# Patient Record
Sex: Female | Born: 1960 | Race: White | Hispanic: No | Marital: Married | Smoking: Never smoker
Health system: Southern US, Community
[De-identification: ages and names within clinical notes are randomized; demographics above are authoritative.]

## PROBLEM LIST (undated history)

## (undated) DIAGNOSIS — Z8679 Personal history of other diseases of the circulatory system: Secondary | ICD-10-CM

## (undated) DIAGNOSIS — G43909 Migraine, unspecified, not intractable, without status migrainosus: Secondary | ICD-10-CM

## (undated) DIAGNOSIS — E119 Type 2 diabetes mellitus without complications: Secondary | ICD-10-CM

## (undated) DIAGNOSIS — M2550 Pain in unspecified joint: Secondary | ICD-10-CM

## (undated) DIAGNOSIS — E3400 Carcinoid syndrome, unspecified: Secondary | ICD-10-CM

## (undated) DIAGNOSIS — H348192 Central retinal vein occlusion, unspecified eye, stable: Secondary | ICD-10-CM

## (undated) DIAGNOSIS — Z8619 Personal history of other infectious and parasitic diseases: Secondary | ICD-10-CM

## (undated) DIAGNOSIS — D490 Neoplasm of unspecified behavior of digestive system: Secondary | ICD-10-CM

## (undated) DIAGNOSIS — I951 Orthostatic hypotension: Secondary | ICD-10-CM

## (undated) DIAGNOSIS — M1990 Unspecified osteoarthritis, unspecified site: Secondary | ICD-10-CM

## (undated) DIAGNOSIS — H269 Unspecified cataract: Secondary | ICD-10-CM

## (undated) DIAGNOSIS — K7290 Hepatic failure, unspecified without coma: Secondary | ICD-10-CM

## (undated) DIAGNOSIS — M8580 Other specified disorders of bone density and structure, unspecified site: Secondary | ICD-10-CM

## (undated) DIAGNOSIS — K219 Gastro-esophageal reflux disease without esophagitis: Secondary | ICD-10-CM

## (undated) DIAGNOSIS — G4730 Sleep apnea, unspecified: Secondary | ICD-10-CM

## (undated) DIAGNOSIS — K829 Disease of gallbladder, unspecified: Secondary | ICD-10-CM

## (undated) DIAGNOSIS — K760 Fatty (change of) liver, not elsewhere classified: Secondary | ICD-10-CM

## (undated) DIAGNOSIS — F419 Anxiety disorder, unspecified: Secondary | ICD-10-CM

## (undated) DIAGNOSIS — K769 Liver disease, unspecified: Secondary | ICD-10-CM

## (undated) DIAGNOSIS — D3A8 Other benign neuroendocrine tumors: Secondary | ICD-10-CM

## (undated) DIAGNOSIS — N952 Postmenopausal atrophic vaginitis: Secondary | ICD-10-CM

## (undated) DIAGNOSIS — D869 Sarcoidosis, unspecified: Secondary | ICD-10-CM

## (undated) DIAGNOSIS — M549 Dorsalgia, unspecified: Secondary | ICD-10-CM

## (undated) DIAGNOSIS — K5900 Constipation, unspecified: Secondary | ICD-10-CM

## (undated) DIAGNOSIS — R7303 Prediabetes: Secondary | ICD-10-CM

## (undated) DIAGNOSIS — R0600 Dyspnea, unspecified: Secondary | ICD-10-CM

## (undated) DIAGNOSIS — I10 Essential (primary) hypertension: Secondary | ICD-10-CM

## (undated) DIAGNOSIS — E8940 Asymptomatic postprocedural ovarian failure: Secondary | ICD-10-CM

## (undated) DIAGNOSIS — R002 Palpitations: Secondary | ICD-10-CM

## (undated) HISTORY — DX: Neoplasm of unspecified behavior of digestive system: D49.0

## (undated) HISTORY — PX: NASAL SINUS SURGERY: SHX719

## (undated) HISTORY — DX: Sleep apnea, unspecified: G47.30

## (undated) HISTORY — DX: Dyspnea, unspecified: R06.00

## (undated) HISTORY — DX: Palpitations: R00.2

## (undated) HISTORY — DX: Personal history of other infectious and parasitic diseases: Z86.19

## (undated) HISTORY — PX: HERNIA REPAIR: SHX51

## (undated) HISTORY — PX: JOINT REPLACEMENT: SHX530

## (undated) HISTORY — PX: BREAST BIOPSY: SHX20

## (undated) HISTORY — DX: Dorsalgia, unspecified: M54.9

## (undated) HISTORY — PX: LAPAROSCOPIC HEPATECTOMY: SHX5897

## (undated) HISTORY — DX: Essential (primary) hypertension: I10

## (undated) HISTORY — DX: Fatty (change of) liver, not elsewhere classified: K76.0

## (undated) HISTORY — DX: Type 2 diabetes mellitus without complications: E11.9

## (undated) HISTORY — DX: Asymptomatic postprocedural ovarian failure: E89.40

## (undated) HISTORY — DX: Liver disease, unspecified: K76.9

## (undated) HISTORY — DX: Sarcoidosis, unspecified: D86.9

## (undated) HISTORY — DX: Personal history of other diseases of the circulatory system: Z86.79

## (undated) HISTORY — DX: Other specified disorders of bone density and structure, unspecified site: M85.80

## (undated) HISTORY — DX: Hepatic failure, unspecified without coma: K72.90

## (undated) HISTORY — DX: Central retinal vein occlusion, unspecified eye, stable: H34.8192

## (undated) HISTORY — DX: Other benign neuroendocrine tumors: D3A.8

## (undated) HISTORY — DX: Disease of gallbladder, unspecified: K82.9

## (undated) HISTORY — PX: LIVER SURGERY: SHX698

## (undated) HISTORY — DX: Pain in unspecified joint: M25.50

## (undated) HISTORY — PX: COSMETIC SURGERY: SHX468

## (undated) HISTORY — DX: Postmenopausal atrophic vaginitis: N95.2

## (undated) HISTORY — DX: Migraine, unspecified, not intractable, without status migrainosus: G43.909

## (undated) HISTORY — DX: Unspecified cataract: H26.9

## (undated) HISTORY — DX: Constipation, unspecified: K59.00

## (undated) HISTORY — DX: Orthostatic hypotension: I95.1

## (undated) HISTORY — PX: EYE SURGERY: SHX253

## (undated) HISTORY — PX: RECONSTRUCTION BREAST W/ LATISSIMUS DORSI FLAP: SUR1078

## (undated) HISTORY — DX: Prediabetes: R73.03

## (undated) HISTORY — PX: AUGMENTATION MAMMAPLASTY: SUR837

## (undated) HISTORY — DX: Unspecified osteoarthritis, unspecified site: M19.90

## (undated) HISTORY — DX: Gastro-esophageal reflux disease without esophagitis: K21.9

---
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
## 1997-04-21 ENCOUNTER — Ambulatory Visit (HOSPITAL_COMMUNITY): Admission: RE | Admit: 1997-04-21 | Discharge: 1997-04-21 | Payer: Self-pay | Admitting: Obstetrics and Gynecology

## 1997-05-18 ENCOUNTER — Other Ambulatory Visit: Admission: RE | Admit: 1997-05-18 | Discharge: 1997-05-18 | Payer: Self-pay | Admitting: Obstetrics & Gynecology

## 1997-06-12 ENCOUNTER — Inpatient Hospital Stay (HOSPITAL_COMMUNITY): Admission: AD | Admit: 1997-06-12 | Discharge: 1997-06-16 | Payer: Self-pay | Admitting: Obstetrics and Gynecology

## 1997-06-17 ENCOUNTER — Inpatient Hospital Stay (HOSPITAL_COMMUNITY): Admission: AD | Admit: 1997-06-17 | Discharge: 1997-06-17 | Payer: Self-pay | Admitting: Obstetrics and Gynecology

## 1997-06-17 ENCOUNTER — Encounter: Admission: RE | Admit: 1997-06-17 | Discharge: 1997-09-15 | Payer: Self-pay | Admitting: Obstetrics and Gynecology

## 1997-09-19 ENCOUNTER — Encounter (HOSPITAL_COMMUNITY): Admission: RE | Admit: 1997-09-19 | Discharge: 1997-12-18 | Payer: Self-pay | Admitting: Obstetrics and Gynecology

## 1997-12-14 ENCOUNTER — Other Ambulatory Visit: Admission: RE | Admit: 1997-12-14 | Discharge: 1997-12-14 | Payer: Self-pay | Admitting: Obstetrics and Gynecology

## 1999-02-11 HISTORY — PX: TUBAL LIGATION: SHX77

## 2000-07-05 ENCOUNTER — Emergency Department (HOSPITAL_COMMUNITY): Admission: EM | Admit: 2000-07-05 | Discharge: 2000-07-05 | Payer: Self-pay | Admitting: Emergency Medicine

## 2000-07-05 ENCOUNTER — Encounter: Payer: Self-pay | Admitting: Emergency Medicine

## 2002-02-10 HISTORY — PX: ABDOMINAL HYSTERECTOMY: SHX81

## 2002-02-10 HISTORY — PX: OOPHORECTOMY: SHX86

## 2004-02-11 HISTORY — PX: CHOLECYSTECTOMY: SHX55

## 2006-02-10 DIAGNOSIS — D869 Sarcoidosis, unspecified: Secondary | ICD-10-CM

## 2006-02-10 HISTORY — DX: Sarcoidosis, unspecified: D86.9

## 2006-02-10 HISTORY — PX: MASTECTOMY: SHX3

## 2008-07-11 ENCOUNTER — Encounter: Admission: RE | Admit: 2008-07-11 | Discharge: 2008-07-11 | Payer: Self-pay | Admitting: Rheumatology

## 2008-07-12 ENCOUNTER — Ambulatory Visit: Payer: Self-pay | Admitting: Genetic Counselor

## 2008-10-02 ENCOUNTER — Ambulatory Visit: Payer: Self-pay | Admitting: Internal Medicine

## 2008-10-15 ENCOUNTER — Inpatient Hospital Stay (HOSPITAL_COMMUNITY): Admission: EM | Admit: 2008-10-15 | Discharge: 2008-10-25 | Payer: Self-pay | Admitting: Emergency Medicine

## 2008-10-17 ENCOUNTER — Encounter (INDEPENDENT_AMBULATORY_CARE_PROVIDER_SITE_OTHER): Payer: Self-pay | Admitting: Interventional Radiology

## 2008-10-19 ENCOUNTER — Encounter (INDEPENDENT_AMBULATORY_CARE_PROVIDER_SITE_OTHER): Payer: Self-pay | Admitting: Internal Medicine

## 2008-10-20 ENCOUNTER — Ambulatory Visit: Payer: Self-pay | Admitting: Hematology & Oncology

## 2008-10-23 ENCOUNTER — Encounter: Payer: Self-pay | Admitting: Hematology & Oncology

## 2008-10-31 ENCOUNTER — Emergency Department (HOSPITAL_COMMUNITY): Admission: EM | Admit: 2008-10-31 | Discharge: 2008-10-31 | Payer: Self-pay | Admitting: Emergency Medicine

## 2008-11-02 ENCOUNTER — Ambulatory Visit: Payer: Self-pay | Admitting: Hematology & Oncology

## 2008-11-02 LAB — BASIC METABOLIC PANEL - CANCER CENTER ONLY
BUN, Bld: 14 mg/dL (ref 7–22)
CO2: 28 mEq/L (ref 18–33)
Chloride: 101 mEq/L (ref 98–108)
Creat: 1.5 mg/dl — ABNORMAL HIGH (ref 0.6–1.2)
Glucose, Bld: 101 mg/dL (ref 73–118)

## 2008-11-03 ENCOUNTER — Ambulatory Visit: Payer: Self-pay | Admitting: Internal Medicine

## 2008-11-03 ENCOUNTER — Ambulatory Visit (HOSPITAL_COMMUNITY): Admission: RE | Admit: 2008-11-03 | Discharge: 2008-11-03 | Payer: Self-pay | Admitting: Oncology

## 2008-11-09 LAB — CHROMOGRANIN A: Chromogranin A: 94 ng/mL — ABNORMAL HIGH (ref 1.9–15.0)

## 2008-11-15 ENCOUNTER — Emergency Department (HOSPITAL_COMMUNITY): Admission: EM | Admit: 2008-11-15 | Discharge: 2008-11-15 | Payer: Self-pay | Admitting: Emergency Medicine

## 2008-11-21 ENCOUNTER — Ambulatory Visit: Payer: Self-pay | Admitting: Internal Medicine

## 2008-11-29 ENCOUNTER — Emergency Department (HOSPITAL_COMMUNITY): Admission: EM | Admit: 2008-11-29 | Discharge: 2008-11-29 | Payer: Self-pay | Admitting: Emergency Medicine

## 2008-12-22 ENCOUNTER — Ambulatory Visit: Payer: Self-pay | Admitting: Hematology & Oncology

## 2008-12-25 LAB — CBC WITH DIFFERENTIAL (CANCER CENTER ONLY)
BASO#: 0 10*3/uL (ref 0.0–0.2)
EOS%: 3.4 % (ref 0.0–7.0)
Eosinophils Absolute: 0.2 10*3/uL (ref 0.0–0.5)
HGB: 11.1 g/dL — ABNORMAL LOW (ref 11.6–15.9)
LYMPH#: 1.8 10*3/uL (ref 0.9–3.3)
NEUT#: 3.3 10*3/uL (ref 1.5–6.5)
RBC: 3.72 10*6/uL (ref 3.70–5.32)

## 2008-12-28 ENCOUNTER — Ambulatory Visit: Payer: Self-pay | Admitting: Internal Medicine

## 2008-12-29 LAB — COMPREHENSIVE METABOLIC PANEL
ALT: 20 U/L (ref 0–35)
AST: 18 U/L (ref 0–37)
Albumin: 4 g/dL (ref 3.5–5.2)
CO2: 25 mEq/L (ref 19–32)
Calcium: 8.7 mg/dL (ref 8.4–10.5)
Chloride: 103 mEq/L (ref 96–112)
Potassium: 3 mEq/L — ABNORMAL LOW (ref 3.5–5.3)
Total Protein: 6.9 g/dL (ref 6.0–8.3)

## 2009-01-12 ENCOUNTER — Ambulatory Visit: Payer: Self-pay | Admitting: Internal Medicine

## 2009-01-25 ENCOUNTER — Ambulatory Visit: Payer: Self-pay | Admitting: Hematology & Oncology

## 2009-01-26 LAB — CBC WITH DIFFERENTIAL (CANCER CENTER ONLY)
BASO%: 0.6 % (ref 0.0–2.0)
EOS%: 2.4 % (ref 0.0–7.0)
LYMPH#: 2.2 10*3/uL (ref 0.9–3.3)
MCHC: 34.4 g/dL (ref 32.0–36.0)
NEUT#: 6.2 10*3/uL (ref 1.5–6.5)
RDW: 12.7 % (ref 10.5–14.6)

## 2009-01-26 LAB — COMPREHENSIVE METABOLIC PANEL
Alkaline Phosphatase: 89 U/L (ref 39–117)
CO2: 24 mEq/L (ref 19–32)
Creatinine, Ser: 0.79 mg/dL (ref 0.40–1.20)
Glucose, Bld: 97 mg/dL (ref 70–99)
Total Bilirubin: 0.2 mg/dL — ABNORMAL LOW (ref 0.3–1.2)

## 2009-02-01 LAB — CHROMOGRANIN A: Chromogranin A: 4.2 ng/mL (ref 1.9–15.0)

## 2009-02-10 DIAGNOSIS — K7682 Hepatic encephalopathy: Secondary | ICD-10-CM

## 2009-02-10 DIAGNOSIS — D3A8 Other benign neuroendocrine tumors: Secondary | ICD-10-CM

## 2009-02-10 DIAGNOSIS — K7290 Hepatic failure, unspecified without coma: Secondary | ICD-10-CM

## 2009-02-10 DIAGNOSIS — I10 Essential (primary) hypertension: Secondary | ICD-10-CM

## 2009-02-10 HISTORY — DX: Other benign neuroendocrine tumors: D3A.8

## 2009-02-10 HISTORY — DX: Hepatic encephalopathy: K76.82

## 2009-02-10 HISTORY — DX: Hepatic failure, unspecified without coma: K72.90

## 2009-02-10 HISTORY — DX: Essential (primary) hypertension: I10

## 2009-03-01 ENCOUNTER — Ambulatory Visit: Payer: Self-pay | Admitting: Hematology & Oncology

## 2009-03-02 LAB — CBC WITH DIFFERENTIAL (CANCER CENTER ONLY)
BASO#: 0 10*3/uL (ref 0.0–0.2)
BASO%: 0.5 % (ref 0.0–2.0)
HCT: 34.9 % (ref 34.8–46.6)
HGB: 12.1 g/dL (ref 11.6–15.9)
LYMPH#: 2.4 10*3/uL (ref 0.9–3.3)
LYMPH%: 31.3 % (ref 14.0–48.0)
MCHC: 34.6 g/dL (ref 32.0–36.0)
MCV: 86 fL (ref 81–101)
MONO#: 0.4 10*3/uL (ref 0.1–0.9)
NEUT%: 59.8 % (ref 39.6–80.0)
RDW: 12.4 % (ref 10.5–14.6)
WBC: 7.8 10*3/uL (ref 3.9–10.0)

## 2009-03-06 LAB — CHROMOGRANIN A: Chromogranin A: 4.8 ng/mL (ref 1.9–15.0)

## 2009-03-06 LAB — COMPREHENSIVE METABOLIC PANEL
ALT: 46 U/L — ABNORMAL HIGH (ref 0–35)
BUN: 14 mg/dL (ref 6–23)
CO2: 23 mEq/L (ref 19–32)
Calcium: 9 mg/dL (ref 8.4–10.5)
Chloride: 101 mEq/L (ref 96–112)
Creatinine, Ser: 0.79 mg/dL (ref 0.40–1.20)
Total Bilirubin: 0.3 mg/dL (ref 0.3–1.2)

## 2009-03-06 LAB — LACTATE DEHYDROGENASE: LDH: 199 U/L (ref 94–250)

## 2009-03-08 ENCOUNTER — Ambulatory Visit: Payer: Self-pay | Admitting: Internal Medicine

## 2009-04-24 ENCOUNTER — Ambulatory Visit: Payer: Self-pay | Admitting: Internal Medicine

## 2009-05-22 ENCOUNTER — Ambulatory Visit: Payer: Self-pay | Admitting: Internal Medicine

## 2009-10-09 ENCOUNTER — Other Ambulatory Visit: Admission: RE | Admit: 2009-10-09 | Discharge: 2009-10-09 | Payer: Self-pay | Admitting: Internal Medicine

## 2009-10-09 ENCOUNTER — Ambulatory Visit: Payer: Self-pay | Admitting: Internal Medicine

## 2009-11-05 ENCOUNTER — Ambulatory Visit: Payer: Self-pay | Admitting: Internal Medicine

## 2009-11-06 ENCOUNTER — Encounter: Admission: RE | Admit: 2009-11-06 | Discharge: 2009-11-06 | Payer: Self-pay | Admitting: Internal Medicine

## 2009-11-20 ENCOUNTER — Ambulatory Visit: Payer: Self-pay | Admitting: Internal Medicine

## 2009-11-30 ENCOUNTER — Ambulatory Visit: Payer: Self-pay | Admitting: Hematology & Oncology

## 2009-12-05 LAB — CHROMOGRANIN A: Chromogranin A: 44 ng/mL — ABNORMAL HIGH (ref 1.9–15.0)

## 2009-12-19 ENCOUNTER — Encounter (HOSPITAL_COMMUNITY)
Admission: RE | Admit: 2009-12-19 | Discharge: 2010-02-08 | Payer: Self-pay | Source: Home / Self Care | Attending: Hematology & Oncology | Admitting: Hematology & Oncology

## 2010-01-22 ENCOUNTER — Ambulatory Visit: Payer: Self-pay | Admitting: Hematology & Oncology

## 2010-01-23 LAB — CBC WITH DIFFERENTIAL (CANCER CENTER ONLY)
BASO#: 0 10*3/uL (ref 0.0–0.2)
BASO%: 0.5 % (ref 0.0–2.0)
Eosinophils Absolute: 0.3 10*3/uL (ref 0.0–0.5)
HCT: 35.1 % (ref 34.8–46.6)
HGB: 11.8 g/dL (ref 11.6–15.9)
LYMPH#: 1.8 10*3/uL (ref 0.9–3.3)
LYMPH%: 26.3 % (ref 14.0–48.0)
MCH: 28.9 pg (ref 26.0–34.0)
MCHC: 33.5 g/dL (ref 32.0–36.0)
MCV: 86 fL (ref 81–101)
MONO#: 0.4 10*3/uL (ref 0.1–0.9)
MONO%: 5.8 % (ref 0.0–13.0)
NEUT#: 4.4 10*3/uL (ref 1.5–6.5)
NEUT%: 62.9 % (ref 39.6–80.0)
Platelets: 210 10*3/uL (ref 145–400)
RBC: 4.07 10*6/uL (ref 3.70–5.32)
RDW: 13.5 % (ref 10.5–14.6)
WBC: 7 10*3/uL (ref 3.9–10.0)

## 2010-01-26 LAB — COMPREHENSIVE METABOLIC PANEL
ALT: 35 U/L (ref 0–35)
AST: 37 U/L (ref 0–37)
Albumin: 3.7 g/dL (ref 3.5–5.2)
Alkaline Phosphatase: 102 U/L (ref 39–117)
BUN: 15 mg/dL (ref 6–23)
CO2: 27 mEq/L (ref 19–32)
Calcium: 8.9 mg/dL (ref 8.4–10.5)
Chloride: 108 mEq/L (ref 96–112)
Creatinine, Ser: 0.67 mg/dL (ref 0.40–1.20)
Glucose, Bld: 155 mg/dL — ABNORMAL HIGH (ref 70–99)
Potassium: 4 mEq/L (ref 3.5–5.3)
Sodium: 141 mEq/L (ref 135–145)
Total Bilirubin: 0.3 mg/dL (ref 0.3–1.2)
Total Protein: 6.8 g/dL (ref 6.0–8.3)

## 2010-01-26 LAB — CHROMOGRANIN A: Chromogranin A: 6.8 ng/mL (ref 1.9–15.0)

## 2010-03-03 ENCOUNTER — Encounter: Payer: Self-pay | Admitting: Internal Medicine

## 2010-03-15 ENCOUNTER — Encounter (HOSPITAL_BASED_OUTPATIENT_CLINIC_OR_DEPARTMENT_OTHER): Payer: BC Managed Care – PPO | Admitting: Hematology & Oncology

## 2010-03-15 DIAGNOSIS — C7A00 Malignant carcinoid tumor of unspecified site: Secondary | ICD-10-CM

## 2010-03-15 DIAGNOSIS — C787 Secondary malignant neoplasm of liver and intrahepatic bile duct: Secondary | ICD-10-CM

## 2010-03-20 ENCOUNTER — Encounter (HOSPITAL_BASED_OUTPATIENT_CLINIC_OR_DEPARTMENT_OTHER): Payer: BC Managed Care – PPO | Admitting: Hematology & Oncology

## 2010-03-20 ENCOUNTER — Other Ambulatory Visit: Payer: Self-pay | Admitting: Hematology & Oncology

## 2010-03-20 DIAGNOSIS — C7A00 Malignant carcinoid tumor of unspecified site: Secondary | ICD-10-CM

## 2010-03-20 DIAGNOSIS — C787 Secondary malignant neoplasm of liver and intrahepatic bile duct: Secondary | ICD-10-CM

## 2010-03-20 DIAGNOSIS — C7A098 Malignant carcinoid tumors of other sites: Secondary | ICD-10-CM

## 2010-03-20 DIAGNOSIS — C254 Malignant neoplasm of endocrine pancreas: Secondary | ICD-10-CM

## 2010-03-20 LAB — CBC WITH DIFFERENTIAL (CANCER CENTER ONLY)
BASO#: 0 10*3/uL (ref 0.0–0.2)
BASO%: 0.4 % (ref 0.0–2.0)
EOS%: 2.5 % (ref 0.0–7.0)
LYMPH#: 1.9 10*3/uL (ref 0.9–3.3)
MCH: 29.2 pg (ref 26.0–34.0)
MCHC: 33.2 g/dL (ref 32.0–36.0)
MONO%: 5.7 % (ref 0.0–13.0)
NEUT#: 4.5 10*3/uL (ref 1.5–6.5)
Platelets: 210 10*3/uL (ref 145–400)
RDW: 13.2 % (ref 10.5–14.6)

## 2010-03-24 LAB — COMPREHENSIVE METABOLIC PANEL
ALT: 31 U/L (ref 0–35)
AST: 32 U/L (ref 0–37)
Albumin: 3.8 g/dL (ref 3.5–5.2)
Alkaline Phosphatase: 104 U/L (ref 39–117)
BUN: 11 mg/dL (ref 6–23)
Chloride: 104 mEq/L (ref 96–112)
Potassium: 3.7 mEq/L (ref 3.5–5.3)

## 2010-03-24 LAB — CHROMOGRANIN A: Chromogranin A: 2.8 ng/mL (ref 1.9–15.0)

## 2010-04-09 ENCOUNTER — Ambulatory Visit (INDEPENDENT_AMBULATORY_CARE_PROVIDER_SITE_OTHER): Payer: BC Managed Care – PPO | Admitting: Internal Medicine

## 2010-04-09 DIAGNOSIS — J069 Acute upper respiratory infection, unspecified: Secondary | ICD-10-CM

## 2010-04-11 ENCOUNTER — Encounter (HOSPITAL_BASED_OUTPATIENT_CLINIC_OR_DEPARTMENT_OTHER): Payer: BC Managed Care – PPO | Admitting: Hematology & Oncology

## 2010-04-11 DIAGNOSIS — C787 Secondary malignant neoplasm of liver and intrahepatic bile duct: Secondary | ICD-10-CM

## 2010-04-11 DIAGNOSIS — C7A00 Malignant carcinoid tumor of unspecified site: Secondary | ICD-10-CM

## 2010-05-15 ENCOUNTER — Other Ambulatory Visit: Payer: Self-pay | Admitting: Hematology & Oncology

## 2010-05-15 ENCOUNTER — Encounter (HOSPITAL_BASED_OUTPATIENT_CLINIC_OR_DEPARTMENT_OTHER): Payer: BC Managed Care – PPO | Admitting: Hematology & Oncology

## 2010-05-15 DIAGNOSIS — C787 Secondary malignant neoplasm of liver and intrahepatic bile duct: Secondary | ICD-10-CM

## 2010-05-15 DIAGNOSIS — C7A00 Malignant carcinoid tumor of unspecified site: Secondary | ICD-10-CM

## 2010-05-15 LAB — CBC WITH DIFFERENTIAL (CANCER CENTER ONLY)
Eosinophils Absolute: 0.1 10*3/uL (ref 0.0–0.5)
LYMPH#: 2.2 10*3/uL (ref 0.9–3.3)
MONO#: 0.5 10*3/uL (ref 0.1–0.9)
MONO%: 7.2 % (ref 0.0–13.0)
NEUT#: 4.2 10*3/uL (ref 1.5–6.5)
Platelets: 216 10*3/uL (ref 145–400)
RBC: 4.27 10*6/uL (ref 3.70–5.32)
WBC: 7.1 10*3/uL (ref 3.9–10.0)

## 2010-05-16 LAB — COMPREHENSIVE METABOLIC PANEL
AST: 23 U/L (ref 0–37)
Albumin: 3.9 g/dL (ref 3.5–5.2)
CO2: 28 mEq/L (ref 19–32)
Calcium: 9.5 mg/dL (ref 8.4–10.5)
Creatinine, Ser: 1.1 mg/dL (ref 0.4–1.2)
GFR calc Af Amer: >60 mL/min (ref >60)
GFR calc non Af Amer: 53 mL/min — ABNORMAL LOW (ref >60)
Total Protein: 8.1 g/dL (ref 6.0–8.3)

## 2010-05-16 LAB — URINALYSIS, ROUTINE W REFLEX MICROSCOPIC
Bilirubin Urine: NEGATIVE
Bilirubin Urine: NEGATIVE
Glucose, UA: NEGATIVE mg/dL
Glucose, UA: NEGATIVE mg/dL
Nitrite: NEGATIVE
Nitrite: NEGATIVE
Protein, ur: 100 mg/dL — AB
Protein, ur: NEGATIVE mg/dL
Specific Gravity, Urine: 1.017 (ref 1.005–1.030)
Urobilinogen, UA: 0.2 mg/dL (ref 0.0–1.0)

## 2010-05-16 LAB — CBC
HCT: 33.2 % — ABNORMAL LOW (ref 36.0–46.0)
Hemoglobin: 11.2 g/dL — ABNORMAL LOW (ref 12.0–15.0)
MCHC: 33.9 g/dL (ref 30.0–36.0)
MCV: 88.3 fL (ref 78.0–100.0)
Platelets: 365 10*3/uL (ref 150–400)
RBC: 3.75 MIL/uL — ABNORMAL LOW (ref 3.87–5.11)
RDW: 14.7 % (ref 11.5–15.5)
WBC: 10.2 10*3/uL (ref 4.0–10.5)

## 2010-05-16 LAB — URINE MICROSCOPIC-ADD ON

## 2010-05-16 LAB — DIFFERENTIAL
Basophils Relative: 0 % (ref 0–1)
Eosinophils Relative: 5 % (ref 0–5)
Lymphocytes Relative: 27 % (ref 12–46)
Lymphs Abs: 2.7 10*3/uL (ref 0.7–4.0)
Monocytes Relative: 6 % (ref 3–12)

## 2010-05-16 LAB — URINE CULTURE

## 2010-05-17 LAB — DIFFERENTIAL
Basophils Relative: 1 % (ref 0–1)
Basophils Relative: 0 % (ref 0–1)
Basophils Relative: 0 % (ref 0–1)
Basophils Relative: 0 % (ref 0–1)
Eosinophils Absolute: 0.1 10*3/uL (ref 0.0–0.7)
Eosinophils Absolute: 0 10*3/uL (ref 0.0–0.7)
Eosinophils Absolute: 0.1 10*3/uL (ref 0.0–0.7)
Eosinophils Absolute: 0.3 10*3/uL (ref 0.0–0.7)
Eosinophils Relative: 0 % (ref 0–5)
Eosinophils Relative: 0 % (ref 0–5)
Eosinophils Relative: 1 % (ref 0–5)
Lymphocytes Relative: 6 % — ABNORMAL LOW (ref 12–46)
Lymphocytes Relative: 14 % (ref 12–46)
Lymphocytes Relative: 16 % (ref 12–46)
Lymphocytes Relative: 19 % (ref 12–46)
Lymphs Abs: 1.4 10*3/uL (ref 0.7–4.0)
Lymphs Abs: 1.4 10*3/uL (ref 0.7–4.0)
Lymphs Abs: 2.6 10*3/uL (ref 0.7–4.0)
Monocytes Absolute: 0.8 10*3/uL (ref 0.1–1.0)
Monocytes Absolute: 0.9 10*3/uL (ref 0.1–1.0)
Monocytes Relative: 3 % (ref 3–12)
Monocytes Relative: 10 % (ref 3–12)
Monocytes Relative: 7 % (ref 3–12)
Neutrophils Relative %: 90 % — ABNORMAL HIGH (ref 43–77)
Neutrophils Relative %: 72 % (ref 43–77)
Neutrophils Relative %: 74 % (ref 43–77)
Neutrophils Relative %: 75 % (ref 43–77)

## 2010-05-17 LAB — COMPREHENSIVE METABOLIC PANEL
ALT: 27 U/L (ref 0–35)
ALT: 34 U/L (ref 0–35)
ALT: 44 U/L — ABNORMAL HIGH (ref 0–35)
AST: 30 U/L (ref 0–37)
AST: 32 U/L (ref 0–37)
AST: 47 U/L — ABNORMAL HIGH (ref 0–37)
Albumin: 2.4 g/dL — ABNORMAL LOW (ref 3.5–5.2)
Albumin: 3.3 g/dL — ABNORMAL LOW (ref 3.5–5.2)
Alkaline Phosphatase: 120 U/L — ABNORMAL HIGH (ref 39–117)
CO2: 24 mEq/L (ref 19–32)
CO2: 29 mEq/L (ref 19–32)
Calcium: 7.8 mg/dL — ABNORMAL LOW (ref 8.4–10.5)
Chloride: 102 mEq/L (ref 96–112)
Creatinine, Ser: 0.77 mg/dL (ref 0.4–1.2)
Creatinine, Ser: 2.26 mg/dL — ABNORMAL HIGH (ref 0.4–1.2)
GFR calc Af Amer: 28 mL/min — ABNORMAL LOW (ref >60)
GFR calc Af Amer: >60 mL/min (ref >60)
GFR calc Af Amer: >60 mL/min (ref >60)
GFR calc non Af Amer: 23 mL/min — ABNORMAL LOW (ref >60)
GFR calc non Af Amer: >60 mL/min (ref >60)
Glucose, Bld: 100 mg/dL — ABNORMAL HIGH (ref 70–99)
Potassium: 3.1 mEq/L — ABNORMAL LOW (ref 3.5–5.1)
Sodium: 135 mEq/L (ref 135–145)
Sodium: 136 mEq/L (ref 135–145)
Sodium: 139 mEq/L (ref 135–145)
Total Bilirubin: 1.5 mg/dL — ABNORMAL HIGH (ref 0.3–1.2)
Total Protein: 5.9 g/dL — ABNORMAL LOW (ref 6.0–8.3)
Total Protein: 7.6 g/dL (ref 6.0–8.3)

## 2010-05-17 LAB — CBC
HCT: 32.6 % — ABNORMAL LOW (ref 36.0–46.0)
HCT: 23.5 % — ABNORMAL LOW (ref 36.0–46.0)
HCT: 24.4 % — ABNORMAL LOW (ref 36.0–46.0)
HCT: 24.6 % — ABNORMAL LOW (ref 36.0–46.0)
HCT: 24.9 % — ABNORMAL LOW (ref 36.0–46.0)
HCT: 25.3 % — ABNORMAL LOW (ref 36.0–46.0)
HCT: 27.7 % — ABNORMAL LOW (ref 36.0–46.0)
HCT: 28.2 % — ABNORMAL LOW (ref 36.0–46.0)
HCT: 30.5 % — ABNORMAL LOW (ref 36.0–46.0)
HCT: 33.8 % — ABNORMAL LOW (ref 36.0–46.0)
Hemoglobin: 11.2 g/dL — ABNORMAL LOW (ref 12.0–15.0)
Hemoglobin: 10.8 g/dL — ABNORMAL LOW (ref 12.0–15.0)
Hemoglobin: 11.6 g/dL — ABNORMAL LOW (ref 12.0–15.0)
Hemoglobin: 8.1 g/dL — ABNORMAL LOW (ref 12.0–15.0)
Hemoglobin: 8.5 g/dL — ABNORMAL LOW (ref 12.0–15.0)
Hemoglobin: 8.5 g/dL — ABNORMAL LOW (ref 12.0–15.0)
Hemoglobin: 8.5 g/dL — ABNORMAL LOW (ref 12.0–15.0)
Hemoglobin: 8.6 g/dL — ABNORMAL LOW (ref 12.0–15.0)
Hemoglobin: 9.4 g/dL — ABNORMAL LOW (ref 12.0–15.0)
Hemoglobin: 9.8 g/dL — ABNORMAL LOW (ref 12.0–15.0)
MCHC: 34.3 g/dL (ref 30.0–36.0)
MCHC: 34.1 g/dL (ref 30.0–36.0)
MCHC: 34.2 g/dL (ref 30.0–36.0)
MCHC: 34.2 g/dL (ref 30.0–36.0)
MCHC: 34.4 g/dL (ref 30.0–36.0)
MCHC: 34.4 g/dL (ref 30.0–36.0)
MCHC: 34.7 g/dL (ref 30.0–36.0)
MCHC: 34.8 g/dL (ref 30.0–36.0)
MCHC: 34.9 g/dL (ref 30.0–36.0)
MCHC: 35.3 g/dL (ref 30.0–36.0)
MCV: 87.8 fL (ref 78.0–100.0)
MCV: 89.6 fL (ref 78.0–100.0)
MCV: 90.1 fL (ref 78.0–100.0)
MCV: 90.3 fL (ref 78.0–100.0)
MCV: 90.6 fL (ref 78.0–100.0)
MCV: 90.8 fL (ref 78.0–100.0)
MCV: 90.9 fL (ref 78.0–100.0)
MCV: 91.1 fL (ref 78.0–100.0)
MCV: 91.2 fL (ref 78.0–100.0)
MCV: 91.9 fL (ref 78.0–100.0)
Platelets: 355 10*3/uL (ref 150–400)
Platelets: 211 10*3/uL (ref 150–400)
Platelets: 214 10*3/uL (ref 150–400)
Platelets: 229 10*3/uL (ref 150–400)
Platelets: 252 10*3/uL (ref 150–400)
Platelets: 268 10*3/uL (ref 150–400)
Platelets: 269 10*3/uL (ref 150–400)
Platelets: 271 10*3/uL (ref 150–400)
Platelets: 388 10*3/uL (ref 150–400)
RBC: 3.71 MIL/uL — ABNORMAL LOW (ref 3.87–5.11)
RBC: 2.6 MIL/uL — ABNORMAL LOW (ref 3.87–5.11)
RBC: 2.69 MIL/uL — ABNORMAL LOW (ref 3.87–5.11)
RBC: 2.71 MIL/uL — ABNORMAL LOW (ref 3.87–5.11)
RBC: 2.77 MIL/uL — ABNORMAL LOW (ref 3.87–5.11)
RBC: 2.78 MIL/uL — ABNORMAL LOW (ref 3.87–5.11)
RBC: 3.01 MIL/uL — ABNORMAL LOW (ref 3.87–5.11)
RBC: 3.09 MIL/uL — ABNORMAL LOW (ref 3.87–5.11)
RBC: 3.37 MIL/uL — ABNORMAL LOW (ref 3.87–5.11)
RBC: 3.72 MIL/uL — ABNORMAL LOW (ref 3.87–5.11)
RDW: 14 % (ref 11.5–15.5)
RDW: 13.8 % (ref 11.5–15.5)
RDW: 13.8 % (ref 11.5–15.5)
RDW: 13.9 % (ref 11.5–15.5)
RDW: 13.9 % (ref 11.5–15.5)
RDW: 13.9 % (ref 11.5–15.5)
RDW: 14.1 % (ref 11.5–15.5)
RDW: 14.3 % (ref 11.5–15.5)
RDW: 14.3 % (ref 11.5–15.5)
RDW: 14.5 % (ref 11.5–15.5)
WBC: 22.5 10*3/uL — ABNORMAL HIGH (ref 4.0–10.5)
WBC: 10 10*3/uL (ref 4.0–10.5)
WBC: 11.7 10*3/uL — ABNORMAL HIGH (ref 4.0–10.5)
WBC: 13.9 10*3/uL — ABNORMAL HIGH (ref 4.0–10.5)
WBC: 6.5 10*3/uL (ref 4.0–10.5)
WBC: 7.7 10*3/uL (ref 4.0–10.5)
WBC: 8.6 10*3/uL (ref 4.0–10.5)
WBC: 9.1 10*3/uL (ref 4.0–10.5)
WBC: 9.6 10*3/uL (ref 4.0–10.5)

## 2010-05-17 LAB — URINALYSIS, ROUTINE W REFLEX MICROSCOPIC
Bilirubin Urine: NEGATIVE
Bilirubin Urine: NEGATIVE
Glucose, UA: NEGATIVE mg/dL
Ketones, ur: NEGATIVE mg/dL
Ketones, ur: NEGATIVE mg/dL
Leukocytes, UA: NEGATIVE
Leukocytes, UA: NEGATIVE
Nitrite: NEGATIVE
Nitrite: NEGATIVE
Nitrite: NEGATIVE
Protein, ur: 30 mg/dL — AB
Protein, ur: NEGATIVE mg/dL
Protein, ur: NEGATIVE mg/dL
Specific Gravity, Urine: 1.008 (ref 1.005–1.030)
Urobilinogen, UA: 0.2 mg/dL (ref 0.0–1.0)
Urobilinogen, UA: 0.2 mg/dL (ref 0.0–1.0)

## 2010-05-17 LAB — CLOSTRIDIUM DIFFICILE EIA

## 2010-05-17 LAB — BASIC METABOLIC PANEL
BUN: 11 mg/dL (ref 6–23)
BUN: 11 mg/dL (ref 6–23)
BUN: 12 mg/dL (ref 6–23)
BUN: 13 mg/dL (ref 6–23)
CO2: 24 mEq/L (ref 19–32)
CO2: 20 mEq/L (ref 19–32)
CO2: 22 mEq/L (ref 19–32)
CO2: 24 mEq/L (ref 19–32)
CO2: 25 mEq/L (ref 19–32)
Chloride: 99 mEq/L (ref 96–112)
Chloride: 105 mEq/L (ref 96–112)
Chloride: 107 mEq/L (ref 96–112)
Chloride: 109 mEq/L (ref 96–112)
Chloride: 110 mEq/L (ref 96–112)
Creatinine, Ser: 1.65 mg/dL — ABNORMAL HIGH (ref 0.4–1.2)
Creatinine, Ser: 1.99 mg/dL — ABNORMAL HIGH (ref 0.4–1.2)
GFR calc Af Amer: 40 mL/min — ABNORMAL LOW (ref >60)
GFR calc Af Amer: 24 mL/min — ABNORMAL LOW (ref >60)
GFR calc Af Amer: 29 mL/min — ABNORMAL LOW (ref >60)
GFR calc non Af Amer: 24 mL/min — ABNORMAL LOW (ref >60)
GFR calc non Af Amer: 26 mL/min — ABNORMAL LOW (ref >60)
Glucose, Bld: 129 mg/dL — ABNORMAL HIGH (ref 70–99)
Glucose, Bld: 80 mg/dL (ref 70–99)
Glucose, Bld: 81 mg/dL (ref 70–99)
Glucose, Bld: 85 mg/dL (ref 70–99)
Glucose, Bld: 88 mg/dL (ref 70–99)
Glucose, Bld: 94 mg/dL (ref 70–99)
Potassium: 3.2 mEq/L — ABNORMAL LOW (ref 3.5–5.1)
Potassium: 3.6 mEq/L (ref 3.5–5.1)
Potassium: 3.9 mEq/L (ref 3.5–5.1)
Potassium: 4.3 mEq/L (ref 3.5–5.1)
Potassium: 4.6 mEq/L (ref 3.5–5.1)
Sodium: 136 mEq/L (ref 135–145)
Sodium: 136 mEq/L (ref 135–145)
Sodium: 138 mEq/L (ref 135–145)

## 2010-05-17 LAB — CULTURE, BLOOD (ROUTINE X 2)

## 2010-05-17 LAB — MAGNESIUM: Magnesium: 1.8 mg/dL (ref 1.5–2.5)

## 2010-05-17 LAB — HEMOGLOBIN AND HEMATOCRIT, BLOOD
HCT: 25.3 % — ABNORMAL LOW (ref 36.0–46.0)
Hemoglobin: 8.7 g/dL — ABNORMAL LOW (ref 12.0–15.0)

## 2010-05-17 LAB — HEMOCCULT GUIAC POC 1CARD (OFFICE): Fecal Occult Bld: NEGATIVE

## 2010-05-17 LAB — PROTIME-INR
INR: 1.2 (ref 0.00–1.49)
Prothrombin Time: 15.1 seconds (ref 11.6–15.2)

## 2010-05-17 LAB — RENAL FUNCTION PANEL
Albumin: 2.2 g/dL — ABNORMAL LOW (ref 3.5–5.2)
BUN: 13 mg/dL (ref 6–23)
CO2: 22 mEq/L (ref 19–32)
Calcium: 8 mg/dL — ABNORMAL LOW (ref 8.4–10.5)
Calcium: 8.2 mg/dL — ABNORMAL LOW (ref 8.4–10.5)
Creatinine, Ser: 2.03 mg/dL — ABNORMAL HIGH (ref 0.4–1.2)
GFR calc Af Amer: 23 mL/min — ABNORMAL LOW (ref >60)
GFR calc Af Amer: 31 mL/min — ABNORMAL LOW (ref >60)
GFR calc non Af Amer: 19 mL/min — ABNORMAL LOW (ref >60)
GFR calc non Af Amer: 26 mL/min — ABNORMAL LOW (ref >60)
Phosphorus: 3.2 mg/dL (ref 2.3–4.6)
Sodium: 135 mEq/L (ref 135–145)

## 2010-05-17 LAB — URINE MICROSCOPIC-ADD ON

## 2010-05-17 LAB — STOOL CULTURE

## 2010-05-17 LAB — CEA: CEA: 0.8 ng/mL (ref 0.0–5.0)

## 2010-05-17 LAB — URINE CULTURE
Colony Count: NO GROWTH
Colony Count: NO GROWTH
Culture: NO GROWTH
Special Requests: POSITIVE

## 2010-05-17 LAB — APTT: aPTT: 40 seconds — ABNORMAL HIGH (ref 24–37)

## 2010-05-17 LAB — IRON AND TIBC: UIBC: 156 ug/dL

## 2010-05-17 LAB — FERRITIN: Ferritin: 382 ng/mL — ABNORMAL HIGH (ref 10–291)

## 2010-05-17 LAB — OVA AND PARASITE EXAMINATION

## 2010-05-18 LAB — COMPREHENSIVE METABOLIC PANEL
ALT: 32 U/L (ref 0–35)
AST: 24 U/L (ref 0–37)
BUN: 17 mg/dL (ref 6–23)
CO2: 25 mEq/L (ref 19–32)
Calcium: 9 mg/dL (ref 8.4–10.5)
Chloride: 105 mEq/L (ref 96–112)
Creatinine, Ser: 0.89 mg/dL (ref 0.40–1.20)
Total Bilirubin: 0.5 mg/dL (ref 0.3–1.2)

## 2010-05-18 LAB — CHROMOGRANIN A: Chromogranin A: 7.8 ng/mL (ref 1.9–15.0)

## 2010-05-18 LAB — LACTATE DEHYDROGENASE: LDH: 198 U/L (ref 94–250)

## 2010-05-23 ENCOUNTER — Ambulatory Visit: Payer: Self-pay | Admitting: Internal Medicine

## 2010-06-12 ENCOUNTER — Encounter (HOSPITAL_BASED_OUTPATIENT_CLINIC_OR_DEPARTMENT_OTHER): Payer: BC Managed Care – PPO | Admitting: Hematology & Oncology

## 2010-06-12 DIAGNOSIS — C7A00 Malignant carcinoid tumor of unspecified site: Secondary | ICD-10-CM

## 2010-06-12 DIAGNOSIS — C787 Secondary malignant neoplasm of liver and intrahepatic bile duct: Secondary | ICD-10-CM

## 2010-06-22 ENCOUNTER — Emergency Department (HOSPITAL_BASED_OUTPATIENT_CLINIC_OR_DEPARTMENT_OTHER)
Admission: EM | Admit: 2010-06-22 | Discharge: 2010-06-22 | Disposition: A | Discharge status: Home / Self Care | Payer: BC Managed Care – PPO | Attending: Emergency Medicine | Admitting: Emergency Medicine

## 2010-06-22 DIAGNOSIS — F341 Dysthymic disorder: Secondary | ICD-10-CM | POA: Insufficient documentation

## 2010-06-22 DIAGNOSIS — I10 Essential (primary) hypertension: Secondary | ICD-10-CM | POA: Insufficient documentation

## 2010-06-22 DIAGNOSIS — K219 Gastro-esophageal reflux disease without esophagitis: Secondary | ICD-10-CM | POA: Insufficient documentation

## 2010-06-22 DIAGNOSIS — L089 Local infection of the skin and subcutaneous tissue, unspecified: Secondary | ICD-10-CM | POA: Insufficient documentation

## 2010-06-24 ENCOUNTER — Ambulatory Visit (INDEPENDENT_AMBULATORY_CARE_PROVIDER_SITE_OTHER): Payer: BC Managed Care – PPO | Admitting: Internal Medicine

## 2010-06-24 ENCOUNTER — Encounter: Payer: Self-pay | Admitting: Internal Medicine

## 2010-06-24 VITALS — BP 130/78 | HR 82 | Temp 98.4°F

## 2010-06-24 DIAGNOSIS — L259 Unspecified contact dermatitis, unspecified cause: Secondary | ICD-10-CM

## 2010-06-24 DIAGNOSIS — L309 Dermatitis, unspecified: Secondary | ICD-10-CM

## 2010-06-24 MED ORDER — TRIAMCINOLONE ACETONIDE 0.1 % EX CREA: TOPICAL_CREAM | Freq: Three times a day (TID) | CUTANEOUS | Status: DC

## 2010-06-24 NOTE — Patient Instructions (Signed)
Use cream as directed. Call if other symptoms develop. Stay well hydrated. If knee pain does not improve, see orthopedist

## 2010-06-24 NOTE — Progress Notes (Signed)
  Subjective:    Patient ID: Leah [name], female    DOB: 1961/01/16, 50 y.o.   MRN: [id number]  HPI  Micah Flesher to [facility] with raised red area on left lower back achy then itchy. Hx of sarcoidosis. Apparently had hard lump that increased in size over 24 hours. [facility] dx of insect bite or cellulitis. Started on Doxycycline which she took for 24 hours but by yesterday was nauseated. Went to [place] to visit someone in hospital and did not feel wel.l Went to ER at [facility]. Husband thought she irritable and fatigued. 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 Pt had lab work at [facility] including CBC, Cmet, PT, PTT, U/A and Ammonia level. Ammonia level was slightly high at 61. Pt was given IVF and observed over several hours.  Felt better. Doxycycline has been d/ced since evening of May 12. Feels better today. Liver functions were normal yesterday. Hx of sarcoidosis, HTN,GE reflux.    Review of Systems  Constitutional: Positive for appetite change.  HENT: Negative for neck stiffness.   Respiratory: Negative for cough, chest tightness, shortness of breath and wheezing.   Musculoskeletal: Positive for joint swelling.  Skin: Positive for rash.  Neurological: Negative for dizziness, syncope, speech difficulty, light-headedness and headaches.  Psychiatric/Behavioral: Negative for hallucinations, behavioral problems, confusion, dysphoric mood and agitation.       Objective:   Physical Exam  Constitutional: She appears well-nourished. No distress.  Musculoskeletal:       Generalized tenderness over left knee, no erythema, hursa to bear weight walking with cane.  Skin: Rash noted.       Itchy excoriated macular erythematous rash on left lower lateral back. No induration and no drainage.  Psychiatric: Her behavior is normal. Judgment and thought content normal. Her speech is rapid and/or pressured. Cognition  and memory are normal.          Assessment & Plan:  1- Left knee strian- likely to resolve with rest, ice and elevation. Xray done yesterday at [facility] negative. 2-Rash left lateral back looks like eczema but pt thinks it is a flare of Sarcoidosis. Will treat with Triamcinolone Cream 0.1% tid  For 7 days and if no improvement to dermatologist 3- Elevated ammonia level- cannot find previous ammonia level in old records at [facility], [facility] hospital or in this office. Affect is normal no scleral icterus. Will observe for now. Has appt next month at [facility] to see Dr. Flonnie Hailstone, surgeon who did her liver surgery. 4- HTN stable

## 2010-07-05 ENCOUNTER — Encounter (HOSPITAL_BASED_OUTPATIENT_CLINIC_OR_DEPARTMENT_OTHER): Payer: BC Managed Care – PPO | Admitting: Hematology & Oncology

## 2010-07-05 DIAGNOSIS — C787 Secondary malignant neoplasm of liver and intrahepatic bile duct: Secondary | ICD-10-CM

## 2010-07-05 DIAGNOSIS — C7A00 Malignant carcinoid tumor of unspecified site: Secondary | ICD-10-CM

## 2010-07-24 ENCOUNTER — Telehealth: Payer: Self-pay | Admitting: Internal Medicine

## 2010-07-24 NOTE — Telephone Encounter (Signed)
Written Rx to fax Effexor XR 150 mg #30 prn 1 year refill

## 2010-07-26 ENCOUNTER — Ambulatory Visit: Payer: BC Managed Care – PPO | Admitting: Internal Medicine

## 2010-07-31 ENCOUNTER — Other Ambulatory Visit: Payer: Self-pay | Admitting: Hematology & Oncology

## 2010-07-31 ENCOUNTER — Encounter (HOSPITAL_BASED_OUTPATIENT_CLINIC_OR_DEPARTMENT_OTHER): Payer: BC Managed Care – PPO | Admitting: Hematology & Oncology

## 2010-07-31 DIAGNOSIS — C787 Secondary malignant neoplasm of liver and intrahepatic bile duct: Secondary | ICD-10-CM

## 2010-07-31 DIAGNOSIS — C7A00 Malignant carcinoid tumor of unspecified site: Secondary | ICD-10-CM

## 2010-07-31 LAB — CBC WITH DIFFERENTIAL (CANCER CENTER ONLY)
BASO%: 0.3 % (ref 0.0–2.0)
Eosinophils Absolute: 0.2 10*3/uL (ref 0.0–0.5)
LYMPH#: 2.6 10*3/uL (ref 0.9–3.3)
LYMPH%: 29.7 % (ref 14.0–48.0)
MCV: 90 fL (ref 81–101)
MONO#: 0.6 10*3/uL (ref 0.1–0.9)
Platelets: 222 10*3/uL (ref 145–400)
RBC: 4.19 10*6/uL (ref 3.70–5.32)
RDW: 14 % (ref 11.1–15.7)
WBC: 8.9 10*3/uL (ref 3.9–10.0)

## 2010-08-06 LAB — COMPREHENSIVE METABOLIC PANEL
ALT: 26 U/L (ref 0–35)
Albumin: 4.3 g/dL (ref 3.5–5.2)
CO2: 26 mEq/L (ref 19–32)
Calcium: 8.8 mg/dL (ref 8.4–10.5)
Chloride: 103 mEq/L (ref 96–112)
Glucose, Bld: 139 mg/dL — ABNORMAL HIGH (ref 70–99)
Potassium: 3.6 mEq/L (ref 3.5–5.3)
Sodium: 139 mEq/L (ref 135–145)
Total Protein: 7.2 g/dL (ref 6.0–8.3)

## 2010-08-06 LAB — LACTATE DEHYDROGENASE: LDH: 217 U/L (ref 94–250)

## 2010-08-07 ENCOUNTER — Encounter (HOSPITAL_BASED_OUTPATIENT_CLINIC_OR_DEPARTMENT_OTHER): Payer: BC Managed Care – PPO | Admitting: Hematology & Oncology

## 2010-08-07 DIAGNOSIS — C7A00 Malignant carcinoid tumor of unspecified site: Secondary | ICD-10-CM

## 2010-08-07 DIAGNOSIS — C787 Secondary malignant neoplasm of liver and intrahepatic bile duct: Secondary | ICD-10-CM

## 2010-08-20 ENCOUNTER — Other Ambulatory Visit: Payer: BC Managed Care – PPO | Admitting: Internal Medicine

## 2010-08-20 DIAGNOSIS — Z0000 Encounter for general adult medical examination without abnormal findings: Secondary | ICD-10-CM

## 2010-08-20 DIAGNOSIS — I10 Essential (primary) hypertension: Secondary | ICD-10-CM

## 2010-08-20 LAB — LIPID PANEL
Cholesterol: 170 mg/dL (ref 0–200)
HDL: 53 mg/dL (ref >39)
Total CHOL/HDL Ratio: 3.2 Ratio
Triglycerides: 74 mg/dL (ref <150)
VLDL: 15 mg/dL (ref 0–40)

## 2010-08-20 LAB — CBC WITH DIFFERENTIAL/PLATELET
Basophils Relative: 0 % (ref 0–1)
Eosinophils Absolute: 0.1 10*3/uL (ref 0.0–0.7)
Eosinophils Relative: 2 % (ref 0–5)
Hemoglobin: 11.7 g/dL — ABNORMAL LOW (ref 12.0–15.0)
Lymphs Abs: 1.9 10*3/uL (ref 0.7–4.0)
MCH: 30 pg (ref 26.0–34.0)
MCHC: 32.3 g/dL (ref 30.0–36.0)
MCV: 92.8 fL (ref 78.0–100.0)
Monocytes Absolute: 0.4 10*3/uL (ref 0.1–1.0)
Monocytes Relative: 6 % (ref 3–12)
RBC: 3.9 MIL/uL (ref 3.87–5.11)

## 2010-08-20 LAB — COMPREHENSIVE METABOLIC PANEL
BUN: 19 mg/dL (ref 6–23)
CO2: 25 mEq/L (ref 19–32)
Creat: 0.71 mg/dL (ref 0.50–1.10)
Glucose, Bld: 93 mg/dL (ref 70–99)
Total Bilirubin: 0.4 mg/dL (ref 0.3–1.2)
Total Protein: 6.5 g/dL (ref 6.0–8.3)

## 2010-08-22 ENCOUNTER — Encounter: Payer: Self-pay | Admitting: Internal Medicine

## 2010-08-22 ENCOUNTER — Ambulatory Visit (INDEPENDENT_AMBULATORY_CARE_PROVIDER_SITE_OTHER): Payer: BC Managed Care – PPO | Admitting: Internal Medicine

## 2010-08-22 VITALS — BP 118/70 | HR 72 | Temp 98.9°F | Ht 60.0 in | Wt 198.0 lb

## 2010-08-22 DIAGNOSIS — M1990 Unspecified osteoarthritis, unspecified site: Secondary | ICD-10-CM

## 2010-08-22 DIAGNOSIS — C7A00 Malignant carcinoid tumor of unspecified site: Secondary | ICD-10-CM

## 2010-08-22 DIAGNOSIS — I10 Essential (primary) hypertension: Secondary | ICD-10-CM

## 2010-08-22 DIAGNOSIS — Z0000 Encounter for general adult medical examination without abnormal findings: Secondary | ICD-10-CM

## 2010-08-22 DIAGNOSIS — Z9013 Acquired absence of bilateral breasts and nipples: Secondary | ICD-10-CM

## 2010-08-22 DIAGNOSIS — F411 Generalized anxiety disorder: Secondary | ICD-10-CM

## 2010-08-22 DIAGNOSIS — D869 Sarcoidosis, unspecified: Secondary | ICD-10-CM

## 2010-08-22 DIAGNOSIS — C787 Secondary malignant neoplasm of liver and intrahepatic bile duct: Secondary | ICD-10-CM

## 2010-08-22 DIAGNOSIS — C7B8 Other secondary neuroendocrine tumors: Secondary | ICD-10-CM

## 2010-08-22 DIAGNOSIS — F419 Anxiety disorder, unspecified: Secondary | ICD-10-CM

## 2010-08-22 DIAGNOSIS — Z23 Encounter for immunization: Secondary | ICD-10-CM

## 2010-08-22 DIAGNOSIS — Z9010 Acquired absence of unspecified breast and nipple: Secondary | ICD-10-CM

## 2010-08-22 DIAGNOSIS — J309 Allergic rhinitis, unspecified: Secondary | ICD-10-CM

## 2010-08-22 DIAGNOSIS — K219 Gastro-esophageal reflux disease without esophagitis: Secondary | ICD-10-CM

## 2010-08-22 LAB — POCT URINALYSIS DIPSTICK
Bilirubin, UA: NEGATIVE
Ketones, UA: NEGATIVE
Leukocytes, UA: NEGATIVE
Spec Grav, UA: 1.02
pH, UA: 5

## 2010-08-22 MED ORDER — VENLAFAXINE HCL ER 150 MG PO CP24: 150.0000 mg | ORAL_CAPSULE | Freq: Every day | ORAL | Status: DC

## 2010-08-22 MED ORDER — TETANUS-DIPHTH-ACELL PERTUSSIS 5-2.5-18.5 LF-MCG/0.5 IM SUSP
0.5000 mL | Freq: Once | INTRAMUSCULAR | Status: AC
  Administered 2010-08-22: 0.5 mL via INTRAMUSCULAR

## 2010-09-04 ENCOUNTER — Encounter (HOSPITAL_BASED_OUTPATIENT_CLINIC_OR_DEPARTMENT_OTHER): Payer: BC Managed Care – PPO | Admitting: Hematology & Oncology

## 2010-09-04 DIAGNOSIS — C787 Secondary malignant neoplasm of liver and intrahepatic bile duct: Secondary | ICD-10-CM

## 2010-09-04 DIAGNOSIS — C7A00 Malignant carcinoid tumor of unspecified site: Secondary | ICD-10-CM

## 2010-09-11 HISTORY — PX: KNEE ARTHROSCOPY: SHX127

## 2010-09-25 ENCOUNTER — Encounter (HOSPITAL_BASED_OUTPATIENT_CLINIC_OR_DEPARTMENT_OTHER): Payer: BC Managed Care – PPO | Admitting: Hematology & Oncology

## 2010-09-25 DIAGNOSIS — C7A00 Malignant carcinoid tumor of unspecified site: Secondary | ICD-10-CM

## 2010-09-25 DIAGNOSIS — C787 Secondary malignant neoplasm of liver and intrahepatic bile duct: Secondary | ICD-10-CM

## 2010-10-16 ENCOUNTER — Other Ambulatory Visit (HOSPITAL_COMMUNITY): Payer: BC Managed Care – PPO

## 2010-10-16 ENCOUNTER — Encounter (HOSPITAL_BASED_OUTPATIENT_CLINIC_OR_DEPARTMENT_OTHER): Payer: BC Managed Care – PPO | Admitting: Hematology & Oncology

## 2010-10-16 DIAGNOSIS — C7A00 Malignant carcinoid tumor of unspecified site: Secondary | ICD-10-CM

## 2010-10-16 DIAGNOSIS — C787 Secondary malignant neoplasm of liver and intrahepatic bile duct: Secondary | ICD-10-CM

## 2010-10-18 ENCOUNTER — Other Ambulatory Visit (HOSPITAL_COMMUNITY): Payer: Self-pay | Admitting: Orthopedic Surgery

## 2010-10-18 ENCOUNTER — Ambulatory Visit (HOSPITAL_COMMUNITY)
Admission: RE | Admit: 2010-10-18 | Discharge: 2010-10-18 | Disposition: A | Discharge status: Home / Self Care | Payer: BC Managed Care – PPO | Source: Ambulatory Visit | Attending: Orthopedic Surgery | Admitting: Orthopedic Surgery

## 2010-10-18 ENCOUNTER — Encounter (HOSPITAL_COMMUNITY)
Admission: RE | Admit: 2010-10-18 | Discharge: 2010-10-18 | Disposition: A | Discharge status: Home / Self Care | Payer: BC Managed Care – PPO | Source: Ambulatory Visit | Attending: Orthopedic Surgery | Admitting: Orthopedic Surgery

## 2010-10-18 DIAGNOSIS — Z01810 Encounter for preprocedural cardiovascular examination: Secondary | ICD-10-CM | POA: Insufficient documentation

## 2010-10-18 DIAGNOSIS — S83209A Unspecified tear of unspecified meniscus, current injury, unspecified knee, initial encounter: Secondary | ICD-10-CM

## 2010-10-18 DIAGNOSIS — IMO0002 Reserved for concepts with insufficient information to code with codable children: Secondary | ICD-10-CM | POA: Insufficient documentation

## 2010-10-18 DIAGNOSIS — Z01812 Encounter for preprocedural laboratory examination: Secondary | ICD-10-CM | POA: Insufficient documentation

## 2010-10-18 DIAGNOSIS — I10 Essential (primary) hypertension: Secondary | ICD-10-CM | POA: Insufficient documentation

## 2010-10-18 DIAGNOSIS — Z01818 Encounter for other preprocedural examination: Secondary | ICD-10-CM | POA: Insufficient documentation

## 2010-10-18 DIAGNOSIS — X58XXXA Exposure to other specified factors, initial encounter: Secondary | ICD-10-CM | POA: Insufficient documentation

## 2010-10-18 LAB — COMPREHENSIVE METABOLIC PANEL
AST: 24 U/L (ref 0–37)
Albumin: 3.6 g/dL (ref 3.5–5.2)
BUN: 12 mg/dL (ref 6–23)
Calcium: 9.4 mg/dL (ref 8.4–10.5)
Creatinine, Ser: 0.66 mg/dL (ref 0.50–1.10)
GFR calc non Af Amer: >60 mL/min (ref >60)
Total Bilirubin: 0.4 mg/dL (ref 0.3–1.2)

## 2010-10-18 LAB — CBC
Hemoglobin: 12.8 g/dL (ref 12.0–15.0)
MCH: 30.6 pg (ref 26.0–34.0)
RBC: 4.18 MIL/uL (ref 3.87–5.11)

## 2010-10-18 LAB — PROTIME-INR
INR: 1.1 (ref 0.00–1.49)
Prothrombin Time: 14.4 seconds (ref 11.6–15.2)

## 2010-10-18 LAB — SURGICAL PCR SCREEN
MRSA, PCR: NEGATIVE
Staphylococcus aureus: NEGATIVE

## 2010-10-21 ENCOUNTER — Ambulatory Visit (HOSPITAL_COMMUNITY)
Admission: RE | Admit: 2010-10-21 | Discharge: 2010-10-21 | Disposition: A | Discharge status: Home / Self Care | Payer: BC Managed Care – PPO | Source: Ambulatory Visit | Attending: Orthopedic Surgery | Admitting: Orthopedic Surgery

## 2010-10-21 DIAGNOSIS — Z01810 Encounter for preprocedural cardiovascular examination: Secondary | ICD-10-CM | POA: Insufficient documentation

## 2010-10-21 DIAGNOSIS — M23329 Other meniscus derangements, posterior horn of medial meniscus, unspecified knee: Secondary | ICD-10-CM | POA: Insufficient documentation

## 2010-10-21 DIAGNOSIS — I10 Essential (primary) hypertension: Secondary | ICD-10-CM | POA: Insufficient documentation

## 2010-10-21 DIAGNOSIS — Z01818 Encounter for other preprocedural examination: Secondary | ICD-10-CM | POA: Insufficient documentation

## 2010-10-21 DIAGNOSIS — Z01812 Encounter for preprocedural laboratory examination: Secondary | ICD-10-CM | POA: Insufficient documentation

## 2010-10-21 DIAGNOSIS — M1710 Unilateral primary osteoarthritis, unspecified knee: Secondary | ICD-10-CM | POA: Insufficient documentation

## 2010-10-21 DIAGNOSIS — M2240 Chondromalacia patellae, unspecified knee: Secondary | ICD-10-CM | POA: Insufficient documentation

## 2010-10-22 ENCOUNTER — Encounter: Payer: Self-pay | Admitting: Internal Medicine

## 2010-10-22 DIAGNOSIS — E1159 Type 2 diabetes mellitus with other circulatory complications: Secondary | ICD-10-CM | POA: Insufficient documentation

## 2010-10-22 DIAGNOSIS — I10 Essential (primary) hypertension: Secondary | ICD-10-CM | POA: Insufficient documentation

## 2010-10-22 DIAGNOSIS — M179 Osteoarthritis of knee, unspecified: Secondary | ICD-10-CM | POA: Insufficient documentation

## 2010-10-22 DIAGNOSIS — F419 Anxiety disorder, unspecified: Secondary | ICD-10-CM | POA: Insufficient documentation

## 2010-10-22 DIAGNOSIS — J309 Allergic rhinitis, unspecified: Secondary | ICD-10-CM | POA: Insufficient documentation

## 2010-10-22 DIAGNOSIS — K219 Gastro-esophageal reflux disease without esophagitis: Secondary | ICD-10-CM | POA: Insufficient documentation

## 2010-10-22 DIAGNOSIS — M1990 Unspecified osteoarthritis, unspecified site: Secondary | ICD-10-CM | POA: Insufficient documentation

## 2010-10-22 DIAGNOSIS — M1710 Unilateral primary osteoarthritis, unspecified knee: Secondary | ICD-10-CM | POA: Insufficient documentation

## 2010-10-22 DIAGNOSIS — Z9013 Acquired absence of bilateral breasts and nipples: Secondary | ICD-10-CM | POA: Insufficient documentation

## 2010-10-22 DIAGNOSIS — D869 Sarcoidosis, unspecified: Secondary | ICD-10-CM | POA: Insufficient documentation

## 2010-10-22 DIAGNOSIS — C7B8 Other secondary neuroendocrine tumors: Secondary | ICD-10-CM | POA: Insufficient documentation

## 2010-10-22 NOTE — Progress Notes (Signed)
  Subjective:    Patient ID: Leah [name], female    DOB: February 09, 1961, 50 y.o.   MRN: [id number]  HPI 50 year old white female with remote history of sarcoidosis, allergic rhinitis, GE reflux and hypertension diagnosed in 2006. History of tonsillectomy 1976, C-section 1999 & 2001, cholecystectomy 2003 hysterectomy and oophorectomy 2004, bilateral mastectomies. Nurse practitioner for GYN issues is [facility]. Had Pap smear 2011. Had endoscopy an colonoscopy 2010. Had T. DTaP vaccine today 08/22/2010, influenza vaccine August 2011. History of osteoarthritis left knee. History of osteoarthritis in her feet. Followed by rheumatologist Dr. Corliss Skains. Dr. 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 She had a partial hepatectomy at [facility] and has done well since then. It's been about 16 months since that procedure was done. There is an extensive family history for breast cancer including her mother and sister therefore she underwent bilateral prophylactic mastectomies and bilateral breast reconstruction January 2008. Presently her blood pressure is under excellent control. She does take Effexor for hot flashes. Takes trazodone to sleep on a when necessary basis.  Family history: Father died at age 51 of an MI, mother died at age 55 with breast cancer with history of lupus, one brother in good health. 2 children both boys in good health.  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 Does not smoke or consume alcohol.  Dr. Elnoria Howard has been seen patient for carcinoid syndrome and and she was treated back in 2010 with octreotide for diarrhea.    Review of Systems  Constitutional: Negative.   HENT: Negative.   Eyes: Negative.   Respiratory: Negative.   Cardiovascular: Negative.   Gastrointestinal: Negative.   Genitourinary: Negative.   Musculoskeletal: Negative.     Neurological: Negative.   Hematological: Negative.   Psychiatric/Behavioral: Negative.        Objective:   Physical Exam  Vitals reviewed. Constitutional: She is oriented to person, place, and time. She appears well-nourished. No distress.  HENT:  Head: Normocephalic.  Right Ear: External ear normal.  Left Ear: External ear normal.  Mouth/Throat: No oropharyngeal exudate.  Eyes: Conjunctivae and EOM are normal. Pupils are equal, round, and reactive to light. No scleral icterus.  Neck: No JVD present. No thyromegaly present.  Cardiovascular: Normal rate, regular rhythm and normal heart sounds.   No murmur heard. Pulmonary/Chest: Effort normal and breath sounds normal. She has no wheezes.       Breasts bilateral reconstruction without masses  Abdominal: Soft. Bowel sounds are normal. She exhibits no distension and no mass. There is no tenderness. There is no rebound.  Genitourinary:       Deferred  Musculoskeletal: She exhibits no edema.  Lymphadenopathy:    She has no cervical adenopathy.  Neurological: She is alert and oriented to person, place, and time. She has normal reflexes.  Psychiatric: She has a normal mood and affect. Her behavior is normal. Judgment normal.          Assessment & Plan:  History of neuroendocrine tumor of the pancreas metastatic to the liver status post partial hepatectom. Doing well followed by cancer Center on Sandostatin.  Hypertension  Allergic rhinitis  Obesity  GE reflux  Remote history of sarcoidosis-not active   Patient is to return in 6 months. Encouraged diet exercise and weight loss.

## 2010-11-12 ENCOUNTER — Telehealth: Payer: Self-pay | Admitting: Internal Medicine

## 2010-11-12 MED ORDER — FEXOFENADINE HCL 180 MG PO TABS: 180.0000 mg | ORAL_TABLET | Freq: Every day | ORAL | Status: DC

## 2010-11-12 NOTE — Telephone Encounter (Signed)
Please have [name] check chart and call this in for one year.

## 2010-11-13 ENCOUNTER — Other Ambulatory Visit: Payer: Self-pay | Admitting: Hematology & Oncology

## 2010-11-13 ENCOUNTER — Encounter (HOSPITAL_BASED_OUTPATIENT_CLINIC_OR_DEPARTMENT_OTHER): Payer: BC Managed Care – PPO | Admitting: Hematology & Oncology

## 2010-11-13 DIAGNOSIS — C787 Secondary malignant neoplasm of liver and intrahepatic bile duct: Secondary | ICD-10-CM

## 2010-11-13 DIAGNOSIS — C7A00 Malignant carcinoid tumor of unspecified site: Secondary | ICD-10-CM

## 2010-11-13 LAB — CBC WITH DIFFERENTIAL (CANCER CENTER ONLY)
BASO%: 0.3 % (ref 0.0–2.0)
EOS%: 1.4 % (ref 0.0–7.0)
HCT: 37.9 % (ref 34.8–46.6)
LYMPH#: 2.4 10*3/uL (ref 0.9–3.3)
MCHC: 34.3 g/dL (ref 32.0–36.0)
MONO#: 0.5 10*3/uL (ref 0.1–0.9)
NEUT#: 4.7 10*3/uL (ref 1.5–6.5)
NEUT%: 60.4 % (ref 39.6–80.0)
RDW: 13.8 % (ref 11.1–15.7)
WBC: 7.8 10*3/uL (ref 3.9–10.0)

## 2010-11-17 NOTE — Op Note (Signed)
  Leah [name], Leah [name]                ACCOUNT NO.:  [id number]  MEDICAL RECORD NO.:  [id number]  LOCATION:  [facility]                         FACILITY:  [facility]  PHYSICIAN:  Mila Homer. Sherlean Foot, M.D. DATE OF BIRTH:  01-Sep-1960  DATE OF PROCEDURE:  10/21/2010 DATE OF DISCHARGE:  10/21/2010                              OPERATIVE REPORT   SURGEON:  Mila Homer. Sherlean Foot, M.D.  ASSISTANTLaural Benes. Su Hilt, PA.  ANESTHESIA:  General.  PREOPERATIVE DIAGNOSES:  Left knee osteoarthritis and medial meniscus tear.  POSTOPERATIVE DIAGNOSIS:  Left knee osteoarthritis and medial meniscus tear.  PROCEDURE:  Left knee arthroscopy with partial medial meniscectomy, chondroplasty of medial and patellofemoral compartment.  INDICATIONS FOR PROCEDURE:  The patient is a 50 year old with considerable mechanical symptoms and informed consent for knee arthroscopy.  DESCRIPTION OF PROCEDURE:  The patient was laid supine, administered general anesthesia.  Inferolateral and inferomedial portals were created after sterile prep and drape.  Diagnostic arthroscopy of chondromalacia in the patellofemoral joint as well as grade II to III in the medial femoral condyle.  There was a large complex tear in the posterior horn of the meniscus.  This was debrided as well.  There are some chondromalacia in the lateral tibial articular surface, which was debrided also.  There was some anterior horn lateral meniscus tear, which was cleaned up with straight basket forceps and [place] shaver.  I then lavaged and closed with 4-0 Vicryl sutures.  Dressed with Xeroform dressing, sponges, sterile Webril, and Ace wrap.  COMPLICATIONS:  None.  DRAINS:  None.          ______________________________ Mila Homer. Sherlean Foot, M.D.     SDL/MEDQ  D:  10/28/2010  T:  10/28/2010  Job:  [id number]  Electronically Signed by Georgena Spurling M.D. on 11/17/2010 11:20:55 AM

## 2010-11-18 ENCOUNTER — Ambulatory Visit (INDEPENDENT_AMBULATORY_CARE_PROVIDER_SITE_OTHER): Payer: BC Managed Care – PPO | Admitting: Internal Medicine

## 2010-11-18 DIAGNOSIS — Z23 Encounter for immunization: Secondary | ICD-10-CM

## 2010-11-19 LAB — COMPREHENSIVE METABOLIC PANEL
ALT: 27 U/L (ref 0–35)
AST: 27 U/L (ref 0–37)
Albumin: 4.3 g/dL (ref 3.5–5.2)
Alkaline Phosphatase: 96 U/L (ref 39–117)
Calcium: 9.1 mg/dL (ref 8.4–10.5)
Chloride: 103 mEq/L (ref 96–112)
Potassium: 4.6 mEq/L (ref 3.5–5.3)
Sodium: 141 mEq/L (ref 135–145)
Total Protein: 7.3 g/dL (ref 6.0–8.3)

## 2010-11-19 LAB — LACTATE DEHYDROGENASE: LDH: 194 U/L (ref 94–250)

## 2010-12-04 ENCOUNTER — Encounter: Payer: Self-pay | Admitting: *Deleted

## 2010-12-04 ENCOUNTER — Encounter (HOSPITAL_BASED_OUTPATIENT_CLINIC_OR_DEPARTMENT_OTHER): Payer: BC Managed Care – PPO | Admitting: Hematology & Oncology

## 2010-12-04 DIAGNOSIS — C787 Secondary malignant neoplasm of liver and intrahepatic bile duct: Secondary | ICD-10-CM

## 2010-12-04 DIAGNOSIS — C7A00 Malignant carcinoid tumor of unspecified site: Secondary | ICD-10-CM

## 2010-12-11 ENCOUNTER — Other Ambulatory Visit: Payer: Self-pay | Admitting: Family

## 2010-12-12 ENCOUNTER — Other Ambulatory Visit: Payer: Self-pay

## 2010-12-12 DIAGNOSIS — L309 Dermatitis, unspecified: Secondary | ICD-10-CM

## 2010-12-12 MED ORDER — TRIAMCINOLONE ACETONIDE 0.1 % EX CREA: TOPICAL_CREAM | Freq: Three times a day (TID) | CUTANEOUS | Status: DC

## 2010-12-12 MED ORDER — VENLAFAXINE HCL ER 150 MG PO CP24: 150.0000 mg | ORAL_CAPSULE | Freq: Every day | ORAL | Status: DC

## 2010-12-13 ENCOUNTER — Other Ambulatory Visit: Payer: Self-pay | Admitting: Internal Medicine

## 2010-12-13 DIAGNOSIS — Z1231 Encounter for screening mammogram for malignant neoplasm of breast: Secondary | ICD-10-CM

## 2010-12-17 ENCOUNTER — Telehealth: Payer: Self-pay | Admitting: Internal Medicine

## 2010-12-17 NOTE — Telephone Encounter (Signed)
Advised patient to keep BP readings for 7-10 days without having fluid pill and call to schedule appt and bring in readings.  Patient verbalized understanding.

## 2010-12-17 NOTE — Telephone Encounter (Signed)
Keep readings off fluid pill for 7-10 days and come in for office visit.

## 2010-12-25 ENCOUNTER — Ambulatory Visit (HOSPITAL_BASED_OUTPATIENT_CLINIC_OR_DEPARTMENT_OTHER): Payer: BC Managed Care – PPO

## 2010-12-25 DIAGNOSIS — C787 Secondary malignant neoplasm of liver and intrahepatic bile duct: Secondary | ICD-10-CM

## 2010-12-25 DIAGNOSIS — C7B8 Other secondary neuroendocrine tumors: Secondary | ICD-10-CM

## 2010-12-25 DIAGNOSIS — C7A00 Malignant carcinoid tumor of unspecified site: Secondary | ICD-10-CM

## 2010-12-25 MED ORDER — OCTREOTIDE ACETATE 30 MG IM KIT
30.0000 mg | PACK | Freq: Once | INTRAMUSCULAR | Status: AC
  Administered 2010-12-25: 30 mg via INTRAMUSCULAR

## 2011-01-07 ENCOUNTER — Other Ambulatory Visit: Payer: Self-pay | Admitting: Internal Medicine

## 2011-01-08 DIAGNOSIS — C50919 Malignant neoplasm of unspecified site of unspecified female breast: Secondary | ICD-10-CM | POA: Insufficient documentation

## 2011-01-14 ENCOUNTER — Ambulatory Visit (HOSPITAL_BASED_OUTPATIENT_CLINIC_OR_DEPARTMENT_OTHER): Payer: BC Managed Care – PPO

## 2011-01-14 ENCOUNTER — Ambulatory Visit: Payer: BC Managed Care – PPO

## 2011-01-14 DIAGNOSIS — C787 Secondary malignant neoplasm of liver and intrahepatic bile duct: Secondary | ICD-10-CM

## 2011-01-14 DIAGNOSIS — C7A00 Malignant carcinoid tumor of unspecified site: Secondary | ICD-10-CM

## 2011-01-14 DIAGNOSIS — C7B8 Other secondary neuroendocrine tumors: Secondary | ICD-10-CM

## 2011-01-14 MED ORDER — OCTREOTIDE ACETATE 30 MG IM KIT
30.0000 mg | PACK | Freq: Once | INTRAMUSCULAR | Status: AC
  Administered 2011-01-14: 30 mg via INTRAMUSCULAR

## 2011-01-15 ENCOUNTER — Ambulatory Visit: Payer: BC Managed Care – PPO

## 2011-01-19 ENCOUNTER — Other Ambulatory Visit: Payer: Self-pay | Admitting: Internal Medicine

## 2011-01-20 ENCOUNTER — Other Ambulatory Visit: Payer: Self-pay

## 2011-01-20 MED ORDER — MONTELUKAST SODIUM 10 MG PO TABS: 10.0000 mg | ORAL_TABLET | Freq: Every day | ORAL | Status: DC

## 2011-01-21 ENCOUNTER — Other Ambulatory Visit: Payer: Self-pay

## 2011-01-21 DIAGNOSIS — L309 Dermatitis, unspecified: Secondary | ICD-10-CM

## 2011-01-21 MED ORDER — TRIAMCINOLONE ACETONIDE 0.1 % EX CREA: TOPICAL_CREAM | Freq: Three times a day (TID) | CUTANEOUS | Status: DC

## 2011-01-22 ENCOUNTER — Other Ambulatory Visit: Payer: Self-pay | Admitting: Internal Medicine

## 2011-01-23 ENCOUNTER — Encounter: Payer: Self-pay | Admitting: Internal Medicine

## 2011-01-23 ENCOUNTER — Ambulatory Visit (INDEPENDENT_AMBULATORY_CARE_PROVIDER_SITE_OTHER): Payer: BC Managed Care – PPO | Admitting: Internal Medicine

## 2011-01-23 VITALS — BP 162/106 | HR 80 | Temp 98.1°F | Resp 20 | Wt 205.0 lb

## 2011-01-23 DIAGNOSIS — I10 Essential (primary) hypertension: Secondary | ICD-10-CM

## 2011-01-29 ENCOUNTER — Telehealth: Payer: Self-pay | Admitting: *Deleted

## 2011-01-29 NOTE — Telephone Encounter (Signed)
Pt called wanting to know if she can come in this week for her Sandostatin injection. She is starting to have symptoms. Will the schedulers change her appt from 12/26 to tomorrow at 2:30.

## 2011-01-30 ENCOUNTER — Ambulatory Visit (HOSPITAL_BASED_OUTPATIENT_CLINIC_OR_DEPARTMENT_OTHER): Payer: BC Managed Care – PPO

## 2011-01-30 DIAGNOSIS — C7A00 Malignant carcinoid tumor of unspecified site: Secondary | ICD-10-CM

## 2011-01-30 DIAGNOSIS — C787 Secondary malignant neoplasm of liver and intrahepatic bile duct: Secondary | ICD-10-CM

## 2011-01-30 DIAGNOSIS — C7B8 Other secondary neuroendocrine tumors: Secondary | ICD-10-CM

## 2011-01-30 MED ORDER — OCTREOTIDE ACETATE 30 MG IM KIT
30.0000 mg | PACK | Freq: Once | INTRAMUSCULAR | Status: AC
  Administered 2011-01-30: 30 mg via INTRAMUSCULAR

## 2011-02-05 ENCOUNTER — Ambulatory Visit: Payer: BC Managed Care – PPO

## 2011-02-06 ENCOUNTER — Ambulatory Visit: Payer: BC Managed Care – PPO | Admitting: Internal Medicine

## 2011-02-09 ENCOUNTER — Encounter: Payer: Self-pay | Admitting: Internal Medicine

## 2011-02-09 NOTE — Patient Instructions (Signed)
Amlodipine 5 mg daily to Diovan daily. Return for followup. Keep an eye on your blood pressure.

## 2011-02-09 NOTE — Progress Notes (Signed)
  Subjective:    Patient ID: Leah [name], female    DOB: Jun 13, 1960, 50 y.o.   MRN: [id number]  HPI patient here because she's noted blood pressures been elevated recently. At one point she had come off some of her antihypertensive medication after having ablation of neuroendocrine tumor at [facility]. Currently just on Diovan. Basically feels well. Continues to see Dr. Myna Hidalgo for Sandostatin injections.    Review of Systems     Objective:   Physical Exam chest clear to auscultation; no carotid bruits; cardiac exam regular rate and rhythm; extremities without edema        Assessment & Plan:  History of neuroendocrine tumor  Hypertension  Plan: Add amlodipine 5 mg daily and followup 3-4 weeks.

## 2011-02-26 ENCOUNTER — Other Ambulatory Visit: Payer: Self-pay | Admitting: *Deleted

## 2011-02-26 ENCOUNTER — Ambulatory Visit (HOSPITAL_BASED_OUTPATIENT_CLINIC_OR_DEPARTMENT_OTHER): Payer: BC Managed Care – PPO

## 2011-02-26 ENCOUNTER — Other Ambulatory Visit (HOSPITAL_BASED_OUTPATIENT_CLINIC_OR_DEPARTMENT_OTHER): Payer: BC Managed Care – PPO | Admitting: Lab

## 2011-02-26 ENCOUNTER — Ambulatory Visit (HOSPITAL_BASED_OUTPATIENT_CLINIC_OR_DEPARTMENT_OTHER): Payer: BC Managed Care – PPO | Admitting: Hematology & Oncology

## 2011-02-26 VITALS — BP 151/96 | HR 110 | Temp 97.2°F | Ht 59.0 in | Wt 204.0 lb

## 2011-02-26 DIAGNOSIS — C7A00 Malignant carcinoid tumor of unspecified site: Secondary | ICD-10-CM

## 2011-02-26 DIAGNOSIS — C787 Secondary malignant neoplasm of liver and intrahepatic bile duct: Secondary | ICD-10-CM

## 2011-02-26 DIAGNOSIS — C7B8 Other secondary neuroendocrine tumors: Secondary | ICD-10-CM

## 2011-02-26 DIAGNOSIS — R197 Diarrhea, unspecified: Secondary | ICD-10-CM

## 2011-02-26 DIAGNOSIS — R110 Nausea: Secondary | ICD-10-CM

## 2011-02-26 DIAGNOSIS — C7A8 Other malignant neuroendocrine tumors: Secondary | ICD-10-CM

## 2011-02-26 LAB — CBC WITH DIFFERENTIAL (CANCER CENTER ONLY)
EOS%: 1.7 % (ref 0.0–7.0)
Eosinophils Absolute: 0.2 10*3/uL (ref 0.0–0.5)
LYMPH%: 30.3 % (ref 14.0–48.0)
MCH: 30.5 pg (ref 26.0–34.0)
MCHC: 33.3 g/dL (ref 32.0–36.0)
MCV: 92 fL (ref 81–101)
MONO%: 6.7 % (ref 0.0–13.0)
NEUT#: 6.7 10*3/uL — ABNORMAL HIGH (ref 1.5–6.5)
Platelets: 253 10*3/uL (ref 145–400)
RBC: 4.16 10*6/uL (ref 3.70–5.32)
RDW: 13.5 % (ref 11.1–15.7)

## 2011-02-26 MED ORDER — ONDANSETRON HCL 4 MG PO TABS: 4.0000 mg | ORAL_TABLET | Freq: Three times a day (TID) | ORAL | Status: AC | PRN

## 2011-02-26 MED ORDER — OCTREOTIDE ACETATE 30 MG IM KIT
30.0000 mg | PACK | Freq: Once | INTRAMUSCULAR | Status: AC
  Administered 2011-02-26: 30 mg via INTRAMUSCULAR

## 2011-02-26 NOTE — Progress Notes (Signed)
CC:   Leah [name]. Leah [name], M.D. Leah [name], M.D. Leah Furlong, MD  DIAGNOSIS:  Metastatic neuroendocrine tumor with liver metastasis.  CURRENT THERAPY:  Sandostatin 30 mg IM q.3 weeks.  INTERIM HISTORY:  Leah [name] comes in for followup.  She is doing okay. The Sandostatin every 3 weeks works very well for her.  When she goes longer, she begins to have more in the way of symptoms.  When we last saw her in October, her chromogranin A level was nice and steady at 4.2.  She does state some diarrhea.  She does have occasional nausea.  There has been some abdominal discomfort.  When I last saw her, she was getting over her knee surgery on her left knee.  She is doing much better.  She went to [place] since we last saw her.  She apparently had a nice time down there.  She has not had any problems with headache.  There has been no cough. She has had no wheezing.  PHYSICAL EXAMINATION:  General Appearance:  This is a mildly obese, white female in no obvious distress.  Vital Signs:  97.2, pulse 110, respiratory rate 18, blood pressure 152/96.  Weight is 204.  Head and Neck Exam:  A normocephalic, atraumatic skull.  There are no ocular or oral lesions.  There are no palpable cervical or supraclavicular lymph nodes.  Lungs:  Clear bilaterally.  Cardiac Exam:  Regular rate and rhythm with a normal S1 and S2.  There are no murmurs, rubs, or bruits. Abdominal Exam:  Soft.  She has well-healed laparotomy scars.  There is no fluid wave.  There is no abdominal mass.  There is no palpable hepatosplenomegaly.  Back Exam:  No tenderness over the spine, ribs, or hips.  Extremities:  No clubbing, cyanosis, or edema.  She has good range motion of her joints.  Skin Exam:  No rashes, ecchymoses, or petechia.  LABORATORY DATA:  White cell count is 11, hemoglobin 12.7, hematocrit 38.1, platelet count 253.  IMPRESSION:  Leah [name] is a 51 year old, white female with a metastatic neuroendocrine tumor.   She has liver metastasis.  She did undergo a partial hepatectomy back in October 2011.  She follows up with Dr. Flonnie Hailstone at [facility], I think, 2 or 3 times a year.  They do scans on her.  We will continue Sandostatin every 3 weeks.  Again, this really seem to be a good time interval for her.  We will see Leah [name] back ourselves in another 3-4 months.    ______________________________ Josph Macho, M.D. PRE/MEDQ  D:  02/26/2011  T:  02/26/2011  Job:  [id number]

## 2011-02-26 NOTE — Progress Notes (Signed)
Dict

## 2011-03-04 LAB — CHROMOGRANIN A: Chromogranin A: 6.6 ng/mL (ref 1.9–15.0)

## 2011-03-04 LAB — COMPREHENSIVE METABOLIC PANEL
Alkaline Phosphatase: 102 U/L (ref 39–117)
BUN: 13 mg/dL (ref 6–23)
CO2: 26 mEq/L (ref 19–32)
Creatinine, Ser: 0.64 mg/dL (ref 0.50–1.10)
Glucose, Bld: 118 mg/dL — ABNORMAL HIGH (ref 70–99)
Sodium: 138 mEq/L (ref 135–145)
Total Bilirubin: 0.3 mg/dL (ref 0.3–1.2)
Total Protein: 6.9 g/dL (ref 6.0–8.3)

## 2011-03-04 LAB — LACTATE DEHYDROGENASE: LDH: 199 U/L (ref 94–250)

## 2011-03-13 ENCOUNTER — Ambulatory Visit (HOSPITAL_BASED_OUTPATIENT_CLINIC_OR_DEPARTMENT_OTHER): Payer: BC Managed Care – PPO

## 2011-03-13 ENCOUNTER — Other Ambulatory Visit: Payer: Self-pay | Admitting: Hematology & Oncology

## 2011-03-13 DIAGNOSIS — C7B8 Other secondary neuroendocrine tumors: Secondary | ICD-10-CM

## 2011-03-13 DIAGNOSIS — C787 Secondary malignant neoplasm of liver and intrahepatic bile duct: Secondary | ICD-10-CM

## 2011-03-13 DIAGNOSIS — C7A00 Malignant carcinoid tumor of unspecified site: Secondary | ICD-10-CM

## 2011-03-13 MED ORDER — OCTREOTIDE ACETATE 30 MG IM KIT
30.0000 mg | PACK | Freq: Once | INTRAMUSCULAR | Status: AC
  Administered 2011-03-13: 30 mg via INTRAMUSCULAR

## 2011-03-14 ENCOUNTER — Ambulatory Visit: Payer: BC Managed Care – PPO

## 2011-03-19 ENCOUNTER — Ambulatory Visit: Payer: BC Managed Care – PPO

## 2011-03-27 ENCOUNTER — Telehealth: Payer: Self-pay | Admitting: Hematology & Oncology

## 2011-03-27 NOTE — Telephone Encounter (Signed)
Pt aware of 2-19 injection she said she was having symptoms. MD said that was fine and pt is aware we will keep 3-20 and she is to call to schedule another injection is needed before then

## 2011-03-28 ENCOUNTER — Ambulatory Visit: Payer: BC Managed Care – PPO

## 2011-03-31 ENCOUNTER — Ambulatory Visit (HOSPITAL_BASED_OUTPATIENT_CLINIC_OR_DEPARTMENT_OTHER): Payer: BC Managed Care – PPO

## 2011-03-31 ENCOUNTER — Telehealth: Payer: Self-pay | Admitting: Hematology & Oncology

## 2011-03-31 VITALS — BP 145/88 | HR 106 | Temp 97.8°F

## 2011-03-31 DIAGNOSIS — C7A00 Malignant carcinoid tumor of unspecified site: Secondary | ICD-10-CM

## 2011-03-31 DIAGNOSIS — C787 Secondary malignant neoplasm of liver and intrahepatic bile duct: Secondary | ICD-10-CM

## 2011-03-31 DIAGNOSIS — C7B8 Other secondary neuroendocrine tumors: Secondary | ICD-10-CM

## 2011-03-31 MED ORDER — OCTREOTIDE ACETATE 30 MG IM KIT
30.0000 mg | PACK | Freq: Once | INTRAMUSCULAR | Status: AC
  Administered 2011-03-31: 30 mg via INTRAMUSCULAR

## 2011-03-31 NOTE — Telephone Encounter (Signed)
Pt called and cx 04/01/11 apt and resch for today.  Nurse is aware of apt change

## 2011-04-01 ENCOUNTER — Ambulatory Visit: Payer: BC Managed Care – PPO

## 2011-04-09 ENCOUNTER — Ambulatory Visit: Payer: BC Managed Care – PPO

## 2011-04-11 ENCOUNTER — Other Ambulatory Visit: Payer: Self-pay

## 2011-04-11 MED ORDER — AMLODIPINE BESYLATE 5 MG PO TABS: 5.0000 mg | ORAL_TABLET | Freq: Every day | ORAL | Status: DC

## 2011-04-18 ENCOUNTER — Ambulatory Visit (HOSPITAL_BASED_OUTPATIENT_CLINIC_OR_DEPARTMENT_OTHER): Payer: BC Managed Care – PPO

## 2011-04-18 DIAGNOSIS — C787 Secondary malignant neoplasm of liver and intrahepatic bile duct: Secondary | ICD-10-CM

## 2011-04-18 DIAGNOSIS — C7B8 Other secondary neuroendocrine tumors: Secondary | ICD-10-CM

## 2011-04-18 DIAGNOSIS — C7A00 Malignant carcinoid tumor of unspecified site: Secondary | ICD-10-CM

## 2011-04-18 MED ORDER — OCTREOTIDE ACETATE 30 MG IM KIT
30.0000 mg | PACK | Freq: Once | INTRAMUSCULAR | Status: AC
  Administered 2011-04-18: 30 mg via INTRAMUSCULAR

## 2011-04-30 ENCOUNTER — Telehealth: Payer: Self-pay | Admitting: Hematology & Oncology

## 2011-04-30 ENCOUNTER — Ambulatory Visit (HOSPITAL_BASED_OUTPATIENT_CLINIC_OR_DEPARTMENT_OTHER): Payer: BC Managed Care – PPO

## 2011-04-30 ENCOUNTER — Ambulatory Visit (HOSPITAL_BASED_OUTPATIENT_CLINIC_OR_DEPARTMENT_OTHER): Payer: BC Managed Care – PPO | Admitting: Hematology & Oncology

## 2011-04-30 ENCOUNTER — Other Ambulatory Visit (HOSPITAL_BASED_OUTPATIENT_CLINIC_OR_DEPARTMENT_OTHER): Payer: BC Managed Care – PPO | Admitting: Lab

## 2011-04-30 VITALS — BP 100/60 | Temp 97.6°F | Ht 59.0 in | Wt 208.0 lb

## 2011-04-30 DIAGNOSIS — C7B8 Other secondary neuroendocrine tumors: Secondary | ICD-10-CM

## 2011-04-30 DIAGNOSIS — C7A8 Other malignant neuroendocrine tumors: Secondary | ICD-10-CM

## 2011-04-30 DIAGNOSIS — C7A00 Malignant carcinoid tumor of unspecified site: Secondary | ICD-10-CM

## 2011-04-30 DIAGNOSIS — C787 Secondary malignant neoplasm of liver and intrahepatic bile duct: Secondary | ICD-10-CM

## 2011-04-30 LAB — CBC WITH DIFFERENTIAL (CANCER CENTER ONLY)
BASO%: 0.4 % (ref 0.0–2.0)
EOS%: 1.8 % (ref 0.0–7.0)
HCT: 37.1 % (ref 34.8–46.6)
LYMPH#: 2.6 10*3/uL (ref 0.9–3.3)
MONO#: 0.6 10*3/uL (ref 0.1–0.9)
NEUT#: 4.7 10*3/uL (ref 1.5–6.5)
NEUT%: 58.1 % (ref 39.6–80.0)
RDW: 14.1 % (ref 11.1–15.7)
WBC: 8.2 10*3/uL (ref 3.9–10.0)

## 2011-04-30 MED ORDER — OCTREOTIDE ACETATE 30 MG IM KIT
30.0000 mg | PACK | Freq: Once | INTRAMUSCULAR | Status: AC
  Administered 2011-04-30: 30 mg via INTRAMUSCULAR

## 2011-04-30 NOTE — Patient Instructions (Signed)
Octreotide injection solution  What is this medicine?  OCTREOTIDE (ok TREE oh tide) is used to reduce blood levels of growth hormone in patients with a condition called acromegaly. This medicine also reduces flushing and watery diarrhea caused by certain types of cancer. This medicine may be used for other purposes; ask your health care provider or pharmacist if you have questions.  What should I tell my health care provider before I take this medicine? They need to know if you have any of these conditions: -gallbladder disease -kidney disease -liver disease -an unusual or allergic reaction to octreotide, other medicines, foods, dyes, or preservatives -pregnant or trying to get pregnant -breast-feeding  How should I use this medicine?  This medicine is for injection under the skin or into a vein (only in emergency situations). It is usually given by a health care professional in a hospital or clinic setting.   NOTE: This medicine is only for you. Do not share this medicine with others.  What if I miss a dose?  If you miss a dose, take it as soon as you can. If it is almost time for your next dose, take only that dose. Do not take double or extra doses.  What may interact with this medicine?  Do not take this medicine with any of the following medications: -cisapride -droperidol -general anesthetics -grepafloxacin -perphenazine -thioridazine This medicine may also interact with the following medications: -bromocriptine -cyclosporine -diuretics -medicines for blood pressure, heart disease, irregular heart beat -medicines for diabetes, including insulin -quinidine  This list may not describe all possible interactions. Give your health care provider a list of all the medicines, herbs, non-prescription drugs, or dietary supplements you use. Also tell them if you smoke, drink alcohol, or use illegal drugs. Some items may interact with your medicine.  What should I watch for  while using this medicine?  Visit your doctor or health care professional for regular checks on your progress. To help reduce irritation at the injection site, use a different site for each injection and make sure the solution is at room temperature before use. This medicine may cause increases or decreases in blood sugar. Signs of high blood sugar include frequent urination, unusual thirst, flushed or dry skin, difficulty breathing, drowsiness, stomach ache, nausea, vomiting or dry mouth. Signs of low blood sugar include chills, cool, pale skin or cold sweats, drowsiness, extreme hunger, fast heartbeat, headache, nausea, nervousness or anxiety, shakiness, trembling, unsteadiness, tiredness, or weakness. Contact your doctor or health care professional right away if you experience any of these symptoms.  What side effects may I notice from receiving this medicine? Side effects that you should report to your doctor or health care professional as soon as possible: -allergic reactions like skin rash, itching or hives, swelling of the face, lips, or tongue -changes in blood sugar -changes in heart rate -severe stomach pain  Side effects that usually do not require medical attention (report to your doctor or health care professional if they continue or are bothersome): -diarrhea or constipation -gas or stomach pain -nausea, vomiting -pain, redness, swelling and irritation at site where injected  This list may not describe all possible side effects. Call your doctor for medical advice about side effects. You may report side effects to FDA at 1-800-FDA-1088.   2012, Elsevier/Gold Standard. (08/24/2007 4:56:04 PM)

## 2011-04-30 NOTE — Progress Notes (Signed)
This office note has been dictated.

## 2011-04-30 NOTE — Progress Notes (Signed)
CC:   Leah [name]. Leah [name], M.D. Leah [name], M.D. Leah Furlong, MD  DIAGNOSES: 1. Metastatic neuroendocrine tumor-liver mets. 2. Undefined renal lesion, I think in the right kidney.  CURRENT THERAPY:  Sandostatin 30 mg IM q.2 weeks.  INTERIM HISTORY:  Leah [name] comes in for followup.  We have her on Sandostatin every 2 weeks for right now.  This seems to be helping her out.  She is much more functional with every 2 weeks of Sandostatin. She is not having problems with wheezing.  There is no diarrhea.  She is having no rashes.  There has been no leg swelling.  She is having problems with her left leg.  She had some knee surgery done, which she is not really recovered from.  We are following her chromogranin A level.  Back in January it was 6.6 which is holding steady for her.  She said that she had an MRI done at [facility] recently.  This did not show any evidence of growth of her neuroendocrine tumor.  However, she said that there is something in her right kidney that had grown over the past year or so.  It was only 1.2 cm from what she tells me.  Leah [name] at [facility] was not worried about this but wants to follow this along in 3 months.  She is not having any kind of cough.  There has been no fever.  She has had no headache.  She has had some emotional on lability.  This is fairly well-controlled with her medications (i.e.  Effexor).  PHYSICAL EXAM:  General: This is a well-developed, well-nourished, white female in no obvious distress.  Vital signs: Show temperature of 97.6, pulse 105, respiratory rate 18, blood pressure 100/60, and weight is 208.  Head and neck exam shows a normocephalic, atraumatic skull.  There are no ocular or oral lesions.  There are no palpable cervical or supraclavicular lymph nodes.  Lungs are clear bilaterally.  She has no wheezes.  Cardiac examination:  Regular rate and rhythm with a normal S1 and S2.  There are no murmurs, rubs, or bruits.  Abdominal exam:  Soft with good bowel sounds.  There is no palpable abdominal mass.  There is no palpable hepatosplenomegaly.  She has well-healed laparotomy scars. Extremities: Shows no clubbing, cyanosis, or edema.  Neurological exam: Shows no focal neurological deficits.  Skin exam: No rashes, ecchymosis, or petechia.  LABORATORY STUDIES:  White cell count 0.2, hemoglobin 12.4, hematocrit 30, was 7.1, and platelet count 227.  IMPRESSION:  Leah [name] is a 51 year old white female with metastatic neuroendocrine tumor.  She underwent resection at [facility].  She really is on "adjuvant" therapy with Sandostatin.  Her hepatectomy was back in October of 2011.  Her chromogranin A level is a fairly good indicator for her. So far it is holding steady.  We will plan to get her back every 2 weeks for her Sandostatin.  I will see her back myself probably in about 2 months so.    ______________________________ Leah [name], M.D. PRE/MEDQ  D:  04/30/2011  T:  04/30/2011  Job:  [id number]

## 2011-04-30 NOTE — Telephone Encounter (Signed)
Left pt message with April and may appointment dates and times and to get a copy of schedule when she comes back.

## 2011-05-02 ENCOUNTER — Other Ambulatory Visit: Payer: Self-pay | Admitting: Internal Medicine

## 2011-05-05 LAB — COMPREHENSIVE METABOLIC PANEL
ALT: 32 U/L (ref 0–35)
AST: 24 U/L (ref 0–37)
Albumin: 4.1 g/dL (ref 3.5–5.2)
CO2: 21 mEq/L (ref 19–32)
Calcium: 8.8 mg/dL (ref 8.4–10.5)
Chloride: 107 mEq/L (ref 96–112)
Creatinine, Ser: 0.69 mg/dL (ref 0.50–1.10)
Potassium: 4 mEq/L (ref 3.5–5.3)
Sodium: 140 mEq/L (ref 135–145)
Total Protein: 7 g/dL (ref 6.0–8.3)

## 2011-05-05 LAB — CHROMOGRANIN A: Chromogranin A: 6.4 ng/mL (ref 1.9–15.0)

## 2011-05-06 ENCOUNTER — Telehealth: Payer: Self-pay | Admitting: *Deleted

## 2011-05-06 NOTE — Telephone Encounter (Signed)
Called patient to let her know that her labwork looked ok but her blood sugars were a little on the high side and to watch how much sugar she takes in/

## 2011-05-06 NOTE — Telephone Encounter (Signed)
Message copied by Anselm Jungling on Tue May 06, 2011  1:34 PM ------      Message from: Arlan Organ R      Created: Mon May 05, 2011  7:05 PM       Call her and tell her that her lobular looks okay although her blood sugar is on the high side. She needs to watch what how much sugars she takes into her diet.

## 2011-05-12 ENCOUNTER — Telehealth: Payer: Self-pay | Admitting: *Deleted

## 2011-05-12 NOTE — Telephone Encounter (Addendum)
Message copied by Mirian Capuchin on Mon May 12, 2011  4:21 PM ------      Message from: Arlan Organ R      Created: Thu May 08, 2011  9:10 PM       Call:  Labs look great!!  Leah [name]  Pt voiced understanding.

## 2011-05-14 ENCOUNTER — Ambulatory Visit (HOSPITAL_BASED_OUTPATIENT_CLINIC_OR_DEPARTMENT_OTHER): Payer: BC Managed Care – PPO

## 2011-05-14 DIAGNOSIS — C787 Secondary malignant neoplasm of liver and intrahepatic bile duct: Secondary | ICD-10-CM

## 2011-05-14 DIAGNOSIS — C7B8 Other secondary neuroendocrine tumors: Secondary | ICD-10-CM

## 2011-05-14 DIAGNOSIS — I10 Essential (primary) hypertension: Secondary | ICD-10-CM

## 2011-05-14 DIAGNOSIS — R197 Diarrhea, unspecified: Secondary | ICD-10-CM

## 2011-05-14 DIAGNOSIS — C7A00 Malignant carcinoid tumor of unspecified site: Secondary | ICD-10-CM

## 2011-05-14 MED ORDER — OCTREOTIDE ACETATE 30 MG IM KIT
30.0000 mg | PACK | Freq: Once | INTRAMUSCULAR | Status: AC
  Administered 2011-05-14: 30 mg via INTRAMUSCULAR

## 2011-05-28 ENCOUNTER — Ambulatory Visit (HOSPITAL_BASED_OUTPATIENT_CLINIC_OR_DEPARTMENT_OTHER): Payer: BC Managed Care – PPO

## 2011-05-28 ENCOUNTER — Ambulatory Visit: Payer: BC Managed Care – PPO

## 2011-05-28 VITALS — BP 135/70 | HR 98 | Temp 97.2°F

## 2011-05-28 DIAGNOSIS — C7B8 Other secondary neuroendocrine tumors: Secondary | ICD-10-CM

## 2011-05-28 DIAGNOSIS — C7A00 Malignant carcinoid tumor of unspecified site: Secondary | ICD-10-CM

## 2011-05-28 DIAGNOSIS — C787 Secondary malignant neoplasm of liver and intrahepatic bile duct: Secondary | ICD-10-CM

## 2011-05-28 MED ORDER — OCTREOTIDE ACETATE 30 MG IM KIT
30.0000 mg | PACK | Freq: Once | INTRAMUSCULAR | Status: AC
  Administered 2011-05-28: 30 mg via INTRAMUSCULAR

## 2011-06-11 ENCOUNTER — Ambulatory Visit (HOSPITAL_BASED_OUTPATIENT_CLINIC_OR_DEPARTMENT_OTHER): Payer: BC Managed Care – PPO

## 2011-06-11 VITALS — BP 128/93 | HR 115 | Temp 97.9°F

## 2011-06-11 DIAGNOSIS — C787 Secondary malignant neoplasm of liver and intrahepatic bile duct: Secondary | ICD-10-CM

## 2011-06-11 DIAGNOSIS — C7A00 Malignant carcinoid tumor of unspecified site: Secondary | ICD-10-CM

## 2011-06-11 DIAGNOSIS — C7B8 Other secondary neuroendocrine tumors: Secondary | ICD-10-CM

## 2011-06-11 MED ORDER — OCTREOTIDE ACETATE 30 MG IM KIT
30.0000 mg | PACK | Freq: Once | INTRAMUSCULAR | Status: AC
  Administered 2011-06-11: 30 mg via INTRAMUSCULAR

## 2011-06-11 NOTE — Patient Instructions (Signed)

## 2011-06-20 ENCOUNTER — Ambulatory Visit (HOSPITAL_BASED_OUTPATIENT_CLINIC_OR_DEPARTMENT_OTHER): Payer: BC Managed Care – PPO

## 2011-06-20 DIAGNOSIS — C787 Secondary malignant neoplasm of liver and intrahepatic bile duct: Secondary | ICD-10-CM

## 2011-06-20 DIAGNOSIS — I10 Essential (primary) hypertension: Secondary | ICD-10-CM

## 2011-06-20 DIAGNOSIS — R197 Diarrhea, unspecified: Secondary | ICD-10-CM

## 2011-06-20 DIAGNOSIS — C7B8 Other secondary neuroendocrine tumors: Secondary | ICD-10-CM

## 2011-06-20 DIAGNOSIS — C7A00 Malignant carcinoid tumor of unspecified site: Secondary | ICD-10-CM

## 2011-06-20 MED ORDER — OCTREOTIDE ACETATE 30 MG IM KIT
30.0000 mg | PACK | Freq: Once | INTRAMUSCULAR | Status: AC
  Administered 2011-06-20: 30 mg via INTRAMUSCULAR

## 2011-06-20 NOTE — Progress Notes (Signed)
Pt called requesting another Sandostatin injection today as "the one from last week didn't develop into a little ball as it usually does so I think it didn't absorb like it should have". She and her husband said she is having nausea, diarrhea, and hot flashes. Reviewed with Dr Myna Hidalgo. Ok to repeat despite having it last week. She was told that although her insurance does not require a prior authorization, she may end up having to pay for or some or all of the injection. Shaleta verbalized understanding and agreed to receive the injection on those terms.

## 2011-06-25 ENCOUNTER — Telehealth: Payer: Self-pay | Admitting: Hematology & Oncology

## 2011-06-25 ENCOUNTER — Ambulatory Visit: Payer: BC Managed Care – PPO

## 2011-06-25 NOTE — Telephone Encounter (Signed)
Pt called and cx 06/25/11 apt and resch for 07/02/11

## 2011-06-26 ENCOUNTER — Other Ambulatory Visit: Payer: Self-pay | Admitting: Internal Medicine

## 2011-07-02 ENCOUNTER — Ambulatory Visit: Payer: BC Managed Care – PPO

## 2011-07-02 ENCOUNTER — Other Ambulatory Visit: Payer: BC Managed Care – PPO | Admitting: Lab

## 2011-07-02 ENCOUNTER — Ambulatory Visit (HOSPITAL_BASED_OUTPATIENT_CLINIC_OR_DEPARTMENT_OTHER): Payer: BC Managed Care – PPO

## 2011-07-02 ENCOUNTER — Ambulatory Visit: Payer: BC Managed Care – PPO | Admitting: Hematology & Oncology

## 2011-07-02 VITALS — BP 122/72 | HR 87

## 2011-07-02 DIAGNOSIS — C7A00 Malignant carcinoid tumor of unspecified site: Secondary | ICD-10-CM

## 2011-07-02 DIAGNOSIS — C787 Secondary malignant neoplasm of liver and intrahepatic bile duct: Secondary | ICD-10-CM

## 2011-07-02 DIAGNOSIS — C7B8 Other secondary neuroendocrine tumors: Secondary | ICD-10-CM

## 2011-07-02 MED ORDER — OCTREOTIDE ACETATE 30 MG IM KIT
30.0000 mg | PACK | Freq: Once | INTRAMUSCULAR | Status: AC
  Administered 2011-07-02: 30 mg via INTRAMUSCULAR

## 2011-07-02 NOTE — Patient Instructions (Signed)

## 2011-07-09 ENCOUNTER — Other Ambulatory Visit (HOSPITAL_BASED_OUTPATIENT_CLINIC_OR_DEPARTMENT_OTHER): Payer: BC Managed Care – PPO | Admitting: Lab

## 2011-07-09 ENCOUNTER — Ambulatory Visit (HOSPITAL_BASED_OUTPATIENT_CLINIC_OR_DEPARTMENT_OTHER): Payer: BC Managed Care – PPO | Admitting: Hematology & Oncology

## 2011-07-09 ENCOUNTER — Ambulatory Visit: Payer: BC Managed Care – PPO

## 2011-07-09 VITALS — BP 140/92 | HR 85 | Temp 96.6°F | Ht 59.0 in | Wt 206.0 lb

## 2011-07-09 DIAGNOSIS — C787 Secondary malignant neoplasm of liver and intrahepatic bile duct: Secondary | ICD-10-CM

## 2011-07-09 DIAGNOSIS — C7A00 Malignant carcinoid tumor of unspecified site: Secondary | ICD-10-CM

## 2011-07-09 DIAGNOSIS — C7B8 Other secondary neuroendocrine tumors: Secondary | ICD-10-CM

## 2011-07-09 LAB — CBC WITH DIFFERENTIAL (CANCER CENTER ONLY)
BASO%: 0.2 % (ref 0.0–2.0)
EOS%: 1.4 % (ref 0.0–7.0)
LYMPH%: 25.4 % (ref 14.0–48.0)
MCH: 30.7 pg (ref 26.0–34.0)
MCHC: 34.1 g/dL (ref 32.0–36.0)
MCV: 90 fL (ref 81–101)
MONO#: 0.6 10*3/uL (ref 0.1–0.9)
MONO%: 7.2 % (ref 0.0–13.0)
NEUT%: 65.8 % (ref 39.6–80.0)
Platelets: 227 10*3/uL (ref 145–400)
RDW: 13.4 % (ref 11.1–15.7)
WBC: 8.3 10*3/uL (ref 3.9–10.0)

## 2011-07-09 NOTE — Progress Notes (Signed)
This office note has been dictated.

## 2011-07-10 NOTE — Progress Notes (Signed)
CC:   Leah [name]. Leah [name], M.D. Leah [name], M.D. Leah [name]. Leah [name], M.D. Leah [name], M.D. Leah Furlong, MD  DIAGNOSIS:  Metastatic neuroendocrine tumor, liver metastasis.  CURRENT THERAPY:  Sandostatin 30 mg IM q.2 weeks.  INTERIM HISTORY:  Leah [name] come in for followup.  She does feel much better. We have her on  Sandostatin every 2 weeks.  This made her feel more functional.  The problem she is having is with her left knee.  She needs to have left knee surgery.  Unfortunately, the surgeon will not operate on her until she loses weight.  As such, she has seen [facility] for the bariatric surgery.  I am not sure when this is going to take place but she is doing all that she needs to do to get the surgery taken care of.  She is having no problems with chest pain.  There have been no palpitations.  She has had no rashes.  Her last chromogranin A level back in March was 6.4.  PHYSICAL EXAMINATION:  This is a well-developed, well-nourished white female in no obvious distress.  Vital signs:  96.6, pulse 85, respiratory rate 20, blood pressure 140/92.  Weight is 206.  Head and neck:  Normocephalic, atraumatic skull.  There are no ocular or oral lesions.  There are no palpable cervical or supraclavicular lymph nodes. Lungs:  Clear bilaterally.  Cardiac:  Regular rate and rhythm with a normal S1 and S2.  There are no murmurs, rubs or bruits.  Abdomen: Soft.  She has well-healed laparotomy scars.  There is no fluid wave. There is no guarding or rebound tenderness.  There is no palpable hepatosplenomegaly.  Extremities:  No clubbing, cyanosis or edema.  LABORATORY STUDIES:  White cell count is 8.3, hemoglobin 12.6, hematocrit 37, platelet count 227.  IMPRESSION:  Leah [name] is a 51 year old white female with metastatic neuroendocrine tumor.  From my point of view, I think she is holding her own.  She goes back to [facility] in August, I think, for another MRI.  She is being  followed by Dr. Flonnie Hailstone at [facility].  We will go ahead and keep her on her 2 week schedule for the Sandostatin.  This seems to work very well for her.  I will plan to see her back in about 6 weeks' time.    ______________________________ Josph Macho, M.D. PRE/MEDQ  D:  07/09/2011  T:  07/10/2011  Job:  [id number]

## 2011-07-16 ENCOUNTER — Ambulatory Visit (HOSPITAL_BASED_OUTPATIENT_CLINIC_OR_DEPARTMENT_OTHER): Payer: BC Managed Care – PPO

## 2011-07-16 ENCOUNTER — Telehealth: Payer: Self-pay

## 2011-07-16 DIAGNOSIS — C7A00 Malignant carcinoid tumor of unspecified site: Secondary | ICD-10-CM

## 2011-07-16 DIAGNOSIS — C787 Secondary malignant neoplasm of liver and intrahepatic bile duct: Secondary | ICD-10-CM

## 2011-07-16 DIAGNOSIS — C7B8 Other secondary neuroendocrine tumors: Secondary | ICD-10-CM

## 2011-07-16 LAB — COMPREHENSIVE METABOLIC PANEL
AST: 19 U/L (ref 0–37)
Albumin: 4.1 g/dL (ref 3.5–5.2)
BUN: 24 mg/dL — ABNORMAL HIGH (ref 6–23)
Calcium: 8.9 mg/dL (ref 8.4–10.5)
Chloride: 107 mEq/L (ref 96–112)
Glucose, Bld: 96 mg/dL (ref 70–99)
Potassium: 3.8 mEq/L (ref 3.5–5.3)
Sodium: 141 mEq/L (ref 135–145)
Total Protein: 6.4 g/dL (ref 6.0–8.3)

## 2011-07-16 MED ORDER — OCTREOTIDE ACETATE 30 MG IM KIT
30.0000 mg | PACK | Freq: Once | INTRAMUSCULAR | Status: AC
  Administered 2011-07-16: 30 mg via INTRAMUSCULAR

## 2011-07-16 NOTE — Telephone Encounter (Signed)
Patient advised that Dr. Lenord Fellers does not think Bariatric surgery is a good idea and will not sign off on the forms, due to the fact that she has so many medical problems, left message for Dr. Gustavo Lah nurse as well.

## 2011-07-16 NOTE — Progress Notes (Signed)
Pt refused vitals sign check

## 2011-07-16 NOTE — Patient Instructions (Signed)

## 2011-07-18 ENCOUNTER — Telehealth: Payer: Self-pay | Admitting: Internal Medicine

## 2011-07-18 NOTE — Telephone Encounter (Signed)
Patient is requesting paperwork be filled out have gastric bypass surgery. Says orthopedist indicated her she needed to lose weight because of a knee condition. I am not convinced patient has given diet and exercise a try. Also she has a history of neuroendocrine tumor treated by Dr. Rosemarie Beath and also having had treatment at [facility] She remains on Sandostatin to control the neuroendocrine tumor. I do not think it's in her best interest to have gastric bypass surgery. I have communicated that to this patient into her husband. I have also called Dr. Gustavo Lah nurse and discussed that with her as well. Therefore I'm declining to fill out the paperwork as there is a letter that indicates I am in support of her having gastric Bypass surgery which I will not sign

## 2011-07-23 ENCOUNTER — Ambulatory Visit: Payer: BC Managed Care – PPO

## 2011-07-30 ENCOUNTER — Ambulatory Visit (HOSPITAL_BASED_OUTPATIENT_CLINIC_OR_DEPARTMENT_OTHER): Payer: BC Managed Care – PPO

## 2011-07-30 VITALS — BP 142/90 | HR 92 | Temp 98.7°F

## 2011-07-30 DIAGNOSIS — C7A00 Malignant carcinoid tumor of unspecified site: Secondary | ICD-10-CM

## 2011-07-30 DIAGNOSIS — C7B8 Other secondary neuroendocrine tumors: Secondary | ICD-10-CM

## 2011-07-30 DIAGNOSIS — C787 Secondary malignant neoplasm of liver and intrahepatic bile duct: Secondary | ICD-10-CM

## 2011-07-30 MED ORDER — OCTREOTIDE ACETATE 30 MG IM KIT
30.0000 mg | PACK | Freq: Once | INTRAMUSCULAR | Status: AC
  Administered 2011-07-30: 30 mg via INTRAMUSCULAR

## 2011-07-30 NOTE — Patient Instructions (Signed)

## 2011-08-06 ENCOUNTER — Ambulatory Visit: Payer: BC Managed Care – PPO

## 2011-08-13 ENCOUNTER — Other Ambulatory Visit: Payer: BC Managed Care – PPO | Admitting: Lab

## 2011-08-13 ENCOUNTER — Ambulatory Visit (HOSPITAL_BASED_OUTPATIENT_CLINIC_OR_DEPARTMENT_OTHER): Payer: BC Managed Care – PPO

## 2011-08-13 ENCOUNTER — Ambulatory Visit: Payer: BC Managed Care – PPO

## 2011-08-13 ENCOUNTER — Ambulatory Visit (HOSPITAL_BASED_OUTPATIENT_CLINIC_OR_DEPARTMENT_OTHER): Payer: BC Managed Care – PPO | Admitting: Hematology & Oncology

## 2011-08-13 VITALS — BP 141/83 | HR 82 | Temp 97.1°F | Ht 59.0 in | Wt 205.0 lb

## 2011-08-13 DIAGNOSIS — C787 Secondary malignant neoplasm of liver and intrahepatic bile duct: Secondary | ICD-10-CM

## 2011-08-13 DIAGNOSIS — C7B8 Other secondary neuroendocrine tumors: Secondary | ICD-10-CM

## 2011-08-13 DIAGNOSIS — C7A00 Malignant carcinoid tumor of unspecified site: Secondary | ICD-10-CM

## 2011-08-13 LAB — CBC WITH DIFFERENTIAL (CANCER CENTER ONLY)
BASO#: 0 10*3/uL (ref 0.0–0.2)
Eosinophils Absolute: 0.1 10*3/uL (ref 0.0–0.5)
HCT: 38.5 % (ref 34.8–46.6)
HGB: 12.9 g/dL (ref 11.6–15.9)
LYMPH#: 2.3 10*3/uL (ref 0.9–3.3)
MCH: 30.6 pg (ref 26.0–34.0)
NEUT#: 5.5 10*3/uL (ref 1.5–6.5)
NEUT%: 64 % (ref 39.6–80.0)
RBC: 4.22 10*6/uL (ref 3.70–5.32)

## 2011-08-13 MED ORDER — OCTREOTIDE ACETATE 30 MG IM KIT
30.0000 mg | PACK | Freq: Once | INTRAMUSCULAR | Status: AC
  Administered 2011-08-13: 30 mg via INTRAMUSCULAR

## 2011-08-13 NOTE — Progress Notes (Signed)
This office note has been dictated.

## 2011-08-14 NOTE — Progress Notes (Signed)
CC:   Luanna Cole. Lenord Fellers, M.D. Deveron Furlong, MD Ollen Gross, M.D. Sandria Bales. Ezzard Standing, M.D. Petra Kuba, M.D.  DIAGNOSIS:  Metastatic neuroendocrine tumor, liver metastasis.  CURRENT THERAPY:  Sandostatin LAR 30 mg IM q.2 weeks.  INTERIM HISTORY:  Ms. Taussig comes in for followup.  The q.2 week Sandostatin is working quite well for her.  She feels a lot better.  She is not having any issues with the diarrhea that she had before.  Unfortunately, she is still having problems with her left knee.  She needs to lose some weight before she can undergo surgery.  Her family doctor, Dr. Lenord Fellers, does not feel that she is medically ready for bariatric surgery.  As such, Ms. Kamrowski is approaching weight loss with a good and determined attitude of diligence and restraint.  We do follow her chromogranin A levels.  They do tend to bounce "up and down."  Her last chromogranin A level was 4.2 back in September.  She has had no fever.  She has had no sweats.  She has had no flushing. There has been no wheezing.  She has had no bleeding or bruising.  PHYSICAL EXAMINATION:  This is a well-developed, well-nourished white female in no obvious distress.  Vital signs: 97.1, pulse 82, respiratory rate 20, blood pressure 141/83.  Weight is 205.  Head and neck: Normocephalic, atraumatic skull.  There are no ocular or oral lesions. There are no palpable cervical or supraclavicular lymph nodes.  Lungs: Clear bilaterally.  Cardiac:  Regular rate and rhythm with normal S1 and S2.  There are no murmurs, rubs or bruits.  Abdomen:  Soft with good bowel sounds.  There is no palpable abdominal mass.  She has a well- healed laparotomy scar.  There is no fluid wave.  There is no palpable hepatosplenomegaly.  Extremities:  Some slight swelling and decreased range of motion of the left knee.  She has good pulses in her distal extremities.  Skin:  No rash, ecchymosis or petechia.  LABORATORY STUDIES:  White cell count is 8.6,  hemoglobin 12.9, hematocrit 38.5, platelet count 227.  IMPRESSION:  Ms. Schwebach is a 51 year old white female with metastatic neuroendocrine malignancy.  She did undergo a partial resection at [facility].  She is on Sandostatin.  She has really done well from my point of view.  Her chromogranin A level has remained low.  I know she still goes back to see Dr. Flonnie Hailstone at [facility].  They may be doing some scans out there.  I will plan to see her back myself in about 3 months now.  She continues to come in every 2 weeks for her Sandostatin.    ______________________________ Josph Macho, M.D. PRE/MEDQ  D:  08/13/2011  T:  08/14/2011  Job:  [id number]

## 2011-08-19 LAB — COMPREHENSIVE METABOLIC PANEL
ALT: 33 U/L (ref 0–35)
Albumin: 4.2 g/dL (ref 3.5–5.2)
CO2: 21 mEq/L (ref 19–32)
Calcium: 9.1 mg/dL (ref 8.4–10.5)
Chloride: 106 mEq/L (ref 96–112)
Glucose, Bld: 108 mg/dL — ABNORMAL HIGH (ref 70–99)
Sodium: 138 mEq/L (ref 135–145)
Total Protein: 7 g/dL (ref 6.0–8.3)

## 2011-08-19 LAB — LACTATE DEHYDROGENASE: LDH: 196 U/L (ref 94–250)

## 2011-08-20 ENCOUNTER — Ambulatory Visit: Payer: BC Managed Care – PPO

## 2011-08-27 ENCOUNTER — Ambulatory Visit (HOSPITAL_BASED_OUTPATIENT_CLINIC_OR_DEPARTMENT_OTHER): Payer: BC Managed Care – PPO

## 2011-08-27 VITALS — BP 166/92 | HR 92 | Temp 97.2°F

## 2011-08-27 DIAGNOSIS — C787 Secondary malignant neoplasm of liver and intrahepatic bile duct: Secondary | ICD-10-CM

## 2011-08-27 DIAGNOSIS — C7A00 Malignant carcinoid tumor of unspecified site: Secondary | ICD-10-CM

## 2011-08-27 DIAGNOSIS — C7B8 Other secondary neuroendocrine tumors: Secondary | ICD-10-CM

## 2011-08-27 MED ORDER — OCTREOTIDE ACETATE 30 MG IM KIT
30.0000 mg | PACK | Freq: Once | INTRAMUSCULAR | Status: AC
  Administered 2011-08-27: 30 mg via INTRAMUSCULAR

## 2011-09-10 ENCOUNTER — Ambulatory Visit (HOSPITAL_BASED_OUTPATIENT_CLINIC_OR_DEPARTMENT_OTHER): Payer: BC Managed Care – PPO

## 2011-09-10 VITALS — BP 141/96 | HR 94 | Temp 97.0°F

## 2011-09-10 DIAGNOSIS — C7B8 Other secondary neuroendocrine tumors: Secondary | ICD-10-CM

## 2011-09-10 DIAGNOSIS — C7A00 Malignant carcinoid tumor of unspecified site: Secondary | ICD-10-CM

## 2011-09-10 DIAGNOSIS — C787 Secondary malignant neoplasm of liver and intrahepatic bile duct: Secondary | ICD-10-CM

## 2011-09-10 MED ORDER — OCTREOTIDE ACETATE 30 MG IM KIT
30.0000 mg | PACK | Freq: Once | INTRAMUSCULAR | Status: AC
  Administered 2011-09-10: 30 mg via INTRAMUSCULAR

## 2011-09-10 NOTE — Patient Instructions (Signed)

## 2011-09-18 ENCOUNTER — Other Ambulatory Visit: Payer: Self-pay | Admitting: Internal Medicine

## 2011-09-23 ENCOUNTER — Encounter (HOSPITAL_COMMUNITY): Payer: Self-pay | Admitting: Emergency Medicine

## 2011-09-23 ENCOUNTER — Emergency Department (HOSPITAL_COMMUNITY)
Admission: EM | Admit: 2011-09-23 | Discharge: 2011-09-23 | Disposition: A | Discharge status: Home / Self Care | Payer: BC Managed Care – PPO | Attending: Emergency Medicine | Admitting: Emergency Medicine

## 2011-09-23 DIAGNOSIS — J3089 Other allergic rhinitis: Secondary | ICD-10-CM | POA: Insufficient documentation

## 2011-09-23 DIAGNOSIS — D869 Sarcoidosis, unspecified: Secondary | ICD-10-CM | POA: Insufficient documentation

## 2011-09-23 DIAGNOSIS — T7840XA Allergy, unspecified, initial encounter: Secondary | ICD-10-CM

## 2011-09-23 DIAGNOSIS — I10 Essential (primary) hypertension: Secondary | ICD-10-CM | POA: Insufficient documentation

## 2011-09-23 DIAGNOSIS — K219 Gastro-esophageal reflux disease without esophagitis: Secondary | ICD-10-CM | POA: Insufficient documentation

## 2011-09-23 DIAGNOSIS — L2989 Other pruritus: Secondary | ICD-10-CM | POA: Insufficient documentation

## 2011-09-23 DIAGNOSIS — L298 Other pruritus: Secondary | ICD-10-CM | POA: Insufficient documentation

## 2011-09-23 MED ORDER — PREDNISONE 10 MG PO TABS: 20.0000 mg | ORAL_TABLET | Freq: Every day | ORAL | Status: DC

## 2011-09-23 MED ORDER — FAMOTIDINE 20 MG PO TABS: 20.0000 mg | ORAL_TABLET | Freq: Two times a day (BID) | ORAL | Status: DC

## 2011-09-23 NOTE — ED Provider Notes (Signed)
History     CSN: [id number]  Arrival date & time 09/23/11  1417   First MD Initiated Contact with Patient 09/23/11 1534      Chief Complaint  Patient presents with  . Pruritis    (Consider location/radiation/quality/duration/timing/severity/associated sxs/prior treatment) HPI  51 year old female presents c/o itch. Pt reports she has hx of allergy and usually takes Allegra daily.  She did not take it for the past 2 days.  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  Last night she begins to develop itch which started at her lower back.  She knows that the itch will worsen and would like to be treated for it. Sts she usually develop hives without treatment. Denies fever, throat swelling, cp, sob, n/v, or abd pain.  Denies medication changes or any other environmental changes except for the aforementioned.  Is not a diabetic.  Has just started taking Allegra again today.   Past Medical History  Diagnosis Date  . Sarcoidosis   . Allergic rhinitis   . GE reflux   . Neuroendocrine tumor   . HTN (hypertension)     Past Surgical History  Procedure Date  . Tonsillectomy 1976  . Cesarean section [id number]  . Cholecystectomy 2003  . Abdominal hysterectomy 2004  . Oophorectomy 2004  . Mastectomy 2008    bilateral  . Bilateral mastectectomy   . Latissimus dorsi graft     used for reconstructive breast surgery  . Hepatetetomy     Family History  Problem Relation Age of Onset  . Arthritis Maternal Grandmother   . Arthritis Maternal Grandfather   . Arthritis Paternal Grandmother   . Arthritis Paternal Grandfather     History  Substance Use Topics  . Smoking status: Never Smoker   . Smokeless tobacco: Never Used  . Alcohol Use: 0.5 oz/week    1 drink(s) per week     about 1x q 3 months    OB History    Grav Para Term Preterm Abortions TAB SAB Ect Mult Living                  Review of Systems    Constitutional: Negative for fever.  Respiratory: Negative for chest tightness and shortness of breath.   Skin: Positive for rash.  Neurological: Negative for numbness and headaches.  All other systems reviewed and are negative.    Allergies  Review of patient's allergies indicates no known allergies.  Home Medications   Current Outpatient Rx  Name Route Sig Dispense Refill  . AMLODIPINE BESYLATE 5 MG PO TABS Oral Take 1 tablet (5 mg total) by mouth daily. 90 tablet 4  . DIOVAN 160 MG PO TABS  TAKE 1 TABLET DAILY 90 tablet 3  . FEXOFENADINE HCL 180 MG PO TABS Oral Take 1 tablet (180 mg total) by mouth daily. 30 tablet PRN  . IBUPROFEN 600 MG PO TABS      . KRISTALOSE 20 G PO PACK  20 g as needed.     Marland Kitchen LETROZOLE 2.5 MG PO TABS Oral Take 2.5 mg by mouth daily.      Marland Kitchen METHOCARBAMOL 500 MG PO TABS  as needed.     Marland Kitchen METRONIDAZOLE 0.75 % EX CREA  0.75 mg.    . MONTELUKAST SODIUM 10 MG PO TABS  TAKE 1 TABLET AT BEDTIME 90 tablet 3  . ONDANSETRON HCL 4 MG PO TABS Oral Take 4 mg  by mouth Every 12 hours as needed.    Marland Kitchen PANTOPRAZOLE SODIUM 40 MG PO TBEC  TAKE 1 TABLET TWO TIMES A DAY 180 tablet 2  . PROVENTIL HFA 108 (90 BASE) MCG/ACT IN AERS  INHALE 2 PUFFS FOUR TIMES A DAY AS NEEDED 6.7 g 3  . TRAZODONE HCL 100 MG PO TABS  TAKE 1 TABLET AT BEDTIME 90 tablet 2  . TRIAMCINOLONE ACETONIDE 0.1 % EX CREA Topical Apply topically 3 (three) times daily. prn 45 g 1  . VAGIFEM 10 MCG VA TABS  10 mcg.    Marland Kitchen VALACYCLOVIR HCL 500 MG PO TABS  TAKE 1 TABLET TWO TIMES A DAY 180 tablet 2  . VENLAFAXINE HCL ER 150 MG PO CP24  TAKE ONE CAPSULE BY MOUTH EVERY DAY 30 capsule 5  . XIFAXAN 550 MG PO TABS Oral Take 550 mg by mouth as needed.       BP 148/83  Pulse 86  Temp 99.2 F (37.3 C) (Oral)  Resp 18  SpO2 97%  Physical Exam  Nursing note and vitals reviewed. Constitutional: She is oriented to person, place, and time. She appears well-developed and well-nourished. No distress.  HENT:  Head:  Atraumatic.  Mouth/Throat: Oropharynx is clear and moist.  Eyes: Conjunctivae are normal.  Neck: Neck supple. No JVD present. No tracheal deviation present.  Cardiovascular: Normal rate and regular rhythm.   Pulmonary/Chest: Breath sounds normal. No respiratory distress. She has no wheezes. She exhibits no tenderness.  Abdominal: Soft. There is no tenderness.  Musculoskeletal: Normal range of motion. She exhibits no edema.  Lymphadenopathy:    She has no cervical adenopathy.  Neurological: She is alert and oriented to person, place, and time.  Skin: Skin is warm.       Excoriation marks noted to lower back.  No evidence of hives on skin.  No obvious rash.  No petechial, pustular, or vesicular lesions    ED Course  Procedures (including critical care time)  Labs Reviewed - No data to display No results found.   No diagnosis found.  1. Pruritus 2. Allergic reaction  MDM  Pt allergic to dust and different environmental irritants.  She has not developed hives yet.  No airway compromise.  Plan to give prednisone.  Pt has been taking benadryl, which i recommend continuing as needed.  Will also prescribe pepcid.          Fayrene Helper, PA-C 09/23/11 1550

## 2011-09-23 NOTE — ED Notes (Signed)
Itching on lower back started last night, in past has turned into hives, this is like her "prodromal sign" prior to hives,

## 2011-09-23 NOTE — ED Provider Notes (Signed)
Medical screening examination/treatment/procedure(s) were performed by non-physician practitioner and as supervising physician I was immediately available for consultation/collaboration.  Guinevere Stephenson T Stefanee Mckell, MD 09/23/11 2346 

## 2011-09-24 ENCOUNTER — Ambulatory Visit (HOSPITAL_BASED_OUTPATIENT_CLINIC_OR_DEPARTMENT_OTHER): Payer: BC Managed Care – PPO

## 2011-09-24 VITALS — BP 163/85 | HR 72 | Temp 97.0°F | Resp 18

## 2011-09-24 DIAGNOSIS — C787 Secondary malignant neoplasm of liver and intrahepatic bile duct: Secondary | ICD-10-CM

## 2011-09-24 DIAGNOSIS — C7A00 Malignant carcinoid tumor of unspecified site: Secondary | ICD-10-CM

## 2011-09-24 DIAGNOSIS — C7B8 Other secondary neuroendocrine tumors: Secondary | ICD-10-CM

## 2011-09-24 MED ORDER — OCTREOTIDE ACETATE 30 MG IM KIT
30.0000 mg | PACK | Freq: Once | INTRAMUSCULAR | Status: AC
  Administered 2011-09-24: 30 mg via INTRAMUSCULAR

## 2011-09-24 NOTE — Patient Instructions (Signed)

## 2011-10-01 ENCOUNTER — Encounter: Payer: Self-pay | Admitting: *Deleted

## 2011-10-02 ENCOUNTER — Ambulatory Visit (HOSPITAL_BASED_OUTPATIENT_CLINIC_OR_DEPARTMENT_OTHER): Payer: BC Managed Care – PPO

## 2011-10-02 VITALS — BP 148/98 | HR 100 | Temp 97.4°F | Resp 18

## 2011-10-02 DIAGNOSIS — C787 Secondary malignant neoplasm of liver and intrahepatic bile duct: Secondary | ICD-10-CM

## 2011-10-02 DIAGNOSIS — C7B8 Other secondary neuroendocrine tumors: Secondary | ICD-10-CM

## 2011-10-02 DIAGNOSIS — C7A00 Malignant carcinoid tumor of unspecified site: Secondary | ICD-10-CM

## 2011-10-02 MED ORDER — OCTREOTIDE ACETATE 30 MG IM KIT
30.0000 mg | PACK | Freq: Once | INTRAMUSCULAR | Status: AC
  Administered 2011-10-02: 30 mg via INTRAMUSCULAR

## 2011-10-02 NOTE — Patient Instructions (Signed)

## 2011-10-08 ENCOUNTER — Ambulatory Visit: Payer: BC Managed Care – PPO

## 2011-10-08 ENCOUNTER — Ambulatory Visit: Payer: BC Managed Care – PPO | Admitting: Medical

## 2011-10-08 ENCOUNTER — Other Ambulatory Visit: Payer: BC Managed Care – PPO | Admitting: Lab

## 2011-10-15 ENCOUNTER — Ambulatory Visit (HOSPITAL_BASED_OUTPATIENT_CLINIC_OR_DEPARTMENT_OTHER): Payer: BC Managed Care – PPO

## 2011-10-15 ENCOUNTER — Other Ambulatory Visit (HOSPITAL_BASED_OUTPATIENT_CLINIC_OR_DEPARTMENT_OTHER): Payer: BC Managed Care – PPO | Admitting: Lab

## 2011-10-15 ENCOUNTER — Ambulatory Visit (HOSPITAL_BASED_OUTPATIENT_CLINIC_OR_DEPARTMENT_OTHER): Payer: BC Managed Care – PPO | Admitting: Medical

## 2011-10-15 VITALS — BP 135/91 | HR 101 | Temp 98.6°F | Resp 20 | Ht 59.0 in | Wt 202.0 lb

## 2011-10-15 DIAGNOSIS — C787 Secondary malignant neoplasm of liver and intrahepatic bile duct: Secondary | ICD-10-CM

## 2011-10-15 DIAGNOSIS — C7A00 Malignant carcinoid tumor of unspecified site: Secondary | ICD-10-CM

## 2011-10-15 DIAGNOSIS — C7B8 Other secondary neuroendocrine tumors: Secondary | ICD-10-CM

## 2011-10-15 LAB — CBC WITH DIFFERENTIAL (CANCER CENTER ONLY)
BASO%: 0.2 % (ref 0.0–2.0)
LYMPH%: 30.2 % (ref 14.0–48.0)
MCV: 90 fL (ref 81–101)
MONO#: 0.6 10*3/uL (ref 0.1–0.9)
Platelets: 251 10*3/uL (ref 145–400)
RDW: 13.3 % (ref 11.1–15.7)
WBC: 9.4 10*3/uL (ref 3.9–10.0)

## 2011-10-15 MED ORDER — OCTREOTIDE ACETATE 30 MG IM KIT
30.0000 mg | PACK | Freq: Once | INTRAMUSCULAR | Status: AC
  Administered 2011-10-15: 30 mg via INTRAMUSCULAR

## 2011-10-15 NOTE — Progress Notes (Signed)
Diagnosis: Metastatic neuroendocrine tumor, liver metastasis.  Current therapy: Sandostatin LAR 30 mg IM every 2 weeks.  Interim history: Ms. Kohut presents today for an office followup visit.  Overall, she, reports, that she's been doing relatively well.  She does have some intermittent diarrhea, however, it has significantly improved.  She reports, that she is doing water aerobics, now, and has lost 3 pounds.  She still continues to followup with Dr. Flonnie Hailstone at [facility].  She reports, that she just had an MRI done at [facility] yesterday.  We do follow up chromogranin A. Level. on her which has remained low.  The last one week.was 2.8.  She does not report any, fevers, chills, or night sweats.  She does have some intermittent flushing.  She denies any cough, chest pain, or shortness of breath.  She denies any abnormal or obvious, bleeding.  She denies any headaches, visual changes, or any rashes.  She has a good appetite.  She does have intermittent nausea, on occasion, but no vomiting.  She does report, that every now and then she will have some intermittent abdominal pain.  She otherwise is getting along quite well.  Review of Systems: Pt. Denies any changes in their vision, hearing, adenopathy, fevers, chills, nausea, vomiting, diarrhea, constipation, chest pain, shortness of breath, passing blood, passing out, blacking out,  any changes in skin, joints, neurologic or psychiatric except as noted.  Physical Exam: This is a 51 year old, well-developed, well-nourished, white female, in no obvious distress Vitals: Temperature 90.6 degrees, pulse 101, respirations 20, blood pressure 135/91, weight 202 pounds HEENT reveals a normocephalic, atraumatic skull, no scleral icterus, no oral lesions  Neck is supple without any cervical or supraclavicular adenopathy.  Lungs are clear to auscultation bilaterally. There are no wheezes, rales or rhonci Cardiac is regular rate and rhythm with a normal S1 and S2. There are  no murmurs, rubs, or bruits.  Abdomen is soft with good bowel sounds, there is no palpable mass. There is no palpable hepatosplenomegaly. There is no palpable fluid wave.  She has a well healed laparotomy scar. Musculoskeletal no tenderness of the spine, ribs, or hips.  Extremities there are no clubbing, cyanosis, or edema.  Skin no petechia, purpura or ecchymosis Neurologic is nonfocal.  Laboratory Data: White count 9.4, hemoglobin 13.0, hematocrit 30.0, platelets 251,000  Current Outpatient Prescriptions on File Prior to Visit  Medication Sig Dispense Refill  . albuterol (PROVENTIL HFA;VENTOLIN HFA) 108 (90 BASE) MCG/ACT inhaler Inhale 2 puffs into the lungs every 6 (six) hours as needed. For shortness of breath.      Marland Kitchen amLODipine (NORVASC) 5 MG tablet Take 1 tablet (5 mg total) by mouth daily.  90 tablet  4  . Diclofenac Sodium (PENNSAID) 1.5 % SOLN Place 40 drops onto the skin 4 (four) times daily as needed. For joint pain.      Marland Kitchen DIOVAN 160 MG tablet TAKE 1 TABLET DAILY  90 tablet  3  . famotidine (PEPCID) 20 MG tablet Take 1 tablet (20 mg total) by mouth 2 (two) times daily.  30 tablet  0  . fexofenadine (ALLEGRA) 180 MG tablet Take 1 tablet (180 mg total) by mouth daily.  30 tablet  PRN  . KRISTALOSE 20 G packet Take 20 g by mouth daily as needed. For high ammonia.      . methocarbamol (ROBAXIN) 500 MG tablet Take 500 mg by mouth 3 (three) times daily.       . metroNIDAZOLE (METROCREAM) 0.75 % cream Apply  1 application topically 2 (two) times daily as needed. For yeast.      . montelukast (SINGULAIR) 10 MG tablet TAKE 1 TABLET AT BEDTIME  90 tablet  3  . naproxen sodium (ANAPROX) 220 MG tablet Take 440 mg by mouth 2 (two) times daily as needed. For pain.      Marland Kitchen octreotide (SANDOSTATIN LAR) 30 MG injection Inject 30 mg into the muscle every 14 (fourteen) days.      . ondansetron (ZOFRAN) 4 MG tablet Take 4 mg by mouth every 8 (eight) hours as needed. For nausea.      . pantoprazole  (PROTONIX) 40 MG tablet TAKE 1 TABLET TWO TIMES A DAY  180 tablet  2  . predniSONE (DELTASONE) 10 MG tablet Take 2 tablets (20 mg total) by mouth daily.  15 tablet  0  . traZODone (DESYREL) 100 MG tablet Take 100 mg by mouth at bedtime as needed. For insomnia.      Marland Kitchen triamcinolone cream (KENALOG) 0.1 % Apply 1 application topically 3 (three) times daily as needed. For itching.      Marland Kitchen VAGIFEM 10 MCG TABS Take 10 mcg by mouth daily.       . valACYclovir (VALTREX) 500 MG tablet Take 500 mg by mouth 2 (two) times daily as needed. For outbreak.      . venlafaxine (EFFEXOR-XR) 150 MG 24 hr capsule TAKE ONE CAPSULE BY MOUTH EVERY DAY  30 capsule  5   Assessment/Plan:  This is a pleasant, 51 year old, white female, with the following issues:  #1.  Metastatic neuroendocrine malignancy.  She did undergo partial resection.  At [facility].  She remains on Sandostatin LAR every 2 weeks.  Overall, she has done quite well.  We will continue to check her chromogranin A. levels, which have remained low.  She still continues to followup with Dr. Flonnie Hailstone at [facility].   #2 followup-we will follow back up with Ms. Tangeman in 3 months, but before then should there be questions or concerns.

## 2011-10-15 NOTE — Patient Instructions (Signed)

## 2011-10-20 LAB — LACTATE DEHYDROGENASE: LDH: 234 U/L (ref 94–250)

## 2011-10-20 LAB — COMPREHENSIVE METABOLIC PANEL
ALT: 38 U/L — ABNORMAL HIGH (ref 0–35)
Alkaline Phosphatase: 89 U/L (ref 39–117)
CO2: 25 mEq/L (ref 19–32)
Sodium: 140 mEq/L (ref 135–145)
Total Bilirubin: 0.5 mg/dL (ref 0.3–1.2)
Total Protein: 7.1 g/dL (ref 6.0–8.3)

## 2011-10-21 DIAGNOSIS — C787 Secondary malignant neoplasm of liver and intrahepatic bile duct: Secondary | ICD-10-CM | POA: Insufficient documentation

## 2011-10-28 ENCOUNTER — Ambulatory Visit (HOSPITAL_BASED_OUTPATIENT_CLINIC_OR_DEPARTMENT_OTHER): Payer: BC Managed Care – PPO

## 2011-10-28 VITALS — BP 145/106 | HR 102 | Temp 97.4°F | Resp 18

## 2011-10-28 DIAGNOSIS — C7A00 Malignant carcinoid tumor of unspecified site: Secondary | ICD-10-CM

## 2011-10-28 DIAGNOSIS — C787 Secondary malignant neoplasm of liver and intrahepatic bile duct: Secondary | ICD-10-CM

## 2011-10-28 DIAGNOSIS — C7B8 Other secondary neuroendocrine tumors: Secondary | ICD-10-CM

## 2011-10-28 MED ORDER — OCTREOTIDE ACETATE 30 MG IM KIT
30.0000 mg | PACK | Freq: Once | INTRAMUSCULAR | Status: AC
  Administered 2011-10-28: 30 mg via INTRAMUSCULAR

## 2011-10-28 NOTE — Patient Instructions (Signed)

## 2011-10-29 ENCOUNTER — Ambulatory Visit: Payer: BC Managed Care – PPO

## 2011-11-04 ENCOUNTER — Other Ambulatory Visit: Payer: Self-pay | Admitting: Internal Medicine

## 2011-11-11 ENCOUNTER — Ambulatory Visit (HOSPITAL_BASED_OUTPATIENT_CLINIC_OR_DEPARTMENT_OTHER): Payer: BC Managed Care – PPO

## 2011-11-11 VITALS — BP 150/98 | HR 96 | Resp 18

## 2011-11-11 DIAGNOSIS — C7A00 Malignant carcinoid tumor of unspecified site: Secondary | ICD-10-CM

## 2011-11-11 DIAGNOSIS — C7B8 Other secondary neuroendocrine tumors: Secondary | ICD-10-CM

## 2011-11-11 DIAGNOSIS — C787 Secondary malignant neoplasm of liver and intrahepatic bile duct: Secondary | ICD-10-CM

## 2011-11-11 MED ORDER — OCTREOTIDE ACETATE 30 MG IM KIT
30.0000 mg | PACK | Freq: Once | INTRAMUSCULAR | Status: AC
  Administered 2011-11-11: 30 mg via INTRAMUSCULAR

## 2011-11-11 NOTE — Patient Instructions (Signed)

## 2011-11-12 ENCOUNTER — Ambulatory Visit: Payer: BC Managed Care – PPO

## 2011-11-21 ENCOUNTER — Other Ambulatory Visit: Payer: Self-pay | Admitting: Internal Medicine

## 2011-11-21 ENCOUNTER — Ambulatory Visit (INDEPENDENT_AMBULATORY_CARE_PROVIDER_SITE_OTHER): Payer: BC Managed Care – PPO | Admitting: Internal Medicine

## 2011-11-21 DIAGNOSIS — Z23 Encounter for immunization: Secondary | ICD-10-CM

## 2011-11-25 ENCOUNTER — Ambulatory Visit (HOSPITAL_BASED_OUTPATIENT_CLINIC_OR_DEPARTMENT_OTHER): Payer: BC Managed Care – PPO

## 2011-11-25 VITALS — BP 147/106 | HR 92 | Temp 98.3°F | Resp 18

## 2011-11-25 DIAGNOSIS — C7B8 Other secondary neuroendocrine tumors: Secondary | ICD-10-CM

## 2011-11-25 DIAGNOSIS — C7A00 Malignant carcinoid tumor of unspecified site: Secondary | ICD-10-CM

## 2011-11-25 DIAGNOSIS — C787 Secondary malignant neoplasm of liver and intrahepatic bile duct: Secondary | ICD-10-CM

## 2011-11-25 MED ORDER — OCTREOTIDE ACETATE 30 MG IM KIT
30.0000 mg | PACK | Freq: Once | INTRAMUSCULAR | Status: AC
  Administered 2011-11-25: 30 mg via INTRAMUSCULAR

## 2011-11-26 ENCOUNTER — Ambulatory Visit: Payer: BC Managed Care – PPO

## 2011-12-09 ENCOUNTER — Ambulatory Visit (HOSPITAL_BASED_OUTPATIENT_CLINIC_OR_DEPARTMENT_OTHER): Payer: BC Managed Care – PPO

## 2011-12-09 VITALS — BP 155/87 | HR 79 | Temp 98.2°F | Resp 18

## 2011-12-09 DIAGNOSIS — C787 Secondary malignant neoplasm of liver and intrahepatic bile duct: Secondary | ICD-10-CM

## 2011-12-09 DIAGNOSIS — C7B8 Other secondary neuroendocrine tumors: Secondary | ICD-10-CM

## 2011-12-09 DIAGNOSIS — C7A00 Malignant carcinoid tumor of unspecified site: Secondary | ICD-10-CM

## 2011-12-09 MED ORDER — OCTREOTIDE ACETATE 30 MG IM KIT
30.0000 mg | PACK | Freq: Once | INTRAMUSCULAR | Status: AC
  Administered 2011-12-09: 30 mg via INTRAMUSCULAR

## 2011-12-09 NOTE — Patient Instructions (Signed)

## 2011-12-10 ENCOUNTER — Ambulatory Visit: Payer: BC Managed Care – PPO

## 2011-12-23 ENCOUNTER — Ambulatory Visit (HOSPITAL_BASED_OUTPATIENT_CLINIC_OR_DEPARTMENT_OTHER): Payer: BC Managed Care – PPO

## 2011-12-23 VITALS — BP 153/109 | HR 91 | Temp 98.5°F | Resp 18

## 2011-12-23 DIAGNOSIS — C787 Secondary malignant neoplasm of liver and intrahepatic bile duct: Secondary | ICD-10-CM

## 2011-12-23 DIAGNOSIS — C7A00 Malignant carcinoid tumor of unspecified site: Secondary | ICD-10-CM

## 2011-12-23 DIAGNOSIS — C7B8 Other secondary neuroendocrine tumors: Secondary | ICD-10-CM

## 2011-12-23 MED ORDER — OCTREOTIDE ACETATE 30 MG IM KIT
30.0000 mg | PACK | Freq: Once | INTRAMUSCULAR | Status: AC
  Administered 2011-12-23: 30 mg via INTRAMUSCULAR

## 2011-12-23 NOTE — Patient Instructions (Signed)

## 2011-12-24 ENCOUNTER — Ambulatory Visit: Payer: BC Managed Care – PPO

## 2012-01-07 ENCOUNTER — Ambulatory Visit: Payer: BC Managed Care – PPO | Admitting: Hematology & Oncology

## 2012-01-07 ENCOUNTER — Ambulatory Visit: Payer: BC Managed Care – PPO

## 2012-01-07 ENCOUNTER — Other Ambulatory Visit: Payer: BC Managed Care – PPO | Admitting: Lab

## 2012-01-09 ENCOUNTER — Ambulatory Visit (HOSPITAL_BASED_OUTPATIENT_CLINIC_OR_DEPARTMENT_OTHER): Payer: BC Managed Care – PPO

## 2012-01-09 ENCOUNTER — Other Ambulatory Visit (HOSPITAL_BASED_OUTPATIENT_CLINIC_OR_DEPARTMENT_OTHER): Payer: BC Managed Care – PPO

## 2012-01-09 ENCOUNTER — Ambulatory Visit (HOSPITAL_BASED_OUTPATIENT_CLINIC_OR_DEPARTMENT_OTHER): Payer: BC Managed Care – PPO | Admitting: Hematology & Oncology

## 2012-01-09 ENCOUNTER — Other Ambulatory Visit: Payer: BC Managed Care – PPO | Admitting: Lab

## 2012-01-09 VITALS — BP 155/96 | HR 97 | Temp 97.6°F | Resp 18

## 2012-01-09 DIAGNOSIS — C787 Secondary malignant neoplasm of liver and intrahepatic bile duct: Secondary | ICD-10-CM

## 2012-01-09 DIAGNOSIS — C7B8 Other secondary neuroendocrine tumors: Secondary | ICD-10-CM

## 2012-01-09 DIAGNOSIS — C7A00 Malignant carcinoid tumor of unspecified site: Secondary | ICD-10-CM

## 2012-01-09 LAB — CBC WITH DIFFERENTIAL (CANCER CENTER ONLY)
BASO#: 0 10*3/uL (ref 0.0–0.2)
EOS%: 1.8 % (ref 0.0–7.0)
HCT: 39.4 % (ref 34.8–46.6)
HGB: 13 g/dL (ref 11.6–15.9)
LYMPH%: 28.6 % (ref 14.0–48.0)
MCH: 30.7 pg (ref 26.0–34.0)
MCHC: 33 g/dL (ref 32.0–36.0)
MONO%: 5.9 % (ref 0.0–13.0)
NEUT%: 63.4 % (ref 39.6–80.0)

## 2012-01-09 MED ORDER — OCTREOTIDE ACETATE 30 MG IM KIT
30.0000 mg | PACK | Freq: Once | INTRAMUSCULAR | Status: AC
  Administered 2012-01-09: 30 mg via INTRAMUSCULAR

## 2012-01-09 NOTE — Progress Notes (Signed)
This office note has been dictated.

## 2012-01-09 NOTE — Progress Notes (Signed)
CC:   Leah [name]. Leah [name], M.D. Deveron Furlong, MD  DIAGNOSIS:  Metastatic neuroendocrine tumor with isolated liver mets.  CURRENT THERAPY:  Sandostatin LAR 30 mg IM q.2 weeks.  INTERIM HISTORY:  [name] comes in for followup.  She had a good day yesterday for Thanksgiving.  She ate without any problems.  There was no diarrhea.  She is doing water aerobics to help with her knees.  This has made a difference.  She has had no problems with flushing.  There has been some occasional abdominal discomfort but this has been more chronic than anything else.  Her last chromogranin A level in September was 6.4.  She does see Dr. Flonnie Hailstone at [facility].  She apparently had an MRI done recently.  I do not have those results.  Hopefully, they did not show any progression of her disease.  She has had no leg swelling.  There have been no rashes.  She does occasionally have some discomfort at the injection site for the Sandostatin.  She gets a little local reaction.  PHYSICAL EXAMINATION:  General:  This is a somewhat obese white female in no obvious distress.  Vital signs:  Pulse 97.6, pulse 97, respiratory rate 18, blood pressure 155/96.  Weight is 201.  Head and neck:  Shows a normocephalic, atraumatic skull.  There are no ocular or oral lesions. There are no palpable cervical or supraclavicular lymph nodes.  Lungs: Clear bilaterally.  Cardiac:  Regular rate and rhythm with a normal S1 and S2.  There are no murmurs, rubs or bruits.  Abdomen:  Shows a well- healed laparotomy scar.  There is no fluid wave.  There is no guarding or rebound tenderness.  There is no palpable hepatosplenomegaly. Extremities:  Show no clubbing, cyanosis or edema.  Skin:  No rashes. Neurological:  No focal neurological deficits.  LABORATORY STUDIES:  White cell count is 9, hemoglobin 13, hematocrit 39.4, platelet count 251.  IMPRESSION:  [name] is a very nice 51 year old white female with metastatic  neuroendocrine carcinoma.  She still has disease, but as far as I can tell, her disease has been fairly stable.  The Sandostatin every 2 weeks has helped her.  Her quality of life continues to be quite good without a lot of symptoms related to her underlying malignancy.  We will have her to continue to come in every 2 weeks for the Sandostatin.  We will see her back ourselves in 2 or 3 months.    ______________________________ Josph Macho, M.D. PRE/MEDQ  D:  01/09/2012  T:  01/09/2012  Job:  [id number]

## 2012-01-09 NOTE — Patient Instructions (Signed)

## 2012-01-12 LAB — COMPREHENSIVE METABOLIC PANEL
ALT: 31 U/L (ref 0–35)
Albumin: 4.3 g/dL (ref 3.5–5.2)
BUN: 14 mg/dL (ref 6–23)
Total Bilirubin: 0.5 mg/dL (ref 0.3–1.2)
Total Protein: 7.2 g/dL (ref 6.0–8.3)

## 2012-01-12 LAB — LACTATE DEHYDROGENASE: LDH: 199 U/L (ref 94–250)

## 2012-01-12 LAB — CHROMOGRANIN A: Chromogranin A: 3.4 ng/mL (ref 1.9–15.0)

## 2012-01-22 ENCOUNTER — Ambulatory Visit (HOSPITAL_BASED_OUTPATIENT_CLINIC_OR_DEPARTMENT_OTHER): Payer: BC Managed Care – PPO

## 2012-01-22 VITALS — BP 132/85 | HR 86 | Temp 98.4°F | Resp 20

## 2012-01-22 DIAGNOSIS — C7A00 Malignant carcinoid tumor of unspecified site: Secondary | ICD-10-CM

## 2012-01-22 DIAGNOSIS — C787 Secondary malignant neoplasm of liver and intrahepatic bile duct: Secondary | ICD-10-CM

## 2012-01-22 DIAGNOSIS — C7B8 Other secondary neuroendocrine tumors: Secondary | ICD-10-CM

## 2012-01-22 MED ORDER — OCTREOTIDE ACETATE 30 MG IM KIT
30.0000 mg | PACK | Freq: Once | INTRAMUSCULAR | Status: AC
  Administered 2012-01-22: 30 mg via INTRAMUSCULAR

## 2012-01-22 NOTE — Patient Instructions (Addendum)

## 2012-01-23 ENCOUNTER — Ambulatory Visit: Payer: BC Managed Care – PPO

## 2012-02-03 ENCOUNTER — Ambulatory Visit (HOSPITAL_BASED_OUTPATIENT_CLINIC_OR_DEPARTMENT_OTHER): Payer: BC Managed Care – PPO

## 2012-02-03 VITALS — BP 141/95 | HR 89 | Temp 97.7°F | Resp 18

## 2012-02-03 DIAGNOSIS — C7A00 Malignant carcinoid tumor of unspecified site: Secondary | ICD-10-CM

## 2012-02-03 DIAGNOSIS — C787 Secondary malignant neoplasm of liver and intrahepatic bile duct: Secondary | ICD-10-CM

## 2012-02-03 DIAGNOSIS — C7B8 Other secondary neuroendocrine tumors: Secondary | ICD-10-CM

## 2012-02-03 MED ORDER — OCTREOTIDE ACETATE 30 MG IM KIT
30.0000 mg | PACK | Freq: Once | INTRAMUSCULAR | Status: AC
  Administered 2012-02-03: 30 mg via INTRAMUSCULAR

## 2012-02-03 NOTE — Patient Instructions (Signed)

## 2012-02-05 ENCOUNTER — Telehealth: Payer: Self-pay | Admitting: Internal Medicine

## 2012-02-05 NOTE — Telephone Encounter (Signed)
I am not familiar with this cream and do not want to prescribe it.

## 2012-02-05 NOTE — Telephone Encounter (Signed)
Patient advised to use Abreva OTC instead of this new Rx cream for herpes simplex 1

## 2012-02-06 ENCOUNTER — Other Ambulatory Visit: Payer: Self-pay | Admitting: Internal Medicine

## 2012-02-06 ENCOUNTER — Ambulatory Visit: Payer: BC Managed Care – PPO

## 2012-02-17 ENCOUNTER — Ambulatory Visit (HOSPITAL_BASED_OUTPATIENT_CLINIC_OR_DEPARTMENT_OTHER): Payer: BC Managed Care – PPO

## 2012-02-17 DIAGNOSIS — C7A00 Malignant carcinoid tumor of unspecified site: Secondary | ICD-10-CM

## 2012-02-17 DIAGNOSIS — C7B8 Other secondary neuroendocrine tumors: Secondary | ICD-10-CM

## 2012-02-17 DIAGNOSIS — C787 Secondary malignant neoplasm of liver and intrahepatic bile duct: Secondary | ICD-10-CM

## 2012-02-17 MED ORDER — OCTREOTIDE ACETATE 30 MG IM KIT
30.0000 mg | PACK | Freq: Once | INTRAMUSCULAR | Status: AC
  Administered 2012-02-17: 30 mg via INTRAMUSCULAR

## 2012-02-17 NOTE — Patient Instructions (Signed)

## 2012-02-20 ENCOUNTER — Ambulatory Visit: Payer: BC Managed Care – PPO

## 2012-02-27 ENCOUNTER — Encounter: Payer: Self-pay | Admitting: *Deleted

## 2012-02-27 NOTE — Progress Notes (Signed)
Dr. Marlane Hatcher office called to report that Leah [name] did not keep her appt with Dr. Ewing Schlein

## 2012-03-02 ENCOUNTER — Ambulatory Visit (HOSPITAL_BASED_OUTPATIENT_CLINIC_OR_DEPARTMENT_OTHER): Payer: BC Managed Care – PPO

## 2012-03-02 VITALS — BP 136/93 | HR 90 | Temp 97.1°F | Resp 18

## 2012-03-02 DIAGNOSIS — C7A00 Malignant carcinoid tumor of unspecified site: Secondary | ICD-10-CM

## 2012-03-02 DIAGNOSIS — C787 Secondary malignant neoplasm of liver and intrahepatic bile duct: Secondary | ICD-10-CM

## 2012-03-02 DIAGNOSIS — C7B8 Other secondary neuroendocrine tumors: Secondary | ICD-10-CM

## 2012-03-02 MED ORDER — OCTREOTIDE ACETATE 30 MG IM KIT
30.0000 mg | PACK | Freq: Once | INTRAMUSCULAR | Status: AC
  Administered 2012-03-02: 30 mg via INTRAMUSCULAR

## 2012-03-02 NOTE — Patient Instructions (Signed)

## 2012-03-05 ENCOUNTER — Ambulatory Visit: Payer: BC Managed Care – PPO

## 2012-03-16 ENCOUNTER — Ambulatory Visit (HOSPITAL_BASED_OUTPATIENT_CLINIC_OR_DEPARTMENT_OTHER): Payer: BC Managed Care – PPO

## 2012-03-16 VITALS — BP 153/106 | HR 90 | Temp 97.3°F | Resp 16

## 2012-03-16 DIAGNOSIS — C7B8 Other secondary neuroendocrine tumors: Secondary | ICD-10-CM

## 2012-03-16 DIAGNOSIS — C787 Secondary malignant neoplasm of liver and intrahepatic bile duct: Secondary | ICD-10-CM

## 2012-03-16 DIAGNOSIS — C7A00 Malignant carcinoid tumor of unspecified site: Secondary | ICD-10-CM

## 2012-03-16 MED ORDER — OCTREOTIDE ACETATE 30 MG IM KIT
30.0000 mg | PACK | Freq: Once | INTRAMUSCULAR | Status: AC
  Administered 2012-03-16: 30 mg via INTRAMUSCULAR

## 2012-03-17 DIAGNOSIS — M25569 Pain in unspecified knee: Secondary | ICD-10-CM | POA: Insufficient documentation

## 2012-03-17 DIAGNOSIS — G8929 Other chronic pain: Secondary | ICD-10-CM | POA: Insufficient documentation

## 2012-03-19 ENCOUNTER — Ambulatory Visit: Payer: BC Managed Care – PPO

## 2012-03-30 ENCOUNTER — Ambulatory Visit (HOSPITAL_BASED_OUTPATIENT_CLINIC_OR_DEPARTMENT_OTHER): Payer: BC Managed Care – PPO

## 2012-03-30 ENCOUNTER — Ambulatory Visit (HOSPITAL_BASED_OUTPATIENT_CLINIC_OR_DEPARTMENT_OTHER): Payer: BC Managed Care – PPO | Admitting: Medical

## 2012-03-30 ENCOUNTER — Other Ambulatory Visit: Payer: BC Managed Care – PPO | Admitting: Lab

## 2012-03-30 VITALS — BP 141/96 | HR 101 | Temp 97.7°F | Resp 16 | Ht 59.0 in | Wt 208.0 lb

## 2012-03-30 DIAGNOSIS — C7B8 Other secondary neuroendocrine tumors: Secondary | ICD-10-CM

## 2012-03-30 DIAGNOSIS — C7A00 Malignant carcinoid tumor of unspecified site: Secondary | ICD-10-CM

## 2012-03-30 DIAGNOSIS — C787 Secondary malignant neoplasm of liver and intrahepatic bile duct: Secondary | ICD-10-CM

## 2012-03-30 LAB — CBC WITH DIFFERENTIAL (CANCER CENTER ONLY)
BASO#: 0 10*3/uL (ref 0.0–0.2)
BASO%: 0.4 % (ref 0.0–2.0)
EOS%: 2.1 % (ref 0.0–7.0)
HGB: 12.1 g/dL (ref 11.6–15.9)
MCH: 30.3 pg (ref 26.0–34.0)
MCHC: 33.1 g/dL (ref 32.0–36.0)
MONO%: 6.6 % (ref 0.0–13.0)
NEUT#: 4.5 10*3/uL (ref 1.5–6.5)
NEUT%: 58.3 % (ref 39.6–80.0)
RDW: 13.9 % (ref 11.1–15.7)

## 2012-03-30 MED ORDER — OCTREOTIDE ACETATE 30 MG IM KIT
30.0000 mg | PACK | Freq: Once | INTRAMUSCULAR | Status: AC
  Administered 2012-03-30: 30 mg via INTRAMUSCULAR

## 2012-03-30 NOTE — Progress Notes (Signed)
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  Current therapy: Sandostatin LAR 30 mg IM q. 2 weeks.  Interim history: Leah [name] comes in today for an office followup visit  overall, she, reports she's doing relatively well.  [facility] [employer]  She continues with water aerobics.  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  She continues on Sandostatin.  Every 2 weeks.  She does have some minor flushing and diarrhea for about a day.  She otherwise does quite well.  She still continues to followup with Dr. Flonnie Hailstone at [facility] every 6 months.  She, reports, that all her scans have been stable.  Unfortunately, we do not have a copy of those.  Her last chromogranin A. level was 3.4.  She reports, she has a good appetite.  She denies any nausea, vomiting, constipation, chest pain, shortness of breath, or cough.  She denies any fevers, chills, or night sweats.  She denies any abdominal pain.  She denies any lower leg swelling.  She denies any headaches, visual changes, or rashes.  Review of Systems: Constitutional:Negative for malaise/fatigue, fever, chills, weight loss, diaphoresis, activity change, appetite change, and unexpected weight change.  HEENT: Negative for double vision, blurred vision, visual loss, ear pain, tinnitus, congestion, rhinorrhea, epistaxis sore throat or sinus disease, oral pain/lesion, tongue soreness Respiratory: Negative for cough, chest tightness, shortness of breath, wheezing and stridor.  Cardiovascular: Negative for chest pain, palpitations, leg swelling, orthopnea, PND, DOE or claudication Gastrointestinal: Negative for nausea, vomiting, abdominal pain, diarrhea, constipation, blood in stool, melena, hematochezia, abdominal distention, anal bleeding, rectal pain, anorexia and hematemesis.  Genitourinary: Negative for dysuria, frequency, hematuria,  Musculoskeletal: Negative for myalgias, back pain, joint swelling,  arthralgias and gait problem.  Skin: Negative for rash, color change, pallor and wound.  Neurological:. Negative for dizziness/light-headedness, tremors, seizures, syncope, facial asymmetry, speech difficulty, weakness, numbness, headaches and paresthesias.  Hematological: Negative for adenopathy. Does not bruise/bleed easily.  Psychiatric/Behavioral:  Negative for depression, no loss of interest in normal activity or change in sleep pattern.   Physical Exam: This is a somewhat obese, 52 year old, female, in no obvious distress Vitals: temperature 97.7 degrees, pulse 101, respirations 16, blood pressure 141/96.  Weight 208 pounds HEENT reveals a normocephalic, atraumatic skull, no scleral icterus, no oral lesions  Neck is supple without any cervical or supraclavicular adenopathy.  Lungs are clear to auscultation bilaterally. There are no wheezes, rales or rhonci Cardiac is regular rate and rhythm with a normal S1 and S2. There are no murmurs, rubs, or bruits.  Abdomen is soft with good bowel sounds, there is no palpable mass. There is no palpable hepatosplenomegaly. There is no palpable fluid wave.  Musculoskeletal no tenderness of the spine, ribs, or hips.  Extremities there are no clubbing, cyanosis, or edema.  Skin no petechia, purpura or ecchymosis Neurologic is nonfocal.  Laboratory Data: White count 7.6, hemoglobin 12.1, hematocrit 36.6, platelets 213,000  Current Outpatient Prescriptions on File Prior to Visit  Medication Sig Dispense Refill  . albuterol (PROVENTIL HFA;VENTOLIN HFA) 108 (90 BASE) MCG/ACT inhaler Inhale 2 puffs into the lungs every 6 (six) hours as needed. For shortness of breath.      Marland Kitchen amLODipine (NORVASC) 5 MG tablet Take 1 tablet (5 mg total) by mouth daily.  90 tablet  4  . [employer] ALLERGY RELIEF 180 MG tablet TAKE 1 TABLET BY MOUTH EVERY DAY  30 tablet  11  . Diclofenac Sodium (PENNSAID) 1.5 %  SOLN Place 40 drops onto the skin 4 (four) times daily as needed. For  joint pain.      Marland Kitchen DIOVAN 160 MG tablet TAKE 1 TABLET DAILY  90 tablet  3  . famotidine (PEPCID) 20 MG tablet Take 1 tablet (20 mg total) by mouth 2 (two) times daily.  30 tablet  0  . KRISTALOSE 20 G packet Take 20 g by mouth daily as needed. For high ammonia.      . methocarbamol (ROBAXIN) 500 MG tablet Take 500 mg by mouth 3 (three) times daily.       . metroNIDAZOLE (METROCREAM) 0.75 % cream Apply 1 application topically 2 (two) times daily as needed. For yeast.      . montelukast (SINGULAIR) 10 MG tablet TAKE 1 TABLET AT BEDTIME  90 tablet  3  . naproxen sodium (ANAPROX) 220 MG tablet Take 440 mg by mouth 2 (two) times daily as needed. For pain.      Marland Kitchen octreotide (SANDOSTATIN LAR) 30 MG injection Inject 30 mg into the muscle every 14 (fourteen) days.      . ondansetron (ZOFRAN) 4 MG tablet Take 4 mg by mouth every 8 (eight) hours as needed. For nausea.      . pantoprazole (PROTONIX) 40 MG tablet TAKE 1 TABLET TWO TIMES A DAY  180 tablet  1  . Sulfacetamide Sodium-Sulfur 10-5 % EMUL as needed.       . traZODone (DESYREL) 100 MG tablet TAKE 1 TABLET AT BEDTIME  90 tablet  1  . triamcinolone cream (KENALOG) 0.1 % APPLY ON RASH THREE TIMES A DAY  30 g  1  . VAGIFEM 10 MCG TABS Place 10 mcg vaginally daily.       . valACYclovir (VALTREX) 500 MG tablet TAKE 1 TABLET TWO TIMES A DAY  180 tablet  1  . venlafaxine XR (EFFEXOR-XR) 150 MG 24 hr capsule TAKE 1 CAPSULE DAILY  90 capsule  3   No current facility-administered medications on file prior to visit.   Assessment/Plan: This is a pleasant, 52 year old, white female, with the following issues:  #1.  Metastatic neuroendocrine carcinoma.   She did undergo a partial resection at [facility].  She still has disease, but as far as we can tell, her disease, has been fairly stable.  She continues on Sandostatin every 2 weeks..  We will still continue to monitor her chromogranin A. levels, which have remained low.  #2.  Followup.  We will follow back up  with Leah [name], in 2-3 months, but before then should there be questions or concerns.

## 2012-03-30 NOTE — Patient Instructions (Signed)

## 2012-04-02 ENCOUNTER — Ambulatory Visit: Payer: BC Managed Care – PPO | Admitting: Medical

## 2012-04-02 ENCOUNTER — Other Ambulatory Visit: Payer: BC Managed Care – PPO | Admitting: Lab

## 2012-04-02 ENCOUNTER — Ambulatory Visit: Payer: BC Managed Care – PPO

## 2012-04-03 LAB — COMPREHENSIVE METABOLIC PANEL
AST: 20 U/L (ref 0–37)
Alkaline Phosphatase: 80 U/L (ref 39–117)
BUN: 17 mg/dL (ref 6–23)
Glucose, Bld: 125 mg/dL — ABNORMAL HIGH (ref 70–99)
Sodium: 139 mEq/L (ref 135–145)
Total Bilirubin: 0.6 mg/dL (ref 0.3–1.2)

## 2012-04-09 ENCOUNTER — Other Ambulatory Visit: Payer: BC Managed Care – PPO | Admitting: Lab

## 2012-04-09 ENCOUNTER — Ambulatory Visit: Payer: BC Managed Care – PPO | Admitting: Medical

## 2012-04-09 ENCOUNTER — Ambulatory Visit: Payer: BC Managed Care – PPO

## 2012-04-13 ENCOUNTER — Ambulatory Visit (HOSPITAL_BASED_OUTPATIENT_CLINIC_OR_DEPARTMENT_OTHER): Payer: BC Managed Care – PPO

## 2012-04-13 VITALS — BP 156/105 | HR 100 | Temp 98.1°F | Resp 18

## 2012-04-13 DIAGNOSIS — C7B8 Other secondary neuroendocrine tumors: Secondary | ICD-10-CM

## 2012-04-13 DIAGNOSIS — C7A00 Malignant carcinoid tumor of unspecified site: Secondary | ICD-10-CM

## 2012-04-13 DIAGNOSIS — C787 Secondary malignant neoplasm of liver and intrahepatic bile duct: Secondary | ICD-10-CM

## 2012-04-13 MED ORDER — OCTREOTIDE ACETATE 30 MG IM KIT
30.0000 mg | PACK | Freq: Once | INTRAMUSCULAR | Status: AC
  Administered 2012-04-13: 30 mg via INTRAMUSCULAR

## 2012-04-13 NOTE — Patient Instructions (Signed)

## 2012-04-27 ENCOUNTER — Ambulatory Visit (HOSPITAL_BASED_OUTPATIENT_CLINIC_OR_DEPARTMENT_OTHER): Payer: BC Managed Care – PPO

## 2012-04-27 VITALS — BP 149/93 | HR 102 | Temp 97.6°F

## 2012-04-27 DIAGNOSIS — C787 Secondary malignant neoplasm of liver and intrahepatic bile duct: Secondary | ICD-10-CM

## 2012-04-27 DIAGNOSIS — C7A00 Malignant carcinoid tumor of unspecified site: Secondary | ICD-10-CM

## 2012-04-27 DIAGNOSIS — C7B8 Other secondary neuroendocrine tumors: Secondary | ICD-10-CM

## 2012-04-27 MED ORDER — OCTREOTIDE ACETATE 30 MG IM KIT
30.0000 mg | PACK | Freq: Once | INTRAMUSCULAR | Status: AC
  Administered 2012-04-27: 30 mg via INTRAMUSCULAR

## 2012-04-27 NOTE — Patient Instructions (Signed)

## 2012-04-30 ENCOUNTER — Other Ambulatory Visit: Payer: Self-pay | Admitting: Orthopedic Surgery

## 2012-04-30 DIAGNOSIS — M25562 Pain in left knee: Secondary | ICD-10-CM

## 2012-05-03 ENCOUNTER — Other Ambulatory Visit: Payer: Self-pay | Admitting: Internal Medicine

## 2012-05-03 ENCOUNTER — Other Ambulatory Visit: Payer: Self-pay

## 2012-05-03 MED ORDER — VALSARTAN 160 MG PO TABS: 160.0000 mg | ORAL_TABLET | Freq: Every day | ORAL | Status: DC

## 2012-05-03 MED ORDER — PANTOPRAZOLE SODIUM 40 MG PO TBEC: 40.0000 mg | DELAYED_RELEASE_TABLET | Freq: Every day | ORAL | Status: DC

## 2012-05-04 ENCOUNTER — Ambulatory Visit
Admission: RE | Admit: 2012-05-04 | Discharge: 2012-05-04 | Disposition: A | Discharge status: Home / Self Care | Payer: BC Managed Care – PPO | Source: Ambulatory Visit | Attending: Orthopedic Surgery | Admitting: Orthopedic Surgery

## 2012-05-04 DIAGNOSIS — M25562 Pain in left knee: Secondary | ICD-10-CM

## 2012-05-11 ENCOUNTER — Ambulatory Visit (HOSPITAL_BASED_OUTPATIENT_CLINIC_OR_DEPARTMENT_OTHER): Payer: BC Managed Care – PPO

## 2012-05-11 VITALS — BP 131/93 | HR 103 | Temp 96.8°F | Resp 18

## 2012-05-11 DIAGNOSIS — C787 Secondary malignant neoplasm of liver and intrahepatic bile duct: Secondary | ICD-10-CM

## 2012-05-11 DIAGNOSIS — C7A00 Malignant carcinoid tumor of unspecified site: Secondary | ICD-10-CM

## 2012-05-11 DIAGNOSIS — C7B8 Other secondary neuroendocrine tumors: Secondary | ICD-10-CM

## 2012-05-11 MED ORDER — OCTREOTIDE ACETATE 30 MG IM KIT
30.0000 mg | PACK | Freq: Once | INTRAMUSCULAR | Status: AC
  Administered 2012-05-11: 30 mg via INTRAMUSCULAR

## 2012-05-11 NOTE — Patient Instructions (Signed)

## 2012-05-25 ENCOUNTER — Ambulatory Visit (HOSPITAL_BASED_OUTPATIENT_CLINIC_OR_DEPARTMENT_OTHER): Payer: BC Managed Care – PPO

## 2012-05-25 DIAGNOSIS — C7B8 Other secondary neuroendocrine tumors: Secondary | ICD-10-CM

## 2012-05-25 DIAGNOSIS — C787 Secondary malignant neoplasm of liver and intrahepatic bile duct: Secondary | ICD-10-CM

## 2012-05-25 DIAGNOSIS — C7A00 Malignant carcinoid tumor of unspecified site: Secondary | ICD-10-CM

## 2012-05-25 MED ORDER — OCTREOTIDE ACETATE 30 MG IM KIT
30.0000 mg | PACK | Freq: Once | INTRAMUSCULAR | Status: AC
  Administered 2012-05-25: 30 mg via INTRAMUSCULAR

## 2012-06-01 ENCOUNTER — Ambulatory Visit: Payer: BC Managed Care – PPO

## 2012-06-01 ENCOUNTER — Other Ambulatory Visit: Payer: BC Managed Care – PPO | Admitting: Lab

## 2012-06-01 ENCOUNTER — Ambulatory Visit: Payer: BC Managed Care – PPO | Admitting: Hematology & Oncology

## 2012-06-08 ENCOUNTER — Ambulatory Visit (HOSPITAL_BASED_OUTPATIENT_CLINIC_OR_DEPARTMENT_OTHER): Payer: BC Managed Care – PPO

## 2012-06-08 ENCOUNTER — Other Ambulatory Visit (HOSPITAL_BASED_OUTPATIENT_CLINIC_OR_DEPARTMENT_OTHER): Payer: BC Managed Care – PPO | Admitting: Lab

## 2012-06-08 ENCOUNTER — Ambulatory Visit (HOSPITAL_BASED_OUTPATIENT_CLINIC_OR_DEPARTMENT_OTHER): Payer: BC Managed Care – PPO | Admitting: Medical

## 2012-06-08 VITALS — BP 132/91 | HR 98 | Temp 98.0°F | Resp 16 | Ht 59.0 in | Wt 210.0 lb

## 2012-06-08 DIAGNOSIS — C7A00 Malignant carcinoid tumor of unspecified site: Secondary | ICD-10-CM

## 2012-06-08 DIAGNOSIS — C787 Secondary malignant neoplasm of liver and intrahepatic bile duct: Secondary | ICD-10-CM

## 2012-06-08 DIAGNOSIS — C7B8 Other secondary neuroendocrine tumors: Secondary | ICD-10-CM

## 2012-06-08 LAB — CBC WITH DIFFERENTIAL (CANCER CENTER ONLY)
BASO%: 0.5 % (ref 0.0–2.0)
EOS%: 1.8 % (ref 0.0–7.0)
HCT: 38.9 % (ref 34.8–46.6)
LYMPH#: 2.7 10*3/uL (ref 0.9–3.3)
MCHC: 32.9 g/dL (ref 32.0–36.0)
NEUT#: 5.4 10*3/uL (ref 1.5–6.5)
NEUT%: 60.7 % (ref 39.6–80.0)
Platelets: 236 10*3/uL (ref 145–400)
RDW: 13.5 % (ref 11.1–15.7)

## 2012-06-08 MED ORDER — OCTREOTIDE ACETATE 30 MG IM KIT
30.0000 mg | PACK | Freq: Once | INTRAMUSCULAR | Status: AC
  Administered 2012-06-08: 30 mg via INTRAMUSCULAR

## 2012-06-08 NOTE — Progress Notes (Signed)
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  Current therapy: Sandostatin LAR 30 mg IM q. 2 weeks.  Interim history: Leah [name] comes in today for an office followup visit.  Her husband accompanies her.  Overall, she, reports she's doing relatively well.  [facility]  She continues with water aerobics.  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  She continues on Sandostatin every 2 weeks.  She does have some minor flushing and diarrhea for about a day.  She otherwise does quite well.  She still continues to followup with Dr. Flonnie Hailstone at [facility] every 6 months.  She, reports, that all her scans have been stable.  Unfortunately, we do not have a copy of those.  Her last chromogranin A. level was 3.0.  She reports, she has a good appetite.  She denies any nausea, vomiting, constipation, chest pain, shortness of breath, or cough.  She denies any fevers, chills, or night sweats.  She denies any abdominal pain.  She denies any lower leg swelling.  She denies any headaches, visual changes, or rashes.  Review of Systems: Constitutional:Negative for malaise/fatigue, fever, chills, weight loss, diaphoresis, activity change, appetite change, and unexpected weight change.  HEENT: Negative for double vision, blurred vision, visual loss, ear pain, tinnitus, congestion, rhinorrhea, epistaxis sore throat or sinus disease, oral pain/lesion, tongue soreness Respiratory: Negative for cough, chest tightness, shortness of breath, wheezing and stridor.  Cardiovascular: Negative for chest pain, palpitations, leg swelling, orthopnea, PND, DOE or claudication Gastrointestinal: Negative for nausea, vomiting, abdominal pain, diarrhea, constipation, blood in stool, melena, hematochezia, abdominal distention, anal bleeding, rectal pain, anorexia and hematemesis.  Genitourinary: Negative for dysuria, frequency, hematuria,  Musculoskeletal: Negative for myalgias,  back pain, joint swelling, arthralgias and gait problem.  Skin: Negative for rash, color change, pallor and wound.  Neurological:. Negative for dizziness/light-headedness, tremors, seizures, syncope, facial asymmetry, speech difficulty, weakness, numbness, headaches and paresthesias.  Hematological: Negative for adenopathy. Does not bruise/bleed easily.  Psychiatric/Behavioral:  Negative for depression, no loss of interest in normal activity or change in sleep pattern.   Physical Exam: This is a somewhat obese, 52 year old, female, in no obvious distress Vitals: Temperature 90.0 degrees, pulse 90, respirations 16, blood pressure 132/91, weight 210 pounds HEENT reveals a normocephalic, atraumatic skull, no scleral icterus, no oral lesions  Neck is supple without any cervical or supraclavicular adenopathy.  Lungs are clear to auscultation bilaterally. There are no wheezes, rales or rhonci Cardiac is regular rate and rhythm with a normal S1 and S2. There are no murmurs, rubs, or bruits.  Abdomen is soft with good bowel sounds, there is no palpable mass. There is no palpable hepatosplenomegaly. There is no palpable fluid wave.  Musculoskeletal no tenderness of the spine, ribs, or hips.  Extremities there are no clubbing, cyanosis, or edema.  Skin no petechia, purpura or ecchymosis Neurologic is nonfocal.  Laboratory Data: White count 8.8, hemoglobin 12.8, hematocrit 30.9, platelets 236,000  Current Outpatient Prescriptions on File Prior to Visit  Medication Sig Dispense Refill  . albuterol (PROVENTIL HFA;VENTOLIN HFA) 108 (90 BASE) MCG/ACT inhaler Inhale 2 puffs into the lungs every 6 (six) hours as needed. For shortness of breath.      Marland Kitchen amLODipine (NORVASC) 5 MG tablet Take 1 tablet (5 mg total) by mouth daily.  90 tablet  4  . [facility] ALLERGY RELIEF 180 MG tablet TAKE 1 TABLET BY MOUTH EVERY DAY  30 tablet  11  . Diclofenac  Sodium (PENNSAID) 1.5 % SOLN Place 40 drops onto the skin 4 (four) times  daily as needed. For joint pain.      Marland Kitchen DIOVAN 160 MG tablet TAKE 1 TABLET DAILY  90 tablet  3  . famotidine (PEPCID) 20 MG tablet Take 1 tablet (20 mg total) by mouth 2 (two) times daily.  30 tablet  0  . KRISTALOSE 20 G packet Take 20 g by mouth daily as needed. For high ammonia.      . methocarbamol (ROBAXIN) 500 MG tablet Take 500 mg by mouth 3 (three) times daily.       . metroNIDAZOLE (METROCREAM) 0.75 % cream Apply 1 application topically 2 (two) times daily as needed. For yeast.      . montelukast (SINGULAIR) 10 MG tablet TAKE 1 TABLET AT BEDTIME  90 tablet  3  . naproxen sodium (ANAPROX) 220 MG tablet Take 440 mg by mouth 2 (two) times daily as needed. For pain.      Marland Kitchen octreotide (SANDOSTATIN LAR) 30 MG injection Inject 30 mg into the muscle every 14 (fourteen) days.      . ondansetron (ZOFRAN) 4 MG tablet Take 4 mg by mouth every 8 (eight) hours as needed. For nausea.      . pantoprazole (PROTONIX) 40 MG tablet TAKE 1 TABLET TWO TIMES A DAY  180 tablet  1  . Sulfacetamide Sodium-Sulfur 10-5 % EMUL as needed.       . traZODone (DESYREL) 100 MG tablet TAKE 1 TABLET AT BEDTIME  90 tablet  1  . triamcinolone cream (KENALOG) 0.1 % APPLY ON RASH THREE TIMES A DAY  30 g  1  . VAGIFEM 10 MCG TABS Place 10 mcg vaginally daily.       . valACYclovir (VALTREX) 500 MG tablet TAKE 1 TABLET TWO TIMES A DAY  180 tablet  1  . venlafaxine XR (EFFEXOR-XR) 150 MG 24 hr capsule TAKE 1 CAPSULE DAILY  90 capsule  3   No current facility-administered medications on file prior to visit.   Assessment/Plan: This is a pleasant, 52 year old, white female, with the following issues:  #1.  Metastatic neuroendocrine carcinoma.   She did undergo a partial resection at [facility].  She still has disease, but as far as we can tell, her disease, has been fairly stable.  She continues on Sandostatin every 2 weeks..  We will still continue to monitor her chromogranin A. levels, which have remained low.  #2.  Followup.  We  will follow back up with Leah [name], in 2-3 months, but before then should there be questions or concerns.

## 2012-06-09 DIAGNOSIS — M1710 Unilateral primary osteoarthritis, unspecified knee: Secondary | ICD-10-CM | POA: Insufficient documentation

## 2012-06-16 LAB — COMPREHENSIVE METABOLIC PANEL
ALT: 29 U/L (ref 0–35)
AST: 21 U/L (ref 0–37)
Calcium: 8.8 mg/dL (ref 8.4–10.5)
Chloride: 103 mEq/L (ref 96–112)
Creatinine, Ser: 0.71 mg/dL (ref 0.50–1.10)
Total Bilirubin: 0.7 mg/dL (ref 0.3–1.2)

## 2012-06-22 ENCOUNTER — Ambulatory Visit (HOSPITAL_BASED_OUTPATIENT_CLINIC_OR_DEPARTMENT_OTHER): Payer: BC Managed Care – PPO

## 2012-06-22 VITALS — BP 137/98 | HR 100 | Resp 20

## 2012-06-22 DIAGNOSIS — C7B8 Other secondary neuroendocrine tumors: Secondary | ICD-10-CM

## 2012-06-22 DIAGNOSIS — C787 Secondary malignant neoplasm of liver and intrahepatic bile duct: Secondary | ICD-10-CM

## 2012-06-22 DIAGNOSIS — C7A00 Malignant carcinoid tumor of unspecified site: Secondary | ICD-10-CM

## 2012-06-22 MED ORDER — OCTREOTIDE ACETATE 30 MG IM KIT
30.0000 mg | PACK | Freq: Once | INTRAMUSCULAR | Status: AC
  Administered 2012-06-22: 30 mg via INTRAMUSCULAR

## 2012-06-22 NOTE — Patient Instructions (Signed)

## 2012-07-06 ENCOUNTER — Ambulatory Visit (HOSPITAL_BASED_OUTPATIENT_CLINIC_OR_DEPARTMENT_OTHER): Payer: BC Managed Care – PPO

## 2012-07-06 VITALS — BP 149/95 | HR 103 | Temp 97.6°F | Resp 20

## 2012-07-06 DIAGNOSIS — C787 Secondary malignant neoplasm of liver and intrahepatic bile duct: Secondary | ICD-10-CM

## 2012-07-06 DIAGNOSIS — C7B8 Other secondary neuroendocrine tumors: Secondary | ICD-10-CM

## 2012-07-06 DIAGNOSIS — C7A00 Malignant carcinoid tumor of unspecified site: Secondary | ICD-10-CM

## 2012-07-06 MED ORDER — OCTREOTIDE ACETATE 30 MG IM KIT
30.0000 mg | PACK | Freq: Once | INTRAMUSCULAR | Status: AC
  Administered 2012-07-06: 30 mg via INTRAMUSCULAR

## 2012-07-06 NOTE — Patient Instructions (Signed)

## 2012-07-06 NOTE — Addendum Note (Signed)
Addended by: Rosario Adie A on: 07/06/2012 11:10 AM   Modules accepted: Orders

## 2012-07-12 ENCOUNTER — Encounter: Payer: Self-pay | Admitting: *Deleted

## 2012-07-15 ENCOUNTER — Telehealth: Payer: Self-pay | Admitting: Nurse Practitioner

## 2012-07-15 ENCOUNTER — Ambulatory Visit (INDEPENDENT_AMBULATORY_CARE_PROVIDER_SITE_OTHER): Payer: BC Managed Care – PPO | Admitting: Nurse Practitioner

## 2012-07-15 ENCOUNTER — Encounter: Payer: Self-pay | Admitting: Nurse Practitioner

## 2012-07-15 VITALS — BP 128/72 | HR 76 | Resp 14 | Ht 59.5 in | Wt 208.2 lb

## 2012-07-15 DIAGNOSIS — Z0000 Encounter for general adult medical examination without abnormal findings: Secondary | ICD-10-CM

## 2012-07-15 DIAGNOSIS — Z01419 Encounter for gynecological examination (general) (routine) without abnormal findings: Secondary | ICD-10-CM

## 2012-07-15 LAB — POCT URINALYSIS DIPSTICK: Leukocytes, UA: NEGATIVE

## 2012-07-15 MED ORDER — ESTRADIOL 10 MCG VA TABS: 10.0000 ug | ORAL_TABLET | Freq: Every day | VAGINAL | Status: DC

## 2012-07-15 MED ORDER — VALACYCLOVIR HCL 500 MG PO TABS: ORAL_TABLET | ORAL | Status: DC

## 2012-07-15 NOTE — Telephone Encounter (Signed)
Patient called to check the status of having a medication approved by a doctor. She said it started with an O but couldn't remember the name of it. Please advise.

## 2012-07-15 NOTE — Patient Instructions (Signed)

## 2012-07-15 NOTE — Telephone Encounter (Signed)
Leah G. To talk with Dr. Hyacinth Meeker and get back with patient. Pt ok with this. cm

## 2012-07-15 NOTE — Progress Notes (Signed)
52 y.o. G30P2002 Married Caucasian Fe here for annual exam.  No new health concerns. Left knee is much worse.  No LMP recorded. Patient has had a hysterectomy.          Sexually active: yes  The current method of family planning is status post hysterectomy.    Exercising: yes  aqua aerobics Smoker:  no  Health Maintenance: Pap:  12/03/2010 MMG:  11/2010 Colonoscopy:  2012 BMD:  Pt unsure.   TDaP:  2011 Labs: PCP does lab (blood) work.    reports that she has never smoked. She has never used smokeless tobacco. She reports that she does not drink alcohol or use illicit drugs.  Past Medical History  Diagnosis Date  . Sarcoidosis   . Allergic rhinitis   . GE reflux   . Neuroendocrine tumor   . HTN (hypertension)   . Surgical menopause   . Migraines   . Atrophic vaginitis   . STD (sexually transmitted disease)     HSV I  . Hepatic encephalopathy 2011    Past Surgical History  Procedure Laterality Date  . Tonsillectomy  1976  . Cesarean section  [id number]  . Cholecystectomy  2003  . Abdominal hysterectomy  2004  . Oophorectomy  2004  . Mastectomy  2008    bilateral  . Bilateral mastectectomy    . Latissimus dorsi graft      used for reconstructive breast surgery  . Hepatetetomy    . Tubal ligation  2001    BTL  . Breast surgery      bilateral masectomy and reconstruction with silicone implants    Current Outpatient Prescriptions  Medication Sig Dispense Refill  . albuterol (PROVENTIL HFA;VENTOLIN HFA) 108 (90 BASE) MCG/ACT inhaler Inhale 2 puffs into the lungs every 6 (six) hours as needed. For shortness of breath.      Marland Kitchen amLODipine (NORVASC) 5 MG tablet TAKE 1 TABLET BY MOUTH DAILY  90 tablet  3  . Diclofenac Sodium (PENNSAID) 1.5 % SOLN Place 40 drops onto the skin 4 (four) times daily as needed. For joint pain.      Marland Kitchen KRISTALOSE 20 G packet Take 20 g by mouth daily as needed. For high ammonia.      . methocarbamol (ROBAXIN) 500 MG tablet Take 500 mg by mouth 3  (three) times daily.       . metroNIDAZOLE (METROCREAM) 0.75 % cream Apply 1 application topically 2 (two) times daily as needed. For yeast.      . montelukast (SINGULAIR) 10 MG tablet TAKE 1 TABLET AT BEDTIME  90 tablet  3  . naproxen sodium (ANAPROX) 220 MG tablet Take 440 mg by mouth 2 (two) times daily as needed. For pain.      Marland Kitchen octreotide (SANDOSTATIN LAR) 30 MG injection Inject 30 mg into the muscle every 14 (fourteen) days.      . ondansetron (ZOFRAN) 4 MG tablet Take 4 mg by mouth every 8 (eight) hours as needed. For nausea.      . pantoprazole (PROTONIX) 40 MG tablet Take 40 mg by mouth 2 (two) times daily.      . traZODone (DESYREL) 100 MG tablet TAKE 1 TABLET AT BEDTIME  90 tablet  1  . triamcinolone cream (KENALOG) 0.1 % APPLY ON RASH THREE TIMES A DAY  30 g  1  . VAGIFEM 10 MCG TABS Place 10 mcg vaginally daily.       . valACYclovir (VALTREX) 500 MG tablet TAKE 1 TABLET  TWO TIMES A DAY  180 tablet  1  . valsartan (DIOVAN) 160 MG tablet Take 1 tablet (160 mg total) by mouth daily.  90 tablet  3  . venlafaxine XR (EFFEXOR-XR) 150 MG 24 hr capsule TAKE ONE CAPSULE BY MOUTH EVERY DAY  30 capsule  2   No current facility-administered medications for this visit.    Family History  Problem Relation Age of Onset  . Arthritis Maternal Grandmother   . Arthritis Maternal Grandfather   . Arthritis Paternal Grandmother   . Arthritis Paternal Grandfather   . Breast cancer Mother   . Heart failure Father   . Breast cancer Maternal Aunt   . Cancer Maternal Aunt     ovarian cancer    ROS:  Pertinent items are noted in HPI.  Otherwise, a comprehensive ROS was negative.  Exam:   BP 128/72  Pulse 76  Resp 14  Ht 4' 11.5" (1.511 m)  Wt 208 lb 3.2 oz (94.439 kg)  BMI 41.36 kg/m2 Height: 4' 11.5" (151.1 cm)  Ht Readings from Last 3 Encounters:  07/15/12 4' 11.5" (1.511 m)  06/08/12 4\' 11"  (1.499 m)  03/30/12 4\' 11"  (1.499 m)    General appearance: alert, cooperative and appears  stated age Head: Normocephalic, without obvious abnormality, atraumatic Neck: no adenopathy, supple, symmetrical, trachea midline and thyroid normal to inspection and palpation Lungs: clear to auscultation bilaterally Breasts: normal appearance, no masses or tenderness, positive findings: post surgical changes bilaterally Heart: regular rate and rhythm Abdomen: soft, non-tender; no masses,  no organomegaly Extremities: extremities normal, atraumatic, no cyanosis or edema Skin: Skin color, texture, turgor normal. No rashes or lesions Lymph nodes: Cervical, supraclavicular, and axillary nodes normal. No abnormal inguinal nodes palpated Neurologic: Grossly normal   Pelvic: External genitalia:  no lesions              Urethra:  normal appearing urethra with no masses, tenderness or lesions              Bartholin's and Skene's: normal                 Vagina: normal appearing vagina with normal color and discharge, no lesions              Cervix: absent              Pap taken: no Bimanual Exam:  Uterus:  uterus absent              Adnexa: no mass, fullness, tenderness               Rectovaginal: Confirms               Anus:  normal sphincter tone, no lesions  A:  Well Woman with normal exam  S/P hysterectomy 2004  S/P breast mastectomy and reconstruction 2008  Atrophic vaginitis on Vagifem    P:   Refill Vagifem  Mammogram due now  counseled on adequate intake of calcium and vitamin D, diet and exercise  return annually or prn  An After Visit Summary was printed and given to the patient.

## 2012-07-16 ENCOUNTER — Telehealth: Payer: Self-pay | Admitting: Nurse Practitioner

## 2012-07-16 NOTE — Telephone Encounter (Signed)
Per Dr. Hyacinth Meeker needs to have current LFT's that are normal before starting Osphena.  Then if all normal can start meds and do repeat LFT's in 3 months with a recheck visit to see how things are going.  Patient is agreeable and is going next week to have repeat labs at PCP.  She will have them forward results and then follow up for Rx.    Because of the complexity of her health issues, Dr. Hyacinth Meeker also feels that she or MD needs to see this patient.

## 2012-07-19 ENCOUNTER — Telehealth: Payer: Self-pay | Admitting: *Deleted

## 2012-07-19 MED ORDER — ESTRADIOL 10 MCG VA TABS: 10.0000 ug | ORAL_TABLET | VAGINAL | Status: DC

## 2012-07-19 NOTE — Progress Notes (Signed)
Reviewed personally.  M. Suzanne Daizee Firmin, MD.  

## 2012-07-19 NOTE — Telephone Encounter (Signed)
Since chart was still opened waiting on another issues to be cleared about her Osphena, I was able to go back in and reorder her Vagifem.  The day she was here I also tried to change the order and it just didn't get saved or go in correctly.  It has now been [facility] Thanks

## 2012-07-19 NOTE — Telephone Encounter (Signed)
[facility] CALLED TO CLARIFY DOSAGE FOR MEDICATION  VAGIFEM 10 MCG TAB TO PLACE VAGINALLY DAILY. [facility] STATES SHOULD BE 2 X WEEK. PLEASE ADVISE. SUE

## 2012-07-20 ENCOUNTER — Ambulatory Visit (HOSPITAL_BASED_OUTPATIENT_CLINIC_OR_DEPARTMENT_OTHER): Payer: BC Managed Care – PPO

## 2012-07-20 DIAGNOSIS — C7A00 Malignant carcinoid tumor of unspecified site: Secondary | ICD-10-CM

## 2012-07-20 DIAGNOSIS — R232 Flushing: Secondary | ICD-10-CM

## 2012-07-20 DIAGNOSIS — R197 Diarrhea, unspecified: Secondary | ICD-10-CM

## 2012-07-20 DIAGNOSIS — C7B8 Other secondary neuroendocrine tumors: Secondary | ICD-10-CM

## 2012-07-20 DIAGNOSIS — C787 Secondary malignant neoplasm of liver and intrahepatic bile duct: Secondary | ICD-10-CM

## 2012-07-20 MED ORDER — OCTREOTIDE ACETATE 30 MG IM KIT
30.0000 mg | PACK | Freq: Once | INTRAMUSCULAR | Status: AC
  Administered 2012-07-20: 30 mg via INTRAMUSCULAR

## 2012-07-22 ENCOUNTER — Other Ambulatory Visit: Payer: Self-pay | Admitting: Internal Medicine

## 2012-07-22 ENCOUNTER — Other Ambulatory Visit: Payer: BC Managed Care – PPO | Admitting: Internal Medicine

## 2012-07-22 DIAGNOSIS — Z0000 Encounter for general adult medical examination without abnormal findings: Secondary | ICD-10-CM

## 2012-07-22 LAB — CBC WITH DIFFERENTIAL/PLATELET
Basophils Absolute: 0 10*3/uL (ref 0.0–0.1)
Basophils Relative: 0 % (ref 0–1)
Lymphocytes Relative: 23 % (ref 12–46)
MCHC: 34 g/dL (ref 30.0–36.0)
Neutro Abs: 7.5 10*3/uL (ref 1.7–7.7)
Platelets: 286 10*3/uL (ref 150–400)
RDW: 14.2 % (ref 11.5–15.5)
WBC: 11.1 10*3/uL — ABNORMAL HIGH (ref 4.0–10.5)

## 2012-07-22 LAB — COMPREHENSIVE METABOLIC PANEL
ALT: 33 U/L (ref 0–35)
AST: 28 U/L (ref 0–37)
Albumin: 4.2 g/dL (ref 3.5–5.2)
Alkaline Phosphatase: 99 U/L (ref 39–117)
BUN: 16 mg/dL (ref 6–23)
Calcium: 9.2 mg/dL (ref 8.4–10.5)
Chloride: 104 mEq/L (ref 96–112)
Creat: 0.78 mg/dL (ref 0.50–1.10)
Potassium: 4.1 mEq/L (ref 3.5–5.3)

## 2012-07-22 LAB — LIPID PANEL
HDL: 56 mg/dL (ref >39)
LDL Cholesterol: 101 mg/dL — ABNORMAL HIGH (ref 0–99)
Total CHOL/HDL Ratio: 3.1 Ratio

## 2012-07-22 LAB — TSH: TSH: 1.863 u[IU]/mL (ref 0.350–4.500)

## 2012-07-23 ENCOUNTER — Ambulatory Visit (INDEPENDENT_AMBULATORY_CARE_PROVIDER_SITE_OTHER): Payer: BC Managed Care – PPO | Admitting: Internal Medicine

## 2012-07-23 ENCOUNTER — Encounter: Payer: Self-pay | Admitting: Internal Medicine

## 2012-07-23 ENCOUNTER — Telehealth: Payer: Self-pay | Admitting: Nurse Practitioner

## 2012-07-23 VITALS — HR 110 | Temp 98.7°F | Wt 209.0 lb

## 2012-07-23 DIAGNOSIS — E669 Obesity, unspecified: Secondary | ICD-10-CM

## 2012-07-23 DIAGNOSIS — M1712 Unilateral primary osteoarthritis, left knee: Secondary | ICD-10-CM

## 2012-07-23 DIAGNOSIS — D136 Benign neoplasm of pancreas: Secondary | ICD-10-CM

## 2012-07-23 DIAGNOSIS — M1710 Unilateral primary osteoarthritis, unspecified knee: Secondary | ICD-10-CM

## 2012-07-23 DIAGNOSIS — Z862 Personal history of diseases of the blood and blood-forming organs and certain disorders involving the immune mechanism: Secondary | ICD-10-CM

## 2012-07-23 DIAGNOSIS — D3A098 Benign carcinoid tumors of other sites: Secondary | ICD-10-CM

## 2012-07-23 DIAGNOSIS — IMO0002 Reserved for concepts with insufficient information to code with codable children: Secondary | ICD-10-CM

## 2012-07-23 DIAGNOSIS — F411 Generalized anxiety disorder: Secondary | ICD-10-CM

## 2012-07-23 DIAGNOSIS — I10 Essential (primary) hypertension: Secondary | ICD-10-CM

## 2012-07-23 DIAGNOSIS — Z0000 Encounter for general adult medical examination without abnormal findings: Secondary | ICD-10-CM

## 2012-07-23 DIAGNOSIS — K219 Gastro-esophageal reflux disease without esophagitis: Secondary | ICD-10-CM

## 2012-07-23 LAB — VITAMIN D 25 HYDROXY (VIT D DEFICIENCY, FRACTURES): Vit D, 25-Hydroxy: 50 ng/mL (ref 30–89)

## 2012-07-23 MED ORDER — OSPEMIFENE 60 MG PO TABS: 1.0000 | ORAL_TABLET | Freq: Every day | ORAL | Status: DC

## 2012-07-23 NOTE — Telephone Encounter (Signed)
Pt says she is to take a test to start on some medication but not sure of the name. Please call to advise.

## 2012-07-23 NOTE — Telephone Encounter (Signed)
Rx to pharmacy.  She needs to F/u with PG or me in 4-6 weeks for recheck

## 2012-07-23 NOTE — Telephone Encounter (Signed)
Patient calling to make sure all testing has been done prior to starting medication. See EPIC for results of LFT's . If all ok please send Rx for Osphena  [facility] [address] [facility] request. sue

## 2012-07-23 NOTE — Telephone Encounter (Signed)
Patient notified of Rx for [facility] Hyacinth Meeker. Aware of needing appt. With Shirlyn Goltz or Dr. Hyacinth Meeker in [place]. Appointment made for July 16th at 11:15 with Shirlyn Goltz, FNP. Leah [name]

## 2012-07-26 NOTE — Progress Notes (Signed)
Patient informed. 

## 2012-07-27 ENCOUNTER — Other Ambulatory Visit: Payer: BC Managed Care – PPO | Admitting: Internal Medicine

## 2012-07-27 ENCOUNTER — Telehealth: Payer: Self-pay | Admitting: Nurse Practitioner

## 2012-07-27 MED ORDER — OSPEMIFENE 60 MG PO TABS: 1.0000 | ORAL_TABLET | Freq: Every day | ORAL | Status: DC

## 2012-07-27 NOTE — Telephone Encounter (Signed)
[facility] [address] [facility]

## 2012-07-27 NOTE — Telephone Encounter (Signed)
Patient called in to see if we could call in her Rx for Aspina

## 2012-07-27 NOTE — Telephone Encounter (Signed)
A 30 day supply was sent to [facility] on 07/23/12

## 2012-07-28 NOTE — Telephone Encounter (Signed)
PC from pt stating [facility] and [facility] do not [facility].  [facility] and [facility].  They did receive the RX yesterday, but it was put on hold.  I asked the pharmacist if he could fill this for the patient now.  He states he was not sure and they may not have the medication to fill for the patient.   I called the patient back and advised her that her medication was received by the pharmacy on 6/17 and I have requested they fill this for her now.  She will call and follow up with the pharmacy.

## 2012-08-05 ENCOUNTER — Other Ambulatory Visit (HOSPITAL_BASED_OUTPATIENT_CLINIC_OR_DEPARTMENT_OTHER): Payer: BC Managed Care – PPO

## 2012-08-05 ENCOUNTER — Ambulatory Visit (HOSPITAL_BASED_OUTPATIENT_CLINIC_OR_DEPARTMENT_OTHER): Payer: BC Managed Care – PPO

## 2012-08-05 ENCOUNTER — Other Ambulatory Visit: Payer: BC Managed Care – PPO | Admitting: Lab

## 2012-08-05 ENCOUNTER — Ambulatory Visit (HOSPITAL_BASED_OUTPATIENT_CLINIC_OR_DEPARTMENT_OTHER): Payer: BC Managed Care – PPO | Admitting: Medical

## 2012-08-05 VITALS — BP 133/92 | HR 108 | Temp 99.2°F | Resp 20

## 2012-08-05 DIAGNOSIS — C787 Secondary malignant neoplasm of liver and intrahepatic bile duct: Secondary | ICD-10-CM

## 2012-08-05 DIAGNOSIS — C7B8 Other secondary neuroendocrine tumors: Secondary | ICD-10-CM

## 2012-08-05 DIAGNOSIS — C7A00 Malignant carcinoid tumor of unspecified site: Secondary | ICD-10-CM

## 2012-08-05 LAB — CBC WITH DIFFERENTIAL (CANCER CENTER ONLY)
BASO#: 0 10*3/uL (ref 0.0–0.2)
Eosinophils Absolute: 0.1 10*3/uL (ref 0.0–0.5)
HGB: 13 g/dL (ref 11.6–15.9)
MCH: 30.7 pg (ref 26.0–34.0)
MONO#: 0.5 10*3/uL (ref 0.1–0.9)
MONO%: 7.4 % (ref 0.0–13.0)
NEUT#: 4.3 10*3/uL (ref 1.5–6.5)
RBC: 4.23 10*6/uL (ref 3.70–5.32)
WBC: 7.1 10*3/uL (ref 3.9–10.0)

## 2012-08-05 MED ORDER — OCTREOTIDE ACETATE 30 MG IM KIT
30.0000 mg | PACK | Freq: Once | INTRAMUSCULAR | Status: AC
  Administered 2012-08-05: 30 mg via INTRAMUSCULAR

## 2012-08-05 NOTE — Patient Instructions (Signed)

## 2012-08-05 NOTE — Progress Notes (Signed)
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  Current therapy: Sandostatin LAR 30 mg IM q. 2 weeks.  Interim history: Leah [name] comes in today for an office followup visit.    Overall, she, reports she's doing relatively well.  [facility]  She continues with water aerobics.  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  She continues on Sandostatin every 2 weeks.  She does have some minor flushing and diarrhea for about a day.  She otherwise does quite well.  She still continues to followup with Dr. Flonnie Hailstone at [facility] every 6 months.  She, reports, that all her scans have been stable.  Unfortunately, we do not have a copy of those.  Her last chromogranin A. level was 1.8.  She reports, she has a good appetite.  She denies any nausea, vomiting, constipation, chest pain, shortness of breath, or cough.  She denies any fevers, chills, or night sweats.  She denies any abdominal pain.  She denies any lower leg swelling.  She denies any headaches, visual changes, or rashes.  Review of Systems: Constitutional:Negative for malaise/fatigue, fever, chills, weight loss, diaphoresis, activity change, appetite change, and unexpected weight change.  HEENT: Negative for double vision, blurred vision, visual loss, ear pain, tinnitus, congestion, rhinorrhea, epistaxis sore throat or sinus disease, oral pain/lesion, tongue soreness Respiratory: Negative for cough, chest tightness, shortness of breath, wheezing and stridor.  Cardiovascular: Negative for chest pain, palpitations, leg swelling, orthopnea, PND, DOE or claudication Gastrointestinal: Negative for nausea, vomiting, abdominal pain, diarrhea, constipation, blood in stool, melena, hematochezia, abdominal distention, anal bleeding, rectal pain, anorexia and hematemesis.  Genitourinary: Negative for dysuria, frequency, hematuria,  Musculoskeletal: Negative for myalgias, back pain, joint swelling,  arthralgias and gait problem.  Skin: Negative for rash, color change, pallor and wound.  Neurological:. Negative for dizziness/light-headedness, tremors, seizures, syncope, facial asymmetry, speech difficulty, weakness, numbness, headaches and paresthesias.  Hematological: Negative for adenopathy. Does not bruise/bleed easily.  Psychiatric/Behavioral:  Negative for depression, no loss of interest in normal activity or change in sleep pattern.   Physical Exam: This is a somewhat obese, 52 year old, female, in no obvious distress Vitals: Temperature 99.2 degrees pulse 108 respirations 20 blood pressure 133/92 weight 209 pounds HEENT reveals a normocephalic, atraumatic skull, no scleral icterus, no oral lesions  Neck is supple without any cervical or supraclavicular adenopathy.  Lungs are clear to auscultation bilaterally. There are no wheezes, rales or rhonci Cardiac is regular rate and rhythm with a normal S1 and S2. There are no murmurs, rubs, or bruits.  Abdomen is soft with good bowel sounds, there is no palpable mass. There is no palpable hepatosplenomegaly. There is no palpable fluid wave.  Musculoskeletal no tenderness of the spine, ribs, or hips.  Extremities there are no clubbing, cyanosis, or edema.  Skin no petechia, purpura or ecchymosis Neurologic is nonfocal.  Laboratory Data: White count 1 hemoglobin 13.0 hematocrit 39.5 platelets 239,000  Current Outpatient Prescriptions on File Prior to Visit  Medication Sig Dispense Refill  . albuterol (PROVENTIL HFA;VENTOLIN HFA) 108 (90 BASE) MCG/ACT inhaler Inhale 2 puffs into the lungs every 6 (six) hours as needed. For shortness of breath.      Marland Kitchen amLODipine (NORVASC) 5 MG tablet Take 1 tablet (5 mg total) by mouth daily.  90 tablet  4  . [facility] ALLERGY RELIEF 180 MG tablet TAKE 1 TABLET BY MOUTH EVERY DAY  30 tablet  11  . Diclofenac Sodium (PENNSAID) 1.5 %  SOLN Place 40 drops onto the skin 4 (four) times daily as needed. For joint pain.       Marland Kitchen DIOVAN 160 MG tablet TAKE 1 TABLET DAILY  90 tablet  3  . famotidine (PEPCID) 20 MG tablet Take 1 tablet (20 mg total) by mouth 2 (two) times daily.  30 tablet  0  . KRISTALOSE 20 G packet Take 20 g by mouth daily as needed. For high ammonia.      . methocarbamol (ROBAXIN) 500 MG tablet Take 500 mg by mouth 3 (three) times daily.       . metroNIDAZOLE (METROCREAM) 0.75 % cream Apply 1 application topically 2 (two) times daily as needed. For yeast.      . montelukast (SINGULAIR) 10 MG tablet TAKE 1 TABLET AT BEDTIME  90 tablet  3  . naproxen sodium (ANAPROX) 220 MG tablet Take 440 mg by mouth 2 (two) times daily as needed. For pain.      Marland Kitchen octreotide (SANDOSTATIN LAR) 30 MG injection Inject 30 mg into the muscle every 14 (fourteen) days.      . ondansetron (ZOFRAN) 4 MG tablet Take 4 mg by mouth every 8 (eight) hours as needed. For nausea.      . pantoprazole (PROTONIX) 40 MG tablet TAKE 1 TABLET TWO TIMES A DAY  180 tablet  1  . Sulfacetamide Sodium-Sulfur 10-5 % EMUL as needed.       . traZODone (DESYREL) 100 MG tablet TAKE 1 TABLET AT BEDTIME  90 tablet  1  . triamcinolone cream (KENALOG) 0.1 % APPLY ON RASH THREE TIMES A DAY  30 g  1  . VAGIFEM 10 MCG TABS Place 10 mcg vaginally daily.       . valACYclovir (VALTREX) 500 MG tablet TAKE 1 TABLET TWO TIMES A DAY  180 tablet  1  . venlafaxine XR (EFFEXOR-XR) 150 MG 24 hr capsule TAKE 1 CAPSULE DAILY  90 capsule  3   No current facility-administered medications on file prior to visit.   Assessment/Plan: This is a pleasant, 52 year old, white female, with the following issues:  #1.  Metastatic neuroendocrine carcinoma.   She did undergo a partial resection at [facility].  She still has disease, but as far as we can tell, her disease, has been fairly stable.  She continues on Sandostatin every 2 weeks..  We will still continue to monitor her chromogranin A. levels, which have remained low.  #2.  Followup.  We will follow back up with [name],  in 2-3 months, but before then should there be questions or concerns.

## 2012-08-10 LAB — COMPREHENSIVE METABOLIC PANEL
Albumin: 4.3 g/dL (ref 3.5–5.2)
Alkaline Phosphatase: 88 U/L (ref 39–117)
BUN: 20 mg/dL (ref 6–23)
CO2: 22 mEq/L (ref 19–32)
Calcium: 9.3 mg/dL (ref 8.4–10.5)
Chloride: 105 mEq/L (ref 96–112)
Glucose, Bld: 122 mg/dL — ABNORMAL HIGH (ref 70–99)
Potassium: 4.1 mEq/L (ref 3.5–5.3)
Total Protein: 7.2 g/dL (ref 6.0–8.3)

## 2012-08-10 LAB — CHROMOGRANIN A: Chromogranin A: 4 ng/mL (ref 1.9–15.0)

## 2012-08-10 LAB — LACTATE DEHYDROGENASE: LDH: 195 U/L (ref 94–250)

## 2012-08-17 ENCOUNTER — Ambulatory Visit (HOSPITAL_BASED_OUTPATIENT_CLINIC_OR_DEPARTMENT_OTHER): Payer: BC Managed Care – PPO

## 2012-08-17 VITALS — BP 137/85 | HR 89 | Temp 98.0°F | Resp 20

## 2012-08-17 DIAGNOSIS — C7B8 Other secondary neuroendocrine tumors: Secondary | ICD-10-CM

## 2012-08-17 DIAGNOSIS — C787 Secondary malignant neoplasm of liver and intrahepatic bile duct: Secondary | ICD-10-CM

## 2012-08-17 DIAGNOSIS — C7A00 Malignant carcinoid tumor of unspecified site: Secondary | ICD-10-CM

## 2012-08-17 MED ORDER — OCTREOTIDE ACETATE 30 MG IM KIT
30.0000 mg | PACK | Freq: Once | INTRAMUSCULAR | Status: AC
  Administered 2012-08-17: 30 mg via INTRAMUSCULAR

## 2012-08-17 NOTE — Patient Instructions (Signed)

## 2012-08-25 ENCOUNTER — Ambulatory Visit: Payer: BC Managed Care – PPO | Admitting: Nurse Practitioner

## 2012-08-31 ENCOUNTER — Ambulatory Visit: Payer: BC Managed Care – PPO

## 2012-09-07 ENCOUNTER — Ambulatory Visit (INDEPENDENT_AMBULATORY_CARE_PROVIDER_SITE_OTHER): Payer: BC Managed Care – PPO | Admitting: Nurse Practitioner

## 2012-09-07 ENCOUNTER — Encounter: Payer: Self-pay | Admitting: Nurse Practitioner

## 2012-09-07 VITALS — BP 126/64 | HR 72 | Resp 12 | Ht 59.25 in | Wt 201.0 lb

## 2012-09-07 DIAGNOSIS — N952 Postmenopausal atrophic vaginitis: Secondary | ICD-10-CM

## 2012-09-07 MED ORDER — OSPEMIFENE 60 MG PO TABS: 1.0000 | ORAL_TABLET | Freq: Every day | ORAL | Status: DC

## 2012-09-07 NOTE — Patient Instructions (Signed)
Atrophic Vaginitis Atrophic vaginitis is a problem of low levels of estrogen in women. This problem can happen at any age. It is most common in women who have gone through menopause ("the change").  HOW WILL I KNOW IF I HAVE THIS PROBLEM? You may have:  Trouble with peeing (urinating), such as:  Going to the bathroom often.  A hard time holding your pee until you reach a bathroom.  Leaking pee.  Having pain when you pee.  Itching or a burning feeling.  Vaginal bleeding and spotting.  Pain during sex.  Dryness of the vagina.  A yellow, bad-smelling fluid (discharge) coming from the vagina. HOW WILL MY DOCTOR CHECK FOR THIS PROBLEM?  During your exam, your doctor will likely find the problem.  If there is a vaginal fluid, it may be checked for infection. HOW WILL THIS PROBLEM BE TREATED? Keep the vulvar skin as clean as possible. Moisturizers and lubricants can help with some of the symptoms. Estrogen replacement can help. There are 2 ways to take estrogen:  Systemic estrogen gets estrogen to your whole body. It takes many weeks or months before the symptoms get better.  You take an estrogen pill.  You use a skin patch. This is a patch that you put on your skin.  If you still have your uterus, your doctor may ask you to take a hormone. Talk to your doctor about the right medicine for you.  Estrogen cream.  This puts estrogen only at the part of your body where you apply it. The cream is put into the vagina or put on the vulvar skin. For some women, estrogen cream works faster than pills or the patch. CAN ALL WOMEN WITH THIS PROBLEM USE ESTROGEN? No. Women with certain types of cancer, liver problems, or problems with blood clots should not take estrogen. Your doctor can help you decide the best treatment for your symptoms. Document Released: 07/16/2007 Document Revised: 04/21/2011 Document Reviewed: 07/16/2007 [employer] Patient Information 2014 [employer].  

## 2012-09-07 NOTE — Progress Notes (Signed)
Subjective:     Patient ID: Leah [name], female   DOB: 02-25-1960, 52 y.o.   MRN: [id number]  HPI 52 yo WM Fe her for a recheck on Osphena for vaginal dryness since June 9. Vaginal dryness is much better still with using some lubrication but overall much improved. No other side effects of med's, no breast tenderness.   Review of Systems  Constitutional: Negative.        Went on weight watchers 07/19/12 and has lost 8 lbs.  Respiratory: Negative.   Cardiovascular: Negative.   Gastrointestinal: Negative.   Genitourinary: Negative for urgency, vaginal bleeding, vaginal discharge, vaginal pain, pelvic pain and dyspareunia.  Musculoskeletal: Negative.   Skin: Negative.   Neurological: Negative.  Negative for tremors, syncope, weakness and headaches.       Objective:   Physical Exam  Constitutional: She is oriented to person, place, and time. She appears well-developed and well-nourished.  Pulmonary/Chest: Effort normal.  Abdominal: Soft. She exhibits no distension and no mass. There is no tenderness. There is no rebound and no guarding.  Genitourinary:  Vaginal tissues are improved with less atrophic changes and less discomfort on exam. No pain on bimanual.  Neurological: She is alert and oriented to person, place, and time.  Psychiatric: She has a normal mood and affect. Her behavior is normal. Judgment and thought content normal.       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 Call back if any side effects.   She will continue to be followed by PCP and getting regular LFT's

## 2012-09-08 ENCOUNTER — Ambulatory Visit (HOSPITAL_BASED_OUTPATIENT_CLINIC_OR_DEPARTMENT_OTHER): Payer: BC Managed Care – PPO

## 2012-09-08 VITALS — BP 136/94 | HR 70 | Temp 98.1°F | Resp 18

## 2012-09-08 DIAGNOSIS — E340 Carcinoid syndrome: Secondary | ICD-10-CM

## 2012-09-08 DIAGNOSIS — C7B8 Other secondary neuroendocrine tumors: Secondary | ICD-10-CM

## 2012-09-08 DIAGNOSIS — C7A00 Malignant carcinoid tumor of unspecified site: Secondary | ICD-10-CM

## 2012-09-08 MED ORDER — OCTREOTIDE ACETATE 30 MG IM KIT
30.0000 mg | PACK | Freq: Once | INTRAMUSCULAR | Status: AC
  Administered 2012-09-08: 30 mg via INTRAMUSCULAR

## 2012-09-08 NOTE — Patient Instructions (Signed)

## 2012-09-08 NOTE — Progress Notes (Signed)
Encounter reviewed by Dr. Brook Silva.  

## 2012-09-13 ENCOUNTER — Telehealth: Payer: Self-pay | Admitting: Hematology & Oncology

## 2012-09-13 NOTE — Telephone Encounter (Signed)
Pt cx 8-5 inj

## 2012-09-14 ENCOUNTER — Encounter: Payer: Self-pay | Admitting: Family

## 2012-09-14 ENCOUNTER — Ambulatory Visit: Payer: BC Managed Care – PPO

## 2012-09-14 ENCOUNTER — Ambulatory Visit (INDEPENDENT_AMBULATORY_CARE_PROVIDER_SITE_OTHER): Payer: BC Managed Care – PPO | Admitting: Family

## 2012-09-14 VITALS — BP 120/90 | HR 87 | Temp 98.5°F | Resp 16 | Ht 59.0 in | Wt 200.0 lb

## 2012-09-14 DIAGNOSIS — M1990 Unspecified osteoarthritis, unspecified site: Secondary | ICD-10-CM

## 2012-09-14 DIAGNOSIS — D869 Sarcoidosis, unspecified: Secondary | ICD-10-CM

## 2012-09-14 DIAGNOSIS — F419 Anxiety disorder, unspecified: Secondary | ICD-10-CM

## 2012-09-14 DIAGNOSIS — C7B8 Other secondary neuroendocrine tumors: Secondary | ICD-10-CM

## 2012-09-14 DIAGNOSIS — C7A00 Malignant carcinoid tumor of unspecified site: Secondary | ICD-10-CM

## 2012-09-14 DIAGNOSIS — C787 Secondary malignant neoplasm of liver and intrahepatic bile duct: Secondary | ICD-10-CM

## 2012-09-14 DIAGNOSIS — F411 Generalized anxiety disorder: Secondary | ICD-10-CM

## 2012-09-14 DIAGNOSIS — K219 Gastro-esophageal reflux disease without esophagitis: Secondary | ICD-10-CM

## 2012-09-14 DIAGNOSIS — I10 Essential (primary) hypertension: Secondary | ICD-10-CM

## 2012-09-14 DIAGNOSIS — B009 Herpesviral infection, unspecified: Secondary | ICD-10-CM | POA: Insufficient documentation

## 2012-09-14 NOTE — Assessment & Plan Note (Signed)
BP stable on diovan and amlodipine.  Continue same.

## 2012-09-14 NOTE — Progress Notes (Signed)
Subjective:    Patient ID: Leah [name], female    DOB: 12/13/1960, 52 y.o.   MRN: [id number]  HPI  Leah [name] is a 52 yr old female who presents today to establish care. She is accompanied today by her husband. Previously, she has followed with Dr. Lenord [name]. She has multiple medical problems.  1) Sarcoidosis- reports that this was found by lymph node biopsy when she had mastectomy. She reports hx of chronic cough but no other symptoms from her sarcoid.  2) Osteoarthritis- reports bilateral knee pain. She has lost 12 pounds since June. She is followed by Dr. Clemetine [name]. She is being considered for TKA.  3) Hx of bilateral mastectomy- prophylaxic.  Reports that she had "hundreds of cysts" in her breasts.  Very strong family hx of breast cancer.  Due to hx of ovarian cysts she also had TAH/BSO. She is taking osphena which is helping with her menopausal symptoms as she is not on HRT.    4) Anxiety- she takes effexor xr since her hysterectomy due to "tearfulness and crankiness." Reports that she feels well on effexor.    5) GERD-She takes protonix and reports that her reflux is well controlled.  6) Metastatic neuroendocrine carcinoma of the liver-per pt she had episodes of vomitting and abdominal pain.  She had a partial resection of her liver.  Reports that she has a cyst in her liver and her kidney.  She is receiving sandostatin to keep these cysts from growing.    7) HSV- takes valtrex prn reports that her breakouts are infrequent.    8) HTN-  Amlodipine and diovan.    She is following with Leah [name] to discuss her cervical lordosis.     Review of Systems  Constitutional:       Has lost 12 pounds with diet and exercise  HENT: Negative for hearing loss.   Eyes: Negative for visual disturbance.       Plans to have lasix in her right eye  Respiratory:       Occasional cough  Cardiovascular: Negative for chest pain.  Gastrointestinal: Negative for nausea, vomiting and abdominal  pain.  Genitourinary: Negative for dysuria and frequency.  Musculoskeletal: Positive for arthralgias.  Skin: Negative for rash.       Eczema right eye.    Neurological: Positive for headaches.  Hematological: Negative for adenopathy.  Psychiatric/Behavioral:       See HPI   Past Medical History  Diagnosis Date  . Sarcoidosis 2008  . Allergic rhinitis   . GE reflux   . Neuroendocrine tumor 2011  . Surgical menopause   . Migraines   . Atrophic vaginitis   . STD (sexually transmitted disease)     HSV I  . Hepatic encephalopathy 2011  . HTN (hypertension) 2011    History   Social History  . Marital Status: Married    Spouse Name: N/A    Number of Children: N/A  . Years of Education: N/A   Occupational History  . Not on file.   Social History Main Topics  . Smoking status: Never Smoker   . Smokeless tobacco: Never Used  . Alcohol Use: No     Comment: about 1x q 3 months  . Drug Use: No  . Sexually Active: Yes    Birth Control/ Protection: Surgical     Comment: hysterectomy   Other Topics Concern  . Not on file   Social History Narrative   Married to Leah [name]  2 sons- 2001 Leah [name] and  Leah [name] (1999)   Has dog   She is unemployed, has worked in the past (office work)   Completed some college   Enjoys Engineer, materials    Past Surgical History  Procedure Laterality Date  . Tonsillectomy  1976  . Cesarean section  [id number]  . Cholecystectomy  2006  . Abdominal hysterectomy  2004  . Oophorectomy  2004  . Mastectomy  2008    bilateral  . Bilateral mastectectomy Bilateral 2008  . Latissimus dorsi graft  2008    used for reconstructive breast surgery  . Hepatetetomy    . Tubal ligation  2001    BTL  . Breast surgery      bilateral masectomy and reconstruction with silicone implants  . Knee arthroscopy Left 09/2010    Family History  Problem Relation Age of Onset  . Arthritis Maternal Grandmother   . Arthritis Maternal Grandfather   . Arthritis  Paternal Grandmother   . Arthritis Paternal Grandfather   . Heart failure Paternal Grandfather   . Breast cancer Mother 63    died at 43  . Lupus Mother   . Heart failure Father   . Hypertension Father   . Hyperlipidemia Father   . Breast cancer Maternal Aunt 38    deceased at 45 recurrence  . Ovarian cancer Maternal Aunt 60     died with in 6 months  . Breast cancer Maternal Aunt 40    died at age 30    No Known Allergies  Current Outpatient Prescriptions on File Prior to Visit  Medication Sig Dispense Refill  . albuterol (PROVENTIL HFA;VENTOLIN HFA) 108 (90 BASE) MCG/ACT inhaler Inhale 2 puffs into the lungs every 6 (six) hours as needed. For shortness of breath.      Marland Kitchen amLODipine (NORVASC) 5 MG tablet TAKE 1 TABLET BY MOUTH DAILY  90 tablet  3  . Diclofenac Sodium (PENNSAID) 1.5 % SOLN Place 40 drops onto the skin 4 (four) times daily as needed. For joint pain.      . methocarbamol (ROBAXIN) 500 MG tablet Take 500 mg by mouth 3 (three) times daily.       . metroNIDAZOLE (METROCREAM) 0.75 % cream       . montelukast (SINGULAIR) 10 MG tablet TAKE 1 TABLET AT BEDTIME  90 tablet  3  . naproxen (NAPROSYN) 500 MG tablet Take 1 tablet by mouth 2 (two) times daily.      Marland Kitchen octreotide (SANDOSTATIN LAR) 30 MG injection Inject 30 mg into the muscle every 14 (fourteen) days.      . ondansetron (ZOFRAN) 4 MG tablet Take 4 mg by mouth every 8 (eight) hours as needed. For nausea.      . Ospemifene (OSPHENA) 60 MG TABS Take 1 tablet by mouth daily.  90 tablet  3  . pantoprazole (PROTONIX) 40 MG tablet Take 40 mg by mouth 2 (two) times daily.      . traZODone (DESYREL) 100 MG tablet TAKE 1 TABLET AT BEDTIME  90 tablet  1  . triamcinolone cream (KENALOG) 0.1 % APPLY ON RASH THREE TIMES A DAY  30 g  1  . valACYclovir (VALTREX) 500 MG tablet TAKE 1 TABLET TWO TIMES A DAY  180 tablet  3  . valsartan (DIOVAN) 160 MG tablet Take 1 tablet (160 mg total) by mouth daily.  90 tablet  3  . venlafaxine XR  (EFFEXOR-XR) 150 MG 24 hr capsule TAKE ONE CAPSULE BY  MOUTH EVERY DAY  30 capsule  2   No current facility-administered medications on file prior to visit.    BP 120/90  Pulse 87  Temp(Src) 98.5 F (36.9 C) (Oral)  Resp 16  Ht 4\' 11"  (1.499 m)  Wt 200 lb 0.6 oz (90.738 kg)  BMI 40.38 kg/m2  SpO2 99%       Objective:   Physical Exam  Constitutional: She is oriented to person, place, and time. She appears well-developed and well-nourished. No distress.  HENT:  Head: Normocephalic and atraumatic.  Mouth/Throat: No oropharyngeal exudate, posterior oropharyngeal edema or posterior oropharyngeal erythema.  Bilateral cerumen noted in canals, unable to visualize TM's.   Cardiovascular: Normal rate and regular rhythm.   No murmur heard. Pulmonary/Chest: Effort normal and breath sounds normal. No respiratory distress. She has no wheezes. She has no rales. She exhibits no tenderness.  Musculoskeletal: She exhibits no edema.  Lymphadenopathy:    She has no cervical adenopathy.  Neurological: She is alert and oriented to person, place, and time.  Psychiatric: She has a normal mood and affect. Her behavior is normal. Judgment and thought content normal.          Assessment & Plan:

## 2012-09-14 NOTE — Assessment & Plan Note (Signed)
Stable on effexor. Continue same.  

## 2012-09-14 NOTE — Assessment & Plan Note (Signed)
She continues to follow with Dr. Myna Hidalgo for Sandostatin every 2 weeks. Clinically stable. Management per heme/onc.

## 2012-09-14 NOTE — Assessment & Plan Note (Signed)
Stable on PPI. Continue same.  

## 2012-09-14 NOTE — Assessment & Plan Note (Signed)
Stable on prn valtrex, continue semae.

## 2012-09-14 NOTE — Assessment & Plan Note (Addendum)
Clinically stable. Monitor. She reports that she did have the pneumovax injection in 2011 or 2012.

## 2012-09-14 NOTE — Assessment & Plan Note (Signed)
This is being managed by Dr. Valentina Gu.

## 2012-09-14 NOTE — Addendum Note (Signed)
Addended by: Sandford Craze on: 09/14/2012 10:07 AM   Modules accepted: Level of Service

## 2012-09-14 NOTE — Patient Instructions (Addendum)
Please follow up in 3 months.  Welcome to [facility]!

## 2012-09-21 ENCOUNTER — Ambulatory Visit (HOSPITAL_BASED_OUTPATIENT_CLINIC_OR_DEPARTMENT_OTHER): Payer: BC Managed Care – PPO

## 2012-09-21 VITALS — BP 141/89 | HR 88 | Temp 98.1°F | Resp 18

## 2012-09-21 DIAGNOSIS — C7A00 Malignant carcinoid tumor of unspecified site: Secondary | ICD-10-CM

## 2012-09-21 DIAGNOSIS — E340 Carcinoid syndrome: Secondary | ICD-10-CM

## 2012-09-21 DIAGNOSIS — C7B8 Other secondary neuroendocrine tumors: Secondary | ICD-10-CM

## 2012-09-21 DIAGNOSIS — C787 Secondary malignant neoplasm of liver and intrahepatic bile duct: Secondary | ICD-10-CM

## 2012-09-21 MED ORDER — OCTREOTIDE ACETATE 30 MG IM KIT
30.0000 mg | PACK | Freq: Once | INTRAMUSCULAR | Status: AC
  Administered 2012-09-21: 30 mg via INTRAMUSCULAR

## 2012-09-21 NOTE — Patient Instructions (Signed)

## 2012-09-28 ENCOUNTER — Ambulatory Visit (HOSPITAL_BASED_OUTPATIENT_CLINIC_OR_DEPARTMENT_OTHER): Payer: BC Managed Care – PPO | Admitting: Hematology & Oncology

## 2012-09-28 ENCOUNTER — Other Ambulatory Visit (HOSPITAL_BASED_OUTPATIENT_CLINIC_OR_DEPARTMENT_OTHER): Payer: BC Managed Care – PPO | Admitting: Lab

## 2012-09-28 ENCOUNTER — Ambulatory Visit: Payer: BC Managed Care – PPO

## 2012-09-28 VITALS — BP 133/81 | HR 79 | Temp 98.1°F | Resp 16 | Ht 59.0 in | Wt 198.0 lb

## 2012-09-28 DIAGNOSIS — R197 Diarrhea, unspecified: Secondary | ICD-10-CM

## 2012-09-28 DIAGNOSIS — C7A00 Malignant carcinoid tumor of unspecified site: Secondary | ICD-10-CM

## 2012-09-28 DIAGNOSIS — C7B8 Other secondary neuroendocrine tumors: Secondary | ICD-10-CM

## 2012-09-28 DIAGNOSIS — C787 Secondary malignant neoplasm of liver and intrahepatic bile duct: Secondary | ICD-10-CM

## 2012-09-28 LAB — CBC WITH DIFFERENTIAL (CANCER CENTER ONLY)
BASO#: 0 10*3/uL (ref 0.0–0.2)
Eosinophils Absolute: 0.2 10*3/uL (ref 0.0–0.5)
HGB: 12.6 g/dL (ref 11.6–15.9)
LYMPH#: 2.4 10*3/uL (ref 0.9–3.3)
MCH: 30.8 pg (ref 26.0–34.0)
MONO%: 8.4 % (ref 0.0–13.0)
NEUT#: 4 10*3/uL (ref 1.5–6.5)
Platelets: 230 10*3/uL (ref 145–400)
RBC: 4.09 10*6/uL (ref 3.70–5.32)
WBC: 7.2 10*3/uL (ref 3.9–10.0)

## 2012-09-28 NOTE — Progress Notes (Signed)
No shot needed today. To early will come back next week.

## 2012-09-28 NOTE — Progress Notes (Signed)
CC:   Sandford Craze, NP  DIAGNOSIS:  Metastatic neuroendocrine carcinoma with liver metastasis.  CURRENT THERAPY:  Sandostatin LAR 30 mg IM q.2 weeks.  INTERIM HISTORY:  Leah [name] comes in for followup.  She got her Sandostatin last week.  Apparently this did not help at all that much. She had a lot of bruising associated with this.  I am not sure we can give her a dose today.  She has had some diarrhea.  There has been no flushing.  She has had no wheezing.  Her last chromogranin A level, which is what we monitor with her, was 4.0.  She is losing weight.  She wants to lose weight.  She continues on her list of medications, which is fairly extensive.  PHYSICAL EXAMINATION:  General:  This is a well-developed, well- nourished white female in no obvious distress.  Vital signs: Temperature of 98.1, pulse 79, respiratory rate 16, blood pressure 133/81.  Weight is 198.  Head and neck:  Normocephalic, atraumatic skull.  There are no ocular or oral lesions.  There are no palpable cervical or supraclavicular lymph nodes.  Lungs:  Clear bilaterally. Cardiac:  Regular rate and rhythm with a normal S1 and S2.  There are no murmurs, rubs or bruits.  Abdomen:  Soft.  She has good bowel sounds. There is no fluid wave.  There is no palpable hepatosplenomegaly.  She has well-healed laparotomy scars.  Back:  No tenderness over the spine, ribs, or hips.  Extremities:  No clubbing, cyanosis or edema.  Skin:  No rashes.  Neurologic.  No focal neurological deficits.  LABORATORY STUDIES:  White cell count is 7.2, hemoglobin 12.6, hematocrit 38.3, platelet count 230.  MCV is 94.  IMPRESSION:  Leah [name] is a 52 year old white female with metastatic neuroendocrine carcinoma.  Again, she has isolated liver metastases. From my point of view, she is pretty much asymptomatic.  We have not scanned her for about 3 years.  Again, she has done well.  I have not seen a need to do any scans on her as she  has been relatively asymptomatic.  We will continue her on the Sandostatin every 2 weeks.  I will plan see her back in another 3 months.    ______________________________ Josph Macho, M.D. PRE/MEDQ  D:  09/28/2012  T:  09/28/2012  Job:  [id number]

## 2012-09-28 NOTE — Progress Notes (Signed)
This office note has been dictated.

## 2012-10-01 LAB — COMPREHENSIVE METABOLIC PANEL
AST: 14 U/L (ref 0–37)
Alkaline Phosphatase: 71 U/L (ref 39–117)
BUN: 17 mg/dL (ref 6–23)
Creatinine, Ser: 0.73 mg/dL (ref 0.50–1.10)
Glucose, Bld: 99 mg/dL (ref 70–99)

## 2012-10-05 ENCOUNTER — Ambulatory Visit (HOSPITAL_BASED_OUTPATIENT_CLINIC_OR_DEPARTMENT_OTHER): Payer: BC Managed Care – PPO

## 2012-10-05 VITALS — BP 137/85 | HR 73 | Temp 97.4°F | Resp 20

## 2012-10-05 DIAGNOSIS — R197 Diarrhea, unspecified: Secondary | ICD-10-CM

## 2012-10-05 DIAGNOSIS — C787 Secondary malignant neoplasm of liver and intrahepatic bile duct: Secondary | ICD-10-CM

## 2012-10-05 DIAGNOSIS — C7A00 Malignant carcinoid tumor of unspecified site: Secondary | ICD-10-CM

## 2012-10-05 DIAGNOSIS — C7B8 Other secondary neuroendocrine tumors: Secondary | ICD-10-CM

## 2012-10-05 DIAGNOSIS — E340 Carcinoid syndrome: Secondary | ICD-10-CM

## 2012-10-05 MED ORDER — OCTREOTIDE ACETATE 30 MG IM KIT
30.0000 mg | PACK | Freq: Once | INTRAMUSCULAR | Status: AC
  Administered 2012-10-05: 30 mg via INTRAMUSCULAR

## 2012-10-05 NOTE — Patient Instructions (Signed)

## 2012-10-06 ENCOUNTER — Other Ambulatory Visit: Payer: Self-pay

## 2012-10-06 ENCOUNTER — Emergency Department (HOSPITAL_COMMUNITY)
Admission: EM | Admit: 2012-10-06 | Discharge: 2012-10-06 | Disposition: A | Discharge status: Home / Self Care | Payer: BC Managed Care – PPO | Attending: Emergency Medicine | Admitting: Emergency Medicine

## 2012-10-06 ENCOUNTER — Encounter (HOSPITAL_COMMUNITY): Payer: Self-pay | Admitting: Emergency Medicine

## 2012-10-06 ENCOUNTER — Emergency Department (HOSPITAL_COMMUNITY): Payer: BC Managed Care – PPO

## 2012-10-06 DIAGNOSIS — I10 Essential (primary) hypertension: Secondary | ICD-10-CM | POA: Insufficient documentation

## 2012-10-06 DIAGNOSIS — Z9079 Acquired absence of other genital organ(s): Secondary | ICD-10-CM | POA: Insufficient documentation

## 2012-10-06 DIAGNOSIS — E3400 Carcinoid syndrome, unspecified: Secondary | ICD-10-CM

## 2012-10-06 DIAGNOSIS — Z791 Long term (current) use of non-steroidal anti-inflammatories (NSAID): Secondary | ICD-10-CM | POA: Insufficient documentation

## 2012-10-06 DIAGNOSIS — Z9851 Tubal ligation status: Secondary | ICD-10-CM | POA: Insufficient documentation

## 2012-10-06 DIAGNOSIS — Z79899 Other long term (current) drug therapy: Secondary | ICD-10-CM | POA: Insufficient documentation

## 2012-10-06 DIAGNOSIS — Z8619 Personal history of other infectious and parasitic diseases: Secondary | ICD-10-CM | POA: Insufficient documentation

## 2012-10-06 DIAGNOSIS — Z9889 Other specified postprocedural states: Secondary | ICD-10-CM | POA: Insufficient documentation

## 2012-10-06 DIAGNOSIS — Z8742 Personal history of other diseases of the female genital tract: Secondary | ICD-10-CM | POA: Insufficient documentation

## 2012-10-06 DIAGNOSIS — Z8719 Personal history of other diseases of the digestive system: Secondary | ICD-10-CM | POA: Insufficient documentation

## 2012-10-06 DIAGNOSIS — Z8679 Personal history of other diseases of the circulatory system: Secondary | ICD-10-CM | POA: Insufficient documentation

## 2012-10-06 DIAGNOSIS — C7B8 Other secondary neuroendocrine tumors: Secondary | ICD-10-CM

## 2012-10-06 DIAGNOSIS — R1110 Vomiting, unspecified: Secondary | ICD-10-CM | POA: Insufficient documentation

## 2012-10-06 DIAGNOSIS — C787 Secondary malignant neoplasm of liver and intrahepatic bile duct: Secondary | ICD-10-CM | POA: Insufficient documentation

## 2012-10-06 DIAGNOSIS — K219 Gastro-esophageal reflux disease without esophagitis: Secondary | ICD-10-CM | POA: Insufficient documentation

## 2012-10-06 DIAGNOSIS — Z90710 Acquired absence of both cervix and uterus: Secondary | ICD-10-CM | POA: Insufficient documentation

## 2012-10-06 DIAGNOSIS — Z9089 Acquired absence of other organs: Secondary | ICD-10-CM | POA: Insufficient documentation

## 2012-10-06 DIAGNOSIS — R002 Palpitations: Secondary | ICD-10-CM | POA: Insufficient documentation

## 2012-10-06 HISTORY — DX: Carcinoid syndrome: E34.0

## 2012-10-06 HISTORY — DX: Carcinoid syndrome, unspecified: E34.00

## 2012-10-06 LAB — CBC WITH DIFFERENTIAL/PLATELET
Hemoglobin: 12.7 g/dL (ref 12.0–15.0)
Lymphocytes Relative: 20 % (ref 12–46)
Lymphs Abs: 3 10*3/uL (ref 0.7–4.0)
MCV: 90.7 fL (ref 78.0–100.0)
Monocytes Relative: 6 % (ref 3–12)
Neutrophils Relative %: 73 % (ref 43–77)
Platelets: 245 10*3/uL (ref 150–400)
RBC: 4.19 MIL/uL (ref 3.87–5.11)
WBC: 15.2 10*3/uL — ABNORMAL HIGH (ref 4.0–10.5)

## 2012-10-06 LAB — POCT I-STAT, CHEM 8
Calcium, Ion: 1.13 mmol/L (ref 1.12–1.23)
Chloride: 108 mEq/L (ref 96–112)
Glucose, Bld: 132 mg/dL — ABNORMAL HIGH (ref 70–99)
HCT: 38 % (ref 36.0–46.0)

## 2012-10-06 LAB — URINE MICROSCOPIC-ADD ON

## 2012-10-06 LAB — URINALYSIS, ROUTINE W REFLEX MICROSCOPIC
Bilirubin Urine: NEGATIVE
Ketones, ur: NEGATIVE mg/dL
Nitrite: NEGATIVE
Protein, ur: NEGATIVE mg/dL
Urobilinogen, UA: 0.2 mg/dL (ref 0.0–1.0)
pH: 5 (ref 5.0–8.0)

## 2012-10-06 LAB — COMPREHENSIVE METABOLIC PANEL
ALT: 15 U/L (ref 0–35)
Alkaline Phosphatase: 71 U/L (ref 39–117)
CO2: 24 mEq/L (ref 19–32)
Chloride: 103 mEq/L (ref 96–112)
GFR calc Af Amer: >90 mL/min (ref >90)
GFR calc non Af Amer: >90 mL/min (ref >90)
Glucose, Bld: 135 mg/dL — ABNORMAL HIGH (ref 70–99)
Potassium: 4.1 mEq/L (ref 3.5–5.1)
Sodium: 136 mEq/L (ref 135–145)
Total Bilirubin: 0.2 mg/dL — ABNORMAL LOW (ref 0.3–1.2)

## 2012-10-06 MED ORDER — ONDANSETRON HCL 4 MG/2ML IJ SOLN
4.0000 mg | Freq: Once | INTRAMUSCULAR | Status: AC
  Administered 2012-10-06: 4 mg via INTRAVENOUS
  Filled 2012-10-06: qty 2

## 2012-10-06 MED ORDER — SODIUM CHLORIDE 0.9 % IV BOLUS (SEPSIS)
1000.0000 mL | Freq: Once | INTRAVENOUS | Status: AC
  Administered 2012-10-06: 1000 mL via INTRAVENOUS

## 2012-10-06 MED ORDER — OCTREOTIDE ACETATE 30 MG IM KIT: 30.0000 mg | PACK | Freq: Once | INTRAMUSCULAR | Status: DC

## 2012-10-06 MED ORDER — OCTREOTIDE ACETATE 100 MCG/ML IJ SOLN
50.0000 ug | Freq: Once | INTRAMUSCULAR | Status: AC
  Administered 2012-10-06: 50 ug via SUBCUTANEOUS
  Filled 2012-10-06: qty 0.5

## 2012-10-06 MED ORDER — DIPHENHYDRAMINE HCL 50 MG/ML IJ SOLN
25.0000 mg | Freq: Once | INTRAMUSCULAR | Status: AC
  Administered 2012-10-06: 25 mg via INTRAVENOUS
  Filled 2012-10-06: qty 1

## 2012-10-06 MED ORDER — FAMOTIDINE IN NACL 20-0.9 MG/50ML-% IV SOLN
20.0000 mg | Freq: Once | INTRAVENOUS | Status: AC
  Administered 2012-10-06: 20 mg via INTRAVENOUS
  Filled 2012-10-06: qty 50

## 2012-10-06 NOTE — ED Notes (Signed)
Pt states that she has neuroendocrine carcinoid cancer.  Has had bilateral mastectomy, resection of liver.  States that sometimes she gets carcinoid syndrome.  States that she has been having heart palpitations and NV.  States that this is how she normally feels when she is starting to get carcinoid syndrome.

## 2012-10-06 NOTE — Consult Note (Signed)
[facility] Consult note  Patient: Leah [name]  WJX:[id number]  DOB: 1960/04/27  DOA: 10/06/2012  Referring physician: Wynetta Emery, PA-C PCP: Lemont Fillers., NP   Chief Complaint: flushing and palpitation  HPI: Leah [name] is a 52 y.o. female with Past medical history of carcinoid syndrome with liver metastasis, sarcoidosis, GERD, hypertension. She presented today in the ED with the complaint of vomiting 3 times without any blood, mild abdominal pain flushing and palpitation. She mentions that the symptoms occur every time when she has a flare of carcinoid syndrome. The symptoms also occur at the end of her somatostatin cycle which is every three-week at present. She mentions last time she had an injection of octreotide yesterday. At present Pt denies any fever, chills, headache, cough, chest pain, palpitation, shortness of breath, orthopnea, PND, nausea, vomiting, abdominal pain, diarrhea, constipation, active bleeding, burning urination, dizziness, pedal edema,  focal neurological deficit.  Review of Systems: as mentioned in the history of present illness.  A Comprehensive review of the other systems is negative.  Past Medical History  Diagnosis Date  . Sarcoidosis 2008  . Allergic rhinitis   . GE reflux   . Neuroendocrine tumor 2011  . Surgical menopause   . Migraines   . Atrophic vaginitis   . STD (sexually transmitted disease)     HSV I  . Hepatic encephalopathy 2011  . HTN (hypertension) 2011  . Carcinoid syndrome    Past Surgical History  Procedure Laterality Date  . Tonsillectomy  1976  . Cesarean section  [id number]  . Cholecystectomy  2006  . Abdominal hysterectomy  2004  . Oophorectomy  2004  . Mastectomy  2008    bilateral  . Bilateral mastectectomy Bilateral 2008  . Latissimus dorsi graft  2008    used for reconstructive breast surgery  . Hepatetetomy    . Tubal ligation  2001    BTL  . Breast surgery      bilateral masectomy and  reconstruction with silicone implants  . Knee arthroscopy Left 09/2010   Social History:  reports that she has never smoked. She has never used smokeless tobacco. She reports that she does not drink alcohol or use illicit drugs. Patient is coming from home. Independent for most of her  ADL.  No Known Allergies  Family History  Problem Relation Age of Onset  . Arthritis Maternal Grandmother   . Arthritis Maternal Grandfather   . Arthritis Paternal Grandmother   . Arthritis Paternal Grandfather   . Heart failure Paternal Grandfather   . Breast cancer Mother 6    died at 48  . Lupus Mother   . Heart failure Father   . Hypertension Father   . Hyperlipidemia Father   . Breast cancer Maternal Aunt 38    deceased at 82 recurrence  . Ovarian cancer Maternal Aunt 60     died with in 6 months  . Breast cancer Maternal Aunt 40    died at age 59    Prior to Admission medications   Medication Sig Start Date End Date Taking? Authorizing Provider  albuterol (PROVENTIL HFA;VENTOLIN HFA) 108 (90 BASE) MCG/ACT inhaler Inhale 2 puffs into the lungs every 6 (six) hours as needed. For shortness of breath.   Yes Historical Provider, MD  amLODipine (NORVASC) 5 MG tablet TAKE 1 TABLET BY MOUTH DAILY 05/03/12  Yes Margaree Mackintosh, MD  Diclofenac Sodium (PENNSAID) 1.5 % SOLN Place 40 drops onto the skin 4 (four) times daily as needed.  For joint pain.   Yes Historical Provider, MD  montelukast (SINGULAIR) 10 MG tablet TAKE 1 TABLET AT BEDTIME 09/18/11  Yes Margaree Mackintosh, MD  naproxen (NAPROSYN) 500 MG tablet Take 1 tablet by mouth 2 (two) times daily. 08/12/12  Yes Historical Provider, MD  octreotide (SANDOSTATIN LAR) 30 MG injection Inject 30 mg into the muscle every 14 (fourteen) days.   Yes Historical Provider, MD  ondansetron (ZOFRAN) 4 MG tablet Take 4 mg by mouth every 8 (eight) hours as needed. For nausea. 02/26/11  Yes Historical Provider, MD  Ospemifene (OSPHENA) 60 MG TABS Take 1 tablet by mouth daily.  09/07/12  Yes Patricia Rolen-Grubb, FNP  pantoprazole (PROTONIX) 40 MG tablet Take 40 mg by mouth 2 (two) times daily. 05/03/12  Yes Margaree Mackintosh, MD  triamcinolone cream (KENALOG) 0.1 % APPLY ON RASH THREE TIMES A DAY 02/06/12  Yes Margaree Mackintosh, MD  valACYclovir (VALTREX) 500 MG tablet TAKE 1 TABLET TWO TIMES A DAY 07/15/12  Yes Patricia Rolen-Grubb, FNP  valsartan (DIOVAN) 160 MG tablet Take 1 tablet (160 mg total) by mouth daily. 05/03/12  Yes Margaree Mackintosh, MD  venlafaxine XR (EFFEXOR-XR) 150 MG 24 hr capsule TAKE ONE CAPSULE BY MOUTH EVERY DAY 05/03/12  Yes Margaree Mackintosh, MD    Physical Exam: Filed Vitals:   10/06/12 1836 10/06/12 2247  BP: 174/94 142/85  Pulse: 105 79  Temp: 98.8 F (37.1 C) 97.9 F (36.6 C)  TempSrc: Oral Oral  Resp: 18 22  SpO2: 100% 99%    General: Alert, Awake and Oriented to Time, Place and Person. Appear in no distress Eyes: PERRL ENT: Oral Mucosa clear moist. Neck: No JVD, no Carotid Bruits  Cardiovascular: S1 and S2 Present, no Murmur, Peripheral Pulses Present Respiratory: Bilateral Air entry equal and Decreased, Clear to Auscultation,  No Crackles, no wheezes Abdomen: Bowel Sound Present, Soft and Non tender Skin: No flushing, no Rash Extremities: No Pedal edema, no calf tenderness Neurologic: Grossly Unremarkable.  Labs on Admission:  CBC:  Recent Labs Lab 10/06/12 1916 10/06/12 1926  WBC 15.2*  --   NEUTROABS 11.1*  --   HGB 12.7 12.9  HCT 38.0 38.0  MCV 90.7  --   PLT 245  --     CMP     Component Value Date/Time   NA 140 10/06/2012 1926   NA 142 11/02/2008 1450   K 4.1 10/06/2012 1926   K 3.8 11/02/2008 1450   CL 108 10/06/2012 1926   CL 101 11/02/2008 1450   CO2 24 10/06/2012 1916   CO2 28 11/02/2008 1450   GLUCOSE 132* 10/06/2012 1926   GLUCOSE 101 11/02/2008 1450   BUN 25* 10/06/2012 1926   BUN 14 11/02/2008 1450   CREATININE 0.60 10/06/2012 1926   CREATININE 0.78 07/22/2012 1141   CALCIUM 8.9 10/06/2012 1916   CALCIUM 9.4  11/02/2008 1450   PROT 7.0 10/06/2012 1916   ALBUMIN 3.4* 10/06/2012 1916   AST 19 10/06/2012 1916   ALT 15 10/06/2012 1916   ALKPHOS 71 10/06/2012 1916   BILITOT 0.2* 10/06/2012 1916   GFRNONAA >90 10/06/2012 1916   GFRAA >90 10/06/2012 1916   Troponin (Point of Care Test)  Recent Labs  10/06/12 1924  TROPIPOC 0.00   Radiological Exams on Admission: Dg Chest 2 View  10/06/2012   CLINICAL DATA:  Shortness of breath. Hypertension.  EXAM: CHEST  2 VIEW  COMPARISON:  10/18/2010.  FINDINGS: Improved inspiration. Normal sized heart. Clear  lungs with normal vascularity. There is flattening of the hemidiaphragms on the lateral view and there is mild diffuse peribronchial thickening and accentuation of the interstitial markings. Bilateral posterior and anterior soft tissue surgical clips. Cholecystectomy clips. Minimal thoracic spine degenerative changes.  IMPRESSION: 1. No acute abnormality. 2. Mild changes of COPD and chronic bronchitis.   Electronically Signed   By: Gordan Payment   On: 10/06/2012 19:39    EKG: Independently reviewed. sinus tachycardia.  Assessment/Plan 1. history of carcinoid syndrome. Currently her symptoms are completely resolved and patient feels she is at her baseline. She denies any active symptoms at present, and requesting to go home.  Her EKG does not show any significant ST-T wave abnormality, initial troponin is negative.  Her other lab only show mild leukocytosis which is likely secondary to stress, and rest of the lab work appears normal or within normal limits.  Her chest x-ray does not show any active infiltrate. Her examination at present is benign, and she is hemodynamically stable.  The patient lives 10 minutes away from the hospital, and has good family support. She has followups coming up with hematology PA and [facility] for her carcinoid syndrome.  She has been recommended to be seen by her PCP within one week for followup. The case was also discussed  with on-call medical oncologist, who based on the phone discussion physical the patient is asymptomatic, and comfortable going home and discharged from the ED. This was informed to the ED physician, who will arrange for the discharge.  Author: Lynden Oxford, MD [facility] Pager: [phone] 10/06/2012, 10:56 PM    If 7PM-7AM, please contact night-coverage [email or web address] Password [email or web address]

## 2012-10-06 NOTE — ED Provider Notes (Signed)
CSN: [id number]     Arrival date & time 10/06/12  1824 History   First MD Initiated Contact with Patient 10/06/12 1830     Chief Complaint  Patient presents with  . Cancer  . Palpitations   (Consider location/radiation/quality/duration/timing/severity/associated sxs/prior Treatment) HPI  LUTRICIA WIDJAJA is a 52 y.o. female past medical history significant for sarcoidosis, neuro endocrine carcinoid cancer, history of hepatic encephalopathy and carcinoid syndrome, she thinks that she is having an episode of carcinoid syndrome today. Patient is having flushing, hot flashes, palpitations, and multiple episodes of nonbloody, nonbilious, no coffee ground emesis starting earlier in the day and progressively worsening  Past Medical History  Diagnosis Date  . Sarcoidosis 2008  . Allergic rhinitis   . GE reflux   . Neuroendocrine tumor 2011  . Surgical menopause   . Migraines   . Atrophic vaginitis   . STD (sexually transmitted disease)     HSV I  . Hepatic encephalopathy 2011  . HTN (hypertension) 2011  . Carcinoid syndrome    Past Surgical History  Procedure Laterality Date  . Tonsillectomy  1976  . Cesarean section  [id number]  . Cholecystectomy  2006  . Abdominal hysterectomy  2004  . Oophorectomy  2004  . Mastectomy  2008    bilateral  . Bilateral mastectectomy Bilateral 2008  . Latissimus dorsi graft  2008    used for reconstructive breast surgery  . Hepatetetomy    . Tubal ligation  2001    BTL  . Breast surgery      bilateral masectomy and reconstruction with silicone implants  . Knee arthroscopy Left 09/2010   Family History  Problem Relation Age of Onset  . Arthritis Maternal Grandmother   . Arthritis Maternal Grandfather   . Arthritis Paternal Grandmother   . Arthritis Paternal Grandfather   . Heart failure Paternal Grandfather   . Breast cancer Mother 46    died at 30  . Lupus Mother   . Heart failure Father   . Hypertension Father   . Hyperlipidemia Father    . Breast cancer Maternal Aunt 38    deceased at 67 recurrence  . Ovarian cancer Maternal Aunt 60     died with in 6 months  . Breast cancer Maternal Aunt 40    died at age 19   History  Substance Use Topics  . Smoking status: Never Smoker   . Smokeless tobacco: Never Used  . Alcohol Use: No     Comment: about 1x q 3 months   OB History   Grav Para Term Preterm Abortions TAB SAB Ect Mult Living   2 2 2       2      Review of Systems 10 systems reviewed and found to be negative, except as noted in the HPI  Allergies  Review of patient's allergies indicates no known allergies.  Home Medications   Current Outpatient Rx  Name  Route  Sig  Dispense  Refill  . albuterol (PROVENTIL HFA;VENTOLIN HFA) 108 (90 BASE) MCG/ACT inhaler   Inhalation   Inhale 2 puffs into the lungs every 6 (six) hours as needed. For shortness of breath.         Marland Kitchen amLODipine (NORVASC) 5 MG tablet      TAKE 1 TABLET BY MOUTH DAILY   90 tablet   3   . Diclofenac Sodium (PENNSAID) 1.5 % SOLN   Transdermal   Place 40 drops onto the skin 4 (four) times daily  as needed. For joint pain.         . methocarbamol (ROBAXIN) 500 MG tablet   Oral   Take 500 mg by mouth 3 (three) times daily.          . montelukast (SINGULAIR) 10 MG tablet      TAKE 1 TABLET AT BEDTIME   90 tablet   3   . naproxen (NAPROSYN) 500 MG tablet   Oral   Take 1 tablet by mouth 2 (two) times daily.         Marland Kitchen octreotide (SANDOSTATIN LAR) 30 MG injection   Intramuscular   Inject 30 mg into the muscle every 14 (fourteen) days.         . ondansetron (ZOFRAN) 4 MG tablet   Oral   Take 4 mg by mouth every 8 (eight) hours as needed. For nausea.         . Ospemifene (OSPHENA) 60 MG TABS   Oral   Take 1 tablet by mouth daily.   90 tablet   3   . pantoprazole (PROTONIX) 40 MG tablet   Oral   Take 40 mg by mouth 2 (two) times daily.         . traZODone (DESYREL) 100 MG tablet      TAKE 1 TABLET AT BEDTIME    90 tablet   1   . triamcinolone cream (KENALOG) 0.1 %      APPLY ON RASH THREE TIMES A DAY   30 g   1   . valACYclovir (VALTREX) 500 MG tablet      TAKE 1 TABLET TWO TIMES A DAY   180 tablet   3   . valsartan (DIOVAN) 160 MG tablet   Oral   Take 1 tablet (160 mg total) by mouth daily.   90 tablet   3   . venlafaxine XR (EFFEXOR-XR) 150 MG 24 hr capsule      TAKE ONE CAPSULE BY MOUTH EVERY DAY   30 capsule   2    BP 174/94  Pulse 105  Temp(Src) 98.8 F (37.1 C) (Oral)  Resp 18  SpO2 100% Physical Exam  Nursing note and vitals reviewed. Constitutional: She is oriented to person, place, and time. She appears well-developed and well-nourished. No distress.  HENT:  Head: Normocephalic.  Mouth/Throat: Oropharynx is clear and moist.  Eyes: Conjunctivae and EOM are normal. Pupils are equal, round, and reactive to light.  Cardiovascular: Normal rate, regular rhythm and intact distal pulses.   Pulmonary/Chest: Effort normal and breath sounds normal. No stridor.  Abdominal: Soft. Bowel sounds are normal. She exhibits no distension. There is no tenderness. There is no rebound and no guarding.  Musculoskeletal: Normal range of motion.  Neurological: She is alert and oriented to person, place, and time.  Psychiatric: She has a normal mood and affect.    ED Course  Procedures (including critical care time) Labs Review Labs Reviewed  CBC WITH DIFFERENTIAL - Abnormal; Notable for the following:    WBC 15.2 (*)    Neutro Abs 11.1 (*)    All other components within normal limits  COMPREHENSIVE METABOLIC PANEL - Abnormal; Notable for the following:    Glucose, Bld 135 (*)    Albumin 3.4 (*)    Total Bilirubin 0.2 (*)    All other components within normal limits  URINALYSIS, ROUTINE W REFLEX MICROSCOPIC - Abnormal; Notable for the following:    APPearance CLOUDY (*)    Specific Gravity, Urine  1.031 (*)    Hgb urine dipstick TRACE (*)    All other components within normal  limits  URINE MICROSCOPIC-ADD ON - Abnormal; Notable for the following:    Squamous Epithelial / LPF FEW (*)    All other components within normal limits  POCT I-STAT, CHEM 8 - Abnormal; Notable for the following:    BUN 25 (*)    Glucose, Bld 132 (*)    All other components within normal limits  POCT I-STAT TROPONIN I   Imaging Review Dg Chest 2 View  10/06/2012   CLINICAL DATA:  Shortness of breath. Hypertension.  EXAM: CHEST  2 VIEW  COMPARISON:  10/18/2010.  FINDINGS: Improved inspiration. Normal sized heart. Clear lungs with normal vascularity. There is flattening of the hemidiaphragms on the lateral view and there is mild diffuse peribronchial thickening and accentuation of the interstitial markings. Bilateral posterior and anterior soft tissue surgical clips. Cholecystectomy clips. Minimal thoracic spine degenerative changes.  IMPRESSION: 1. No acute abnormality. 2. Mild changes of COPD and chronic bronchitis.   Electronically Signed   By: Gordan Payment   On: 10/06/2012 19:39     Date: 10/06/2012  Rate: 99  Rhythm: normal sinus rhythm  QRS Axis: normal  Intervals: normal  ST/T Wave abnormalities: normal  Conduction Disutrbances:none  Narrative Interpretation:   Old EKG Reviewed: unchanged   MDM   1. Carcinoid syndrome   2. 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 Patient is given fluids, subcutaneous octreotide Pepcid and Benadryl  Oncology consult from Dr. Roanna Raider appreciated: He recommends that since she has palpitations she may be appropriate for her admission under STATUS.  Consult from [facility] hospitalist Dr. Allena Katz appreciated he has evaluated the patient states that he doesn't feel that inpatient observation status as  necessary. He is discussed this with the oncologist in they both agreed. I do not feel strongly that she needs to stay either. She is very reasonable, she has good care at home and knows her condition well, I feel she will return to the emergency room if needed.   Medications  sodium chloride 0.9 % bolus 1,000 mL (0 mLs Intravenous Stopped 10/06/12 2009)  ondansetron (ZOFRAN) injection 4 mg (4 mg Intravenous Given 10/06/12 1922)  diphenhydrAMINE (BENADRYL) injection 25 mg (25 mg Intravenous Given 10/06/12 1940)  famotidine (PEPCID) IVPB 20 mg (0 mg Intravenous Stopped 10/06/12 2009)  octreotide (SANDOSTATIN) injection 50 mcg (50 mcg Subcutaneous Given 10/06/12 2009)    Pt is hemodynamically stable, appropriate for, and amenable to discharge at this time. Pt verbalized understanding and agrees with care plan. All questions answered. Outpatient follow-up and specific return precautions discussed.    Note: Portions of this report may have been transcribed using voice recognition software. Every effort was made to ensure accuracy; however, inadvertent computerized transcription errors may be present      Wynetta Emery, PA-C 10/07/12 0023

## 2012-10-06 NOTE — ED Notes (Signed)
Hospitalist in with pt

## 2012-10-07 ENCOUNTER — Telehealth: Payer: Self-pay | Admitting: Family

## 2012-10-07 NOTE — Telephone Encounter (Signed)
Please arrange a 1 week ED follow up.  

## 2012-10-08 NOTE — Telephone Encounter (Signed)
Appointment scheduled for 10/15/12

## 2012-10-12 NOTE — ED Provider Notes (Signed)
Medical screening examination/treatment/procedure(s) were performed by non-physician practitioner and as supervising physician I was immediately available for consultation/collaboration.   Martena Emanuele, MD 10/12/12 1700 

## 2012-10-15 ENCOUNTER — Ambulatory Visit (INDEPENDENT_AMBULATORY_CARE_PROVIDER_SITE_OTHER): Payer: BC Managed Care – PPO | Admitting: Family

## 2012-10-15 ENCOUNTER — Encounter: Payer: Self-pay | Admitting: Family

## 2012-10-15 VITALS — BP 122/86 | HR 102 | Temp 98.3°F | Resp 16 | Wt 199.1 lb

## 2012-10-15 DIAGNOSIS — R002 Palpitations: Secondary | ICD-10-CM

## 2012-10-15 DIAGNOSIS — C7B8 Other secondary neuroendocrine tumors: Secondary | ICD-10-CM

## 2012-10-15 DIAGNOSIS — D72819 Decreased white blood cell count, unspecified: Secondary | ICD-10-CM

## 2012-10-15 DIAGNOSIS — C7A00 Malignant carcinoid tumor of unspecified site: Secondary | ICD-10-CM

## 2012-10-15 DIAGNOSIS — C787 Secondary malignant neoplasm of liver and intrahepatic bile duct: Secondary | ICD-10-CM

## 2012-10-15 DIAGNOSIS — D72829 Elevated white blood cell count, unspecified: Secondary | ICD-10-CM

## 2012-10-15 LAB — CBC WITH DIFFERENTIAL/PLATELET
Eosinophils Relative: 1 % (ref 0–5)
Lymphocytes Relative: 28 % (ref 12–46)
Lymphs Abs: 2.8 10*3/uL (ref 0.7–4.0)
MCV: 88.8 fL (ref 78.0–100.0)
Neutrophils Relative %: 65 % (ref 43–77)
Platelets: 316 10*3/uL (ref 150–400)
RBC: 4.2 MIL/uL (ref 3.87–5.11)
WBC: 10.3 10*3/uL (ref 4.0–10.5)

## 2012-10-15 NOTE — Assessment & Plan Note (Signed)
Clinically improved.  Management per oncology. Due to recent complaint of palpitations in ED will check a TSH.

## 2012-10-15 NOTE — Patient Instructions (Addendum)
Please complete lab work prior to leaving. Follow up in 3 months, sooner if problems/concerns.  

## 2012-10-15 NOTE — Progress Notes (Signed)
Subjective:    Patient ID: Leah [name], female    DOB: 1960-08-20, 52 y.o.   MRN: [id number]  HPI  Leah [name] is a 52 yr old female who presents today for ED follow up. Records are reviewed.   She was seen in the ED on 8/28. She noted flushing, palpitations, and emesis prior to presentation to the ED. These symptoms occurred at the end of her somatostatin cycle.   Lab work during her visit was notable for leukocytosis with a white count of 15.2. CXR noted some chronic change of copd and chronic bronchitis, but no acute abnormalities.    Since returning home she reports that she still has intermittent abdominal pain. Reports constipation alternating with diarrhea for a few days.  Still had some queasiness.  Reports that these symptoms have been easing up.  She continues to follow with Dr. Myna Hidalgo and a liver specialist Dr. Flonnie Hailstone.   Review of Systems See HPI  Past Medical History  Diagnosis Date  . Sarcoidosis 2008  . Allergic rhinitis   . GE reflux   . Neuroendocrine tumor 2011  . Surgical menopause   . Migraines   . Atrophic vaginitis   . STD (sexually transmitted disease)     HSV I  . Hepatic encephalopathy 2011  . HTN (hypertension) 2011  . Carcinoid syndrome     History   Social History  . Marital Status: Married    Spouse Name: N/A    Number of Children: N/A  . Years of Education: N/A   Occupational History  . Not on file.   Social History Main Topics  . Smoking status: Never Smoker   . Smokeless tobacco: Never Used  . Alcohol Use: No     Comment: about 1x q 3 months  . Drug Use: No  . Sexual Activity: Yes    Birth Control/ Protection: Surgical     Comment: hysterectomy   Other Topics Concern  . Not on file   Social History Narrative   Married to [place]   2 sons- 2001 [name] and  alex (1999)   Has dog   She is unemployed, has worked in the past (office work)   Completed some college   Enjoys Engineer, materials    Past Surgical History  Procedure  Laterality Date  . Tonsillectomy  1976  . Cesarean section  [id number]  . Cholecystectomy  2006  . Abdominal hysterectomy  2004  . Oophorectomy  2004  . Mastectomy  2008    bilateral  . Bilateral mastectectomy Bilateral 2008  . Latissimus dorsi graft  2008    used for reconstructive breast surgery  . Hepatetetomy    . Tubal ligation  2001    BTL  . Breast surgery      bilateral masectomy and reconstruction with silicone implants  . Knee arthroscopy Left 09/2010    Family History  Problem Relation Age of Onset  . Arthritis Maternal Grandmother   . Arthritis Maternal Grandfather   . Arthritis Paternal Grandmother   . Arthritis Paternal Grandfather   . Heart failure Paternal Grandfather   . Breast cancer Mother 24    died at 85  . Lupus Mother   . Heart failure Father   . Hypertension Father   . Hyperlipidemia Father   . Breast cancer Maternal Aunt 38    deceased at 6 recurrence  . Ovarian cancer Maternal Aunt 60     died with in 6 months  . Breast cancer  Maternal Aunt 40    died at age 53    No Known Allergies  Current Outpatient Prescriptions on File Prior to Visit  Medication Sig Dispense Refill  . albuterol (PROVENTIL HFA;VENTOLIN HFA) 108 (90 BASE) MCG/ACT inhaler Inhale 2 puffs into the lungs every 6 (six) hours as needed. For shortness of breath.      Marland Kitchen amLODipine (NORVASC) 5 MG tablet TAKE 1 TABLET BY MOUTH DAILY  90 tablet  3  . Diclofenac Sodium (PENNSAID) 1.5 % SOLN Place 40 drops onto the skin 4 (four) times daily as needed. For joint pain.      . montelukast (SINGULAIR) 10 MG tablet TAKE 1 TABLET AT BEDTIME  90 tablet  3  . naproxen (NAPROSYN) 500 MG tablet Take 1 tablet by mouth 2 (two) times daily.      Marland Kitchen octreotide (SANDOSTATIN LAR) 30 MG injection Inject 30 mg into the muscle every 14 (fourteen) days.      . ondansetron (ZOFRAN) 4 MG tablet Take 4 mg by mouth every 8 (eight) hours as needed. For nausea.      . Ospemifene (OSPHENA) 60 MG TABS Take 1  tablet by mouth daily.  90 tablet  3  . pantoprazole (PROTONIX) 40 MG tablet Take 40 mg by mouth 2 (two) times daily.      Marland Kitchen triamcinolone cream (KENALOG) 0.1 % APPLY ON RASH THREE TIMES A DAY  30 g  1  . valACYclovir (VALTREX) 500 MG tablet TAKE 1 TABLET TWO TIMES A DAY  180 tablet  3  . valsartan (DIOVAN) 160 MG tablet Take 1 tablet (160 mg total) by mouth daily.  90 tablet  3  . venlafaxine XR (EFFEXOR-XR) 150 MG 24 hr capsule TAKE ONE CAPSULE BY MOUTH EVERY DAY  30 capsule  2   No current facility-administered medications on file prior to visit.    BP 122/86  Pulse 102  Temp(Src) 98.3 F (36.8 C) (Oral)  Resp 16  Wt 199 lb 1.9 oz (90.32 kg)  BMI 40.2 kg/m2  SpO2 99%       Objective:   Physical Exam  Constitutional: She is oriented to person, place, and time. She appears well-developed and well-nourished.  Cardiovascular: Normal rate and regular rhythm.   No murmur heard. Pulmonary/Chest: Effort normal and breath sounds normal. No respiratory distress. She has no wheezes. She has no rales. She exhibits no tenderness.  Neurological: She is alert and oriented to person, place, and time.  Psychiatric: She has a normal mood and affect. Her behavior is normal. Judgment and thought content normal.          Assessment & Plan:

## 2012-10-15 NOTE — Assessment & Plan Note (Signed)
Repeat cbc with diff 

## 2012-10-19 ENCOUNTER — Ambulatory Visit (HOSPITAL_BASED_OUTPATIENT_CLINIC_OR_DEPARTMENT_OTHER): Payer: BC Managed Care – PPO

## 2012-10-19 VITALS — BP 136/78 | HR 88 | Temp 98.2°F | Resp 16

## 2012-10-19 DIAGNOSIS — C7B8 Other secondary neuroendocrine tumors: Secondary | ICD-10-CM

## 2012-10-19 DIAGNOSIS — C7A00 Malignant carcinoid tumor of unspecified site: Secondary | ICD-10-CM

## 2012-10-19 DIAGNOSIS — E340 Carcinoid syndrome: Secondary | ICD-10-CM

## 2012-10-19 DIAGNOSIS — C787 Secondary malignant neoplasm of liver and intrahepatic bile duct: Secondary | ICD-10-CM

## 2012-10-19 MED ORDER — OCTREOTIDE ACETATE 30 MG IM KIT
30.0000 mg | PACK | Freq: Once | INTRAMUSCULAR | Status: AC
  Administered 2012-10-19: 30 mg via INTRAMUSCULAR

## 2012-10-25 ENCOUNTER — Other Ambulatory Visit: Payer: Self-pay | Admitting: Internal Medicine

## 2012-10-25 NOTE — Telephone Encounter (Signed)
Refill x 6 months 

## 2012-10-26 ENCOUNTER — Other Ambulatory Visit: Payer: Self-pay | Admitting: *Deleted

## 2012-10-26 MED ORDER — VENLAFAXINE HCL ER 150 MG PO CP24: 150.0000 mg | ORAL_CAPSULE | Freq: Every day | ORAL | Status: DC

## 2012-11-02 ENCOUNTER — Ambulatory Visit (HOSPITAL_BASED_OUTPATIENT_CLINIC_OR_DEPARTMENT_OTHER): Payer: BC Managed Care – PPO

## 2012-11-02 VITALS — BP 134/94 | HR 93 | Temp 97.0°F | Resp 18

## 2012-11-02 DIAGNOSIS — C787 Secondary malignant neoplasm of liver and intrahepatic bile duct: Secondary | ICD-10-CM

## 2012-11-02 DIAGNOSIS — E340 Carcinoid syndrome: Secondary | ICD-10-CM

## 2012-11-02 DIAGNOSIS — C7A00 Malignant carcinoid tumor of unspecified site: Secondary | ICD-10-CM

## 2012-11-02 DIAGNOSIS — C7B8 Other secondary neuroendocrine tumors: Secondary | ICD-10-CM

## 2012-11-02 MED ORDER — OCTREOTIDE ACETATE 30 MG IM KIT
30.0000 mg | PACK | Freq: Once | INTRAMUSCULAR | Status: AC
  Administered 2012-11-02: 30 mg via INTRAMUSCULAR

## 2012-11-02 MED ORDER — OCTREOTIDE ACETATE 30 MG IM KIT
PACK | INTRAMUSCULAR | Status: AC
  Filled 2012-11-02: qty 1

## 2012-11-02 NOTE — Patient Instructions (Signed)

## 2012-11-14 ENCOUNTER — Other Ambulatory Visit: Payer: Self-pay | Admitting: Internal Medicine

## 2012-11-16 ENCOUNTER — Ambulatory Visit (HOSPITAL_BASED_OUTPATIENT_CLINIC_OR_DEPARTMENT_OTHER): Payer: BC Managed Care – PPO

## 2012-11-16 VITALS — BP 151/97 | HR 83 | Temp 97.8°F | Resp 18

## 2012-11-16 DIAGNOSIS — C7A00 Malignant carcinoid tumor of unspecified site: Secondary | ICD-10-CM

## 2012-11-16 DIAGNOSIS — E340 Carcinoid syndrome: Secondary | ICD-10-CM

## 2012-11-16 DIAGNOSIS — C7B8 Other secondary neuroendocrine tumors: Secondary | ICD-10-CM

## 2012-11-16 MED ORDER — OCTREOTIDE ACETATE 30 MG IM KIT
30.0000 mg | PACK | Freq: Once | INTRAMUSCULAR | Status: AC
  Administered 2012-11-16: 30 mg via INTRAMUSCULAR

## 2012-11-16 MED ORDER — OCTREOTIDE ACETATE 30 MG IM KIT
PACK | INTRAMUSCULAR | Status: AC
  Filled 2012-11-16: qty 1

## 2012-11-16 NOTE — Patient Instructions (Signed)

## 2012-11-30 ENCOUNTER — Ambulatory Visit (HOSPITAL_BASED_OUTPATIENT_CLINIC_OR_DEPARTMENT_OTHER): Payer: BC Managed Care – PPO

## 2012-11-30 VITALS — BP 139/89 | HR 95 | Temp 99.3°F | Resp 20

## 2012-11-30 DIAGNOSIS — E340 Carcinoid syndrome: Secondary | ICD-10-CM

## 2012-11-30 DIAGNOSIS — C7B8 Other secondary neuroendocrine tumors: Secondary | ICD-10-CM

## 2012-11-30 DIAGNOSIS — C7A00 Malignant carcinoid tumor of unspecified site: Secondary | ICD-10-CM

## 2012-11-30 MED ORDER — OCTREOTIDE ACETATE 30 MG IM KIT
30.0000 mg | PACK | Freq: Once | INTRAMUSCULAR | Status: AC
  Administered 2012-11-30: 30 mg via INTRAMUSCULAR

## 2012-11-30 NOTE — Patient Instructions (Signed)

## 2012-12-13 ENCOUNTER — Other Ambulatory Visit: Payer: Self-pay | Admitting: Internal Medicine

## 2012-12-14 ENCOUNTER — Encounter: Payer: Self-pay | Admitting: Family

## 2012-12-14 ENCOUNTER — Ambulatory Visit (INDEPENDENT_AMBULATORY_CARE_PROVIDER_SITE_OTHER): Payer: BC Managed Care – PPO | Admitting: Family

## 2012-12-14 ENCOUNTER — Ambulatory Visit (HOSPITAL_BASED_OUTPATIENT_CLINIC_OR_DEPARTMENT_OTHER): Payer: BC Managed Care – PPO

## 2012-12-14 VITALS — BP 120/88 | HR 86 | Temp 98.4°F | Resp 16 | Ht 59.0 in | Wt 208.0 lb

## 2012-12-14 VITALS — BP 152/92 | HR 87 | Temp 98.7°F | Resp 18

## 2012-12-14 DIAGNOSIS — D869 Sarcoidosis, unspecified: Secondary | ICD-10-CM

## 2012-12-14 DIAGNOSIS — F411 Generalized anxiety disorder: Secondary | ICD-10-CM

## 2012-12-14 DIAGNOSIS — C787 Secondary malignant neoplasm of liver and intrahepatic bile duct: Secondary | ICD-10-CM

## 2012-12-14 DIAGNOSIS — E340 Carcinoid syndrome: Secondary | ICD-10-CM

## 2012-12-14 DIAGNOSIS — I10 Essential (primary) hypertension: Secondary | ICD-10-CM

## 2012-12-14 DIAGNOSIS — C7B8 Other secondary neuroendocrine tumors: Secondary | ICD-10-CM

## 2012-12-14 DIAGNOSIS — C7A00 Malignant carcinoid tumor of unspecified site: Secondary | ICD-10-CM

## 2012-12-14 DIAGNOSIS — K219 Gastro-esophageal reflux disease without esophagitis: Secondary | ICD-10-CM

## 2012-12-14 DIAGNOSIS — F419 Anxiety disorder, unspecified: Secondary | ICD-10-CM

## 2012-12-14 MED ORDER — OCTREOTIDE ACETATE 30 MG IM KIT
PACK | INTRAMUSCULAR | Status: AC
  Filled 2012-12-14: qty 1

## 2012-12-14 MED ORDER — OCTREOTIDE ACETATE 30 MG IM KIT
30.0000 mg | PACK | Freq: Once | INTRAMUSCULAR | Status: AC
  Administered 2012-12-14: 30 mg via INTRAMUSCULAR

## 2012-12-14 NOTE — Assessment & Plan Note (Signed)
Stable, continue current meds 

## 2012-12-14 NOTE — Assessment & Plan Note (Signed)
Stable on PPI, continue same.  

## 2012-12-14 NOTE — Assessment & Plan Note (Signed)
Clinically stable.  Management per onc

## 2012-12-14 NOTE — Assessment & Plan Note (Signed)
Clinically stable. Monitor. 

## 2012-12-14 NOTE — Assessment & Plan Note (Signed)
Stable on effexor.   

## 2012-12-14 NOTE — Patient Instructions (Signed)

## 2012-12-14 NOTE — Progress Notes (Signed)
Subjective:    Patient ID: Leah [name], female    DOB: 11-16-1960, 52 y.o.   MRN: [id number]  HPI  Ms. Leah [name] is a 52 yr old female who presents today for follow up of multiple medical problems:  1) Anxiety- maintained on effexor. Reports she has facial flushing.  Reports that her anxiety remains well controlled.    2) GERD- maintained on protonix. Reports sx well controlled on this dose.  3) sarcoidosis- reports well controlled.  Uses albuterol prn, but has not had recent need.  4) Neuroendrocrine tumor- maintained on sandostatin. Reports that she continues to follow with oncology.    5) HTN-  Currently maintained on amlodipine and diovan    Review of Systems See HPI  Past Medical History  Diagnosis Date  . Sarcoidosis 2008  . Allergic rhinitis   . GE reflux   . Neuroendocrine tumor 2011  . Surgical menopause   . Migraines   . Atrophic vaginitis   . STD (sexually transmitted disease)     HSV I  . Hepatic encephalopathy 2011  . HTN (hypertension) 2011  . Carcinoid syndrome     History   Social History  . Marital Status: Married    Spouse Name: N/A    Number of Children: N/A  . Years of Education: N/A   Occupational History  . Not on file.   Social History Main Topics  . Smoking status: Never Smoker   . Smokeless tobacco: Never Used  . Alcohol Use: No     Comment: about 1x q 3 months  . Drug Use: No  . Sexual Activity: Yes    Birth Control/ Protection: Surgical     Comment: hysterectomy   Other Topics Concern  . Not on file   Social History Narrative   Married to Leah [name]   2 sons- 2001 Leah [name] and  alex (1999)   Has dog   She is unemployed, has worked in the past (office work)   Completed some college   Enjoys Engineer, materials    Past Surgical History  Procedure Laterality Date  . Tonsillectomy  1976  . Cesarean section  [id number]  . Cholecystectomy  2006  . Abdominal hysterectomy  2004  . Oophorectomy  2004  . Mastectomy  2008   bilateral  . Bilateral mastectectomy Bilateral 2008  . Latissimus dorsi graft  2008    used for reconstructive breast surgery  . Hepatetetomy    . Tubal ligation  2001    BTL  . Breast surgery      bilateral masectomy and reconstruction with silicone implants  . Knee arthroscopy Left 09/2010    Family History  Problem Relation Age of Onset  . Arthritis Maternal Grandmother   . Arthritis Maternal Grandfather   . Arthritis Paternal Grandmother   . Arthritis Paternal Grandfather   . Heart failure Paternal Grandfather   . Breast cancer Mother 9    died at 29  . Lupus Mother   . Heart failure Father   . Hypertension Father   . Hyperlipidemia Father   . Breast cancer Maternal Aunt 38    deceased at 77 recurrence  . Ovarian cancer Maternal Aunt 60     died with in 6 months  . Breast cancer Maternal Aunt 40    died at age 19    No Known Allergies  Current Outpatient Prescriptions on File Prior to Visit  Medication Sig Dispense Refill  . albuterol (PROVENTIL HFA;VENTOLIN HFA) 108 (90 BASE)  MCG/ACT inhaler Inhale 2 puffs into the lungs every 6 (six) hours as needed. For shortness of breath.      Marland Kitchen amLODipine (NORVASC) 5 MG tablet TAKE 1 TABLET BY MOUTH DAILY  90 tablet  3  . [facility] ALLERGY RELIEF 180 MG tablet TAKE 1 TABLET BY MOUTH EVERY DAY  30 tablet  10  . Diclofenac Sodium (PENNSAID) 1.5 % SOLN Place 40 drops onto the skin 4 (four) times daily as needed. For joint pain.      . montelukast (SINGULAIR) 10 MG tablet TAKE 1 TABLET AT BEDTIME  90 tablet  2  . naproxen (NAPROSYN) 500 MG tablet Take 1 tablet by mouth 2 (two) times daily.      Marland Kitchen octreotide (SANDOSTATIN LAR) 30 MG injection Inject 30 mg into the muscle every 14 (fourteen) days.      . ondansetron (ZOFRAN) 4 MG tablet Take 4 mg by mouth every 8 (eight) hours as needed. For nausea.      . Ospemifene (OSPHENA) 60 MG TABS Take 1 tablet by mouth daily.  90 tablet  3  . pantoprazole (PROTONIX) 40 MG tablet Take 40 mg by mouth  2 (two) times daily.      . valsartan (DIOVAN) 160 MG tablet Take 1 tablet (160 mg total) by mouth daily.  90 tablet  3  . venlafaxine XR (EFFEXOR-XR) 150 MG 24 hr capsule Take 1 capsule (150 mg total) by mouth daily.  90 capsule  3   No current facility-administered medications on file prior to visit.    BP 120/88  Pulse 86  Temp(Src) 98.4 F (36.9 C) (Oral)  Resp 16  Ht 4\' 11"  (1.499 m)  Wt 208 lb (94.348 kg)  BMI 41.99 kg/m2  SpO2 99%       Objective:   Physical Exam  Constitutional: She is oriented to person, place, and time. She appears well-developed and well-nourished. No distress.  HENT:  Head: Normocephalic and atraumatic.  Cardiovascular: Normal rate and regular rhythm.   No murmur heard. Pulmonary/Chest: Effort normal and breath sounds normal. No respiratory distress. She has no wheezes. She has no rales. She exhibits no tenderness.  Neurological: She is alert and oriented to person, place, and time.  Psychiatric: She has a normal mood and affect. Her behavior is normal. Judgment and thought content normal.          Assessment & Plan:

## 2012-12-14 NOTE — Patient Instructions (Signed)
Please follow up in 3 months for a fasting physical.

## 2012-12-16 ENCOUNTER — Other Ambulatory Visit: Payer: Self-pay

## 2012-12-28 ENCOUNTER — Ambulatory Visit (HOSPITAL_BASED_OUTPATIENT_CLINIC_OR_DEPARTMENT_OTHER): Payer: BC Managed Care – PPO

## 2012-12-28 VITALS — BP 140/92 | HR 110 | Temp 98.1°F | Resp 18

## 2012-12-28 DIAGNOSIS — C7A00 Malignant carcinoid tumor of unspecified site: Secondary | ICD-10-CM

## 2012-12-28 DIAGNOSIS — C7B8 Other secondary neuroendocrine tumors: Secondary | ICD-10-CM

## 2012-12-28 DIAGNOSIS — E340 Carcinoid syndrome: Secondary | ICD-10-CM

## 2012-12-28 DIAGNOSIS — C787 Secondary malignant neoplasm of liver and intrahepatic bile duct: Secondary | ICD-10-CM

## 2012-12-28 MED ORDER — OCTREOTIDE ACETATE 30 MG IM KIT
PACK | INTRAMUSCULAR | Status: AC
  Filled 2012-12-28: qty 1

## 2012-12-28 MED ORDER — OCTREOTIDE ACETATE 30 MG IM KIT
30.0000 mg | PACK | Freq: Once | INTRAMUSCULAR | Status: AC
  Administered 2012-12-28: 30 mg via INTRAMUSCULAR

## 2012-12-28 NOTE — Patient Instructions (Signed)

## 2013-01-04 ENCOUNTER — Ambulatory Visit: Payer: BC Managed Care – PPO

## 2013-01-04 ENCOUNTER — Other Ambulatory Visit: Payer: BC Managed Care – PPO | Admitting: Lab

## 2013-01-04 ENCOUNTER — Ambulatory Visit: Payer: BC Managed Care – PPO | Admitting: Hematology & Oncology

## 2013-01-09 NOTE — Patient Instructions (Signed)
Continue same medications and return in 6 months 

## 2013-01-09 NOTE — Progress Notes (Signed)
Subjective:    Patient ID: Leah [name], female    DOB: 09-19-1960, 51 y.o.   MRN: [id number]  HPI 52 year old female with history of sarcoidosis, allergic rhinitis, GE reflux, hypertension. She is followed by Dr. 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 She receives Sandostatin every 3 weeks. She had a partial hepatectomy at [facility] and has done well. She underwent bilateral mastectomies because of strong family history of breast cancer in her mother and sister. Thus she had bilateral mastectomies with bilateral breast reconstruction in January 2008 4 she moved to [place].  Patient has history of tonsillectomy 1976, C-section 1999 in 2001, cholecystectomy 2003, hysterectomy and oophorectomy 2004. Patient had Pap smear in 2011. GYN care is done by Rock Nephew, nurse practitioner.  History of osteoarthritis left knee. Dr. Valentina Gu has told her that she needs left knee replacement but says that she needs to lose weight. Says she has torn cartilage in left knee. Says she's been doing Weight Watchers and trying to exercise [employer] but has not lost weight. Dr. Corliss Skains also sees her for osteoarthritis in her feet.  Social history: Patient is married has a Naval architect. She is a Futures trader. Does not smoke or consume alcohol. She has 2 sons in good health.  Dr. Elnoria Howard has also seen her for carcinoid syndrome and she was treated in 2010 with octreotide for diarrhea.  Family history: Father died at 2 of an MI. Mother died at 5 with breast cancer with history of lupus. One brother in good health.    Review of Systems  Constitutional: Positive for fever.       Complains of inability to lose weight       Objective:   Physical Exam  Vitals reviewed. Constitutional: She is oriented to person, place, and time. She appears well-developed and well-nourished. No distress.  HENT:  Head: Normocephalic and atraumatic.  Right Ear:  External ear normal.  Left Ear: External ear normal.  Mouth/Throat: Oropharynx is clear and moist.  Eyes: Conjunctivae and EOM are normal. Pupils are equal, round, and reactive to light. Right eye exhibits no discharge. Left eye exhibits no discharge. No scleral icterus.  Neck: Neck supple. No JVD present. No thyromegaly present.  Cardiovascular: Normal rate, regular rhythm, normal heart sounds and intact distal pulses.   No murmur heard. Pulmonary/Chest: Effort normal and breath sounds normal. She has no wheezes. She has no rales.  Bilateral mastectomies with reconstruction without masses  Abdominal: Soft. Bowel sounds are normal. She exhibits no mass. There is no tenderness. There is no rebound and no guarding.  Genitourinary:  Deferred to GYN  Musculoskeletal: Normal range of motion. She exhibits no edema.  Lymphadenopathy:    She has no cervical adenopathy.  Neurological: She is alert and oriented to person, place, and time. No cranial nerve deficit. Coordination normal.  Skin: Skin is warm and dry. No rash noted. She is not diaphoretic.  Psychiatric: She has a normal mood and affect. Her behavior is normal. Judgment and thought content normal.          Assessment & Plan:  History of metastatic carcinoid tumor of pancreas with liver metastasis treated by Dr. Rosemarie Beath was Sandostatin status post partial hepatectomy at [facility]  Hypertension-stable  History of prophylactic bilateral mastectomies because of family history  Allergic rhinitis  Sarcoidosis-remote history and not an issue at the present time  Osteoarthritis of feet and left knee  Anxiety  History of  GE reflux  Plan: Patient frustrated she is not able to lose weight. Needs to find some sort of exercise she can do but says left knee pain prevents her from exercising. Needs to watch calorie intake.  Plan: Return in 6 months. Continue same medications.

## 2013-01-10 ENCOUNTER — Ambulatory Visit (HOSPITAL_BASED_OUTPATIENT_CLINIC_OR_DEPARTMENT_OTHER): Payer: BC Managed Care – PPO

## 2013-01-10 ENCOUNTER — Other Ambulatory Visit (HOSPITAL_BASED_OUTPATIENT_CLINIC_OR_DEPARTMENT_OTHER): Payer: BC Managed Care – PPO | Admitting: Lab

## 2013-01-10 ENCOUNTER — Ambulatory Visit (HOSPITAL_BASED_OUTPATIENT_CLINIC_OR_DEPARTMENT_OTHER): Payer: BC Managed Care – PPO | Admitting: Hematology & Oncology

## 2013-01-10 VITALS — BP 141/82 | HR 88 | Temp 98.1°F | Resp 14 | Ht 59.0 in | Wt 209.0 lb

## 2013-01-10 DIAGNOSIS — C7A00 Malignant carcinoid tumor of unspecified site: Secondary | ICD-10-CM

## 2013-01-10 DIAGNOSIS — E3400 Carcinoid syndrome, unspecified: Secondary | ICD-10-CM

## 2013-01-10 DIAGNOSIS — C7B8 Other secondary neuroendocrine tumors: Secondary | ICD-10-CM

## 2013-01-10 DIAGNOSIS — C787 Secondary malignant neoplasm of liver and intrahepatic bile duct: Secondary | ICD-10-CM

## 2013-01-10 LAB — CMP (CANCER CENTER ONLY)
AST: 14 U/L (ref 11–38)
Albumin: 3.4 g/dL (ref 3.3–5.5)
Alkaline Phosphatase: 68 U/L (ref 26–84)
BUN, Bld: 16 mg/dL (ref 7–22)
Potassium: 3.7 mEq/L (ref 3.3–4.7)
Total Bilirubin: 0.7 mg/dl (ref 0.20–1.60)

## 2013-01-10 LAB — CBC WITH DIFFERENTIAL (CANCER CENTER ONLY)
BASO#: 0 10*3/uL (ref 0.0–0.2)
BASO%: 0.3 % (ref 0.0–2.0)
EOS%: 2.2 % (ref 0.0–7.0)
HCT: 37.2 % (ref 34.8–46.6)
LYMPH#: 3.1 10*3/uL (ref 0.9–3.3)
MCHC: 32.5 g/dL (ref 32.0–36.0)
MONO#: 0.7 10*3/uL (ref 0.1–0.9)
MONO%: 6.1 % (ref 0.0–13.0)
NEUT#: 6.9 10*3/uL — ABNORMAL HIGH (ref 1.5–6.5)
NEUT%: 62.7 % (ref 39.6–80.0)
RDW: 13.3 % (ref 11.1–15.7)
WBC: 10.9 10*3/uL — ABNORMAL HIGH (ref 3.9–10.0)

## 2013-01-10 MED ORDER — OCTREOTIDE ACETATE 30 MG IM KIT
PACK | INTRAMUSCULAR | Status: AC
  Filled 2013-01-10: qty 1

## 2013-01-10 MED ORDER — OCTREOTIDE ACETATE 30 MG IM KIT
30.0000 mg | PACK | Freq: Once | INTRAMUSCULAR | Status: AC
  Administered 2013-01-10: 30 mg via INTRAMUSCULAR

## 2013-01-10 NOTE — Progress Notes (Signed)
This office note has been dictated.

## 2013-01-16 NOTE — Progress Notes (Signed)
DIAGNOSIS:  Metastatic neuroendocrine carcinoma.  CURRENT THERAPY:  Sandostatin LAR 30 mg IM q.2 weeks.  INTERIM HISTORY:  Leah [name] comes in for followup.  She is doing okay. She and her family got back from [place].  They were up there for Thanksgiving.  They had a good time.  Their trip back however was miserable.  It took them 12 hours instead of 9 hours.  She has done well with the every 2 weeks Sandostatin.  This seemed to work well for her.  She has very little in the way of symptoms with every 2 weeks Sandostatin.  When we last checked her chromogranin A, back in August, it was 4.8.  She has had no fever.  She has had no diarrhea.  She has had no leg swelling.  She is trying to lose a little weight.  Unfortunately, she has gained quite a bit of weight because of the holidays.  PHYSICAL EXAMINATION:  General:  This is a somewhat obese white female, in no obvious distress.  Vital Signs:  Temperature of 98.1, pulse 88, respiratory rate 14, blood pressure 141/82.  Weight is 209 pounds.  Head and Neck:  Normocephalic, atraumatic skull.  There are no ocular or oral lesions.  There are no palpable cervical or supraclavicular lymph nodes. Lungs:  Clear bilaterally.  Cardiac:  Regular rate and rhythm with a normal S1, S2.  There are no murmurs, rubs, or bruits.  Abdomen:  Soft. She has well-healed laparotomy scars.  There is no fluid wave.  There is no palpable abdominal mass.  There is no palpable hepatosplenomegaly. Back:  No tenderness over the spine, ribs, or hips.  Extremities:  No clubbing, cyanosis, or edema.  Skin:  No rash, ecchymosis, or petechia. Neurologic:  No focal neurological deficits.  LABORATORY STUDIES:  White cell count is 10.9, hemoglobin 12.1, hematocrit 37.2, platelet count 222.  Liver function tests are normal.  IMPRESSION:  Ms. Loos is a very charming, 52 year old white female with metastatic neuroendocrine carcinoma.  She has had surgery for  resection.  She has been pretty much asymptomatic.  Her chromogranin A level has been normal.  We will go ahead and continue to follow her along supportively.  I do not see a need to do any scans on her as long as she is not having any kind of symptoms.  We will go ahead and plan to see her back ourselves probably in about 2 months or so.  She will continue Sandostatin every 2 weeks.    ______________________________ Josph Macho, M.D. PRE/MEDQ  D:  01/10/2013  T:  01/15/2013  Job:  [id number]

## 2013-01-18 ENCOUNTER — Ambulatory Visit (INDEPENDENT_AMBULATORY_CARE_PROVIDER_SITE_OTHER): Payer: BC Managed Care – PPO | Admitting: Family

## 2013-01-18 ENCOUNTER — Encounter: Payer: Self-pay | Admitting: Family

## 2013-01-18 VITALS — BP 126/80 | HR 93 | Temp 97.7°F | Resp 18 | Ht 59.0 in | Wt 209.0 lb

## 2013-01-18 DIAGNOSIS — I10 Essential (primary) hypertension: Secondary | ICD-10-CM

## 2013-01-18 DIAGNOSIS — H3500 Unspecified background retinopathy: Secondary | ICD-10-CM | POA: Insufficient documentation

## 2013-01-18 MED ORDER — AMLODIPINE BESYLATE 10 MG PO TABS: 10.0000 mg | ORAL_TABLET | Freq: Every day | ORAL | Status: DC

## 2013-01-18 NOTE — Assessment & Plan Note (Signed)
Given recent finding of retinopathy and home readings >140, will try to tighten BP control a bit more.  Increase amlodipine form 5mg  to 10mg . Plan follow up in 1 month.

## 2013-01-18 NOTE — Assessment & Plan Note (Addendum)
New. Will refer to opthalmology for futher evaluation.

## 2013-01-18 NOTE — Patient Instructions (Signed)
You will be contacted about your referral to opthalmology. Increase amlodipine from 5mg  to 10mg . Follow up in 1 month.

## 2013-01-18 NOTE — Progress Notes (Signed)
Pre visit review using our clinic review tool, if applicable. No additional management support is needed unless otherwise documented below in the visit note. 

## 2013-01-18 NOTE — Progress Notes (Signed)
Subjective:    Patient ID: Leah [name], female    DOB: 10/03/1960, 52 y.o.   MRN: [id number]  HPI  Leah [name] is a 52 yr old female who presents today for follow up of multiple medical problems.  1) Eye Problem- reports that she recently went to  Optometrist and had dilated eye exam. Was told that she has retinal hemorrhages. Was told to come see [place] for further evaluation.   2) HTN-  Currently reports that at home she generally runs in the 140's systolic.  BP Readings from Last 3 Encounters:  01/18/13 126/80  01/10/13 141/82  12/28/12 140/92    Review of Systems See HPI  Past Medical History  Diagnosis Date  . Sarcoidosis 2008  . Allergic rhinitis   . GE reflux   . Neuroendocrine tumor 2011  . Surgical menopause   . Migraines   . Atrophic vaginitis   . STD (sexually transmitted disease)     HSV I  . Hepatic encephalopathy 2011  . HTN (hypertension) 2011  . Carcinoid syndrome     History   Social History  . Marital Status: Married    Spouse Name: N/A    Number of Children: N/A  . Years of Education: N/A   Occupational History  . Not on file.   Social History Main Topics  . Smoking status: Never Smoker   . Smokeless tobacco: Never Used  . Alcohol Use: No     Comment: about 1x q 3 months  . Drug Use: No  . Sexual Activity: Yes    Birth Control/ Protection: Surgical     Comment: hysterectomy   Other Topics Concern  . Not on file   Social History Narrative   Married to [place]   2 sons- 2001 [name] and  alex (1999)   Has dog   She is unemployed, has worked in the past (office work)   Completed some college   Enjoys Engineer, materials    Past Surgical History  Procedure Laterality Date  . Tonsillectomy  1976  . Cesarean section  [id number]  . Cholecystectomy  2006  . Abdominal hysterectomy  2004  . Oophorectomy  2004  . Mastectomy  2008    bilateral  . Bilateral mastectectomy Bilateral 2008  . Latissimus dorsi graft  2008    used for  reconstructive breast surgery  . Hepatetetomy    . Tubal ligation  2001    BTL  . Breast surgery      bilateral masectomy and reconstruction with silicone implants  . Knee arthroscopy Left 09/2010    Family History  Problem Relation Age of Onset  . Arthritis Maternal Grandmother   . Arthritis Maternal Grandfather   . Arthritis Paternal Grandmother   . Arthritis Paternal Grandfather   . Heart failure Paternal Grandfather   . Breast cancer Mother 100    died at 60  . Lupus Mother   . Heart failure Father   . Hypertension Father   . Hyperlipidemia Father   . Breast cancer Maternal Aunt 38    deceased at 35 recurrence  . Ovarian cancer Maternal Aunt 60     died with in 6 months  . Breast cancer Maternal Aunt 40    died at age 39    No Known Allergies  Current Outpatient Prescriptions on File Prior to Visit  Medication Sig Dispense Refill  . albuterol (PROVENTIL HFA;VENTOLIN HFA) 108 (90 BASE) MCG/ACT inhaler Inhale 2 puffs into the lungs  every 6 (six) hours as needed. For shortness of breath.      . [employer] ALLERGY RELIEF 180 MG tablet TAKE 1 TABLET BY MOUTH EVERY DAY  30 tablet  10  . Diclofenac Sodium (PENNSAID) 1.5 % SOLN Place 40 drops onto the skin 4 (four) times daily as needed. For joint pain.      . montelukast (SINGULAIR) 10 MG tablet TAKE 1 TABLET AT BEDTIME  90 tablet  2  . naproxen (NAPROSYN) 500 MG tablet Take 1 tablet by mouth 2 (two) times daily.      Marland Kitchen octreotide (SANDOSTATIN LAR) 30 MG injection Inject 30 mg into the muscle every 14 (fourteen) days.      . ondansetron (ZOFRAN) 4 MG tablet Take 4 mg by mouth every 8 (eight) hours as needed. For nausea.      . Ospemifene (OSPHENA) 60 MG TABS Take 1 tablet by mouth daily.  90 tablet  3  . pantoprazole (PROTONIX) 40 MG tablet Take 40 mg by mouth 2 (two) times daily.      Marland Kitchen triamcinolone cream (KENALOG) 0.1 % APPLY ON RASH THREE TIMES A DAY as needed      . valACYclovir (VALTREX) 500 MG tablet TAKE 1 TABLET TWO TIMES A  DAY as needed.      . valsartan (DIOVAN) 160 MG tablet Take 1 tablet (160 mg total) by mouth daily.  90 tablet  3  . venlafaxine XR (EFFEXOR-XR) 150 MG 24 hr capsule Take 1 capsule (150 mg total) by mouth daily.  90 capsule  3   No current facility-administered medications on file prior to visit.    BP 126/80  Pulse 93  Temp(Src) 97.7 F (36.5 C) (Oral)  Resp 18  Ht 4\' 11"  (1.499 m)  Wt 209 lb 0.6 oz (94.82 kg)  BMI 42.20 kg/m2  SpO2 97%       Objective:   Physical Exam  Constitutional: She appears well-developed and well-nourished. No distress.  Cardiovascular: Normal rate and regular rhythm.   No murmur heard. Pulmonary/Chest: Effort normal and breath sounds normal. No respiratory distress. She has no wheezes. She has no rales. She exhibits no tenderness.  Musculoskeletal: She exhibits no edema.  Psychiatric: She has a normal mood and affect. Her behavior is normal. Judgment and thought content normal.          Assessment & Plan:

## 2013-01-25 ENCOUNTER — Ambulatory Visit (HOSPITAL_BASED_OUTPATIENT_CLINIC_OR_DEPARTMENT_OTHER): Payer: BC Managed Care – PPO

## 2013-01-25 DIAGNOSIS — C7A00 Malignant carcinoid tumor of unspecified site: Secondary | ICD-10-CM

## 2013-01-25 DIAGNOSIS — C787 Secondary malignant neoplasm of liver and intrahepatic bile duct: Secondary | ICD-10-CM

## 2013-01-25 DIAGNOSIS — C7B8 Other secondary neuroendocrine tumors: Secondary | ICD-10-CM

## 2013-01-25 MED ORDER — OCTREOTIDE ACETATE 30 MG IM KIT
30.0000 mg | PACK | Freq: Once | INTRAMUSCULAR | Status: AC
  Administered 2013-01-25: 30 mg via INTRAMUSCULAR

## 2013-01-25 MED ORDER — OCTREOTIDE ACETATE 30 MG IM KIT
PACK | INTRAMUSCULAR | Status: AC
  Filled 2013-01-25: qty 1

## 2013-01-25 NOTE — Patient Instructions (Signed)

## 2013-02-08 ENCOUNTER — Ambulatory Visit (HOSPITAL_BASED_OUTPATIENT_CLINIC_OR_DEPARTMENT_OTHER): Payer: BC Managed Care – PPO

## 2013-02-08 DIAGNOSIS — C7A00 Malignant carcinoid tumor of unspecified site: Secondary | ICD-10-CM

## 2013-02-08 DIAGNOSIS — E3400 Carcinoid syndrome, unspecified: Secondary | ICD-10-CM

## 2013-02-08 DIAGNOSIS — C787 Secondary malignant neoplasm of liver and intrahepatic bile duct: Secondary | ICD-10-CM

## 2013-02-08 DIAGNOSIS — C7B8 Other secondary neuroendocrine tumors: Secondary | ICD-10-CM

## 2013-02-08 MED ORDER — OCTREOTIDE ACETATE 30 MG IM KIT
30.0000 mg | PACK | Freq: Once | INTRAMUSCULAR | Status: AC
  Administered 2013-02-08: 30 mg via INTRAMUSCULAR

## 2013-02-08 NOTE — Patient Instructions (Signed)

## 2013-02-21 DIAGNOSIS — H348392 Tributary (branch) retinal vein occlusion, unspecified eye, stable: Secondary | ICD-10-CM | POA: Insufficient documentation

## 2013-02-21 DIAGNOSIS — E340 Carcinoid syndrome: Secondary | ICD-10-CM | POA: Insufficient documentation

## 2013-02-22 ENCOUNTER — Ambulatory Visit (HOSPITAL_BASED_OUTPATIENT_CLINIC_OR_DEPARTMENT_OTHER): Payer: BC Managed Care – PPO

## 2013-02-22 VITALS — BP 140/78 | HR 88 | Temp 97.6°F | Resp 16

## 2013-02-22 DIAGNOSIS — C7B8 Other secondary neuroendocrine tumors: Secondary | ICD-10-CM

## 2013-02-22 DIAGNOSIS — C787 Secondary malignant neoplasm of liver and intrahepatic bile duct: Secondary | ICD-10-CM

## 2013-02-22 DIAGNOSIS — E3400 Carcinoid syndrome, unspecified: Secondary | ICD-10-CM

## 2013-02-22 DIAGNOSIS — C7A00 Malignant carcinoid tumor of unspecified site: Secondary | ICD-10-CM

## 2013-02-22 MED ORDER — OCTREOTIDE ACETATE 30 MG IM KIT
30.0000 mg | PACK | Freq: Once | INTRAMUSCULAR | Status: AC
  Administered 2013-02-22: 30 mg via INTRAMUSCULAR

## 2013-02-22 NOTE — Patient Instructions (Signed)

## 2013-03-01 ENCOUNTER — Encounter: Payer: Self-pay | Admitting: Family

## 2013-03-01 ENCOUNTER — Ambulatory Visit (INDEPENDENT_AMBULATORY_CARE_PROVIDER_SITE_OTHER): Payer: BC Managed Care – PPO | Admitting: Family

## 2013-03-01 VITALS — HR 80 | Temp 98.2°F | Resp 16 | Ht 59.0 in | Wt 213.0 lb

## 2013-03-01 DIAGNOSIS — I10 Essential (primary) hypertension: Secondary | ICD-10-CM

## 2013-03-01 MED ORDER — HYDROCHLOROTHIAZIDE 25 MG PO TABS: 25.0000 mg | ORAL_TABLET | Freq: Every day | ORAL | Status: DC

## 2013-03-01 NOTE — Assessment & Plan Note (Signed)
BP still up a bit. Will continue current dose of amlodipine, diovan and add hctz.  Follow up in 2 weeks for nurse visit and BP check.

## 2013-03-01 NOTE — Assessment & Plan Note (Signed)
We discussed continuation of exercise and monitoring portions. She plans to start weight watchers.  Tells me her insurance will not cover nutrition referral.

## 2013-03-01 NOTE — Patient Instructions (Signed)
Start HCTZ, follow up in 2 weeks.

## 2013-03-01 NOTE — Progress Notes (Signed)
Subjective:    Patient ID: Leah [name], female    DOB: December 12, 1960, 53 y.o.   MRN: [id number]  HPI  Leah [name] is a 53 yr old female who presents today for follow up of hypertension.Last visit her amlodipine was increased from 5mg  to 10mg .  Denies swelling, CP, SOB.  Reports sbp still running in the 140's at home BP Readings from Last 3 Encounters:  02/22/13 140/78  01/18/13 126/80  01/10/13 141/82   Obesity- she is concerned about her weight. Body mass index is 43.01 kg/(m^2). She does water aerobics 3x a week.  Knee bothers her out of the pool.    Review of Systems See HPI  Past Medical History  Diagnosis Date  . Sarcoidosis 2008  . Allergic rhinitis   . GE reflux   . Neuroendocrine tumor 2011  . Surgical menopause   . Migraines   . Atrophic vaginitis   . STD (sexually transmitted disease)     HSV I  . Hepatic encephalopathy 2011  . HTN (hypertension) 2011  . Carcinoid syndrome     History   Social History  . Marital Status: Married    Spouse Name: N/A    Number of Children: N/A  . Years of Education: N/A   Occupational History  . Not on file.   Social History Main Topics  . Smoking status: Never Smoker   . Smokeless tobacco: Never Used  . Alcohol Use: No     Comment: about 1x q 3 months  . Drug Use: No  . Sexual Activity: Yes    Birth Control/ Protection: Surgical     Comment: hysterectomy   Other Topics Concern  . Not on file   Social History Narrative   Married to [place]   2 sons- 2001 [name] (1999)   Has dog   She is unemployed, has worked in the past (office work)   Completed some college   Enjoys Theatre manager    Past Surgical History  Procedure Laterality Date  . Tonsillectomy  1976  . Cesarean section  [id number]  . Cholecystectomy  2006  . Abdominal hysterectomy  2004  . Oophorectomy  2004  . Mastectomy  2008    bilateral  . Bilateral mastectectomy Bilateral 2008  . Latissimus dorsi graft  2008    used for  reconstructive breast surgery  . Hepatetetomy    . Tubal ligation  2001    BTL  . Breast surgery      bilateral masectomy and reconstruction with silicone implants  . Knee arthroscopy Left 09/2010    Family History  Problem Relation Age of Onset  . Arthritis Maternal Grandmother   . Arthritis Maternal Grandfather   . Arthritis Paternal Grandmother   . Arthritis Paternal Grandfather   . Heart failure Paternal Grandfather   . Breast cancer Mother 71    died at 71  . Lupus Mother   . Heart failure Father   . Hypertension Father   . Hyperlipidemia Father   . Breast cancer Maternal Aunt 38    deceased at 69 recurrence  . Ovarian cancer Maternal Aunt 105     died with in 45 months  . Breast cancer Maternal Aunt 40    died at age 23    No Known Allergies  Current Outpatient Prescriptions on File Prior to Visit  Medication Sig Dispense Refill  . albuterol (PROVENTIL HFA;VENTOLIN HFA) 108 (90 BASE) MCG/ACT inhaler Inhale 2 puffs into the  lungs every 6 (six) hours as needed. For shortness of breath.      Marland Kitchen amLODipine (NORVASC) 10 MG tablet Take 1 tablet (10 mg total) by mouth daily.  30 tablet  1  . [facility]S ALLERGY RELIEF 180 MG tablet TAKE 1 TABLET BY MOUTH EVERY DAY  30 tablet  10  . Diclofenac Sodium (PENNSAID) 1.5 % SOLN Place 40 drops onto the skin 4 (four) times daily as needed. For joint pain.      . montelukast (SINGULAIR) 10 MG tablet TAKE 1 TABLET AT BEDTIME  90 tablet  2  . naproxen (NAPROSYN) 500 MG tablet Take 1 tablet by mouth 2 (two) times daily.      Marland Kitchen octreotide (SANDOSTATIN LAR) 30 MG injection Inject 30 mg into the muscle every 14 (fourteen) days.      . ondansetron (ZOFRAN) 4 MG tablet Take 4 mg by mouth every 8 (eight) hours as needed. For nausea.      . Ospemifene (OSPHENA) 60 MG TABS Take 1 tablet by mouth daily.  90 tablet  3  . pantoprazole (PROTONIX) 40 MG tablet Take 40 mg by mouth 2 (two) times daily.      Marland Kitchen triamcinolone cream (KENALOG) 0.1 % APPLY ON RASH  THREE TIMES A DAY as needed      . valACYclovir (VALTREX) 500 MG tablet TAKE 1 TABLET TWO TIMES A DAY as needed.      . valsartan (DIOVAN) 160 MG tablet Take 1 tablet (160 mg total) by mouth daily.  90 tablet  3  . venlafaxine XR (EFFEXOR-XR) 150 MG 24 hr capsule Take 1 capsule (150 mg total) by mouth daily.  90 capsule  3   No current facility-administered medications on file prior to visit.    Pulse 80  Temp(Src) 98.2 F (36.8 C) (Oral)  Resp 16  Ht 4\' 11"  (1.499 m)  Wt 213 lb 0.6 oz (96.634 kg)  BMI 43.01 kg/m2       Objective:   Physical Exam  Constitutional: She is oriented to person, place, and time. She appears well-developed and well-nourished. No distress.  Cardiovascular: Normal rate and regular rhythm.   No murmur heard. Pulmonary/Chest: Effort normal and breath sounds normal. No respiratory distress. She has no wheezes. She has no rales. She exhibits no tenderness.  Musculoskeletal: She exhibits no edema.  Neurological: She is alert and oriented to person, place, and time.  Psychiatric: She has a normal mood and affect. Her behavior is normal. Judgment and thought content normal.          Assessment & Plan:

## 2013-03-08 ENCOUNTER — Ambulatory Visit (HOSPITAL_BASED_OUTPATIENT_CLINIC_OR_DEPARTMENT_OTHER): Payer: BC Managed Care – PPO

## 2013-03-08 ENCOUNTER — Ambulatory Visit (HOSPITAL_BASED_OUTPATIENT_CLINIC_OR_DEPARTMENT_OTHER): Payer: BC Managed Care – PPO | Admitting: Hematology & Oncology

## 2013-03-08 ENCOUNTER — Other Ambulatory Visit (HOSPITAL_BASED_OUTPATIENT_CLINIC_OR_DEPARTMENT_OTHER): Payer: BC Managed Care – PPO | Admitting: Lab

## 2013-03-08 VITALS — BP 151/87 | HR 97 | Temp 98.1°F | Resp 14 | Ht 64.0 in | Wt 214.0 lb

## 2013-03-08 DIAGNOSIS — E3400 Carcinoid syndrome, unspecified: Secondary | ICD-10-CM

## 2013-03-08 DIAGNOSIS — C787 Secondary malignant neoplasm of liver and intrahepatic bile duct: Secondary | ICD-10-CM

## 2013-03-08 DIAGNOSIS — C7B8 Other secondary neuroendocrine tumors: Secondary | ICD-10-CM

## 2013-03-08 DIAGNOSIS — C7A00 Malignant carcinoid tumor of unspecified site: Secondary | ICD-10-CM

## 2013-03-08 LAB — CBC WITH DIFFERENTIAL (CANCER CENTER ONLY)
BASO#: 0 10*3/uL (ref 0.0–0.2)
BASO%: 0.2 % (ref 0.0–2.0)
EOS ABS: 0.2 10*3/uL (ref 0.0–0.5)
EOS%: 1.8 % (ref 0.0–7.0)
HCT: 37 % (ref 34.8–46.6)
HGB: 12.2 g/dL (ref 11.6–15.9)
LYMPH#: 3.2 10*3/uL (ref 0.9–3.3)
LYMPH%: 35.9 % (ref 14.0–48.0)
MCH: 29.9 pg (ref 26.0–34.0)
MCHC: 33 g/dL (ref 32.0–36.0)
MCV: 91 fL (ref 81–101)
MONO#: 0.6 10*3/uL (ref 0.1–0.9)
MONO%: 7 % (ref 0.0–13.0)
NEUT%: 55.1 % (ref 39.6–80.0)
NEUTROS ABS: 4.9 10*3/uL (ref 1.5–6.5)
PLATELETS: 253 10*3/uL (ref 145–400)
RBC: 4.08 10*6/uL (ref 3.70–5.32)
RDW: 13.7 % (ref 11.1–15.7)
WBC: 9 10*3/uL (ref 3.9–10.0)

## 2013-03-08 LAB — CMP (CANCER CENTER ONLY)
ALBUMIN: 3.2 g/dL — AB (ref 3.3–5.5)
ALT: 21 U/L (ref 10–47)
AST: 18 U/L (ref 11–38)
Alkaline Phosphatase: 66 U/L (ref 26–84)
BUN, Bld: 17 mg/dL (ref 7–22)
CALCIUM: 9 mg/dL (ref 8.0–10.3)
CO2: 29 mEq/L (ref 18–33)
CREATININE: 0.6 mg/dL (ref 0.6–1.2)
Chloride: 103 mEq/L (ref 98–108)
Glucose, Bld: 123 mg/dL — ABNORMAL HIGH (ref 73–118)
POTASSIUM: 3.8 meq/L (ref 3.3–4.7)
Sodium: 138 mEq/L (ref 128–145)
Total Bilirubin: 0.6 mg/dl (ref 0.20–1.60)
Total Protein: 6.9 g/dL (ref 6.4–8.1)

## 2013-03-08 MED ORDER — OCTREOTIDE ACETATE 30 MG IM KIT
PACK | INTRAMUSCULAR | Status: AC
  Filled 2013-03-08: qty 1

## 2013-03-08 MED ORDER — OCTREOTIDE ACETATE 30 MG IM KIT
30.0000 mg | PACK | Freq: Once | INTRAMUSCULAR | Status: AC
  Administered 2013-03-08: 30 mg via INTRAMUSCULAR

## 2013-03-08 NOTE — Progress Notes (Signed)
This office note has been dictated.

## 2013-03-08 NOTE — Patient Instructions (Signed)

## 2013-03-09 NOTE — Progress Notes (Signed)
DIAGNOSIS:  Metastatic neuroendocrine carcinoma.  CURRENT THERAPY:  Sandostatin LAR 30 mg IM q.2 weeks.  INTERIM HISTORY:  Leah [name] comes in for followup.  She is doing pretty well.  We last saw her back in early December.  She has had no real problems since we saw her.  She does have bit of a cold right now.  She had no well problems with fever.  There has been no cough.  She has had no nausea or vomiting.  She has problems of the right eye.  There is some retinal bleeding.  She is taking Avastin shots.  She goes to [facility] for this.  She has high blood pressure issues.  She is on blood pressure medications for this.  She is on Norvasc, Diovan and hydrochlorothiazide.  As far as the neuroendocrine carcinoma is concerned, her chromogranin A level back in December was 3.4.  She has had no change in bowel or bladder habits.  There has been no rashes.  She has had no itching.  There has been no flushing.  PHYSICAL EXAMINATION:  General:  This is a well-developed, well- nourished white female who is somewhat obese.  Vital Signs:  Temperature of 98.1, pulse 101, respiratory rate 14, blood pressure 151/87, weight is 214 pounds.  Head and Neck:  Normocephalic, atraumatic skull.  There are no ocular or oral lesions.  There are no palpable cervical or supraclavicular lymph nodes.  Lungs:  Clear bilaterally.  Cardiac: Regular rate and rhythm with a normal S1, S2.  There are no murmurs, rubs, or bruits.  Abdomen:  Soft.  She has multiple laparotomy scars. There is no fluid wave.  There is no palpable hepatosplenomegaly. Extremities:  No clubbing, cyanosis, or edema.  Skin:  No rashes, ecchymosis, or petechia.  Neurologic:  No focal neurological deficits.  LABORATORY STUDIES:  White blood cell count is 9, hemoglobin 12.2, hematocrit 37, platelet count 253.  IMPRESSION:  Leah [name] is a very charming 53 year old white female. She has metastatic neuroendocrine carcinoma.  This  probably synonymous with carcinoid.  She is doing well with this from my point of view.  She has not had any scans for a while.  Again, she has been pretty much asymptomatic with this.  We will continue to do the Sandostatin every 2 weeks.  This really helps her with the symptoms.  I think losing weight certainly will help her out.  She is on diet now. I will plan to see her back myself in another couple of months.    ______________________________ [name], M.D. PRE/MEDQ  D:  03/08/2013  T:  03/09/2013  Job:  [id number]

## 2013-03-12 LAB — CHROMOGRANIN A: Chromogranin A: 7 ng/mL (ref 1.9–15.0)

## 2013-03-12 LAB — LACTATE DEHYDROGENASE: LDH: 155 U/L (ref 94–250)

## 2013-03-14 ENCOUNTER — Encounter: Payer: Self-pay | Admitting: Family

## 2013-03-14 ENCOUNTER — Ambulatory Visit (INDEPENDENT_AMBULATORY_CARE_PROVIDER_SITE_OTHER): Payer: BC Managed Care – PPO | Admitting: Family

## 2013-03-14 VITALS — BP 118/88 | HR 115 | Temp 98.4°F | Resp 16 | Ht 59.0 in | Wt 216.0 lb

## 2013-03-14 DIAGNOSIS — I10 Essential (primary) hypertension: Secondary | ICD-10-CM

## 2013-03-14 DIAGNOSIS — Z0000 Encounter for general adult medical examination without abnormal findings: Secondary | ICD-10-CM

## 2013-03-14 DIAGNOSIS — R739 Hyperglycemia, unspecified: Secondary | ICD-10-CM

## 2013-03-14 DIAGNOSIS — R7309 Other abnormal glucose: Secondary | ICD-10-CM

## 2013-03-14 LAB — HEMOGLOBIN A1C
Hgb A1c MFr Bld: 6.1 % — ABNORMAL HIGH (ref <5.7)
MEAN PLASMA GLUCOSE: 128 mg/dL — AB (ref <117)

## 2013-03-14 NOTE — Patient Instructions (Signed)
Please complete lab work prior to leaving, follow up in 3 months.

## 2013-03-14 NOTE — Progress Notes (Signed)
Subjective:    Patient ID: Leah [name], female    DOB: Mar 05, 1960, 53 y.o.   MRN: [id number]  HPI Leah [name] is a 53 year old female who presents today for follow up of hypertension and for annual physical.   Hypertension) Patient has had elevated blood pressure readings in the office recently and was started on HCTZ in addition to amlodipine and diovan. Patient denies problems with HCTZ.  BP Readings from Last 3 Encounters:  03/14/13 118/88  03/08/13 151/87  02/22/13 140/78   Physical) Patient presents today for complete physical.  Patient had 1/2 cup of coffee with cream and sugar today and reports she's had a CBC with diff and Complete Metabolic Panel drawn with oncology on 03/08/13.  Immunizations:Influenza in October 2014 Diet: Patient reports she had lost some weight on weight watchers, patient plans on starting nutrisystem meals this week. Wt Readings from Last 3 Encounters:  03/14/13 216 lb (97.977 kg)  03/08/13 214 lb (97.07 kg)  03/01/13 213 lb 0.6 oz (96.634 kg)   Exercise: Patient reports water aerobics three days per week. Colonoscopy: Patient reports she had one in 2010 and will schedule one this year. Dexa: Patient doesn't remember when last scan, but intends on scheduling an appointment.  Pap Smear: Patient sees Dr. Raquel Sarna and had exam in August 2014 Mammogram: Patient has had bilateral mastectomy with reconstruction in 2004.     Review of Systems  Constitutional: Negative for fever and appetite change.  HENT: Negative for rhinorrhea.   Eyes: Negative for itching.  Respiratory: Positive for shortness of breath. Negative for cough.        Patient reports SOB from octreotide, but has resided.  Cardiovascular: Positive for chest pain.  Gastrointestinal: Negative for nausea, vomiting and abdominal pain.  Genitourinary: Negative for difficulty urinating.  Musculoskeletal: Positive for neck pain. Negative for back pain.       Chronic pain to neck  Neurological:  Positive for headaches. Negative for weakness.       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  Follows with [facility].   Psychiatric/Behavioral: Negative for sleep disturbance.       Denies depression, managed with Effexor.   Past Medical History  Diagnosis Date  . Sarcoidosis 2008  . Allergic rhinitis   . GE reflux   . Neuroendocrine tumor 2011  . Surgical menopause   . Migraines   . Atrophic vaginitis   . STD (sexually transmitted disease)     HSV I  . Hepatic encephalopathy 2011  . HTN (hypertension) 2011  . Carcinoid syndrome     History   Social History  . Marital Status: Married    Spouse Name: N/A    Number of Children: N/A  . Years of Education: N/A   Occupational History  . Not on file.   Social History Main Topics  . Smoking status: Never Smoker   . Smokeless tobacco: Never Used  . Alcohol Use: No     Comment: about 1x q 3 months  . Drug Use: No  . Sexual Activity: Yes    Birth Control/ Protection: Surgical     Comment: hysterectomy   Other Topics Concern  . Not on file   Social History Narrative   Married to [place]   2 sons- 2001 Kasandra Knudsen and  alex (1999)   Has dog   She is unemployed, has worked in the past (office work)   Completed some college   Enjoys Theatre manager  Past Surgical History  Procedure Laterality Date  . Tonsillectomy  1976  . Cesarean section  [id number]  . Cholecystectomy  2006  . Abdominal hysterectomy  2004  . Oophorectomy  2004  . Mastectomy  2008    bilateral  . Bilateral mastectectomy Bilateral 2008  . Latissimus dorsi graft  2008    used for reconstructive breast surgery  . Hepatetetomy    . Tubal ligation  2001    BTL  . Breast surgery      bilateral masectomy and reconstruction with silicone implants  . Knee arthroscopy Left 09/2010    Family History  Problem Relation Age of Onset  . Arthritis Maternal Grandmother   . Arthritis Maternal Grandfather   . Arthritis Paternal Grandmother     . Arthritis Paternal Grandfather   . Heart failure Paternal Grandfather   . Breast cancer Mother 5    died at 59  . Lupus Mother   . Heart failure Father   . Hypertension Father   . Hyperlipidemia Father   . Breast cancer Maternal Aunt 38    deceased at 65 recurrence  . Ovarian cancer Maternal Aunt 70     died with in 48 months  . Breast cancer Maternal Aunt 40    died at age 49    No Known Allergies  Current Outpatient Prescriptions on File Prior to Visit  Medication Sig Dispense Refill  . albuterol (PROVENTIL HFA;VENTOLIN HFA) 108 (90 BASE) MCG/ACT inhaler Inhale 2 puffs into the lungs every 6 (six) hours as needed. For shortness of breath.      Marland Kitchen amLODipine (NORVASC) 10 MG tablet Take 1 tablet (10 mg total) by mouth daily.  30 tablet  1  . [facility] ALLERGY RELIEF 180 MG tablet TAKE 1 TABLET BY MOUTH EVERY DAY  30 tablet  10  . Diclofenac Sodium (PENNSAID) 1.5 % SOLN Place 40 drops onto the skin 4 (four) times daily as needed. For joint pain.      . hydrochlorothiazide (HYDRODIURIL) 25 MG tablet Take 1 tablet (25 mg total) by mouth daily.  30 tablet  0  . montelukast (SINGULAIR) 10 MG tablet TAKE 1 TABLET AT BEDTIME  90 tablet  2  . naproxen (NAPROSYN) 500 MG tablet Take 1 tablet by mouth 2 (two) times daily.      Marland Kitchen octreotide (SANDOSTATIN LAR) 30 MG injection Inject 30 mg into the muscle every 14 (fourteen) days.      . ondansetron (ZOFRAN) 4 MG tablet Take 4 mg by mouth every 8 (eight) hours as needed. For nausea.      . Ospemifene (OSPHENA) 60 MG TABS Take 1 tablet by mouth daily.  90 tablet  3  . pantoprazole (PROTONIX) 40 MG tablet Take 40 mg by mouth 2 (two) times daily.      Marland Kitchen triamcinolone cream (KENALOG) 0.1 % APPLY ON RASH THREE TIMES A DAY as needed      . valACYclovir (VALTREX) 500 MG tablet TAKE 1 TABLET TWO TIMES A DAY as needed.      . valsartan (DIOVAN) 160 MG tablet Take 1 tablet (160 mg total) by mouth daily.  90 tablet  3  . venlafaxine XR (EFFEXOR-XR) 150 MG 24 hr  capsule Take 1 capsule (150 mg total) by mouth daily.  90 capsule  3   No current facility-administered medications on file prior to visit.    BP 118/88  Pulse 115  Temp(Src) 98.4 F (36.9 C) (Oral)  Resp 16  Ht  4\' 11"  (1.499 m)  Wt 216 lb (97.977 kg)  BMI 43.60 kg/m2  SpO2 96%       Objective:   Physical Exam  Constitutional: She is oriented to person, place, and time. She appears well-nourished.  HENT:  Head: Normocephalic.  Right Ear: External ear normal.  Left Ear: External ear normal.  Cerumen present to bilateral canals.  Eyes: Conjunctivae and EOM are normal. Pupils are equal, round, and reactive to light.  Neck: Normal range of motion. Neck supple. No thyromegaly present.  Cardiovascular: Normal rate, regular rhythm and normal heart sounds.   Pulmonary/Chest: Breath sounds normal. No respiratory distress. She has no wheezes.  Abdominal: Soft. Bowel sounds are normal. She exhibits no distension. There is no tenderness.  Musculoskeletal: Normal range of motion. She exhibits no edema.  Lymphadenopathy:    She has no cervical adenopathy.  Neurological: She is alert and oriented to person, place, and time. She displays normal reflexes.  Skin: Skin is warm and dry. No erythema.  Psychiatric: She has a normal mood and affect.          Assessment & Plan:  I have personally seen and examined patient and agree with Allie Bossier NP student's assessment and plan.  Bilateral TM's were irrigated for cerumen removal.

## 2013-03-14 NOTE — Assessment & Plan Note (Signed)
Stable with the addition of HCTZ. Continue same. BP today was 118/88.

## 2013-03-14 NOTE — Assessment & Plan Note (Signed)
Stable. Discussed importance of healthy diet and exercise. Will obtain routine labs that were not already ordered by oncology.

## 2013-03-14 NOTE — Progress Notes (Signed)
Pre visit review using our clinic review tool, if applicable. No additional management support is needed unless otherwise documented below in the visit note. 

## 2013-03-15 ENCOUNTER — Telehealth: Payer: Self-pay | Admitting: Family

## 2013-03-15 ENCOUNTER — Encounter: Payer: Self-pay | Admitting: Hematology & Oncology

## 2013-03-15 ENCOUNTER — Ambulatory Visit: Payer: BC Managed Care – PPO | Admitting: Family

## 2013-03-15 LAB — URINALYSIS, ROUTINE W REFLEX MICROSCOPIC
Glucose, UA: 100 mg/dL — AB
Hgb urine dipstick: NEGATIVE
Ketones, ur: NEGATIVE mg/dL
LEUKOCYTES UA: NEGATIVE
NITRITE: NEGATIVE
Protein, ur: NEGATIVE mg/dL
SPECIFIC GRAVITY, URINE: 1.025 (ref 1.005–1.030)
Urobilinogen, UA: 0.2 mg/dL (ref 0.0–1.0)
pH: 5 (ref 5.0–8.0)

## 2013-03-15 NOTE — Telephone Encounter (Signed)
Relevant patient education assigned to patient using Emmi. ° °

## 2013-03-16 ENCOUNTER — Telehealth: Payer: Self-pay | Admitting: Family

## 2013-03-16 NOTE — Telephone Encounter (Signed)
Relevant patient education mailed to patient.  

## 2013-03-17 ENCOUNTER — Encounter: Payer: Self-pay | Admitting: Family

## 2013-03-17 DIAGNOSIS — E119 Type 2 diabetes mellitus without complications: Secondary | ICD-10-CM

## 2013-03-17 DIAGNOSIS — R7303 Prediabetes: Secondary | ICD-10-CM

## 2013-03-17 DIAGNOSIS — E1169 Type 2 diabetes mellitus with other specified complication: Secondary | ICD-10-CM | POA: Insufficient documentation

## 2013-03-17 HISTORY — DX: Prediabetes: R73.03

## 2013-03-22 ENCOUNTER — Other Ambulatory Visit: Payer: Self-pay | Admitting: *Deleted

## 2013-03-22 ENCOUNTER — Ambulatory Visit (HOSPITAL_BASED_OUTPATIENT_CLINIC_OR_DEPARTMENT_OTHER): Payer: BC Managed Care – PPO

## 2013-03-22 VITALS — BP 123/85 | HR 118 | Temp 98.0°F | Resp 16

## 2013-03-22 DIAGNOSIS — E340 Carcinoid syndrome: Secondary | ICD-10-CM

## 2013-03-22 DIAGNOSIS — C7A00 Malignant carcinoid tumor of unspecified site: Secondary | ICD-10-CM

## 2013-03-22 DIAGNOSIS — C7B8 Other secondary neuroendocrine tumors: Secondary | ICD-10-CM

## 2013-03-22 DIAGNOSIS — C787 Secondary malignant neoplasm of liver and intrahepatic bile duct: Secondary | ICD-10-CM

## 2013-03-22 MED ORDER — OCTREOTIDE ACETATE 30 MG IM KIT
PACK | INTRAMUSCULAR | Status: AC
  Filled 2013-03-22: qty 1

## 2013-03-22 MED ORDER — OCTREOTIDE ACETATE 30 MG IM KIT
30.0000 mg | PACK | Freq: Once | INTRAMUSCULAR | Status: AC
  Administered 2013-03-22: 30 mg via INTRAMUSCULAR

## 2013-03-23 ENCOUNTER — Encounter: Payer: Self-pay | Admitting: Family

## 2013-04-05 ENCOUNTER — Ambulatory Visit (HOSPITAL_BASED_OUTPATIENT_CLINIC_OR_DEPARTMENT_OTHER): Payer: BC Managed Care – PPO

## 2013-04-05 DIAGNOSIS — C787 Secondary malignant neoplasm of liver and intrahepatic bile duct: Secondary | ICD-10-CM

## 2013-04-05 DIAGNOSIS — C7B8 Other secondary neuroendocrine tumors: Secondary | ICD-10-CM

## 2013-04-05 DIAGNOSIS — C7A00 Malignant carcinoid tumor of unspecified site: Secondary | ICD-10-CM

## 2013-04-05 DIAGNOSIS — E340 Carcinoid syndrome: Secondary | ICD-10-CM

## 2013-04-05 MED ORDER — OCTREOTIDE ACETATE 30 MG IM KIT
PACK | INTRAMUSCULAR | Status: AC
  Filled 2013-04-05: qty 1

## 2013-04-05 MED ORDER — OCTREOTIDE ACETATE 30 MG IM KIT
30.0000 mg | PACK | Freq: Once | INTRAMUSCULAR | Status: AC
  Administered 2013-04-05: 30 mg via INTRAMUSCULAR

## 2013-04-05 NOTE — Patient Instructions (Signed)

## 2013-04-06 ENCOUNTER — Other Ambulatory Visit: Payer: Self-pay | Admitting: Internal Medicine

## 2013-04-07 ENCOUNTER — Encounter: Payer: Self-pay | Admitting: Family

## 2013-04-08 ENCOUNTER — Ambulatory Visit (INDEPENDENT_AMBULATORY_CARE_PROVIDER_SITE_OTHER): Payer: BC Managed Care – PPO | Admitting: Physician Assistant

## 2013-04-08 ENCOUNTER — Telehealth: Payer: Self-pay | Admitting: *Deleted

## 2013-04-08 VITALS — BP 105/76

## 2013-04-08 DIAGNOSIS — R002 Palpitations: Secondary | ICD-10-CM

## 2013-04-08 DIAGNOSIS — R42 Dizziness and giddiness: Secondary | ICD-10-CM

## 2013-04-08 DIAGNOSIS — R55 Syncope and collapse: Secondary | ICD-10-CM

## 2013-04-08 LAB — CBC WITH DIFFERENTIAL/PLATELET
BASOS PCT: 0 % (ref 0–1)
Basophils Absolute: 0 10*3/uL (ref 0.0–0.1)
Eosinophils Absolute: 0.2 10*3/uL (ref 0.0–0.7)
Eosinophils Relative: 2 % (ref 0–5)
HEMATOCRIT: 39.8 % (ref 36.0–46.0)
Hemoglobin: 13.6 g/dL (ref 12.0–15.0)
LYMPHS ABS: 4.1 10*3/uL — AB (ref 0.7–4.0)
LYMPHS PCT: 36 % (ref 12–46)
MCH: 30.5 pg (ref 26.0–34.0)
MCHC: 34.2 g/dL (ref 30.0–36.0)
MCV: 89.2 fL (ref 78.0–100.0)
MONO ABS: 0.7 10*3/uL (ref 0.1–1.0)
Monocytes Relative: 6 % (ref 3–12)
Neutro Abs: 6.4 10*3/uL (ref 1.7–7.7)
Neutrophils Relative %: 56 % (ref 43–77)
Platelets: 306 10*3/uL (ref 150–400)
RBC: 4.46 MIL/uL (ref 3.87–5.11)
RDW: 14.1 % (ref 11.5–15.5)
WBC: 11.5 10*3/uL — ABNORMAL HIGH (ref 4.0–10.5)

## 2013-04-08 LAB — BASIC METABOLIC PANEL
BUN: 25 mg/dL — AB (ref 6–23)
CO2: 27 meq/L (ref 19–32)
Calcium: 9.5 mg/dL (ref 8.4–10.5)
Chloride: 100 mEq/L (ref 96–112)
Creat: 0.88 mg/dL (ref 0.50–1.10)
GLUCOSE: 133 mg/dL — AB (ref 70–99)
POTASSIUM: 3.9 meq/L (ref 3.5–5.3)
Sodium: 139 mEq/L (ref 135–145)

## 2013-04-08 LAB — TSH: TSH: 1.955 u[IU]/mL (ref 0.350–4.500)

## 2013-04-08 MED ORDER — HYDROCHLOROTHIAZIDE 25 MG PO TABS: 25.0000 mg | ORAL_TABLET | Freq: Every day | ORAL | Status: DC

## 2013-04-08 NOTE — Telephone Encounter (Signed)
Received fax request from [facility] for HCTZ 25mg . Refill sent.

## 2013-04-08 NOTE — Progress Notes (Signed)
Pre visit review using our clinic review tool, if applicable. No additional management support is needed unless otherwise documented below in the visit note/SLS  

## 2013-04-08 NOTE — Telephone Encounter (Signed)
Please call pt.  Due to episode of syncope, I recommend she be seen today in the office.  If still dizzy/symptomatic needs to go to the ED.  We are full but perhaps she can be scheduled at [facility] this afternoon. She should have someone else drive her.

## 2013-04-08 NOTE — Patient Instructions (Signed)
Please obtain labs. I will call you with your results.  Please continue medications as prescribed.  Make sure to increase fluid intake and avoid skipping meals.  I am referring you to cardiology for evaluation.  I will place an order for an echocardiogram.  Please follow-up with Oncologist.  It seems there is a correlation between your symptoms and the octreotide.  If symptoms recur or worsen, you need to proceed directly to the ER.

## 2013-04-08 NOTE — Telephone Encounter (Signed)
Spoke with pt. She states that she still has dizziness upon standing and is cautious to move slowly. Advised her of recommendations below and she voices understanding and is agreeable to be seen at another clinic. Transferred pt to front office to arrange appt.

## 2013-04-10 ENCOUNTER — Encounter: Payer: Self-pay | Admitting: Physician Assistant

## 2013-04-10 DIAGNOSIS — R002 Palpitations: Secondary | ICD-10-CM | POA: Insufficient documentation

## 2013-04-10 DIAGNOSIS — R55 Syncope and collapse: Secondary | ICD-10-CM | POA: Insufficient documentation

## 2013-04-10 NOTE — Assessment & Plan Note (Addendum)
Possibly multifactorial giving recent octreotide administration, significant dietary changes, and dehydration in combination with antihypertensive agents.  EKG obtained revealing sinus tachycardia with rate of 102 bpm, relatively unchanged from previous EKG on 10/06/2012.  Patient's SBP dropped 25 mm Hg from sitting to standing position, which indicates borderline orthostatic hypotension in a patient on antihypertensive medications.  Will obtain CBC, CMP.  Will order 2D echocardiogram to assess for structural abnormalities. Referral placed to cardiology for further evaluation and management.  Patient to contact Oncologist to discuss Octreotide further and to see if there are any other alternative medication regimens.  Instructed patient to maintain adequate hydration. Avoid quick positional changes. Follow-up in 2 weeks.  Return sooner if needed.  Patient educated that if she were to experience near syncope or another syncopal event, she should proceed directly to the ER.  Patient voiced understanding.

## 2013-04-10 NOTE — Progress Notes (Signed)
Patient presents to clinic today c/o syncopal episode she sustained Thursday evening while at home.  Patient states she has been experiencing both dizziness and lightheadedness, usually upon standing over the past week.  Patient denies history of orthostatic hypotension or syncope.  Patient denies hx of anemia.  Patient states that she got up from her chair and walked across the room to the kitchen.  Was feeling lightheaded and then passed out.  Patient was caught by a family member as she was falling.  Patient was unconscious for < 1 min. according to family member.  Patient denies retrograde or anterograde amnesia.  Denies neurologic deficit or confusion.  Patient denies chest pain or shortness of breath prior to fall.  Does endorse palpitations prior to syncopal event.  Patient denies known history of arrhythmia.  Patient states she has been suffering from occasional palpitations for several months.  Patient has been evaluated for palpitations in the past.  Seems the provider felt there might be a correlation between the patient's palpitations and her octreotide administrations.  Patient states she just had her octreotide given to her this past Monday.  Started feeling lightheaded with occasional palpitations Monday evening/Tuesday morning.  Was very minimal until the time of her syncopal event.  Patient does also mention that she has recently started NutriSystem for weight loss and that her diet has completely changed.  Endorses she is probably not staying well-hydrated enough.  Past Medical History  Diagnosis Date  . Sarcoidosis 2008  . Allergic rhinitis   . GE reflux   . Neuroendocrine tumor 2011  . Surgical menopause   . Migraines   . Atrophic vaginitis   . STD (sexually transmitted disease)     HSV I  . Hepatic encephalopathy 2011  . HTN (hypertension) 2011  . Carcinoid syndrome   . Borderline diabetes 03/17/2013    Current Outpatient Prescriptions on File Prior to Visit  Medication Sig  Dispense Refill  . albuterol (PROVENTIL HFA;VENTOLIN HFA) 108 (90 BASE) MCG/ACT inhaler Inhale 2 puffs into the lungs every 6 (six) hours as needed. For shortness of breath.      Marland Kitchen amLODipine (NORVASC) 10 MG tablet Take 1 tablet (10 mg total) by mouth daily.  30 tablet  1  . [facility] ALLERGY RELIEF 180 MG tablet TAKE 1 TABLET BY MOUTH EVERY DAY  30 tablet  10  . Diclofenac Sodium (PENNSAID) 1.5 % SOLN Place 40 drops onto the skin 4 (four) times daily as needed. For joint pain.      . hydrochlorothiazide (HYDRODIURIL) 25 MG tablet Take 1 tablet (25 mg total) by mouth daily.  90 tablet  1  . montelukast (SINGULAIR) 10 MG tablet TAKE 1 TABLET AT BEDTIME  90 tablet  2  . naproxen (NAPROSYN) 500 MG tablet Take 1 tablet by mouth 2 (two) times daily.      Marland Kitchen octreotide (SANDOSTATIN LAR) 30 MG injection Inject 30 mg into the muscle every 14 (fourteen) days.      . ondansetron (ZOFRAN) 4 MG tablet Take 4 mg by mouth every 8 (eight) hours as needed. For nausea.      . Ospemifene (OSPHENA) 60 MG TABS Take 1 tablet by mouth daily.  90 tablet  3  . pantoprazole (PROTONIX) 40 MG tablet Take 40 mg by mouth 2 (two) times daily.      Marland Kitchen triamcinolone cream (KENALOG) 0.1 % APPLY ON RASH THREE TIMES A DAY as needed      . valACYclovir (VALTREX) 500 MG  tablet TAKE 1 TABLET TWO TIMES A DAY as needed.      . valsartan (DIOVAN) 160 MG tablet Take 1 tablet (160 mg total) by mouth daily.  90 tablet  3  . venlafaxine XR (EFFEXOR-XR) 150 MG 24 hr capsule Take 1 capsule (150 mg total) by mouth daily.  90 capsule  3   No current facility-administered medications on file prior to visit.    No Known Allergies  Family History  Problem Relation Age of Onset  . Arthritis Maternal Grandmother   . Arthritis Maternal Grandfather   . Arthritis Paternal Grandmother   . Arthritis Paternal Grandfather   . Heart failure Paternal Grandfather   . Breast cancer Mother 36    died at 51  . Lupus Mother   . Heart failure Father   .  Hypertension Father   . Hyperlipidemia Father   . Breast cancer Maternal Aunt 38    deceased at 73 recurrence  . Ovarian cancer Maternal Aunt 41     died with in 40 months  . Breast cancer Maternal Aunt 60    died at age 28    History   Social History  . Marital Status: Married    Spouse Name: N/A    Number of Children: N/A  . Years of Education: N/A   Social History Main Topics  . Smoking status: Never Smoker   . Smokeless tobacco: Never Used  . Alcohol Use: No     Comment: about 1x q 3 months  . Drug Use: No  . Sexual Activity: Yes    Birth Control/ Protection: Surgical     Comment: hysterectomy   Other Topics Concern  . Not on file   Social History Narrative   Married to [place]   2 sons- 2001 [name] (1999)   Has dog   She is unemployed, has worked in the past (office work)   Completed some college   Enjoys painting and writing   Review of Systems - See HPI.  All other ROS are negative.  Filed Vitals:   04/08/13 1610 04/08/13 1611 04/08/13 1612  BP: 123/86 130/86 105/76   Physical Exam  Vitals reviewed. Constitutional: She is oriented to person, place, and time and well-developed, well-nourished, and in no distress.  HENT:  Head: Normocephalic and atraumatic.  Right Ear: External ear normal.  Left Ear: External ear normal.  Nose: Nose normal.  Mouth/Throat: Oropharynx is clear and moist. No oropharyngeal exudate.  TM within normal limits bilaterally.  Eyes: Conjunctivae are normal. Pupils are equal, round, and reactive to light.  Neck: Neck supple.  Cardiovascular: Normal rate, regular rhythm, normal heart sounds and intact distal pulses.   Pulmonary/Chest: Effort normal and breath sounds normal. No respiratory distress. She has no wheezes. She has no rales. She exhibits no tenderness.  Lymphadenopathy:    She has no cervical adenopathy.  Neurological: She is alert and oriented to person, place, and time.  Skin: Skin is warm and dry. No rash  noted.  Psychiatric: Affect normal.    Recent Results (from the past 2160 hour(s))  CBC WITH DIFFERENTIAL ([facility])     Status: None   Collection Time    03/08/13 10:28 AM      Result Value Ref Range   WBC 9.0  3.9 - 10.0 10e3/uL   RBC 4.08  3.70 - 5.32 10e6/uL   HGB 12.2  11.6 - 15.9 g/dL   HCT 37.0  34.8 - 46.6 %  MCV 91  81 - 101 fL   MCH 29.9  26.0 - 34.0 pg   MCHC 33.0  32.0 - 36.0 g/dL   RDW 13.7  11.1 - 15.7 %   Platelets 253  145 - 400 10e3/uL   NEUT# 4.9  1.5 - 6.5 10e3/uL   LYMPH# 3.2  0.9 - 3.3 10e3/uL   MONO# 0.6  0.1 - 0.9 10e3/uL   Eosinophils Absolute 0.2  0.0 - 0.5 10e3/uL   BASO# 0.0  0.0 - 0.2 10e3/uL   NEUT% 55.1  39.6 - 80.0 %   LYMPH% 35.9  14.0 - 48.0 %   MONO% 7.0  0.0 - 13.0 %   EOS% 1.8  0.0 - 7.0 %   BASO% 0.2  0.0 - 2.0 %  CHROMOGRANIN A     Status: None   Collection Time    03/08/13 10:28 AM      Result Value Ref Range   Chromogranin A 7.0  1.9 - 15.0 ng/mL   Comment:  This test was performed using a laboratory developedelectrochemiluminescent method. Values obtained with different assaymethods cannot be used interchangeably. Chromogranin A levels,regardless of value, should not be interpreted as absolute evidence      ofthe presence or absence of disease.  This test was developed and its performance characteristics have beendetermined by [employer], [place]. Performance characteristics refer to the analyticalperformance of the test.  LACTATE DEHYDROGENASE     Status: None   Collection Time    03/08/13 10:28 AM      Result Value Ref Range   LDH 155  94 - 250 U/L  COMPREHENSIVE METABOLIC PANEL ([facility] REF[facility] ONLY)     Status: Abnormal   Collection Time    03/08/13 10:28 AM      Result Value Ref Range   Sodium 138  128 - 145 mEq/L   Potassium 3.8  3.3 - 4.7 mEq/L   Chloride 103  98 - 108 mEq/L   CO2 29  18 - 33 mEq/L   Glucose, Bld 123 (*) 73 - 118 mg/dL   BUN, Bld 17  7 - 22 mg/dL   Creat 0.6  0.6  - 1.2 mg/dl   Total Bilirubin 0.60  0.20 - 1.60 mg/dl   Alkaline Phosphatase 66  26 - 84 U/L   AST 18  11 - 38 U/L   ALT(SGPT) 21  10 - 47 U/L   Total Protein 6.9  6.4 - 8.1 g/dL   Albumin 3.2 (*) 3.3 - 5.5 g/dL   Calcium 9.0  8.0 - 10.3 mg/dL  HEMOGLOBIN A1C     Status: Abnormal   Collection Time    03/14/13 10:41 AM      Result Value Ref Range   Hemoglobin A1C 6.1 (*) <5.7 %   Comment:                                                                            According to the ADA Clinical Practice Recommendations for 2011, when     HbA1c is used as a screening test:             >=6.5%   Diagnostic of Diabetes Mellitus                (  if abnormal result is confirmed)           5.7-6.4%   Increased risk of developing Diabetes Mellitus           References:Diagnosis and Classification of Diabetes Mellitus,Diabetes     CNOB,0962,83(MOQHU 1):S62-S69 and Standards of Medical Care in             Diabetes - 2011,Diabetes TMLY,6503,54 (Suppl 1):S11-S61.         Mean Plasma Glucose 128 (*) <117 mg/dL  URINALYSIS, ROUTINE W REFLEX MICROSCOPIC     Status: Abnormal   Collection Time    03/14/13 10:41 AM      Result Value Ref Range   Color, Urine YELLOW  YELLOW   APPearance CLOUDY (*) CLEAR   Specific Gravity, Urine 1.025  1.005 - 1.030   pH 5.0  5.0 - 8.0   Glucose, UA 100 (*) NEG mg/dL   Bilirubin Urine SMALL (*) NEG   Ketones, ur NEG  NEG mg/dL   Hgb urine dipstick NEG  NEG   Protein, ur NEG  NEG mg/dL   Urobilinogen, UA 0.2  0.0 - 1.0 mg/dL   Nitrite NEG  NEG   Leukocytes, UA NEG  NEG  TSH     Status: None   Collection Time    04/08/13  4:44 PM      Result Value Ref Range   TSH 1.955  0.350 - 4.500 uIU/mL  CBC WITH DIFFERENTIAL     Status: Abnormal   Collection Time    04/08/13  4:44 PM      Result Value Ref Range   WBC 11.5 (*) 4.0 - 10.5 K/uL   RBC 4.46  3.87 - 5.11 MIL/uL   Hemoglobin 13.6  12.0 - 15.0 g/dL   HCT 39.8  36.0 - 46.0 %   MCV 89.2  78.0 - 100.0 fL    MCH 30.5  26.0 - 34.0 pg   MCHC 34.2  30.0 - 36.0 g/dL   RDW 14.1  11.5 - 15.5 %   Platelets 306  150 - 400 K/uL   Neutrophils Relative % 56  43 - 77 %   Neutro Abs 6.4  1.7 - 7.7 K/uL   Lymphocytes Relative 36  12 - 46 %   Lymphs Abs 4.1 (*) 0.7 - 4.0 K/uL   Monocytes Relative 6  3 - 12 %   Monocytes Absolute 0.7  0.1 - 1.0 K/uL   Eosinophils Relative 2  0 - 5 %   Eosinophils Absolute 0.2  0.0 - 0.7 K/uL   Basophils Relative 0  0 - 1 %   Basophils Absolute 0.0  0.0 - 0.1 K/uL   Smear Review Criteria for review not met    BASIC METABOLIC PANEL     Status: Abnormal   Collection Time    04/08/13  4:44 PM      Result Value Ref Range   Sodium 139  135 - 145 mEq/L   Potassium 3.9  3.5 - 5.3 mEq/L   Chloride 100  96 - 112 mEq/L   CO2 27  19 - 32 mEq/L   Glucose, Bld 133 (*) 70 - 99 mg/dL   BUN 25 (*) 6 - 23 mg/dL   Creat 0.88  0.50 - 1.10 mg/dL   Calcium 9.5  8.4 - 10.5 mg/dL    Assessment/Plan: Syncope and collapse Possibly multifactorial giving recent octreotide administration, significant dietary changes, and dehydration in combination with antihypertensive agents.  EKG obtained revealing  sinus tachycardia with rate of 102 bpm, relatively unchanged from previous EKG on 10/06/2012.  Patient's SBP dropped 25 mm Hg from sitting to standing position, which indicates borderline orthostatic hypotension in a patient on antihypertensive medications.  Will obtain CBC, CMP.  Will order 2D echocardiogram to assess for structural abnormalities. Referral placed to cardiology for further evaluation and management.  Patient to contact Oncologist to discuss Octreotide further and to see if there are any other alternative medication regimens.  Instructed patient to maintain adequate hydration. Avoid quick positional changes. Follow-up in 2 weeks.  Return sooner if needed.  Patient educated that if she were to experience near syncope or another syncopal event, she should proceed directly to the ER.  Patient  voiced understanding.  Palpitations EKG with sinus tach.  Possibly medication side effect. Will obtain labs.  Echo ordered due to syncopal event. Patient referred to Cardiology for further assessment.   I spent >30 minutes with the patient including time for history, examination, diagnostics and counseling.

## 2013-04-10 NOTE — Assessment & Plan Note (Signed)
EKG with sinus tach.  Possibly medication side effect. Will obtain labs.  Echo ordered due to syncopal event. Patient referred to Cardiology for further assessment.

## 2013-04-11 ENCOUNTER — Encounter: Payer: Self-pay | Admitting: Hematology & Oncology

## 2013-04-11 ENCOUNTER — Other Ambulatory Visit: Payer: Self-pay | Admitting: Internal Medicine

## 2013-04-13 ENCOUNTER — Ambulatory Visit (HOSPITAL_BASED_OUTPATIENT_CLINIC_OR_DEPARTMENT_OTHER)
Admission: RE | Admit: 2013-04-13 | Discharge: 2013-04-13 | Disposition: A | Discharge status: Home / Self Care | Payer: BC Managed Care – PPO | Source: Ambulatory Visit | Attending: Physician Assistant | Admitting: Physician Assistant

## 2013-04-13 DIAGNOSIS — I517 Cardiomegaly: Secondary | ICD-10-CM

## 2013-04-13 DIAGNOSIS — R55 Syncope and collapse: Secondary | ICD-10-CM | POA: Insufficient documentation

## 2013-04-13 NOTE — Progress Notes (Signed)
  Echocardiogram 2D Echocardiogram has been performed.  Leah [name] 04/13/2013, 11:01 AM

## 2013-04-19 ENCOUNTER — Ambulatory Visit (HOSPITAL_BASED_OUTPATIENT_CLINIC_OR_DEPARTMENT_OTHER): Payer: BC Managed Care – PPO

## 2013-04-19 VITALS — BP 117/81 | HR 95 | Temp 97.9°F | Ht 63.0 in

## 2013-04-19 DIAGNOSIS — C7A00 Malignant carcinoid tumor of unspecified site: Secondary | ICD-10-CM

## 2013-04-19 DIAGNOSIS — C787 Secondary malignant neoplasm of liver and intrahepatic bile duct: Secondary | ICD-10-CM

## 2013-04-19 DIAGNOSIS — C7B8 Other secondary neuroendocrine tumors: Secondary | ICD-10-CM

## 2013-04-19 MED ORDER — OCTREOTIDE ACETATE 30 MG IM KIT
PACK | INTRAMUSCULAR | Status: AC
  Filled 2013-04-19: qty 1

## 2013-04-19 MED ORDER — OCTREOTIDE ACETATE 30 MG IM KIT
30.0000 mg | PACK | Freq: Once | INTRAMUSCULAR | Status: AC
  Administered 2013-04-19: 30 mg via INTRAMUSCULAR

## 2013-04-19 NOTE — Patient Instructions (Signed)

## 2013-04-22 ENCOUNTER — Ambulatory Visit (INDEPENDENT_AMBULATORY_CARE_PROVIDER_SITE_OTHER): Payer: BC Managed Care – PPO | Admitting: Cardiovascular Disease

## 2013-04-22 ENCOUNTER — Encounter: Payer: Self-pay | Admitting: Cardiovascular Disease

## 2013-04-22 VITALS — BP 142/86 | HR 83 | Ht 59.0 in | Wt 213.1 lb

## 2013-04-22 DIAGNOSIS — R002 Palpitations: Secondary | ICD-10-CM

## 2013-04-22 DIAGNOSIS — I10 Essential (primary) hypertension: Secondary | ICD-10-CM

## 2013-04-22 NOTE — Progress Notes (Signed)
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 She status post partial hepatectomy at [name] back in 2011. Her palms include hypertension, palpitations or orthostatic hypotension. Dr. Burney Gauze is her oncologist. She complains of palpitations her monthly basis or really temporally related to her carcinoid medication. This can occur for minutes or up to hours at a time. She has had orthostatic hypotension as well.   Current Outpatient Prescriptions  Medication Sig Dispense Refill  . albuterol (PROVENTIL HFA;VENTOLIN HFA) 108 (90 BASE) MCG/ACT inhaler Inhale 2 puffs into the lungs every 6 (six) hours as needed. For shortness of breath.      Marland Kitchen amLODipine (NORVASC) 10 MG tablet Take 1 tablet (10 mg total) by mouth daily.  30 tablet  1  . [facility] ALLERGY RELIEF 180 MG tablet TAKE 1 TABLET BY MOUTH EVERY DAY  30 tablet  10  . Diclofenac Sodium (PENNSAID) 1.5 % SOLN Place 40 drops onto the skin 4 (four) times daily as needed. For joint pain.      . dorzolamide-timolol (COSOPT) 22.3-6.8 MG/ML ophthalmic solution Place 1 drop into the right eye 2 (two) times daily.      . metroNIDAZOLE (METROCREAM) 0.75 % cream Apply 1 application topically as needed.       . montelukast (SINGULAIR) 10 MG tablet TAKE 1 TABLET AT BEDTIME  90 tablet  2  . naproxen (NAPROSYN) 500 MG tablet Take 1 tablet by mouth 2 (two) times daily.      Marland Kitchen octreotide (SANDOSTATIN LAR) 30 MG injection Inject 30 mg into the muscle every 14 (fourteen) days.      . ondansetron (ZOFRAN) 4 MG tablet Take 4 mg by  mouth every 8 (eight) hours as needed. For nausea.      . Ospemifene (OSPHENA) 60 MG TABS Take 1 tablet by mouth daily.  90 tablet  3  . pantoprazole (PROTONIX) 40 MG tablet Take 40 mg by mouth 2 (two) times daily.      Marland Kitchen triamcinolone cream (KENALOG) 0.1 % APPLY ON RASH THREE TIMES A DAY as needed      . valACYclovir (VALTREX) 500 MG tablet TAKE 1 TABLET TWO TIMES A DAY as needed.      . valsartan (DIOVAN) 160 MG tablet Take 1 tablet (160 mg total) by mouth daily.  90 tablet  3  . venlafaxine XR (EFFEXOR-XR) 150 MG 24 hr capsule Take 1 capsule (150 mg total) by mouth daily.  90 capsule  3   No current facility-administered medications for this visit.    No Known Allergies  History   Social History  . Marital Status: Married    Spouse Name: N/A    Number of Children: N/A  . Years of Education: N/A   Occupational History  . Not on file.   Social History Main Topics  . Smoking status: Never Smoker   . Smokeless tobacco: Never Used  . Alcohol Use: No     Comment: about 1x q 3 months  . Drug Use: No  . Sexual Activity: Yes    Birth Control/ Protection: Surgical  Comment: hysterectomy   Other Topics Concern  . Not on file   Social History Narrative   Married to [place]   2 sons- 2001 Kasandra Knudsen and  alex (1999)   Has dog   She is unemployed, has worked in the past (office work)   Completed some college   Enjoys Theatre manager     Review of Systems: General: negative for chills, fever, night sweats or weight changes.  Cardiovascular: negative for chest pain, dyspnea on exertion, edema, orthopnea, palpitations, paroxysmal nocturnal dyspnea or shortness of breath Dermatological: negative for rash Respiratory: negative for cough or wheezing Urologic: negative for hematuria Abdominal: negative for nausea, vomiting, diarrhea, bright red blood per rectum, melena, or hematemesis Neurologic: negative for visual changes, syncope, or dizziness All other systems reviewed and  are otherwise negative except as noted above.    Blood pressure 142/86, pulse 83, height 4\' 11"  (1.499 m), weight 213 lb 1.6 oz (96.662 kg).  General appearance: alert and no distress Neck: no adenopathy, no carotid bruit, no JVD, supple, symmetrical, trachea midline and thyroid not enlarged, symmetric, no tenderness/mass/nodules Lungs: clear to auscultation bilaterally Heart: regular rate and rhythm, S1, S2 normal, no murmur, click, rub or gallop Extremities: extremities normal, atraumatic, no cyanosis or edema  EKG normal sinus rhythm at 83 without ST or T wave changes  ASSESSMENT AND PLAN:   Palpitations The patient has neuro-carcinoid syndrome. She's been treated with a medication for that. This is metastatic and she said [facility]. She does get palpitations that occur monthly which she thinks are related to administration of a drug called Octreotide . She also has been orthostatic. 2-D echo was performed the results of which are unknown to me at this time.I am going to get on a monitor I will see her back after that for further evaluation.  Hypertension Controlled on current medications      Lorretta Harp MD [facility], [facility] 04/22/2013 5:00 PM

## 2013-04-22 NOTE — Patient Instructions (Addendum)
Your physician has recommended that you wear an event monitor for 30 days. Event monitors are medical devices that record the heart's electrical activity. Doctors most often [place] these monitors to diagnose arrhythmias. Arrhythmias are problems with the speed or rhythm of the heartbeat. The monitor is a small, portable device. You can wear one while you do your normal daily activities. This is usually used to diagnose what is causing palpitations.  Dr Gwenlyn Found recommends that you schedule a follow-up appointment after you have completed the 30-day event monitor.

## 2013-04-22 NOTE — Assessment & Plan Note (Signed)
Controlled on current medications 

## 2013-04-22 NOTE — Assessment & Plan Note (Addendum)
The patient has neuro-carcinoid syndrome. She's been treated with a medication for that. This is metastatic and she said a partial [facility]. She does get palpitations that occur monthly which she thinks are related to administration of a drug called Octreotide .apparently that drug is associated in 5-10% of cases with palpitations and arrhythmias. She also has been orthostatic. 2-D echo was performed the results of which are unknown to me at this time.I am going to get on a monitor I will see her back after that for further evaluation.

## 2013-05-03 ENCOUNTER — Ambulatory Visit (HOSPITAL_BASED_OUTPATIENT_CLINIC_OR_DEPARTMENT_OTHER): Payer: BC Managed Care – PPO | Admitting: Hematology & Oncology

## 2013-05-03 ENCOUNTER — Ambulatory Visit (HOSPITAL_BASED_OUTPATIENT_CLINIC_OR_DEPARTMENT_OTHER): Payer: BC Managed Care – PPO

## 2013-05-03 ENCOUNTER — Ambulatory Visit (HOSPITAL_BASED_OUTPATIENT_CLINIC_OR_DEPARTMENT_OTHER): Payer: BC Managed Care – PPO | Admitting: Lab

## 2013-05-03 DIAGNOSIS — C787 Secondary malignant neoplasm of liver and intrahepatic bile duct: Secondary | ICD-10-CM

## 2013-05-03 DIAGNOSIS — C7B8 Other secondary neuroendocrine tumors: Secondary | ICD-10-CM

## 2013-05-03 DIAGNOSIS — C7A00 Malignant carcinoid tumor of unspecified site: Secondary | ICD-10-CM

## 2013-05-03 LAB — CMP (CANCER CENTER ONLY)
ALBUMIN: 3.6 g/dL (ref 3.3–5.5)
ALT(SGPT): 22 U/L (ref 10–47)
AST: 23 U/L (ref 11–38)
Alkaline Phosphatase: 62 U/L (ref 26–84)
BUN, Bld: 17 mg/dL (ref 7–22)
CHLORIDE: 104 meq/L (ref 98–108)
CO2: 29 mEq/L (ref 18–33)
Calcium: 8.6 mg/dL (ref 8.0–10.3)
Creat: 0.6 mg/dl (ref 0.6–1.2)
GLUCOSE: 112 mg/dL (ref 73–118)
POTASSIUM: 3.4 meq/L (ref 3.3–4.7)
SODIUM: 139 meq/L (ref 128–145)
TOTAL PROTEIN: 6.9 g/dL (ref 6.4–8.1)
Total Bilirubin: 0.9 mg/dl (ref 0.20–1.60)

## 2013-05-03 LAB — CBC WITH DIFFERENTIAL (CANCER CENTER ONLY)
BASO#: 0 10*3/uL (ref 0.0–0.2)
BASO%: 0.5 % (ref 0.0–2.0)
EOS%: 2.1 % (ref 0.0–7.0)
Eosinophils Absolute: 0.2 10*3/uL (ref 0.0–0.5)
HEMATOCRIT: 35.9 % (ref 34.8–46.6)
HGB: 12.2 g/dL (ref 11.6–15.9)
LYMPH#: 2.8 10*3/uL (ref 0.9–3.3)
LYMPH%: 34.3 % (ref 14.0–48.0)
MCH: 31 pg (ref 26.0–34.0)
MCHC: 34 g/dL (ref 32.0–36.0)
MCV: 91 fL (ref 81–101)
MONO#: 0.6 10*3/uL (ref 0.1–0.9)
MONO%: 6.9 % (ref 0.0–13.0)
NEUT%: 56.2 % (ref 39.6–80.0)
NEUTROS ABS: 4.5 10*3/uL (ref 1.5–6.5)
Platelets: 237 10*3/uL (ref 145–400)
RBC: 3.93 10*6/uL (ref 3.70–5.32)
RDW: 14 % (ref 11.1–15.7)
WBC: 8.1 10*3/uL (ref 3.9–10.0)

## 2013-05-03 MED ORDER — OCTREOTIDE ACETATE 30 MG IM KIT
30.0000 mg | PACK | Freq: Once | INTRAMUSCULAR | Status: AC
  Administered 2013-05-03: 30 mg via INTRAMUSCULAR

## 2013-05-03 NOTE — Progress Notes (Signed)
Hematology and Oncology Follow Up Visit  Leah [name] [id number] 1960/06/26 53 y.o. 05/03/2013   Principle Diagnosis:   Metastatic neuroendocrine carcinoma-carcinoid  Current Therapy:    Sandostatin LAR 30 mg IM every 2 week     Interim History:  Leah [name] is back for followup. Of course he should have his cardiac issues. She's been seen by cardiology. She the Holter monitor on right now.  She was having some issues with fatigue. She's having some palpitations. She had been put on a diuretic which now has been stopped. She feels better.  She had an MRI yesterday. This was at [facility]. I try to get into the system but there is no record of her that I can find at [facility]. I noticed that after many a time I to see the surgeons.  She's had some diarrhea. This usually occurs about a day before she gets the octreotide. She's had no rashes. She's had no wheezing. She's had no cough. She does have the ocular issues. These have not worsened.  Her last chromogranin A  Was 3.4.  Medications: Current outpatient prescriptions:albuterol (PROVENTIL HFA;VENTOLIN HFA) 108 (90 BASE) MCG/ACT inhaler, Inhale 2 puffs into the lungs every 6 (six) hours as needed. For shortness of breath., Disp: , Rfl: ;  amLODipine (NORVASC) 10 MG tablet, Take 1 tablet (10 mg total) by mouth daily., Disp: 30 tablet, Rfl: 1;  [employer]S ALLERGY RELIEF 180 MG tablet, TAKE 1 TABLET BY MOUTH EVERY DAY, Disp: 30 tablet, Rfl: 10 Diclofenac Sodium (PENNSAID) 1.5 % SOLN, Place 40 drops onto the skin 4 (four) times daily as needed. For joint pain., Disp: , Rfl: ;  dorzolamide-timolol (COSOPT) 22.3-6.8 MG/ML ophthalmic solution, Place 1 drop into the right eye 2 (two) times daily., Disp: , Rfl: ;  metroNIDAZOLE (METROCREAM) 0.75 % cream, Apply 1 application topically as needed. , Disp: , Rfl:  montelukast (SINGULAIR) 10 MG tablet, TAKE 1 TABLET AT BEDTIME, Disp: 90 tablet, Rfl: 2;  Multiple Vitamin (MULTI VITAMIN DAILY PO), Take by  mouth every morning., Disp: , Rfl: ;  naproxen (NAPROSYN) 500 MG tablet, Take 1 tablet by mouth 2 (two) times daily., Disp: , Rfl: ;  octreotide (SANDOSTATIN LAR) 30 MG injection, Inject 30 mg into the muscle every 14 (fourteen) days., Disp: , Rfl:  ondansetron (ZOFRAN) 4 MG tablet, Take 4 mg by mouth every 8 (eight) hours as needed. For nausea., Disp: , Rfl: ;  Ospemifene (OSPHENA) 60 MG TABS, Take 1 tablet by mouth daily., Disp: 90 tablet, Rfl: 3;  pantoprazole (PROTONIX) 40 MG tablet, Take 40 mg by mouth 2 (two) times daily., Disp: , Rfl: ;  triamcinolone cream (KENALOG) 0.1 %, APPLY ON RASH THREE TIMES A DAY as needed, Disp: , Rfl:  valACYclovir (VALTREX) 500 MG tablet, TAKE 1 TABLET TWO TIMES A DAY as needed., Disp: , Rfl: ;  valsartan (DIOVAN) 160 MG tablet, Take 1 tablet (160 mg total) by mouth daily., Disp: 90 tablet, Rfl: 3;  venlafaxine XR (EFFEXOR-XR) 150 MG 24 hr capsule, Take 1 capsule (150 mg total) by mouth daily., Disp: 90 capsule, Rfl: 3  Allergies: No Known Allergies  Past Medical History, Surgical history, Social history, and Family History were reviewed and updated.  Review of Systems: As above  Physical Exam:  vitals were not taken for this visit.  Somewhat obese white female. Her weight is 210 pounds. Blood pressure 132/77. Pulse is 87. Temperature 97.8. Head and neck exam shows no ocular or oral lesions. No  mucositis noted. No adenopathy is appreciated. Lungs are clear. No wheezes are noted. Cardiac exam regular rate and rhythm with no murmurs rubs or bruits. Abdomen soft. She is somewhat obese. There is no fluid wave. There is no palpable hepato- splenomegaly. Back exam shows no tenderness over the spine ribs or hips. Extremities shows no clubbing cyanosis or edema. Skin exam no rashes. Neurological exam no focal deficits.  Lab Results  Component Value Date   WBC 8.1 05/03/2013   HGB 12.2 05/03/2013   HCT 35.9 05/03/2013   MCV 91 05/03/2013   PLT 237 05/03/2013      Chemistry      Component Value Date/Time   NA 139 05/03/2013 0951   NA 139 04/08/2013 1644   K 3.4 05/03/2013 0951   K 3.9 04/08/2013 1644   CL 104 05/03/2013 0951   CL 100 04/08/2013 1644   CO2 29 05/03/2013 0951   CO2 27 04/08/2013 1644   BUN 17 05/03/2013 0951   BUN 25* 04/08/2013 1644   CREATININE 0.6 05/03/2013 0951   CREATININE 0.60 10/06/2012 1926      Component Value Date/Time   CALCIUM 8.6 05/03/2013 0951   CALCIUM 9.5 04/08/2013 1644   ALKPHOS 62 05/03/2013 0951   ALKPHOS 71 10/06/2012 1916   AST 23 05/03/2013 0951   AST 19 10/06/2012 1916   ALT 22 05/03/2013 0951   ALT 15 10/06/2012 1916   BILITOT 0.90 05/03/2013 0951   BILITOT 0.2* 10/06/2012 1916         Impression and Plan: Leah [name] is 53 year old white female. She is metastatic carcinoid. It would really be nice to see the MRI. Hopefully it will come into our system.  From my point of view, the octreotide every 2 weeks is really helping her out.  We will continue the octreotide every 2 weeks. I will plan l to see her back myself in another 2 months.   Leah Napoleon, MD 3/24/201510:53 AM

## 2013-05-03 NOTE — Patient Instructions (Signed)

## 2013-05-07 ENCOUNTER — Encounter: Payer: Self-pay | Admitting: Hematology & Oncology

## 2013-05-07 LAB — CHROMOGRANIN A: Chromogranin A: 2.2 ng/mL (ref 1.9–15.0)

## 2013-05-07 LAB — LACTATE DEHYDROGENASE: LDH: 190 U/L (ref 94–250)

## 2013-05-17 ENCOUNTER — Ambulatory Visit (HOSPITAL_BASED_OUTPATIENT_CLINIC_OR_DEPARTMENT_OTHER): Payer: BC Managed Care – PPO

## 2013-05-17 VITALS — BP 132/82 | HR 80 | Temp 97.3°F | Resp 18

## 2013-05-17 DIAGNOSIS — C7B8 Other secondary neuroendocrine tumors: Secondary | ICD-10-CM

## 2013-05-17 DIAGNOSIS — C7A00 Malignant carcinoid tumor of unspecified site: Secondary | ICD-10-CM

## 2013-05-17 DIAGNOSIS — C787 Secondary malignant neoplasm of liver and intrahepatic bile duct: Secondary | ICD-10-CM

## 2013-05-17 MED ORDER — OCTREOTIDE ACETATE 30 MG IM KIT
PACK | INTRAMUSCULAR | Status: AC
  Filled 2013-05-17: qty 1

## 2013-05-17 MED ORDER — OCTREOTIDE ACETATE 30 MG IM KIT
30.0000 mg | PACK | Freq: Once | INTRAMUSCULAR | Status: AC
  Administered 2013-05-17: 30 mg via INTRAMUSCULAR

## 2013-05-17 NOTE — Patient Instructions (Signed)

## 2013-05-19 ENCOUNTER — Encounter: Payer: Self-pay | Admitting: Family

## 2013-05-20 MED ORDER — PANTOPRAZOLE SODIUM 40 MG PO TBEC: 40.0000 mg | DELAYED_RELEASE_TABLET | Freq: Two times a day (BID) | ORAL | Status: DC

## 2013-05-30 ENCOUNTER — Ambulatory Visit (INDEPENDENT_AMBULATORY_CARE_PROVIDER_SITE_OTHER): Payer: BC Managed Care – PPO | Admitting: Cardiovascular Disease

## 2013-05-30 ENCOUNTER — Encounter: Payer: Self-pay | Admitting: Cardiovascular Disease

## 2013-05-30 VITALS — BP 148/103 | HR 100 | Ht 59.5 in | Wt 212.0 lb

## 2013-05-30 DIAGNOSIS — R002 Palpitations: Secondary | ICD-10-CM

## 2013-05-30 DIAGNOSIS — I10 Essential (primary) hypertension: Secondary | ICD-10-CM

## 2013-05-30 NOTE — Patient Instructions (Signed)
Your physician recommends that you schedule a follow-up AS NEEDED with Dr.Berry

## 2013-05-30 NOTE — Progress Notes (Signed)
Leah [name] returns today for followup. I saw her 04/22/13 for orthostatic hypotension and palpitations. A one-month event monitor was entirely normal. Her diuretic was discontinued and since that time her symptoms have resolved. I will see her back on a when necessary basis.  Leah [name], M.D., [place], [facility], [place] [address] [place], [place]  [id number]  [phone] 05/30/2013 9:43 AM

## 2013-05-30 NOTE — Assessment & Plan Note (Signed)
Borderline controlled on current medications. She does admit to dietary indiscretion with regards to salt and we had a conversation regarding this.

## 2013-05-30 NOTE — Assessment & Plan Note (Signed)
Admit monitor was performed and showed sinus rhythm with no tachycardia or bradycardia arrhythmias. Since stopping her diuretic she no longer has palpitations or orthostatic symptoms.

## 2013-05-31 ENCOUNTER — Ambulatory Visit (HOSPITAL_BASED_OUTPATIENT_CLINIC_OR_DEPARTMENT_OTHER): Payer: BC Managed Care – PPO

## 2013-05-31 VITALS — BP 143/93 | HR 87 | Temp 97.8°F | Resp 16

## 2013-05-31 DIAGNOSIS — C7B8 Other secondary neuroendocrine tumors: Secondary | ICD-10-CM

## 2013-05-31 DIAGNOSIS — C787 Secondary malignant neoplasm of liver and intrahepatic bile duct: Secondary | ICD-10-CM

## 2013-05-31 DIAGNOSIS — C7A00 Malignant carcinoid tumor of unspecified site: Secondary | ICD-10-CM

## 2013-05-31 MED ORDER — OCTREOTIDE ACETATE 30 MG IM KIT
30.0000 mg | PACK | Freq: Once | INTRAMUSCULAR | Status: AC
  Administered 2013-05-31: 30 mg via INTRAMUSCULAR

## 2013-05-31 MED ORDER — OCTREOTIDE ACETATE 30 MG IM KIT
PACK | INTRAMUSCULAR | Status: AC
  Filled 2013-05-31: qty 1

## 2013-05-31 NOTE — Patient Instructions (Signed)

## 2013-06-14 ENCOUNTER — Ambulatory Visit (HOSPITAL_BASED_OUTPATIENT_CLINIC_OR_DEPARTMENT_OTHER): Payer: BC Managed Care – PPO

## 2013-06-14 VITALS — BP 140/96 | HR 94 | Temp 97.1°F | Resp 20

## 2013-06-14 DIAGNOSIS — C7B8 Other secondary neuroendocrine tumors: Secondary | ICD-10-CM

## 2013-06-14 DIAGNOSIS — C787 Secondary malignant neoplasm of liver and intrahepatic bile duct: Secondary | ICD-10-CM

## 2013-06-14 DIAGNOSIS — C7A00 Malignant carcinoid tumor of unspecified site: Secondary | ICD-10-CM

## 2013-06-14 MED ORDER — OCTREOTIDE ACETATE 30 MG IM KIT
30.0000 mg | PACK | Freq: Once | INTRAMUSCULAR | Status: AC
Start: 2013-06-14 — End: 2013-06-14
  Administered 2013-06-14: 30 mg via INTRAMUSCULAR

## 2013-06-14 NOTE — Patient Instructions (Signed)

## 2013-06-21 ENCOUNTER — Encounter: Payer: Self-pay | Admitting: Family

## 2013-06-21 ENCOUNTER — Ambulatory Visit (INDEPENDENT_AMBULATORY_CARE_PROVIDER_SITE_OTHER): Payer: BC Managed Care – PPO | Admitting: Family

## 2013-06-21 VITALS — BP 122/86 | HR 97 | Temp 98.2°F | Resp 16 | Ht 59.0 in | Wt 212.1 lb

## 2013-06-21 DIAGNOSIS — E119 Type 2 diabetes mellitus without complications: Secondary | ICD-10-CM

## 2013-06-21 DIAGNOSIS — R7309 Other abnormal glucose: Secondary | ICD-10-CM

## 2013-06-21 DIAGNOSIS — R7303 Prediabetes: Secondary | ICD-10-CM

## 2013-06-21 DIAGNOSIS — J309 Allergic rhinitis, unspecified: Secondary | ICD-10-CM

## 2013-06-21 DIAGNOSIS — I10 Essential (primary) hypertension: Secondary | ICD-10-CM

## 2013-06-21 LAB — BASIC METABOLIC PANEL
BUN: 15 mg/dL (ref 6–23)
CO2: 22 meq/L (ref 19–32)
CREATININE: 0.7 mg/dL (ref 0.50–1.10)
Calcium: 9 mg/dL (ref 8.4–10.5)
Chloride: 102 mEq/L (ref 96–112)
GLUCOSE: 193 mg/dL — AB (ref 70–99)
Potassium: 3.5 mEq/L (ref 3.5–5.3)
Sodium: 140 mEq/L (ref 135–145)

## 2013-06-21 LAB — HEMOGLOBIN A1C
Hgb A1c MFr Bld: 5.8 % — ABNORMAL HIGH (ref <5.7)
Mean Plasma Glucose: 120 mg/dL — ABNORMAL HIGH (ref <117)

## 2013-06-21 MED ORDER — PANTOPRAZOLE SODIUM 40 MG PO TBEC: 40.0000 mg | DELAYED_RELEASE_TABLET | Freq: Two times a day (BID) | ORAL | Status: DC

## 2013-06-21 NOTE — Assessment & Plan Note (Signed)
Stable on current meds.  Continue same. 

## 2013-06-21 NOTE — Patient Instructions (Signed)
Please complete lab work prior to leaving. Follow up in 4 months.  

## 2013-06-21 NOTE — Assessment & Plan Note (Signed)
Obtain A1C. 

## 2013-06-21 NOTE — Progress Notes (Signed)
Pre visit review using our clinic review tool, if applicable. No additional management support is needed unless otherwise documented below in the visit note. 

## 2013-06-21 NOTE — Assessment & Plan Note (Signed)
BP stable on current meds.  Continue amlodipine and diovan.

## 2013-06-21 NOTE — Progress Notes (Signed)
Subjective:    Patient ID: Leah [name], female    DOB: 1960-10-13, 53 y.o.   MRN: [id number]  HPI  Leah [name] is a 53 yr old female who presents today for follow up.  1) HTN- maintained on amlodipine and diovan.  She denies CP, sob or swelling BP Readings from Last 3 Encounters:  06/21/13 122/86  06/14/13 140/96  05/31/13 143/93   2) Borderline DM-  Lab Results  Component Value Date   HGBA1C 6.1* 03/14/2013   3)Allergic rhinitis-  Maintained on singulair, allegra and nasonex. Has had some snoring. Overall well controlled.   Review of Systems See HPI  Past Medical History  Diagnosis Date  . Sarcoidosis 2008  . Allergic rhinitis   . GE reflux   . Neuroendocrine tumor 2011  . Surgical menopause   . Migraines   . Atrophic vaginitis   . STD (sexually transmitted disease)     HSV I  . Hepatic encephalopathy 2011  . HTN (hypertension) 2011  . Carcinoid syndrome   . Borderline diabetes 03/17/2013  . Orthostatic hypotension   . Palpitations     History   Social History  . Marital Status: Married    Spouse Name: N/A    Number of Children: N/A  . Years of Education: N/A   Occupational History  . Not on file.   Social History Main Topics  . Smoking status: Never Smoker   . Smokeless tobacco: Never Used  . Alcohol Use: No     Comment: about 1x q 3 months  . Drug Use: No  . Sexual Activity: Yes    Birth Control/ Protection: Surgical     Comment: hysterectomy   Other Topics Concern  . Not on file   Social History Narrative   Married to [place]   2 sons- 2001 [name] (1999)   Has dog   She is unemployed, has worked in the past (office work)   Completed some college   Enjoys Theatre manager    Past Surgical History  Procedure Laterality Date  . Tonsillectomy  1976  . Cesarean section  [id number]  . Cholecystectomy  2006  . Abdominal hysterectomy  2004  . Oophorectomy  2004  . Mastectomy  2008    bilateral  . Bilateral mastectectomy  Bilateral 2008  . Latissimus dorsi graft  2008    used for reconstructive breast surgery  . Hepatetetomy    . Tubal ligation  2001    BTL  . Breast surgery      bilateral masectomy and reconstruction with silicone implants  . Knee arthroscopy Left 09/2010    Family History  Problem Relation Age of Onset  . Arthritis Maternal Grandmother   . Arthritis Maternal Grandfather   . Arthritis Paternal Grandmother   . Arthritis Paternal Grandfather   . Heart failure Paternal Grandfather   . Breast cancer Mother 59    died at 8  . Lupus Mother   . Heart failure Father   . Hypertension Father   . Hyperlipidemia Father   . Breast cancer Maternal Aunt 38    deceased at 80 recurrence  . Ovarian cancer Maternal Aunt 32     died with in 11 months  . Breast cancer Maternal Aunt 40    died at age 51    No Known Allergies  Current Outpatient Prescriptions on File Prior to Visit  Medication Sig Dispense Refill  . albuterol (PROVENTIL HFA;VENTOLIN HFA) 108 (90 BASE)  MCG/ACT inhaler Inhale 2 puffs into the lungs every 6 (six) hours as needed. For shortness of breath.      Marland Kitchen amLODipine (NORVASC) 10 MG tablet Take 1 tablet (10 mg total) by mouth daily.  30 tablet  1  . [facility]S ALLERGY RELIEF 180 MG tablet TAKE 1 TABLET BY MOUTH EVERY DAY  30 tablet  10  . Diclofenac Sodium (PENNSAID) 1.5 % SOLN Place 40 drops onto the skin 4 (four) times daily as needed. For joint pain.      . dorzolamide-timolol (COSOPT) 22.3-6.8 MG/ML ophthalmic solution Place 1 drop into the right eye 2 (two) times daily.      . metroNIDAZOLE (METROCREAM) 0.75 % cream Apply 1 application topically as needed.       . montelukast (SINGULAIR) 10 MG tablet TAKE 1 TABLET AT BEDTIME  90 tablet  2  . Multiple Vitamin (MULTI VITAMIN DAILY PO) Take by mouth every morning.      . naproxen (NAPROSYN) 500 MG tablet Take 1 tablet by mouth 2 (two) times daily.      Marland Kitchen octreotide (SANDOSTATIN LAR) 30 MG injection Inject 30 mg into the muscle  every 14 (fourteen) days.      . ondansetron (ZOFRAN) 4 MG tablet Take 4 mg by mouth every 8 (eight) hours as needed. For nausea.      . Ospemifene (OSPHENA) 60 MG TABS Take 1 tablet by mouth daily.  90 tablet  3  . pantoprazole (PROTONIX) 40 MG tablet Take 1 tablet (40 mg total) by mouth 2 (two) times daily.  60 tablet  3  . triamcinolone cream (KENALOG) 0.1 % APPLY ON RASH THREE TIMES A DAY as needed      . valACYclovir (VALTREX) 500 MG tablet TAKE 1 TABLET TWO TIMES A DAY as needed.      . valsartan (DIOVAN) 160 MG tablet Take 1 tablet (160 mg total) by mouth daily.  90 tablet  3  . venlafaxine XR (EFFEXOR-XR) 150 MG 24 hr capsule Take 1 capsule (150 mg total) by mouth daily.  90 capsule  3   No current facility-administered medications on file prior to visit.    BP 122/86  Pulse 97  Temp(Src) 98.2 F (36.8 C) (Oral)  Resp 16  Ht 4\' 11"  (1.499 m)  Wt 212 lb 1.3 oz (96.199 kg)  BMI 42.81 kg/m2  SpO2 97%       Objective:   Physical Exam  Constitutional: She is oriented to person, place, and time. She appears well-developed and well-nourished. No distress.  HENT:  Head: Normocephalic and atraumatic.  Cardiovascular: Normal rate and regular rhythm.   No murmur heard. Pulmonary/Chest: Effort normal and breath sounds normal. No respiratory distress. She has no wheezes. She has no rales. She exhibits no tenderness.  Musculoskeletal: She exhibits no edema.  Neurological: She is alert and oriented to person, place, and time.  Skin: Skin is warm and dry.  Psychiatric: She has a normal mood and affect. Her behavior is normal. Judgment and thought content normal.          Assessment & Plan:

## 2013-06-22 ENCOUNTER — Encounter: Payer: Self-pay | Admitting: Family

## 2013-06-29 ENCOUNTER — Other Ambulatory Visit (HOSPITAL_BASED_OUTPATIENT_CLINIC_OR_DEPARTMENT_OTHER): Payer: BC Managed Care – PPO | Admitting: Lab

## 2013-06-29 ENCOUNTER — Ambulatory Visit (HOSPITAL_BASED_OUTPATIENT_CLINIC_OR_DEPARTMENT_OTHER): Payer: BC Managed Care – PPO | Admitting: Hematology & Oncology

## 2013-06-29 ENCOUNTER — Ambulatory Visit (HOSPITAL_BASED_OUTPATIENT_CLINIC_OR_DEPARTMENT_OTHER): Payer: BC Managed Care – PPO

## 2013-06-29 ENCOUNTER — Encounter: Payer: Self-pay | Admitting: Hematology & Oncology

## 2013-06-29 VITALS — BP 137/87 | HR 92 | Temp 97.8°F | Resp 14 | Ht 59.0 in | Wt 214.0 lb

## 2013-06-29 DIAGNOSIS — C7A00 Malignant carcinoid tumor of unspecified site: Secondary | ICD-10-CM

## 2013-06-29 DIAGNOSIS — E3400 Carcinoid syndrome, unspecified: Secondary | ICD-10-CM

## 2013-06-29 DIAGNOSIS — C787 Secondary malignant neoplasm of liver and intrahepatic bile duct: Secondary | ICD-10-CM

## 2013-06-29 DIAGNOSIS — C7B8 Other secondary neuroendocrine tumors: Secondary | ICD-10-CM

## 2013-06-29 LAB — CMP (CANCER CENTER ONLY)
ALBUMIN: 3.3 g/dL (ref 3.3–5.5)
ALT(SGPT): 21 U/L (ref 10–47)
AST: 21 U/L (ref 11–38)
Alkaline Phosphatase: 68 U/L (ref 26–84)
BILIRUBIN TOTAL: 0.6 mg/dL (ref 0.20–1.60)
BUN: 15 mg/dL (ref 7–22)
CO2: 30 mEq/L (ref 18–33)
Calcium: 8.6 mg/dL (ref 8.0–10.3)
Chloride: 102 mEq/L (ref 98–108)
Creat: 0.6 mg/dl (ref 0.6–1.2)
Glucose, Bld: 118 mg/dL (ref 73–118)
POTASSIUM: 3.6 meq/L (ref 3.3–4.7)
Sodium: 142 mEq/L (ref 128–145)
Total Protein: 7.4 g/dL (ref 6.4–8.1)

## 2013-06-29 LAB — CBC WITH DIFFERENTIAL (CANCER CENTER ONLY)
BASO#: 0 10*3/uL (ref 0.0–0.2)
BASO%: 0.4 % (ref 0.0–2.0)
EOS%: 2.3 % (ref 0.0–7.0)
Eosinophils Absolute: 0.2 10*3/uL (ref 0.0–0.5)
HCT: 37.3 % (ref 34.8–46.6)
HGB: 12.5 g/dL (ref 11.6–15.9)
LYMPH#: 2.5 10*3/uL (ref 0.9–3.3)
LYMPH%: 30.7 % (ref 14.0–48.0)
MCH: 31.2 pg (ref 26.0–34.0)
MCHC: 33.5 g/dL (ref 32.0–36.0)
MCV: 93 fL (ref 81–101)
MONO#: 0.6 10*3/uL (ref 0.1–0.9)
MONO%: 7 % (ref 0.0–13.0)
NEUT%: 59.6 % (ref 39.6–80.0)
NEUTROS ABS: 4.9 10*3/uL (ref 1.5–6.5)
PLATELETS: 226 10*3/uL (ref 145–400)
RBC: 4.01 10*6/uL (ref 3.70–5.32)
RDW: 13.4 % (ref 11.1–15.7)
WBC: 8.2 10*3/uL (ref 3.9–10.0)

## 2013-06-29 MED ORDER — OCTREOTIDE ACETATE 30 MG IM KIT
30.0000 mg | PACK | Freq: Once | INTRAMUSCULAR | Status: AC
  Administered 2013-06-29: 30 mg via INTRAMUSCULAR

## 2013-06-29 MED ORDER — OCTREOTIDE ACETATE 30 MG IM KIT
PACK | INTRAMUSCULAR | Status: AC
  Filled 2013-06-29: qty 1

## 2013-06-29 NOTE — Patient Instructions (Signed)

## 2013-06-29 NOTE — Progress Notes (Signed)
Hematology and Oncology Follow Up Visit  Leah [name] [id number] 06-09-1960 53 y.o. 06/29/2013   Principle Diagnosis:  Metastatic neuroendocrine carcinoma-carcinoid  Current Therapy:   Sandostatin LAR 30 mg IM every 2 week     Interim History:  Leah [name] is back for followup. Last her back in March. Since then, she been doing fairly good. Patient had no problems with her heart since we last saw her. She had a Holter monitor on. This did not show any abnormal events. She saw her cardiologist. He went ahead and discontinued her diuretic. We are doing a 24 urine on her today.  Her last chromogranin A level was normal at 2.2.  She's had no wheezing. She's had no diarrhea. She's had no abdominal pain. Of note, she has had bilateral mastectomies. She does not need a mammogram.     Medications: Current outpatient prescriptions:albuterol (PROVENTIL HFA;VENTOLIN HFA) 108 (90 BASE) MCG/ACT inhaler, Inhale 2 puffs into the lungs every 6 (six) hours as needed. For shortness of breath., Disp: , Rfl: ;  amLODipine (NORVASC) 10 MG tablet, Take 1 tablet (10 mg total) by mouth daily., Disp: 30 tablet, Rfl: 1;  [employer]S ALLERGY RELIEF 180 MG tablet, TAKE 1 TABLET BY MOUTH EVERY DAY, Disp: 30 tablet, Rfl: 10 Diclofenac Sodium (PENNSAID) 1.5 % SOLN, Place 40 drops onto the skin 4 (four) times daily as needed. For joint pain., Disp: , Rfl: ;  dorzolamide-timolol (COSOPT) 22.3-6.8 MG/ML ophthalmic solution, Place 1 drop into the right eye 2 (two) times daily., Disp: , Rfl: ;  metroNIDAZOLE (METROCREAM) 0.75 % cream, Apply 1 application topically as needed. , Disp: , Rfl:  montelukast (SINGULAIR) 10 MG tablet, TAKE 1 TABLET AT BEDTIME, Disp: 90 tablet, Rfl: 2;  Multiple Vitamin (MULTI VITAMIN DAILY PO), Take by mouth every morning., Disp: , Rfl: ;  naproxen (NAPROSYN) 500 MG tablet, Take 1 tablet by mouth 2 (two) times daily., Disp: , Rfl: ;  octreotide (SANDOSTATIN LAR) 30 MG injection, Inject 30 mg into the muscle  every 14 (fourteen) days., Disp: , Rfl:  ondansetron (ZOFRAN) 4 MG tablet, Take 4 mg by mouth every 8 (eight) hours as needed. For nausea., Disp: , Rfl: ;  Ospemifene (OSPHENA) 60 MG TABS, Take 1 tablet by mouth daily., Disp: 90 tablet, Rfl: 3;  pantoprazole (PROTONIX) 40 MG tablet, Take 1 tablet (40 mg total) by mouth 2 (two) times daily., Disp: 60 tablet, Rfl: 3;  valACYclovir (VALTREX) 500 MG tablet, TAKE 1 TABLET TWO TIMES A DAY as needed., Disp: , Rfl:  valsartan (DIOVAN) 160 MG tablet, Take 1 tablet (160 mg total) by mouth daily., Disp: 90 tablet, Rfl: 3;  venlafaxine XR (EFFEXOR-XR) 150 MG 24 hr capsule, Take 1 capsule (150 mg total) by mouth daily., Disp: 90 capsule, Rfl: 3  Allergies: No Known Allergies  Past Medical History, Surgical history, Social history, and Family History were reviewed and updated.  Review of Systems: As above  Physical Exam:  height is 4\' 11"  (1.499 m) and weight is 214 lb (97.07 kg). Her oral temperature is 97.8 F (36.6 C). Her blood pressure is 137/87 and her pulse is 92. Her respiration is 14.   Obese white female. Lungs are clear. Cardiac exam regular rhythm. Abdomen soft patient multiple laparotomy scars. There is no fluid wave. There is a palpable abdominal mass. No palpable liver or spleen tip. Back exam no tenderness over the spine ribs or hips. Extremities shows no clubbing cyanosis or edema. Skin exam no rashes. Lymph  node exam shows no lymphadenopathy. Neurological exam is nonfocal.  Lab Results  Component Value Date   WBC 8.2 06/29/2013   HGB 12.5 06/29/2013   HCT 37.3 06/29/2013   MCV 93 06/29/2013   PLT 226 06/29/2013     Chemistry      Component Value Date/Time   NA 142 06/29/2013 0920   NA 140 06/21/2013 1014   K 3.6 06/29/2013 0920   K 3.5 06/21/2013 1014   CL 102 06/29/2013 0920   CL 102 06/21/2013 1014   CO2 30 06/29/2013 0920   CO2 22 06/21/2013 1014   BUN 15 06/29/2013 0920   BUN 15 06/21/2013 1014   CREATININE 0.6 06/29/2013 0920    CREATININE 0.60 10/06/2012 1926      Component Value Date/Time   CALCIUM 8.6 06/29/2013 0920   CALCIUM 9.0 06/21/2013 1014   ALKPHOS 68 06/29/2013 0920   ALKPHOS 71 10/06/2012 1916   AST 21 06/29/2013 0920   AST 19 10/06/2012 1916   ALT 21 06/29/2013 0920   ALT 15 10/06/2012 1916   BILITOT 0.60 06/29/2013 0920   BILITOT 0.2* 10/06/2012 1916         Impression and Plan: Leah [name] is 53 year old white female with metastatic neuroendocrine carcinoid. She is doing quite well from my point of view. Her disease is stable clinically.  She did have a MRI done in the past couple months. This was at [facility]. I am not sure what the results are. I would think that if there was a problem that we would of been told about it.  The Sandostatin every 2 weeks and working well for her.  We will continue the Sandostatin every 2 weeks. Her bowel plan to see her back in 2 more months.   [name], MD 5/20/201510:21 AM

## 2013-07-05 LAB — CHROMOGRANIN A: Chromogranin A: 4.4 ng/mL (ref 1.9–15.0)

## 2013-07-12 ENCOUNTER — Ambulatory Visit (HOSPITAL_BASED_OUTPATIENT_CLINIC_OR_DEPARTMENT_OTHER): Payer: BC Managed Care – PPO

## 2013-07-12 ENCOUNTER — Other Ambulatory Visit: Payer: BC Managed Care – PPO | Admitting: Lab

## 2013-07-12 VITALS — BP 150/91 | HR 108 | Temp 96.9°F | Resp 20

## 2013-07-12 DIAGNOSIS — C7A00 Malignant carcinoid tumor of unspecified site: Secondary | ICD-10-CM

## 2013-07-12 DIAGNOSIS — C7B8 Other secondary neuroendocrine tumors: Secondary | ICD-10-CM

## 2013-07-12 DIAGNOSIS — C787 Secondary malignant neoplasm of liver and intrahepatic bile duct: Secondary | ICD-10-CM

## 2013-07-12 MED ORDER — OCTREOTIDE ACETATE 30 MG IM KIT
PACK | INTRAMUSCULAR | Status: AC
  Filled 2013-07-12: qty 1

## 2013-07-12 MED ORDER — OCTREOTIDE ACETATE 30 MG IM KIT
30.0000 mg | PACK | Freq: Once | INTRAMUSCULAR | Status: AC
  Administered 2013-07-12: 30 mg via INTRAMUSCULAR

## 2013-07-12 NOTE — Patient Instructions (Signed)

## 2013-07-15 LAB — 5 HIAA, QUANTITATIVE, URINE, 24 HOUR
5-HIAA, URINE: 2 mg/(24.h) (ref <=6.0)
Volume, Urine-5HIAA: 900 mL/24 h

## 2013-07-18 ENCOUNTER — Encounter: Payer: Self-pay | Admitting: *Deleted

## 2013-07-25 ENCOUNTER — Other Ambulatory Visit: Payer: Self-pay | Admitting: Family

## 2013-07-25 ENCOUNTER — Other Ambulatory Visit: Payer: Self-pay | Admitting: Internal Medicine

## 2013-07-26 ENCOUNTER — Ambulatory Visit (HOSPITAL_BASED_OUTPATIENT_CLINIC_OR_DEPARTMENT_OTHER): Payer: BC Managed Care – PPO

## 2013-07-26 ENCOUNTER — Telehealth: Payer: Self-pay | Admitting: Hematology & Oncology

## 2013-07-26 VITALS — BP 137/91 | HR 102 | Temp 98.0°F | Resp 18

## 2013-07-26 DIAGNOSIS — C7B8 Other secondary neuroendocrine tumors: Secondary | ICD-10-CM

## 2013-07-26 DIAGNOSIS — C7A00 Malignant carcinoid tumor of unspecified site: Secondary | ICD-10-CM

## 2013-07-26 DIAGNOSIS — C787 Secondary malignant neoplasm of liver and intrahepatic bile duct: Secondary | ICD-10-CM

## 2013-07-26 MED ORDER — OCTREOTIDE ACETATE 30 MG IM KIT
PACK | INTRAMUSCULAR | Status: AC
  Filled 2013-07-26: qty 1

## 2013-07-26 MED ORDER — OCTREOTIDE ACETATE 30 MG IM KIT
30.0000 mg | PACK | Freq: Once | INTRAMUSCULAR | Status: AC
Start: 2013-07-26 — End: 2013-07-26
  Administered 2013-07-26: 30 mg via INTRAMUSCULAR

## 2013-07-26 NOTE — Patient Instructions (Signed)

## 2013-07-26 NOTE — Telephone Encounter (Signed)
[employer] [place] - NPR  D4709 PR OCTREOTIDE INJECTION, DEPOT  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 - Primary 209.72, 209.20

## 2013-08-09 ENCOUNTER — Ambulatory Visit (HOSPITAL_BASED_OUTPATIENT_CLINIC_OR_DEPARTMENT_OTHER): Payer: BC Managed Care – PPO

## 2013-08-09 VITALS — BP 146/99 | HR 95 | Temp 98.5°F | Resp 18

## 2013-08-09 DIAGNOSIS — C7B8 Other secondary neuroendocrine tumors: Secondary | ICD-10-CM

## 2013-08-09 DIAGNOSIS — C787 Secondary malignant neoplasm of liver and intrahepatic bile duct: Secondary | ICD-10-CM

## 2013-08-09 DIAGNOSIS — C7A00 Malignant carcinoid tumor of unspecified site: Secondary | ICD-10-CM

## 2013-08-09 MED ORDER — OCTREOTIDE ACETATE 30 MG IM KIT
PACK | INTRAMUSCULAR | Status: AC
  Filled 2013-08-09: qty 1

## 2013-08-09 MED ORDER — OCTREOTIDE ACETATE 30 MG IM KIT
30.0000 mg | PACK | Freq: Once | INTRAMUSCULAR | Status: AC
  Administered 2013-08-09: 30 mg via INTRAMUSCULAR

## 2013-08-09 NOTE — Patient Instructions (Signed)

## 2013-08-25 ENCOUNTER — Other Ambulatory Visit (HOSPITAL_BASED_OUTPATIENT_CLINIC_OR_DEPARTMENT_OTHER): Payer: BC Managed Care – PPO | Admitting: Lab

## 2013-08-25 ENCOUNTER — Encounter: Payer: Self-pay | Admitting: Hematology & Oncology

## 2013-08-25 ENCOUNTER — Ambulatory Visit (HOSPITAL_BASED_OUTPATIENT_CLINIC_OR_DEPARTMENT_OTHER): Payer: BC Managed Care – PPO

## 2013-08-25 ENCOUNTER — Telehealth: Payer: Self-pay | Admitting: Hematology & Oncology

## 2013-08-25 ENCOUNTER — Ambulatory Visit (HOSPITAL_BASED_OUTPATIENT_CLINIC_OR_DEPARTMENT_OTHER): Payer: BC Managed Care – PPO | Admitting: Hematology & Oncology

## 2013-08-25 VITALS — BP 136/84 | HR 99 | Temp 97.4°F | Resp 14 | Ht 59.0 in | Wt 216.0 lb

## 2013-08-25 DIAGNOSIS — C7A00 Malignant carcinoid tumor of unspecified site: Secondary | ICD-10-CM

## 2013-08-25 DIAGNOSIS — C787 Secondary malignant neoplasm of liver and intrahepatic bile duct: Secondary | ICD-10-CM

## 2013-08-25 DIAGNOSIS — C7B8 Other secondary neuroendocrine tumors: Secondary | ICD-10-CM

## 2013-08-25 DIAGNOSIS — R197 Diarrhea, unspecified: Secondary | ICD-10-CM

## 2013-08-25 LAB — CMP (CANCER CENTER ONLY)
ALT(SGPT): 20 U/L (ref 10–47)
AST: 22 U/L (ref 11–38)
Albumin: 3.3 g/dL (ref 3.3–5.5)
Alkaline Phosphatase: 72 U/L (ref 26–84)
BUN, Bld: 12 mg/dL (ref 7–22)
CO2: 28 mEq/L (ref 18–33)
Calcium: 8.5 mg/dL (ref 8.0–10.3)
Chloride: 100 mEq/L (ref 98–108)
Creat: 0.6 mg/dl (ref 0.6–1.2)
Glucose, Bld: 133 mg/dL — ABNORMAL HIGH (ref 73–118)
Potassium: 3.1 mEq/L — ABNORMAL LOW (ref 3.3–4.7)
Sodium: 141 mEq/L (ref 128–145)
Total Bilirubin: 0.7 mg/dl (ref 0.20–1.60)
Total Protein: 7.1 g/dL (ref 6.4–8.1)

## 2013-08-25 LAB — CBC WITH DIFFERENTIAL (CANCER CENTER ONLY)
BASO#: 0 10*3/uL (ref 0.0–0.2)
BASO%: 0.5 % (ref 0.0–2.0)
EOS%: 1.7 % (ref 0.0–7.0)
Eosinophils Absolute: 0.2 10*3/uL (ref 0.0–0.5)
HCT: 36.5 % (ref 34.8–46.6)
HGB: 12.3 g/dL (ref 11.6–15.9)
LYMPH#: 2.7 10*3/uL (ref 0.9–3.3)
LYMPH%: 31.8 % (ref 14.0–48.0)
MCH: 30.8 pg (ref 26.0–34.0)
MCHC: 33.7 g/dL (ref 32.0–36.0)
MCV: 91 fL (ref 81–101)
MONO#: 0.6 10*3/uL (ref 0.1–0.9)
MONO%: 6.5 % (ref 0.0–13.0)
NEUT%: 59.5 % (ref 39.6–80.0)
NEUTROS ABS: 5.1 10*3/uL (ref 1.5–6.5)
PLATELETS: 237 10*3/uL (ref 145–400)
RBC: 4 10*6/uL (ref 3.70–5.32)
RDW: 13.1 % (ref 11.1–15.7)
WBC: 8.6 10*3/uL (ref 3.9–10.0)

## 2013-08-25 MED ORDER — OCTREOTIDE ACETATE 30 MG IM KIT
PACK | INTRAMUSCULAR | Status: AC
  Filled 2013-08-25: qty 1

## 2013-08-25 MED ORDER — OCTREOTIDE ACETATE 30 MG IM KIT
30.0000 mg | PACK | Freq: Once | INTRAMUSCULAR | Status: AC
  Administered 2013-08-25: 30 mg via INTRAMUSCULAR

## 2013-08-25 NOTE — Telephone Encounter (Signed)
Left pt message with 7-30 appointment and to call if not ok

## 2013-08-25 NOTE — Progress Notes (Signed)
Hematology and Oncology Follow Up Visit  Leah [name] [id number] [birth year] 53 y.o. 08/25/2013   Principle Diagnosis:  Metastatic neuroendocrine carcinoma-carcinoid  Current Therapy:   Sandostatin LAR 30 mg IM every 2 week     Interim History:  Ms.  [name] is octreotide for followup. We had to delay her Sandostatin a couple days while I was out of the office. As such, she has had more the way of diarrhea.  We now have the new FDA approved to long-acting octreotide analog. This is a called lanreotide  (Somatuline). I think he might be worthwhile trying this to see if we can't get her back to a monthly schedule.  She had an MRI done at [name] in May. This did not show any growth of her carcinoid.  She's had no cough. There's been occasional wheezing. She does get tired easily. She's had no rashes. His been no leg swelling. She's had no nausea vomiting.   Her last chromogranin A level was 4.4 back in May.  A 24 hour urine for 5-HIAA was only 2.0 in June.    Medications: Current outpatient prescriptions:albuterol (PROVENTIL HFA;VENTOLIN HFA) 108 (90 BASE) MCG/ACT inhaler, Inhale 2 puffs into the lungs every 6 (six) hours as needed. For shortness of breath., Disp: , Rfl: ;  amLODipine (NORVASC) 10 MG tablet, TAKE 1 TABLET DAILY, Disp: 30 tablet, Rfl: 0;  [facility] ALLERGY RELIEF 180 MG tablet, TAKE 1 TABLET BY MOUTH EVERY DAY, Disp: 30 tablet, Rfl: 10 Diclofenac Sodium (PENNSAID) 1.5 % SOLN, Place 40 drops onto the skin 4 (four) times daily as needed. For joint pain., Disp: , Rfl: ;  dorzolamide-timolol (COSOPT) 22.3-6.8 MG/ML ophthalmic solution, Place 1 drop into the right eye 2 (two) times daily., Disp: , Rfl: ;  metroNIDAZOLE (METROCREAM) 0.75 % cream, Apply 1 application topically as needed. , Disp: , Rfl:  montelukast (SINGULAIR) 10 MG tablet, TAKE 1 TABLET AT BEDTIME, Disp: 90 tablet, Rfl: 2;  Multiple Vitamin (MULTI VITAMIN DAILY PO), Take by mouth every morning., Disp: , Rfl: ;   naproxen (NAPROSYN) 500 MG tablet, Take 1 tablet by mouth 2 (two) times daily., Disp: , Rfl: ;  octreotide (SANDOSTATIN LAR) 30 MG injection, Inject 30 mg into the muscle every 14 (fourteen) days., Disp: , Rfl:  ondansetron (ZOFRAN) 4 MG tablet, Take 4 mg by mouth every 8 (eight) hours as needed. For nausea., Disp: , Rfl: ;  Ospemifene (OSPHENA) 60 MG TABS, Take 1 tablet by mouth daily., Disp: 90 tablet, Rfl: 3;  pantoprazole (PROTONIX) 40 MG tablet, Take 1 tablet (40 mg total) by mouth 2 (two) times daily., Disp: 60 tablet, Rfl: 3;  triamcinolone cream (KENALOG) 0.1 %, APPLY ON RASH THREE TIMES A DAY, Disp: 30 g, Rfl: 2 valACYclovir (VALTREX) 500 MG tablet, TAKE 1 TABLET TWO TIMES A DAY as needed., Disp: , Rfl: ;  valsartan (DIOVAN) 160 MG tablet, Take 1 tablet (160 mg total) by mouth daily., Disp: 90 tablet, Rfl: 3;  venlafaxine XR (EFFEXOR-XR) 150 MG 24 hr capsule, Take 1 capsule (150 mg total) by mouth daily., Disp: 90 capsule, Rfl: 3  Allergies: No Known Allergies  Past Medical History, Surgical history, Social history, and Family History were reviewed and updated.  Review of Systems: As above  Physical Exam:  height is 4\' 11"  (1.499 m) and weight is 216 lb (97.977 kg). Her oral temperature is 97.4 F (36.3 C). Her blood pressure is 136/84 and her pulse is 99. Her respiration is 14.  Mildly obese white female. Head and neck exam shows no ocular or oral lesions. She has no palpable cervical or supraclavicular lymph nodes. Lungs are clear bilaterally. Cardiac exam regular rate and rhythm with no murmurs rubs or bruits. Abdomen is soft. She has good bowel sounds. There is no fluid wave. There is no palpable hepato- splenomegaly. Back exam no tenderness over the spine ribs or hips. Extremities shows no clubbing cyanosis or edema. Neurological exam shows no focal neurological deficits. Skin exam no rashes.  Lab Results  Component Value Date   WBC 8.6 08/25/2013   HGB 12.3 08/25/2013   HCT 36.5  08/25/2013   MCV 91 08/25/2013   PLT 237 08/25/2013     Chemistry      Component Value Date/Time   NA 141 08/25/2013 0932   NA 140 06/21/2013 1014   K 3.1* 08/25/2013 0932   K 3.5 06/21/2013 1014   CL 100 08/25/2013 0932   CL 102 06/21/2013 1014   CO2 28 08/25/2013 0932   CO2 22 06/21/2013 1014   BUN 12 08/25/2013 0932   BUN 15 06/21/2013 1014   CREATININE 0.6 08/25/2013 0932   CREATININE 0.60 10/06/2012 1926      Component Value Date/Time   CALCIUM 8.5 08/25/2013 0932   CALCIUM 9.0 06/21/2013 1014   ALKPHOS 72 08/25/2013 0932   ALKPHOS 71 10/06/2012 1916   AST 22 08/25/2013 0932   AST 19 10/06/2012 1916   ALT 20 08/25/2013 0932   ALT 15 10/06/2012 1916   BILITOT 0.70 08/25/2013 0932   BILITOT 0.2* 10/06/2012 1916         Impression and Plan: Leah [name] is a 53 year old white female with metastatic carcinoid. Again she has very stable. She is getting be octreotide every 2 weeks. This is becoming more of an issue. Hopefully, with the new and a log, we can get her to monthly.  I want to try to start this in 2 weeks. Of course, we have to get her insurance company to pay for it and this might be a problem says it is more expensive. However, she is becoming more resistant to the Sandostatin so I think it would be reasonable and clinically relevant to switch her therapy.  I will see her back in 6 months.  I spent a good half hour with she and her husband today.  [name], MD 7/16/20156:11 PM

## 2013-08-25 NOTE — Patient Instructions (Signed)

## 2013-08-30 ENCOUNTER — Ambulatory Visit: Payer: BC Managed Care – PPO | Admitting: Hematology & Oncology

## 2013-08-30 ENCOUNTER — Ambulatory Visit: Payer: BC Managed Care – PPO

## 2013-08-30 ENCOUNTER — Other Ambulatory Visit: Payer: BC Managed Care – PPO | Admitting: Lab

## 2013-08-30 LAB — CHROMOGRANIN A

## 2013-08-30 LAB — LACTATE DEHYDROGENASE: LDH: 184 U/L (ref 94–250)

## 2013-08-31 ENCOUNTER — Other Ambulatory Visit: Payer: Self-pay | Admitting: Obstetrics & Gynecology

## 2013-08-31 NOTE — Telephone Encounter (Signed)
eScribe request from [facility] for refill on OSPHENA Last filled - 09/07/12, #90 X 3 Last AEX - 07/15/12 Next AEX - not scheduled, pt is being seen at [facility] for Liver Cancer. Please advise refills.

## 2013-09-01 NOTE — Telephone Encounter (Signed)
I recommend office visit for any refills. I would decline this request.

## 2013-09-01 NOTE — Telephone Encounter (Signed)
Please evaluate for refill Leah [name]

## 2013-09-06 ENCOUNTER — Ambulatory Visit (HOSPITAL_BASED_OUTPATIENT_CLINIC_OR_DEPARTMENT_OTHER): Payer: BC Managed Care – PPO

## 2013-09-06 ENCOUNTER — Telehealth: Payer: Self-pay | Admitting: Hematology & Oncology

## 2013-09-06 VITALS — BP 138/92 | HR 98 | Temp 97.2°F | Resp 18

## 2013-09-06 DIAGNOSIS — C7A00 Malignant carcinoid tumor of unspecified site: Secondary | ICD-10-CM

## 2013-09-06 DIAGNOSIS — C7B8 Other secondary neuroendocrine tumors: Secondary | ICD-10-CM

## 2013-09-06 DIAGNOSIS — C787 Secondary malignant neoplasm of liver and intrahepatic bile duct: Secondary | ICD-10-CM

## 2013-09-06 MED ORDER — OCTREOTIDE ACETATE 30 MG IM KIT
PACK | INTRAMUSCULAR | Status: AC
  Filled 2013-09-06: qty 1

## 2013-09-06 MED ORDER — OCTREOTIDE ACETATE 30 MG IM KIT
30.0000 mg | PACK | Freq: Once | INTRAMUSCULAR | Status: AC
  Administered 2013-09-06: 30 mg via INTRAMUSCULAR

## 2013-09-06 NOTE — Patient Instructions (Signed)

## 2013-09-06 NOTE — Telephone Encounter (Signed)
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

## 2013-09-08 ENCOUNTER — Ambulatory Visit: Payer: BC Managed Care – PPO

## 2013-09-13 ENCOUNTER — Other Ambulatory Visit: Payer: Self-pay | Admitting: Physician Assistant

## 2013-09-16 ENCOUNTER — Telehealth: Payer: Self-pay | Admitting: Hematology & Oncology

## 2013-09-16 MED ORDER — AMLODIPINE BESYLATE 10 MG PO TABS: 10.0000 mg | ORAL_TABLET | Freq: Every day | ORAL | Status: DC

## 2013-09-16 NOTE — Telephone Encounter (Signed)
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

## 2013-09-20 ENCOUNTER — Ambulatory Visit: Payer: BC Managed Care – PPO

## 2013-09-21 ENCOUNTER — Ambulatory Visit (HOSPITAL_BASED_OUTPATIENT_CLINIC_OR_DEPARTMENT_OTHER): Payer: BC Managed Care – PPO

## 2013-09-21 ENCOUNTER — Ambulatory Visit: Payer: BC Managed Care – PPO | Admitting: Hematology & Oncology

## 2013-09-21 ENCOUNTER — Ambulatory Visit: Payer: BC Managed Care – PPO

## 2013-09-21 ENCOUNTER — Other Ambulatory Visit: Payer: BC Managed Care – PPO | Admitting: Lab

## 2013-09-21 VITALS — BP 155/95 | HR 105 | Temp 97.0°F | Resp 18

## 2013-09-21 DIAGNOSIS — C7B8 Other secondary neuroendocrine tumors: Secondary | ICD-10-CM

## 2013-09-21 DIAGNOSIS — C787 Secondary malignant neoplasm of liver and intrahepatic bile duct: Secondary | ICD-10-CM

## 2013-09-21 DIAGNOSIS — C7A00 Malignant carcinoid tumor of unspecified site: Secondary | ICD-10-CM

## 2013-09-21 MED ORDER — LANREOTIDE ACETATE 120 MG/0.5ML ~~LOC~~ SOLN
120.0000 mg | Freq: Once | SUBCUTANEOUS | Status: AC
  Administered 2013-09-21: 120 mg via SUBCUTANEOUS
  Filled 2013-09-21: qty 120

## 2013-09-21 NOTE — Patient Instructions (Signed)
Lanreotide injection What is this medicine? LANREOTIDE (lan REE oh tide) is used to reduce blood levels of growth hormone in patients with a condition called acromegaly. It also works to slow or stop tumor growth in patients with gastroenteropancreatic neuroendocrine tumor (GEP-NET). This medicine may be used for other purposes; ask your health care provider or pharmacist if you have questions. COMMON BRAND NAME(S): Somatuline Depot What should I tell my health care provider before I take this medicine? They need to know if you have any of these conditions: -diabetes -gallbladder disease -heart disease -kidney disease -liver disease -an unusual or allergic reaction to lanreotide, other medicines, latex, foods, dyes, or preservatives -pregnant or trying to get pregnant -breast-feeding How should I use this medicine? This medicine is for injection under the skin. It is given by a health care professional in a hospital or clinic setting. Contact your pediatrician or health care professional regarding the use of this medicine in children. Special care may be needed. Overdosage: If you think you have taken too much of this medicine contact a poison control center or emergency room at once. NOTE: This medicine is only for you. Do not share this medicine with others. What if I miss a dose? It is important not to miss your dose. Call your doctor or health care professional if you are unable to keep an appointment. What may interact with this medicine? -bromocriptine -cyclosporine -medicines for diabetes, including insulin -medicines for heart disease or hypertension -quinidine This list may not describe all possible interactions. Give your health care provider a list of all the medicines, herbs, non-prescription drugs, or dietary supplements you use. Also tell them if you smoke, drink alcohol, or use illegal drugs. Some items may interact with your medicine. What should I watch for while using  this medicine? Visit your doctor or health care professional for regular checks on your progress. Your condition will be monitored carefully while you are receiving this medicine. This medicine may cause increases or decreases in blood sugar. Signs of high blood sugar include frequent urination, unusual thirst, flushed or dry skin, difficulty breathing, drowsiness, stomach ache, nausea, vomiting or dry mouth. Signs of low blood sugar include chills, cool, pale skin or cold sweats, drowsiness, extreme hunger, fast heartbeat, headache, nausea, nervousness or anxiety, shakiness, trembling, unsteadiness, tiredness, or weakness. Contact your doctor or health care professional right away if you experience any of these symptoms. What side effects may I notice from receiving this medicine? Side effects that you should report to your doctor or health care professional as soon as possible: -allergic reactions like skin rash, itching or hives, swelling of the face, lips, or tongue -changes in blood sugar -changes in heart rate -severe stomach pain Side effects that usually do not require medical attention (report to your doctor or health care professional if they continue or are bothersome): -diarrhea or constipation -gas or stomach pain -nausea, vomiting -pain, redness, swelling and irritation at site where injected This list may not describe all possible side effects. Call your doctor for medical advice about side effects. You may report side effects to FDA at 1-800-FDA-1088. Where should I keep my medicine? This drug is given in a hospital or clinic and will not be stored at home. NOTE: This sheet is a summary. It may not cover all possible information. If you have questions about this medicine, talk to your doctor, pharmacist, or health care provider.  2015, Elsevier/Gold Standard. (2013-01-26 17:43:04)

## 2013-10-04 ENCOUNTER — Ambulatory Visit: Payer: BC Managed Care – PPO | Admitting: Hematology & Oncology

## 2013-10-04 ENCOUNTER — Ambulatory Visit: Payer: BC Managed Care – PPO

## 2013-10-04 ENCOUNTER — Other Ambulatory Visit: Payer: BC Managed Care – PPO | Admitting: Lab

## 2013-10-18 ENCOUNTER — Ambulatory Visit (HOSPITAL_BASED_OUTPATIENT_CLINIC_OR_DEPARTMENT_OTHER): Payer: BC Managed Care – PPO | Admitting: Hematology & Oncology

## 2013-10-18 ENCOUNTER — Other Ambulatory Visit (HOSPITAL_BASED_OUTPATIENT_CLINIC_OR_DEPARTMENT_OTHER): Payer: BC Managed Care – PPO | Admitting: Lab

## 2013-10-18 ENCOUNTER — Encounter: Payer: Self-pay | Admitting: Hematology & Oncology

## 2013-10-18 ENCOUNTER — Ambulatory Visit (HOSPITAL_BASED_OUTPATIENT_CLINIC_OR_DEPARTMENT_OTHER): Payer: BC Managed Care – PPO

## 2013-10-18 VITALS — BP 149/105 | HR 101 | Temp 98.1°F | Resp 16 | Ht 59.0 in | Wt 218.0 lb

## 2013-10-18 DIAGNOSIS — C787 Secondary malignant neoplasm of liver and intrahepatic bile duct: Secondary | ICD-10-CM

## 2013-10-18 DIAGNOSIS — C7B8 Other secondary neuroendocrine tumors: Secondary | ICD-10-CM

## 2013-10-18 DIAGNOSIS — C7A00 Malignant carcinoid tumor of unspecified site: Secondary | ICD-10-CM

## 2013-10-18 LAB — CMP (CANCER CENTER ONLY)
ALBUMIN: 3.2 g/dL — AB (ref 3.3–5.5)
ALT(SGPT): 13 U/L (ref 10–47)
AST: 21 U/L (ref 11–38)
Alkaline Phosphatase: 77 U/L (ref 26–84)
BILIRUBIN TOTAL: 0.7 mg/dL (ref 0.20–1.60)
BUN: 14 mg/dL (ref 7–22)
CO2: 26 mEq/L (ref 18–33)
Calcium: 8.7 mg/dL (ref 8.0–10.3)
Chloride: 104 mEq/L (ref 98–108)
Creat: 0.8 mg/dl (ref 0.6–1.2)
GLUCOSE: 119 mg/dL — AB (ref 73–118)
POTASSIUM: 3.5 meq/L (ref 3.3–4.7)
SODIUM: 136 meq/L (ref 128–145)
Total Protein: 7.1 g/dL (ref 6.4–8.1)

## 2013-10-18 LAB — CBC WITH DIFFERENTIAL (CANCER CENTER ONLY)
BASO#: 0 10*3/uL (ref 0.0–0.2)
BASO%: 0.5 % (ref 0.0–2.0)
EOS%: 1.9 % (ref 0.0–7.0)
Eosinophils Absolute: 0.2 10*3/uL (ref 0.0–0.5)
HCT: 38.2 % (ref 34.8–46.6)
HGB: 12.7 g/dL (ref 11.6–15.9)
LYMPH#: 3.3 10*3/uL (ref 0.9–3.3)
LYMPH%: 40.3 % (ref 14.0–48.0)
MCH: 30.3 pg (ref 26.0–34.0)
MCHC: 33.2 g/dL (ref 32.0–36.0)
MCV: 91 fL (ref 81–101)
MONO#: 0.7 10*3/uL (ref 0.1–0.9)
MONO%: 8 % (ref 0.0–13.0)
NEUT%: 49.3 % (ref 39.6–80.0)
NEUTROS ABS: 4.1 10*3/uL (ref 1.5–6.5)
Platelets: 235 10*3/uL (ref 145–400)
RBC: 4.19 10*6/uL (ref 3.70–5.32)
RDW: 13.7 % (ref 11.1–15.7)
WBC: 8.2 10*3/uL (ref 3.9–10.0)

## 2013-10-18 MED ORDER — LANREOTIDE ACETATE 120 MG/0.5ML ~~LOC~~ SOLN
120.0000 mg | Freq: Once | SUBCUTANEOUS | Status: AC
  Administered 2013-10-18: 120 mg via SUBCUTANEOUS
  Filled 2013-10-18: qty 120

## 2013-10-18 NOTE — Progress Notes (Signed)
Hematology and Oncology Follow Up Visit  Leah [name] [id number] 1960/11/29 53 y.o. 10/18/2013   Principle Diagnosis:  Metastatic neuroendocrine carcinoma-carcinoid  Current Therapy:   Somatuline 120 mg subcutaneous q. 4 weeks    Interim History:  Leah [name] is back for followup. She is doing okay. We switch over to the new long-acting somatostatin analog. She may have some slight decrease in her symptoms.  Her last chromogranin A level was less than 5. Para 3 admitted for her urine on her back in June. Her 5-HIAA level was 2.0. Patient still has some occasional diarrhea. She still has some nausea vomiting. Patient is back to [facility] in November. I think she is an MRI done at that time. She's had no flushing. His been no wheezing. She's had no weight loss weight gain. She's had no rashes.  She and her brother were going over to [place] in early October. I want to make sure that we give her medicine before she goes overdrew [place].  Medications: Current outpatient prescriptions:albuterol (PROVENTIL HFA;VENTOLIN HFA) 108 (90 BASE) MCG/ACT inhaler, Inhale 2 puffs into the lungs every 6 (six) hours as needed. For shortness of breath., Disp: , Rfl: ;  amLODipine (NORVASC) 10 MG tablet, Take 1 tablet (10 mg total) by mouth daily., Disp: 90 tablet, Rfl: 0;  [facility] ALLERGY RELIEF 180 MG tablet, TAKE 1 TABLET BY MOUTH EVERY DAY, Disp: 30 tablet, Rfl: 10 Diclofenac Sodium (PENNSAID) 1.5 % SOLN, Place 40 drops onto the skin 4 (four) times daily as needed. For joint pain., Disp: , Rfl: ;  dorzolamide-timolol (COSOPT) 22.3-6.8 MG/ML ophthalmic solution, Place 1 drop into the right eye 2 (two) times daily., Disp: , Rfl: ;  metroNIDAZOLE (METROCREAM) 0.75 % cream, Apply 1 application topically as needed. , Disp: , Rfl:  montelukast (SINGULAIR) 10 MG tablet, TAKE 1 TABLET AT BEDTIME, Disp: 90 tablet, Rfl: 2;  Multiple Vitamin (MULTI VITAMIN DAILY PO), Take by mouth every morning., Disp: , Rfl: ;   naproxen (NAPROSYN) 500 MG tablet, Take 1 tablet by mouth 2 (two) times daily., Disp: , Rfl: ;  octreotide (SANDOSTATIN LAR) 30 MG injection, Inject 30 mg into the muscle every 14 (fourteen) days., Disp: , Rfl:  ondansetron (ZOFRAN) 4 MG tablet, Take 4 mg by mouth every 8 (eight) hours as needed. For nausea., Disp: , Rfl: ;  Ospemifene (OSPHENA) 60 MG TABS, Take 1 tablet by mouth daily., Disp: 90 tablet, Rfl: 3;  pantoprazole (PROTONIX) 40 MG tablet, Take 1 tablet (40 mg total) by mouth 2 (two) times daily., Disp: 60 tablet, Rfl: 3;  triamcinolone cream (KENALOG) 0.1 %, APPLY ON RASH THREE TIMES A DAY, Disp: 30 g, Rfl: 2 valACYclovir (VALTREX) 500 MG tablet, TAKE 1 TABLET TWO TIMES A DAY as needed., Disp: , Rfl: ;  valsartan (DIOVAN) 160 MG tablet, Take 1 tablet (160 mg total) by mouth daily., Disp: 90 tablet, Rfl: 3;  venlafaxine XR (EFFEXOR-XR) 150 MG 24 hr capsule, Take 1 capsule (150 mg total) by mouth daily., Disp: 90 capsule, Rfl: 3  Allergies: No Known Allergies  Past Medical History, Surgical history, Social history, and Family History were reviewed and updated.  Review of Systems: As above  Physical Exam:  height is 4\' 11"  (1.499 m) and weight is 218 lb (98.884 kg). Her oral temperature is 98.1 F (36.7 C). Her blood pressure is 149/105 and her pulse is 101. Her respiration is 16.   Mildly obese white female. Head and neck exam shows no ocular  or oral lesions. She has no palpable cervical or supraclavicular lymph nodes. Lungs are clear bilaterally. Cardiac exam regular rate and rhythm with no murmurs rubs or bruits. Abdomen is soft. She has good bowel sounds. There is no fluid wave. There is no palpable hepato- splenomegaly. Back exam no tenderness over the spine ribs or hips. Extremities shows no clubbing cyanosis or edema. Neurological exam shows no focal neurological deficits. Skin exam no rashes.  Lab Results  Component Value Date   WBC 8.2 10/18/2013   HGB 12.7 10/18/2013   HCT 38.2  10/18/2013   MCV 91 10/18/2013   PLT 235 10/18/2013     Chemistry      Component Value Date/Time   NA 136 10/18/2013 0842   NA 140 06/21/2013 1014   K 3.5 10/18/2013 0842   K 3.5 06/21/2013 1014   CL 104 10/18/2013 0842   CL 102 06/21/2013 1014   CO2 26 10/18/2013 0842   CO2 22 06/21/2013 1014   BUN 14 10/18/2013 0842   BUN 15 06/21/2013 1014   CREATININE 0.8 10/18/2013 0842   CREATININE 0.60 10/06/2012 1926      Component Value Date/Time   CALCIUM 8.7 10/18/2013 0842   CALCIUM 9.0 06/21/2013 1014   ALKPHOS 77 10/18/2013 0842   ALKPHOS 71 10/06/2012 1916   AST 21 10/18/2013 0842   AST 19 10/06/2012 1916   ALT 13 10/18/2013 0842   ALT 15 10/06/2012 1916   BILITOT 0.70 10/18/2013 0842   BILITOT 0.2* 10/06/2012 1916         Impression and Plan: Leah [name] is 53 year old white female with metastatic carcinoid. She's on some . She's done well with this. Her symptoms are pray well controlled.  We will go ahead with a dose today.  We let [facility] do all of her x-rays. Again she was there in November for followup and an MRI.   We'll have her come back here in early October.   [name], MD 9/8/20159:50 AM

## 2013-10-18 NOTE — Patient Instructions (Signed)
Lanreotide injection What is this medicine? LANREOTIDE (lan REE oh tide) is used to reduce blood levels of growth hormone in patients with a condition called acromegaly. It also works to slow or stop tumor growth in patients with gastroenteropancreatic neuroendocrine tumor (GEP-NET). This medicine may be used for other purposes; ask your health care provider or pharmacist if you have questions. COMMON BRAND NAME(S): Somatuline Depot What should I tell my health care provider before I take this medicine? They need to know if you have any of these conditions: -diabetes -gallbladder disease -heart disease -kidney disease -liver disease -an unusual or allergic reaction to lanreotide, other medicines, latex, foods, dyes, or preservatives -pregnant or trying to get pregnant -breast-feeding How should I use this medicine? This medicine is for injection under the skin. It is given by a health care professional in a hospital or clinic setting. Contact your pediatrician or health care professional regarding the use of this medicine in children. Special care may be needed. Overdosage: If you think you have taken too much of this medicine contact a poison control center or emergency room at once. NOTE: This medicine is only for you. Do not share this medicine with others. What if I miss a dose? It is important not to miss your dose. Call your doctor or health care professional if you are unable to keep an appointment. What may interact with this medicine? -bromocriptine -cyclosporine -medicines for diabetes, including insulin -medicines for heart disease or hypertension -quinidine This list may not describe all possible interactions. Give your health care provider a list of all the medicines, herbs, non-prescription drugs, or dietary supplements you use. Also tell them if you smoke, drink alcohol, or use illegal drugs. Some items may interact with your medicine. What should I watch for while using  this medicine? Visit your doctor or health care professional for regular checks on your progress. Your condition will be monitored carefully while you are receiving this medicine. This medicine may cause increases or decreases in blood sugar. Signs of high blood sugar include frequent urination, unusual thirst, flushed or dry skin, difficulty breathing, drowsiness, stomach ache, nausea, vomiting or dry mouth. Signs of low blood sugar include chills, cool, pale skin or cold sweats, drowsiness, extreme hunger, fast heartbeat, headache, nausea, nervousness or anxiety, shakiness, trembling, unsteadiness, tiredness, or weakness. Contact your doctor or health care professional right away if you experience any of these symptoms. What side effects may I notice from receiving this medicine? Side effects that you should report to your doctor or health care professional as soon as possible: -allergic reactions like skin rash, itching or hives, swelling of the face, lips, or tongue -changes in blood sugar -changes in heart rate -severe stomach pain Side effects that usually do not require medical attention (report to your doctor or health care professional if they continue or are bothersome): -diarrhea or constipation -gas or stomach pain -nausea, vomiting -pain, redness, swelling and irritation at site where injected This list may not describe all possible side effects. Call your doctor for medical advice about side effects. You may report side effects to FDA at 1-800-FDA-1088. Where should I keep my medicine? This drug is given in a hospital or clinic and will not be stored at home. NOTE: This sheet is a summary. It may not cover all possible information. If you have questions about this medicine, talk to your doctor, pharmacist, or health care provider.  2015, Elsevier/Gold Standard. (2013-01-26 17:43:04)

## 2013-10-21 LAB — CHROMOGRANIN A: Chromogranin A: 22 ng/mL — ABNORMAL HIGH (ref <=15)

## 2013-10-25 ENCOUNTER — Ambulatory Visit (INDEPENDENT_AMBULATORY_CARE_PROVIDER_SITE_OTHER): Payer: BC Managed Care – PPO | Admitting: Family

## 2013-10-25 ENCOUNTER — Encounter: Payer: Self-pay | Admitting: Family

## 2013-10-25 VITALS — BP 170/114 | HR 107 | Temp 98.9°F | Resp 18 | Ht 59.0 in | Wt 219.8 lb

## 2013-10-25 DIAGNOSIS — J029 Acute pharyngitis, unspecified: Secondary | ICD-10-CM

## 2013-10-25 DIAGNOSIS — Z2089 Contact with and (suspected) exposure to other communicable diseases: Secondary | ICD-10-CM

## 2013-10-25 DIAGNOSIS — Z20818 Contact with and (suspected) exposure to other bacterial communicable diseases: Secondary | ICD-10-CM | POA: Insufficient documentation

## 2013-10-25 DIAGNOSIS — I10 Essential (primary) hypertension: Secondary | ICD-10-CM

## 2013-10-25 LAB — POCT RAPID STREP A (OFFICE): Rapid Strep A Screen: NEGATIVE

## 2013-10-25 MED ORDER — AMOXICILLIN 500 MG PO CAPS: 500.0000 mg | ORAL_CAPSULE | Freq: Three times a day (TID) | ORAL | Status: DC

## 2013-10-25 NOTE — Assessment & Plan Note (Signed)
Due to pain she only allows BP to be taken in the forearm. With manual cuff this is difficult to hear.  CMA reading 170/114 is inaccurate I believe. I palpated BP and palpated SBP of 140.  Repeat BP in office in 1 month, continue current meds.

## 2013-10-25 NOTE — Patient Instructions (Signed)
Please start amoxicillin.  Call if you develop fever >101.   Follow up in 1 month so we can repeat your blood pressure.

## 2013-10-25 NOTE — Assessment & Plan Note (Signed)
Rapid strep is negative, but had temp 102 on Saturday and known exposure. Rx empirically with amoxicillin.

## 2013-10-25 NOTE — Progress Notes (Signed)
Subjective:    Patient ID: Leah [name], female    DOB: 1960/05/28, 53 y.o.   MRN: [id number]  HPI  Leah [name] is a 53 yr old female who presents today with chief complaint of feeling sweaty and miserable on Saturday. Had temp on Saturday of 102.  Had some throat discomfort over the weekend. + fatigue.  One son has strep- confirmed yesterday.  Other son was sick with fever and has appointment today with his provider.   Review of Systems See HPI  Past Medical History  Diagnosis Date  . Sarcoidosis 2008  . Allergic rhinitis   . GE reflux   . Neuroendocrine tumor 2011  . Surgical menopause   . Migraines   . Atrophic vaginitis   . STD (sexually transmitted disease)     HSV I  . Hepatic encephalopathy 2011  . HTN (hypertension) 2011  . Carcinoid syndrome   . Borderline diabetes 03/17/2013  . Orthostatic hypotension   . Palpitations     History   Social History  . Marital Status: Married    Spouse Name: N/A    Number of Children: N/A  . Years of Education: N/A   Occupational History  . Not on file.   Social History Main Topics  . Smoking status: Never Smoker   . Smokeless tobacco: Never Used     Comment: never used tobacco  . Alcohol Use: No     Comment: about 1x q 3 months  . Drug Use: No  . Sexual Activity: Yes    Birth Control/ Protection: Surgical     Comment: hysterectomy   Other Topics Concern  . Not on file   Social History Narrative   Married to [place]   2 sons- 2001 [name] (1999)   Has dog   She is unemployed, has worked in the past (office work)   Completed some college   Enjoys Theatre manager    Past Surgical History  Procedure Laterality Date  . Tonsillectomy  1976  . Cesarean section  [id number]  . Cholecystectomy  2006  . Abdominal hysterectomy  2004  . Oophorectomy  2004  . Mastectomy  2008    bilateral  . Bilateral mastectectomy Bilateral 2008  . Latissimus dorsi graft  2008    used for reconstructive breast surgery    . Hepatetetomy    . Tubal ligation  2001    BTL  . Breast surgery      bilateral masectomy and reconstruction with silicone implants  . Knee arthroscopy Left 09/2010    Family History  Problem Relation Age of Onset  . Arthritis Maternal Grandmother   . Arthritis Maternal Grandfather   . Arthritis Paternal Grandmother   . Arthritis Paternal Grandfather   . Heart failure Paternal Grandfather   . Breast cancer Mother 63    died at 49  . Lupus Mother   . Heart failure Father   . Hypertension Father   . Hyperlipidemia Father   . Breast cancer Maternal Aunt 38    deceased at 80 recurrence  . Ovarian cancer Maternal Aunt 78     died with in 61 months  . Breast cancer Maternal Aunt 40    died at age 53    No Known Allergies  Current Outpatient Prescriptions on File Prior to Visit  Medication Sig Dispense Refill  . albuterol (PROVENTIL HFA;VENTOLIN HFA) 108 (90 BASE) MCG/ACT inhaler Inhale 2 puffs into the lungs every 6 (six) hours  as needed. For shortness of breath.      Marland Kitchen amLODipine (NORVASC) 10 MG tablet Take 1 tablet (10 mg total) by mouth daily.  90 tablet  0  . [facility] ALL[facility] RELIEF 180 MG tablet TAKE 1 TABLET BY MOUTH EVERY DAY  30 tablet  10  . Diclofenac Sodium (PENNSAID) 1.5 % SOLN Place 40 drops onto the skin 4 (four) times daily as needed. For joint pain.      . dorzolamide-timolol (COSOPT) 22.3-6.8 MG/ML ophthalmic solution Place 1 drop into the right eye 2 (two) times daily.      . metroNIDAZOLE (METROCREAM) 0.75 % cream Apply 1 application topically as needed.       . montelukast (SINGULAIR) 10 MG tablet TAKE 1 TABLET AT BEDTIME  90 tablet  2  . Multiple Vitamin (MULTI VITAMIN DAILY PO) Take by mouth every morning.      . naproxen (NAPROSYN) 500 MG tablet Take 1 tablet by mouth 2 (two) times daily.      Marland Kitchen octreotide (SANDOSTATIN LAR) 30 MG injection Inject 30 mg into the muscle every 14 (fourteen) days.      . ondansetron (ZOFRAN) 4 MG tablet Take 4 mg by mouth every 8  (eight) hours as needed. For nausea.      . Ospemifene (OSPHENA) 60 MG TABS Take 1 tablet by mouth daily.  90 tablet  3  . pantoprazole (PROTONIX) 40 MG tablet Take 1 tablet (40 mg total) by mouth 2 (two) times daily.  60 tablet  3  . triamcinolone cream (KENALOG) 0.1 % APPLY ON RASH THREE TIMES A DAY  30 g  2  . valACYclovir (VALTREX) 500 MG tablet TAKE 1 TABLET TWO TIMES A DAY as needed.      . valsartan (DIOVAN) 160 MG tablet Take 1 tablet (160 mg total) by mouth daily.  90 tablet  3  . venlafaxine XR (EFFEXOR-XR) 150 MG 24 hr capsule Take 1 capsule (150 mg total) by mouth daily.  90 capsule  3   No current facility-administered medications on file prior to visit.    BP 170/114  Pulse 107  Temp(Src) 98.9 F (37.2 C) (Oral)  Resp 18  Ht 4\' 11"  (1.499 m)  Wt 219 lb 12.8 oz (99.701 kg)  BMI 44.37 kg/m2  SpO2 96%       Objective:   Physical Exam  Constitutional: She is oriented to person, place, and time. She appears well-developed and well-nourished. No distress.  HENT:  Head: Normocephalic and atraumatic.  Right Ear: Tympanic membrane and ear canal normal.  Left Ear: Tympanic membrane and ear canal normal.  Mouth/Throat: Posterior oropharyngeal erythema present. No oropharyngeal exudate or posterior oropharyngeal edema.  Eyes: No scleral icterus.  Cardiovascular: Normal rate and regular rhythm.   No murmur heard. Pulmonary/Chest: Effort normal and breath sounds normal. No respiratory distress. She has no wheezes. She has no rales. She exhibits no tenderness.  Lymphadenopathy:    She has cervical adenopathy.  Neurological: She is alert and oriented to person, place, and time.  Psychiatric: She has a normal mood and affect. Her behavior is normal. Judgment and thought content normal.          Assessment & Plan:

## 2013-10-25 NOTE — Progress Notes (Signed)
Pre visit review using our clinic review tool, if applicable. No additional management support is needed unless otherwise documented below in the visit note. 

## 2013-11-04 ENCOUNTER — Telehealth: Payer: Self-pay | Admitting: Family

## 2013-11-04 DIAGNOSIS — K769 Liver disease, unspecified: Secondary | ICD-10-CM

## 2013-11-04 NOTE — Telephone Encounter (Signed)
Caller name: Ashanty Relation to pt: Call back number:[phone]   Reason for call:  Pt wants to have her ammonia levels checked.  States Lenna Sciara is aware of her health history and her levels spiking and dropping.

## 2013-11-05 NOTE — Telephone Encounter (Signed)
Order has been placed, pt needs to schedule lab visit pls.

## 2013-11-07 NOTE — Telephone Encounter (Signed)
Left message for pt to return my call.

## 2013-11-08 NOTE — Telephone Encounter (Signed)
Notified pt and scheduled lab appt for 11/14/13 at 10am.

## 2013-11-11 ENCOUNTER — Other Ambulatory Visit (HOSPITAL_BASED_OUTPATIENT_CLINIC_OR_DEPARTMENT_OTHER): Payer: BC Managed Care – PPO | Admitting: Lab

## 2013-11-11 ENCOUNTER — Ambulatory Visit (HOSPITAL_BASED_OUTPATIENT_CLINIC_OR_DEPARTMENT_OTHER): Payer: BC Managed Care – PPO | Admitting: Family

## 2013-11-11 ENCOUNTER — Ambulatory Visit (HOSPITAL_BASED_OUTPATIENT_CLINIC_OR_DEPARTMENT_OTHER): Payer: BC Managed Care – PPO

## 2013-11-11 ENCOUNTER — Encounter: Payer: Self-pay | Admitting: Family

## 2013-11-11 VITALS — BP 158/102 | HR 92 | Temp 98.1°F | Resp 20 | Ht 59.0 in | Wt 218.0 lb

## 2013-11-11 DIAGNOSIS — C7B02 Secondary carcinoid tumors of liver: Secondary | ICD-10-CM

## 2013-11-11 DIAGNOSIS — C7A00 Malignant carcinoid tumor of unspecified site: Secondary | ICD-10-CM

## 2013-11-11 DIAGNOSIS — C7B8 Other secondary neuroendocrine tumors: Secondary | ICD-10-CM

## 2013-11-11 DIAGNOSIS — C7A8 Other malignant neuroendocrine tumors: Secondary | ICD-10-CM

## 2013-11-11 DIAGNOSIS — Z23 Encounter for immunization: Secondary | ICD-10-CM

## 2013-11-11 LAB — CBC WITH DIFFERENTIAL (CANCER CENTER ONLY)
BASO#: 0 10*3/uL (ref 0.0–0.2)
BASO%: 0.3 % (ref 0.0–2.0)
EOS ABS: 0.2 10*3/uL (ref 0.0–0.5)
EOS%: 1.5 % (ref 0.0–7.0)
HCT: 38.6 % (ref 34.8–46.6)
HGB: 12.7 g/dL (ref 11.6–15.9)
LYMPH#: 3.2 10*3/uL (ref 0.9–3.3)
LYMPH%: 27.7 % (ref 14.0–48.0)
MCH: 30.1 pg (ref 26.0–34.0)
MCHC: 32.9 g/dL (ref 32.0–36.0)
MCV: 92 fL (ref 81–101)
MONO#: 0.8 10*3/uL (ref 0.1–0.9)
MONO%: 6.5 % (ref 0.0–13.0)
NEUT%: 64 % (ref 39.6–80.0)
NEUTROS ABS: 7.5 10*3/uL — AB (ref 1.5–6.5)
PLATELETS: 252 10*3/uL (ref 145–400)
RBC: 4.22 10*6/uL (ref 3.70–5.32)
RDW: 14 % (ref 11.1–15.7)
WBC: 11.7 10*3/uL — ABNORMAL HIGH (ref 3.9–10.0)

## 2013-11-11 LAB — CMP (CANCER CENTER ONLY)
ALK PHOS: 78 U/L (ref 26–84)
ALT: 23 U/L (ref 10–47)
AST: 17 U/L (ref 11–38)
Albumin: 3.3 g/dL (ref 3.3–5.5)
BILIRUBIN TOTAL: 0.7 mg/dL (ref 0.20–1.60)
BUN, Bld: 13 mg/dL (ref 7–22)
CO2: 26 meq/L (ref 18–33)
CREATININE: 0.7 mg/dL (ref 0.6–1.2)
Calcium: 8.3 mg/dL (ref 8.0–10.3)
Chloride: 102 mEq/L (ref 98–108)
Glucose, Bld: 116 mg/dL (ref 73–118)
Potassium: 3.6 mEq/L (ref 3.3–4.7)
SODIUM: 142 meq/L (ref 128–145)
Total Protein: 7 g/dL (ref 6.4–8.1)

## 2013-11-11 MED ORDER — LANREOTIDE ACETATE 120 MG/0.5ML ~~LOC~~ SOLN
120.0000 mg | Freq: Once | SUBCUTANEOUS | Status: AC
  Administered 2013-11-11: 120 mg via SUBCUTANEOUS
  Filled 2013-11-11: qty 120

## 2013-11-11 MED ORDER — INFLUENZA VAC SPLIT QUAD 0.5 ML IM SUSY
0.5000 mL | PREFILLED_SYRINGE | Freq: Once | INTRAMUSCULAR | Status: AC
  Administered 2013-11-11: 0.5 mL via INTRAMUSCULAR
  Filled 2013-11-11: qty 0.5

## 2013-11-11 NOTE — Progress Notes (Signed)
[facility]  [phone] [phone]  ID: Leah [name] OB: 06-19-1960 MR#: [id number]#:[id number] Patient Care Team: [name], NP as PCP - General (Internal Medicine)  DIAGNOSIS: Metastatic neuroendocrine carcinoma-carcinoid  INTERVAL HISTORY: Leah [name] is here today for follow-up and injection before going to [place] with her brother. She is doing well. She is still trying to get her BP under control. This is an ongoing issue and she was recently started on Norvasc. Hopefully this will help her.  She is doing ok on Somatuline. In September, her chromagranin was 22. In July, her 5-HIAA level was 2.0. She denies fever, chills, n/v, cough, rash, headache, dizziness, SOB, chest pain, palpitations, abdominal pain, constipation, diarrhea, blood in urine or stool. No swelling, tenderness, numbness or tingling in her extremities. She has had no bleeding or pain. Her appetite is good and she stays hydrated. Overall, she seems to be doing well.   CURRENT TREATMENT: Somatuline 120 mg subcutaneous q. 4 weeks   REVIEW OF SYSTEMS: All other 10 point review of systems is negative.   PAST MEDICAL HISTORY: Past Medical History  Diagnosis Date  . Sarcoidosis 2008  . Allergic rhinitis   . GE reflux   . Neuroendocrine tumor 2011  . Surgical menopause   . Migraines   . Atrophic vaginitis   . STD (sexually transmitted disease)     HSV I  . Hepatic encephalopathy 2011  . HTN (hypertension) 2011  . Carcinoid syndrome   . Borderline diabetes 03/17/2013  . Orthostatic hypotension   . Palpitations    PAST SURGICAL HISTORY: Past Surgical History  Procedure Laterality Date  . Tonsillectomy  1976  . Cesarean section  [id number]  . Cholecystectomy  2006  . Abdominal hysterectomy  2004  . Oophorectomy  2004  . Mastectomy  2008    bilateral  . Bilateral mastectectomy Bilateral 2008  . Latissimus dorsi graft  2008    used for reconstructive breast surgery   . Hepatetetomy    . Tubal ligation  2001    BTL  . Breast surgery      bilateral masectomy and reconstruction with silicone implants  . Knee arthroscopy Left 09/2010   FAMILY HISTORY Family History  Problem Relation Age of Onset  . Arthritis Maternal Grandmother   . Arthritis Maternal Grandfather   . Arthritis Paternal Grandmother   . Arthritis Paternal Grandfather   . Heart failure Paternal Grandfather   . Breast cancer Mother 36    died at 17  . Lupus Mother   . Heart failure Father   . Hypertension Father   . Hyperlipidemia Father   . Breast cancer Maternal Aunt 38    deceased at 97 recurrence  . Ovarian cancer Maternal Aunt 43     died with in 57 months  . Breast cancer Maternal Aunt 40    died at age 46   GYNECOLOGIC HISTORY:  No LMP recorded. Patient has had a hysterectomy.   SOCIAL HISTORY:  History   Social History  . Marital Status: Married    Spouse Name: N/A    Number of Children: N/A  . Years of Education: N/A   Occupational History  . Not on file.   Social History Main Topics  . Smoking status: Never Smoker   . Smokeless tobacco: Never Used     Comment: never used tobacco  . Alcohol Use: No     Comment: about 1x q 3 months  . Drug Use: No  .  Sexual Activity: Yes    Birth Control/ Protection: Surgical     Comment: hysterectomy   Other Topics Concern  . Not on file   Social History Narrative   Married to Spelter   2 sons- 2001 Kasandra Knudsen and  alex (1999)   Has dog   She is unemployed, has worked in the past (office work)   Completed some college   Enjoys Theatre manager   ADVANCED DIRECTIVES: <no information>  HEALTH MAINTENANCE: History  Substance Use Topics  . Smoking status: Never Smoker   . Smokeless tobacco: Never Used     Comment: never used tobacco  . Alcohol Use: No     Comment: about 1x q 3 months   Colonoscopy: PAP: Bone density: Lipid panel:  No Known Allergies  Current Outpatient Prescriptions  Medication Sig  Dispense Refill  . albuterol (PROVENTIL HFA;VENTOLIN HFA) 108 (90 BASE) MCG/ACT inhaler Inhale 2 puffs into the lungs every 6 (six) hours as needed. For shortness of breath.      Marland Kitchen amLODipine (NORVASC) 10 MG tablet Take 1 tablet (10 mg total) by mouth daily.  90 tablet  0  . amoxicillin (AMOXIL) 500 MG capsule Take 1 capsule (500 mg total) by mouth 3 (three) times daily.  30 capsule  0  . [facility] ALLERGY RELIEF 180 MG tablet TAKE 1 TABLET BY MOUTH EVERY DAY  30 tablet  10  . Diclofenac Sodium (PENNSAID) 1.5 % SOLN Place 40 drops onto the skin 4 (four) times daily as needed. For joint pain.      . dorzolamide-timolol (COSOPT) 22.3-6.8 MG/ML ophthalmic solution Place 1 drop into the right eye 2 (two) times daily.      . metroNIDAZOLE (METROCREAM) 0.75 % cream Apply 1 application topically as needed.       . montelukast (SINGULAIR) 10 MG tablet TAKE 1 TABLET AT BEDTIME  90 tablet  2  . Multiple Vitamin (MULTI VITAMIN DAILY PO) Take by mouth every morning.      . naproxen (NAPROSYN) 500 MG tablet Take 1 tablet by mouth 2 (two) times daily.      Marland Kitchen octreotide (SANDOSTATIN LAR) 30 MG injection Inject 30 mg into the muscle every 14 (fourteen) days.      . ondansetron (ZOFRAN) 4 MG tablet Take 4 mg by mouth every 8 (eight) hours as needed. For nausea.      . Ospemifene (OSPHENA) 60 MG TABS Take 1 tablet by mouth daily.  90 tablet  3  . pantoprazole (PROTONIX) 40 MG tablet Take 1 tablet (40 mg total) by mouth 2 (two) times daily.  60 tablet  3  . triamcinolone cream (KENALOG) 0.1 % APPLY ON RASH THREE TIMES A DAY  30 g  2  . valACYclovir (VALTREX) 500 MG tablet TAKE 1 TABLET TWO TIMES A DAY as needed.      . valsartan (DIOVAN) 160 MG tablet Take 1 tablet (160 mg total) by mouth daily.  90 tablet  3  . venlafaxine XR (EFFEXOR-XR) 150 MG 24 hr capsule Take 1 capsule (150 mg total) by mouth daily.  90 capsule  3   No current facility-administered medications for this visit.   OBJECTIVE: Filed Vitals:    11/11/13 1021  BP: 158/102  Pulse: 92  Temp: 98.1 F (36.7 C)  Resp: 20   Body mass index is 44.01 kg/(m^2). ECOG FS:0 - Asymptomatic Ocular: Sclerae unicteric, pupils equal, round and reactive to light Ear-nose-throat: Oropharynx clear, dentition fair Lymphatic: No cervical or  supraclavicular adenopathy Lungs no rales or rhonchi, good excursion bilaterally Heart regular rate and rhythm, no murmur appreciated Abd soft, nontender, positive bowel sounds MSK no focal spinal tenderness, no joint edema Neuro: non-focal, well-oriented, appropriate affect Breasts: Deferred  LAB RESULTS: CMP     Component Value Date/Time   NA 142 11/11/2013 0957   NA 140 06/21/2013 1014   K 3.6 11/11/2013 0957   K 3.5 06/21/2013 1014   CL 102 11/11/2013 0957   CL 102 06/21/2013 1014   CO2 26 11/11/2013 0957   CO2 22 06/21/2013 1014   GLUCOSE 116 11/11/2013 0957   GLUCOSE 193* 06/21/2013 1014   BUN 13 11/11/2013 0957   BUN 15 06/21/2013 1014   CREATININE 0.7 11/11/2013 0957   CREATININE 0.60 10/06/2012 1926   CALCIUM 8.3 11/11/2013 0957   CALCIUM 9.0 06/21/2013 1014   PROT 7.0 11/11/2013 0957   PROT 7.0 10/06/2012 1916   ALBUMIN 3.4* 10/06/2012 1916   AST 17 11/11/2013 0957   AST 19 10/06/2012 1916   ALT 23 11/11/2013 0957   ALT 15 10/06/2012 1916   ALKPHOS 78 11/11/2013 0957   ALKPHOS 71 10/06/2012 1916   BILITOT 0.70 11/11/2013 0957   BILITOT 0.2* 10/06/2012 1916   GFRNONAA >90 10/06/2012 1916   GFRAA >90 10/06/2012 1916   No results found for this basename: SPEP, UPEP,  kappa and lambda light chains   Lab Results  Component Value Date   WBC 11.7* 11/11/2013   NEUTROABS 7.5* 11/11/2013   HGB 12.7 11/11/2013   HCT 38.6 11/11/2013   MCV 92 11/11/2013   PLT 252 11/11/2013   No results found for this basename: LABCA2   No components found with this basename: LABCA125   No results found for this basename: INR,  in the last 168 hours  STUDIES: No results found.  ASSESSMENT/PLAN: Leah [name] is 53 year old white  female with metastatic carcinoid. She is doing ok on Somatuline and her symptoms are better.  She will have her injection today as planned.  We will give her a flu shot today as well.  Her CBC looked good today. We will see what the rest of her labs show.  We will go ahead with a dose today.  She has all her imaging done at [facility].  We will see her back in 2 months for labs and follow up.  She knows to call with any questions or concerns and to go to the ED in the event of an emergency. We can certainly see her sooner if need be.   Eliezer Bottom, NP 11/11/2013 4:34 PM

## 2013-11-11 NOTE — Patient Instructions (Signed)
Influenza Virus Vaccine injection What is this medicine? INFLUENZA VIRUS VACCINE (in floo EN zuh VAHY ruhs vak SEEN) helps to reduce the risk of getting influenza also known as the flu. The vaccine only helps protect you against some strains of the flu. This medicine may be used for other purposes; ask your health care provider or pharmacist if you have questions. COMMON BRAND NAME(S): Afluria, Agriflu, Fluarix, Fluarix Quadrivalent, FLUCELVAX, Flulaval, Fluvirin, Fluzone, Fluzone High-Dose, Fluzone Intradermal What should I tell my health care provider before I take this medicine? They need to know if you have any of these conditions: -bleeding disorder like hemophilia -fever or infection -Guillain-Barre syndrome or other neurological problems -immune system problems -infection with the human immunodeficiency virus (HIV) or AIDS -low blood platelet counts -multiple sclerosis -an unusual or allergic reaction to influenza virus vaccine, latex, other medicines, foods, dyes, or preservatives. Different brands of vaccines contain different allergens. Some may contain latex or eggs. Talk to your doctor about your allergies to make sure that you get the right vaccine. -pregnant or trying to get pregnant -breast-feeding How should I use this medicine? This vaccine is for injection into a muscle or under the skin. It is given by a health care professional. A copy of Vaccine Information Statements will be given before each vaccination. Read this sheet carefully each time. The sheet may change frequently. Talk to your healthcare provider to see which vaccines are right for you. Some vaccines should not be used in all age groups. Overdosage: If you think you have taken too much of this medicine contact a poison control center or emergency room at once. NOTE: This medicine is only for you. Do not share this medicine with others. What if I miss a dose? This does not apply. What may interact with this  medicine? -chemotherapy or radiation therapy -medicines that lower your immune system like etanercept, anakinra, infliximab, and adalimumab -medicines that treat or prevent blood clots like warfarin -phenytoin -steroid medicines like prednisone or cortisone -theophylline -vaccines This list may not describe all possible interactions. Give your health care provider a list of all the medicines, herbs, non-prescription drugs, or dietary supplements you use. Also tell them if you smoke, drink alcohol, or use illegal drugs. Some items may interact with your medicine. What should I watch for while using this medicine? Report any side effects that do not go away within 3 days to your doctor or health care professional. Call your health care provider if any unusual symptoms occur within 6 weeks of receiving this vaccine. You may still catch the flu, but the illness is not usually as bad. You cannot get the flu from the vaccine. The vaccine will not protect against colds or other illnesses that may cause fever. The vaccine is needed every year. What side effects may I notice from receiving this medicine? Side effects that you should report to your doctor or health care professional as soon as possible: -allergic reactions like skin rash, itching or hives, swelling of the face, lips, or tongue Side effects that usually do not require medical attention (report to your doctor or health care professional if they continue or are bothersome): -fever -headache -muscle aches and pains -pain, tenderness, redness, or swelling at the injection site -tiredness This list may not describe all possible side effects. Call your doctor for medical advice about side effects. You may report side effects to FDA at 1-800-FDA-1088. Where should I keep my medicine? The vaccine will be given by a health   care professional in a clinic, pharmacy, doctor's office, or other health care setting. You will not be given vaccine doses  to store at home. NOTE: This sheet is a summary. It may not cover all possible information. If you have questions about this medicine, talk to your doctor, pharmacist, or health care provider.  2015, Elsevier/Gold Standard. (2011-08-07 13:08:28) Lanreotide injection What is this medicine? LANREOTIDE (lan REE oh tide) is used to reduce blood levels of growth hormone in patients with a condition called acromegaly. It also works to slow or stop tumor growth in patients with gastroenteropancreatic neuroendocrine tumor (GEP-NET). This medicine may be used for other purposes; ask your health care provider or pharmacist if you have questions. COMMON BRAND NAME(S): Somatuline Depot What should I tell my health care provider before I take this medicine? They need to know if you have any of these conditions: -diabetes -gallbladder disease -heart disease -kidney disease -liver disease -an unusual or allergic reaction to lanreotide, other medicines, latex, foods, dyes, or preservatives -pregnant or trying to get pregnant -breast-feeding How should I use this medicine? This medicine is for injection under the skin. It is given by a health care professional in a hospital or clinic setting. Contact your pediatrician or health care professional regarding the use of this medicine in children. Special care may be needed. Overdosage: If you think you have taken too much of this medicine contact a poison control center or emergency room at once. NOTE: This medicine is only for you. Do not share this medicine with others. What if I miss a dose? It is important not to miss your dose. Call your doctor or health care professional if you are unable to keep an appointment. What may interact with this medicine? -bromocriptine -cyclosporine -medicines for diabetes, including insulin -medicines for heart disease or hypertension -quinidine This list may not describe all possible interactions. Give your health care  provider a list of all the medicines, herbs, non-prescription drugs, or dietary supplements you use. Also tell them if you smoke, drink alcohol, or use illegal drugs. Some items may interact with your medicine. What should I watch for while using this medicine? Visit your doctor or health care professional for regular checks on your progress. Your condition will be monitored carefully while you are receiving this medicine. This medicine may cause increases or decreases in blood sugar. Signs of high blood sugar include frequent urination, unusual thirst, flushed or dry skin, difficulty breathing, drowsiness, stomach ache, nausea, vomiting or dry mouth. Signs of low blood sugar include chills, cool, pale skin or cold sweats, drowsiness, extreme hunger, fast heartbeat, headache, nausea, nervousness or anxiety, shakiness, trembling, unsteadiness, tiredness, or weakness. Contact your doctor or health care professional right away if you experience any of these symptoms. What side effects may I notice from receiving this medicine? Side effects that you should report to your doctor or health care professional as soon as possible: -allergic reactions like skin rash, itching or hives, swelling of the face, lips, or tongue -changes in blood sugar -changes in heart rate -severe stomach pain Side effects that usually do not require medical attention (report to your doctor or health care professional if they continue or are bothersome): -diarrhea or constipation -gas or stomach pain -nausea, vomiting -pain, redness, swelling and irritation at site where injected This list may not describe all possible side effects. Call your doctor for medical advice about side effects. You may report side effects to FDA at 1-800-FDA-1088. Where should I keep my  medicine? This drug is given in a hospital or clinic and will not be stored at home. NOTE: This sheet is a summary. It may not cover all possible information. If you  have questions about this medicine, talk to your doctor, pharmacist, or health care provider.  2015, Elsevier/Gold Standard. (2013-01-26 17:43:04)

## 2013-11-14 ENCOUNTER — Ambulatory Visit: Payer: BC Managed Care – PPO

## 2013-11-14 ENCOUNTER — Other Ambulatory Visit: Payer: Self-pay | Admitting: Family Medicine

## 2013-11-14 MED ORDER — AMLODIPINE BESYLATE 10 MG PO TABS: 10.0000 mg | ORAL_TABLET | Freq: Every day | ORAL | Status: DC

## 2013-11-15 ENCOUNTER — Other Ambulatory Visit: Payer: BC Managed Care – PPO

## 2013-11-15 LAB — CHROMOGRANIN A: Chromogranin A: 5 ng/mL (ref <=15)

## 2013-11-17 ENCOUNTER — Encounter: Payer: Self-pay | Admitting: Nurse Practitioner

## 2013-11-26 ENCOUNTER — Other Ambulatory Visit: Payer: Self-pay | Admitting: Internal Medicine

## 2013-12-12 ENCOUNTER — Encounter: Payer: Self-pay | Admitting: Family

## 2013-12-13 ENCOUNTER — Ambulatory Visit (HOSPITAL_BASED_OUTPATIENT_CLINIC_OR_DEPARTMENT_OTHER): Payer: BC Managed Care – PPO

## 2013-12-13 ENCOUNTER — Other Ambulatory Visit: Payer: BC Managed Care – PPO | Admitting: Lab

## 2013-12-13 ENCOUNTER — Other Ambulatory Visit: Payer: Self-pay | Admitting: Family

## 2013-12-13 DIAGNOSIS — C7B8 Other secondary neuroendocrine tumors: Secondary | ICD-10-CM

## 2013-12-13 DIAGNOSIS — C7B02 Secondary carcinoid tumors of liver: Secondary | ICD-10-CM

## 2013-12-13 DIAGNOSIS — C7A00 Malignant carcinoid tumor of unspecified site: Secondary | ICD-10-CM

## 2013-12-13 LAB — CBC WITH DIFFERENTIAL (CANCER CENTER ONLY)
BASO#: 0 10*3/uL (ref 0.0–0.2)
BASO%: 0.2 % (ref 0.0–2.0)
EOS%: 1.6 % (ref 0.0–7.0)
Eosinophils Absolute: 0.1 10*3/uL (ref 0.0–0.5)
HCT: 37.9 % (ref 34.8–46.6)
HGB: 12.7 g/dL (ref 11.6–15.9)
LYMPH#: 2.6 10*3/uL (ref 0.9–3.3)
LYMPH%: 29.7 % (ref 14.0–48.0)
MCH: 30.3 pg (ref 26.0–34.0)
MCHC: 33.5 g/dL (ref 32.0–36.0)
MCV: 91 fL (ref 81–101)
MONO#: 0.7 10*3/uL (ref 0.1–0.9)
MONO%: 7.5 % (ref 0.0–13.0)
NEUT%: 61 % (ref 39.6–80.0)
NEUTROS ABS: 5.4 10*3/uL (ref 1.5–6.5)
Platelets: 253 10*3/uL (ref 145–400)
RBC: 4.19 10*6/uL (ref 3.70–5.32)
RDW: 13.9 % (ref 11.1–15.7)
WBC: 8.8 10*3/uL (ref 3.9–10.0)

## 2013-12-13 LAB — CMP (CANCER CENTER ONLY)
ALBUMIN: 3.4 g/dL (ref 3.3–5.5)
ALK PHOS: 85 U/L — AB (ref 26–84)
ALT(SGPT): 25 U/L (ref 10–47)
AST: 18 U/L (ref 11–38)
BUN, Bld: 15 mg/dL (ref 7–22)
CALCIUM: 8.7 mg/dL (ref 8.0–10.3)
CHLORIDE: 100 meq/L (ref 98–108)
CO2: 26 mEq/L (ref 18–33)
CREATININE: 0.5 mg/dL — AB (ref 0.6–1.2)
Glucose, Bld: 115 mg/dL (ref 73–118)
Potassium: 3.3 mEq/L (ref 3.3–4.7)
Sodium: 140 mEq/L (ref 128–145)
Total Bilirubin: 0.7 mg/dl (ref 0.20–1.60)
Total Protein: 7.2 g/dL (ref 6.4–8.1)

## 2013-12-13 MED ORDER — LANREOTIDE ACETATE 120 MG/0.5ML ~~LOC~~ SOLN
120.0000 mg | Freq: Once | SUBCUTANEOUS | Status: AC
  Administered 2013-12-13: 120 mg via SUBCUTANEOUS
  Filled 2013-12-13: qty 120

## 2013-12-13 NOTE — Patient Instructions (Signed)
Lanreotide injection What is this medicine? LANREOTIDE (lan REE oh tide) is used to reduce blood levels of growth hormone in patients with a condition called acromegaly. It also works to slow or stop tumor growth in patients with gastroenteropancreatic neuroendocrine tumor (GEP-NET). This medicine may be used for other purposes; ask your health care provider or pharmacist if you have questions. COMMON BRAND NAME(S): Somatuline Depot What should I tell my health care provider before I take this medicine? They need to know if you have any of these conditions: -diabetes -gallbladder disease -heart disease -kidney disease -liver disease -an unusual or allergic reaction to lanreotide, other medicines, latex, foods, dyes, or preservatives -pregnant or trying to get pregnant -breast-feeding How should I use this medicine? This medicine is for injection under the skin. It is given by a health care professional in a hospital or clinic setting. Contact your pediatrician or health care professional regarding the use of this medicine in children. Special care may be needed. Overdosage: If you think you have taken too much of this medicine contact a poison control center or emergency room at once. NOTE: This medicine is only for you. Do not share this medicine with others. What if I miss a dose? It is important not to miss your dose. Call your doctor or health care professional if you are unable to keep an appointment. What may interact with this medicine? -bromocriptine -cyclosporine -medicines for diabetes, including insulin -medicines for heart disease or hypertension -quinidine This list may not describe all possible interactions. Give your health care provider a list of all the medicines, herbs, non-prescription drugs, or dietary supplements you use. Also tell them if you smoke, drink alcohol, or use illegal drugs. Some items may interact with your medicine. What should I watch for while using  this medicine? Visit your doctor or health care professional for regular checks on your progress. Your condition will be monitored carefully while you are receiving this medicine. This medicine may cause increases or decreases in blood sugar. Signs of high blood sugar include frequent urination, unusual thirst, flushed or dry skin, difficulty breathing, drowsiness, stomach ache, nausea, vomiting or dry mouth. Signs of low blood sugar include chills, cool, pale skin or cold sweats, drowsiness, extreme hunger, fast heartbeat, headache, nausea, nervousness or anxiety, shakiness, trembling, unsteadiness, tiredness, or weakness. Contact your doctor or health care professional right away if you experience any of these symptoms. What side effects may I notice from receiving this medicine? Side effects that you should report to your doctor or health care professional as soon as possible: -allergic reactions like skin rash, itching or hives, swelling of the face, lips, or tongue -changes in blood sugar -changes in heart rate -severe stomach pain Side effects that usually do not require medical attention (report to your doctor or health care professional if they continue or are bothersome): -diarrhea or constipation -gas or stomach pain -nausea, vomiting -pain, redness, swelling and irritation at site where injected This list may not describe all possible side effects. Call your doctor for medical advice about side effects. You may report side effects to FDA at 1-800-FDA-1088. Where should I keep my medicine? This drug is given in a hospital or clinic and will not be stored at home. NOTE: This sheet is a summary. It may not cover all possible information. If you have questions about this medicine, talk to your doctor, pharmacist, or health care provider.  2015, Elsevier/Gold Standard. (2013-01-26 17:43:04)

## 2013-12-18 ENCOUNTER — Encounter: Payer: Self-pay | Admitting: Family

## 2013-12-19 ENCOUNTER — Telehealth: Payer: Self-pay | Admitting: Family

## 2013-12-19 LAB — CHROMOGRANIN A

## 2013-12-19 MED ORDER — VALSARTAN 160 MG PO TABS: 160.0000 mg | ORAL_TABLET | Freq: Every day | ORAL | Status: DC

## 2013-12-19 NOTE — Telephone Encounter (Signed)
Caller name: Candelaria, Pies Relation to pt: self  Call back number: [phone] [facility] [address], [place], [place] [address] [phone]  Reason for call:   Pt lost BP medication requesting a refill of valsartan (DIOVAN) 160 MG tablet

## 2013-12-20 ENCOUNTER — Ambulatory Visit: Payer: BC Managed Care – PPO | Admitting: Family

## 2013-12-20 ENCOUNTER — Encounter: Payer: Self-pay | Admitting: Family

## 2013-12-20 ENCOUNTER — Telehealth: Payer: Self-pay | Admitting: *Deleted

## 2013-12-20 NOTE — Telephone Encounter (Signed)
Please send no show letter

## 2013-12-20 NOTE — Telephone Encounter (Signed)
Pt did not show for appointment 12/20/2013 at 1:30pm for "Office Visit-My blood pressure has been high, and then on Saturday I ran out of Diovan.

## 2013-12-20 NOTE — Telephone Encounter (Signed)
Letter mailed

## 2013-12-24 ENCOUNTER — Other Ambulatory Visit: Payer: Self-pay | Admitting: Internal Medicine

## 2014-01-09 ENCOUNTER — Other Ambulatory Visit: Payer: Self-pay | Admitting: *Deleted

## 2014-01-09 DIAGNOSIS — C7B8 Other secondary neuroendocrine tumors: Secondary | ICD-10-CM

## 2014-01-10 ENCOUNTER — Encounter: Payer: Self-pay | Admitting: Hematology & Oncology

## 2014-01-10 ENCOUNTER — Ambulatory Visit (HOSPITAL_BASED_OUTPATIENT_CLINIC_OR_DEPARTMENT_OTHER): Payer: BC Managed Care – PPO | Admitting: Hematology & Oncology

## 2014-01-10 ENCOUNTER — Other Ambulatory Visit (HOSPITAL_BASED_OUTPATIENT_CLINIC_OR_DEPARTMENT_OTHER): Payer: BC Managed Care – PPO | Admitting: Lab

## 2014-01-10 ENCOUNTER — Ambulatory Visit (HOSPITAL_BASED_OUTPATIENT_CLINIC_OR_DEPARTMENT_OTHER): Payer: BC Managed Care – PPO

## 2014-01-10 VITALS — BP 115/78 | HR 100 | Temp 97.6°F | Resp 16 | Ht 59.0 in | Wt 224.0 lb

## 2014-01-10 DIAGNOSIS — C7B00 Secondary carcinoid tumors, unspecified site: Secondary | ICD-10-CM

## 2014-01-10 DIAGNOSIS — C7A00 Malignant carcinoid tumor of unspecified site: Secondary | ICD-10-CM

## 2014-01-10 DIAGNOSIS — Z5111 Encounter for antineoplastic chemotherapy: Secondary | ICD-10-CM

## 2014-01-10 DIAGNOSIS — C7B8 Other secondary neuroendocrine tumors: Secondary | ICD-10-CM

## 2014-01-10 DIAGNOSIS — J0101 Acute recurrent maxillary sinusitis: Secondary | ICD-10-CM

## 2014-01-10 LAB — CBC WITH DIFFERENTIAL (CANCER CENTER ONLY)
BASO#: 0 10*3/uL (ref 0.0–0.2)
BASO%: 0.4 % (ref 0.0–2.0)
EOS ABS: 0.2 10*3/uL (ref 0.0–0.5)
EOS%: 1.7 % (ref 0.0–7.0)
HEMATOCRIT: 38.2 % (ref 34.8–46.6)
HEMOGLOBIN: 12.5 g/dL (ref 11.6–15.9)
LYMPH#: 2.9 10*3/uL (ref 0.9–3.3)
LYMPH%: 27.7 % (ref 14.0–48.0)
MCH: 29.8 pg (ref 26.0–34.0)
MCHC: 32.7 g/dL (ref 32.0–36.0)
MCV: 91 fL (ref 81–101)
MONO#: 0.7 10*3/uL (ref 0.1–0.9)
MONO%: 6.5 % (ref 0.0–13.0)
NEUT#: 6.8 10*3/uL — ABNORMAL HIGH (ref 1.5–6.5)
NEUT%: 63.7 % (ref 39.6–80.0)
Platelets: 252 10*3/uL (ref 145–400)
RBC: 4.19 10*6/uL (ref 3.70–5.32)
RDW: 13.9 % (ref 11.1–15.7)
WBC: 10.6 10*3/uL — ABNORMAL HIGH (ref 3.9–10.0)

## 2014-01-10 MED ORDER — FLUTICASONE PROPIONATE 50 MCG/ACT NA SUSP: 2.0000 | Freq: Every day | NASAL | Status: DC

## 2014-01-10 MED ORDER — LANREOTIDE ACETATE 120 MG/0.5ML ~~LOC~~ SOLN
120.0000 mg | Freq: Once | SUBCUTANEOUS | Status: AC
  Administered 2014-01-10: 120 mg via SUBCUTANEOUS
  Filled 2014-01-10: qty 120

## 2014-01-10 MED ORDER — AZITHROMYCIN 250 MG PO TABS: ORAL_TABLET | ORAL | Status: DC

## 2014-01-10 NOTE — Addendum Note (Signed)
Addended by: Neysa Hotter on: 01/10/2014 10:40 AM   Modules accepted: Orders, Medications

## 2014-01-10 NOTE — Patient Instructions (Signed)
Lanreotide injection What is this medicine? LANREOTIDE (lan REE oh tide) is used to reduce blood levels of growth hormone in patients with a condition called acromegaly. It also works to slow or stop tumor growth in patients with gastroenteropancreatic neuroendocrine tumor (GEP-NET). This medicine may be used for other purposes; ask your health care provider or pharmacist if you have questions. COMMON BRAND NAME(S): Somatuline Depot What should I tell my health care provider before I take this medicine? They need to know if you have any of these conditions: -diabetes -gallbladder disease -heart disease -kidney disease -liver disease -an unusual or allergic reaction to lanreotide, other medicines, latex, foods, dyes, or preservatives -pregnant or trying to get pregnant -breast-feeding How should I use this medicine? This medicine is for injection under the skin. It is given by a health care professional in a hospital or clinic setting. Contact your pediatrician or health care professional regarding the use of this medicine in children. Special care may be needed. Overdosage: If you think you have taken too much of this medicine contact a poison control center or emergency room at once. NOTE: This medicine is only for you. Do not share this medicine with others. What if I miss a dose? It is important not to miss your dose. Call your doctor or health care professional if you are unable to keep an appointment. What may interact with this medicine? -bromocriptine -cyclosporine -medicines for diabetes, including insulin -medicines for heart disease or hypertension -quinidine This list may not describe all possible interactions. Give your health care provider a list of all the medicines, herbs, non-prescription drugs, or dietary supplements you use. Also tell them if you smoke, drink alcohol, or use illegal drugs. Some items may interact with your medicine. What should I watch for while using  this medicine? Visit your doctor or health care professional for regular checks on your progress. Your condition will be monitored carefully while you are receiving this medicine. This medicine may cause increases or decreases in blood sugar. Signs of high blood sugar include frequent urination, unusual thirst, flushed or dry skin, difficulty breathing, drowsiness, stomach ache, nausea, vomiting or dry mouth. Signs of low blood sugar include chills, cool, pale skin or cold sweats, drowsiness, extreme hunger, fast heartbeat, headache, nausea, nervousness or anxiety, shakiness, trembling, unsteadiness, tiredness, or weakness. Contact your doctor or health care professional right away if you experience any of these symptoms. What side effects may I notice from receiving this medicine? Side effects that you should report to your doctor or health care professional as soon as possible: -allergic reactions like skin rash, itching or hives, swelling of the face, lips, or tongue -changes in blood sugar -changes in heart rate -severe stomach pain Side effects that usually do not require medical attention (report to your doctor or health care professional if they continue or are bothersome): -diarrhea or constipation -gas or stomach pain -nausea, vomiting -pain, redness, swelling and irritation at site where injected This list may not describe all possible side effects. Call your doctor for medical advice about side effects. You may report side effects to FDA at 1-800-FDA-1088. Where should I keep my medicine? This drug is given in a hospital or clinic and will not be stored at home. NOTE: This sheet is a summary. It may not cover all possible information. If you have questions about this medicine, talk to your doctor, pharmacist, or health care provider.  2015, Elsevier/Gold Standard. (2013-01-26 17:43:04)

## 2014-01-11 NOTE — Progress Notes (Signed)
Hematology and Oncology Follow Up Visit  Leah [name] [id number] [birth year] 53 y.o. 01/11/2014   Principle Diagnosis:  Metastatic neuroendocrine carcinoma-carcinoid  Current Therapy:   Somatuline 120 mg subcutaneous q. 4 weeks    Interim History:  Ms.  Leah [name] is back for followup. She is doing okay. She had a very good time in [place]. She had some issues with her knee arthritis. She was over in [place] with some of her family. She really enjoyed itself. She did enjoy some good food.  She has a little bit of a sinus infection right now. She sounds very congested. I will go ahead and give her a Z-Pak and put her on some Flonase nasal spray.  She's not had any problems with diarrhea.  She's gained quite a bit await. I talked her about this. This certainly is not going to be good for her. Her last chromogranin A level back in early November was less than 5.   She last saw her surgeon at [facility] back in March. She had an MRI of her liver at that time.  She has seen an orthopedic surgeon there more recently. She has osteoarthritis of the knees. Again I think this is probably because of all the weight that she has. I'm sure she's going to have surgery.   Medications: Current outpatient prescriptions: albuterol (PROVENTIL HFA;VENTOLIN HFA) 108 (90 BASE) MCG/ACT inhaler, Inhale 2 puffs into the lungs every 6 (six) hours as needed. For shortness of breath., Disp: , Rfl: ;  amLODipine (NORVASC) 10 MG tablet, Take 1 tablet (10 mg total) by mouth daily., Disp: 90 tablet, Rfl: 0;  [facility] ALLERGY RELIEF 180 MG tablet, TAKE 1 TABLET BY MOUTH EVERY DAY, Disp: 30 tablet, Rfl: 11 Diclofenac Sodium (PENNSAID) 1.5 % SOLN, Place 40 drops onto the skin 4 (four) times daily as needed. For joint pain., Disp: , Rfl: ;  dorzolamide-timolol (COSOPT) 22.3-6.8 MG/ML ophthalmic solution, Place 1 drop into the right eye 2 (two) times daily., Disp: , Rfl: ;  metroNIDAZOLE (METROCREAM) 0.75 % cream, Apply 1  application topically as needed. , Disp: , Rfl:  montelukast (SINGULAIR) 10 MG tablet, TAKE 1 TABLET AT BEDTIME, Disp: 90 tablet, Rfl: 2;  Multiple Vitamin (MULTI VITAMIN DAILY PO), Take by mouth every morning., Disp: , Rfl: ;  naproxen (NAPROSYN) 500 MG tablet, Take 1 tablet by mouth 2 (two) times daily., Disp: , Rfl: ;  ondansetron (ZOFRAN) 4 MG tablet, Take 4 mg by mouth every 8 (eight) hours as needed. For nausea., Disp: , Rfl:  Ospemifene (OSPHENA) 60 MG TABS, Take 1 tablet by mouth daily., Disp: 90 tablet, Rfl: 3;  pantoprazole (PROTONIX) 40 MG tablet, Take 1 tablet (40 mg total) by mouth 2 (two) times daily., Disp: 60 tablet, Rfl: 3;  valsartan (DIOVAN) 160 MG tablet, Take 1 tablet (160 mg total) by mouth daily., Disp: 90 tablet, Rfl: 1;  venlafaxine XR (EFFEXOR-XR) 150 MG 24 hr capsule, Take 1 capsule (150 mg total) by mouth daily., Disp: 90 capsule, Rfl: 3 azithromycin (ZITHROMAX) 250 MG tablet, Take as directed, Disp: 6 each, Rfl: 0;  fluticasone (FLONASE) 50 MCG/ACT nasal spray, Place 2 sprays into both nostrils daily., Disp: 16 g, Rfl: 0;  triamcinolone cream (KENALOG) 0.1 %, APPLY ON RASH THREE TIMES A DAY (Patient not taking: Reported on 01/10/2014), Disp: 30 g, Rfl: 2;  valACYclovir (VALTREX) 500 MG tablet, TAKE 1 TABLET TWO TIMES A DAY as needed., Disp: , Rfl:   Allergies: No Known Allergies  Past  Medical History, Surgical history, Social history, and Family History were reviewed and updated.  Review of Systems: As above  Physical Exam:  height is 4\' 11"  (1.499 m) and weight is 224 lb (101.606 kg). Her oral temperature is 97.6 F (36.4 C). Her blood pressure is 115/78 and her pulse is 100. Her respiration is 16.   Mildly obese white female. Head and neck exam shows no ocular or oral lesions. She has no palpable cervical or supraclavicular lymph nodes. Lungs are clear bilaterally. Cardiac exam regular rate and rhythm with no murmurs rubs or bruits. Abdomen is soft. She has good bowel  sounds. There is no fluid wave. There is no palpable hepato- splenomegaly. Back exam no tenderness over the spine ribs or hips. Extremities shows no clubbing cyanosis or edema. She has some swelling with her knees. She has some decreased range of motion of her knees. There is crepitus with knee range of motion. Neurological exam shows no focal neurological deficits. Skin exam no rashes.  Lab Results  Component Value Date   WBC 10.6* 01/10/2014   HGB 12.5 01/10/2014   HCT 38.2 01/10/2014   MCV 91 01/10/2014   PLT 252 01/10/2014     Chemistry      Component Value Date/Time   NA 139 01/10/2014 0938   NA 140 12/13/2013 1018   K 3.9 01/10/2014 0938   K 3.3 12/13/2013 1018   CL 104 01/10/2014 0938   CL 100 12/13/2013 1018   CO2 25 01/10/2014 0938   CO2 26 12/13/2013 1018   BUN 13 01/10/2014 0938   BUN 15 12/13/2013 1018   CREATININE 0.66 01/10/2014 0938   CREATININE 0.5* 12/13/2013 1018      Component Value Date/Time   CALCIUM 8.8 01/10/2014 0938   CALCIUM 8.7 12/13/2013 1018   ALKPHOS 107 01/10/2014 0938   ALKPHOS 85* 12/13/2013 1018   AST 21 01/10/2014 0938   AST 18 12/13/2013 1018   ALT 28 01/10/2014 0938   ALT 25 12/13/2013 1018   BILITOT 0.5 01/10/2014 0938   BILITOT 0.70 12/13/2013 1018         Impression and Plan: Ms. Leah [name] is 53 year old white female with metastatic carcinoid. She's on Somatuline. She enjoys this as this is much more convenient for her. . She's done well with this. Her symptoms are very well controlled.  We will go ahead with a dose today.  We'll have her come back here monthly for her injections. We will see her back ourselves in about 2 or 3 months.   [name], MD 12/2/20157:35 AM

## 2014-01-14 LAB — COMPREHENSIVE METABOLIC PANEL
ALT: 28 U/L (ref 0–35)
AST: 21 U/L (ref 0–37)
Albumin: 3.8 g/dL (ref 3.5–5.2)
Alkaline Phosphatase: 107 U/L (ref 39–117)
BILIRUBIN TOTAL: 0.5 mg/dL (ref 0.2–1.2)
BUN: 13 mg/dL (ref 6–23)
CO2: 25 meq/L (ref 19–32)
Calcium: 8.8 mg/dL (ref 8.4–10.5)
Chloride: 104 mEq/L (ref 96–112)
Creatinine, Ser: 0.66 mg/dL (ref 0.50–1.10)
GLUCOSE: 160 mg/dL — AB (ref 70–99)
Potassium: 3.9 mEq/L (ref 3.5–5.3)
SODIUM: 139 meq/L (ref 135–145)
TOTAL PROTEIN: 6.7 g/dL (ref 6.0–8.3)

## 2014-01-14 LAB — CHROMOGRANIN A: CHROMOGRANIN A: 7 ng/mL (ref <=15)

## 2014-01-15 ENCOUNTER — Encounter: Payer: Self-pay | Admitting: Hematology & Oncology

## 2014-01-15 ENCOUNTER — Encounter: Payer: Self-pay | Admitting: Family

## 2014-01-17 ENCOUNTER — Ambulatory Visit: Payer: BC Managed Care – PPO

## 2014-01-17 ENCOUNTER — Other Ambulatory Visit: Payer: BC Managed Care – PPO | Admitting: Lab

## 2014-01-17 ENCOUNTER — Ambulatory Visit: Payer: BC Managed Care – PPO | Admitting: Hematology & Oncology

## 2014-01-28 ENCOUNTER — Encounter: Payer: Self-pay | Admitting: Family

## 2014-01-30 MED ORDER — MONTELUKAST SODIUM 10 MG PO TABS: 10.0000 mg | ORAL_TABLET | Freq: Every day | ORAL | Status: DC

## 2014-01-31 ENCOUNTER — Telehealth: Payer: Self-pay | Admitting: *Deleted

## 2014-01-31 MED ORDER — VENLAFAXINE HCL ER 150 MG PO CP24: 150.0000 mg | ORAL_CAPSULE | Freq: Every day | ORAL | Status: DC

## 2014-01-31 NOTE — Telephone Encounter (Signed)
Received refill request from [facility] for venlafaxine HCL 150mg , take 1 capsule daily.  Refill sent.

## 2014-02-06 ENCOUNTER — Other Ambulatory Visit: Payer: Self-pay | Admitting: *Deleted

## 2014-02-06 ENCOUNTER — Other Ambulatory Visit: Payer: Self-pay | Admitting: Hematology & Oncology

## 2014-02-06 DIAGNOSIS — C7B8 Other secondary neuroendocrine tumors: Secondary | ICD-10-CM

## 2014-02-07 ENCOUNTER — Other Ambulatory Visit (HOSPITAL_BASED_OUTPATIENT_CLINIC_OR_DEPARTMENT_OTHER): Payer: BC Managed Care – PPO | Admitting: Lab

## 2014-02-07 ENCOUNTER — Ambulatory Visit (HOSPITAL_BASED_OUTPATIENT_CLINIC_OR_DEPARTMENT_OTHER): Payer: BC Managed Care – PPO

## 2014-02-07 DIAGNOSIS — C7B02 Secondary carcinoid tumors of liver: Secondary | ICD-10-CM

## 2014-02-07 DIAGNOSIS — C7A00 Malignant carcinoid tumor of unspecified site: Secondary | ICD-10-CM

## 2014-02-07 DIAGNOSIS — C7B8 Other secondary neuroendocrine tumors: Secondary | ICD-10-CM

## 2014-02-07 LAB — CMP (CANCER CENTER ONLY)
ALBUMIN: 3.5 g/dL (ref 3.3–5.5)
ALT(SGPT): 36 U/L (ref 10–47)
AST: 32 U/L (ref 11–38)
Alkaline Phosphatase: 98 U/L — ABNORMAL HIGH (ref 26–84)
BUN: 11 mg/dL (ref 7–22)
CHLORIDE: 103 meq/L (ref 98–108)
CO2: 28 mEq/L (ref 18–33)
CREATININE: 0.7 mg/dL (ref 0.6–1.2)
Calcium: 9.1 mg/dL (ref 8.0–10.3)
GLUCOSE: 117 mg/dL (ref 73–118)
POTASSIUM: 4.1 meq/L (ref 3.3–4.7)
Sodium: 139 mEq/L (ref 128–145)
Total Bilirubin: 0.5 mg/dl (ref 0.20–1.60)
Total Protein: 7.1 g/dL (ref 6.4–8.1)

## 2014-02-07 LAB — CBC WITH DIFFERENTIAL (CANCER CENTER ONLY)
BASO#: 0 10*3/uL (ref 0.0–0.2)
BASO%: 0.4 % (ref 0.0–2.0)
EOS ABS: 0.2 10*3/uL (ref 0.0–0.5)
EOS%: 2.5 % (ref 0.0–7.0)
HEMATOCRIT: 36.9 % (ref 34.8–46.6)
HGB: 12.2 g/dL (ref 11.6–15.9)
LYMPH#: 2.8 10*3/uL (ref 0.9–3.3)
LYMPH%: 33.6 % (ref 14.0–48.0)
MCH: 30.3 pg (ref 26.0–34.0)
MCHC: 33.1 g/dL (ref 32.0–36.0)
MCV: 92 fL (ref 81–101)
MONO#: 0.6 10*3/uL (ref 0.1–0.9)
MONO%: 7.5 % (ref 0.0–13.0)
NEUT#: 4.7 10*3/uL (ref 1.5–6.5)
NEUT%: 56 % (ref 39.6–80.0)
Platelets: 223 10*3/uL (ref 145–400)
RBC: 4.03 10*6/uL (ref 3.70–5.32)
RDW: 13.4 % (ref 11.1–15.7)
WBC: 8.4 10*3/uL (ref 3.9–10.0)

## 2014-02-07 MED ORDER — LANREOTIDE ACETATE 120 MG/0.5ML ~~LOC~~ SOLN
120.0000 mg | Freq: Once | SUBCUTANEOUS | Status: AC
  Administered 2014-02-07: 120 mg via SUBCUTANEOUS
  Filled 2014-02-07: qty 120

## 2014-02-07 NOTE — Patient Instructions (Signed)
Lanreotide injection What is this medicine? LANREOTIDE (lan REE oh tide) is used to reduce blood levels of growth hormone in patients with a condition called acromegaly. It also works to slow or stop tumor growth in patients with gastroenteropancreatic neuroendocrine tumor (GEP-NET). This medicine may be used for other purposes; ask your health care provider or pharmacist if you have questions. COMMON BRAND NAME(S): Somatuline Depot What should I tell my health care provider before I take this medicine? They need to know if you have any of these conditions: -diabetes -gallbladder disease -heart disease -kidney disease -liver disease -an unusual or allergic reaction to lanreotide, other medicines, latex, foods, dyes, or preservatives -pregnant or trying to get pregnant -breast-feeding How should I use this medicine? This medicine is for injection under the skin. It is given by a health care professional in a hospital or clinic setting. Contact your pediatrician or health care professional regarding the use of this medicine in children. Special care may be needed. Overdosage: If you think you have taken too much of this medicine contact a poison control center or emergency room at once. NOTE: This medicine is only for you. Do not share this medicine with others. What if I miss a dose? It is important not to miss your dose. Call your doctor or health care professional if you are unable to keep an appointment. What may interact with this medicine? -bromocriptine -cyclosporine -medicines for diabetes, including insulin -medicines for heart disease or hypertension -quinidine This list may not describe all possible interactions. Give your health care provider a list of all the medicines, herbs, non-prescription drugs, or dietary supplements you use. Also tell them if you smoke, drink alcohol, or use illegal drugs. Some items may interact with your medicine. What should I watch for while using  this medicine? Visit your doctor or health care professional for regular checks on your progress. Your condition will be monitored carefully while you are receiving this medicine. This medicine may cause increases or decreases in blood sugar. Signs of high blood sugar include frequent urination, unusual thirst, flushed or dry skin, difficulty breathing, drowsiness, stomach ache, nausea, vomiting or dry mouth. Signs of low blood sugar include chills, cool, pale skin or cold sweats, drowsiness, extreme hunger, fast heartbeat, headache, nausea, nervousness or anxiety, shakiness, trembling, unsteadiness, tiredness, or weakness. Contact your doctor or health care professional right away if you experience any of these symptoms. What side effects may I notice from receiving this medicine? Side effects that you should report to your doctor or health care professional as soon as possible: -allergic reactions like skin rash, itching or hives, swelling of the face, lips, or tongue -changes in blood sugar -changes in heart rate -severe stomach pain Side effects that usually do not require medical attention (report to your doctor or health care professional if they continue or are bothersome): -diarrhea or constipation -gas or stomach pain -nausea, vomiting -pain, redness, swelling and irritation at site where injected This list may not describe all possible side effects. Call your doctor for medical advice about side effects. You may report side effects to FDA at 1-800-FDA-1088. Where should I keep my medicine? This drug is given in a hospital or clinic and will not be stored at home. NOTE: This sheet is a summary. It may not cover all possible information. If you have questions about this medicine, talk to your doctor, pharmacist, or health care provider.  2015, Elsevier/Gold Standard. (2013-01-26 17:43:04)

## 2014-02-15 LAB — CHROMOGRANIN A

## 2014-02-22 ENCOUNTER — Telehealth: Payer: Self-pay | Admitting: Hematology & Oncology

## 2014-02-22 NOTE — Telephone Encounter (Signed)
Patient called and cx 03/07/14 to 03/08/14

## 2014-03-07 ENCOUNTER — Other Ambulatory Visit: Payer: BC Managed Care – PPO | Admitting: Lab

## 2014-03-07 ENCOUNTER — Ambulatory Visit: Payer: BC Managed Care – PPO

## 2014-03-07 ENCOUNTER — Other Ambulatory Visit: Payer: Self-pay | Admitting: *Deleted

## 2014-03-07 DIAGNOSIS — C7B8 Other secondary neuroendocrine tumors: Secondary | ICD-10-CM

## 2014-03-08 ENCOUNTER — Other Ambulatory Visit (HOSPITAL_BASED_OUTPATIENT_CLINIC_OR_DEPARTMENT_OTHER): Payer: BLUE CROSS/BLUE SHIELD | Admitting: Lab

## 2014-03-08 ENCOUNTER — Ambulatory Visit (HOSPITAL_BASED_OUTPATIENT_CLINIC_OR_DEPARTMENT_OTHER): Payer: BLUE CROSS/BLUE SHIELD

## 2014-03-08 DIAGNOSIS — C7B02 Secondary carcinoid tumors of liver: Secondary | ICD-10-CM

## 2014-03-08 DIAGNOSIS — C7A00 Malignant carcinoid tumor of unspecified site: Secondary | ICD-10-CM

## 2014-03-08 DIAGNOSIS — C7B8 Other secondary neuroendocrine tumors: Secondary | ICD-10-CM

## 2014-03-08 LAB — CBC WITH DIFFERENTIAL (CANCER CENTER ONLY)
BASO#: 0 10*3/uL (ref 0.0–0.2)
BASO%: 0.4 % (ref 0.0–2.0)
EOS ABS: 0.2 10*3/uL (ref 0.0–0.5)
EOS%: 2 % (ref 0.0–7.0)
HEMATOCRIT: 37.9 % (ref 34.8–46.6)
HGB: 12.5 g/dL (ref 11.6–15.9)
LYMPH#: 3.2 10*3/uL (ref 0.9–3.3)
LYMPH%: 28 % (ref 14.0–48.0)
MCH: 29.6 pg (ref 26.0–34.0)
MCHC: 33 g/dL (ref 32.0–36.0)
MCV: 90 fL (ref 81–101)
MONO#: 0.7 10*3/uL (ref 0.1–0.9)
MONO%: 6 % (ref 0.0–13.0)
NEUT%: 63.6 % (ref 39.6–80.0)
NEUTROS ABS: 7.2 10*3/uL — AB (ref 1.5–6.5)
Platelets: 239 10*3/uL (ref 145–400)
RBC: 4.22 10*6/uL (ref 3.70–5.32)
RDW: 13.5 % (ref 11.1–15.7)
WBC: 11.2 10*3/uL — ABNORMAL HIGH (ref 3.9–10.0)

## 2014-03-08 MED ORDER — LANREOTIDE ACETATE 120 MG/0.5ML ~~LOC~~ SOLN
120.0000 mg | Freq: Once | SUBCUTANEOUS | Status: AC
  Administered 2014-03-08: 120 mg via SUBCUTANEOUS
  Filled 2014-03-08: qty 120

## 2014-03-08 NOTE — Patient Instructions (Signed)
Lanreotide injection What is this medicine? LANREOTIDE (lan REE oh tide) is used to reduce blood levels of growth hormone in patients with a condition called acromegaly. It also works to slow or stop tumor growth in patients with gastroenteropancreatic neuroendocrine tumor (GEP-NET). This medicine may be used for other purposes; ask your health care provider or pharmacist if you have questions. COMMON BRAND NAME(S): Somatuline Depot What should I tell my health care provider before I take this medicine? They need to know if you have any of these conditions: -diabetes -gallbladder disease -heart disease -kidney disease -liver disease -an unusual or allergic reaction to lanreotide, other medicines, latex, foods, dyes, or preservatives -pregnant or trying to get pregnant -breast-feeding How should I use this medicine? This medicine is for injection under the skin. It is given by a health care professional in a hospital or clinic setting. Contact your pediatrician or health care professional regarding the use of this medicine in children. Special care may be needed. Overdosage: If you think you have taken too much of this medicine contact a poison control center or emergency room at once. NOTE: This medicine is only for you. Do not share this medicine with others. What if I miss a dose? It is important not to miss your dose. Call your doctor or health care professional if you are unable to keep an appointment. What may interact with this medicine? -bromocriptine -cyclosporine -medicines for diabetes, including insulin -medicines for heart disease or hypertension -quinidine This list may not describe all possible interactions. Give your health care provider a list of all the medicines, herbs, non-prescription drugs, or dietary supplements you use. Also tell them if you smoke, drink alcohol, or use illegal drugs. Some items may interact with your medicine. What should I watch for while using  this medicine? Visit your doctor or health care professional for regular checks on your progress. Your condition will be monitored carefully while you are receiving this medicine. This medicine may cause increases or decreases in blood sugar. Signs of high blood sugar include frequent urination, unusual thirst, flushed or dry skin, difficulty breathing, drowsiness, stomach ache, nausea, vomiting or dry mouth. Signs of low blood sugar include chills, cool, pale skin or cold sweats, drowsiness, extreme hunger, fast heartbeat, headache, nausea, nervousness or anxiety, shakiness, trembling, unsteadiness, tiredness, or weakness. Contact your doctor or health care professional right away if you experience any of these symptoms. What side effects may I notice from receiving this medicine? Side effects that you should report to your doctor or health care professional as soon as possible: -allergic reactions like skin rash, itching or hives, swelling of the face, lips, or tongue -changes in blood sugar -changes in heart rate -severe stomach pain Side effects that usually do not require medical attention (report to your doctor or health care professional if they continue or are bothersome): -diarrhea or constipation -gas or stomach pain -nausea, vomiting -pain, redness, swelling and irritation at site where injected This list may not describe all possible side effects. Call your doctor for medical advice about side effects. You may report side effects to FDA at 1-800-FDA-1088. Where should I keep my medicine? This drug is given in a hospital or clinic and will not be stored at home. NOTE: This sheet is a summary. It may not cover all possible information. If you have questions about this medicine, talk to your doctor, pharmacist, or health care provider.  2015, Elsevier/Gold Standard. (2013-01-26 17:43:04)

## 2014-03-11 LAB — COMPREHENSIVE METABOLIC PANEL
ALBUMIN: 3.9 g/dL (ref 3.5–5.2)
ALT: 34 U/L (ref 0–35)
AST: 22 U/L (ref 0–37)
Alkaline Phosphatase: 124 U/L — ABNORMAL HIGH (ref 39–117)
BUN: 12 mg/dL (ref 6–23)
CALCIUM: 8.8 mg/dL (ref 8.4–10.5)
CHLORIDE: 105 meq/L (ref 96–112)
CO2: 25 meq/L (ref 19–32)
Creatinine, Ser: 0.64 mg/dL (ref 0.50–1.10)
GLUCOSE: 126 mg/dL — AB (ref 70–99)
Potassium: 3.7 mEq/L (ref 3.5–5.3)
Sodium: 140 mEq/L (ref 135–145)
Total Bilirubin: 0.4 mg/dL (ref 0.2–1.2)
Total Protein: 6.9 g/dL (ref 6.0–8.3)

## 2014-03-11 LAB — CHROMOGRANIN A: CHROMOGRANIN A: 7 ng/mL (ref <=15)

## 2014-03-15 ENCOUNTER — Other Ambulatory Visit: Payer: Self-pay | Admitting: Family

## 2014-03-20 ENCOUNTER — Encounter: Payer: Self-pay | Admitting: Hematology & Oncology

## 2014-03-21 ENCOUNTER — Emergency Department (HOSPITAL_COMMUNITY)
Admission: EM | Admit: 2014-03-21 | Discharge: 2014-03-21 | Disposition: A | Discharge status: Home / Self Care | Payer: BLUE CROSS/BLUE SHIELD | Attending: Emergency Medicine | Admitting: Emergency Medicine

## 2014-03-21 ENCOUNTER — Telehealth: Payer: Self-pay | Admitting: Family

## 2014-03-21 ENCOUNTER — Telehealth: Payer: Self-pay | Admitting: *Deleted

## 2014-03-21 ENCOUNTER — Encounter (HOSPITAL_COMMUNITY): Payer: Self-pay

## 2014-03-21 DIAGNOSIS — G43909 Migraine, unspecified, not intractable, without status migrainosus: Secondary | ICD-10-CM | POA: Insufficient documentation

## 2014-03-21 DIAGNOSIS — Z8709 Personal history of other diseases of the respiratory system: Secondary | ICD-10-CM | POA: Diagnosis not present

## 2014-03-21 DIAGNOSIS — Z8603 Personal history of neoplasm of uncertain behavior: Secondary | ICD-10-CM | POA: Insufficient documentation

## 2014-03-21 DIAGNOSIS — Z8639 Personal history of other endocrine, nutritional and metabolic disease: Secondary | ICD-10-CM | POA: Diagnosis not present

## 2014-03-21 DIAGNOSIS — Z90710 Acquired absence of both cervix and uterus: Secondary | ICD-10-CM | POA: Diagnosis not present

## 2014-03-21 DIAGNOSIS — Z7951 Long term (current) use of inhaled steroids: Secondary | ICD-10-CM | POA: Diagnosis not present

## 2014-03-21 DIAGNOSIS — R1010 Upper abdominal pain, unspecified: Secondary | ICD-10-CM

## 2014-03-21 DIAGNOSIS — M791 Myalgia: Secondary | ICD-10-CM | POA: Diagnosis not present

## 2014-03-21 DIAGNOSIS — Z8619 Personal history of other infectious and parasitic diseases: Secondary | ICD-10-CM | POA: Diagnosis not present

## 2014-03-21 DIAGNOSIS — Z792 Long term (current) use of antibiotics: Secondary | ICD-10-CM | POA: Insufficient documentation

## 2014-03-21 DIAGNOSIS — K219 Gastro-esophageal reflux disease without esophagitis: Secondary | ICD-10-CM | POA: Insufficient documentation

## 2014-03-21 DIAGNOSIS — Z9089 Acquired absence of other organs: Secondary | ICD-10-CM | POA: Insufficient documentation

## 2014-03-21 DIAGNOSIS — I10 Essential (primary) hypertension: Secondary | ICD-10-CM | POA: Insufficient documentation

## 2014-03-21 DIAGNOSIS — K921 Melena: Secondary | ICD-10-CM | POA: Insufficient documentation

## 2014-03-21 DIAGNOSIS — Z791 Long term (current) use of non-steroidal anti-inflammatories (NSAID): Secondary | ICD-10-CM | POA: Diagnosis not present

## 2014-03-21 DIAGNOSIS — Z79899 Other long term (current) drug therapy: Secondary | ICD-10-CM | POA: Diagnosis not present

## 2014-03-21 LAB — CBC WITH DIFFERENTIAL/PLATELET
BASOS ABS: 0 10*3/uL (ref 0.0–0.1)
BASOS PCT: 0 % (ref 0–1)
EOS PCT: 1 % (ref 0–5)
Eosinophils Absolute: 0.2 10*3/uL (ref 0.0–0.7)
HEMATOCRIT: 40.6 % (ref 36.0–46.0)
Hemoglobin: 13.1 g/dL (ref 12.0–15.0)
LYMPHS PCT: 24 % (ref 12–46)
Lymphs Abs: 4.2 10*3/uL — ABNORMAL HIGH (ref 0.7–4.0)
MCH: 28.7 pg (ref 26.0–34.0)
MCHC: 32.3 g/dL (ref 30.0–36.0)
MCV: 89 fL (ref 78.0–100.0)
Monocytes Absolute: 1.2 10*3/uL — ABNORMAL HIGH (ref 0.1–1.0)
Monocytes Relative: 7 % (ref 3–12)
NEUTROS ABS: 11.7 10*3/uL — AB (ref 1.7–7.7)
Neutrophils Relative %: 68 % (ref 43–77)
PLATELETS: 292 10*3/uL (ref 150–400)
RBC: 4.56 MIL/uL (ref 3.87–5.11)
RDW: 13.9 % (ref 11.5–15.5)
WBC: 17.4 10*3/uL — AB (ref 4.0–10.5)

## 2014-03-21 LAB — TYPE AND SCREEN
ABO/RH(D): A POS
Antibody Screen: NEGATIVE

## 2014-03-21 LAB — COMPREHENSIVE METABOLIC PANEL
ALBUMIN: 4 g/dL (ref 3.5–5.2)
ALK PHOS: 122 U/L — AB (ref 39–117)
ALT: 45 U/L — ABNORMAL HIGH (ref 0–35)
ANION GAP: 8 (ref 5–15)
AST: 31 U/L (ref 0–37)
BILIRUBIN TOTAL: 0.6 mg/dL (ref 0.3–1.2)
BUN: 22 mg/dL (ref 6–23)
CO2: 26 mmol/L (ref 19–32)
Calcium: 8.6 mg/dL (ref 8.4–10.5)
Chloride: 103 mmol/L (ref 96–112)
Creatinine, Ser: 0.75 mg/dL (ref 0.50–1.10)
GFR calc non Af Amer: >90 mL/min (ref >90)
GLUCOSE: 116 mg/dL — AB (ref 70–99)
POTASSIUM: 3.6 mmol/L (ref 3.5–5.1)
Sodium: 137 mmol/L (ref 135–145)
Total Protein: 7.3 g/dL (ref 6.0–8.3)

## 2014-03-21 LAB — POC OCCULT BLOOD, ED: Fecal Occult Bld: NEGATIVE

## 2014-03-21 LAB — LIPASE, BLOOD: Lipase: 23 U/L (ref 11–59)

## 2014-03-21 LAB — PROTIME-INR
INR: 1.04 (ref 0.00–1.49)
Prothrombin Time: 13.7 seconds (ref 11.6–15.2)

## 2014-03-21 MED ORDER — SODIUM CHLORIDE 0.9 % IV BOLUS (SEPSIS)
1000.0000 mL | Freq: Once | INTRAVENOUS | Status: AC
  Administered 2014-03-21: 1000 mL via INTRAVENOUS

## 2014-03-21 MED ORDER — ONDANSETRON HCL 4 MG/2ML IJ SOLN
4.0000 mg | Freq: Once | INTRAMUSCULAR | Status: AC
  Administered 2014-03-21: 4 mg via INTRAVENOUS
  Filled 2014-03-21: qty 2

## 2014-03-21 NOTE — Telephone Encounter (Signed)
please schedule ED follow up in the next 48 hours.

## 2014-03-21 NOTE — ED Notes (Signed)
Pt presents with c/o rectal bleeding that started approx 3 days ago; no clots passed per patient.

## 2014-03-21 NOTE — ED Notes (Signed)
Pt c/o generalized body aches, tarry stools, and n/v x 3-4 days.  Pain score 7/10.  Pt reports intermittent back and RUQ spasms.  Hx of neuroendocrine carcinoid tumor.  Last chemo treatment 1/27.

## 2014-03-21 NOTE — Telephone Encounter (Signed)
Received email from patient  Stating that she has had black stools x 3 days as well as feeling bad and heart racing. Called patient back to tell her that Dr. Marin Olp wants her to go to the ED as soon as possible.  Left message and asked patient to call me back.

## 2014-03-21 NOTE — ED Provider Notes (Signed)
CSN: [id number]     Arrival date & time 03/21/14  1710 History   First MD Initiated Contact with Patient 03/21/14 1744     Chief Complaint  Patient presents with  . Rectal Bleeding  . Generalized Body Aches  . Emesis     (Consider location/radiation/quality/duration/timing/severity/associated sxs/prior Treatment) HPI  54 year old female presents with black stools for last 3-4 days. She's also been having recurrent upper abdominal pain, nausea, vomiting, and body aches. 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 She has never had black stools before. She's talking to her doctor mentioning she was going to another episode of pancreatitis and he told her to come to the ER due to the black stools. She's never had rectal bleeding before. She's had a history of hepatic encephalopathy before but per her she has no actual liver disease or cirrhosis. Not on any blood thinners. Denies lightheadedness or dizziness. Declines pain medicine at this time, feels dehydrated and nauseated.  Past Medical History  Diagnosis Date  . Sarcoidosis 2008  . Allergic rhinitis   . GE reflux   . Neuroendocrine tumor 2011  . Surgical menopause   . Migraines   . Atrophic vaginitis   . STD (sexually transmitted disease)     HSV I  . Hepatic encephalopathy 2011  . HTN (hypertension) 2011  . Carcinoid syndrome   . Borderline diabetes 03/17/2013  . Orthostatic hypotension   . Palpitations    Past Surgical History  Procedure Laterality Date  . Tonsillectomy  1976  . Cesarean section  [id number]  . Cholecystectomy  2006  . Abdominal hysterectomy  2004  . Oophorectomy  2004  . Mastectomy  2008    bilateral  . Bilateral mastectectomy Bilateral 2008  . Latissimus dorsi graft  2008    used for reconstructive breast surgery  . Hepatetetomy    . Tubal ligation  2001    BTL  . Breast surgery      bilateral  masectomy and reconstruction with silicone implants  . Knee arthroscopy Left 09/2010   Family History  Problem Relation Age of Onset  . Arthritis Maternal Grandmother   . Arthritis Maternal Grandfather   . Arthritis Paternal Grandmother   . Arthritis Paternal Grandfather   . Heart failure Paternal Grandfather   . Breast cancer Mother 52    died at 64  . Lupus Mother   . Heart failure Father   . Hypertension Father   . Hyperlipidemia Father   . Breast cancer Maternal Aunt 38    deceased at 64 recurrence  . Ovarian cancer Maternal Aunt 36     died with in 3 months  . Breast cancer Maternal Aunt 40    died at age 65   History  Substance Use Topics  . Smoking status: Never Smoker   . Smokeless tobacco: Never Used     Comment: never used tobacco  . Alcohol Use: No   OB History    Gravida Para Term Preterm AB TAB SAB Ectopic Multiple Living   2 2 2       2      Review of Systems  Constitutional: Positive for chills. Negative for fever.  Respiratory: Negative for shortness of breath.   Cardiovascular: Negative for chest pain.  Gastrointestinal: Positive for nausea, vomiting and abdominal pain. Negative for diarrhea.  Musculoskeletal: Positive for myalgias.  All other systems reviewed and are negative.  Allergies  Osphena  Home Medications   Prior to Admission medications   Medication Sig Start Date End Date Taking? Authorizing Provider  albuterol (PROVENTIL HFA;VENTOLIN HFA) 108 (90 BASE) MCG/ACT inhaler Inhale 2 puffs into the lungs every 6 (six) hours as needed. For shortness of breath.   Yes Historical Provider, MD  amLODipine (NORVASC) 10 MG tablet Take 1 tablet (10 mg total) by mouth daily. 11/14/13  Yes [name], NP  [facility] ALLERGY RELIEF 180 MG tablet TAKE 1 TABLET BY MOUTH EVERY DAY 12/24/13  Yes Elby Showers, MD  Diclofenac Sodium (PENNSAID) 1.5 % SOLN Place 40 drops onto the skin 4 (four) times daily as needed. For joint pain.   Yes Historical  Provider, MD  fluticasone (FLONASE) 50 MCG/ACT nasal spray USE 2 SPRAYS IN BOTH NOSTRILS EVERY DAY Patient taking differently: USE 2 SPRAYS IN BOTH NOSTRILS EVERY DAY as needed for congestion 02/06/14  Yes [name], MD  montelukast (SINGULAIR) 10 MG tablet Take 1 tablet (10 mg total) by mouth at bedtime. 01/30/14  Yes [name], NP  Multiple Vitamin (MULTI VITAMIN DAILY PO) Take by mouth every morning.   Yes Historical Provider, MD  naproxen (NAPROSYN) 500 MG tablet Take 1 tablet by mouth 2 (two) times daily. 08/12/12  Yes Historical Provider, MD  ondansetron (ZOFRAN) 4 MG tablet Take 4 mg by mouth every 8 (eight) hours as needed. For nausea. 02/26/11  Yes Historical Provider, MD  pantoprazole (PROTONIX) 40 MG tablet Take 1 tablet (40 mg total) by mouth 2 (two) times daily. 06/21/13  Yes [name], NP  triamcinolone cream (KENALOG) 0.1 % APPLY ON RASH THREE TIMES A DAY Patient taking differently: APPLY ON RASH THREE TIMES A DAY as needed   Yes Elby Showers, MD  valACYclovir (VALTREX) 500 MG tablet Take 500 mg by mouth 2 (two) times daily as needed. Cold sores 07/15/12  Yes Patricia Rolen-Grubb, FNP  valsartan (DIOVAN) 160 MG tablet Take 1 tablet (160 mg total) by mouth daily. 12/19/13  Yes [name], NP  venlafaxine XR (EFFEXOR-XR) 150 MG 24 hr capsule Take 1 capsule (150 mg total) by mouth daily. 01/31/14  Yes [name], NP  azithromycin (ZITHROMAX) 250 MG tablet Take as directed Patient not taking: Reported on 03/21/2014 01/10/14   [name], MD  Ospemifene (OSPHENA) 60 MG TABS Take 1 tablet by mouth daily. Patient not taking: Reported on 03/21/2014 09/07/12   Mardene Celeste Rolen-Grubb, FNP   BP 159/101 mmHg  Pulse 125  Temp(Src) 98.3 F (36.8 C) (Oral)  Resp 16  SpO2 99% Physical Exam  Constitutional: She is oriented to person, place, and time. She appears well-developed and well-nourished. No distress.  HENT:  Head: Normocephalic and atraumatic.  Right  Ear: External ear normal.  Left Ear: External ear normal.  Nose: Nose normal.  Eyes: Right eye exhibits no discharge. Left eye exhibits no discharge.  Cardiovascular: Normal rate, regular rhythm and normal heart sounds.   Pulmonary/Chest: Effort normal and breath sounds normal.  Abdominal: Soft. She exhibits no distension. There is tenderness (mild) in the right upper quadrant, epigastric area and left upper quadrant.  Genitourinary: Rectal exam shows no external hemorrhoid and no internal hemorrhoid. Guaiac negative stool.  No stool on rectal exam  Neurological: She is alert and oriented to person, place, and time.  Skin: Skin is warm and dry. She is not diaphoretic.  Nursing note and vitals reviewed.   ED Course  Procedures (including critical care time) Labs Review  Labs Reviewed  COMPREHENSIVE METABOLIC PANEL - Abnormal; Notable for the following:    Glucose, Bld 116 (*)    ALT 45 (*)    Alkaline Phosphatase 122 (*)    All other components within normal limits  CBC WITH DIFFERENTIAL/PLATELET - Abnormal; Notable for the following:    WBC 17.4 (*)    Neutro Abs 11.7 (*)    Lymphs Abs 4.2 (*)    Monocytes Absolute 1.2 (*)    All other components within normal limits  PROTIME-INR  LIPASE, BLOOD  POC OCCULT BLOOD, ED  TYPE AND SCREEN  ABO/RH    Imaging Review No results found.   EKG Interpretation None      MDM   Final diagnoses:  Upper abdominal pain  Black stools    Patient's workup is unremarkable. Her pain is well controlled here, nausea controlled as well. She has Zofran at home in case nausea returns. Her abdominal pain and nausea seem consistent with multiple prior abdominal pain flares. I do not feel she is acute imaging at this time. As for her black stools, there is no stool on rectal exam to evaluated this is melena or not. Her hemoglobin is at her baseline and there is no anemia. Her vital signs are normal. She is at this time stable for discharge and  follow-up with her PCP. I have recommended she bring a stool sample to be tested at her PCPs.    Ephraim Hamburger, MD 03/22/14 [id number]

## 2014-03-21 NOTE — Discharge Instructions (Signed)
Abdominal Pain °Many things can cause abdominal pain. Usually, abdominal pain is not caused by a disease and will improve without treatment. It can often be observed and treated at home. Your health care provider will do a physical exam and possibly order blood tests and X-rays to help determine the seriousness of your pain. However, in many cases, more time must pass before a clear cause of the pain can be found. Before that point, your health care provider may not know if you need more testing or further treatment. °HOME CARE INSTRUCTIONS  °Monitor your abdominal pain for any changes. The following actions may help to alleviate any discomfort you are experiencing: °· Only take over-the-counter or prescription medicines as directed by your health care provider. °· Do not take laxatives unless directed to do so by your health care provider. °· Try a clear liquid diet (broth, tea, or water) as directed by your health care provider. Slowly move to a bland diet as tolerated. °SEEK MEDICAL CARE IF: °· You have unexplained abdominal pain. °· You have abdominal pain associated with nausea or diarrhea. °· You have pain when you urinate or have a bowel movement. °· You experience abdominal pain that wakes you in the night. °· You have abdominal pain that is worsened or improved by eating food. °· You have abdominal pain that is worsened with eating fatty foods. °· You have a fever. °SEEK IMMEDIATE MEDICAL CARE IF:  °· Your pain does not go away within 2 hours. °· You keep throwing up (vomiting). °· Your pain is felt only in portions of the abdomen, such as the right side or the left lower portion of the abdomen. °· You pass bloody or black tarry stools. °MAKE SURE YOU: °· Understand these instructions.   °· Will watch your condition.   °· Will get help right away if you are not doing well or get worse.   °Document Released: 11/06/2004 Document Revised: 02/01/2013 Document Reviewed: 10/06/2012 °[employer]® Patient Information  ©2015 [employer]. This information is not intended to replace advice given to you by your health care provider. Make sure you discuss any questions you have with your health care provider. ° °Bloody Stools °Bloody stools often mean that there is a problem in the digestive tract. Your caregiver may use the term "melena" to describe black, tarry, and bad smelling stools or "hematochezia" to describe red or maroon-colored stools. Blood seen in the stool can be caused by bleeding anywhere along the intestinal tract.  °A black stool usually means that blood is coming from the upper part of the gastrointestinal tract (esophagus, stomach, or small bowel). Passing maroon-colored stools or bright red blood usually means that blood is coming from lower down in the large bowel or the rectum. However, sometimes massive bleeding in the stomach or small intestine can cause bright red bloody stools.  °Consuming black licorice, lead, iron pills, medicines containing bismuth subsalicylate, or blueberries can also cause black stools. Your caregiver can test black stools to see if blood is present. °It is important that the cause of the bleeding be found. Treatment can then be started, and the problem can be corrected. Rectal bleeding may not be serious, but you should not assume everything is okay until you know the cause. It is very important to follow up with your caregiver or a specialist in gastrointestinal problems. °CAUSES  °Blood in the stools can come from various underlying causes. Often, the cause is not found during your first visit. Testing is often needed to discover the   cause of bleeding in the gastrointestinal tract. Causes range from simple to serious or even life-threatening. Possible causes include: °· Hemorrhoids. These are veins that are full of blood (engorged) in the rectum. They cause pain, inflammation, and may bleed. °· Anal fissures. These are areas of painful tearing which may bleed. They are often  caused by passing hard stool. °· Diverticulosis. These are pouches that form on the colon over time, with age, and may bleed significantly. °· Diverticulitis. This is inflammation in areas with diverticulosis. It can cause pain, fever, and bloody stools, although bleeding is rare. °· Proctitis and colitis. These are inflamed areas of the rectum or colon. They may cause pain, fever, and bloody stools. °· Polyps and cancer. Colon cancer is a leading cause of preventable cancer death. It often starts out as precancerous polyps that can be removed during a colonoscopy, preventing progression into cancer. Sometimes, polyps and cancer may cause rectal bleeding. °· Gastritis and ulcers. Bleeding from the upper gastrointestinal tract (near the stomach) may travel through the intestines and produce black, sometimes tarry, often bad smelling stools. In certain cases, if the bleeding is fast enough, the stools may not be black, but red and the condition may be life-threatening. °SYMPTOMS  °You may have stools that are bright red and bloody, that are normal color with blood on them, or that are dark black and tarry. In some cases, you may only have blood in the toilet bowl. Any of these cases need medical care. You may also have: °· Pain at the anus or anywhere in the rectum. °· Lightheadedness or feeling faint. °· Extreme weakness. °· Nausea or vomiting. °· Fever. °DIAGNOSIS °Your caregiver may use the following methods to find the cause of your bleeding: °· Taking a medical history. Age is important. Older people tend to develop polyps and cancer more often. If there is anal pain and a hard, large stool associated with bleeding, a tear of the anus may be the cause. If blood drips into the toilet after a bowel movement, bleeding hemorrhoids may be the problem. The color and frequency of the bleeding are additional considerations. In most cases, the medical history provides clues, but seldom the final answer. °· A visual and  finger (digital) exam. Your caregiver will inspect the anal area, looking for tears and hemorrhoids. A finger exam can provide information when there is tenderness or a growth inside. In men, the prostate is also examined. °· Endoscopy. Several types of small, long scopes (endoscopes) are used to view the colon. °¨ In the office, your caregiver may use a rigid, or more commonly, a flexible viewing sigmoidoscope. This exam is called flexible sigmoidoscopy. It is performed in 5 to 10 minutes. °¨ A more thorough exam is accomplished with a colonoscope. It allows your caregiver to view the entire 5 to 6 foot long colon. Medicine to help you relax (sedative) is usually given for this exam. Frequently, a bleeding lesion may be present beyond the reach of the sigmoidoscope. So, a colonoscopy may be the best exam to start with. Both exams are usually done on an outpatient basis. This means the patient does not stay overnight in the hospital or surgery center. °¨ An upper endoscopy may be needed to examine your stomach. Sedation is used and a flexible endoscope is put in your mouth, down to your stomach. °· A barium enema X-ray. This is an X-ray exam. It uses liquid barium inserted by enema into the rectum. This test alone may not identify an actual bleeding point. X-rays   highlight abnormal shadows, such as those made by lumps (tumors), diverticuli, or colitis. °TREATMENT  °Treatment depends on the cause of your bleeding.  °· For bleeding from the stomach or colon, the caregiver doing your endoscopy or colonoscopy may be able to stop the bleeding as part of the procedure. °· Inflammation or infection of the colon can be treated with medicines. °· Many rectal problems can be treated with creams, suppositories, or warm baths. °· Surgery is sometimes needed. °· Blood transfusions are sometimes needed if you have lost a lot of blood. °· For any bleeding problem, let your caregiver know if you take aspirin or other blood thinners  regularly. °HOME CARE INSTRUCTIONS  °· Take any medicines exactly as prescribed. °· Keep your stools soft by eating a diet high in fiber. Prunes (1 to 3 a day) work well for many people. °· Drink enough water and fluids to keep your urine clear or pale yellow. °· Take sitz baths if advised. A sitz bath is when you sit in a bathtub with warm water for 10 to 15 minutes to soak, soothe, and cleanse the rectal area. °· If enemas or suppositories are advised, be sure you know how to use them. Tell your caregiver if you have problems with this. °· Monitor your bowel movements to look for signs of improvement or worsening. °SEEK MEDICAL CARE IF:  °· You do not improve in the time expected. °· Your condition worsens after initial improvement. °· You develop any new symptoms. °SEEK IMMEDIATE MEDICAL CARE IF:  °· You develop severe or prolonged rectal bleeding. °· You vomit blood. °· You feel weak or faint. °· You have a fever. °MAKE SURE YOU: °· Understand these instructions. °· Will watch your condition. °· Will get help right away if you are not doing well or get worse. °Document Released: 01/17/2002 Document Revised: 04/21/2011 Document Reviewed: 06/14/2010 °Exit[employer]® Patient Information ©2015 [employer]. This information is not intended to replace advice given to you by your health care provider. Make sure you discuss any questions you have with your health care provider. ° °

## 2014-03-22 LAB — ABO/RH: ABO/RH(D): A POS

## 2014-03-22 NOTE — Telephone Encounter (Signed)
Attempted to reach pt and left message to return my call. ED f/u scheduled for 03/24/14 at 10:45am (pt unaware of appt at this time).

## 2014-03-23 NOTE — Telephone Encounter (Signed)
Pt aware of appointment and plans to keep it.

## 2014-03-24 ENCOUNTER — Ambulatory Visit (INDEPENDENT_AMBULATORY_CARE_PROVIDER_SITE_OTHER): Payer: BLUE CROSS/BLUE SHIELD | Admitting: Family

## 2014-03-24 ENCOUNTER — Encounter: Payer: Self-pay | Admitting: Family

## 2014-03-24 VITALS — BP 165/96 | HR 98 | Temp 98.3°F | Ht 59.0 in | Wt 226.0 lb

## 2014-03-24 DIAGNOSIS — R739 Hyperglycemia, unspecified: Secondary | ICD-10-CM

## 2014-03-24 DIAGNOSIS — I10 Essential (primary) hypertension: Secondary | ICD-10-CM

## 2014-03-24 DIAGNOSIS — D72829 Elevated white blood cell count, unspecified: Secondary | ICD-10-CM

## 2014-03-24 DIAGNOSIS — C7B8 Other secondary neuroendocrine tumors: Secondary | ICD-10-CM

## 2014-03-24 LAB — CBC WITH DIFFERENTIAL/PLATELET
BASOS PCT: 0.5 % (ref 0.0–3.0)
Basophils Absolute: 0.1 10*3/uL (ref 0.0–0.1)
EOS ABS: 0.2 10*3/uL (ref 0.0–0.7)
Eosinophils Relative: 2.4 % (ref 0.0–5.0)
HCT: 37.2 % (ref 36.0–46.0)
Hemoglobin: 12.6 g/dL (ref 12.0–15.0)
Lymphocytes Relative: 29.5 % (ref 12.0–46.0)
Lymphs Abs: 3 10*3/uL (ref 0.7–4.0)
MCHC: 33.9 g/dL (ref 30.0–36.0)
MCV: 86.4 fl (ref 78.0–100.0)
MONOS PCT: 5.8 % (ref 3.0–12.0)
Monocytes Absolute: 0.6 10*3/uL (ref 0.1–1.0)
NEUTROS PCT: 61.8 % (ref 43.0–77.0)
Neutro Abs: 6.3 10*3/uL (ref 1.4–7.7)
Platelets: 259 10*3/uL (ref 150.0–400.0)
RBC: 4.3 Mil/uL (ref 3.87–5.11)
RDW: 14.2 % (ref 11.5–15.5)
WBC: 10.3 10*3/uL (ref 4.0–10.5)

## 2014-03-24 LAB — HEMOGLOBIN A1C: Hgb A1c MFr Bld: 6.6 % — ABNORMAL HIGH (ref 4.6–6.5)

## 2014-03-24 MED ORDER — VALSARTAN 320 MG PO TABS: 320.0000 mg | ORAL_TABLET | Freq: Every day | ORAL | Status: DC

## 2014-03-24 NOTE — Progress Notes (Signed)
Pre visit review using our clinic review tool, if applicable. No additional management support is needed unless otherwise documented below in the visit note. 

## 2014-03-24 NOTE — Patient Instructions (Signed)
Increase diovan from 160-320mg . Complete lab work prior to leaving. Keep your follow up with Dr. Marin Olp. Please schedule a follow up appointment in 1 month.

## 2014-03-24 NOTE — Assessment & Plan Note (Signed)
Clinically improved since day she was in ED on 2/9, now tolerating PO's. No further dark stools to suggest melena or acute blood loss. Will obtain cbc to assess WBC and H/H.  Advised pt to keep her upcoming appointment with oncology on 2/23.

## 2014-03-24 NOTE — Assessment & Plan Note (Signed)
Uncontrolled.  Increase diovan from 160mg  to 320mg . Follow up in 1 month for BP check and bmet.

## 2014-03-24 NOTE — Progress Notes (Signed)
Subjective:    Patient ID: Leah [name], female    DOB: 22-Jul-1960, 54 y.o.   MRN: [id number]  HPI  Ms. Leah [name] is a 54 yr old female who presents today for ED follow up.  Symptoms started with flu like symptoms. She contacted Dr. Antonieta Pert [name].  She was seen on 03/21/14 with chief complaint of black stools and recurrent RUQ pain.  She has hx of carcinoid syndrome.  WBC at the time of her appointment was 17.4.  She had  Dark stool.  This morning she had small hard balls of brown stool.     She reports that she continues to have abdominal pain- described as aching in the ruq. Reports that this is consistent with the typical pain she experiences from her carcinoid syndrome. Reports that her pain has improved since the day she was in the ED. Denies associated fever. She is tolerating PO's and has had no recent vomiting.   Elevated BP- BP meds amlodipine 10, diovan 160- reports + compliance with meds.   BP Readings from Last 3 Encounters:  03/24/14 165/96  03/21/14 131/84  03/08/14 181/106    Review of Systems See HPI  Past Medical History  Diagnosis Date  . Sarcoidosis 2008  . Allergic rhinitis   . GE reflux   . Neuroendocrine tumor 2011  . Surgical menopause   . Migraines   . Atrophic vaginitis   . STD (sexually transmitted disease)     HSV I  . Hepatic encephalopathy 2011  . HTN (hypertension) 2011  . Carcinoid syndrome   . Borderline diabetes 03/17/2013  . Orthostatic hypotension   . Palpitations     History   Social History  . Marital Status: Married    Spouse Name: N/A  . Number of Children: N/A  . Years of Education: N/A   Occupational History  . Not on file.   Social History Main Topics  . Smoking status: Never Smoker   . Smokeless tobacco: Never Used     Comment: never used tobacco  . Alcohol Use: No  . Drug Use: No  . Sexual Activity: Yes    Birth Control/ Protection: Surgical     Comment: hysterectomy   Other Topics Concern  . Not on file    Social History Narrative   Married to Leah [name]   2 sons- 2001 Leah [name] and  Leah [name] (1999)   Has dog   She is unemployed, has worked in the past ([name] work)   Completed some college   Enjoys Theatre manager    Past Surgical History  Procedure Laterality Date  . Tonsillectomy  1976  . Cesarean section  [id number]  . Cholecystectomy  2006  . Abdominal hysterectomy  2004  . Oophorectomy  2004  . Mastectomy  2008    bilateral  . Bilateral mastectectomy Bilateral 2008  . Latissimus dorsi graft  2008    used for reconstructive breast surgery  . Hepatetetomy    . Tubal ligation  2001    BTL  . Breast surgery      bilateral masectomy and reconstruction with silicone implants  . Knee arthroscopy Left 09/2010    Family History  Problem Relation Age of Onset  . Arthritis Maternal Grandmother   . Arthritis Maternal Grandfather   . Arthritis Paternal Grandmother   . Arthritis Paternal Grandfather   . Heart failure Paternal Grandfather   . Breast cancer Mother 25    died at 30  . Lupus Mother   .  Heart failure Father   . Hypertension Father   . Hyperlipidemia Father   . Breast cancer Maternal Aunt 38    deceased at 21 recurrence  . Ovarian cancer Maternal Aunt 57     died with in 36 months  . Breast cancer Maternal Aunt 40    died at age 16    Allergies  Allergen Reactions  . Osphena [Ospemifene]     Spiked blood pressure    Current Outpatient Prescriptions on File Prior to Visit  Medication Sig Dispense Refill  . albuterol (PROVENTIL HFA;VENTOLIN HFA) 108 (90 BASE) MCG/ACT inhaler Inhale 2 puffs into the lungs every 6 (six) hours as needed. For shortness of breath.    Marland Kitchen amLODipine (NORVASC) 10 MG tablet Take 1 tablet (10 mg total) by mouth daily. 90 tablet 0  . [facility] ALLERGY RELIEF 180 MG tablet TAKE 1 TABLET BY MOUTH EVERY DAY 30 tablet 11  . Diclofenac Sodium (PENNSAID) 1.5 % SOLN Place 40 drops onto the skin 4 (four) times daily as needed. For joint pain.    .  fluticasone (FLONASE) 50 MCG/ACT nasal spray USE 2 SPRAYS IN BOTH NOSTRILS EVERY DAY (Patient taking differently: USE 2 SPRAYS IN BOTH NOSTRILS EVERY DAY as needed for congestion) 16 g 0  . montelukast (SINGULAIR) 10 MG tablet Take 1 tablet (10 mg total) by mouth at bedtime. 30 tablet 0  . naproxen (NAPROSYN) 500 MG tablet Take 1 tablet by mouth 2 (two) times daily.    . ondansetron (ZOFRAN) 4 MG tablet Take 4 mg by mouth every 8 (eight) hours as needed. For nausea.    . pantoprazole (PROTONIX) 40 MG tablet Take 1 tablet (40 mg total) by mouth 2 (two) times daily. 60 tablet 3  . triamcinolone cream (KENALOG) 0.1 % APPLY ON RASH THREE TIMES A DAY (Patient taking differently: APPLY ON RASH THREE TIMES A DAY as needed) 30 g 2  . valACYclovir (VALTREX) 500 MG tablet Take 500 mg by mouth 2 (two) times daily as needed. Cold sores    . valsartan (DIOVAN) 160 MG tablet Take 1 tablet (160 mg total) by mouth daily. 90 tablet 1  . venlafaxine XR (EFFEXOR-XR) 150 MG 24 hr capsule Take 1 capsule (150 mg total) by mouth daily. 90 capsule 0  . Multiple Vitamin (MULTI VITAMIN DAILY PO) Take by mouth every morning.    . Ospemifene (OSPHENA) 60 MG TABS Take 1 tablet by mouth daily. (Patient not taking: Reported on 03/21/2014) 90 tablet 3   No current facility-administered medications on file prior to visit.    BP 165/96 mmHg  Pulse 98  Temp(Src) 98.3 F (36.8 C) (Oral)  Ht 4\' 11"  (1.499 m)  Wt 226 lb (102.513 kg)  BMI 45.62 kg/m2  SpO2 98%       Objective:   Physical Exam  Constitutional: She is oriented to person, place, and time. She appears well-developed and well-nourished.  Cardiovascular: Normal rate, regular rhythm and normal heart sounds.   No murmur heard. Pulmonary/Chest: Effort normal and breath sounds normal. No respiratory distress. She has no wheezes.  Abdominal: Soft. There is no rigidity, no rebound and no guarding.  Musculoskeletal:  2+ bilateral pedal edema  Neurological: She is  alert and oriented to person, place, and time.  Psychiatric: She has a normal mood and affect. Her behavior is normal. Judgment and thought content normal.          Assessment & Plan:

## 2014-03-26 ENCOUNTER — Encounter: Payer: Self-pay | Admitting: Family

## 2014-04-01 ENCOUNTER — Other Ambulatory Visit: Payer: Self-pay | Admitting: Family

## 2014-04-03 ENCOUNTER — Other Ambulatory Visit: Payer: Self-pay | Admitting: *Deleted

## 2014-04-03 DIAGNOSIS — C7B8 Other secondary neuroendocrine tumors: Secondary | ICD-10-CM

## 2014-04-04 ENCOUNTER — Other Ambulatory Visit (HOSPITAL_BASED_OUTPATIENT_CLINIC_OR_DEPARTMENT_OTHER): Payer: BLUE CROSS/BLUE SHIELD | Admitting: Lab

## 2014-04-04 ENCOUNTER — Encounter: Payer: Self-pay | Admitting: Hematology & Oncology

## 2014-04-04 ENCOUNTER — Ambulatory Visit (HOSPITAL_BASED_OUTPATIENT_CLINIC_OR_DEPARTMENT_OTHER): Payer: BLUE CROSS/BLUE SHIELD | Admitting: Hematology & Oncology

## 2014-04-04 ENCOUNTER — Ambulatory Visit (HOSPITAL_BASED_OUTPATIENT_CLINIC_OR_DEPARTMENT_OTHER): Payer: BLUE CROSS/BLUE SHIELD

## 2014-04-04 VITALS — BP 154/91 | HR 98 | Temp 98.2°F | Resp 16 | Ht 59.0 in | Wt 223.0 lb

## 2014-04-04 DIAGNOSIS — C7B02 Secondary carcinoid tumors of liver: Secondary | ICD-10-CM

## 2014-04-04 DIAGNOSIS — C7B8 Other secondary neuroendocrine tumors: Secondary | ICD-10-CM

## 2014-04-04 DIAGNOSIS — C7A00 Malignant carcinoid tumor of unspecified site: Secondary | ICD-10-CM

## 2014-04-04 LAB — CBC WITH DIFFERENTIAL (CANCER CENTER ONLY)
BASO#: 0 10*3/uL (ref 0.0–0.2)
BASO%: 0.4 % (ref 0.0–2.0)
EOS ABS: 0.2 10*3/uL (ref 0.0–0.5)
EOS%: 1.9 % (ref 0.0–7.0)
HCT: 37.3 % (ref 34.8–46.6)
HEMOGLOBIN: 12.3 g/dL (ref 11.6–15.9)
LYMPH#: 2.3 10*3/uL (ref 0.9–3.3)
LYMPH%: 26.7 % (ref 14.0–48.0)
MCH: 29.4 pg (ref 26.0–34.0)
MCHC: 33 g/dL (ref 32.0–36.0)
MCV: 89 fL (ref 81–101)
MONO#: 0.5 10*3/uL (ref 0.1–0.9)
MONO%: 6.4 % (ref 0.0–13.0)
NEUT#: 5.5 10*3/uL (ref 1.5–6.5)
NEUT%: 64.6 % (ref 39.6–80.0)
Platelets: 242 10*3/uL (ref 145–400)
RBC: 4.18 10*6/uL (ref 3.70–5.32)
RDW: 13.9 % (ref 11.1–15.7)
WBC: 8.5 10*3/uL (ref 3.9–10.0)

## 2014-04-04 MED ORDER — LANREOTIDE ACETATE 120 MG/0.5ML ~~LOC~~ SOLN
120.0000 mg | Freq: Once | SUBCUTANEOUS | Status: AC
  Administered 2014-04-04: 120 mg via SUBCUTANEOUS
  Filled 2014-04-04: qty 120

## 2014-04-04 NOTE — Patient Instructions (Signed)
Lanreotide injection What is this medicine? LANREOTIDE (lan REE oh tide) is used to reduce blood levels of growth hormone in patients with a condition called acromegaly. It also works to slow or stop tumor growth in patients with gastroenteropancreatic neuroendocrine tumor (GEP-NET). This medicine may be used for other purposes; ask your health care provider or pharmacist if you have questions. COMMON BRAND NAME(S): Somatuline Depot What should I tell my health care provider before I take this medicine? They need to know if you have any of these conditions: -diabetes -gallbladder disease -heart disease -kidney disease -liver disease -an unusual or allergic reaction to lanreotide, other medicines, latex, foods, dyes, or preservatives -pregnant or trying to get pregnant -breast-feeding How should I use this medicine? This medicine is for injection under the skin. It is given by a health care professional in a hospital or clinic setting. Contact your pediatrician or health care professional regarding the use of this medicine in children. Special care may be needed. Overdosage: If you think you have taken too much of this medicine contact a poison control center or emergency room at once. NOTE: This medicine is only for you. Do not share this medicine with others. What if I miss a dose? It is important not to miss your dose. Call your doctor or health care professional if you are unable to keep an appointment. What may interact with this medicine? -bromocriptine -cyclosporine -medicines for diabetes, including insulin -medicines for heart disease or hypertension -quinidine This list may not describe all possible interactions. Give your health care provider a list of all the medicines, herbs, non-prescription drugs, or dietary supplements you use. Also tell them if you smoke, drink alcohol, or use illegal drugs. Some items may interact with your medicine. What should I watch for while using  this medicine? Visit your doctor or health care professional for regular checks on your progress. Your condition will be monitored carefully while you are receiving this medicine. This medicine may cause increases or decreases in blood sugar. Signs of high blood sugar include frequent urination, unusual thirst, flushed or dry skin, difficulty breathing, drowsiness, stomach ache, nausea, vomiting or dry mouth. Signs of low blood sugar include chills, cool, pale skin or cold sweats, drowsiness, extreme hunger, fast heartbeat, headache, nausea, nervousness or anxiety, shakiness, trembling, unsteadiness, tiredness, or weakness. Contact your doctor or health care professional right away if you experience any of these symptoms. What side effects may I notice from receiving this medicine? Side effects that you should report to your doctor or health care professional as soon as possible: -allergic reactions like skin rash, itching or hives, swelling of the face, lips, or tongue -changes in blood sugar -changes in heart rate -severe stomach pain Side effects that usually do not require medical attention (report to your doctor or health care professional if they continue or are bothersome): -diarrhea or constipation -gas or stomach pain -nausea, vomiting -pain, redness, swelling and irritation at site where injected This list may not describe all possible side effects. Call your doctor for medical advice about side effects. You may report side effects to FDA at 1-800-FDA-1088. Where should I keep my medicine? This drug is given in a hospital or clinic and will not be stored at home. NOTE: This sheet is a summary. It may not cover all possible information. If you have questions about this medicine, talk to your doctor, pharmacist, or health care provider.  2015, Elsevier/Gold Standard. (2013-01-26 17:43:04)

## 2014-04-04 NOTE — Progress Notes (Signed)
[place]  Telephone:(336) [phone] Fax:(336) [phone]  ID: Leah [name] OB: [birth year] MR#: [id number] YQM#:[id number] Patient Care Team: Leah Alar, NP as PCP - General (Internal Medicine)  DIAGNOSIS: Metastatic neuroendocrine carcinoma-carcinoid  INTERVAL HISTORY: Leah [name] is here today for follow-up and injection. She has been having some upper right sided discomfort that comes and goes. She was in the ED 2 weeks ago for dark tarry stool and vomitting.  She has no more vomiting or tarry stools since then. Her Hgb today is 12.3 MCV 89.  She is scheduled for an MRI of the abdomen at [facility] at the end of March.  She is doing ok on Somatuline. In January, her chromagranin was 7. In July, her 5-HIAA level was 2.0.  She denies fever, chills, n/v, cough, rash, headache, dizziness, SOB, chest pain, palpitations, constipation, diarrhea, blood in urine or stool. No episodes of bleeding. No new aches or pains.  No swelling, tenderness, numbness or tingling in her extremities.  Her appetite is good and she stays hydrated. Her weight is stable at 223 lbs.   CURRENT TREATMENT: Somatuline 120 mg subcutaneous q. 4 weeks   REVIEW OF SYSTEMS: All other 10 point review of systems is negative.   PAST MEDICAL HISTORY: Past Medical History  Diagnosis Date  . Sarcoidosis 2008  . Allergic rhinitis   . GE reflux   . Neuroendocrine tumor 2011  . Surgical menopause   . Migraines   . Atrophic vaginitis   . STD (sexually transmitted disease)     HSV I  . Hepatic encephalopathy 2011  . HTN (hypertension) 2011  . Carcinoid syndrome   . Borderline diabetes 03/17/2013  . Orthostatic hypotension   . Palpitations    PAST SURGICAL HISTORY: Past Surgical History  Procedure Laterality Date  . Tonsillectomy  1976  . Cesarean section  [id number]  . Cholecystectomy  2006  . Abdominal hysterectomy  2004  . Oophorectomy  2004  . Mastectomy  2008    bilateral  . Bilateral  mastectectomy Bilateral 2008  . Latissimus dorsi graft  2008    used for reconstructive breast surgery  . Hepatetetomy    . Tubal ligation  2001    BTL  . Breast surgery      bilateral masectomy and reconstruction with silicone implants  . Knee arthroscopy Left 09/2010   FAMILY HISTORY Family History  Problem Relation Age of Onset  . Arthritis Maternal Grandmother   . Arthritis Maternal Grandfather   . Arthritis Paternal Grandmother   . Arthritis Paternal Grandfather   . Heart failure Paternal Grandfather   . Breast cancer Mother 6    died at 18  . Lupus Mother   . Heart failure Father   . Hypertension Father   . Hyperlipidemia Father   . Breast cancer Maternal Aunt 38    deceased at 21 recurrence  . Ovarian cancer Maternal Aunt 41     died with in 46 months  . Breast cancer Maternal Aunt 40    died at age 37   GYNECOLOGIC HISTORY:  No LMP recorded. Patient has had a hysterectomy.   SOCIAL HISTORY:  History   Social History  . Marital Status: Married    Spouse Name: N/A  . Number of Children: N/A  . Years of Education: N/A   Occupational History  . Not on file.   Social History Main Topics  . Smoking status: Never Smoker   . Smokeless tobacco: Never Used  Comment: never used tobacco  . Alcohol Use: No  . Drug Use: No  . Sexual Activity: Yes    Birth Control/ Protection: Surgical     Comment: hysterectomy   Other Topics Concern  . Not on file   Social History Narrative   Married to Leah [name]   2 sons- 2001 Leah [name] and  Leah [name] (1999)   Has dog   She is unemployed, has worked in the past (office work)   Completed some college   Enjoys Theatre manager   ADVANCED DIRECTIVES: <no information>  HEALTH MAINTENANCE: History  Substance Use Topics  . Smoking status: Never Smoker   . Smokeless tobacco: Never Used     Comment: never used tobacco  . Alcohol Use: No   Colonoscopy: PAP: Bone density: Lipid panel:  Allergies  Allergen Reactions  .  Osphena [Ospemifene]     Spiked blood pressure    Current Outpatient Prescriptions  Medication Sig Dispense Refill  . albuterol (PROVENTIL HFA;VENTOLIN HFA) 108 (90 BASE) MCG/ACT inhaler Inhale 2 puffs into the lungs every 6 (six) hours as needed. For shortness of breath.    Marland Kitchen amLODipine (NORVASC) 10 MG tablet Take 1 tablet (10 mg total) by mouth daily. 90 tablet 0  . [facility] ALLERGY RELIEF 180 MG tablet TAKE 1 TABLET BY MOUTH EVERY DAY 30 tablet 11  . Diclofenac Sodium (PENNSAID) 1.5 % SOLN Place 40 drops onto the skin 4 (four) times daily as needed. For joint pain.    . fluticasone (FLONASE) 50 MCG/ACT nasal spray USE 2 SPRAYS IN BOTH NOSTRILS EVERY DAY (Patient taking differently: USE 2 SPRAYS IN BOTH NOSTRILS EVERY DAY as needed for congestion) 16 g 0  . montelukast (SINGULAIR) 10 MG tablet TAKE 1 TABLET BY MOUTH AT BEDTIME 30 tablet 0  . Multiple Vitamin (MULTI VITAMIN DAILY PO) Take by mouth every morning.    . naproxen (NAPROSYN) 500 MG tablet Take 1 tablet by mouth 2 (two) times daily.    . ondansetron (ZOFRAN) 4 MG tablet Take 4 mg by mouth every 8 (eight) hours as needed. For nausea.    . pantoprazole (PROTONIX) 40 MG tablet Take 1 tablet (40 mg total) by mouth 2 (two) times daily. 60 tablet 3  . valACYclovir (VALTREX) 500 MG tablet Take 500 mg by mouth 2 (two) times daily as needed. Cold sores    . valsartan (DIOVAN) 320 MG tablet Take 1 tablet (320 mg total) by mouth daily. 30 tablet 2  . venlafaxine XR (EFFEXOR-XR) 150 MG 24 hr capsule Take 1 capsule (150 mg total) by mouth daily. 90 capsule 0  . Ospemifene (OSPHENA) 60 MG TABS Take 1 tablet by mouth daily. (Patient not taking: Reported on 03/21/2014) 90 tablet 3  . triamcinolone cream (KENALOG) 0.1 % APPLY ON RASH THREE TIMES A DAY (Patient not taking: Reported on 04/04/2014) 30 g 2   No current facility-administered medications for this visit.   OBJECTIVE: Filed Vitals:   04/04/14 1042  BP: 154/91  Pulse: 98  Temp: 98.2 F (36.8  C)  Resp: 16   Body mass index is 45.02 kg/(m^2). ECOG FS:0 - Asymptomatic Ocular: Sclerae unicteric, pupils equal, round and reactive to light Ear-nose-throat: Oropharynx clear, dentition fair Lymphatic: No cervical or supraclavicular adenopathy Lungs no rales or rhonchi, good excursion bilaterally Heart regular rate and rhythm, no murmur appreciated Abd soft, nontender, positive bowel sounds MSK no focal spinal tenderness, no joint edema Neuro: non-focal, well-oriented, appropriate affect Breasts: Deferred  LAB RESULTS: CMP  Component Value Date/Time   NA 137 03/21/2014 1824   NA 139 02/07/2014 1019   K 3.6 03/21/2014 1824   K 4.1 02/07/2014 1019   CL 103 03/21/2014 1824   CL 103 02/07/2014 1019   CO2 26 03/21/2014 1824   CO2 28 02/07/2014 1019   GLUCOSE 116* 03/21/2014 1824   GLUCOSE 117 02/07/2014 1019   BUN 22 03/21/2014 1824   BUN 11 02/07/2014 1019   CREATININE 0.75 03/21/2014 1824   CREATININE 0.7 02/07/2014 1019   CALCIUM 8.6 03/21/2014 1824   CALCIUM 9.1 02/07/2014 1019   PROT 7.3 03/21/2014 1824   PROT 7.1 02/07/2014 1019   ALBUMIN 4.0 03/21/2014 1824   AST 31 03/21/2014 1824   AST 32 02/07/2014 1019   ALT 45* 03/21/2014 1824   ALT 36 02/07/2014 1019   ALKPHOS 122* 03/21/2014 1824   ALKPHOS 98* 02/07/2014 1019   BILITOT 0.6 03/21/2014 1824   BILITOT 0.50 02/07/2014 1019   GFRNONAA >90 03/21/2014 1824   GFRAA >90 03/21/2014 1824   No results found for: SPEP Lab Results  Component Value Date   WBC 8.5 04/04/2014   NEUTROABS 5.5 04/04/2014   HGB 12.3 04/04/2014   HCT 37.3 04/04/2014   MCV 89 04/04/2014   PLT 242 04/04/2014   No results found for: LABCA2 No components found for: LABCA125 No results for input(s): INR in the last 168 hours.  STUDIES: No results found.  ASSESSMENT/PLAN: Ms. Scinto is 54 year old white female with metastatic carcinoid. She is doing ok on Somatuline. She is feeling better but still has right upper abdominal pain  periodically.  She will have her injection today as planned.  Her CBC looked good today. We will see what the rest of her labs show.  She has all her imaging done at [facility] and is scheduled for an MRI of the abdomen in March.  We will see her back in 2 months for labs and follow up.  She knows to call with any questions or concerns and to go to the ED in the event of an emergency. We can certainly see her sooner if need be.   Eliezer Bottom, NP 04/04/2014 11:37 AM

## 2014-04-07 LAB — COMPREHENSIVE METABOLIC PANEL
ALT: 63 U/L — AB (ref 0–35)
AST: 41 U/L — AB (ref 0–37)
Albumin: 3.8 g/dL (ref 3.5–5.2)
Alkaline Phosphatase: 115 U/L (ref 39–117)
BILIRUBIN TOTAL: 0.5 mg/dL (ref 0.2–1.2)
BUN: 14 mg/dL (ref 6–23)
CO2: 27 mEq/L (ref 19–32)
Calcium: 8.9 mg/dL (ref 8.4–10.5)
Chloride: 102 mEq/L (ref 96–112)
Creatinine, Ser: 0.7 mg/dL (ref 0.50–1.10)
Glucose, Bld: 134 mg/dL — ABNORMAL HIGH (ref 70–99)
Potassium: 3.7 mEq/L (ref 3.5–5.3)
SODIUM: 139 meq/L (ref 135–145)
Total Protein: 6.6 g/dL (ref 6.0–8.3)

## 2014-04-07 LAB — CHROMOGRANIN A: Chromogranin A: 8 ng/mL (ref <=15)

## 2014-04-12 ENCOUNTER — Other Ambulatory Visit: Payer: Self-pay | Admitting: Family

## 2014-04-17 ENCOUNTER — Encounter: Payer: Self-pay | Admitting: Family

## 2014-04-17 ENCOUNTER — Emergency Department (HOSPITAL_COMMUNITY): Payer: BLUE CROSS/BLUE SHIELD

## 2014-04-17 ENCOUNTER — Telehealth: Payer: Self-pay | Admitting: *Deleted

## 2014-04-17 ENCOUNTER — Encounter (HOSPITAL_COMMUNITY): Payer: Self-pay | Admitting: Emergency Medicine

## 2014-04-17 ENCOUNTER — Inpatient Hospital Stay (HOSPITAL_COMMUNITY)
Admission: EM | Admit: 2014-04-17 | Discharge: 2014-04-20 | DRG: 193 | Disposition: A | Discharge status: Home / Self Care | Payer: BLUE CROSS/BLUE SHIELD | Attending: Internal Medicine | Admitting: Internal Medicine

## 2014-04-17 DIAGNOSIS — I152 Hypertension secondary to endocrine disorders: Secondary | ICD-10-CM | POA: Diagnosis present

## 2014-04-17 DIAGNOSIS — E340 Carcinoid syndrome: Secondary | ICD-10-CM | POA: Diagnosis present

## 2014-04-17 DIAGNOSIS — Z9013 Acquired absence of bilateral breasts and nipples: Secondary | ICD-10-CM

## 2014-04-17 DIAGNOSIS — R0602 Shortness of breath: Secondary | ICD-10-CM | POA: Diagnosis not present

## 2014-04-17 DIAGNOSIS — R079 Chest pain, unspecified: Secondary | ICD-10-CM

## 2014-04-17 DIAGNOSIS — C7A8 Other malignant neuroendocrine tumors: Secondary | ICD-10-CM | POA: Diagnosis present

## 2014-04-17 DIAGNOSIS — J101 Influenza due to other identified influenza virus with other respiratory manifestations: Secondary | ICD-10-CM | POA: Diagnosis not present

## 2014-04-17 DIAGNOSIS — D869 Sarcoidosis, unspecified: Secondary | ICD-10-CM | POA: Diagnosis present

## 2014-04-17 DIAGNOSIS — R0902 Hypoxemia: Secondary | ICD-10-CM

## 2014-04-17 DIAGNOSIS — K219 Gastro-esophageal reflux disease without esophagitis: Secondary | ICD-10-CM | POA: Diagnosis present

## 2014-04-17 DIAGNOSIS — J309 Allergic rhinitis, unspecified: Secondary | ICD-10-CM | POA: Diagnosis present

## 2014-04-17 DIAGNOSIS — Z79899 Other long term (current) drug therapy: Secondary | ICD-10-CM

## 2014-04-17 DIAGNOSIS — C7B8 Other secondary neuroendocrine tumors: Secondary | ICD-10-CM | POA: Diagnosis present

## 2014-04-17 DIAGNOSIS — C787 Secondary malignant neoplasm of liver and intrahepatic bile duct: Secondary | ICD-10-CM | POA: Diagnosis present

## 2014-04-17 DIAGNOSIS — Z862 Personal history of diseases of the blood and blood-forming organs and certain disorders involving the immune mechanism: Secondary | ICD-10-CM | POA: Insufficient documentation

## 2014-04-17 DIAGNOSIS — R911 Solitary pulmonary nodule: Secondary | ICD-10-CM | POA: Diagnosis present

## 2014-04-17 DIAGNOSIS — I10 Essential (primary) hypertension: Secondary | ICD-10-CM | POA: Diagnosis present

## 2014-04-17 DIAGNOSIS — J9601 Acute respiratory failure with hypoxia: Secondary | ICD-10-CM | POA: Diagnosis present

## 2014-04-17 DIAGNOSIS — E1159 Type 2 diabetes mellitus with other circulatory complications: Secondary | ICD-10-CM | POA: Diagnosis present

## 2014-04-17 DIAGNOSIS — Z23 Encounter for immunization: Secondary | ICD-10-CM

## 2014-04-17 DIAGNOSIS — R000 Tachycardia, unspecified: Secondary | ICD-10-CM

## 2014-04-17 LAB — CBC
HCT: 39.3 % (ref 36.0–46.0)
Hemoglobin: 12.8 g/dL (ref 12.0–15.0)
MCH: 29.2 pg (ref 26.0–34.0)
MCHC: 32.6 g/dL (ref 30.0–36.0)
MCV: 89.5 fL (ref 78.0–100.0)
PLATELETS: 213 10*3/uL (ref 150–400)
RBC: 4.39 MIL/uL (ref 3.87–5.11)
RDW: 14.3 % (ref 11.5–15.5)
WBC: 6.5 10*3/uL (ref 4.0–10.5)

## 2014-04-17 LAB — BASIC METABOLIC PANEL
Anion gap: 9 (ref 5–15)
BUN: 9 mg/dL (ref 6–23)
CALCIUM: 8.9 mg/dL (ref 8.4–10.5)
CO2: 26 mmol/L (ref 19–32)
CREATININE: 0.73 mg/dL (ref 0.50–1.10)
Chloride: 101 mmol/L (ref 96–112)
GFR calc Af Amer: >90 mL/min (ref >90)
GFR calc non Af Amer: >90 mL/min (ref >90)
Glucose, Bld: 131 mg/dL — ABNORMAL HIGH (ref 70–99)
Potassium: 4.1 mmol/L (ref 3.5–5.1)
Sodium: 136 mmol/L (ref 135–145)

## 2014-04-17 LAB — I-STAT CG4 LACTIC ACID, ED
Lactic Acid, Venous: 1.99 mmol/L (ref 0.5–2.0)
Lactic Acid, Venous: 1.44 mmol/L (ref 0.5–2.0)

## 2014-04-17 LAB — BRAIN NATRIURETIC PEPTIDE: B Natriuretic Peptide: 24.1 pg/mL (ref 0.0–100.0)

## 2014-04-17 LAB — I-STAT TROPONIN, ED: Troponin i, poc: 0 ng/mL (ref 0.00–0.08)

## 2014-04-17 MED ORDER — VALSARTAN 320 MG PO TABS: 320.0000 mg | ORAL_TABLET | Freq: Every day | ORAL | Status: DC

## 2014-04-17 MED ORDER — IPRATROPIUM-ALBUTEROL 0.5-2.5 (3) MG/3ML IN SOLN
3.0000 mL | Freq: Once | RESPIRATORY_TRACT | Status: AC
  Administered 2014-04-17: 3 mL via RESPIRATORY_TRACT
  Filled 2014-04-17: qty 3

## 2014-04-17 MED ORDER — PANTOPRAZOLE SODIUM 40 MG PO TBEC: 40.0000 mg | DELAYED_RELEASE_TABLET | Freq: Two times a day (BID) | ORAL | Status: DC

## 2014-04-17 MED ORDER — ONDANSETRON 4 MG PO TBDP
4.0000 mg | ORAL_TABLET | Freq: Once | ORAL | Status: AC
  Administered 2014-04-17: 4 mg via ORAL
  Filled 2014-04-17: qty 1

## 2014-04-17 MED ORDER — SODIUM CHLORIDE 0.9 % IV BOLUS (SEPSIS)
1000.0000 mL | Freq: Once | INTRAVENOUS | Status: AC
  Administered 2014-04-17: 1000 mL via INTRAVENOUS

## 2014-04-17 MED ORDER — HYDROCODONE-ACETAMINOPHEN 5-325 MG PO TABS
2.0000 | ORAL_TABLET | Freq: Once | ORAL | Status: AC
  Administered 2014-04-17: 2 via ORAL
  Filled 2014-04-17: qty 2

## 2014-04-17 MED ORDER — IOHEXOL 350 MG/ML SOLN
100.0000 mL | Freq: Once | INTRAVENOUS | Status: AC | PRN
  Administered 2014-04-17: 100 mL via INTRAVENOUS

## 2014-04-17 NOTE — Telephone Encounter (Signed)
Received fax requests from [facility] for valsartan 320mg  and pantoprazole 40mg . Refills sent.

## 2014-04-17 NOTE — ED Provider Notes (Signed)
CSN: [id number]     Arrival date & time 04/17/14  1822 History   First MD Initiated Contact with Patient 04/17/14 1928     Chief Complaint  Patient presents with  . Dizziness  . Shortness of Breath     (Consider location/radiation/quality/duration/timing/severity/associated sxs/prior Treatment) HPI  Leah [name] is a 54 y.o. female with PMH of neuroendocrine carcinoid syndrome with metastases to liver status post resection, sarcoid presenting with URI symptoms since yesterday including cough productive of thin mucous, chest pain after coughing as well as generalized pain and pressure. Patient states her highest fever was 101102. She's been taking over-the-counter cold medicines with minimal relief. Patient denies nausea, changes in stool or black stool. She states at times she coughs to the point of vomiting. She is followed by her oncologist and was seen February 23 and given her octreotide. Pt denies history of DVT, PE, recent surgery or trauma, hemoptysis, exogenous estrogen use, unilateral leg swelling or tenderness, immobilization.    Past Medical History  Diagnosis Date  . Sarcoidosis 2008  . Allergic rhinitis   . GE reflux   . Neuroendocrine tumor 2011  . Surgical menopause   . Migraines   . Atrophic vaginitis   . STD (sexually transmitted disease)     HSV I  . Hepatic encephalopathy 2011  . HTN (hypertension) 2011  . Carcinoid syndrome   . Borderline diabetes 03/17/2013  . Orthostatic hypotension   . Palpitations    Past Surgical History  Procedure Laterality Date  . Tonsillectomy  1976  . Cesarean section  [id number]  . Cholecystectomy  2006  . Abdominal hysterectomy  2004  . Oophorectomy  2004  . Mastectomy  2008    bilateral  . Bilateral mastectectomy Bilateral 2008  . Latissimus dorsi graft  2008    used for reconstructive breast surgery  . Hepatetetomy    . Tubal ligation  2001    BTL  . Breast surgery      bilateral masectomy and reconstruction with  silicone implants  . Knee arthroscopy Left 09/2010   Family History  Problem Relation Age of Onset  . Arthritis Maternal Grandmother   . Arthritis Maternal Grandfather   . Arthritis Paternal Grandmother   . Arthritis Paternal Grandfather   . Heart failure Paternal Grandfather   . Breast cancer Mother 69    died at 53  . Lupus Mother   . Heart failure Father   . Hypertension Father   . Hyperlipidemia Father   . Breast cancer Maternal Aunt 38    deceased at 11 recurrence  . Ovarian cancer Maternal Aunt 56     died with in 74 months  . Breast cancer Maternal Aunt 40    died at age 29   History  Substance Use Topics  . Smoking status: Never Smoker   . Smokeless tobacco: Never Used     Comment: never used tobacco  . Alcohol Use: No   OB History    Gravida Para Term Preterm AB TAB SAB Ectopic Multiple Living   2 2 2       2      Review of Systems 10 Systems reviewed and are negative for acute change except as noted in the HPI.    Allergies  Review of patient's allergies indicates no known allergies.  Home Medications   Prior to Admission medications   Medication Sig Start Date End Date Taking? Authorizing Provider  albuterol (PROVENTIL HFA;VENTOLIN HFA) 108 (90 BASE) MCG/ACT inhaler  Inhale 2 puffs into the lungs every 6 (six) hours as needed. For shortness of breath.   Yes Historical Provider, MD  amLODipine (NORVASC) 10 MG tablet TAKE 1 TABLET DAILY 04/12/14  Yes [name], NP  [facility] ALLERGY RELIEF 180 MG tablet TAKE 1 TABLET BY MOUTH EVERY DAY 12/24/13  Yes Elby Showers, MD  Diclofenac Sodium (PENNSAID) 1.5 % SOLN Place 40 drops onto the skin 4 (four) times daily as needed. For joint pain.   Yes Historical Provider, MD  fluticasone (FLONASE) 50 MCG/ACT nasal spray USE 2 SPRAYS IN BOTH NOSTRILS EVERY DAY Patient taking differently: USE 2 SPRAYS IN BOTH NOSTRILS EVERY DAY as needed for congestion 02/06/14  Yes [name], MD  montelukast (SINGULAIR) 10 MG tablet  TAKE 1 TABLET AT BEDTIME 04/12/14  Yes [name], NP  naproxen (NAPROSYN) 500 MG tablet Take 1 tablet by mouth 2 (two) times daily. 08/12/12  Yes Historical Provider, MD  ondansetron (ZOFRAN) 4 MG tablet Take 4 mg by mouth every 8 (eight) hours as needed. For nausea. 02/26/11  Yes Historical Provider, MD  Ospemifene (OSPHENA) 60 MG TABS Take 1 tablet by mouth daily. 09/07/12  Yes Antonietta Barcelona, FNP  pantoprazole (PROTONIX) 40 MG tablet Take 1 tablet (40 mg total) by mouth 2 (two) times daily. 04/17/14  Yes [name], NP  valACYclovir (VALTREX) 500 MG tablet Take 500 mg by mouth 2 (two) times daily as needed. Cold sores 07/15/12  Yes Antonietta Barcelona, FNP  valsartan (DIOVAN) 320 MG tablet Take 1 tablet (320 mg total) by mouth daily. 04/17/14  Yes [name], NP  venlafaxine XR (EFFEXOR-XR) 150 MG 24 hr capsule TAKE 1 CAPSULE DAILY 04/12/14  Yes [name], NP  Multiple Vitamin (MULTI VITAMIN DAILY PO) Take by mouth every morning.    Historical Provider, MD  triamcinolone cream (KENALOG) 0.1 % APPLY ON RASH THREE TIMES A DAY Patient not taking: Reported on 04/04/2014    Elby Showers, MD   BP 138/88 mmHg  Pulse 104  Temp(Src) 99 F (37.2 C) (Oral)  Resp 20  SpO2 91% Physical Exam  Constitutional: She appears well-developed and well-nourished. No distress.  HENT:  Head: Normocephalic and atraumatic.  Eyes: Conjunctivae and EOM are normal. Right eye exhibits no discharge. Left eye exhibits no discharge.  Neck: No JVD present.  Cardiovascular: Normal rate and regular rhythm.   1+ pitting edema equal bilaterally. Negative Homan's sign.  Pulmonary/Chest: Effort normal and breath sounds normal. No respiratory distress. She has no wheezes.  Abdominal: Soft. Bowel sounds are normal. She exhibits no distension. There is no tenderness.  Neurological: She is alert. She exhibits normal muscle tone. Coordination normal.  Skin: Skin is warm and dry. She is not diaphoretic.   Nursing note and vitals reviewed.   ED Course  Procedures (including critical care time) Labs Review Labs Reviewed  BASIC METABOLIC PANEL - Abnormal; Notable for the following:    Glucose, Bld 131 (*)    All other components within normal limits  BLOOD GAS, ARTERIAL - Abnormal; Notable for the following:    pO2, Arterial 72.4 (*)    Bicarbonate 24.6 (*)    All other components within normal limits  CBC  BRAIN NATRIURETIC PEPTIDE  I-STAT TROPOININ, ED  I-STAT CG4 LACTIC ACID, ED  I-STAT CG4 LACTIC ACID, ED    Imaging Review Dg Chest 2 View  04/17/2014   CLINICAL DATA:  Nausea, vomiting, cough for 2 days  EXAM: CHEST  2  VIEW  COMPARISON:  October 06, 2012  FINDINGS: The mediastinal contour is normal. The heart size is mildly enlarged. There is no focal infiltrate, pulmonary edema, or pleural effusion. Posterior surgical clips are identified unchanged. The visualized skeletal structures are unremarkable.  IMPRESSION: No active cardiopulmonary disease.  Mild cardiomegaly.   Electronically Signed   By: Abelardo Diesel M.D.   On: 04/17/2014 19:36   Ct Angio Chest Pe W/cm &/or Wo Cm  04/17/2014   CLINICAL DATA:  Tachycardia. Evaluate for pulmonary embolus. Patient reports shortness of breath, cough, and chest pain. 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  EXAM: CT ANGIOGRAPHY CHEST WITH CONTRAST  TECHNIQUE: Multidetector CT imaging of the chest was performed using the standard protocol during bolus administration of intravenous contrast. Multiplanar CT image reconstructions and MIPs were obtained to evaluate the vascular anatomy.  CONTRAST:  169mL OMNIPAQUE IOHEXOL 350 MG/ML SOLN  COMPARISON:  Chest radiographs earlier this day.  FINDINGS: There are no filling defects within the pulmonary arteries to suggest pulmonary embolus, distal-most subsegmental branches are suboptimally assessed.  Hypoventilatory changes at the lung bases. There is a 3 mm  subpleural nodule in the periphery of the left lower lobe, image 45/76 series 8. Small focal subpleural ground-glass opacity in the right middle lobe image 39/76 series 8, this is nonspecific, may simply represent atelectasis. No parenchymal consolidation or dominant pulmonary mass.  The heart is mildly enlarged. The thoracic aorta is normal in caliber with mild atherosclerosis. No pleural or pericardial effusion.  There are multiple small mediastinal lymph nodes. For example prevascular lymph node measures 8 mm. Right upper paratracheal lymph node measures 7 mm. There is a paraesophageal lymph node at the esophageal hiatus measuring 7 mm.  Evaluation of the upper abdomen demonstrates postsurgical change in the right lobe of the liver. There is diffusely decreased hepatic density. Patient is post bilateral mastectomy with breast implants. Peripherally calcified density anterior to left breast implant is likely fat necrosis. Small bilateral axillary lymph nodes without pathologic adenopathy.  There are no lytic or blastic osseous lesions. No acute osseous abnormality.  Review of the MIP images confirms the above findings.  IMPRESSION: 1. No pulmonary embolus. 2. Subpleural 3 mm nodule in the left lower lobe. There are shotty mediastinal lymph nodes, which may be reactive or related to history of sarcoidosis. No prior chest CT available for comparison. Given history of malignancy, follow-up chest CT at 1 year is recommended. This recommendation follows the consensus statement: Guidelines for Management of Small Pulmonary Nodules Detected on CT Scans: A Statement from the [place] as published in Radiology 2005; 237:395-400.   Electronically Signed   By: Jeb Levering M.D.   On: 04/17/2014 22:57     EKG Interpretation   Date/Time:  Monday April 17 2014 19:04:13 EST Ventricular Rate:  134 PR Interval:  106 QRS Duration: 76 QT Interval:  360 QTC Calculation: 537 R Axis:   27 Text Interpretation:   Sinus tachycardia Low voltage, precordial leads  Nonspecific T abnormalities, diffuse leads , new since last tracing  Prolonged QT interval Since last tracing rate faster Confirmed by KNAPP   MD-J, JON ([id number]) on 04/17/2014 9:47:52 PM      MDM   Final diagnoses:  Tachycardia  Hypoxia  Shortness of breath   Patient with past medical history of carcinoid octreotide presenting with 2 day history of shortness of breath as well as chest pain. Patient with tachycardia and tachypnea. Initial oxygen  saturations normal. Patient given breathing treatment with improvement of her symptoms. Patient also given IV fluids. She did have improvement of her tachycardia however her oxygen saturations decreased to 85%. She was placed on 2 L nasal cannula with oxygen saturations above 90. Chest x-ray without acute abnormalities. Lab workup unremarkable. Negative troponin and lactic acid. Concern for patient's persistent tachycardia with no wheezing on exam. CTA without evidence of PE. Incidental finding of pulmonary nodule that was discussed with patient as well as follow-up on an outpatient basis and 6 months to one year. Patient states she is feeling better and tachycardia reduced to 104. However trial to decrease oxygen, she developed hypoxia. ABG ordered. Pt with PO2 arterial of 72.4 on [place]. A-a O2 gradient of 34.2. Concerned for pt persistent hypoxia and SOB. Consult to hospitalist for admission. Spoke with Dr. Blaine Hamper who agrees to evaluate patient with plan for admission.  Discussed all results and patient verbalizes understanding and agrees with plan.  This is a shared patient. This patient was discussed with the physician who saw and evaluated the patient and agrees with the plan.   Al Corpus, PA-C 04/18/14 2993  Dorie Rank, MD 04/18/14 [phone]

## 2014-04-17 NOTE — ED Notes (Signed)
Pt with Hx of metastatic endocrine cancer with mets to liver c/o SOB, cough, chest pain, generalized pain, dizziness. Denies any current chemotherapy or radiation therapy, pt is taking tumor-reducing sando-statin medication injections which causes immunocompromise.

## 2014-04-18 ENCOUNTER — Encounter (HOSPITAL_COMMUNITY): Payer: Self-pay | Admitting: *Deleted

## 2014-04-18 DIAGNOSIS — R0602 Shortness of breath: Secondary | ICD-10-CM | POA: Diagnosis present

## 2014-04-18 DIAGNOSIS — R911 Solitary pulmonary nodule: Secondary | ICD-10-CM | POA: Diagnosis present

## 2014-04-18 DIAGNOSIS — Z23 Encounter for immunization: Secondary | ICD-10-CM | POA: Diagnosis not present

## 2014-04-18 DIAGNOSIS — K219 Gastro-esophageal reflux disease without esophagitis: Secondary | ICD-10-CM | POA: Diagnosis present

## 2014-04-18 DIAGNOSIS — C787 Secondary malignant neoplasm of liver and intrahepatic bile duct: Secondary | ICD-10-CM | POA: Diagnosis present

## 2014-04-18 DIAGNOSIS — E340 Carcinoid syndrome: Secondary | ICD-10-CM | POA: Diagnosis present

## 2014-04-18 DIAGNOSIS — D869 Sarcoidosis, unspecified: Secondary | ICD-10-CM | POA: Diagnosis present

## 2014-04-18 DIAGNOSIS — Z862 Personal history of diseases of the blood and blood-forming organs and certain disorders involving the immune mechanism: Secondary | ICD-10-CM | POA: Insufficient documentation

## 2014-04-18 DIAGNOSIS — Z79899 Other long term (current) drug therapy: Secondary | ICD-10-CM | POA: Diagnosis not present

## 2014-04-18 DIAGNOSIS — J309 Allergic rhinitis, unspecified: Secondary | ICD-10-CM | POA: Diagnosis present

## 2014-04-18 DIAGNOSIS — C7A8 Other malignant neuroendocrine tumors: Secondary | ICD-10-CM | POA: Diagnosis present

## 2014-04-18 DIAGNOSIS — I10 Essential (primary) hypertension: Secondary | ICD-10-CM | POA: Diagnosis present

## 2014-04-18 DIAGNOSIS — J9601 Acute respiratory failure with hypoxia: Secondary | ICD-10-CM | POA: Diagnosis present

## 2014-04-18 DIAGNOSIS — C7B8 Other secondary neuroendocrine tumors: Secondary | ICD-10-CM

## 2014-04-18 DIAGNOSIS — J101 Influenza due to other identified influenza virus with other respiratory manifestations: Secondary | ICD-10-CM | POA: Diagnosis present

## 2014-04-18 LAB — BLOOD GAS, ARTERIAL
ACID-BASE EXCESS: 0.6 mmol/L (ref 0.0–2.0)
BICARBONATE: 24.6 meq/L — AB (ref 20.0–24.0)
Drawn by: 11249
O2 Content: 2 L/min
O2 SAT: 93.7 %
PCO2 ART: 40.2 mmHg (ref 35.0–45.0)
PH ART: 7.406 (ref 7.350–7.450)
PO2 ART: 72.4 mmHg — AB (ref 80.0–100.0)
Patient temperature: 99.6
TCO2: 22.3 mmol/L (ref 0–100)

## 2014-04-18 LAB — GLUCOSE, CAPILLARY: Glucose-Capillary: 273 mg/dL — ABNORMAL HIGH (ref 70–99)

## 2014-04-18 LAB — INFLUENZA PANEL BY PCR (TYPE A & B)
H1N1FLUPCR: DETECTED — AB
INFLAPCR: POSITIVE — AB
INFLBPCR: NEGATIVE

## 2014-04-18 LAB — HIV ANTIBODY (ROUTINE TESTING W REFLEX): HIV SCREEN 4TH GENERATION: NONREACTIVE

## 2014-04-18 MED ORDER — AMLODIPINE BESYLATE 10 MG PO TABS
10.0000 mg | ORAL_TABLET | Freq: Every day | ORAL | Status: DC
  Administered 2014-04-18 – 2014-04-20 (×3): 10 mg via ORAL
  Filled 2014-04-18 (×3): qty 1

## 2014-04-18 MED ORDER — IRBESARTAN 300 MG PO TABS
300.0000 mg | ORAL_TABLET | Freq: Every day | ORAL | Status: DC
  Administered 2014-04-18 – 2014-04-20 (×3): 300 mg via ORAL
  Filled 2014-04-18 (×3): qty 1

## 2014-04-18 MED ORDER — METHYLPREDNISOLONE SODIUM SUCC 125 MG IJ SOLR
60.0000 mg | Freq: Two times a day (BID) | INTRAMUSCULAR | Status: DC
  Administered 2014-04-18 – 2014-04-19 (×3): 60 mg via INTRAVENOUS
  Filled 2014-04-18 (×3): qty 0.96

## 2014-04-18 MED ORDER — DICLOFENAC SODIUM 1 % TD GEL
2.0000 g | Freq: Four times a day (QID) | TRANSDERMAL | Status: DC | PRN
  Filled 2014-04-18: qty 100

## 2014-04-18 MED ORDER — HYDROCODONE-ACETAMINOPHEN 5-325 MG PO TABS: 2.0000 | ORAL_TABLET | Freq: Four times a day (QID) | ORAL | Status: DC | PRN

## 2014-04-18 MED ORDER — FLUTICASONE PROPIONATE 50 MCG/ACT NA SUSP
1.0000 | Freq: Every day | NASAL | Status: DC
  Administered 2014-04-18 – 2014-04-20 (×3): 1 via NASAL
  Filled 2014-04-18: qty 16

## 2014-04-18 MED ORDER — HEPARIN SODIUM (PORCINE) 5000 UNIT/ML IJ SOLN
5000.0000 [IU] | Freq: Three times a day (TID) | INTRAMUSCULAR | Status: DC
  Administered 2014-04-18 – 2014-04-20 (×7): 5000 [IU] via SUBCUTANEOUS
  Filled 2014-04-18 (×8): qty 1

## 2014-04-18 MED ORDER — ADULT MULTIVITAMIN W/MINERALS CH
1.0000 | ORAL_TABLET | Freq: Every morning | ORAL | Status: DC
  Administered 2014-04-18 – 2014-04-20 (×3): 1 via ORAL
  Filled 2014-04-18 (×3): qty 1

## 2014-04-18 MED ORDER — NAPROXEN 500 MG PO TABS
500.0000 mg | ORAL_TABLET | Freq: Two times a day (BID) | ORAL | Status: DC
  Administered 2014-04-18 – 2014-04-20 (×5): 500 mg via ORAL
  Filled 2014-04-18 (×5): qty 1

## 2014-04-18 MED ORDER — AZITHROMYCIN 500 MG PO TABS
500.0000 mg | ORAL_TABLET | Freq: Every day | ORAL | Status: AC
  Administered 2014-04-18: 500 mg via ORAL
  Filled 2014-04-18: qty 1

## 2014-04-18 MED ORDER — SODIUM CHLORIDE 0.9 % IV SOLN
INTRAVENOUS | Status: DC
  Administered 2014-04-18: 04:00:00 via INTRAVENOUS

## 2014-04-18 MED ORDER — IPRATROPIUM-ALBUTEROL 0.5-2.5 (3) MG/3ML IN SOLN
3.0000 mL | Freq: Four times a day (QID) | RESPIRATORY_TRACT | Status: DC | PRN
  Administered 2014-04-18: 3 mL via RESPIRATORY_TRACT
  Filled 2014-04-18: qty 3

## 2014-04-18 MED ORDER — MONTELUKAST SODIUM 10 MG PO TABS
10.0000 mg | ORAL_TABLET | Freq: Every day | ORAL | Status: DC
  Administered 2014-04-18 – 2014-04-19 (×2): 10 mg via ORAL
  Filled 2014-04-18 (×2): qty 1

## 2014-04-18 MED ORDER — AZITHROMYCIN 250 MG PO TABS
250.0000 mg | ORAL_TABLET | Freq: Every day | ORAL | Status: DC
  Administered 2014-04-19 – 2014-04-20 (×2): 250 mg via ORAL
  Filled 2014-04-18 (×2): qty 1

## 2014-04-18 MED ORDER — PANTOPRAZOLE SODIUM 40 MG PO TBEC
40.0000 mg | DELAYED_RELEASE_TABLET | Freq: Two times a day (BID) | ORAL | Status: DC
  Administered 2014-04-18 – 2014-04-20 (×5): 40 mg via ORAL
  Filled 2014-04-18 (×5): qty 1

## 2014-04-18 MED ORDER — ALBUTEROL SULFATE HFA 108 (90 BASE) MCG/ACT IN AERS: 2.0000 | INHALATION_SPRAY | RESPIRATORY_TRACT | Status: DC | PRN

## 2014-04-18 MED ORDER — ONDANSETRON HCL 4 MG/2ML IJ SOLN
4.0000 mg | Freq: Four times a day (QID) | INTRAMUSCULAR | Status: DC | PRN
  Administered 2014-04-18 – 2014-04-19 (×2): 4 mg via INTRAVENOUS
  Filled 2014-04-18 (×2): qty 2

## 2014-04-18 MED ORDER — DM-GUAIFENESIN ER 30-600 MG PO TB12
1.0000 | ORAL_TABLET | Freq: Two times a day (BID) | ORAL | Status: DC
  Administered 2014-04-18 – 2014-04-19 (×3): 1 via ORAL
  Filled 2014-04-18 (×3): qty 1

## 2014-04-18 MED ORDER — PNEUMOCOCCAL VAC POLYVALENT 25 MCG/0.5ML IJ INJ
0.5000 mL | INJECTION | INTRAMUSCULAR | Status: AC
  Administered 2014-04-19: 0.5 mL via INTRAMUSCULAR
  Filled 2014-04-18 (×2): qty 0.5

## 2014-04-18 MED ORDER — OSELTAMIVIR PHOSPHATE 75 MG PO CAPS
75.0000 mg | ORAL_CAPSULE | Freq: Two times a day (BID) | ORAL | Status: DC
  Administered 2014-04-18 – 2014-04-20 (×5): 75 mg via ORAL
  Filled 2014-04-18 (×7): qty 1

## 2014-04-18 MED ORDER — LORATADINE 10 MG PO TABS
10.0000 mg | ORAL_TABLET | Freq: Every day | ORAL | Status: DC
  Administered 2014-04-18 – 2014-04-20 (×3): 10 mg via ORAL
  Filled 2014-04-18 (×3): qty 1

## 2014-04-18 NOTE — H&P (Signed)
[facility] History and Physical  Leah [name]:[id number] DOB: [birth year] DOA: 04/17/2014  Referring physician: ED physician PCP: [name], NP  Specialists:   Chief Complaint: Cough, shortness of breath, chest pain, fever  HPI: Leah [name] is a 54 y.o. 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  Patient reports that she started having cough and shortness of breath yesterday. She also has fever with temperature 100.2 at home. He coughs up white colored mucus. She also has a chest pain which is located in the right upper chest. The chest pain is pleuritic and is aggravated by coughing. She does not have runny nose or sore throat. She's been taking over-the-counter cold medicines with minimal relief. She has nausea, but no vomiting, abdominal pain or diarrhea. Patient denies dysuria, urgency, frequency, hematuria, skin rashes, or leg swelling. No unilateral weakness, numbness or tingling sensations. No vision change or hearing loss. Patient was found to have oxygen desaturation to 85% in the emergency room, which improved to 91% on nasal cannula oxygen 2 L.  In ED, patient was found to have negative chest x-ray acute abnormalities. Negative CT angiogram for pulmonary embolism. No leukocytosis. Troponin negative. BNP 24.1. Lactate 1.99. Temperature 99. Tachycardia. EKG showed prolongation of QTc interval 537. ABG showed a pH of 7.4, PCO2 40, PO2 72.4%. Patient is admitted to inpatient for further evaluation and treatment.  CTA IMPRESSION:  1. No pulmonary embolus. 2. Subpleural 3 mm nodule in the left lower lobe. There are shotty mediastinal lymph nodes, which may be reactive or related to history of sarcoidosis. No prior chest CT available for comparison. Given history of malignancy, follow-up chest CT at 1 year is  recommended.   Review of Systems: As presented in the history of presenting illness, rest negative.  Where does patient live? At home Can patient participate in ADLs? Yes  Allergy: No Known Allergies  Past Medical History  Diagnosis Date  . Sarcoidosis 2008  . Allergic rhinitis   . GE reflux   . Neuroendocrine tumor 2011  . Surgical menopause   . Migraines   . Atrophic vaginitis   . STD (sexually transmitted disease)     HSV I  . Hepatic encephalopathy 2011  . HTN (hypertension) 2011  . Carcinoid syndrome   . Borderline diabetes 03/17/2013  . Orthostatic hypotension   . Palpitations     Past Surgical History  Procedure Laterality Date  . Tonsillectomy  1976  . Cesarean section  [id number]  . Cholecystectomy  2006  . Abdominal hysterectomy  2004  . Oophorectomy  2004  . Mastectomy  2008    bilateral  . Bilateral mastectectomy Bilateral 2008  . Latissimus dorsi graft  2008    used for reconstructive breast surgery  . Hepatetetomy    . Tubal ligation  2001    BTL  . Breast surgery      bilateral masectomy and reconstruction with silicone implants  . Knee arthroscopy Left 09/2010    Social History:  reports that she has never smoked. She has never used smokeless tobacco. She reports that she does not drink alcohol or use illicit drugs.  Family History:  Family History  Problem Relation Age of Onset  . Arthritis Maternal Grandmother   . Arthritis Maternal Grandfather   . Arthritis Paternal Grandmother   . Arthritis Paternal Grandfather   . Heart failure Paternal  Grandfather   . Breast cancer Mother 35    died at 28  . Lupus Mother   . Heart failure Father   . Hypertension Father   . Hyperlipidemia Father   . Breast cancer Maternal Aunt 38    deceased at 50 recurrence  . Ovarian cancer Maternal Aunt 33     died with in 37 months  . Breast cancer Maternal Aunt 45    died at age 57     Prior to Admission medications   Medication Sig Start Date End Date  Taking? Authorizing Provider  albuterol (PROVENTIL HFA;VENTOLIN HFA) 108 (90 BASE) MCG/ACT inhaler Inhale 2 puffs into the lungs every 6 (six) hours as needed. For shortness of breath.   Yes Historical Provider, MD  amLODipine (NORVASC) 10 MG tablet TAKE 1 TABLET DAILY 04/12/14  Yes [name], NP  [facility] ALLERGY RELIEF 180 MG tablet TAKE 1 TABLET BY MOUTH EVERY DAY 12/24/13  Yes Elby Showers, MD  Diclofenac Sodium (PENNSAID) 1.5 % SOLN Place 40 drops onto the skin 4 (four) times daily as needed. For joint pain.   Yes Historical Provider, MD  fluticasone (FLONASE) 50 MCG/ACT nasal spray USE 2 SPRAYS IN BOTH NOSTRILS EVERY DAY Patient taking differently: USE 2 SPRAYS IN BOTH NOSTRILS EVERY DAY as needed for congestion 02/06/14  Yes [name], MD  montelukast (SINGULAIR) 10 MG tablet TAKE 1 TABLET AT BEDTIME 04/12/14  Yes [name], NP  naproxen (NAPROSYN) 500 MG tablet Take 1 tablet by mouth 2 (two) times daily. 08/12/12  Yes Historical Provider, MD  ondansetron (ZOFRAN) 4 MG tablet Take 4 mg by mouth every 8 (eight) hours as needed. For nausea. 02/26/11  Yes Historical Provider, MD  Ospemifene (OSPHENA) 60 MG TABS Take 1 tablet by mouth daily. 09/07/12  Yes Antonietta Barcelona, FNP  pantoprazole (PROTONIX) 40 MG tablet Take 1 tablet (40 mg total) by mouth 2 (two) times daily. 04/17/14  Yes [name], NP  valACYclovir (VALTREX) 500 MG tablet Take 500 mg by mouth 2 (two) times daily as needed. Cold sores 07/15/12  Yes Antonietta Barcelona, FNP  valsartan (DIOVAN) 320 MG tablet Take 1 tablet (320 mg total) by mouth daily. 04/17/14  Yes [name], NP  venlafaxine XR (EFFEXOR-XR) 150 MG 24 hr capsule TAKE 1 CAPSULE DAILY 04/12/14  Yes [name], NP  Multiple Vitamin (MULTI VITAMIN DAILY PO) Take by mouth every morning.    Historical Provider, MD  triamcinolone cream (KENALOG) 0.1 % APPLY ON RASH THREE TIMES A DAY Patient not taking: Reported on 04/04/2014    Elby Showers, MD     Physical Exam: Filed Vitals:   04/17/14 2022 04/17/14 2128 04/17/14 2145 04/17/14 2331  BP:  131/60  138/88  Pulse:  113 110 104  Temp:      TempSrc:      Resp:  14 15 20   SpO2: 95% 91% 94% 91%   General: Not in acute distress HEENT:       Eyes: PERRL, EOMI, no scleral icterus       ENT: No discharge from the ears and nose, no pharynx injection, no tonsillar enlargement.        Neck: No JVD, no bruit, no mass felt. Cardiac: S1/S2, RRR, No murmurs, No gallops or rubs Pulm: Coarse breathing sounds bilaterally. No rales, wheezing, rhonchi or rubs. Abd: Soft, nondistended, nontender, no rebound pain, no organomegaly, BS present Ext: No edema bilaterally. 2+DP/PT pulse bilaterally Musculoskeletal: No joint deformities,  erythema, or stiffness, ROM full Skin: No rashes.  Neuro: Alert and oriented X3, cranial nerves II-XII grossly intact, muscle strength 5/5 in all extremeties, sensation to light touch intact.  Psych: Patient is not psychotic, no suicidal or hemocidal ideation.  Labs on Admission:  Basic Metabolic Panel:  Recent Labs Lab 04/17/14 1905  NA 136  K 4.1  CL 101  CO2 26  GLUCOSE 131*  BUN 9  CREATININE 0.73  CALCIUM 8.9   Liver Function Tests: No results for input(s): AST, ALT, ALKPHOS, BILITOT, PROT, ALBUMIN in the last 168 hours. No results for input(s): LIPASE, AMYLASE in the last 168 hours. No results for input(s): AMMONIA in the last 168 hours. CBC:  Recent Labs Lab 04/17/14 1905  WBC 6.5  HGB 12.8  HCT 39.3  MCV 89.5  PLT 213   Cardiac Enzymes: No results for input(s): CKTOTAL, CKMB, CKMBINDEX, TROPONINI in the last 168 hours.  BNP (last 3 results)  Recent Labs  04/17/14 1905  BNP 24.1    ProBNP (last 3 results) No results for input(s): PROBNP in the last 8760 hours.  CBG: No results for input(s): GLUCAP in the last 168 hours.  Radiological Exams on Admission: Dg Chest 2 View  04/17/2014   CLINICAL DATA:  Nausea, vomiting, cough  for 2 days  EXAM: CHEST  2 VIEW  COMPARISON:  October 06, 2012  FINDINGS: The mediastinal contour is normal. The heart size is mildly enlarged. There is no focal infiltrate, pulmonary edema, or pleural effusion. Posterior surgical clips are identified unchanged. The visualized skeletal structures are unremarkable.  IMPRESSION: No active cardiopulmonary disease.  Mild cardiomegaly.   Electronically Signed   By: Abelardo Diesel M.D.   On: 04/17/2014 19:36   Ct Angio Chest Pe W/cm &/or Wo Cm  04/17/2014   CLINICAL DATA:  Tachycardia. Evaluate for pulmonary embolus. Patient reports shortness of breath, cough, and chest pain. 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  EXAM: CT ANGIOGRAPHY CHEST WITH CONTRAST  TECHNIQUE: Multidetector CT imaging of the chest was performed using the standard protocol during bolus administration of intravenous contrast. Multiplanar CT image reconstructions and MIPs were obtained to evaluate the vascular anatomy.  CONTRAST:  160mL OMNIPAQUE IOHEXOL 350 MG/ML SOLN  COMPARISON:  Chest radiographs earlier this day.  FINDINGS: There are no filling defects within the pulmonary arteries to suggest pulmonary embolus, distal-most subsegmental branches are suboptimally assessed.  Hypoventilatory changes at the lung bases. There is a 3 mm subpleural nodule in the periphery of the left lower lobe, image 45/76 series 8. Small focal subpleural ground-glass opacity in the right middle lobe image 39/76 series 8, this is nonspecific, may simply represent atelectasis. No parenchymal consolidation or dominant pulmonary mass.  The heart is mildly enlarged. The thoracic aorta is normal in caliber with mild atherosclerosis. No pleural or pericardial effusion.  There are multiple small mediastinal lymph nodes. For example prevascular lymph node measures 8 mm. Right upper paratracheal lymph node measures 7 mm. There is a paraesophageal lymph node at the esophageal  hiatus measuring 7 mm.  Evaluation of the upper abdomen demonstrates postsurgical change in the right lobe of the liver. There is diffusely decreased hepatic density. Patient is post bilateral mastectomy with breast implants. Peripherally calcified density anterior to left breast implant is likely fat necrosis. Small bilateral axillary lymph nodes without pathologic adenopathy.  There are no lytic or blastic osseous lesions. No acute osseous abnormality.  Review of the MIP images  confirms the above findings.  IMPRESSION: 1. No pulmonary embolus. 2. Subpleural 3 mm nodule in the left lower lobe. There are shotty mediastinal lymph nodes, which may be reactive or related to history of sarcoidosis. No prior chest CT available for comparison. Given history of malignancy, follow-up chest CT at 1 year is recommended. This recommendation follows the consensus statement: Guidelines for Management of Small Pulmonary Nodules Detected on CT Scans: A Statement from the [place] as published in Radiology 2005; 237:395-400.   Electronically Signed   By: Jeb Levering M.D.   On: 04/17/2014 22:57    EKG: Independently reviewed. QTC 537.  Assessment/Plan Principal Problem:   SOB (shortness of breath) Active Problems:   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   Hypertension   Allergic rhinitis   History of bilateral mastectomy   Sarcoidosis  Shortness of breath, cough, chest pain, fever: Chest x-ray is negative for infiltration. CT angiogram is negative for pulmonary embolism. It is likely due to bronchitis.  - will admit to tele bed - Empiric Azithromycin for bronchitis  - Mucinex for cough - IVF: NS 125cc/h - Urine legionella and S. pneumococcal antigen - Albuterol Nebulizer prn and DuoNeb for SOB - solumedrol 60 mg q12h - Follow up blood culture x2, sputum culture, respiratory virus panel and flu PCR - Percocet of her chest pain  Sarcoidosis: Diagnosed on 2007 or 2008 per patient.  She has not been treated. Calcium is 8.9 on the patient. CTA showed subpleural 3 mm nodule in the left lower lobe. There are shotty mediastinal lymph nodes, which may be reactive or related to history of sarcoidosis per radiologist. - may giver referral to pulmonologist at discharge.  Neuroendocrine carcinoid syndrome with metastases to liver: status post 2/3 of liver resection 2011. No radiation or chemotherapy. Patient is currently followed up Dr. Marin Olp. Last Zosyn was 3 weeks ago. She is being treated with octreotide. -Follow-up with Dr. Marin Olp  HTN : -Continue amlodipine and Irbesartan  GERD:  -Protonix  Prolongation QTc interval: QTC 537. Patient is on Effexor and Zofran at home. - hold Effexor and a Zofran  - repeat EKG morning   DVT ppx: SQ Heparin      Code Status: Full code Family Communication:  Yes, patient's    husband   at bed side Disposition Plan: Admit to inpatient   Date of Service 04/18/2014    Ivor Costa [facility] Pager [phone]  If 7PM-7AM, please contact night-coverage [email or web address] Password [email or web address] 04/18/2014, 1:04 AM

## 2014-04-18 NOTE — Progress Notes (Signed)
Flu test pos Will start [facility] [phone]

## 2014-04-18 NOTE — ED Notes (Signed)
RT paged for ABG

## 2014-04-18 NOTE — Progress Notes (Signed)
Patient ID: Leah [name], female   DOB: 1960-03-07, 54 y.o.   MRN: [id number] [facility] PROGRESS NOTE  EMREE LOCICERO OQH:[id number] DOB: [birth year] DOA: 04/17/2014 PCP: [name], NP  Brief narrative:    Addendum to admission note done 04/18/2014. 54 year old female with past medical history significant for neuroendocrine carcinoid syndrome with metastasis to the liver, sarcoidosis, hypertension who presented to [facility] with worsening shortness of breath, cough productive of whitish sputum and associated fevers at home. Patient reported having chest pain in the right upper chest area which was worse with breathing and coughing. On admission, patient was hemodynamically stable. Chest x-ray did not reveal acute cardiopulmonary findings. CT angiogram of the chest was negative for pulmonary embolism but there was a 3 mm left lower lobe nodule noted on CT scan which will require outpatient follow-up to monitor for stability of the nodule.   Assessment/Plan:    Principal Problem: Acute respiratory failure with hypoxia / history of sarcoidosis - Patient was found to have oxygen saturation 91% with nasal cannula oxygen support on the admission. Her respiratory status is better this morning but she still has some shortness of breath and wheezing. - CT angiogram of the chest was negative for pulmonary embolism. No acute cardiopulmonary findings are seen on chest x-ray. - Patient was started empirically on azithromycin. We will continue this for total of 5 days, the first dose was given 04/18/2014. - Continue Solu-Medrol 60 mg IV twice daily. - Continue Duoneb every 6 hours as needed for shortness of breath or wheezing  Active Problems: 3 mm left lower lobe lung nodule - Identified on CT scan on the admission. Needs outpatient follow-up to monitor for stability.  Essential hypertension - Continue Norvasc 10 mg daily, divalproex 300 mg daily   DVT Prophylaxis  -  Heparin subcutaneous ordered.   Code Status: Full.  Family Communication:  plan of care discussed with the patient Disposition Plan: Patient still short of breath and wheezing this morning. We will continue treatment with steroids for next 24 hours. Hopefully discharge tomorrow morning.  IV access:  Peripheral IV  Procedures and diagnostic studies:    Dg Chest 2 View 04/17/2014  No active cardiopulmonary disease.  Mild cardiomegaly.   Ct Angio Chest Pe W/cm &/or Wo Cm 04/17/2014   1. No pulmonary embolus. 2. Subpleural 3 mm nodule in the left lower lobe. There are shotty mediastinal lymph nodes, which may be reactive or related to history of sarcoidosis. No prior chest CT available for comparison. Given history of malignancy, follow-up chest CT at 1 year is recommended. This recommendation follows the consensus statement  Medical Consultants:  None  Other Consultants:  None  IAnti-Infectives:   Azithromycin 04/18/2014 -->   [name] Lenz, MD  [facility] Pager [phone]  If 7PM-7AM, please contact night-coverage [email or web address] Password [facility] 04/18/2014, 10:34 AM   LOS: 0 days    HPI/Subjective: No acute overnight events.  Objective: Filed Vitals:   04/17/14 2331 04/18/14 0133 04/18/14 0300 04/18/14 0345  BP: 138/88 152/90 130/82 155/90  Pulse: 104 103 93 98  Temp:    99.3 F (37.4 C)  TempSrc:    Oral  Resp: 20 17 17 20   Height:    4\' 11"  (1.499 m)  Weight:    100.7 kg (222 lb 0.1 oz)  SpO2: 91% 98% 97% 98%    Intake/Output Summary (Last 24 hours) at 04/18/14 1034 Last data filed at 04/18/14 0600  Gross per 24 hour  Intake  125 ml  Output      0 ml  Net    125 ml    Exam:   General:  Pt is alert, follows commands appropriately, not in acute distress  Cardiovascular: Regular rate and rhythm, S1/S2, no murmurs  Respiratory: wheezing in upper lung lobes, no crackles  Abdomen: Soft, non tender, non distended, bowel sounds present  Extremities: No  edema, pulses DP and PT palpable bilaterally  Neuro: Grossly nonfocal  Data Reviewed: Basic Metabolic Panel:  Recent Labs Lab 04/17/14 1905  NA 136  K 4.1  CL 101  CO2 26  GLUCOSE 131*  BUN 9  CREATININE 0.73  CALCIUM 8.9   Liver Function Tests: No results for input(s): AST, ALT, ALKPHOS, BILITOT, PROT, ALBUMIN in the last 168 hours. No results for input(s): LIPASE, AMYLASE in the last 168 hours. No results for input(s): AMMONIA in the last 168 hours. CBC:  Recent Labs Lab 04/17/14 1905  WBC 6.5  HGB 12.8  HCT 39.3  MCV 89.5  PLT 213   Cardiac Enzymes: No results for input(s): CKTOTAL, CKMB, CKMBINDEX, TROPONINI in the last 168 hours. BNP: Invalid input(s): POCBNP CBG: No results for input(s): GLUCAP in the last 168 hours.  No results found for this or any previous visit (from the past 240 hour(s)).   Scheduled Meds: . amLODipine  10 mg Oral Daily  . [START ON 04/19/2014] azithromycin  250 mg Oral Daily  . dextromethorphan-guaiFENesin  1 tablet Oral BID  . fluticasone  1 spray Each Nare Daily  . heparin  5,000 Units Subcutaneous 3 times per day  . irbesartan  300 mg Oral Daily  . loratadine  10 mg Oral Daily  . methylPREDNISolone  60 mg Intravenous BID  . montelukast  10 mg Oral QHS  . multivitamin   1 tablet Oral q morning - 10a  . naproxen  500 mg Oral BID  . pantoprazole  40 mg Oral BID   Continuous Infusions: . sodium chloride 125 mL/hr at 04/18/14 0421

## 2014-04-18 NOTE — Care Management Note (Signed)
    Page 1 of 1   04/18/2014     2:26:37 PM CARE MANAGEMENT NOTE 04/18/2014  Patient:  Leah [name], Leah [name]   Account Number:  [id number]  Date Initiated:  04/18/2014  Documentation initiated by:  Dessa Phi  Subjective/Objective Assessment:   54 y/o f admitted w/sob.Bronchitis.     Action/Plan:   From home.   Anticipated DC Date:  04/20/2014   Anticipated DC Plan:  [place]  CM consult      Choice offered to / List presented to:             Status of service:  In process, will continue to follow Medicare Important Message given?   (If response is "NO", the following Medicare IM given date fields will be blank) Date Medicare IM given:   Medicare IM given by:   Date Additional Medicare IM given:   Additional Medicare IM given by:    Discharge Disposition:    Per UR Regulation:  Reviewed for med. necessity/level of care/duration of stay  If discussed at [place] of Stay Meetings, dates discussed:    Comments:  04/18/14 Dessa Phi RN BSN NCM [phone] No anticipated d/c needs.

## 2014-04-19 DIAGNOSIS — Z9889 Other specified postprocedural states: Secondary | ICD-10-CM

## 2014-04-19 LAB — STREP PNEUMONIAE URINARY ANTIGEN: STREP PNEUMO URINARY ANTIGEN: NEGATIVE

## 2014-04-19 LAB — LEGIONELLA ANTIGEN, URINE

## 2014-04-19 MED ORDER — VENLAFAXINE HCL ER 150 MG PO CP24
150.0000 mg | ORAL_CAPSULE | Freq: Every day | ORAL | Status: DC
  Administered 2014-04-19 – 2014-04-20 (×2): 150 mg via ORAL
  Filled 2014-04-19 (×2): qty 1

## 2014-04-19 MED ORDER — GUAIFENESIN-CODEINE 100-10 MG/5ML PO SOLN: 5.0000 mL | ORAL | Status: DC | PRN

## 2014-04-19 MED ORDER — BENZONATATE 100 MG PO CAPS
100.0000 mg | ORAL_CAPSULE | Freq: Three times a day (TID) | ORAL | Status: DC
  Administered 2014-04-19 – 2014-04-20 (×4): 100 mg via ORAL
  Filled 2014-04-19 (×5): qty 1

## 2014-04-19 MED ORDER — PREDNISONE 20 MG PO TABS
40.0000 mg | ORAL_TABLET | Freq: Every day | ORAL | Status: DC
  Administered 2014-04-20: 40 mg via ORAL
  Filled 2014-04-19 (×2): qty 2

## 2014-04-19 MED ORDER — MENTHOL 3 MG MT LOZG
1.0000 | LOZENGE | OROMUCOSAL | Status: DC | PRN
  Filled 2014-04-19: qty 9

## 2014-04-19 NOTE — Progress Notes (Signed)
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 Patient reported having chest pain in the right upper chest area which was worse with breathing and coughing. On admission, patient was hemodynamically stable. Chest x-ray did not reveal acute cardiopulmonary findings. CT angiogram of the chest was negative for pulmonary embolism but there was a 3 mm left lower lobe nodule noted on CT scan which will require outpatient follow-up to monitor for stability of the nodule. The patient was found positive for influenza A.  Assessment/Plan:  Principal Problem: Acute respiratory failure with hypoxia /influenza A - Influenza panel positive for influenza A, also patient reported that her son also has influenza positive. Rapid strep screen negative, HIV nonreactive, urine strep antigen negative - Overall Improving, no wheezing, O2 sats 97% on 2 L  - CT angiogram of the chest was negative for pulmonary embolism. No acute cardiopulmonary findings are seen on chest x-ray. -Continue Zithromax, DC IV Solu-Medrol. Placed on oral prednisone. Continue scheduled DuoNeb nebs.  - Placed on Tessalon Perles, Robitussin with codeine, Cepacol for cough  Active Problems: Sarcoidosis - Continue oral prednisone  3 mm left lower lobe lung nodule - Identified on CT scan on the admission. Needs outpatient follow-up to monitor for stability.  Essential hypertension - Continue Norvasc 10 mg daily   DVT Prophylaxis: Heparin subcutaneous  Code Status: full code   Family Communication: Discussed in detail with the patient, all  imaging results, lab results explained to the patient  Disposition: Likely DC home in a.m.  Consultants:  None  Procedures:  None  Antibiotics:  Zithromax  Tamiflu started 3/8  Subjective: Patient seen and examined earlier this morning, feeling a whole lot better today, no fevers or chills, dry cough. Patient also reports that 2 of her sons were tested for influenza yesterday and one of them is positive for flu. Patient reports that they had gone to [employer]	[place] for a birthday party prior to admission.  Objective: Weight change:   Intake/Output Summary (Last 24 hours) at 04/19/14 1135 Last data filed at 04/19/14 1100  Gross per 24 hour  Intake    360 ml  Output    150 ml  Net    210 ml   Blood pressure 144/89, pulse 97, temperature 98.8 F (37.1 C), temperature source Oral, resp. rate 19, height 4\' 11"  (1.499 m), weight 100.7 kg (222 lb 0.1 oz), SpO2 97 %.  Physical Exam: General: Alert and awake, oriented x3, not in any acute distress. CVS: S1-S2 clear, no murmur rubs or gallops Chest: Decreased breath sounds at the bases otherwise fairly clear, no wheezing Abdomen: soft nontender, nondistended, normal bowel sounds  Extremities: no cyanosis, clubbing or edema noted bilaterally Neuro: Cranial nerves II-XII intact, no focal neurological deficits  Lab Results: Basic Metabolic Panel:  Recent Labs Lab 04/17/14 1905  NA 136  K 4.1  CL 101  CO2 26  GLUCOSE 131*  BUN 9  CREATININE 0.73  CALCIUM 8.9   Liver Function Tests: No results for input(s): AST, ALT, ALKPHOS, BILITOT, PROT, ALBUMIN in the last 168 hours. No results for input(s): LIPASE, AMYLASE in the last 168 hours.  No results for input(s): AMMONIA in the last 168 hours. CBC:  Recent Labs Lab 04/17/14 1905  WBC 6.5  HGB 12.8  HCT 39.3  MCV 89.5  PLT 213   Cardiac Enzymes: No results for input(s): CKTOTAL, CKMB, CKMBINDEX, TROPONINI in the last 168 hours. BNP: Invalid input(s):  POCBNP CBG:  Recent Labs Lab 04/18/14 2118  GLUCAP 273*     Micro Results: Recent Results (from the past 240 hour(s))  Culture, blood (routine x 2) Call MD if unable to obtain prior to antibiotics being given     Status: None (Preliminary result)   Collection Time: 04/18/14  4:40 AM  Result Value Ref Range Status   Specimen Description BLOOD RIGHT ARM  Final   Special Requests BOTTLES DRAWN AEROBIC ONLY 2CC  Final   Culture   Final           BLOOD CULTURE RECEIVED NO GROWTH TO DATE CULTURE WILL BE HELD FOR 5 DAYS BEFORE ISSUING A FINAL NEGATIVE REPORT Performed at [facility]    Report Status PENDING  Incomplete  Culture, blood (routine x 2) Call MD if unable to obtain prior to antibiotics being given     Status: None (Preliminary result)   Collection Time: 04/18/14  4:47 AM  Result Value Ref Range Status   Specimen Description BLOOD RIGHT HAND  Final   Special Requests BOTTLES DRAWN AEROBIC ONLY 5CC  Final   Culture   Final           BLOOD CULTURE RECEIVED NO GROWTH TO DATE CULTURE WILL BE HELD FOR 5 DAYS BEFORE ISSUING A FINAL NEGATIVE REPORT Performed at [facility]    Report Status PENDING  Incomplete    Studies/Results: Dg Chest 2 View  04/17/2014   CLINICAL DATA:  Nausea, vomiting, cough for 2 days  EXAM: CHEST  2 VIEW  COMPARISON:  October 06, 2012  FINDINGS: The mediastinal contour is normal. The heart size is mildly enlarged. There is no focal infiltrate, pulmonary edema, or pleural effusion. Posterior surgical clips are identified unchanged. The visualized skeletal structures are unremarkable.  IMPRESSION: No active cardiopulmonary disease.  Mild cardiomegaly.   Electronically Signed   By: Abelardo Diesel M.D.   On: 04/17/2014 19:36   Ct Angio Chest Pe W/cm &/or Wo Cm  04/17/2014   CLINICAL DATA:  Tachycardia. Evaluate for pulmonary embolus. Patient reports shortness of breath, cough, and chest pain. 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  EXAM: CT ANGIOGRAPHY CHEST WITH CONTRAST  TECHNIQUE: Multidetector CT imaging of the chest was performed using the standard protocol during bolus administration of intravenous contrast. Multiplanar CT image reconstructions and MIPs were obtained to evaluate the vascular anatomy.  CONTRAST:  135mL OMNIPAQUE IOHEXOL 350 MG/ML SOLN  COMPARISON:  Chest radiographs earlier this day.  FINDINGS: There are no filling defects within the pulmonary arteries to suggest pulmonary embolus, distal-most subsegmental branches are suboptimally assessed.  Hypoventilatory changes at the lung bases. There is a 3 mm subpleural nodule in the periphery of the left lower lobe, image 45/76 series 8. Small focal subpleural ground-glass opacity in the right middle lobe image 39/76 series 8, this is nonspecific, may simply represent atelectasis. No parenchymal consolidation or dominant pulmonary mass.  The heart is mildly enlarged. The thoracic aorta is normal in caliber with mild atherosclerosis. No pleural or pericardial effusion.  There are multiple small mediastinal lymph nodes. For example prevascular lymph node measures 8 mm. Right  upper paratracheal lymph node measures 7 mm. There is a paraesophageal lymph node at the esophageal hiatus measuring 7 mm.  Evaluation of the upper abdomen demonstrates postsurgical change in the right lobe of the liver. There is diffusely decreased hepatic density. Patient is post bilateral mastectomy with breast implants. Peripherally calcified density anterior to left breast implant is likely fat necrosis. Small bilateral axillary lymph nodes without pathologic adenopathy.  There are no lytic or blastic osseous lesions. No acute osseous abnormality.  Review of the MIP images confirms the above findings.  IMPRESSION: 1. No pulmonary embolus. 2. Subpleural 3 mm nodule in the left lower lobe. There are shotty mediastinal lymph nodes, which may be reactive or related to history  of sarcoidosis. No prior chest CT available for comparison. Given history of malignancy, follow-up chest CT at 1 year is recommended. This recommendation follows the consensus statement: Guidelines for Management of Small Pulmonary Nodules Detected on CT Scans: A Statement from the [facility] as published in Radiology 2005; 237:395-400.   Electronically Signed   By: Jeb Levering M.D.   On: 04/17/2014 22:57    Medications: Scheduled Meds: . amLODipine  10 mg Oral Daily  . azithromycin  250 mg Oral Daily  . benzonatate  100 mg Oral TID  . fluticasone  1 spray Each Nare Daily  . heparin  5,000 Units Subcutaneous 3 times per day  . irbesartan  300 mg Oral Daily  . loratadine  10 mg Oral Daily  . montelukast  10 mg Oral QHS  . multivitamin with minerals  1 tablet Oral q morning - 10a  . naproxen  500 mg Oral BID  . oseltamivir  75 mg Oral BID  . pantoprazole  40 mg Oral BID  . predniSONE  40 mg Oral Q breakfast   Time spent 25 minutes   LOS: 1 day   Paquita Printy M.D. [facility] 04/19/2014, 11:35 AM Pager: [phone]  If 7PM-7AM, please contact night-coverage [email or web address] Password [email or web address]

## 2014-04-20 ENCOUNTER — Encounter: Payer: Self-pay | Admitting: Family

## 2014-04-20 ENCOUNTER — Telehealth: Payer: Self-pay | Admitting: Family

## 2014-04-20 DIAGNOSIS — D869 Sarcoidosis, unspecified: Secondary | ICD-10-CM

## 2014-04-20 LAB — BASIC METABOLIC PANEL
Anion gap: 6 (ref 5–15)
BUN: 23 mg/dL (ref 6–23)
CO2: 28 mmol/L (ref 19–32)
Calcium: 8.8 mg/dL (ref 8.4–10.5)
Chloride: 104 mmol/L (ref 96–112)
Creatinine, Ser: 0.94 mg/dL (ref 0.50–1.10)
GFR, EST AFRICAN AMERICAN: 78 mL/min — AB (ref >90)
GFR, EST NON AFRICAN AMERICAN: 67 mL/min — AB (ref >90)
Glucose, Bld: 130 mg/dL — ABNORMAL HIGH (ref 70–99)
POTASSIUM: 4.6 mmol/L (ref 3.5–5.1)
SODIUM: 138 mmol/L (ref 135–145)

## 2014-04-20 LAB — CBC
HEMATOCRIT: 38.7 % (ref 36.0–46.0)
HEMOGLOBIN: 12.2 g/dL (ref 12.0–15.0)
MCH: 28.4 pg (ref 26.0–34.0)
MCHC: 31.5 g/dL (ref 30.0–36.0)
MCV: 90 fL (ref 78.0–100.0)
Platelets: 245 10*3/uL (ref 150–400)
RBC: 4.3 MIL/uL (ref 3.87–5.11)
RDW: 14.3 % (ref 11.5–15.5)
WBC: 9.8 10*3/uL (ref 4.0–10.5)

## 2014-04-20 MED ORDER — BENZONATATE 100 MG PO CAPS: 100.0000 mg | ORAL_CAPSULE | Freq: Three times a day (TID) | ORAL | Status: DC | PRN

## 2014-04-20 MED ORDER — HYDROCODONE-HOMATROPINE 5-1.5 MG/5ML PO SYRP: 5.0000 mL | ORAL_SOLUTION | ORAL | Status: DC | PRN

## 2014-04-20 MED ORDER — PREDNISONE 10 MG PO TABS: ORAL_TABLET | ORAL | Status: DC

## 2014-04-20 MED ORDER — HYDROCODONE-HOMATROPINE 5-1.5 MG/5ML PO SYRP: 5.0000 mL | ORAL_SOLUTION | Freq: Four times a day (QID) | ORAL | Status: DC | PRN

## 2014-04-20 MED ORDER — OSELTAMIVIR PHOSPHATE 75 MG PO CAPS: 75.0000 mg | ORAL_CAPSULE | Freq: Two times a day (BID) | ORAL | Status: DC

## 2014-04-20 MED ORDER — AZITHROMYCIN 250 MG PO TABS: 250.0000 mg | ORAL_TABLET | Freq: Every day | ORAL | Status: DC

## 2014-04-20 NOTE — Telephone Encounter (Signed)
Please contact pt and arrange hospital follow up.

## 2014-04-20 NOTE — Discharge Summary (Signed)
Physician Discharge Summary  Patient ID: Leah [name] MRN: [id number] DOB/AGE: 06/30/1960 54 y.o.  Admit date: 04/17/2014 Discharge date: 04/20/2014  Primary Care Physician:  [name], NP  Discharge Diagnoses:    Acute respiratory failure with hypoxia/influenza A+  . 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 . Hypertension . Allergic rhinitis . Sarcoidosis . SOB (shortness of breath)  Consults:  None   Recommendations for Outpatient Follow-up:  3 mm left lower lobe lung nodule - Identified on CT scan on the admission. Needs outpatient follow-up to monitor for stability  TESTS THAT NEED FOLLOW-UP Follow CBC, BMET   DIET: Heart healthy diet    Allergies:  No Known Allergies   Discharge Medications:   Medication List    STOP taking these medications        ondansetron 4 MG tablet  Commonly known as:  ZOFRAN     Ospemifene 60 MG Tabs  Commonly known as:  OSPHENA     triamcinolone cream 0.1 %  Commonly known as:  KENALOG      TAKE these medications        albuterol 108 (90 BASE) MCG/ACT inhaler  Commonly known as:  PROVENTIL HFA;VENTOLIN HFA  Inhale 2 puffs into the lungs every 6 (six) hours as needed. For shortness of breath.     amLODipine 10 MG tablet  Commonly known as:  NORVASC  TAKE 1 TABLET DAILY     azithromycin 250 MG tablet  Commonly known as:  ZITHROMAX  Take 1 tablet (250 mg total) by mouth daily. X 4 more days     benzonatate 100 MG capsule  Commonly known as:  TESSALON  Take 1 capsule (100 mg total) by mouth 3 (three) times daily as needed for cough. For cough     [employer] ALLERGY RELIEF 180 MG tablet  Generic drug:  fexofenadine  TAKE 1 TABLET BY MOUTH EVERY DAY     fluticasone 50 MCG/ACT nasal spray  Commonly known as:  FLONASE  USE 2 SPRAYS IN BOTH NOSTRILS EVERY DAY     HYDROcodone-homatropine 5-1.5 MG/5ML syrup  Commonly known as:  HYCODAN  Take 5 mLs by mouth every 6 (six) hours as needed for cough.      montelukast 10 MG tablet  Commonly known as:  SINGULAIR  TAKE 1 TABLET AT BEDTIME     MULTI VITAMIN DAILY PO  Take by mouth every morning.     naproxen 500 MG tablet  Commonly known as:  NAPROSYN  Take 1 tablet by mouth 2 (two) times daily.     oseltamivir 75 MG capsule  Commonly known as:  TAMIFLU  Take 1 capsule (75 mg total) by mouth 2 (two) times daily.     pantoprazole 40 MG tablet  Commonly known as:  PROTONIX  Take 1 tablet (40 mg total) by mouth 2 (two) times daily.     PENNSAID 1.5 % Soln  Generic drug:  Diclofenac Sodium  Place 40 drops onto the skin 4 (four) times daily as needed. For joint pain.     predniSONE 10 MG tablet  Commonly known as:  DELTASONE  Prednisone dosing: Take  Prednisone 40mg  (4 tabs) x 1 days, then taper to 30mg  (3 tabs) x 3 days, then 20mg  (2 tabs) x 3days, then 10mg  (1 tab) x 3days, then OFF.  Start taking on:  04/21/2014     valACYclovir 500 MG tablet  Commonly known as:  VALTREX  Take 500 mg by mouth 2 (two) times daily  as needed. Cold sores     valsartan 320 MG tablet  Commonly known as:  DIOVAN  Take 1 tablet (320 mg total) by mouth daily.     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 Patient reported having chest pain in the right upper chest area which was worse with breathing and coughing. On admission, patient was hemodynamically stable. Chest x-ray did not reveal acute cardiopulmonary findings. CT angiogram of the chest was negative for pulmonary embolism but there was a 3 mm left lower lobe nodule noted on  CT scan which will require outpatient follow-up to monitor for stability of the nodule. The patient was found positive for influenza A.  Hospital Course:  Acute respiratory failure with hypoxia /influenza A - Influenza panel positive for influenza A, also patient reported that her son also has influenza positive. Rapid strep screen was negative, HIV nonreactive, urine strep antigen negative. Overall improving, patient has no wheezing, O2 sats 99% on room air at the time of discharge. CT angiogram of the chest was negative for pulmonary embolism. No acute cardiopulmonary findings are seen on chest x-ray. Patient was placed on IV Solu-Medrol and IV Zithromax. Influenza panel was positive for influenza A, patient was started on Tamiflu. Patient will continue oral prednisone with taper and Tamiflu, Zithromax for another 4 days.   Placed on Tessalon Perles,Hycodan, Cepacol for cough  Sarcoidosis - Continue oral prednisone With taper   3 mm left lower lobe lung nodule - Identified on CT scan on the admission. Needs outpatient follow-up to monitor for stability.  Essential hypertension - Continue Norvasc 10 mg daily     Day of Discharge BP 140/90 mmHg  Pulse 76  Temp(Src) 98.5 F (36.9 C) (Oral)  Resp 18  Ht 4\' 11"  (1.499 m)  Wt 100.7 kg (222 lb 0.1 oz)  BMI 44.82 kg/m2  SpO2 99%  Physical Exam: General: Alert and awake oriented x3 not in any acute distress. HEENT: anicteric sclera, pupils reactive to light and accommodation CVS: S1-S2 clear no murmur rubs or gallops Chest: clear to auscultation bilaterally, no wheezing rales or rhonchi Abdomen: soft nontender, nondistended, normal bowel sounds Extremities: no cyanosis, clubbing or edema noted bilaterally Neuro: Cranial nerves II-XII intact, no focal neurological deficits   The results of significant diagnostics from this hospitalization (including imaging, microbiology, ancillary and laboratory) are listed below for reference.     LAB RESULTS: Basic Metabolic Panel:  Recent Labs Lab 04/17/14 1905 04/20/14 0437  NA 136 138  K 4.1 4.6  CL 101 104  CO2 26 28  GLUCOSE 131* 130*  BUN 9 23  CREATININE 0.73 0.94  CALCIUM 8.9 8.8   Liver Function Tests: No results for input(s): AST, ALT, ALKPHOS, BILITOT, PROT, ALBUMIN in the last 168 hours. No results for input(s): LIPASE, AMYLASE in the last 168 hours. No results for input(s): AMMONIA in the last 168 hours. CBC:  Recent Labs Lab 04/17/14 1905 04/20/14 0437  WBC 6.5 9.8  HGB 12.8 12.2  HCT 39.3 38.7  MCV 89.5 90.0  PLT 213 245  Cardiac Enzymes: No results for input(s): CKTOTAL, CKMB, CKMBINDEX, TROPONINI in the last 168 hours. BNP: Invalid input(s): POCBNP CBG:  Recent Labs Lab 04/18/14 2118  GLUCAP 273*    Significant Diagnostic Studies:  Dg Chest 2 View  04/17/2014   CLINICAL DATA:  Nausea, vomiting, cough for 2 days  EXAM: CHEST  2 VIEW  COMPARISON:  October 06, 2012  FINDINGS: The mediastinal contour is normal. The heart size is mildly enlarged. There is no focal infiltrate, pulmonary edema, or pleural effusion. Posterior surgical clips are identified unchanged. The visualized skeletal structures are unremarkable.  IMPRESSION: No active cardiopulmonary disease.  Mild cardiomegaly.   Electronically Signed   By: Abelardo Diesel M.D.   On: 04/17/2014 19:36   Ct Angio Chest Pe W/cm &/or Wo Cm  04/17/2014   CLINICAL DATA:  Tachycardia. Evaluate for pulmonary embolus. Patient reports shortness of breath, cough, and chest pain. 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  EXAM: CT ANGIOGRAPHY CHEST WITH CONTRAST  TECHNIQUE: Multidetector CT imaging of the chest was performed using the standard protocol during bolus administration of intravenous contrast. Multiplanar CT image reconstructions and MIPs were obtained to evaluate the vascular anatomy.  CONTRAST:  170mL OMNIPAQUE IOHEXOL 350 MG/ML SOLN  COMPARISON:   Chest radiographs earlier this day.  FINDINGS: There are no filling defects within the pulmonary arteries to suggest pulmonary embolus, distal-most subsegmental branches are suboptimally assessed.  Hypoventilatory changes at the lung bases. There is a 3 mm subpleural nodule in the periphery of the left lower lobe, image 45/76 series 8. Small focal subpleural ground-glass opacity in the right middle lobe image 39/76 series 8, this is nonspecific, may simply represent atelectasis. No parenchymal consolidation or dominant pulmonary mass.  The heart is mildly enlarged. The thoracic aorta is normal in caliber with mild atherosclerosis. No pleural or pericardial effusion.  There are multiple small mediastinal lymph nodes. For example prevascular lymph node measures 8 mm. Right upper paratracheal lymph node measures 7 mm. There is a paraesophageal lymph node at the esophageal hiatus measuring 7 mm.  Evaluation of the upper abdomen demonstrates postsurgical change in the right lobe of the liver. There is diffusely decreased hepatic density. Patient is post bilateral mastectomy with breast implants. Peripherally calcified density anterior to left breast implant is likely fat necrosis. Small bilateral axillary lymph nodes without pathologic adenopathy.  There are no lytic or blastic osseous lesions. No acute osseous abnormality.  Review of the MIP images confirms the above findings.  IMPRESSION: 1. No pulmonary embolus. 2. Subpleural 3 mm nodule in the left lower lobe. There are shotty mediastinal lymph nodes, which may be reactive or related to history of sarcoidosis. No prior chest CT available for comparison. Given history of malignancy, follow-up chest CT at 1 year is recommended. This recommendation follows the consensus statement: Guidelines for Management of Small Pulmonary Nodules Detected on CT Scans: A Statement from the [place] as published in Radiology 2005; 237:395-400.   Electronically Signed   By:  Jeb Levering M.D.   On: 04/17/2014 22:57    2D ECHO:   Disposition and Follow-up:     Discharge Instructions    Diet - low sodium heart healthy    Complete by:  As directed      Increase activity slowly    Complete by:  As directed             DISPOSITION: Home  DISCHARGE FOLLOW-UP Follow-up Information  Follow up with [name], NP. Schedule an appointment as soon as possible for a visit in 10 days.   Specialty:  Internal Medicine   Why:  for hospital follow-up   Contact information:   [address] [place] [address] [phone]        Time spent on Discharge: 35 mins  Signed:   Jemmie Ledgerwood M.D. [facility] 04/20/2014, 2:58 PM Pager: [phone]

## 2014-04-21 NOTE — Telephone Encounter (Signed)
Admit date: 04/17/2014 Discharge date: 04/20/2014  Reason for admission:  Acute respiratory failure with hypoxia/influenza A+  Transition Care Management Follow-up Telephone Call  How have you been since you were released from the hospital?  Pt states she's feeling better.  Still has a cough, but nonproductive.  No shortness of breath, chest pain, or increased heart rate.  Feels groggy and tired.     Do you understand why you were in the hospital? yes   Do you understand the discharge instructions? yes  Items Reviewed:  Medications reviewed: yes  Allergies reviewed: yes  Dietary changes reviewed: yes  Referrals reviewed: n/a    Functional Questionnaire:   Activities of Daily Living (ADLs):   She states they are independent in the following: ambulation, bathing and hygiene, feeding, continence, grooming, toileting and dressing States they require assistance with the following: family there to assist as needed   Any transportation issues/concerns?: no   Any patient concerns? no   Confirmed importance and date/time of follow-up visits scheduled: yes   Confirmed with patient if condition begins to worsen call PCP or go to the ER: yes   Hospital follow up appointment scheduled for Monday, 05/01/14 @ 8:30 am with [name].

## 2014-04-22 LAB — RESPIRATORY VIRUS PANEL
Adenovirus: NEGATIVE
INFLUENZA B 1: NEGATIVE
Influenza A: POSITIVE — AB
Metapneumovirus: NEGATIVE
PARAINFLUENZA 2 A: NEGATIVE
PARAINFLUENZA 3 A: NEGATIVE
Parainfluenza 1: NEGATIVE
RESPIRATORY SYNCYTIAL VIRUS B: NEGATIVE
Respiratory Syncytial Virus A: NEGATIVE
Rhinovirus: NEGATIVE

## 2014-04-24 LAB — CULTURE, BLOOD (ROUTINE X 2)
CULTURE: NO GROWTH
Culture: NO GROWTH

## 2014-04-25 ENCOUNTER — Ambulatory Visit: Payer: BLUE CROSS/BLUE SHIELD | Admitting: Family

## 2014-05-01 ENCOUNTER — Ambulatory Visit: Payer: BLUE CROSS/BLUE SHIELD | Admitting: Family

## 2014-05-01 ENCOUNTER — Encounter: Payer: Self-pay | Admitting: Family

## 2014-05-01 ENCOUNTER — Ambulatory Visit (INDEPENDENT_AMBULATORY_CARE_PROVIDER_SITE_OTHER): Payer: BLUE CROSS/BLUE SHIELD | Admitting: Family

## 2014-05-01 VITALS — BP 160/104 | HR 105 | Temp 98.4°F | Resp 16 | Ht 59.0 in | Wt 222.4 lb

## 2014-05-01 DIAGNOSIS — J9601 Acute respiratory failure with hypoxia: Secondary | ICD-10-CM

## 2014-05-01 DIAGNOSIS — E119 Type 2 diabetes mellitus without complications: Secondary | ICD-10-CM

## 2014-05-01 DIAGNOSIS — J9600 Acute respiratory failure, unspecified whether with hypoxia or hypercapnia: Secondary | ICD-10-CM | POA: Insufficient documentation

## 2014-05-01 DIAGNOSIS — I10 Essential (primary) hypertension: Secondary | ICD-10-CM

## 2014-05-01 DIAGNOSIS — R911 Solitary pulmonary nodule: Secondary | ICD-10-CM | POA: Insufficient documentation

## 2014-05-01 DIAGNOSIS — E1110 Type 2 diabetes mellitus with ketoacidosis without coma: Secondary | ICD-10-CM

## 2014-05-01 DIAGNOSIS — E1310 Other specified diabetes mellitus with ketoacidosis without coma: Secondary | ICD-10-CM

## 2014-05-01 DIAGNOSIS — J111 Influenza due to unidentified influenza virus with other respiratory manifestations: Secondary | ICD-10-CM

## 2014-05-01 DIAGNOSIS — J1189 Influenza due to unidentified influenza virus with other manifestations: Secondary | ICD-10-CM

## 2014-05-01 DIAGNOSIS — J309 Allergic rhinitis, unspecified: Secondary | ICD-10-CM

## 2014-05-01 LAB — CBC WITH DIFFERENTIAL/PLATELET
BASOS PCT: 0.2 % (ref 0.0–3.0)
Basophils Absolute: 0 10*3/uL (ref 0.0–0.1)
EOS PCT: 0.3 % (ref 0.0–5.0)
Eosinophils Absolute: 0 10*3/uL (ref 0.0–0.7)
HCT: 41.1 % (ref 36.0–46.0)
Hemoglobin: 13.9 g/dL (ref 12.0–15.0)
Lymphocytes Relative: 16.7 % (ref 12.0–46.0)
Lymphs Abs: 2.5 10*3/uL (ref 0.7–4.0)
MCHC: 33.8 g/dL (ref 30.0–36.0)
MCV: 86.5 fl (ref 78.0–100.0)
Monocytes Absolute: 0.8 10*3/uL (ref 0.1–1.0)
Monocytes Relative: 5.4 % (ref 3.0–12.0)
NEUTROS PCT: 77.4 % — AB (ref 43.0–77.0)
Neutro Abs: 11.8 10*3/uL — ABNORMAL HIGH (ref 1.4–7.7)
Platelets: 350 10*3/uL (ref 150.0–400.0)
RBC: 4.75 Mil/uL (ref 3.87–5.11)
RDW: 14.9 % (ref 11.5–15.5)
WBC: 15.2 10*3/uL — AB (ref 4.0–10.5)

## 2014-05-01 LAB — BASIC METABOLIC PANEL
BUN: 14 mg/dL (ref 6–23)
CALCIUM: 9.1 mg/dL (ref 8.4–10.5)
CO2: 24 mEq/L (ref 19–32)
Chloride: 102 mEq/L (ref 96–112)
Creatinine, Ser: 0.69 mg/dL (ref 0.40–1.20)
GFR: 93.84 mL/min (ref >60.00)
Glucose, Bld: 127 mg/dL — ABNORMAL HIGH (ref 70–99)
POTASSIUM: 3.9 meq/L (ref 3.5–5.1)
SODIUM: 137 meq/L (ref 135–145)

## 2014-05-01 LAB — MICROALBUMIN / CREATININE URINE RATIO
Creatinine,U: 207 mg/dL
Microalb Creat Ratio: 4.3 mg/g (ref 0.0–30.0)
Microalb, Ur: 9 mg/dL — ABNORMAL HIGH (ref 0.0–1.9)

## 2014-05-01 MED ORDER — ASPIRIN EC 81 MG PO TBEC: 81.0000 mg | DELAYED_RELEASE_TABLET | Freq: Every day | ORAL | Status: DC

## 2014-05-01 MED ORDER — ALBUTEROL SULFATE HFA 108 (90 BASE) MCG/ACT IN AERS: 2.0000 | INHALATION_SPRAY | Freq: Four times a day (QID) | RESPIRATORY_TRACT | Status: DC | PRN

## 2014-05-01 MED ORDER — METOPROLOL SUCCINATE ER 25 MG PO TB24: 25.0000 mg | ORAL_TABLET | Freq: Every day | ORAL | Status: DC

## 2014-05-01 NOTE — Progress Notes (Signed)
Pre visit review using our clinic review tool, if applicable. No additional management support is needed unless otherwise documented below in the visit note. 

## 2014-05-01 NOTE — Assessment & Plan Note (Signed)
I think that her nasal congestion is due to allergic rhinitis.  Continue antihistamine, singulair, nasal steroid, call if symptoms worsen or if recurrent fever.

## 2014-05-01 NOTE — Assessment & Plan Note (Signed)
Discussed diabetic diet. Add aspirin for cardiac prevention, check urine microalbumin, pneumovax is up to date.

## 2014-05-01 NOTE — Progress Notes (Signed)
Subjective:    Patient ID: Leah [name], female    DOB: 1960/03/09, 54 y.o.   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 Hospital records are reviewed. She was hospitalized 3/7-3/10 with acute respiratory failure secondary to influenza A. She was given oral prednisone with taper while hospitalized.  Reports that she continues to cough and have sinus pressure. Reports low grade fevers at home. Finished zpak and prednisone over the weekend.  Cough is improved. She does report some continued sinus pressure and fatigue but denies fever >101 in the last several days or any colored nasal discharge.    Of note, she had a CT scan which identified a 3 mm LLL lung nodule.  She will need follow up CT scan in 1 year to ensure stability- per radiology recommendations.  BP Readings from Last 3 Encounters:  05/01/14 160/104  04/04/14 154/91  03/24/14 165/96   Lab Results  Component Value Date   HGBA1C 6.6* 03/24/2014   Lab Results  Component Value Date   HGBA1C 6.6* 03/24/2014   HGBA1C 5.8* 06/21/2013   HGBA1C 6.1* 03/14/2013   Lab Results  Component Value Date   LDLCALC 101* 07/22/2012   CREATININE 0.94 04/20/2014       Review of Systems     Past Medical History  Diagnosis Date  . Sarcoidosis 2008  . Allergic rhinitis   . GE reflux   . Neuroendocrine tumor 2011  . Surgical menopause   . Migraines   . Atrophic vaginitis   . STD (sexually transmitted disease)     HSV I  . Hepatic encephalopathy 2011  . HTN (hypertension) 2011  . Carcinoid syndrome   . Borderline diabetes 03/17/2013  . Orthostatic hypotension   . Palpitations     History   Social History  . Marital Status: Married    Spouse Name: N/A  . Number of Children: N/A  . Years of Education: N/A   Occupational History  . Not on file.   Social History Main Topics  . Smoking  status: Never Smoker   . Smokeless tobacco: Never Used     Comment: never used tobacco  . Alcohol Use: No  . Drug Use: No  . Sexual Activity: Yes    Birth Control/ Protection: Surgical     Comment: hysterectomy   Other Topics Concern  . Not on file   Social History Narrative   Married to [place]   2 sons- 2001 [name] (1999)   Has dog   She is unemployed, has worked in the past (office work)   Completed some college   Enjoys Theatre manager    Past Surgical History  Procedure Laterality Date  . Tonsillectomy  1976  . Cesarean section  [id number]  . Cholecystectomy  2006  . Abdominal hysterectomy  2004  . Oophorectomy  2004  . Mastectomy  2008    bilateral  . Bilateral mastectectomy Bilateral 2008  . Latissimus dorsi graft  2008    used for reconstructive breast surgery  . Hepatetetomy    . Tubal ligation  2001    BTL  . Breast surgery      bilateral masectomy and reconstruction with silicone implants  . Knee arthroscopy Left 09/2010    Family History  Problem Relation Age of Onset  . Arthritis Maternal Grandmother   .  Arthritis Maternal Grandfather   . Arthritis Paternal Grandmother   . Arthritis Paternal Grandfather   . Heart failure Paternal Grandfather   . Breast cancer Mother 70    died at 26  . Lupus Mother   . Heart failure Father   . Hypertension Father   . Hyperlipidemia Father   . Breast cancer Maternal Aunt 38    deceased at 51 recurrence  . Ovarian cancer Maternal Aunt 28     died with in 59 months  . Breast cancer Maternal Aunt 40    died at age 43    No Known Allergies  Current Outpatient Prescriptions on File Prior to Visit  Medication Sig Dispense Refill  . albuterol (PROVENTIL HFA;VENTOLIN HFA) 108 (90 BASE) MCG/ACT inhaler Inhale 2 puffs into the lungs every 6 (six) hours as needed. For shortness of breath.    Marland Kitchen amLODipine (NORVASC) 10 MG tablet TAKE 1 TABLET DAILY 90 tablet 1  . [facility] ALLERGY RELIEF 180 MG tablet TAKE 1  TABLET BY MOUTH EVERY DAY 30 tablet 11  . Diclofenac Sodium (PENNSAID) 1.5 % SOLN Place 40 drops onto the skin 4 (four) times daily as needed. For joint pain.    . fluticasone (FLONASE) 50 MCG/ACT nasal spray USE 2 SPRAYS IN BOTH NOSTRILS EVERY DAY (Patient taking differently: USE 2 SPRAYS IN BOTH NOSTRILS EVERY DAY as needed for congestion) 16 g 0  . montelukast (SINGULAIR) 10 MG tablet TAKE 1 TABLET AT BEDTIME 90 tablet 1  . Multiple Vitamin (MULTI VITAMIN DAILY PO) Take by mouth every morning.    . naproxen (NAPROSYN) 500 MG tablet Take 1 tablet by mouth 2 (two) times daily.    . pantoprazole (PROTONIX) 40 MG tablet Take 1 tablet (40 mg total) by mouth 2 (two) times daily. 90 tablet 1  . predniSONE (DELTASONE) 10 MG tablet Prednisone dosing: Take  Prednisone 40mg  (4 tabs) x 1 days, then taper to 30mg  (3 tabs) x 3 days, then 20mg  (2 tabs) x 3days, then 10mg  (1 tab) x 3days, then OFF. 22 tablet 0  . valACYclovir (VALTREX) 500 MG tablet Take 500 mg by mouth 2 (two) times daily as needed. Cold sores    . valsartan (DIOVAN) 320 MG tablet Take 1 tablet (320 mg total) by mouth daily. 90 tablet 1  . venlafaxine XR (EFFEXOR-XR) 150 MG 24 hr capsule TAKE 1 CAPSULE DAILY 90 capsule 1   No current facility-administered medications on file prior to visit.    BP 160/104 mmHg  Pulse 105  Temp(Src) 98.4 F (36.9 C) (Oral)  Resp 16  Ht 4\' 11"  (1.499 m)  Wt 222 lb 6.4 oz (100.88 kg)  BMI 44.90 kg/m2  SpO2 99%    Objective:   Physical Exam  Constitutional: She is oriented to person, place, and time. She appears well-developed and well-nourished.  HENT:  Head: Normocephalic and atraumatic.  Nose: Right sinus exhibits no maxillary sinus tenderness and no frontal sinus tenderness. Left sinus exhibits no maxillary sinus tenderness and no frontal sinus tenderness.  Cardiovascular: Normal rate, regular rhythm and normal heart sounds.   No murmur heard. Pulmonary/Chest: Effort normal and breath sounds  normal. No respiratory distress. She has no wheezes.  Musculoskeletal: She exhibits no edema.  Neurological: She is alert and oriented to person, place, and time.  Psychiatric: She has a normal mood and affect. Her behavior is normal. Judgment and thought content normal.          Assessment & Plan:

## 2014-05-01 NOTE — Assessment & Plan Note (Signed)
Uncontrolled, add beta blocker.

## 2014-05-01 NOTE — Assessment & Plan Note (Signed)
Plan follow up CT chest 3/17.

## 2014-05-01 NOTE — Patient Instructions (Addendum)
Add aspirin 81mg  once daily for cardiac protection. Add Toprol xl 25mg  once daily for blood pressure. Continue allegra, flonase, singulair, call if sinus symptoms worsen or if they do not improve or if you develop fever >100. Follow up in 1 month.

## 2014-05-01 NOTE — Assessment & Plan Note (Addendum)
Resolved.  Lungs clear today. Oxygen saturation is 99% on room air.

## 2014-05-02 ENCOUNTER — Encounter: Payer: Self-pay | Admitting: Nurse Practitioner

## 2014-05-02 ENCOUNTER — Telehealth: Payer: Self-pay | Admitting: Family

## 2014-05-02 ENCOUNTER — Telehealth: Payer: Self-pay | Admitting: *Deleted

## 2014-05-02 ENCOUNTER — Encounter: Payer: Self-pay | Admitting: Hematology & Oncology

## 2014-05-02 DIAGNOSIS — D72829 Elevated white blood cell count, unspecified: Secondary | ICD-10-CM

## 2014-05-02 MED ORDER — ONDANSETRON 4 MG PO TBDP: 4.0000 mg | ORAL_TABLET | Freq: Three times a day (TID) | ORAL | Status: DC | PRN

## 2014-05-02 MED ORDER — ALBUTEROL SULFATE HFA 108 (90 BASE) MCG/ACT IN AERS: 2.0000 | INHALATION_SPRAY | Freq: Four times a day (QID) | RESPIRATORY_TRACT | Status: DC | PRN

## 2014-05-02 MED ORDER — METOPROLOL SUCCINATE ER 25 MG PO TB24: 25.0000 mg | ORAL_TABLET | Freq: Every day | ORAL | Status: DC

## 2014-05-02 NOTE — Telephone Encounter (Signed)
Notified pt and scheduled lab appt for 05/09/14 at 8:15am. Future order entered.

## 2014-05-02 NOTE — Telephone Encounter (Signed)
Notified pt and she voices understanding. 

## 2014-05-02 NOTE — Telephone Encounter (Signed)
Rx sent. Pt should go to ER if n/v do not improve after zofran.

## 2014-05-02 NOTE — Telephone Encounter (Signed)
Leah [name],  I see that pt was just started on metoprolol and #90 x 3 refills went to mail order pharmacy. Did you mean to give her refills for 1 year? Pt is [facility]?  Please advise.

## 2014-05-02 NOTE — Telephone Encounter (Signed)
Caller name: Neriyah Relation to pt: self Call back number: [phone] [facility] [address]  Reason for call:   Patient states that she is still vomiting since yesterday's visit and is requesting that zofran be called in for her

## 2014-05-02 NOTE — Telephone Encounter (Signed)
-----   Message from [name], NP sent at 05/02/2014  7:43 AM EDT ----- Please contact pt and let her know that her white blood cell count is elevated. This is likely due to recent prednisone.  However, I would like her to repeat cbc in 1 week to make sure that it is coming down. Dx leukocytosis.

## 2014-05-02 NOTE — Progress Notes (Signed)
Spoke to patient about how she was feeling. She stated she continues to feel awful. She also sent an email message stating she feels bad. Her labwork indicated an increase in her WBC. She will call back next week to reschedule her injection appointment and hopefully she is feeling much better.

## 2014-05-03 ENCOUNTER — Telehealth: Payer: Self-pay | Admitting: Hematology & Oncology

## 2014-05-03 ENCOUNTER — Ambulatory Visit: Payer: BLUE CROSS/BLUE SHIELD

## 2014-05-03 NOTE — Telephone Encounter (Signed)
Pt made 3-24 inj appointment I sent Baxter Flattery in basket.

## 2014-05-04 ENCOUNTER — Ambulatory Visit: Payer: BLUE CROSS/BLUE SHIELD | Admitting: Family

## 2014-05-04 ENCOUNTER — Ambulatory Visit (HOSPITAL_BASED_OUTPATIENT_CLINIC_OR_DEPARTMENT_OTHER): Payer: BLUE CROSS/BLUE SHIELD

## 2014-05-04 DIAGNOSIS — C7B8 Other secondary neuroendocrine tumors: Secondary | ICD-10-CM

## 2014-05-04 DIAGNOSIS — C7A00 Malignant carcinoid tumor of unspecified site: Secondary | ICD-10-CM

## 2014-05-04 DIAGNOSIS — C7B02 Secondary carcinoid tumors of liver: Secondary | ICD-10-CM

## 2014-05-04 MED ORDER — LANREOTIDE ACETATE 120 MG/0.5ML ~~LOC~~ SOLN
120.0000 mg | Freq: Once | SUBCUTANEOUS | Status: AC
  Administered 2014-05-04: 120 mg via SUBCUTANEOUS
  Filled 2014-05-04: qty 120

## 2014-05-04 NOTE — Patient Instructions (Signed)
Lanreotide injection What is this medicine? LANREOTIDE (lan REE oh tide) is used to reduce blood levels of growth hormone in patients with a condition called acromegaly. It also works to slow or stop tumor growth in patients with gastroenteropancreatic neuroendocrine tumor (GEP-NET). This medicine may be used for other purposes; ask your health care provider or pharmacist if you have questions. COMMON BRAND NAME(S): Somatuline Depot What should I tell my health care provider before I take this medicine? They need to know if you have any of these conditions: -diabetes -gallbladder disease -heart disease -kidney disease -liver disease -an unusual or allergic reaction to lanreotide, other medicines, latex, foods, dyes, or preservatives -pregnant or trying to get pregnant -breast-feeding How should I use this medicine? This medicine is for injection under the skin. It is given by a health care professional in a hospital or clinic setting. Contact your pediatrician or health care professional regarding the use of this medicine in children. Special care may be needed. Overdosage: If you think you have taken too much of this medicine contact a poison control center or emergency room at once. NOTE: This medicine is only for you. Do not share this medicine with others. What if I miss a dose? It is important not to miss your dose. Call your doctor or health care professional if you are unable to keep an appointment. What may interact with this medicine? -bromocriptine -cyclosporine -medicines for diabetes, including insulin -medicines for heart disease or hypertension -quinidine This list may not describe all possible interactions. Give your health care provider a list of all the medicines, herbs, non-prescription drugs, or dietary supplements you use. Also tell them if you smoke, drink alcohol, or use illegal drugs. Some items may interact with your medicine. What should I watch for while using  this medicine? Visit your doctor or health care professional for regular checks on your progress. Your condition will be monitored carefully while you are receiving this medicine. This medicine may cause increases or decreases in blood sugar. Signs of high blood sugar include frequent urination, unusual thirst, flushed or dry skin, difficulty breathing, drowsiness, stomach ache, nausea, vomiting or dry mouth. Signs of low blood sugar include chills, cool, pale skin or cold sweats, drowsiness, extreme hunger, fast heartbeat, headache, nausea, nervousness or anxiety, shakiness, trembling, unsteadiness, tiredness, or weakness. Contact your doctor or health care professional right away if you experience any of these symptoms. What side effects may I notice from receiving this medicine? Side effects that you should report to your doctor or health care professional as soon as possible: -allergic reactions like skin rash, itching or hives, swelling of the face, lips, or tongue -changes in blood sugar -changes in heart rate -severe stomach pain Side effects that usually do not require medical attention (report to your doctor or health care professional if they continue or are bothersome): -diarrhea or constipation -gas or stomach pain -nausea, vomiting -pain, redness, swelling and irritation at site where injected This list may not describe all possible side effects. Call your doctor for medical advice about side effects. You may report side effects to FDA at 1-800-FDA-1088. Where should I keep my medicine? This drug is given in a hospital or clinic and will not be stored at home. NOTE: This sheet is a summary. It may not cover all possible information. If you have questions about this medicine, talk to your doctor, pharmacist, or health care provider.  2015, Elsevier/Gold Standard. (2013-01-26 17:43:04)

## 2014-05-09 ENCOUNTER — Other Ambulatory Visit (INDEPENDENT_AMBULATORY_CARE_PROVIDER_SITE_OTHER): Payer: BLUE CROSS/BLUE SHIELD

## 2014-05-09 DIAGNOSIS — D72829 Elevated white blood cell count, unspecified: Secondary | ICD-10-CM

## 2014-05-09 LAB — CBC WITH DIFFERENTIAL/PLATELET
BASOS ABS: 0.1 10*3/uL (ref 0.0–0.1)
BASOS PCT: 0.5 % (ref 0.0–3.0)
EOS PCT: 2 % (ref 0.0–5.0)
Eosinophils Absolute: 0.2 10*3/uL (ref 0.0–0.7)
HCT: 38.9 % (ref 36.0–46.0)
Hemoglobin: 13.1 g/dL (ref 12.0–15.0)
LYMPHS ABS: 3.4 10*3/uL (ref 0.7–4.0)
Lymphocytes Relative: 35 % (ref 12.0–46.0)
MCHC: 33.7 g/dL (ref 30.0–36.0)
MCV: 87.3 fl (ref 78.0–100.0)
Monocytes Absolute: 0.6 10*3/uL (ref 0.1–1.0)
Monocytes Relative: 6 % (ref 3.0–12.0)
Neutro Abs: 5.4 10*3/uL (ref 1.4–7.7)
Neutrophils Relative %: 56.5 % (ref 43.0–77.0)
Platelets: 243 10*3/uL (ref 150.0–400.0)
RBC: 4.45 Mil/uL (ref 3.87–5.11)
RDW: 15.6 % — ABNORMAL HIGH (ref 11.5–15.5)
WBC: 9.6 10*3/uL (ref 4.0–10.5)

## 2014-05-29 ENCOUNTER — Encounter: Payer: Self-pay | Admitting: Family

## 2014-05-31 ENCOUNTER — Ambulatory Visit (HOSPITAL_BASED_OUTPATIENT_CLINIC_OR_DEPARTMENT_OTHER): Payer: BLUE CROSS/BLUE SHIELD | Admitting: Hematology & Oncology

## 2014-05-31 ENCOUNTER — Other Ambulatory Visit (HOSPITAL_BASED_OUTPATIENT_CLINIC_OR_DEPARTMENT_OTHER): Payer: BLUE CROSS/BLUE SHIELD

## 2014-05-31 ENCOUNTER — Encounter: Payer: Self-pay | Admitting: Hematology & Oncology

## 2014-05-31 ENCOUNTER — Ambulatory Visit (HOSPITAL_BASED_OUTPATIENT_CLINIC_OR_DEPARTMENT_OTHER): Payer: BLUE CROSS/BLUE SHIELD

## 2014-05-31 VITALS — BP 142/93 | HR 98 | Temp 98.6°F | Resp 16 | Ht 59.0 in | Wt 220.0 lb

## 2014-05-31 DIAGNOSIS — C7B02 Secondary carcinoid tumors of liver: Secondary | ICD-10-CM

## 2014-05-31 DIAGNOSIS — C7B8 Other secondary neuroendocrine tumors: Secondary | ICD-10-CM

## 2014-05-31 DIAGNOSIS — C7A00 Malignant carcinoid tumor of unspecified site: Secondary | ICD-10-CM

## 2014-05-31 LAB — CMP (CANCER CENTER ONLY)
ALBUMIN: 3.8 g/dL (ref 3.3–5.5)
ALK PHOS: 118 U/L — AB (ref 26–84)
ALT(SGPT): 45 U/L (ref 10–47)
AST: 38 U/L (ref 11–38)
BILIRUBIN TOTAL: 0.7 mg/dL (ref 0.20–1.60)
BUN: 13 mg/dL (ref 7–22)
CHLORIDE: 104 meq/L (ref 98–108)
CO2: 27 mEq/L (ref 18–33)
Calcium: 9.3 mg/dL (ref 8.0–10.3)
Creat: 0.9 mg/dl (ref 0.6–1.2)
Glucose, Bld: 177 mg/dL — ABNORMAL HIGH (ref 73–118)
Potassium: 3.2 mEq/L — ABNORMAL LOW (ref 3.3–4.7)
Sodium: 145 mEq/L (ref 128–145)
TOTAL PROTEIN: 8.1 g/dL (ref 6.4–8.1)

## 2014-05-31 LAB — CBC WITH DIFFERENTIAL (CANCER CENTER ONLY)
BASO#: 0 10*3/uL (ref 0.0–0.2)
BASO%: 0.4 % (ref 0.0–2.0)
EOS%: 1 % (ref 0.0–7.0)
Eosinophils Absolute: 0.1 10*3/uL (ref 0.0–0.5)
HCT: 40.3 % (ref 34.8–46.6)
HGB: 13.3 g/dL (ref 11.6–15.9)
LYMPH#: 2.9 10*3/uL (ref 0.9–3.3)
LYMPH%: 28 % (ref 14.0–48.0)
MCH: 29.4 pg (ref 26.0–34.0)
MCHC: 33 g/dL (ref 32.0–36.0)
MCV: 89 fL (ref 81–101)
MONO#: 0.7 10*3/uL (ref 0.1–0.9)
MONO%: 6.2 % (ref 0.0–13.0)
NEUT#: 6.8 10*3/uL — ABNORMAL HIGH (ref 1.5–6.5)
NEUT%: 64.4 % (ref 39.6–80.0)
PLATELETS: 315 10*3/uL (ref 145–400)
RBC: 4.52 10*6/uL (ref 3.70–5.32)
RDW: 14.4 % (ref 11.1–15.7)
WBC: 10.5 10*3/uL — ABNORMAL HIGH (ref 3.9–10.0)

## 2014-05-31 MED ORDER — LANREOTIDE ACETATE 120 MG/0.5ML ~~LOC~~ SOLN
120.0000 mg | Freq: Once | SUBCUTANEOUS | Status: AC
  Administered 2014-05-31: 120 mg via SUBCUTANEOUS
  Filled 2014-05-31: qty 120

## 2014-05-31 NOTE — Progress Notes (Signed)
Hematology and Oncology Follow Up Visit  Leah [name] [id number] 1960/06/07 54 y.o. 05/31/2014   Principle Diagnosis:  Metastatic neuroendocrine carcinoma-carcinoid  Current Therapy:   Somatuline 120 mg subcutaneous q. 4 weeks    Interim History:  Ms.  [name] is back for followup. She was hospitalized last month. She had the influenza virus. Thank you, she did not have to be intubated. She was hospitalized for 4 days  She had an MRI of Leah left knee. This does show continued degeneration of Leah ligaments.  She's had no abdominal pain. She's had no problems with bowels or bladder.  Of note, while she was hospitalized, Leah children had a stomach virus. Family, Leah [name] could help out.  She's had no problems with the Somatuline.  Leah last chromogranin A level was 8 back in February.  Currently, Leah performance status is ECOG 1.    . Medications:  Current outpatient prescriptions:  .  albuterol (PROVENTIL HFA;VENTOLIN HFA) 108 (90 BASE) MCG/ACT inhaler, Inhale 2 puffs into the lungs every 6 (six) hours as needed. For shortness of breath., Disp: 3.7 g, Rfl: 0 .  amLODipine (NORVASC) 10 MG tablet, TAKE 1 TABLET DAILY, Disp: 90 tablet, Rfl: 1 .  aspirin EC 81 MG tablet, Take 1 tablet (81 mg total) by mouth daily., Disp: , Rfl:  .  [facility] ALLERGY RELIEF 180 MG tablet, TAKE 1 TABLET BY MOUTH EVERY DAY, Disp: 30 tablet, Rfl: 11 .  fluticasone (FLONASE) 50 MCG/ACT nasal spray, USE 2 SPRAYS IN BOTH NOSTRILS EVERY DAY (Patient taking differently: USE 2 SPRAYS IN BOTH NOSTRILS EVERY DAY as needed for congestion), Disp: 16 g, Rfl: 0 .  metoprolol succinate (TOPROL-XL) 25 MG 24 hr tablet, Take 1 tablet (25 mg total) by mouth daily., Disp: 30 tablet, Rfl: 0 .  montelukast (SINGULAIR) 10 MG tablet, TAKE 1 TABLET AT BEDTIME, Disp: 90 tablet, Rfl: 1 .  Multiple Vitamin (MULTI VITAMIN DAILY PO), Take by mouth every morning., Disp: , Rfl:  .  naproxen (NAPROSYN) 500 MG tablet, Take 1 tablet by mouth  2 (two) times daily., Disp: , Rfl:  .  ondansetron (ZOFRAN ODT) 4 MG disintegrating tablet, Take 1 tablet (4 mg total) by mouth every 8 (eight) hours as needed for nausea or vomiting., Disp: 20 tablet, Rfl: 0 .  pantoprazole (PROTONIX) 40 MG tablet, Take 1 tablet (40 mg total) by mouth 2 (two) times daily., Disp: 90 tablet, Rfl: 1 .  valACYclovir (VALTREX) 500 MG tablet, Take 500 mg by mouth 2 (two) times daily as needed. Cold sores, Disp: , Rfl:  .  valsartan (DIOVAN) 320 MG tablet, Take 1 tablet (320 mg total) by mouth daily., Disp: 90 tablet, Rfl: 1 .  venlafaxine XR (EFFEXOR-XR) 150 MG 24 hr capsule, TAKE 1 CAPSULE DAILY, Disp: 90 capsule, Rfl: 1  Allergies: No Known Allergies  Past Medical History, Surgical history, Social history, and Family History were reviewed and updated.  Review of Systems: As above  Physical Exam:  height is 4\' 11"  (1.499 m) and weight is 220 lb (99.791 kg). Leah oral temperature is 98.6 F (37 C). Leah blood pressure is 142/93 and Leah pulse is 98. Leah respiration is 16.   Mildly obese white female. Head and neck exam shows no ocular or oral lesions. She has no palpable cervical or supraclavicular lymph nodes. Lungs are clear bilaterally. Cardiac exam regular rate and rhythm with no murmurs rubs or bruits. Abdomen is soft. She has good bowel sounds. There is no  fluid wave. There is no palpable hepato- splenomegaly. Back exam no tenderness over the spine ribs or hips. Extremities shows no clubbing cyanosis or edema. She has some swelling with Leah knees. She has some decreased range of motion of Leah knees. There is crepitus with knee range of motion. Neurological exam shows no focal neurological deficits. Skin exam no rashes.  Lab Results  Component Value Date   WBC 10.5* 05/31/2014   HGB 13.3 05/31/2014   HCT 40.3 05/31/2014   MCV 89 05/31/2014   PLT 315 05/31/2014     Chemistry      Component Value Date/Time   NA 145 05/31/2014 1020   NA 137 05/01/2014 0959     K 3.2* 05/31/2014 1020   K 3.9 05/01/2014 0959   CL 104 05/31/2014 1020   CL 102 05/01/2014 0959   CO2 27 05/31/2014 1020   CO2 24 05/01/2014 0959   BUN 13 05/31/2014 1020   BUN 14 05/01/2014 0959   CREATININE 0.9 05/31/2014 1020   CREATININE 0.69 05/01/2014 0959      Component Value Date/Time   CALCIUM 9.3 05/31/2014 1020   CALCIUM 9.1 05/01/2014 0959   ALKPHOS 118* 05/31/2014 1020   ALKPHOS 115 04/04/2014 1024   AST 38 05/31/2014 1020   AST 41* 04/04/2014 1024   ALT 45 05/31/2014 1020   ALT 63* 04/04/2014 1024   BILITOT 0.70 05/31/2014 1020   BILITOT 0.5 04/04/2014 1024         Impression and Plan: [name] is 54 year old white female with metastatic carcinoid. She's on Somatuline. She enjoys this as this is much more convenient for Leah. . She's done well with this. Leah symptoms are very well controlled.  We will go ahead with a dose today.  We'll have Leah come back here monthly for Leah injections. We will see Leah back ourselves in about 2 or 3 months.   [name], MD 4/20/201611:24 AM

## 2014-05-31 NOTE — Patient Instructions (Signed)
Lanreotide injection What is this medicine? LANREOTIDE (lan REE oh tide) is used to reduce blood levels of growth hormone in patients with a condition called acromegaly. It also works to slow or stop tumor growth in patients with gastroenteropancreatic neuroendocrine tumor (GEP-NET). This medicine may be used for other purposes; ask your health care provider or pharmacist if you have questions. COMMON BRAND NAME(S): Somatuline Depot What should I tell my health care provider before I take this medicine? They need to know if you have any of these conditions: -diabetes -gallbladder disease -heart disease -kidney disease -liver disease -an unusual or allergic reaction to lanreotide, other medicines, latex, foods, dyes, or preservatives -pregnant or trying to get pregnant -breast-feeding How should I use this medicine? This medicine is for injection under the skin. It is given by a health care professional in a hospital or clinic setting. Contact your pediatrician or health care professional regarding the use of this medicine in children. Special care may be needed. Overdosage: If you think you have taken too much of this medicine contact a poison control center or emergency room at once. NOTE: This medicine is only for you. Do not share this medicine with others. What if I miss a dose? It is important not to miss your dose. Call your doctor or health care professional if you are unable to keep an appointment. What may interact with this medicine? -bromocriptine -cyclosporine -medicines for diabetes, including insulin -medicines for heart disease or hypertension -quinidine This list may not describe all possible interactions. Give your health care provider a list of all the medicines, herbs, non-prescription drugs, or dietary supplements you use. Also tell them if you smoke, drink alcohol, or use illegal drugs. Some items may interact with your medicine. What should I watch for while using  this medicine? Visit your doctor or health care professional for regular checks on your progress. Your condition will be monitored carefully while you are receiving this medicine. This medicine may cause increases or decreases in blood sugar. Signs of high blood sugar include frequent urination, unusual thirst, flushed or dry skin, difficulty breathing, drowsiness, stomach ache, nausea, vomiting or dry mouth. Signs of low blood sugar include chills, cool, pale skin or cold sweats, drowsiness, extreme hunger, fast heartbeat, headache, nausea, nervousness or anxiety, shakiness, trembling, unsteadiness, tiredness, or weakness. Contact your doctor or health care professional right away if you experience any of these symptoms. What side effects may I notice from receiving this medicine? Side effects that you should report to your doctor or health care professional as soon as possible: -allergic reactions like skin rash, itching or hives, swelling of the face, lips, or tongue -changes in blood sugar -changes in heart rate -severe stomach pain Side effects that usually do not require medical attention (report to your doctor or health care professional if they continue or are bothersome): -diarrhea or constipation -gas or stomach pain -nausea, vomiting -pain, redness, swelling and irritation at site where injected This list may not describe all possible side effects. Call your doctor for medical advice about side effects. You may report side effects to FDA at 1-800-FDA-1088. Where should I keep my medicine? This drug is given in a hospital or clinic and will not be stored at home. NOTE: This sheet is a summary. It may not cover all possible information. If you have questions about this medicine, talk to your doctor, pharmacist, or health care provider.  2015, Elsevier/Gold Standard. (2013-01-26 17:43:04)

## 2014-06-06 ENCOUNTER — Encounter: Payer: Self-pay | Admitting: Family

## 2014-06-06 ENCOUNTER — Ambulatory Visit (INDEPENDENT_AMBULATORY_CARE_PROVIDER_SITE_OTHER): Payer: BLUE CROSS/BLUE SHIELD | Admitting: Family

## 2014-06-06 VITALS — BP 142/89 | HR 86 | Temp 98.4°F | Resp 16 | Ht 59.0 in | Wt 222.2 lb

## 2014-06-06 DIAGNOSIS — I10 Essential (primary) hypertension: Secondary | ICD-10-CM

## 2014-06-06 LAB — CHROMOGRANIN A: CHROMOGRANIN A: 7 ng/mL (ref <=15)

## 2014-06-06 MED ORDER — METOPROLOL SUCCINATE ER 50 MG PO TB24: 50.0000 mg | ORAL_TABLET | Freq: Every day | ORAL | Status: DC

## 2014-06-06 MED ORDER — METOPROLOL SUCCINATE ER 25 MG PO TB24: 25.0000 mg | ORAL_TABLET | Freq: Every day | ORAL | Status: DC

## 2014-06-06 NOTE — Progress Notes (Signed)
Subjective:    Patient ID: Leah [name], female    DOB: 1960/09/05, 54 y.o.   MRN: [id number]  HPI  Leah [name] is 54 yr old female who presents today for follow up of hypertension.  Patient is currently maintained on the following medications for blood pressure: amlodipine, toprol xl, diovan.  (toprol xl was added last visit to her regimen) Patient reports good compliance with blood pressure medications. Patient denies chest pain, shortness of breath or swelling. Last 3 blood pressure readings in our office are as follows:  BP Readings from Last 3 Encounters:  06/06/14 142/89  05/31/14 142/93  05/04/14 121/98        Review of Systems See HPI  Past Medical History  Diagnosis Date  . Sarcoidosis 2008  . Allergic rhinitis   . GE reflux   . Neuroendocrine tumor 2011  . Surgical menopause   . Migraines   . Atrophic vaginitis   . STD (sexually transmitted disease)     HSV I  . Hepatic encephalopathy 2011  . HTN (hypertension) 2011  . Carcinoid syndrome   . Borderline diabetes 03/17/2013  . Orthostatic hypotension   . Palpitations     History   Social History  . Marital Status: Married    Spouse Name: N/A  . Number of Children: N/A  . Years of Education: N/A   Occupational History  . Not on file.   Social History Main Topics  . Smoking status: Never Smoker   . Smokeless tobacco: Never Used     Comment: never used tobacco  . Alcohol Use: No  . Drug Use: No  . Sexual Activity: Yes    Birth Control/ Protection: Surgical     Comment: hysterectomy   Other Topics Concern  . Not on file   Social History Narrative   Married to [place]   2 sons- 2001 [name] (1999)   Has dog   She is unemployed, has worked in the past (office work)   Completed some college   Enjoys Theatre manager    Past Surgical History  Procedure Laterality Date  . Tonsillectomy  1976  . Cesarean section  [id number]  . Cholecystectomy  2006  . Abdominal hysterectomy   2004  . Oophorectomy  2004  . Mastectomy  2008    bilateral  . Bilateral mastectectomy Bilateral 2008  . Latissimus dorsi graft  2008    used for reconstructive breast surgery  . Hepatetetomy    . Tubal ligation  2001    BTL  . Breast surgery      bilateral masectomy and reconstruction with silicone implants  . Knee arthroscopy Left 09/2010    Family History  Problem Relation Age of Onset  . Arthritis Maternal Grandmother   . Arthritis Maternal Grandfather   . Arthritis Paternal Grandmother   . Arthritis Paternal Grandfather   . Heart failure Paternal Grandfather   . Breast cancer Mother 26    died at 50  . Lupus Mother   . Heart failure Father   . Hypertension Father   . Hyperlipidemia Father   . Breast cancer Maternal Aunt 38    deceased at 53 recurrence  . Ovarian cancer Maternal Aunt 45     died with in 21 months  . Breast cancer Maternal Aunt 40    died at age 68    No Known Allergies  Current Outpatient Prescriptions on File Prior to Visit  Medication Sig Dispense Refill  .  albuterol (PROVENTIL HFA;VENTOLIN HFA) 108 (90 BASE) MCG/ACT inhaler Inhale 2 puffs into the lungs every 6 (six) hours as needed. For shortness of breath. 3.7 g 0  . amLODipine (NORVASC) 10 MG tablet TAKE 1 TABLET DAILY 90 tablet 1  . aspirin EC 81 MG tablet Take 1 tablet (81 mg total) by mouth daily.    . [facility] ALLERGY RELIEF 180 MG tablet TAKE 1 TABLET BY MOUTH EVERY DAY 30 tablet 11  . fluticasone (FLONASE) 50 MCG/ACT nasal spray USE 2 SPRAYS IN BOTH NOSTRILS EVERY DAY (Patient taking differently: USE 2 SPRAYS IN BOTH NOSTRILS EVERY DAY as needed for congestion) 16 g 0  . montelukast (SINGULAIR) 10 MG tablet TAKE 1 TABLET AT BEDTIME 90 tablet 1  . Multiple Vitamin (MULTI VITAMIN DAILY PO) Take by mouth every morning.    . naproxen (NAPROSYN) 500 MG tablet Take 1 tablet by mouth 2 (two) times daily.    . ondansetron (ZOFRAN ODT) 4 MG disintegrating tablet Take 1 tablet (4 mg total) by mouth  every 8 (eight) hours as needed for nausea or vomiting. 20 tablet 0  . pantoprazole (PROTONIX) 40 MG tablet Take 1 tablet (40 mg total) by mouth 2 (two) times daily. 90 tablet 1  . valACYclovir (VALTREX) 500 MG tablet Take 500 mg by mouth 2 (two) times daily as needed. Cold sores    . valsartan (DIOVAN) 320 MG tablet Take 1 tablet (320 mg total) by mouth daily. 90 tablet 1  . venlafaxine XR (EFFEXOR-XR) 150 MG 24 hr capsule TAKE 1 CAPSULE DAILY 90 capsule 1   No current facility-administered medications on file prior to visit.    BP 142/89 mmHg  Pulse 86  Temp(Src) 98.4 F (36.9 C) (Oral)  Resp 16  Ht 4\' 11"  (1.499 m)  Wt 222 lb 3.2 oz (100.789 kg)  BMI 44.85 kg/m2  SpO2 96%       Objective:   Physical Exam  Constitutional: She appears well-developed and well-nourished.  Cardiovascular: Normal rate, regular rhythm and normal heart sounds.   No murmur heard. Pulmonary/Chest: Effort normal and breath sounds normal. No respiratory distress. She has no wheezes.  Psychiatric: She has a normal mood and affect. Her behavior is normal. Judgment and thought content normal.          Assessment & Plan:

## 2014-06-06 NOTE — Patient Instructions (Signed)
Increase Toprol xl from 25mg  to 50mg  once daily. Follow up in 1 month.

## 2014-06-06 NOTE — Progress Notes (Signed)
Pre visit review using our clinic review tool, if applicable. No additional management support is needed unless otherwise documented below in the visit note. 

## 2014-06-06 NOTE — Assessment & Plan Note (Signed)
Improving, still slightly above goal. Increase toprol xl from 25mg  to 50mg . Follow up in 1 month.

## 2014-06-07 ENCOUNTER — Encounter: Payer: Self-pay | Admitting: Family

## 2014-06-11 ENCOUNTER — Other Ambulatory Visit: Payer: Self-pay | Admitting: Family

## 2014-06-12 ENCOUNTER — Other Ambulatory Visit: Payer: Self-pay | Admitting: Hematology & Oncology

## 2014-06-26 ENCOUNTER — Other Ambulatory Visit: Payer: Self-pay | Admitting: Family

## 2014-06-27 ENCOUNTER — Ambulatory Visit (HOSPITAL_BASED_OUTPATIENT_CLINIC_OR_DEPARTMENT_OTHER): Payer: BLUE CROSS/BLUE SHIELD

## 2014-06-27 VITALS — BP 152/89 | HR 98 | Temp 98.3°F | Resp 18

## 2014-06-27 DIAGNOSIS — C7B02 Secondary carcinoid tumors of liver: Secondary | ICD-10-CM | POA: Diagnosis not present

## 2014-06-27 DIAGNOSIS — C7B8 Other secondary neuroendocrine tumors: Secondary | ICD-10-CM | POA: Diagnosis not present

## 2014-06-27 DIAGNOSIS — C7A00 Malignant carcinoid tumor of unspecified site: Secondary | ICD-10-CM | POA: Diagnosis not present

## 2014-06-27 MED ORDER — LANREOTIDE ACETATE 120 MG/0.5ML ~~LOC~~ SOLN
120.0000 mg | Freq: Once | SUBCUTANEOUS | Status: AC
  Administered 2014-06-27: 120 mg via SUBCUTANEOUS
  Filled 2014-06-27: qty 120

## 2014-06-27 NOTE — Patient Instructions (Signed)
Lanreotide injection What is this medicine? LANREOTIDE (lan REE oh tide) is used to reduce blood levels of growth hormone in patients with a condition called acromegaly. It also works to slow or stop tumor growth in patients with gastroenteropancreatic neuroendocrine tumor (GEP-NET). This medicine may be used for other purposes; ask your health care provider or pharmacist if you have questions. COMMON BRAND NAME(S): Somatuline Depot What should I tell my health care provider before I take this medicine? They need to know if you have any of these conditions: -diabetes -gallbladder disease -heart disease -kidney disease -liver disease -an unusual or allergic reaction to lanreotide, other medicines, latex, foods, dyes, or preservatives -pregnant or trying to get pregnant -breast-feeding How should I use this medicine? This medicine is for injection under the skin. It is given by a health care professional in a hospital or clinic setting. Contact your pediatrician or health care professional regarding the use of this medicine in children. Special care may be needed. Overdosage: If you think you have taken too much of this medicine contact a poison control center or emergency room at once. NOTE: This medicine is only for you. Do not share this medicine with others. What if I miss a dose? It is important not to miss your dose. Call your doctor or health care professional if you are unable to keep an appointment. What may interact with this medicine? -bromocriptine -cyclosporine -medicines for diabetes, including insulin -medicines for heart disease or hypertension -quinidine This list may not describe all possible interactions. Give your health care provider a list of all the medicines, herbs, non-prescription drugs, or dietary supplements you use. Also tell them if you smoke, drink alcohol, or use illegal drugs. Some items may interact with your medicine. What should I watch for while using  this medicine? Visit your doctor or health care professional for regular checks on your progress. Your condition will be monitored carefully while you are receiving this medicine. This medicine may cause increases or decreases in blood sugar. Signs of high blood sugar include frequent urination, unusual thirst, flushed or dry skin, difficulty breathing, drowsiness, stomach ache, nausea, vomiting or dry mouth. Signs of low blood sugar include chills, cool, pale skin or cold sweats, drowsiness, extreme hunger, fast heartbeat, headache, nausea, nervousness or anxiety, shakiness, trembling, unsteadiness, tiredness, or weakness. Contact your doctor or health care professional right away if you experience any of these symptoms. What side effects may I notice from receiving this medicine? Side effects that you should report to your doctor or health care professional as soon as possible: -allergic reactions like skin rash, itching or hives, swelling of the face, lips, or tongue -changes in blood sugar -changes in heart rate -severe stomach pain Side effects that usually do not require medical attention (report to your doctor or health care professional if they continue or are bothersome): -diarrhea or constipation -gas or stomach pain -nausea, vomiting -pain, redness, swelling and irritation at site where injected This list may not describe all possible side effects. Call your doctor for medical advice about side effects. You may report side effects to FDA at 1-800-FDA-1088. Where should I keep my medicine? This drug is given in a hospital or clinic and will not be stored at home. NOTE: This sheet is a summary. It may not cover all possible information. If you have questions about this medicine, talk to your doctor, pharmacist, or health care provider.  2015, Elsevier/Gold Standard. (2013-01-26 17:43:04)

## 2014-07-11 ENCOUNTER — Ambulatory Visit: Payer: BLUE CROSS/BLUE SHIELD | Admitting: Family

## 2014-07-11 ENCOUNTER — Encounter: Payer: Self-pay | Admitting: Family

## 2014-07-11 DIAGNOSIS — Z0289 Encounter for other administrative examinations: Secondary | ICD-10-CM

## 2014-07-12 ENCOUNTER — Ambulatory Visit: Payer: BLUE CROSS/BLUE SHIELD | Admitting: Family

## 2014-07-17 ENCOUNTER — Other Ambulatory Visit: Payer: Self-pay | Admitting: Family

## 2014-07-18 ENCOUNTER — Telehealth: Payer: Self-pay | Admitting: Family

## 2014-07-18 ENCOUNTER — Encounter: Payer: Self-pay | Admitting: Family

## 2014-07-18 NOTE — Telephone Encounter (Signed)
No

## 2014-07-18 NOTE — Telephone Encounter (Signed)
Pt was no show for appointment on 07/11/14. Appointment has been rescheduled for 07/19/14. Charge?

## 2014-07-19 ENCOUNTER — Encounter: Payer: Self-pay | Admitting: Family

## 2014-07-19 ENCOUNTER — Ambulatory Visit (INDEPENDENT_AMBULATORY_CARE_PROVIDER_SITE_OTHER): Payer: BLUE CROSS/BLUE SHIELD | Admitting: Family

## 2014-07-19 VITALS — BP 154/100 | HR 88 | Temp 98.2°F | Resp 16 | Ht 59.0 in | Wt 227.2 lb

## 2014-07-19 DIAGNOSIS — I10 Essential (primary) hypertension: Secondary | ICD-10-CM

## 2014-07-19 MED ORDER — METOPROLOL SUCCINATE ER 100 MG PO TB24: 100.0000 mg | ORAL_TABLET | Freq: Every day | ORAL | Status: DC

## 2014-07-19 NOTE — Assessment & Plan Note (Signed)
Uncontrolled on current meds.  Increase toprol xl from 25mg  to 50mg  once daily. Follow up in 1 month.

## 2014-07-19 NOTE — Progress Notes (Signed)
Subjective:    Patient ID: Leah [name], female    DOB: 1960/08/10, 54 y.o.   MRN: [id number]  HPI  Leah [name] is a 54 yr old female who presents today for follow up of her hypertension.  Patient is currently maintained on the following medications for blood pressure: amlodipine 10mg  , diovan 320mg , toprol xl (increased last visit from 25mg  to 50mg ). Patient reports good compliance with blood pressure medications. Patient denies chest pain, or swelling. She reports occasional SOB which she attributes to somatuline.   Last 3 blood pressure readings in our office are as follows: BP Readings from Last 3 Encounters:  07/19/14 154/100  06/27/14 152/89  06/06/14 142/89   Reports home bp readings 145/85 generally at home at rest.   Review of Systems See HPI  Past Medical History  Diagnosis Date  . Sarcoidosis 2008  . Allergic rhinitis   . GE reflux   . Neuroendocrine tumor 2011  . Surgical menopause   . Migraines   . Atrophic vaginitis   . STD (sexually transmitted disease)     HSV I  . Hepatic encephalopathy 2011  . HTN (hypertension) 2011  . Carcinoid syndrome   . Borderline diabetes 03/17/2013  . Orthostatic hypotension   . Palpitations   . History of retinal vein occlusion     right eye    History   Social History  . Marital Status: Married    Spouse Name: N/A  . Number of Children: N/A  . Years of Education: N/A   Occupational History  . Not on file.   Social History Main Topics  . Smoking status: Never Smoker   . Smokeless tobacco: Never Used     Comment: never used tobacco  . Alcohol Use: No  . Drug Use: No  . Sexual Activity: Yes    Birth Control/ Protection: Surgical     Comment: hysterectomy   Other Topics Concern  . Not on file   Social History Narrative   Married to [place]   2 sons- 2001 [name] (1999)   Has dog   She is unemployed, has worked in the past (office work)   Completed some college   Enjoys Theatre manager     Past Surgical History  Procedure Laterality Date  . Tonsillectomy  1976  . Cesarean section  [id number]  . Cholecystectomy  2006  . Abdominal hysterectomy  2004  . Oophorectomy  2004  . Mastectomy  2008    bilateral  . Bilateral mastectectomy Bilateral 2008  . Latissimus dorsi graft  2008    used for reconstructive breast surgery  . Hepatetetomy    . Tubal ligation  2001    BTL  . Breast surgery      bilateral masectomy and reconstruction with silicone implants  . Knee arthroscopy Left 09/2010    Family History  Problem Relation Age of Onset  . Arthritis Maternal Grandmother   . Arthritis Maternal Grandfather   . Arthritis Paternal Grandmother   . Arthritis Paternal Grandfather   . Heart failure Paternal Grandfather   . Breast cancer Mother 88    died at 59  . Lupus Mother   . Heart failure Father   . Hypertension Father   . Hyperlipidemia Father   . Breast cancer Maternal Aunt 38    deceased at 84 recurrence  . Ovarian cancer Maternal Aunt 34     died with in 27 months  . Breast cancer Maternal Aunt  42    died at age 97    No Known Allergies  Current Outpatient Prescriptions on File Prior to Visit  Medication Sig Dispense Refill  . amLODipine (NORVASC) 10 MG tablet TAKE 1 TABLET DAILY 90 tablet 1  . aspirin EC 81 MG tablet Take 1 tablet (81 mg total) by mouth daily.    . [facility] ALLERGY RELIEF 180 MG tablet TAKE 1 TABLET BY MOUTH EVERY DAY 30 tablet 11  . fluticasone (FLONASE) 50 MCG/ACT nasal spray USE 2 SPRAYS IN BOTH NOSTRILS EVERY DAY 16 g 2  . metoprolol succinate (TOPROL-XL) 50 MG 24 hr tablet Take 1 tablet (50 mg total) by mouth daily. Take with or immediately following a meal. 30 tablet 5  . montelukast (SINGULAIR) 10 MG tablet TAKE 1 TABLET AT BEDTIME 90 tablet 1  . Multiple Vitamin (MULTI VITAMIN DAILY PO) Take by mouth every morning.    . naproxen (NAPROSYN) 500 MG tablet Take 1 tablet by mouth 2 (two) times daily.    . ondansetron (ZOFRAN ODT) 4 MG  disintegrating tablet Take 1 tablet (4 mg total) by mouth every 8 (eight) hours as needed for nausea or vomiting. 20 tablet 0  . pantoprazole (PROTONIX) 40 MG tablet TAKE 1 TABLET BY MOUTH TWICE A DAY 60 tablet 5  . PROVENTIL HFA 108 (90 BASE) MCG/ACT inhaler INHALE 2 PUFFS INTO THE LUNGS EVERY 6 (SIX) HOURS AS NEEDED. FOR SHORTNESS OF BREATH. 6.7 Inhaler 2  . valACYclovir (VALTREX) 500 MG tablet Take 500 mg by mouth 2 (two) times daily as needed. Cold sores    . valsartan (DIOVAN) 320 MG tablet Take 1 tablet (320 mg total) by mouth daily. 90 tablet 1  . venlafaxine XR (EFFEXOR-XR) 150 MG 24 hr capsule TAKE 1 CAPSULE DAILY 90 capsule 1   No current facility-administered medications on file prior to visit.    BP 154/100 mmHg  Pulse 88  Temp(Src) 98.2 F (36.8 C) (Oral)  Resp 16  Ht 4\' 11"  (1.499 m)  Wt 227 lb 3.2 oz (103.057 kg)  BMI 45.86 kg/m2  SpO2 99%       Objective:   Physical Exam  Constitutional: She appears well-developed and well-nourished.  Cardiovascular: Normal rate, regular rhythm and normal heart sounds.   No murmur heard. Pulmonary/Chest: Effort normal and breath sounds normal. No respiratory distress. She has no wheezes.  Musculoskeletal: She exhibits no edema.  Psychiatric: She has a normal mood and affect. Her behavior is normal. Judgment and thought content normal.          Assessment & Plan:

## 2014-07-19 NOTE — Patient Instructions (Addendum)
Please increase toprol xl from 25 mg to 50mg  once daily.

## 2014-07-25 ENCOUNTER — Ambulatory Visit: Payer: BLUE CROSS/BLUE SHIELD

## 2014-07-26 ENCOUNTER — Ambulatory Visit (HOSPITAL_BASED_OUTPATIENT_CLINIC_OR_DEPARTMENT_OTHER): Payer: BLUE CROSS/BLUE SHIELD

## 2014-07-26 VITALS — BP 166/100 | HR 105 | Temp 98.7°F | Resp 18

## 2014-07-26 DIAGNOSIS — C7A00 Malignant carcinoid tumor of unspecified site: Secondary | ICD-10-CM | POA: Diagnosis not present

## 2014-07-26 DIAGNOSIS — C7B02 Secondary carcinoid tumors of liver: Secondary | ICD-10-CM | POA: Diagnosis not present

## 2014-07-26 DIAGNOSIS — C7B8 Other secondary neuroendocrine tumors: Secondary | ICD-10-CM | POA: Diagnosis not present

## 2014-07-26 MED ORDER — LANREOTIDE ACETATE 120 MG/0.5ML ~~LOC~~ SOLN
120.0000 mg | Freq: Once | SUBCUTANEOUS | Status: AC
  Administered 2014-07-26: 120 mg via SUBCUTANEOUS
  Filled 2014-07-26: qty 120

## 2014-07-26 NOTE — Patient Instructions (Signed)
Lanreotide injection What is this medicine? LANREOTIDE (lan REE oh tide) is used to reduce blood levels of growth hormone in patients with a condition called acromegaly. It also works to slow or stop tumor growth in patients with gastroenteropancreatic neuroendocrine tumor (GEP-NET). This medicine may be used for other purposes; ask your health care provider or pharmacist if you have questions. COMMON BRAND NAME(S): Somatuline Depot What should I tell my health care provider before I take this medicine? They need to know if you have any of these conditions: -diabetes -gallbladder disease -heart disease -kidney disease -liver disease -an unusual or allergic reaction to lanreotide, other medicines, latex, foods, dyes, or preservatives -pregnant or trying to get pregnant -breast-feeding How should I use this medicine? This medicine is for injection under the skin. It is given by a health care professional in a hospital or clinic setting. Contact your pediatrician or health care professional regarding the use of this medicine in children. Special care may be needed. Overdosage: If you think you have taken too much of this medicine contact a poison control center or emergency room at once. NOTE: This medicine is only for you. Do not share this medicine with others. What if I miss a dose? It is important not to miss your dose. Call your doctor or health care professional if you are unable to keep an appointment. What may interact with this medicine? -bromocriptine -cyclosporine -medicines for diabetes, including insulin -medicines for heart disease or hypertension -quinidine This list may not describe all possible interactions. Give your health care provider a list of all the medicines, herbs, non-prescription drugs, or dietary supplements you use. Also tell them if you smoke, drink alcohol, or use illegal drugs. Some items may interact with your medicine. What should I watch for while using  this medicine? Visit your doctor or health care professional for regular checks on your progress. Your condition will be monitored carefully while you are receiving this medicine. This medicine may cause increases or decreases in blood sugar. Signs of high blood sugar include frequent urination, unusual thirst, flushed or dry skin, difficulty breathing, drowsiness, stomach ache, nausea, vomiting or dry mouth. Signs of low blood sugar include chills, cool, pale skin or cold sweats, drowsiness, extreme hunger, fast heartbeat, headache, nausea, nervousness or anxiety, shakiness, trembling, unsteadiness, tiredness, or weakness. Contact your doctor or health care professional right away if you experience any of these symptoms. What side effects may I notice from receiving this medicine? Side effects that you should report to your doctor or health care professional as soon as possible: -allergic reactions like skin rash, itching or hives, swelling of the face, lips, or tongue -changes in blood sugar -changes in heart rate -severe stomach pain Side effects that usually do not require medical attention (report to your doctor or health care professional if they continue or are bothersome): -diarrhea or constipation -gas or stomach pain -nausea, vomiting -pain, redness, swelling and irritation at site where injected This list may not describe all possible side effects. Call your doctor for medical advice about side effects. You may report side effects to FDA at 1-800-FDA-1088. Where should I keep my medicine? This drug is given in a hospital or clinic and will not be stored at home. NOTE: This sheet is a summary. It may not cover all possible information. If you have questions about this medicine, talk to your doctor, pharmacist, or health care provider.  2015, Elsevier/Gold Standard. (2013-01-26 17:43:04)

## 2014-07-27 ENCOUNTER — Other Ambulatory Visit: Payer: Self-pay | Admitting: Family

## 2014-07-27 NOTE — Telephone Encounter (Signed)
Rx denied-filled on (06-27-14).//AB/CMA

## 2014-08-22 ENCOUNTER — Ambulatory Visit (HOSPITAL_BASED_OUTPATIENT_CLINIC_OR_DEPARTMENT_OTHER): Payer: BLUE CROSS/BLUE SHIELD

## 2014-08-22 ENCOUNTER — Encounter: Payer: Self-pay | Admitting: Hematology & Oncology

## 2014-08-22 ENCOUNTER — Other Ambulatory Visit (HOSPITAL_BASED_OUTPATIENT_CLINIC_OR_DEPARTMENT_OTHER): Payer: BLUE CROSS/BLUE SHIELD

## 2014-08-22 ENCOUNTER — Ambulatory Visit (HOSPITAL_BASED_OUTPATIENT_CLINIC_OR_DEPARTMENT_OTHER): Payer: BLUE CROSS/BLUE SHIELD | Admitting: Hematology & Oncology

## 2014-08-22 VITALS — BP 150/99 | HR 91 | Temp 97.8°F | Wt 224.0 lb

## 2014-08-22 DIAGNOSIS — C7B02 Secondary carcinoid tumors of liver: Secondary | ICD-10-CM

## 2014-08-22 DIAGNOSIS — C7B8 Other secondary neuroendocrine tumors: Secondary | ICD-10-CM

## 2014-08-22 DIAGNOSIS — C7A00 Malignant carcinoid tumor of unspecified site: Secondary | ICD-10-CM

## 2014-08-22 DIAGNOSIS — C7A8 Other malignant neuroendocrine tumors: Secondary | ICD-10-CM

## 2014-08-22 LAB — CBC WITH DIFFERENTIAL (CANCER CENTER ONLY)
BASO#: 0 10*3/uL (ref 0.0–0.2)
BASO%: 0.3 % (ref 0.0–2.0)
EOS ABS: 0.2 10*3/uL (ref 0.0–0.5)
EOS%: 2.2 % (ref 0.0–7.0)
HCT: 39.2 % (ref 34.8–46.6)
HGB: 13 g/dL (ref 11.6–15.9)
LYMPH#: 2.4 10*3/uL (ref 0.9–3.3)
LYMPH%: 26.2 % (ref 14.0–48.0)
MCH: 29.3 pg (ref 26.0–34.0)
MCHC: 33.2 g/dL (ref 32.0–36.0)
MCV: 89 fL (ref 81–101)
MONO#: 0.6 10*3/uL (ref 0.1–0.9)
MONO%: 6.9 % (ref 0.0–13.0)
NEUT#: 5.9 10*3/uL (ref 1.5–6.5)
NEUT%: 64.4 % (ref 39.6–80.0)
PLATELETS: 259 10*3/uL (ref 145–400)
RBC: 4.43 10*6/uL (ref 3.70–5.32)
RDW: 13.4 % (ref 11.1–15.7)
WBC: 9.2 10*3/uL (ref 3.9–10.0)

## 2014-08-22 LAB — CMP (CANCER CENTER ONLY)
ALT(SGPT): 31 U/L (ref 10–47)
AST: 31 U/L (ref 11–38)
Albumin: 3.5 g/dL (ref 3.3–5.5)
Alkaline Phosphatase: 110 U/L — ABNORMAL HIGH (ref 26–84)
BUN, Bld: 13 mg/dL (ref 7–22)
CALCIUM: 8.9 mg/dL (ref 8.0–10.3)
CO2: 27 meq/L (ref 18–33)
CREATININE: 0.9 mg/dL (ref 0.6–1.2)
Chloride: 105 mEq/L (ref 98–108)
GLUCOSE: 127 mg/dL — AB (ref 73–118)
Potassium: 3.8 mEq/L (ref 3.3–4.7)
Sodium: 139 mEq/L (ref 128–145)
TOTAL PROTEIN: 7.1 g/dL (ref 6.4–8.1)
Total Bilirubin: 0.8 mg/dl (ref 0.20–1.60)

## 2014-08-22 MED ORDER — LANREOTIDE ACETATE 120 MG/0.5ML ~~LOC~~ SOLN
120.0000 mg | Freq: Once | SUBCUTANEOUS | Status: AC
  Administered 2014-08-22: 120 mg via SUBCUTANEOUS
  Filled 2014-08-22: qty 120

## 2014-08-22 NOTE — Patient Instructions (Signed)
Lanreotide injection What is this medicine? LANREOTIDE (lan REE oh tide) is used to reduce blood levels of growth hormone in patients with a condition called acromegaly. It also works to slow or stop tumor growth in patients with gastroenteropancreatic neuroendocrine tumor (GEP-NET). This medicine may be used for other purposes; ask your health care provider or pharmacist if you have questions. COMMON BRAND NAME(S): Somatuline Depot What should I tell my health care provider before I take this medicine? They need to know if you have any of these conditions: -diabetes -gallbladder disease -heart disease -kidney disease -liver disease -an unusual or allergic reaction to lanreotide, other medicines, latex, foods, dyes, or preservatives -pregnant or trying to get pregnant -breast-feeding How should I use this medicine? This medicine is for injection under the skin. It is given by a health care professional in a hospital or clinic setting. Contact your pediatrician or health care professional regarding the use of this medicine in children. Special care may be needed. Overdosage: If you think you have taken too much of this medicine contact a poison control center or emergency room at once. NOTE: This medicine is only for you. Do not share this medicine with others. What if I miss a dose? It is important not to miss your dose. Call your doctor or health care professional if you are unable to keep an appointment. What may interact with this medicine? -bromocriptine -cyclosporine -medicines for diabetes, including insulin -medicines for heart disease or hypertension -quinidine This list may not describe all possible interactions. Give your health care provider a list of all the medicines, herbs, non-prescription drugs, or dietary supplements you use. Also tell them if you smoke, drink alcohol, or use illegal drugs. Some items may interact with your medicine. What should I watch for while using  this medicine? Visit your doctor or health care professional for regular checks on your progress. Your condition will be monitored carefully while you are receiving this medicine. This medicine may cause increases or decreases in blood sugar. Signs of high blood sugar include frequent urination, unusual thirst, flushed or dry skin, difficulty breathing, drowsiness, stomach ache, nausea, vomiting or dry mouth. Signs of low blood sugar include chills, cool, pale skin or cold sweats, drowsiness, extreme hunger, fast heartbeat, headache, nausea, nervousness or anxiety, shakiness, trembling, unsteadiness, tiredness, or weakness. Contact your doctor or health care professional right away if you experience any of these symptoms. What side effects may I notice from receiving this medicine? Side effects that you should report to your doctor or health care professional as soon as possible: -allergic reactions like skin rash, itching or hives, swelling of the face, lips, or tongue -changes in blood sugar -changes in heart rate -severe stomach pain Side effects that usually do not require medical attention (report to your doctor or health care professional if they continue or are bothersome): -diarrhea or constipation -gas or stomach pain -nausea, vomiting -pain, redness, swelling and irritation at site where injected This list may not describe all possible side effects. Call your doctor for medical advice about side effects. You may report side effects to FDA at 1-800-FDA-1088. Where should I keep my medicine? This drug is given in a hospital or clinic and will not be stored at home. NOTE: This sheet is a summary. It may not cover all possible information. If you have questions about this medicine, talk to your doctor, pharmacist, or health care provider.  2015, Elsevier/Gold Standard. (2013-01-26 17:43:04)

## 2014-08-22 NOTE — Progress Notes (Signed)
Hematology and Oncology Follow Up Visit  Leah [name] [id number] May 14, 1960 54 y.o. 08/22/2014   Principle Diagnosis:  Metastatic neuroendocrine carcinoma-carcinoid  Current Therapy:   Somatuline 120 mg subcutaneous q. 4 weeks    Interim History:  Ms.  Hocutt is back for followup. She is looking quite good. She's had no nausea or vomiting. She's gotten over the influenza infection that she had a few months ago.  Her last chromogranin a level Her last chromogranin A level was 7 back in April.  She's had no issues with cough or shortness of breath. There's been no diarrhea. She's had no wheezing.  She and her family will be heading up to [place] in August for vacation. She is looking forward to this.  She's had no rashes. She's had no headache. She's had no visual issues.  Currently, her performance status is ECOG 1.    . Medications:  Current outpatient prescriptions:  .  amLODipine (NORVASC) 10 MG tablet, TAKE 1 TABLET DAILY, Disp: 90 tablet, Rfl: 1 .  aspirin EC 81 MG tablet, Take 1 tablet (81 mg total) by mouth daily., Disp: , Rfl:  .  [facility] ALLERGY RELIEF 180 MG tablet, TAKE 1 TABLET BY MOUTH EVERY DAY, Disp: 30 tablet, Rfl: 11 .  fluticasone (FLONASE) 50 MCG/ACT nasal spray, USE 2 SPRAYS IN BOTH NOSTRILS EVERY DAY, Disp: 16 g, Rfl: 2 .  metoprolol succinate (TOPROL-XL) 100 MG 24 hr tablet, Take 1 tablet (100 mg total) by mouth daily. Take with or immediately following a meal., Disp: 30 tablet, Rfl: 3 .  montelukast (SINGULAIR) 10 MG tablet, TAKE 1 TABLET AT BEDTIME, Disp: 90 tablet, Rfl: 1 .  Multiple Vitamin (MULTI VITAMIN DAILY PO), Take by mouth every morning., Disp: , Rfl:  .  naproxen (NAPROSYN) 500 MG tablet, Take 1 tablet by mouth 2 (two) times daily., Disp: , Rfl:  .  ondansetron (ZOFRAN ODT) 4 MG disintegrating tablet, Take 1 tablet (4 mg total) by mouth every 8 (eight) hours as needed for nausea or vomiting., Disp: 20 tablet, Rfl: 0 .  pantoprazole (PROTONIX) 40  MG tablet, TAKE 1 TABLET BY MOUTH TWICE A DAY, Disp: 60 tablet, Rfl: 5 .  PROVENTIL HFA 108 (90 BASE) MCG/ACT inhaler, INHALE 2 PUFFS INTO THE LUNGS EVERY 6 (SIX) HOURS AS NEEDED. FOR SHORTNESS OF BREATH., Disp: 6.7 Inhaler, Rfl: 2 .  valACYclovir (VALTREX) 500 MG tablet, Take 500 mg by mouth 2 (two) times daily as needed. Cold sores, Disp: , Rfl:  .  valsartan (DIOVAN) 320 MG tablet, Take 1 tablet (320 mg total) by mouth daily., Disp: 90 tablet, Rfl: 1 .  venlafaxine XR (EFFEXOR-XR) 150 MG 24 hr capsule, TAKE 1 CAPSULE DAILY, Disp: 90 capsule, Rfl: 1 No current facility-administered medications for this visit.  Facility-Administered Medications Ordered in Other Visits:  .  lanreotide acetate (SOMATULINE DEPOT) injection 120 mg, 120 mg, Subcutaneous, Once, [name], MD  Allergies: No Known Allergies  Past Medical History, Surgical history, Social history, and Family History were reviewed and updated.  Review of Systems: As above  Physical Exam:  weight is 224 lb (101.606 kg). Her oral temperature is 97.8 F (36.6 C). Her blood pressure is 150/99 and her pulse is 91.   Mildly obese white female. Head and neck exam shows no ocular or oral lesions. She has no palpable cervical or supraclavicular lymph nodes. Lungs are clear bilaterally. Cardiac exam regular rate and rhythm with no murmurs rubs or bruits. Abdomen is soft. She  has good bowel sounds. There is no fluid wave. There is no palpable hepato- splenomegaly. Back exam no tenderness over the spine ribs or hips. Extremities shows no clubbing cyanosis or edema. She has some swelling with her knees. She has some decreased range of motion of her knees. There is crepitus with knee range of motion. Neurological exam shows no focal neurological deficits. Skin exam no rashes.  Lab Results  Component Value Date   WBC 9.2 08/22/2014   HGB 13.0 08/22/2014   HCT 39.2 08/22/2014   MCV 89 08/22/2014   PLT 259 08/22/2014     Chemistry        Component Value Date/Time   NA 139 08/22/2014 0943   NA 137 05/01/2014 0959   K 3.8 08/22/2014 0943   K 3.9 05/01/2014 0959   CL 105 08/22/2014 0943   CL 102 05/01/2014 0959   CO2 27 08/22/2014 0943   CO2 24 05/01/2014 0959   BUN 13 08/22/2014 0943   BUN 14 05/01/2014 0959   CREATININE 0.9 08/22/2014 0943   CREATININE 0.69 05/01/2014 0959      Component Value Date/Time   CALCIUM 8.9 08/22/2014 0943   CALCIUM 9.1 05/01/2014 0959   ALKPHOS 110* 08/22/2014 0943   ALKPHOS 115 04/04/2014 1024   AST 31 08/22/2014 0943   AST 41* 04/04/2014 1024   ALT 31 08/22/2014 0943   ALT 63* 04/04/2014 1024   BILITOT 0.80 08/22/2014 0943   BILITOT 0.5 04/04/2014 1024         Impression and Plan: Ms. Pidcock is 54 year old white female with metastatic carcinoid. She's on Somatuline. She enjoys this as this is much more convenient for her. . She's done well with this. Her symptoms are very well controlled.  We will go ahead with a dose today.  We'll have her come back here monthly for her injections. We will see her back ourselves in about 2 or 3 months.   [name], MD 7/12/201610:46 AM

## 2014-08-25 ENCOUNTER — Encounter: Payer: Self-pay | Admitting: *Deleted

## 2014-08-25 LAB — HM MAMMOGRAPHY

## 2014-08-25 LAB — CHROMOGRANIN A: Chromogranin A: 9 ng/mL (ref <=15)

## 2014-08-31 ENCOUNTER — Encounter: Payer: Self-pay | Admitting: *Deleted

## 2014-09-09 ENCOUNTER — Other Ambulatory Visit: Payer: Self-pay | Admitting: Family

## 2014-09-22 ENCOUNTER — Ambulatory Visit (HOSPITAL_BASED_OUTPATIENT_CLINIC_OR_DEPARTMENT_OTHER): Payer: BLUE CROSS/BLUE SHIELD

## 2014-09-22 VITALS — BP 156/89 | HR 99 | Temp 98.1°F | Resp 20

## 2014-09-22 DIAGNOSIS — C7A00 Malignant carcinoid tumor of unspecified site: Secondary | ICD-10-CM

## 2014-09-22 DIAGNOSIS — C7B8 Other secondary neuroendocrine tumors: Secondary | ICD-10-CM

## 2014-09-22 DIAGNOSIS — C7B02 Secondary carcinoid tumors of liver: Secondary | ICD-10-CM | POA: Diagnosis not present

## 2014-09-22 MED ORDER — LANREOTIDE ACETATE 120 MG/0.5ML ~~LOC~~ SOLN
120.0000 mg | Freq: Once | SUBCUTANEOUS | Status: AC
  Administered 2014-09-22: 120 mg via SUBCUTANEOUS
  Filled 2014-09-22: qty 120

## 2014-09-22 NOTE — Patient Instructions (Signed)
Lanreotide injection What is this medicine? LANREOTIDE (lan REE oh tide) is used to reduce blood levels of growth hormone in patients with a condition called acromegaly. It also works to slow or stop tumor growth in patients with gastroenteropancreatic neuroendocrine tumor (GEP-NET). This medicine may be used for other purposes; ask your health care provider or pharmacist if you have questions. COMMON BRAND NAME(S): Somatuline Depot What should I tell my health care provider before I take this medicine? They need to know if you have any of these conditions: -diabetes -gallbladder disease -heart disease -kidney disease -liver disease -an unusual or allergic reaction to lanreotide, other medicines, latex, foods, dyes, or preservatives -pregnant or trying to get pregnant -breast-feeding How should I use this medicine? This medicine is for injection under the skin. It is given by a health care professional in a hospital or clinic setting. Contact your pediatrician or health care professional regarding the use of this medicine in children. Special care may be needed. Overdosage: If you think you have taken too much of this medicine contact a poison control center or emergency room at once. NOTE: This medicine is only for you. Do not share this medicine with others. What if I miss a dose? It is important not to miss your dose. Call your doctor or health care professional if you are unable to keep an appointment. What may interact with this medicine? -bromocriptine -cyclosporine -medicines for diabetes, including insulin -medicines for heart disease or hypertension -quinidine This list may not describe all possible interactions. Give your health care provider a list of all the medicines, herbs, non-prescription drugs, or dietary supplements you use. Also tell them if you smoke, drink alcohol, or use illegal drugs. Some items may interact with your medicine. What should I watch for while using  this medicine? Visit your doctor or health care professional for regular checks on your progress. Your condition will be monitored carefully while you are receiving this medicine. This medicine may cause increases or decreases in blood sugar. Signs of high blood sugar include frequent urination, unusual thirst, flushed or dry skin, difficulty breathing, drowsiness, stomach ache, nausea, vomiting or dry mouth. Signs of low blood sugar include chills, cool, pale skin or cold sweats, drowsiness, extreme hunger, fast heartbeat, headache, nausea, nervousness or anxiety, shakiness, trembling, unsteadiness, tiredness, or weakness. Contact your doctor or health care professional right away if you experience any of these symptoms. What side effects may I notice from receiving this medicine? Side effects that you should report to your doctor or health care professional as soon as possible: -allergic reactions like skin rash, itching or hives, swelling of the face, lips, or tongue -changes in blood sugar -changes in heart rate -severe stomach pain Side effects that usually do not require medical attention (report to your doctor or health care professional if they continue or are bothersome): -diarrhea or constipation -gas or stomach pain -nausea, vomiting -pain, redness, swelling and irritation at site where injected This list may not describe all possible side effects. Call your doctor for medical advice about side effects. You may report side effects to FDA at 1-800-FDA-1088. Where should I keep my medicine? This drug is given in a hospital or clinic and will not be stored at home. NOTE: This sheet is a summary. It may not cover all possible information. If you have questions about this medicine, talk to your doctor, pharmacist, or health care provider.  2015, Elsevier/Gold Standard. (2013-01-26 17:43:04)

## 2014-10-11 ENCOUNTER — Other Ambulatory Visit: Payer: Self-pay | Admitting: Family

## 2014-10-11 NOTE — Telephone Encounter (Signed)
90 day supply sent to [facility].  Pt last seen by PCP on 07/19/14 and advised to f/u in 4 weeks. Pt is past due.  Please call pt to schedule appt soon.  Thanks!

## 2014-10-23 ENCOUNTER — Ambulatory Visit (HOSPITAL_BASED_OUTPATIENT_CLINIC_OR_DEPARTMENT_OTHER): Payer: BLUE CROSS/BLUE SHIELD

## 2014-10-23 VITALS — BP 136/87 | HR 74 | Temp 98.5°F | Resp 16

## 2014-10-23 DIAGNOSIS — C7B8 Other secondary neuroendocrine tumors: Secondary | ICD-10-CM

## 2014-10-23 DIAGNOSIS — C7A00 Malignant carcinoid tumor of unspecified site: Secondary | ICD-10-CM | POA: Diagnosis not present

## 2014-10-23 DIAGNOSIS — C7B02 Secondary carcinoid tumors of liver: Secondary | ICD-10-CM | POA: Diagnosis not present

## 2014-10-23 MED ORDER — LANREOTIDE ACETATE 120 MG/0.5ML ~~LOC~~ SOLN
120.0000 mg | Freq: Once | SUBCUTANEOUS | Status: AC
  Administered 2014-10-23: 120 mg via SUBCUTANEOUS
  Filled 2014-10-23: qty 120

## 2014-10-23 NOTE — Patient Instructions (Signed)
Lanreotide injection What is this medicine? LANREOTIDE (lan REE oh tide) is used to reduce blood levels of growth hormone in patients with a condition called acromegaly. It also works to slow or stop tumor growth in patients with gastroenteropancreatic neuroendocrine tumor (GEP-NET). This medicine may be used for other purposes; ask your health care provider or pharmacist if you have questions. COMMON BRAND NAME(S): Somatuline Depot What should I tell my health care provider before I take this medicine? They need to know if you have any of these conditions: -diabetes -gallbladder disease -heart disease -kidney disease -liver disease -an unusual or allergic reaction to lanreotide, other medicines, latex, foods, dyes, or preservatives -pregnant or trying to get pregnant -breast-feeding How should I use this medicine? This medicine is for injection under the skin. It is given by a health care professional in a hospital or clinic setting. Contact your pediatrician or health care professional regarding the use of this medicine in children. Special care may be needed. Overdosage: If you think you have taken too much of this medicine contact a poison control center or emergency room at once. NOTE: This medicine is only for you. Do not share this medicine with others. What if I miss a dose? It is important not to miss your dose. Call your doctor or health care professional if you are unable to keep an appointment. What may interact with this medicine? -bromocriptine -cyclosporine -medicines for diabetes, including insulin -medicines for heart disease or hypertension -quinidine This list may not describe all possible interactions. Give your health care provider a list of all the medicines, herbs, non-prescription drugs, or dietary supplements you use. Also tell them if you smoke, drink alcohol, or use illegal drugs. Some items may interact with your medicine. What should I watch for while using  this medicine? Visit your doctor or health care professional for regular checks on your progress. Your condition will be monitored carefully while you are receiving this medicine. This medicine may cause increases or decreases in blood sugar. Signs of high blood sugar include frequent urination, unusual thirst, flushed or dry skin, difficulty breathing, drowsiness, stomach ache, nausea, vomiting or dry mouth. Signs of low blood sugar include chills, cool, pale skin or cold sweats, drowsiness, extreme hunger, fast heartbeat, headache, nausea, nervousness or anxiety, shakiness, trembling, unsteadiness, tiredness, or weakness. Contact your doctor or health care professional right away if you experience any of these symptoms. What side effects may I notice from receiving this medicine? Side effects that you should report to your doctor or health care professional as soon as possible: -allergic reactions like skin rash, itching or hives, swelling of the face, lips, or tongue -changes in blood sugar -changes in heart rate -severe stomach pain Side effects that usually do not require medical attention (report to your doctor or health care professional if they continue or are bothersome): -diarrhea or constipation -gas or stomach pain -nausea, vomiting -pain, redness, swelling and irritation at site where injected This list may not describe all possible side effects. Call your doctor for medical advice about side effects. You may report side effects to FDA at 1-800-FDA-1088. Where should I keep my medicine? This drug is given in a hospital or clinic and will not be stored at home. NOTE: This sheet is a summary. It may not cover all possible information. If you have questions about this medicine, talk to your doctor, pharmacist, or health care provider.  2015, Elsevier/Gold Standard. (2013-01-26 17:43:04)

## 2014-10-24 NOTE — Telephone Encounter (Signed)
Patient scheduled for 10/31/2014

## 2014-10-31 ENCOUNTER — Encounter: Payer: Self-pay | Admitting: Family

## 2014-10-31 ENCOUNTER — Ambulatory Visit (INDEPENDENT_AMBULATORY_CARE_PROVIDER_SITE_OTHER): Payer: BLUE CROSS/BLUE SHIELD | Admitting: Family

## 2014-10-31 VITALS — BP 146/88 | HR 63 | Temp 98.0°F | Resp 18 | Ht 59.0 in | Wt 221.0 lb

## 2014-10-31 DIAGNOSIS — I10 Essential (primary) hypertension: Secondary | ICD-10-CM

## 2014-10-31 DIAGNOSIS — F419 Anxiety disorder, unspecified: Secondary | ICD-10-CM | POA: Diagnosis not present

## 2014-10-31 DIAGNOSIS — E785 Hyperlipidemia, unspecified: Secondary | ICD-10-CM | POA: Diagnosis not present

## 2014-10-31 DIAGNOSIS — Z23 Encounter for immunization: Secondary | ICD-10-CM

## 2014-10-31 DIAGNOSIS — E119 Type 2 diabetes mellitus without complications: Secondary | ICD-10-CM | POA: Diagnosis not present

## 2014-10-31 LAB — LIPID PANEL
CHOLESTEROL: 156 mg/dL (ref 0–200)
HDL: 46.5 mg/dL (ref >39.00)
LDL Cholesterol: 87 mg/dL (ref 0–99)
NONHDL: 109.64
Total CHOL/HDL Ratio: 3
Triglycerides: 112 mg/dL (ref 0.0–149.0)
VLDL: 22.4 mg/dL (ref 0.0–40.0)

## 2014-10-31 LAB — HEMOGLOBIN A1C: Hgb A1c MFr Bld: 6.6 % — ABNORMAL HIGH (ref 4.6–6.5)

## 2014-10-31 NOTE — Progress Notes (Signed)
Pre visit review using our clinic review tool, if applicable. No additional management support is needed unless otherwise documented below in the visit note. 

## 2014-10-31 NOTE — Progress Notes (Signed)
Subjective:    Patient ID: Leah [name], female    DOB: 04-17-1960, 54 y.o.   MRN: [id number]  HPI  Leah [name] is a 54 yr old female who presents today for follow up.   1) HTN- maintained on diovan,amlodipine, toprol.Denies CP/SOB or swelling.  BP Readings from Last 3 Encounters:  10/31/14 146/88  10/23/14 136/87  09/22/14 156/89   2) anxiety- reports that anxiety has been well controlled on effexor.   3) DM2-  Lab Results  Component Value Date   HGBA1C 6.6* 03/24/2014   Lab Results  Component Value Date   HGBA1C 6.6* 03/24/2014   HGBA1C 5.8* 06/21/2013   HGBA1C 6.1* 03/14/2013   Lab Results  Component Value Date   MICROALBUR 9.0* 05/01/2014   LDLCALC 101* 07/22/2012   CREATININE 0.9 08/22/2014     Review of Systems    see HPI  Past Medical History  Diagnosis Date  . Sarcoidosis 2008  . Allergic rhinitis   . GE reflux   . Neuroendocrine tumor 2011  . Surgical menopause   . Migraines   . Atrophic vaginitis   . STD (sexually transmitted disease)     HSV I  . Hepatic encephalopathy 2011  . HTN (hypertension) 2011  . Carcinoid syndrome   . Borderline diabetes 03/17/2013  . Orthostatic hypotension   . Palpitations   . History of retinal vein occlusion     right eye    Social History   Social History  . Marital Status: Married    Spouse Name: N/A  . Number of Children: N/A  . Years of Education: N/A   Occupational History  . Not on file.   Social History Main Topics  . Smoking status: Never Smoker   . Smokeless tobacco: Never Used     Comment: never used tobacco  . Alcohol Use: No  . Drug Use: No  . Sexual Activity: Yes    Birth Control/ Protection: Surgical     Comment: hysterectomy   Other Topics Concern  . Not on file   Social History Narrative   Married to [place]   2 sons- 2001 [name] (1999)   Has dog   She is unemployed, has worked in the past (office work)   Completed some college   Enjoys Theatre manager     Past Surgical History  Procedure Laterality Date  . Tonsillectomy  1976  . Cesarean section  [id number]  . Cholecystectomy  2006  . Abdominal hysterectomy  2004  . Oophorectomy  2004  . Mastectomy  2008    bilateral  . Bilateral mastectectomy Bilateral 2008  . Latissimus dorsi graft  2008    used for reconstructive breast surgery  . Hepatetetomy    . Tubal ligation  2001    BTL  . Breast surgery      bilateral masectomy and reconstruction with silicone implants  . Knee arthroscopy Left 09/2010    Family History  Problem Relation Age of Onset  . Arthritis Maternal Grandmother   . Arthritis Maternal Grandfather   . Arthritis Paternal Grandmother   . Arthritis Paternal Grandfather   . Heart failure Paternal Grandfather   . Breast cancer Mother 74    died at 7  . Lupus Mother   . Heart failure Father   . Hypertension Father   . Hyperlipidemia Father   . Breast cancer Maternal Aunt 38    deceased at 68 recurrence  . Ovarian cancer Maternal Aunt  60     died with in 6 months  . Breast cancer Maternal Aunt 40    died at age 44    No Known Allergies  Current Outpatient Prescriptions on File Prior to Visit  Medication Sig Dispense Refill  . amLODipine (NORVASC) 10 MG tablet TAKE 1 TABLET DAILY 90 tablet 1  . aspirin EC 81 MG tablet Take 1 tablet (81 mg total) by mouth daily.    . [facility] ALLERGY RELIEF 180 MG tablet TAKE 1 TABLET BY MOUTH EVERY DAY 30 tablet 11  . fluticasone (FLONASE) 50 MCG/ACT nasal spray USE 2 SPRAYS IN BOTH NOSTRILS EVERY DAY 16 g 2  . metoprolol succinate (TOPROL-XL) 100 MG 24 hr tablet Take 1 tablet (100 mg total) by mouth daily. Take with or immediately following a meal. 30 tablet 3  . montelukast (SINGULAIR) 10 MG tablet TAKE 1 TABLET AT BEDTIME 90 tablet 1  . Multiple Vitamin (MULTI VITAMIN DAILY PO) Take by mouth every morning.    . naproxen (NAPROSYN) 500 MG tablet Take 1 tablet by mouth 2 (two) times daily.    . ondansetron (ZOFRAN ODT) 4 MG  disintegrating tablet Take 1 tablet (4 mg total) by mouth every 8 (eight) hours as needed for nausea or vomiting. 20 tablet 0  . pantoprazole (PROTONIX) 40 MG tablet TAKE 1 TABLET BY MOUTH TWICE A DAY 60 tablet 5  . PROVENTIL HFA 108 (90 BASE) MCG/ACT inhaler INHALE 2 PUFFS INTO THE LUNGS EVERY 6 (SIX) HOURS AS NEEDED. FOR SHORTNESS OF BREATH. 6.7 Inhaler 2  . valACYclovir (VALTREX) 500 MG tablet Take 500 mg by mouth 2 (two) times daily as needed. Cold sores    . valsartan (DIOVAN) 320 MG tablet Take 1 tablet (320 mg total) by mouth daily. 90 tablet 1  . venlafaxine XR (EFFEXOR-XR) 150 MG 24 hr capsule TAKE 1 CAPSULE DAILY 90 capsule 0   No current facility-administered medications on file prior to visit.    BP 146/88 mmHg  Pulse 63  Temp(Src) 98 F (36.7 C) (Oral)  Resp 18  Ht 4\' 11"  (1.499 m)  Wt 221 lb (100.245 kg)  BMI 44.61 kg/m2  SpO2 99%    Objective:   Physical Exam  Constitutional: She is oriented to person, place, and time. She appears well-developed and well-nourished.  HENT:  Head: Normocephalic and atraumatic.  Cardiovascular: Normal rate, regular rhythm and normal heart sounds.   No murmur heard. Pulmonary/Chest: Effort normal and breath sounds normal. No respiratory distress. She has no wheezes.  Musculoskeletal: She exhibits no edema.  Lymphadenopathy:    She has no cervical adenopathy.  Neurological: She is alert and oriented to person, place, and time.  Psychiatric: She has a normal mood and affect. Her behavior is normal. Judgment and thought content normal.          Assessment & Plan:  She is requesting zostavax today. She tells me she has cleared this with her oncologist and has verified insurance coverage

## 2014-10-31 NOTE — Patient Instructions (Addendum)
Please complete lab work prior to leaving.   

## 2014-11-03 NOTE — Assessment & Plan Note (Signed)
Lab Results  Component Value Date   HGBA1C 6.6* 10/31/2014   Follow up A1C remains at goal.  Monitor.

## 2014-11-03 NOTE — Assessment & Plan Note (Signed)
BP stable on current meds. Continue same.  

## 2014-11-03 NOTE — Assessment & Plan Note (Signed)
Improved on effexor, continue same.

## 2014-11-04 ENCOUNTER — Other Ambulatory Visit: Payer: Self-pay | Admitting: Hematology & Oncology

## 2014-11-16 ENCOUNTER — Other Ambulatory Visit: Payer: Self-pay | Admitting: Family

## 2014-11-23 ENCOUNTER — Encounter: Payer: Self-pay | Admitting: Hematology & Oncology

## 2014-11-23 ENCOUNTER — Ambulatory Visit (HOSPITAL_BASED_OUTPATIENT_CLINIC_OR_DEPARTMENT_OTHER): Payer: BLUE CROSS/BLUE SHIELD

## 2014-11-23 ENCOUNTER — Ambulatory Visit (HOSPITAL_BASED_OUTPATIENT_CLINIC_OR_DEPARTMENT_OTHER): Payer: BLUE CROSS/BLUE SHIELD | Admitting: Family

## 2014-11-23 ENCOUNTER — Other Ambulatory Visit (HOSPITAL_BASED_OUTPATIENT_CLINIC_OR_DEPARTMENT_OTHER): Payer: BLUE CROSS/BLUE SHIELD

## 2014-11-23 VITALS — BP 124/82 | HR 78 | Temp 97.9°F | Resp 16 | Wt 219.0 lb

## 2014-11-23 DIAGNOSIS — C7A00 Malignant carcinoid tumor of unspecified site: Secondary | ICD-10-CM

## 2014-11-23 DIAGNOSIS — C7B8 Other secondary neuroendocrine tumors: Secondary | ICD-10-CM

## 2014-11-23 DIAGNOSIS — C7B02 Secondary carcinoid tumors of liver: Secondary | ICD-10-CM | POA: Diagnosis not present

## 2014-11-23 LAB — COMPREHENSIVE METABOLIC PANEL (CC13)
ALK PHOS: 119 U/L (ref 40–150)
ALT: 29 U/L (ref 0–55)
AST: 19 U/L (ref 5–34)
Albumin: 3.6 g/dL (ref 3.5–5.0)
Anion Gap: 9 mEq/L (ref 3–11)
BUN: 15.6 mg/dL (ref 7.0–26.0)
CHLORIDE: 108 meq/L (ref 98–109)
CO2: 24 mEq/L (ref 22–29)
Calcium: 8.9 mg/dL (ref 8.4–10.4)
Creatinine: 0.7 mg/dL (ref 0.6–1.1)
GLUCOSE: 121 mg/dL (ref 70–140)
POTASSIUM: 3.8 meq/L (ref 3.5–5.1)
Sodium: 142 mEq/L (ref 136–145)
Total Bilirubin: 0.54 mg/dL (ref 0.20–1.20)
Total Protein: 7 g/dL (ref 6.4–8.3)

## 2014-11-23 LAB — CBC WITH DIFFERENTIAL (CANCER CENTER ONLY)
BASO#: 0 10*3/uL (ref 0.0–0.2)
BASO%: 0.3 % (ref 0.0–2.0)
EOS%: 1.8 % (ref 0.0–7.0)
Eosinophils Absolute: 0.2 10*3/uL (ref 0.0–0.5)
HCT: 38.7 % (ref 34.8–46.6)
HGB: 12.6 g/dL (ref 11.6–15.9)
LYMPH#: 3.1 10*3/uL (ref 0.9–3.3)
LYMPH%: 32.9 % (ref 14.0–48.0)
MCH: 29.4 pg (ref 26.0–34.0)
MCHC: 32.6 g/dL (ref 32.0–36.0)
MCV: 90 fL (ref 81–101)
MONO#: 0.7 10*3/uL (ref 0.1–0.9)
MONO%: 7 % (ref 0.0–13.0)
NEUT#: 5.4 10*3/uL (ref 1.5–6.5)
NEUT%: 58 % (ref 39.6–80.0)
PLATELETS: 263 10*3/uL (ref 145–400)
RBC: 4.28 10*6/uL (ref 3.70–5.32)
RDW: 14.1 % (ref 11.1–15.7)
WBC: 9.3 10*3/uL (ref 3.9–10.0)

## 2014-11-23 MED ORDER — LANREOTIDE ACETATE 120 MG/0.5ML ~~LOC~~ SOLN
120.0000 mg | Freq: Once | SUBCUTANEOUS | Status: AC
  Administered 2014-11-23: 120 mg via SUBCUTANEOUS
  Filled 2014-11-23: qty 120

## 2014-11-23 NOTE — Patient Instructions (Signed)
Lanreotide injection What is this medicine? LANREOTIDE (lan REE oh tide) is used to reduce blood levels of growth hormone in patients with a condition called acromegaly. It also works to slow or stop tumor growth in patients with gastroenteropancreatic neuroendocrine tumor (GEP-NET). This medicine may be used for other purposes; ask your health care provider or pharmacist if you have questions. What should I tell my health care provider before I take this medicine? They need to know if you have any of these conditions: -diabetes -gallbladder disease -heart disease -kidney disease -liver disease -an unusual or allergic reaction to lanreotide, other medicines, latex, foods, dyes, or preservatives -pregnant or trying to get pregnant -breast-feeding How should I use this medicine? This medicine is for injection under the skin. It is given by a health care professional in a hospital or clinic setting. Contact your pediatrician or health care professional regarding the use of this medicine in children. Special care may be needed. Overdosage: If you think you have taken too much of this medicine contact a poison control center or emergency room at once. NOTE: This medicine is only for you. Do not share this medicine with others. What if I miss a dose? It is important not to miss your dose. Call your doctor or health care professional if you are unable to keep an appointment. What may interact with this medicine? -bromocriptine -cyclosporine -medicines for diabetes, including insulin -medicines for heart disease or hypertension -quinidine This list may not describe all possible interactions. Give your health care provider a list of all the medicines, herbs, non-prescription drugs, or dietary supplements you use. Also tell them if you smoke, drink alcohol, or use illegal drugs. Some items may interact with your medicine. What should I watch for while using this medicine? Visit your doctor or  health care professional for regular checks on your progress. Your condition will be monitored carefully while you are receiving this medicine. This medicine may cause increases or decreases in blood sugar. Signs of high blood sugar include frequent urination, unusual thirst, flushed or dry skin, difficulty breathing, drowsiness, stomach ache, nausea, vomiting or dry mouth. Signs of low blood sugar include chills, cool, pale skin or cold sweats, drowsiness, extreme hunger, fast heartbeat, headache, nausea, nervousness or anxiety, shakiness, trembling, unsteadiness, tiredness, or weakness. Contact your doctor or health care professional right away if you experience any of these symptoms. What side effects may I notice from receiving this medicine? Side effects that you should report to your doctor or health care professional as soon as possible: -allergic reactions like skin rash, itching or hives, swelling of the face, lips, or tongue -changes in blood sugar -changes in heart rate -severe stomach pain Side effects that usually do not require medical attention (report to your doctor or health care professional if they continue or are bothersome): -diarrhea or constipation -gas or stomach pain -nausea, vomiting -pain, redness, swelling and irritation at site where injected This list may not describe all possible side effects. Call your doctor for medical advice about side effects. You may report side effects to FDA at 1-800-FDA-1088. Where should I keep my medicine? This drug is given in a hospital or clinic and will not be stored at home. NOTE: This sheet is a summary. It may not cover all possible information. If you have questions about this medicine, talk to your doctor, pharmacist, or health care provider.    2016, Elsevier/Gold Standard. (2013-01-26 17:43:04)

## 2014-11-23 NOTE — Progress Notes (Signed)
Hematology and Oncology Follow Up Visit  Leah [name] [id number] [birth year] 54 y.o. 11/23/2014   Principle Diagnosis:  Metastatic neuroendocrine carcinoma-carcinoid  Current Therapy:   Somatuline 120 mg subcutaneous every 4 weeks    Interim History:  Leah [name] is here today with her husband for a follow-up. She is doing better. She did not have any episodes of diarrhea or nausea after her last shot.  Her chromogranin A level was 9 in July. She has done well on Somatuline.  She is having some fatigue and has not been resting well at night. She has some stress at home with her son.  No fever, chills, n/v, cough, rash, dizziness, vision changes, SOB, chest pain, palpitations, abdominal pain or changes in bowel or bladder habits. She has not noticed any blood in her stool.  She is eating well and staying hydrated. Her weight is stable.  No swelling or tenderness in her extremities.   Medications:    Medication List       This list is accurate as of: 11/23/14  1:26 PM.  Always use your most recent med list.               amLODipine 10 MG tablet  Commonly known as:  NORVASC  TAKE 1 TABLET DAILY     aspirin EC 81 MG tablet  Take 1 tablet (81 mg total) by mouth daily.     [facility] ALLERGY RELIEF 180 MG tablet  Generic drug:  fexofenadine  TAKE 1 TABLET BY MOUTH EVERY DAY     fluticasone 50 MCG/ACT nasal spray  Commonly known as:  FLONASE  USE 2 SPRAYS IN BOTH NOSTRILS EVERY DAY     metoprolol succinate 100 MG 24 hr tablet  Commonly known as:  TOPROL-XL  Take 1 tablet (100 mg total) by mouth daily. Take with or immediately following a meal.     montelukast 10 MG tablet  Commonly known as:  SINGULAIR  TAKE 1 TABLET AT BEDTIME     MULTI VITAMIN DAILY PO  Take by mouth every morning.     naproxen 500 MG tablet  Commonly known as:  NAPROSYN  Take 1 tablet by mouth 2 (two) times daily.     ondansetron 4 MG disintegrating tablet  Commonly known as:  ZOFRAN ODT  Take 1  tablet (4 mg total) by mouth every 8 (eight) hours as needed for nausea or vomiting.     OSPHENA 60 MG Tabs  Generic drug:  Ospemifene  Take 60 mg by mouth daily.     pantoprazole 40 MG tablet  Commonly known as:  PROTONIX  TAKE 1 TABLET BY MOUTH TWICE A DAY     PROVENTIL HFA 108 (90 BASE) MCG/ACT inhaler  Generic drug:  albuterol  INHALE 2 PUFFS INTO THE LUNGS EVERY 6 (SIX) HOURS AS NEEDED. FOR SHORTNESS OF BREATH.     valACYclovir 500 MG tablet  Commonly known as:  VALTREX  Take 500 mg by mouth 2 (two) times daily as needed. Cold sores     valsartan 320 MG tablet  Commonly known as:  DIOVAN  TAKE 1 TABLET DAILY     venlafaxine XR 150 MG 24 hr capsule  Commonly known as:  EFFEXOR-XR  TAKE 1 CAPSULE DAILY        Allergies: No Known Allergies  Past Medical History, Surgical history, Social history, and Family History were reviewed and updated.  Review of Systems: All other 10 point review of systems is negative.  Physical Exam:  weight is 219 lb (99.338 kg). Her oral temperature is 97.9 F (36.6 C). Her blood pressure is 124/82 and her pulse is 78. Her respiration is 16.   Wt Readings from Last 3 Encounters:  11/23/14 219 lb (99.338 kg)  10/31/14 221 lb (100.245 kg)  08/22/14 224 lb (101.606 kg)    Ocular: Sclerae unicteric, pupils equal, round and reactive to light Ear-nose-throat: Oropharynx clear, dentition fair Lymphatic: No cervical or supraclavicular adenopathy Lungs no rales or rhonchi, good excursion bilaterally Heart regular rate and rhythm, no murmur appreciated Abd soft, nontender, positive bowel sounds MSK no focal spinal tenderness, no joint edema Neuro: non-focal, well-oriented, appropriate affect Breasts: Deferred  Lab Results  Component Value Date   WBC 9.3 11/23/2014   HGB 12.6 11/23/2014   HCT 38.7 11/23/2014   MCV 90 11/23/2014   PLT 263 11/23/2014   Lab Results  Component Value Date   FERRITIN 382* 10/18/2008   IRON <10*  10/18/2008   TIBC NOT CALC Not calculated due to Iron <10. 10/18/2008   UIBC 156 10/18/2008   IRONPCTSAT NOT CALC Not calculated due to Iron <10. 10/18/2008   Lab Results  Component Value Date   RETICCTPCT 0.9 10/18/2008   RBC 4.28 11/23/2014   No results found for: KPAFRELGTCHN, LAMBDASER, KAPLAMBRATIO No results found for: IGGSERUM, IGA, IGMSERUM No results found for: Odetta Pink, SPEI   Chemistry      Component Value Date/Time   NA 139 08/22/2014 0943   NA 137 05/01/2014 0959   K 3.8 08/22/2014 0943   K 3.9 05/01/2014 0959   CL 105 08/22/2014 0943   CL 102 05/01/2014 0959   CO2 27 08/22/2014 0943   CO2 24 05/01/2014 0959   BUN 13 08/22/2014 0943   BUN 14 05/01/2014 0959   CREATININE 0.9 08/22/2014 0943   CREATININE 0.69 05/01/2014 0959      Component Value Date/Time   CALCIUM 8.9 08/22/2014 0943   CALCIUM 9.1 05/01/2014 0959   ALKPHOS 110* 08/22/2014 0943   ALKPHOS 115 04/04/2014 1024   AST 31 08/22/2014 0943   AST 41* 04/04/2014 1024   ALT 31 08/22/2014 0943   ALT 63* 04/04/2014 1024   BILITOT 0.80 08/22/2014 0943   BILITOT 0.5 04/04/2014 1024     Impression and Plan: Leah [name] is 54 year old white female with a metastatic neuroendocrine carcinoid. So far she is doing well on Somatuline. She had no adverse symptoms after her last injection.  Her chromogranin A level today is pending.  She will receive her injection today and continue them monthly.  We will plan to see her back for labs and follow-up in 3 months.  She will contact us with any questions or concerns. We can certainly see her sooner if need be.   Eliezer Bottom, NP 10/13/20161:26 PM

## 2014-11-24 ENCOUNTER — Encounter: Payer: Self-pay | Admitting: Hematology & Oncology

## 2014-11-27 LAB — CHROMOGRANIN A: Chromogranin A: 6 ng/mL (ref <=15)

## 2014-11-27 LAB — LACTATE DEHYDROGENASE: LDH: 193 U/L (ref 94–250)

## 2014-11-28 ENCOUNTER — Ambulatory Visit: Payer: BLUE CROSS/BLUE SHIELD

## 2014-12-12 DIAGNOSIS — H348322 Tributary (branch) retinal vein occlusion, left eye, stable: Secondary | ICD-10-CM | POA: Insufficient documentation

## 2014-12-12 DIAGNOSIS — H35033 Hypertensive retinopathy, bilateral: Secondary | ICD-10-CM | POA: Insufficient documentation

## 2014-12-12 DIAGNOSIS — H3581 Retinal edema: Secondary | ICD-10-CM | POA: Insufficient documentation

## 2014-12-17 ENCOUNTER — Other Ambulatory Visit: Payer: Self-pay | Admitting: Family

## 2014-12-18 NOTE — Telephone Encounter (Signed)
Medication Detail      Disp Refills Start End     metoprolol succinate (TOPROL-XL) 100 MG 24 hr tablet 30 tablet 3 07/19/2014     Sig - Route: Take 1 tablet (100 mg total) by mouth daily. Take with or immediately following a meal. - Oral    E-Prescribing Status: Receipt confirmed by pharmacy (07/19/2014 11:04 AM EDT)     Pharmacy    [id number] - [place], [place] - [address]   LAST OV: 10/2014 ROV: 3-Mths Refill sent per [facility] refill protocol/SLS

## 2014-12-26 ENCOUNTER — Ambulatory Visit (HOSPITAL_BASED_OUTPATIENT_CLINIC_OR_DEPARTMENT_OTHER): Payer: BLUE CROSS/BLUE SHIELD

## 2014-12-26 ENCOUNTER — Telehealth: Payer: Self-pay | Admitting: *Deleted

## 2014-12-26 VITALS — BP 121/70 | HR 79 | Temp 98.1°F | Resp 18

## 2014-12-26 DIAGNOSIS — C7B02 Secondary carcinoid tumors of liver: Secondary | ICD-10-CM

## 2014-12-26 DIAGNOSIS — C7A00 Malignant carcinoid tumor of unspecified site: Secondary | ICD-10-CM

## 2014-12-26 DIAGNOSIS — C7B8 Other secondary neuroendocrine tumors: Secondary | ICD-10-CM

## 2014-12-26 MED ORDER — LANREOTIDE ACETATE 120 MG/0.5ML ~~LOC~~ SOLN
120.0000 mg | Freq: Once | SUBCUTANEOUS | Status: AC
  Administered 2014-12-26: 120 mg via SUBCUTANEOUS
  Filled 2014-12-26: qty 120

## 2014-12-26 MED ORDER — VENLAFAXINE HCL ER 150 MG PO CP24: 150.0000 mg | ORAL_CAPSULE | Freq: Every day | ORAL | Status: DC

## 2014-12-26 NOTE — Telephone Encounter (Signed)
Received fax from [facility] requesting Venlafaxine ER 150mg  once a day.  Last Rx 10/11/14, #90.  Pt has f/u in 01/2015.  Refill sent.

## 2014-12-26 NOTE — Patient Instructions (Signed)
Lanreotide injection What is this medicine? LANREOTIDE (lan REE oh tide) is used to reduce blood levels of growth hormone in patients with a condition called acromegaly. It also works to slow or stop tumor growth in patients with gastroenteropancreatic neuroendocrine tumor (GEP-NET). This medicine may be used for other purposes; ask your health care provider or pharmacist if you have questions. What should I tell my health care provider before I take this medicine? They need to know if you have any of these conditions: -diabetes -gallbladder disease -heart disease -kidney disease -liver disease -an unusual or allergic reaction to lanreotide, other medicines, latex, foods, dyes, or preservatives -pregnant or trying to get pregnant -breast-feeding How should I use this medicine? This medicine is for injection under the skin. It is given by a health care professional in a hospital or clinic setting. Contact your pediatrician or health care professional regarding the use of this medicine in children. Special care may be needed. Overdosage: If you think you have taken too much of this medicine contact a poison control center or emergency room at once. NOTE: This medicine is only for you. Do not share this medicine with others. What if I miss a dose? It is important not to miss your dose. Call your doctor or health care professional if you are unable to keep an appointment. What may interact with this medicine? -bromocriptine -cyclosporine -medicines for diabetes, including insulin -medicines for heart disease or hypertension -quinidine This list may not describe all possible interactions. Give your health care provider a list of all the medicines, herbs, non-prescription drugs, or dietary supplements you use. Also tell them if you smoke, drink alcohol, or use illegal drugs. Some items may interact with your medicine. What should I watch for while using this medicine? Visit your doctor or  health care professional for regular checks on your progress. Your condition will be monitored carefully while you are receiving this medicine. This medicine may cause increases or decreases in blood sugar. Signs of high blood sugar include frequent urination, unusual thirst, flushed or dry skin, difficulty breathing, drowsiness, stomach ache, nausea, vomiting or dry mouth. Signs of low blood sugar include chills, cool, pale skin or cold sweats, drowsiness, extreme hunger, fast heartbeat, headache, nausea, nervousness or anxiety, shakiness, trembling, unsteadiness, tiredness, or weakness. Contact your doctor or health care professional right away if you experience any of these symptoms. What side effects may I notice from receiving this medicine? Side effects that you should report to your doctor or health care professional as soon as possible: -allergic reactions like skin rash, itching or hives, swelling of the face, lips, or tongue -changes in blood sugar -changes in heart rate -severe stomach pain Side effects that usually do not require medical attention (report to your doctor or health care professional if they continue or are bothersome): -diarrhea or constipation -gas or stomach pain -nausea, vomiting -pain, redness, swelling and irritation at site where injected This list may not describe all possible side effects. Call your doctor for medical advice about side effects. You may report side effects to FDA at 1-800-FDA-1088. Where should I keep my medicine? This drug is given in a hospital or clinic and will not be stored at home. NOTE: This sheet is a summary. It may not cover all possible information. If you have questions about this medicine, talk to your doctor, pharmacist, or health care provider.    2016, Elsevier/Gold Standard. (2013-01-26 17:43:04)  

## 2015-01-03 ENCOUNTER — Other Ambulatory Visit: Payer: Self-pay | Admitting: Family

## 2015-01-13 LAB — HM DIABETES EYE EXAM

## 2015-01-21 ENCOUNTER — Other Ambulatory Visit: Payer: Self-pay | Admitting: Family

## 2015-01-22 MED ORDER — ASPIRIN EC 81 MG PO TBEC
81.0000 mg | DELAYED_RELEASE_TABLET | Freq: Every day | ORAL | Status: DC
Start: 2015-01-22 — End: 2015-08-15

## 2015-01-22 NOTE — Telephone Encounter (Signed)
Melissa-- please advise ondansetron refill?  Pt has appt with you tomorrow.

## 2015-01-23 ENCOUNTER — Encounter: Payer: Self-pay | Admitting: Family

## 2015-01-23 ENCOUNTER — Other Ambulatory Visit: Payer: Self-pay | Admitting: Family

## 2015-01-23 ENCOUNTER — Ambulatory Visit (INDEPENDENT_AMBULATORY_CARE_PROVIDER_SITE_OTHER): Payer: BLUE CROSS/BLUE SHIELD | Admitting: Family

## 2015-01-23 VITALS — BP 135/67 | HR 72 | Temp 98.5°F | Resp 16 | Ht 59.0 in | Wt 226.4 lb

## 2015-01-23 DIAGNOSIS — F419 Anxiety disorder, unspecified: Secondary | ICD-10-CM

## 2015-01-23 DIAGNOSIS — E875 Hyperkalemia: Secondary | ICD-10-CM

## 2015-01-23 DIAGNOSIS — I10 Essential (primary) hypertension: Secondary | ICD-10-CM

## 2015-01-23 DIAGNOSIS — E1121 Type 2 diabetes mellitus with diabetic nephropathy: Secondary | ICD-10-CM

## 2015-01-23 DIAGNOSIS — R911 Solitary pulmonary nodule: Secondary | ICD-10-CM

## 2015-01-23 LAB — BASIC METABOLIC PANEL
BUN: 21 mg/dL (ref 6–23)
CALCIUM: 9.1 mg/dL (ref 8.4–10.5)
CO2: 30 mEq/L (ref 19–32)
CREATININE: 0.77 mg/dL (ref 0.40–1.20)
Chloride: 102 mEq/L (ref 96–112)
GFR: 82.46 mL/min (ref >60.00)
GLUCOSE: 136 mg/dL — AB (ref 70–99)
Potassium: 5 mEq/L (ref 3.5–5.1)
Sodium: 139 mEq/L (ref 135–145)

## 2015-01-23 LAB — HEMOGLOBIN A1C: Hgb A1c MFr Bld: 6.8 % — ABNORMAL HIGH (ref 4.6–6.5)

## 2015-01-23 MED ORDER — PANTOPRAZOLE SODIUM 40 MG PO TBEC: 40.0000 mg | DELAYED_RELEASE_TABLET | Freq: Two times a day (BID) | ORAL | Status: DC

## 2015-01-23 MED ORDER — AMLODIPINE BESYLATE 10 MG PO TABS: 10.0000 mg | ORAL_TABLET | Freq: Every day | ORAL | Status: DC

## 2015-01-23 MED ORDER — MONTELUKAST SODIUM 10 MG PO TABS: 10.0000 mg | ORAL_TABLET | Freq: Every day | ORAL | Status: DC

## 2015-01-23 MED ORDER — VALSARTAN 320 MG PO TABS: 320.0000 mg | ORAL_TABLET | Freq: Every day | ORAL | Status: DC

## 2015-01-23 MED ORDER — ONDANSETRON 4 MG PO TBDP: 4.0000 mg | ORAL_TABLET | Freq: Three times a day (TID) | ORAL | Status: DC | PRN

## 2015-01-23 NOTE — Progress Notes (Signed)
Pre visit review using our clinic review tool, if applicable. No additional management support is needed unless otherwise documented below in the visit note. 

## 2015-01-23 NOTE — Assessment & Plan Note (Signed)
BP stable on current meds, continue same.  

## 2015-01-23 NOTE — Assessment & Plan Note (Signed)
Stable on effexor, continue same. 

## 2015-01-23 NOTE — Progress Notes (Signed)
Subjective:    Patient ID: Leah [name], female    DOB: 1960/09/27, 54 y.o.   MRN: [id number]  HPI  Leah [name] is a 54 yr old female who presents today for follow up.  1) DM2- not checking sugars.   Lab Results  Component Value Date   HGBA1C 6.6* 10/31/2014   HGBA1C 6.6* 03/24/2014   HGBA1C 5.8* 06/21/2013   Lab Results  Component Value Date   MICROALBUR 9.0* 05/01/2014   LDLCALC 87 10/31/2014   CREATININE 0.7 11/23/2014   2) HTN- Diovan, amlodipine and metoprolol. Denies CP or SOB BP Readings from Last 3 Encounters:  12/26/14 121/70  11/23/14 124/82  10/31/14 146/88   3) Anxiety- maintained on effexor. Reports mood and anxiety is good.    Review of Systems See HPI  Past Medical History  Diagnosis Date  . Sarcoidosis ([place]) 2008  . Allergic rhinitis   . GE reflux   . Neuroendocrine tumor 2011  . Surgical menopause   . Migraines   . Atrophic vaginitis   . STD (sexually transmitted disease)     HSV I  . Hepatic encephalopathy ([place]) 2011  . HTN (hypertension) 2011  . Carcinoid syndrome ([place])   . Borderline diabetes 03/17/2013  . Orthostatic hypotension   . Palpitations   . History of retinal vein occlusion     right eye    Social History   Social History  . Marital Status: Married    Spouse Name: N/A  . Number of Children: N/A  . Years of Education: N/A   Occupational History  . Not on file.   Social History Main Topics  . Smoking status: Never Smoker   . Smokeless tobacco: Never Used     Comment: never used tobacco  . Alcohol Use: No  . Drug Use: No  . Sexual Activity: Yes    Birth Control/ Protection: Surgical     Comment: hysterectomy   Other Topics Concern  . Not on file   Social History Narrative   Married to [place]   2 sons- 2001 [name] (1999)   Has dog   She is unemployed, has worked in the past (office work)   Completed some college   Enjoys Theatre manager    Past Surgical History  Procedure Laterality Date  .  Tonsillectomy  1976  . Cesarean section  [id number]  . Cholecystectomy  2006  . Abdominal hysterectomy  2004  . Oophorectomy  2004  . Mastectomy  2008    bilateral  . Bilateral mastectectomy Bilateral 2008  . Latissimus dorsi graft  2008    used for reconstructive breast surgery  . Hepatetetomy    . Tubal ligation  2001    BTL  . Breast surgery      bilateral masectomy and reconstruction with silicone implants  . Knee arthroscopy Left 09/2010    Family History  Problem Relation Age of Onset  . Arthritis Maternal Grandmother   . Arthritis Maternal Grandfather   . Arthritis Paternal Grandmother   . Arthritis Paternal Grandfather   . Heart failure Paternal Grandfather   . Breast cancer Mother 28    died at 20  . Lupus Mother   . Heart failure Father   . Hypertension Father   . Hyperlipidemia Father   . Breast cancer Maternal Aunt 38    deceased at 3 recurrence  . Ovarian cancer Maternal Aunt 70     died with in 79 months  .  Breast cancer Maternal Aunt 40    died at age 82    No Known Allergies  Current Outpatient Prescriptions on File Prior to Visit  Medication Sig Dispense Refill  . amLODipine (NORVASC) 10 MG tablet TAKE 1 TABLET DAILY 90 tablet 0  . aspirin EC 81 MG tablet Take 1 tablet (81 mg total) by mouth daily. 100 tablet 1  . [facility] ALLERGY RELIEF 180 MG tablet TAKE 1 TABLET BY MOUTH EVERY DAY 30 tablet 11  . fluticasone (FLONASE) 50 MCG/ACT nasal spray USE 2 SPRAYS IN BOTH NOSTRILS EVERY DAY 16 g 2  . metoprolol succinate (TOPROL-XL) 100 MG 24 hr tablet TAKE 1 TABLET BY MOUTH EVERY DAY WITH OR IMMEDIATELY FOLLOWING A MEAL 30 tablet 3  . montelukast (SINGULAIR) 10 MG tablet TAKE 1 TABLET AT BEDTIME 90 tablet 0  . Multiple Vitamin (MULTI VITAMIN DAILY PO) Take by mouth every morning.    . naproxen (NAPROSYN) 500 MG tablet Take 1 tablet by mouth 2 (two) times daily.    . ondansetron (ZOFRAN ODT) 4 MG disintegrating tablet Take 1 tablet (4 mg total) by mouth every 8  (eight) hours as needed for nausea or vomiting. 20 tablet 0  . Ospemifene (OSPHENA) 60 MG TABS Take 60 mg by mouth daily.    . pantoprazole (PROTONIX) 40 MG tablet TAKE 1 TABLET BY MOUTH TWICE A DAY 60 tablet 5  . PROVENTIL HFA 108 (90 BASE) MCG/ACT inhaler INHALE 2 PUFFS INTO THE LUNGS EVERY 6 (SIX) HOURS AS NEEDED. FOR SHORTNESS OF BREATH. 6.7 Inhaler 5  . valACYclovir (VALTREX) 500 MG tablet Take 500 mg by mouth 2 (two) times daily as needed. Cold sores    . valsartan (DIOVAN) 320 MG tablet TAKE 1 TABLET DAILY 90 tablet 0  . venlafaxine XR (EFFEXOR-XR) 150 MG 24 hr capsule Take 1 capsule (150 mg total) by mouth daily. 90 capsule 1   No current facility-administered medications on file prior to visit.    BP   Pulse 72  Temp(Src) 98.5 F (36.9 C) (Oral)  Resp 16  Ht 4\' 11"  (1.499 m)  Wt 226 lb 6.4 oz (102.694 kg)  BMI 45.70 kg/m2  SpO2 99%       Objective:   Physical Exam  Constitutional: She is oriented to person, place, and time. She appears well-developed and well-nourished.  HENT:  Head: Normocephalic and atraumatic.  Cardiovascular: Normal rate, regular rhythm and normal heart sounds.   No murmur heard. Pulmonary/Chest: Effort normal and breath sounds normal. No respiratory distress. She has no wheezes.  Musculoskeletal: She exhibits no edema.  Neurological: She is alert and oriented to person, place, and time.  Psychiatric: She has a normal mood and affect. Her behavior is normal. Judgment and thought content normal.          Assessment & Plan:  Pulmonary nodule- noted on CT 3/16, will needs 12 month follow up 3/17- CT ordered.

## 2015-01-23 NOTE — Patient Instructions (Signed)
Please complete lab work prior to leaving. You will be contacted about scheduling your CT scan in March.

## 2015-01-23 NOTE — Assessment & Plan Note (Signed)
Stable, obtain A1C.  

## 2015-01-30 ENCOUNTER — Ambulatory Visit (HOSPITAL_BASED_OUTPATIENT_CLINIC_OR_DEPARTMENT_OTHER): Payer: BLUE CROSS/BLUE SHIELD

## 2015-01-30 VITALS — BP 122/63 | HR 78 | Temp 98.2°F | Resp 18

## 2015-01-30 DIAGNOSIS — C7B8 Other secondary neuroendocrine tumors: Secondary | ICD-10-CM

## 2015-01-30 DIAGNOSIS — Z5111 Encounter for antineoplastic chemotherapy: Secondary | ICD-10-CM | POA: Diagnosis not present

## 2015-01-30 DIAGNOSIS — C7B02 Secondary carcinoid tumors of liver: Secondary | ICD-10-CM

## 2015-01-30 DIAGNOSIS — C7A00 Malignant carcinoid tumor of unspecified site: Secondary | ICD-10-CM | POA: Diagnosis not present

## 2015-01-30 MED ORDER — LANREOTIDE ACETATE 120 MG/0.5ML ~~LOC~~ SOLN
120.0000 mg | Freq: Once | SUBCUTANEOUS | Status: AC
  Administered 2015-01-30: 120 mg via SUBCUTANEOUS
  Filled 2015-01-30: qty 120

## 2015-01-30 NOTE — Patient Instructions (Signed)
Lanreotide injection What is this medicine? LANREOTIDE (lan REE oh tide) is used to reduce blood levels of growth hormone in patients with a condition called acromegaly. It also works to slow or stop tumor growth in patients with gastroenteropancreatic neuroendocrine tumor (GEP-NET). This medicine may be used for other purposes; ask your health care provider or pharmacist if you have questions. What should I tell my health care provider before I take this medicine? They need to know if you have any of these conditions: -diabetes -gallbladder disease -heart disease -kidney disease -liver disease -an unusual or allergic reaction to lanreotide, other medicines, latex, foods, dyes, or preservatives -pregnant or trying to get pregnant -breast-feeding How should I use this medicine? This medicine is for injection under the skin. It is given by a health care professional in a hospital or clinic setting. Contact your pediatrician or health care professional regarding the use of this medicine in children. Special care may be needed. Overdosage: If you think you have taken too much of this medicine contact a poison control center or emergency room at once. NOTE: This medicine is only for you. Do not share this medicine with others. What if I miss a dose? It is important not to miss your dose. Call your doctor or health care professional if you are unable to keep an appointment. What may interact with this medicine? -bromocriptine -cyclosporine -medicines for diabetes, including insulin -medicines for heart disease or hypertension -quinidine This list may not describe all possible interactions. Give your health care provider a list of all the medicines, herbs, non-prescription drugs, or dietary supplements you use. Also tell them if you smoke, drink alcohol, or use illegal drugs. Some items may interact with your medicine. What should I watch for while using this medicine? Visit your doctor or  health care professional for regular checks on your progress. Your condition will be monitored carefully while you are receiving this medicine. This medicine may cause increases or decreases in blood sugar. Signs of high blood sugar include frequent urination, unusual thirst, flushed or dry skin, difficulty breathing, drowsiness, stomach ache, nausea, vomiting or dry mouth. Signs of low blood sugar include chills, cool, pale skin or cold sweats, drowsiness, extreme hunger, fast heartbeat, headache, nausea, nervousness or anxiety, shakiness, trembling, unsteadiness, tiredness, or weakness. Contact your doctor or health care professional right away if you experience any of these symptoms. What side effects may I notice from receiving this medicine? Side effects that you should report to your doctor or health care professional as soon as possible: -allergic reactions like skin rash, itching or hives, swelling of the face, lips, or tongue -changes in blood sugar -changes in heart rate -severe stomach pain Side effects that usually do not require medical attention (report to your doctor or health care professional if they continue or are bothersome): -diarrhea or constipation -gas or stomach pain -nausea, vomiting -pain, redness, swelling and irritation at site where injected This list may not describe all possible side effects. Call your doctor for medical advice about side effects. You may report side effects to FDA at 1-800-FDA-1088. Where should I keep my medicine? This drug is given in a hospital or clinic and will not be stored at home. NOTE: This sheet is a summary. It may not cover all possible information. If you have questions about this medicine, talk to your doctor, pharmacist, or health care provider.    2016, Elsevier/Gold Standard. (2013-01-26 17:43:04)  

## 2015-02-22 ENCOUNTER — Other Ambulatory Visit: Payer: Self-pay | Admitting: Internal Medicine

## 2015-02-27 ENCOUNTER — Ambulatory Visit (HOSPITAL_BASED_OUTPATIENT_CLINIC_OR_DEPARTMENT_OTHER): Payer: Managed Care, Other (non HMO) | Admitting: Family

## 2015-02-27 ENCOUNTER — Telehealth: Payer: Self-pay | Admitting: Hematology & Oncology

## 2015-02-27 ENCOUNTER — Other Ambulatory Visit (HOSPITAL_BASED_OUTPATIENT_CLINIC_OR_DEPARTMENT_OTHER): Payer: Managed Care, Other (non HMO)

## 2015-02-27 ENCOUNTER — Ambulatory Visit (HOSPITAL_BASED_OUTPATIENT_CLINIC_OR_DEPARTMENT_OTHER): Payer: Managed Care, Other (non HMO)

## 2015-02-27 ENCOUNTER — Encounter: Payer: Self-pay | Admitting: Family

## 2015-02-27 VITALS — BP 142/93 | HR 88 | Temp 98.3°F | Resp 18 | Ht 59.0 in | Wt 228.0 lb

## 2015-02-27 DIAGNOSIS — C7A00 Malignant carcinoid tumor of unspecified site: Secondary | ICD-10-CM

## 2015-02-27 DIAGNOSIS — C7B8 Other secondary neuroendocrine tumors: Secondary | ICD-10-CM

## 2015-02-27 LAB — COMPREHENSIVE METABOLIC PANEL
ALT: 31 U/L (ref 0–55)
ANION GAP: 8 meq/L (ref 3–11)
AST: 24 U/L (ref 5–34)
Albumin: 3.7 g/dL (ref 3.5–5.0)
Alkaline Phosphatase: 105 U/L (ref 40–150)
BILIRUBIN TOTAL: 0.43 mg/dL (ref 0.20–1.20)
BUN: 13.2 mg/dL (ref 7.0–26.0)
CALCIUM: 9.2 mg/dL (ref 8.4–10.4)
CHLORIDE: 105 meq/L (ref 98–109)
CO2: 25 meq/L (ref 22–29)
CREATININE: 0.8 mg/dL (ref 0.6–1.1)
EGFR: 82 mL/min/{1.73_m2} — AB (ref >90)
Glucose: 154 mg/dl — ABNORMAL HIGH (ref 70–140)
Potassium: 3.9 mEq/L (ref 3.5–5.1)
Sodium: 139 mEq/L (ref 136–145)
TOTAL PROTEIN: 7.4 g/dL (ref 6.4–8.3)

## 2015-02-27 LAB — CBC WITH DIFFERENTIAL (CANCER CENTER ONLY)
BASO#: 0 10*3/uL (ref 0.0–0.2)
BASO%: 0.4 % (ref 0.0–2.0)
EOS ABS: 0.2 10*3/uL (ref 0.0–0.5)
EOS%: 1.8 % (ref 0.0–7.0)
HCT: 38.8 % (ref 34.8–46.6)
HEMOGLOBIN: 12.7 g/dL (ref 11.6–15.9)
LYMPH#: 2.4 10*3/uL (ref 0.9–3.3)
LYMPH%: 26.3 % (ref 14.0–48.0)
MCH: 29.1 pg (ref 26.0–34.0)
MCHC: 32.7 g/dL (ref 32.0–36.0)
MCV: 89 fL (ref 81–101)
MONO#: 0.6 10*3/uL (ref 0.1–0.9)
MONO%: 6.8 % (ref 0.0–13.0)
NEUT#: 5.8 10*3/uL (ref 1.5–6.5)
NEUT%: 64.7 % (ref 39.6–80.0)
PLATELETS: 263 10*3/uL (ref 145–400)
RBC: 4.36 10*6/uL (ref 3.70–5.32)
RDW: 13.9 % (ref 11.1–15.7)
WBC: 9 10*3/uL (ref 3.9–10.0)

## 2015-02-27 LAB — LACTATE DEHYDROGENASE: LDH: 236 U/L (ref 125–245)

## 2015-02-27 MED ORDER — LANREOTIDE ACETATE 120 MG/0.5ML ~~LOC~~ SOLN
120.0000 mg | Freq: Once | SUBCUTANEOUS | Status: AC
  Administered 2015-02-27: 120 mg via SUBCUTANEOUS
  Filled 2015-02-27: qty 120

## 2015-02-27 NOTE — Progress Notes (Signed)
Hematology and Oncology Follow Up Visit  DASIAH NEES [id number] [birth year] 55 y.o. 02/27/2015   Principle Diagnosis:  Metastatic neuroendocrine carcinoma-carcinoid  Current Therapy:   Somatuline 120 mg subcutaneous every 4 weeks    Interim History:  Ms. Mccanless is here today with her husband for a follow-up. She is doing fairly well. She states that she is having neck and joint pain and feels that her sarcoidosis may be starting to flare up.  She had some mild nausea, vomiting and right sided abdominal pain after her last Somatuline injection. This lasted 2 days and has not recurred since.   Her chromogranin A level was 6 last month.   No fever, chills, cough, rash, dizziness, headache, vision changes, SOB, chest pain, palpitations, abdominal pain or changes in bowel or bladder habits.  No swelling in her extremities. She has some tingling in her hands at times with her joint pain. She states that she has noticed this is positional.  No lymphadenopathy found on exam. No episodes of bleeding or bruising.  She states that her appetite comes and goes. She is eating and staying hydrated. Her weight is unchanged.   Medications:    Medication List       This list is accurate as of: 02/27/15 11:38 AM.  Always use your most recent med list.               amLODipine 10 MG tablet  Commonly known as:  NORVASC  Take 1 tablet (10 mg total) by mouth daily.     aspirin EC 81 MG tablet  Take 1 tablet (81 mg total) by mouth daily.     [facility] ALLERGY RELIEF 180 MG tablet  Generic drug:  fexofenadine  TAKE 1 TABLET BY MOUTH EVERY DAY     fluticasone 50 MCG/ACT nasal spray  Commonly known as:  FLONASE  USE 2 SPRAYS IN BOTH NOSTRILS EVERY DAY     metoprolol succinate 100 MG 24 hr tablet  Commonly known as:  TOPROL-XL  TAKE 1 TABLET BY MOUTH EVERY DAY WITH OR IMMEDIATELY FOLLOWING A MEAL     MULTI VITAMIN DAILY PO  Take by mouth every morning.     naproxen 500 MG tablet  Commonly known  as:  NAPROSYN  Take 1 tablet by mouth 2 (two) times daily.     ondansetron 4 MG disintegrating tablet  Commonly known as:  ZOFRAN ODT  Take 1 tablet (4 mg total) by mouth every 8 (eight) hours as needed for nausea or vomiting.     OSPHENA 60 MG Tabs  Generic drug:  Ospemifene  Take 60 mg by mouth daily.     pantoprazole 40 MG tablet  Commonly known as:  PROTONIX  Take 1 tablet (40 mg total) by mouth 2 (two) times daily.     PROVENTIL HFA 108 (90 Base) MCG/ACT inhaler  Generic drug:  albuterol  INHALE 2 PUFFS INTO THE LUNGS EVERY 6 (SIX) HOURS AS NEEDED. FOR SHORTNESS OF BREATH.     valACYclovir 500 MG tablet  Commonly known as:  VALTREX  Take 500 mg by mouth 2 (two) times daily as needed. Cold sores     valsartan 320 MG tablet  Commonly known as:  DIOVAN  Take 1 tablet (320 mg total) by mouth daily.     venlafaxine XR 150 MG 24 hr capsule  Commonly known as:  EFFEXOR-XR  Take 1 capsule (150 mg total) by mouth daily.        Allergies:  No Known Allergies  Past Medical History, Surgical history, Social history, and Family History were reviewed and updated.  Review of Systems: All other 10 point review of systems is negative.   Physical Exam:  vitals were not taken for this visit.  Wt Readings from Last 3 Encounters:  01/23/15 226 lb 6.4 oz (102.694 kg)  11/23/14 219 lb (99.338 kg)  10/31/14 221 lb (100.245 kg)    Ocular: Sclerae unicteric, pupils equal, round and reactive to light Ear-nose-throat: Oropharynx clear, dentition fair Lymphatic: No cervical supraclavicular or axillary adenopathy Lungs no rales or rhonchi, good excursion bilaterally Heart regular rate and rhythm, no murmur appreciated Abd soft, nontender, positive bowel sounds, no liver or spleen tip palpated on exam MSK no focal spinal tenderness, no joint edema Neuro: non-focal, well-oriented, appropriate affect Breasts: Deferred  Lab Results  Component Value Date   WBC 9.0 02/27/2015   HGB  12.7 02/27/2015   HCT 38.8 02/27/2015   MCV 89 02/27/2015   PLT 263 02/27/2015   Lab Results  Component Value Date   FERRITIN 382* 10/18/2008   IRON <10* 10/18/2008   TIBC NOT CALC Not calculated due to Iron <10. 10/18/2008   UIBC 156 10/18/2008   IRONPCTSAT NOT CALC Not calculated due to Iron <10. 10/18/2008   Lab Results  Component Value Date   RETICCTPCT 0.9 10/18/2008   RBC 4.36 02/27/2015   No results found for: KPAFRELGTCHN, LAMBDASER, KAPLAMBRATIO No results found for: IGGSERUM, IGA, IGMSERUM No results found for: Kathrynn Ducking, MSPIKE, SPEI   Chemistry      Component Value Date/Time   NA 139 01/23/2015 1026   NA 142 11/23/2014 1204   NA 139 08/22/2014 0943   K 5.0 01/23/2015 1026   K 3.8 11/23/2014 1204   K 3.8 08/22/2014 0943   CL 102 01/23/2015 1026   CL 105 08/22/2014 0943   CO2 30 01/23/2015 1026   CO2 24 11/23/2014 1204   CO2 27 08/22/2014 0943   BUN 21 01/23/2015 1026   BUN 15.6 11/23/2014 1204   BUN 13 08/22/2014 0943   CREATININE 0.77 01/23/2015 1026   CREATININE 0.7 11/23/2014 1204   CREATININE 0.9 08/22/2014 0943      Component Value Date/Time   CALCIUM 9.1 01/23/2015 1026   CALCIUM 8.9 11/23/2014 1204   CALCIUM 8.9 08/22/2014 0943   ALKPHOS 119 11/23/2014 1204   ALKPHOS 110* 08/22/2014 0943   ALKPHOS 115 04/04/2014 1024   AST 19 11/23/2014 1204   AST 31 08/22/2014 0943   AST 41* 04/04/2014 1024   ALT 29 11/23/2014 1204   ALT 31 08/22/2014 0943   ALT 63* 04/04/2014 1024   BILITOT 0.54 11/23/2014 1204   BILITOT 0.80 08/22/2014 0943   BILITOT 0.5 04/04/2014 1024     Impression and Plan: Ms. Nand is 55 year old white female with a metastatic neuroendocrine carcinoid. She has done well for the most part but did have some n/v and right sided abdominal pain after her last injection. She was able to manage her symptoms and they resolved after a day or two.  She will continue her monthly Somatuline  injections.  We will plan to see her back for labs and follow-up in 3 months.  She will contact us with any questions or concerns. We can certainly see her sooner if need be.   Eliezer Bottom, NP 1/17/201711:38 AM

## 2015-02-27 NOTE — Telephone Encounter (Addendum)
[facility] APPROVED Auth [id number] Dates: 02/27/2015 - 05/28/2015 [id number] - lanreotide acetate (SOMATULINE DEPOT) injection 120 mg    Dx: C7B.8    COPY SCANNED

## 2015-02-27 NOTE — Patient Instructions (Signed)
Lanreotide injection What is this medicine? LANREOTIDE (lan REE oh tide) is used to reduce blood levels of growth hormone in patients with a condition called acromegaly. It also works to slow or stop tumor growth in patients with gastroenteropancreatic neuroendocrine tumor (GEP-NET). This medicine may be used for other purposes; ask your health care provider or pharmacist if you have questions. What should I tell my health care provider before I take this medicine? They need to know if you have any of these conditions: -diabetes -gallbladder disease -heart disease -kidney disease -liver disease -an unusual or allergic reaction to lanreotide, other medicines, latex, foods, dyes, or preservatives -pregnant or trying to get pregnant -breast-feeding How should I use this medicine? This medicine is for injection under the skin. It is given by a health care professional in a hospital or clinic setting. Contact your pediatrician or health care professional regarding the use of this medicine in children. Special care may be needed. Overdosage: If you think you have taken too much of this medicine contact a poison control center or emergency room at once. NOTE: This medicine is only for you. Do not share this medicine with others. What if I miss a dose? It is important not to miss your dose. Call your doctor or health care professional if you are unable to keep an appointment. What may interact with this medicine? -bromocriptine -cyclosporine -medicines for diabetes, including insulin -medicines for heart disease or hypertension -quinidine This list may not describe all possible interactions. Give your health care provider a list of all the medicines, herbs, non-prescription drugs, or dietary supplements you use. Also tell them if you smoke, drink alcohol, or use illegal drugs. Some items may interact with your medicine. What should I watch for while using this medicine? Visit your doctor or  health care professional for regular checks on your progress. Your condition will be monitored carefully while you are receiving this medicine. This medicine may cause increases or decreases in blood sugar. Signs of high blood sugar include frequent urination, unusual thirst, flushed or dry skin, difficulty breathing, drowsiness, stomach ache, nausea, vomiting or dry mouth. Signs of low blood sugar include chills, cool, pale skin or cold sweats, drowsiness, extreme hunger, fast heartbeat, headache, nausea, nervousness or anxiety, shakiness, trembling, unsteadiness, tiredness, or weakness. Contact your doctor or health care professional right away if you experience any of these symptoms. What side effects may I notice from receiving this medicine? Side effects that you should report to your doctor or health care professional as soon as possible: -allergic reactions like skin rash, itching or hives, swelling of the face, lips, or tongue -changes in blood sugar -changes in heart rate -severe stomach pain Side effects that usually do not require medical attention (report to your doctor or health care professional if they continue or are bothersome): -diarrhea or constipation -gas or stomach pain -nausea, vomiting -pain, redness, swelling and irritation at site where injected This list may not describe all possible side effects. Call your doctor for medical advice about side effects. You may report side effects to FDA at 1-800-FDA-1088. Where should I keep my medicine? This drug is given in a hospital or clinic and will not be stored at home. NOTE: This sheet is a summary. It may not cover all possible information. If you have questions about this medicine, talk to your doctor, pharmacist, or health care provider.    2016, Elsevier/Gold Standard. (2013-01-26 17:43:04)  

## 2015-02-28 LAB — CHROMOGRANIN A: CHROMOGRAN A: 1 nmol/L (ref 0–5)

## 2015-03-02 LAB — CHROMOGRANIN A (PARALLEL TESTING): Chromogranin A: 7 ng/mL (ref <=15)

## 2015-03-22 ENCOUNTER — Other Ambulatory Visit: Payer: Self-pay | Admitting: Family

## 2015-03-23 NOTE — Telephone Encounter (Signed)
Spoke with pt and verified that she will no longer use [facility] and can [facility]. [facility]

## 2015-04-03 ENCOUNTER — Ambulatory Visit (HOSPITAL_BASED_OUTPATIENT_CLINIC_OR_DEPARTMENT_OTHER): Payer: Managed Care, Other (non HMO)

## 2015-04-03 ENCOUNTER — Other Ambulatory Visit: Payer: Self-pay | Admitting: *Deleted

## 2015-04-03 VITALS — BP 139/103 | HR 84 | Temp 98.1°F | Resp 18

## 2015-04-03 DIAGNOSIS — C7A00 Malignant carcinoid tumor of unspecified site: Secondary | ICD-10-CM

## 2015-04-03 DIAGNOSIS — C7B8 Other secondary neuroendocrine tumors: Secondary | ICD-10-CM

## 2015-04-03 MED ORDER — PANTOPRAZOLE SODIUM 40 MG PO TBEC: 40.0000 mg | DELAYED_RELEASE_TABLET | Freq: Every day | ORAL | Status: DC

## 2015-04-03 MED ORDER — LANREOTIDE ACETATE 120 MG/0.5ML ~~LOC~~ SOLN
120.0000 mg | Freq: Once | SUBCUTANEOUS | Status: AC
  Administered 2015-04-03: 120 mg via SUBCUTANEOUS
  Filled 2015-04-03: qty 120

## 2015-04-03 NOTE — Telephone Encounter (Signed)
[facility] requesting clarification on pantoprazole 40mg  twice a day Rx. Pharmacy states insurance will only pay for once daily dose. Pt told pharmacy she is only taking it once a day. Refills over the last year have been for twice daily dosing. Please advise?

## 2015-04-03 NOTE — Telephone Encounter (Signed)
New rx sent with note to replace previous Rx with twice daily dosing.

## 2015-04-03 NOTE — Patient Instructions (Signed)
Lanreotide injection What is this medicine? LANREOTIDE (lan REE oh tide) is used to reduce blood levels of growth hormone in patients with a condition called acromegaly. It also works to slow or stop tumor growth in patients with gastroenteropancreatic neuroendocrine tumor (GEP-NET). This medicine may be used for other purposes; ask your health care provider or pharmacist if you have questions. What should I tell my health care provider before I take this medicine? They need to know if you have any of these conditions: -diabetes -gallbladder disease -heart disease -kidney disease -liver disease -an unusual or allergic reaction to lanreotide, other medicines, latex, foods, dyes, or preservatives -pregnant or trying to get pregnant -breast-feeding How should I use this medicine? This medicine is for injection under the skin. It is given by a health care professional in a hospital or clinic setting. Contact your pediatrician or health care professional regarding the use of this medicine in children. Special care may be needed. Overdosage: If you think you have taken too much of this medicine contact a poison control center or emergency room at once. NOTE: This medicine is only for you. Do not share this medicine with others. What if I miss a dose? It is important not to miss your dose. Call your doctor or health care professional if you are unable to keep an appointment. What may interact with this medicine? -bromocriptine -cyclosporine -medicines for diabetes, including insulin -medicines for heart disease or hypertension -quinidine This list may not describe all possible interactions. Give your health care provider a list of all the medicines, herbs, non-prescription drugs, or dietary supplements you use. Also tell them if you smoke, drink alcohol, or use illegal drugs. Some items may interact with your medicine. What should I watch for while using this medicine? Visit your doctor or  health care professional for regular checks on your progress. Your condition will be monitored carefully while you are receiving this medicine. This medicine may cause increases or decreases in blood sugar. Signs of high blood sugar include frequent urination, unusual thirst, flushed or dry skin, difficulty breathing, drowsiness, stomach ache, nausea, vomiting or dry mouth. Signs of low blood sugar include chills, cool, pale skin or cold sweats, drowsiness, extreme hunger, fast heartbeat, headache, nausea, nervousness or anxiety, shakiness, trembling, unsteadiness, tiredness, or weakness. Contact your doctor or health care professional right away if you experience any of these symptoms. What side effects may I notice from receiving this medicine? Side effects that you should report to your doctor or health care professional as soon as possible: -allergic reactions like skin rash, itching or hives, swelling of the face, lips, or tongue -changes in blood sugar -changes in heart rate -severe stomach pain Side effects that usually do not require medical attention (report to your doctor or health care professional if they continue or are bothersome): -diarrhea or constipation -gas or stomach pain -nausea, vomiting -pain, redness, swelling and irritation at site where injected This list may not describe all possible side effects. Call your doctor for medical advice about side effects. You may report side effects to FDA at 1-800-FDA-1088. Where should I keep my medicine? This drug is given in a hospital or clinic and will not be stored at home. NOTE: This sheet is a summary. It may not cover all possible information. If you have questions about this medicine, talk to your doctor, pharmacist, or health care provider.    2016, Elsevier/Gold Standard. (2013-01-26 17:43:04)  

## 2015-04-03 NOTE — Telephone Encounter (Signed)
Please change to once daily 

## 2015-04-13 ENCOUNTER — Ambulatory Visit (HOSPITAL_BASED_OUTPATIENT_CLINIC_OR_DEPARTMENT_OTHER)
Admission: RE | Admit: 2015-04-13 | Discharge: 2015-04-13 | Disposition: A | Discharge status: Home / Self Care | Payer: Managed Care, Other (non HMO) | Source: Ambulatory Visit | Attending: Family | Admitting: Family

## 2015-04-13 DIAGNOSIS — R911 Solitary pulmonary nodule: Secondary | ICD-10-CM | POA: Diagnosis not present

## 2015-04-14 ENCOUNTER — Encounter: Payer: Self-pay | Admitting: Family

## 2015-05-01 ENCOUNTER — Ambulatory Visit (HOSPITAL_BASED_OUTPATIENT_CLINIC_OR_DEPARTMENT_OTHER): Payer: Managed Care, Other (non HMO)

## 2015-05-01 VITALS — BP 147/89 | HR 80 | Temp 98.2°F | Resp 18

## 2015-05-01 DIAGNOSIS — C7A00 Malignant carcinoid tumor of unspecified site: Secondary | ICD-10-CM | POA: Diagnosis not present

## 2015-05-01 DIAGNOSIS — C7B8 Other secondary neuroendocrine tumors: Secondary | ICD-10-CM

## 2015-05-01 MED ORDER — LANREOTIDE ACETATE 120 MG/0.5ML ~~LOC~~ SOLN
120.0000 mg | Freq: Once | SUBCUTANEOUS | Status: AC
  Administered 2015-05-01: 120 mg via SUBCUTANEOUS
  Filled 2015-05-01: qty 120

## 2015-05-01 NOTE — Patient Instructions (Signed)
Lanreotide injection What is this medicine? LANREOTIDE (lan REE oh tide) is used to reduce blood levels of growth hormone in patients with a condition called acromegaly. It also works to slow or stop tumor growth in patients with gastroenteropancreatic neuroendocrine tumor (GEP-NET). This medicine may be used for other purposes; ask your health care provider or pharmacist if you have questions. What should I tell my health care provider before I take this medicine? They need to know if you have any of these conditions: -diabetes -gallbladder disease -heart disease -kidney disease -liver disease -an unusual or allergic reaction to lanreotide, other medicines, latex, foods, dyes, or preservatives -pregnant or trying to get pregnant -breast-feeding How should I use this medicine? This medicine is for injection under the skin. It is given by a health care professional in a hospital or clinic setting. Contact your pediatrician or health care professional regarding the use of this medicine in children. Special care may be needed. Overdosage: If you think you have taken too much of this medicine contact a poison control center or emergency room at once. NOTE: This medicine is only for you. Do not share this medicine with others. What if I miss a dose? It is important not to miss your dose. Call your doctor or health care professional if you are unable to keep an appointment. What may interact with this medicine? -bromocriptine -cyclosporine -medicines for diabetes, including insulin -medicines for heart disease or hypertension -quinidine This list may not describe all possible interactions. Give your health care provider a list of all the medicines, herbs, non-prescription drugs, or dietary supplements you use. Also tell them if you smoke, drink alcohol, or use illegal drugs. Some items may interact with your medicine. What should I watch for while using this medicine? Visit your doctor or  health care professional for regular checks on your progress. Your condition will be monitored carefully while you are receiving this medicine. This medicine may cause increases or decreases in blood sugar. Signs of high blood sugar include frequent urination, unusual thirst, flushed or dry skin, difficulty breathing, drowsiness, stomach ache, nausea, vomiting or dry mouth. Signs of low blood sugar include chills, cool, pale skin or cold sweats, drowsiness, extreme hunger, fast heartbeat, headache, nausea, nervousness or anxiety, shakiness, trembling, unsteadiness, tiredness, or weakness. Contact your doctor or health care professional right away if you experience any of these symptoms. What side effects may I notice from receiving this medicine? Side effects that you should report to your doctor or health care professional as soon as possible: -allergic reactions like skin rash, itching or hives, swelling of the face, lips, or tongue -changes in blood sugar -changes in heart rate -severe stomach pain Side effects that usually do not require medical attention (report to your doctor or health care professional if they continue or are bothersome): -diarrhea or constipation -gas or stomach pain -nausea, vomiting -pain, redness, swelling and irritation at site where injected This list may not describe all possible side effects. Call your doctor for medical advice about side effects. You may report side effects to FDA at 1-800-FDA-1088. Where should I keep my medicine? This drug is given in a hospital or clinic and will not be stored at home. NOTE: This sheet is a summary. It may not cover all possible information. If you have questions about this medicine, talk to your doctor, pharmacist, or health care provider.    2016, Elsevier/Gold Standard. (2013-01-26 17:43:04)  

## 2015-05-10 ENCOUNTER — Encounter: Payer: Self-pay | Admitting: Family

## 2015-05-11 MED ORDER — VENLAFAXINE HCL ER 150 MG PO CP24: 150.0000 mg | ORAL_CAPSULE | Freq: Every day | ORAL | Status: DC

## 2015-05-28 ENCOUNTER — Ambulatory Visit: Payer: Managed Care, Other (non HMO) | Admitting: Family

## 2015-05-28 ENCOUNTER — Ambulatory Visit: Payer: Managed Care, Other (non HMO)

## 2015-05-28 ENCOUNTER — Other Ambulatory Visit: Payer: Managed Care, Other (non HMO)

## 2015-05-29 ENCOUNTER — Other Ambulatory Visit (HOSPITAL_BASED_OUTPATIENT_CLINIC_OR_DEPARTMENT_OTHER): Payer: Managed Care, Other (non HMO)

## 2015-05-29 ENCOUNTER — Encounter: Payer: Self-pay | Admitting: Family

## 2015-05-29 ENCOUNTER — Ambulatory Visit (HOSPITAL_BASED_OUTPATIENT_CLINIC_OR_DEPARTMENT_OTHER): Payer: Managed Care, Other (non HMO) | Admitting: Family

## 2015-05-29 ENCOUNTER — Ambulatory Visit (HOSPITAL_BASED_OUTPATIENT_CLINIC_OR_DEPARTMENT_OTHER): Payer: Managed Care, Other (non HMO)

## 2015-05-29 VITALS — BP 149/84 | HR 68 | Temp 98.2°F | Resp 18 | Ht <= 58 in | Wt 231.0 lb

## 2015-05-29 DIAGNOSIS — C7B8 Other secondary neuroendocrine tumors: Secondary | ICD-10-CM

## 2015-05-29 DIAGNOSIS — C7A00 Malignant carcinoid tumor of unspecified site: Secondary | ICD-10-CM | POA: Diagnosis not present

## 2015-05-29 LAB — CBC WITH DIFFERENTIAL (CANCER CENTER ONLY)
BASO#: 0.1 10*3/uL (ref 0.0–0.2)
BASO%: 0.7 % (ref 0.0–2.0)
EOS ABS: 0.2 10*3/uL (ref 0.0–0.5)
EOS%: 2.2 % (ref 0.0–7.0)
HCT: 39.8 % (ref 34.8–46.6)
HGB: 13 g/dL (ref 11.6–15.9)
LYMPH#: 2.6 10*3/uL (ref 0.9–3.3)
LYMPH%: 27.9 % (ref 14.0–48.0)
MCH: 28.7 pg (ref 26.0–34.0)
MCHC: 32.7 g/dL (ref 32.0–36.0)
MCV: 88 fL (ref 81–101)
MONO#: 0.8 10*3/uL (ref 0.1–0.9)
MONO%: 8.2 % (ref 0.0–13.0)
NEUT%: 61 % (ref 39.6–80.0)
NEUTROS ABS: 5.6 10*3/uL (ref 1.5–6.5)
PLATELETS: 282 10*3/uL (ref 145–400)
RBC: 4.53 10*6/uL (ref 3.70–5.32)
RDW: 14.4 % (ref 11.1–15.7)
WBC: 9.2 10*3/uL (ref 3.9–10.0)

## 2015-05-29 LAB — CMP (CANCER CENTER ONLY)
ALBUMIN: 3.6 g/dL (ref 3.3–5.5)
ALT(SGPT): 36 U/L (ref 10–47)
AST: 32 U/L (ref 11–38)
Alkaline Phosphatase: 90 U/L — ABNORMAL HIGH (ref 26–84)
BILIRUBIN TOTAL: 0.6 mg/dL (ref 0.20–1.60)
BUN: 14 mg/dL (ref 7–22)
CHLORIDE: 104 meq/L (ref 98–108)
CO2: 25 meq/L (ref 18–33)
CREATININE: 0.8 mg/dL (ref 0.6–1.2)
Calcium: 9.5 mg/dL (ref 8.0–10.3)
Glucose, Bld: 152 mg/dL — ABNORMAL HIGH (ref 73–118)
Potassium: 4.6 mEq/L (ref 3.3–4.7)
SODIUM: 141 meq/L (ref 128–145)
TOTAL PROTEIN: 7.6 g/dL (ref 6.4–8.1)

## 2015-05-29 LAB — LACTATE DEHYDROGENASE: LDH: 213 U/L (ref 125–245)

## 2015-05-29 MED ORDER — LANREOTIDE ACETATE 120 MG/0.5ML ~~LOC~~ SOLN
120.0000 mg | Freq: Once | SUBCUTANEOUS | Status: AC
  Administered 2015-05-29: 120 mg via SUBCUTANEOUS
  Filled 2015-05-29: qty 120

## 2015-05-29 NOTE — Patient Instructions (Signed)
Lanreotide injection What is this medicine? LANREOTIDE (lan REE oh tide) is used to reduce blood levels of growth hormone in patients with a condition called acromegaly. It also works to slow or stop tumor growth in patients with gastroenteropancreatic neuroendocrine tumor (GEP-NET). This medicine may be used for other purposes; ask your health care provider or pharmacist if you have questions. What should I tell my health care provider before I take this medicine? They need to know if you have any of these conditions: -diabetes -gallbladder disease -heart disease -kidney disease -liver disease -an unusual or allergic reaction to lanreotide, other medicines, latex, foods, dyes, or preservatives -pregnant or trying to get pregnant -breast-feeding How should I use this medicine? This medicine is for injection under the skin. It is given by a health care professional in a hospital or clinic setting. Contact your pediatrician or health care professional regarding the use of this medicine in children. Special care may be needed. Overdosage: If you think you have taken too much of this medicine contact a poison control center or emergency room at once. NOTE: This medicine is only for you. Do not share this medicine with others. What if I miss a dose? It is important not to miss your dose. Call your doctor or health care professional if you are unable to keep an appointment. What may interact with this medicine? -bromocriptine -cyclosporine -medicines for diabetes, including insulin -medicines for heart disease or hypertension -quinidine This list may not describe all possible interactions. Give your health care provider a list of all the medicines, herbs, non-prescription drugs, or dietary supplements you use. Also tell them if you smoke, drink alcohol, or use illegal drugs. Some items may interact with your medicine. What should I watch for while using this medicine? Visit your doctor or  health care professional for regular checks on your progress. Your condition will be monitored carefully while you are receiving this medicine. This medicine may cause increases or decreases in blood sugar. Signs of high blood sugar include frequent urination, unusual thirst, flushed or dry skin, difficulty breathing, drowsiness, stomach ache, nausea, vomiting or dry mouth. Signs of low blood sugar include chills, cool, pale skin or cold sweats, drowsiness, extreme hunger, fast heartbeat, headache, nausea, nervousness or anxiety, shakiness, trembling, unsteadiness, tiredness, or weakness. Contact your doctor or health care professional right away if you experience any of these symptoms. What side effects may I notice from receiving this medicine? Side effects that you should report to your doctor or health care professional as soon as possible: -allergic reactions like skin rash, itching or hives, swelling of the face, lips, or tongue -changes in blood sugar -changes in heart rate -severe stomach pain Side effects that usually do not require medical attention (report to your doctor or health care professional if they continue or are bothersome): -diarrhea or constipation -gas or stomach pain -nausea, vomiting -pain, redness, swelling and irritation at site where injected This list may not describe all possible side effects. Call your doctor for medical advice about side effects. You may report side effects to FDA at 1-800-FDA-1088. Where should I keep my medicine? This drug is given in a hospital or clinic and will not be stored at home. NOTE: This sheet is a summary. It may not cover all possible information. If you have questions about this medicine, talk to your doctor, pharmacist, or health care provider.    2016, Elsevier/Gold Standard. (2013-01-26 17:43:04)  

## 2015-05-29 NOTE — Progress Notes (Signed)
Hematology and Oncology Follow Up Visit  Leah [name] [id number] October 14, 1960 55 y.o. 05/29/2015   Principle Diagnosis:  Metastatic neuroendocrine carcinoma-carcinoid  Current Therapy:   Somatuline 120 mg subcutaneous every 4 weeks    Interim History:  Leah [name] is here today with her husband for a follow-up. She is doing fairly well. She is under a lot of stress at home with her kids and mother in law being ill. She has had some fatigue and feels that she is having a flare with sarcoidosis. She has had some joint pain in her hands. She plans to follow-up with her orthopedist for prednisone if needed.  Her chromogranin A level in January was 1.  No fever, chills, cough, rash, dizziness, headache, vision changes, SOB, chest pain, palpitations, abdominal pain or changes in bowel or bladder habits.  She still has occasional bouts of nausea, no vomiting.  No swelling, numbness or tingling in her extremities.  No lymphadenopathy found on exam. No episodes of bleeding or bruising.  She has a good appetite and is staying well hydrated. Her weight is stable.   Medications:    Medication List       This list is accurate as of: 05/29/15 10:11 AM.  Always use your most recent med list.               amLODipine 10 MG tablet  Commonly known as:  NORVASC  Take 1 tablet (10 mg total) by mouth daily.     aspirin EC 81 MG tablet  Take 1 tablet (81 mg total) by mouth daily.     [facility] ALLERGY RELIEF 180 MG tablet  Generic drug:  fexofenadine  TAKE 1 TABLET BY MOUTH EVERY DAY     fluticasone 50 MCG/ACT nasal spray  Commonly known as:  FLONASE  USE 2 SPRAYS IN BOTH NOSTRILS EVERY DAY     metoprolol succinate 100 MG 24 hr tablet  Commonly known as:  TOPROL-XL  TAKE 1 TABLET BY MOUTH EVERY DAY WITH OR IMMEDIATELY FOLLOWING A MEAL     MULTI VITAMIN DAILY PO  Take by mouth every morning.     naproxen 500 MG tablet  Commonly known as:  NAPROSYN  Take 1 tablet by mouth 2 (two) times daily.      ondansetron 4 MG disintegrating tablet  Commonly known as:  ZOFRAN ODT  Take 1 tablet (4 mg total) by mouth every 8 (eight) hours as needed for nausea or vomiting.     OSPHENA 60 MG Tabs  Generic drug:  Ospemifene  Take 60 mg by mouth daily.     pantoprazole 40 MG tablet  Commonly known as:  PROTONIX  Take 1 tablet (40 mg total) by mouth daily.     PROVENTIL HFA 108 (90 Base) MCG/ACT inhaler  Generic drug:  albuterol  INHALE 2 PUFFS INTO THE LUNGS EVERY 6 (SIX) HOURS AS NEEDED. FOR SHORTNESS OF BREATH.     valACYclovir 500 MG tablet  Commonly known as:  VALTREX  Take 500 mg by mouth 2 (two) times daily as needed. Cold sores     valsartan 320 MG tablet  Commonly known as:  DIOVAN  TAKE 1 TABLET (320 MG TOTAL) BY MOUTH DAILY.     venlafaxine XR 150 MG 24 hr capsule  Commonly known as:  EFFEXOR-XR  Take 1 capsule (150 mg total) by mouth daily.        Allergies: No Known Allergies  Past Medical History, Surgical history, Social history, and  Family History were reviewed and updated.  Review of Systems: All other 10 point review of systems is negative.   Physical Exam:  height is 4\' 10"  (1.473 m) and weight is 231 lb (104.781 kg). Her oral temperature is 98.2 F (36.8 C). Her blood pressure is 149/84 and her pulse is 68. Her respiration is 18.   Wt Readings from Last 3 Encounters:  05/29/15 231 lb (104.781 kg)  02/27/15 228 lb (103.42 kg)  01/23/15 226 lb 6.4 oz (102.694 kg)    Ocular: Sclerae unicteric, pupils equal, round and reactive to light Ear-nose-throat: Oropharynx clear, dentition fair Lymphatic: No cervical supraclavicular or axillary adenopathy Lungs no rales or rhonchi, good excursion bilaterally Heart regular rate and rhythm, no murmur appreciated Abd soft, nontender, positive bowel sounds, no liver or spleen tip palpated on exam, no fluid wave MSK no focal spinal tenderness, no joint edema Neuro: non-focal, well-oriented, appropriate  affect Breasts: Deferred  Lab Results  Component Value Date   WBC 9.2 05/29/2015   HGB 13.0 05/29/2015   HCT 39.8 05/29/2015   MCV 88 05/29/2015   PLT 282 05/29/2015   Lab Results  Component Value Date   FERRITIN 382* 10/18/2008   IRON <10* 10/18/2008   TIBC NOT CALC Not calculated due to Iron <10. 10/18/2008   UIBC 156 10/18/2008   IRONPCTSAT NOT CALC Not calculated due to Iron <10. 10/18/2008   Lab Results  Component Value Date   RETICCTPCT 0.9 10/18/2008   RBC 4.53 05/29/2015   No results found for: KPAFRELGTCHN, LAMBDASER, KAPLAMBRATIO No results found for: IGGSERUM, IGA, IGMSERUM No results found for: Kathrynn Ducking, MSPIKE, SPEI   Chemistry      Component Value Date/Time   NA 139 02/27/2015 1120   NA 139 01/23/2015 1026   NA 139 08/22/2014 0943   K 3.9 02/27/2015 1120   K 5.0 01/23/2015 1026   K 3.8 08/22/2014 0943   CL 102 01/23/2015 1026   CL 105 08/22/2014 0943   CO2 25 02/27/2015 1120   CO2 30 01/23/2015 1026   CO2 27 08/22/2014 0943   BUN 13.2 02/27/2015 1120   BUN 21 01/23/2015 1026   BUN 13 08/22/2014 0943   CREATININE 0.8 02/27/2015 1120   CREATININE 0.77 01/23/2015 1026   CREATININE 0.9 08/22/2014 0943      Component Value Date/Time   CALCIUM 9.2 02/27/2015 1120   CALCIUM 9.1 01/23/2015 1026   CALCIUM 8.9 08/22/2014 0943   ALKPHOS 105 02/27/2015 1120   ALKPHOS 110* 08/22/2014 0943   ALKPHOS 115 04/04/2014 1024   AST 24 02/27/2015 1120   AST 31 08/22/2014 0943   AST 41* 04/04/2014 1024   ALT 31 02/27/2015 1120   ALT 31 08/22/2014 0943   ALT 63* 04/04/2014 1024   BILITOT 0.43 02/27/2015 1120   BILITOT 0.80 08/22/2014 0943   BILITOT 0.5 04/04/2014 1024     Impression and Plan: Leah [name] is 55 yo white female with a metastatic neuroendocrine carcinoid. She continues to do well but does have occasional nausea. She is asymptomatic at this time.   She will continue her monthly Somatuline injections.   We will plan to see her back for labs and follow-up in 3 months.  She will contact us with any questions or concerns. We can certainly see her sooner if need be.   Eliezer Bottom, NP 4/18/201710:11 AM

## 2015-05-30 LAB — CHROMOGRANIN A: CHROMOGRAN A: 2 nmol/L (ref 0–5)

## 2015-06-01 LAB — CHROMOGRANIN A (PARALLEL TESTING): CHROMOGRANIN A: 9 ng/mL (ref <=15)

## 2015-07-03 ENCOUNTER — Ambulatory Visit (HOSPITAL_BASED_OUTPATIENT_CLINIC_OR_DEPARTMENT_OTHER): Payer: Managed Care, Other (non HMO)

## 2015-07-03 ENCOUNTER — Other Ambulatory Visit: Payer: Self-pay | Admitting: *Deleted

## 2015-07-03 VITALS — BP 145/99 | HR 102 | Temp 98.8°F | Resp 16

## 2015-07-03 DIAGNOSIS — C7A00 Malignant carcinoid tumor of unspecified site: Secondary | ICD-10-CM | POA: Diagnosis not present

## 2015-07-03 DIAGNOSIS — C7B8 Other secondary neuroendocrine tumors: Secondary | ICD-10-CM

## 2015-07-03 MED ORDER — OSPEMIFENE 60 MG PO TABS: 60.0000 mg | ORAL_TABLET | Freq: Every day | ORAL | Status: DC

## 2015-07-03 MED ORDER — LANREOTIDE ACETATE 120 MG/0.5ML ~~LOC~~ SOLN
120.0000 mg | Freq: Once | SUBCUTANEOUS | Status: AC
  Administered 2015-07-03: 120 mg via SUBCUTANEOUS
  Filled 2015-07-03: qty 120

## 2015-07-03 NOTE — Patient Instructions (Signed)
Lanreotide injection What is this medicine? LANREOTIDE (lan REE oh tide) is used to reduce blood levels of growth hormone in patients with a condition called acromegaly. It also works to slow or stop tumor growth in patients with gastroenteropancreatic neuroendocrine tumor (GEP-NET). This medicine may be used for other purposes; ask your health care provider or pharmacist if you have questions. What should I tell my health care provider before I take this medicine? They need to know if you have any of these conditions: -diabetes -gallbladder disease -heart disease -kidney disease -liver disease -an unusual or allergic reaction to lanreotide, other medicines, latex, foods, dyes, or preservatives -pregnant or trying to get pregnant -breast-feeding How should I use this medicine? This medicine is for injection under the skin. It is given by a health care professional in a hospital or clinic setting. Contact your pediatrician or health care professional regarding the use of this medicine in children. Special care may be needed. Overdosage: If you think you have taken too much of this medicine contact a poison control center or emergency room at once. NOTE: This medicine is only for you. Do not share this medicine with others. What if I miss a dose? It is important not to miss your dose. Call your doctor or health care professional if you are unable to keep an appointment. What may interact with this medicine? -bromocriptine -cyclosporine -medicines for diabetes, including insulin -medicines for heart disease or hypertension -quinidine This list may not describe all possible interactions. Give your health care provider a list of all the medicines, herbs, non-prescription drugs, or dietary supplements you use. Also tell them if you smoke, drink alcohol, or use illegal drugs. Some items may interact with your medicine. What should I watch for while using this medicine? Visit your doctor or  health care professional for regular checks on your progress. Your condition will be monitored carefully while you are receiving this medicine. This medicine may cause increases or decreases in blood sugar. Signs of high blood sugar include frequent urination, unusual thirst, flushed or dry skin, difficulty breathing, drowsiness, stomach ache, nausea, vomiting or dry mouth. Signs of low blood sugar include chills, cool, pale skin or cold sweats, drowsiness, extreme hunger, fast heartbeat, headache, nausea, nervousness or anxiety, shakiness, trembling, unsteadiness, tiredness, or weakness. Contact your doctor or health care professional right away if you experience any of these symptoms. What side effects may I notice from receiving this medicine? Side effects that you should report to your doctor or health care professional as soon as possible: -allergic reactions like skin rash, itching or hives, swelling of the face, lips, or tongue -changes in blood sugar -changes in heart rate -severe stomach pain Side effects that usually do not require medical attention (report to your doctor or health care professional if they continue or are bothersome): -diarrhea or constipation -gas or stomach pain -nausea, vomiting -pain, redness, swelling and irritation at site where injected This list may not describe all possible side effects. Call your doctor for medical advice about side effects. You may report side effects to FDA at 1-800-FDA-1088. Where should I keep my medicine? This drug is given in a hospital or clinic and will not be stored at home. NOTE: This sheet is a summary. It may not cover all possible information. If you have questions about this medicine, talk to your doctor, pharmacist, or health care provider.    2016, Elsevier/Gold Standard. (2013-01-26 17:43:04)

## 2015-07-08 ENCOUNTER — Encounter: Payer: Self-pay | Admitting: Family

## 2015-07-08 ENCOUNTER — Other Ambulatory Visit: Payer: Self-pay | Admitting: Family

## 2015-07-08 ENCOUNTER — Encounter: Payer: Self-pay | Admitting: Hematology & Oncology

## 2015-07-10 ENCOUNTER — Other Ambulatory Visit: Payer: Self-pay | Admitting: *Deleted

## 2015-07-10 MED ORDER — OSPEMIFENE 60 MG PO TABS: 60.0000 mg | ORAL_TABLET | Freq: Every day | ORAL | Status: DC

## 2015-07-17 ENCOUNTER — Telehealth: Payer: Self-pay | Admitting: Family

## 2015-07-17 NOTE — Telephone Encounter (Signed)
I spoke with Ms. Earnest's daughter in law Rosselyn (in response to her e-mail) and let her know that a prescription for Lomotil was sent to her pharmacy.  I explained in detail the function of Keytruda and how it works. All questions were answered and she verbalized understanding.  She will also follow-up with Dr. Lucia Gaskins regarding a second opinion for having the patient's fistula repaired and whether or not she would be a good candidate for an [facility].

## 2015-07-31 ENCOUNTER — Ambulatory Visit (HOSPITAL_BASED_OUTPATIENT_CLINIC_OR_DEPARTMENT_OTHER): Payer: Managed Care, Other (non HMO)

## 2015-07-31 VITALS — BP 153/93 | HR 97 | Temp 98.2°F | Resp 16

## 2015-07-31 DIAGNOSIS — C7A00 Malignant carcinoid tumor of unspecified site: Secondary | ICD-10-CM | POA: Diagnosis not present

## 2015-07-31 DIAGNOSIS — C7B8 Other secondary neuroendocrine tumors: Secondary | ICD-10-CM

## 2015-07-31 MED ORDER — LANREOTIDE ACETATE 120 MG/0.5ML ~~LOC~~ SOLN
120.0000 mg | Freq: Once | SUBCUTANEOUS | Status: AC
  Administered 2015-07-31: 120 mg via SUBCUTANEOUS
  Filled 2015-07-31: qty 120

## 2015-07-31 NOTE — Patient Instructions (Signed)
Lanreotide injection What is this medicine? LANREOTIDE (lan REE oh tide) is used to reduce blood levels of growth hormone in patients with a condition called acromegaly. It also works to slow or stop tumor growth in patients with gastroenteropancreatic neuroendocrine tumor (GEP-NET). This medicine may be used for other purposes; ask your health care provider or pharmacist if you have questions. What should I tell my health care provider before I take this medicine? They need to know if you have any of these conditions: -diabetes -gallbladder disease -heart disease -kidney disease -liver disease -an unusual or allergic reaction to lanreotide, other medicines, latex, foods, dyes, or preservatives -pregnant or trying to get pregnant -breast-feeding How should I use this medicine? This medicine is for injection under the skin. It is given by a health care professional in a hospital or clinic setting. Contact your pediatrician or health care professional regarding the use of this medicine in children. Special care may be needed. Overdosage: If you think you have taken too much of this medicine contact a poison control center or emergency room at once. NOTE: This medicine is only for you. Do not share this medicine with others. What if I miss a dose? It is important not to miss your dose. Call your doctor or health care professional if you are unable to keep an appointment. What may interact with this medicine? -bromocriptine -cyclosporine -medicines for diabetes, including insulin -medicines for heart disease or hypertension -quinidine This list may not describe all possible interactions. Give your health care provider a list of all the medicines, herbs, non-prescription drugs, or dietary supplements you use. Also tell them if you smoke, drink alcohol, or use illegal drugs. Some items may interact with your medicine. What should I watch for while using this medicine? Visit your doctor or  health care professional for regular checks on your progress. Your condition will be monitored carefully while you are receiving this medicine. This medicine may cause increases or decreases in blood sugar. Signs of high blood sugar include frequent urination, unusual thirst, flushed or dry skin, difficulty breathing, drowsiness, stomach ache, nausea, vomiting or dry mouth. Signs of low blood sugar include chills, cool, pale skin or cold sweats, drowsiness, extreme hunger, fast heartbeat, headache, nausea, nervousness or anxiety, shakiness, trembling, unsteadiness, tiredness, or weakness. Contact your doctor or health care professional right away if you experience any of these symptoms. What side effects may I notice from receiving this medicine? Side effects that you should report to your doctor or health care professional as soon as possible: -allergic reactions like skin rash, itching or hives, swelling of the face, lips, or tongue -changes in blood sugar -changes in heart rate -severe stomach pain Side effects that usually do not require medical attention (report to your doctor or health care professional if they continue or are bothersome): -diarrhea or constipation -gas or stomach pain -nausea, vomiting -pain, redness, swelling and irritation at site where injected This list may not describe all possible side effects. Call your doctor for medical advice about side effects. You may report side effects to FDA at 1-800-FDA-1088. Where should I keep my medicine? This drug is given in a hospital or clinic and will not be stored at home. NOTE: This sheet is a summary. It may not cover all possible information. If you have questions about this medicine, talk to your doctor, pharmacist, or health care provider.    2016, Elsevier/Gold Standard. (2013-01-26 17:43:04)

## 2015-08-02 ENCOUNTER — Other Ambulatory Visit: Payer: Self-pay | Admitting: Family

## 2015-08-02 NOTE — Telephone Encounter (Signed)
2 week supply of Venlafaxine, metoprolol, ondansetron and valsartan sent to pharmacy. Pt was due for 3 month follow up with Melissa and is past due. Please call pt to schedule appt soon and will do additional refills at that time. Thanks!

## 2015-08-07 NOTE — Telephone Encounter (Signed)
Pt has been scheduled.  °

## 2015-08-15 ENCOUNTER — Other Ambulatory Visit: Payer: Self-pay | Admitting: Family

## 2015-08-22 ENCOUNTER — Ambulatory Visit (INDEPENDENT_AMBULATORY_CARE_PROVIDER_SITE_OTHER): Payer: Managed Care, Other (non HMO) | Admitting: Family

## 2015-08-22 ENCOUNTER — Encounter: Payer: Self-pay | Admitting: Family

## 2015-08-22 VITALS — BP 110/80 | HR 112 | Temp 98.1°F | Ht 59.0 in | Wt 229.4 lb

## 2015-08-22 DIAGNOSIS — F419 Anxiety disorder, unspecified: Secondary | ICD-10-CM

## 2015-08-22 DIAGNOSIS — E1129 Type 2 diabetes mellitus with other diabetic kidney complication: Secondary | ICD-10-CM

## 2015-08-22 DIAGNOSIS — K219 Gastro-esophageal reflux disease without esophagitis: Secondary | ICD-10-CM | POA: Diagnosis not present

## 2015-08-22 DIAGNOSIS — I10 Essential (primary) hypertension: Secondary | ICD-10-CM

## 2015-08-22 LAB — BASIC METABOLIC PANEL
BUN: 14 mg/dL (ref 6–23)
CHLORIDE: 99 meq/L (ref 96–112)
CO2: 26 mEq/L (ref 19–32)
Calcium: 8.8 mg/dL (ref 8.4–10.5)
Creatinine, Ser: 0.68 mg/dL (ref 0.40–1.20)
GFR: 94.98 mL/min (ref >60.00)
GLUCOSE: 144 mg/dL — AB (ref 70–99)
POTASSIUM: 4.2 meq/L (ref 3.5–5.1)
SODIUM: 142 meq/L (ref 135–145)

## 2015-08-22 LAB — HEMOGLOBIN A1C: HEMOGLOBIN A1C: 6.7 % — AB (ref 4.6–6.5)

## 2015-08-22 MED ORDER — VENLAFAXINE HCL ER 150 MG PO CP24: ORAL_CAPSULE | ORAL | Status: DC

## 2015-08-22 MED ORDER — AMLODIPINE BESYLATE 10 MG PO TABS: 10.0000 mg | ORAL_TABLET | Freq: Every day | ORAL | Status: DC

## 2015-08-22 MED ORDER — VALSARTAN 320 MG PO TABS: 320.0000 mg | ORAL_TABLET | Freq: Every day | ORAL | Status: DC

## 2015-08-22 MED ORDER — METOPROLOL SUCCINATE ER 100 MG PO TB24: ORAL_TABLET | ORAL | Status: DC

## 2015-08-22 NOTE — Assessment & Plan Note (Signed)
Stable on effexor, continue same. 

## 2015-08-22 NOTE — Progress Notes (Signed)
Subjective:    Patient ID: Leah [name], female    DOB: May 03, 1960, 55 y.o.   MRN: [id number]  HPI  Leah [name] is a 55 yr old female who presents today for follow up.  HTN- maintained on diovan, toprol xl.   BP Readings from Last 3 Encounters:  08/22/15 110/80  07/31/15 153/93  07/03/15 145/99   DM2-  Lab Results  Component Value Date   HGBA1C 6.8* 01/23/2015   HGBA1C 6.6* 10/31/2014   HGBA1C 6.6* 03/24/2014   Lab Results  Component Value Date   MICROALBUR 9.0* 05/01/2014   LDLCALC 87 10/31/2014   CREATININE 0.8 05/29/2015   Anxiety- has a lot of stress at home with her children and her mother-in-law who is ill. She is maintained on effexor xr.    GERD- maintained on PPI.  Reports symptoms are well controlled.     Review of Systems    see HPI  Past Medical History  Diagnosis Date  . Sarcoidosis ([place]) 2008  . Allergic rhinitis   . GE reflux   . Neuroendocrine tumor 2011  . Surgical menopause   . Migraines   . Atrophic vaginitis   . STD (sexually transmitted disease)     HSV I  . Hepatic encephalopathy ([place]) 2011  . HTN (hypertension) 2011  . Carcinoid syndrome ([place])   . Borderline diabetes 03/17/2013  . Orthostatic hypotension   . Palpitations   . History of retinal vein occlusion     right eye     Social History   Social History  . Marital Status: Married    Spouse Name: N/A  . Number of Children: N/A  . Years of Education: N/A   Occupational History  . Not on file.   Social History Main Topics  . Smoking status: Never Smoker   . Smokeless tobacco: Never Used     Comment: never used tobacco  . Alcohol Use: No  . Drug Use: No  . Sexual Activity: Yes    Birth Control/ Protection: Surgical     Comment: hysterectomy   Other Topics Concern  . Not on file   Social History Narrative   Married to [place]   2 sons- 2001 Leah [name] and  Leah [name] (1999)   Has dog   She is unemployed, has worked in the past (office work)   Completed some college   Enjoys Theatre manager    Past Surgical History  Procedure Laterality Date  . Tonsillectomy  1976  . Cesarean section  [id number]  . Cholecystectomy  2006  . Abdominal hysterectomy  2004  . Oophorectomy  2004  . Mastectomy  2008    bilateral  . Bilateral mastectectomy Bilateral 2008  . Latissimus dorsi graft  2008    used for reconstructive breast surgery  . Hepatetetomy    . Tubal ligation  2001    BTL  . Breast surgery      bilateral masectomy and reconstruction with silicone implants  . Knee arthroscopy Left 09/2010    Family History  Problem Relation Age of Onset  . Arthritis Maternal Grandmother   . Arthritis Maternal Grandfather   . Arthritis Paternal Grandmother   . Arthritis Paternal Grandfather   . Heart failure Paternal Grandfather   . Breast cancer Mother 70    died at 22  . Lupus Mother   . Heart failure Father   . Hypertension Father   . Hyperlipidemia Father   . Breast cancer Maternal Aunt 38  deceased at 4 recurrence  . Ovarian cancer Maternal Aunt 46     died with in 53 months  . Breast cancer Maternal Aunt 40    died at age 94    No Known Allergies  Current Outpatient Prescriptions on File Prior to Visit  Medication Sig Dispense Refill  . amLODipine (NORVASC) 10 MG tablet Take 1 tablet (10 mg total) by mouth daily. 90 tablet 1  . aspirin 81 MG EC tablet Take 1 tablet (81 mg total) by mouth daily. 100 tablet 1  . [facility] ALLERGY RELIEF 180 MG tablet TAKE 1 TABLET BY MOUTH EVERY DAY 30 tablet 11  . fluticasone (FLONASE) 50 MCG/ACT nasal spray USE 2 SPRAYS IN BOTH NOSTRILS EVERY DAY 16 g 2  . metoprolol succinate (TOPROL-XL) 100 MG 24 hr tablet TAKE 1 TABLET BY MOUTH EVERY DAY WITH OR IMMEDIATELY FOLLOWING A MEAL 15 tablet 0  . Multiple Vitamin (MULTI VITAMIN DAILY PO) Take by mouth every morning.    . naproxen (NAPROSYN) 500 MG tablet Take 1 tablet by mouth 2 (two) times daily.    . ondansetron (ZOFRAN-ODT) 4 MG disintegrating tablet DISSOLVE 1  TABLET BY MOUTH EVERY 8 HOURS AS NEEDED FOR NAUSEA AND VOMITING 20 tablet 0  . Ospemifene (OSPHENA) 60 MG TABS Take 60 mg by mouth daily. 90 day supply 90 tablet 3  . pantoprazole (PROTONIX) 40 MG tablet TAKE 1 TABLET (40 MG TOTAL) BY MOUTH DAILY. 90 tablet 1  . PROVENTIL HFA 108 (90 BASE) MCG/ACT inhaler INHALE 2 PUFFS INTO THE LUNGS EVERY 6 (SIX) HOURS AS NEEDED. FOR SHORTNESS OF BREATH. 6.7 Inhaler 5  . valACYclovir (VALTREX) 500 MG tablet Take 500 mg by mouth 2 (two) times daily as needed. Cold sores    . valsartan (DIOVAN) 320 MG tablet TAKE 1 TABLET BY MOUTH EVERY DAY 15 tablet 0  . venlafaxine XR (EFFEXOR-XR) 150 MG 24 hr capsule TAKE 1 CAPSULE (150 MG TOTAL) BY MOUTH DAILY. 15 capsule 0   No current facility-administered medications on file prior to visit.    BP 110/80 mmHg  Pulse 112  Temp(Src) 98.1 F (36.7 C) (Oral)  Ht 4\' 11"  (1.499 m)  Wt 229 lb 6.4 oz (104.055 kg)  BMI 46.31 kg/m2  SpO2 98%    Objective:   Physical Exam  Constitutional: She appears well-developed and well-nourished.  Cardiovascular: Normal rate, regular rhythm and normal heart sounds.   No murmur heard. Pulmonary/Chest: Effort normal and breath sounds normal. No respiratory distress. She has no wheezes.  Psychiatric: She has a normal mood and affect. Her behavior is normal. Judgment and thought content normal.          Assessment & Plan:

## 2015-08-22 NOTE — Assessment & Plan Note (Signed)
Stable, continue diovan and toprol xl.

## 2015-08-22 NOTE — Patient Instructions (Addendum)
Please complete lab work prior to leaving. Please schedule a complete physical at the front desk.  

## 2015-08-22 NOTE — Assessment & Plan Note (Signed)
Stable clinically. Discussed diet, obtain A1C.

## 2015-08-22 NOTE — Progress Notes (Signed)
Pre visit review using our clinic review tool, if applicable. No additional management support is needed unless otherwise documented below in the visit note. 

## 2015-08-22 NOTE — Assessment & Plan Note (Signed)
Stable on PPI, continue same.  

## 2015-08-28 ENCOUNTER — Ambulatory Visit: Payer: Managed Care, Other (non HMO)

## 2015-08-28 ENCOUNTER — Other Ambulatory Visit: Payer: Managed Care, Other (non HMO)

## 2015-08-28 ENCOUNTER — Ambulatory Visit: Payer: Managed Care, Other (non HMO) | Admitting: Family

## 2015-09-05 ENCOUNTER — Ambulatory Visit (HOSPITAL_BASED_OUTPATIENT_CLINIC_OR_DEPARTMENT_OTHER): Payer: Managed Care, Other (non HMO) | Admitting: Family

## 2015-09-05 ENCOUNTER — Encounter: Payer: Self-pay | Admitting: Family

## 2015-09-05 ENCOUNTER — Other Ambulatory Visit (HOSPITAL_BASED_OUTPATIENT_CLINIC_OR_DEPARTMENT_OTHER): Payer: Managed Care, Other (non HMO)

## 2015-09-05 ENCOUNTER — Ambulatory Visit (HOSPITAL_BASED_OUTPATIENT_CLINIC_OR_DEPARTMENT_OTHER): Payer: Managed Care, Other (non HMO)

## 2015-09-05 VITALS — BP 150/89 | HR 119 | Temp 98.3°F | Resp 18 | Ht 59.0 in | Wt 228.0 lb

## 2015-09-05 DIAGNOSIS — C7B8 Other secondary neuroendocrine tumors: Secondary | ICD-10-CM

## 2015-09-05 DIAGNOSIS — C7A00 Malignant carcinoid tumor of unspecified site: Secondary | ICD-10-CM

## 2015-09-05 DIAGNOSIS — C787 Secondary malignant neoplasm of liver and intrahepatic bile duct: Secondary | ICD-10-CM

## 2015-09-05 DIAGNOSIS — E340 Carcinoid syndrome: Secondary | ICD-10-CM

## 2015-09-05 LAB — CMP (CANCER CENTER ONLY)
ALBUMIN: 3.3 g/dL (ref 3.3–5.5)
ALT(SGPT): 26 U/L (ref 10–47)
AST: 24 U/L (ref 11–38)
Alkaline Phosphatase: 84 U/L (ref 26–84)
BUN, Bld: 14 mg/dL (ref 7–22)
CALCIUM: 9.1 mg/dL (ref 8.0–10.3)
CHLORIDE: 105 meq/L (ref 98–108)
CO2: 26 mEq/L (ref 18–33)
CREATININE: 1 mg/dL (ref 0.6–1.2)
GLUCOSE: 125 mg/dL — AB (ref 73–118)
POTASSIUM: 3.8 meq/L (ref 3.3–4.7)
Sodium: 137 mEq/L (ref 128–145)
TOTAL PROTEIN: 7 g/dL (ref 6.4–8.1)
Total Bilirubin: 0.7 mg/dl (ref 0.20–1.60)

## 2015-09-05 LAB — CBC WITH DIFFERENTIAL (CANCER CENTER ONLY)
BASO#: 0.1 10*3/uL (ref 0.0–0.2)
BASO%: 0.5 % (ref 0.0–2.0)
EOS ABS: 0.2 10*3/uL (ref 0.0–0.5)
EOS%: 1.6 % (ref 0.0–7.0)
HCT: 39.9 % (ref 34.8–46.6)
HEMOGLOBIN: 13.2 g/dL (ref 11.6–15.9)
LYMPH#: 3.4 10*3/uL — ABNORMAL HIGH (ref 0.9–3.3)
LYMPH%: 28.8 % (ref 14.0–48.0)
MCH: 29.7 pg (ref 26.0–34.0)
MCHC: 33.1 g/dL (ref 32.0–36.0)
MCV: 90 fL (ref 81–101)
MONO#: 0.7 10*3/uL (ref 0.1–0.9)
MONO%: 6 % (ref 0.0–13.0)
NEUT%: 63.1 % (ref 39.6–80.0)
NEUTROS ABS: 7.4 10*3/uL — AB (ref 1.5–6.5)
Platelets: 273 10*3/uL (ref 145–400)
RBC: 4.44 10*6/uL (ref 3.70–5.32)
RDW: 14.2 % (ref 11.1–15.7)
WBC: 11.8 10*3/uL — ABNORMAL HIGH (ref 3.9–10.0)

## 2015-09-05 LAB — LACTATE DEHYDROGENASE: LDH: 187 U/L (ref 125–245)

## 2015-09-05 MED ORDER — LANREOTIDE ACETATE 120 MG/0.5ML ~~LOC~~ SOLN
120.0000 mg | Freq: Once | SUBCUTANEOUS | Status: AC
  Administered 2015-09-05: 120 mg via SUBCUTANEOUS
  Filled 2015-09-05: qty 120

## 2015-09-05 NOTE — Patient Instructions (Signed)
Lanreotide injection What is this medicine? LANREOTIDE (lan REE oh tide) is used to reduce blood levels of growth hormone in patients with a condition called acromegaly. It also works to slow or stop tumor growth in patients with gastroenteropancreatic neuroendocrine tumor (GEP-NET). This medicine may be used for other purposes; ask your health care provider or pharmacist if you have questions. What should I tell my health care provider before I take this medicine? They need to know if you have any of these conditions: -diabetes -gallbladder disease -heart disease -kidney disease -liver disease -an unusual or allergic reaction to lanreotide, other medicines, latex, foods, dyes, or preservatives -pregnant or trying to get pregnant -breast-feeding How should I use this medicine? This medicine is for injection under the skin. It is given by a health care professional in a hospital or clinic setting. Contact your pediatrician or health care professional regarding the use of this medicine in children. Special care may be needed. Overdosage: If you think you have taken too much of this medicine contact a poison control center or emergency room at once. NOTE: This medicine is only for you. Do not share this medicine with others. What if I miss a dose? It is important not to miss your dose. Call your doctor or health care professional if you are unable to keep an appointment. What may interact with this medicine? -bromocriptine -cyclosporine -medicines for diabetes, including insulin -medicines for heart disease or hypertension -quinidine This list may not describe all possible interactions. Give your health care provider a list of all the medicines, herbs, non-prescription drugs, or dietary supplements you use. Also tell them if you smoke, drink alcohol, or use illegal drugs. Some items may interact with your medicine. What should I watch for while using this medicine? Visit your doctor or  health care professional for regular checks on your progress. Your condition will be monitored carefully while you are receiving this medicine. This medicine may cause increases or decreases in blood sugar. Signs of high blood sugar include frequent urination, unusual thirst, flushed or dry skin, difficulty breathing, drowsiness, stomach ache, nausea, vomiting or dry mouth. Signs of low blood sugar include chills, cool, pale skin or cold sweats, drowsiness, extreme hunger, fast heartbeat, headache, nausea, nervousness or anxiety, shakiness, trembling, unsteadiness, tiredness, or weakness. Contact your doctor or health care professional right away if you experience any of these symptoms. What side effects may I notice from receiving this medicine? Side effects that you should report to your doctor or health care professional as soon as possible: -allergic reactions like skin rash, itching or hives, swelling of the face, lips, or tongue -changes in blood sugar -changes in heart rate -severe stomach pain Side effects that usually do not require medical attention (report to your doctor or health care professional if they continue or are bothersome): -diarrhea or constipation -gas or stomach pain -nausea, vomiting -pain, redness, swelling and irritation at site where injected This list may not describe all possible side effects. Call your doctor for medical advice about side effects. You may report side effects to FDA at 1-800-FDA-1088. Where should I keep my medicine? This drug is given in a hospital or clinic and will not be stored at home. NOTE: This sheet is a summary. It may not cover all possible information. If you have questions about this medicine, talk to your doctor, pharmacist, or health care provider.    2016, Elsevier/Gold Standard. (2013-01-26 17:43:04)

## 2015-09-05 NOTE — Progress Notes (Signed)
Hematology and Oncology Follow Up Visit  Leah [name] [id number] [birth year] 55 y.o. 09/05/2015   Principle Diagnosis:  Metastatic neuroendocrine carcinoma-carcinoid  Current Therapy:   Somatuline 120 mg subcutaneous every 4 weeks    Interim History:  Leah [name] is here today for a follow-up. She missed her Somatuline injection last week and states that she then developed n/v and abdominal discomfort. She also had one episode of palpitations last week.  Her chromogranin A level in April was 2.  No fever, chills, cough, rash, dizziness, headache, vision changes, SOB, chest pain or changes in bowel or bladder habits.  No swelling, numbness or tingling in her extremities. No new aches or c/o "bone" pain.  No lymphadenopathy found on exam. No episodes of bleeding or bruising.  She is eating well and staying hydrated. Her weight is stable.   Medications:    Medication List       Accurate as of 09/05/15  1:06 PM. Always use your most recent med list.          amLODipine 10 MG tablet Commonly known as:  NORVASC Take 1 tablet (10 mg total) by mouth daily.   aspirin 81 MG EC tablet Take 1 tablet (81 mg total) by mouth daily.   [facility] ALLERGY RELIEF 180 MG tablet Generic drug:  fexofenadine TAKE 1 TABLET BY MOUTH EVERY DAY   fluticasone 50 MCG/ACT nasal spray Commonly known as:  FLONASE USE 2 SPRAYS IN BOTH NOSTRILS EVERY DAY   metoprolol succinate 100 MG 24 hr tablet Commonly known as:  TOPROL-XL TAKE 1 TABLET BY MOUTH EVERY DAY WITH OR IMMEDIATELY FOLLOWING A MEAL   metroNIDAZOLE 0.75 % cream Commonly known as:  METROCREAM Apply topically.   MULTI VITAMIN DAILY PO Take by mouth every morning.   naproxen 500 MG tablet Commonly known as:  NAPROSYN Take 1 tablet by mouth 2 (two) times daily.   ondansetron 4 MG disintegrating tablet Commonly known as:  ZOFRAN-ODT DISSOLVE 1 TABLET BY MOUTH EVERY 8 HOURS AS NEEDED FOR NAUSEA AND VOMITING   Ospemifene 60 MG  Tabs Commonly known as:  OSPHENA Take 60 mg by mouth daily. 90 day supply   pantoprazole 40 MG tablet Commonly known as:  PROTONIX TAKE 1 TABLET (40 MG TOTAL) BY MOUTH DAILY.   PROVENTIL HFA 108 (90 Base) MCG/ACT inhaler Generic drug:  albuterol INHALE 2 PUFFS INTO THE LUNGS EVERY 6 (SIX) HOURS AS NEEDED. FOR SHORTNESS OF BREATH.   valACYclovir 500 MG tablet Commonly known as:  VALTREX Take 500 mg by mouth 2 (two) times daily as needed. Cold sores   valsartan 320 MG tablet Commonly known as:  DIOVAN Take 1 tablet (320 mg total) by mouth daily.   venlafaxine XR 150 MG 24 hr capsule Commonly known as:  EFFEXOR-XR TAKE 1 CAPSULE (150 MG TOTAL) BY MOUTH DAILY.       Allergies: No Known Allergies  Past Medical History, Surgical history, Social history, and Family History were reviewed and updated.  Review of Systems: All other 10 point review of systems is negative.   Physical Exam:  height is 4\' 11"  (1.499 m) and weight is 228 lb (103.4 kg). Her oral temperature is 98.3 F (36.8 C). Her blood pressure is 150/89 (abnormal) and her pulse is 119 (abnormal). Her respiration is 18.   Wt Readings from Last 3 Encounters:  09/05/15 228 lb (103.4 kg)  08/22/15 229 lb 6.4 oz (104.1 kg)  05/29/15 231 lb (104.8 kg)    Ocular:  Sclerae unicteric, pupils equal, round and reactive to light Ear-nose-throat: Oropharynx clear, dentition fair Lymphatic: No cervical supraclavicular or axillary adenopathy Lungs no rales or rhonchi, good excursion bilaterally Heart regular rate and rhythm, no murmur appreciated Abd soft, nontender, positive bowel sounds, no liver or spleen tip palpated on exam, no fluid wave MSK no focal spinal tenderness, no joint edema Neuro: non-focal, well-oriented, appropriate affect Breasts: Deferred  Lab Results  Component Value Date   WBC 11.8 (H) 09/05/2015   HGB 13.2 09/05/2015   HCT 39.9 09/05/2015   MCV 90 09/05/2015   PLT 273 09/05/2015   Lab Results   Component Value Date   FERRITIN 382 (H) 10/18/2008   IRON <10 (L) 10/18/2008   TIBC NOT CALC Not calculated due to Iron <10. 10/18/2008   UIBC 156 10/18/2008   IRONPCTSAT NOT CALC Not calculated due to Iron <10. 10/18/2008   Lab Results  Component Value Date   RETICCTPCT 0.9 10/18/2008   RBC 4.44 09/05/2015   No results found for: KPAFRELGTCHN, LAMBDASER, KAPLAMBRATIO No results found for: IGGSERUM, IGA, IGMSERUM No results found for: Odetta Pink, SPEI   Chemistry      Component Value Date/Time   NA 137 09/05/2015 1112   NA 139 02/27/2015 1120   K 3.8 09/05/2015 1112   K 3.9 02/27/2015 1120   CL 105 09/05/2015 1112   CO2 26 09/05/2015 1112   CO2 25 02/27/2015 1120   BUN 14 09/05/2015 1112   BUN 13.2 02/27/2015 1120   CREATININE 1.0 09/05/2015 1112   CREATININE 0.8 02/27/2015 1120      Component Value Date/Time   CALCIUM 9.1 09/05/2015 1112   CALCIUM 9.2 02/27/2015 1120   ALKPHOS 84 09/05/2015 1112   ALKPHOS 105 02/27/2015 1120   AST 24 09/05/2015 1112   AST 24 02/27/2015 1120   ALT 26 09/05/2015 1112   ALT 31 02/27/2015 1120   BILITOT 0.70 09/05/2015 1112   BILITOT 0.43 02/27/2015 1120     Impression and Plan: Leah [name] is 55 yo white female with a metastatic neuroendocrine carcinoid. She was doing well until she missed her Somatuline injection last week. She then had some n/v, abdominal discomfort and palpitations. She states that this has happened before when she missed her injection. She is feeling a little better today and is not currently nauseated.  Chromogranin A has been stable at 2. Level today is pending.  She will continue her monthly injections and we will plan to see her back for labs and follow-up in 3 months.  She will contact us with any questions or concerns. We can certainly see her sooner if need be.   Eliezer Bottom, NP 7/26/20171:06 PM

## 2015-09-06 LAB — CHROMOGRANIN A: CHROMOGRAN A: 2 nmol/L (ref 0–5)

## 2015-09-10 ENCOUNTER — Ambulatory Visit (HOSPITAL_BASED_OUTPATIENT_CLINIC_OR_DEPARTMENT_OTHER)
Admission: RE | Admit: 2015-09-10 | Discharge: 2015-09-10 | Disposition: A | Discharge status: Home / Self Care | Payer: Managed Care, Other (non HMO) | Source: Ambulatory Visit | Attending: Family | Admitting: Family

## 2015-09-10 ENCOUNTER — Encounter: Payer: Self-pay | Admitting: Family

## 2015-09-10 ENCOUNTER — Ambulatory Visit (INDEPENDENT_AMBULATORY_CARE_PROVIDER_SITE_OTHER): Payer: Managed Care, Other (non HMO) | Admitting: Family

## 2015-09-10 VITALS — BP 138/78 | HR 113 | Temp 98.6°F | Resp 18 | Ht 59.0 in | Wt 226.6 lb

## 2015-09-10 DIAGNOSIS — J209 Acute bronchitis, unspecified: Secondary | ICD-10-CM

## 2015-09-10 DIAGNOSIS — R05 Cough: Secondary | ICD-10-CM | POA: Diagnosis present

## 2015-09-10 DIAGNOSIS — R059 Cough, unspecified: Secondary | ICD-10-CM

## 2015-09-10 MED ORDER — AZITHROMYCIN 250 MG PO TABS: ORAL_TABLET | ORAL | 0 refills | Status: DC

## 2015-09-10 MED ORDER — BENZONATATE 100 MG PO CAPS: 100.0000 mg | ORAL_CAPSULE | Freq: Three times a day (TID) | ORAL | 0 refills | Status: DC | PRN

## 2015-09-10 NOTE — Progress Notes (Signed)
Subjective:    Patient ID: Leah [name], female    DOB: 09/14/1960, 55 y.o.   MRN: [id number]  HPI  Reports that cough began 1 week ago, but worsened wed/thursday of last week. Now has lost her voice.  Over the weekend she had fevers (subjective).  Reports that she had some ear pain initially but this has resolved. Denies sore throat.  She is having some sinus drainage.  Reports some right sided back pain. She is tolerating PO's/    Review of Systems    see HPI  Past Medical History:  Diagnosis Date  . Allergic rhinitis   . Atrophic vaginitis   . Borderline diabetes 03/17/2013  . Carcinoid syndrome ([place])   . GE reflux   . Hepatic encephalopathy ([place]) 2011  . History of retinal vein occlusion    right eye  . HTN (hypertension) 2011  . Migraines   . Neuroendocrine tumor 2011  . Orthostatic hypotension   . Palpitations   . Sarcoidosis ([place]) 2008  . STD (sexually transmitted disease)    HSV I  . Surgical menopause      Social History   Social History  . Marital status: Married    Spouse name: N/A  . Number of children: N/A  . Years of education: N/A   Occupational History  . Not on file.   Social History Main Topics  . Smoking status: Never Smoker  . Smokeless tobacco: Never Used     Comment: never used tobacco  . Alcohol use No  . Drug use: No  . Sexual activity: Yes    Birth control/ protection: Surgical     Comment: hysterectomy   Other Topics Concern  . Not on file   Social History Narrative   Married to [place]   2 sons- 2001 [name] (1999)   Has dog   She is unemployed, has worked in the past (office work)   Completed some college   Enjoys Theatre manager    Past Surgical History:  Procedure Laterality Date  . ABDOMINAL HYSTERECTOMY  2004  . bilateral mastectectomy Bilateral 2008  . BREAST SURGERY     bilateral masectomy and reconstruction with silicone implants  . CESAREAN SECTION  [id number]  . CHOLECYSTECTOMY  2006  .  hepatetetomy    . KNEE ARTHROSCOPY Left 09/2010  . latissimus dorsi graft  2008   used for reconstructive breast surgery  . MASTECTOMY  2008   bilateral  . OOPHORECTOMY  2004  . TONSILLECTOMY  1976  . TUBAL LIGATION  2001   BTL    Family History  Problem Relation Age of Onset  . Arthritis Maternal Grandmother   . Arthritis Maternal Grandfather   . Arthritis Paternal Grandmother   . Arthritis Paternal Grandfather   . Heart failure Paternal Grandfather   . Breast cancer Mother 91    died at 52  . Lupus Mother   . Heart failure Father   . Hypertension Father   . Hyperlipidemia Father   . Breast cancer Maternal Aunt 38    deceased at 18 recurrence  . Ovarian cancer Maternal Aunt 56     died with in 67 months  . Breast cancer Maternal Aunt 40    died at age 83    No Known Allergies  Current Outpatient Prescriptions on File Prior to Visit  Medication Sig Dispense Refill  . amLODipine (NORVASC) 10 MG tablet Take 1 tablet (10 mg total) by mouth daily.  90 tablet 1  . aspirin 81 MG EC tablet Take 1 tablet (81 mg total) by mouth daily. 100 tablet 1  . [facility] ALLERGY RELIEF 180 MG tablet TAKE 1 TABLET BY MOUTH EVERY DAY 30 tablet 11  . fluticasone (FLONASE) 50 MCG/ACT nasal spray USE 2 SPRAYS IN BOTH NOSTRILS EVERY DAY 16 g 2  . metoprolol succinate (TOPROL-XL) 100 MG 24 hr tablet TAKE 1 TABLET BY MOUTH EVERY DAY WITH OR IMMEDIATELY FOLLOWING A MEAL 90 tablet 1  . metroNIDAZOLE (METROCREAM) 0.75 % cream Apply topically.    . Multiple Vitamin (MULTI VITAMIN DAILY PO) Take by mouth every morning.    . naproxen (NAPROSYN) 500 MG tablet Take 1 tablet by mouth 2 (two) times daily.    . ondansetron (ZOFRAN-ODT) 4 MG disintegrating tablet DISSOLVE 1 TABLET BY MOUTH EVERY 8 HOURS AS NEEDED FOR NAUSEA AND VOMITING 20 tablet 0  . Ospemifene (OSPHENA) 60 MG TABS Take 60 mg by mouth daily. 90 day supply 90 tablet 3  . pantoprazole (PROTONIX) 40 MG tablet TAKE 1 TABLET (40 MG TOTAL) BY MOUTH DAILY.  90 tablet 1  . PROVENTIL HFA 108 (90 BASE) MCG/ACT inhaler INHALE 2 PUFFS INTO THE LUNGS EVERY 6 (SIX) HOURS AS NEEDED. FOR SHORTNESS OF BREATH. 6.7 Inhaler 5  . valACYclovir (VALTREX) 500 MG tablet Take 500 mg by mouth 2 (two) times daily as needed. Cold sores    . valsartan (DIOVAN) 320 MG tablet Take 1 tablet (320 mg total) by mouth daily. 90 tablet 1  . venlafaxine XR (EFFEXOR-XR) 150 MG 24 hr capsule TAKE 1 CAPSULE (150 MG TOTAL) BY MOUTH DAILY. 90 capsule 1   No current facility-administered medications on file prior to visit.     BP 138/78   Pulse (!) 113   Temp 98.6 F (37 C) (Oral)   Resp 18   Ht 4\' 11"  (1.499 m)   Wt 226 lb 9.6 oz (102.8 kg)   SpO2 97% Comment: room air  BMI 45.77 kg/m    Objective:   Physical Exam  Constitutional: She is oriented to person, place, and time. She appears well-developed and well-nourished.  HENT:  Head: Normocephalic and atraumatic.  Mouth/Throat: No oropharyngeal exudate, posterior oropharyngeal edema or posterior oropharyngeal erythema.  Bilateral TM's occluded by cerumen.   Cardiovascular: Regular rhythm and normal heart sounds.   No murmur heard. Mild tachycardia is noted  Pulmonary/Chest: Effort normal and breath sounds normal. No respiratory distress. She has no wheezes.  Neurological: She is alert and oriented to person, place, and time.  Psychiatric: She has a normal mood and affect. Her behavior is normal. Judgment and thought content normal.          Assessment & Plan:  Bronchitis-  CXR is performed and is negative for infiltrate. Given mild tachycardia, and hx of metastatic neuroendocrine tumor, will go ahead and rx empirically with zpak for bronchitis. Rx sent for tessalon perls. She is advised to call if new/worsening symptoms, fever >101 or if symptoms do not improve in 2-3 days. Pt verbalizes understanding.

## 2015-09-10 NOTE — Patient Instructions (Signed)
Please complete chest x ray on the first floor. Begin zpak for bronchitis. Continue tessalon as needed for cough. Call if new/worsening symptoms, fever >101 or if not improved in 2-3 days.

## 2015-09-10 NOTE — Progress Notes (Signed)
Pre visit review using our clinic review tool, if applicable. No additional management support is needed unless otherwise documented below in the visit note. 

## 2015-09-16 ENCOUNTER — Other Ambulatory Visit: Payer: Self-pay | Admitting: Family

## 2015-09-19 ENCOUNTER — Encounter: Payer: Self-pay | Admitting: Family

## 2015-09-19 ENCOUNTER — Ambulatory Visit (INDEPENDENT_AMBULATORY_CARE_PROVIDER_SITE_OTHER): Payer: Managed Care, Other (non HMO) | Admitting: Family

## 2015-09-19 VITALS — BP 174/92 | HR 97 | Temp 99.1°F | Ht 59.25 in | Wt 226.4 lb

## 2015-09-19 DIAGNOSIS — Z0000 Encounter for general adult medical examination without abnormal findings: Secondary | ICD-10-CM | POA: Diagnosis not present

## 2015-09-19 DIAGNOSIS — M170 Bilateral primary osteoarthritis of knee: Secondary | ICD-10-CM

## 2015-09-19 DIAGNOSIS — R05 Cough: Secondary | ICD-10-CM

## 2015-09-19 DIAGNOSIS — R059 Cough, unspecified: Secondary | ICD-10-CM

## 2015-09-19 DIAGNOSIS — E2839 Other primary ovarian failure: Secondary | ICD-10-CM

## 2015-09-19 DIAGNOSIS — R030 Elevated blood-pressure reading, without diagnosis of hypertension: Secondary | ICD-10-CM

## 2015-09-19 DIAGNOSIS — IMO0001 Reserved for inherently not codable concepts without codable children: Secondary | ICD-10-CM

## 2015-09-19 LAB — URINALYSIS, ROUTINE W REFLEX MICROSCOPIC
Bilirubin Urine: NEGATIVE
Hgb urine dipstick: NEGATIVE
KETONES UR: NEGATIVE
LEUKOCYTES UA: NEGATIVE
Nitrite: NEGATIVE
PH: 5.5 (ref 5.0–8.0)
Specific Gravity, Urine: >=1.03 — AB (ref 1.000–1.030)
Total Protein, Urine: NEGATIVE
URINE GLUCOSE: NEGATIVE
Urobilinogen, UA: 0.2 (ref 0.0–1.0)

## 2015-09-19 LAB — BASIC METABOLIC PANEL
BUN: 16 mg/dL (ref 6–23)
CHLORIDE: 104 meq/L (ref 96–112)
CO2: 30 mEq/L (ref 19–32)
Calcium: 9.2 mg/dL (ref 8.4–10.5)
Creatinine, Ser: 0.77 mg/dL (ref 0.40–1.20)
GFR: 82.26 mL/min (ref >60.00)
Glucose, Bld: 134 mg/dL — ABNORMAL HIGH (ref 70–99)
POTASSIUM: 4.5 meq/L (ref 3.5–5.1)
SODIUM: 141 meq/L (ref 135–145)

## 2015-09-19 LAB — LIPID PANEL
CHOL/HDL RATIO: 4
Cholesterol: 209 mg/dL — ABNORMAL HIGH (ref 0–200)
HDL: 59.3 mg/dL (ref >39.00)
LDL CALC: 122 mg/dL — AB (ref 0–99)
NONHDL: 149.92
Triglycerides: 139 mg/dL (ref 0.0–149.0)
VLDL: 27.8 mg/dL (ref 0.0–40.0)

## 2015-09-19 LAB — HEPATIC FUNCTION PANEL
ALK PHOS: 95 U/L (ref 39–117)
ALT: 18 U/L (ref 0–35)
AST: 18 U/L (ref 0–37)
Albumin: 3.9 g/dL (ref 3.5–5.2)
BILIRUBIN DIRECT: 0.1 mg/dL (ref 0.0–0.3)
BILIRUBIN TOTAL: 0.6 mg/dL (ref 0.2–1.2)
Total Protein: 7.4 g/dL (ref 6.0–8.3)

## 2015-09-19 LAB — CBC WITH DIFFERENTIAL/PLATELET
BASOS ABS: 0.1 10*3/uL (ref 0.0–0.1)
Basophils Relative: 0.5 % (ref 0.0–3.0)
EOS PCT: 1.7 % (ref 0.0–5.0)
Eosinophils Absolute: 0.2 10*3/uL (ref 0.0–0.7)
HCT: 40.2 % (ref 36.0–46.0)
Hemoglobin: 13.5 g/dL (ref 12.0–15.0)
LYMPHS PCT: 30.6 % (ref 12.0–46.0)
Lymphs Abs: 3.3 10*3/uL (ref 0.7–4.0)
MCHC: 33.6 g/dL (ref 30.0–36.0)
MCV: 86.7 fl (ref 78.0–100.0)
Monocytes Absolute: 0.6 10*3/uL (ref 0.1–1.0)
Monocytes Relative: 5.9 % (ref 3.0–12.0)
NEUTROS PCT: 61.3 % (ref 43.0–77.0)
Neutro Abs: 6.6 10*3/uL (ref 1.4–7.7)
PLATELETS: 308 10*3/uL (ref 150.0–400.0)
RBC: 4.64 Mil/uL (ref 3.87–5.11)
RDW: 15.1 % (ref 11.5–15.5)
WBC: 10.8 10*3/uL — AB (ref 4.0–10.5)

## 2015-09-19 LAB — TSH: TSH: 2.61 u[IU]/mL (ref 0.35–4.50)

## 2015-09-19 MED ORDER — BENZONATATE 100 MG PO CAPS: 100.0000 mg | ORAL_CAPSULE | Freq: Three times a day (TID) | ORAL | 0 refills | Status: DC | PRN

## 2015-09-19 MED ORDER — FEXOFENADINE HCL 180 MG PO TABS: 180.0000 mg | ORAL_TABLET | Freq: Every day | ORAL | 11 refills | Status: DC

## 2015-09-19 MED ORDER — MONTELUKAST SODIUM 10 MG PO TABS: 10.0000 mg | ORAL_TABLET | Freq: Every day | ORAL | 3 refills | Status: DC

## 2015-09-19 MED ORDER — MELOXICAM 7.5 MG PO TABS: 7.5000 mg | ORAL_TABLET | Freq: Every day | ORAL | 0 refills | Status: DC

## 2015-09-19 NOTE — Patient Instructions (Addendum)
Schedule nurse visit in 2 weeks to recheck blood pressure. Complete lab work prior to leaving.  Restart singulair. Continue tessalon as needed for cough. Call if cough worsens or if not improved in the next week or two.  Continue to work on [employer], exercise, and weight loss. You will be contacted about your bone density.

## 2015-09-19 NOTE — Assessment & Plan Note (Signed)
Discussed healthy diet, exercise, weight loss.  Refer for dexa.

## 2015-09-19 NOTE — Progress Notes (Signed)
Subjective:    Patient ID: Leah [name], female    DOB: January 04, 1961, 55 y.o.   MRN: [id number]  HPI  Patient presents today for complete physical.  Immunizations: up to date Diet:  fair Exercise: water aerobics  Colonoscopy: 2010- reports that this was normal.   Dexa: due Pap Smear:  hysterectomy Mammogram:  mastectomy  Bronchitis- completed zpak.  Still has some drainage and cough. She is using flonase and allegra.  Stopped singulair.   Osteoarthritis- knees using otc naproxen 440mg  bid.     Review of Systems  Constitutional: Negative for unexpected weight change.  HENT: Positive for rhinorrhea. Negative for hearing loss.   Eyes: Negative for visual disturbance.  Respiratory: Positive for cough. Negative for shortness of breath.   Cardiovascular: Negative for chest pain and leg swelling.  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  Musculoskeletal: Positive for arthralgias.  Skin: Negative for rash.  Neurological: Negative for headaches.  Hematological: Negative for adenopathy.  Psychiatric/Behavioral:       Denies depression/anxiety       Past Medical History:  Diagnosis Date  . Allergic rhinitis   . Atrophic vaginitis   . Borderline diabetes 03/17/2013  . 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 GE reflux   . Hepatic encephalopathy ([place]) 2011  . History of retinal vein occlusion    right eye  . HTN (hypertension) 2011  . Migraines   . Neuroendocrine tumor 2011  . Orthostatic hypotension   . Palpitations   . Sarcoidosis ([place]) 2008  . STD (sexually transmitted disease)    HSV I  . Surgical menopause      Social History   Social History  . Marital status: Married    Spouse name: N/A  . Number of children: N/A  . Years of education: N/A   Occupational History  . Not on file.   Social History Main Topics  . Smoking status: Never Smoker  .  Smokeless tobacco: Never Used     Comment: never used tobacco  . Alcohol use No  . Drug use: No  . Sexual activity: Yes    Birth control/ protection: Surgical     Comment: hysterectomy   Other Topics Concern  . Not on file   Social History Narrative   Married to [place]   2 sons- 2001 [name] (1999)   Has dog   She is unemployed, has worked in the past (office work)   Completed some college   Enjoys Theatre manager    Past Surgical History:  Procedure Laterality Date  . ABDOMINAL HYSTERECTOMY  2004  . bilateral mastectectomy Bilateral 2008  . BREAST SURGERY     bilateral masectomy and reconstruction with silicone implants  . CESAREAN SECTION  [id number]  . CHOLECYSTECTOMY  2006  . hepatetetomy    . KNEE ARTHROSCOPY Left 09/2010  . latissimus dorsi graft  2008   used for reconstructive breast surgery  . MASTECTOMY  2008   bilateral  . OOPHORECTOMY  2004  . TONSILLECTOMY  1976  . TUBAL LIGATION  2001   BTL    Family History  Problem Relation Age of Onset  . Arthritis Maternal Grandmother   . Arthritis Maternal Grandfather   . Arthritis Paternal Grandmother   . Arthritis Paternal Grandfather   . Heart failure Paternal Grandfather   . Breast cancer Mother 60  died at 20  . Lupus Mother   . Heart failure Father   . Hypertension Father   . Hyperlipidemia Father   . Breast cancer Maternal Aunt 38    deceased at 54 recurrence  . Ovarian cancer Maternal Aunt 62     died with in 38 months  . Breast cancer Maternal Aunt 40    died at age 20    No Known Allergies  Current Outpatient Prescriptions on File Prior to Visit  Medication Sig Dispense Refill  . amLODipine (NORVASC) 10 MG tablet Take 1 tablet (10 mg total) by mouth daily. 90 tablet 1  . aspirin 81 MG EC tablet Take 1 tablet (81 mg total) by mouth daily. 100 tablet 1  . [facility] ALLERGY RELIEF 180 MG tablet TAKE 1 TABLET BY MOUTH EVERY DAY 30 tablet 11  . fluticasone (FLONASE) 50 MCG/ACT nasal  spray USE 2 SPRAYS IN BOTH NOSTRILS EVERY DAY 16 g 2  . metoprolol succinate (TOPROL-XL) 100 MG 24 hr tablet TAKE 1 TABLET BY MOUTH EVERY DAY WITH OR IMMEDIATELY FOLLOWING A MEAL 90 tablet 1  . metroNIDAZOLE (METROCREAM) 0.75 % cream Apply topically.    . Multiple Vitamin (MULTI VITAMIN DAILY PO) Take by mouth every morning.    . ondansetron (ZOFRAN-ODT) 4 MG disintegrating tablet Take 1 tablet (4 mg total) by mouth every 8 (eight) hours as needed for nausea or vomiting. 20 tablet 0  . Ospemifene (OSPHENA) 60 MG TABS Take 60 mg by mouth daily. 90 day supply 90 tablet 3  . pantoprazole (PROTONIX) 40 MG tablet TAKE 1 TABLET (40 MG TOTAL) BY MOUTH DAILY. 90 tablet 1  . PROVENTIL HFA 108 (90 BASE) MCG/ACT inhaler INHALE 2 PUFFS INTO THE LUNGS EVERY 6 (SIX) HOURS AS NEEDED. FOR SHORTNESS OF BREATH. 6.7 Inhaler 5  . valACYclovir (VALTREX) 500 MG tablet Take 500 mg by mouth 2 (two) times daily as needed. Cold sores    . valsartan (DIOVAN) 320 MG tablet Take 1 tablet (320 mg total) by mouth daily. 90 tablet 1  . venlafaxine XR (EFFEXOR-XR) 150 MG 24 hr capsule TAKE 1 CAPSULE (150 MG TOTAL) BY MOUTH DAILY. 90 capsule 1   No current facility-administered medications on file prior to visit.     BP (!) 115/92 (BP Location: Left Arm)   Pulse 97   Temp 99.1 F (37.3 C) (Oral)   Ht 4' 11.25" (1.505 m)   Wt 226 lb 6.4 oz (102.7 kg)   SpO2 97% Comment: room air  BMI 45.34 kg/m    Objective:   Physical Exam Physical Exam  Constitutional: She is oriented to person, place, and time. She appears well-developed and well-nourished. No distress.  HENT:  Head: Normocephalic and atraumatic.  Right Ear: Tympanic membrane and ear canal normal.  Left Ear: Tympanic membrane and ear canal normal.  Mouth/Throat: Oropharynx is clear and moist.  Eyes: Pupils are equal, round, and reactive to light. No scleral icterus.  Neck: Normal range of motion. No thyromegaly present.  Cardiovascular: Normal rate and regular  rhythm.   No murmur heard. Pulmonary/Chest: Effort normal and breath sounds normal. No respiratory distress. He has no wheezes. She has no rales. She exhibits no tenderness.  Abdominal: Soft. Bowel sounds are normal. She exhibits no distension and no mass. There is no tenderness. There is no rebound and no guarding.  Musculoskeletal: She exhibits no edema.  Lymphadenopathy:    She has no cervical adenopathy.  Neurological: She is alert and oriented to  person, place, and time. She has normal patellar reflexes. She exhibits normal muscle tone. Coordination normal.  Skin: Skin is warm and dry.  Psychiatric: She has a normal mood and affect. Her behavior is normal. Judgment and thought content normal.  Breasts: Examined lying, reconstructed breasts with scarring noted over incisions.          Assessment & Plan:          Assessment & Plan:  EKG tracing is personally reviewed.  EKG notes NSR.  No acute changes.   Osteoarthritis- d/c aleve, begin meloxicam prn.    Cough- likely due to allergic rhinitis and recent bronchitis.   Resume singulair, prn tessalon.   Elevated BP- will plan repeat BP in 1 month. Historically has not been so high.   BP Readings from Last 3 Encounters:  09/19/15 (!) 174/92  09/10/15 138/78  09/05/15 (!) 150/89

## 2015-09-19 NOTE — Progress Notes (Signed)
Pre visit review using our clinic review tool, if applicable. No additional management support is needed unless otherwise documented below in the visit note. 

## 2015-09-20 ENCOUNTER — Encounter: Payer: Self-pay | Admitting: Family

## 2015-09-23 ENCOUNTER — Telehealth: Payer: Self-pay | Admitting: Family

## 2015-09-23 DIAGNOSIS — N6320 Unspecified lump in the left breast, unspecified quadrant: Secondary | ICD-10-CM

## 2015-09-23 NOTE — Telephone Encounter (Signed)
See my chart message

## 2015-09-25 ENCOUNTER — Ambulatory Visit (HOSPITAL_BASED_OUTPATIENT_CLINIC_OR_DEPARTMENT_OTHER)
Admission: RE | Admit: 2015-09-25 | Discharge: 2015-09-25 | Disposition: A | Discharge status: Home / Self Care | Payer: Managed Care, Other (non HMO) | Source: Ambulatory Visit | Attending: Family | Admitting: Family

## 2015-09-25 ENCOUNTER — Ambulatory Visit: Payer: Managed Care, Other (non HMO)

## 2015-09-25 DIAGNOSIS — M8589 Other specified disorders of bone density and structure, multiple sites: Secondary | ICD-10-CM | POA: Insufficient documentation

## 2015-09-25 DIAGNOSIS — E2839 Other primary ovarian failure: Secondary | ICD-10-CM | POA: Diagnosis present

## 2015-09-27 ENCOUNTER — Telehealth: Payer: Self-pay | Admitting: Family

## 2015-09-27 DIAGNOSIS — M8580 Other specified disorders of bone density and structure, unspecified site: Secondary | ICD-10-CM

## 2015-09-27 NOTE — Telephone Encounter (Signed)
See mychart.  

## 2015-10-03 ENCOUNTER — Telehealth: Payer: Self-pay | Admitting: Family

## 2015-10-03 DIAGNOSIS — N63 Unspecified lump in unspecified breast: Secondary | ICD-10-CM

## 2015-10-03 NOTE — Telephone Encounter (Signed)
Caller name:Kathy Relationship to patient: Can be reached:[phone] Pharmacy:  Reason for call:Order for MRI Bil Breast needs to be with and without contrast. Can order be changed

## 2015-10-03 NOTE — Telephone Encounter (Signed)
Order entered as requested below. Notified Juliann Pulse at the [facility] and she confirmed order.

## 2015-10-09 ENCOUNTER — Ambulatory Visit (HOSPITAL_BASED_OUTPATIENT_CLINIC_OR_DEPARTMENT_OTHER): Payer: Managed Care, Other (non HMO)

## 2015-10-09 VITALS — BP 149/92 | HR 77 | Temp 98.1°F

## 2015-10-09 DIAGNOSIS — C7A00 Malignant carcinoid tumor of unspecified site: Secondary | ICD-10-CM

## 2015-10-09 DIAGNOSIS — C7B8 Other secondary neuroendocrine tumors: Secondary | ICD-10-CM

## 2015-10-09 MED ORDER — LANREOTIDE ACETATE 120 MG/0.5ML ~~LOC~~ SOLN
120.0000 mg | Freq: Once | SUBCUTANEOUS | Status: AC
  Administered 2015-10-09: 120 mg via SUBCUTANEOUS
  Filled 2015-10-09: qty 120

## 2015-10-09 NOTE — Patient Instructions (Signed)

## 2015-10-15 ENCOUNTER — Encounter: Payer: Self-pay | Admitting: Family

## 2015-10-16 MED ORDER — MONTELUKAST SODIUM 10 MG PO TABS: 10.0000 mg | ORAL_TABLET | Freq: Every day | ORAL | 1 refills | Status: DC

## 2015-10-20 ENCOUNTER — Encounter: Payer: Self-pay | Admitting: Family

## 2015-10-22 MED ORDER — FEXOFENADINE HCL 180 MG PO TABS: 180.0000 mg | ORAL_TABLET | Freq: Every day | ORAL | 3 refills | Status: DC

## 2015-10-22 NOTE — Telephone Encounter (Signed)
Previous Rx for #30 x 11 refills. Rx resent for #90 x 3 refills.

## 2015-10-23 ENCOUNTER — Ambulatory Visit: Payer: Managed Care, Other (non HMO)

## 2015-10-23 ENCOUNTER — Other Ambulatory Visit: Payer: Managed Care, Other (non HMO)

## 2015-10-24 ENCOUNTER — Other Ambulatory Visit: Payer: Managed Care, Other (non HMO)

## 2015-10-29 DIAGNOSIS — K432 Incisional hernia without obstruction or gangrene: Secondary | ICD-10-CM | POA: Insufficient documentation

## 2015-11-06 ENCOUNTER — Ambulatory Visit (HOSPITAL_BASED_OUTPATIENT_CLINIC_OR_DEPARTMENT_OTHER): Payer: Managed Care, Other (non HMO)

## 2015-11-06 ENCOUNTER — Telehealth: Payer: Self-pay | Admitting: Family

## 2015-11-06 ENCOUNTER — Ambulatory Visit
Admission: RE | Admit: 2015-11-06 | Discharge: 2015-11-06 | Disposition: A | Discharge status: Home / Self Care | Payer: Managed Care, Other (non HMO) | Source: Ambulatory Visit | Attending: Family | Admitting: Family

## 2015-11-06 VITALS — BP 143/79 | HR 80 | Temp 98.4°F | Resp 14

## 2015-11-06 DIAGNOSIS — Z23 Encounter for immunization: Secondary | ICD-10-CM

## 2015-11-06 DIAGNOSIS — C7A00 Malignant carcinoid tumor of unspecified site: Secondary | ICD-10-CM

## 2015-11-06 DIAGNOSIS — N6320 Unspecified lump in the left breast, unspecified quadrant: Secondary | ICD-10-CM

## 2015-11-06 DIAGNOSIS — N63 Unspecified lump in unspecified breast: Secondary | ICD-10-CM

## 2015-11-06 DIAGNOSIS — C7B8 Other secondary neuroendocrine tumors: Secondary | ICD-10-CM

## 2015-11-06 MED ORDER — LANREOTIDE ACETATE 120 MG/0.5ML ~~LOC~~ SOLN
120.0000 mg | Freq: Once | SUBCUTANEOUS | Status: AC
  Administered 2015-11-06: 120 mg via SUBCUTANEOUS

## 2015-11-06 MED ORDER — LANREOTIDE ACETATE 120 MG/0.5ML ~~LOC~~ SOLN
SUBCUTANEOUS | Status: AC
  Filled 2015-11-06: qty 120

## 2015-11-06 MED ORDER — GADOBENATE DIMEGLUMINE 529 MG/ML IV SOLN
18.0000 mL | Freq: Once | INTRAVENOUS | Status: AC | PRN
  Administered 2015-11-06: 18 mL via INTRAVENOUS

## 2015-11-06 MED ORDER — INFLUENZA VAC SPLIT QUAD 0.5 ML IM SUSY
0.5000 mL | PREFILLED_SYRINGE | Freq: Once | INTRAMUSCULAR | Status: AC
  Administered 2015-11-06: 0.5 mL via INTRAMUSCULAR
  Filled 2015-11-06: qty 0.5

## 2015-11-06 NOTE — Patient Instructions (Signed)
Lanreotide injection What is this medicine? LANREOTIDE (lan REE oh tide) is used to reduce blood levels of growth hormone in patients with a condition called acromegaly. It also works to slow or stop tumor growth in patients with gastroenteropancreatic neuroendocrine tumor (GEP-NET). This medicine may be used for other purposes; ask your health care provider or pharmacist if you have questions. What should I tell my health care provider before I take this medicine? They need to know if you have any of these conditions: -diabetes -gallbladder disease -heart disease -kidney disease -liver disease -an unusual or allergic reaction to lanreotide, other medicines, latex, foods, dyes, or preservatives -pregnant or trying to get pregnant -breast-feeding How should I use this medicine? This medicine is for injection under the skin. It is given by a health care professional in a hospital or clinic setting. Contact your pediatrician or health care professional regarding the use of this medicine in children. Special care may be needed. Overdosage: If you think you have taken too much of this medicine contact a poison control center or emergency room at once. NOTE: This medicine is only for you. Do not share this medicine with others. What if I miss a dose? It is important not to miss your dose. Call your doctor or health care professional if you are unable to keep an appointment. What may interact with this medicine? -bromocriptine -cyclosporine -medicines for diabetes, including insulin -medicines for heart disease or hypertension -quinidine This list may not describe all possible interactions. Give your health care provider a list of all the medicines, herbs, non-prescription drugs, or dietary supplements you use. Also tell them if you smoke, drink alcohol, or use illegal drugs. Some items may interact with your medicine. What should I watch for while using this medicine? Visit your doctor or  health care professional for regular checks on your progress. Your condition will be monitored carefully while you are receiving this medicine. This medicine may cause increases or decreases in blood sugar. Signs of high blood sugar include frequent urination, unusual thirst, flushed or dry skin, difficulty breathing, drowsiness, stomach ache, nausea, vomiting or dry mouth. Signs of low blood sugar include chills, cool, pale skin or cold sweats, drowsiness, extreme hunger, fast heartbeat, headache, nausea, nervousness or anxiety, shakiness, trembling, unsteadiness, tiredness, or weakness. Contact your doctor or health care professional right away if you experience any of these symptoms. What side effects may I notice from receiving this medicine? Side effects that you should report to your doctor or health care professional as soon as possible: -allergic reactions like skin rash, itching or hives, swelling of the face, lips, or tongue -changes in blood sugar -changes in heart rate -severe stomach pain Side effects that usually do not require medical attention (report to your doctor or health care professional if they continue or are bothersome): -diarrhea or constipation -gas or stomach pain -nausea, vomiting -pain, redness, swelling and irritation at site where injected This list may not describe all possible side effects. Call your doctor for medical advice about side effects. You may report side effects to FDA at 1-800-FDA-1088. Where should I keep my medicine? This drug is given in a hospital or clinic and will not be stored at home. NOTE: This sheet is a summary. It may not cover all possible information. If you have questions about this medicine, talk to your doctor, pharmacist, or health care provider.    2016, Elsevier/Gold Standard. (2013-01-26 17:43:04) Influenza Virus Vaccine injection What is this medicine? INFLUENZA VIRUS VACCINE (  in floo EN zuh VAHY ruhs vak SEEN) helps to reduce  the risk of getting influenza also known as the flu. The vaccine only helps protect you against some strains of the flu. This medicine may be used for other purposes; ask your health care provider or pharmacist if you have questions. What should I tell my health care provider before I take this medicine? They need to know if you have any of these conditions: -bleeding disorder like hemophilia -fever or infection -Guillain-Barre syndrome or other neurological problems -immune system problems -infection with the human immunodeficiency virus (HIV) or AIDS -low blood platelet counts -multiple sclerosis -an unusual or allergic reaction to influenza virus vaccine, latex, other medicines, foods, dyes, or preservatives. Different brands of vaccines contain different allergens. Some may contain latex or eggs. Talk to your doctor about your allergies to make sure that you get the right vaccine. -pregnant or trying to get pregnant -breast-feeding How should I use this medicine? This vaccine is for injection into a muscle or under the skin. It is given by a health care professional. A copy of Vaccine Information Statements will be given before each vaccination. Read this sheet carefully each time. The sheet may change frequently. Talk to your healthcare provider to see which vaccines are right for you. Some vaccines should not be used in all age groups. Overdosage: If you think you have taken too much of this medicine contact a poison control center or emergency room at once. NOTE: This medicine is only for you. Do not share this medicine with others. What if I miss a dose? This does not apply. What may interact with this medicine? -chemotherapy or radiation therapy -medicines that lower your immune system like etanercept, anakinra, infliximab, and adalimumab -medicines that treat or prevent blood clots like warfarin -phenytoin -steroid medicines like prednisone or  cortisone -theophylline -vaccines This list may not describe all possible interactions. Give your health care provider a list of all the medicines, herbs, non-prescription drugs, or dietary supplements you use. Also tell them if you smoke, drink alcohol, or use illegal drugs. Some items may interact with your medicine. What should I watch for while using this medicine? Report any side effects that do not go away within 3 days to your doctor or health care professional. Call your health care provider if any unusual symptoms occur within 6 weeks of receiving this vaccine. You may still catch the flu, but the illness is not usually as bad. You cannot get the flu from the vaccine. The vaccine will not protect against colds or other illnesses that may cause fever. The vaccine is needed every year. What side effects may I notice from receiving this medicine? Side effects that you should report to your doctor or health care professional as soon as possible: -allergic reactions like skin rash, itching or hives, swelling of the face, lips, or tongue Side effects that usually do not require medical attention (report to your doctor or health care professional if they continue or are bothersome): -fever -headache -muscle aches and pains -pain, tenderness, redness, or swelling at the injection site -tiredness This list may not describe all possible side effects. Call your doctor for medical advice about side effects. You may report side effects to FDA at 1-800-FDA-1088. Where should I keep my medicine? The vaccine will be given by a health care professional in a clinic, pharmacy, doctor's office, or other health care setting. You will not be given vaccine doses to store at home. NOTE: This  sheet is a summary. It may not cover all possible information. If you have questions about this medicine, talk to your doctor, pharmacist, or health care provider.    2016, Elsevier/Gold Standard. (2014-08-18  10:07:28)

## 2015-11-06 NOTE — Telephone Encounter (Signed)
Breast MRI is normal. But radiology still recommending mammogram and ultrasound to further evaluate area in her left breast.

## 2015-11-06 NOTE — Telephone Encounter (Signed)
Notified pt and she is agreeable to proceed with below recommendations. ORders signed.

## 2015-11-20 ENCOUNTER — Ambulatory Visit: Payer: Managed Care, Other (non HMO)

## 2015-11-21 ENCOUNTER — Encounter: Payer: Self-pay | Admitting: Family

## 2015-12-04 ENCOUNTER — Ambulatory Visit (HOSPITAL_BASED_OUTPATIENT_CLINIC_OR_DEPARTMENT_OTHER): Payer: Managed Care, Other (non HMO)

## 2015-12-04 ENCOUNTER — Other Ambulatory Visit: Payer: Self-pay | Admitting: Family

## 2015-12-04 VITALS — BP 149/92 | HR 84 | Temp 98.5°F | Resp 16

## 2015-12-04 DIAGNOSIS — C7A00 Malignant carcinoid tumor of unspecified site: Secondary | ICD-10-CM | POA: Diagnosis not present

## 2015-12-04 DIAGNOSIS — C7B8 Other secondary neuroendocrine tumors: Secondary | ICD-10-CM

## 2015-12-04 MED ORDER — LANREOTIDE ACETATE 120 MG/0.5ML ~~LOC~~ SOLN
120.0000 mg | Freq: Once | SUBCUTANEOUS | Status: AC
  Administered 2015-12-04: 120 mg via SUBCUTANEOUS

## 2015-12-04 MED ORDER — LANREOTIDE ACETATE 120 MG/0.5ML ~~LOC~~ SOLN
SUBCUTANEOUS | Status: AC
  Filled 2015-12-04: qty 120

## 2015-12-04 NOTE — Patient Instructions (Signed)
Lanreotide injection What is this medicine? LANREOTIDE (lan REE oh tide) is used to reduce blood levels of growth hormone in patients with a condition called acromegaly. It also works to slow or stop tumor growth in patients with gastroenteropancreatic neuroendocrine tumor (GEP-NET). This medicine may be used for other purposes; ask your health care provider or pharmacist if you have questions. What should I tell my health care provider before I take this medicine? They need to know if you have any of these conditions: -diabetes -gallbladder disease -heart disease -kidney disease -liver disease -an unusual or allergic reaction to lanreotide, other medicines, latex, foods, dyes, or preservatives -pregnant or trying to get pregnant -breast-feeding How should I use this medicine? This medicine is for injection under the skin. It is given by a health care professional in a hospital or clinic setting. Contact your pediatrician or health care professional regarding the use of this medicine in children. Special care may be needed. Overdosage: If you think you have taken too much of this medicine contact a poison control center or emergency room at once. NOTE: This medicine is only for you. Do not share this medicine with others. What if I miss a dose? It is important not to miss your dose. Call your doctor or health care professional if you are unable to keep an appointment. What may interact with this medicine? -bromocriptine -cyclosporine -medicines for diabetes, including insulin -medicines for heart disease or hypertension -quinidine This list may not describe all possible interactions. Give your health care provider a list of all the medicines, herbs, non-prescription drugs, or dietary supplements you use. Also tell them if you smoke, drink alcohol, or use illegal drugs. Some items may interact with your medicine. What should I watch for while using this medicine? Visit your doctor or  health care professional for regular checks on your progress. Your condition will be monitored carefully while you are receiving this medicine. This medicine may cause increases or decreases in blood sugar. Signs of high blood sugar include frequent urination, unusual thirst, flushed or dry skin, difficulty breathing, drowsiness, stomach ache, nausea, vomiting or dry mouth. Signs of low blood sugar include chills, cool, pale skin or cold sweats, drowsiness, extreme hunger, fast heartbeat, headache, nausea, nervousness or anxiety, shakiness, trembling, unsteadiness, tiredness, or weakness. Contact your doctor or health care professional right away if you experience any of these symptoms. What side effects may I notice from receiving this medicine? Side effects that you should report to your doctor or health care professional as soon as possible: -allergic reactions like skin rash, itching or hives, swelling of the face, lips, or tongue -changes in blood sugar -changes in heart rate -severe stomach pain Side effects that usually do not require medical attention (report to your doctor or health care professional if they continue or are bothersome): -diarrhea or constipation -gas or stomach pain -nausea, vomiting -pain, redness, swelling and irritation at site where injected This list may not describe all possible side effects. Call your doctor for medical advice about side effects. You may report side effects to FDA at 1-800-FDA-1088. Where should I keep my medicine? This drug is given in a hospital or clinic and will not be stored at home. NOTE: This sheet is a summary. It may not cover all possible information. If you have questions about this medicine, talk to your doctor, pharmacist, or health care provider.    2016, Elsevier/Gold Standard. (2013-01-26 17:43:04)

## 2015-12-12 ENCOUNTER — Ambulatory Visit: Payer: Managed Care, Other (non HMO)

## 2015-12-12 ENCOUNTER — Ambulatory Visit: Payer: Managed Care, Other (non HMO) | Admitting: Family

## 2015-12-12 ENCOUNTER — Other Ambulatory Visit: Payer: Managed Care, Other (non HMO)

## 2015-12-24 ENCOUNTER — Other Ambulatory Visit: Payer: Self-pay | Admitting: Family

## 2015-12-28 ENCOUNTER — Telehealth: Payer: Self-pay

## 2015-12-28 NOTE — Telephone Encounter (Signed)
Received call from pt notifying us that her liver resection is scheduled for 11/27 & she has been advised not to receive Somatuline next week.  Per Dr Marin Olp, he is in agreement with this. Pt does not be evaluated next week. Pt agrees and is aware that lab/ML/injection for 11/21 will be canceled. Pt will contact our office after surgery to reschedule these appts. dph

## 2016-01-01 ENCOUNTER — Ambulatory Visit: Payer: Managed Care, Other (non HMO)

## 2016-01-01 ENCOUNTER — Other Ambulatory Visit: Payer: Managed Care, Other (non HMO)

## 2016-01-01 ENCOUNTER — Ambulatory Visit: Payer: Managed Care, Other (non HMO) | Admitting: Family

## 2016-01-02 ENCOUNTER — Other Ambulatory Visit: Payer: Self-pay | Admitting: Family

## 2016-01-07 DIAGNOSIS — D3A8 Other benign neuroendocrine tumors: Secondary | ICD-10-CM | POA: Insufficient documentation

## 2016-01-16 ENCOUNTER — Other Ambulatory Visit: Payer: Self-pay

## 2016-01-16 DIAGNOSIS — C7B8 Other secondary neuroendocrine tumors: Secondary | ICD-10-CM

## 2016-01-17 ENCOUNTER — Ambulatory Visit (HOSPITAL_BASED_OUTPATIENT_CLINIC_OR_DEPARTMENT_OTHER): Payer: Managed Care, Other (non HMO)

## 2016-01-17 ENCOUNTER — Other Ambulatory Visit (HOSPITAL_BASED_OUTPATIENT_CLINIC_OR_DEPARTMENT_OTHER): Payer: Managed Care, Other (non HMO)

## 2016-01-17 ENCOUNTER — Ambulatory Visit (HOSPITAL_BASED_OUTPATIENT_CLINIC_OR_DEPARTMENT_OTHER): Payer: Managed Care, Other (non HMO) | Admitting: Family

## 2016-01-17 VITALS — BP 138/89 | HR 106 | Temp 98.1°F | Resp 16 | Wt 240.0 lb

## 2016-01-17 DIAGNOSIS — C7A00 Malignant carcinoid tumor of unspecified site: Secondary | ICD-10-CM

## 2016-01-17 DIAGNOSIS — C7B02 Secondary carcinoid tumors of liver: Secondary | ICD-10-CM

## 2016-01-17 DIAGNOSIS — C7B8 Other secondary neuroendocrine tumors: Secondary | ICD-10-CM

## 2016-01-17 LAB — CBC WITH DIFFERENTIAL (CANCER CENTER ONLY)
BASO#: 0.1 10*3/uL (ref 0.0–0.2)
BASO%: 0.6 % (ref 0.0–2.0)
EOS ABS: 0.5 10*3/uL (ref 0.0–0.5)
EOS%: 6 % (ref 0.0–7.0)
HCT: 34.6 % — ABNORMAL LOW (ref 34.8–46.6)
HGB: 11.1 g/dL — ABNORMAL LOW (ref 11.6–15.9)
LYMPH#: 2.5 10*3/uL (ref 0.9–3.3)
LYMPH%: 32 % (ref 14.0–48.0)
MCH: 29.7 pg (ref 26.0–34.0)
MCHC: 32.1 g/dL (ref 32.0–36.0)
MCV: 93 fL (ref 81–101)
MONO#: 0.6 10*3/uL (ref 0.1–0.9)
MONO%: 7.1 % (ref 0.0–13.0)
NEUT#: 4.2 10*3/uL (ref 1.5–6.5)
NEUT%: 54.3 % (ref 39.6–80.0)
PLATELETS: 287 10*3/uL (ref 145–400)
RBC: 3.74 10*6/uL (ref 3.70–5.32)
RDW: 14.6 % (ref 11.1–15.7)
WBC: 7.8 10*3/uL (ref 3.9–10.0)

## 2016-01-17 LAB — COMPREHENSIVE METABOLIC PANEL
ALT: 38 U/L (ref 0–55)
ANION GAP: 12 meq/L — AB (ref 3–11)
AST: 34 U/L (ref 5–34)
Albumin: 2.8 g/dL — ABNORMAL LOW (ref 3.5–5.0)
Alkaline Phosphatase: 90 U/L (ref 40–150)
BUN: 6.6 mg/dL — ABNORMAL LOW (ref 7.0–26.0)
CALCIUM: 8.7 mg/dL (ref 8.4–10.4)
CHLORIDE: 102 meq/L (ref 98–109)
CO2: 27 mEq/L (ref 22–29)
Creatinine: 0.7 mg/dL (ref 0.6–1.1)
Glucose: 125 mg/dl (ref 70–140)
POTASSIUM: 3.2 meq/L — AB (ref 3.5–5.1)
Sodium: 141 mEq/L (ref 136–145)
Total Bilirubin: 0.42 mg/dL (ref 0.20–1.20)
Total Protein: 6.6 g/dL (ref 6.4–8.3)

## 2016-01-17 LAB — LACTATE DEHYDROGENASE: LDH: 352 U/L — ABNORMAL HIGH (ref 125–245)

## 2016-01-17 LAB — TECHNOLOGIST REVIEW CHCC SATELLITE

## 2016-01-17 MED ORDER — LANREOTIDE ACETATE 120 MG/0.5ML ~~LOC~~ SOLN
120.0000 mg | Freq: Once | SUBCUTANEOUS | Status: AC
  Administered 2016-01-17: 120 mg via SUBCUTANEOUS

## 2016-01-17 MED ORDER — LANREOTIDE ACETATE 120 MG/0.5ML ~~LOC~~ SOLN
SUBCUTANEOUS | Status: AC
  Filled 2016-01-17: qty 120

## 2016-01-17 NOTE — Patient Instructions (Signed)
Lanreotide injection What is this medicine? LANREOTIDE (lan REE oh tide) is used to reduce blood levels of growth hormone in patients with a condition called acromegaly. It also works to slow or stop tumor growth in patients with gastroenteropancreatic neuroendocrine tumor (GEP-NET). This medicine may be used for other purposes; ask your health care provider or pharmacist if you have questions. What should I tell my health care provider before I take this medicine? They need to know if you have any of these conditions: -diabetes -gallbladder disease -heart disease -kidney disease -liver disease -an unusual or allergic reaction to lanreotide, other medicines, latex, foods, dyes, or preservatives -pregnant or trying to get pregnant -breast-feeding How should I use this medicine? This medicine is for injection under the skin. It is given by a health care professional in a hospital or clinic setting. Contact your pediatrician or health care professional regarding the use of this medicine in children. Special care may be needed. Overdosage: If you think you have taken too much of this medicine contact a poison control center or emergency room at once. NOTE: This medicine is only for you. Do not share this medicine with others. What if I miss a dose? It is important not to miss your dose. Call your doctor or health care professional if you are unable to keep an appointment. What may interact with this medicine? -bromocriptine -cyclosporine -medicines for diabetes, including insulin -medicines for heart disease or hypertension -quinidine This list may not describe all possible interactions. Give your health care provider a list of all the medicines, herbs, non-prescription drugs, or dietary supplements you use. Also tell them if you smoke, drink alcohol, or use illegal drugs. Some items may interact with your medicine. What should I watch for while using this medicine? Visit your doctor or  health care professional for regular checks on your progress. Your condition will be monitored carefully while you are receiving this medicine. This medicine may cause increases or decreases in blood sugar. Signs of high blood sugar include frequent urination, unusual thirst, flushed or dry skin, difficulty breathing, drowsiness, stomach ache, nausea, vomiting or dry mouth. Signs of low blood sugar include chills, cool, pale skin or cold sweats, drowsiness, extreme hunger, fast heartbeat, headache, nausea, nervousness or anxiety, shakiness, trembling, unsteadiness, tiredness, or weakness. Contact your doctor or health care professional right away if you experience any of these symptoms. What side effects may I notice from receiving this medicine? Side effects that you should report to your doctor or health care professional as soon as possible: -allergic reactions like skin rash, itching or hives, swelling of the face, lips, or tongue -changes in blood sugar -changes in heart rate -severe stomach pain Side effects that usually do not require medical attention (report to your doctor or health care professional if they continue or are bothersome): -diarrhea or constipation -gas or stomach pain -nausea, vomiting -pain, redness, swelling and irritation at site where injected This list may not describe all possible side effects. Call your doctor for medical advice about side effects. You may report side effects to FDA at 1-800-FDA-1088. Where should I keep my medicine? This drug is given in a hospital or clinic and will not be stored at home. NOTE: This sheet is a summary. It may not cover all possible information. If you have questions about this medicine, talk to your doctor, pharmacist, or health care provider.    2016, Elsevier/Gold Standard. (2013-01-26 17:43:04)

## 2016-01-17 NOTE — Progress Notes (Signed)
Hematology and Oncology Follow Up Visit  Leah [name] [id number] 1960-05-17 55 y.o. 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 She was seen by Dr. Jyl Heinz with [facility] for an incisional hernia repair. She was also found at that time to have a recurrent neuroendocrine liver metastasis. She underwent the hernia repair and also had an open liver ablation that same day.  She is healing nicely. During admission she wore a corset style binder which rubbed a sore on her lower right abdomen. This is dry with a scab and appears to be healing nicely. Her midline abdominal incision is also healing nicely and is currently clean, dry and intact. No erythema or sign of infection.  She has on a standard binder at this time and has had no other issues.  Her pain is controlled effectively with oxycodone. She has had no constipation.  No fever, chills, cough, rash, dizziness, headache, vision changes, SOB, chest pain or changes in bowel or bladder habits. No s/s of UTI.  No swelling, numbness or tingling in her extremities. No new aches or c/o "bone" pain.  No lymphadenopathy found on exam.  She is on Lovenox post surgery and has noticed that she is bruising easily. No episodes of bleeding.  She has maintained a good appetite and is staying well hydrated. Her weight is stable.   Medications:    Medication List       Accurate as of 01/17/16 11:07 AM. Always use your most recent med list.          amLODipine 10 MG tablet Commonly known as:  NORVASC Take 1 tablet (10 mg total) by mouth daily.   aspirin 81 MG EC tablet Take 1 tablet (81 mg total) by mouth daily.   benzonatate 100 MG capsule Commonly known as:  TESSALON Take 1 capsule (100 mg total) by mouth 3 (three)  times daily as needed for cough.   fexofenadine 180 MG tablet Commonly known as:  [facility] ALLERGY RELIEF Take 1 tablet (180 mg total) by mouth daily.   fluticasone 50 MCG/ACT nasal spray Commonly known as:  FLONASE USE 2 SPRAYS IN BOTH NOSTRILS EVERY DAY   meloxicam 7.5 MG tablet Commonly known as:  MOBIC TAKE 1 TABLET (7.5 MG TOTAL) BY MOUTH DAILY.   metoprolol succinate 100 MG 24 hr tablet Commonly known as:  TOPROL-XL TAKE 1 TABLET BY MOUTH EVERY DAY WITH OR IMMEDIATELY FOLLOWING A MEAL   metroNIDAZOLE 0.75 % cream Commonly known as:  METROCREAM Apply topically.   montelukast 10 MG tablet Commonly known as:  SINGULAIR Take 1 tablet (10 mg total) by mouth at bedtime.   MULTI VITAMIN DAILY PO Take by mouth every morning.   ondansetron 4 MG disintegrating tablet Commonly known as:  ZOFRAN-ODT TAKE 1 TABLET BY MOUTH EVERY 8 HOURS AS NEEDED FOR NAUSEA OR VOMITING.   Ospemifene 60 MG Tabs Commonly known as:  OSPHENA Take 60 mg by mouth daily. 90 day supply   pantoprazole 40 MG tablet Commonly known as:  PROTONIX TAKE 1 TABLET (40 MG TOTAL) BY MOUTH DAILY.   PROAIR HFA 108 (90 Base) MCG/ACT inhaler Generic drug:  albuterol INHALE 2 PUFFS INTO THE LUNGS EVERY 6 (SIX) HOURS AS NEEDED. FOR SHORTNESS OF BREATH.   valACYclovir 500 MG tablet Commonly  known as:  VALTREX Take 500 mg by mouth 2 (two) times daily as needed. Cold sores   valsartan 320 MG tablet Commonly known as:  DIOVAN Take 1 tablet (320 mg total) by mouth daily.   venlafaxine XR 150 MG 24 hr capsule Commonly known as:  EFFEXOR-XR TAKE 1 CAPSULE (150 MG TOTAL) BY MOUTH DAILY.       Allergies: No Known Allergies  Past Medical History, Surgical history, Social history, and Family History were reviewed and updated.  Review of Systems: All other 10 point review of systems is negative.   Physical Exam:  vitals were not taken for this visit.  Wt Readings from Last 3 Encounters:  09/19/15 226 lb 6.4 oz  (102.7 kg)  09/10/15 226 lb 9.6 oz (102.8 kg)  09/05/15 228 lb (103.4 kg)    Ocular: Sclerae unicteric, pupils equal, round and reactive to light Ear-nose-throat: Oropharynx clear, dentition fair Lymphatic: No cervical supraclavicular or axillary adenopathy Lungs no rales or rhonchi, good excursion bilaterally Heart regular rate and rhythm, no murmur appreciated Abd soft, nontender, positive bowel sounds, no liver or spleen tip palpated on exam, no fluid wave MSK no focal spinal tenderness, no joint edema Neuro: non-focal, well-oriented, appropriate affect Breasts: Deferred  Lab Results  Component Value Date   WBC 10.8 (H) 09/19/2015   HGB 13.5 09/19/2015   HCT 40.2 09/19/2015   MCV 86.7 09/19/2015   PLT 308.0 09/19/2015   Lab Results  Component Value Date   FERRITIN 382 (H) 10/18/2008   IRON <10 (L) 10/18/2008   TIBC NOT CALC Not calculated due to Iron <10. 10/18/2008   UIBC 156 10/18/2008   IRONPCTSAT NOT CALC Not calculated due to Iron <10. 10/18/2008   Lab Results  Component Value Date   RETICCTPCT 0.9 10/18/2008   RBC 4.64 09/19/2015   No results found for: KPAFRELGTCHN, LAMBDASER, KAPLAMBRATIO No results found for: IGGSERUM, IGA, IGMSERUM No results found for: Odetta Pink, SPEI   Chemistry      Component Value Date/Time   NA 141 09/19/2015 1121   NA 137 09/05/2015 1112   NA 139 02/27/2015 1120   K 4.5 09/19/2015 1121   K 3.8 09/05/2015 1112   K 3.9 02/27/2015 1120   CL 104 09/19/2015 1121   CL 105 09/05/2015 1112   CO2 30 09/19/2015 1121   CO2 26 09/05/2015 1112   CO2 25 02/27/2015 1120   BUN 16 09/19/2015 1121   BUN 14 09/05/2015 1112   BUN 13.2 02/27/2015 1120   CREATININE 0.77 09/19/2015 1121   CREATININE 1.0 09/05/2015 1112   CREATININE 0.8 02/27/2015 1120      Component Value Date/Time   CALCIUM 9.2 09/19/2015 1121   CALCIUM 9.1 09/05/2015 1112   CALCIUM 9.2 02/27/2015 1120   ALKPHOS 95  09/19/2015 1121   ALKPHOS 84 09/05/2015 1112   ALKPHOS 105 02/27/2015 1120   AST 18 09/19/2015 1121   AST 24 09/05/2015 1112   AST 24 02/27/2015 1120   ALT 18 09/19/2015 1121   ALT 26 09/05/2015 1112   ALT 31 02/27/2015 1120   BILITOT 0.6 09/19/2015 1121   BILITOT 0.70 09/05/2015 1112   BILITOT 0.43 02/27/2015 1120     Impression and Plan: Ms. Champlain is 55 yo white female with a metastatic neuroendocrine carcinoid with recent recurrence of liver metastasis. She had an open liver ablation and incisional hernia repair on 01/07/16. She is recuperating nicely and has had no issue  with infection. She has no complaints at this time.  CBC looks good. CMP and Chromogranin level today are pending.  She follows up with Dr. Crisoforo Oxford in 2-3 weeks and plans to call and make her appointment today.  She will continue her monthly injections and we will plan to see her back for labs and follow-up in 2 months.  She will contact our office with any questions or concerns. We can certainly see her sooner if need be.   Eliezer Bottom, NP 12/7/201711:07 AM

## 2016-01-21 LAB — CHROMOGRANIN A: Chromogranin A: 2 nmol/L (ref 0–5)

## 2016-01-25 ENCOUNTER — Other Ambulatory Visit: Payer: Self-pay | Admitting: Family

## 2016-01-28 NOTE — Telephone Encounter (Signed)
Refill sent per [facility] refill protocol/SLS  

## 2016-01-31 ENCOUNTER — Emergency Department (HOSPITAL_COMMUNITY)
Admission: EM | Admit: 2016-01-31 | Discharge: 2016-02-01 | Disposition: A | Discharge status: Home / Self Care | Payer: Managed Care, Other (non HMO) | Attending: Emergency Medicine | Admitting: Emergency Medicine

## 2016-01-31 ENCOUNTER — Encounter (HOSPITAL_COMMUNITY): Payer: Self-pay | Admitting: Emergency Medicine

## 2016-01-31 DIAGNOSIS — Z853 Personal history of malignant neoplasm of breast: Secondary | ICD-10-CM | POA: Insufficient documentation

## 2016-01-31 DIAGNOSIS — L03311 Cellulitis of abdominal wall: Secondary | ICD-10-CM | POA: Insufficient documentation

## 2016-01-31 DIAGNOSIS — I10 Essential (primary) hypertension: Secondary | ICD-10-CM | POA: Insufficient documentation

## 2016-01-31 DIAGNOSIS — Z7982 Long term (current) use of aspirin: Secondary | ICD-10-CM | POA: Insufficient documentation

## 2016-01-31 DIAGNOSIS — J111 Influenza due to unidentified influenza virus with other respiratory manifestations: Secondary | ICD-10-CM | POA: Diagnosis not present

## 2016-01-31 DIAGNOSIS — R1110 Vomiting, unspecified: Secondary | ICD-10-CM | POA: Diagnosis present

## 2016-01-31 DIAGNOSIS — E119 Type 2 diabetes mellitus without complications: Secondary | ICD-10-CM | POA: Insufficient documentation

## 2016-01-31 DIAGNOSIS — Z79899 Other long term (current) drug therapy: Secondary | ICD-10-CM | POA: Diagnosis not present

## 2016-01-31 DIAGNOSIS — R69 Illness, unspecified: Secondary | ICD-10-CM

## 2016-01-31 DIAGNOSIS — Z8505 Personal history of malignant neoplasm of liver: Secondary | ICD-10-CM | POA: Diagnosis not present

## 2016-01-31 NOTE — ED Triage Notes (Signed)
Pt from home with complaints of diarrhea, emesis, sure throat and cough. The diarrhea began on Monday and the emesis began yesterday. Pt states her husband was diagnosed with the flu and she wanted to be ensure she didn't have the flu as well.  Pt has surgery on 11/27 to removed tumors on her liver. Pt has redness in her surgical area and has warmth to her skin around the same area.

## 2016-02-01 LAB — DIFFERENTIAL
BASOS ABS: 0 10*3/uL (ref 0.0–0.1)
BASOS PCT: 0 %
EOS ABS: 0.8 10*3/uL — AB (ref 0.0–0.7)
Eosinophils Relative: 6 %
Lymphocytes Relative: 10 %
Lymphs Abs: 1.5 10*3/uL (ref 0.7–4.0)
Monocytes Absolute: 1.2 10*3/uL — ABNORMAL HIGH (ref 0.1–1.0)
Monocytes Relative: 8 %
NEUTROS ABS: 11.6 10*3/uL — AB (ref 1.7–7.7)
NEUTROS PCT: 76 %

## 2016-02-01 LAB — URINALYSIS, ROUTINE W REFLEX MICROSCOPIC
Bilirubin Urine: NEGATIVE
GLUCOSE, UA: 150 mg/dL — AB
HGB URINE DIPSTICK: NEGATIVE
Ketones, ur: NEGATIVE mg/dL
LEUKOCYTES UA: NEGATIVE
Nitrite: NEGATIVE
PROTEIN: 30 mg/dL — AB
SPECIFIC GRAVITY, URINE: 1.029 (ref 1.005–1.030)
pH: 5 (ref 5.0–8.0)

## 2016-02-01 LAB — CBC
HCT: 35 % — ABNORMAL LOW (ref 36.0–46.0)
Hemoglobin: 11.5 g/dL — ABNORMAL LOW (ref 12.0–15.0)
MCH: 29.2 pg (ref 26.0–34.0)
MCHC: 32.9 g/dL (ref 30.0–36.0)
MCV: 88.8 fL (ref 78.0–100.0)
PLATELETS: 315 10*3/uL (ref 150–400)
RBC: 3.94 MIL/uL (ref 3.87–5.11)
RDW: 14.2 % (ref 11.5–15.5)
WBC: 15 10*3/uL — AB (ref 4.0–10.5)

## 2016-02-01 LAB — COMPREHENSIVE METABOLIC PANEL
ALK PHOS: 84 U/L (ref 38–126)
ALT: 21 U/L (ref 14–54)
AST: 19 U/L (ref 15–41)
Albumin: 3.3 g/dL — ABNORMAL LOW (ref 3.5–5.0)
Anion gap: 8 (ref 5–15)
BILIRUBIN TOTAL: 0.6 mg/dL (ref 0.3–1.2)
BUN: 21 mg/dL — AB (ref 6–20)
CALCIUM: 8.8 mg/dL — AB (ref 8.9–10.3)
CO2: 22 mmol/L (ref 22–32)
CREATININE: 0.96 mg/dL (ref 0.44–1.00)
Chloride: 107 mmol/L (ref 101–111)
Glucose, Bld: 276 mg/dL — ABNORMAL HIGH (ref 65–99)
Potassium: 3.7 mmol/L (ref 3.5–5.1)
Sodium: 137 mmol/L (ref 135–145)
TOTAL PROTEIN: 7.4 g/dL (ref 6.5–8.1)

## 2016-02-01 LAB — RAPID STREP SCREEN (MED CTR MEBANE ONLY): Streptococcus, Group A Screen (Direct): NEGATIVE

## 2016-02-01 LAB — I-STAT CG4 LACTIC ACID, ED: LACTIC ACID, VENOUS: 2.17 mmol/L — AB (ref 0.5–1.9)

## 2016-02-01 LAB — LIPASE, BLOOD: Lipase: 24 U/L (ref 11–51)

## 2016-02-01 MED ORDER — LOPERAMIDE HCL 2 MG PO CAPS
4.0000 mg | ORAL_CAPSULE | Freq: Once | ORAL | Status: AC
  Administered 2016-02-01: 4 mg via ORAL
  Filled 2016-02-01: qty 2

## 2016-02-01 MED ORDER — CEPHALEXIN 500 MG PO CAPS: 500.0000 mg | ORAL_CAPSULE | Freq: Four times a day (QID) | ORAL | 0 refills | Status: DC

## 2016-02-01 MED ORDER — SODIUM CHLORIDE 0.9 % IV BOLUS (SEPSIS)
1000.0000 mL | Freq: Once | INTRAVENOUS | Status: AC
  Administered 2016-02-01: 1000 mL via INTRAVENOUS

## 2016-02-01 MED ORDER — OSELTAMIVIR PHOSPHATE 75 MG PO CAPS: 75.0000 mg | ORAL_CAPSULE | Freq: Two times a day (BID) | ORAL | 0 refills | Status: DC

## 2016-02-01 MED ORDER — CEPHALEXIN 500 MG PO CAPS
500.0000 mg | ORAL_CAPSULE | Freq: Once | ORAL | Status: AC
  Administered 2016-02-01: 500 mg via ORAL
  Filled 2016-02-01: qty 1

## 2016-02-01 MED ORDER — ONDANSETRON HCL 4 MG/2ML IJ SOLN
4.0000 mg | Freq: Once | INTRAMUSCULAR | Status: AC
  Administered 2016-02-01: 4 mg via INTRAVENOUS
  Filled 2016-02-01: qty 2

## 2016-02-01 NOTE — ED Provider Notes (Signed)
[name] Provider Note   CSN: [id number] Arrival date & time: 01/31/16  2310  By signing my name below, I, [name], attest that this documentation has been prepared under the direction and in the presence of [name], MD . Electronically Signed: [name], Scribe. 02/01/2016. 12:04 AM.  History   Chief Complaint Chief Complaint  Patient presents with  . Generalized Body Aches  . Abdominal Pain  . Post-op Problem   The history is provided by the patient. No language interpreter was used.   HPI Comments: Leah [name] is a 55 y.o. female who presents to the Emergency Department complaining of vomiting that started yesterday. She had a surgery on 01/07/16 for tumor removal in her liver. She reports feeling sick some time after the surgery. She reports associated symptoms of chills, subjective fever, diarrhea (onset 4 days ago), cough, and sore throat (onset last night). Pt's temperature in the ED today was 98.7. She took tylenol for fever and zofran for nausea with minimal relief. She admits to having a flu vaccination this season. She denies any abdominal pain or other complaints.     PCP: [name], NP  Past Medical History:  Diagnosis Date  . Allergic rhinitis   . Atrophic vaginitis   . Borderline diabetes 03/17/2013  . 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 GE reflux   . Hepatic encephalopathy ([place]) 2011  . History of retinal vein occlusion    right eye  . HTN (hypertension) 2011  . Migraines   . Neuroendocrine tumor 2011  . Orthostatic hypotension   . Palpitations   . Sarcoidosis ([place]) 2008  . STD (sexually transmitted disease)    HSV I  . Surgical menopause     Patient Active Problem List   Diagnosis Date Noted  . Neuroendocrine tumor of liver 01/07/2016  . Incisional hernia, without obstruction or gangrene 10/29/2015  . Osteopenia 09/27/2015  . Branch retinal vein occlusion 12/12/2014  . Hypertensive retinopathy 12/12/2014  . Edema,  retina 12/12/2014  . Lung nodule seen on imaging study 05/01/2014  . Shortness of breath   . History of sarcoidosis   . Arthritis of knee, degenerative 07/26/2013  . Palpitations 04/10/2013  . Diabetes mellitus type 2, controlled ([place]) 03/17/2013  . Routine physical examination 03/14/2013  . Morbid obesity ([place]) 03/01/2013  . 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 Retinopathy 01/18/2013  . Leukocytosis, unspecified 10/15/2012  . 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 Idiopathic localized osteoarthropathy 06/09/2012  . Gonalgia 03/17/2012  . 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 Malignant neoplasm of breast ([place]) 01/08/2011  . 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 Hypertension 10/22/2010  . GE reflux 10/22/2010  . Allergic rhinitis 10/22/2010  . History of bilateral mastectomy 10/22/2010  . Sarcoidosis ([place]) 10/22/2010  . Osteoarthritis 10/22/2010  . Anxiety 10/22/2010    Past Surgical History:  Procedure Laterality Date  . ABDOMINAL HYSTERECTOMY  2004  . bilateral mastectectomy Bilateral 2008  . BREAST SURGERY     bilateral masectomy and reconstruction with silicone implants  . CESAREAN SECTION  [id number]  . CHOLECYSTECTOMY  2006  . hepatetetomy    . KNEE ARTHROSCOPY Left 09/2010  . latissimus dorsi graft  2008   used for reconstructive breast surgery  . MASTECTOMY  2008   bilateral  . OOPHORECTOMY  2004  . TONSILLECTOMY  1976  . TUBAL LIGATION  2001   BTL    OB History    Gravida Para Term  Preterm AB Living   2 2 2     2    SAB TAB Ectopic Multiple Live Births                   Home Medications    Prior to Admission medications   Medication Sig Start Date End Date Taking? Authorizing Provider  acetaminophen (TYLENOL) 500 MG tablet Take 500 mg by mouth every 6 (six) hours as needed for mild pain or fever.   Yes Historical Provider, MD  amLODipine (NORVASC) 10 MG tablet TAKE 1 TABLET (10 MG TOTAL) BY MOUTH  DAILY. Patient taking differently: Take 10 mg by mouth at bedtime.  01/28/16  Yes [name], NP  aspirin 81 MG EC tablet TAKE 1 TABLET (81 MG TOTAL) BY MOUTH DAILY. 01/28/16  Yes [name], NP  benzonatate (TESSALON) 100 MG capsule Take 1 capsule (100 mg total) by mouth 3 (three) times daily as needed for cough. 09/19/15  Yes [name], NP  fexofenadine ([employer] ALLERGY RELIEF) 180 MG tablet Take 1 tablet (180 mg total) by mouth daily. Patient taking differently: Take 180 mg by mouth every morning.  10/22/15  Yes [name], NP  fluticasone (FLONASE) 50 MCG/ACT nasal spray USE 2 SPRAYS IN BOTH NOSTRILS EVERY DAY 11/06/14  Yes [name], MD  gabapentin (NEURONTIN) 300 MG capsule Take 300 mg by mouth 3 (three) times daily. 01/14/16 01/31/16 Yes Historical Provider, MD  metoprolol succinate (TOPROL-XL) 100 MG 24 hr tablet TAKE 1 TABLET BY MOUTH EVERY DAY WITH OR IMMEDIATELY FOLLOWING A MEAL Patient taking differently: TAKE 100 MG MOUTH EACH NIGHT WITH OR IMMEDIATELY FOLLOWING A MEAL 01/28/16  Yes [name], NP  montelukast (SINGULAIR) 10 MG tablet Take 1 tablet (10 mg total) by mouth at bedtime. 10/16/15  Yes [name], NP  naproxen (NAPROSYN) 500 MG tablet Take 500 mg by mouth 2 (two) times daily with a meal.   Yes Historical Provider, MD  octreotide (SANDOSTATIN LAR) 30 MG injection Inject 30 mg into the muscle every 28 (twenty-eight) days.   Yes Historical Provider, MD  ondansetron (ZOFRAN-ODT) 4 MG disintegrating tablet TAKE 1 TABLET BY MOUTH EVERY 8 HOURS AS NEEDED FOR NAUSEA OR VOMITING. Patient taking differently: TAKE 4 MG BY MOUTH EVERY 8 HOURS AS NEEDED FOR NAUSEA OR VOMITING. 01/28/16  Yes [name], NP  oxyCODONE (OXY IR/ROXICODONE) 5 MG immediate release tablet Take 5 mg by mouth every 6 (six) hours as needed for moderate pain or severe pain.  01/14/16  Yes Historical Provider, MD  pantoprazole (PROTONIX) 40 MG tablet TAKE 1 TABLET (40  MG TOTAL) BY MOUTH DAILY. Patient taking differently: TAKE 1 TABLET (40 MG TOTAL) BY MOUTH TWICE DAILY 08/02/15  Yes [name], NP  PROAIR HFA 108 (90 Base) MCG/ACT inhaler INHALE 2 PUFFS INTO THE LUNGS EVERY 6 (SIX) HOURS AS NEEDED. FOR SHORTNESS OF BREATH. 12/24/15  Yes [name], NP  valACYclovir (VALTREX) 500 MG tablet Take 500 mg by mouth 2 (two) times daily as needed. Cold sores 07/15/12  Yes Kem Boroughs, FNP  valsartan (DIOVAN) 320 MG tablet Take 1 tablet (320 mg total) by mouth daily. Patient taking differently: Take 320 mg by mouth at bedtime.  08/22/15  Yes [name], NP  venlafaxine XR (EFFEXOR-XR) 150 MG 24 hr capsule TAKE 1 CAPSULE (150 MG TOTAL) BY MOUTH DAILY. Patient taking differently: TAKE 1 CAPSULE (150 MG TOTAL) BY MOUTH EACH MORNING 01/28/16  Yes [name], NP  meloxicam (MOBIC) 7.5 MG tablet  TAKE 1 TABLET (7.5 MG TOTAL) BY MOUTH DAILY. Patient not taking: Reported on 01/31/2016 12/04/15   [name], NP  Ospemifene (OSPHENA) 60 MG TABS Take 60 mg by mouth daily. 90 day supply Patient not taking: Reported on 01/31/2016 07/10/15   [name], MD    Family History Family History  Problem Relation Age of Onset  . Arthritis Maternal Grandmother   . Arthritis Maternal Grandfather   . Arthritis Paternal Grandmother   . Arthritis Paternal Grandfather   . Heart failure Paternal Grandfather   . Breast cancer Mother 65    died at 2  . Lupus Mother   . Heart failure Father   . Hypertension Father   . Hyperlipidemia Father   . Breast cancer Maternal Aunt 38    deceased at 49 recurrence  . Ovarian cancer Maternal Aunt 41     died with in 66 months  . Breast cancer Maternal Aunt 109    died at age 85    Social History Social History  Substance Use Topics  . Smoking status: Never Smoker  . Smokeless tobacco: Never Used     Comment: never used tobacco  . Alcohol use No     Allergies   Patient has no known  allergies.   Review of Systems Review of Systems  Constitutional: Positive for chills and fever (Subjective).  HENT: Positive for sore throat.   Respiratory: Positive for cough.   Gastrointestinal: Positive for diarrhea. Negative for abdominal pain.  All other systems reviewed and are negative.    Physical Exam Updated Vital Signs BP (!) 161/116 (BP Location: Left Arm)   Pulse 120   Temp 98.7 F (37.1 C) (Oral)   Resp 20   Ht 4\' 11"  (1.499 m)   Wt 231 lb (104.8 kg)   SpO2 100%   BMI 46.66 kg/m   Physical Exam  Constitutional: She is oriented to person, place, and time. She appears well-developed and well-nourished.  HENT:  Head: Normocephalic and atraumatic.  Eyes: EOM are normal. Pupils are equal, round, and reactive to light.  Neck: Normal range of motion. Neck supple. No JVD present.  Cardiovascular: Regular rhythm and normal heart sounds.   No murmur heard. Tachycardic.   Pulmonary/Chest: Effort normal and breath sounds normal. She has no wheezes. She has no rales. She exhibits no tenderness.  Abdominal: Soft. Bowel sounds are normal. She exhibits no distension and no mass. There is no tenderness.  Midline incision with slight dehiscence on the superior aspect of incision. Mild warmth of the incision. Mild redness of the anterior abdominal wall.   Musculoskeletal: Normal range of motion. She exhibits no edema.  Lymphadenopathy:    She has no cervical adenopathy.  Neurological: She is alert and oriented to person, place, and time. No cranial nerve deficit. She exhibits normal muscle tone. Coordination normal.  Skin: Skin is warm and dry. No rash noted.  Psychiatric: She has a normal mood and affect. Her behavior is normal. Judgment and thought content normal.  Nursing note and vitals reviewed.    ED Treatments / Results  DIAGNOSTIC STUDIES: Oxygen Saturation is 100% on RA, normal by my interpretation.    COORDINATION OF CARE: 12:08 AM- Pt advised of plan for  treatment and pt agrees.  Labs (all labs ordered are listed, but only abnormal results are displayed) Labs Reviewed  COMPREHENSIVE METABOLIC PANEL - Abnormal; Notable for the following:       Result Value   Glucose, Bld 276 (*)  BUN 21 (*)    Calcium 8.8 (*)    Albumin 3.3 (*)    All other components within normal limits  CBC - Abnormal; Notable for the following:    WBC 15.0 (*)    Hemoglobin 11.5 (*)    HCT 35.0 (*)    All other components within normal limits  URINALYSIS, ROUTINE W REFLEX MICROSCOPIC - Abnormal; Notable for the following:    APPearance CLOUDY (*)    Glucose, UA 150 (*)    Protein, ur 30 (*)    Bacteria, UA RARE (*)    Squamous Epithelial / LPF 6-30 (*)    All other components within normal limits  DIFFERENTIAL - Abnormal; Notable for the following:    Neutro Abs 11.6 (*)    Monocytes Absolute 1.2 (*)    Eosinophils Absolute 0.8 (*)    All other components within normal limits  I-STAT CG4 LACTIC ACID, ED - Abnormal; Notable for the following:    Lactic Acid, Venous 2.17 (*)    All other components within normal limits  RAPID STREP SCREEN (NOT AT [facility])  CULTURE, GROUP A STREP ([place])  LIPASE, BLOOD    Procedures Procedures (including critical care time)  Medications Ordered in ED Medications  sodium chloride 0.9 % bolus 1,000 mL (not administered)  ondansetron (ZOFRAN) injection 4 mg (not administered)  loperamide (IMODIUM) capsule 4 mg (not administered)     Initial Impression / Assessment and Plan / ED Course  I have reviewed the triage vital signs and the nursing notes.  Pertinent lab results that were available during my care of the patient were reviewed by me and considered in my medical decision making (see chart for details).  Clinical Course    Probable influenza given patient's exposure to someone who had swab proven influenza. In addition, abdominal redness is concerning for cellulitis around her abdominal incision. She is noted to  be tachycardic. Screening labs are obtained including lactic acid which is borderline elevated. This is not felt to be due to sepsis given the patient's good overall appearance, lack of fever and normal blood pressure. She feels better after 1 L of saline and heart rate has come down. She is discharged with prescriptions for cephalexin and oseltamivir and is referred back to her surgeon for reevaluation of the incision.  Final Clinical Impressions(s) / ED Diagnoses   Final diagnoses:  Influenza-like illness  Cellulitis of abdominal wall    New Prescriptions New Prescriptions   CEPHALEXIN (KEFLEX) 500 MG CAPSULE    Take 1 capsule (500 mg total) by mouth 4 (four) times daily.   OSELTAMIVIR (TAMIFLU) 75 MG CAPSULE    Take 1 capsule (75 mg total) by mouth every 12 (twelve) hours.   I personally performed the services described in this documentation, which was scribed in my presence. The recorded information has been reviewed and is accurate.       [name], MD [id number]

## 2016-02-01 NOTE — ED Notes (Signed)
Lactic given to MD.

## 2016-02-01 NOTE — Discharge Instructions (Signed)
Return if the redness of your abdomen is getting worse. You should be rechecked on Tuesday - either by your surgeon, or your PCP.

## 2016-02-03 LAB — CULTURE, GROUP A STREP (THRC)

## 2016-02-05 ENCOUNTER — Other Ambulatory Visit: Payer: Self-pay | Admitting: Family

## 2016-02-09 ENCOUNTER — Other Ambulatory Visit: Payer: Self-pay | Admitting: Family

## 2016-02-12 NOTE — Telephone Encounter (Signed)
Rx sent to pharmacy   

## 2016-02-26 ENCOUNTER — Ambulatory Visit (HOSPITAL_BASED_OUTPATIENT_CLINIC_OR_DEPARTMENT_OTHER): Payer: Managed Care, Other (non HMO)

## 2016-02-26 VITALS — BP 154/72 | HR 95 | Temp 98.0°F | Resp 17

## 2016-02-26 DIAGNOSIS — C7A00 Malignant carcinoid tumor of unspecified site: Secondary | ICD-10-CM

## 2016-02-26 DIAGNOSIS — C7B02 Secondary carcinoid tumors of liver: Secondary | ICD-10-CM | POA: Diagnosis not present

## 2016-02-26 DIAGNOSIS — C7B8 Other secondary neuroendocrine tumors: Secondary | ICD-10-CM

## 2016-02-26 MED ORDER — LANREOTIDE ACETATE 120 MG/0.5ML ~~LOC~~ SOLN
SUBCUTANEOUS | Status: AC
  Filled 2016-02-26: qty 120

## 2016-02-26 MED ORDER — LANREOTIDE ACETATE 120 MG/0.5ML ~~LOC~~ SOLN
120.0000 mg | Freq: Once | SUBCUTANEOUS | Status: AC
  Administered 2016-02-26: 120 mg via SUBCUTANEOUS

## 2016-02-26 NOTE — Patient Instructions (Signed)
Lanreotide injection What is this medicine? LANREOTIDE (lan REE oh tide) is used to reduce blood levels of growth hormone in patients with a condition called acromegaly. It also works to slow or stop tumor growth in patients with gastroenteropancreatic neuroendocrine tumor (GEP-NET). This medicine may be used for other purposes; ask your health care provider or pharmacist if you have questions. What should I tell my health care provider before I take this medicine? They need to know if you have any of these conditions: -diabetes -gallbladder disease -heart disease -kidney disease -liver disease -an unusual or allergic reaction to lanreotide, other medicines, latex, foods, dyes, or preservatives -pregnant or trying to get pregnant -breast-feeding How should I use this medicine? This medicine is for injection under the skin. It is given by a health care professional in a hospital or clinic setting. Contact your pediatrician or health care professional regarding the use of this medicine in children. Special care may be needed. Overdosage: If you think you have taken too much of this medicine contact a poison control center or emergency room at once. NOTE: This medicine is only for you. Do not share this medicine with others. What if I miss a dose? It is important not to miss your dose. Call your doctor or health care professional if you are unable to keep an appointment. What may interact with this medicine? -bromocriptine -cyclosporine -medicines for diabetes, including insulin -medicines for heart disease or hypertension -quinidine This list may not describe all possible interactions. Give your health care provider a list of all the medicines, herbs, non-prescription drugs, or dietary supplements you use. Also tell them if you smoke, drink alcohol, or use illegal drugs. Some items may interact with your medicine. What should I watch for while using this medicine? Visit your doctor or  health care professional for regular checks on your progress. Your condition will be monitored carefully while you are receiving this medicine. This medicine may cause increases or decreases in blood sugar. Signs of high blood sugar include frequent urination, unusual thirst, flushed or dry skin, difficulty breathing, drowsiness, stomach ache, nausea, vomiting or dry mouth. Signs of low blood sugar include chills, cool, pale skin or cold sweats, drowsiness, extreme hunger, fast heartbeat, headache, nausea, nervousness or anxiety, shakiness, trembling, unsteadiness, tiredness, or weakness. Contact your doctor or health care professional right away if you experience any of these symptoms. What side effects may I notice from receiving this medicine? Side effects that you should report to your doctor or health care professional as soon as possible: -allergic reactions like skin rash, itching or hives, swelling of the face, lips, or tongue -changes in blood sugar -changes in heart rate -severe stomach pain Side effects that usually do not require medical attention (report to your doctor or health care professional if they continue or are bothersome): -diarrhea or constipation -gas or stomach pain -nausea, vomiting -pain, redness, swelling and irritation at site where injected This list may not describe all possible side effects. Call your doctor for medical advice about side effects. You may report side effects to FDA at 1-800-FDA-1088. Where should I keep my medicine? This drug is given in a hospital or clinic and will not be stored at home. NOTE: This sheet is a summary. It may not cover all possible information. If you have questions about this medicine, talk to your doctor, pharmacist, or health care provider.    2016, Elsevier/Gold Standard. (2013-01-26 17:43:04)

## 2016-02-28 ENCOUNTER — Other Ambulatory Visit: Payer: Self-pay | Admitting: Family

## 2016-03-27 ENCOUNTER — Ambulatory Visit (HOSPITAL_BASED_OUTPATIENT_CLINIC_OR_DEPARTMENT_OTHER): Payer: Managed Care, Other (non HMO) | Admitting: Hematology & Oncology

## 2016-03-27 ENCOUNTER — Other Ambulatory Visit (HOSPITAL_BASED_OUTPATIENT_CLINIC_OR_DEPARTMENT_OTHER): Payer: Managed Care, Other (non HMO)

## 2016-03-27 ENCOUNTER — Ambulatory Visit (HOSPITAL_BASED_OUTPATIENT_CLINIC_OR_DEPARTMENT_OTHER): Payer: Managed Care, Other (non HMO)

## 2016-03-27 ENCOUNTER — Other Ambulatory Visit: Payer: Self-pay | Admitting: Family

## 2016-03-27 VITALS — BP 139/86 | HR 98 | Temp 98.6°F | Wt 226.5 lb

## 2016-03-27 DIAGNOSIS — C7B02 Secondary carcinoid tumors of liver: Secondary | ICD-10-CM

## 2016-03-27 DIAGNOSIS — C7B8 Other secondary neuroendocrine tumors: Secondary | ICD-10-CM

## 2016-03-27 DIAGNOSIS — C7A00 Malignant carcinoid tumor of unspecified site: Secondary | ICD-10-CM

## 2016-03-27 LAB — CBC WITH DIFFERENTIAL (CANCER CENTER ONLY)
BASO#: 0.1 10*3/uL (ref 0.0–0.2)
BASO%: 0.6 % (ref 0.0–2.0)
EOS%: 2.1 % (ref 0.0–7.0)
Eosinophils Absolute: 0.3 10*3/uL (ref 0.0–0.5)
HCT: 38 % (ref 34.8–46.6)
HGB: 12.7 g/dL (ref 11.6–15.9)
LYMPH#: 3.6 10*3/uL — ABNORMAL HIGH (ref 0.9–3.3)
LYMPH%: 29.9 % (ref 14.0–48.0)
MCH: 29.1 pg (ref 26.0–34.0)
MCHC: 33.4 g/dL (ref 32.0–36.0)
MCV: 87 fL (ref 81–101)
MONO#: 0.7 10*3/uL (ref 0.1–0.9)
MONO%: 5.7 % (ref 0.0–13.0)
NEUT#: 7.4 10*3/uL — ABNORMAL HIGH (ref 1.5–6.5)
NEUT%: 61.7 % (ref 39.6–80.0)
Platelets: 308 10*3/uL (ref 145–400)
RBC: 4.36 10*6/uL (ref 3.70–5.32)
RDW: 15.1 % (ref 11.1–15.7)
WBC: 12 10*3/uL — ABNORMAL HIGH (ref 3.9–10.0)

## 2016-03-27 LAB — COMPREHENSIVE METABOLIC PANEL (CC13)
ALBUMIN: 3.9 g/dL (ref 3.5–5.5)
ALK PHOS: 96 IU/L (ref 39–117)
ALT: 18 IU/L (ref 0–32)
AST (SGOT): 17 IU/L (ref 0–40)
Albumin/Globulin Ratio: 1 — ABNORMAL LOW (ref 1.2–2.2)
BILIRUBIN TOTAL: 0.4 mg/dL (ref 0.0–1.2)
BUN / CREAT RATIO: 25 — AB (ref 9–23)
BUN: 17 mg/dL (ref 6–24)
CHLORIDE: 100 mmol/L (ref 96–106)
Calcium, Ser: 9.2 mg/dL (ref 8.7–10.2)
Carbon Dioxide, Total: 23 mmol/L (ref 18–29)
Creatinine, Ser: 0.69 mg/dL (ref 0.57–1.00)
GFR calc Af Amer: 112 mL/min/{1.73_m2} (ref >59)
GFR calc non Af Amer: 97 mL/min/{1.73_m2} (ref >59)
GLUCOSE: 185 mg/dL — AB (ref 65–99)
Globulin, Total: 3.8 g/dL (ref 1.5–4.5)
Potassium, Ser: 3.4 mmol/L — ABNORMAL LOW (ref 3.5–5.2)
Sodium: 129 mmol/L — ABNORMAL LOW (ref 134–144)
Total Protein: 7.7 g/dL (ref 6.0–8.5)

## 2016-03-27 LAB — LACTATE DEHYDROGENASE: LDH: 199 U/L (ref 125–245)

## 2016-03-27 MED ORDER — LANREOTIDE ACETATE 120 MG/0.5ML ~~LOC~~ SOLN
120.0000 mg | Freq: Once | SUBCUTANEOUS | Status: AC
  Administered 2016-03-27: 120 mg via SUBCUTANEOUS

## 2016-03-27 MED ORDER — LANREOTIDE ACETATE 120 MG/0.5ML ~~LOC~~ SOLN
SUBCUTANEOUS | Status: AC
  Filled 2016-03-27: qty 120

## 2016-03-27 NOTE — Patient Instructions (Signed)
Lanreotide injection What is this medicine? LANREOTIDE (lan REE oh tide) is used to reduce blood levels of growth hormone in patients with a condition called acromegaly. It also works to slow or stop tumor growth in patients with gastroenteropancreatic neuroendocrine tumor (GEP-NET). COMMON BRAND NAME(S): Somatuline [place] What should I tell my health care provider before I take this medicine? They need to know if you have any of these conditions: -diabetes -gallbladder disease -heart disease -kidney disease -liver disease -an unusual or allergic reaction to lanreotide, other medicines, latex, foods, dyes, or preservatives -pregnant or trying to get pregnant -breast-feeding How should I use this medicine? This medicine is for injection under the skin. It is given by a health care professional in a hospital or clinic setting. Contact your pediatrician or health care professional regarding the use of this medicine in children. Special care may be needed. What if I miss a dose? It is important not to miss your dose. Call your doctor or health care professional if you are unable to keep an appointment. What may interact with this medicine? -bromocriptine -cyclosporine -medicines for diabetes, including insulin -medicines for heart disease or hypertension -quinidine What should I watch for while using this medicine? Visit your doctor or health care professional for regular checks on your progress. Your condition will be monitored carefully while you are receiving this medicine. This medicine may cause increases or decreases in blood sugar. Signs of high blood sugar include frequent urination, unusual thirst, flushed or dry skin, difficulty breathing, drowsiness, stomach ache, nausea, vomiting or dry mouth. Signs of low blood sugar include chills, cool, pale skin or cold sweats, drowsiness, extreme hunger, fast heartbeat, headache, nausea, nervousness or anxiety, shakiness, trembling,  unsteadiness, tiredness, or weakness. Contact your doctor or health care professional right away if you experience any of these symptoms. What side effects may I notice from receiving this medicine? Side effects that you should report to your doctor or health care professional as soon as possible: -allergic reactions like skin rash, itching or hives, swelling of the face, lips, or tongue -changes in blood sugar -changes in heart rate -severe stomach pain Side effects that usually do not require medical attention (report to your doctor or health care professional if they continue or are bothersome): -diarrhea or constipation -gas or stomach pain -nausea, vomiting -pain, redness, swelling and irritation at site where injected Where should I keep my medicine? This drug is given in a hospital or clinic and will not be stored at home.  2017 Elsevier/Gold Standard (2013-01-26 17:43:04)

## 2016-03-27 NOTE — Progress Notes (Signed)
Hematology and Oncology Follow Up Visit  Leah [name] [id number] [birth year] 56 y.o. 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 She was seen by Dr. Jyl Heinz with [facility] for an incisional hernia repair. She was also found at that time to have a recurrent neuroendocrine liver metastasis. She underwent the hernia repair and also had an open liver ablation that same day. Postop, she had some acute kidney injury. This seemed to resolve.  She apparently had a biopsy done. I cannot find the pathology report in the record. However, I have to believe that this is neuroendocrine carcinoma consistent with carcinoid.  She does her Somatuline every 28 days. This works out well for her. When she needs her next dose of treatment, that's when she gets to have some of the diarrhea.  Thankfully, she has not had the flu.  She's not noted any issues with cough. She's had no nausea or vomiting. She's had no fever. She's had no rashes. She's had no bleeding.  Overall, her performance status is ECOG 1.  Medications:  Allergies as of 03/27/2016   No Known Allergies     Medication List       Accurate as of 03/27/16  2:26 PM. Always use your most recent med list.          acetaminophen 500 MG tablet Commonly known as:  TYLENOL Take 500 mg by mouth every 6 (six) hours as needed for mild pain or fever.   amLODipine 10 MG tablet Commonly known as:  NORVASC TAKE 1 TABLET (10 MG TOTAL) BY MOUTH DAILY.   aspirin 81 MG EC tablet TAKE 1 TABLET (81 MG TOTAL) BY MOUTH DAILY.   benzonatate 100 MG capsule Commonly known as:  TESSALON Take 1 capsule (100 mg total) by mouth 3 (three) times daily as needed for cough.   cephALEXin 500 MG capsule Commonly known as:  KEFLEX Take 1  capsule (500 mg total) by mouth 4 (four) times daily.   fexofenadine 180 MG tablet Commonly known as:  [employer] ALLERGY RELIEF Take 1 tablet (180 mg total) by mouth daily.   fluticasone 50 MCG/ACT nasal spray Commonly known as:  FLONASE USE 2 SPRAYS IN BOTH NOSTRILS EVERY DAY   gabapentin 300 MG capsule Commonly known as:  NEURONTIN Take 300 mg by mouth 3 (three) times daily.   metoprolol succinate 100 MG 24 hr tablet Commonly known as:  TOPROL-XL TAKE 1 TABLET BY MOUTH EVERY DAY WITH OR IMMEDIATELY FOLLOWING A MEAL   montelukast 10 MG tablet Commonly known as:  SINGULAIR Take 1 tablet (10 mg total) by mouth at bedtime.   naproxen 500 MG tablet Commonly known as:  NAPROSYN Take 500 mg by mouth 2 (two) times daily with a meal.   octreotide 30 MG injection Commonly known as:  SANDOSTATIN LAR Inject 30 mg into the muscle every 28 (twenty-eight) days.   ondansetron 4 MG disintegrating tablet Commonly known as:  ZOFRAN-ODT TAKE 1 TABLET BY MOUTH EVERY 8 HOURS AS NEEDED FOR NAUSEA OR VOMITING.   oseltamivir 75 MG capsule Commonly known as:  TAMIFLU Take 1 capsule (75 mg total) by mouth every 12 (twelve) hours.   oxyCODONE 5 MG immediate release tablet Commonly known as:  Oxy IR/ROXICODONE Take 5 mg by mouth every 6 (six)  hours as needed for moderate pain or severe pain.   pantoprazole 40 MG tablet Commonly known as:  PROTONIX TAKE 1 TABLET (40 MG TOTAL) BY MOUTH DAILY.   PROAIR HFA 108 (90 Base) MCG/ACT inhaler Generic drug:  albuterol INHALE 2 PUFFS INTO THE LUNGS EVERY 6 (SIX) HOURS AS NEEDED. FOR SHORTNESS OF BREATH.   valACYclovir 500 MG tablet Commonly known as:  VALTREX Take 500 mg by mouth 2 (two) times daily as needed. Cold sores   valsartan 320 MG tablet Commonly known as:  DIOVAN TAKE 1 TABLET (320 MG TOTAL) BY MOUTH DAILY.   venlafaxine XR 150 MG 24 hr capsule Commonly known as:  EFFEXOR-XR TAKE 1 CAPSULE (150 MG TOTAL) BY MOUTH DAILY.       Allergies:  No Known Allergies  Past Medical History, Surgical history, Social history, and Family History were reviewed and updated.  Review of Systems: All other 10 point review of systems is negative.   Physical Exam:  weight is 226 lb 8 oz (102.7 kg). Her oral temperature is 98.6 F (37 C). Her blood pressure is 139/86 and her pulse is 98.   Wt Readings from Last 3 Encounters:  03/27/16 226 lb 8 oz (102.7 kg)  01/31/16 231 lb (104.8 kg)  01/17/16 240 lb (108.9 kg)    Well-developed and well-nourished white female. She is somewhat obese. Head and neck exam shows no ocular or oral lesions. There are no palpable cervical or supraclavicular lymph nodes. Lungs are clear. Cardiac exam regular rate and rhythm with no murmurs, rubs or bruits. Abdomen is soft. She has healed laparotomy scar. There is no fluid wave. There is no abdominal mass. There is no palpable liver or spleen tip. Back exam shows no tenderness over the spine, ribs or hips. Extremities shows no clubbing, cyanosis or edema. Neurological exam shows no focal neurological deficits. Skin exam shows no rashes, ecchymoses or petechia.   Lab Results  Component Value Date   WBC 12.0 (H) 03/27/2016   HGB 12.7 03/27/2016   HCT 38.0 03/27/2016   MCV 87 03/27/2016   PLT 308 03/27/2016   Lab Results  Component Value Date   FERRITIN 382 (H) 10/18/2008   IRON <10 (L) 10/18/2008   TIBC NOT CALC Not calculated due to Iron <10. 10/18/2008   UIBC 156 10/18/2008   IRONPCTSAT NOT CALC Not calculated due to Iron <10. 10/18/2008   Lab Results  Component Value Date   RETICCTPCT 0.9 10/18/2008   RBC 4.36 03/27/2016   No results found for: KPAFRELGTCHN, LAMBDASER, KAPLAMBRATIO No results found for: IGGSERUM, IGA, IGMSERUM No results found for: Odetta Pink, SPEI   Chemistry      Component Value Date/Time   NA 137 01/31/2016 2354   NA 141 01/17/2016 1047   K 3.7 01/31/2016 2354   K 3.2 (L)  01/17/2016 1047   CL 107 01/31/2016 2354   CL 105 09/05/2015 1112   CO2 22 01/31/2016 2354   CO2 27 01/17/2016 1047   BUN 21 (H) 01/31/2016 2354   BUN 6.6 (L) 01/17/2016 1047   CREATININE 0.96 01/31/2016 2354   CREATININE 0.7 01/17/2016 1047      Component Value Date/Time   CALCIUM 8.8 (L) 01/31/2016 2354   CALCIUM 8.7 01/17/2016 1047   ALKPHOS 84 01/31/2016 2354   ALKPHOS 90 01/17/2016 1047   AST 19 01/31/2016 2354   AST 34 01/17/2016 1047   ALT 21 01/31/2016 2354   ALT 38  01/17/2016 1047   BILITOT 0.6 01/31/2016 2354   BILITOT 0.42 01/17/2016 1047     Impression and Plan: Ms. Hinkle is 56 year old white female. She has a long-standing history of carcinoid. She recently underwent ablation while she is getting hernia repair. I thought this was a very great idea by Dr. Crisoforo Oxford. I'm glad that he was able to do the procedure while she was having her hernia repair. I know this will help her out.   From what Ms. Seales says, Mina Marble will be doing MRIs or CT scans every 3 months for the next year. Then they will go every 6 month with rheologic studies.   We will continue to follow her along monthly for her Somatuline.  I will see her back in 2 more months.   I spent about 25 minutes with her and her husband.   [name], MD 2/15/20182:26 PM

## 2016-03-28 LAB — CHROMOGRANIN A: Chromogranin A: 3 nmol/L (ref 0–5)

## 2016-03-28 NOTE — Telephone Encounter (Signed)
Ok to send odansetron refill. Due for follow up.

## 2016-03-28 NOTE — Telephone Encounter (Signed)
Leah [name] 09/19/15 and has no future appts scheduled. When should pt follow up in the office? Also, please advise ondansetron request?

## 2016-03-31 NOTE — Telephone Encounter (Signed)
Refill sent, please contact pt to schedule appt. Thanks!

## 2016-04-02 NOTE — Telephone Encounter (Signed)
Called pt. She said that she has a lot of appt and a lot happening but she will give our office a call back to schedule her F/U appt.

## 2016-04-11 ENCOUNTER — Ambulatory Visit (INDEPENDENT_AMBULATORY_CARE_PROVIDER_SITE_OTHER): Payer: Managed Care, Other (non HMO) | Admitting: Family Medicine

## 2016-04-11 ENCOUNTER — Encounter: Payer: Self-pay | Admitting: Family Medicine

## 2016-04-11 VITALS — BP 128/84 | HR 81 | Temp 98.5°F | Ht 59.25 in | Wt 227.6 lb

## 2016-04-11 DIAGNOSIS — K7682 Hepatic encephalopathy: Secondary | ICD-10-CM

## 2016-04-11 DIAGNOSIS — I10 Essential (primary) hypertension: Secondary | ICD-10-CM | POA: Diagnosis not present

## 2016-04-11 DIAGNOSIS — E3400 Carcinoid syndrome, unspecified: Secondary | ICD-10-CM

## 2016-04-11 DIAGNOSIS — F419 Anxiety disorder, unspecified: Secondary | ICD-10-CM

## 2016-04-11 DIAGNOSIS — E119 Type 2 diabetes mellitus without complications: Secondary | ICD-10-CM

## 2016-04-11 DIAGNOSIS — J301 Allergic rhinitis due to pollen: Secondary | ICD-10-CM

## 2016-04-11 DIAGNOSIS — M4722 Other spondylosis with radiculopathy, cervical region: Secondary | ICD-10-CM

## 2016-04-11 DIAGNOSIS — K7290 Hepatic failure, unspecified without coma: Secondary | ICD-10-CM

## 2016-04-11 DIAGNOSIS — N952 Postmenopausal atrophic vaginitis: Secondary | ICD-10-CM

## 2016-04-11 DIAGNOSIS — C7B8 Other secondary neuroendocrine tumors: Secondary | ICD-10-CM

## 2016-04-11 DIAGNOSIS — K219 Gastro-esophageal reflux disease without esophagitis: Secondary | ICD-10-CM | POA: Diagnosis not present

## 2016-04-11 DIAGNOSIS — D869 Sarcoidosis, unspecified: Secondary | ICD-10-CM | POA: Diagnosis not present

## 2016-04-11 LAB — POCT GLYCOSYLATED HEMOGLOBIN (HGB A1C): Hemoglobin A1C: 6.9

## 2016-04-11 MED ORDER — NAPROXEN 500 MG PO TABS: 500.0000 mg | ORAL_TABLET | Freq: Two times a day (BID) | ORAL | 2 refills | Status: DC

## 2016-04-11 MED ORDER — OXYCODONE HCL 5 MG PO TABS: 5.0000 mg | ORAL_TABLET | ORAL | 0 refills | Status: DC | PRN

## 2016-04-11 MED ORDER — GABAPENTIN 100 MG PO CAPS: 100.0000 mg | ORAL_CAPSULE | Freq: Every day | ORAL | 3 refills | Status: DC

## 2016-04-11 NOTE — Progress Notes (Signed)
Pre visit review using our clinic review tool, if applicable. No additional management support is needed unless otherwise documented below in the visit note. 

## 2016-04-12 ENCOUNTER — Encounter: Payer: Self-pay | Admitting: Family Medicine

## 2016-04-12 DIAGNOSIS — K7290 Hepatic failure, unspecified without coma: Secondary | ICD-10-CM | POA: Insufficient documentation

## 2016-04-12 DIAGNOSIS — N952 Postmenopausal atrophic vaginitis: Secondary | ICD-10-CM | POA: Insufficient documentation

## 2016-04-12 DIAGNOSIS — K7682 Hepatic encephalopathy: Secondary | ICD-10-CM | POA: Insufficient documentation

## 2016-04-12 NOTE — Progress Notes (Addendum)
Leah [name] is a 56 y.o. female is here to [facility].   History of Present Illness:   1. Osteoarthritis of spine with radiculopathy, cervical region. Hx of DDD. Pain radiates to UE bilaterally. Some n/t bilaterally, intermittently. Taking Naproxen daily. Previously on Neurontin, which helped. Wants to restart. Pain sometimes inhibits sleep.      2. Controlled type 2 diabetes mellitus without complication, without long-term current use of insulin ([place]). Current symptoms: No polyuria, polydipsia, blurry vision, chest pain, dyspnea or claudication.  No foot burning, numbness or pain. She has never been on medication.   On ACE inhibitor or angiotensin II receptor blocker?  Yes On Aspirin? Yes  Current diet: in general, a "healthy" diet   Current exercise: aerobics and gentle water aerobics   Wt Readings from Last 3 Encounters:  04/11/16 227 lb 9.6 oz (103.2 kg)  03/27/16 226 lb 8 oz (102.7 kg)  01/31/16 231 lb (104.8 kg)     PMHx, SurgHx, SocialHx, Medications, and Allergies were reviewed in the Visit Navigator and updated as appropriate.   Patient Active Problem List   Diagnosis Date Noted  . Atrophic vaginitis 04/12/2016  . Hepatic encephalopathy ([place]) 04/12/2016  . Osteopenia 09/27/2015  . Branch retinal vein occlusion of left eye 12/12/2014  . Hypertensive retinopathy of both eyes 12/12/2014  . Lung nodule seen on imaging study 05/01/2014  . Diabetes mellitus type 2, controlled ([place]) 03/17/2013  . Morbid obesity ([place]) 03/01/2013  . Carcinoid syndrome ([place]) 02/21/2013  . Retinopathy 01/18/2013  . 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 Malignant neoplasm of breast ([place]) 01/08/2011  . 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 Hypertension 10/22/2010  . GERD (gastroesophageal reflux disease) 10/22/2010  . Allergic rhinitis 10/22/2010  . History of bilateral mastectomy 10/22/2010  . Sarcoidosis ([place]) 10/22/2010  . Osteoarthritis 10/22/2010  .  Anxiety 10/22/2010    Past Surgical History:  Procedure Laterality Date  . ABDOMINAL HYSTERECTOMY  2004  . BREAST SURGERY     bilateral masectomy and reconstruction with silicone implants  . CESAREAN SECTION  [id number]  . CHOLECYSTECTOMY  2006  . HEPATETOMY    . KNEE ARTHROSCOPY Left 09/2010  . LATISSIMUS DORSI GRAFT  2008   used for reconstructive breast surgery  . OOPHORECTOMY  2004  . TONSILLECTOMY  1976    Family History  Problem Relation Age of Onset  . Arthritis Maternal Grandmother   . Arthritis Maternal Grandfather   . Arthritis Paternal Grandmother   . Arthritis Paternal Grandfather   . Heart failure Paternal Grandfather   . Breast cancer Mother 65    died at 43  . Lupus Mother   . Heart failure Father   . Hypertension Father   . Hyperlipidemia Father   . Breast cancer Maternal Aunt 38    deceased at 68 recurrence  . Ovarian cancer Maternal Aunt 6     died with in 54 months  . Breast cancer Maternal Aunt 44    died at age 68    Social History  Substance Use Topics  . Smoking status: Never Smoker  . Smokeless tobacco: Never Used     Comment: never used tobacco  . Alcohol use No    Current Medications and Allergies:    Current Outpatient Prescriptions:  .  amLODipine (NORVASC) 10 MG tablet, TAKE 1 TABLET (10 MG TOTAL) BY MOUTH DAILY. (Patient taking differently: Take 10 mg by mouth at bedtime. ), Disp: 90 tablet, Rfl: 0 .  aspirin 81 MG EC tablet, TAKE 1 TABLET (81 MG TOTAL) BY MOUTH DAILY., Disp: 90 tablet, Rfl: 0 .  benzonatate (TESSALON) 100 MG capsule, Take 1 capsule (100 mg total) by mouth 3 (three) times daily as needed for cough., Disp: 20 capsule, Rfl: 0 .  fexofenadine ([employer] ALLERGY RELIEF) 180 MG tablet, Take 1 tablet (180 mg total) by mouth daily. (Patient taking differently: Take 180 mg by mouth every morning. ), Disp: 90 tablet, Rfl: 3 .  fluticasone (FLONASE) 50 MCG/ACT nasal spray, USE 2 SPRAYS IN BOTH NOSTRILS EVERY DAY, Disp: 16 g, Rfl:  2 .  metoprolol succinate (TOPROL-XL) 100 MG 24 hr tablet, TAKE 1 TABLET BY MOUTH EVERY DAY WITH OR IMMEDIATELY FOLLOWING A MEAL (Patient taking differently: TAKE 100 MG MOUTH EACH NIGHT WITH OR IMMEDIATELY FOLLOWING A MEAL), Disp: 90 tablet, Rfl: 0 .  montelukast (SINGULAIR) 10 MG tablet, Take 1 tablet (10 mg total) by mouth at bedtime., Disp: 90 tablet, Rfl: 0 .  naproxen (NAPROSYN) 500 MG tablet, Take 1 tablet (500 mg total) by mouth 2 (two) times daily with a meal., Disp: 180 tablet, Rfl: 2 .  octreotide (SANDOSTATIN LAR) 30 MG injection, Inject 30 mg into the muscle every 28 (twenty-eight) days., Disp: , Rfl:  .  ondansetron (ZOFRAN-ODT) 4 MG disintegrating tablet, TAKE 1 TABLET BY MOUTH EVERY 8 HOURS AS NEEDED FOR NAUSEA OR VOMITING., Disp: 20 tablet, Rfl: 0 .  pantoprazole (PROTONIX) 40 MG tablet, TAKE 1 TABLET (40 MG TOTAL) BY MOUTH DAILY. (Patient taking differently: TAKE 1 TABLET (40 MG TOTAL) BY MOUTH TWICE DAILY), Disp: 90 tablet, Rfl: 1 .  PROAIR HFA 108 (90 Base) MCG/ACT inhaler, INHALE 2 PUFFS INTO THE LUNGS EVERY 6 (SIX) HOURS AS NEEDED. FOR SHORTNESS OF BREATH., Disp: 6.7 Inhaler, Rfl: 4 .  valACYclovir (VALTREX) 500 MG tablet, Take 500 mg by mouth 2 (two) times daily as needed. Cold sores, Disp: , Rfl:  .  valsartan (DIOVAN) 320 MG tablet, TAKE 1 TABLET (320 MG TOTAL) BY MOUTH DAILY., Disp: 90 tablet, Rfl: 1 .  venlafaxine XR (EFFEXOR-XR) 150 MG 24 hr capsule, TAKE 1 CAPSULE (150 MG TOTAL) BY MOUTH DAILY. (Patient taking differently: TAKE 1 CAPSULE (150 MG TOTAL) BY MOUTH EACH MORNING), Disp: 90 capsule, Rfl: 0  No Known Allergies    Review of Systems:   Review of Systems: The patient denies chills, fever, weight loss/gain, fatigue, lack of appetite, difficulty swallowing, sore throat, earache, post-nasal drip, chest pain, palpitations, changes in blood pressures, swelling of legs, cough, dyspnea, wheezing, change in bowel habits, nausea, vomiting, changes in urination, skin  changes, dizziness, headaches, numbness, changes in balance or coordination, anxiety, depression, memory changes, swollen glands, easy bruising.    Vitals:   Vitals:   04/11/16 1404  BP: 128/84  Pulse: 81  Temp: 98.5 F (36.9 C)  TempSrc: Oral  SpO2: 97%  Weight: 227 lb 9.6 oz (103.2 kg)  Height: 4' 11.25" (1.505 m)     Body mass index is 45.58 kg/m.   Physical Exam:   General: Alert, cooperative, appears stated age and no distress.  HEENT:  Normocephalic, without obvious abnormality, atraumatic. Conjunctivae/corneas clear. PERRL, EOM's intact. Normal TM's and external ear canals both ears. Nares normal. Septum midline. Mucosa normal. No drainage or sinus tenderness. Lips, mucosa, and tongue normal; teeth and gums normal.  Lungs: Clear to auscultation bilaterally.  Heart:: Regular rate and rhythm, S1, S2 normal, no murmur, click, rub or gallop.  Abdomen: Soft, non-tender; bowel sounds  normal; no masses,  no organomegaly.  Extremities: Extremities normal, atraumatic, no cyanosis or edema.  Pulses: 2+ and symmetric.  Skin: Skin color, texture, turgor normal. No rashes or lesions.  Neurologic: Alert and oriented X 3, normal strength and tone. Normal symmetric. reflexes. Normal coordination and gait.  Psych: Alert,oriented, in NAD with a full range of affect, normal behavior and no psychotic features  Msk:  Decreased lordosis cervical spine. Some pain with extension. FROM [facility].    Results for orders placed or performed in visit on 04/11/16  POCT glycosylated hemoglobin (Hb A1C)  Result Value Ref Range   Hemoglobin A1C 6.9      Assessment and Plan:    Kourtnie was seen today for establish care, neck pain and diabetes.  Diagnoses and all orders for this visit:  Osteoarthritis of spine with radiculopathy, cervical region Comments: Will add Neurontin, low with instructions to increase to TID dosing slowly.  Orders: -     naproxen (NAPROSYN) 500 MG tablet; Take 1 tablet (500 mg  total) by mouth 2 (two) times daily with a meal. -     gabapentin (NEURONTIN) 100 MG capsule; Take 1 capsule (100 mg total) by mouth at bedtime. -     oxyCODONE (ROXICODONE) 5 MG immediate release tablet; Take 1 tablet (5 mg total) by mouth every 4 (four) hours as needed for severe pain.  Controlled type 2 diabetes mellitus without complication, without long-term current use of insulin ([facility]) Comments: A1c stable. Will review with patient - discuss weight loss through diet, consider medication. If she has not had DM education, will refer.  6.9 6.7 6.8 6.6   Orders: -     POCT glycosylated hemoglobin (Hb A1C)  Chronic seasonal allergic rhinitis due to pollen Comments: Well controlled.  No signs of complications, medication side effects, or red flags.  Continue current regimen.    Essential hypertension Comments: Well controlled.  No signs of complications, medication side effects, or red flags.  Continue current regimen.    Gastroesophageal reflux disease without esophagitis Comments: Well controlled.  No signs of complications, medication side effects, or red flags.  Continue current regimen.    Anxiety Comments: Well controlled.  No signs of complications, medication side effects, or red flags.  Continue current regimen.    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   Carcinoid syndrome ([place]) Comments: Doing well with current treatment.   Sarcoidosis ([place]) Comments: No formal Dx. Suspected Hx per patient.  Morbid obesity ([place]) Comments: The patient is asked to make an attempt to improve diet and exercise patterns to aid in medical management of this problem.   . Reviewed expectations re: course of current medical issues. . Discussed self-management of symptoms. . Outlined signs and symptoms indicating need for more acute intervention. . Patient verbalized understanding and all questions were answered. . See orders for this  visit as documented in the electronic medical record. . Patient received an After Visit Summary.  Records requested if needed. I spent 45 minutes with this patient, greater than 50% was face-to-face time counseling regarding the above diagnoses.    Briscoe Deutscher, [place], [place] 04/12/2016   Follow-up: PHYSICAL  Meds ordered this encounter  Medications  . naproxen (NAPROSYN) 500 MG tablet    Sig: Take 1 tablet (500 mg total) by mouth 2 (two) times daily with a meal.    Dispense:  180 tablet    Refill:  2  . gabapentin (NEURONTIN) 100  MG capsule    Sig: Take 1 capsule (100 mg total) by mouth at bedtime.    Dispense:  90 capsule    Refill:  3  . oxyCODONE (ROXICODONE) 5 MG immediate release tablet    Sig: Take 1 tablet (5 mg total) by mouth every 4 (four) hours as needed for severe pain.    Dispense:  30 tablet    Refill:  0   Medications Discontinued During This Encounter  Medication Reason  . acetaminophen (TYLENOL) 500 MG tablet Error  . cephALEXin (KEFLEX) 500 MG capsule Error  . oseltamivir (TAMIFLU) 75 MG capsule Error  . oxyCODONE (OXY IR/ROXICODONE) 5 MG immediate release tablet Error  . gabapentin (NEURONTIN) 300 MG capsule   . naproxen (NAPROSYN) 500 MG tablet Reorder   Orders Placed This Encounter  Procedures  . POCT glycosylated hemoglobin (Hb A1C)

## 2016-04-15 ENCOUNTER — Other Ambulatory Visit: Payer: Self-pay

## 2016-04-16 ENCOUNTER — Ambulatory Visit: Payer: Managed Care, Other (non HMO)

## 2016-04-23 ENCOUNTER — Ambulatory Visit (HOSPITAL_BASED_OUTPATIENT_CLINIC_OR_DEPARTMENT_OTHER): Payer: 59

## 2016-04-23 VITALS — BP 147/85 | HR 85 | Temp 98.0°F | Resp 20

## 2016-04-23 DIAGNOSIS — C7B8 Other secondary neuroendocrine tumors: Secondary | ICD-10-CM

## 2016-04-23 DIAGNOSIS — C7A00 Malignant carcinoid tumor of unspecified site: Secondary | ICD-10-CM

## 2016-04-23 DIAGNOSIS — C7B02 Secondary carcinoid tumors of liver: Secondary | ICD-10-CM

## 2016-04-23 MED ORDER — LANREOTIDE ACETATE 120 MG/0.5ML ~~LOC~~ SOLN
SUBCUTANEOUS | Status: AC
  Filled 2016-04-23: qty 120

## 2016-04-23 MED ORDER — LANREOTIDE ACETATE 120 MG/0.5ML ~~LOC~~ SOLN
120.0000 mg | Freq: Once | SUBCUTANEOUS | Status: AC
  Administered 2016-04-23: 120 mg via SUBCUTANEOUS

## 2016-04-23 NOTE — Patient Instructions (Signed)
Lanreotide injection What is this medicine? LANREOTIDE (lan REE oh tide) is used to reduce blood levels of growth hormone in patients with a condition called acromegaly. It also works to slow or stop tumor growth in patients with neuroendocrine tumors and treat carcinoid syndrome. This medicine may be used for other purposes; ask your health care provider or pharmacist if you have questions. COMMON BRAND NAME(S): Somatuline [place] What should I tell my health care provider before I take this medicine? They need to know if you have any of these conditions: -diabetes -gallbladder disease -heart disease -kidney disease -liver disease -thyroid disease -an unusual or allergic reaction to lanreotide, other medicines, foods, dyes, or preservatives -pregnant or trying to get pregnant -breast-feeding How should I use this medicine? This medicine is for injection under the skin. It is given by a health care professional in a hospital or clinic setting. Contact your pediatrician or health care professional regarding the use of this medicine in children. Special care may be needed. Overdosage: If you think you have taken too much of this medicine contact a poison control center or emergency room at once. NOTE: This medicine is only for you. Do not share this medicine with others. What if I miss a dose? It is important not to miss your dose. Call your doctor or health care professional if you are unable to keep an appointment. What may interact with this medicine? This medicine may interact with the following medications: -bromocriptine -cyclosporine -certain medicines for blood pressure, heart disease, irregular heart beat -certain medicines for diabetes -quinidine -terfenadine This list may not describe all possible interactions. Give your health care provider a list of all the medicines, herbs, non-prescription drugs, or dietary supplements you use. Also tell them if you smoke, drink alcohol, or  use illegal drugs. Some items may interact with your medicine. What should I watch for while using this medicine? Tell your doctor or healthcare professional if your symptoms do not start to get better or if they get worse. Visit your doctor or health care professional for regular checks on your progress. Your condition will be monitored carefully while you are receiving this medicine. You may need blood work done while you are taking this medicine. Women should inform their doctor if they wish to become pregnant or think they might be pregnant. There is a potential for serious side effects to an unborn child. Talk to your health care professional or pharmacist for more information. Do not breast-feed an infant while taking this medicine or for 6 months after stopping it. This medicine has caused ovarian failure in some women. This medicine may interfere with the ability to have a child. Talk with your doctor or health care professional if you are concerned about your fertility. What side effects may I notice from receiving this medicine? Side effects that you should report to your doctor or health care professional as soon as possible: -allergic reactions like skin rash, itching or hives, swelling of the face, lips, or tongue -increased blood pressure -severe stomach pain -signs and symptoms of high blood sugar such as dizziness; dry mouth; dry skin; fruity breath; nausea; stomach pain; increased hunger or thirst; increased urination -signs and symptoms of low blood sugar such as feeling anxious; confusion; dizziness; increased hunger; unusually weak or tired; sweating; shakiness; cold; irritable; headache; blurred vision; fast heartbeat; loss of consciousness -unusually slow heartbeat Side effects that usually do not require medical attention (report to your doctor or health care professional if they continue   or are bothersome): -constipation -diarrhea -dizziness -headache -muscle pain -muscle  spasms -nausea -pain, redness, or irritation at site where injected This list may not describe all possible side effects. Call your doctor for medical advice about side effects. You may report side effects to FDA at 1-800-FDA-1088. Where should I keep my medicine? This drug is given in a hospital or clinic and will not be stored at home. NOTE: This sheet is a summary. It may not cover all possible information. If you have questions about this medicine, talk to your doctor, pharmacist, or health care provider.  2018 Elsevier/Gold Standard (2015-11-02 10:33:47)  

## 2016-05-01 ENCOUNTER — Encounter: Payer: Self-pay | Admitting: Family Medicine

## 2016-05-01 ENCOUNTER — Ambulatory Visit (INDEPENDENT_AMBULATORY_CARE_PROVIDER_SITE_OTHER): Payer: 59 | Admitting: Family Medicine

## 2016-05-01 VITALS — BP 114/82 | HR 77 | Temp 98.5°F | Ht 59.25 in | Wt 229.2 lb

## 2016-05-01 DIAGNOSIS — F419 Anxiety disorder, unspecified: Secondary | ICD-10-CM

## 2016-05-01 DIAGNOSIS — J301 Allergic rhinitis due to pollen: Secondary | ICD-10-CM

## 2016-05-01 DIAGNOSIS — I10 Essential (primary) hypertension: Secondary | ICD-10-CM | POA: Diagnosis not present

## 2016-05-01 DIAGNOSIS — Z0000 Encounter for general adult medical examination without abnormal findings: Secondary | ICD-10-CM | POA: Diagnosis not present

## 2016-05-01 DIAGNOSIS — N641 Fat necrosis of breast: Secondary | ICD-10-CM

## 2016-05-01 DIAGNOSIS — H6123 Impacted cerumen, bilateral: Secondary | ICD-10-CM

## 2016-05-01 MED ORDER — ASPIRIN 81 MG PO TBEC: 81.0000 mg | DELAYED_RELEASE_TABLET | Freq: Every day | ORAL | 0 refills | Status: DC

## 2016-05-01 MED ORDER — METOPROLOL SUCCINATE ER 100 MG PO TB24: ORAL_TABLET | ORAL | 0 refills | Status: DC

## 2016-05-01 MED ORDER — ONDANSETRON 4 MG PO TBDP: ORAL_TABLET | ORAL | 0 refills | Status: DC

## 2016-05-01 MED ORDER — FEXOFENADINE HCL 180 MG PO TABS: 180.0000 mg | ORAL_TABLET | ORAL | 3 refills | Status: DC

## 2016-05-01 MED ORDER — MONTELUKAST SODIUM 10 MG PO TABS: 10.0000 mg | ORAL_TABLET | Freq: Every day | ORAL | 0 refills | Status: DC

## 2016-05-01 MED ORDER — VENLAFAXINE HCL ER 150 MG PO CP24: ORAL_CAPSULE | ORAL | 3 refills | Status: DC

## 2016-05-01 MED ORDER — AMLODIPINE BESYLATE 10 MG PO TABS
10.0000 mg | ORAL_TABLET | Freq: Every day | ORAL | 3 refills | Status: DC
Start: 2016-05-01 — End: 2016-06-25

## 2016-05-01 NOTE — Patient Instructions (Signed)
It was great to see you today!  Call your insurance company to see if they will cover Shingrix. You can come back to have it done - as a nurse visit.

## 2016-05-01 NOTE — Progress Notes (Signed)
Pre visit review using our clinic review tool, if applicable. No additional management support is needed unless otherwise documented below in the visit note. 

## 2016-05-01 NOTE — Progress Notes (Signed)
Subjective:    Leah [name] is a 56 y.o. female and is here for a comprehensive physical exam.  HPI: No complaints.  There are no preventive care reminders to display for this patient.  PMHx, SurgHx, SocialHx, Medications, and Allergies were reviewed in the Visit Navigator and updated as appropriate.    Patient Active Problem List   Diagnosis Date Noted  . Atrophic vaginitis 04/12/2016  . Hepatic encephalopathy ([place]) 04/12/2016  . Osteopenia 09/27/2015  . Branch retinal vein occlusion of left eye 12/12/2014  . Hypertensive retinopathy of both eyes 12/12/2014  . Lung nodule seen on imaging study 05/01/2014  . Diabetes mellitus type 2, controlled ([place]) 03/17/2013  . Morbid obesity ([place]) 03/01/2013  . Carcinoid syndrome ([place]) 02/21/2013  . Retinopathy 01/18/2013  . 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 Malignant neoplasm of breast ([place]) 01/08/2011  . 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 Hypertension 10/22/2010  . GERD (gastroesophageal reflux disease) 10/22/2010  . Allergic rhinitis 10/22/2010  . History of bilateral mastectomy 10/22/2010  . Sarcoidosis ([place]) 10/22/2010  . Osteoarthritis 10/22/2010  . Anxiety 10/22/2010   Social History  Substance Use Topics  . Smoking status: Never Smoker  . Smokeless tobacco: Never Used  . Alcohol use No   Review of Systems:   Review of Systems  Constitutional: Negative for chills, fever and malaise/fatigue.  HENT: Negative for congestion, ear pain, sinus pain and sore throat.   Eyes: Negative for blurred vision, double vision and pain.  Respiratory: Negative for cough, shortness of breath and wheezing.   Cardiovascular: Negative for chest pain, palpitations and leg swelling.  Gastrointestinal: Negative for abdominal pain, constipation, diarrhea and vomiting.  Genitourinary: Negative for dysuria and frequency.  Musculoskeletal: Positive for neck pain.       Chronic neck pain.  Skin: Negative for itching  and rash.  Neurological: Negative for dizziness, weakness and headaches.  Psychiatric/Behavioral: Negative for depression and memory loss.    Objective:   Vitals:   05/01/16 1012  BP: 114/82  Pulse: 77  Temp: 98.5 F (36.9 C)   Body mass index is 45.9 kg/m.  General Appearance:  Alert, cooperative, no distress, appears stated age  Head:  Normocephalic, without obvious abnormality, atraumatic  Eyes:  PERRL, conjunctiva/corneas clear, EOM's intact, fundi benign, both eyes       Ears:  Normal TM's and external ear canals, both ears with cerumen.  Nose: Nares normal, septum midline, mucosa normal, no drainage    or sinus tenderness  Throat: Lips, mucosa, and tongue normal; teeth and gums normal  Neck: Supple, symmetrical, trachea midline, no adenopathy; thyroid:  No enlargement/tenderness/nodules; no carotit bruit or JVD  Back:   Symmetric, no curvature, ROM normal, no CVA tenderness  Lungs:   Clear to auscultation bilaterally, respirations unlabored  Chest wall:  No tenderness or deformity  Heart:  Regular rate and rhythm, S1 and S2 normal, no murmur, rub   or gallop  Abdomen:   Soft, non-tender, bowel sounds active all four quadrants, no masses, no organomegaly  Extremities: Extremities normal, atraumatic, no cyanosis or edema  Skin: Skin color, texture, turgor normal.  Lymph nodes: Cervical, supraclavicular, and axillary nodes normal  Neurologic: CNII-XII grossly intact. Normal strength, sensation and reflexes throughout    Assessment/Plan:   Leah [name] was seen today for annual exam.  Diagnoses and all orders for this visit:  Routine physical examination  Essential hypertension -     amLODipine (NORVASC) 10 MG tablet; Take 1 tablet (  10 mg total) by mouth at bedtime. -     aspirin 81 MG EC tablet; Take 1 tablet (81 mg total) by mouth daily. -     metoprolol succinate (TOPROL-XL) 100 MG 24 hr tablet; TAKE 100 MG MOUTH EACH NIGHT WITH OR IMMEDIATELY FOLLOWING A MEAL  Chronic  seasonal allergic rhinitis due to pollen -     fexofenadine ([employer]S ALLERGY RELIEF) 180 MG tablet; Take 1 tablet (180 mg total) by mouth every morning. -     montelukast (SINGULAIR) 10 MG tablet; Take 1 tablet (10 mg total) by mouth at bedtime.  Anxiety -     venlafaxine XR (EFFEXOR-XR) 150 MG 24 hr capsule; TAKE 1 CAPSULE (150 MG TOTAL) BY MOUTH EACH MORNING  Bilateral impacted cerumen Comments: Ear lavage in usual manner without complication.  Breast, fat necrosis -     MM DIAG BREAST TOMO BILATERAL; Future -     US BREAST LTD UNI RIGHT INC AXILLA; Future -     US BREAST LTD UNI LEFT INC AXILLA; Future   Well Adult Exam: Labs ordered: No. Patient counseling was done. See below for items discussed. Discussed the patient's BMI.  The BMI BMI is not in the acceptable range; BMI management plan is completed Follow up in 3 months.  Patient Counseling: [x]   Nutrition: Stressed importance of moderation in sodium/caffeine intake, saturated fat and cholesterol, caloric balance, sufficient intake of fresh fruits, vegetables, and fiber.  [x]   Stressed the importance of regular exercise.   []   Substance Abuse: Discussed cessation/primary prevention of tobacco, alcohol, or other drug use; driving or other dangerous activities under the influence; availability of treatment for abuse.   [x]   Injury prevention: Discussed safety belts, safety helmets, smoke detector, smoking near bedding or upholstery.   []   Sexuality: Discussed sexually transmitted diseases, partner selection, use of condoms, avoidance of unintended pregnancy  and contraceptive alternatives.   [x]   Dental health: Discussed importance of regular tooth brushing, flossing, and dental visits.  [x]   Health maintenance and immunizations reviewed. Please refer to Health maintenance section.   CMA served as Education administrator during this visit. History, Physical, and Plan performed by medical provider. Documentation and orders reviewed and attested to. Briscoe Deutscher, D.O.  Briscoe Deutscher, DO [place]

## 2016-05-02 ENCOUNTER — Other Ambulatory Visit: Payer: Self-pay | Admitting: Family Medicine

## 2016-05-02 DIAGNOSIS — Z853 Personal history of malignant neoplasm of breast: Secondary | ICD-10-CM

## 2016-05-02 DIAGNOSIS — Z9882 Breast implant status: Secondary | ICD-10-CM

## 2016-05-02 DIAGNOSIS — N641 Fat necrosis of breast: Secondary | ICD-10-CM

## 2016-05-21 ENCOUNTER — Encounter: Payer: Self-pay | Admitting: Family Medicine

## 2016-05-21 ENCOUNTER — Ambulatory Visit (INDEPENDENT_AMBULATORY_CARE_PROVIDER_SITE_OTHER): Payer: 59 | Admitting: Family Medicine

## 2016-05-21 VITALS — BP 116/78 | HR 95 | Temp 98.4°F | Wt 229.2 lb

## 2016-05-21 DIAGNOSIS — J40 Bronchitis, not specified as acute or chronic: Secondary | ICD-10-CM

## 2016-05-21 DIAGNOSIS — H6503 Acute serous otitis media, bilateral: Secondary | ICD-10-CM

## 2016-05-21 MED ORDER — HYDROCODONE-HOMATROPINE 5-1.5 MG/5ML PO SYRP: 5.0000 mL | ORAL_SOLUTION | Freq: Every evening | ORAL | 0 refills | Status: DC | PRN

## 2016-05-21 MED ORDER — PREDNISONE 5 MG PO TABS: ORAL_TABLET | ORAL | 0 refills | Status: DC

## 2016-05-21 MED ORDER — DOXYCYCLINE HYCLATE 100 MG PO TABS: 100.0000 mg | ORAL_TABLET | Freq: Two times a day (BID) | ORAL | 0 refills | Status: DC

## 2016-05-21 NOTE — Progress Notes (Signed)
Leah [name] is a 56 y.o. female here for a new problem.  History of Present Illness:   Leah [name], CMA, acted as scribe for Dr. Juleen [name].  Chief Complaint  Patient presents with  . Ear Pain    Both ears   . Cough    x 1 week    Otalgia   There is pain in both ears. This is a new problem. The current episode started in the past 7 days. There has been no fever. The pain is mild. Associated symptoms include coughing, neck pain and rhinorrhea. Pertinent negatives include no abdominal pain, diarrhea, ear discharge, headaches, hearing loss, rash or sore throat. She has tried acetaminophen for the symptoms.  Cough  This is a new problem. The current episode started in the past 7 days. The problem has been gradually worsening. The problem occurs every few minutes. The cough is productive of purulent sputum. Associated symptoms include ear pain and rhinorrhea. Pertinent negatives include no chest pain, chills, fever, headaches, hemoptysis, rash, sore throat, shortness of breath or wheezing. The symptoms are aggravated by lying down.   PMHx, SurgHx, SocialHx, Medications, and Allergies were reviewed in the Visit Navigator and updated as appropriate.  Current Medications:   .  amLODipine (NORVASC) 10 MG tablet, Take 1 tablet (10 mg total) by mouth at bedtime., Disp: 90 tablet, Rfl: 3 .  aspirin 81 MG EC tablet, Take 1 tablet (81 mg total) by mouth daily., Disp: 90 tablet, Rfl: 0 .  fexofenadine ([employer] ALLERGY RELIEF) 180 MG tablet, Take 1 tablet (180 mg total) by mouth every morning., Disp: 90 tablet, Rfl: 3 .  fluticasone (FLONASE) 50 MCG/ACT nasal spray, USE 2 SPRAYS IN BOTH NOSTRILS EVERY DAY, Disp: 16 g, Rfl: 2 .  gabapentin (NEURONTIN) 100 MG capsule, Take 1 capsule (100 mg total) by mouth at bedtime., Disp: 90 capsule, Rfl: 3 .  metoprolol succinate (TOPROL-XL) 100 MG 24 hr tablet, TAKE 100 MG MOUTH EACH NIGHT WITH OR IMMEDIATELY FOLLOWING A MEAL, Disp: 90 tablet, Rfl: 0 .  montelukast  (SINGULAIR) 10 MG tablet, Take 1 tablet (10 mg total) by mouth at bedtime., Disp: 90 tablet, Rfl: 0 .  naproxen (NAPROSYN) 500 MG tablet, Take 1 tablet (500 mg total) by mouth 2 (two) times daily with a meal., Disp: 180 tablet, Rfl: 2 .  octreotide (SANDOSTATIN LAR) 30 MG injection, Inject 30 mg into the muscle every 28 (twenty-eight) days., Disp: , Rfl:  .  ondansetron (ZOFRAN-ODT) 4 MG disintegrating tablet, TAKE 1 TABLET BY MOUTH EVERY 8 HOURS AS NEEDED FOR NAUSEA OR VOMITING., Disp: 20 tablet, Rfl: 0 .  oxyCODONE (ROXICODONE) 5 MG immediate release tablet, Take 1 tablet (5 mg total) by mouth every 4 (four) hours as needed for severe pain., Disp: 30 tablet, Rfl: 0 .  pantoprazole (PROTONIX) 40 MG tablet, TAKE 1 TABLET (40 MG TOTAL) BY MOUTH DAILY. (Patient taking differently: TAKE 1 TABLET (40 MG TOTAL) BY MOUTH TWICE DAILY), Disp: 90 tablet, Rfl: 1 .  PROAIR HFA 108 (90 Base) MCG/ACT inhaler, INHALE 2 PUFFS INTO THE LUNGS EVERY 6 (SIX) HOURS AS NEEDED. FOR SHORTNESS OF BREATH., Disp: 6.7 Inhaler, Rfl: 4 .  valACYclovir (VALTREX) 500 MG tablet, Take 500 mg by mouth 2 (two) times daily as needed. Cold sores, Disp: , Rfl:  .  valsartan (DIOVAN) 320 MG tablet, TAKE 1 TABLET (320 MG TOTAL) BY MOUTH DAILY., Disp: 90 tablet, Rfl: 1 .  venlafaxine XR (EFFEXOR-XR) 150 MG 24 hr capsule, TAKE 1  CAPSULE (150 MG TOTAL) BY MOUTH EACH MORNING, Disp: 90 capsule, Rfl: 3  Review of Systems:   Review of Systems  Constitutional: Negative for chills, fever and malaise/fatigue.  HENT: Positive for congestion, ear pain and rhinorrhea. Negative for ear discharge, hearing loss, sinus pain and sore throat.   Eyes: Negative for blurred vision and double vision.  Respiratory: Positive for cough. Negative for hemoptysis, shortness of breath and wheezing.   Cardiovascular: Negative for chest pain, palpitations and leg swelling.  Gastrointestinal: Negative for abdominal pain, constipation and diarrhea.  Genitourinary:  Negative for dysuria.  Musculoskeletal: Positive for neck pain. Negative for back pain and joint pain.       Chronic  Skin: Negative for rash.  Neurological: Negative for dizziness and headaches.  Psychiatric/Behavioral: Negative for depression, hallucinations and memory loss.    Vitals:   Vitals:   05/21/16 1200  BP: 116/78  Pulse: 95  Temp: 98.4 F (36.9 C)  TempSrc: Oral  SpO2: 98%  Weight: 229 lb 3.2 oz (104 kg)     Body mass index is 45.9 kg/m.  Physical Exam:   Physical Exam  Constitutional: She appears well-nourished.  HENT:  Head: Normocephalic and atraumatic.  Right Ear: A middle ear effusion is present.  Left Ear: A middle ear effusion is present.  Eyes: EOM are normal. Pupils are equal, round, and reactive to light.  Neck: Normal range of motion. Neck supple.  Cardiovascular: Normal rate, regular rhythm, normal heart sounds and intact distal pulses.   Pulmonary/Chest: Effort normal. No respiratory distress. She has rhonchi.  Abdominal: Soft.  Skin: Skin is warm.  Psychiatric: She has a normal mood and affect. Her behavior is normal.  Nursing note and vitals reviewed.  Assessment and Plan:    Leah [name] was seen today for ear pain and cough.  Diagnoses and all orders for this visit:  Bilateral acute serous otitis media, recurrence not specified -     doxycycline (VIBRA-TABS) 100 MG tablet; Take 1 tablet (100 mg total) by mouth 2 (two) times daily. -     predniSONE (DELTASONE) 5 MG tablet; 6-5-4-3-2-1-off  Bronchitis -     HYDROcodone-homatropine (HYCODAN) 5-1.5 MG/5ML syrup; Take 5 mLs by mouth at bedtime as needed for cough.   . Reviewed expectations re: course of current medical issues. . Discussed self-management of symptoms. . Outlined signs and symptoms indicating need for more acute intervention. . Patient verbalized understanding and all questions were answered. . See orders for this visit as documented in the electronic medical  record. . Patient received an After-Visit Summary.  CMA served as Education administrator during this visit. History, Physical, and Plan performed by medical provider. Documentation and orders reviewed and attested to. Briscoe Deutscher, D.O.  Briscoe Deutscher, D.O. [place], [facility]

## 2016-05-21 NOTE — Progress Notes (Signed)
Pre visit review using our clinic review tool, if applicable. No additional management support is needed unless otherwise documented below in the visit note. 

## 2016-05-24 ENCOUNTER — Other Ambulatory Visit: Payer: Self-pay | Admitting: Family

## 2016-05-24 DIAGNOSIS — I10 Essential (primary) hypertension: Secondary | ICD-10-CM

## 2016-05-26 NOTE — Telephone Encounter (Signed)
New Patient as of 04/11/16 to Dr. Hulda Humphrey 04/16

## 2016-05-26 NOTE — Telephone Encounter (Signed)
Okay to refill? 

## 2016-05-28 ENCOUNTER — Other Ambulatory Visit (HOSPITAL_BASED_OUTPATIENT_CLINIC_OR_DEPARTMENT_OTHER): Payer: 59

## 2016-05-28 ENCOUNTER — Ambulatory Visit (HOSPITAL_BASED_OUTPATIENT_CLINIC_OR_DEPARTMENT_OTHER): Payer: 59 | Admitting: Family

## 2016-05-28 ENCOUNTER — Ambulatory Visit (HOSPITAL_BASED_OUTPATIENT_CLINIC_OR_DEPARTMENT_OTHER): Payer: 59

## 2016-05-28 VITALS — BP 154/88 | HR 83 | Temp 98.2°F | Resp 16 | Wt 228.1 lb

## 2016-05-28 DIAGNOSIS — C7A00 Malignant carcinoid tumor of unspecified site: Secondary | ICD-10-CM

## 2016-05-28 DIAGNOSIS — C7B02 Secondary carcinoid tumors of liver: Secondary | ICD-10-CM | POA: Diagnosis not present

## 2016-05-28 DIAGNOSIS — R112 Nausea with vomiting, unspecified: Secondary | ICD-10-CM

## 2016-05-28 DIAGNOSIS — C7B8 Other secondary neuroendocrine tumors: Secondary | ICD-10-CM

## 2016-05-28 LAB — CMP (CANCER CENTER ONLY)
ALBUMIN: 3.3 g/dL (ref 3.3–5.5)
ALK PHOS: 96 U/L — AB (ref 26–84)
ALT: 27 U/L (ref 10–47)
AST: 25 U/L (ref 11–38)
BILIRUBIN TOTAL: 0.7 mg/dL (ref 0.20–1.60)
BUN, Bld: 15 mg/dL (ref 7–22)
CALCIUM: 8.8 mg/dL (ref 8.0–10.3)
CO2: 27 mEq/L (ref 18–33)
Chloride: 103 mEq/L (ref 98–108)
Creat: 0.9 mg/dl (ref 0.6–1.2)
GLUCOSE: 112 mg/dL (ref 73–118)
Potassium: 4.2 mEq/L (ref 3.3–4.7)
Sodium: 137 mEq/L (ref 128–145)
TOTAL PROTEIN: 7.1 g/dL (ref 6.4–8.1)

## 2016-05-28 LAB — CBC WITH DIFFERENTIAL (CANCER CENTER ONLY)
BASO#: 0.1 10*3/uL (ref 0.0–0.2)
BASO%: 0.5 % (ref 0.0–2.0)
EOS%: 1.8 % (ref 0.0–7.0)
Eosinophils Absolute: 0.2 10*3/uL (ref 0.0–0.5)
HEMATOCRIT: 39.2 % (ref 34.8–46.6)
HEMOGLOBIN: 12.8 g/dL (ref 11.6–15.9)
LYMPH#: 3.6 10*3/uL — AB (ref 0.9–3.3)
LYMPH%: 27.7 % (ref 14.0–48.0)
MCH: 29.3 pg (ref 26.0–34.0)
MCHC: 32.7 g/dL (ref 32.0–36.0)
MCV: 90 fL (ref 81–101)
MONO#: 0.7 10*3/uL (ref 0.1–0.9)
MONO%: 5.5 % (ref 0.0–13.0)
NEUT%: 64.5 % (ref 39.6–80.0)
NEUTROS ABS: 8.4 10*3/uL — AB (ref 1.5–6.5)
Platelets: 304 10*3/uL (ref 145–400)
RBC: 4.37 10*6/uL (ref 3.70–5.32)
RDW: 15 % (ref 11.1–15.7)
WBC: 13 10*3/uL — ABNORMAL HIGH (ref 3.9–10.0)

## 2016-05-28 LAB — LACTATE DEHYDROGENASE: LDH: 272 U/L — AB (ref 125–245)

## 2016-05-28 MED ORDER — LANREOTIDE ACETATE 120 MG/0.5ML ~~LOC~~ SOLN
120.0000 mg | Freq: Once | SUBCUTANEOUS | Status: AC
  Administered 2016-05-28: 120 mg via SUBCUTANEOUS

## 2016-05-28 MED ORDER — LANREOTIDE ACETATE 120 MG/0.5ML ~~LOC~~ SOLN
SUBCUTANEOUS | Status: AC
  Filled 2016-05-28: qty 120

## 2016-05-28 NOTE — Progress Notes (Signed)
Hematology and Oncology Follow Up Visit  Leah [name] [id number] 12-Jun-1960 56 y.o. 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 She had a left hepatic lobe ablation of a recurrent 1.6 cm lesion found in the resection margin during a hernia repair in January. This was consistent with neuroendocrine tumor. She has recuperated nicely from this but still has some episodes of nausea with 2 episodes of vomiting.  She states that she will be having another CT scan with [facility] soon and will let us know when this is done.  She continues to tolerate Somatuline well and will stay on her monthly regimen. Chromagranin A today was 3.  She is getting over a sinus infection with cough and finishes her Doxycycline and Prednisone today.  No fever, chills, rash, dizziness, SOB, chest pain, palpitations, abdominal pain or changes in bowel or bladder habits.  No swelling, tenderness, numbness or tingling in her extremities. No new aches or pains.  She has a good appetite and is staying well hydrated. Her weight is stable.   ECOG Performance Status: 1 - Symptomatic but completely ambulatory  Medications:  Allergies as of 05/28/2016   No Known Allergies     Medication List       Accurate as of 05/28/16 12:32 PM. Always use your most recent med list.          amLODipine 10 MG tablet Commonly known as:  NORVASC Take 1 tablet (10 mg total) by mouth at bedtime.   amLODipine 10 MG tablet Commonly known as:  NORVASC TAKE 1 TABLET (10 MG TOTAL) BY MOUTH DAILY.   aspirin 81 MG EC tablet Take 1 tablet (81 mg total) by mouth daily.   doxycycline 100 MG tablet Commonly known as:  VIBRA-TABS Take 1 tablet (100 mg total) by mouth 2 (two) times  daily.   fexofenadine 180 MG tablet Commonly known as:  [employer] ALLERGY RELIEF Take 1 tablet (180 mg total) by mouth every morning.   fluticasone 50 MCG/ACT nasal spray Commonly known as:  FLONASE USE 2 SPRAYS IN BOTH NOSTRILS EVERY DAY   gabapentin 100 MG capsule Commonly known as:  NEURONTIN Take 1 capsule (100 mg total) by mouth at bedtime.   HYDROcodone-homatropine 5-1.5 MG/5ML syrup Commonly known as:  HYCODAN Take 5 mLs by mouth at bedtime as needed for cough.   metoprolol succinate 100 MG 24 hr tablet Commonly known as:  TOPROL-XL TAKE 100 MG MOUTH EACH NIGHT WITH OR IMMEDIATELY FOLLOWING A MEAL   metoprolol succinate 100 MG 24 hr tablet Commonly known as:  TOPROL-XL TAKE 1 TABLET BY MOUTH EVERY DAY WITH OR IMMEDIATELY FOLLOWING A MEAL   montelukast 10 MG tablet Commonly known as:  SINGULAIR Take 1 tablet (10 mg total) by mouth at bedtime.   naproxen 500 MG tablet Commonly known as:  NAPROSYN Take 1 tablet (500 mg total) by mouth 2 (two) times daily with a meal.   octreotide 30 MG injection Commonly known as:  SANDOSTATIN LAR Inject 30 mg into the muscle every 28 (twenty-eight) days.   ondansetron 4 MG disintegrating tablet Commonly known as:  ZOFRAN-ODT TAKE 1 TABLET BY MOUTH EVERY 8 HOURS AS NEEDED FOR NAUSEA OR VOMITING.   oxyCODONE 5 MG immediate release tablet Commonly known as:  ROXICODONE  Take 1 tablet (5 mg total) by mouth every 4 (four) hours as needed for severe pain.   pantoprazole 40 MG tablet Commonly known as:  PROTONIX TAKE 1 TABLET (40 MG TOTAL) BY MOUTH DAILY.   predniSONE 5 MG tablet Commonly known as:  DELTASONE 6-5-4-3-2-1-off   PROAIR HFA 108 (90 Base) MCG/ACT inhaler Generic drug:  albuterol INHALE 2 PUFFS INTO THE LUNGS EVERY 6 (SIX) HOURS AS NEEDED. FOR SHORTNESS OF BREATH.   valACYclovir 500 MG tablet Commonly known as:  VALTREX Take 500 mg by mouth 2 (two) times daily as needed. Cold sores   valsartan 320 MG tablet Commonly  known as:  DIOVAN TAKE 1 TABLET (320 MG TOTAL) BY MOUTH DAILY.   venlafaxine XR 150 MG 24 hr capsule Commonly known as:  EFFEXOR-XR TAKE 1 CAPSULE (150 MG TOTAL) BY MOUTH EACH MORNING       Allergies: No Known Allergies  Past Medical History, Surgical history, Social history, and Family History were reviewed and updated.  Review of Systems: All other 10 point review of systems is negative.   Physical Exam:  vitals were not taken for this visit.  Wt Readings from Last 3 Encounters:  05/21/16 229 lb 3.2 oz (104 kg)  05/01/16 229 lb 3.2 oz (104 kg)  04/11/16 227 lb 9.6 oz (103.2 kg)    Ocular: Sclerae unicteric, pupils equal, round and reactive to light Ear-nose-throat: Oropharynx clear, dentition fair Lymphatic: No cervical, supraclavicular or axillary adenopathy Lungs no rales or rhonchi, good excursion bilaterally Heart regular rate and rhythm, no murmur appreciated Abd soft, nontender, positive bowel sounds, no liver or spleen tip palpated on exam, no fluid wave MSK no focal spinal tenderness, no joint edema Neuro: non-focal, well-oriented, appropriate affect Breasts: Deferred   Lab Results  Component Value Date   WBC 13.0 (H) 05/28/2016   HGB 12.8 05/28/2016   HCT 39.2 05/28/2016   MCV 90 05/28/2016   PLT 304 05/28/2016   Lab Results  Component Value Date   FERRITIN 382 (H) 10/18/2008   IRON <10 (L) 10/18/2008   TIBC NOT CALC Not calculated due to Iron <10. 10/18/2008   UIBC 156 10/18/2008   IRONPCTSAT NOT CALC Not calculated due to Iron <10. 10/18/2008   Lab Results  Component Value Date   RETICCTPCT 0.9 10/18/2008   RBC 4.37 05/28/2016   No results found for: KPAFRELGTCHN, LAMBDASER, KAPLAMBRATIO No results found for: IGGSERUM, IGA, IGMSERUM No results found for: Kathrynn Ducking, MSPIKE, SPEI   Chemistry      Component Value Date/Time   NA 129 (L) 03/27/2016 1328   NA 141 01/17/2016 1047   K 3.4 (L)  03/27/2016 1328   K 3.2 (L) 01/17/2016 1047   CL 100 03/27/2016 1328   CL 105 09/05/2015 1112   CO2 23 03/27/2016 1328   CO2 27 01/17/2016 1047   BUN 17 03/27/2016 1328   BUN 6.6 (L) 01/17/2016 1047   CREATININE 0.69 03/27/2016 1328   CREATININE 0.7 01/17/2016 1047      Component Value Date/Time   CALCIUM 9.2 03/27/2016 1328   CALCIUM 8.7 01/17/2016 1047   ALKPHOS 96 03/27/2016 1328   ALKPHOS 90 01/17/2016 1047   AST 17 03/27/2016 1328   AST 34 01/17/2016 1047   ALT 18 03/27/2016 1328   ALT 38 01/17/2016 1047   BILITOT 0.4 03/27/2016 1328   BILITOT 0.42 01/17/2016 1047      Impression and Plan: Ms. Ledger is 30 to  caucasian female with a long standing history of carcinoid. She recently underwent a hepatic ablation during a hernia repair with Dr. Crisoforo Oxford. Chromogrannn A today is 3. Dr. Crisoforo Oxford will be doing CT scans every 3 months for the next year and then to every 6 months. She is doing well but still has occasional nausea and vomiting. She will continue her monthly treatment with Somatuline and received her injection today.   We will plan to see her back in 2 months for repeat lab work and follow-up.  I spent 15 minutes face to face counseling the patient and her husband. All questions were answered.  Both she and her husband know to contact our office with any questions or concerns. We can certainly see her sooner if need be.   Eliezer Bottom, NP 4/18/201812:32 PM

## 2016-05-29 LAB — CHROMOGRANIN A: Chromogranin A: 3 nmol/L (ref 0–5)

## 2016-06-03 ENCOUNTER — Other Ambulatory Visit: Payer: Self-pay | Admitting: Hematology & Oncology

## 2016-06-17 ENCOUNTER — Ambulatory Visit: Payer: 59 | Admitting: Family Medicine

## 2016-06-18 ENCOUNTER — Ambulatory Visit: Payer: 59 | Admitting: Family Medicine

## 2016-06-18 ENCOUNTER — Encounter: Payer: Self-pay | Admitting: Family Medicine

## 2016-06-18 ENCOUNTER — Ambulatory Visit (INDEPENDENT_AMBULATORY_CARE_PROVIDER_SITE_OTHER): Payer: 59 | Admitting: Family Medicine

## 2016-06-18 VITALS — BP 154/102 | HR 97 | Temp 98.2°F | Wt 232.0 lb

## 2016-06-18 DIAGNOSIS — H6123 Impacted cerumen, bilateral: Secondary | ICD-10-CM

## 2016-06-18 NOTE — Progress Notes (Signed)
Subjective:    Patient ID: Leah [name], female    DOB: 08-Jun-1960, 56 y.o.   MRN: [id number]  HPI This is a 56 yo female, accompanied by her husband, who presents today with ear pain and sinus problem x 1 month. She was seen 05/21/16 and diagnosed with bilateral OM with effusion, treated with doxy and prednisone. Feels like her symptoms improved but ears still feel blocked, like she needs to clear them. A little nasal drainage, off and on sinus pressure. Using OTC ear softening solution a couple of times a week. Using flonase daily in the morning. No fever/chills or cough.    Past Medical History:  Diagnosis Date  . Allergic rhinitis   . Atrophic vaginitis   . Borderline diabetes 03/17/2013  . Carcinoid syndrome ([place])   . GE reflux   . Hepatic encephalopathy ([place]) 2011  . History of retinal vein occlusion    right eye  . HTN (hypertension) 2011  . Migraines   . Neuroendocrine tumor 2011  . Orthostatic hypotension   . Palpitations   . Sarcoidosis 2008  . STD (sexually transmitted disease)    HSV I  . Surgical menopause    Past Surgical History:  Procedure Laterality Date  . ABDOMINAL HYSTERECTOMY  2004  . bilateral mastectectomy Bilateral 2008  . BREAST SURGERY     bilateral masectomy and reconstruction with silicone implants  . CESAREAN SECTION  [id number]  . CHOLECYSTECTOMY  2006  . hepatetetomy    . KNEE ARTHROSCOPY Left 09/2010  . latissimus dorsi graft  2008   used for reconstructive breast surgery  . MASTECTOMY  2008   bilateral  . OOPHORECTOMY  2004  . TONSILLECTOMY  1976  . TUBAL LIGATION  2001   BTL   Family History  Problem Relation Age of Onset  . Arthritis Maternal Grandmother   . Arthritis Maternal Grandfather   . Arthritis Paternal Grandmother   . Arthritis Paternal Grandfather   . Heart failure Paternal Grandfather   . Breast cancer Mother 30    died at 45  . Lupus Mother   . Heart failure Father   . Hypertension Father   . Hyperlipidemia  Father   . Breast cancer Maternal Aunt 38    deceased at 55 recurrence  . Ovarian cancer Maternal Aunt 85     died with in 22 months  . Breast cancer Maternal Aunt 107    died at age 38   Social History  Substance Use Topics  . Smoking status: Never Smoker  . Smokeless tobacco: Never Used     Comment: never used tobacco  . Alcohol use No      Review of Systems Per HPI    Objective:   Physical Exam  Constitutional: She is oriented to person, place, and time. She appears well-developed and well-nourished. No distress.  HENT:  Head: Normocephalic and atraumatic.  Right Ear: External ear and ear canal normal.  Left Ear: External ear and ear canal normal.  Mouth/Throat: Oropharynx is clear and moist.  Bilateral TMs obscured by cerumen. Right ear successfully irrigated and TM visualized- no redness or bulging. Left canal unable to be cleared to visualize TM, cerumen very distal. Non tender to exam.   Eyes: Conjunctivae are normal.  Cardiovascular: Normal rate.   Pulmonary/Chest: Effort normal.  Neurological: She is alert and oriented to person, place, and time.  Skin: Skin is warm and dry. She is not diaphoretic.  Psychiatric: She has a normal mood  and affect. Her behavior is normal. Judgment and thought content normal.  Vitals reviewed.     BP (!) 154/102 (BP Location: Right Arm, Patient Position: Sitting, Cuff Size: Normal)   Pulse 97   Temp 98.2 F (36.8 C) (Oral)   Wt 232 lb (105.2 kg)   SpO2 97%   BMI 46.46 kg/m  Wt Readings from Last 3 Encounters:  06/18/16 232 lb (105.2 kg)  05/28/16 228 lb 1.9 oz (103.5 kg)  05/21/16 229 lb 3.2 oz (104 kg)   BP Readings from Last 3 Encounters:  06/18/16 (!) 154/102  05/28/16 (!) 154/88  05/21/16 116/78       Assessment & Plan:  1. Bilateral impacted cerumen - Provided written and verbal information regarding diagnosis and treatment. - She was instructed to increase OTC wax softening drops to every day, if no improvement  in 5 days, return for repeat irrigation. RTC sooner if she develops pain, drainage or fever.   2. Elevated blood pressure - patient reports that this is not an unusual reading for her, she will have it rechecked at the [facility] next week.   Clarene Reamer, FNP-BC  [facility] [place]  06/18/2016 10:53 AM

## 2016-06-18 NOTE — Patient Instructions (Signed)
Earwax Buildup Your ears make a substance called earwax. It may also be called cerumen. Sometimes, too much earwax builds up in your ear canal. This can cause ear pain and make it harder for you to hear. CAUSES This condition is caused by too much earwax production or buildup. RISK FACTORS The following factors may make you more likely to develop this condition:  Cleaning your ears often with swabs.  Having narrow ear canals.  Having earwax that is overly thick or sticky.  Having eczema.  Being dehydrated. SYMPTOMS Symptoms of this condition include:  Reduced hearing.  Ear drainage.  Ear pain.  Ear itch.  A feeling of fullness in the ear or feeling that the ear is plugged.  Ringing in the ear.  Coughing. DIAGNOSIS Your health care provider can diagnose this condition based on your symptoms and medical history. Your health care provider will also do an ear exam to look inside your ear with a scope (otoscope). You may also have a hearing test. TREATMENT Treatment for this condition includes:  Over-the-counter or prescription ear drops to soften the earwax.  Earwax removal by a health care provider. This may be done:  By flushing the ear with body-temperature water.  With a medical instrument that has a loop at the end (earwax curette).  With a suction device. HOME CARE INSTRUCTIONS  Take over-the-counter and prescription medicines only as told by your health care provider.  Do not put any objects, including an ear swab, into your ear. You can clean the opening of your ear canal with a washcloth.  Drink enough water to keep your urine clear or pale yellow.  If you have frequent earwax buildup or you use hearing aids, consider seeing your health care provider every 6-12 months for routine preventive ear cleanings. Keep all follow-up visits as told by your health care provider. SEEK MEDICAL CARE IF:  You have ear pain.  Your condition does not improve with  treatment.  You have hearing loss.  You have blood, pus, or other fluid coming from your ear. This information is not intended to replace advice given to you by your health care provider. Make sure you discuss any questions you have with your health care provider. Document Released: 03/06/2004 Document Revised: 05/21/2015 Document Reviewed: 09/13/2014 Elsevier Interactive Patient Education  2017 [employer].

## 2016-06-23 ENCOUNTER — Other Ambulatory Visit: Payer: Self-pay | Admitting: Family Medicine

## 2016-06-23 DIAGNOSIS — H6503 Acute serous otitis media, bilateral: Secondary | ICD-10-CM

## 2016-06-23 NOTE — Telephone Encounter (Signed)
Spoke with patient and she stated that she does not need medication.

## 2016-06-23 NOTE — Telephone Encounter (Signed)
Please advise on refill. Patient was seen by you on 06/18/16. Dr. Juleen China is out of the office.

## 2016-06-23 NOTE — Telephone Encounter (Signed)
When I saw her last week, her symptoms from her URI were nearly resolved. If she has recurrence of symptoms, she needs to be seen.

## 2016-06-24 ENCOUNTER — Ambulatory Visit: Payer: 59 | Admitting: Family Medicine

## 2016-06-25 ENCOUNTER — Ambulatory Visit (INDEPENDENT_AMBULATORY_CARE_PROVIDER_SITE_OTHER): Payer: 59 | Admitting: Physician Assistant

## 2016-06-25 ENCOUNTER — Ambulatory Visit (HOSPITAL_BASED_OUTPATIENT_CLINIC_OR_DEPARTMENT_OTHER): Payer: 59

## 2016-06-25 ENCOUNTER — Encounter: Payer: Self-pay | Admitting: Physician Assistant

## 2016-06-25 VITALS — BP 155/103 | HR 97 | Temp 98.8°F | Resp 18

## 2016-06-25 DIAGNOSIS — C7A00 Malignant carcinoid tumor of unspecified site: Secondary | ICD-10-CM

## 2016-06-25 DIAGNOSIS — C7B02 Secondary carcinoid tumors of liver: Secondary | ICD-10-CM | POA: Diagnosis not present

## 2016-06-25 DIAGNOSIS — H6122 Impacted cerumen, left ear: Secondary | ICD-10-CM

## 2016-06-25 DIAGNOSIS — C7B8 Other secondary neuroendocrine tumors: Secondary | ICD-10-CM

## 2016-06-25 DIAGNOSIS — H938X2 Other specified disorders of left ear: Secondary | ICD-10-CM

## 2016-06-25 MED ORDER — LANREOTIDE ACETATE 120 MG/0.5ML ~~LOC~~ SOLN
SUBCUTANEOUS | Status: AC
  Filled 2016-06-25: qty 120

## 2016-06-25 MED ORDER — LANREOTIDE ACETATE 120 MG/0.5ML ~~LOC~~ SOLN
120.0000 mg | Freq: Once | SUBCUTANEOUS | Status: AC
  Administered 2016-06-25: 120 mg via SUBCUTANEOUS

## 2016-06-25 NOTE — Progress Notes (Signed)
Leah [name] is a 56 y.o. female here for a follow up of a pre-existing problem.  Leah [name], cma is acting as a Education administrator for [employer], [place].  History of Present Illness:   Chief Complaint  Patient presents with  . Left Ear Fullness with Hearing Loss    Leah [name] c/o LT ear fullness with worsening hearing loss. No pain. She is currently using wax softening drop 1-2 times per day. No other concerns at this time. She denies fevers, chills. She does have intermittent sinus tenderness and drainage, but this is a chronic issue for her.  She was seen one week ago and was unable to have her L ear completely irrigated.   PMHx, SurgHx, SocialHx, Medications, and Allergies were reviewed in the Visit Navigator and updated as appropriate.  Current Medications:   Current Outpatient Prescriptions:  .  amLODipine (NORVASC) 10 MG tablet, TAKE 1 TABLET (10 MG TOTAL) BY MOUTH DAILY., Disp: 90 tablet, Rfl: 1 .  aspirin 81 MG EC tablet, Take 1 tablet (81 mg total) by mouth daily., Disp: 90 tablet, Rfl: 0 .  fexofenadine ([employer] ALLERGY RELIEF) 180 MG tablet, Take 1 tablet (180 mg total) by mouth every morning., Disp: 90 tablet, Rfl: 3 .  fluticasone (FLONASE) 50 MCG/ACT nasal spray, USE 2 SPRAYS IN BOTH NOSTRILS EVERY DAY, Disp: 16 g, Rfl: 2 .  gabapentin (NEURONTIN) 100 MG capsule, Take 1 capsule (100 mg total) by mouth at bedtime., Disp: 90 capsule, Rfl: 3 .  metoprolol succinate (TOPROL-XL) 100 MG 24 hr tablet, TAKE 1 TABLET BY MOUTH EVERY DAY WITH OR IMMEDIATELY FOLLOWING A MEAL, Disp: 90 tablet, Rfl: 1 .  montelukast (SINGULAIR) 10 MG tablet, Take 1 tablet (10 mg total) by mouth at bedtime., Disp: 90 tablet, Rfl: 0 .  naproxen (NAPROSYN) 500 MG tablet, Take 1 tablet (500 mg total) by mouth 2 (two) times daily with a meal., Disp: 180 tablet, Rfl: 2 .  octreotide (SANDOSTATIN LAR) 30 MG injection, Inject 30 mg into the muscle every 28 (twenty-eight) days., Disp: , Rfl:  .  ondansetron (ZOFRAN-ODT) 4  MG disintegrating tablet, TAKE 1 TABLET BY MOUTH EVERY 8 HOURS AS NEEDED FOR NAUSEA OR VOMITING., Disp: 20 tablet, Rfl: 0 .  OSPHENA 60 MG TABS, TAKE ONE TABLET BY MOUTH DAILY, Disp: 90 tablet, Rfl: 3 .  oxyCODONE (ROXICODONE) 5 MG immediate release tablet, Take 1 tablet (5 mg total) by mouth every 4 (four) hours as needed for severe pain., Disp: 30 tablet, Rfl: 0 .  pantoprazole (PROTONIX) 40 MG tablet, TAKE 1 TABLET (40 MG TOTAL) BY MOUTH DAILY. (Patient taking differently: TAKE 1 TABLET (40 MG TOTAL) BY MOUTH TWICE DAILY), Disp: 90 tablet, Rfl: 1 .  PROAIR HFA 108 (90 Base) MCG/ACT inhaler, INHALE 2 PUFFS INTO THE LUNGS EVERY 6 (SIX) HOURS AS NEEDED. FOR SHORTNESS OF BREATH., Disp: 6.7 Inhaler, Rfl: 4 .  valACYclovir (VALTREX) 500 MG tablet, Take 500 mg by mouth 2 (two) times daily as needed. Cold sores, Disp: , Rfl:  .  valsartan (DIOVAN) 320 MG tablet, TAKE 1 TABLET (320 MG TOTAL) BY MOUTH DAILY., Disp: 90 tablet, Rfl: 1 .  venlafaxine XR (EFFEXOR-XR) 150 MG 24 hr capsule, TAKE 1 CAPSULE (150 MG TOTAL) BY MOUTH EACH MORNING, Disp: 90 capsule, Rfl: 3   Review of Systems:   Review of Systems  Constitutional: Negative.  Negative for chills, fever and weight loss.  HENT: Positive for ear discharge and hearing loss (L ear). Negative for ear pain  and tinnitus.   Eyes: Negative.   Respiratory: Negative.  Negative for cough and shortness of breath.   Cardiovascular: Negative.  Negative for chest pain and palpitations.  Gastrointestinal: Negative.   Genitourinary: Negative.   Musculoskeletal: Negative.   Skin: Negative.   Neurological: Negative.   Endo/Heme/Allergies: Positive for environmental allergies.  Psychiatric/Behavioral: Negative.     Vitals:   Vitals:   06/25/16 1003  BP: (!) 140/100  Pulse: 99     There is no height or weight on file to calculate BMI.  Physical Exam:   Physical Exam  Constitutional: She appears well-developed. She is cooperative.  Non-toxic appearance.  She does not have a sickly appearance. She does not appear ill. No distress.  HENT:  Head: Normocephalic and atraumatic.  Right Ear: Tympanic membrane, external ear and ear canal normal. Tympanic membrane is not erythematous, not retracted and not bulging.  Left Ear: External ear and ear canal normal. Decreased hearing is noted.  Nose: Nose normal. Right sinus exhibits no maxillary sinus tenderness and no frontal sinus tenderness. Left sinus exhibits no maxillary sinus tenderness and no frontal sinus tenderness.  Mouth/Throat: Uvula is midline. No posterior oropharyngeal edema or posterior oropharyngeal erythema.  Unable to visualize left TM secondary to serum and impaction. After cerumen removal, tympanic membrane was visualized and no redness or perforation was noted. Hearing was restored.  Eyes: Conjunctivae and lids are normal.  Neck: Trachea normal.  Cardiovascular: Normal rate, regular rhythm, S1 normal, S2 normal and normal heart sounds.   Pulmonary/Chest: Effort normal and breath sounds normal. She has no decreased breath sounds. She has no wheezes. She has no rhonchi. She has no rales.  Lymphadenopathy:    She has no cervical adenopathy.  Neurological: She is alert.  Skin: Skin is warm, dry and intact.  Psychiatric: She has a normal mood and affect. Her speech is normal and behavior is normal.  Nursing note and vitals reviewed.    Cerumen was removed using gentle irrigation. Tympanic membrane was intact following the procedure.  Auditory canals are normal.   Assessment and Plan:    [name] was seen today for left ear fullness with hearing loss.  Diagnoses and all orders for this visit:  Impacted cerumen of left ear   Left ear was irrigated by CMA during visit. Patient tolerated procedure well. Impacted cerumen resolved. Advised patient to follow-up if any other concerns. Advised avoiding use of q-tips or other devices.  . Reviewed expectations re: course of current medical  issues. . Discussed self-management of symptoms. . Outlined signs and symptoms indicating need for more acute intervention. . Patient verbalized understanding and all questions were answered. . See orders for this visit as documented in the electronic medical record. . Patient received an After-Visit Summary.  CMA or LPN served as scribe during this visit. History, Physical, and Plan performed by medical provider. Documentation and orders reviewed and attested to.  Inda Coke, PA-C

## 2016-07-14 ENCOUNTER — Encounter: Payer: Self-pay | Admitting: Family Medicine

## 2016-07-15 ENCOUNTER — Telehealth: Payer: Self-pay | Admitting: Surgical

## 2016-07-15 MED ORDER — ONDANSETRON 4 MG PO TBDP: ORAL_TABLET | ORAL | 0 refills | Status: DC

## 2016-07-15 NOTE — Telephone Encounter (Signed)
RX sent to the pharmacy  

## 2016-07-19 ENCOUNTER — Encounter: Payer: Self-pay | Admitting: Hematology & Oncology

## 2016-07-23 ENCOUNTER — Ambulatory Visit (HOSPITAL_BASED_OUTPATIENT_CLINIC_OR_DEPARTMENT_OTHER): Payer: 59 | Admitting: Hematology & Oncology

## 2016-07-23 ENCOUNTER — Other Ambulatory Visit (HOSPITAL_BASED_OUTPATIENT_CLINIC_OR_DEPARTMENT_OTHER): Payer: 59

## 2016-07-23 ENCOUNTER — Ambulatory Visit (HOSPITAL_BASED_OUTPATIENT_CLINIC_OR_DEPARTMENT_OTHER): Payer: 59

## 2016-07-23 VITALS — BP 153/86 | HR 85 | Temp 98.6°F | Resp 19 | Wt 234.0 lb

## 2016-07-23 DIAGNOSIS — C7A00 Malignant carcinoid tumor of unspecified site: Secondary | ICD-10-CM | POA: Diagnosis not present

## 2016-07-23 DIAGNOSIS — C7B8 Other secondary neuroendocrine tumors: Secondary | ICD-10-CM

## 2016-07-23 DIAGNOSIS — C7B02 Secondary carcinoid tumors of liver: Secondary | ICD-10-CM

## 2016-07-23 LAB — CBC WITH DIFFERENTIAL (CANCER CENTER ONLY)
BASO#: 0 10*3/uL (ref 0.0–0.2)
BASO%: 0.3 % (ref 0.0–2.0)
EOS%: 1.8 % (ref 0.0–7.0)
Eosinophils Absolute: 0.2 10*3/uL (ref 0.0–0.5)
HEMATOCRIT: 37.9 % (ref 34.8–46.6)
HGB: 12.4 g/dL (ref 11.6–15.9)
LYMPH#: 2.8 10*3/uL (ref 0.9–3.3)
LYMPH%: 28.6 % (ref 14.0–48.0)
MCH: 29.8 pg (ref 26.0–34.0)
MCHC: 32.7 g/dL (ref 32.0–36.0)
MCV: 91 fL (ref 81–101)
MONO#: 0.7 10*3/uL (ref 0.1–0.9)
MONO%: 6.7 % (ref 0.0–13.0)
NEUT#: 6.1 10*3/uL (ref 1.5–6.5)
NEUT%: 62.6 % (ref 39.6–80.0)
PLATELETS: 245 10*3/uL (ref 145–400)
RBC: 4.16 10*6/uL (ref 3.70–5.32)
RDW: 14.3 % (ref 11.1–15.7)
WBC: 9.8 10*3/uL (ref 3.9–10.0)

## 2016-07-23 LAB — CMP (CANCER CENTER ONLY)
ALK PHOS: 90 U/L — AB (ref 26–84)
ALT: 44 U/L (ref 10–47)
AST: 46 U/L — AB (ref 11–38)
Albumin: 3.3 g/dL (ref 3.3–5.5)
BILIRUBIN TOTAL: 0.6 mg/dL (ref 0.20–1.60)
BUN, Bld: 14 mg/dL (ref 7–22)
CALCIUM: 9 mg/dL (ref 8.0–10.3)
CO2: 26 meq/L (ref 18–33)
Chloride: 105 mEq/L (ref 98–108)
Creat: 0.8 mg/dl (ref 0.6–1.2)
GLUCOSE: 132 mg/dL — AB (ref 73–118)
Potassium: 3.9 mEq/L (ref 3.3–4.7)
SODIUM: 140 meq/L (ref 128–145)
Total Protein: 7.2 g/dL (ref 6.4–8.1)

## 2016-07-23 LAB — LACTATE DEHYDROGENASE: LDH: 287 U/L — ABNORMAL HIGH (ref 125–245)

## 2016-07-23 MED ORDER — LANREOTIDE ACETATE 120 MG/0.5ML ~~LOC~~ SOLN
120.0000 mg | Freq: Once | SUBCUTANEOUS | Status: AC
  Administered 2016-07-23: 120 mg via SUBCUTANEOUS

## 2016-07-23 MED ORDER — LANREOTIDE ACETATE 120 MG/0.5ML ~~LOC~~ SOLN
SUBCUTANEOUS | Status: AC
Start: 2016-07-23 — End: 2016-07-23
  Filled 2016-07-23: qty 120

## 2016-07-23 NOTE — Progress Notes (Signed)
Hematology and Oncology Follow Up Visit  Leah [name] [id number] [birth year] 56 y.o. 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 Unfortunately, her mother-in-law, who It take care of, fell and broke her left hip. She had surgery for this over the weekend.   Leah [name] had an MRI at [facility] last week. It showed that there was a 1 x 1 cm area of recurrence where she had a past ablation. She sees her surgeon in a couple weeks.  She's had no abdominal pain. She's had no diarrhea. She's had no rash. His been no cough. She's had no fever.  Her last chromogranin A level was 9.  Overall, her performance status is ECOG 1.  Medications:  Allergies as of 07/23/2016   No Known Allergies     Medication List       Accurate as of 07/23/16  3:07 PM. Always use your most recent med list.          amLODipine 10 MG tablet Commonly known as:  NORVASC TAKE 1 TABLET (10 MG TOTAL) BY MOUTH DAILY.   aspirin 81 MG EC tablet Take 1 tablet (81 mg total) by mouth daily.   fexofenadine 180 MG tablet Commonly known as:  [employer] ALLERGY RELIEF Take 1 tablet (180 mg total) by mouth every morning.   fluticasone 50 MCG/ACT nasal spray Commonly known as:  FLONASE USE 2 SPRAYS IN BOTH NOSTRILS EVERY DAY   gabapentin 100 MG capsule Commonly known as:  NEURONTIN Take 1 capsule (100 mg total) by mouth at bedtime.   metoprolol succinate 100 MG 24 hr tablet Commonly known as:  TOPROL-XL TAKE 1 TABLET BY MOUTH EVERY DAY WITH OR IMMEDIATELY FOLLOWING A MEAL   montelukast 10 MG tablet Commonly known as:  SINGULAIR Take 1 tablet (10 mg total) by mouth at bedtime.   octreotide 30 MG injection Commonly known as:  SANDOSTATIN LAR Inject 30 mg into the muscle every 28  (twenty-eight) days.   ondansetron 4 MG disintegrating tablet Commonly known as:  ZOFRAN-ODT TAKE 1 TABLET BY MOUTH EVERY 8 HOURS AS NEEDED FOR NAUSEA OR VOMITING.   OSPHENA 60 MG Tabs Generic drug:  Ospemifene TAKE ONE TABLET BY MOUTH DAILY   oxyCODONE 5 MG immediate release tablet Commonly known as:  ROXICODONE Take 1 tablet (5 mg total) by mouth every 4 (four) hours as needed for severe pain.   pantoprazole 40 MG tablet Commonly known as:  PROTONIX TAKE 1 TABLET (40 MG TOTAL) BY MOUTH DAILY.   PROAIR HFA 108 (90 Base) MCG/ACT inhaler Generic drug:  albuterol INHALE 2 PUFFS INTO THE LUNGS EVERY 6 (SIX) HOURS AS NEEDED. FOR SHORTNESS OF BREATH.   valACYclovir 500 MG tablet Commonly known as:  VALTREX Take 500 mg by mouth 2 (two) times daily as needed. Cold sores   valsartan 320 MG tablet Commonly known as:  DIOVAN TAKE 1 TABLET (320 MG TOTAL) BY MOUTH DAILY.   venlafaxine XR 150 MG 24 hr capsule Commonly known as:  EFFEXOR-XR TAKE 1 CAPSULE (150 MG TOTAL) BY MOUTH EACH MORNING       Allergies: No Known Allergies  Past Medical History, Surgical history, Social history, and Family History were reviewed and updated.  Review of Systems: All other 10 point review of systems is negative.  Physical Exam:  weight is 234 lb (106.1 kg). Her oral temperature is 98.6 F (37 C). Her blood pressure is 153/86 (abnormal) and her pulse is 85. Her respiration is 19 and oxygen saturation is 97%.   Wt Readings from Last 3 Encounters:  07/23/16 234 lb (106.1 kg)  06/18/16 232 lb (105.2 kg)  05/28/16 228 lb 1.9 oz (103.5 kg)    Well-developed and well-nourished white female. She is somewhat obese. Head and neck exam shows no ocular or oral lesions. There are no palpable cervical or supraclavicular lymph nodes. Lungs are clear. Cardiac exam regular rate and rhythm with no murmurs, rubs or bruits. Abdomen is soft. She has healed laparotomy scar. There is no fluid wave. There is no  abdominal mass. There is no palpable liver or spleen tip. Back exam shows no tenderness over the spine, ribs or hips. Extremities shows no clubbing, cyanosis or edema. Neurological exam shows no focal neurological deficits. Skin exam shows no rashes, ecchymoses or petechia.   Lab Results  Component Value Date   WBC 9.8 07/23/2016   HGB 12.4 07/23/2016   HCT 37.9 07/23/2016   MCV 91 07/23/2016   PLT 245 07/23/2016   Lab Results  Component Value Date   FERRITIN 382 (H) 10/18/2008   IRON <10 (L) 10/18/2008   TIBC NOT CALC Not calculated due to Iron <10. 10/18/2008   UIBC 156 10/18/2008   IRONPCTSAT NOT CALC Not calculated due to Iron <10. 10/18/2008   Lab Results  Component Value Date   RETICCTPCT 0.9 10/18/2008   RBC 4.16 07/23/2016   No results found for: KPAFRELGTCHN, LAMBDASER, KAPLAMBRATIO No results found for: IGGSERUM, IGA, IGMSERUM No results found for: Odetta Pink, SPEI   Chemistry      Component Value Date/Time   NA 140 07/23/2016 1343   NA 141 01/17/2016 1047   K 3.9 07/23/2016 1343   K 3.2 (L) 01/17/2016 1047   CL 105 07/23/2016 1343   CO2 26 07/23/2016 1343   CO2 27 01/17/2016 1047   BUN 14 07/23/2016 1343   BUN 6.6 (L) 01/17/2016 1047   CREATININE 0.8 07/23/2016 1343   CREATININE 0.7 01/17/2016 1047      Component Value Date/Time   CALCIUM 9.0 07/23/2016 1343   CALCIUM 8.7 01/17/2016 1047   ALKPHOS 90 (H) 07/23/2016 1343   ALKPHOS 90 01/17/2016 1047   AST 46 (H) 07/23/2016 1343   AST 34 01/17/2016 1047   ALT 44 07/23/2016 1343   ALT 38 01/17/2016 1047   BILITOT 0.60 07/23/2016 1343   BILITOT 0.42 01/17/2016 1047     Impression and Plan: Leah [name] is 56 year old white female. She has a long-standing history of carcinoid.   This MRI that she had done last week is a little bit concerning for a small local recurrence in the liver.  She sees her surgical oncologist at [facility] in a couple  weeks.  I think that she would be a good candidate for another RFA.  We will go ahead with her Somatuline today.  We will get her back in another month given that she has this issue going on.  [name], MD 6/13/20183:07 PM

## 2016-07-25 LAB — CHROMOGRANIN A: Chromogranin A: 2 nmol/L (ref 0–5)

## 2016-08-07 ENCOUNTER — Telehealth: Payer: Self-pay | Admitting: Family Medicine

## 2016-08-07 NOTE — Telephone Encounter (Signed)
Patient called  By Leah [name] imaging for breast ultrasound on 04/10 and stated she would call back to schedule. Has not scheduled.

## 2016-08-07 NOTE — Telephone Encounter (Signed)
Okay to cancel and will address again at a future visit.

## 2016-08-11 ENCOUNTER — Other Ambulatory Visit (HOSPITAL_BASED_OUTPATIENT_CLINIC_OR_DEPARTMENT_OTHER): Payer: Self-pay

## 2016-08-11 DIAGNOSIS — G4733 Obstructive sleep apnea (adult) (pediatric): Secondary | ICD-10-CM

## 2016-08-19 ENCOUNTER — Other Ambulatory Visit: Payer: Self-pay | Admitting: Family Medicine

## 2016-08-19 DIAGNOSIS — J301 Allergic rhinitis due to pollen: Secondary | ICD-10-CM

## 2016-08-19 DIAGNOSIS — H6503 Acute serous otitis media, bilateral: Secondary | ICD-10-CM

## 2016-08-20 ENCOUNTER — Ambulatory Visit (HOSPITAL_BASED_OUTPATIENT_CLINIC_OR_DEPARTMENT_OTHER): Payer: 59 | Admitting: Hematology & Oncology

## 2016-08-20 ENCOUNTER — Other Ambulatory Visit (HOSPITAL_BASED_OUTPATIENT_CLINIC_OR_DEPARTMENT_OTHER): Payer: 59

## 2016-08-20 ENCOUNTER — Ambulatory Visit (HOSPITAL_BASED_OUTPATIENT_CLINIC_OR_DEPARTMENT_OTHER): Payer: 59

## 2016-08-20 VITALS — BP 141/84 | HR 86 | Temp 98.7°F | Resp 18 | Wt 231.0 lb

## 2016-08-20 DIAGNOSIS — C7B8 Other secondary neuroendocrine tumors: Secondary | ICD-10-CM

## 2016-08-20 DIAGNOSIS — C7A00 Malignant carcinoid tumor of unspecified site: Secondary | ICD-10-CM

## 2016-08-20 DIAGNOSIS — C7B02 Secondary carcinoid tumors of liver: Secondary | ICD-10-CM

## 2016-08-20 LAB — CBC WITH DIFFERENTIAL (CANCER CENTER ONLY)
BASO#: 0 10*3/uL (ref 0.0–0.2)
BASO%: 0.3 % (ref 0.0–2.0)
EOS ABS: 0.2 10*3/uL (ref 0.0–0.5)
EOS%: 1.3 % (ref 0.0–7.0)
HEMATOCRIT: 40.1 % (ref 34.8–46.6)
HEMOGLOBIN: 13 g/dL (ref 11.6–15.9)
LYMPH#: 3.2 10*3/uL (ref 0.9–3.3)
LYMPH%: 25.4 % (ref 14.0–48.0)
MCH: 30 pg (ref 26.0–34.0)
MCHC: 32.4 g/dL (ref 32.0–36.0)
MCV: 93 fL (ref 81–101)
MONO#: 0.8 10*3/uL (ref 0.1–0.9)
MONO%: 6.2 % (ref 0.0–13.0)
NEUT%: 66.8 % (ref 39.6–80.0)
NEUTROS ABS: 8.4 10*3/uL — AB (ref 1.5–6.5)
Platelets: 252 10*3/uL (ref 145–400)
RBC: 4.33 10*6/uL (ref 3.70–5.32)
RDW: 13.8 % (ref 11.1–15.7)
WBC: 12.7 10*3/uL — ABNORMAL HIGH (ref 3.9–10.0)

## 2016-08-20 LAB — CMP (CANCER CENTER ONLY)
ALBUMIN: 3.3 g/dL (ref 3.3–5.5)
ALT(SGPT): 34 U/L (ref 10–47)
AST: 31 U/L (ref 11–38)
Alkaline Phosphatase: 102 U/L — ABNORMAL HIGH (ref 26–84)
BILIRUBIN TOTAL: 0.6 mg/dL (ref 0.20–1.60)
BUN, Bld: 14 mg/dL (ref 7–22)
CALCIUM: 9.3 mg/dL (ref 8.0–10.3)
CHLORIDE: 103 meq/L (ref 98–108)
CO2: 30 meq/L (ref 18–33)
Creat: 0.7 mg/dl (ref 0.6–1.2)
Glucose, Bld: 153 mg/dL — ABNORMAL HIGH (ref 73–118)
POTASSIUM: 4 meq/L (ref 3.3–4.7)
Sodium: 143 mEq/L (ref 128–145)
Total Protein: 7.6 g/dL (ref 6.4–8.1)

## 2016-08-20 MED ORDER — LANREOTIDE ACETATE 120 MG/0.5ML ~~LOC~~ SOLN
SUBCUTANEOUS | Status: AC
  Filled 2016-08-20: qty 120

## 2016-08-20 MED ORDER — LANREOTIDE ACETATE 120 MG/0.5ML ~~LOC~~ SOLN
120.0000 mg | Freq: Once | SUBCUTANEOUS | Status: AC
  Administered 2016-08-20: 120 mg via SUBCUTANEOUS

## 2016-08-20 NOTE — Progress Notes (Signed)
Hematology and Oncology Follow Up Visit  OKLA QAZI [id number] Jun 16, 1960 56 y.o. 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 Leah [name] is here today for a follow-up. Unfortunately, her mother-in-law, who we take care of for metastatic colon cancer, fell and broke her left hip. This was probably a mother so ago. She still is recuperating. Ms. Tison husband and her his brother are spending a lot of time taking care of her.  Ms. Wien is doing okay. She did have a little diarrhea yesterday.  There is no abdominal pain. She's having no cough or shortness of breath.  Her weight is a real problem. She really needs to lose some weight. If not, she probably will develop diabetes.  Her chromogranin A is 9. This is holding steady.  Overall, her performance status is ECOG 1.  Medications:  Allergies as of 08/20/2016   No Known Allergies     Medication List       Accurate as of 08/20/16  3:11 PM. Always use your most recent med list.          amLODipine 10 MG tablet Commonly known as:  NORVASC TAKE 1 TABLET (10 MG TOTAL) BY MOUTH DAILY.   aspirin 81 MG EC tablet Take 1 tablet (81 mg total) by mouth daily.   fexofenadine 180 MG tablet Commonly known as:  [employer] ALLERGY RELIEF Take 1 tablet (180 mg total) by mouth every morning.   fluticasone 50 MCG/ACT nasal spray Commonly known as:  FLONASE USE 2 SPRAYS IN BOTH NOSTRILS EVERY DAY   gabapentin 100 MG capsule Commonly known as:  NEURONTIN Take 1 capsule (100 mg total) by mouth at bedtime.   metoprolol succinate 100 MG 24 hr tablet Commonly known as:  TOPROL-XL TAKE 1 TABLET BY MOUTH EVERY DAY WITH OR IMMEDIATELY FOLLOWING A MEAL   montelukast 10 MG tablet Commonly known as:  SINGULAIR TAKE 1 TABLET BY MOUTH AT BEDTIME   octreotide  30 MG injection Commonly known as:  SANDOSTATIN LAR Inject 30 mg into the muscle every 28 (twenty-eight) days.   ondansetron 4 MG disintegrating tablet Commonly known as:  ZOFRAN-ODT DISSOLVE 1 TABLET BY MOUTH EVERY 8 HOURS AS NEEDED FOR NAUSEA AND VOMITING   OSPHENA 60 MG Tabs Generic drug:  Ospemifene TAKE ONE TABLET BY MOUTH DAILY   oxyCODONE 5 MG immediate release tablet Commonly known as:  ROXICODONE Take 1 tablet (5 mg total) by mouth every 4 (four) hours as needed for severe pain.   pantoprazole 40 MG tablet Commonly known as:  PROTONIX TAKE 1 TABLET (40 MG TOTAL) BY MOUTH DAILY.   PROAIR HFA 108 (90 Base) MCG/ACT inhaler Generic drug:  albuterol INHALE 2 PUFFS INTO THE LUNGS EVERY 6 (SIX) HOURS AS NEEDED. FOR SHORTNESS OF BREATH.   valACYclovir 500 MG tablet Commonly known as:  VALTREX Take 500 mg by mouth 2 (two) times daily as needed. Cold sores   valsartan 320 MG tablet Commonly known as:  DIOVAN TAKE 1 TABLET (320 MG TOTAL) BY MOUTH DAILY.   venlafaxine XR 150 MG 24 hr capsule Commonly known as:  EFFEXOR-XR TAKE 1 CAPSULE (150 MG TOTAL) BY MOUTH EACH MORNING       Allergies: No Known Allergies  Past Medical History, Surgical history, Social history, and Family History were reviewed and updated.  Review of  Systems: All other 10 point review of systems is negative.   Physical Exam:  weight is 231 lb (104.8 kg). Her oral temperature is 98.7 F (37.1 C). Her blood pressure is 141/84 (abnormal) and her pulse is 86. Her respiration is 18 and oxygen saturation is 99%.   Wt Readings from Last 3 Encounters:  08/20/16 231 lb (104.8 kg)  07/23/16 234 lb (106.1 kg)  06/18/16 232 lb (105.2 kg)    Well-developed and well-nourished white female. She is somewhat obese. Head and neck exam shows no ocular or oral lesions. There are no palpable cervical or supraclavicular lymph nodes. Lungs are clear. Cardiac exam regular rate and rhythm with no murmurs, rubs or  bruits. Abdomen is soft. She has healed laparotomy scar. There is no fluid wave. There is no abdominal mass. There is no palpable liver or spleen tip. Back exam shows no tenderness over the spine, ribs or hips. Extremities shows no clubbing, cyanosis or edema. Neurological exam shows no focal neurological deficits. Skin exam shows no rashes, ecchymoses or petechia.   Lab Results  Component Value Date   WBC 12.7 (H) 08/20/2016   HGB 13.0 08/20/2016   HCT 40.1 08/20/2016   MCV 93 08/20/2016   PLT 252 08/20/2016   Lab Results  Component Value Date   FERRITIN 382 (H) 10/18/2008   IRON <10 (L) 10/18/2008   TIBC NOT CALC Not calculated due to Iron <10. 10/18/2008   UIBC 156 10/18/2008   IRONPCTSAT NOT CALC Not calculated due to Iron <10. 10/18/2008   Lab Results  Component Value Date   RETICCTPCT 0.9 10/18/2008   RBC 4.33 08/20/2016   No results found for: KPAFRELGTCHN, LAMBDASER, KAPLAMBRATIO No results found for: IGGSERUM, IGA, IGMSERUM No results found for: Odetta Pink, SPEI   Chemistry      Component Value Date/Time   NA 143 08/20/2016 1346   NA 141 01/17/2016 1047   K 4.0 08/20/2016 1346   K 3.2 (L) 01/17/2016 1047   CL 103 08/20/2016 1346   CO2 30 08/20/2016 1346   CO2 27 01/17/2016 1047   BUN 14 08/20/2016 1346   BUN 6.6 (L) 01/17/2016 1047   CREATININE 0.7 08/20/2016 1346   CREATININE 0.7 01/17/2016 1047      Component Value Date/Time   CALCIUM 9.3 08/20/2016 1346   CALCIUM 8.7 01/17/2016 1047   ALKPHOS 102 (H) 08/20/2016 1346   ALKPHOS 90 01/17/2016 1047   AST 31 08/20/2016 1346   AST 34 01/17/2016 1047   ALT 34 08/20/2016 1346   ALT 38 01/17/2016 1047   BILITOT 0.60 08/20/2016 1346   BILITOT 0.42 01/17/2016 1047     Impression and Plan: Ms. Cerullo is 56 year old white female. She has a long-standing history of carcinoid.   From my point of view, everything seems be going pretty well. I don't see anything  that looks like progressive disease area  We will continue to follow her along. We will plan to get her back in one month.   [name], MD 7/11/20183:11 PM

## 2016-08-20 NOTE — Patient Instructions (Signed)
Lanreotide injection What is this medicine? LANREOTIDE (lan REE oh tide) is used to reduce blood levels of growth hormone in patients with a condition called acromegaly. It also works to slow or stop tumor growth in patients with neuroendocrine tumors and treat carcinoid syndrome. This medicine may be used for other purposes; ask your health care provider or pharmacist if you have questions. COMMON BRAND NAME(S): Somatuline [place] What should I tell my health care provider before I take this medicine? They need to know if you have any of these conditions: -diabetes -gallbladder disease -heart disease -kidney disease -liver disease -thyroid disease -an unusual or allergic reaction to lanreotide, other medicines, foods, dyes, or preservatives -pregnant or trying to get pregnant -breast-feeding How should I use this medicine? This medicine is for injection under the skin. It is given by a health care professional in a hospital or clinic setting. Contact your pediatrician or health care professional regarding the use of this medicine in children. Special care may be needed. Overdosage: If you think you have taken too much of this medicine contact a poison control center or emergency room at once. NOTE: This medicine is only for you. Do not share this medicine with others. What if I miss a dose? It is important not to miss your dose. Call your doctor or health care professional if you are unable to keep an appointment. What may interact with this medicine? This medicine may interact with the following medications: -bromocriptine -cyclosporine -certain medicines for blood pressure, heart disease, irregular heart beat -certain medicines for diabetes -quinidine -terfenadine This list may not describe all possible interactions. Give your health care provider a list of all the medicines, herbs, non-prescription drugs, or dietary supplements you use. Also tell them if you smoke, drink alcohol, or  use illegal drugs. Some items may interact with your medicine. What should I watch for while using this medicine? Tell your doctor or healthcare professional if your symptoms do not start to get better or if they get worse. Visit your doctor or health care professional for regular checks on your progress. Your condition will be monitored carefully while you are receiving this medicine. You may need blood work done while you are taking this medicine. Women should inform their doctor if they wish to become pregnant or think they might be pregnant. There is a potential for serious side effects to an unborn child. Talk to your health care professional or pharmacist for more information. Do not breast-feed an infant while taking this medicine or for 6 months after stopping it. This medicine has caused ovarian failure in some women. This medicine may interfere with the ability to have a child. Talk with your doctor or health care professional if you are concerned about your fertility. What side effects may I notice from receiving this medicine? Side effects that you should report to your doctor or health care professional as soon as possible: -allergic reactions like skin rash, itching or hives, swelling of the face, lips, or tongue -increased blood pressure -severe stomach pain -signs and symptoms of high blood sugar such as dizziness; dry mouth; dry skin; fruity breath; nausea; stomach pain; increased hunger or thirst; increased urination -signs and symptoms of low blood sugar such as feeling anxious; confusion; dizziness; increased hunger; unusually weak or tired; sweating; shakiness; cold; irritable; headache; blurred vision; fast heartbeat; loss of consciousness -unusually slow heartbeat Side effects that usually do not require medical attention (report to your doctor or health care professional if they continue   or are bothersome): -constipation -diarrhea -dizziness -headache -muscle pain -muscle  spasms -nausea -pain, redness, or irritation at site where injected This list may not describe all possible side effects. Call your doctor for medical advice about side effects. You may report side effects to FDA at 1-800-FDA-1088. Where should I keep my medicine? This drug is given in a hospital or clinic and will not be stored at home. NOTE: This sheet is a summary. It may not cover all possible information. If you have questions about this medicine, talk to your doctor, pharmacist, or health care provider.  2018 Elsevier/Gold Standard (2015-11-02 10:33:47)  

## 2016-08-22 LAB — CHROMOGRANIN A: Chromogranin A: 2 nmol/L (ref 0–5)

## 2016-09-08 ENCOUNTER — Encounter: Payer: Self-pay | Admitting: Hematology & Oncology

## 2016-09-13 ENCOUNTER — Other Ambulatory Visit: Payer: Self-pay | Admitting: Family

## 2016-09-15 ENCOUNTER — Ambulatory Visit (HOSPITAL_BASED_OUTPATIENT_CLINIC_OR_DEPARTMENT_OTHER): Payer: 59 | Attending: Otolaryngology | Admitting: Internal Medicine

## 2016-09-15 VITALS — Ht 59.5 in | Wt 234.0 lb

## 2016-09-15 DIAGNOSIS — Z79899 Other long term (current) drug therapy: Secondary | ICD-10-CM | POA: Insufficient documentation

## 2016-09-15 DIAGNOSIS — G4700 Insomnia, unspecified: Secondary | ICD-10-CM | POA: Diagnosis present

## 2016-09-15 DIAGNOSIS — G4761 Periodic limb movement disorder: Secondary | ICD-10-CM | POA: Diagnosis not present

## 2016-09-15 DIAGNOSIS — G4733 Obstructive sleep apnea (adult) (pediatric): Secondary | ICD-10-CM | POA: Insufficient documentation

## 2016-09-15 NOTE — Telephone Encounter (Signed)
Rx sent to pharmacy. LB 

## 2016-09-17 ENCOUNTER — Ambulatory Visit (HOSPITAL_BASED_OUTPATIENT_CLINIC_OR_DEPARTMENT_OTHER): Payer: 59 | Admitting: Family

## 2016-09-17 ENCOUNTER — Ambulatory Visit (HOSPITAL_BASED_OUTPATIENT_CLINIC_OR_DEPARTMENT_OTHER): Payer: 59

## 2016-09-17 ENCOUNTER — Other Ambulatory Visit (HOSPITAL_BASED_OUTPATIENT_CLINIC_OR_DEPARTMENT_OTHER): Payer: 59

## 2016-09-17 VITALS — BP 122/83 | HR 94 | Temp 98.8°F | Resp 18 | Wt 232.0 lb

## 2016-09-17 DIAGNOSIS — C7A00 Malignant carcinoid tumor of unspecified site: Secondary | ICD-10-CM

## 2016-09-17 DIAGNOSIS — C7B8 Other secondary neuroendocrine tumors: Secondary | ICD-10-CM

## 2016-09-17 DIAGNOSIS — C7B02 Secondary carcinoid tumors of liver: Secondary | ICD-10-CM | POA: Diagnosis not present

## 2016-09-17 DIAGNOSIS — C7A019 Malignant carcinoid tumor of the small intestine, unspecified portion: Secondary | ICD-10-CM

## 2016-09-17 LAB — CMP (CANCER CENTER ONLY)
ALBUMIN: 3.4 g/dL (ref 3.3–5.5)
ALK PHOS: 82 U/L (ref 26–84)
ALT: 36 U/L (ref 10–47)
AST: 42 U/L — AB (ref 11–38)
BILIRUBIN TOTAL: 0.7 mg/dL (ref 0.20–1.60)
BUN: 16 mg/dL (ref 7–22)
CHLORIDE: 103 meq/L (ref 98–108)
CO2: 25 mEq/L (ref 18–33)
CREATININE: 1 mg/dL (ref 0.6–1.2)
Calcium: 9.1 mg/dL (ref 8.0–10.3)
Glucose, Bld: 156 mg/dL — ABNORMAL HIGH (ref 73–118)
Potassium: 3.4 mEq/L (ref 3.3–4.7)
SODIUM: 140 meq/L (ref 128–145)
TOTAL PROTEIN: 6.9 g/dL (ref 6.4–8.1)

## 2016-09-17 LAB — CBC WITH DIFFERENTIAL (CANCER CENTER ONLY)
BASO#: 0 10*3/uL (ref 0.0–0.2)
BASO%: 0.3 % (ref 0.0–2.0)
EOS ABS: 0.2 10*3/uL (ref 0.0–0.5)
EOS%: 1.5 % (ref 0.0–7.0)
HCT: 38.8 % (ref 34.8–46.6)
HEMOGLOBIN: 13 g/dL (ref 11.6–15.9)
LYMPH#: 3.1 10*3/uL (ref 0.9–3.3)
LYMPH%: 25.5 % (ref 14.0–48.0)
MCH: 30.4 pg (ref 26.0–34.0)
MCHC: 33.5 g/dL (ref 32.0–36.0)
MCV: 91 fL (ref 81–101)
MONO#: 0.6 10*3/uL (ref 0.1–0.9)
MONO%: 5.1 % (ref 0.0–13.0)
NEUT%: 67.6 % (ref 39.6–80.0)
NEUTROS ABS: 8.2 10*3/uL — AB (ref 1.5–6.5)
PLATELETS: 230 10*3/uL (ref 145–400)
RBC: 4.27 10*6/uL (ref 3.70–5.32)
RDW: 13.7 % (ref 11.1–15.7)
WBC: 12.2 10*3/uL — AB (ref 3.9–10.0)

## 2016-09-17 MED ORDER — LANREOTIDE ACETATE 120 MG/0.5ML ~~LOC~~ SOLN
SUBCUTANEOUS | Status: AC
  Filled 2016-09-17: qty 120

## 2016-09-17 MED ORDER — LANREOTIDE ACETATE 120 MG/0.5ML ~~LOC~~ SOLN
120.0000 mg | Freq: Once | SUBCUTANEOUS | Status: AC
  Administered 2016-09-17: 120 mg via SUBCUTANEOUS

## 2016-09-17 NOTE — Patient Instructions (Signed)

## 2016-09-18 NOTE — Progress Notes (Signed)
Late entry due to computers being down. Assessments were done on 09/17/2016, nothing remarkable.

## 2016-09-18 NOTE — Progress Notes (Signed)
Hematology and Oncology Follow Up Visit  Leah [name] [id number] 11/01/1960 56 y.o. 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 She had some abdominal discomfort and queasiness last week. She states that this typically happens around the time she is due for her next injection. She denies nausea or vomiting.   Chromogranin A level in July was 2. Today's level is pending.  No fever, chills, cough, rash, dizziness, SOB, chest pain, palpitations, abdominal pain or changes in bowel or bladder habits at this time.  No swelling, tenderness, numbness or tingling in her extremities. No c/o pain at this time.  She has maintained a good appetite and is staying hydrated. Her weight is stable.   ECOG Performance Status: 1 - Symptomatic but completely ambulatory  Medications:  Allergies as of 09/17/2016   No Known Allergies     Medication List       Accurate as of 09/17/16 11:59 PM. Always use your most recent med list.          amLODipine 10 MG tablet Commonly known as:  NORVASC TAKE 1 TABLET (10 MG TOTAL) BY MOUTH DAILY.   aspirin 81 MG EC tablet Take 1 tablet (81 mg total) by mouth daily.   fexofenadine 180 MG tablet Commonly known as:  [employer] ALLERGY RELIEF Take 1 tablet (180 mg total) by mouth every morning.   fluticasone 50 MCG/ACT nasal spray Commonly known as:  FLONASE USE 2 SPRAYS IN BOTH NOSTRILS EVERY DAY   gabapentin 100 MG capsule Commonly known as:  NEURONTIN Take 1 capsule (100 mg total) by mouth at bedtime.   metoprolol succinate 100 MG 24 hr tablet Commonly known as:  TOPROL-XL TAKE 1 TABLET BY MOUTH EVERY DAY WITH OR IMMEDIATELY FOLLOWING A MEAL   montelukast 10 MG tablet Commonly known as:  SINGULAIR TAKE 1 TABLET BY MOUTH AT  BEDTIME   octreotide 30 MG injection Commonly known as:  SANDOSTATIN LAR Inject 30 mg into the muscle every 28 (twenty-eight) days.   ondansetron 4 MG disintegrating tablet Commonly known as:  ZOFRAN-ODT DISSOLVE 1 TABLET BY MOUTH EVERY 8 HOURS AS NEEDED FOR NAUSEA AND VOMITING   OSPHENA 60 MG Tabs Generic drug:  Ospemifene TAKE ONE TABLET BY MOUTH DAILY   oxyCODONE 5 MG immediate release tablet Commonly known as:  ROXICODONE Take 1 tablet (5 mg total) by mouth every 4 (four) hours as needed for severe pain.   pantoprazole 40 MG tablet Commonly known as:  PROTONIX TAKE 1 TABLET (40 MG TOTAL) BY MOUTH DAILY.   PROAIR HFA 108 (90 Base) MCG/ACT inhaler Generic drug:  albuterol INHALE 2 PUFFS INTO THE LUNGS EVERY 6 (SIX) HOURS AS NEEDED. FOR SHORTNESS OF BREATH.   valACYclovir 500 MG tablet Commonly known as:  VALTREX Take 500 mg by mouth 2 (two) times daily as needed. Cold sores   valsartan 320 MG tablet Commonly known as:  DIOVAN TAKE 1 TABLET (320 MG TOTAL) BY MOUTH DAILY.   venlafaxine XR 150 MG 24 hr capsule Commonly known as:  EFFEXOR-XR TAKE 1 CAPSULE (150 MG TOTAL) BY MOUTH EACH MORNING       Allergies: No Known Allergies  Past Medical History, Surgical history, Social history, and Family History were reviewed and updated.  Review of Systems: All other 10  point review of systems is negative.   Physical Exam:  weight is 232 lb (105.2 kg). Her oral temperature is 98.8 F (37.1 C). Her blood pressure is 122/83 and her pulse is 94. Her respiration is 18 and oxygen saturation is 98%.   Wt Readings from Last 3 Encounters:  09/17/16 232 lb (105.2 kg)  09/15/16 234 lb (106.1 kg)  08/20/16 231 lb (104.8 kg)    Ocular: Sclerae unicteric, pupils equal, round and reactive to light Ear-nose-throat: Oropharynx clear, dentition fair Lymphatic: No cervical, supraclavicular or axillary adenopathy Lungs no rales or rhonchi, good excursion bilaterally Heart regular rate  and rhythm, no murmur appreciated Abd soft, nontender, positive bowel sounds, no liver or spleen tip palpated on exam, no fluid wave MSK no focal spinal tenderness, no joint edema Neuro: non-focal, well-oriented, appropriate affect Breasts: Deferred   Lab Results  Component Value Date   WBC 12.2 (H) 09/17/2016   HGB 13.0 09/17/2016   HCT 38.8 09/17/2016   MCV 91 09/17/2016   PLT 230 09/17/2016   Lab Results  Component Value Date   FERRITIN 382 (H) 10/18/2008   IRON <10 (L) 10/18/2008   TIBC NOT CALC Not calculated due to Iron <10. 10/18/2008   UIBC 156 10/18/2008   IRONPCTSAT NOT CALC Not calculated due to Iron <10. 10/18/2008   Lab Results  Component Value Date   RETICCTPCT 0.9 10/18/2008   RBC 4.27 09/17/2016   No results found for: KPAFRELGTCHN, LAMBDASER, KAPLAMBRATIO No results found for: IGGSERUM, IGA, IGMSERUM No results found for: Odetta Pink, SPEI   Chemistry      Component Value Date/Time   NA 140 09/17/2016 1457   NA 141 01/17/2016 1047   K 3.4 09/17/2016 1457   K 3.2 (L) 01/17/2016 1047   CL 103 09/17/2016 1457   CO2 25 09/17/2016 1457   CO2 27 01/17/2016 1047   BUN 16 09/17/2016 1457   BUN 6.6 (L) 01/17/2016 1047   CREATININE 1.0 09/17/2016 1457   CREATININE 0.7 01/17/2016 1047      Component Value Date/Time   CALCIUM 9.1 09/17/2016 1457   CALCIUM 8.7 01/17/2016 1047   ALKPHOS 82 09/17/2016 1457   ALKPHOS 90 01/17/2016 1047   AST 42 (H) 09/17/2016 1457   AST 34 01/17/2016 1047   ALT 36 09/17/2016 1457   ALT 38 01/17/2016 1047   BILITOT 0.70 09/17/2016 1457   BILITOT 0.42 01/17/2016 1047      Impression and Plan: Leah [name] is a very pleasant 56 yo caucasian female with history of carcinoid with recurrence in the liver. She appears to be stable and doing well at this time.  We will proceed with her Somatuline injection today as planned.  We will continue to follow along with her and plan to  see her back again in another month.  Both she and her husband are good to contact our office with any questions or concerns. We can certainly see her sooner if need be.    Eliezer Bottom, NP 8/9/20189:10 AM

## 2016-09-19 LAB — CHROMOGRANIN A: CHROMOGRAN A: 2 nmol/L (ref 0–5)

## 2016-09-26 ENCOUNTER — Encounter: Payer: Self-pay | Admitting: Hematology & Oncology

## 2016-09-27 DIAGNOSIS — G4733 Obstructive sleep apnea (adult) (pediatric): Secondary | ICD-10-CM | POA: Diagnosis not present

## 2016-09-27 NOTE — Procedures (Signed)
Patient Name: Leah [name], Leah [name] Date: 09/15/2016 Gender: Female D.O.B: 04/02/1960 Age (years): 48 Referring Provider: Leonides Sake. Newman Height (inches): 60 Interpreting Physician: Baird Lyons MD, ABSM Weight (lbs): 234 RPSGT: Zadie Rhine BMI: 46 MRN: [id number] Neck Size: 16.50 CLINICAL INFORMATION Sleep Study Type: Split Night CPAP  Indication for sleep study: OSA, Snoring  Epworth Sleepiness Score: 0/ 24  SLEEP STUDY TECHNIQUE As per the AASM Manual for the Scoring of Sleep and Associated Events v2.3 (April 2016) with a hypopnea requiring 4% desaturations.  The channels recorded and monitored were frontal, central and occipital EEG, electrooculogram (EOG), submentalis EMG (chin), nasal and oral airflow, thoracic and abdominal wall motion, anterior tibialis EMG, snore microphone, electrocardiogram, and pulse oximetry. Continuous positive airway pressure (CPAP) was initiated when the patient met split night criteria and was titrated according to treat sleep-disordered breathing.  MEDICATIONS Medications self-administered by patient taken the night of the study : SINGHLAIR, ALEVE, DIOVAN, PROTONIX, ANALODAPINE  RESPIRATORY PARAMETERS Diagnostic  Total AHI (/hr): 23.3 RDI (/hr): 36.6 OA Index (/hr): 4 CA Index (/hr): 0.8 REM AHI (/hr): N/A NREM AHI (/hr): 23.3 Supine AHI (/hr): N/A Non-supine AHI (/hr): 23.30 Min O2 Sat (%): 86.00 Mean O2 (%): 93.24 Time below 88% (min): 0.4   Titration  Optimal Pressure (cm): 15 AHI at Optimal Pressure (/hr): 0.0 Min O2 at Optimal Pressure (%): 92.0 Supine % at Optimal (%): 100 Sleep % at Optimal (%): 88   SLEEP ARCHITECTURE The recording time for the entire night was 390.1 minutes.  During a baseline period of 222.6 minutes, the patient slept for 149.4 minutes in REM and nonREM, yielding a sleep efficiency of 67.1%. Sleep onset after lights out was 53.2 minutes with a REM latency of N/A minutes. The patient spent 10.04% of the  night in stage N1 sleep, 82.59% in stage N2 sleep, 7.36% in stage N3 and 0.00% in REM.  During the titration period of 164.2 minutes, the patient slept for 141.9 minutes in REM and nonREM, yielding a sleep efficiency of 86.4%. Sleep onset after CPAP initiation was 5.3 minutes with a REM latency of 94.0 minutes. The patient spent 5.99% of the night in stage N1 sleep, 65.11% in stage N2 sleep, 16.91% in stage N3 and 11.98% in REM.  CARDIAC DATA The 2 lead EKG demonstrated sinus rhythm. The mean heart rate was 75.18 beats per minute. Other EKG findings include: None.  LEG MOVEMENT DATA The total Periodic Limb Movements of Sleep (PLMS) were 130. The PLMS index was 26.72 .  IMPRESSIONS - Moderate obstructive sleep apnea occurred during the diagnostic portion of the study(AHI = 23.3/hour). An optimal PAP pressure was selected for this patient ( 15 cm of water) - No significant central sleep apnea occurred during the diagnostic portion of the study (CAI = 0.8/hour). - Moderate oxygen desaturation was noted during the diagnostic portion of the study (Min O2 =86.00%). - Snoring was audible during the diagnostic portion of the study. - No cardiac abnormalities were noted during this study. - Mild periodic limb movements of sleep occurred during the study.  DIAGNOSIS - Obstructive Sleep Apnea (327.23 [G47.33 ICD-10])  RECOMMENDATIONS - Trial of CPAP therapy on 15 cm H2O with a Small size Resmed Full Face Mask AirFit F20 mask and heated humidification. - Avoid alcohol, sedatives and other CNS depressants that may worsen sleep apnea and disrupt normal sleep architecture. - Sleep hygiene should be reviewed to assess factors that may improve sleep quality.  [Electronically signed] 09/27/2016 12:41 PM  Clinton  Young MD, [place], American Board of Sleep Medicine   NPI: [id number]  [place], American Board of Sleep Medicine  ELECTRONICALLY SIGNED ON:  09/27/2016, 12:39  PM [place] PH: [phone]   FX: [phone] [place]

## 2016-10-16 ENCOUNTER — Other Ambulatory Visit (HOSPITAL_BASED_OUTPATIENT_CLINIC_OR_DEPARTMENT_OTHER): Payer: 59

## 2016-10-16 ENCOUNTER — Ambulatory Visit (HOSPITAL_BASED_OUTPATIENT_CLINIC_OR_DEPARTMENT_OTHER): Payer: 59 | Admitting: Hematology & Oncology

## 2016-10-16 ENCOUNTER — Ambulatory Visit (HOSPITAL_BASED_OUTPATIENT_CLINIC_OR_DEPARTMENT_OTHER): Payer: 59

## 2016-10-16 VITALS — BP 142/74 | HR 87 | Temp 98.7°F | Wt 233.5 lb

## 2016-10-16 DIAGNOSIS — C7A019 Malignant carcinoid tumor of the small intestine, unspecified portion: Secondary | ICD-10-CM

## 2016-10-16 DIAGNOSIS — C7B02 Secondary carcinoid tumors of liver: Secondary | ICD-10-CM

## 2016-10-16 DIAGNOSIS — C7B8 Other secondary neuroendocrine tumors: Secondary | ICD-10-CM

## 2016-10-16 LAB — CBC WITH DIFFERENTIAL (CANCER CENTER ONLY)
BASO#: 0 10*3/uL (ref 0.0–0.2)
BASO%: 0.3 % (ref 0.0–2.0)
EOS%: 1.9 % (ref 0.0–7.0)
Eosinophils Absolute: 0.2 10*3/uL (ref 0.0–0.5)
HCT: 39.8 % (ref 34.8–46.6)
HGB: 13.2 g/dL (ref 11.6–15.9)
LYMPH#: 2.8 10*3/uL (ref 0.9–3.3)
LYMPH%: 28.9 % (ref 14.0–48.0)
MCH: 30.3 pg (ref 26.0–34.0)
MCHC: 33.2 g/dL (ref 32.0–36.0)
MCV: 91 fL (ref 81–101)
MONO#: 0.6 10*3/uL (ref 0.1–0.9)
MONO%: 6.1 % (ref 0.0–13.0)
NEUT#: 6.2 10*3/uL (ref 1.5–6.5)
NEUT%: 62.8 % (ref 39.6–80.0)
PLATELETS: 245 10*3/uL (ref 145–400)
RBC: 4.36 10*6/uL (ref 3.70–5.32)
RDW: 13.9 % (ref 11.1–15.7)
WBC: 9.8 10*3/uL (ref 3.9–10.0)

## 2016-10-16 LAB — CMP (CANCER CENTER ONLY)
ALK PHOS: 87 U/L — AB (ref 26–84)
ALT: 36 U/L (ref 10–47)
AST: 43 U/L — AB (ref 11–38)
Albumin: 3.4 g/dL (ref 3.3–5.5)
BILIRUBIN TOTAL: 0.7 mg/dL (ref 0.20–1.60)
BUN: 11 mg/dL (ref 7–22)
CO2: 26 mEq/L (ref 18–33)
Calcium: 9 mg/dL (ref 8.0–10.3)
Chloride: 102 mEq/L (ref 98–108)
Creat: 0.6 mg/dl (ref 0.6–1.2)
GLUCOSE: 140 mg/dL — AB (ref 73–118)
POTASSIUM: 4 meq/L (ref 3.3–4.7)
SODIUM: 140 meq/L (ref 128–145)
Total Protein: 7.2 g/dL (ref 6.4–8.1)

## 2016-10-16 MED ORDER — LANREOTIDE ACETATE 120 MG/0.5ML ~~LOC~~ SOLN
120.0000 mg | Freq: Once | SUBCUTANEOUS | Status: AC
  Administered 2016-10-16: 120 mg via SUBCUTANEOUS

## 2016-10-16 MED ORDER — LANREOTIDE ACETATE 120 MG/0.5ML ~~LOC~~ SOLN
SUBCUTANEOUS | Status: AC
  Filled 2016-10-16: qty 120

## 2016-10-16 NOTE — Progress Notes (Signed)
Hematology and Oncology Follow Up Visit  Leah [name] [id number] 1961-01-03 56 y.o. 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 She is doing pretty well. She had a nice Labor Day weekend. She's had a fairly busy summer.  She sees her Psychologist, sport and exercise at [facility] next week. I think she has her MRI of the liver next week.  Her chromogranin A level was 9 we checked it last time she was here.  She's had no abdominal pain. There is no nausea or vomiting. She's had no cough. She's had no bleeding. There is no fever.  She is trying to watch what she eats. She's trying to lose a little bit of weight.  Overall, her performance status is ECOG 1.   Medications:  Allergies as of 10/16/2016   No Known Allergies     Medication List       Accurate as of 10/16/16  2:40 PM. Always use your most recent med list.          amLODipine 10 MG tablet Commonly known as:  NORVASC TAKE 1 TABLET (10 MG TOTAL) BY MOUTH DAILY.   aspirin 81 MG EC tablet Take 1 tablet (81 mg total) by mouth daily.   fexofenadine 180 MG tablet Commonly known as:  [employer] ALLERGY RELIEF Take 1 tablet (180 mg total) by mouth every morning.   fluticasone 50 MCG/ACT nasal spray Commonly known as:  FLONASE USE 2 SPRAYS IN BOTH NOSTRILS EVERY DAY   gabapentin 100 MG capsule Commonly known as:  NEURONTIN Take 1 capsule (100 mg total) by mouth at bedtime.   metoprolol succinate 100 MG 24 hr tablet Commonly known as:  TOPROL-XL TAKE 1 TABLET BY MOUTH EVERY DAY WITH OR IMMEDIATELY FOLLOWING A MEAL   montelukast 10 MG tablet Commonly known as:  SINGULAIR TAKE 1 TABLET BY MOUTH AT BEDTIME   octreotide 30 MG injection Commonly known as:  SANDOSTATIN LAR Inject 30 mg into the muscle every 28 (twenty-eight)  days.   ondansetron 4 MG disintegrating tablet Commonly known as:  ZOFRAN-ODT DISSOLVE 1 TABLET BY MOUTH EVERY 8 HOURS AS NEEDED FOR NAUSEA AND VOMITING   OSPHENA 60 MG Tabs Generic drug:  Ospemifene TAKE ONE TABLET BY MOUTH DAILY   oxyCODONE 5 MG immediate release tablet Commonly known as:  ROXICODONE Take 1 tablet (5 mg total) by mouth every 4 (four) hours as needed for severe pain.   pantoprazole 40 MG tablet Commonly known as:  PROTONIX TAKE 1 TABLET (40 MG TOTAL) BY MOUTH DAILY.   PROAIR HFA 108 (90 Base) MCG/ACT inhaler Generic drug:  albuterol INHALE 2 PUFFS INTO THE LUNGS EVERY 6 (SIX) HOURS AS NEEDED. FOR SHORTNESS OF BREATH.   valACYclovir 500 MG tablet Commonly known as:  VALTREX Take 500 mg by mouth 2 (two) times daily as needed. Cold sores   valsartan 320 MG tablet Commonly known as:  DIOVAN TAKE 1 TABLET (320 MG TOTAL) BY MOUTH DAILY.   venlafaxine XR 150 MG 24 hr capsule Commonly known as:  EFFEXOR-XR TAKE 1 CAPSULE (150 MG TOTAL) BY MOUTH EACH MORNING       Allergies: No Known Allergies  Past Medical History, Surgical history, Social history, and Family History were reviewed and updated.  Review of Systems: All other 10 point review of systems  is negative.   Physical Exam:  weight is 233 lb 8 oz (105.9 kg). Her oral temperature is 98.7 F (37.1 C). Her blood pressure is 142/74 (abnormal) and her pulse is 87.   Wt Readings from Last 3 Encounters:  10/16/16 233 lb 8 oz (105.9 kg)  09/17/16 232 lb (105.2 kg)  09/15/16 234 lb (106.1 kg)    Physical Exam  Constitutional: She is oriented to person, place, and time.  HENT:  Head: Normocephalic and atraumatic.  Mouth/Throat: Oropharynx is clear and moist.  Eyes: Pupils are equal, round, and reactive to light. EOM are normal.  Neck: Normal range of motion.  Cardiovascular: Normal rate, regular rhythm and normal heart sounds.   Pulmonary/Chest: Effort normal and breath sounds normal.  Abdominal:  Soft. Bowel sounds are normal.  Musculoskeletal: Normal range of motion. She exhibits no edema, tenderness or deformity.  Lymphadenopathy:    She has no cervical adenopathy.  Neurological: She is alert and oriented to person, place, and time.  Skin: Skin is warm and dry. No rash noted. No erythema.  Psychiatric: She has a normal mood and affect. Her behavior is normal. Judgment and thought content normal.  Vitals reviewed.    Lab Results  Component Value Date   WBC 9.8 10/16/2016   HGB 13.2 10/16/2016   HCT 39.8 10/16/2016   MCV 91 10/16/2016   PLT 245 10/16/2016   Lab Results  Component Value Date   FERRITIN 382 (H) 10/18/2008   IRON <10 (L) 10/18/2008   TIBC NOT CALC Not calculated due to Iron <10. 10/18/2008   UIBC 156 10/18/2008   IRONPCTSAT NOT CALC Not calculated due to Iron <10. 10/18/2008   Lab Results  Component Value Date   RETICCTPCT 0.9 10/18/2008   RBC 4.36 10/16/2016   No results found for: KPAFRELGTCHN, LAMBDASER, KAPLAMBRATIO No results found for: IGGSERUM, IGA, IGMSERUM No results found for: Odetta Pink, SPEI   Chemistry      Component Value Date/Time   NA 140 10/16/2016 1316   NA 141 01/17/2016 1047   K 4.0 10/16/2016 1316   K 3.2 (L) 01/17/2016 1047   CL 102 10/16/2016 1316   CO2 26 10/16/2016 1316   CO2 27 01/17/2016 1047   BUN 11 10/16/2016 1316   BUN 6.6 (L) 01/17/2016 1047   CREATININE 0.6 10/16/2016 1316   CREATININE 0.7 01/17/2016 1047      Component Value Date/Time   CALCIUM 9.0 10/16/2016 1316   CALCIUM 8.7 01/17/2016 1047   ALKPHOS 87 (H) 10/16/2016 1316   ALKPHOS 90 01/17/2016 1047   AST 43 (H) 10/16/2016 1316   AST 34 01/17/2016 1047   ALT 36 10/16/2016 1316   ALT 38 01/17/2016 1047   BILITOT 0.70 10/16/2016 1316   BILITOT 0.42 01/17/2016 1047     Impression and Plan: Leah [name] is 56 year old white female. She has a long-standing history of carcinoid.   It will be  interesting to see what the MRI shows. She will have this next week.  Given that her chromogranin A is okay, I would not think that she would have active disease.  We will see her back in a month. I would like to see what her surgeon says over at [facility].  She will get her Somatuline. This always helps her. They really helps with the diarrhea.  [name], MD 9/6/20182:40 PM

## 2016-10-16 NOTE — Patient Instructions (Signed)
Lanreotide injection What is this medicine? LANREOTIDE (lan REE oh tide) is used to reduce blood levels of growth hormone in patients with a condition called acromegaly. It also works to slow or stop tumor growth in patients with neuroendocrine tumors and treat carcinoid syndrome. This medicine may be used for other purposes; ask your health care provider or pharmacist if you have questions. COMMON BRAND NAME(S): Somatuline [place] What should I tell my health care provider before I take this medicine? They need to know if you have any of these conditions: -diabetes -gallbladder disease -heart disease -kidney disease -liver disease -thyroid disease -an unusual or allergic reaction to lanreotide, other medicines, foods, dyes, or preservatives -pregnant or trying to get pregnant -breast-feeding How should I use this medicine? This medicine is for injection under the skin. It is given by a health care professional in a hospital or clinic setting. Contact your pediatrician or health care professional regarding the use of this medicine in children. Special care may be needed. Overdosage: If you think you have taken too much of this medicine contact a poison control center or emergency room at once. NOTE: This medicine is only for you. Do not share this medicine with others. What if I miss a dose? It is important not to miss your dose. Call your doctor or health care professional if you are unable to keep an appointment. What may interact with this medicine? This medicine may interact with the following medications: -bromocriptine -cyclosporine -certain medicines for blood pressure, heart disease, irregular heart beat -certain medicines for diabetes -quinidine -terfenadine This list may not describe all possible interactions. Give your health care provider a list of all the medicines, herbs, non-prescription drugs, or dietary supplements you use. Also tell them if you smoke, drink alcohol, or  use illegal drugs. Some items may interact with your medicine. What should I watch for while using this medicine? Tell your doctor or healthcare professional if your symptoms do not start to get better or if they get worse. Visit your doctor or health care professional for regular checks on your progress. Your condition will be monitored carefully while you are receiving this medicine. You may need blood work done while you are taking this medicine. Women should inform their doctor if they wish to become pregnant or think they might be pregnant. There is a potential for serious side effects to an unborn child. Talk to your health care professional or pharmacist for more information. Do not breast-feed an infant while taking this medicine or for 6 months after stopping it. This medicine has caused ovarian failure in some women. This medicine may interfere with the ability to have a child. Talk with your doctor or health care professional if you are concerned about your fertility. What side effects may I notice from receiving this medicine? Side effects that you should report to your doctor or health care professional as soon as possible: -allergic reactions like skin rash, itching or hives, swelling of the face, lips, or tongue -increased blood pressure -severe stomach pain -signs and symptoms of high blood sugar such as dizziness; dry mouth; dry skin; fruity breath; nausea; stomach pain; increased hunger or thirst; increased urination -signs and symptoms of low blood sugar such as feeling anxious; confusion; dizziness; increased hunger; unusually weak or tired; sweating; shakiness; cold; irritable; headache; blurred vision; fast heartbeat; loss of consciousness -unusually slow heartbeat Side effects that usually do not require medical attention (report to your doctor or health care professional if they continue   or are bothersome): -constipation -diarrhea -dizziness -headache -muscle pain -muscle  spasms -nausea -pain, redness, or irritation at site where injected This list may not describe all possible side effects. Call your doctor for medical advice about side effects. You may report side effects to FDA at 1-800-FDA-1088. Where should I keep my medicine? This drug is given in a hospital or clinic and will not be stored at home. NOTE: This sheet is a summary. It may not cover all possible information. If you have questions about this medicine, talk to your doctor, pharmacist, or health care provider.  2018 Elsevier/Gold Standard (2015-11-02 10:33:47)  

## 2016-10-17 LAB — CHROMOGRANIN A: CHROMOGRAN A: 3 nmol/L (ref 0–5)

## 2016-10-29 ENCOUNTER — Ambulatory Visit: Payer: 59 | Admitting: Family

## 2016-10-29 ENCOUNTER — Other Ambulatory Visit: Payer: 59

## 2016-10-29 ENCOUNTER — Ambulatory Visit: Payer: 59

## 2016-11-01 ENCOUNTER — Other Ambulatory Visit: Payer: Self-pay | Admitting: Family Medicine

## 2016-11-02 ENCOUNTER — Emergency Department (HOSPITAL_COMMUNITY): Payer: 59

## 2016-11-02 ENCOUNTER — Encounter (HOSPITAL_COMMUNITY): Payer: Self-pay | Admitting: Emergency Medicine

## 2016-11-02 ENCOUNTER — Emergency Department (HOSPITAL_COMMUNITY)
Admission: EM | Admit: 2016-11-02 | Discharge: 2016-11-02 | Disposition: A | Discharge status: Home / Self Care | Payer: 59 | Attending: Emergency Medicine | Admitting: Emergency Medicine

## 2016-11-02 DIAGNOSIS — S161XXA Strain of muscle, fascia and tendon at neck level, initial encounter: Secondary | ICD-10-CM | POA: Insufficient documentation

## 2016-11-02 DIAGNOSIS — Y929 Unspecified place or not applicable: Secondary | ICD-10-CM | POA: Insufficient documentation

## 2016-11-02 DIAGNOSIS — Y999 Unspecified external cause status: Secondary | ICD-10-CM | POA: Insufficient documentation

## 2016-11-02 DIAGNOSIS — I10 Essential (primary) hypertension: Secondary | ICD-10-CM | POA: Insufficient documentation

## 2016-11-02 DIAGNOSIS — Z79899 Other long term (current) drug therapy: Secondary | ICD-10-CM | POA: Diagnosis not present

## 2016-11-02 DIAGNOSIS — Y939 Activity, unspecified: Secondary | ICD-10-CM | POA: Insufficient documentation

## 2016-11-02 DIAGNOSIS — M542 Cervicalgia: Secondary | ICD-10-CM | POA: Diagnosis present

## 2016-11-02 MED ORDER — ONDANSETRON 4 MG PO TBDP
4.0000 mg | ORAL_TABLET | Freq: Once | ORAL | Status: AC
  Administered 2016-11-02: 4 mg via ORAL
  Filled 2016-11-02: qty 1

## 2016-11-02 MED ORDER — CYCLOBENZAPRINE HCL 10 MG PO TABS
10.0000 mg | ORAL_TABLET | Freq: Once | ORAL | Status: AC
  Administered 2016-11-02: 10 mg via ORAL
  Filled 2016-11-02: qty 1

## 2016-11-02 MED ORDER — CYCLOBENZAPRINE HCL 10 MG PO TABS: 10.0000 mg | ORAL_TABLET | Freq: Two times a day (BID) | ORAL | 0 refills | Status: DC | PRN
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
## 2016-11-02 NOTE — Discharge Instructions (Signed)
The muscle relaxant will make you sleepy.

## 2016-11-04 ENCOUNTER — Encounter: Payer: Self-pay | Admitting: Family Medicine

## 2016-11-11 ENCOUNTER — Other Ambulatory Visit: Payer: Self-pay | Admitting: Family Medicine

## 2016-11-11 DIAGNOSIS — J301 Allergic rhinitis due to pollen: Secondary | ICD-10-CM

## 2016-11-19 ENCOUNTER — Ambulatory Visit (HOSPITAL_BASED_OUTPATIENT_CLINIC_OR_DEPARTMENT_OTHER): Payer: 59

## 2016-11-19 ENCOUNTER — Ambulatory Visit (HOSPITAL_BASED_OUTPATIENT_CLINIC_OR_DEPARTMENT_OTHER): Payer: 59 | Admitting: Family

## 2016-11-19 ENCOUNTER — Other Ambulatory Visit (HOSPITAL_BASED_OUTPATIENT_CLINIC_OR_DEPARTMENT_OTHER): Payer: 59

## 2016-11-19 VITALS — BP 122/53 | HR 99 | Temp 98.0°F | Resp 19 | Wt 235.0 lb

## 2016-11-19 DIAGNOSIS — C7B02 Secondary carcinoid tumors of liver: Secondary | ICD-10-CM

## 2016-11-19 DIAGNOSIS — C7A00 Malignant carcinoid tumor of unspecified site: Secondary | ICD-10-CM

## 2016-11-19 DIAGNOSIS — C7B8 Other secondary neuroendocrine tumors: Secondary | ICD-10-CM

## 2016-11-19 DIAGNOSIS — F39 Unspecified mood [affective] disorder: Secondary | ICD-10-CM

## 2016-11-19 LAB — CMP (CANCER CENTER ONLY)
ALBUMIN: 3.4 g/dL (ref 3.3–5.5)
ALT(SGPT): 41 U/L (ref 10–47)
AST: 40 U/L — AB (ref 11–38)
Alkaline Phosphatase: 91 U/L — ABNORMAL HIGH (ref 26–84)
BUN, Bld: 15 mg/dL (ref 7–22)
CALCIUM: 9.4 mg/dL (ref 8.0–10.3)
CHLORIDE: 104 meq/L (ref 98–108)
CO2: 27 meq/L (ref 18–33)
Creat: 0.8 mg/dl (ref 0.6–1.2)
GLUCOSE: 146 mg/dL — AB (ref 73–118)
POTASSIUM: 3.7 meq/L (ref 3.3–4.7)
Sodium: 144 mEq/L (ref 128–145)
Total Bilirubin: 0.6 mg/dl (ref 0.20–1.60)
Total Protein: 7.4 g/dL (ref 6.4–8.1)

## 2016-11-19 LAB — CBC WITH DIFFERENTIAL (CANCER CENTER ONLY)
BASO#: 0.1 10*3/uL (ref 0.0–0.2)
BASO%: 0.4 % (ref 0.0–2.0)
EOS ABS: 0.1 10*3/uL (ref 0.0–0.5)
EOS%: 1 % (ref 0.0–7.0)
HEMATOCRIT: 40.4 % (ref 34.8–46.6)
HGB: 13.3 g/dL (ref 11.6–15.9)
LYMPH#: 3.5 10*3/uL — AB (ref 0.9–3.3)
LYMPH%: 28.2 % (ref 14.0–48.0)
MCH: 31 pg (ref 26.0–34.0)
MCHC: 32.9 g/dL (ref 32.0–36.0)
MCV: 94 fL (ref 81–101)
MONO#: 0.7 10*3/uL (ref 0.1–0.9)
MONO%: 5.9 % (ref 0.0–13.0)
NEUT#: 8 10*3/uL — ABNORMAL HIGH (ref 1.5–6.5)
NEUT%: 64.5 % (ref 39.6–80.0)
Platelets: 284 10*3/uL (ref 145–400)
RBC: 4.29 10*6/uL (ref 3.70–5.32)
RDW: 13.5 % (ref 11.1–15.7)
WBC: 12.4 10*3/uL — ABNORMAL HIGH (ref 3.9–10.0)

## 2016-11-19 MED ORDER — LANREOTIDE ACETATE 120 MG/0.5ML ~~LOC~~ SOLN
SUBCUTANEOUS | Status: AC
  Filled 2016-11-19: qty 120

## 2016-11-19 MED ORDER — LANREOTIDE ACETATE 120 MG/0.5ML ~~LOC~~ SOLN
120.0000 mg | Freq: Once | SUBCUTANEOUS | Status: AC
  Administered 2016-11-19: 120 mg via SUBCUTANEOUS

## 2016-11-19 NOTE — Patient Instructions (Signed)
Lanreotide injection What is this medicine? LANREOTIDE (lan REE oh tide) is used to reduce blood levels of growth hormone in patients with a condition called acromegaly. It also works to slow or stop tumor growth in patients with neuroendocrine tumors and treat carcinoid syndrome. This medicine may be used for other purposes; ask your health care provider or pharmacist if you have questions. COMMON BRAND NAME(S): Somatuline [place] What should I tell my health care provider before I take this medicine? They need to know if you have any of these conditions: -diabetes -gallbladder disease -heart disease -kidney disease -liver disease -thyroid disease -an unusual or allergic reaction to lanreotide, other medicines, foods, dyes, or preservatives -pregnant or trying to get pregnant -breast-feeding How should I use this medicine? This medicine is for injection under the skin. It is given by a health care professional in a hospital or clinic setting. Contact your pediatrician or health care professional regarding the use of this medicine in children. Special care may be needed. Overdosage: If you think you have taken too much of this medicine contact a poison control center or emergency room at once. NOTE: This medicine is only for you. Do not share this medicine with others. What if I miss a dose? It is important not to miss your dose. Call your doctor or health care professional if you are unable to keep an appointment. What may interact with this medicine? This medicine may interact with the following medications: -bromocriptine -cyclosporine -certain medicines for blood pressure, heart disease, irregular heart beat -certain medicines for diabetes -quinidine -terfenadine This list may not describe all possible interactions. Give your health care provider a list of all the medicines, herbs, non-prescription drugs, or dietary supplements you use. Also tell them if you smoke, drink alcohol, or  use illegal drugs. Some items may interact with your medicine. What should I watch for while using this medicine? Tell your doctor or healthcare professional if your symptoms do not start to get better or if they get worse. Visit your doctor or health care professional for regular checks on your progress. Your condition will be monitored carefully while you are receiving this medicine. You may need blood work done while you are taking this medicine. Women should inform their doctor if they wish to become pregnant or think they might be pregnant. There is a potential for serious side effects to an unborn child. Talk to your health care professional or pharmacist for more information. Do not breast-feed an infant while taking this medicine or for 6 months after stopping it. This medicine has caused ovarian failure in some women. This medicine may interfere with the ability to have a child. Talk with your doctor or health care professional if you are concerned about your fertility. What side effects may I notice from receiving this medicine? Side effects that you should report to your doctor or health care professional as soon as possible: -allergic reactions like skin rash, itching or hives, swelling of the face, lips, or tongue -increased blood pressure -severe stomach pain -signs and symptoms of high blood sugar such as dizziness; dry mouth; dry skin; fruity breath; nausea; stomach pain; increased hunger or thirst; increased urination -signs and symptoms of low blood sugar such as feeling anxious; confusion; dizziness; increased hunger; unusually weak or tired; sweating; shakiness; cold; irritable; headache; blurred vision; fast heartbeat; loss of consciousness -unusually slow heartbeat Side effects that usually do not require medical attention (report to your doctor or health care professional if they continue   or are bothersome): -constipation -diarrhea -dizziness -headache -muscle pain -muscle  spasms -nausea -pain, redness, or irritation at site where injected This list may not describe all possible side effects. Call your doctor for medical advice about side effects. You may report side effects to FDA at 1-800-FDA-1088. Where should I keep my medicine? This drug is given in a hospital or clinic and will not be stored at home. NOTE: This sheet is a summary. It may not cover all possible information. If you have questions about this medicine, talk to your doctor, pharmacist, or health care provider.  2018 Elsevier/Gold Standard (2015-11-02 10:33:47)  

## 2016-11-19 NOTE — Progress Notes (Signed)
Hematology and Oncology Follow Up Visit  Leah [name] [id number] [birth year] 56 y.o. 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 She is feeling tired and having some mood swings she state occur when she is due for her injection. She denies pain and has no other complaints at this time.  She states that she saw her surgeon Dr. Crisoforo Oxford at [facility] last week and her MRI showed stable disease. She will follow up with him again and have a repeat scan in another 6 months.  Chromogranin A in September was 3.  No fever, chills, n/v, cough, rash, dizziness, SOB, chest pain, palpitations or changes in bowel or bladder habits.  No lymphadenopathy found on exam. No episodes of bleeding, bruising or petechiae.  No swelling, tenderness, numbness or tingling in her extremities. No c/o pain.  She has a good appetite and is staying hydrated. Her weight is stable.   ECOG Performance Status: 1 - Symptomatic but completely ambulatory  Medications:  Allergies as of 11/19/2016   No Known Allergies     Medication List       Accurate as of 11/19/16  3:30 PM. Always use your most recent med list.          amLODipine 10 MG tablet Commonly known as:  NORVASC TAKE 1 TABLET (10 MG TOTAL) BY MOUTH DAILY.   aspirin 81 MG EC tablet Take 1 tablet (81 mg total) by mouth daily.   cyclobenzaprine 10 MG tablet Commonly known as:  FLEXERIL Take 1 tablet (10 mg total) by mouth 2 (two) times daily as needed for muscle spasms.   fexofenadine 180 MG tablet Commonly known as:  [employer] ALLERGY RELIEF Take 1 tablet (180 mg total) by mouth every morning.   fluticasone 50 MCG/ACT nasal spray Commonly known as:  FLONASE USE 2 SPRAYS IN BOTH NOSTRILS EVERY DAY   gabapentin 100 MG  capsule Commonly known as:  NEURONTIN Take 1 capsule (100 mg total) by mouth at bedtime.   metoprolol succinate 100 MG 24 hr tablet Commonly known as:  TOPROL-XL TAKE 1 TABLET BY MOUTH EVERY EVENING WITH OR IMMEDIATELY FOLLOWING A MEAL   montelukast 10 MG tablet Commonly known as:  SINGULAIR TAKE 1 TABLET BY MOUTH AT BEDTIME   octreotide 30 MG injection Commonly known as:  SANDOSTATIN LAR Inject 30 mg into the muscle every 28 (twenty-eight) days.   ondansetron 4 MG disintegrating tablet Commonly known as:  ZOFRAN-ODT DISSOLVE 1 TABLET BY MOUTH EVERY 8 HOURS AS NEEDED FOR NAUSEA AND VOMITING   OSPHENA 60 MG Tabs Generic drug:  Ospemifene TAKE ONE TABLET BY MOUTH DAILY   oxyCODONE 5 MG immediate release tablet Commonly known as:  ROXICODONE Take 1 tablet (5 mg total) by mouth every 4 (four) hours as needed for severe pain.   pantoprazole 40 MG tablet Commonly known as:  PROTONIX TAKE 1 TABLET (40 MG TOTAL) BY MOUTH DAILY.   PROAIR HFA 108 (90 Base) MCG/ACT inhaler Generic drug:  albuterol INHALE 2 PUFFS INTO THE LUNGS EVERY 6 (SIX) HOURS AS NEEDED. FOR SHORTNESS OF BREATH.   valACYclovir 500 MG tablet Commonly known as:  VALTREX Take 500 mg by mouth 2 (two) times daily as needed. Cold sores   valsartan 320 MG tablet Commonly known as:  DIOVAN TAKE  1 TABLET (320 MG TOTAL) BY MOUTH DAILY.   venlafaxine XR 150 MG 24 hr capsule Commonly known as:  EFFEXOR-XR TAKE 1 CAPSULE (150 MG TOTAL) BY MOUTH EACH MORNING       Allergies: No Known Allergies  Past Medical History, Surgical history, Social history, and Family History were reviewed and updated.  Review of Systems: All other 10 point review of systems is negative.   Physical Exam:  weight is 235 lb (106.6 kg). Her oral temperature is 98 F (36.7 C). Her blood pressure is 122/53 (abnormal) and her pulse is 99. Her respiration is 19 and oxygen saturation is 99%.   Wt Readings from Last 3 Encounters:  11/19/16  235 lb (106.6 kg)  11/02/16 233 lb (105.7 kg)  10/16/16 233 lb 8 oz (105.9 kg)    Ocular: Sclerae unicteric, pupils equal, round and reactive to light Ear-nose-throat: Oropharynx clear, dentition fair Lymphatic: No cervical, supraclavicular or axillary adenopathy Lungs no rales or rhonchi, good excursion bilaterally Heart regular rate and rhythm, no murmur appreciated Abd soft, nontender, positive bowel sounds, no liver or spleen tip palpated on exam, no liver or spleen tip MSK no focal spinal tenderness, no joint edema Neuro: non-focal, well-oriented, appropriate affect Breasts: Deferred   Lab Results  Component Value Date   WBC 12.4 (H) 11/19/2016   HGB 13.3 11/19/2016   HCT 40.4 11/19/2016   MCV 94 11/19/2016   PLT 284 11/19/2016   Lab Results  Component Value Date   FERRITIN 382 (H) 10/18/2008   IRON <10 (L) 10/18/2008   TIBC NOT CALC Not calculated due to Iron <10. 10/18/2008   UIBC 156 10/18/2008   IRONPCTSAT NOT CALC Not calculated due to Iron <10. 10/18/2008   Lab Results  Component Value Date   RETICCTPCT 0.9 10/18/2008   RBC 4.29 11/19/2016   No results found for: KPAFRELGTCHN, LAMBDASER, KAPLAMBRATIO No results found for: IGGSERUM, IGA, IGMSERUM No results found for: Odetta Pink, SPEI   Chemistry      Component Value Date/Time   NA 144 11/19/2016 1328   NA 141 01/17/2016 1047   K 3.7 11/19/2016 1328   K 3.2 (L) 01/17/2016 1047   CL 104 11/19/2016 1328   CO2 27 11/19/2016 1328   CO2 27 01/17/2016 1047   BUN 15 11/19/2016 1328   BUN 6.6 (L) 01/17/2016 1047   CREATININE 0.8 11/19/2016 1328   CREATININE 0.7 01/17/2016 1047      Component Value Date/Time   CALCIUM 9.4 11/19/2016 1328   CALCIUM 8.7 01/17/2016 1047   ALKPHOS 91 (H) 11/19/2016 1328   ALKPHOS 90 01/17/2016 1047   AST 40 (H) 11/19/2016 1328   AST 34 01/17/2016 1047   ALT 41 11/19/2016 1328   ALT 38 01/17/2016 1047   BILITOT 0.60  11/19/2016 1328   BILITOT 0.42 01/17/2016 1047      Impression and Plan: Leah [name] is a very pleasant 56 yo caucasian female with a long history of carcinoid and recurrent neuroendocrine liver metastasis. She is doing well and her MRI last week at [facility] with Dr. Crisoforo Oxford showed stable disease.  She will get her Somatuline injection today as planned and will stay on her monthly injection schedule.  We will plan to see her back again in another month for follow-up and repeat lab work.  She will contact our office with any questions or concerns. We can certainly see her sooner if need be.   Eliezer Bottom, NP  10/10/20183:30 PM

## 2016-11-20 LAB — CHROMOGRANIN A: CHROMOGRAN A: 3 nmol/L (ref 0–5)

## 2016-11-22 ENCOUNTER — Encounter: Payer: Self-pay | Admitting: Hematology & Oncology

## 2016-11-27 ENCOUNTER — Other Ambulatory Visit: Payer: Self-pay | Admitting: Family Medicine

## 2016-12-08 ENCOUNTER — Encounter: Payer: Self-pay | Admitting: Family Medicine

## 2016-12-15 ENCOUNTER — Ambulatory Visit (INDEPENDENT_AMBULATORY_CARE_PROVIDER_SITE_OTHER): Payer: 59 | Admitting: Family Medicine

## 2016-12-15 ENCOUNTER — Encounter: Payer: Self-pay | Admitting: Family Medicine

## 2016-12-15 VITALS — BP 132/86 | HR 77 | Temp 98.5°F | Ht 59.0 in | Wt 235.8 lb

## 2016-12-15 DIAGNOSIS — G4733 Obstructive sleep apnea (adult) (pediatric): Secondary | ICD-10-CM | POA: Diagnosis not present

## 2016-12-15 DIAGNOSIS — R911 Solitary pulmonary nodule: Secondary | ICD-10-CM | POA: Diagnosis not present

## 2016-12-15 DIAGNOSIS — Z23 Encounter for immunization: Secondary | ICD-10-CM | POA: Diagnosis not present

## 2016-12-15 DIAGNOSIS — L570 Actinic keratosis: Secondary | ICD-10-CM

## 2016-12-15 DIAGNOSIS — Z9989 Dependence on other enabling machines and devices: Secondary | ICD-10-CM | POA: Diagnosis not present

## 2016-12-15 NOTE — Telephone Encounter (Signed)
Patient requesting shingles shot same day as physical. Is this possible? If not, please call patient to advise.

## 2016-12-15 NOTE — Progress Notes (Signed)
Leah [name] is a 56 y.o. female is here for follow up.  History of Present Illness:   HPI:   1. OSA on CPAP. Doing well. Feels much better using CPAP. No complications. Tolerating well.    2. Need for prophylactic vaccination and inoculation against varicella   3. Solitary pulmonary nodule. Due for repeat CT.     4. Skin nodule. Back of leg. Will not go away.    Health Maintenance Due  Topic Date Due  . MAMMOGRAM  08/24/2016  . HEMOGLOBIN A1C  10/12/2016  . OPHTHALMOLOGY EXAM  12/11/2016   Depression screen [facility] 2/9 12/15/2016 05/03/2013 04/19/2013  Decreased Interest 0 0 0  Down, Depressed, Hopeless 0 0 0  PHQ - 2 Score 0 0 0  Some recent data might be hidden   PMHx, SurgHx, SocialHx, FamHx, Medications, and Allergies were reviewed in the Visit Navigator and updated as appropriate.   Patient Active Problem List   Diagnosis Date Noted  . Atrophic vaginitis 04/12/2016  . Hepatic encephalopathy ([place]) 04/12/2016  . Osteopenia 09/27/2015  . Branch retinal vein occlusion of left eye 12/12/2014  . Hypertensive retinopathy of both eyes 12/12/2014  . Lung nodule seen on imaging study 05/01/2014  . Diabetes mellitus type 2, controlled ([place]) 03/17/2013  . Morbid obesity ([place]) 03/01/2013  . 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 Retinopathy 01/18/2013  . 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 Malignant neoplasm of breast ([place]) 01/08/2011  . 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 Hypertension 10/22/2010  . GERD (gastroesophageal reflux disease) 10/22/2010  . Allergic rhinitis 10/22/2010  . History of bilateral mastectomy 10/22/2010  . Sarcoidosis 10/22/2010  . Osteoarthritis 10/22/2010  . Anxiety 10/22/2010   Social History   Tobacco Use  . Smoking status: Never Smoker  . Smokeless tobacco: Never Used  . Tobacco comment: never used tobacco  Substance Use Topics  . Alcohol use: No    Alcohol/week: 0.0 oz  . Drug use: No   Current Medications and  Allergies:   Current Outpatient Medications:  .  amLODipine (NORVASC) 10 MG tablet, TAKE 1 TABLET (10 MG TOTAL) BY MOUTH DAILY., Disp: 90 tablet, Rfl: 1 .  aspirin 81 MG EC tablet, Take 1 tablet (81 mg total) by mouth daily., Disp: 90 tablet, Rfl: 0 .  fexofenadine ([employer] ALLERGY RELIEF) 180 MG tablet, Take 1 tablet (180 mg total) by mouth every morning., Disp: 90 tablet, Rfl: 3 .  fluticasone (FLONASE) 50 MCG/ACT nasal spray, USE 2 SPRAYS IN BOTH NOSTRILS EVERY DAY, Disp: 16 g, Rfl: 2 .  gabapentin (NEURONTIN) 100 MG capsule, Take 1 capsule (100 mg total) by mouth at bedtime., Disp: 90 capsule, Rfl: 3 .  metoprolol succinate (TOPROL-XL) 100 MG 24 hr tablet, TAKE 1 TABLET BY MOUTH EVERY EVENING WITH OR IMMEDIATELY FOLLOWING A MEAL, Disp: 90 tablet, Rfl: 0 .  montelukast (SINGULAIR) 10 MG tablet, TAKE 1 TABLET BY MOUTH AT BEDTIME, Disp: 90 tablet, Rfl: 0 .  octreotide (SANDOSTATIN LAR) 30 MG injection, Inject 30 mg into the muscle every 28 (twenty-eight) days., Disp: , Rfl:  .  ondansetron (ZOFRAN-ODT) 4 MG disintegrating tablet, DISSOLVE 1 TABLET BY MOUTH EVERY 8 HOURS AS NEEDED FOR NAUSEA AND VOMITING, Disp: 90 tablet, Rfl: 0 .  OSPHENA 60 MG TABS, TAKE ONE TABLET BY MOUTH DAILY, Disp: 90 tablet, Rfl: 3 .  oxyCODONE (ROXICODONE) 5 MG immediate release tablet, Take 1 tablet (5 mg total) by mouth every 4 (four) hours as needed for  severe pain., Disp: 30 tablet, Rfl: 0 .  pantoprazole (PROTONIX) 40 MG tablet, TAKE 1 TABLET (40 MG TOTAL) BY MOUTH DAILY. (Patient taking differently: TAKE 1 TABLET (40 MG TOTAL) BY MOUTH TWICE DAILY), Disp: 90 tablet, Rfl: 1 .  PROAIR HFA 108 (90 Base) MCG/ACT inhaler, INHALE 2 PUFFS INTO THE LUNGS EVERY 6 (SIX) HOURS AS NEEDED. FOR SHORTNESS OF BREATH., Disp: 6.7 Inhaler, Rfl: 4 .  valACYclovir (VALTREX) 500 MG tablet, Take 500 mg by mouth 2 (two) times daily as needed. Cold sores, Disp: , Rfl:  .  valsartan (DIOVAN) 320 MG tablet, TAKE 1 TABLET (320 MG TOTAL) BY MOUTH  DAILY., Disp: 90 tablet, Rfl: 1 .  venlafaxine XR (EFFEXOR-XR) 150 MG 24 hr capsule, TAKE 1 CAPSULE (150 MG TOTAL) BY MOUTH EACH MORNING, Disp: 90 capsule, Rfl: 3  No Known Allergies   Review of Systems   Pertinent items are noted in the HPI. Otherwise, ROS is negative.  Vitals:   Vitals:   12/15/16 1348  BP: 132/86  Pulse: 77  Temp: 98.5 F (36.9 C)  TempSrc: Oral  SpO2: 96%  Weight: 235 lb 12.8 oz (107 kg)  Height: 4\' 11"  (1.499 m)     Body mass index is 47.63 kg/m.   Physical Exam:   Physical Exam  Constitutional: She appears well-nourished.  HENT:  Head: Normocephalic and atraumatic.  Eyes: EOM are normal. Pupils are equal, round, and reactive to light.  Neck: Normal range of motion. Neck supple.  Cardiovascular: Normal rate, regular rhythm, normal heart sounds and intact distal pulses.  Pulmonary/Chest: Effort normal.  Abdominal: Soft.  Skin: Skin is warm.  2-3 mm AK on posterior right calf.  Psychiatric: She has a normal mood and affect. Her behavior is normal.  Nursing note and vitals reviewed.   Assessment and Plan:   Diagnoses and all orders for this visit:  OSA on CPAP Comments: Doing well. Continue current treatment.   Need for prophylactic vaccination and inoculation against varicella -     Varicella-zoster vaccine IM  Solitary pulmonary nodule Comments: Repeat CT ordered. Orders: -     CT Chest W Contrast; Future  Actinic keratosis Comments: Histofreeze today.   . Reviewed expectations re: course of current medical issues. . Discussed self-management of symptoms. . Outlined signs and symptoms indicating need for more acute intervention. . Patient verbalized understanding and all questions were answered. Marland Kitchen Health Maintenance issues including appropriate healthy diet, exercise, and smoking avoidance were discussed with patient. . See orders for this visit as documented in the electronic medical record. . Patient received an After Visit  Summary.  Briscoe Deutscher, DO [place], [place] 12/15/2016  Future Appointments  Date Time Provider [place]  12/19/2016  1:15 PM [facility] LAB [facility] None  12/19/2016  1:45 PM [place], Holli Humbles, NP [facility] None  12/19/2016  2:15 PM [facility] B2 [facility] None  05/05/2017 10:00 AM Briscoe Deutscher, DO [facility] None

## 2016-12-19 ENCOUNTER — Other Ambulatory Visit: Payer: Self-pay

## 2016-12-19 ENCOUNTER — Encounter: Payer: Self-pay | Admitting: Family

## 2016-12-19 ENCOUNTER — Ambulatory Visit (HOSPITAL_BASED_OUTPATIENT_CLINIC_OR_DEPARTMENT_OTHER): Payer: 59

## 2016-12-19 ENCOUNTER — Other Ambulatory Visit (HOSPITAL_BASED_OUTPATIENT_CLINIC_OR_DEPARTMENT_OTHER): Payer: 59

## 2016-12-19 ENCOUNTER — Ambulatory Visit (HOSPITAL_BASED_OUTPATIENT_CLINIC_OR_DEPARTMENT_OTHER): Payer: 59 | Admitting: Family

## 2016-12-19 VITALS — BP 151/84 | HR 73 | Temp 98.5°F | Resp 18 | Wt 235.0 lb

## 2016-12-19 DIAGNOSIS — C7B8 Other secondary neuroendocrine tumors: Secondary | ICD-10-CM

## 2016-12-19 DIAGNOSIS — C7B02 Secondary carcinoid tumors of liver: Secondary | ICD-10-CM

## 2016-12-19 DIAGNOSIS — C7A00 Malignant carcinoid tumor of unspecified site: Secondary | ICD-10-CM

## 2016-12-19 LAB — CBC WITH DIFFERENTIAL (CANCER CENTER ONLY)
BASO#: 0 10*3/uL (ref 0.0–0.2)
BASO%: 0.2 % (ref 0.0–2.0)
EOS ABS: 0.1 10*3/uL (ref 0.0–0.5)
EOS%: 1.4 % (ref 0.0–7.0)
HEMATOCRIT: 39.2 % (ref 34.8–46.6)
HEMOGLOBIN: 12.9 g/dL (ref 11.6–15.9)
LYMPH#: 2.6 10*3/uL (ref 0.9–3.3)
LYMPH%: 25.6 % (ref 14.0–48.0)
MCH: 30.4 pg (ref 26.0–34.0)
MCHC: 32.9 g/dL (ref 32.0–36.0)
MCV: 92 fL (ref 81–101)
MONO#: 0.6 10*3/uL (ref 0.1–0.9)
MONO%: 5.7 % (ref 0.0–13.0)
NEUT%: 67.1 % (ref 39.6–80.0)
NEUTROS ABS: 6.9 10*3/uL — AB (ref 1.5–6.5)
Platelets: 267 10*3/uL (ref 145–400)
RBC: 4.25 10*6/uL (ref 3.70–5.32)
RDW: 13.5 % (ref 11.1–15.7)
WBC: 10.3 10*3/uL — AB (ref 3.9–10.0)

## 2016-12-19 LAB — CMP (CANCER CENTER ONLY)
ALBUMIN: 3.2 g/dL — AB (ref 3.3–5.5)
ALK PHOS: 90 U/L — AB (ref 26–84)
ALT: 34 U/L (ref 10–47)
AST: 31 U/L (ref 11–38)
BILIRUBIN TOTAL: 0.6 mg/dL (ref 0.20–1.60)
BUN, Bld: 15 mg/dL (ref 7–22)
CALCIUM: 9 mg/dL (ref 8.0–10.3)
CO2: 27 meq/L (ref 18–33)
CREATININE: 0.8 mg/dL (ref 0.6–1.2)
Chloride: 103 mEq/L (ref 98–108)
Glucose, Bld: 217 mg/dL — ABNORMAL HIGH (ref 73–118)
Potassium: 3.6 mEq/L (ref 3.3–4.7)
SODIUM: 144 meq/L (ref 128–145)
TOTAL PROTEIN: 7.4 g/dL (ref 6.4–8.1)

## 2016-12-19 MED ORDER — LANREOTIDE ACETATE 120 MG/0.5ML ~~LOC~~ SOLN
SUBCUTANEOUS | Status: AC
  Filled 2016-12-19: qty 120

## 2016-12-19 MED ORDER — LANREOTIDE ACETATE 120 MG/0.5ML ~~LOC~~ SOLN
120.0000 mg | Freq: Once | SUBCUTANEOUS | Status: AC
  Administered 2016-12-19: 120 mg via SUBCUTANEOUS

## 2016-12-19 NOTE — Progress Notes (Signed)
Hematology and Oncology Follow Up Visit  Leah [name] [id number] 28-Aug-1960 56 y.o. 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 She is doing well and has complaints at this time.  Chromogranin A was 3 last month. Today's level is pending.   No fever, chills, n/v, cough, rash, dizziness, SOB, chest pain, palpitations, abdominal pain or changes in bowel or bladder habits.  No swelling, tenderness, numbness or tingling in her extremities. No c/o pain.  She has a good appetite and is staying well hydrated. Her weight is stable.   ECOG Performance Status: 1 - Symptomatic but completely ambulatory  Medications:  Allergies as of 12/19/2016   No Known Allergies     Medication List        Accurate as of 12/19/16  2:34 PM. Always use your most recent med list.          amLODipine 10 MG tablet Commonly known as:  NORVASC TAKE 1 TABLET (10 MG TOTAL) BY MOUTH DAILY.   aspirin 81 MG EC tablet Take 1 tablet (81 mg total) by mouth daily.   fexofenadine 180 MG tablet Commonly known as:  [employer] ALLERGY RELIEF Take 1 tablet (180 mg total) by mouth every morning.   fluticasone 50 MCG/ACT nasal spray Commonly known as:  FLONASE USE 2 SPRAYS IN BOTH NOSTRILS EVERY DAY   gabapentin 100 MG capsule Commonly known as:  NEURONTIN Take 1 capsule (100 mg total) by mouth at bedtime.   metoprolol succinate 100 MG 24 hr tablet Commonly known as:  TOPROL-XL TAKE 1 TABLET BY MOUTH EVERY EVENING WITH OR IMMEDIATELY FOLLOWING A MEAL   montelukast 10 MG tablet Commonly known as:  SINGULAIR TAKE 1 TABLET BY MOUTH AT BEDTIME   octreotide 30 MG injection Commonly known as:  SANDOSTATIN LAR Inject 30 mg into the muscle every 28 (twenty-eight) days.     ondansetron 4 MG disintegrating tablet Commonly known as:  ZOFRAN-ODT DISSOLVE 1 TABLET BY MOUTH EVERY 8 HOURS AS NEEDED FOR NAUSEA AND VOMITING   OSPHENA 60 MG Tabs Generic drug:  Ospemifene TAKE ONE TABLET BY MOUTH DAILY   oxyCODONE 5 MG immediate release tablet Commonly known as:  ROXICODONE Take 1 tablet (5 mg total) by mouth every 4 (four) hours as needed for severe pain.   pantoprazole 40 MG tablet Commonly known as:  PROTONIX TAKE 1 TABLET (40 MG TOTAL) BY MOUTH DAILY.   PROAIR HFA 108 (90 Base) MCG/ACT inhaler Generic drug:  albuterol INHALE 2 PUFFS INTO THE LUNGS EVERY 6 (SIX) HOURS AS NEEDED. FOR SHORTNESS OF BREATH.   valACYclovir 500 MG tablet Commonly known as:  VALTREX Take 500 mg by mouth 2 (two) times daily as needed. Cold sores   valsartan 320 MG tablet Commonly known as:  DIOVAN TAKE 1 TABLET (320 MG TOTAL) BY MOUTH DAILY.   venlafaxine XR 150 MG 24 hr capsule Commonly known as:  EFFEXOR-XR TAKE 1 CAPSULE (150 MG TOTAL) BY MOUTH EACH MORNING       Allergies: No Known Allergies  Past Medical History, Surgical history, Social history, and Family History were reviewed and updated.  Review of Systems: All other 10 point review of systems is negative.   Physical Exam:  weight is 235 lb (106.6 kg). Her oral temperature is 98.5 F (  36.9 C). Her blood pressure is 151/84 (abnormal) and her pulse is 73. Her respiration is 18 and oxygen saturation is 99%.   Wt Readings from Last 3 Encounters:  12/19/16 235 lb (106.6 kg)  12/15/16 235 lb 12.8 oz (107 kg)  11/19/16 235 lb (106.6 kg)    Ocular: Sclerae unicteric, pupils equal, round and reactive to light Ear-nose-throat: Oropharynx clear, dentition fair Lymphatic: No cervical, supraclavicular or axillary adenopathy Lungs no rales or rhonchi, good excursion bilaterally Heart regular rate and rhythm, no murmur appreciated Abd soft, nontender, positive bowel sounds, no liver or spleen tip palpated on exam,  no fluid wave  MSK no focal spinal tenderness, no joint edema Neuro: non-focal, well-oriented, appropriate affect Breasts: Deferred   Lab Results  Component Value Date   WBC 10.3 (H) 12/19/2016   HGB 12.9 12/19/2016   HCT 39.2 12/19/2016   MCV 92 12/19/2016   PLT 267 12/19/2016   Lab Results  Component Value Date   FERRITIN 382 (H) 10/18/2008   IRON <10 (L) 10/18/2008   TIBC NOT CALC Not calculated due to Iron <10. 10/18/2008   UIBC 156 10/18/2008   IRONPCTSAT NOT CALC Not calculated due to Iron <10. 10/18/2008   Lab Results  Component Value Date   RETICCTPCT 0.9 10/18/2008   RBC 4.25 12/19/2016   No results found for: KPAFRELGTCHN, LAMBDASER, KAPLAMBRATIO No results found for: IGGSERUM, IGA, IGMSERUM No results found for: Odetta Pink, SPEI   Chemistry      Component Value Date/Time   NA 144 12/19/2016 1354   NA 141 01/17/2016 1047   K 3.6 12/19/2016 1354   K 3.2 (L) 01/17/2016 1047   CL 103 12/19/2016 1354   CO2 27 12/19/2016 1354   CO2 27 01/17/2016 1047   BUN 15 12/19/2016 1354   BUN 6.6 (L) 01/17/2016 1047   CREATININE 0.8 12/19/2016 1354   CREATININE 0.7 01/17/2016 1047      Component Value Date/Time   CALCIUM 9.0 12/19/2016 1354   CALCIUM 8.7 01/17/2016 1047   ALKPHOS 90 (H) 12/19/2016 1354   ALKPHOS 90 01/17/2016 1047   AST 31 12/19/2016 1354   AST 34 01/17/2016 1047   ALT 34 12/19/2016 1354   ALT 38 01/17/2016 1047   BILITOT 0.60 12/19/2016 1354   BILITOT 0.42 01/17/2016 1047      Impression and Plan: Leah [name] is a very pleasant 56 yo caucasian female with a long history of carcinoid and recurrent neuroendocrine liver metastasis. She is doing well at this time and has no complaints.  Chromogranin A in October was 3. Today's level is pending.  She follows up with Dr. Crisoforo Oxford again in 5 months with repeat MRI.  We will proceed with her injection today as planned.  We will see her back in another  month for repeat lab and follow-up.  She will contact our office with any questions or concerns. We can certainly see her sooner if need be.   Eliezer Bottom, NP 11/9/20182:34 PM

## 2016-12-22 LAB — CHROMOGRANIN A: CHROMOGRAN A: 5 nmol/L (ref 0–5)

## 2016-12-24 ENCOUNTER — Other Ambulatory Visit: Payer: Self-pay | Admitting: Family Medicine

## 2016-12-31 ENCOUNTER — Ambulatory Visit (INDEPENDENT_AMBULATORY_CARE_PROVIDER_SITE_OTHER)
Admission: RE | Admit: 2016-12-31 | Discharge: 2016-12-31 | Disposition: A | Discharge status: Home / Self Care | Payer: 59 | Source: Ambulatory Visit | Attending: Family Medicine | Admitting: Family Medicine

## 2016-12-31 DIAGNOSIS — R911 Solitary pulmonary nodule: Secondary | ICD-10-CM

## 2017-01-10 ENCOUNTER — Encounter: Payer: Self-pay | Admitting: Hematology & Oncology

## 2017-01-13 ENCOUNTER — Encounter: Payer: Self-pay | Admitting: Family Medicine

## 2017-01-15 ENCOUNTER — Ambulatory Visit (HOSPITAL_BASED_OUTPATIENT_CLINIC_OR_DEPARTMENT_OTHER): Payer: 59

## 2017-01-15 DIAGNOSIS — C7B02 Secondary carcinoid tumors of liver: Secondary | ICD-10-CM

## 2017-01-15 DIAGNOSIS — C7A00 Malignant carcinoid tumor of unspecified site: Secondary | ICD-10-CM

## 2017-01-15 DIAGNOSIS — C7B8 Other secondary neuroendocrine tumors: Secondary | ICD-10-CM

## 2017-01-15 MED ORDER — LANREOTIDE ACETATE 120 MG/0.5ML ~~LOC~~ SOLN
SUBCUTANEOUS | Status: AC
  Filled 2017-01-15: qty 120

## 2017-01-15 MED ORDER — LANREOTIDE ACETATE 120 MG/0.5ML ~~LOC~~ SOLN
120.0000 mg | Freq: Once | SUBCUTANEOUS | Status: AC
  Administered 2017-01-15: 120 mg via SUBCUTANEOUS

## 2017-01-15 NOTE — Patient Instructions (Signed)
Lanreotide injection What is this medicine? LANREOTIDE (lan REE oh tide) is used to reduce blood levels of growth hormone in patients with a condition called acromegaly. It also works to slow or stop tumor growth in patients with neuroendocrine tumors and treat carcinoid syndrome. This medicine may be used for other purposes; ask your health care provider or pharmacist if you have questions. COMMON BRAND NAME(S): Somatuline [place] What should I tell my health care provider before I take this medicine? They need to know if you have any of these conditions: -diabetes -gallbladder disease -heart disease -kidney disease -liver disease -thyroid disease -an unusual or allergic reaction to lanreotide, other medicines, foods, dyes, or preservatives -pregnant or trying to get pregnant -breast-feeding How should I use this medicine? This medicine is for injection under the skin. It is given by a health care professional in a hospital or clinic setting. Contact your pediatrician or health care professional regarding the use of this medicine in children. Special care may be needed. Overdosage: If you think you have taken too much of this medicine contact a poison control center or emergency room at once. NOTE: This medicine is only for you. Do not share this medicine with others. What if I miss a dose? It is important not to miss your dose. Call your doctor or health care professional if you are unable to keep an appointment. What may interact with this medicine? This medicine may interact with the following medications: -bromocriptine -cyclosporine -certain medicines for blood pressure, heart disease, irregular heart beat -certain medicines for diabetes -quinidine -terfenadine This list may not describe all possible interactions. Give your health care provider a list of all the medicines, herbs, non-prescription drugs, or dietary supplements you use. Also tell them if you smoke, drink alcohol, or  use illegal drugs. Some items may interact with your medicine. What should I watch for while using this medicine? Tell your doctor or healthcare professional if your symptoms do not start to get better or if they get worse. Visit your doctor or health care professional for regular checks on your progress. Your condition will be monitored carefully while you are receiving this medicine. You may need blood work done while you are taking this medicine. Women should inform their doctor if they wish to become pregnant or think they might be pregnant. There is a potential for serious side effects to an unborn child. Talk to your health care professional or pharmacist for more information. Do not breast-feed an infant while taking this medicine or for 6 months after stopping it. This medicine has caused ovarian failure in some women. This medicine may interfere with the ability to have a child. Talk with your doctor or health care professional if you are concerned about your fertility. What side effects may I notice from receiving this medicine? Side effects that you should report to your doctor or health care professional as soon as possible: -allergic reactions like skin rash, itching or hives, swelling of the face, lips, or tongue -increased blood pressure -severe stomach pain -signs and symptoms of high blood sugar such as dizziness; dry mouth; dry skin; fruity breath; nausea; stomach pain; increased hunger or thirst; increased urination -signs and symptoms of low blood sugar such as feeling anxious; confusion; dizziness; increased hunger; unusually weak or tired; sweating; shakiness; cold; irritable; headache; blurred vision; fast heartbeat; loss of consciousness -unusually slow heartbeat Side effects that usually do not require medical attention (report to your doctor or health care professional if they continue   or are bothersome): -constipation -diarrhea -dizziness -headache -muscle pain -muscle  spasms -nausea -pain, redness, or irritation at site where injected This list may not describe all possible side effects. Call your doctor for medical advice about side effects. You may report side effects to FDA at 1-800-FDA-1088. Where should I keep my medicine? This drug is given in a hospital or clinic and will not be stored at home. NOTE: This sheet is a summary. It may not cover all possible information. If you have questions about this medicine, talk to your doctor, pharmacist, or health care provider.  2018 Elsevier/Gold Standard (2015-11-02 10:33:47)  

## 2017-01-16 ENCOUNTER — Other Ambulatory Visit: Payer: Self-pay | Admitting: Family Medicine

## 2017-01-16 DIAGNOSIS — M4722 Other spondylosis with radiculopathy, cervical region: Secondary | ICD-10-CM

## 2017-01-16 NOTE — Telephone Encounter (Signed)
Please advise on refill.

## 2017-01-19 ENCOUNTER — Other Ambulatory Visit: Payer: 59

## 2017-01-19 ENCOUNTER — Ambulatory Visit: Payer: 59 | Admitting: Family

## 2017-01-19 ENCOUNTER — Ambulatory Visit: Payer: 59

## 2017-01-20 ENCOUNTER — Other Ambulatory Visit: Payer: Self-pay

## 2017-01-20 ENCOUNTER — Ambulatory Visit: Payer: 59

## 2017-01-20 ENCOUNTER — Ambulatory Visit
Admission: RE | Admit: 2017-01-20 | Discharge: 2017-01-20 | Disposition: A | Discharge status: Home / Self Care | Payer: 59 | Source: Ambulatory Visit | Attending: Family Medicine | Admitting: Family Medicine

## 2017-01-20 ENCOUNTER — Other Ambulatory Visit (HOSPITAL_BASED_OUTPATIENT_CLINIC_OR_DEPARTMENT_OTHER): Payer: 59

## 2017-01-20 ENCOUNTER — Other Ambulatory Visit: Payer: Self-pay | Admitting: Family Medicine

## 2017-01-20 ENCOUNTER — Ambulatory Visit (HOSPITAL_BASED_OUTPATIENT_CLINIC_OR_DEPARTMENT_OTHER): Payer: 59 | Admitting: Hematology & Oncology

## 2017-01-20 VITALS — BP 132/82 | HR 97 | Temp 98.0°F | Resp 17 | Wt 235.8 lb

## 2017-01-20 DIAGNOSIS — C7A00 Malignant carcinoid tumor of unspecified site: Secondary | ICD-10-CM

## 2017-01-20 DIAGNOSIS — N641 Fat necrosis of breast: Secondary | ICD-10-CM

## 2017-01-20 DIAGNOSIS — C7B8 Other secondary neuroendocrine tumors: Secondary | ICD-10-CM

## 2017-01-20 DIAGNOSIS — C7B02 Secondary carcinoid tumors of liver: Secondary | ICD-10-CM

## 2017-01-20 DIAGNOSIS — R635 Abnormal weight gain: Secondary | ICD-10-CM

## 2017-01-20 DIAGNOSIS — Z853 Personal history of malignant neoplasm of breast: Secondary | ICD-10-CM

## 2017-01-20 DIAGNOSIS — Z9882 Breast implant status: Secondary | ICD-10-CM

## 2017-01-20 LAB — CBC WITH DIFFERENTIAL (CANCER CENTER ONLY)
BASO#: 0 10*3/uL (ref 0.0–0.2)
BASO%: 0.4 % (ref 0.0–2.0)
EOS%: 1.3 % (ref 0.0–7.0)
Eosinophils Absolute: 0.1 10*3/uL (ref 0.0–0.5)
HCT: 39.8 % (ref 34.8–46.6)
HGB: 13.1 g/dL (ref 11.6–15.9)
LYMPH#: 2.9 10*3/uL (ref 0.9–3.3)
LYMPH%: 26.1 % (ref 14.0–48.0)
MCH: 30.3 pg (ref 26.0–34.0)
MCHC: 32.9 g/dL (ref 32.0–36.0)
MCV: 92 fL (ref 81–101)
MONO#: 0.9 10*3/uL (ref 0.1–0.9)
MONO%: 7.8 % (ref 0.0–13.0)
NEUT#: 7.2 10*3/uL — ABNORMAL HIGH (ref 1.5–6.5)
NEUT%: 64.4 % (ref 39.6–80.0)
PLATELETS: 282 10*3/uL (ref 145–400)
RBC: 4.33 10*6/uL (ref 3.70–5.32)
RDW: 13.7 % (ref 11.1–15.7)
WBC: 11.1 10*3/uL — AB (ref 3.9–10.0)

## 2017-01-20 LAB — CMP (CANCER CENTER ONLY)
ALK PHOS: 84 U/L (ref 26–84)
ALT: 35 U/L (ref 10–47)
AST: 37 U/L (ref 11–38)
Albumin: 3.3 g/dL (ref 3.3–5.5)
BILIRUBIN TOTAL: 0.6 mg/dL (ref 0.20–1.60)
BUN: 15 mg/dL (ref 7–22)
CO2: 26 mEq/L (ref 18–33)
CREATININE: 1.1 mg/dL (ref 0.6–1.2)
Calcium: 9.1 mg/dL (ref 8.0–10.3)
Chloride: 102 mEq/L (ref 98–108)
Glucose, Bld: 159 mg/dL — ABNORMAL HIGH (ref 73–118)
POTASSIUM: 3.9 meq/L (ref 3.3–4.7)
SODIUM: 140 meq/L (ref 128–145)
TOTAL PROTEIN: 7.2 g/dL (ref 6.4–8.1)

## 2017-01-20 MED ORDER — LANREOTIDE ACETATE 120 MG/0.5ML ~~LOC~~ SOLN
SUBCUTANEOUS | Status: AC
  Filled 2017-01-20: qty 120

## 2017-01-20 NOTE — Progress Notes (Signed)
Hematology and Oncology Follow Up Visit  Leah [name] [id number] May 10, 1960 56 y.o. 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  She is doing quite well.  She has had no problems since we last saw her.  I think her biggest issue is her weight.  I think she really has to start working on losing weight.  If not, this will eventually become a huge problem.    She has had no problems with diarrhea.  She has had no flushing.  There is been no cough.  She has had no shortness of breath.  Her last chromogranin A was normal at 5.  She has had no problems with rashes.  Is been no leg swelling.  She had no abdominal pain.  I think she goes back to see her surgeon at [facility] in about 3 months.    Overall, her performance status is ECOG 1.   Medications:  Allergies as of 01/20/2017   No Known Allergies     Medication List        Accurate as of 01/20/17  2:37 PM. Always use your most recent med list.          amLODipine 10 MG tablet Commonly known as:  NORVASC TAKE 1 TABLET (10 MG TOTAL) BY MOUTH DAILY.   aspirin 81 MG EC tablet Take 1 tablet (81 mg total) by mouth daily.   fexofenadine 180 MG tablet Commonly known as:  [employer] ALLERGY RELIEF Take 1 tablet (180 mg total) by mouth every morning.   fluticasone 50 MCG/ACT nasal spray Commonly known as:  FLONASE USE 2 SPRAYS IN BOTH NOSTRILS EVERY DAY   gabapentin 100 MG capsule Commonly known as:  NEURONTIN Take 1 capsule (100 mg total) by mouth at bedtime.   metoprolol succinate 100 MG 24 hr tablet Commonly known as:  TOPROL-XL TAKE 1 TABLET BY MOUTH EVERY EVENING WITH OR IMMEDIATELY FOLLOWING A MEAL   montelukast 10 MG tablet Commonly known as:  SINGULAIR TAKE 1 TABLET BY MOUTH AT BEDTIME     naproxen 500 MG tablet Commonly known as:  NAPROSYN TAKE 1 TABLET (500 MG TOTAL) BY MOUTH 2 (TWO) TIMES DAILY WITH A MEAL.   octreotide 30 MG injection Commonly known as:  SANDOSTATIN LAR Inject 30 mg into the muscle every 28 (twenty-eight) days.   ondansetron 4 MG disintegrating tablet Commonly known as:  ZOFRAN-ODT DISSOLVE 1 TABLET BY MOUTH EVERY 8 HOURS AS NEEDED FOR NAUSEA AND VOMITING   OSPHENA 60 MG Tabs Generic drug:  Ospemifene TAKE ONE TABLET BY MOUTH DAILY   oxyCODONE 5 MG immediate release tablet Commonly known as:  ROXICODONE Take 1 tablet (5 mg total) by mouth every 4 (four) hours as needed for severe pain.   pantoprazole 40 MG tablet Commonly known as:  PROTONIX TAKE 1 TABLET (40 MG TOTAL) BY MOUTH DAILY.   PROAIR HFA 108 (90 Base) MCG/ACT inhaler Generic drug:  albuterol INHALE 2 PUFFS INTO THE LUNGS EVERY 6 (SIX) HOURS AS NEEDED. FOR SHORTNESS OF BREATH.   valACYclovir 500 MG tablet Commonly known as:  VALTREX Take 500 mg by mouth 2 (two) times daily as needed. Cold sores   valsartan 320 MG tablet Commonly known as:  DIOVAN TAKE 1 TABLET (320 MG TOTAL) BY MOUTH DAILY.  venlafaxine XR 150 MG 24 hr capsule Commonly known as:  EFFEXOR-XR TAKE 1 CAPSULE (150 MG TOTAL) BY MOUTH EACH MORNING       Allergies: No Known Allergies  Past Medical History, Surgical history, Social history, and Family History were reviewed and updated.  Review of Systems: All other 10 point review of systems is negative.   Physical Exam:  weight is 235 lb 12.8 oz (107 kg). Her oral temperature is 98 F (36.7 C). Her blood pressure is 132/82 and her pulse is 97. Her respiration is 17 and oxygen saturation is 98%.   Wt Readings from Last 3 Encounters:  01/20/17 235 lb 12.8 oz (107 kg)  12/19/16 235 lb (106.6 kg)  12/15/16 235 lb 12.8 oz (107 kg)    Physical Exam  Constitutional: She is oriented to person, place, and time.  HENT:  Head: Normocephalic and atraumatic.   Mouth/Throat: Oropharynx is clear and moist.  Eyes: EOM are normal. Pupils are equal, round, and reactive to light.  Neck: Normal range of motion.  Cardiovascular: Normal rate, regular rhythm and normal heart sounds.  Pulmonary/Chest: Effort normal and breath sounds normal.  Abdominal: Soft. Bowel sounds are normal.  Musculoskeletal: Normal range of motion. She exhibits no edema, tenderness or deformity.  Lymphadenopathy:    She has no cervical adenopathy.  Neurological: She is alert and oriented to person, place, and time.  Skin: Skin is warm and dry. No rash noted. No erythema.  Psychiatric: She has a normal mood and affect. Her behavior is normal. Judgment and thought content normal.  Vitals reviewed.    Lab Results  Component Value Date   WBC 11.1 (H) 01/20/2017   HGB 13.1 01/20/2017   HCT 39.8 01/20/2017   MCV 92 01/20/2017   PLT 282 01/20/2017   Lab Results  Component Value Date   FERRITIN 382 (H) 10/18/2008   IRON <10 (L) 10/18/2008   TIBC NOT CALC Not calculated due to Iron <10. 10/18/2008   UIBC 156 10/18/2008   IRONPCTSAT NOT CALC Not calculated due to Iron <10. 10/18/2008   Lab Results  Component Value Date   RETICCTPCT 0.9 10/18/2008   RBC 4.33 01/20/2017   No results found for: KPAFRELGTCHN, LAMBDASER, KAPLAMBRATIO No results found for: IGGSERUM, IGA, IGMSERUM No results found for: Odetta Pink, SPEI   Chemistry      Component Value Date/Time   NA 144 12/19/2016 1354   NA 141 01/17/2016 1047   K 3.6 12/19/2016 1354   K 3.2 (L) 01/17/2016 1047   CL 103 12/19/2016 1354   CO2 27 12/19/2016 1354   CO2 27 01/17/2016 1047   BUN 15 12/19/2016 1354   BUN 6.6 (L) 01/17/2016 1047   CREATININE 0.8 12/19/2016 1354   CREATININE 0.7 01/17/2016 1047      Component Value Date/Time   CALCIUM 9.0 12/19/2016 1354   CALCIUM 8.7 01/17/2016 1047   ALKPHOS 90 (H) 12/19/2016 1354   ALKPHOS 90 01/17/2016 1047    AST 31 12/19/2016 1354   AST 34 01/17/2016 1047   ALT 34 12/19/2016 1354   ALT 38 01/17/2016 1047   BILITOT 0.60 12/19/2016 1354   BILITOT 0.42 01/17/2016 1047     Impression and Plan: Leah [name] is 56 year old white female. She has a long-standing history of carcinoid.   From my point of view, I think she is done incredibly well.  We will go ahead and plan to see her in 3 months.  She comes in monthly for her Somatuline injection.  I know that she will work hard on trying to lose weight.    [name], MD 12/11/20182:37 PM

## 2017-01-21 LAB — CHROMOGRANIN A: CHROMOGRAN A: 2 nmol/L (ref 0–5)

## 2017-01-23 ENCOUNTER — Encounter: Payer: Self-pay | Admitting: Family

## 2017-01-28 ENCOUNTER — Encounter: Payer: Self-pay | Admitting: Family Medicine

## 2017-01-28 ENCOUNTER — Ambulatory Visit (INDEPENDENT_AMBULATORY_CARE_PROVIDER_SITE_OTHER): Payer: 59 | Admitting: Family Medicine

## 2017-01-28 VITALS — BP 130/82 | HR 88 | Temp 98.6°F | Wt 234.8 lb

## 2017-01-28 DIAGNOSIS — E119 Type 2 diabetes mellitus without complications: Secondary | ICD-10-CM

## 2017-01-28 DIAGNOSIS — M25562 Pain in left knee: Secondary | ICD-10-CM | POA: Diagnosis not present

## 2017-01-28 DIAGNOSIS — G8929 Other chronic pain: Secondary | ICD-10-CM

## 2017-01-28 LAB — POCT GLYCOSYLATED HEMOGLOBIN (HGB A1C): Hemoglobin A1C: 7.3

## 2017-01-28 MED ORDER — PHENTERMINE-TOPIRAMATE ER 7.5-46 MG PO CP24: 7.5000 mg | ORAL_CAPSULE | Freq: Every day | ORAL | 0 refills | Status: DC

## 2017-01-28 MED ORDER — PHENTERMINE-TOPIRAMATE ER 3.75-23 MG PO CP24: 3.7500 mg | ORAL_CAPSULE | Freq: Every day | ORAL | 0 refills | Status: DC

## 2017-01-28 NOTE — Progress Notes (Addendum)
Leah [name] is a 56 y.o. female is here for follow up.  History of Present Illness:   HPI: See Assessment and Plan section for Problem Based Charting of issues discussed today.  Health Maintenance Due  Topic Date Due  . OPHTHALMOLOGY EXAM  12/11/2016  . FOOT EXAM  01/20/2017   Depression screen [facility] 2/9 12/15/2016 05/03/2013 04/19/2013  Decreased Interest 0 0 0  Down, Depressed, Hopeless 0 0 0  PHQ - 2 Score 0 0 0  Some recent data might be hidden   PMHx, SurgHx, SocialHx, FamHx, Medications, and Allergies were reviewed in the Visit Navigator and updated as appropriate.   Patient Active Problem List   Diagnosis Date Noted  . Atrophic vaginitis 04/12/2016  . Hepatic encephalopathy ([place]) 04/12/2016  . Osteopenia 09/27/2015  . Branch retinal vein occlusion of left eye 12/12/2014  . Hypertensive retinopathy of both eyes 12/12/2014  . Lung nodule seen on imaging study 05/01/2014  . Diabetes mellitus type 2, controlled ([place]) 03/17/2013  . Morbid obesity ([place]) 03/01/2013  . 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 Retinopathy 01/18/2013  . 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 Malignant neoplasm of breast ([place]) 01/08/2011  . 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 ([place]) 10/22/2010  . Hypertension 10/22/2010  . GERD (gastroesophageal reflux disease) 10/22/2010  . Allergic rhinitis 10/22/2010  . History of bilateral mastectomy 10/22/2010  . Sarcoidosis 10/22/2010  . Osteoarthritis 10/22/2010  . Anxiety 10/22/2010   Social History   Tobacco Use  . Smoking status: Never Smoker  . Smokeless tobacco: Never Used  . Tobacco comment: never used tobacco  Substance Use Topics  . Alcohol use: No    Alcohol/week: 0.0 oz  . Drug use: No   Current Medications and Allergies:   .  amLODipine (NORVASC) 10 MG tablet, TAKE 1 TABLET (10 MG TOTAL) BY MOUTH DAILY., Disp: 90 tablet, Rfl: 1 .  aspirin 81 MG EC tablet, Take 1 tablet (81 mg total) by mouth daily., Disp: 90 tablet, Rfl: 0 .   fexofenadine ([employer]S ALLERGY RELIEF) 180 MG tablet, Take 1 tablet (180 mg total) by mouth every morning., Disp: 90 tablet, Rfl: 3 .  fluticasone (FLONASE) 50 MCG/ACT nasal spray, USE 2 SPRAYS IN BOTH NOSTRILS EVERY DAY, Disp: 16 g, Rfl: 2 .  gabapentin (NEURONTIN) 100 MG capsule, Take 1 capsule (100 mg total) by mouth at bedtime., Disp: 90 capsule, Rfl: 3 .  metoprolol succinate (TOPROL-XL) 100 MG 24 hr tablet, TAKE 1 TABLET BY MOUTH EVERY EVENING WITH OR IMMEDIATELY FOLLOWING A MEAL, Disp: 90 tablet, Rfl: 0 .  montelukast (SINGULAIR) 10 MG tablet, TAKE 1 TABLET BY MOUTH AT BEDTIME, Disp: 90 tablet, Rfl: 0 .  naproxen (NAPROSYN) 500 MG tablet, TAKE 1 TABLET (500 MG TOTAL) BY MOUTH 2 (TWO) TIMES DAILY WITH A MEAL., Disp: 180 tablet, Rfl: 2 .  octreotide (SANDOSTATIN LAR) 30 MG injection, Inject 30 mg into the muscle every 28 (twenty-eight) days., Disp: , Rfl:  .  ondansetron (ZOFRAN-ODT) 4 MG disintegrating tablet, DISSOLVE 1 TABLET BY MOUTH EVERY 8 HOURS AS NEEDED FOR NAUSEA AND VOMITING, Disp: 90 tablet, Rfl: 2 .  OSPHENA 60 MG TABS, TAKE ONE TABLET BY MOUTH DAILY, Disp: 90 tablet, Rfl: 3 .  oxyCODONE (ROXICODONE) 5 MG immediate release tablet, Take 1 tablet (5 mg total) by mouth every 4 (four) hours as needed for severe pain., Disp: 30 tablet, Rfl: 0 .  pantoprazole (PROTONIX) 40 MG tablet, TAKE 1 TABLET BY MOUTH TWICE A DAY, Disp: 180  tablet, Rfl: 1 .  PROAIR HFA 108 (90 Base) MCG/ACT inhaler, INHALE 2 PUFFS INTO THE LUNGS EVERY 6 (SIX) HOURS AS NEEDED. FOR SHORTNESS OF BREATH., Disp: 6.7 Inhaler, Rfl: 4 .  valACYclovir (VALTREX) 500 MG tablet, Take 500 mg by mouth 2 (two) times daily as needed. Cold sores, Disp: , Rfl:  .  valsartan (DIOVAN) 320 MG tablet, TAKE 1 TABLET (320 MG TOTAL) BY MOUTH DAILY., Disp: 90 tablet, Rfl: 1 .  venlafaxine XR (EFFEXOR-XR) 150 MG 24 hr capsule, TAKE 1 CAPSULE (150 MG TOTAL) BY MOUTH EACH MORNING, Disp: 90 capsule, Rfl: 3  No Known Allergies   Review of Systems    Pertinent items are noted in the HPI. Otherwise, ROS is negative.  Vitals:   Vitals:   01/28/17 1135  BP: 130/82  Pulse: 88  Temp: 98.6 F (37 C)  TempSrc: Oral  SpO2: 95%  Weight: 234 lb 12.8 oz (106.5 kg)     Body mass index is 47.42 kg/m.   Physical Exam:   Physical Exam  Constitutional: She appears well-nourished.  HENT:  Head: Normocephalic and atraumatic.  Eyes: EOM are normal. Pupils are equal, round, and reactive to light.  Neck: Normal range of motion. Neck supple.  Cardiovascular: Normal rate, regular rhythm, normal heart sounds and intact distal pulses.  Pulmonary/Chest: Effort normal.  Abdominal: Soft.  Skin: Skin is warm.  Psychiatric: She has a normal mood and affect. Her behavior is normal.  Nursing note and vitals reviewed.   Assessment and Plan:   Jailin was seen today for follow-up.  Diagnoses and all orders for this visit:  Controlled type 2 diabetes mellitus without complication, without long-term current use of insulin (HCC) Comments: Current symptoms: no polyuria or polydipsia, no chest pain, dyspnea or TIA's, no numbness, tingling or pain in extremities.  Maintaining a diabetic diet? []   YES  [x]   NO Trying to exercise on a regular basis? []   YES  [x]   NO   On ACE inhibitor or angiotensin II receptor blocker? [x]   YES  []   NO On Aspirin? []   YES  [x]   NO  Lab Results  Component Value Date   HGBA1C 7.3 01/28/2017    Lab Results  Component Value Date   MICROALBUR 9.0 (H) 05/01/2014    Lab Results  Component Value Date   CHOL 209 (H) 09/19/2015   HDL 59.30 09/19/2015   LDLCALC 122 (H) 09/19/2015   TRIG 139.0 09/19/2015   CHOLHDL 4 09/19/2015     Wt Readings from Last 3 Encounters:  01/28/17 234 lb 12.8 oz (106.5 kg)  01/20/17 235 lb 12.8 oz (107 kg)  12/19/16 235 lb (106.6 kg)   BP Readings from Last 3 Encounters:  01/28/17 130/82  01/20/17 132/82  12/19/16 (!) 151/84   Lab Results  Component Value Date   CREATININE  1.1 01/20/2017   Orders: -     POCT glycosylated hemoglobin (Hb A1C)  Morbid obesity (HCC) Comments: Contraindications to weight loss: none. Patient readiness to commit to diet and activity changes: excellent. Barriers to weight loss: medical issues.  Plan: 1. Diagnostic studies to rule out secondary causes of obesity: see below. 2. General patient education:   Average sustained weight loss in long-term studies w/lifestyle interventions alone is 10-15 lb.  Importance of long-term maintenance tx in weight loss.  Use non-food self-rewards to reinforce behavior changes.  Elicit support from others; identify saboteurs.  Practical target weight is usually around 2 BMI units below current weight.  3. Diet interventions:   Risks of dieting were reviewed, including fatigue, temporary hair loss, gallstone formation, gout, and with very low calorie diets, electrolyte abnormalities, nutrient inadequacies, and loss of lean body mass. 4. Exercise intervention:   Informal measures, e.g. taking stairs instead of elevator.  Formal exercise regimen options. 5. Other behavioral treatment: stress management. 6. Other treatment: Medication: as below. 7. Patient to keep a weight log that we will review at follow up. 8. Follow up: 3 months and as needed. Orders: -     Phentermine-Topiramate 3.75-23 MG CP24; Take 3.75-23 mg by mouth daily. -     Phentermine-Topiramate 7.5-46 MG CP24; Take 7.5-46 mg by mouth daily.  Chronic pain of left knee Comments: Patient presents with knee pain involving the left knee. Onset of the symptoms was several months ago. Inciting event: this is a longstanding problem which has been getting worse. Current symptoms include crepitus sensation, stiffness and swelling. Pain is aggravated by any weight bearing. Patient has had prior knee problems. She is followed by Orthopedics. Upcoming total knee scheduled for January 2019.  Orders: -     Ambulatory referral to Physical  Therapy   . Reviewed expectations re: course of current medical issues. . Discussed self-management of symptoms. . Outlined signs and symptoms indicating need for more acute intervention. . Patient verbalized understanding and all questions were answered. Marland Kitchen Health Maintenance issues including appropriate healthy diet, exercise, and smoking avoidance were discussed with patient. . See orders for this visit as documented in the electronic medical record. . Patient received an After Visit Summary.  Briscoe Deutscher, DO [place] [place] 02/04/2017  Future Appointments  Date Time Provider [place]  02/09/2017  1:00 PM [facility] A[facility] None  02/17/2017 10:45 AM [facility] SUB THERAPIST A [facility]  03/03/2017 10:00 AM Briscoe Deutscher, DO [facility]  03/09/2017  1:00 PM [facility] A2 [facility] None  04/06/2017  1:00 PM [facility] A2 [facility] None  05/04/2017  1:00 PM [facility] LAB [facility] None  05/04/2017  1:30 PM [place], Rudell Cobb, MD [facility] None  05/04/2017  2:00 PM [facility] A2 [facility] None  05/05/2017 10:00 AM Briscoe Deutscher, DO [facility]   Records requested if needed. Time spent with the patient: 40 minutes, of which >50% was spent in obtaining information about her symptoms, reviewing her previous labs, evaluations, and treatments, counseling her about her condition (please see the discussed topics above), and developing a plan to further investigate it; she had a number of questions which I addressed.

## 2017-02-07 ENCOUNTER — Other Ambulatory Visit: Payer: Self-pay | Admitting: Family Medicine

## 2017-02-07 ENCOUNTER — Encounter: Payer: Self-pay | Admitting: Family Medicine

## 2017-02-07 DIAGNOSIS — J301 Allergic rhinitis due to pollen: Secondary | ICD-10-CM

## 2017-02-09 ENCOUNTER — Ambulatory Visit (HOSPITAL_BASED_OUTPATIENT_CLINIC_OR_DEPARTMENT_OTHER): Payer: 59

## 2017-02-09 VITALS — BP 138/75 | HR 78 | Temp 98.4°F | Resp 20

## 2017-02-09 DIAGNOSIS — C7A00 Malignant carcinoid tumor of unspecified site: Secondary | ICD-10-CM | POA: Diagnosis not present

## 2017-02-09 DIAGNOSIS — C7B02 Secondary carcinoid tumors of liver: Secondary | ICD-10-CM

## 2017-02-09 DIAGNOSIS — C7B8 Other secondary neuroendocrine tumors: Secondary | ICD-10-CM

## 2017-02-09 MED ORDER — LANREOTIDE ACETATE 120 MG/0.5ML ~~LOC~~ SOLN
120.0000 mg | Freq: Once | SUBCUTANEOUS | Status: AC
  Administered 2017-02-09: 120 mg via SUBCUTANEOUS

## 2017-02-09 MED ORDER — FLUCONAZOLE 150 MG PO TABS: 150.0000 mg | ORAL_TABLET | Freq: Once | ORAL | 0 refills | Status: AC

## 2017-02-09 MED ORDER — LANREOTIDE ACETATE 120 MG/0.5ML ~~LOC~~ SOLN
SUBCUTANEOUS | Status: AC
  Filled 2017-02-09: qty 120

## 2017-02-09 NOTE — Telephone Encounter (Signed)
Called patient to get more information. Left message to return call to our office.

## 2017-02-17 ENCOUNTER — Encounter: Payer: Self-pay | Admitting: Physical Therapy

## 2017-02-17 ENCOUNTER — Ambulatory Visit (INDEPENDENT_AMBULATORY_CARE_PROVIDER_SITE_OTHER): Payer: 59 | Admitting: Physical Therapy

## 2017-02-17 DIAGNOSIS — M25562 Pain in left knee: Secondary | ICD-10-CM

## 2017-02-17 DIAGNOSIS — G8929 Other chronic pain: Secondary | ICD-10-CM | POA: Diagnosis not present

## 2017-02-17 NOTE — Therapy (Signed)
[facility] [address] [place], [place], [address] Phone: [phone]   Fax:  [phone]  Physical Therapy Evaluation  Patient Details  Name: Leah [name] MRN: [id number] Date of Birth: December 12, 1960 Referring Provider: Juleen China    Encounter Date: 02/17/2017  PT End of Session - 02/17/17 1118    Visit Number  1    Number of Visits  4    Date for PT Re-Evaluation  03/17/17    Authorization Type  Aetna   50 Visits    PT Start Time  1045    PT Stop Time  1130    PT Time Calculation (min)  45 min    Activity Tolerance  Patient tolerated treatment well;Patient limited by pain    Behavior During Therapy  [facility] for tasks assessed/performed       Past Medical History:  Diagnosis Date  . Allergic rhinitis   . Atrophic vaginitis   . Borderline diabetes 03/17/2013  . 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 GE reflux   . Hepatic encephalopathy ([place]) 2011  . History of retinal vein occlusion    right eye  . HTN (hypertension) 2011  . Migraines   . Neuroendocrine tumor 2011  . Orthostatic hypotension   . Palpitations   . Sarcoidosis 2008  . STD (sexually transmitted disease)    HSV I  . Surgical menopause     Past Surgical History:  Procedure Laterality Date  . ABDOMINAL HYSTERECTOMY  2004  . AUGMENTATION MAMMAPLASTY Bilateral   . bilateral mastectectomy Bilateral 2008  . BREAST BIOPSY Left   . BREAST SURGERY     bilateral masectomy and reconstruction with silicone implants  . CESAREAN SECTION  [id number]  . CHOLECYSTECTOMY  2006  . hepatetetomy    . KNEE ARTHROSCOPY Left 09/2010  . latissimus dorsi graft  2008   used for reconstructive breast surgery  . MASTECTOMY Bilateral 2008  . OOPHORECTOMY  2004  . TONSILLECTOMY  1976  . TUBAL LIGATION  2001   BTL    There were no vitals filed for this visit.   Subjective Assessment - 02/17/17 1046    Subjective  Pt states increaed pain in L knee. She states meniscus tear about 5 years ago,  which she had arthroscopy for. She reports ongoing, constant pain that has not improved. She reports no injury to start pain. She states that she is candidate for knee replacement, but does not have it scheduled yet. She is interested in pre-hab and increasing activity level prior to surgery. She has SPC that she uses at home.     Patient is accompained by:  Family member    Pertinent History  Previous Breast Cancer     Limitations  Standing;House hold activities    Patient Stated Goals  Increased activity level prior to surgery    Currently in Pain?  Yes    Pain Score  6     Pain Location  Knee    Pain Orientation  Left    Pain Descriptors / Indicators  Aching    Pain Type  Chronic pain    Pain Onset  More than a month ago    Pain Frequency  Constant    Aggravating Factors   Increased activity     Pain Relieving Factors  Sharlyne Pacas PT Assessment - 02/17/17 0001      Assessment   Medical Diagnosis  Left knee pain  Referring Provider  Juleen China     Next MD Visit  1/25 [place]      Precautions   Precautions  None      Balance Screen   Has the patient fallen in the past 6 months  No      Prior Function   Level of Independence  Independent      Cognition   Overall Cognitive Status  Within Functional Limits for tasks assessed      ROM / Strength   AROM / PROM / Strength  AROM;Strength      AROM   Overall AROM   Within functional limits for tasks performed      Strength   Overall Strength Comments  4/5 knee ;  4-/5 Hip      Flexibility   Soft Tissue Assessment /Muscle Length  yes    Hamstrings  WFL    Quadriceps  WFL      Palpation   Palpation comment  Poor tracking of patella              Objective measurements completed on examination: See above findings.      [facility] Adult PT Treatment/Exercise - 02/17/17 0001      Exercises   Exercises  Knee/Hip      Knee/Hip Exercises: Supine   Quad Sets  20 reps;Right    Heel Slides  20 reps;Right     Straight Leg Raises  10 reps;Both    Knee Extension  20 reps;Right      Knee/Hip Exercises: Sidelying   Hip ABduction  10 reps             PT Education - 02/17/17 1056    Education provided  Yes    Education Details  HEP     Person(s) Educated  Patient    Methods  Explanation;Demonstration;Handout    Comprehension  Verbalized understanding          PT Long Term Goals - 02/17/17 1345      PT LONG TERM GOAL #1   Title  Pt to demo increased strength to at least 4+/5 for Bil knees and hips , to improve pain and stability     Time  4    Period  Weeks    Status  New    Target Date  03/17/17      PT LONG TERM GOAL #2   Title  Pt to demo ability for independent HEP for long term strength and ROM     Time  4    Period  Weeks    Status  New    Target Date  03/17/17             Plan - 02/17/17 1121    Clinical Impression Statement  Pt presents with primary complaint of increased pain in left knee, consistent with OA. Pt with decreased strength, stability, decreased gait, and poor endurance and tolerance for standing, walking, and functional activity. Pt to benefit from skilled PT to improve strength, and initiate HEP prior to sugery that is likely in next few months.     Clinical Presentation  Stable    Clinical Decision Making  Low    Rehab Potential  Fair    PT Frequency  1x / week    PT Duration  4 weeks    PT Treatment/Interventions  ADLs/Self Care Home Management;Cryotherapy;Electrical Stimulation;Moist Heat;Therapeutic exercise;Therapeutic activities;Functional mobility training;Stair training;Gait training;Ultrasound;Balance training;Neuromuscular re-education;Patient/family education;Orthotic Fit/Training;Manual techniques;Taping;Dry needling;Passive range of motion  PT Next Visit Plan  Review and progress HEP, LE strength       Patient will benefit from skilled therapeutic intervention in order to improve the following deficits and impairments:  Abnormal  gait, Decreased endurance, Decreased activity tolerance, Decreased strength, Pain, Difficulty walking, Decreased balance, Decreased range of motion, Impaired flexibility  Visit Diagnosis: Chronic pain of left knee - Plan: PT plan of care cert/re-cert     Problem List Patient Active Problem List   Diagnosis Date Noted  . Atrophic vaginitis 04/12/2016  . Hepatic encephalopathy ([place]) 04/12/2016  . Osteopenia 09/27/2015  . Branch retinal vein occlusion of left eye 12/12/2014  . Hypertensive retinopathy of both eyes 12/12/2014  . Lung nodule seen on imaging study 05/01/2014  . Diabetes mellitus type 2, controlled ([place]) 03/17/2013  . Morbid obesity ([place]) 03/01/2013  . 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 Retinopathy 01/18/2013  . 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 Malignant neoplasm of breast ([place]) 01/08/2011  . 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 Hypertension 10/22/2010  . GERD (gastroesophageal reflux disease) 10/22/2010  . Allergic rhinitis 10/22/2010  . History of bilateral mastectomy 10/22/2010  . Sarcoidosis 10/22/2010  . Osteoarthritis 10/22/2010  . Anxiety 10/22/2010   Lyndee Hensen, PT, DPT 1:50 PM  02/17/17   [facility] [place], [place], [address] Phone: [phone]   Fax:  [phone]  Name: Leah [name] MRN: [id number] Date of Birth: Dec 25, 1960

## 2017-03-02 ENCOUNTER — Encounter: Payer: Self-pay | Admitting: Physical Therapy

## 2017-03-02 ENCOUNTER — Ambulatory Visit (INDEPENDENT_AMBULATORY_CARE_PROVIDER_SITE_OTHER): Payer: 59 | Admitting: Physical Therapy

## 2017-03-02 DIAGNOSIS — M25562 Pain in left knee: Secondary | ICD-10-CM | POA: Diagnosis not present

## 2017-03-02 DIAGNOSIS — G8929 Other chronic pain: Secondary | ICD-10-CM | POA: Diagnosis not present

## 2017-03-02 NOTE — Therapy (Signed)
[facility] [address] [place], [place], [address] Phone: [phone]   Fax:  [phone]  Physical Therapy Treatment  Patient Details  Name: Leah [name] MRN: [id number] Date of Birth: [birth year] Referring Provider: Juleen China    Encounter Date: 03/02/2017  PT End of Session - 03/02/17 1654    Visit Number  2    Number of Visits  4    Date for PT Re-Evaluation  03/17/17    Authorization Type  Aetna   50 Visits    PT Start Time  1602    PT Stop Time  1644    PT Time Calculation (min)  42 min    Activity Tolerance  Patient tolerated treatment well;Patient limited by pain    Behavior During Therapy  [facility] for tasks assessed/performed       Past Medical History:  Diagnosis Date  . Allergic rhinitis   . Atrophic vaginitis   . Borderline diabetes 03/17/2013  . 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 GE reflux   . Hepatic encephalopathy ([place]) 2011  . History of retinal vein occlusion    right eye  . HTN (hypertension) 2011  . Migraines   . Neuroendocrine tumor 2011  . Orthostatic hypotension   . Palpitations   . Sarcoidosis 2008  . STD (sexually transmitted disease)    HSV I  . Surgical menopause     Past Surgical History:  Procedure Laterality Date  . ABDOMINAL HYSTERECTOMY  2004  . AUGMENTATION MAMMAPLASTY Bilateral   . bilateral mastectectomy Bilateral 2008  . BREAST BIOPSY Left   . BREAST SURGERY     bilateral masectomy and reconstruction with silicone implants  . CESAREAN SECTION  [id number]  . CHOLECYSTECTOMY  2006  . hepatetetomy    . KNEE ARTHROSCOPY Left 09/2010  . latissimus dorsi graft  2008   used for reconstructive breast surgery  . MASTECTOMY Bilateral 2008  . OOPHORECTOMY  2004  . TONSILLECTOMY  1976  . TUBAL LIGATION  2001   BTL    There were no vitals filed for this visit.  Subjective Assessment - 03/02/17 1653    Subjective  [facility], and doing HEP. She has lost about 8  lb since last MD appt.     Currently in Pain?  Yes    Pain Score  5     Pain Location  Knee    Pain Orientation  Left    Pain Descriptors / Indicators  Aching    Pain Type  Chronic pain    Pain Onset  More than a month ago    Pain Frequency  Constant                      [facility] Adult PT Treatment/Exercise - 03/02/17 1613      Exercises   Exercises  Knee/Hip      Knee/Hip Exercises: Aerobic   Stationary Bike  L1 x8 min      Knee/Hip Exercises: Standing   Other Standing Knee Exercises  March/walk 10 ft x6       Knee/Hip Exercises: Seated   Long Arc Quad  20 reps    Clamshell with TheraBand  Green x20    Hamstring Curl  20 reps    Hamstring Limitations  Yellow T-band      Knee/Hip Exercises: Supine   Quad Sets  20 reps;Right    Short Arc Quad Sets  20 reps  Heel Slides  20 reps;Right    Straight Leg Raises  Both;20 reps    Knee Extension  --      Knee/Hip Exercises: Sidelying   Hip ABduction  20 reps             PT Education - 03/02/17 1654    Education provided  Yes    Education Details  HEP    Person(s) Educated  Patient    Methods  Explanation    Comprehension  Verbalized understanding          PT Long Term Goals - 02/17/17 1345      PT LONG TERM GOAL #1   Title  Pt to demo increased strength to at least 4+/5 for Bil knees and hips , to improve pain and stability     Time  4    Period  Weeks    Status  New    Target Date  03/17/17      PT LONG TERM GOAL #2   Title  Pt to demo ability for independent HEP for long term strength and ROM     Time  4    Period  Weeks    Status  New    Target Date  03/17/17            Plan - 03/02/17 1654    Clinical Impression Statement  Reviewed HEP and progressed there ex today, for strengthening of knee and hip. Pt able to perform exercises with no increased pain. Plan to progress standing activities as tolerated.    Rehab Potential  Fair    PT Frequency  1x / week    PT Duration  4  weeks    PT Treatment/Interventions  ADLs/Self Care Home Management;Cryotherapy;Electrical Stimulation;Moist Heat;Therapeutic exercise;Therapeutic activities;Functional mobility training;Stair training;Gait training;Ultrasound;Balance training;Neuromuscular re-education;Patient/family education;Orthotic Fit/Training;Manual techniques;Taping;Dry needling;Passive range of motion    PT Next Visit Plan  Review and progress HEP, LE strength       Patient will benefit from skilled therapeutic intervention in order to improve the following deficits and impairments:  Abnormal gait, Decreased endurance, Decreased activity tolerance, Decreased strength, Pain, Difficulty walking, Decreased balance, Decreased range of motion, Impaired flexibility  Visit Diagnosis: Chronic pain of left knee     Problem List Patient Active Problem List   Diagnosis Date Noted  . Atrophic vaginitis 04/12/2016  . Hepatic encephalopathy ([place]) 04/12/2016  . Osteopenia 09/27/2015  . Branch retinal vein occlusion of left eye 12/12/2014  . Hypertensive retinopathy of both eyes 12/12/2014  . Lung nodule seen on imaging study 05/01/2014  . Diabetes mellitus type 2, controlled ([place]) 03/17/2013  . Morbid obesity ([place]) 03/01/2013  . 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) 02/21/2013  . Retinopathy 01/18/2013  . 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 Malignant neoplasm of breast ([place]) 01/08/2011  . 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 Hypertension 10/22/2010  . GERD (gastroesophageal reflux disease) 10/22/2010  . Allergic rhinitis 10/22/2010  . History of bilateral mastectomy 10/22/2010  . Sarcoidosis 10/22/2010  . Osteoarthritis 10/22/2010  . Anxiety 10/22/2010   Lyndee Hensen, PT, DPT 4:56 PM  03/02/17    [facility] [place], [place], [address] Phone: [phone]   Fax:  [phone]  Name: Leah [name] MRN: [id number] Date of Birth:  1960/12/21

## 2017-03-03 ENCOUNTER — Ambulatory Visit: Payer: 59 | Admitting: Family Medicine

## 2017-03-04 ENCOUNTER — Ambulatory Visit (INDEPENDENT_AMBULATORY_CARE_PROVIDER_SITE_OTHER): Payer: 59 | Admitting: Family Medicine

## 2017-03-04 VITALS — BP 130/88 | HR 118 | Temp 98.2°F | Wt 228.0 lb

## 2017-03-04 DIAGNOSIS — M4722 Other spondylosis with radiculopathy, cervical region: Secondary | ICD-10-CM

## 2017-03-04 DIAGNOSIS — L910 Hypertrophic scar: Secondary | ICD-10-CM | POA: Diagnosis not present

## 2017-03-04 DIAGNOSIS — L989 Disorder of the skin and subcutaneous tissue, unspecified: Secondary | ICD-10-CM

## 2017-03-04 MED ORDER — PHENTERMINE-TOPIRAMATE ER 7.5-46 MG PO CP24: 7.5000 mg | ORAL_CAPSULE | Freq: Every day | ORAL | 2 refills | Status: DC

## 2017-03-04 MED ORDER — GABAPENTIN 100 MG PO CAPS: 100.0000 mg | ORAL_CAPSULE | Freq: Every day | ORAL | 3 refills | Status: DC

## 2017-03-04 NOTE — Progress Notes (Addendum)
Leah [name] is a 57 y.o. female is here for follow up.  History of Present Illness:   HPI: See Assessment and Plan section for Problem Based Charting of issues discussed today.   Health Maintenance Due  Topic Date Due  . OPHTHALMOLOGY EXAM  12/11/2016  . FOOT EXAM  01/20/2017   Depression screen [place] 2/9 12/15/2016 05/03/2013 04/19/2013  Decreased Interest 0 0 0  Down, Depressed, Hopeless 0 0 0  PHQ - 2 Score 0 0 0  Some recent data might be hidden   PMHx, SurgHx, SocialHx, FamHx, Medications, and Allergies were reviewed in the Visit Navigator and updated as appropriate.   Patient Active Problem List   Diagnosis Date Noted  . Atrophic vaginitis 04/12/2016  . Hepatic encephalopathy ([place]) 04/12/2016  . Osteopenia 09/27/2015  . Branch retinal vein occlusion of left eye 12/12/2014  . Hypertensive retinopathy of both eyes 12/12/2014  . Lung nodule seen on imaging study 05/01/2014  . Diabetes mellitus type 2, controlled ([place]) 03/17/2013  . Morbid obesity ([place]) 03/01/2013  . 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 Retinopathy 01/18/2013  . 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 Malignant neoplasm of breast ([place]) 01/08/2011  . 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 Hypertension 10/22/2010  . GERD (gastroesophageal reflux disease) 10/22/2010  . Allergic rhinitis 10/22/2010  . History of bilateral mastectomy 10/22/2010  . Sarcoidosis 10/22/2010  . Osteoarthritis 10/22/2010  . Anxiety 10/22/2010   Social History   Tobacco Use  . Smoking status: Never Smoker  . Smokeless tobacco: Never Used  . Tobacco comment: never used tobacco  Substance Use Topics  . Alcohol use: No    Alcohol/week: 0.0 oz  . Drug use: No   Current Medications and Allergies:   .  amLODipine (NORVASC) 10 MG tablet, TAKE 1 TABLET (10 MG TOTAL) BY MOUTH DAILY., Disp: 90 tablet, Rfl: 1 .  aspirin 81 MG EC tablet, Take 1 tablet (81 mg total) by mouth daily., Disp: 90 tablet, Rfl: 0 .   fexofenadine ([employer] ALLERGY RELIEF) 180 MG tablet, Take 1 tablet (180 mg total) by mouth every morning., Disp: 90 tablet, Rfl: 3 .  fluticasone (FLONASE) 50 MCG/ACT nasal spray, USE 2 SPRAYS IN BOTH NOSTRILS EVERY DAY, Disp: 16 g, Rfl: 2 .  gabapentin (NEURONTIN) 100 MG capsule, Take 1 capsule (100 mg total) by mouth at bedtime., Disp: 90 capsule, Rfl: 3 .  metoprolol succinate (TOPROL-XL) 100 MG 24 hr tablet, TAKE 1 TABLET BY MOUTH EVERY EVENING WITH OR IMMEDIATELY FOLLOWING A MEAL, Disp: 90 tablet, Rfl: 0 .  montelukast (SINGULAIR) 10 MG tablet, TAKE 1 TABLET BY MOUTH AT BEDTIME, Disp: 90 tablet, Rfl: 0 .  naproxen (NAPROSYN) 500 MG tablet, TAKE 1 TABLET (500 MG TOTAL) BY MOUTH 2 (TWO) TIMES DAILY WITH A MEAL., Disp: 180 tablet, Rfl: 2 .  octreotide (SANDOSTATIN LAR) 30 MG injection, Inject 30 mg into the muscle every 28 (twenty-eight) days., Disp: , Rfl:  .  ondansetron (ZOFRAN-ODT) 4 MG disintegrating tablet, DISSOLVE 1 TABLET BY MOUTH EVERY 8 HOURS AS NEEDED FOR NAUSEA AND VOMITING, Disp: 90 tablet, Rfl: 2 .  OSPHENA 60 MG TABS, TAKE ONE TABLET BY MOUTH DAILY, Disp: 90 tablet, Rfl: 3 .  oxyCODONE (ROXICODONE) 5 MG immediate release tablet, Take 1 tablet (5 mg total) by mouth every 4 (four) hours as needed for severe pain., Disp: 30 tablet, Rfl: 0 .  pantoprazole (PROTONIX) 40 MG tablet, TAKE 1 TABLET BY MOUTH TWICE A DAY, Disp:  180 tablet, Rfl: 1 .  Phentermine-Topiramate 3.75-23 MG CP24, Take 3.75-23 mg by mouth daily., Disp: 14 capsule, Rfl: 0 .  Phentermine-Topiramate 7.5-46 MG CP24, Take 7.5-46 mg by mouth daily. (Patient not taking: Reported on 02/17/2017), Disp: 30 capsule, Rfl: 0 .  PROAIR HFA 108 (90 Base) MCG/ACT inhaler, INHALE 2 PUFFS INTO THE LUNGS EVERY 6 (SIX) HOURS AS NEEDED. FOR SHORTNESS OF BREATH., Disp: 6.7 Inhaler, Rfl: 4 .  valACYclovir (VALTREX) 500 MG tablet, Take 500 mg by mouth 2 (two) times daily as needed. Cold sores, Disp: , Rfl:  .  valsartan (DIOVAN) 320 MG tablet, TAKE  1 TABLET (320 MG TOTAL) BY MOUTH DAILY., Disp: 90 tablet, Rfl: 1 .  venlafaxine XR (EFFEXOR-XR) 150 MG 24 hr capsule, TAKE 1 CAPSULE (150 MG TOTAL) BY MOUTH EACH MORNING, Disp: 90 capsule, Rfl: 3  No Known Allergies Review of Systems   Pertinent items are noted in the HPI. Otherwise, ROS is negative.  Vitals:  There were no vitals filed for this visit.   There is no height or weight on file to calculate BMI. Physical Exam:   Physical Exam  Constitutional: She is oriented to person, place, and time. She appears well-developed and well-nourished. No distress.  HENT:  Head: Normocephalic and atraumatic.  Right Ear: External ear normal.  Left Ear: External ear normal.  Nose: Nose normal.  Mouth/Throat: Oropharynx is clear and moist.  Eyes: Conjunctivae and EOM are normal. Pupils are equal, round, and reactive to light.  Neck: Normal range of motion. Neck supple. No thyromegaly present.  Cardiovascular: Normal rate, regular rhythm, normal heart sounds and intact distal pulses.  Pulmonary/Chest: Effort normal and breath sounds normal.  Abdominal: Soft. Bowel sounds are normal.  Musculoskeletal: Normal range of motion.  Lymphadenopathy:    She has no cervical adenopathy.  Neurological: She is alert and oriented to person, place, and time.  Skin: Skin is warm and dry. Capillary refill takes less than 2 seconds. Rash noted. Rash is nodular.     Psychiatric: She has a normal mood and affect. Her behavior is normal.  Nursing note and vitals reviewed.  Diabetic Foot Exam - Simple   Simple Foot Form Diabetic Foot exam was performed with the following findings:  Yes 03/08/2017 11:19 AM  Visual Inspection No deformities, no ulcerations, no other skin breakdown bilaterally:  Yes Sensation Testing Intact to touch and monofilament testing bilaterally:  Yes Pulse Check Posterior Tibialis and Dorsalis pulse intact bilaterally:  Yes Comments      Results for orders placed or performed in  visit on 01/28/17  POCT glycosylated hemoglobin (Hb A1C)  Result Value Ref Range   Hemoglobin A1C 7.3    Assessment and Plan:   Leah [name] was seen today for follow-up.  Diagnoses and all orders for this visit:  Skin lesion Comments: Punch biopsy Indication: suspicious lesion Location: left calf Size: 3.5 mm Verbal informed consent obtained.  Pt aware of risks not limited to but including infection, bleeding, damage to near by organs. Prep: etoh/betadine Anesthesia: 1% lidocaine with epi, good effect Punch made with 4 mm punch Minimal oozing, controlled with pressure bandage Tolerated well Routine postprocedure instructions d/w pt- keep area clean and bandaged, follow up if concerns/spreading erythema/pain.  Orders: -     Dermatology pathology  Osteoarthritis of spine with radiculopathy, cervical region Comments: Controlled on current medication regimen. Refill provided today. Orders: -     gabapentin (NEURONTIN) 100 MG capsule; Take 1 capsule (100 mg total) by mouth  at bedtime.  Morbid obesity ([place]) Comments: Responding well to Qsymia. No side-effects. Down nearly 9 pounds. Will continue for 3 months and recheck. Orders: -     Phentermine-Topiramate 7.5-46 MG CP24; Take 7.5-46 mg by mouth daily.   . Reviewed expectations re: course of current medical issues. . Discussed self-management of symptoms. . Outlined signs and symptoms indicating need for more acute intervention. . Patient verbalized understanding and all questions were answered. Marland Kitchen Health Maintenance issues including appropriate healthy diet, exercise, and smoking avoidance were discussed with patient. . See orders for this visit as documented in the electronic medical record. . Patient received an After Visit Summary.  Briscoe Deutscher, DO [facility] 03/08/2017  Future Appointments  Date Time Provider [place]  03/09/2017  1:00 PM [facility] INJ NURSE [facility] None  03/10/2017  1:00 PM [facility]  PEN SUB THERAPIST A [facility]  04/06/2017  1:00 PM [facility] A2 [facility] None  05/04/2017  1:00 PM [facility] LAB [facility] None  05/04/2017  1:30 PM [place], Rudell Cobb, MD [facility] None  05/04/2017  2:00 PM [facility] A2 [facility] None  05/05/2017 10:00 AM Briscoe Deutscher, DO [facility]

## 2017-03-08 ENCOUNTER — Encounter: Payer: Self-pay | Admitting: Family Medicine

## 2017-03-09 ENCOUNTER — Inpatient Hospital Stay: Payer: 59 | Attending: Hematology & Oncology

## 2017-03-09 VITALS — BP 149/90 | HR 74 | Temp 98.1°F | Resp 20

## 2017-03-09 DIAGNOSIS — C7B8 Other secondary neuroendocrine tumors: Secondary | ICD-10-CM

## 2017-03-09 DIAGNOSIS — C7B02 Secondary carcinoid tumors of liver: Secondary | ICD-10-CM | POA: Diagnosis present

## 2017-03-09 DIAGNOSIS — C7A00 Malignant carcinoid tumor of unspecified site: Secondary | ICD-10-CM | POA: Insufficient documentation

## 2017-03-09 MED ORDER — LANREOTIDE ACETATE 120 MG/0.5ML ~~LOC~~ SOLN
SUBCUTANEOUS | Status: AC
  Filled 2017-03-09: qty 120

## 2017-03-09 MED ORDER — LANREOTIDE ACETATE 120 MG/0.5ML ~~LOC~~ SOLN
120.0000 mg | Freq: Once | SUBCUTANEOUS | Status: AC
  Administered 2017-03-09: 120 mg via SUBCUTANEOUS

## 2017-03-10 ENCOUNTER — Ambulatory Visit (INDEPENDENT_AMBULATORY_CARE_PROVIDER_SITE_OTHER): Payer: 59 | Admitting: Physical Therapy

## 2017-03-10 DIAGNOSIS — G8929 Other chronic pain: Secondary | ICD-10-CM | POA: Diagnosis not present

## 2017-03-10 DIAGNOSIS — M25562 Pain in left knee: Secondary | ICD-10-CM

## 2017-03-10 NOTE — Therapy (Signed)
[facility] [address] [place], [place], [address] Phone: [phone]   Fax:  [phone]  Physical Therapy Treatment  Patient Details  Name: Leah [name] MRN: [id number] Date of Birth: 1960/11/08 Referring Provider: Juleen China    Encounter Date: 03/10/2017  PT End of Session - 03/10/17 1247    Visit Number  3    Number of Visits  4    Date for PT Re-Evaluation  03/17/17    Authorization Type  Aetna   50 Visits    PT Start Time  1230    PT Stop Time  1325    PT Time Calculation (min)  55 min    Activity Tolerance  Patient tolerated treatment well;Patient limited by pain    Behavior During Therapy  [facility] for tasks assessed/performed       Past Medical History:  Diagnosis Date  . Allergic rhinitis   . Atrophic vaginitis   . Borderline diabetes 03/17/2013  . 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 GE reflux   . Hepatic encephalopathy ([place]) 2011  . History of retinal vein occlusion    right eye  . HTN (hypertension) 2011  . Migraines   . Neuroendocrine tumor 2011  . Orthostatic hypotension   . Palpitations   . Sarcoidosis 2008  . STD (sexually transmitted disease)    HSV I  . Surgical menopause     Past Surgical History:  Procedure Laterality Date  . ABDOMINAL HYSTERECTOMY  2004  . AUGMENTATION MAMMAPLASTY Bilateral   . bilateral mastectectomy Bilateral 2008  . BREAST BIOPSY Left   . BREAST SURGERY     bilateral masectomy and reconstruction with silicone implants  . CESAREAN SECTION  [id number]  . CHOLECYSTECTOMY  2006  . hepatetetomy    . KNEE ARTHROSCOPY Left 09/2010  . latissimus dorsi graft  2008   used for reconstructive breast surgery  . MASTECTOMY Bilateral 2008  . OOPHORECTOMY  2004  . TONSILLECTOMY  1976  . TUBAL LIGATION  2001   BTL    There were no vitals filed for this visit.  Subjective Assessment - 03/10/17 1239    Subjective  No new complaints. Has been doing HEP. Had follow up with Ortho, will see him  again in 4 mo.     Currently in Pain?  Yes    Pain Score  5     Pain Orientation  Left    Pain Descriptors / Indicators  Aching    Pain Type  Chronic pain    Pain Onset  More than a month ago    Pain Frequency  Constant                      [facility] Adult PT Treatment/Exercise - 03/10/17 1235      Exercises   Exercises  Knee/Hip      Knee/Hip Exercises: Stretches   Active Hamstring Stretch  3 reps;30 seconds      Knee/Hip Exercises: Aerobic   Stationary Bike  L1 x8 min      Knee/Hip Exercises: Standing   Hip Abduction  20 reps;Both    Forward Step Up  10 reps;Both    Other Standing Knee Exercises  --      Knee/Hip Exercises: Seated   Long Arc Quad  20 reps    Long Arc Quad Weight  2 lbs.    Clamshell with TheraBand  Green x20    Hamstring Curl  20 reps  Hamstring Limitations  Yellow T-band      Knee/Hip Exercises: Supine   Quad Sets  20 reps;Right    Short Arc Quad Sets  20 reps;Limitations    Short Arc Quad Sets Limitations  2 lb    Heel Slides  20 reps;Right    Straight Leg Raises  Both;20 reps      Knee/Hip Exercises: Sidelying   Hip ABduction  20 reps             PT Education - 03/10/17 1247    Education provided  Yes    Education Details  HEP, activity progression    Person(s) Educated  Patient    Methods  Explanation    Comprehension  Verbalized understanding          PT Long Term Goals - 02/17/17 1345      PT LONG TERM GOAL #1   Title  Pt to demo increased strength to at least 4+/5 for Bil knees and hips , to improve pain and stability     Time  4    Period  Weeks    Status  New    Target Date  03/17/17      PT LONG TERM GOAL #2   Title  Pt to demo ability for independent HEP for long term strength and ROM     Time  4    Period  Weeks    Status  New    Target Date  03/17/17            Plan - 03/10/17 1250    Clinical Impression Statement  Pt doing well with ability to progress exercises. She reports no  increase in pain with exercises today. She is challenged with stability when standing on one leg with hip abduction. Plan to continue strength progression.     Rehab Potential  Fair    PT Frequency  1x / week    PT Duration  4 weeks    PT Treatment/Interventions  ADLs/Self Care Home Management;Cryotherapy;Electrical Stimulation;Moist Heat;Therapeutic exercise;Therapeutic activities;Functional mobility training;Stair training;Gait training;Ultrasound;Balance training;Neuromuscular re-education;Patient/family education;Orthotic Fit/Training;Manual techniques;Taping;Dry needling;Passive range of motion    PT Next Visit Plan  Review and progress HEP, LE strength       Patient will benefit from skilled therapeutic intervention in order to improve the following deficits and impairments:  Abnormal gait, Decreased endurance, Decreased activity tolerance, Decreased strength, Pain, Difficulty walking, Decreased balance, Decreased range of motion, Impaired flexibility  Visit Diagnosis: Chronic pain of left knee     Problem List Patient Active Problem List   Diagnosis Date Noted  . Atrophic vaginitis 04/12/2016  . Hepatic encephalopathy ([place]) 04/12/2016  . Osteopenia 09/27/2015  . Branch retinal vein occlusion of left eye 12/12/2014  . Hypertensive retinopathy of both eyes 12/12/2014  . Lung nodule seen on imaging study 05/01/2014  . Diabetes mellitus type 2, controlled ([place]) 03/17/2013  . Morbid obesity ([place]) 03/01/2013  . 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 Retinopathy 01/18/2013  . 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 Malignant neoplasm of breast ([place]) 01/08/2011  . 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 Hypertension 10/22/2010  . GERD (gastroesophageal reflux disease) 10/22/2010  . Allergic rhinitis 10/22/2010  . History of bilateral mastectomy 10/22/2010  . Sarcoidosis 10/22/2010  . Osteoarthritis 10/22/2010  . Anxiety 10/22/2010   Lyndee Hensen, PT,  DPT 1:33 PM  03/10/17    [facility] [address] [place], [place], [address] Phone: [phone]  Fax:  [phone]  Name: Leah [name] MRN: [id number] Date of Birth: 1960-03-08

## 2017-03-17 ENCOUNTER — Ambulatory Visit (INDEPENDENT_AMBULATORY_CARE_PROVIDER_SITE_OTHER): Payer: 59 | Admitting: Physical Therapy

## 2017-03-17 ENCOUNTER — Encounter: Payer: Self-pay | Admitting: Physical Therapy

## 2017-03-17 DIAGNOSIS — G8929 Other chronic pain: Secondary | ICD-10-CM

## 2017-03-17 DIAGNOSIS — M25562 Pain in left knee: Secondary | ICD-10-CM | POA: Diagnosis not present

## 2017-03-17 NOTE — Therapy (Signed)
[facility] [address] [place], [place], [address] Phone: [phone]   Fax:  [phone]  Physical Therapy Treatment/Re-Cert  Patient Details  Name: Leah [name] MRN: [id number] Date of Birth: 1960-04-11 Referring Provider: Juleen China    Encounter Date: 03/17/2017  PT End of Session - 03/17/17 1454    Visit Number  4    Number of Visits  5    Date for PT Re-Evaluation  03/27/17    Authorization Type  Aetna   50 Visits    PT Start Time  0100    PT Stop Time  0146    PT Time Calculation (min)  46 min    Activity Tolerance  Patient tolerated treatment well;Patient limited by pain    Behavior During Therapy  [facility] for tasks assessed/performed       Past Medical History:  Diagnosis Date  . Allergic rhinitis   . Atrophic vaginitis   . Borderline diabetes 03/17/2013  . 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 GE reflux   . Hepatic encephalopathy ([place]) 2011  . History of retinal vein occlusion    right eye  . HTN (hypertension) 2011  . Migraines   . Neuroendocrine tumor 2011  . Orthostatic hypotension   . Palpitations   . Sarcoidosis 2008  . STD (sexually transmitted disease)    HSV I  . Surgical menopause     Past Surgical History:  Procedure Laterality Date  . ABDOMINAL HYSTERECTOMY  2004  . AUGMENTATION MAMMAPLASTY Bilateral   . bilateral mastectectomy Bilateral 2008  . BREAST BIOPSY Left   . BREAST SURGERY     bilateral masectomy and reconstruction with silicone implants  . CESAREAN SECTION  [id number]  . CHOLECYSTECTOMY  2006  . hepatetetomy    . KNEE ARTHROSCOPY Left 09/2010  . latissimus dorsi graft  2008   used for reconstructive breast surgery  . MASTECTOMY Bilateral 2008  . OOPHORECTOMY  2004  . TONSILLECTOMY  1976  . TUBAL LIGATION  2001   BTL    There were no vitals filed for this visit.  Subjective Assessment - 03/17/17 1453    Subjective  Pt has been diligent with HEP and aquatic classes. She continues to have  increased pain with prolonged weight bearing.     Currently in Pain?  Yes    Pain Score  5     Pain Location  Knee    Pain Orientation  Left    Pain Descriptors / Indicators  Aching    Pain Type  Chronic pain    Pain Onset  More than a month ago    Pain Frequency  Intermittent                      [facility] Adult PT Treatment/Exercise - 03/17/17 1331      Exercises   Exercises  Knee/Hip      Knee/Hip Exercises: Stretches   Active Hamstring Stretch  --      Knee/Hip Exercises: Aerobic   Stationary Bike  L1 x8 min      Knee/Hip Exercises: Standing   Hip Abduction  20 reps;Both    Forward Step Up  10 reps;Both    Other Standing Knee Exercises  Standing March x20; Toe taps on 6 in step x20; Tandem stance 30 sec x2 Bil;       Knee/Hip Exercises: Seated   Long Arc Quad  20 reps    Long Arc Quad Weight  2  lbs.    Clamshell with TheraBand  --    Hamstring Curl  20 reps    Hamstring Limitations  Green T-band      Knee/Hip Exercises: Supine   Quad Sets  20 reps;Right    Short Arc Quad Sets  --    Heel Slides  20 reps;Right    Straight Leg Raises  Both;20 reps      Knee/Hip Exercises: Sidelying   Hip ABduction  20 reps             PT Education - 03/17/17 1454    Education provided  Yes    Education Details  HEP, plan for D/C    Person(s) Educated  Patient    Methods  Explanation    Comprehension  Verbalized understanding          PT Long Term Goals - 03/17/17 1455      PT LONG TERM GOAL #1   Title  Pt to demo increased strength to at least 4+/5 for Bil knees and hips , to improve pain and stability     Period  Weeks    Status  Achieved      PT LONG TERM GOAL #2   Title  Pt to demo ability for independent HEP for long term strength and ROM     Status  On-going            Plan - 03/17/17 1457    Clinical Impression Statement  Pt progressing well with ability for exercises. She has full ROM, and improving strength. She Requires min-mod  cuing with standing exercises today for form. Pt will benefit from continued strengthening with HEP, prior to follow up with surgeon. She is doing well with aquatic  exercise program as well. Plan to d/c next visit. Will review final HEP next visit as well.     Rehab Potential  Fair    PT Frequency  1x / week    PT Duration  2 weeks    PT Treatment/Interventions  ADLs/Self Care Home Management;Cryotherapy;Electrical Stimulation;Moist Heat;Therapeutic exercise;Therapeutic activities;Functional mobility training;Stair training;Gait training;Ultrasound;Balance training;Neuromuscular re-education;Patient/family education;Orthotic Fit/Training;Manual techniques;Taping;Dry needling;Passive range of motion    PT Next Visit Plan  Review final HEP        Patient will benefit from skilled therapeutic intervention in order to improve the following deficits and impairments:  Abnormal gait, Decreased endurance, Decreased activity tolerance, Decreased strength, Pain, Difficulty walking, Decreased balance, Decreased range of motion, Impaired flexibility  Visit Diagnosis: Chronic pain of left knee     Problem List Patient Active Problem List   Diagnosis Date Noted  . Atrophic vaginitis 04/12/2016  . Hepatic encephalopathy ([place]) 04/12/2016  . Osteopenia 09/27/2015  . Branch retinal vein occlusion of left eye 12/12/2014  . Hypertensive retinopathy of both eyes 12/12/2014  . Lung nodule seen on imaging study 05/01/2014  . Diabetes mellitus type 2, controlled ([place]) 03/17/2013  . Morbid obesity ([place]) 03/01/2013  . 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 Retinopathy 01/18/2013  . 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 Malignant neoplasm of breast ([place]) 01/08/2011  . 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 Hypertension 10/22/2010  . GERD (gastroesophageal reflux disease) 10/22/2010  . Allergic rhinitis 10/22/2010  . History of bilateral mastectomy 10/22/2010  . Sarcoidosis 10/22/2010   . Osteoarthritis 10/22/2010  . Anxiety 10/22/2010   Lyndee Hensen, PT, DPT 3:06 PM  03/17/17    [facility] [place], [place], [address] Phone:  [phone]   Fax:  [phone]  Name: Leah [name] MRN: [id number] Date of Birth: 06/20/1960

## 2017-03-24 ENCOUNTER — Ambulatory Visit (INDEPENDENT_AMBULATORY_CARE_PROVIDER_SITE_OTHER): Payer: 59 | Admitting: Physical Therapy

## 2017-03-24 ENCOUNTER — Encounter: Payer: Self-pay | Admitting: Physical Therapy

## 2017-03-24 ENCOUNTER — Encounter: Payer: Self-pay | Admitting: Family Medicine

## 2017-03-24 DIAGNOSIS — M25562 Pain in left knee: Secondary | ICD-10-CM

## 2017-03-24 DIAGNOSIS — G8929 Other chronic pain: Secondary | ICD-10-CM | POA: Diagnosis not present

## 2017-03-24 NOTE — Therapy (Signed)
[facility] [address] [place], [place], [address] Phone: [phone]   Fax:  [phone]  Physical Therapy Treatment/Discharge  Patient Details  Name: Leah [name] MRN: [id number] Date of Birth: [birth year] Referring Provider: Juleen China    Encounter Date: 03/24/2017  PT End of Session - 03/24/17 1357    Visit Number  5    Number of Visits  5    Date for PT Re-Evaluation  03/27/17    Authorization Type  Aetna   50 Visits    PT Start Time  1307    PT Stop Time  1353    PT Time Calculation (min)  46 min    Activity Tolerance  Patient tolerated treatment well;Patient limited by pain    Behavior During Therapy  [facility] for tasks assessed/performed       Past Medical History:  Diagnosis Date  . Allergic rhinitis   . Atrophic vaginitis   . Borderline diabetes 03/17/2013  . 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 GE reflux   . Hepatic encephalopathy ([place]) 2011  . History of retinal vein occlusion    right eye  . HTN (hypertension) 2011  . Migraines   . Neuroendocrine tumor 2011  . Orthostatic hypotension   . Palpitations   . Sarcoidosis 2008  . STD (sexually transmitted disease)    HSV I  . Surgical menopause     Past Surgical History:  Procedure Laterality Date  . ABDOMINAL HYSTERECTOMY  2004  . AUGMENTATION MAMMAPLASTY Bilateral   . bilateral mastectectomy Bilateral 2008  . BREAST BIOPSY Left   . BREAST SURGERY     bilateral masectomy and reconstruction with silicone implants  . CESAREAN SECTION  [id number]  . CHOLECYSTECTOMY  2006  . hepatetetomy    . KNEE ARTHROSCOPY Left 09/2010  . latissimus dorsi graft  2008   used for reconstructive breast surgery  . MASTECTOMY Bilateral 2008  . OOPHORECTOMY  2004  . TONSILLECTOMY  1976  . TUBAL LIGATION  2001   BTL    There were no vitals filed for this visit.  Subjective Assessment - 03/24/17 1311    Subjective  Pt has been diligent with HEP and aquatic classes. She continues to  have increased pain with prolonged weight bearing.     Limitations  Sitting;Standing    Currently in Pain?  Yes    Pain Score  5     Pain Location  Knee    Pain Orientation  Left    Pain Descriptors / Indicators  Aching    Pain Type  Chronic pain    Pain Onset  More than a month ago    Pain Frequency  Intermittent         [facility] PT Assessment - 03/24/17 0001      AROM   Overall AROM   Within functional limits for tasks performed      Strength   Overall Strength Comments  Hip: 4+/5;  Knee: 5/5                  [facility] Adult PT Treatment/Exercise - 03/24/17 1327      Exercises   Exercises  Knee/Hip      Knee/Hip Exercises: Aerobic   Stationary Bike  L1 x8 min      Knee/Hip Exercises: Standing   Hip Abduction  20 reps;Both    Hip Extension  20 reps;Both    Forward Step Up  10 reps;Both    Other Standing Knee  Exercises  Standing March x20; Toe taps on 6 in step x20; Tandem stance 30 sec x2 Bil;       Knee/Hip Exercises: Seated   Long Arc Quad  20 reps    Long Arc Quad Weight  2 lbs.    Clamshell with TheraBand  Green x20    Hamstring Curl  20 reps    Hamstring Limitations  Green T-band      Knee/Hip Exercises: Supine   Quad Sets  20 reps;Right    Heel Slides  20 reps;Right    Straight Leg Raises  Both;20 reps      Knee/Hip Exercises: Sidelying   Hip ABduction  20 reps             PT Education - 03/24/17 1310    Education provided  Yes    Education Details  Final HEP     Person(s) Educated  Patient    Methods  Explanation    Comprehension  Verbalized understanding          PT Long Term Goals - 03/24/17 1357      PT LONG TERM GOAL #1   Title  Pt to demo increased strength to at least 4+/5 for Bil knees and hips , to improve pain and stability     Period  Weeks    Status  Achieved      PT LONG TERM GOAL #2   Title  Pt to demo ability for independent HEP for long term strength and ROM     Status  Achieved            Plan -  03/24/17 1359    Clinical Impression Statement  Pt has met goals for PT. She has full ROM and improved strength. She will continue strengthening for hip and knee with HEP. Pt with good understanding of final HEP which was reviewed today. Pt will continue current exercise plan with strengthening and aquatic therapy for knee pain and weightloss will follow up with surgeon in a couple months. D/C at this time.     Rehab Potential  Fair    PT Frequency  1x / week    PT Duration  2 weeks    PT Treatment/Interventions  ADLs/Self Care Home Management;Cryotherapy;Electrical Stimulation;Moist Heat;Therapeutic exercise;Therapeutic activities;Functional mobility training;Stair training;Gait training;Ultrasound;Balance training;Neuromuscular re-education;Patient/family education;Orthotic Fit/Training;Manual techniques;Taping;Dry needling;Passive range of motion    PT Next Visit Plan  Review final HEP        Patient will benefit from skilled therapeutic intervention in order to improve the following deficits and impairments:  Abnormal gait, Decreased endurance, Decreased activity tolerance, Decreased strength, Pain, Difficulty walking, Decreased balance, Decreased range of motion, Impaired flexibility  Visit Diagnosis: Chronic pain of left knee     Problem List Patient Active Problem List   Diagnosis Date Noted  . Atrophic vaginitis 04/12/2016  . Hepatic encephalopathy ([place]) 04/12/2016  . Osteopenia 09/27/2015  . Branch retinal vein occlusion of left eye 12/12/2014  . Hypertensive retinopathy of both eyes 12/12/2014  . Lung nodule seen on imaging study 05/01/2014  . Diabetes mellitus type 2, controlled ([place]) 03/17/2013  . Morbid obesity ([place]) 03/01/2013  . 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 Retinopathy 01/18/2013  . 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 Malignant neoplasm of breast ([place]) 01/08/2011  . 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 Hypertension 10/22/2010  .  GERD (gastroesophageal reflux disease) 10/22/2010  . Allergic rhinitis 10/22/2010  . History of bilateral mastectomy 10/22/2010  . Sarcoidosis  10/22/2010  . Osteoarthritis 10/22/2010  . Anxiety 10/22/2010   Lyndee Hensen, PT, DPT 2:02 PM  03/24/17    [facility] [place], [place], [address] Phone: [phone]   Fax:  [phone]  Name: Leah [name] MRN: [id number] Date of Birth: April 27, 1960   PHYSICAL THERAPY DISCHARGE SUMMARY  Visits from Start of Care: 5   Plan: Patient agrees to discharge.  Patient goals were met. Patient is being discharged due to meeting the stated rehab goals.  ?????     Lyndee Hensen, PT, DPT 2:02 PM  03/24/17

## 2017-03-30 NOTE — Telephone Encounter (Signed)
Message sent to pt.

## 2017-04-03 ENCOUNTER — Ambulatory Visit (INDEPENDENT_AMBULATORY_CARE_PROVIDER_SITE_OTHER): Payer: 59 | Admitting: Physician Assistant

## 2017-04-03 ENCOUNTER — Encounter: Payer: Self-pay | Admitting: Physician Assistant

## 2017-04-03 DIAGNOSIS — E119 Type 2 diabetes mellitus without complications: Secondary | ICD-10-CM | POA: Diagnosis not present

## 2017-04-03 DIAGNOSIS — Z713 Dietary counseling and surveillance: Secondary | ICD-10-CM

## 2017-04-03 NOTE — Patient Instructions (Signed)
It was great to meet you!  Keep [place] posted on your weight loss journey, we are always here for you!

## 2017-04-03 NOTE — Progress Notes (Signed)
Leah [name] is a 57 y.o. female here for a new problem.  I acted as a Education administrator for [employer], PA-C [employer], LPN.  History of Present Illness:   Chief Complaint  Patient presents with  . Nutrition Counseling    Pt here today to discuss diet and nutrition. Pt is on phentermine-topiramate 7.5 mg x 2 months and has only lost 10 pounds has hit a plateau. Pt would like to lose 20-25 pounds by August so she can have knee surgery. Weight past five years highest was 235 pounds and lowest 190 pounds.   She is present with her husband today.  Dietary recall: Wakes up between 8:00 and 9:00 everyday. Breakfast -- 9:30am FiberOne bar (~200 calorie), water, 1/2 coffee with cream and sugar Mid-morning snack -- 3 x 8 oz water Lunch -- 12:30p salad w/ minimal dressing with chicken OR cottage cheese and pineapple Dinner -- 7p chicken/fish (size of deck of cards) and salad w/ minimal dressing Snacks -- no afternoon snacks or after dinner snacks Beverages -- only water and coffee  Fast food -- will have two slices of pizza (rarely)  Very few carbs in her diet.  Weight: Wt Readings from Last 10 Encounters:  04/03/17 225 lb (102.1 kg)  03/09/17 228 lb (103.4 kg)  01/28/17 234 lb 12.8 oz (106.5 kg)  01/20/17 235 lb 12.8 oz (107 kg)  12/19/16 235 lb (106.6 kg)  12/15/16 235 lb 12.8 oz (107 kg)  11/19/16 235 lb (106.6 kg)  11/02/16 233 lb (105.7 kg)  10/16/16 233 lb 8 oz (105.9 kg)  09/17/16 232 lb (105.2 kg)    Exercise: 6 days a week x 3-4 hours daily --> does all water aerobics MWF -- walking in the pool and water aerobics class; fibromyalgia class; water aerobics Tue/Thurs -- salsa dancing in the water; water yoga  Support system: husband  Sleep: Uses CPAP, compliant  Goals: 1- Lose 20-25 lb prior to surgery  Estimated daily energy needs: Calories: 1500-1700 kcal Protein: 70-80 g Fluid: at least 2000 ml  Past Medical History:  Diagnosis Date  . Allergic  rhinitis   . Atrophic vaginitis   . Borderline diabetes 03/17/2013  . 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 GE reflux   . Hepatic encephalopathy ([place]) 2011  . History of retinal vein occlusion    right eye  . HTN (hypertension) 2011  . Migraines   . Neuroendocrine tumor 2011  . Orthostatic hypotension   . Palpitations   . Sarcoidosis 2008  . STD (sexually transmitted disease)    HSV I  . Surgical menopause      Social History   Socioeconomic History  . Marital status: Married    Spouse name: Not on file  . Number of children: Not on file  . Years of education: Not on file  . Highest education level: Not on file  Social Needs  . Financial resource strain: Not on file  . Food insecurity - worry: Not on file  . Food insecurity - inability: Not on file  . Transportation needs - medical: Not on file  . Transportation needs - non-medical: Not on file  Occupational History  . Not on file  Tobacco Use  . Smoking status: Never Smoker  . Smokeless tobacco: Never Used  . Tobacco comment: never used tobacco  Substance and Sexual Activity  . Alcohol use: No    Alcohol/week: 0.0 oz  . Drug use: No  . Sexual activity: Yes  Partners: Male    Birth control/protection: Surgical    Comment: hysterectomy  Other Topics Concern  . Not on file  Social History Narrative   Married to [place]   2 sons- 2001 [name] (1999)   Has dog   She is unemployed, has worked in the past (office work)   Completed some college   Enjoys Theatre manager    Past Surgical History:  Procedure Laterality Date  . ABDOMINAL HYSTERECTOMY  2004  . AUGMENTATION MAMMAPLASTY Bilateral   . bilateral mastectectomy Bilateral 2008  . BREAST BIOPSY Left   . BREAST SURGERY     bilateral masectomy and reconstruction with silicone implants  . CESAREAN SECTION  [id number]  . CHOLECYSTECTOMY  2006  . hepatetetomy    . KNEE ARTHROSCOPY Left 09/2010  . latissimus dorsi graft  2008   used for reconstructive  breast surgery  . MASTECTOMY Bilateral 2008  . OOPHORECTOMY  2004  . TONSILLECTOMY  1976  . TUBAL LIGATION  2001   BTL    Family History  Problem Relation Age of Onset  . Arthritis Maternal Grandmother   . Arthritis Maternal Grandfather   . Arthritis Paternal Grandmother   . Arthritis Paternal Grandfather   . Heart failure Paternal Grandfather   . Breast cancer Mother 44       died at 81  . Lupus Mother   . Heart failure Father   . Hypertension Father   . Hyperlipidemia Father   . Breast cancer Maternal Aunt 38       deceased at 45 recurrence  . Ovarian cancer Maternal Aunt 43        died with in 28 months  . Breast cancer Maternal Aunt 40       died at age 43    No Known Allergies  Current Medications:   Current Outpatient Medications:  .  amLODipine (NORVASC) 10 MG tablet, TAKE 1 TABLET (10 MG TOTAL) BY MOUTH DAILY., Disp: 90 tablet, Rfl: 1 .  aspirin 81 MG EC tablet, Take 1 tablet (81 mg total) by mouth daily., Disp: 90 tablet, Rfl: 0 .  fexofenadine ([employer] ALLERGY RELIEF) 180 MG tablet, Take 1 tablet (180 mg total) by mouth every morning., Disp: 90 tablet, Rfl: 3 .  fluticasone (FLONASE) 50 MCG/ACT nasal spray, USE 2 SPRAYS IN BOTH NOSTRILS EVERY DAY, Disp: 16 g, Rfl: 2 .  gabapentin (NEURONTIN) 100 MG capsule, Take 1 capsule (100 mg total) by mouth at bedtime., Disp: 90 capsule, Rfl: 3 .  metoprolol succinate (TOPROL-XL) 100 MG 24 hr tablet, TAKE 1 TABLET BY MOUTH EVERY EVENING WITH OR IMMEDIATELY FOLLOWING A MEAL, Disp: 90 tablet, Rfl: 0 .  montelukast (SINGULAIR) 10 MG tablet, TAKE 1 TABLET BY MOUTH AT BEDTIME, Disp: 90 tablet, Rfl: 0 .  naproxen (NAPROSYN) 500 MG tablet, TAKE 1 TABLET (500 MG TOTAL) BY MOUTH 2 (TWO) TIMES DAILY WITH A MEAL., Disp: 180 tablet, Rfl: 2 .  octreotide (SANDOSTATIN LAR) 30 MG injection, Inject 30 mg into the muscle every 28 (twenty-eight) days., Disp: , Rfl:  .  ondansetron (ZOFRAN-ODT) 4 MG disintegrating tablet, DISSOLVE 1 TABLET BY MOUTH  EVERY 8 HOURS AS NEEDED FOR NAUSEA AND VOMITING, Disp: 90 tablet, Rfl: 2 .  OSPHENA 60 MG TABS, TAKE ONE TABLET BY MOUTH DAILY, Disp: 90 tablet, Rfl: 3 .  oxyCODONE (ROXICODONE) 5 MG immediate release tablet, Take 1 tablet (5 mg total) by mouth every 4 (four) hours as needed for severe pain.,  Disp: 30 tablet, Rfl: 0 .  pantoprazole (PROTONIX) 40 MG tablet, TAKE 1 TABLET BY MOUTH TWICE A DAY, Disp: 180 tablet, Rfl: 1 .  Phentermine-Topiramate 7.5-46 MG CP24, Take 7.5-46 mg by mouth daily., Disp: 30 capsule, Rfl: 2 .  PROAIR HFA 108 (90 Base) MCG/ACT inhaler, INHALE 2 PUFFS INTO THE LUNGS EVERY 6 (SIX) HOURS AS NEEDED. FOR SHORTNESS OF BREATH., Disp: 6.7 Inhaler, Rfl: 4 .  valACYclovir (VALTREX) 500 MG tablet, Take 500 mg by mouth 2 (two) times daily as needed. Cold sores, Disp: , Rfl:  .  valsartan (DIOVAN) 320 MG tablet, TAKE 1 TABLET (320 MG TOTAL) BY MOUTH DAILY., Disp: 90 tablet, Rfl: 1 .  venlafaxine XR (EFFEXOR-XR) 150 MG 24 hr capsule, TAKE 1 CAPSULE (150 MG TOTAL) BY MOUTH EACH MORNING, Disp: 90 capsule, Rfl: 3   Review of Systems:   ROS Negative unless otherwise specified per HPI.  Vitals:   Vitals:   04/03/17 1500  BP: 133/74  Pulse: 84  Temp: 98.3 F (36.8 C)  TempSrc: Oral  SpO2: 96%  Weight: 225 lb (102.1 kg)  Height: 4\' 11"  (1.499 m)     Body mass index is 45.44 kg/m.  Physical Exam:   Physical Exam  Constitutional: She is oriented to person, place, and time. She appears well-developed and well-nourished.  HENT:  Head: Normocephalic and atraumatic.  Eyes: Conjunctivae and EOM are normal.  Neck: Normal range of motion. Neck supple.  Pulmonary/Chest: Effort normal.  Musculoskeletal: Normal range of motion.  Neurological: She is alert and oriented to person, place, and time.  Skin: Skin is warm and dry.  Psychiatric: She has a normal mood and affect. Her behavior is normal. Judgment and thought content normal.    Assessment and Plan:    Saaya was seen today  for nutrition counseling.  Diagnoses and all orders for this visit:  Morbid obesity ([place])  Controlled type 2 diabetes mellitus without complication, without long-term current use of insulin ([place])  Encounter for nutritional counseling   Discussed nutrition needs, reviewed current dietary habits and provided multiple handouts including: Balanced Snacks/Breakfast/Lunch sheets, sample meal plans,and balanced plate. Discussed need for more consistent intake throughout the day, adding more variety to breakfasts, and pushing fluids. Follow-up prn. Congratulated patient on weight loss she has had thus far. Patient appreciative of information.  I spent 25 minutes with this patient, greater than 50% was face-to-face time counseling regarding the above diagnoses.   . Reviewed expectations re: course of current medical issues. . Discussed self-management of symptoms. . Outlined signs and symptoms indicating need for more acute intervention. . Patient verbalized understanding and all questions were answered. . See orders for this visit as documented in the electronic medical record. . Patient received an After-Visit Summary.  CMA or LPN served as scribe during this visit. History, Physical, and Plan performed by medical provider. Documentation and orders reviewed and attested to.  Inda Coke, PA-C

## 2017-04-04 ENCOUNTER — Other Ambulatory Visit: Payer: Self-pay | Admitting: Hematology & Oncology

## 2017-04-04 ENCOUNTER — Other Ambulatory Visit: Payer: Self-pay | Admitting: Family Medicine

## 2017-04-04 DIAGNOSIS — F419 Anxiety disorder, unspecified: Secondary | ICD-10-CM

## 2017-04-04 DIAGNOSIS — I10 Essential (primary) hypertension: Secondary | ICD-10-CM

## 2017-04-05 ENCOUNTER — Other Ambulatory Visit: Payer: Self-pay | Admitting: Family Medicine

## 2017-04-06 ENCOUNTER — Inpatient Hospital Stay: Payer: 59 | Attending: Hematology & Oncology

## 2017-04-06 ENCOUNTER — Other Ambulatory Visit: Payer: Self-pay

## 2017-04-06 VITALS — BP 151/85 | HR 84 | Temp 98.5°F

## 2017-04-06 DIAGNOSIS — C7B02 Secondary carcinoid tumors of liver: Secondary | ICD-10-CM | POA: Insufficient documentation

## 2017-04-06 DIAGNOSIS — C7A00 Malignant carcinoid tumor of unspecified site: Secondary | ICD-10-CM | POA: Insufficient documentation

## 2017-04-06 DIAGNOSIS — C7B8 Other secondary neuroendocrine tumors: Secondary | ICD-10-CM

## 2017-04-06 MED ORDER — LANREOTIDE ACETATE 120 MG/0.5ML ~~LOC~~ SOLN
SUBCUTANEOUS | Status: AC
  Filled 2017-04-06: qty 120

## 2017-04-06 MED ORDER — LANREOTIDE ACETATE 120 MG/0.5ML ~~LOC~~ SOLN
120.0000 mg | Freq: Once | SUBCUTANEOUS | Status: AC
  Administered 2017-04-06: 120 mg via SUBCUTANEOUS

## 2017-04-06 NOTE — Patient Instructions (Signed)
Lanreotide injection What is this medicine? LANREOTIDE (lan REE oh tide) is used to reduce blood levels of growth hormone in patients with a condition called acromegaly. It also works to slow or stop tumor growth in patients with neuroendocrine tumors and treat carcinoid syndrome. This medicine may be used for other purposes; ask your health care provider or pharmacist if you have questions. COMMON BRAND NAME(S): Somatuline [place] What should I tell my health care provider before I take this medicine? They need to know if you have any of these conditions: -diabetes -gallbladder disease -heart disease -kidney disease -liver disease -thyroid disease -an unusual or allergic reaction to lanreotide, other medicines, foods, dyes, or preservatives -pregnant or trying to get pregnant -breast-feeding How should I use this medicine? This medicine is for injection under the skin. It is given by a health care professional in a hospital or clinic setting. Contact your pediatrician or health care professional regarding the use of this medicine in children. Special care may be needed. Overdosage: If you think you have taken too much of this medicine contact a poison control center or emergency room at once. NOTE: This medicine is only for you. Do not share this medicine with others. What if I miss a dose? It is important not to miss your dose. Call your doctor or health care professional if you are unable to keep an appointment. What may interact with this medicine? This medicine may interact with the following medications: -bromocriptine -cyclosporine -certain medicines for blood pressure, heart disease, irregular heart beat -certain medicines for diabetes -quinidine -terfenadine This list may not describe all possible interactions. Give your health care provider a list of all the medicines, herbs, non-prescription drugs, or dietary supplements you use. Also tell them if you smoke, drink alcohol, or  use illegal drugs. Some items may interact with your medicine. What should I watch for while using this medicine? Tell your doctor or healthcare professional if your symptoms do not start to get better or if they get worse. Visit your doctor or health care professional for regular checks on your progress. Your condition will be monitored carefully while you are receiving this medicine. You may need blood work done while you are taking this medicine. Women should inform their doctor if they wish to become pregnant or think they might be pregnant. There is a potential for serious side effects to an unborn child. Talk to your health care professional or pharmacist for more information. Do not breast-feed an infant while taking this medicine or for 6 months after stopping it. This medicine has caused ovarian failure in some women. This medicine may interfere with the ability to have a child. Talk with your doctor or health care professional if you are concerned about your fertility. What side effects may I notice from receiving this medicine? Side effects that you should report to your doctor or health care professional as soon as possible: -allergic reactions like skin rash, itching or hives, swelling of the face, lips, or tongue -increased blood pressure -severe stomach pain -signs and symptoms of high blood sugar such as dizziness; dry mouth; dry skin; fruity breath; nausea; stomach pain; increased hunger or thirst; increased urination -signs and symptoms of low blood sugar such as feeling anxious; confusion; dizziness; increased hunger; unusually weak or tired; sweating; shakiness; cold; irritable; headache; blurred vision; fast heartbeat; loss of consciousness -unusually slow heartbeat Side effects that usually do not require medical attention (report to your doctor or health care professional if they continue   or are bothersome): -constipation -diarrhea -dizziness -headache -muscle pain -muscle  spasms -nausea -pain, redness, or irritation at site where injected This list may not describe all possible side effects. Call your doctor for medical advice about side effects. You may report side effects to FDA at 1-800-FDA-1088. Where should I keep my medicine? This drug is given in a hospital or clinic and will not be stored at home. NOTE: This sheet is a summary. It may not cover all possible information. If you have questions about this medicine, talk to your doctor, pharmacist, or health care provider.  2018 Elsevier/Gold Standard (2015-11-02 10:33:47)  

## 2017-04-10 ENCOUNTER — Ambulatory Visit: Payer: 59 | Admitting: Physician Assistant

## 2017-04-12 ENCOUNTER — Encounter: Payer: Self-pay | Admitting: Family Medicine

## 2017-04-13 NOTE — Telephone Encounter (Signed)
Patient checking status, please advise. Call back [phone]

## 2017-04-13 NOTE — Telephone Encounter (Signed)
See note

## 2017-04-16 ENCOUNTER — Other Ambulatory Visit: Payer: Self-pay

## 2017-04-16 MED ORDER — PHENTERMINE-TOPIRAMATE ER 7.5-46 MG PO CP24: 7.5000 mg | ORAL_CAPSULE | Freq: Every day | ORAL | 0 refills | Status: DC

## 2017-04-17 ENCOUNTER — Telehealth: Payer: Self-pay | Admitting: Hematology & Oncology

## 2017-04-20 ENCOUNTER — Other Ambulatory Visit: Payer: Self-pay | Admitting: Family Medicine

## 2017-04-21 ENCOUNTER — Encounter: Payer: Self-pay | Admitting: Family Medicine

## 2017-04-21 ENCOUNTER — Ambulatory Visit (INDEPENDENT_AMBULATORY_CARE_PROVIDER_SITE_OTHER): Payer: 59 | Admitting: Family Medicine

## 2017-04-21 VITALS — BP 130/72 | HR 73 | Temp 98.4°F | Ht 59.0 in | Wt 223.4 lb

## 2017-04-21 DIAGNOSIS — B353 Tinea pedis: Secondary | ICD-10-CM

## 2017-04-21 MED ORDER — KETOCONAZOLE 2 % EX CREA: 1.0000 "application " | TOPICAL_CREAM | Freq: Two times a day (BID) | CUTANEOUS | 0 refills | Status: DC

## 2017-04-21 NOTE — Progress Notes (Signed)
    Subjective:  Leah [name] is a 57 y.o. female who presents today for same-day appointment with a chief complaint of rash.   HPI:  Rash, Acute Issue Symptoms started a few weeks ago.  Worsened over the past few days.  Rash is located on her feet bilaterally however is worse on the right.  She thought this was consistent with athlete's foot and has tried over-the-counter treatments including Lotrimin spray which have not significantly seem to help.  Rash is occasionally painful and pruritic.  She recently started swimming at the [facility] and thinks that she may have picked up athlete's foot from there.  She has not tried any other treatments. No other obvious alleviating or aggravating factors.   ROS: Per HPI  PMH: She reports that  has never smoked. she has never used smokeless tobacco. She reports that she does not drink alcohol or use drugs.  Objective:  Physical Exam: BP 130/72 (BP Location: Left Arm, Patient Position: Sitting, Cuff Size: Normal)   Pulse 73   Temp 98.4 F (36.9 C) (Oral)   Ht 4\' 11"  (1.499 m)   Wt 223 lb 6.4 oz (101.3 kg)   SpO2 97%   BMI 45.12 kg/m   Gen: NAD, resting comfortably Skin: Erythematous, well-demarcated rash to lateral aspect of right foot.  Maceration noted in interdigital areas and feet bilaterally.  Assessment/Plan:  Tinea pedis Start topical ketoconazole.  Return precautions reviewed.  Will need to start systemic antifungals if she fails topical therapy.  Algis Greenhouse. Jerline Pain, MD 04/21/2017 1:49 PM

## 2017-05-04 ENCOUNTER — Other Ambulatory Visit: Payer: Self-pay

## 2017-05-04 ENCOUNTER — Other Ambulatory Visit: Payer: 59

## 2017-05-04 ENCOUNTER — Ambulatory Visit: Payer: 59 | Admitting: Hematology & Oncology

## 2017-05-04 ENCOUNTER — Inpatient Hospital Stay: Payer: 59 | Attending: Hematology & Oncology

## 2017-05-04 VITALS — BP 134/70 | HR 83 | Temp 98.1°F | Resp 18

## 2017-05-04 DIAGNOSIS — C7A00 Malignant carcinoid tumor of unspecified site: Secondary | ICD-10-CM | POA: Insufficient documentation

## 2017-05-04 DIAGNOSIS — C7B02 Secondary carcinoid tumors of liver: Secondary | ICD-10-CM | POA: Insufficient documentation

## 2017-05-04 DIAGNOSIS — C7B8 Other secondary neuroendocrine tumors: Secondary | ICD-10-CM

## 2017-05-04 DIAGNOSIS — J301 Allergic rhinitis due to pollen: Secondary | ICD-10-CM

## 2017-05-04 MED ORDER — MONTELUKAST SODIUM 10 MG PO TABS: 10.0000 mg | ORAL_TABLET | Freq: Every day | ORAL | 0 refills | Status: DC

## 2017-05-04 MED ORDER — LANREOTIDE ACETATE 120 MG/0.5ML ~~LOC~~ SOLN
SUBCUTANEOUS | Status: AC
  Filled 2017-05-04: qty 120

## 2017-05-04 MED ORDER — LANREOTIDE ACETATE 120 MG/0.5ML ~~LOC~~ SOLN
120.0000 mg | Freq: Once | SUBCUTANEOUS | Status: AC
  Administered 2017-05-04: 120 mg via SUBCUTANEOUS

## 2017-05-04 NOTE — Patient Instructions (Signed)
Lanreotide injection What is this-an unusual or allergic reaction to lanreotide, other medicines, foods, dyes, or preservatives -pregnant or trying to get pregnant -breast-feeding How should I use this medicine? This medicine is for injection under the skin. It is given by a health care professional in a hospital or clinic setting. Contact your pediatrician or health care professional regarding the use of this medicine in children. Special care may be needed. Overdosage: If you think you have taken too much of this medicine contact a poison control center or emergency room at once. NOTE: This medicine is only for you. Do not share this medicine with others. What if I miss a dose? It is important not to miss your dose. Call your doctor or health care professional if you are unable to keep an appointment. What may interact with this medicine? This medicine may interact with the following medications: -bromocriptine -cyclosporine -certain medicines for blood pressure, heart disease, irregular heart beat -certain medicines for diabetes -quinidine -terfenadine This list may not describe all possible interactions. Give your health care provider a list of all the medicines, herbs, non-prescription drugs, or dietary supplements you use. Also tell them if you smoke, drink alcohol, or use illegal drugs. Some items may interact with your medicine. What should I watch for while using this medicine? Tell your doctor or healthcare professional if your symptoms do not start to get better or if they get worse. Visit your doctor or health care professional for regular checks on your progress. Your condition will be monitored carefully while you are receiving this medicine. You may need blood work done while you are taking this medicine. Women should inform their doctor if they wish to become pregnant or think they might be pregnant. There is a potential for serious side effects to an unborn child. Talk to  your health care professional or pharmacist for more information. Do not breast-feed an infant while taking this medicine or for 6 months after stopping it. This medicine has caused ovarian failure in some women. This medicine may interfere with the ability to have a child. Talk with your doctor or health care professional if you are concerned about your fertility. What side effects may I notice from receiving this medicine? Side effects that you should report to your doctor or health care professional as soon as possible: -allergic reactions like skin rash, itching or hives, swelling of the face, lips, or tongue -increased blood pressure -severe stomach pain -signs and symptoms of high blood sugar such as dizziness; dry mouth; dry skin; fruity breath; nausea; stomach pain; increased hunger or thirst; increased urination -signs and symptoms of low blood sugar such as feeling anxious; confusion; dizziness; increased hunger; unusually weak or tired; sweating; shakiness; cold; irritable; headache; blurred vision; fast heartbeat; loss of consciousness -unusually slow heartbeat Side effects that usually do not require medical attention (report to your doctor or health care professional if they continue or are bothersome): -constipation -diarrhea -dizziness -headache -muscle pain -muscle spasms -nausea -pain, redness, or irritation at site where injected This list may not describe all possible side effects. Call your doctor for medical advice about side effects. You may report side effects to FDA at 1-800-FDA-1088. Where should I keep my medicine? This drug is given in a hospital or clinic and will not be stored at home. NOTE: This sheet is a summary. It may not cover all possible information. If you have questions about this medicine, talk to your doctor, pharmacist, or health care provider.  2018  Elsevier/Gold Standard (2015-11-02 10:33:47)

## 2017-05-05 ENCOUNTER — Ambulatory Visit (INDEPENDENT_AMBULATORY_CARE_PROVIDER_SITE_OTHER): Payer: 59 | Admitting: Family Medicine

## 2017-05-05 ENCOUNTER — Encounter: Payer: Self-pay | Admitting: Family Medicine

## 2017-05-05 VITALS — BP 130/74 | HR 82 | Temp 98.6°F | Ht 59.0 in | Wt 219.4 lb

## 2017-05-05 DIAGNOSIS — E782 Mixed hyperlipidemia: Secondary | ICD-10-CM

## 2017-05-05 DIAGNOSIS — Z0000 Encounter for general adult medical examination without abnormal findings: Secondary | ICD-10-CM | POA: Diagnosis not present

## 2017-05-05 DIAGNOSIS — E119 Type 2 diabetes mellitus without complications: Secondary | ICD-10-CM

## 2017-05-05 LAB — CBC WITH DIFFERENTIAL/PLATELET
Basophils Absolute: 0.1 10*3/uL (ref 0.0–0.1)
Basophils Relative: 0.5 % (ref 0.0–3.0)
Eosinophils Absolute: 0.2 10*3/uL (ref 0.0–0.7)
Eosinophils Relative: 1.5 % (ref 0.0–5.0)
HCT: 43.6 % (ref 36.0–46.0)
Hemoglobin: 14.5 g/dL (ref 12.0–15.0)
Lymphocytes Relative: 25.8 % (ref 12.0–46.0)
Lymphs Abs: 3.4 10*3/uL (ref 0.7–4.0)
MCHC: 33.2 g/dL (ref 30.0–36.0)
MCV: 91.2 fl (ref 78.0–100.0)
Monocytes Absolute: 0.8 10*3/uL (ref 0.1–1.0)
Monocytes Relative: 6.2 % (ref 3.0–12.0)
Neutro Abs: 8.7 10*3/uL — ABNORMAL HIGH (ref 1.4–7.7)
Neutrophils Relative %: 66 % (ref 43.0–77.0)
Platelets: 290 10*3/uL (ref 150.0–400.0)
RBC: 4.78 Mil/uL (ref 3.87–5.11)
RDW: 14.4 % (ref 11.5–15.5)
WBC: 13.2 10*3/uL — ABNORMAL HIGH (ref 4.0–10.5)

## 2017-05-05 LAB — LIPID PANEL
Cholesterol: 179 mg/dL (ref 0–200)
HDL: 58.4 mg/dL (ref >39.00)
LDL Cholesterol: 87 mg/dL (ref 0–99)
NonHDL: 120.4
Total CHOL/HDL Ratio: 3
Triglycerides: 167 mg/dL — ABNORMAL HIGH (ref 0.0–149.0)
VLDL: 33.4 mg/dL (ref 0.0–40.0)

## 2017-05-05 LAB — MICROALBUMIN / CREATININE URINE RATIO
Creatinine,U: 148.1 mg/dL
Microalb Creat Ratio: 0.9 mg/g (ref 0.0–30.0)
Microalb, Ur: 1.3 mg/dL (ref 0.0–1.9)

## 2017-05-05 LAB — COMPREHENSIVE METABOLIC PANEL
ALT: 27 U/L (ref 0–35)
AST: 22 U/L (ref 0–37)
Albumin: 4 g/dL (ref 3.5–5.2)
Alkaline Phosphatase: 85 U/L (ref 39–117)
BUN: 19 mg/dL (ref 6–23)
CO2: 21 mEq/L (ref 19–32)
Calcium: 9.1 mg/dL (ref 8.4–10.5)
Chloride: 103 mEq/L (ref 96–112)
Creatinine, Ser: 0.7 mg/dL (ref 0.40–1.20)
GFR: 91.3 mL/min (ref >60.00)
Glucose, Bld: 148 mg/dL — ABNORMAL HIGH (ref 70–99)
Potassium: 4.1 mEq/L (ref 3.5–5.1)
Sodium: 135 mEq/L (ref 135–145)
Total Bilirubin: 0.5 mg/dL (ref 0.2–1.2)
Total Protein: 7.5 g/dL (ref 6.0–8.3)

## 2017-05-05 MED ORDER — PHENTERMINE-TOPIRAMATE ER 7.5-46 MG PO CP24: 7.5000 mg | ORAL_CAPSULE | Freq: Every day | ORAL | 0 refills | Status: DC

## 2017-05-05 MED ORDER — VALACYCLOVIR HCL 500 MG PO TABS: 500.0000 mg | ORAL_TABLET | Freq: Two times a day (BID) | ORAL | 0 refills | Status: DC | PRN

## 2017-05-05 NOTE — Progress Notes (Deleted)
Leah [name] is a 57 y.o. female is here for follow up.  History of Present Illness:   [name], CMA acting as scribe for Dr. Briscoe Deutscher.   HPI: Patient in office today for physical and medication refills.   No diagnosis found.  Review of Systems  Constitutional: Negative for chills and fever.  HENT: Negative for hearing loss.   Eyes: Positive for blurred vision. Negative for double vision and photophobia.       Has appointment next week   Respiratory: Negative for cough.   Cardiovascular: Negative for chest pain and palpitations.  Gastrointestinal: Negative for nausea and vomiting.  Genitourinary: Negative for dysuria and urgency.  Musculoskeletal: Negative for myalgias.  Skin: Negative for rash.  Neurological: Negative for dizziness and headaches.    Health Maintenance Due  Topic Date Due  . OPHTHALMOLOGY EXAM  12/11/2016   Depression screen [facility] 2/9 05/05/2017 12/15/2016 05/03/2013  Decreased Interest 0 0 0  Down, Depressed, Hopeless 0 0 0  PHQ - 2 Score 0 0 0  Altered sleeping 0 - -  Tired, decreased energy 0 - -  Change in appetite 0 - -  Feeling bad or failure about yourself  0 - -  Trouble concentrating 0 - -  Moving slowly or fidgety/restless 0 - -  Suicidal thoughts 0 - -  PHQ-9 Score 0 - -  Difficult doing work/chores Not difficult at all - -  Some recent data might be hidden   PMHx, SurgHx, SocialHx, FamHx, Medications, and Allergies were reviewed in the Visit Navigator and updated as appropriate.   Patient Active Problem List   Diagnosis Date Noted  . Atrophic vaginitis 04/12/2016  . Hepatic encephalopathy ([place]) 04/12/2016  . Osteopenia 09/27/2015  . Branch retinal vein occlusion of left eye 12/12/2014  . Hypertensive retinopathy of both eyes 12/12/2014  . Lung nodule seen on imaging study 05/01/2014  . Diabetes mellitus type 2, controlled ([place]) 03/17/2013  . Morbid obesity ([place]) 03/01/2013  . Carcinoid syndrome ([place]) 02/21/2013  .  Retinopathy 01/18/2013  . 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 Malignant neoplasm of breast ([place]) 01/08/2011  . 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 Hypertension 10/22/2010  . GERD (gastroesophageal reflux disease) 10/22/2010  . Allergic rhinitis 10/22/2010  . History of bilateral mastectomy 10/22/2010  . Sarcoidosis 10/22/2010  . Osteoarthritis 10/22/2010  . Anxiety 10/22/2010   Social History   Tobacco Use  . Smoking status: Never Smoker  . Smokeless tobacco: Never Used  . Tobacco comment: never used tobacco  Substance Use Topics  . Alcohol use: No    Alcohol/week: 0.0 oz  . Drug use: No   Current Medications and Allergies:   Current Outpatient Medications:  .  amLODipine (NORVASC) 10 MG tablet, TAKE 1 TABLET BY MOUTH AT BEDTIME, Disp: 90 tablet, Rfl: 1 .  aspirin 81 MG EC tablet, TAKE 1 TABLET (81 MG TOTAL) BY MOUTH DAILY., Disp: 90 tablet, Rfl: 0 .  fexofenadine ([employer] ALLERGY RELIEF) 180 MG tablet, Take 1 tablet (180 mg total) by mouth every morning., Disp: 90 tablet, Rfl: 3 .  fluticasone (FLONASE) 50 MCG/ACT nasal spray, USE 2 SPRAYS IN BOTH NOSTRILS EVERY DAY, Disp: 16 g, Rfl: 2 .  gabapentin (NEURONTIN) 100 MG capsule, Take 1 capsule (100 mg total) by mouth at bedtime., Disp: 90 capsule, Rfl: 3 .  ketoconazole (NIZORAL) 2 % cream, Apply 1 application topically 2 (two) times daily., Disp: 15 g, Rfl: 0 .  metoprolol succinate (  TOPROL-XL) 100 MG 24 hr tablet, TAKE 1 TABLET BY MOUTH EVERY EVENING WITH OR IMMEDIATELY FOLLOWING A MEAL, Disp: 90 tablet, Rfl: 0 .  montelukast (SINGULAIR) 10 MG tablet, Take 1 tablet (10 mg total) by mouth at bedtime., Disp: 90 tablet, Rfl: 0 .  octreotide (SANDOSTATIN LAR) 30 MG injection, Inject 30 mg into the muscle every 28 (twenty-eight) days., Disp: , Rfl:  .  ondansetron (ZOFRAN-ODT) 4 MG disintegrating tablet, DISSOLVE 1 TABLET BY MOUTH EVERY 8 HOURS AS NEEDED FOR NAUSEA AND VOMITING, Disp: 90 tablet, Rfl: 2 .   OSPHENA 60 MG TABS, TAKE ONE TABLET BY MOUTH DAILY, Disp: 90 tablet, Rfl: 3 .  oxyCODONE (ROXICODONE) 5 MG immediate release tablet, Take 1 tablet (5 mg total) by mouth every 4 (four) hours as needed for severe pain., Disp: 30 tablet, Rfl: 0 .  pantoprazole (PROTONIX) 40 MG tablet, TAKE 1 TABLET BY MOUTH TWICE A DAY, Disp: 180 tablet, Rfl: 0 .  Phentermine-Topiramate 7.5-46 MG CP24, Take 7.5-46 mg by mouth daily., Disp: 90 capsule, Rfl: 0 .  PROAIR HFA 108 (90 Base) MCG/ACT inhaler, INHALE 2 PUFFS INTO THE LUNGS EVERY 6 (SIX) HOURS AS NEEDED. FOR SHORTNESS OF BREATH., Disp: 6.7 Inhaler, Rfl: 4 .  valACYclovir (VALTREX) 500 MG tablet, Take 500 mg by mouth 2 (two) times daily as needed. Cold sores, Disp: , Rfl:  .  valsartan (DIOVAN) 320 MG tablet, TAKE 1 TABLET (320 MG TOTAL) BY MOUTH DAILY., Disp: 90 tablet, Rfl: 1 .  venlafaxine XR (EFFEXOR-XR) 150 MG 24 hr capsule, TAKE 1 CAPSULE (150 MG TOTAL) BY MOUTH EACH MORNING, Disp: 90 capsule, Rfl: 3  No Known Allergies Review of Systems   Pertinent items are noted in the HPI. Otherwise, ROS is negative.  Vitals:   Vitals:   05/05/17 0954  BP: 130/74  Pulse: 82  Temp: 98.6 F (37 C)  TempSrc: Oral  SpO2: 96%  Weight: 219 lb 6.4 oz (99.5 kg)  Height: 4\' 11"  (1.499 m)     Body mass index is 44.31 kg/m.  Physical Exam:   Physical Exam Results for orders placed or performed in visit on 01/28/17  POCT glycosylated hemoglobin (Hb A1C)  Result Value Ref Range   Hemoglobin A1C 7.3     Assessment and Plan:   ***

## 2017-05-05 NOTE — Progress Notes (Signed)
Subjective:    Leah [name] is a 57 y.o. female and is here for a comprehensive physical exam.  Wt Readings from Last 3 Encounters:  05/05/17 219 lb 6.4 oz (99.5 kg)  04/21/17 223 lb 6.4 oz (101.3 kg)  04/03/17 225 lb (102.1 kg)   Health Maintenance Due  Topic Date Due  . OPHTHALMOLOGY EXAM  12/11/2016   PMHx, SurgHx, SocialHx, Medications, and Allergies were reviewed in the Visit Navigator and updated as appropriate.   Past Medical History:  Diagnosis Date  . Allergic rhinitis   . Atrophic vaginitis   . Borderline diabetes 03/17/2013  . Carcinoid syndrome ([place])   . GE reflux   . Hepatic encephalopathy ([place]) 2011  . History of PCR DNA positive for HSV1   . History of retinal vein occlusion, right eye   . HTN (hypertension) 2011  . Migraines   . Neuroendocrine tumor 2011  . Orthostatic hypotension   . Palpitations   . Sarcoidosis 2008  . Surgical menopause    Past Surgical History:  Procedure Laterality Date  . ABDOMINAL HYSTERECTOMY  2004  . BREAST BIOPSY Left   . CESAREAN SECTION  [id number]  . CHOLECYSTECTOMY  2006  . hepatetetomy    . KNEE ARTHROSCOPY Left 09/2010  . MASTECTOMY Bilateral 2008  . OOPHORECTOMY  2004  . RECONSTRUCTION BREAST W/ LATISSIMUS DORSI FLAP Bilateral   . TONSILLECTOMY  1976  . TUBAL LIGATION Bilateral 2001   Social History   Tobacco Use  . Smoking status: Never Smoker  . Smokeless tobacco: Never Used  . Tobacco comment: never used tobacco  Substance Use Topics  . Alcohol use: No    Alcohol/week: 0.0 oz  . Drug use: No   Current Outpatient Medications:  .  amLODipine (NORVASC) 10 MG tablet, TAKE 1 TABLET BY MOUTH AT BEDTIME, Disp: 90 tablet, Rfl: 1 .  aspirin 81 MG EC tablet, TAKE 1 TABLET (81 MG TOTAL) BY MOUTH DAILY., Disp: 90 tablet, Rfl: 0 .  fexofenadine ([facility] ALLERGY RELIEF) 180 MG tablet, Take 1 tablet (180 mg total) by mouth every morning., Disp: 90 tablet, Rfl: 3 .  fluticasone (FLONASE) 50 MCG/ACT nasal spray, USE 2  SPRAYS IN BOTH NOSTRILS EVERY DAY, Disp: 16 g, Rfl: 2 .  gabapentin (NEURONTIN) 100 MG capsule, Take 1 capsule (100 mg total) by mouth at bedtime., Disp: 90 capsule, Rfl: 3 .  ketoconazole (NIZORAL) 2 % cream, Apply 1 application topically 2 (two) times daily., Disp: 15 g, Rfl: 0 .  metoprolol succinate (TOPROL-XL) 100 MG 24 hr tablet, TAKE 1 TABLET BY MOUTH EVERY EVENING WITH OR IMMEDIATELY FOLLOWING A MEAL, Disp: 90 tablet, Rfl: 0 .  montelukast (SINGULAIR) 10 MG tablet, Take 1 tablet (10 mg total) by mouth at bedtime., Disp: 90 tablet, Rfl: 0 .  octreotide (SANDOSTATIN LAR) 30 MG injection, Inject 30 mg into the muscle every 28 (twenty-eight) days., Disp: , Rfl:  .  ondansetron (ZOFRAN-ODT) 4 MG disintegrating tablet, DISSOLVE 1 TABLET BY MOUTH EVERY 8 HOURS AS NEEDED FOR NAUSEA AND VOMITING, Disp: 90 tablet, Rfl: 2 .  OSPHENA 60 MG TABS, TAKE ONE TABLET BY MOUTH DAILY, Disp: 90 tablet, Rfl: 3 .  oxyCODONE (ROXICODONE) 5 MG immediate release tablet, Take 1 tablet (5 mg total) by mouth every 4 (four) hours as needed for severe pain., Disp: 30 tablet, Rfl: 0 .  pantoprazole (PROTONIX) 40 MG tablet, TAKE 1 TABLET BY MOUTH TWICE A DAY, Disp: 180 tablet, Rfl: 0 .  Phentermine-Topiramate  7.5-46 MG CP24, Take 7.5-46 mg by mouth daily., Disp: 90 capsule, Rfl: 0 .  PROAIR HFA 108 (90 Base) MCG/ACT inhaler, INHALE 2 PUFFS INTO THE LUNGS EVERY 6 (SIX) HOURS AS NEEDED. FOR SHORTNESS OF BREATH., Disp: 6.7 Inhaler, Rfl: 4 .  valACYclovir (VALTREX) 500 MG tablet, Take 1 tablet (500 mg total) by mouth 2 (two) times daily as needed. Cold sores, Disp: 30 tablet, Rfl: 0 .  valsartan (DIOVAN) 320 MG tablet, TAKE 1 TABLET (320 MG TOTAL) BY MOUTH DAILY., Disp: 90 tablet, Rfl: 1 .  venlafaxine XR (EFFEXOR-XR) 150 MG 24 hr capsule, TAKE 1 CAPSULE (150 MG TOTAL) BY MOUTH EACH MORNING, Disp: 90 capsule, Rfl: 3  Review of Systems:   Pertinent items are noted in the HPI. Otherwise, ROS is negative.  Objective:   BP  130/74   Pulse 82   Temp 98.6 F (37 C) (Oral)   Ht 4\' 11"  (1.499 m)   Wt 219 lb 6.4 oz (99.5 kg)   SpO2 96%   BMI 44.31 kg/m    Wt Readings from Last 3 Encounters:  05/05/17 219 lb 6.4 oz (99.5 kg)  04/21/17 223 lb 6.4 oz (101.3 kg)  04/03/17 225 lb (102.1 kg)     Ht Readings from Last 3 Encounters:  05/05/17 4\' 11"  (1.499 m)  04/21/17 4\' 11"  (1.499 m)  04/03/17 4\' 11"  (1.499 m)   General appearance: alert, cooperative and appears stated age. Head: normocephalic, without obvious abnormality, atraumatic. Neck: no adenopathy, supple, symmetrical, trachea midline; thyroid not enlarged, symmetric, no tenderness/mass/nodules. Lungs: clear to auscultation bilaterally. Heart: regular rate and rhythm Abdomen: soft, non-tender; no masses,  no organomegaly. Extremities: extremities normal, atraumatic, no cyanosis or edema. Skin: skin color, texture, turgor normal, no rashes or lesions. Lymph: cervical, supraclavicular, and axillary nodes normal; no abnormal inguinal nodes palpated. Neurologic: grossly normal.  Assessment/Plan:   Leah [name] was seen today for annual exam.  Diagnoses and all orders for this visit:  Routine physical examination  Controlled type 2 diabetes mellitus without complication, without long-term current use of insulin ([place]) -     Microalbumin / creatinine urine ratio -     CBC with Differential/Platelet -     Comprehensive metabolic panel  Morbid obesity ([place]) Comments: Responding well to Qsymia. Down 4 pounds/month. Will continue for 3 months and recheck. Orders: -     Phentermine-Topiramate 7.5-46 MG CP24; Take 7.5-46 mg by mouth daily.  Mixed hyperlipidemia -     Lipid panel  Other orders -     valACYclovir (VALTREX) 500 MG tablet; Take 1 tablet (500 mg total) by mouth 2 (two) times daily as needed. Cold sores   Patient Counseling: [x]    Nutrition: Stressed importance of moderation in sodium/caffeine intake, saturated fat and cholesterol, caloric  balance, sufficient intake of fresh fruits, vegetables, fiber, calcium, iron, and 1 mg of folate supplement per day (for females capable of pregnancy).  [x]    Stressed the importance of regular exercise.   [x]    Substance Abuse: Discussed cessation/primary prevention of tobacco, alcohol, or other drug use; driving or other dangerous activities under the influence; availability of treatment for abuse.   [x]    Injury prevention: Discussed safety belts, safety helmets, smoke detector, smoking near bedding or upholstery.   [x]    Sexuality: Discussed sexually transmitted diseases, partner selection, use of condoms, avoidance of unintended pregnancy  and contraceptive alternatives.  [x]    Dental health: Discussed importance of regular tooth brushing, flossing, and dental visits.  [x]   Health maintenance and immunizations reviewed. Please refer to Health maintenance section.   Briscoe Deutscher, DO [place]

## 2017-05-06 ENCOUNTER — Telehealth: Payer: Self-pay | Admitting: Family Medicine

## 2017-05-06 DIAGNOSIS — M4722 Other spondylosis with radiculopathy, cervical region: Secondary | ICD-10-CM

## 2017-05-06 MED ORDER — OXYCODONE HCL 5 MG PO TABS: 5.0000 mg | ORAL_TABLET | ORAL | 0 refills | Status: DC | PRN

## 2017-05-06 NOTE — Telephone Encounter (Signed)
Ok to refill 

## 2017-05-06 NOTE — Telephone Encounter (Signed)
Copied from [place] [id number]. Topic: Quick Communication - Rx Refill/Question >> May 06, 2017  8:58 AM Boyd Kerbs wrote: Medication: 3 month supply   oxyCODONE (ROXICODONE) 5 MG immediate release tablet Xerese  Pt. Was in yesterday and was to have prescriptions called in. Please call if she needs to pick up   Has the patient contacted their pharmacy? No.  (Agent: If no, request that the patient contact the pharmacy for the refill.) Preferred Pharmacy (with phone number or street name):   [id number] - [place], [place] - [address] [place] [address] Phone: [phone] Fax: [phone]   Agent: Please be advised that RX refills may take up to 3 business days. We ask that you follow-up with your pharmacy.

## 2017-05-06 NOTE — Telephone Encounter (Signed)
See note

## 2017-05-08 LAB — HM DIABETES EYE EXAM

## 2017-05-15 ENCOUNTER — Encounter: Payer: Self-pay | Admitting: Family Medicine

## 2017-05-15 ENCOUNTER — Other Ambulatory Visit: Payer: Self-pay

## 2017-05-19 ENCOUNTER — Other Ambulatory Visit: Payer: Self-pay

## 2017-05-19 MED ORDER — ACYCLOVIR-HYDROCORTISONE 5-1 % EX CREA: TOPICAL_CREAM | CUTANEOUS | 0 refills | Status: DC

## 2017-05-26 ENCOUNTER — Inpatient Hospital Stay: Payer: 59 | Attending: Hematology & Oncology

## 2017-05-26 ENCOUNTER — Encounter: Payer: Self-pay | Admitting: Hematology & Oncology

## 2017-05-26 ENCOUNTER — Inpatient Hospital Stay (HOSPITAL_BASED_OUTPATIENT_CLINIC_OR_DEPARTMENT_OTHER): Payer: 59 | Admitting: Hematology & Oncology

## 2017-05-26 ENCOUNTER — Other Ambulatory Visit: Payer: Self-pay

## 2017-05-26 VITALS — BP 145/80 | HR 86 | Temp 98.9°F | Resp 18 | Wt 219.0 lb

## 2017-05-26 DIAGNOSIS — C7A00 Malignant carcinoid tumor of unspecified site: Secondary | ICD-10-CM

## 2017-05-26 DIAGNOSIS — C7B8 Other secondary neuroendocrine tumors: Secondary | ICD-10-CM

## 2017-05-26 DIAGNOSIS — C7B02 Secondary carcinoid tumors of liver: Secondary | ICD-10-CM | POA: Insufficient documentation

## 2017-05-26 LAB — CMP (CANCER CENTER ONLY)
ALBUMIN: 3.7 g/dL (ref 3.5–5.0)
ALK PHOS: 99 U/L — AB (ref 26–84)
ALT: 28 U/L (ref 10–47)
AST: 24 U/L (ref 11–38)
Anion gap: 9 (ref 5–15)
BUN: 19 mg/dL (ref 7–22)
CHLORIDE: 107 mmol/L (ref 98–108)
CO2: 28 mmol/L (ref 18–33)
Calcium: 9.3 mg/dL (ref 8.0–10.3)
Creatinine: 0.9 mg/dL (ref 0.60–1.20)
GLUCOSE: 141 mg/dL — AB (ref 73–118)
Potassium: 4.3 mmol/L (ref 3.3–4.7)
SODIUM: 144 mmol/L (ref 128–145)
Total Bilirubin: 0.6 mg/dL (ref 0.2–1.6)
Total Protein: 7.7 g/dL (ref 6.4–8.1)

## 2017-05-26 LAB — CBC WITH DIFFERENTIAL (CANCER CENTER ONLY)
BASOS PCT: 0 %
Basophils Absolute: 0 10*3/uL (ref 0.0–0.1)
EOS ABS: 0.2 10*3/uL (ref 0.0–0.5)
EOS PCT: 2 %
HCT: 41.9 % (ref 34.8–46.6)
HEMOGLOBIN: 13.7 g/dL (ref 11.6–15.9)
Lymphocytes Relative: 24 %
Lymphs Abs: 2.6 10*3/uL (ref 0.9–3.3)
MCH: 30.6 pg (ref 26.0–34.0)
MCHC: 32.7 g/dL (ref 32.0–36.0)
MCV: 93.7 fL (ref 81.0–101.0)
Monocytes Absolute: 0.6 10*3/uL (ref 0.1–0.9)
Monocytes Relative: 6 %
NEUTROS PCT: 68 %
Neutro Abs: 7.3 10*3/uL — ABNORMAL HIGH (ref 1.5–6.5)
PLATELETS: 280 10*3/uL (ref 145–400)
RBC: 4.47 MIL/uL (ref 3.70–5.32)
RDW: 13.9 % (ref 11.1–15.7)
WBC Count: 10.8 10*3/uL — ABNORMAL HIGH (ref 3.9–10.0)

## 2017-05-26 NOTE — Progress Notes (Signed)
Hematology and Oncology Follow Up Visit  TAETUM FLEWELLEN [id number] 1961-01-29 57 y.o. 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 Christine is here today for a follow-up.  She is doing quite well.  She has had no problems since we last saw her.  I am so happy that she is losing weight.  She has lost about 20 pounds since we last saw her.  She goes to do aquatic therapy at the [facility].  She was seen at [facility] recently.  It sounds like she may have had some growth of her carcinoid.  She is not sure when she goes back to see her surgeon.  She has had no problems with diarrhea.  She has had no flushing.  There is been no cough.  She has had no shortness of breath.  Her last chromogranin A was normal at 2.  She has had no problems with rashes.  Is been no leg swelling.  She had no abdominal pain.  Overall, her performance status is ECOG 1.   Medications:  Allergies as of 05/26/2017   No Known Allergies     Medication List        Accurate as of 05/26/17 11:52 AM. Always use your most recent med list.          Acyclovir-Hydrocortisone 5-1 % Crea Apply to effected area   amLODipine 10 MG tablet Commonly known as:  NORVASC TAKE 1 TABLET BY MOUTH AT BEDTIME   aspirin 81 MG EC tablet TAKE 1 TABLET (81 MG TOTAL) BY MOUTH DAILY.   fexofenadine 180 MG tablet Commonly known as:  [facility] ALLERGY RELIEF Take 1 tablet (180 mg total) by mouth every morning.   fluticasone 50 MCG/ACT nasal spray Commonly known as:  FLONASE USE 2 SPRAYS IN BOTH NOSTRILS EVERY DAY   gabapentin 100 MG capsule Commonly known as:  NEURONTIN Take 1 capsule (100 mg total) by mouth at bedtime.   ketoconazole 2 % cream Commonly known as:  NIZORAL Apply 1 application topically 2 (two) times daily.   metoprolol succinate 100  MG 24 hr tablet Commonly known as:  TOPROL-XL TAKE 1 TABLET BY MOUTH EVERY EVENING WITH OR IMMEDIATELY FOLLOWING A MEAL   montelukast 10 MG tablet Commonly known as:  SINGULAIR Take 1 tablet (10 mg total) by mouth at bedtime.   octreotide 30 MG injection Commonly known as:  SANDOSTATIN LAR Inject 30 mg into the muscle every 28 (twenty-eight) days.   ondansetron 4 MG disintegrating tablet Commonly known as:  ZOFRAN-ODT DISSOLVE 1 TABLET BY MOUTH EVERY 8 HOURS AS NEEDED FOR NAUSEA AND VOMITING   OSPHENA 60 MG Tabs Generic drug:  Ospemifene TAKE ONE TABLET BY MOUTH DAILY   oxyCODONE 5 MG immediate release tablet Commonly known as:  ROXICODONE Take 1 tablet (5 mg total) by mouth every 4 (four) hours as needed for severe pain.   pantoprazole 40 MG tablet Commonly known as:  PROTONIX TAKE 1 TABLET BY MOUTH TWICE A DAY   Phentermine-Topiramate 7.5-46 MG Cp24 Take 7.5-46 mg by mouth daily.   PROAIR HFA 108 (90 Base) MCG/ACT inhaler Generic drug:  albuterol INHALE 2 PUFFS INTO THE LUNGS EVERY 6 (SIX) HOURS AS NEEDED. FOR SHORTNESS OF BREATH.   valACYclovir 500 MG tablet Commonly known as:  VALTREX Take 1 tablet (500 mg total) by mouth  2 (two) times daily as needed. Cold sores   valsartan 320 MG tablet Commonly known as:  DIOVAN TAKE 1 TABLET (320 MG TOTAL) BY MOUTH DAILY.   venlafaxine XR 150 MG 24 hr capsule Commonly known as:  EFFEXOR-XR TAKE 1 CAPSULE (150 MG TOTAL) BY MOUTH EACH MORNING       Allergies: No Known Allergies  Past Medical History, Surgical history, Social history, and Family History were reviewed and updated.  Review of Systems: Review of Systems  Constitutional: Negative.   HENT: Negative.   Eyes: Negative.   Respiratory: Negative.   Cardiovascular: Negative.   Gastrointestinal: Negative.   Genitourinary: Negative.   Musculoskeletal: Negative.   Skin: Negative.   Neurological: Negative.   Endo/Heme/Allergies: Negative.     Psychiatric/Behavioral: Negative.      Physical Exam:  weight is 219 lb (99.3 kg). Her oral temperature is 98.9 F (37.2 C). Her blood pressure is 145/80 (abnormal) and her pulse is 86. Her respiration is 18 and oxygen saturation is 100%.   Wt Readings from Last 3 Encounters:  05/26/17 219 lb (99.3 kg)  05/05/17 219 lb 6.4 oz (99.5 kg)  04/21/17 223 lb 6.4 oz (101.3 kg)    Physical Exam  Constitutional: She is oriented to person, place, and time.  HENT:  Head: Normocephalic and atraumatic.  Mouth/Throat: Oropharynx is clear and moist.  Eyes: Pupils are equal, round, and reactive to light. EOM are normal.  Neck: Normal range of motion.  Cardiovascular: Normal rate, regular rhythm and normal heart sounds.  Pulmonary/Chest: Effort normal and breath sounds normal.  Abdominal: Soft. Bowel sounds are normal.  Musculoskeletal: Normal range of motion. She exhibits no edema, tenderness or deformity.  Lymphadenopathy:    She has no cervical adenopathy.  Neurological: She is alert and oriented to person, place, and time.  Skin: Skin is warm and dry. No rash noted. No erythema.  Psychiatric: She has a normal mood and affect. Her behavior is normal. Judgment and thought content normal.  Vitals reviewed.    Lab Results  Component Value Date   WBC 10.8 (H) 05/26/2017   HGB 14.5 05/05/2017   HCT 41.9 05/26/2017   MCV 93.7 05/26/2017   PLT 280 05/26/2017   Lab Results  Component Value Date   FERRITIN 382 (H) 10/18/2008   IRON <10 (L) 10/18/2008   TIBC NOT CALC Not calculated due to Iron <10. 10/18/2008   UIBC 156 10/18/2008   IRONPCTSAT NOT CALC Not calculated due to Iron <10. 10/18/2008   Lab Results  Component Value Date   RETICCTPCT 0.9 10/18/2008   RBC 4.47 05/26/2017   No results found for: KPAFRELGTCHN, LAMBDASER, KAPLAMBRATIO No results found for: IGGSERUM, IGA, IGMSERUM No results found for: Odetta Pink, SPEI    Chemistry      Component Value Date/Time   NA 144 05/26/2017 1027   NA 140 01/20/2017 1416   NA 141 01/17/2016 1047   K 4.3 05/26/2017 1027   K 3.9 01/20/2017 1416   K 3.2 (L) 01/17/2016 1047   CL 107 05/26/2017 1027   CL 102 01/20/2017 1416   CO2 28 05/26/2017 1027   CO2 26 01/20/2017 1416   CO2 27 01/17/2016 1047   BUN 19 05/26/2017 1027   BUN 15 01/20/2017 1416   BUN 6.6 (L) 01/17/2016 1047   CREATININE 0.90 05/26/2017 1027   CREATININE 1.1 01/20/2017 1416   CREATININE 0.7 01/17/2016 1047      Component Value  Date/Time   CALCIUM 9.3 05/26/2017 1027   CALCIUM 9.1 01/20/2017 1416   CALCIUM 8.7 01/17/2016 1047   ALKPHOS 99 (H) 05/26/2017 1027   ALKPHOS 84 01/20/2017 1416   ALKPHOS 90 01/17/2016 1047   AST 24 05/26/2017 1027   AST 34 01/17/2016 1047   ALT 28 05/26/2017 1027   ALT 35 01/20/2017 1416   ALT 38 01/17/2016 1047   BILITOT 0.6 05/26/2017 1027   BILITOT 0.42 01/17/2016 1047     Impression and Plan: Ms. Standre is 57 year old white female. She has a long-standing history of carcinoid.   From my point of view, I think she is done incredibly well.  We will go ahead and plan to see her in 3 months.  She comes in monthly for her Somatuline injection.  She thinks that she goes back to [facility] in a couple months.  She may need to get another CT scan.  I did tell her that there is the new radioisotope therapy for neuroendocrine cancers-Lutathera-that might be a consideration if she does have active disease.  I am very happy that she is losing weight and that she is doing so well losing weight by exercising.  We will plan to get her back in 3 more months.   [name], MD 4/16/201911:52 AM

## 2017-05-28 LAB — CHROMOGRANIN A: Chromogranin A: 2 nmol/L (ref 0–5)

## 2017-05-29 ENCOUNTER — Encounter: Payer: Self-pay | Admitting: Hematology & Oncology

## 2017-06-12 ENCOUNTER — Other Ambulatory Visit: Payer: Self-pay | Admitting: Family Medicine

## 2017-06-16 ENCOUNTER — Other Ambulatory Visit: Payer: Self-pay | Admitting: Family Medicine

## 2017-06-16 DIAGNOSIS — J301 Allergic rhinitis due to pollen: Secondary | ICD-10-CM

## 2017-06-23 ENCOUNTER — Other Ambulatory Visit: Payer: Self-pay | Admitting: Family

## 2017-06-23 ENCOUNTER — Inpatient Hospital Stay: Payer: 59 | Attending: Hematology & Oncology

## 2017-06-23 ENCOUNTER — Other Ambulatory Visit: Payer: Self-pay

## 2017-06-23 VITALS — BP 134/72 | HR 74 | Temp 98.2°F

## 2017-06-23 DIAGNOSIS — C7B02 Secondary carcinoid tumors of liver: Secondary | ICD-10-CM | POA: Insufficient documentation

## 2017-06-23 DIAGNOSIS — C7B8 Other secondary neuroendocrine tumors: Secondary | ICD-10-CM

## 2017-06-23 DIAGNOSIS — C7A00 Malignant carcinoid tumor of unspecified site: Secondary | ICD-10-CM | POA: Insufficient documentation

## 2017-06-23 MED ORDER — LANREOTIDE ACETATE 120 MG/0.5ML ~~LOC~~ SOLN
SUBCUTANEOUS | Status: AC
  Filled 2017-06-23: qty 120

## 2017-06-23 MED ORDER — LANREOTIDE ACETATE 120 MG/0.5ML ~~LOC~~ SOLN
120.0000 mg | Freq: Once | SUBCUTANEOUS | Status: AC
  Administered 2017-06-23: 120 mg via SUBCUTANEOUS

## 2017-06-23 NOTE — Patient Instructions (Signed)
Lanreotide injection What is this medicine? LANREOTIDE (lan REE oh tide) is used to reduce blood levels of growth hormone in patients with a condition called acromegaly. It also works to slow or stop tumor growth in patients with neuroendocrine tumors and treat carcinoid syndrome. This medicine may be used for other purposes; ask your health care provider or pharmacist if you have questions. COMMON BRAND NAME(S): Somatuline [place] What should I tell my health care provider before I take this medicine? They need to know if you have any of these conditions: -diabetes -gallbladder disease -heart disease -kidney disease -liver disease -thyroid disease -an unusual or allergic reaction to lanreotide, other medicines, foods, dyes, or preservatives -pregnant or trying to get pregnant -breast-feeding How should I use this medicine? This medicine is for injection under the skin. It is given by a health care professional in a hospital or clinic setting. Contact your pediatrician or health care professional regarding the use of this medicine in children. Special care may be needed. Overdosage: If you think you have taken too much of this medicine contact a poison control center or emergency room at once. NOTE: This medicine is only for you. Do not share this medicine with others. What if I miss a dose? It is important not to miss your dose. Call your doctor or health care professional if you are unable to keep an appointment. What may interact with this medicine? This medicine may interact with the following medications: -bromocriptine -cyclosporine -certain medicines for blood pressure, heart disease, irregular heart beat -certain medicines for diabetes -quinidine -terfenadine This list may not describe all possible interactions. Give your health care provider a list of all the medicines, herbs, non-prescription drugs, or dietary supplements you use. Also tell them if you smoke, drink alcohol, or  use illegal drugs. Some items may interact with your medicine. What should I watch for while using this medicine? Tell your doctor or healthcare professional if your symptoms do not start to get better or if they get worse. Visit your doctor or health care professional for regular checks on your progress. Your condition will be monitored carefully while you are receiving this medicine. You may need blood work done while you are taking this medicine. Women should inform their doctor if they wish to become pregnant or think they might be pregnant. There is a potential for serious side effects to an unborn child. Talk to your health care professional or pharmacist for more information. Do not breast-feed an infant while taking this medicine or for 6 months after stopping it. This medicine has caused ovarian failure in some women. This medicine may interfere with the ability to have a child. Talk with your doctor or health care professional if you are concerned about your fertility. What side effects may I notice from receiving this medicine? Side effects that you should report to your doctor or health care professional as soon as possible: -allergic reactions like skin rash, itching or hives, swelling of the face, lips, or tongue -increased blood pressure -severe stomach pain -signs and symptoms of high blood sugar such as dizziness; dry mouth; dry skin; fruity breath; nausea; stomach pain; increased hunger or thirst; increased urination -signs and symptoms of low blood sugar such as feeling anxious; confusion; dizziness; increased hunger; unusually weak or tired; sweating; shakiness; cold; irritable; headache; blurred vision; fast heartbeat; loss of consciousness -unusually slow heartbeat Side effects that usually do not require medical attention (report to your doctor or health care professional if they continue   or are bothersome): -constipation -diarrhea -dizziness -headache -muscle pain -muscle  spasms -nausea -pain, redness, or irritation at site where injected This list may not describe all possible side effects. Call your doctor for medical advice about side effects. You may report side effects to FDA at 1-800-FDA-1088. Where should I keep my medicine? This drug is given in a hospital or clinic and will not be stored at home. NOTE: This sheet is a summary. It may not cover all possible information. If you have questions about this medicine, talk to your doctor, pharmacist, or health care provider.  2018 Elsevier/Gold Standard (2015-11-02 10:33:47)  

## 2017-06-26 ENCOUNTER — Other Ambulatory Visit: Payer: Self-pay | Admitting: Family Medicine

## 2017-06-26 DIAGNOSIS — I10 Essential (primary) hypertension: Secondary | ICD-10-CM

## 2017-06-30 ENCOUNTER — Other Ambulatory Visit: Payer: Self-pay | Admitting: Family Medicine

## 2017-06-30 DIAGNOSIS — F419 Anxiety disorder, unspecified: Secondary | ICD-10-CM

## 2017-07-01 ENCOUNTER — Telehealth: Payer: Self-pay | Admitting: Cardiovascular Disease

## 2017-07-01 NOTE — Telephone Encounter (Signed)
   [facility] Pre-operative Risk Assessment    Request for surgical clearance:  1. What type of surgery is being performed? Left: TKA-Medial & Lateral w/wo Patella resurfacing   2. When is this surgery scheduled? 10/19/17   3. What type of clearance is required (medical clearance vs. Pharmacy clearance to hold med vs. Both)?  Medical  4. Are there any medications that need to be held prior to surgery and how long?  None specified   5. Practice name and name of physician performing surgery? [facility]; Gaynelle Arabian, MD   6. What is your office phone number [phone]   7.   What is your office fax number [phone]  8.   Anesthesia type (None, local, MAC, general) ? Choice  ---Pt has not been seen by Dr. Gwenlyn Found since [address] [facility] 07/01/2017, 2:25 PM  _________________________________________________________________   (provider comments below)

## 2017-07-01 NOTE — Telephone Encounter (Signed)
I have not seen her in the office in 4 years.  I cannot clear her for her procedure

## 2017-07-02 NOTE — Telephone Encounter (Signed)
Spoke with pt, Follow up scheduled for clearance. Will fax this note to [facility] orthopedic

## 2017-07-12 ENCOUNTER — Other Ambulatory Visit: Payer: Self-pay | Admitting: Family Medicine

## 2017-07-21 ENCOUNTER — Ambulatory Visit: Payer: 59 | Admitting: Cardiovascular Disease

## 2017-07-21 ENCOUNTER — Inpatient Hospital Stay: Payer: 59 | Attending: Hematology & Oncology

## 2017-07-21 VITALS — BP 151/80 | HR 80 | Temp 98.3°F | Resp 17

## 2017-07-21 DIAGNOSIS — C7B8 Other secondary neuroendocrine tumors: Secondary | ICD-10-CM

## 2017-07-21 DIAGNOSIS — C7A00 Malignant carcinoid tumor of unspecified site: Secondary | ICD-10-CM | POA: Diagnosis present

## 2017-07-21 DIAGNOSIS — C7B02 Secondary carcinoid tumors of liver: Secondary | ICD-10-CM | POA: Insufficient documentation

## 2017-07-21 MED ORDER — LANREOTIDE ACETATE 120 MG/0.5ML ~~LOC~~ SOLN
120.0000 mg | Freq: Once | SUBCUTANEOUS | Status: AC
  Administered 2017-07-21: 120 mg via SUBCUTANEOUS

## 2017-07-21 MED ORDER — LANREOTIDE ACETATE 120 MG/0.5ML ~~LOC~~ SOLN
SUBCUTANEOUS | Status: AC
  Filled 2017-07-21: qty 120

## 2017-07-21 NOTE — Patient Instructions (Signed)
Lanreotide injection What is this medicine? LANREOTIDE (lan REE oh tide) is used to reduce blood levels of growth hormone in patients with a condition called acromegaly. It also works to slow or stop tumor growth in patients with neuroendocrine tumors and treat carcinoid syndrome. This medicine may be used for other purposes; ask your health care provider or pharmacist if you have questions. COMMON BRAND NAME(S): Somatuline [place] What should I tell my health care provider before I take this medicine? They need to know if you have any of these conditions: -diabetes -gallbladder disease -heart disease -kidney disease -liver disease -thyroid disease -an unusual or allergic reaction to lanreotide, other medicines, foods, dyes, or preservatives -pregnant or trying to get pregnant -breast-feeding How should I use this medicine? This medicine is for injection under the skin. It is given by a health care professional in a hospital or clinic setting. Contact your pediatrician or health care professional regarding the use of this medicine in children. Special care may be needed. Overdosage: If you think you have taken too much of this medicine contact a poison control center or emergency room at once. NOTE: This medicine is only for you. Do not share this medicine with others. What if I miss a dose? It is important not to miss your dose. Call your doctor or health care professional if you are unable to keep an appointment. What may interact with this medicine? This medicine may interact with the following medications: -bromocriptine -cyclosporine -certain medicines for blood pressure, heart disease, irregular heart beat -certain medicines for diabetes -quinidine -terfenadine This list may not describe all possible interactions. Give your health care provider a list of all the medicines, herbs, non-prescription drugs, or dietary supplements you use. Also tell them if you smoke, drink alcohol, or  use illegal drugs. Some items may interact with your medicine. What should I watch for while using this medicine? Tell your doctor or healthcare professional if your symptoms do not start to get better or if they get worse. Visit your doctor or health care professional for regular checks on your progress. Your condition will be monitored carefully while you are receiving this medicine. You may need blood work done while you are taking this medicine. Women should inform their doctor if they wish to become pregnant or think they might be pregnant. There is a potential for serious side effects to an unborn child. Talk to your health care professional or pharmacist for more information. Do not breast-feed an infant while taking this medicine or for 6 months after stopping it. This medicine has caused ovarian failure in some women. This medicine may interfere with the ability to have a child. Talk with your doctor or health care professional if you are concerned about your fertility. What side effects may I notice from receiving this medicine? Side effects that you should report to your doctor or health care professional as soon as possible: -allergic reactions like skin rash, itching or hives, swelling of the face, lips, or tongue -increased blood pressure -severe stomach pain -signs and symptoms of high blood sugar such as dizziness; dry mouth; dry skin; fruity breath; nausea; stomach pain; increased hunger or thirst; increased urination -signs and symptoms of low blood sugar such as feeling anxious; confusion; dizziness; increased hunger; unusually weak or tired; sweating; shakiness; cold; irritable; headache; blurred vision; fast heartbeat; loss of consciousness -unusually slow heartbeat Side effects that usually do not require medical attention (report to your doctor or health care professional if they continue   or are bothersome): -constipation -diarrhea -dizziness -headache -muscle pain -muscle  spasms -nausea -pain, redness, or irritation at site where injected This list may not describe all possible side effects. Call your doctor for medical advice about side effects. You may report side effects to FDA at 1-800-FDA-1088. Where should I keep my medicine? This drug is given in a hospital or clinic and will not be stored at home. NOTE: This sheet is a summary. It may not cover all possible information. If you have questions about this medicine, talk to your doctor, pharmacist, or health care provider.  2018 Elsevier/Gold Standard (2015-11-02 10:33:47)  

## 2017-07-31 ENCOUNTER — Other Ambulatory Visit: Payer: Self-pay | Admitting: Family Medicine

## 2017-07-31 DIAGNOSIS — J301 Allergic rhinitis due to pollen: Secondary | ICD-10-CM

## 2017-08-13 ENCOUNTER — Other Ambulatory Visit: Payer: Self-pay | Admitting: Family Medicine

## 2017-08-18 ENCOUNTER — Encounter: Payer: Self-pay | Admitting: Hematology & Oncology

## 2017-08-18 ENCOUNTER — Inpatient Hospital Stay: Payer: 59 | Attending: Hematology & Oncology | Admitting: Family

## 2017-08-18 ENCOUNTER — Inpatient Hospital Stay: Payer: 59

## 2017-08-18 ENCOUNTER — Other Ambulatory Visit: Payer: Self-pay

## 2017-08-18 VITALS — BP 140/84 | HR 79 | Temp 98.3°F | Resp 16 | Ht 59.0 in | Wt 214.8 lb

## 2017-08-18 DIAGNOSIS — C7B8 Other secondary neuroendocrine tumors: Secondary | ICD-10-CM

## 2017-08-18 DIAGNOSIS — R5383 Other fatigue: Secondary | ICD-10-CM | POA: Diagnosis not present

## 2017-08-18 DIAGNOSIS — C7A00 Malignant carcinoid tumor of unspecified site: Secondary | ICD-10-CM | POA: Insufficient documentation

## 2017-08-18 DIAGNOSIS — C7B02 Secondary carcinoid tumors of liver: Secondary | ICD-10-CM | POA: Insufficient documentation

## 2017-08-18 DIAGNOSIS — R197 Diarrhea, unspecified: Secondary | ICD-10-CM | POA: Insufficient documentation

## 2017-08-18 LAB — CBC WITH DIFFERENTIAL (CANCER CENTER ONLY)
Basophils Absolute: 0 10*3/uL (ref 0.0–0.1)
Basophils Relative: 0 %
Eosinophils Absolute: 0.2 10*3/uL (ref 0.0–0.5)
Eosinophils Relative: 2 %
HEMATOCRIT: 41 % (ref 34.8–46.6)
HEMOGLOBIN: 13.7 g/dL (ref 11.6–15.9)
LYMPHS ABS: 2.8 10*3/uL (ref 0.9–3.3)
Lymphocytes Relative: 31 %
MCH: 30.7 pg (ref 26.0–34.0)
MCHC: 33.4 g/dL (ref 32.0–36.0)
MCV: 91.9 fL (ref 81.0–101.0)
MONOS PCT: 7 %
Monocytes Absolute: 0.6 10*3/uL (ref 0.1–0.9)
NEUTROS ABS: 5.4 10*3/uL (ref 1.5–6.5)
NEUTROS PCT: 60 %
Platelet Count: 237 10*3/uL (ref 145–400)
RBC: 4.46 MIL/uL (ref 3.70–5.32)
RDW: 13.4 % (ref 11.1–15.7)
WBC: 9.1 10*3/uL (ref 3.9–10.0)

## 2017-08-18 LAB — CMP (CANCER CENTER ONLY)
ALBUMIN: 3.3 g/dL — AB (ref 3.5–5.0)
ALT: 29 U/L (ref 10–47)
AST: 28 U/L (ref 11–38)
Alkaline Phosphatase: 89 U/L — ABNORMAL HIGH (ref 26–84)
Anion gap: 11 (ref 5–15)
BUN: 15 mg/dL (ref 7–22)
CHLORIDE: 107 mmol/L (ref 98–108)
CO2: 25 mmol/L (ref 18–33)
Calcium: 8.7 mg/dL (ref 8.0–10.3)
Creatinine: 0.8 mg/dL (ref 0.60–1.20)
GLUCOSE: 140 mg/dL — AB (ref 73–118)
POTASSIUM: 3.9 mmol/L (ref 3.3–4.7)
Sodium: 143 mmol/L (ref 128–145)
Total Bilirubin: 0.6 mg/dL (ref 0.2–1.6)
Total Protein: 7 g/dL (ref 6.4–8.1)

## 2017-08-18 LAB — LACTATE DEHYDROGENASE: LDH: 218 U/L — AB (ref 98–192)

## 2017-08-18 MED ORDER — LANREOTIDE ACETATE 120 MG/0.5ML ~~LOC~~ SOLN
120.0000 mg | Freq: Once | SUBCUTANEOUS | Status: AC
  Administered 2017-08-18: 120 mg via SUBCUTANEOUS

## 2017-08-18 MED ORDER — LANREOTIDE ACETATE 120 MG/0.5ML ~~LOC~~ SOLN
SUBCUTANEOUS | Status: AC
  Filled 2017-08-18: qty 120

## 2017-08-18 NOTE — Progress Notes (Signed)
Hematology and Oncology Follow Up Visit  Leah [name] [id number] 09-18-1960 57 y.o. 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 Cham is here today with her son for follow-up and injection. She is symptomatic with fatigue and sporadic episodes of diarrhea. She has not had any episodes of incontinence.  She states that she had a repeat MRI with [facility] last week which showed minimal increase in the size and conspicuity of the two hyperenhancing focal liver lesions near the resection margin, compatible with recurrence and has an appointment later today to discuss the results and treatment plan.  Chromogranin A  In April was 2. Today's level is pending.  She has not had any fever, chills, n/v, cough, rash, dizziness, SOB, chest pain, palpitations, abdominal pain or changes in bladder habits.  No swelling, numbness or tingling in her extremities. She states that she will be having a left knee replacement in September.  No episodes of bleeding, no bruising or petechiae.  No lymphadenopathy noted on exam.  She has a good appetite and is staying well hydrated. Her weight is down another 5 lbs which she is excited about. She is still going to water aerobics.   ECOG Performance Status: 1 - Symptomatic but completely ambulatory  Medications:  Allergies as of 08/18/2017   No Known Allergies     Medication List        Accurate as of 08/18/17  1:07 PM. Always use your most recent med list.          Acyclovir-Hydrocortisone 5-1 % Crea Commonly known as:  XERESE APPLY TO EFFECTED AREA   amLODipine 10 MG tablet Commonly known as:  NORVASC TAKE 1 TABLET BY MOUTH AT BEDTIME   aspirin 81 MG EC tablet TAKE 1 TABLET (81 MG TOTAL) BY MOUTH DAILY.   fexofenadine 180 MG tablet Commonly known as:   ALLEGRA TAKE 1 TABLET (180 MG TOTAL) BY MOUTH EVERY MORNING.   fluticasone 50 MCG/ACT nasal spray Commonly known as:  FLONASE USE 2 SPRAYS IN BOTH NOSTRILS EVERY DAY   gabapentin 100 MG capsule Commonly known as:  NEURONTIN Take 1 capsule (100 mg total) by mouth at bedtime.   ketoconazole 2 % cream Commonly known as:  NIZORAL Apply 1 application topically 2 (two) times daily.   metoprolol succinate 100 MG 24 hr tablet Commonly known as:  TOPROL-XL TAKE 1 TABLET BY MOUTH EVERY EVENING WITH OR IMMEDIATELY FOLLOWING A MEAL   montelukast 10 MG tablet Commonly known as:  SINGULAIR TAKE 1 TABLET BY MOUTH EVERYDAY AT BEDTIME   octreotide 30 MG injection Commonly known as:  SANDOSTATIN LAR Inject 30 mg into the muscle every 28 (twenty-eight) days.   ondansetron 4 MG disintegrating tablet Commonly known as:  ZOFRAN-ODT DISSOLVE 1 TABLET BY MOUTH EVERY 8 HOURS AS NEEDED FOR NAUSEA AND VOMITING   OSPHENA 60 MG Tabs Generic drug:  Ospemifene TAKE ONE TABLET BY MOUTH DAILY   oxyCODONE 5 MG immediate release tablet Commonly known as:  ROXICODONE Take 1 tablet (5 mg total) by mouth every 4 (four) hours as needed for severe pain.   pantoprazole 40 MG tablet Commonly known as:  PROTONIX TAKE 1 TABLET BY MOUTH TWICE A DAY   Phentermine-Topiramate 7.5-46 MG Cp24 Take 7.5-46 mg by mouth daily.   PROAIR HFA 108 (90 Base) MCG/ACT inhaler Generic drug:  albuterol INHALE 2 PUFFS INTO THE LUNGS EVERY 6 (SIX) HOURS AS NEEDED. FOR SHORTNESS OF BREATH.   valACYclovir 500 MG tablet Commonly known as:  VALTREX TAKE 1 TABLET (500 MG TOTAL) BY MOUTH 2 (TWO) TIMES DAILY AS NEEDED. COLD SORES   valsartan 320 MG tablet Commonly known as:  DIOVAN TAKE 1 TABLET (320 MG TOTAL) BY MOUTH DAILY.   venlafaxine XR 150 MG 24 hr capsule Commonly known as:  EFFEXOR-XR TAKE 1 CAPSULE (150 MG TOTAL) BY MOUTH EACH MORNING       Allergies: No Known Allergies  Past Medical History, Surgical history,  Social history, and Family History were reviewed and updated.  Review of Systems: All other 10 point review of systems is negative.   Physical Exam:  height is 4\' 11"  (1.499 m) and weight is 214 lb 12.8 oz (97.4 kg). Her oral temperature is 98.3 F (36.8 C). Her blood pressure is 140/84 and her pulse is 79. Her respiration is 16.   Wt Readings from Last 3 Encounters:  08/18/17 214 lb 12.8 oz (97.4 kg)  05/26/17 219 lb (99.3 kg)  05/05/17 219 lb 6.4 oz (99.5 kg)    Ocular: Sclerae unicteric, pupils equal, round and reactive to light Ear-nose-throat: Oropharynx clear, dentition fair Lymphatic: No cervical, supraclavicular or axillary adenopathy Lungs no rales or rhonchi, good excursion bilaterally Heart regular rate and rhythm, no murmur appreciated Abd soft, nontender, positive bowel sounds, no liver or spleen tip palpated on exam, no fluid wave  MSK no focal spinal tenderness, no joint edema Neuro: non-focal, well-oriented, appropriate affect Breasts: Deferred   Lab Results  Component Value Date   WBC 9.1 08/18/2017   HGB 13.7 08/18/2017   HCT 41.0 08/18/2017   MCV 91.9 08/18/2017   PLT 237 08/18/2017   Lab Results  Component Value Date   FERRITIN 382 (H) 10/18/2008   IRON <10 (L) 10/18/2008   TIBC NOT CALC Not calculated due to Iron <10. 10/18/2008   UIBC 156 10/18/2008   IRONPCTSAT NOT CALC Not calculated due to Iron <10. 10/18/2008   Lab Results  Component Value Date   RETICCTPCT 0.9 10/18/2008   RBC 4.46 08/18/2017   No results found for: KPAFRELGTCHN, LAMBDASER, KAPLAMBRATIO No results found for: IGGSERUM, IGA, IGMSERUM No results found for: Odetta Pink, SPEI   Chemistry      Component Value Date/Time   NA 143 08/18/2017 1110   NA 140 01/20/2017 1416   NA 141 01/17/2016 1047   K 3.9 08/18/2017 1110   K 3.9 01/20/2017 1416   K 3.2 (L) 01/17/2016 1047   CL 107 08/18/2017 1110   CL 102 01/20/2017 1416    CO2 25 08/18/2017 1110   CO2 26 01/20/2017 1416   CO2 27 01/17/2016 1047   BUN 15 08/18/2017 1110   BUN 15 01/20/2017 1416   BUN 6.6 (L) 01/17/2016 1047   CREATININE 0.80 08/18/2017 1110   CREATININE 1.1 01/20/2017 1416   CREATININE 0.7 01/17/2016 1047      Component Value Date/Time   CALCIUM 8.7 08/18/2017 1110   CALCIUM 9.1 01/20/2017 1416   CALCIUM 8.7 01/17/2016 1047   ALKPHOS 89 (H) 08/18/2017 1110   ALKPHOS 84 01/20/2017 1416   ALKPHOS 90 01/17/2016 1047   AST 28 08/18/2017 1110   AST 34 01/17/2016 1047   ALT 29 08/18/2017 1110   ALT 35 01/20/2017 1416   ALT 38 01/17/2016 1047   BILITOT 0.6 08/18/2017 1110  BILITOT 0.42 01/17/2016 1047      Impression and Plan: Ms. Ellerson is a very pleasant 57 yo caucasian female with long standing history of carcinoid. She is doing well on Somatuline injections monthly and received hers today as planned.  Repeat MRI last week with [facility] showed minimal increase in the size of the two hyperenhancing focal liver lesions near the resection margin, compatible with recurrence. She goes to see them today to discuss a treatment plan.  We will continue to see her monthly for injections and for follow-up in 3 months.  She will contact our office with any questions or concerns. We can certainly see her sooner if need be.   Laverna Peace, NP 7/9/20191:07 PM

## 2017-08-18 NOTE — Patient Instructions (Signed)
Lanreotide injection What is this medicine? LANREOTIDE (lan REE oh tide) is used to reduce blood levels of growth hormone in patients with a condition called acromegaly. It also works to slow or stop tumor growth in patients with neuroendocrine tumors and treat carcinoid syndrome. This medicine may be used for other purposes; ask your health care provider or pharmacist if you have questions. COMMON BRAND NAME(S): Somatuline [place] What should I tell my health care provider before I take this medicine? They need to know if you have any of these conditions: -diabetes -gallbladder disease -heart disease -kidney disease -liver disease -thyroid disease -an unusual or allergic reaction to lanreotide, other medicines, foods, dyes, or preservatives -pregnant or trying to get pregnant -breast-feeding How should I use this medicine? This medicine is for injection under the skin. It is given by a health care professional in a hospital or clinic setting. Contact your pediatrician or health care professional regarding the use of this medicine in children. Special care may be needed. Overdosage: If you think you have taken too much of this medicine contact a poison control center or emergency room at once. NOTE: This medicine is only for you. Do not share this medicine with others. What if I miss a dose? It is important not to miss your dose. Call your doctor or health care professional if you are unable to keep an appointment. What may interact with this medicine? This medicine may interact with the following medications: -bromocriptine -cyclosporine -certain medicines for blood pressure, heart disease, irregular heart beat -certain medicines for diabetes -quinidine -terfenadine This list may not describe all possible interactions. Give your health care provider a list of all the medicines, herbs, non-prescription drugs, or dietary supplements you use. Also tell them if you smoke, drink alcohol, or  use illegal drugs. Some items may interact with your medicine. What should I watch for while using this medicine? Tell your doctor or healthcare professional if your symptoms do not start to get better or if they get worse. Visit your doctor or health care professional for regular checks on your progress. Your condition will be monitored carefully while you are receiving this medicine. You may need blood work done while you are taking this medicine. Women should inform their doctor if they wish to become pregnant or think they might be pregnant. There is a potential for serious side effects to an unborn child. Talk to your health care professional or pharmacist for more information. Do not breast-feed an infant while taking this medicine or for 6 months after stopping it. This medicine has caused ovarian failure in some women. This medicine may interfere with the ability to have a child. Talk with your doctor or health care professional if you are concerned about your fertility. What side effects may I notice from receiving this medicine? Side effects that you should report to your doctor or health care professional as soon as possible: -allergic reactions like skin rash, itching or hives, swelling of the face, lips, or tongue -increased blood pressure -severe stomach pain -signs and symptoms of high blood sugar such as dizziness; dry mouth; dry skin; fruity breath; nausea; stomach pain; increased hunger or thirst; increased urination -signs and symptoms of low blood sugar such as feeling anxious; confusion; dizziness; increased hunger; unusually weak or tired; sweating; shakiness; cold; irritable; headache; blurred vision; fast heartbeat; loss of consciousness -unusually slow heartbeat Side effects that usually do not require medical attention (report to your doctor or health care professional if they continue   or are bothersome): -constipation -diarrhea -dizziness -headache -muscle pain -muscle  spasms -nausea -pain, redness, or irritation at site where injected This list may not describe all possible side effects. Call your doctor for medical advice about side effects. You may report side effects to FDA at 1-800-FDA-1088. Where should I keep my medicine? This drug is given in a hospital or clinic and will not be stored at home. NOTE: This sheet is a summary. It may not cover all possible information. If you have questions about this medicine, talk to your doctor, pharmacist, or health care provider.  2018 Elsevier/Gold Standard (2015-11-02 10:33:47)  

## 2017-08-20 LAB — CHROMOGRANIN A: CHROMOGRANIN A: 3 nmol/L (ref 0–5)

## 2017-08-21 ENCOUNTER — Ambulatory Visit (INDEPENDENT_AMBULATORY_CARE_PROVIDER_SITE_OTHER): Payer: 59 | Admitting: Family Medicine

## 2017-08-21 ENCOUNTER — Encounter: Payer: Self-pay | Admitting: Family Medicine

## 2017-08-21 VITALS — BP 130/82 | HR 92 | Temp 98.2°F | Ht 59.0 in | Wt 214.6 lb

## 2017-08-21 DIAGNOSIS — L600 Ingrowing nail: Secondary | ICD-10-CM

## 2017-08-21 MED ORDER — MUPIROCIN 2 % EX OINT: 1.0000 "application " | TOPICAL_OINTMENT | Freq: Two times a day (BID) | CUTANEOUS | 0 refills | Status: DC

## 2017-08-21 NOTE — Progress Notes (Signed)
Leah [name] is a 57 y.o. female here for an acute visit.  History of Present Illness:   [name], CMA acting as scribe for Dr. Briscoe Deutscher.   HPI: Patient started having some discomfort when walking on right big toe for about 4 weeks. No discharge from nail. Some redness and swelling. She has noticed that the nail has started turning in.   PMHx, SurgHx, SocialHx, Medications, and Allergies were reviewed in the Visit Navigator and updated as appropriate.  Current Medications:   Current Outpatient Medications:  .  Acyclovir-Hydrocortisone (XERESE) 5-1 % CREA, APPLY TO EFFECTED AREA, Disp: 5 g, Rfl: 0 .  amLODipine (NORVASC) 10 MG tablet, TAKE 1 TABLET BY MOUTH AT BEDTIME, Disp: 90 tablet, Rfl: 1 .  aspirin 81 MG EC tablet, TAKE 1 TABLET (81 MG TOTAL) BY MOUTH DAILY., Disp: 90 tablet, Rfl: 0 .  fexofenadine (ALLEGRA) 180 MG tablet, TAKE 1 TABLET (180 MG TOTAL) BY MOUTH EVERY MORNING., Disp: 90 tablet, Rfl: 2 .  fluticasone (FLONASE) 50 MCG/ACT nasal spray, USE 2 SPRAYS IN BOTH NOSTRILS EVERY DAY, Disp: 16 g, Rfl: 2 .  gabapentin (NEURONTIN) 100 MG capsule, Take 1 capsule (100 mg total) by mouth at bedtime., Disp: 90 capsule, Rfl: 3 .  ketoconazole (NIZORAL) 2 % cream, Apply 1 application topically 2 (two) times daily., Disp: 15 g, Rfl: 0 .  metoprolol succinate (TOPROL-XL) 100 MG 24 hr tablet, TAKE 1 TABLET BY MOUTH EVERY EVENING WITH OR IMMEDIATELY FOLLOWING A MEAL, Disp: 90 tablet, Rfl: 1 .  montelukast (SINGULAIR) 10 MG tablet, TAKE 1 TABLET BY MOUTH EVERYDAY AT BEDTIME, Disp: 90 tablet, Rfl: 0 .  octreotide (SANDOSTATIN LAR) 30 MG injection, Inject 30 mg into the muscle every 28 (twenty-eight) days., Disp: , Rfl:  .  ondansetron (ZOFRAN-ODT) 4 MG disintegrating tablet, DISSOLVE 1 TABLET BY MOUTH EVERY 8 HOURS AS NEEDED FOR NAUSEA AND VOMITING, Disp: 90 tablet, Rfl: 2 .  OSPHENA 60 MG TABS, TAKE ONE TABLET BY MOUTH DAILY, Disp: 90 tablet, Rfl: 3 .  oxyCODONE (ROXICODONE) 5  MG immediate release tablet, Take 1 tablet (5 mg total) by mouth every 4 (four) hours as needed for severe pain., Disp: 30 tablet, Rfl: 0 .  pantoprazole (PROTONIX) 40 MG tablet, TAKE 1 TABLET BY MOUTH TWICE A DAY, Disp: 180 tablet, Rfl: 0 .  PROAIR HFA 108 (90 Base) MCG/ACT inhaler, INHALE 2 PUFFS INTO THE LUNGS EVERY 6 (SIX) HOURS AS NEEDED. FOR SHORTNESS OF BREATH., Disp: 6.7 Inhaler, Rfl: 4 .  valACYclovir (VALTREX) 500 MG tablet, TAKE 1 TABLET (500 MG TOTAL) BY MOUTH 2 (TWO) TIMES DAILY AS NEEDED. COLD SORES, Disp: 30 tablet, Rfl: 0 .  valsartan (DIOVAN) 320 MG tablet, TAKE 1 TABLET (320 MG TOTAL) BY MOUTH DAILY., Disp: 90 tablet, Rfl: 1 .  venlafaxine XR (EFFEXOR-XR) 150 MG 24 hr capsule, TAKE 1 CAPSULE (150 MG TOTAL) BY MOUTH EACH MORNING, Disp: 90 capsule, Rfl: 1 .  mupirocin ointment (BACTROBAN) 2 %, Place 1 application into the nose 2 (two) times daily., Disp: 22 g, Rfl: 0 .  Phentermine-Topiramate 7.5-46 MG CP24, Take 7.5-46 mg by mouth daily., Disp: 90 capsule, Rfl: 0   No Known Allergies  Review of Systems:   Pertinent items are noted in the HPI. Otherwise, ROS is negative.  Vitals:   Vitals:   08/21/17 1147  BP: 130/82  Pulse: 92  Temp: 98.2 F (36.8 C)  TempSrc: Oral  SpO2: 94%  Weight: 214 lb 9.6 oz (97.3  kg)  Height: 4\' 11"  (1.499 m)     Body mass index is 43.34 kg/m.  Physical Exam:   Physical Exam  Skin:  Right great toe with medial edge ingrowing. Mild. No infection.      Assessment and Plan:   Velora was seen today for toe pain.  Diagnoses and all orders for this visit:  Ingrowing right great toenail Comments: Mild. Able to release using curved hemostat. She will soak and massage with bacitracin.  Other orders -     mupirocin ointment (BACTROBAN) 2 %; Place 1 application into the nose 2 (two) times daily.    . Reviewed expectations re: course of current medical issues. . Discussed self-management of symptoms. . Outlined signs and symptoms  indicating need for more acute intervention. . Patient verbalized understanding and all questions were answered. Marland Kitchen Health Maintenance issues including appropriate healthy diet, exercise, and smoking avoidance were discussed with patient. . See orders for this visit as documented in the electronic medical record. . Patient received an After Visit Summary.  CMA served as Education administrator during this visit. History, Physical, and Plan performed by medical provider. The above documentation has been reviewed and is accurate and complete. Briscoe Deutscher, D.O.  Briscoe Deutscher, DO [place], [place] [facility] 08/21/2017

## 2017-08-21 NOTE — Patient Instructions (Signed)
Soak your toe twice a day. Massage in the ointment.

## 2017-08-24 ENCOUNTER — Other Ambulatory Visit: Payer: Self-pay | Admitting: Family Medicine

## 2017-08-27 ENCOUNTER — Telehealth: Payer: Self-pay | Admitting: Family Medicine

## 2017-08-27 NOTE — Telephone Encounter (Signed)
Copied from [place] [id number]. Topic: Quick Communication - See Telephone Encounter >> Aug 27, 2017  3:40 PM Neva Seat wrote: Pt is having another yeast infection and pt is asking if another Rx could be called in for her.

## 2017-08-27 NOTE — Telephone Encounter (Signed)
See note

## 2017-08-28 MED ORDER — FLUCONAZOLE 150 MG PO TABS: ORAL_TABLET | ORAL | 0 refills | Status: DC

## 2017-08-28 NOTE — Telephone Encounter (Signed)
Prescription sent to the pharmacy. Notified patient.  

## 2017-08-28 NOTE — Telephone Encounter (Signed)
Do you want patient to be seen?

## 2017-08-28 NOTE — Telephone Encounter (Signed)
Okay diflucan (x 2).

## 2017-08-31 ENCOUNTER — Encounter: Payer: Self-pay | Admitting: Hematology & Oncology

## 2017-09-11 ENCOUNTER — Other Ambulatory Visit: Payer: Self-pay | Admitting: Family Medicine

## 2017-09-11 DIAGNOSIS — M4722 Other spondylosis with radiculopathy, cervical region: Secondary | ICD-10-CM

## 2017-09-11 DIAGNOSIS — I10 Essential (primary) hypertension: Secondary | ICD-10-CM

## 2017-09-15 ENCOUNTER — Inpatient Hospital Stay: Payer: 59 | Attending: Hematology & Oncology

## 2017-09-15 DIAGNOSIS — C7B8 Other secondary neuroendocrine tumors: Secondary | ICD-10-CM

## 2017-09-15 DIAGNOSIS — C7A00 Malignant carcinoid tumor of unspecified site: Secondary | ICD-10-CM | POA: Diagnosis not present

## 2017-09-15 DIAGNOSIS — C7B02 Secondary carcinoid tumors of liver: Secondary | ICD-10-CM | POA: Insufficient documentation

## 2017-09-15 MED ORDER — LANREOTIDE ACETATE 120 MG/0.5ML ~~LOC~~ SOLN
120.0000 mg | Freq: Once | SUBCUTANEOUS | Status: AC
  Administered 2017-09-15: 120 mg via SUBCUTANEOUS

## 2017-09-15 MED ORDER — LANREOTIDE ACETATE 120 MG/0.5ML ~~LOC~~ SOLN
SUBCUTANEOUS | Status: AC
  Filled 2017-09-15: qty 120

## 2017-10-05 ENCOUNTER — Ambulatory Visit: Payer: 59 | Admitting: Family Medicine

## 2017-10-13 ENCOUNTER — Inpatient Hospital Stay: Payer: 59 | Attending: Hematology & Oncology

## 2017-10-13 VITALS — BP 130/68 | HR 71 | Temp 98.4°F | Resp 20

## 2017-10-13 DIAGNOSIS — C7B02 Secondary carcinoid tumors of liver: Secondary | ICD-10-CM | POA: Insufficient documentation

## 2017-10-13 DIAGNOSIS — C7A00 Malignant carcinoid tumor of unspecified site: Secondary | ICD-10-CM | POA: Insufficient documentation

## 2017-10-13 DIAGNOSIS — C7B8 Other secondary neuroendocrine tumors: Secondary | ICD-10-CM

## 2017-10-13 DIAGNOSIS — M1712 Unilateral primary osteoarthritis, left knee: Secondary | ICD-10-CM | POA: Insufficient documentation

## 2017-10-13 MED ORDER — LANREOTIDE ACETATE 120 MG/0.5ML ~~LOC~~ SOLN
SUBCUTANEOUS | Status: AC
  Filled 2017-10-13: qty 120

## 2017-10-13 MED ORDER — LANREOTIDE ACETATE 120 MG/0.5ML ~~LOC~~ SOLN
120.0000 mg | Freq: Once | SUBCUTANEOUS | Status: AC
  Administered 2017-10-13: 120 mg via SUBCUTANEOUS

## 2017-10-20 ENCOUNTER — Encounter: Payer: Self-pay | Admitting: Cardiovascular Disease

## 2017-10-20 ENCOUNTER — Ambulatory Visit (INDEPENDENT_AMBULATORY_CARE_PROVIDER_SITE_OTHER): Payer: 59 | Admitting: Cardiovascular Disease

## 2017-10-20 VITALS — BP 124/72 | HR 72 | Ht 59.0 in | Wt 216.8 lb

## 2017-10-20 DIAGNOSIS — Z0000 Encounter for general adult medical examination without abnormal findings: Secondary | ICD-10-CM | POA: Diagnosis not present

## 2017-10-20 DIAGNOSIS — Z01818 Encounter for other preprocedural examination: Secondary | ICD-10-CM | POA: Insufficient documentation

## 2017-10-20 NOTE — Patient Instructions (Signed)
Medication Instructions:  Your physician recommends that you continue on your current medications as directed. Please refer to the Current Medication list given to you today.   Labwork: NONE  Testing/Procedures: NONE  Follow-Up: Follow up with DR BERRY as needed.   Any Other Special Instructions Will Be Listed Below (If Applicable).     If you need a refill on your cardiac medications before your next appointment, please call your pharmacy.   

## 2017-10-20 NOTE — Assessment & Plan Note (Signed)
Ms. Noa returns today for preoperative clearance before elective left total knee replacement scheduled for next month by Dr. Maureen Ralphs.  Only risk factors are hypertension.  She is never had a heart attack or stroke.  He denies chest pain or shortness of breath.  She is cleared at low risk without the need for functional study.  I will see her back as needed.

## 2017-10-20 NOTE — Progress Notes (Signed)
10/20/2017 Leah [name]   1960/10/31  [id number]  Primary Physician Briscoe Deutscher, DO Primary Cardiologist: Lorretta Harp MD Garret Reddish, [place], [place]  HPI:  Leah [name] is a 57 y.o.  moderately overweight very Caucasian female mother of 2 children referred by [place] nurse practitioner at [facility] for evaluation of palpitations.  She currently sees Dr. Briscoe Deutscher.  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 She status post partial hepatectomy at [facility] back in 2011. Her palms include hypertension, palpitations or orthostatic hypotension. Dr. Burney Gauze is her oncologist. She complains of palpitations her monthly basis or really temporally related to her carcinoid medication. This can occur for minutes or up to hours at a time. She has had orthostatic hypotension as well.  Since I saw her back 4 years ago she is remained stable.  Her carcinoid is under good control.  She gets occasional ablation of her liver mets.  Her blood pressures under control as well.  She denies chest pain or shortness of breath.  She is scheduled for elective left total knee replacement next month with Dr. Maureen Ralphs and was referred back for preoperative clearance.  Current Meds  Medication Sig  . Acyclovir-Hydrocortisone (XERESE) 5-1 % CREA APPLY TO EFFECTED AREA  . amLODipine (NORVASC) 10 MG tablet TAKE 1 TABLET BY MOUTH AT BEDTIME  . aspirin 81 MG EC tablet TAKE 1 TABLET (81 MG TOTAL) BY MOUTH DAILY.  . fexofenadine (ALLEGRA) 180 MG tablet TAKE 1 TABLET (180 MG TOTAL) BY MOUTH EVERY MORNING.  . fluconazole (DIFLUCAN) 150 MG tablet Take one tablet then three days later take another.  . fluticasone (FLONASE) 50 MCG/ACT nasal spray USE 2 SPRAYS IN BOTH NOSTRILS EVERY DAY  . gabapentin (NEURONTIN) 100 MG capsule Take 1 capsule (100 mg total) by mouth at bedtime.  Marland Kitchen ketoconazole (NIZORAL) 2 % cream Apply 1 application topically 2  (two) times daily.  . metoprolol succinate (TOPROL-XL) 100 MG 24 hr tablet TAKE 1 TABLET BY MOUTH EVERY EVENING WITH OR IMMEDIATELY FOLLOWING A MEAL  . montelukast (SINGULAIR) 10 MG tablet TAKE 1 TABLET BY MOUTH EVERYDAY AT BEDTIME  . mupirocin ointment (BACTROBAN) 2 % Place 1 application into the nose 2 (two) times daily.  . naproxen (NAPROSYN) 500 MG tablet TAKE 1 TABLET (500 MG TOTAL) BY MOUTH 2 (TWO) TIMES DAILY WITH A MEAL.  Marland Kitchen octreotide (SANDOSTATIN LAR) 30 MG injection Inject 30 mg into the muscle every 28 (twenty-eight) days.  . ondansetron (ZOFRAN-ODT) 4 MG disintegrating tablet DISSOLVE 1 TABLET BY MOUTH EVERY 8 HOURS AS NEEDED FOR NAUSEA AND VOMITING  . OSPHENA 60 MG TABS TAKE ONE TABLET BY MOUTH DAILY  . oxyCODONE (ROXICODONE) 5 MG immediate release tablet Take 1 tablet (5 mg total) by mouth every 4 (four) hours as needed for severe pain.  . pantoprazole (PROTONIX) 40 MG tablet TAKE 1 TABLET BY MOUTH TWICE A DAY  . PROAIR HFA 108 (90 Base) MCG/ACT inhaler INHALE 2 PUFFS INTO THE LUNGS EVERY 6 (SIX) HOURS AS NEEDED. FOR SHORTNESS OF BREATH.  . valACYclovir (VALTREX) 500 MG tablet TAKE 1 TABLET (500 MG TOTAL) BY MOUTH 2 (TWO) TIMES DAILY AS NEEDED. COLD SORES  . valsartan (DIOVAN) 320 MG tablet TAKE 1 TABLET (320 MG TOTAL) BY MOUTH DAILY.  Marland Kitchen venlafaxine XR (EFFEXOR-XR) 150 MG 24 hr capsule TAKE 1 CAPSULE (150 MG TOTAL) BY MOUTH EACH MORNING  No Known Allergies  Social History   Socioeconomic History  . Marital status: Married    Spouse name: Not on file  . Number of children: Not on file  . Years of education: Not on file  . Highest education level: Not on file  Occupational History  . Not on file  Social Needs  . Financial resource strain: Not on file  . Food insecurity:    Worry: Not on file    Inability: Not on file  . Transportation needs:    Medical: Not on file    Non-medical: Not on file  Tobacco Use  . Smoking status: Never Smoker  . Smokeless tobacco: Never  Used  . Tobacco comment: never used tobacco  Substance and Sexual Activity  . Alcohol use: No    Alcohol/week: 0.0 standard drinks  . Drug use: No  . Sexual activity: Yes    Partners: Male    Birth control/protection: Surgical    Comment: hysterectomy  Lifestyle  . Physical activity:    Days per week: Not on file    Minutes per session: Not on file  . Stress: Not on file  Relationships  . Social connections:    Talks on phone: Not on file    Gets together: Not on file    Attends religious service: Not on file    Active member of club or organization: Not on file    Attends meetings of clubs or organizations: Not on file    Relationship status: Not on file  . Intimate partner violence:    Fear of current or ex partner: Not on file    Emotionally abused: Not on file    Physically abused: Not on file    Forced sexual activity: Not on file  Other Topics Concern  . Not on file  Social History Narrative   Married to [place]   2 sons- 2001 Kasandra Knudsen and  alex (1999)   Has dog   She is unemployed, has worked in the past (office work)   Completed some college   Enjoys Theatre manager     Review of Systems: General: negative for chills, fever, night sweats or weight changes.  Cardiovascular: negative for chest pain, dyspnea on exertion, edema, orthopnea, palpitations, paroxysmal nocturnal dyspnea or shortness of breath Dermatological: negative for rash Respiratory: negative for cough or wheezing Urologic: negative for hematuria Abdominal: negative for nausea, vomiting, diarrhea, bright red blood per rectum, melena, or hematemesis Neurologic: negative for visual changes, syncope, or dizziness All other systems reviewed and are otherwise negative except as noted above.    Blood pressure 124/72, pulse 72, height 4\' 11"  (1.499 m), weight 216 lb 12.8 oz (98.3 kg).  General appearance: alert and no distress Neck: no adenopathy, no carotid bruit, no JVD, supple, symmetrical,  trachea midline and thyroid not enlarged, symmetric, no tenderness/mass/nodules Lungs: clear to auscultation bilaterally Heart: regular rate and rhythm, S1, S2 normal, no murmur, click, rub or gallop Extremities: extremities normal, atraumatic, no cyanosis or edema Pulses: 2+ and symmetric Skin: Skin color, texture, turgor normal. No rashes or lesions Neurologic: Alert and oriented X 3, normal strength and tone. Normal symmetric reflexes. Normal coordination and gait  EKG sinus rhythm at 72 with nonspecific ST and T wave changes.  Personally reviewed this EKG.  ASSESSMENT AND PLAN:   Preoperative clearance Ms. Towe returns today for preoperative clearance before elective left total knee replacement scheduled for next month by Dr. Maureen Ralphs.  Only risk factors are hypertension.  She is never had a heart attack or stroke.  He denies chest pain or shortness of breath.  She is cleared at low risk without the need for functional study.  I will see her back as needed.      Lorretta Harp MD FACP,FACC,FAHA, [facility] 10/20/2017 3:24 PM

## 2017-10-21 ENCOUNTER — Encounter: Payer: Self-pay | Admitting: Family Medicine

## 2017-10-21 ENCOUNTER — Ambulatory Visit: Payer: 59 | Admitting: Family Medicine

## 2017-10-21 ENCOUNTER — Telehealth: Payer: Self-pay | Admitting: Family Medicine

## 2017-10-21 DIAGNOSIS — Z0289 Encounter for other administrative examinations: Secondary | ICD-10-CM

## 2017-10-21 NOTE — Telephone Encounter (Signed)
Patient came in for her 7:40 am appointment at 7:54 am and stated "I know I am late." I explained that "yes, it looks like you are 14 min late for your appointment today but I can see If there are any openings later in the day." There were not any openings and I stated "Dr. Juleen China does not have anything today however, I can schedule you for Sept 23rd at 3:40 and that today would be a no show" Patient "agreed and stated that was fine." I wrote her appointment on a card and the patient stood for a while on her phone at the check in. She then went to sit down and stated she "was going to get some things together in her phone." I stated "alright and have a great day." Patient sat in the lobby for about 8-10 minutes as I checked in another patient.   She then came up front as I was finishing with the patient and approached Hailey to speak to her. Hailey stated "hold on just a moment" as she was not clocked in yet. The patient stated "she could wait for me or whomever finished first." I finished with the patient and stated " Mrs Napierkowski, was there something I can help you with?" She stated "Dr. Juleen China is going to pay half of my no show fee." I stated "Well, no ma'am the providers are not able to do that." She stated "yes she will because I have sent her 5 patients." I stated "Thank you for the patients however, that is not related to the no show fee and the providers are not able to pay the fee for you." I further exclaimed that "I would be happy to look into your chart (as I was doing so) and see if you have had any no show fees prior and if not then we could waive 1 out of courtesy." I stated " It looks like you do not have any fees so we will be happy to waive once you receive the statement in the mail." She stated that she "was going to send an email to Dr. Juleen China." I stated "sure you can, and I asked if she wanted [place] to waive the fee?" She did not respond and began walking away as another patient walked in. I said  "have a great day Mrs. Bora." She then stopped and said "if that's how you want to be then good for you!" with a thumbs up in the middle of the lobby. I said again "have a great day." And the patient continued to yell words (I could not tell what she was saying) as the doors closed, I proceeded to check in the next patient.  Awaiting patients call to waive the fee out of courtesy.

## 2017-10-21 NOTE — Progress Notes (Deleted)
Subjective:    Leah [name] is a 57 y.o. female who presents to the office today for a preoperative consultation at the request of surgeon Dr. Maureen Ralphs who plans on performing an elective total knee replacement on December 07, 2017. This consultation is requested for the specific conditions prompting preoperative evaluation (i.e. because of potential affect on operative risk): ***. Planned anesthesia is {anesthesia type:812}. The patient has the following known anesthesia issues: {anesthesia problems:16687}. Patient has a bleeding risk of: {bleeding risk:16688}. Patient does not have objections to receiving blood products if needed.  Recent visit with Cardiology: Her carcinoid is under good control.  She gets occasional ablation of her liver mets.  Her blood pressures under control as well.  She denies chest pain or shortness of breath. Ms. Leah [name] returns today for preoperative clearance before elective left total knee replacement scheduled for next month by Dr. Maureen Ralphs.  Only risk factors are hypertension.  She is never had a heart attack or stroke.  He denies chest pain or shortness of breath.  She is cleared at low risk without the need for functional study.  I will see her back as needed.  Patient Active Problem List   Diagnosis Date Noted  . Preoperative clearance 10/20/2017  . Atrophic vaginitis 04/12/2016  . Hepatic encephalopathy ([place]) 04/12/2016  . Osteopenia 09/27/2015  . Branch retinal vein occlusion of left eye 12/12/2014  . Hypertensive retinopathy of both eyes 12/12/2014  . Lung nodule seen on imaging study 05/01/2014  . Diabetes mellitus type 2, controlled ([place]) 03/17/2013  . Morbid obesity ([place]) 03/01/2013  . Carcinoid syndrome ([place]) 02/21/2013  . Retinopathy 01/18/2013  . 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 Malignant neoplasm of breast ([place]) 01/08/2011  . 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 Hypertension 10/22/2010  . GERD (gastroesophageal reflux  disease) 10/22/2010  . Allergic rhinitis 10/22/2010  . History of bilateral mastectomy 10/22/2010  . Sarcoidosis 10/22/2010  . Osteoarthritis 10/22/2010  . Anxiety 10/22/2010   Past Surgical History:  Procedure Laterality Date  . ABDOMINAL HYSTERECTOMY  2004  . BREAST BIOPSY Left   . CESAREAN SECTION  [id number]  . CHOLECYSTECTOMY  2006  . KNEE ARTHROSCOPY Left 09/2010  . LAPAROSCOPIC HEPATECTOMY    . MASTECTOMY Bilateral 2008  . OOPHORECTOMY  2004  . RECONSTRUCTION BREAST W/ LATISSIMUS DORSI FLAP Bilateral   . TONSILLECTOMY  1976  . TUBAL LIGATION Bilateral 2001   Social History   Socioeconomic History  . Marital status: Married    Spouse name: Vicente Serene  . Number of children: 2  . Years of education: Not on file  . Highest education level: Some college, no degree  Occupational History  . Occupation: Stay at [employer]  . Financial resource strain: Not very hard  . Food insecurity:    Worry: Never true    Inability: Never true  . Transportation needs:    Medical: No    Non-medical: No  Tobacco Use  . Smoking status: Never Smoker  . Smokeless tobacco: Never Used  . Tobacco comment: never used tobacco  Substance and Sexual Activity  . Alcohol use: No    Alcohol/week: 0.0 standard drinks  . Drug use: No  . Sexual activity: Yes    Partners: Male    Birth control/protection: Surgical    Comment: hysterectomy  Lifestyle  . Physical activity:    Days per week: 3 days    Minutes per session: 20 min  . Stress:  Only a little  Relationships  . Social connections:    Talks on phone: Not on file    Gets together: Not on file    Attends religious service: Not on file    Active member of club or organization: Not on file    Attends meetings of clubs or organizations: Not on file    Relationship status: Not on file  . Intimate partner violence:    Fear of current or ex partner: No    Emotionally abused: No    Physically abused: No    Forced sexual activity:  No  Other Topics Concern  . Not on file  Social History Narrative   Married to [place]. 2 sons (2001) Leah [name] and Leah [name] (1999). Has dog. 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   The following portions of the patient's history were reviewed and updated as appropriate: allergies, current medications, past family history, past medical history, past social history, past surgical history and problem list.  Review of Systems Pertinent items noted in HPI and remainder of comprehensive ROS otherwise negative.    Objective:   Physical Exam There were no vitals taken for this visit.  General Appearance:    Alert, cooperative, no distress, appears stated age  Head:    Normocephalic, without obvious abnormality, atraumatic  Eyes:    PERRL, conjunctiva/corneas clear, EOM's intact, fundi    benign, both eyes  Ears:    Normal TM's and external ear canals, both ears  Nose:   Nares normal, septum midline, mucosa normal, no drainage    or sinus tenderness  Throat:   Lips, mucosa, and tongue normal; teeth and gums normal  Neck:   Supple, symmetrical, trachea midline, no adenopathy;    thyroid:  no enlargement/tenderness/nodules; no carotid   bruit or JVD  Back:     Symmetric, no curvature, ROM normal, no CVA tenderness  Lungs:     Clear to auscultation bilaterally, respirations unlabored  Chest Wall:    No tenderness or deformity   Heart:    Regular rate and rhythm, S1 and S2 normal, no murmur, rub   or gallop  Breast Exam:    No tenderness, masses, or nipple abnormality  Abdomen:     Soft, non-tender, bowel sounds active all four quadrants,    no masses, no organomegaly  Genitalia:    Normal female without lesion, discharge or tenderness  Rectal:    Normal tone, normal prostate, no masses or tenderness;   guaiac negative stool  Extremities:   Extremities normal, atraumatic, no cyanosis or edema  Pulses:   2+ and symmetric all  extremities  Skin:   Skin color, texture, turgor normal, no rashes or lesions  Lymph nodes:   Cervical, supraclavicular, and axillary nodes normal  Neurologic:   CNII-XII intact, normal strength, sensation and reflexes    throughout   Imaging 12/31/17 CHEST CT: Stable 3 mm left lower lobe pulmonary nodule, consistent with benign etiology. No active lung disease.  Aortic Atherosclerosis (ICD10-I70.0). Coronary artery calcification.  Lab Review  Lab Results  Component Value Date   NA 143 08/18/2017   NA 144 05/26/2017   NA 135 05/05/2017   NA 140 01/20/2017   NA 144 12/19/2016   NA 144 11/19/2016   NA 141 01/17/2016   NA 139 02/27/2015   NA 142 11/23/2014   K 3.9 08/18/2017   K 4.3 05/26/2017   K 4.1 05/05/2017  K 3.9 01/20/2017   K 3.6 12/19/2016   K 3.7 11/19/2016   K 3.2 (L) 01/17/2016   K 3.9 02/27/2015   K 3.8 11/23/2014   CO2 25 08/18/2017   CO2 28 05/26/2017   CO2 21 05/05/2017   CO2 26 01/20/2017   CO2 27 12/19/2016   CO2 27 11/19/2016   CO2 27 01/17/2016   CO2 25 02/27/2015   CO2 24 11/23/2014   BUN 15 08/18/2017   BUN 19 05/26/2017   BUN 19 05/05/2017   BUN 15 01/20/2017   BUN 15 12/19/2016   BUN 15 11/19/2016   BUN 6.6 (L) 01/17/2016   BUN 13.2 02/27/2015   BUN 15.6 11/23/2014   CREATININE 0.80 08/18/2017   CREATININE 0.90 05/26/2017   CREATININE 0.70 05/05/2017   CREATININE 1.1 01/20/2017   CREATININE 0.8 12/19/2016   CREATININE 0.8 11/19/2016   CREATININE 0.7 01/17/2016   CREATININE 0.8 02/27/2015   CREATININE 0.7 11/23/2014   GLUCOSE 140 (H) 08/18/2017   GLUCOSE 141 (H) 05/26/2017   GLUCOSE 148 (H) 05/05/2017   GLUCOSE 159 (H) 01/20/2017   GLUCOSE 217 (H) 12/19/2016   GLUCOSE 146 (H) 11/19/2016   CALCIUM 8.7 08/18/2017   CALCIUM 9.3 05/26/2017   CALCIUM 9.1 05/05/2017   CALCIUM 9.1 01/20/2017   CALCIUM 9.0 12/19/2016   CALCIUM 9.4 11/19/2016   CALCIUM 8.7 01/17/2016   CALCIUM 9.2 02/27/2015   CALCIUM 8.9 11/23/2014   Lab  Results  Component Value Date   WBC 9.1 08/18/2017   WBC 10.8 (H) 05/26/2017   WBC 13.2 (H) 05/05/2017   WBC 11.1 (H) 01/20/2017   WBC 10.3 (H) 12/19/2016   WBC 12.4 (H) 11/19/2016   WBC 15.0 (H) 01/31/2016   WBC 10.8 (H) 09/19/2015   HGB 13.7 08/18/2017   HGB 13.7 05/26/2017   HGB 14.5 05/05/2017   HGB 13.1 01/20/2017   HGB 12.9 12/19/2016   HGB 13.3 11/19/2016   HCT 41.0 08/18/2017   HCT 41.9 05/26/2017   HCT 43.6 05/05/2017   HCT 39.8 01/20/2017   HCT 39.2 12/19/2016   HCT 40.4 11/19/2016   MCV 91.9 08/18/2017   MCV 93.7 05/26/2017   MCV 91.2 05/05/2017   MCV 92 01/20/2017   MCV 92 12/19/2016   MCV 94 11/19/2016   PLT 237 08/18/2017   PLT 280 05/26/2017   PLT 290.0 05/05/2017   PLT 282 01/20/2017   PLT 267 12/19/2016   PLT 284 11/19/2016   Lab Results  Component Value Date   CHOL 179 05/05/2017   CHOL 209 (H) 09/19/2015   CHOL 156 10/31/2014   TRIG 167.0 (H) 05/05/2017   TRIG 139.0 09/19/2015   TRIG 112.0 10/31/2014   HDL 58.40 05/05/2017   HDL 59.30 09/19/2015   HDL 46.50 10/31/2014      Assessment:   57 y.o. female with planned surgery as above.   Known risk factors for perioperative complications: Coronary disease     Plan:   1. Preoperative workup as follows {workup:16696}. 2. Change in medication regimen before surgery: {preop meds:16697}. 3. Prophylaxis for cardiac events with perioperative beta-blockers: {not indicated/consider:16698}. 4. Invasive hemodynamic monitoring perioperatively: {not indicated/strongly advise:16699}. 5. Deep vein thrombosis prophylaxis postoperatively:{not indicated/consider:16689}. 6. Surveillance for postoperative MI with ECG immediately postoperatively and on postoperative days 1 and 2 AND troponin levels 24 hours postoperatively and on day 4 or hospital discharge (whichever comes first): {not indicated/strongly advise:16699}. 7. Other measures: {preoperative recommendations:16691}   Briscoe Deutscher, D.O. [place],  [facility]

## 2017-10-23 ENCOUNTER — Encounter: Payer: Self-pay | Admitting: Family Medicine

## 2017-10-23 ENCOUNTER — Ambulatory Visit (INDEPENDENT_AMBULATORY_CARE_PROVIDER_SITE_OTHER): Payer: 59 | Admitting: Family Medicine

## 2017-10-23 VITALS — BP 126/70 | HR 90 | Temp 98.6°F | Ht 59.0 in | Wt 215.0 lb

## 2017-10-23 DIAGNOSIS — Z01818 Encounter for other preprocedural examination: Secondary | ICD-10-CM | POA: Diagnosis not present

## 2017-10-23 DIAGNOSIS — I10 Essential (primary) hypertension: Secondary | ICD-10-CM | POA: Diagnosis not present

## 2017-10-23 DIAGNOSIS — Z23 Encounter for immunization: Secondary | ICD-10-CM

## 2017-10-23 DIAGNOSIS — F419 Anxiety disorder, unspecified: Secondary | ICD-10-CM

## 2017-10-23 LAB — COMPREHENSIVE METABOLIC PANEL
ALT: 15 U/L (ref 0–35)
AST: 13 U/L (ref 0–37)
Albumin: 3.9 g/dL (ref 3.5–5.2)
Alkaline Phosphatase: 82 U/L (ref 39–117)
BUN: 23 mg/dL (ref 6–23)
CO2: 22 mEq/L (ref 19–32)
Calcium: 9 mg/dL (ref 8.4–10.5)
Chloride: 107 mEq/L (ref 96–112)
Creatinine, Ser: 0.78 mg/dL (ref 0.40–1.20)
GFR: 80.45 mL/min (ref >60.00)
Glucose, Bld: 117 mg/dL — ABNORMAL HIGH (ref 70–99)
Potassium: 4.9 mEq/L (ref 3.5–5.1)
Sodium: 139 mEq/L (ref 135–145)
Total Bilirubin: 0.4 mg/dL (ref 0.2–1.2)
Total Protein: 7 g/dL (ref 6.0–8.3)

## 2017-10-23 LAB — CBC WITH DIFFERENTIAL/PLATELET
Basophils Absolute: 0 10*3/uL (ref 0.0–0.1)
Basophils Relative: 0.4 % (ref 0.0–3.0)
Eosinophils Absolute: 0.2 10*3/uL (ref 0.0–0.7)
Eosinophils Relative: 2 % (ref 0.0–5.0)
HCT: 39.8 % (ref 36.0–46.0)
Hemoglobin: 13.2 g/dL (ref 12.0–15.0)
Lymphocytes Relative: 31.8 % (ref 12.0–46.0)
Lymphs Abs: 3.1 10*3/uL (ref 0.7–4.0)
MCHC: 33.2 g/dL (ref 30.0–36.0)
MCV: 91 fl (ref 78.0–100.0)
Monocytes Absolute: 0.6 10*3/uL (ref 0.1–1.0)
Monocytes Relative: 6.3 % (ref 3.0–12.0)
Neutro Abs: 5.7 10*3/uL (ref 1.4–7.7)
Neutrophils Relative %: 59.5 % (ref 43.0–77.0)
Platelets: 272 10*3/uL (ref 150.0–400.0)
RBC: 4.37 Mil/uL (ref 3.87–5.11)
RDW: 13.4 % (ref 11.5–15.5)
WBC: 9.6 10*3/uL (ref 4.0–10.5)

## 2017-10-23 MED ORDER — CLONAZEPAM 0.5 MG PO TABS: 0.5000 mg | ORAL_TABLET | Freq: Two times a day (BID) | ORAL | 1 refills | Status: DC | PRN

## 2017-10-23 NOTE — Progress Notes (Signed)
Subjective:    Leah [name] is a 57 y.o. female who presents to the office today for a preoperative consultation at the request of surgeon Dr. Maureen Ralphs who plans on performing a left total knee replacement on 12/07/17. The patient has the following known anesthesia issues: NONE. Patient has a bleeding risk of: no recent abnormal bleeding. Patient does not have objections to receiving blood products if needed.  Patient Active Problem List   Diagnosis Date Noted  . Preoperative clearance 10/20/2017  . Atrophic vaginitis 04/12/2016  . Hepatic encephalopathy ([place]) 04/12/2016  . Osteopenia 09/27/2015  . Branch retinal vein occlusion of left eye 12/12/2014  . Hypertensive retinopathy of both eyes 12/12/2014  . Lung nodule seen on imaging study 05/01/2014  . Diabetes mellitus type 2, controlled ([place]) 03/17/2013  . Morbid obesity ([place]) 03/01/2013  . Carcinoid syndrome ([place]) 02/21/2013  . Retinopathy 01/18/2013  . 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 Malignant neoplasm of breast ([place]) 01/08/2011  . 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 Hypertension 10/22/2010  . GERD (gastroesophageal reflux disease) 10/22/2010  . Allergic rhinitis 10/22/2010  . History of bilateral mastectomy 10/22/2010  . Sarcoidosis 10/22/2010  . Osteoarthritis 10/22/2010  . Anxiety 10/22/2010   Past Surgical History:  Procedure Laterality Date  . ABDOMINAL HYSTERECTOMY  2004  . BREAST BIOPSY Left   . CESAREAN SECTION  [id number]  . CHOLECYSTECTOMY  2006  . KNEE ARTHROSCOPY Left 09/2010  . LAPAROSCOPIC HEPATECTOMY    . MASTECTOMY Bilateral 2008  . OOPHORECTOMY  2004  . RECONSTRUCTION BREAST W/ LATISSIMUS DORSI FLAP Bilateral   . TONSILLECTOMY  1976  . TUBAL LIGATION Bilateral 2001   Social History   Socioeconomic History  . Marital status: Married    Spouse name: Vicente Serene  . Number of children: 2  . Years of education: Not on file  . Highest education level: Some college, no degree   Occupational History  . Occupation: Stay at [employer]  . Financial resource strain: Not very hard  . Food insecurity:    Worry: Never true    Inability: Never true  . Transportation needs:    Medical: No    Non-medical: No  Tobacco Use  . Smoking status: Never Smoker  . Smokeless tobacco: Never Used  . Tobacco comment: never used tobacco  Substance and Sexual Activity  . Alcohol use: No    Alcohol/week: 0.0 standard drinks  . Drug use: No  . Sexual activity: Yes    Partners: Male    Birth control/protection: Surgical    Comment: hysterectomy  Lifestyle  . Physical activity:    Days per week: 3 days    Minutes per session: 20 min  . Stress: Only a little  Relationships  . Social connections:    Talks on phone: Not on file    Gets together: Not on file    Attends religious service: Not on file    Active member of club or organization: Not on file    Attends meetings of clubs or organizations: Not on file    Relationship status: Not on file  . Intimate partner violence:    Fear of current or ex partner: No    Emotionally abused: No    Physically abused: No    Forced sexual activity: No  Other Topics Concern  . Not on file  Social History Narrative   Married to [place]. 2 sons (2001) Kasandra Knudsen and Cristie Hem (1999). Has dog. 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    The following portions of the patient's history were reviewed and updated as appropriate: allergies, current medications, past family history, past medical history, past social history, past surgical history and problem list.  Review of Systems Pertinent items noted in HPI and remainder of comprehensive ROS otherwise negative.    Objective:   Physical Exam BP 126/70   Pulse 90   Temp 98.6 F (37 C) (Oral)   Ht 4\' 11"  (1.499 m)   Wt 215 lb (97.5 kg)   SpO2 96%   BMI 43.42 kg/m    General appearance: alert, cooperative and  appears stated age Head: Normocephalic, without obvious abnormality, atraumatic Eyes: conjunctivae/corneas clear. PERRL, EOM's intact. Fundi benign. Ears: normal TM's and external ear canals both ears Nose: Nares normal. Septum midline. Mucosa normal. No drainage or sinus tenderness. Throat: lips, mucosa, and tongue normal; teeth and gums normal Lungs: clear to auscultation bilaterally Heart: regular rate and rhythm, S1, S2 normal, no murmur, click, rub or gallop Abdomen: soft, non-tender; bowel sounds normal; no masses,  no organomegaly Extremities: extremities normal, atraumatic, no cyanosis or edema Pulses: 2+ and symmetric Skin: Skin color, texture, turgor normal. No rashes or lesions Lymph nodes: Cervical, supraclavicular, and axillary nodes normal. Neurologic: Alert and oriented X 3, normal strength and tone. Normal symmetric reflexes. Normal coordination and gait  Imaging Chest CT (12/31/16): Stable 3 mm left lower lobe pulmonary nodule, consistent with benign etiology. No active lung disease.  Lab Review  Lab Results  Component Value Date   NA 143 08/18/2017   NA 144 05/26/2017   NA 135 05/05/2017   NA 140 01/20/2017   NA 144 12/19/2016   NA 144 11/19/2016   NA 141 01/17/2016   NA 139 02/27/2015   NA 142 11/23/2014   K 3.9 08/18/2017   K 4.3 05/26/2017   K 4.1 05/05/2017   K 3.9 01/20/2017   K 3.6 12/19/2016   K 3.7 11/19/2016   K 3.2 (L) 01/17/2016   K 3.9 02/27/2015   K 3.8 11/23/2014   CO2 25 08/18/2017   CO2 28 05/26/2017   CO2 21 05/05/2017   CO2 26 01/20/2017   CO2 27 12/19/2016   CO2 27 11/19/2016   CO2 27 01/17/2016   CO2 25 02/27/2015   CO2 24 11/23/2014   BUN 15 08/18/2017   BUN 19 05/26/2017   BUN 19 05/05/2017   BUN 15 01/20/2017   BUN 15 12/19/2016   BUN 15 11/19/2016   BUN 6.6 (L) 01/17/2016   BUN 13.2 02/27/2015   BUN 15.6 11/23/2014   CREATININE 0.80 08/18/2017   CREATININE 0.90 05/26/2017   CREATININE 0.70 05/05/2017   CREATININE  1.1 01/20/2017   CREATININE 0.8 12/19/2016   CREATININE 0.8 11/19/2016   CREATININE 0.7 01/17/2016   CREATININE 0.8 02/27/2015   CREATININE 0.7 11/23/2014   GLUCOSE 140 (H) 08/18/2017   GLUCOSE 141 (H) 05/26/2017   GLUCOSE 148 (H) 05/05/2017   GLUCOSE 159 (H) 01/20/2017   GLUCOSE 217 (H) 12/19/2016   GLUCOSE 146 (H) 11/19/2016   CALCIUM 8.7 08/18/2017   CALCIUM 9.3 05/26/2017   CALCIUM 9.1 05/05/2017   CALCIUM 9.1 01/20/2017   CALCIUM 9.0 12/19/2016   CALCIUM 9.4 11/19/2016   CALCIUM 8.7 01/17/2016   CALCIUM 9.2 02/27/2015   CALCIUM 8.9 11/23/2014   Lab Results  Component Value Date   WBC 9.1 08/18/2017  WBC 10.8 (H) 05/26/2017   WBC 13.2 (H) 05/05/2017   WBC 11.1 (H) 01/20/2017   WBC 10.3 (H) 12/19/2016   WBC 12.4 (H) 11/19/2016   WBC 15.0 (H) 01/31/2016   WBC 10.8 (H) 09/19/2015   HGB 13.7 08/18/2017   HGB 13.7 05/26/2017   HGB 14.5 05/05/2017   HGB 13.1 01/20/2017   HGB 12.9 12/19/2016   HGB 13.3 11/19/2016   HCT 41.0 08/18/2017   HCT 41.9 05/26/2017   HCT 43.6 05/05/2017   HCT 39.8 01/20/2017   HCT 39.2 12/19/2016   HCT 40.4 11/19/2016   MCV 91.9 08/18/2017   MCV 93.7 05/26/2017   MCV 91.2 05/05/2017   MCV 92 01/20/2017   MCV 92 12/19/2016   MCV 94 11/19/2016   PLT 237 08/18/2017   PLT 280 05/26/2017   PLT 290.0 05/05/2017   PLT 282 01/20/2017   PLT 267 12/19/2016   PLT 284 11/19/2016   Lab Results  Component Value Date   CHOL 179 05/05/2017   CHOL 209 (H) 09/19/2015   CHOL 156 10/31/2014   TRIG 167.0 (H) 05/05/2017   TRIG 139.0 09/19/2015   TRIG 112.0 10/31/2014   HDL 58.40 05/05/2017   HDL 59.30 09/19/2015   HDL 46.50 10/31/2014    Assessment:   57 y.o. female with planned surgery as above.   Known risk factors for perioperative complications: None     Plan:   1. Preoperative workup as follows hemoglobin, hematocrit, electrolytes, creatinine, glucose, liver function studies. 2. Change in medication regimen before surgery: Hold ASA  for 5 days prior to surgery. 3. Low risk for surgery.  Briscoe Deutscher, DO

## 2017-10-23 NOTE — Progress Notes (Signed)
Leah [name] is a 57 y.o. female here for an acute visit.  History of Present Illness:   HPI: Patient in for surgical clearance on left knee. She has been evaluated by Cardiology. She also is having trouble with increased anxiety due to things going on in home. She is taking Effexor for several years and has helped a lot. She has noticed increased symptoms in the last month.     Office Visit from 10/23/2017 in [facility]  PHQ-9 Total Score  13     GAD 7 : Generalized Anxiety Score 10/23/2017  Nervous, Anxious, on Edge 2  Control/stop worrying 3  Worry too much - different things 3  Trouble relaxing 3  Restless 1  Easily annoyed or irritable 2  Afraid - awful might happen 2  Total GAD 7 Score 16   PMHx, SurgHx, SocialHx, Medications, and Allergies were reviewed in the Visit Navigator and updated as appropriate.  Current Medications:   Current Outpatient Medications:  .  Acyclovir-Hydrocortisone (XERESE) 5-1 % CREA, APPLY TO EFFECTED AREA, Disp: 5 g, Rfl: 0 .  amLODipine (NORVASC) 10 MG tablet, TAKE 1 TABLET BY MOUTH AT BEDTIME, Disp: 90 tablet, Rfl: 1 .  aspirin 81 MG EC tablet, TAKE 1 TABLET (81 MG TOTAL) BY MOUTH DAILY., Disp: 90 tablet, Rfl: 0 .  fexofenadine (ALLEGRA) 180 MG tablet, TAKE 1 TABLET (180 MG TOTAL) BY MOUTH EVERY MORNING., Disp: 90 tablet, Rfl: 2 .  fluconazole (DIFLUCAN) 150 MG tablet, Take one tablet then three days later take another., Disp: 2 tablet, Rfl: 0 .  fluticasone (FLONASE) 50 MCG/ACT nasal spray, USE 2 SPRAYS IN BOTH NOSTRILS EVERY DAY, Disp: 16 g, Rfl: 2 .  gabapentin (NEURONTIN) 100 MG capsule, Take 1 capsule (100 mg total) by mouth at bedtime., Disp: 90 capsule, Rfl: 3 .  ketoconazole (NIZORAL) 2 % cream, Apply 1 application topically 2 (two) times daily., Disp: 15 g, Rfl: 0 .  metoprolol succinate (TOPROL-XL) 100 MG 24 hr tablet, TAKE 1 TABLET BY MOUTH EVERY EVENING WITH OR IMMEDIATELY FOLLOWING A MEAL, Disp: 90 tablet,  Rfl: 1 .  montelukast (SINGULAIR) 10 MG tablet, TAKE 1 TABLET BY MOUTH EVERYDAY AT BEDTIME, Disp: 90 tablet, Rfl: 0 .  mupirocin ointment (BACTROBAN) 2 %, Place 1 application into the nose 2 (two) times daily., Disp: 22 g, Rfl: 0 .  naproxen (NAPROSYN) 500 MG tablet, TAKE 1 TABLET (500 MG TOTAL) BY MOUTH 2 (TWO) TIMES DAILY WITH A MEAL., Disp: 180 tablet, Rfl: 2 .  octreotide (SANDOSTATIN LAR) 30 MG injection, Inject 30 mg into the muscle every 28 (twenty-eight) days., Disp: , Rfl:  .  ondansetron (ZOFRAN-ODT) 4 MG disintegrating tablet, DISSOLVE 1 TABLET BY MOUTH EVERY 8 HOURS AS NEEDED FOR NAUSEA AND VOMITING, Disp: 90 tablet, Rfl: 2 .  OSPHENA 60 MG TABS, TAKE ONE TABLET BY MOUTH DAILY, Disp: 90 tablet, Rfl: 3 .  oxyCODONE (ROXICODONE) 5 MG immediate release tablet, Take 1 tablet (5 mg total) by mouth every 4 (four) hours as needed for severe pain., Disp: 30 tablet, Rfl: 0 .  pantoprazole (PROTONIX) 40 MG tablet, TAKE 1 TABLET BY MOUTH TWICE A DAY, Disp: 180 tablet, Rfl: 0 .  Phentermine-Topiramate 7.5-46 MG CP24, Take 7.5-46 mg by mouth daily., Disp: 90 capsule, Rfl: 0 .  PROAIR HFA 108 (90 Base) MCG/ACT inhaler, INHALE 2 PUFFS INTO THE LUNGS EVERY 6 (SIX) HOURS AS NEEDED. FOR SHORTNESS OF BREATH., Disp: 6.7 Inhaler, Rfl: 4 .  valACYclovir (  VALTREX) 500 MG tablet, TAKE 1 TABLET (500 MG TOTAL) BY MOUTH 2 (TWO) TIMES DAILY AS NEEDED. COLD SORES, Disp: 30 tablet, Rfl: 0 .  valsartan (DIOVAN) 320 MG tablet, TAKE 1 TABLET (320 MG TOTAL) BY MOUTH DAILY., Disp: 90 tablet, Rfl: 1 .  venlafaxine XR (EFFEXOR-XR) 150 MG 24 hr capsule, TAKE 1 CAPSULE (150 MG TOTAL) BY MOUTH EACH MORNING, Disp: 90 capsule, Rfl: 1   No Known Allergies   Review of Systems:   Pertinent items are noted in the HPI. Otherwise, ROS is negative.  Vitals:   Vitals:   10/23/17 0847  BP: 126/70  Pulse: 90  Temp: 98.6 F (37 C)  TempSrc: Oral  SpO2: 96%  Weight: 215 lb (97.5 kg)  Height: 4\' 11"  (1.499 m)     Body mass  index is 43.42 kg/m.  Physical Exam:   Physical Exam  Constitutional: She appears well-developed and well-nourished.  Cardiovascular: Normal rate and regular rhythm.  Neurological: She is alert.  Psychiatric: She has a normal mood and affect. Her behavior is normal.  Nursing note and vitals reviewed.  Assessment and Plan:   Allix was seen today for follow-up and anxiety.  Diagnoses and all orders for this visit:  Essential hypertension -     CBC with Differential/Platelet -     Comprehensive metabolic panel  Need for immunization against influenza -     Flu Vaccine QUAD 36+ mos IM  Anxiety -     clonazePAM (KLONOPIN) 0.5 MG tablet; Take 1 tablet (0.5 mg total) by mouth 2 (two) times daily as needed for anxiety.   . Reviewed expectations re: course of current medical issues. . Discussed self-management of symptoms. . Outlined signs and symptoms indicating need for more acute intervention. . Patient verbalized understanding and all questions were answered. Marland [facility] Health Maintenance issues including appropriate healthy diet, exercise, and smoking avoidance were discussed with patient. . See orders for this visit as documented in the electronic medical record. . Patient received an After Visit Summary.  Briscoe Deutscher, DO [place], [facility] 10/23/2017

## 2017-10-23 NOTE — Progress Notes (Deleted)
Leah [name] is a 57 y.o. female is here for follow up.  History of Present Illness:   Leah [name], CMA acting as scribe for Dr. Briscoe Deutscher.    HPI: Patient in for surgical clearance on left knee. She has been evaluated by Cardiology. She also is having trouble with increased anxiety due to things going on in home. She is taking Effexor for several years and has helped a lot. She has noticed increased symptoms in the last month.   Health Maintenance Due  Topic Date Due  . HEMOGLOBIN A1C  07/29/2017  . INFLUENZA VACCINE  09/10/2017   Depression screen [facility] 2/9 05/05/2017 12/15/2016 05/03/2013  Decreased Interest 0 0 0  Down, Depressed, Hopeless 0 0 0  PHQ - 2 Score 0 0 0  Altered sleeping 0 - -  Tired, decreased energy 0 - -  Change in appetite 0 - -  Feeling bad or failure about yourself  0 - -  Trouble concentrating 0 - -  Moving slowly or fidgety/restless 0 - -  Suicidal thoughts 0 - -  PHQ-9 Score 0 - -  Difficult doing work/chores Not difficult at all - -  Some recent data might be hidden   PMHx, SurgHx, SocialHx, FamHx, Medications, and Allergies were reviewed in the Visit Navigator and updated as appropriate.   Patient Active Problem List   Diagnosis Date Noted  . Preoperative clearance 10/20/2017  . Atrophic vaginitis 04/12/2016  . Hepatic encephalopathy ([place]) 04/12/2016  . Osteopenia 09/27/2015  . Branch retinal vein occlusion of left eye 12/12/2014  . Hypertensive retinopathy of both eyes 12/12/2014  . Lung nodule seen on imaging study 05/01/2014  . Diabetes mellitus type 2, controlled ([place]) 03/17/2013  . Morbid obesity ([place]) 03/01/2013  . Carcinoid syndrome ([place]) 02/21/2013  . Retinopathy 01/18/2013  . 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 Malignant neoplasm of breast ([place]) 01/08/2011  . 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 Hypertension 10/22/2010  . GERD (gastroesophageal reflux disease) 10/22/2010  . Allergic rhinitis  10/22/2010  . History of bilateral mastectomy 10/22/2010  . Sarcoidosis 10/22/2010  . Osteoarthritis 10/22/2010  . Anxiety 10/22/2010   Social History   Tobacco Use  . Smoking status: Never Smoker  . Smokeless tobacco: Never Used  . Tobacco comment: never used tobacco  Substance Use Topics  . Alcohol use: No    Alcohol/week: 0.0 standard drinks  . Drug use: No   Current Medications and Allergies:   Current Outpatient Medications:  .  Acyclovir-Hydrocortisone (XERESE) 5-1 % CREA, APPLY TO EFFECTED AREA, Disp: 5 g, Rfl: 0 .  amLODipine (NORVASC) 10 MG tablet, TAKE 1 TABLET BY MOUTH AT BEDTIME, Disp: 90 tablet, Rfl: 1 .  aspirin 81 MG EC tablet, TAKE 1 TABLET (81 MG TOTAL) BY MOUTH DAILY., Disp: 90 tablet, Rfl: 0 .  fexofenadine (ALLEGRA) 180 MG tablet, TAKE 1 TABLET (180 MG TOTAL) BY MOUTH EVERY MORNING., Disp: 90 tablet, Rfl: 2 .  fluticasone (FLONASE) 50 MCG/ACT nasal spray, USE 2 SPRAYS IN BOTH NOSTRILS EVERY DAY, Disp: 16 g, Rfl: 2 .  gabapentin (NEURONTIN) 100 MG capsule, Take 1 capsule (100 mg total) by mouth at bedtime., Disp: 90 capsule, Rfl: 3 .  ketoconazole (NIZORAL) 2 % cream, Apply 1 application topically 2 (two) times daily., Disp: 15 g, Rfl: 0 .  metoprolol succinate (TOPROL-XL) 100 MG 24 hr tablet, TAKE 1 TABLET BY MOUTH EVERY EVENING WITH OR IMMEDIATELY FOLLOWING A MEAL, Disp: 90 tablet, Rfl:  1 .  montelukast (SINGULAIR) 10 MG tablet, TAKE 1 TABLET BY MOUTH EVERYDAY AT BEDTIME, Disp: 90 tablet, Rfl: 0 .  mupirocin ointment (BACTROBAN) 2 %, Place 1 application into the nose 2 (two) times daily., Disp: 22 g, Rfl: 0 .  naproxen (NAPROSYN) 500 MG tablet, TAKE 1 TABLET (500 MG TOTAL) BY MOUTH 2 (TWO) TIMES DAILY WITH A MEAL., Disp: 180 tablet, Rfl: 2 .  octreotide (SANDOSTATIN LAR) 30 MG injection, Inject 30 mg into the muscle every 28 (twenty-eight) days., Disp: , Rfl:  .  ondansetron (ZOFRAN-ODT) 4 MG disintegrating tablet, DISSOLVE 1 TABLET BY MOUTH EVERY 8 HOURS AS  NEEDED FOR NAUSEA AND VOMITING, Disp: 90 tablet, Rfl: 2 .  OSPHENA 60 MG TABS, TAKE ONE TABLET BY MOUTH DAILY, Disp: 90 tablet, Rfl: 3 .  pantoprazole (PROTONIX) 40 MG tablet, TAKE 1 TABLET BY MOUTH TWICE A DAY, Disp: 180 tablet, Rfl: 0 .  Phentermine-Topiramate 7.5-46 MG CP24, Take 7.5-46 mg by mouth daily., Disp: 90 capsule, Rfl: 0 .  PROAIR HFA 108 (90 Base) MCG/ACT inhaler, INHALE 2 PUFFS INTO THE LUNGS EVERY 6 (SIX) HOURS AS NEEDED. FOR SHORTNESS OF BREATH., Disp: 6.7 Inhaler, Rfl: 4 .  valACYclovir (VALTREX) 500 MG tablet, TAKE 1 TABLET (500 MG TOTAL) BY MOUTH 2 (TWO) TIMES DAILY AS NEEDED. COLD SORES, Disp: 30 tablet, Rfl: 0 .  valsartan (DIOVAN) 320 MG tablet, TAKE 1 TABLET (320 MG TOTAL) BY MOUTH DAILY., Disp: 90 tablet, Rfl: 1 .  venlafaxine XR (EFFEXOR-XR) 150 MG 24 hr capsule, TAKE 1 CAPSULE (150 MG TOTAL) BY MOUTH EACH MORNING, Disp: 90 capsule, Rfl: 1  No Known Allergies Review of Systems   Pertinent items are noted in the HPI. Otherwise, ROS is negative.  Vitals:   Vitals:   10/23/17 0847  BP: 126/70  Pulse: 90  Temp: 98.6 F (37 C)  TempSrc: Oral  SpO2: 96%  Weight: 215 lb (97.5 kg)  Height: 4\' 11"  (1.499 m)     Body mass index is 43.42 kg/m.  Physical Exam:   Physical Exam  Results for orders placed or performed in visit on 08/18/17  CBC with Differential (Cancer Center Only)  Result Value Ref Range   WBC Count 9.1 3.9 - 10.0 K/uL   RBC 4.46 3.70 - 5.32 MIL/uL   Hemoglobin 13.7 11.6 - 15.9 g/dL   HCT 41.0 34.8 - 46.6 %   MCV 91.9 81.0 - 101.0 fL   MCH 30.7 26.0 - 34.0 pg   MCHC 33.4 32.0 - 36.0 g/dL   RDW 13.4 11.1 - 15.7 %   Platelet Count 237 145 - 400 K/uL   Neutrophils Relative % 60 %   Neutro Abs 5.4 1.5 - 6.5 K/uL   Lymphocytes Relative 31 %   Lymphs Abs 2.8 0.9 - 3.3 K/uL   Monocytes Relative 7 %   Monocytes Absolute 0.6 0.1 - 0.9 K/uL   Eosinophils Relative 2 %   Eosinophils Absolute 0.2 0.0 - 0.5 K/uL   Basophils Relative 0 %    Basophils Absolute 0.0 0.0 - 0.1 K/uL  Chromogranin A  Result Value Ref Range   Chromogranin A 3 0 - 5 nmol/L  CMP (Cancer Center only)  Result Value Ref Range   Sodium 143 128 - 145 mmol/L   Potassium 3.9 3.3 - 4.7 mmol/L   Chloride 107 98 - 108 mmol/L   CO2 25 18 - 33 mmol/L   Glucose, Bld 140 (H) 73 - 118 mg/dL   BUN  15 7 - 22 mg/dL   Creatinine 0.80 0.60 - 1.20 mg/dL   Calcium 8.7 8.0 - 10.3 mg/dL   Total Protein 7.0 6.4 - 8.1 g/dL   Albumin 3.3 (L) 3.5 - 5.0 g/dL   AST 28 11 - 38 U/L   ALT 29 10 - 47 U/L   Alkaline Phosphatase 89 (H) 26 - 84 U/L   Total Bilirubin 0.6 0.2 - 1.6 mg/dL   Anion gap 11 5 - 15  Lactate dehydrogenase  Result Value Ref Range   LDH 218 (H) 98 - 192 U/L    Assessment and Plan:   There are no diagnoses linked to this encounter.  . Reviewed expectations re: course of current medical issues. . Discussed self-management of symptoms. . Outlined signs and symptoms indicating need for more acute intervention. . Patient verbalized understanding and all questions were answered. Marland Kitchen Health Maintenance issues including appropriate healthy diet, exercise, and smoking avoidance were discussed with patient. . See orders for this visit as documented in the electronic medical record. . Patient received an After Visit Summary.  *** CMA served as Education administrator during this visit. History, Physical, and Plan performed by medical provider. The above documentation has been reviewed and is accurate and complete. Briscoe Deutscher, [place], [place], [place] [facility] 10/23/2017

## 2017-10-26 ENCOUNTER — Other Ambulatory Visit: Payer: Self-pay | Admitting: Family Medicine

## 2017-10-26 DIAGNOSIS — J301 Allergic rhinitis due to pollen: Secondary | ICD-10-CM

## 2017-10-27 ENCOUNTER — Other Ambulatory Visit: Payer: Self-pay | Admitting: Family Medicine

## 2017-11-02 ENCOUNTER — Ambulatory Visit: Payer: 59 | Admitting: Family Medicine

## 2017-11-04 DIAGNOSIS — Z9013 Acquired absence of bilateral breasts and nipples: Secondary | ICD-10-CM | POA: Insufficient documentation

## 2017-11-04 NOTE — Assessment & Plan Note (Addendum)
Overall, patient is doing well with no suspicious symptoms today. Her most recent MRI liver in July 2019 showed overall minimal change in the known liver lesion, consistent w/ disease recurrence. Referral to interventional radiology was discussed during the visit in July, 2019 but the decision was to defer IR and repeat MRI liver in 6 months. Her next follow-up appt w/ surgical oncology at [facility] is around December 2019.  Continue monthly lanreotide injection.

## 2017-11-06 ENCOUNTER — Other Ambulatory Visit: Payer: Self-pay

## 2017-11-06 ENCOUNTER — Inpatient Hospital Stay: Payer: 59

## 2017-11-06 ENCOUNTER — Inpatient Hospital Stay (HOSPITAL_BASED_OUTPATIENT_CLINIC_OR_DEPARTMENT_OTHER): Payer: 59 | Admitting: Hematology

## 2017-11-06 ENCOUNTER — Encounter: Payer: Self-pay | Admitting: Hematology

## 2017-11-06 ENCOUNTER — Other Ambulatory Visit: Payer: Self-pay | Admitting: Hematology

## 2017-11-06 VITALS — BP 134/75 | HR 74 | Temp 99.1°F | Resp 16 | Wt 214.0 lb

## 2017-11-06 DIAGNOSIS — Z9013 Acquired absence of bilateral breasts and nipples: Secondary | ICD-10-CM

## 2017-11-06 DIAGNOSIS — C7B8 Other secondary neuroendocrine tumors: Secondary | ICD-10-CM

## 2017-11-06 DIAGNOSIS — M1712 Unilateral primary osteoarthritis, left knee: Secondary | ICD-10-CM | POA: Diagnosis not present

## 2017-11-06 DIAGNOSIS — C7A00 Malignant carcinoid tumor of unspecified site: Secondary | ICD-10-CM

## 2017-11-06 DIAGNOSIS — C7B02 Secondary carcinoid tumors of liver: Secondary | ICD-10-CM

## 2017-11-06 DIAGNOSIS — R911 Solitary pulmonary nodule: Secondary | ICD-10-CM

## 2017-11-06 LAB — CMP (CANCER CENTER ONLY)
ALBUMIN: 3.6 g/dL (ref 3.5–5.0)
ALK PHOS: 93 U/L — AB (ref 26–84)
ALT: 22 U/L (ref 10–47)
AST: 22 U/L (ref 11–38)
Anion gap: 7 (ref 5–15)
BILIRUBIN TOTAL: 0.5 mg/dL (ref 0.2–1.6)
BUN: 17 mg/dL (ref 7–22)
CO2: 24 mmol/L (ref 18–33)
CREATININE: 0.9 mg/dL (ref 0.60–1.20)
Calcium: 9 mg/dL (ref 8.0–10.3)
Chloride: 110 mmol/L — ABNORMAL HIGH (ref 98–108)
Glucose, Bld: 114 mg/dL (ref 73–118)
Potassium: 3.7 mmol/L (ref 3.3–4.7)
Sodium: 141 mmol/L (ref 128–145)
Total Protein: 7.3 g/dL (ref 6.4–8.1)

## 2017-11-06 LAB — CBC WITH DIFFERENTIAL (CANCER CENTER ONLY)
BASOS ABS: 0 10*3/uL (ref 0.0–0.1)
BASOS PCT: 0 %
Eosinophils Absolute: 0.2 10*3/uL (ref 0.0–0.5)
Eosinophils Relative: 2 %
HEMATOCRIT: 40.6 % (ref 34.8–46.6)
HEMOGLOBIN: 13.4 g/dL (ref 11.6–15.9)
LYMPHS PCT: 33 %
Lymphs Abs: 3.9 10*3/uL — ABNORMAL HIGH (ref 0.9–3.3)
MCH: 30.6 pg (ref 26.0–34.0)
MCHC: 33 g/dL (ref 32.0–36.0)
MCV: 92.7 fL (ref 81.0–101.0)
MONOS PCT: 6 %
Monocytes Absolute: 0.7 10*3/uL (ref 0.1–0.9)
NEUTROS ABS: 7 10*3/uL — AB (ref 1.5–6.5)
Neutrophils Relative %: 59 %
Platelet Count: 264 10*3/uL (ref 145–400)
RBC: 4.38 MIL/uL (ref 3.70–5.32)
RDW: 13.4 % (ref 11.1–15.7)
WBC Count: 11.9 10*3/uL — ABNORMAL HIGH (ref 3.9–10.0)

## 2017-11-06 LAB — LACTATE DEHYDROGENASE: LDH: 184 U/L (ref 98–192)

## 2017-11-06 MED ORDER — LANREOTIDE ACETATE 120 MG/0.5ML ~~LOC~~ SOLN
120.0000 mg | SUBCUTANEOUS | Status: DC
  Administered 2017-11-06: 120 mg via SUBCUTANEOUS

## 2017-11-06 MED ORDER — LANREOTIDE ACETATE 120 MG/0.5ML ~~LOC~~ SOLN
SUBCUTANEOUS | Status: AC
  Filled 2017-11-06: qty 120

## 2017-11-06 NOTE — Progress Notes (Signed)
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 Her most recent MRI liver in July 2019 showed overall minimal change in the known liver lesion, consistent w/ disease recurrence. Referral to interventional radiology was discussed during the visit in July, 2019 but the decision was to defer IR and repeat MRI liver in 6 months. Her next follow-up appt w/ surgical oncology at [facility] is around December 2019.  Continue monthly lanreotide injection.  Osteoarthritis Patient is scheduled for left total knee replacement October 2019.   I congratulated the patient on her weight loss in anticipation of surgery, and encouraged her to maintain ambulation as much as possible postop to reduce her risk of VTE.  Morbid obesity ([place]) Patient has lost at least 20 pounds over the past few month in preparation for her left knee total replacement. I congratulated the patient on her success and encouraged her to continue exercise and dietary modification.   Orders Placed This Encounter  Procedures  . CBC with Differential (Cancer Center Only)    Standing Status:   Future    Standing Expiration Date:   12/11/2018  . CMP ([place] only)    Standing Status:   Future    Standing Expiration Date:   12/11/2018  . Chromogranin A    Standing Status:   Future    Standing Expiration Date:   11/06/2018    All questions were answered. The patient knows to call the clinic with any problems, questions or concerns. No barriers to learning was detected.  A total of more than 15 minutes were spent face-to-face with the patient during this encounter and over half of that time was spent on counseling and coordination of care as outlined above.   RTC in 3 months for labs and follow-up with NP [facility].   Tish Men, MD 11/06/2017 2:25 PM  CHIEF COMPLAINT: "I am here for my liver tumor"  Current Therapy:   Somatuline 120 mg SQ monthly Open liver ablation - 01/07/16  INTERVAL HISTORY: Ms. Leah [name] returns to clinic for follow-up of metastatic neuroendocrine tumor with her son today.  She reports having had increased stress lately after her daughter-in-law moved in with her and she is helping her for house hunt. She was started on clonazepam (in addition to being on Effexor already) by her PCP with symptomatic improvement. She is scheduled for left knee total replacement in October 2019. She denies any new symptoms, such as fever, chill, night sweats, lymphadenopathy, abdominal pain, nausea, vomiting, diarrhea, or flushing.  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    03/15/2010 Cancer Staging    Staging form: Liver (Excluding Intrahepatic Bile Ducts), AJCC 7th Edition - Clinical stage from 03/15/2010: Stage IVB (T0, NX, M1) - Signed by Tish Men, MD on 11/04/2017    02/11/2011 Miscellaneous    Sandostatin initiated sometime in 2013.   Subsequently changed to and currently remains on monthly lanreotide.    08/11/2017 Imaging    MR liver (at [facility]): 1. Postsurgical changes of right hepatectomy.  2. Minimal increase in the size and conspicuity of the two hyperenhancing focal liver lesions near the resection margin, compatible with recurrence as described above.  3. Other findings as described above.     REVIEW OF SYSTEMS:   Constitutional: ( - ) fevers, ( - )  chills , ( - ) night sweats Eyes: ( - ) blurriness of vision, ( - ) double vision, ( - ) watery eyes Ears, nose, mouth, throat, and face: ( - ) mucositis, ( - ) sore throat Respiratory: ( - ) cough, ( - ) dyspnea, ( - ) wheezes Cardiovascular: ( - ) palpitation, ( - ) chest discomfort, ( - )  lower extremity swelling Gastrointestinal:  ( - ) nausea, ( - ) heartburn, ( - ) change in bowel habits Skin: ( - ) abnormal skin rashes Lymphatics: ( - ) new lymphadenopathy, ( - ) easy bruising Neurological: ( - ) numbness, ( - ) tingling, ( - ) new weaknesses All other systems were reviewed with the patient and are negative.  I have reviewed the past medical history, past surgical history, social history and family history with the patient and they are unchanged from previous note.  ALLERGIES:  has No Known Allergies.  MEDICATIONS:  Current Outpatient Medications  Medication Sig Dispense Refill  . Acyclovir-Hydrocortisone (XERESE) 5-1 % CREA APPLY TO EFFECTED AREA 5 g 0  . amLODipine (NORVASC) 10 MG tablet TAKE 1 TABLET BY MOUTH AT BEDTIME 90 tablet 1  . aspirin 81 MG EC tablet TAKE 1 TABLET (81 MG TOTAL) BY MOUTH DAILY. 90 tablet 0  . clonazePAM (KLONOPIN) 0.5 MG tablet Take 1 tablet (0.5 mg total) by mouth 2 (two) times daily as needed for anxiety. 30 tablet 1  . fexofenadine (ALLEGRA) 180 MG tablet TAKE 1 TABLET (180 MG TOTAL) BY MOUTH EVERY MORNING. 90 tablet 2  . fluticasone (FLONASE) 50 MCG/ACT nasal spray USE 2 SPRAYS IN BOTH NOSTRILS EVERY DAY 16 g 2  . gabapentin (NEURONTIN) 100 MG capsule Take 1 capsule (100 mg total) by mouth at bedtime. 90 capsule 3  . ketoconazole (NIZORAL) 2 % cream Apply 1 application topically 2 (two) times daily. 15 g 0  . metoprolol succinate (TOPROL-XL) 100 MG 24 hr tablet TAKE 1 TABLET BY MOUTH EVERY EVENING WITH OR IMMEDIATELY FOLLOWING A MEAL 90 tablet 1  . montelukast (SINGULAIR) 10 MG tablet TAKE 1 TABLET BY MOUTH EVERYDAY AT BEDTIME 90 tablet 0  . mupirocin ointment (BACTROBAN) 2 % Place 1 application into the nose 2 (two) times daily. 22 g 0  . naproxen (NAPROSYN) 500 MG tablet TAKE 1 TABLET (500 MG TOTAL) BY MOUTH 2 (TWO) TIMES DAILY WITH A MEAL. 180 tablet 2  . octreotide (SANDOSTATIN LAR) 30 MG injection Inject 30 mg into the muscle every 28  (twenty-eight) days.    . ondansetron (ZOFRAN-ODT) 4 MG disintegrating tablet DISSOLVE 1 TABLET BY MOUTH EVERY 8 HOURS AS NEEDED FOR NAUSEA AND VOMITING 90 tablet 2  . OSPHENA 60 MG TABS TAKE ONE TABLET BY MOUTH DAILY 90 tablet 3  . pantoprazole (PROTONIX) 40 MG tablet TAKE 1 TABLET BY MOUTH TWICE A DAY 180 tablet 0  . PROAIR HFA 108 (90 Base) MCG/ACT inhaler INHALE 2 PUFFS INTO THE LUNGS EVERY 6 (SIX) HOURS AS NEEDED. FOR SHORTNESS OF BREATH. 6.7 Inhaler 4  . QSYMIA 7.5-46 MG CP24 TAKE ONE CAPSULE BY MOUTH DAILY 30 capsule 1  . valACYclovir (VALTREX) 500 MG tablet TAKE 1 TABLET (500 MG TOTAL) BY MOUTH 2 (TWO) TIMES DAILY AS NEEDED. COLD SORES 30 tablet 0  . valsartan (DIOVAN) 320 MG tablet TAKE 1 TABLET (320 MG TOTAL)  BY MOUTH DAILY. 90 tablet 1  . venlafaxine XR (EFFEXOR-XR) 150 MG 24 hr capsule TAKE 1 CAPSULE (150 MG TOTAL) BY MOUTH EACH MORNING 90 capsule 1   No current facility-administered medications for this visit.    Facility-Administered Medications Ordered in Other Visits  Medication Dose Route Frequency Provider Last Rate Last Dose  . lanreotide acetate (SOMATULINE DEPOT) injection 120 mg  120 mg Subcutaneous Q28 days Tish Men, MD   120 mg at 11/06/17 1406    PHYSICAL EXAMINATION: ECOG PERFORMANCE STATUS: 0 - Asymptomatic  Vitals:   11/06/17 1338  BP: 134/75  Pulse: 74  Resp: 16  Temp: 99.1 F (37.3 C)  SpO2: 96%   Filed Weights   11/06/17 1338  Weight: 214 lb (97.1 kg)    GENERAL: alert, no distress and comfortable SKIN: skin color, texture, turgor are normal, no rashes or significant lesions EYES: conjunctiva are pink and non-injected, sclera clear OROPHARYNX: no exudate, no erythema; lips, buccal mucosa, and tongue normal  NECK: supple, non-tender LYMPH:  no palpable lymphadenopathy in the cervical or axillary region LUNGS: clear to auscultation and percussion with normal breathing effort HEART: regular rate & rhythm and no murmurs and no lower extremity  edema ABDOMEN: soft, non-tender, non-distended, normal bowel sounds Musculoskeletal: no cyanosis of digits and no clubbing  PSYCH: alert & oriented x 3, fluent speech NEURO: no focal motor/sensory deficits  LABORATORY DATA:  I have reviewed the data as listed    Component Value Date/Time   NA 141 11/06/2017 1312   NA 140 01/20/2017 1416   NA 141 01/17/2016 1047   K 3.7 11/06/2017 1312   K 3.9 01/20/2017 1416   K 3.2 (L) 01/17/2016 1047   CL 110 (H) 11/06/2017 1312   CL 102 01/20/2017 1416   CO2 24 11/06/2017 1312   CO2 26 01/20/2017 1416   CO2 27 01/17/2016 1047   GLUCOSE 114 11/06/2017 1312   GLUCOSE 159 (H) 01/20/2017 1416   BUN 17 11/06/2017 1312   BUN 15 01/20/2017 1416   BUN 6.6 (L) 01/17/2016 1047   CREATININE 0.90 11/06/2017 1312   CREATININE 1.1 01/20/2017 1416   CREATININE 0.7 01/17/2016 1047   CALCIUM 9.0 11/06/2017 1312   CALCIUM 9.1 01/20/2017 1416   CALCIUM 8.7 01/17/2016 1047   PROT 7.3 11/06/2017 1312   PROT 7.2 01/20/2017 1416   PROT 6.6 01/17/2016 1047   ALBUMIN 3.6 11/06/2017 1312   ALBUMIN 3.3 01/20/2017 1416   ALBUMIN 3.9 03/27/2016 1328   ALBUMIN 2.8 (L) 01/17/2016 1047   AST 22 11/06/2017 1312   AST 34 01/17/2016 1047   ALT 22 11/06/2017 1312   ALT 35 01/20/2017 1416   ALT 38 01/17/2016 1047   ALKPHOS 93 (H) 11/06/2017 1312   ALKPHOS 84 01/20/2017 1416   ALKPHOS 90 01/17/2016 1047   BILITOT 0.5 11/06/2017 1312   BILITOT 0.42 01/17/2016 1047   GFRNONAA 97 03/27/2016 1328   GFRAA 112 03/27/2016 1328    No results found for: SPEP, UPEP  Lab Results  Component Value Date   WBC 11.9 (H) 11/06/2017   NEUTROABS 7.0 (H) 11/06/2017   HGB 13.4 11/06/2017   HCT 40.6 11/06/2017   MCV 92.7 11/06/2017   PLT 264 11/06/2017      Chemistry      Component Value Date/Time   NA 141 11/06/2017 1312   NA 140 01/20/2017 1416   NA 141 01/17/2016 1047   K 3.7 11/06/2017 1312   K 3.9 01/20/2017 1416  K 3.2 (L) 01/17/2016 1047   CL 110 (H)  11/06/2017 1312   CL 102 01/20/2017 1416   CO2 24 11/06/2017 1312   CO2 26 01/20/2017 1416   CO2 27 01/17/2016 1047   BUN 17 11/06/2017 1312   BUN 15 01/20/2017 1416   BUN 6.6 (L) 01/17/2016 1047   CREATININE 0.90 11/06/2017 1312   CREATININE 1.1 01/20/2017 1416   CREATININE 0.7 01/17/2016 1047      Component Value Date/Time   CALCIUM 9.0 11/06/2017 1312   CALCIUM 9.1 01/20/2017 1416   CALCIUM 8.7 01/17/2016 1047   ALKPHOS 93 (H) 11/06/2017 1312   ALKPHOS 84 01/20/2017 1416   ALKPHOS 90 01/17/2016 1047   AST 22 11/06/2017 1312   AST 34 01/17/2016 1047   ALT 22 11/06/2017 1312   ALT 35 01/20/2017 1416   ALT 38 01/17/2016 1047   BILITOT 0.5 11/06/2017 1312   BILITOT 0.42 01/17/2016 1047       RADIOGRAPHIC STUDIES: I have personally reviewed the patient's MRI liver report in July 2019 from [facility] and agreed with the assessment.

## 2017-11-06 NOTE — Assessment & Plan Note (Signed)
Patient is scheduled for left total knee replacement October 2019.   I congratulated the patient on her weight loss in anticipation of surgery, and encouraged her to maintain ambulation as much as possible postop to reduce her risk of VTE.

## 2017-11-06 NOTE — Patient Instructions (Signed)
Lanreotide injection What is this medicine? LANREOTIDE (lan REE oh tide) is used to reduce blood levels of growth hormone in patients with a condition called acromegaly. It also works to slow or stop tumor growth in patients with neuroendocrine tumors and treat carcinoid syndrome. This medicine may be used for other purposes; ask your health care provider or pharmacist if you have questions. COMMON BRAND NAME(S): Somatuline [place] What should I tell my health care provider before I take this medicine? They need to know if you have any of these conditions: -diabetes -gallbladder disease -heart disease -kidney disease -liver disease -thyroid disease -an unusual or allergic reaction to lanreotide, other medicines, foods, dyes, or preservatives -pregnant or trying to get pregnant -breast-feeding How should I use this medicine? This medicine is for injection under the skin. It is given by a health care professional in a hospital or clinic setting. Contact your pediatrician or health care professional regarding the use of this medicine in children. Special care may be needed. Overdosage: If you think you have taken too much of this medicine contact a poison control center or emergency room at once. NOTE: This medicine is only for you. Do not share this medicine with others. What if I miss a dose? It is important not to miss your dose. Call your doctor or health care professional if you are unable to keep an appointment. What may interact with this medicine? This medicine may interact with the following medications: -bromocriptine -cyclosporine -certain medicines for blood pressure, heart disease, irregular heart beat -certain medicines for diabetes -quinidine -terfenadine This list may not describe all possible interactions. Give your health care provider a list of all the medicines, herbs, non-prescription drugs, or dietary supplements you use. Also tell them if you smoke, drink alcohol, or  use illegal drugs. Some items may interact with your medicine. What should I watch for while using this medicine? Tell your doctor or healthcare professional if your symptoms do not start to get better or if they get worse. Visit your doctor or health care professional for regular checks on your progress. Your condition will be monitored carefully while you are receiving this medicine. You may need blood work done while you are taking this medicine. Women should inform their doctor if they wish to become pregnant or think they might be pregnant. There is a potential for serious side effects to an unborn child. Talk to your health care professional or pharmacist for more information. Do not breast-feed an infant while taking this medicine or for 6 months after stopping it. This medicine has caused ovarian failure in some women. This medicine may interfere with the ability to have a child. Talk with your doctor or health care professional if you are concerned about your fertility. What side effects may I notice from receiving this medicine? Side effects that you should report to your doctor or health care professional as soon as possible: -allergic reactions like skin rash, itching or hives, swelling of the face, lips, or tongue -increased blood pressure -severe stomach pain -signs and symptoms of high blood sugar such as dizziness; dry mouth; dry skin; fruity breath; nausea; stomach pain; increased hunger or thirst; increased urination -signs and symptoms of low blood sugar such as feeling anxious; confusion; dizziness; increased hunger; unusually weak or tired; sweating; shakiness; cold; irritable; headache; blurred vision; fast heartbeat; loss of consciousness -unusually slow heartbeat Side effects that usually do not require medical attention (report to your doctor or health care professional if they continue   or are bothersome): -constipation -diarrhea -dizziness -headache -muscle pain -muscle  spasms -nausea -pain, redness, or irritation at site where injected This list may not describe all possible side effects. Call your doctor for medical advice about side effects. You may report side effects to FDA at 1-800-FDA-1088. Where should I keep my medicine? This drug is given in a hospital or clinic and will not be stored at home. NOTE: This sheet is a summary. It may not cover all possible information. If you have questions about this medicine, talk to your doctor, pharmacist, or health care provider.  2018 Elsevier/Gold Standard (2015-11-02 10:33:47)  

## 2017-11-06 NOTE — Assessment & Plan Note (Signed)
Patient has lost at least 20 pounds over the past few month in preparation for her left knee total replacement. I congratulated the patient on her success and encouraged her to continue exercise and dietary modification.

## 2017-11-09 LAB — CHROMOGRANIN A: CHROMOGRANIN A: 1 nmol/L (ref 0–5)

## 2017-11-10 ENCOUNTER — Ambulatory Visit: Payer: 59 | Admitting: Family

## 2017-11-10 ENCOUNTER — Other Ambulatory Visit: Payer: 59

## 2017-11-10 ENCOUNTER — Ambulatory Visit: Payer: 59

## 2017-11-27 NOTE — Progress Notes (Signed)
10-20-17 (Epic) Cardiac Clearance from Dr Gwenlyn Found in Office Visit, and EKG  12-31-16 (Epic) CXR

## 2017-11-27 NOTE — Patient Instructions (Addendum)
Leah [name]  11/27/2017   Your procedure is scheduled on: 12-07-17     Report to [facility] Main  Entrance    Report to Admitting at 10:10 AM    Call this number if you have problems the morning of surgery [phone]     Remember: Do not eat food or drink liquids :After Midnight.     BRUSH YOUR TEETH MORNING OF SURGERY AND RINSE YOUR MOUTH OUT, NO CHEWING GUM CANDY OR MINTS.     Take these medicines the morning of surgery with A SIP OF WATER:  Fexofenadine (Allegra), Pantoprazole (Protonix) and Venlafaxine-XR. You can also bring and use your nasal spray as needed.                                You may not have any metal on your body including hair pins and              piercings  Do not wear jewelry, make-up, lotions, powders or perfumes, deodorant             Do not wear nail polish.  Do not shave  48 hours prior to surgery.                 Do not bring valuables to the hospital. [place].  Contacts, dentures or bridgework may not be worn into surgery.  Leave suitcase in the car. After surgery it may be brought to your room.   Special Instructions: Please bring your mask and tubing for your CPAP machine              Please read over the following fact sheets you were given: _____________________________________________________________________             [facility] - Preparing for Surgery Before surgery, you can play an important role.  Because skin is not sterile, your skin needs to be as free of germs as possible.  You can reduce the number of germs on your skin by washing with CHG (chlorahexidine gluconate) soap before surgery.  CHG is an antiseptic cleaner which kills germs and bonds with the skin to continue killing germs even after washing. Please DO NOT use if you have an allergy to CHG or antibacterial soaps.  If your skin becomes reddened/irritated stop using the CHG and inform your  nurse when you arrive at Short Stay. Do not shave (including legs and underarms) for at least 48 hours prior to the first CHG shower.  You may shave your face/neck. Please follow these instructions carefully:  1.  Shower with CHG Soap the night before surgery and the  morning of Surgery.  2.  If you choose to wash your hair, wash your hair first as usual with your  normal  shampoo.  3.  After you shampoo, rinse your hair and body thoroughly to remove the  shampoo.                           4.  Use CHG as you would any other liquid soap.  You can apply chg directly  to the skin and wash  Gently with a scrungie or clean washcloth.  5.  Apply the CHG Soap to your body ONLY FROM THE NECK DOWN.   Do not use on face/ open                           Wound or open sores. Avoid contact with eyes, ears mouth and genitals (private parts).                       Wash face,  Genitals (private parts) with your normal soap.             6.  Wash thoroughly, paying special attention to the area where your surgery  will be performed.  7.  Thoroughly rinse your body with warm water from the neck down.  8.  DO NOT shower/wash with your normal soap after using and rinsing off  the CHG Soap.                9.  Pat yourself dry with a clean towel.            10.  Wear clean pajamas.            11.  Place clean sheets on your bed the night of your first shower and do not  sleep with pets. Day of Surgery : Do not apply any lotions/deodorants the morning of surgery.  Please wear clean clothes to the hospital/surgery center.  FAILURE TO FOLLOW THESE INSTRUCTIONS MAY RESULT IN THE CANCELLATION OF YOUR SURGERY PATIENT SIGNATURE_________________________________  NURSE SIGNATURE__________________________________  ________________________________________________________________________   Adam Phenix  An incentive spirometer is a tool that can help keep your lungs clear and active. This tool  measures how well you are filling your lungs with each breath. Taking long deep breaths may help reverse or decrease the chance of developing breathing (pulmonary) problems (especially infection) following:  A long period of time when you are unable to move or be active. BEFORE THE PROCEDURE   If the spirometer includes an indicator to show your best effort, your nurse or respiratory therapist will set it to a desired goal.  If possible, sit up straight or lean slightly forward. Try not to slouch.  Hold the incentive spirometer in an upright position. INSTRUCTIONS FOR USE  1. Sit on the edge of your bed if possible, or sit up as far as you can in bed or on a chair. 2. Hold the incentive spirometer in an upright position. 3. Breathe out normally. 4. Place the mouthpiece in your mouth and seal your lips tightly around it. 5. Breathe in slowly and as deeply as possible, raising the piston or the ball toward the top of the column. 6. Hold your breath for 3-5 seconds or for as long as possible. Allow the piston or ball to fall to the bottom of the column. 7. Remove the mouthpiece from your mouth and breathe out normally. 8. Rest for a few seconds and repeat Steps 1 through 7 at least 10 times every 1-2 hours when you are awake. Take your time and take a few normal breaths between deep breaths. 9. The spirometer may include an indicator to show your best effort. Use the indicator as a goal to work toward during each repetition. 10. After each set of 10 deep breaths, practice coughing to be sure your lungs are clear. If you have an incision (the cut made at the time of surgery),  support your incision when coughing by placing a pillow or rolled up towels firmly against it. Once you are able to get out of bed, walk around indoors and cough well. You may stop using the incentive spirometer when instructed by your caregiver.  RISKS AND COMPLICATIONS  Take your time so you do not get dizzy or  light-headed.  If you are in pain, you may need to take or ask for pain medication before doing incentive spirometry. It is harder to take a deep breath if you are having pain. AFTER USE  Rest and breathe slowly and easily.  It can be helpful to keep track of a log of your progress. Your caregiver can provide you with a simple table to help with this. If you are using the spirometer at home, follow these instructions: [place] IF:   You are having difficultly using the spirometer.  You have trouble using the spirometer as often as instructed.  Your pain medication is not giving enough relief while using the spirometer.  You develop fever of 100.5 F (38.1 C) or higher. SEEK IMMEDIATE MEDICAL CARE IF:   You cough up bloody sputum that had not been present before.  You develop fever of 102 F (38.9 C) or greater.  You develop worsening pain at or near the incision site. MAKE SURE YOU:   Understand these instructions.  Will watch your condition.  Will get help right away if you are not doing well or get worse. Document Released: 06/09/2006 Document Revised: 04/21/2011 Document Reviewed: 08/10/2006 ExitCare Patient Information 2014 Exit[employer], [place].   ________________________________________________________________________  WHAT IS A BLOOD TRANSFUSION? Blood Transfusion Information  A transfusion is the replacement of blood or some of its parts. Blood is made up of multiple cells which provide different functions.  Red blood cells carry oxygen and are used for blood loss replacement.  White blood cells fight against infection.  Platelets control bleeding.  Plasma helps clot blood.  Other blood products are available for specialized needs, such as hemophilia or other clotting disorders. BEFORE THE TRANSFUSION  Who gives blood for transfusions?   Healthy volunteers who are fully evaluated to make sure their blood is safe. This is blood bank  blood. Transfusion therapy is the safest it has ever been in the practice of medicine. Before blood is taken from a donor, a complete history is taken to make sure that person has no history of diseases nor engages in risky social behavior (examples are intravenous drug use or sexual activity with multiple partners). The donor's travel history is screened to minimize risk of transmitting infections, such as malaria. The donated blood is tested for signs of infectious diseases, such as HIV and hepatitis. The blood is then tested to be sure it is compatible with you in order to minimize the chance of a transfusion reaction. If you or a relative donates blood, this is often done in anticipation of surgery and is not appropriate for emergency situations. It takes many days to process the donated blood. RISKS AND COMPLICATIONS Although transfusion therapy is very safe and saves many lives, the main dangers of transfusion include:   Getting an infectious disease.  Developing a transfusion reaction. This is an allergic reaction to something in the blood you were given. Every precaution is taken to prevent this. The decision to have a blood transfusion has been considered carefully by your caregiver before blood is given. Blood is not given unless the benefits outweigh the risks. AFTER THE TRANSFUSION  Right after receiving a blood transfusion, you will usually feel much better and more energetic. This is especially true if your red blood cells have gotten low (anemic). The transfusion raises the level of the red blood cells which carry oxygen, and this usually causes an energy increase.  The nurse administering the transfusion will monitor you carefully for complications. HOME CARE INSTRUCTIONS  No special instructions are needed after a transfusion. You may find your energy is better. Speak with your caregiver about any limitations on activity for underlying diseases you may have. SEEK MEDICAL CARE IF:    Your condition is not improving after your transfusion.  You develop redness or irritation at the intravenous (IV) site. SEEK IMMEDIATE MEDICAL CARE IF:  Any of the following symptoms occur over the next 12 hours:  Shaking chills.  You have a temperature by mouth above 102 F (38.9 C), not controlled by medicine.  Chest, back, or muscle pain.  People around you feel you are not acting correctly or are confused.  Shortness of breath or difficulty breathing.  Dizziness and fainting.  You get a rash or develop hives.  You have a decrease in urine output.  Your urine turns a dark color or changes to pink, red, or brown. Any of the following symptoms occur over the next 10 days:  You have a temperature by mouth above 102 F (38.9 C), not controlled by medicine.  Shortness of breath.  Weakness after normal activity.  The white part of the eye turns yellow (jaundice).  You have a decrease in the amount of urine or are urinating less often.  Your urine turns a dark color or changes to pink, red, or brown. Document Released: 01/25/2000 Document Revised: 04/21/2011 Document Reviewed: 09/13/2007 [facility] Patient Information 2014 [place], [place].  _______________________________________________________________________

## 2017-11-30 ENCOUNTER — Encounter (HOSPITAL_COMMUNITY)
Admission: RE | Admit: 2017-11-30 | Discharge: 2017-11-30 | Disposition: A | Discharge status: Home / Self Care | Payer: 59 | Source: Ambulatory Visit | Attending: Orthopedic Surgery | Admitting: Orthopedic Surgery

## 2017-11-30 ENCOUNTER — Encounter (HOSPITAL_COMMUNITY): Payer: Self-pay

## 2017-11-30 ENCOUNTER — Other Ambulatory Visit: Payer: Self-pay

## 2017-11-30 DIAGNOSIS — Z01818 Encounter for other preprocedural examination: Secondary | ICD-10-CM | POA: Diagnosis not present

## 2017-11-30 DIAGNOSIS — M1712 Unilateral primary osteoarthritis, left knee: Secondary | ICD-10-CM | POA: Diagnosis not present

## 2017-11-30 LAB — CBC
HCT: 40.7 % (ref 36.0–46.0)
HEMOGLOBIN: 12.9 g/dL (ref 12.0–15.0)
MCH: 29.7 pg (ref 26.0–34.0)
MCHC: 31.7 g/dL (ref 30.0–36.0)
MCV: 93.8 fL (ref 80.0–100.0)
Platelets: 278 10*3/uL (ref 150–400)
RBC: 4.34 MIL/uL (ref 3.87–5.11)
RDW: 13.3 % (ref 11.5–15.5)
WBC: 10.7 10*3/uL — ABNORMAL HIGH (ref 4.0–10.5)
nRBC: 0 % (ref 0.0–0.2)

## 2017-11-30 LAB — COMPREHENSIVE METABOLIC PANEL
ALK PHOS: 81 U/L (ref 38–126)
ALT: 20 U/L (ref 0–44)
AST: 22 U/L (ref 15–41)
Albumin: 3.7 g/dL (ref 3.5–5.0)
Anion gap: 7 (ref 5–15)
BUN: 24 mg/dL — ABNORMAL HIGH (ref 6–20)
CALCIUM: 8.9 mg/dL (ref 8.9–10.3)
CO2: 23 mmol/L (ref 22–32)
CREATININE: 0.76 mg/dL (ref 0.44–1.00)
Chloride: 110 mmol/L (ref 98–111)
Glucose, Bld: 137 mg/dL — ABNORMAL HIGH (ref 70–99)
Potassium: 4.6 mmol/L (ref 3.5–5.1)
Sodium: 140 mmol/L (ref 135–145)
Total Bilirubin: 0.4 mg/dL (ref 0.3–1.2)
Total Protein: 7.5 g/dL (ref 6.5–8.1)

## 2017-11-30 LAB — PROTIME-INR
INR: 1.03
PROTHROMBIN TIME: 13.4 s (ref 11.4–15.2)

## 2017-11-30 LAB — SURGICAL PCR SCREEN
MRSA, PCR: NEGATIVE
Staphylococcus aureus: NEGATIVE

## 2017-11-30 LAB — APTT: aPTT: 26 seconds (ref 24–36)

## 2017-12-01 LAB — HEMOGLOBIN A1C
Hgb A1c MFr Bld: 6.5 % — ABNORMAL HIGH (ref 4.8–5.6)
Mean Plasma Glucose: 140 mg/dL

## 2017-12-07 ENCOUNTER — Encounter (HOSPITAL_COMMUNITY): Admission: RE | Payer: Self-pay | Source: Ambulatory Visit

## 2017-12-07 ENCOUNTER — Inpatient Hospital Stay (HOSPITAL_COMMUNITY): Admission: RE | Admit: 2017-12-07 | Payer: 59 | Source: Ambulatory Visit | Admitting: Orthopedic Surgery

## 2017-12-07 LAB — TYPE AND SCREEN
ABO/RH(D): A POS
Antibody Screen: NEGATIVE

## 2017-12-07 SURGERY — ARTHROPLASTY, KNEE, TOTAL
Anesthesia: Choice | Site: Knee | Laterality: Left

## 2017-12-11 ENCOUNTER — Inpatient Hospital Stay: Payer: 59 | Attending: Hematology & Oncology

## 2017-12-11 ENCOUNTER — Inpatient Hospital Stay: Payer: 59

## 2017-12-11 VITALS — BP 116/59 | HR 98 | Temp 98.1°F

## 2017-12-11 DIAGNOSIS — C7B02 Secondary carcinoid tumors of liver: Secondary | ICD-10-CM | POA: Diagnosis present

## 2017-12-11 DIAGNOSIS — C7A00 Malignant carcinoid tumor of unspecified site: Secondary | ICD-10-CM | POA: Insufficient documentation

## 2017-12-11 DIAGNOSIS — C7B8 Other secondary neuroendocrine tumors: Secondary | ICD-10-CM

## 2017-12-11 LAB — CBC WITH DIFFERENTIAL (CANCER CENTER ONLY)
Abs Immature Granulocytes: 0.02 10*3/uL (ref 0.00–0.07)
BASOS PCT: 1 %
Basophils Absolute: 0.1 10*3/uL (ref 0.0–0.1)
Eosinophils Absolute: 0.3 10*3/uL (ref 0.0–0.5)
Eosinophils Relative: 3 %
HCT: 42.1 % (ref 36.0–46.0)
Hemoglobin: 13.2 g/dL (ref 12.0–15.0)
Immature Granulocytes: 0 %
Lymphocytes Relative: 30 %
Lymphs Abs: 3.1 10*3/uL (ref 0.7–4.0)
MCH: 28.9 pg (ref 26.0–34.0)
MCHC: 31.4 g/dL (ref 30.0–36.0)
MCV: 92.3 fL (ref 80.0–100.0)
MONO ABS: 0.7 10*3/uL (ref 0.1–1.0)
Monocytes Relative: 7 %
NEUTROS ABS: 6.3 10*3/uL (ref 1.7–7.7)
Neutrophils Relative %: 59 %
Platelet Count: 266 10*3/uL (ref 150–400)
RBC: 4.56 MIL/uL (ref 3.87–5.11)
RDW: 13.3 % (ref 11.5–15.5)
WBC Count: 10.4 10*3/uL (ref 4.0–10.5)
nRBC: 0 % (ref 0.0–0.2)

## 2017-12-11 LAB — CMP (CANCER CENTER ONLY)
ALT: 23 U/L (ref 10–47)
AST: 24 U/L (ref 11–38)
Albumin: 3.4 g/dL — ABNORMAL LOW (ref 3.5–5.0)
Alkaline Phosphatase: 90 U/L — ABNORMAL HIGH (ref 26–84)
Anion gap: 8 (ref 5–15)
BUN: 19 mg/dL (ref 7–22)
CO2: 26 mmol/L (ref 18–33)
CREATININE: 0.9 mg/dL (ref 0.60–1.20)
Calcium: 9.6 mg/dL (ref 8.0–10.3)
Chloride: 108 mmol/L (ref 98–108)
Glucose, Bld: 137 mg/dL — ABNORMAL HIGH (ref 73–118)
POTASSIUM: 4.5 mmol/L (ref 3.3–4.7)
SODIUM: 142 mmol/L (ref 128–145)
TOTAL PROTEIN: 7.3 g/dL (ref 6.4–8.1)
Total Bilirubin: 0.6 mg/dL (ref 0.2–1.6)

## 2017-12-11 MED ORDER — LANREOTIDE ACETATE 120 MG/0.5ML ~~LOC~~ SOLN
120.0000 mg | SUBCUTANEOUS | Status: DC
  Administered 2017-12-11: 120 mg via SUBCUTANEOUS

## 2017-12-11 NOTE — Patient Instructions (Signed)
Lanreotide injection What is this medicine? LANREOTIDE (lan REE oh tide) is used to reduce blood levels of growth hormone in patients with a condition called acromegaly. It also works to slow or stop tumor growth in patients with neuroendocrine tumors and treat carcinoid syndrome. This medicine may be used for other purposes; ask your health care provider or pharmacist if you have questions. COMMON BRAND NAME(S): Somatuline [place] What should I tell my health care provider before I take this medicine? They need to know if you have any of these conditions: -diabetes -gallbladder disease -heart disease -kidney disease -liver disease -thyroid disease -an unusual or allergic reaction to lanreotide, other medicines, foods, dyes, or preservatives -pregnant or trying to get pregnant -breast-feeding How should I use this medicine? This medicine is for injection under the skin. It is given by a health care professional in a hospital or clinic setting. Contact your pediatrician or health care professional regarding the use of this medicine in children. Special care may be needed. Overdosage: If you think you have taken too much of this medicine contact a poison control center or emergency room at once. NOTE: This medicine is only for you. Do not share this medicine with others. What if I miss a dose? It is important not to miss your dose. Call your doctor or health care professional if you are unable to keep an appointment. What may interact with this medicine? This medicine may interact with the following medications: -bromocriptine -cyclosporine -certain medicines for blood pressure, heart disease, irregular heart beat -certain medicines for diabetes -quinidine -terfenadine This list may not describe all possible interactions. Give your health care provider a list of all the medicines, herbs, non-prescription drugs, or dietary supplements you use. Also tell them if you smoke, drink alcohol, or  use illegal drugs. Some items may interact with your medicine. What should I watch for while using this medicine? Tell your doctor or healthcare professional if your symptoms do not start to get better or if they get worse. Visit your doctor or health care professional for regular checks on your progress. Your condition will be monitored carefully while you are receiving this medicine. You may need blood work done while you are taking this medicine. Women should inform their doctor if they wish to become pregnant or think they might be pregnant. There is a potential for serious side effects to an unborn child. Talk to your health care professional or pharmacist for more information. Do not breast-feed an infant while taking this medicine or for 6 months after stopping it. This medicine has caused ovarian failure in some women. This medicine may interfere with the ability to have a child. Talk with your doctor or health care professional if you are concerned about your fertility. What side effects may I notice from receiving this medicine? Side effects that you should report to your doctor or health care professional as soon as possible: -allergic reactions like skin rash, itching or hives, swelling of the face, lips, or tongue -increased blood pressure -severe stomach pain -signs and symptoms of high blood sugar such as dizziness; dry mouth; dry skin; fruity breath; nausea; stomach pain; increased hunger or thirst; increased urination -signs and symptoms of low blood sugar such as feeling anxious; confusion; dizziness; increased hunger; unusually weak or tired; sweating; shakiness; cold; irritable; headache; blurred vision; fast heartbeat; loss of consciousness -unusually slow heartbeat Side effects that usually do not require medical attention (report to your doctor or health care professional if they continue   or are bothersome): -constipation -diarrhea -dizziness -headache -muscle pain -muscle  spasms -nausea -pain, redness, or irritation at site where injected This list may not describe all possible side effects. Call your doctor for medical advice about side effects. You may report side effects to FDA at 1-800-FDA-1088. Where should I keep my medicine? This drug is given in a hospital or clinic and will not be stored at home. NOTE: This sheet is a summary. It may not cover all possible information. If you have questions about this medicine, talk to your doctor, pharmacist, or health care provider.  2018 Elsevier/Gold Standard (2015-11-02 10:33:47)  

## 2017-12-14 LAB — CHROMOGRANIN A: Chromogranin A: 2 nmol/L (ref 0–5)

## 2017-12-20 ENCOUNTER — Other Ambulatory Visit: Payer: Self-pay | Admitting: Family Medicine

## 2017-12-29 ENCOUNTER — Encounter: Payer: Self-pay | Admitting: Family Medicine

## 2017-12-29 ENCOUNTER — Encounter: Payer: Self-pay | Admitting: Hematology & Oncology

## 2017-12-31 ENCOUNTER — Other Ambulatory Visit: Payer: Self-pay | Admitting: Family Medicine

## 2017-12-31 DIAGNOSIS — Z1231 Encounter for screening mammogram for malignant neoplasm of breast: Secondary | ICD-10-CM

## 2018-01-10 ENCOUNTER — Other Ambulatory Visit: Payer: Self-pay | Admitting: Family Medicine

## 2018-01-10 DIAGNOSIS — I10 Essential (primary) hypertension: Secondary | ICD-10-CM

## 2018-01-10 DIAGNOSIS — J301 Allergic rhinitis due to pollen: Secondary | ICD-10-CM

## 2018-01-10 DIAGNOSIS — M4722 Other spondylosis with radiculopathy, cervical region: Secondary | ICD-10-CM

## 2018-01-12 ENCOUNTER — Other Ambulatory Visit: Payer: Self-pay

## 2018-01-12 ENCOUNTER — Telehealth: Payer: Self-pay | Admitting: Cardiovascular Disease

## 2018-01-12 DIAGNOSIS — F419 Anxiety disorder, unspecified: Secondary | ICD-10-CM

## 2018-01-12 MED ORDER — PHENTERMINE-TOPIRAMATE ER 7.5-46 MG PO CP24: 1.0000 | ORAL_CAPSULE | Freq: Every day | ORAL | 1 refills | Status: DC

## 2018-01-12 MED ORDER — CLONAZEPAM 0.5 MG PO TABS: 0.5000 mg | ORAL_TABLET | Freq: Two times a day (BID) | ORAL | 1 refills | Status: DC | PRN

## 2018-01-12 NOTE — Telephone Encounter (Signed)
   [facility] Pre-operative Risk Assessment    Request for surgical clearance:  1. What type of surgery is being performed? Left total knee replacement   2. When is this surgery scheduled? Apr 05, 2018   3. What type of clearance is required (medical clearance vs. Pharmacy clearance to hold med vs. Both)? Medical  4. Are there any medications that need to be held prior to surgery and how long? None specified    5. Practice name and name of physician performing surgery? Dr. Maureen Ralphs with [facility]   6. What is your office phone number [phone]     7.   What is your office fax number [phone] (Attn: Glendale Chard)  8.   Anesthesia type (None, local, MAC, general) ? Choice    Sheral Apley M 01/12/2018, 2:24 PM  _________________________________________________________________   (provider comments below)

## 2018-01-12 NOTE — Telephone Encounter (Signed)
Fax received from pharmacy for refill on both meds. No f/u with [place].

## 2018-01-13 NOTE — Telephone Encounter (Signed)
   Primary Cardiologist: Quay Burow, MD  Chart reviewed as part of pre-operative protocol coverage. Patient was contacted 01/13/2018 in reference to pre-operative risk assessment for pending surgery as outlined below.  ROSHONDA SPERL was last seen 10/2017 by Dr. Gwenlyn Found. She was seen for preop clearance for anticipated TKR and cleared by Dr. Gwenlyn Found and felt to be low risk, however surgery date was postpone. Now scheduled for Jan. 2020.  Since that day, CAMISHA SREY has continued to do well. No cardiac symptoms. She denies any exertional CP or dyspnea.   Therefore, based on ACC/AHA guidelines, the patient would be at acceptable risk for the planned procedure without further cardiovascular testing. Low risk.   I will route this recommendation to the requesting party via Epic fax function and remove from pre-op pool.  Please call with questions.  Lyda Jester, PA-C 01/13/2018, 2:50 PM

## 2018-01-14 ENCOUNTER — Other Ambulatory Visit: Payer: Self-pay | Admitting: *Deleted

## 2018-01-14 DIAGNOSIS — C7B8 Other secondary neuroendocrine tumors: Secondary | ICD-10-CM

## 2018-01-15 ENCOUNTER — Inpatient Hospital Stay: Payer: 59

## 2018-01-15 ENCOUNTER — Inpatient Hospital Stay: Payer: 59 | Attending: Hematology & Oncology

## 2018-01-15 VITALS — BP 138/68 | HR 99 | Temp 98.0°F | Resp 18 | Ht 59.0 in | Wt 212.6 lb

## 2018-01-15 DIAGNOSIS — C7B02 Secondary carcinoid tumors of liver: Secondary | ICD-10-CM | POA: Insufficient documentation

## 2018-01-15 DIAGNOSIS — C7A00 Malignant carcinoid tumor of unspecified site: Secondary | ICD-10-CM | POA: Insufficient documentation

## 2018-01-15 DIAGNOSIS — C7B8 Other secondary neuroendocrine tumors: Secondary | ICD-10-CM

## 2018-01-15 LAB — CBC WITH DIFFERENTIAL (CANCER CENTER ONLY)
Abs Immature Granulocytes: 0.04 10*3/uL (ref 0.00–0.07)
BASOS PCT: 1 %
Basophils Absolute: 0.1 10*3/uL (ref 0.0–0.1)
Eosinophils Absolute: 0.2 10*3/uL (ref 0.0–0.5)
Eosinophils Relative: 2 %
HCT: 41.1 % (ref 36.0–46.0)
Hemoglobin: 12.9 g/dL (ref 12.0–15.0)
Immature Granulocytes: 0 %
LYMPHS ABS: 2.9 10*3/uL (ref 0.7–4.0)
Lymphocytes Relative: 27 %
MCH: 29.9 pg (ref 26.0–34.0)
MCHC: 31.4 g/dL (ref 30.0–36.0)
MCV: 95.4 fL (ref 80.0–100.0)
MONO ABS: 0.6 10*3/uL (ref 0.1–1.0)
MONOS PCT: 6 %
NEUTROS ABS: 6.8 10*3/uL (ref 1.7–7.7)
Neutrophils Relative %: 64 %
PLATELETS: 281 10*3/uL (ref 150–400)
RBC: 4.31 MIL/uL (ref 3.87–5.11)
RDW: 13.3 % (ref 11.5–15.5)
WBC Count: 10.7 10*3/uL — ABNORMAL HIGH (ref 4.0–10.5)
nRBC: 0 % (ref 0.0–0.2)

## 2018-01-15 LAB — CMP (CANCER CENTER ONLY)
ALT: 19 U/L (ref 0–44)
ANION GAP: 13 (ref 5–15)
AST: 18 U/L (ref 15–41)
Albumin: 4 g/dL (ref 3.5–5.0)
Alkaline Phosphatase: 100 U/L (ref 38–126)
BUN: 15 mg/dL (ref 6–20)
CHLORIDE: 104 mmol/L (ref 98–111)
CO2: 23 mmol/L (ref 22–32)
Calcium: 8.7 mg/dL — ABNORMAL LOW (ref 8.9–10.3)
Creatinine: 0.71 mg/dL (ref 0.44–1.00)
GFR, Est AFR Am: >60 mL/min (ref >60)
GFR, Estimated: >60 mL/min (ref >60)
Glucose, Bld: 148 mg/dL — ABNORMAL HIGH (ref 70–99)
POTASSIUM: 3.5 mmol/L (ref 3.5–5.1)
SODIUM: 140 mmol/L (ref 135–145)
TOTAL PROTEIN: 6.8 g/dL (ref 6.5–8.1)
Total Bilirubin: 0.5 mg/dL (ref 0.3–1.2)

## 2018-01-15 MED ORDER — LANREOTIDE ACETATE 120 MG/0.5ML ~~LOC~~ SOLN
120.0000 mg | SUBCUTANEOUS | Status: DC
  Administered 2018-01-15: 120 mg via SUBCUTANEOUS

## 2018-01-15 MED ORDER — LANREOTIDE ACETATE 120 MG/0.5ML ~~LOC~~ SOLN
SUBCUTANEOUS | Status: AC
  Filled 2018-01-15: qty 120

## 2018-01-15 NOTE — Patient Instructions (Signed)
Lanreotide injection What is this medicine? LANREOTIDE (lan REE oh tide) is used to reduce blood levels of growth hormone in patients with a condition called acromegaly. It also works to slow or stop tumor growth in patients with neuroendocrine tumors and treat carcinoid syndrome. This medicine may be used for other purposes; ask your health care provider or pharmacist if you have questions. COMMON BRAND NAME(S): Somatuline [place] What should I tell my health care provider before I take this medicine? They need to know if you have any of these conditions: -diabetes -gallbladder disease -heart disease -kidney disease -liver disease -thyroid disease -an unusual or allergic reaction to lanreotide, other medicines, foods, dyes, or preservatives -pregnant or trying to get pregnant -breast-feeding How should I use this medicine? This medicine is for injection under the skin. It is given by a health care professional in a hospital or clinic setting. Contact your pediatrician or health care professional regarding the use of this medicine in children. Special care may be needed. Overdosage: If you think you have taken too much of this medicine contact a poison control center or emergency room at once. NOTE: This medicine is only for you. Do not share this medicine with others. What if I miss a dose? It is important not to miss your dose. Call your doctor or health care professional if you are unable to keep an appointment. What may interact with this medicine? This medicine may interact with the following medications: -bromocriptine -cyclosporine -certain medicines for blood pressure, heart disease, irregular heart beat -certain medicines for diabetes -quinidine -terfenadine This list may not describe all possible interactions. Give your health care provider a list of all the medicines, herbs, non-prescription drugs, or dietary supplements you use. Also tell them if you smoke, drink alcohol, or  use illegal drugs. Some items may interact with your medicine. What should I watch for while using this medicine? Tell your doctor or healthcare professional if your symptoms do not start to get better or if they get worse. Visit your doctor or health care professional for regular checks on your progress. Your condition will be monitored carefully while you are receiving this medicine. You may need blood work done while you are taking this medicine. Women should inform their doctor if they wish to become pregnant or think they might be pregnant. There is a potential for serious side effects to an unborn child. Talk to your health care professional or pharmacist for more information. Do not breast-feed an infant while taking this medicine or for 6 months after stopping it. This medicine has caused ovarian failure in some women. This medicine may interfere with the ability to have a child. Talk with your doctor or health care professional if you are concerned about your fertility. What side effects may I notice from receiving this medicine? Side effects that you should report to your doctor or health care professional as soon as possible: -allergic reactions like skin rash, itching or hives, swelling of the face, lips, or tongue -increased blood pressure -severe stomach pain -signs and symptoms of high blood sugar such as dizziness; dry mouth; dry skin; fruity breath; nausea; stomach pain; increased hunger or thirst; increased urination -signs and symptoms of low blood sugar such as feeling anxious; confusion; dizziness; increased hunger; unusually weak or tired; sweating; shakiness; cold; irritable; headache; blurred vision; fast heartbeat; loss of consciousness -unusually slow heartbeat Side effects that usually do not require medical attention (report to your doctor or health care professional if they continue   or are bothersome): -constipation -diarrhea -dizziness -headache -muscle pain -muscle  spasms -nausea -pain, redness, or irritation at site where injected This list may not describe all possible side effects. Call your doctor for medical advice about side effects. You may report side effects to FDA at 1-800-FDA-1088. Where should I keep my medicine? This drug is given in a hospital or clinic and will not be stored at home. NOTE: This sheet is a summary. It may not cover all possible information. If you have questions about this medicine, talk to your doctor, pharmacist, or health care provider.  2018 Elsevier/Gold Standard (2015-11-02 10:33:47)  

## 2018-01-18 LAB — CHROMOGRANIN A: Chromogranin A: 2 nmol/L (ref 0–5)

## 2018-02-04 ENCOUNTER — Telehealth: Payer: Self-pay

## 2018-02-04 ENCOUNTER — Other Ambulatory Visit: Payer: Self-pay

## 2018-02-04 DIAGNOSIS — F419 Anxiety disorder, unspecified: Secondary | ICD-10-CM

## 2018-02-04 MED ORDER — VALACYCLOVIR HCL 500 MG PO TABS: 500.0000 mg | ORAL_TABLET | Freq: Two times a day (BID) | ORAL | 0 refills | Status: DC | PRN

## 2018-02-04 NOTE — Telephone Encounter (Signed)
Patient changed pharmacy would like new script sent

## 2018-02-04 NOTE — Telephone Encounter (Signed)
Krissia Christmas KeyDarnelle Bos - PA Case ID: [id number]  Need help? 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

## 2018-02-05 MED ORDER — PHENTERMINE-TOPIRAMATE ER 7.5-46 MG PO CP24: 1.0000 | ORAL_CAPSULE | Freq: Every day | ORAL | 1 refills | Status: DC

## 2018-02-05 MED ORDER — CLONAZEPAM 0.5 MG PO TABS: 0.5000 mg | ORAL_TABLET | Freq: Two times a day (BID) | ORAL | 1 refills | Status: DC | PRN

## 2018-02-08 ENCOUNTER — Other Ambulatory Visit: Payer: Self-pay | Admitting: Family Medicine

## 2018-02-08 DIAGNOSIS — F419 Anxiety disorder, unspecified: Secondary | ICD-10-CM

## 2018-02-08 MED ORDER — CLONAZEPAM 0.5 MG PO TABS: 0.5000 mg | ORAL_TABLET | Freq: Two times a day (BID) | ORAL | 1 refills | Status: DC | PRN

## 2018-02-08 MED ORDER — PHENTERMINE-TOPIRAMATE ER 7.5-46 MG PO CP24: 1.0000 | ORAL_CAPSULE | Freq: Every day | ORAL | 1 refills | Status: DC

## 2018-02-12 ENCOUNTER — Inpatient Hospital Stay: Payer: 59

## 2018-02-12 ENCOUNTER — Other Ambulatory Visit: Payer: Self-pay | Admitting: Family

## 2018-02-12 ENCOUNTER — Inpatient Hospital Stay: Payer: 59 | Attending: Hematology & Oncology | Admitting: Family

## 2018-02-12 ENCOUNTER — Encounter: Payer: Self-pay | Admitting: Family

## 2018-02-12 VITALS — BP 141/72 | HR 79 | Temp 98.4°F | Resp 18 | Ht 59.0 in | Wt 222.0 lb

## 2018-02-12 DIAGNOSIS — C7B8 Other secondary neuroendocrine tumors: Secondary | ICD-10-CM

## 2018-02-12 DIAGNOSIS — C7A00 Malignant carcinoid tumor of unspecified site: Secondary | ICD-10-CM | POA: Diagnosis present

## 2018-02-12 DIAGNOSIS — C7B02 Secondary carcinoid tumors of liver: Secondary | ICD-10-CM | POA: Diagnosis present

## 2018-02-12 DIAGNOSIS — Z79899 Other long term (current) drug therapy: Secondary | ICD-10-CM | POA: Diagnosis not present

## 2018-02-12 LAB — CMP (CANCER CENTER ONLY)
ALT: 18 U/L (ref 0–44)
AST: 16 U/L (ref 15–41)
Albumin: 3.7 g/dL (ref 3.5–5.0)
Alkaline Phosphatase: 112 U/L (ref 38–126)
Anion gap: 10 (ref 5–15)
BUN: 14 mg/dL (ref 6–20)
CALCIUM: 8.5 mg/dL — AB (ref 8.9–10.3)
CO2: 23 mmol/L (ref 22–32)
Chloride: 103 mmol/L (ref 98–111)
Creatinine: 0.7 mg/dL (ref 0.44–1.00)
GFR, Est AFR Am: >60 mL/min (ref >60)
Glucose, Bld: 175 mg/dL — ABNORMAL HIGH (ref 70–99)
Potassium: 3.8 mmol/L (ref 3.5–5.1)
Sodium: 136 mmol/L (ref 135–145)
Total Bilirubin: 0.5 mg/dL (ref 0.3–1.2)
Total Protein: 6.9 g/dL (ref 6.5–8.1)

## 2018-02-12 LAB — CBC WITH DIFFERENTIAL (CANCER CENTER ONLY)
Abs Immature Granulocytes: 0.04 10*3/uL (ref 0.00–0.07)
BASOS PCT: 1 %
Basophils Absolute: 0.1 10*3/uL (ref 0.0–0.1)
Eosinophils Absolute: 0.2 10*3/uL (ref 0.0–0.5)
Eosinophils Relative: 1 %
HCT: 42 % (ref 36.0–46.0)
Hemoglobin: 13.2 g/dL (ref 12.0–15.0)
Immature Granulocytes: 0 %
Lymphocytes Relative: 29 %
Lymphs Abs: 3.1 10*3/uL (ref 0.7–4.0)
MCH: 29 pg (ref 26.0–34.0)
MCHC: 31.4 g/dL (ref 30.0–36.0)
MCV: 92.3 fL (ref 80.0–100.0)
Monocytes Absolute: 0.6 10*3/uL (ref 0.1–1.0)
Monocytes Relative: 6 %
Neutro Abs: 6.6 10*3/uL (ref 1.7–7.7)
Neutrophils Relative %: 63 %
Platelet Count: 311 10*3/uL (ref 150–400)
RBC: 4.55 MIL/uL (ref 3.87–5.11)
RDW: 12.8 % (ref 11.5–15.5)
WBC Count: 10.6 10*3/uL — ABNORMAL HIGH (ref 4.0–10.5)
nRBC: 0 % (ref 0.0–0.2)

## 2018-02-12 MED ORDER — LANREOTIDE ACETATE 120 MG/0.5ML ~~LOC~~ SOLN
120.0000 mg | SUBCUTANEOUS | Status: DC
  Administered 2018-02-12: 120 mg via SUBCUTANEOUS

## 2018-02-12 MED ORDER — LANREOTIDE ACETATE 120 MG/0.5ML ~~LOC~~ SOLN
SUBCUTANEOUS | Status: AC
  Filled 2018-02-12: qty 120

## 2018-02-12 NOTE — Progress Notes (Signed)
Hematology and Oncology Follow Up Visit  Leah [name] [id number] Dec 14, 1960 58 y.o. 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 She is doing well and has no complaints at this time. She had some nausea and constipation last month but this has since resolved.  Follow-up MRI of the liver with [facility] on 12/31 showed of the 2 previously noted lesions near the resection margin that one has slightly increased in size as compared to 08/11/2017, while the other is similar in size. No new suspicious lesions. She sees Dr. Crisoforo Oxford next week to go over her results.  Chromogranin A last month was 2.  No fever, chills, n/v, cough, rash, dizziness, SOB, chest pain, palpitations, abdominal pain or changes in bowel or bladder habits.  No swelling, tenderness, numbness or tingling in her extremities.  No lymphadenopathy noted on exam.  She states that she has a good appetite and is staying well hydrated. Her weight is up 10 lbs since last visit.  She has put off her knee replacement until her home renovations are complete.   ECOG Performance Status: 1 - Symptomatic but completely ambulatory  Medications:  Allergies as of 02/12/2018   No Known Allergies     Medication List       Accurate as of February 12, 2018 11:56 AM. Always use your most recent med list.        Acyclovir-Hydrocortisone 5-1 % Crea Commonly known as:  XERESE APPLY TO EFFECTED AREA   amLODipine 10 MG tablet Commonly known as:  NORVASC TAKE 1 TABLET BY MOUTH AT BEDTIME   aspirin 81 MG EC tablet TAKE 1 TABLET (81 MG TOTAL) BY MOUTH DAILY.   clonazePAM 0.5 MG tablet Commonly known as:  KLONOPIN Take 1 tablet (0.5 mg total) by mouth 2 (two) times daily as needed for anxiety.   fexofenadine 180  MG tablet Commonly known as:  ALLEGRA TAKE 1 TABLET (180 MG TOTAL) BY MOUTH EVERY MORNING.   fluticasone 50 MCG/ACT nasal spray Commonly known as:  FLONASE USE 2 SPRAYS IN BOTH NOSTRILS EVERY DAY   gabapentin 100 MG capsule Commonly known as:  NEURONTIN TAKE 1 CAPSULE (100 MG TOTAL) BY MOUTH AT BEDTIME.   metoprolol succinate 100 MG 24 hr tablet Commonly known as:  TOPROL-XL TAKE 1 TABLET BY MOUTH EVERY EVENING WITH OR IMMEDIATELY FOLLOWING A MEAL   montelukast 10 MG tablet Commonly known as:  SINGULAIR TAKE 1 TABLET BY MOUTH EVERYDAY AT BEDTIME   octreotide 30 MG injection Commonly known as:  SANDOSTATIN LAR Inject 30 mg into the muscle every 28 (twenty-eight) days.   ondansetron 4 MG disintegrating tablet Commonly known as:  ZOFRAN-ODT DISSOLVE 1 TABLET BY MOUTH EVERY 8 HOURS AS NEEDED FOR NAUSEA AND VOMITING   OSPHENA 60 MG Tabs Generic drug:  Ospemifene TAKE ONE TABLET BY MOUTH DAILY   pantoprazole 40 MG tablet Commonly known as:  PROTONIX TAKE 1 TABLET BY MOUTH TWICE A DAY   Phentermine-Topiramate 7.5-46 MG Cp24 Commonly known as:  QSYMIA Take 1 capsule by mouth daily.   PROAIR HFA 108 (90 Base) MCG/ACT inhaler Generic drug:  albuterol INHALE 2 PUFFS INTO THE LUNGS EVERY 6 (SIX) HOURS AS NEEDED. FOR SHORTNESS OF BREATH.   valACYclovir 500 MG tablet Commonly known as:  VALTREX Take 1  tablet (500 mg total) by mouth 2 (two) times daily as needed. Cold sores   valsartan 320 MG tablet Commonly known as:  DIOVAN TAKE 1 TABLET (320 MG TOTAL) BY MOUTH DAILY.   venlafaxine XR 150 MG 24 hr capsule Commonly known as:  EFFEXOR-XR TAKE 1 CAPSULE (150 MG TOTAL) BY MOUTH EACH MORNING       Allergies: No Known Allergies  Past Medical History, Surgical history, Social history, and Family History were reviewed and updated.  Review of Systems: All other 10 point review of systems is negative.   Physical Exam:  height is 4\' 11"  (1.499 m) and weight is 222 lb (100.7  kg). Her oral temperature is 98.4 F (36.9 C). Her blood pressure is 141/72 (abnormal) and her pulse is 79. Her respiration is 18 and oxygen saturation is 100%.   Wt Readings from Last 3 Encounters:  02/12/18 222 lb (100.7 kg)  01/15/18 212 lb 9 oz (96.4 kg)  11/30/17 212 lb 8 oz (96.4 kg)    Ocular: Sclerae unicteric, pupils equal, round and reactive to light Ear-nose-throat: Oropharynx clear, dentition fair Lymphatic: No cervical, supraclavicular or axillary adenopathy Lungs no rales or rhonchi, good excursion bilaterally Heart regular rate and rhythm, no murmur appreciated Abd soft, nontender, positive bowel sounds, no liver or spleen tip palpated on exam, no fluid wave  MSK no focal spinal tenderness, no joint edema Neuro: non-focal, well-oriented, appropriate affect Breasts: Deferred   Lab Results  Component Value Date   WBC 10.6 (H) 02/12/2018   HGB 13.2 02/12/2018   HCT 42.0 02/12/2018   MCV 92.3 02/12/2018   PLT 311 02/12/2018   Lab Results  Component Value Date   FERRITIN 382 (H) 10/18/2008   IRON <10 (L) 10/18/2008   TIBC NOT CALC Not calculated due to Iron <10. 10/18/2008   UIBC 156 10/18/2008   IRONPCTSAT NOT CALC Not calculated due to Iron <10. 10/18/2008   Lab Results  Component Value Date   RETICCTPCT 0.9 10/18/2008   RBC 4.55 02/12/2018   No results found for: KPAFRELGTCHN, LAMBDASER, KAPLAMBRATIO No results found for: Kandis Cocking, IGMSERUM No results found for: Odetta Pink, SPEI   Chemistry      Component Value Date/Time   NA 136 02/12/2018 1114   NA 140 01/20/2017 1416   NA 141 01/17/2016 1047   K 3.8 02/12/2018 1114   K 3.9 01/20/2017 1416   K 3.2 (L) 01/17/2016 1047   CL 103 02/12/2018 1114   CL 102 01/20/2017 1416   CO2 23 02/12/2018 1114   CO2 26 01/20/2017 1416   CO2 27 01/17/2016 1047   BUN 14 02/12/2018 1114   BUN 15 01/20/2017 1416   BUN 6.6 (L) 01/17/2016 1047   CREATININE  0.70 02/12/2018 1114   CREATININE 1.1 01/20/2017 1416   CREATININE 0.7 01/17/2016 1047      Component Value Date/Time   CALCIUM 8.5 (L) 02/12/2018 1114   CALCIUM 9.1 01/20/2017 1416   CALCIUM 8.7 01/17/2016 1047   ALKPHOS 112 02/12/2018 1114   ALKPHOS 84 01/20/2017 1416   ALKPHOS 90 01/17/2016 1047   AST 16 02/12/2018 1114   AST 34 01/17/2016 1047   ALT 18 02/12/2018 1114   ALT 35 01/20/2017 1416   ALT 38 01/17/2016 1047   BILITOT 0.5 02/12/2018 1114   BILITOT 0.42 01/17/2016 1047       Impression and Plan: Ms. Denny is a very pleasant 58 yo caucasian female with  long standing history of metastatic carcinoid with recurrent liver metastasis.  Dr. Crisoforo Oxford with [facility] is following her liver lesions and repeat MRI on 12/31 showed the 2 previously noted lesions near the resection margin that one has slightly increased in size as compared to 08/11/2017, while the other is similar in size. No new suspicious lesions. She sees him next week to go over these results.  We will proceed with Somatuline injection today as planned. She will stay on her monthly injection plan and we will see her for follow-up in 3 months.  She will contact our office with any questions or concerns. We can certainly see her sooner if need be.   Laverna Peace, NP 1/3/202011:56 AM

## 2018-02-12 NOTE — Progress Notes (Unsigned)
Hematology and Oncology Follow Up Visit  Leah [name] [id number] 01-14-1961 58 y.o. 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 She is   MRI of the liver with [facility] on 12/31 showed of the 2 previously noted lesions near the resection margin that one has slightly increased in size as compared to 08/11/2017, while the other is similar in size. No new suspicious lesions.   No fever, chills,   ECOG Performance Status: 1 - Symptomatic but completely ambulatory  Medications:  Allergies as of 02/12/2018   No Known Allergies     Medication List       Accurate as of February 12, 2018 11:13 AM. Always use your most recent med list.        Acyclovir-Hydrocortisone 5-1 % Crea Commonly known as:  XERESE APPLY TO EFFECTED AREA   amLODipine 10 MG tablet Commonly known as:  NORVASC TAKE 1 TABLET BY MOUTH AT BEDTIME   aspirin 81 MG EC tablet TAKE 1 TABLET (81 MG TOTAL) BY MOUTH DAILY.   clonazePAM 0.5 MG tablet Commonly known as:  KLONOPIN Take 1 tablet (0.5 mg total) by mouth 2 (two) times daily as needed for anxiety.   fexofenadine 180 MG tablet Commonly known as:  ALLEGRA TAKE 1 TABLET (180 MG TOTAL) BY MOUTH EVERY MORNING.   fluticasone 50 MCG/ACT nasal spray Commonly known as:  FLONASE USE 2 SPRAYS IN BOTH NOSTRILS EVERY DAY   gabapentin 100 MG capsule Commonly known as:  NEURONTIN TAKE 1 CAPSULE (100 MG TOTAL) BY MOUTH AT BEDTIME.   ketoconazole 2 % cream Commonly known as:  NIZORAL Apply 1 application topically 2 (two) times daily.   metoprolol succinate 100 MG 24 hr tablet Commonly known as:  TOPROL-XL TAKE 1 TABLET BY MOUTH EVERY EVENING WITH OR IMMEDIATELY FOLLOWING A MEAL   montelukast 10 MG tablet Commonly known as:  SINGULAIR TAKE 1  TABLET BY MOUTH EVERYDAY AT BEDTIME   mupirocin ointment 2 % Commonly known as:  BACTROBAN Place 1 application into the nose 2 (two) times daily.   octreotide 30 MG injection Commonly known as:  SANDOSTATIN LAR Inject 30 mg into the muscle every 28 (twenty-eight) days.   ondansetron 4 MG disintegrating tablet Commonly known as:  ZOFRAN-ODT DISSOLVE 1 TABLET BY MOUTH EVERY 8 HOURS AS NEEDED FOR NAUSEA AND VOMITING   OSPHENA 60 MG Tabs Generic drug:  Ospemifene TAKE ONE TABLET BY MOUTH DAILY   pantoprazole 40 MG tablet Commonly known as:  PROTONIX TAKE 1 TABLET BY MOUTH TWICE A DAY   Phentermine-Topiramate 7.5-46 MG Cp24 Commonly known as:  QSYMIA Take 1 capsule by mouth daily.   PROAIR HFA 108 (90 Base) MCG/ACT inhaler Generic drug:  albuterol INHALE 2 PUFFS INTO THE LUNGS EVERY 6 (SIX) HOURS AS NEEDED. FOR SHORTNESS OF BREATH.   valACYclovir 500 MG tablet Commonly known as:  VALTREX Take 1 tablet (500 mg total) by mouth 2 (two) times daily as needed. Cold sores   valsartan 320 MG tablet Commonly known as:  DIOVAN TAKE 1 TABLET (320 MG TOTAL) BY MOUTH DAILY.   venlafaxine XR 150 MG 24 hr capsule Commonly known as:  EFFEXOR-XR TAKE 1 CAPSULE (150 MG TOTAL) BY MOUTH EACH MORNING       Allergies: No Known Allergies  Past Medical History,  Surgical history, Social history, and Family History were reviewed and updated.  Review of Systems: All other 10 point review of systems is negative.   Physical Exam:  vitals were not taken for this visit.   Wt Readings from Last 3 Encounters:  01/15/18 212 lb 9 oz (96.4 kg)  11/30/17 212 lb 8 oz (96.4 kg)  11/06/17 214 lb (97.1 kg)    Ocular: Sclerae unicteric, pupils equal, round and reactive to light Ear-nose-throat: Oropharynx clear, dentition fair Lymphatic: No cervical, supraclavicular or axillary adenopathy Lungs no rales or rhonchi, good excursion bilaterally Heart regular rate and rhythm, no murmur  appreciated Abd soft, nontender, positive bowel sounds, no liver or spleen tip palpated on exam, no fluid wave  MSK no focal spinal tenderness, no joint edema Neuro: non-focal, well-oriented, appropriate affect Breasts: Deferred   Lab Results  Component Value Date   WBC 10.7 (H) 01/15/2018   HGB 12.9 01/15/2018   HCT 41.1 01/15/2018   MCV 95.4 01/15/2018   PLT 281 01/15/2018   Lab Results  Component Value Date   FERRITIN 382 (H) 10/18/2008   IRON <10 (L) 10/18/2008   TIBC NOT CALC Not calculated due to Iron <10. 10/18/2008   UIBC 156 10/18/2008   IRONPCTSAT NOT CALC Not calculated due to Iron <10. 10/18/2008   Lab Results  Component Value Date   RETICCTPCT 0.9 10/18/2008   RBC 4.31 01/15/2018   No results found for: KPAFRELGTCHN, LAMBDASER, KAPLAMBRATIO No results found for: IGGSERUM, IGA, IGMSERUM No results found for: Odetta Pink, SPEI   Chemistry      Component Value Date/Time   NA 140 01/15/2018 1138   NA 140 01/20/2017 1416   NA 141 01/17/2016 1047   K 3.5 01/15/2018 1138   K 3.9 01/20/2017 1416   K 3.2 (L) 01/17/2016 1047   CL 104 01/15/2018 1138   CL 102 01/20/2017 1416   CO2 23 01/15/2018 1138   CO2 26 01/20/2017 1416   CO2 27 01/17/2016 1047   BUN 15 01/15/2018 1138   BUN 15 01/20/2017 1416   BUN 6.6 (L) 01/17/2016 1047   CREATININE 0.71 01/15/2018 1138   CREATININE 1.1 01/20/2017 1416   CREATININE 0.7 01/17/2016 1047      Component Value Date/Time   CALCIUM 8.7 (L) 01/15/2018 1138   CALCIUM 9.1 01/20/2017 1416   CALCIUM 8.7 01/17/2016 1047   ALKPHOS 100 01/15/2018 1138   ALKPHOS 84 01/20/2017 1416   ALKPHOS 90 01/17/2016 1047   AST 18 01/15/2018 1138   AST 34 01/17/2016 1047   ALT 19 01/15/2018 1138   ALT 35 01/20/2017 1416   ALT 38 01/17/2016 1047   BILITOT 0.5 01/15/2018 1138   BILITOT 0.42 01/17/2016 1047       Impression and Plan: Leah [name] is ***  Laverna Peace,  NP 1/3/202011:13 AM

## 2018-02-16 NOTE — Telephone Encounter (Signed)
approved eff 02/04/2018 to 02/05/2019

## 2018-02-18 ENCOUNTER — Telehealth: Payer: Self-pay | Admitting: Family

## 2018-02-18 ENCOUNTER — Other Ambulatory Visit: Payer: Self-pay | Admitting: Family

## 2018-02-18 ENCOUNTER — Other Ambulatory Visit: Payer: Self-pay | Admitting: Family Medicine

## 2018-02-18 DIAGNOSIS — J301 Allergic rhinitis due to pollen: Secondary | ICD-10-CM

## 2018-02-18 DIAGNOSIS — C7B8 Other secondary neuroendocrine tumors: Secondary | ICD-10-CM

## 2018-02-18 NOTE — Telephone Encounter (Signed)
Lab appt scheduled patient notified per 1/9 sch msg

## 2018-02-19 ENCOUNTER — Inpatient Hospital Stay: Payer: 59

## 2018-02-19 DIAGNOSIS — C7A00 Malignant carcinoid tumor of unspecified site: Secondary | ICD-10-CM | POA: Diagnosis not present

## 2018-02-19 DIAGNOSIS — C7B8 Other secondary neuroendocrine tumors: Secondary | ICD-10-CM

## 2018-02-19 LAB — CHROMOGRANIN A

## 2018-02-22 LAB — CHROMOGRANIN A: Chromogranin A: 80.7 ng/mL (ref 0.0–101.8)

## 2018-02-23 NOTE — Telephone Encounter (Signed)
Patient came in today requesting to have the fee be waived. I emailed charge corrections to waive the fee out of courtesy. Patient was advised this does take up to a week or more and to hold onto all statements.

## 2018-03-03 ENCOUNTER — Other Ambulatory Visit: Payer: Self-pay | Admitting: Family Medicine

## 2018-03-05 ENCOUNTER — Telehealth: Payer: Self-pay | Admitting: *Deleted

## 2018-03-05 NOTE — Telephone Encounter (Signed)
Spoke with husband regarding patient is vomiting and has diarrhea. Instructed him to take Immodium for the diarrhea. Take the Zofran 4mg  ODT every eight hours as instructed on the bottle. Push fluids and have good handwashing. He verbalized understanding.

## 2018-03-08 ENCOUNTER — Other Ambulatory Visit: Payer: Self-pay | Admitting: *Deleted

## 2018-03-08 ENCOUNTER — Telehealth: Payer: Self-pay | Admitting: *Deleted

## 2018-03-08 MED ORDER — OSPEMIFENE 60 MG PO TABS: 1.0000 | ORAL_TABLET | Freq: Every day | ORAL | 3 refills | Status: DC

## 2018-03-08 NOTE — Telephone Encounter (Signed)
Patient called scheduling and informed them that she is still having "abdominal cramping and nausea" and would like to know if she needs to come in.  Call placed back to patient and pt states that her nausea is much better and is controlled with zofran, that she is able to keep fluids and food down at this time and vomiting and diarrhea have stopped except for slight abdominal cramping. Patient notified to continue Zofran, push fluids and to call back if symptoms worsen.  Patient appreciative of call back and has no further questions at this time.

## 2018-03-09 ENCOUNTER — Other Ambulatory Visit: Payer: Self-pay | Admitting: Family Medicine

## 2018-03-09 DIAGNOSIS — F419 Anxiety disorder, unspecified: Secondary | ICD-10-CM

## 2018-03-10 NOTE — Telephone Encounter (Signed)
Ok to fill 

## 2018-03-11 ENCOUNTER — Other Ambulatory Visit: Payer: Self-pay | Admitting: *Deleted

## 2018-03-11 DIAGNOSIS — C7B8 Other secondary neuroendocrine tumors: Secondary | ICD-10-CM

## 2018-03-12 ENCOUNTER — Inpatient Hospital Stay: Payer: 59

## 2018-03-12 VITALS — BP 135/50 | HR 68 | Temp 98.9°F | Resp 20

## 2018-03-12 DIAGNOSIS — C7B8 Other secondary neuroendocrine tumors: Secondary | ICD-10-CM

## 2018-03-12 DIAGNOSIS — C7A00 Malignant carcinoid tumor of unspecified site: Secondary | ICD-10-CM | POA: Diagnosis not present

## 2018-03-12 LAB — CBC WITH DIFFERENTIAL (CANCER CENTER ONLY)
Abs Immature Granulocytes: 0.03 10*3/uL (ref 0.00–0.07)
Basophils Absolute: 0.1 10*3/uL (ref 0.0–0.1)
Basophils Relative: 1 %
Eosinophils Absolute: 0.3 10*3/uL (ref 0.0–0.5)
Eosinophils Relative: 3 %
HCT: 39.1 % (ref 36.0–46.0)
Hemoglobin: 12.4 g/dL (ref 12.0–15.0)
Immature Granulocytes: 0 %
Lymphocytes Relative: 33 %
Lymphs Abs: 3 10*3/uL (ref 0.7–4.0)
MCH: 29.5 pg (ref 26.0–34.0)
MCHC: 31.7 g/dL (ref 30.0–36.0)
MCV: 92.9 fL (ref 80.0–100.0)
MONO ABS: 0.6 10*3/uL (ref 0.1–1.0)
Monocytes Relative: 6 %
Neutro Abs: 5.1 10*3/uL (ref 1.7–7.7)
Neutrophils Relative %: 57 %
Platelet Count: 276 10*3/uL (ref 150–400)
RBC: 4.21 MIL/uL (ref 3.87–5.11)
RDW: 13.4 % (ref 11.5–15.5)
WBC Count: 9 10*3/uL (ref 4.0–10.5)
nRBC: 0 % (ref 0.0–0.2)

## 2018-03-12 LAB — CMP (CANCER CENTER ONLY)
ALT: 18 U/L (ref 0–44)
AST: 13 U/L — AB (ref 15–41)
Albumin: 3.9 g/dL (ref 3.5–5.0)
Alkaline Phosphatase: 81 U/L (ref 38–126)
Anion gap: 6 (ref 5–15)
BUN: 23 mg/dL — AB (ref 6–20)
CO2: 26 mmol/L (ref 22–32)
CREATININE: 0.71 mg/dL (ref 0.44–1.00)
Calcium: 9.2 mg/dL (ref 8.9–10.3)
Chloride: 108 mmol/L (ref 98–111)
GFR, Est AFR Am: >60 mL/min (ref >60)
GFR, Estimated: >60 mL/min (ref >60)
Glucose, Bld: 130 mg/dL — ABNORMAL HIGH (ref 70–99)
Potassium: 5 mmol/L (ref 3.5–5.1)
Sodium: 140 mmol/L (ref 135–145)
Total Bilirubin: 0.3 mg/dL (ref 0.3–1.2)
Total Protein: 6.4 g/dL — ABNORMAL LOW (ref 6.5–8.1)

## 2018-03-12 MED ORDER — LANREOTIDE ACETATE 120 MG/0.5ML ~~LOC~~ SOLN
120.0000 mg | SUBCUTANEOUS | Status: DC
  Administered 2018-03-12: 120 mg via SUBCUTANEOUS

## 2018-03-12 MED ORDER — LANREOTIDE ACETATE 120 MG/0.5ML ~~LOC~~ SOLN
SUBCUTANEOUS | Status: AC
  Filled 2018-03-12: qty 120

## 2018-03-28 ENCOUNTER — Other Ambulatory Visit: Payer: Self-pay | Admitting: Family Medicine

## 2018-03-28 DIAGNOSIS — I10 Essential (primary) hypertension: Secondary | ICD-10-CM

## 2018-03-28 DIAGNOSIS — J301 Allergic rhinitis due to pollen: Secondary | ICD-10-CM

## 2018-03-28 DIAGNOSIS — M4722 Other spondylosis with radiculopathy, cervical region: Secondary | ICD-10-CM

## 2018-03-31 ENCOUNTER — Ambulatory Visit
Admission: RE | Admit: 2018-03-31 | Discharge: 2018-03-31 | Disposition: A | Discharge status: Home / Self Care | Payer: 59 | Source: Ambulatory Visit | Attending: Family Medicine | Admitting: Family Medicine

## 2018-03-31 ENCOUNTER — Other Ambulatory Visit: Payer: Self-pay | Admitting: Family

## 2018-03-31 DIAGNOSIS — C7B8 Other secondary neuroendocrine tumors: Secondary | ICD-10-CM

## 2018-03-31 DIAGNOSIS — Z1231 Encounter for screening mammogram for malignant neoplasm of breast: Secondary | ICD-10-CM

## 2018-04-05 ENCOUNTER — Inpatient Hospital Stay (HOSPITAL_COMMUNITY): Admission: RE | Admit: 2018-04-05 | Payer: 59 | Source: Ambulatory Visit | Admitting: Orthopedic Surgery

## 2018-04-05 ENCOUNTER — Encounter (HOSPITAL_COMMUNITY): Admission: RE | Payer: Self-pay | Source: Ambulatory Visit

## 2018-04-05 ENCOUNTER — Telehealth: Payer: Self-pay

## 2018-04-05 SURGERY — ARTHROPLASTY, KNEE, TOTAL
Anesthesia: Choice | Laterality: Left

## 2018-04-05 NOTE — Telephone Encounter (Signed)
Keileigh Heal Key: [id number] - PA Case ID: [id number] - Rx #: [id number] Need help? Call us at [phone]  Outcome  Approvedtoday  Your PA request has been approved. Additional information will be provided in the approval communication. (Message 1145)  DrugPantoprazole Sodium 40MG  dr tablets  Therapist, sports PA Form  Original Claim [id number] QTY LIMIT EXCD PA RQD CALL [id number] MAX QTY OF 90.000 IN 365 DAYSPRIOR AUTHORIZATION EXPIRES ON 12/26/20QUANTITY REMAINING .000NEXT FILL DATE [id number] LAST FILL DATE [id number] LAST FILL AT [facility]

## 2018-04-08 NOTE — Telephone Encounter (Signed)
auth received 04/05/2018 to 04/04/2021

## 2018-04-09 ENCOUNTER — Inpatient Hospital Stay: Payer: 59 | Attending: Hematology & Oncology

## 2018-04-09 ENCOUNTER — Inpatient Hospital Stay: Payer: 59

## 2018-04-09 ENCOUNTER — Other Ambulatory Visit: Payer: Self-pay

## 2018-04-09 VITALS — BP 147/85 | HR 71 | Temp 98.6°F | Resp 19

## 2018-04-09 DIAGNOSIS — C7B02 Secondary carcinoid tumors of liver: Secondary | ICD-10-CM | POA: Insufficient documentation

## 2018-04-09 DIAGNOSIS — C7A00 Malignant carcinoid tumor of unspecified site: Secondary | ICD-10-CM | POA: Insufficient documentation

## 2018-04-09 DIAGNOSIS — C7B8 Other secondary neuroendocrine tumors: Secondary | ICD-10-CM

## 2018-04-09 LAB — CMP (CANCER CENTER ONLY)
ALT: 19 U/L (ref 0–44)
ANION GAP: 11 (ref 5–15)
AST: 13 U/L — ABNORMAL LOW (ref 15–41)
Albumin: 4.2 g/dL (ref 3.5–5.0)
Alkaline Phosphatase: 104 U/L (ref 38–126)
BUN: 23 mg/dL — ABNORMAL HIGH (ref 6–20)
CO2: 23 mmol/L (ref 22–32)
Calcium: 9.3 mg/dL (ref 8.9–10.3)
Chloride: 105 mmol/L (ref 98–111)
Creatinine: 0.68 mg/dL (ref 0.44–1.00)
GFR, Estimated: >60 mL/min (ref >60)
Glucose, Bld: 163 mg/dL — ABNORMAL HIGH (ref 70–99)
Potassium: 4.6 mmol/L (ref 3.5–5.1)
Sodium: 139 mmol/L (ref 135–145)
Total Bilirubin: 0.3 mg/dL (ref 0.3–1.2)
Total Protein: 7.5 g/dL (ref 6.5–8.1)

## 2018-04-09 LAB — CBC WITH DIFFERENTIAL (CANCER CENTER ONLY)
Abs Immature Granulocytes: 0.07 10*3/uL (ref 0.00–0.07)
Basophils Absolute: 0.1 10*3/uL (ref 0.0–0.1)
Basophils Relative: 1 %
Eosinophils Absolute: 0.2 10*3/uL (ref 0.0–0.5)
Eosinophils Relative: 2 %
HCT: 40.2 % (ref 36.0–46.0)
Hemoglobin: 12.9 g/dL (ref 12.0–15.0)
Immature Granulocytes: 1 %
Lymphocytes Relative: 27 %
Lymphs Abs: 2.7 10*3/uL (ref 0.7–4.0)
MCH: 29.5 pg (ref 26.0–34.0)
MCHC: 32.1 g/dL (ref 30.0–36.0)
MCV: 92 fL (ref 80.0–100.0)
MONO ABS: 0.7 10*3/uL (ref 0.1–1.0)
Monocytes Relative: 7 %
NRBC: 0 % (ref 0.0–0.2)
Neutro Abs: 6.5 10*3/uL (ref 1.7–7.7)
Neutrophils Relative %: 62 %
Platelet Count: 273 10*3/uL (ref 150–400)
RBC: 4.37 MIL/uL (ref 3.87–5.11)
RDW: 13.4 % (ref 11.5–15.5)
WBC: 10.2 10*3/uL (ref 4.0–10.5)

## 2018-04-09 MED ORDER — LANREOTIDE ACETATE 120 MG/0.5ML ~~LOC~~ SOLN
SUBCUTANEOUS | Status: AC
  Filled 2018-04-09: qty 120

## 2018-04-09 MED ORDER — LANREOTIDE ACETATE 120 MG/0.5ML ~~LOC~~ SOLN
120.0000 mg | SUBCUTANEOUS | Status: DC
  Administered 2018-04-09: 120 mg via SUBCUTANEOUS

## 2018-04-11 ENCOUNTER — Other Ambulatory Visit: Payer: Self-pay | Admitting: Family Medicine

## 2018-04-12 MED ORDER — VALACYCLOVIR HCL 500 MG PO TABS: ORAL_TABLET | ORAL | 1 refills | Status: DC

## 2018-04-12 NOTE — Addendum Note (Signed)
Addended by: Briscoe Deutscher R on: 04/12/2018 07:10 AM   Modules accepted: Orders

## 2018-04-13 LAB — CHROMOGRANIN A REBASELINE
Chromogranin A (ng/mL): 104.8 ng/mL — ABNORMAL HIGH (ref 0.0–101.8)
Chromogranin A: 2 nmol/L (ref 0–5)

## 2018-04-19 ENCOUNTER — Encounter: Payer: Self-pay | Admitting: Family Medicine

## 2018-04-19 ENCOUNTER — Ambulatory Visit (INDEPENDENT_AMBULATORY_CARE_PROVIDER_SITE_OTHER): Payer: 59 | Admitting: Family Medicine

## 2018-04-19 ENCOUNTER — Ambulatory Visit: Payer: 59 | Admitting: Physician Assistant

## 2018-04-19 VITALS — BP 110/64 | HR 85 | Temp 98.8°F | Ht 59.0 in | Wt 236.0 lb

## 2018-04-19 DIAGNOSIS — J029 Acute pharyngitis, unspecified: Secondary | ICD-10-CM | POA: Diagnosis not present

## 2018-04-19 DIAGNOSIS — R059 Cough, unspecified: Secondary | ICD-10-CM

## 2018-04-19 DIAGNOSIS — R6889 Other general symptoms and signs: Secondary | ICD-10-CM | POA: Diagnosis not present

## 2018-04-19 DIAGNOSIS — R05 Cough: Secondary | ICD-10-CM

## 2018-04-19 DIAGNOSIS — J329 Chronic sinusitis, unspecified: Secondary | ICD-10-CM

## 2018-04-19 LAB — POCT RAPID STREP A (OFFICE): Rapid Strep A Screen: NEGATIVE

## 2018-04-19 LAB — POCT INFLUENZA A/B
Influenza A, POC: NEGATIVE
Influenza B, POC: NEGATIVE

## 2018-04-19 MED ORDER — IPRATROPIUM BROMIDE 0.06 % NA SOLN: 2.0000 | Freq: Four times a day (QID) | NASAL | 0 refills | Status: DC

## 2018-04-19 MED ORDER — AMOXICILLIN-POT CLAVULANATE 875-125 MG PO TABS: 1.0000 | ORAL_TABLET | Freq: Two times a day (BID) | ORAL | 0 refills | Status: DC

## 2018-04-19 MED ORDER — BENZONATATE 200 MG PO CAPS: 200.0000 mg | ORAL_CAPSULE | Freq: Two times a day (BID) | ORAL | 0 refills | Status: DC | PRN

## 2018-04-19 NOTE — Progress Notes (Signed)
   Chief Complaint:  Leah [name] is a 58 y.o. female who presents for same day appointment with a chief complaint of sore throat.   Assessment/Plan:  Cough / Sinusitis  Likely secondary to viral URI.  Rapid strep and flu negative.  No signs of bacterial infection. Start atrovent for rhinorrhea/sinus congestion. Start Tessalon for cough. Sent in a "pocket prescription" for Augmentin with strict instruction to not start unless symptoms worsen or fail to improve within the next several days. Recommended tylenol and/or motrin as needed for low grade fever and pain. Encouraged good oral hydration. Return precautions reviewed. Follow up as needed.      Subjective:  HPI:  Sore Throat, acute problem Started 2 days ago. Associated with cough, shortness of breath, fever, and nasal congestion. Symptoms seem to be worsening. No treatments tried. Son has been sick with similar symptoms. No other obvious alleviating or aggravating factors.   ROS: Per HPI  PMH: She reports that she has never smoked. She has never used smokeless tobacco. She reports that she does not drink alcohol or use drugs.      Objective:  Physical Exam: BP 110/64 (BP Location: Left Arm, Patient Position: Sitting, Cuff Size: Large)   Pulse 85   Temp 98.8 F (37.1 C) (Oral)   Ht 4\' 11"  (1.499 m)   Wt 236 lb (107 kg)   SpO2 97%   BMI 47.67 kg/m   Gen: NAD, resting comfortably HEENT: TMs with clear effusion.  OP erythematous with no exudate.  Nasal mucosa erythematous and boggy bilaterally. CV: Regular rate and rhythm with no murmurs appreciated Pulm: Normal work of breathing, clear to auscultation bilaterally with no crackles, wheezes, or rhonchi     Caleb M. Jerline Pain, MD 04/19/2018 1:37 PM

## 2018-04-19 NOTE — Patient Instructions (Signed)
Start the atrovent.  Start tessalon for your cough.  Start the augmentin if your symptoms worsen or do not improve in a few days.  Please stay well hydrated.  You can take tylenol and/or motrin as needed for low grade fever and pain.  Please let me know if your symptoms worsen or fail to improve.  Take care, Dr Jerline Pain

## 2018-04-24 ENCOUNTER — Encounter: Payer: Self-pay | Admitting: Family Medicine

## 2018-04-27 ENCOUNTER — Ambulatory Visit: Payer: Self-pay | Admitting: *Deleted

## 2018-04-27 NOTE — Telephone Encounter (Signed)
See note

## 2018-04-27 NOTE — Telephone Encounter (Signed)
Called patient she will go to urgent care today to be evaluated

## 2018-04-27 NOTE — Telephone Encounter (Signed)
Called patient states she spoke with someone at [facility] and note has been sent to [place] for review.

## 2018-04-27 NOTE — Telephone Encounter (Signed)
Please advise 

## 2018-04-27 NOTE — Telephone Encounter (Signed)
04/19/18 seen by Dr. Jerline Pain with flu-like sx and a diagnosis of viral uri-prescribed Augmentin 875-125 MG 2 tabs daily for 7 days. She is a cancer patient and receives monthly Octreotide injections at her cancer center. She spoke with her oncologist-surgeon today who cancelled this week's treatment and because she continues to have lingering viral symptoms-to call PCP to be corona tested before she can return to the treatment facility. Reports intermittent fevers up to 102 oral which was last night. None today. Continues to have intermittent headaches/dry cough/runny nose. Denies N/V/CP/SOB/dizziness. Using flonase daily, encouraged increased water intake and continued rest. Reviewed symptoms that would require immediate evaluation if occurred. Routing to PCP for further advice/lab order for corona testing[facility] [address] [facility].  Reason for Disposition . [1] Fever > 100.0 F (37.8 C) AND [2] diabetes mellitus or weak immune system (e.g., HIV positive, cancer chemo, splenectomy, organ transplant, chronic steroids)  Answer Assessment - Initial Assessment Questions 1. ONSET: "When did the cough begin?"      Lingering cough for 2 weeks. Mostly dry. Clear to whitish phlegm when it comes up. 2. SEVERITY: "How bad is the cough today?"      Today the cough is a tickle or drainage. 3. RESPIRATORY DISTRESS: "Describe your breathing."      "feels labored today". No wheezing. Mouth feels very dry. 4. FEVER: "Do you have a fever?" If so, ask: "What is your temperature, how was it measured, and when did it start?"     Last temperature check was last night it was 100 or 101 oral. 5. HEMOPTYSIS: "Are you coughing up any blood?" If so ask: "How much?" (flecks, streaks, tablespoons, etc.)     no 6. TREATMENT: "What have you done so far to treat the cough?" (e.g., meds, fluids, humidifier)     Honey cough drops. Loaned out her humidifier.drinking approximately 6-8 daily. 7. CARDIAC HISTORY: "Do  you have any history of heart disease?" (e.g., heart attack, congestive heart failure)      htn 8. LUNG HISTORY: "Do you have any history of lung disease?"  (e.g., pulmonary embolus, asthma, emphysema)     none 9. PE RISK FACTORS: "Do you have a history of blood clots?" (or: recent major surgery, recent prolonged travel, bedridden)     none 10. OTHER SYMPTOMS: "Do you have any other symptoms? (e.g., runny nose, wheezing, chest pain)       Both runny nose and nasal congestion. No sinus pressure or pain around her eyes. 11. PREGNANCY: "Is there any chance you are pregnant?" "When was your last menstrual period?"       no 12. TRAVEL: "Have you traveled out of the country in the last month?" (e.g., travel history, exposures)       no  Protocols used: COUGH - ACUTE NON-PRODUCTIVE-A-AH

## 2018-04-27 NOTE — Telephone Encounter (Signed)
Patient needs COVID testing BUT also needs to be evaluated by provider. High risk for complications like PNA.

## 2018-05-02 ENCOUNTER — Telehealth: Payer: Self-pay

## 2018-05-02 NOTE — Telephone Encounter (Signed)
Okay to call in zpak and tell her to continue to monitor. I think that anyone with a laptop can do a video visit - we will need to go over with Lea and others to confirm  And give step by step information to patients.

## 2018-05-02 NOTE — Telephone Encounter (Signed)
Called patient to move out CPE. On 3/17 the office she went to was told that see could not be seen due to over crowded. She was going to drive in testing and was told that they are out of supplies and would not be able to test anyone. She does not have access to web cam to do e visit. She is still having cough and some SOB. She is able to get deep breath in. She was able to have phone conversation with no major problems. She can not tell me if she has had fever because she has been taking mediation. She feels more "presure in chest" than sob. She is not felling any worse at this time. She has not noticed improvement but can tell it has not gotten worse.   Let patient know red words and to increase fluid and we will call when we get answer from you.

## 2018-05-03 ENCOUNTER — Other Ambulatory Visit: Payer: Self-pay

## 2018-05-03 MED ORDER — AZITHROMYCIN 250 MG PO TABS: ORAL_TABLET | ORAL | 0 refills | Status: DC

## 2018-05-03 NOTE — Telephone Encounter (Signed)
Have called patient called in meds added to schedule tomorrow can you set up for webex visit patient aware of how to get app and long on and of app time/date.

## 2018-05-04 ENCOUNTER — Encounter: Payer: Self-pay | Admitting: Family Medicine

## 2018-05-04 ENCOUNTER — Ambulatory Visit (INDEPENDENT_AMBULATORY_CARE_PROVIDER_SITE_OTHER): Payer: 59 | Admitting: Family Medicine

## 2018-05-04 VITALS — Temp 99.0°F

## 2018-05-04 DIAGNOSIS — Z9989 Dependence on other enabling machines and devices: Secondary | ICD-10-CM

## 2018-05-04 DIAGNOSIS — J329 Chronic sinusitis, unspecified: Secondary | ICD-10-CM

## 2018-05-04 DIAGNOSIS — G4733 Obstructive sleep apnea (adult) (pediatric): Secondary | ICD-10-CM | POA: Diagnosis not present

## 2018-05-04 DIAGNOSIS — D869 Sarcoidosis, unspecified: Secondary | ICD-10-CM

## 2018-05-04 DIAGNOSIS — B9689 Other specified bacterial agents as the cause of diseases classified elsewhere: Secondary | ICD-10-CM

## 2018-05-04 DIAGNOSIS — R911 Solitary pulmonary nodule: Secondary | ICD-10-CM | POA: Insufficient documentation

## 2018-05-04 DIAGNOSIS — J208 Acute bronchitis due to other specified organisms: Secondary | ICD-10-CM

## 2018-05-04 MED ORDER — HYDROCOD POLST-CPM POLST ER 10-8 MG/5ML PO SUER: 5.0000 mL | Freq: Every evening | ORAL | 0 refills | Status: DC | PRN

## 2018-05-04 MED ORDER — PREDNISONE 5 MG PO TABS: 5.0000 mg | ORAL_TABLET | Freq: Every day | ORAL | 0 refills | Status: DC

## 2018-05-04 NOTE — Progress Notes (Signed)
Virtual Visit via Video   I connected with Leah [name] on 05/04/18 at 10:40 AM EDT by a video enabled telemedicine application and verified that I am speaking with the correct person using two identifiers. Location patient: Home Location provider: [facility], Office Persons participating in the virtual visit: Lilo Wallington, Dr. Juleen China, Briscoe Deutscher, DO   I discussed the limitations of evaluation and management by telemedicine and the availability of in person appointments. The patient expressed understanding and agreed to proceed.  Subjective:  Leah [name], CMA acting as scribe for Dr. Briscoe Deutscher.   HPI: On 3/17 the office she went to was told that see could not be seen due to over crowded. She was going to drive in testing and was told that they are out of supplies and would not be able to test anyone. She does not have access to web cam to do e visit. She is still having cough and some SOB. She is able to get deep breath in. She was able to have phone conversation with no major problems. She can not tell me if she has had fever because she has been taking mediation. She feels more "presure in chest" than sob. She is not felling any worse at this time. She has not noticed improvement but can tell it has not gotten worse.   Symptoms started on 3/7. They have progressed and at times gotten worse. She is having heavy chest and some sob. A lot of nasal congestion. Son also has very deep cough, his girl friend also has been sick. Oncology did not want her seen until tested. She tried to go to drive through testing. She will have follow up with oncology in June due to symptoms and Covid.   She was given antibiotics but did not help much. We have called Z pack  last night for patient last night but she has not been able to start yet. She will pick up today.     ROS: See pertinent positives and negatives per HPI.  Patient Active Problem List   Diagnosis Date Noted  . OSA on CPAP  05/04/2018  . Solitary pulmonary nodule 05/04/2018  . History of prophylactic mastectomy of both breasts 11/04/2017  . Atrophic vaginitis 04/12/2016  . Osteopenia 09/27/2015  . Branch retinal vein occlusion of left eye 12/12/2014  . Hypertensive retinopathy of both eyes 12/12/2014  . Lung nodule seen on imaging study 05/01/2014  . Diabetes mellitus type 2, controlled ([place]) 03/17/2013  . Morbid obesity ([place]) 03/01/2013  . Carcinoid syndrome ([place]) 02/21/2013  . Retinopathy 01/18/2013  . 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 Malignant neoplasm of breast ([place]) 01/08/2011  . 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 ([place]) 10/22/2010  . Hypertension associated with diabetes ([place]) 10/22/2010  . GERD (gastroesophageal reflux disease) 10/22/2010  . Allergic rhinitis 10/22/2010  . Sarcoidosis 10/22/2010  . Osteoarthritis 10/22/2010  . Anxiety 10/22/2010    Social History   Tobacco Use  . Smoking status: Never Smoker  . Smokeless tobacco: Never Used  . Tobacco comment: never used tobacco  Substance Use Topics  . Alcohol use: No    Alcohol/week: 0.0 standard drinks    Current Outpatient Medications:  .  Acyclovir-Hydrocortisone (XERESE) 5-1 % CREA, APPLY TO EFFECTED AREA, Disp: 5 g, Rfl: 0 .  amLODipine (NORVASC) 10 MG tablet, Take 1 tablet (10 mg total) by mouth daily., Disp: 90 tablet, Rfl: 0 .  amoxicillin-clavulanate (AUGMENTIN) 875-125 MG tablet, Take 1 tablet by mouth 2 (two)  times daily., Disp: 14 tablet, Rfl: 0 .  aspirin 81 MG EC tablet, TAKE 1 TABLET (81 MG TOTAL) BY MOUTH DAILY., Disp: 90 tablet, Rfl: 0 .  azithromycin (ZITHROMAX) 250 MG tablet, Two tablets on day one then one a day after., Disp: 6 tablet, Rfl: 0 .  benzonatate (TESSALON) 200 MG capsule, Take 1 capsule (200 mg total) by mouth 2 (two) times daily as needed for cough., Disp: 20 capsule, Rfl: 0 .  clonazePAM (KLONOPIN) 0.5 MG tablet, TAKE 1 TABLET BY MOUTH TWICE A DAY AS NEEDED FOR ANXIETY., Disp: 30 tablet, Rfl:  1 .  fexofenadine (ALLEGRA) 180 MG tablet, Take 1 tablet (180 mg total) by mouth daily., Disp: 90 tablet, Rfl: 3 .  fluticasone (FLONASE) 50 MCG/ACT nasal spray, USE 2 SPRAYS IN BOTH NOSTRILS EVERY DAY (Patient taking differently: Place 2 sprays into both nostrils daily. ), Disp: 16 g, Rfl: 2 .  gabapentin (NEURONTIN) 100 MG capsule, TAKE 1 CAPSULE (100 MG TOTAL) BY MOUTH AT BEDTIME., Disp: 90 capsule, Rfl: 0 .  ipratropium (ATROVENT) 0.06 % nasal spray, Place 2 sprays into both nostrils 4 (four) times daily., Disp: 15 mL, Rfl: 0 .  metoprolol succinate (TOPROL-XL) 100 MG 24 hr tablet, TAKE 1 TABLET BY MOUTH EVERY EVENING WITH OR IMMEDIATELY FOLLOWING A MEAL, Disp: 90 tablet, Rfl: 1 .  montelukast (SINGULAIR) 10 MG tablet, TAKE 1 TABLET BY MOUTH EVERYDAY AT BEDTIME, Disp: 90 tablet, Rfl: 0 .  octreotide (SANDOSTATIN LAR) 30 MG injection, Inject 30 mg into the muscle every 28 (twenty-eight) days., Disp: , Rfl:  .  ondansetron (ZOFRAN-ODT) 4 MG disintegrating tablet, DISSOLVE 1 TABLET BY MOUTH EVERY 8 HOURS AS NEEDED FOR NAUSEA AND VOMITING, Disp: 90 tablet, Rfl: 0 .  Ospemifene (OSPHENA) 60 MG TABS, Take 1 tablet by mouth daily., Disp: 90 tablet, Rfl: 3 .  pantoprazole (PROTONIX) 40 MG tablet, TAKE 1 TABLET BY MOUTH TWICE A DAY (Patient taking differently: Take 40 mg by mouth 2 (two) times daily. ), Disp: 180 tablet, Rfl: 0 .  Phentermine-Topiramate (QSYMIA) 7.5-46 MG CP24, Take 1 capsule by mouth daily., Disp: 30 capsule, Rfl: 1 .  PROAIR HFA 108 (90 Base) MCG/ACT inhaler, INHALE 2 PUFFS INTO THE LUNGS EVERY 6 (SIX) HOURS AS NEEDED. FOR SHORTNESS OF BREATH. (Patient taking differently: Inhale 2 puffs into the lungs every 6 (six) hours as needed for wheezing or shortness of breath. ), Disp: 6.7 Inhaler, Rfl: 4 .  valACYclovir (VALTREX) 500 MG tablet, TAKE 1 TABLET BY MOUTH TWICE A DAY AS NEEDED FOR COLD SORES, Disp: 180 tablet, Rfl: 1 .  valsartan (DIOVAN) 320 MG tablet, TAKE 1 TABLET (320 MG TOTAL) BY  MOUTH DAILY., Disp: 90 tablet, Rfl: 1 .  venlafaxine XR (EFFEXOR-XR) 150 MG 24 hr capsule, TAKE 1 CAPSULE (150 MG TOTAL) BY MOUTH EACH MORNING, Disp: 90 capsule, Rfl: 1  No Known Allergies  Objective:   VITALS: Per patient if applicable, see vitals. GENERAL: Alert, appears well and in no acute distress. HEENT: Atraumatic, conjunctiva clear, no obvious abnormalities on inspection of external nose and ears. NECK: Normal movements of the head and neck. CARDIOPULMONARY: No increased WOB. Speaking in clear sentences. I:E ratio WNL.  MS: Moves all visible extremities without noticeable abnormality. PSYCH: Pleasant and cooperative, well-groomed. Speech normal rate and rhythm. Affect is appropriate. Insight and judgement are appropriate. Attention is focused, linear, and appropriate.  NEURO: CN grossly intact. Oriented as arrived to appointment on time with no prompting. Moves both UE equally.  SKIN: No obvious lesions, wounds, erythema, or cyanosis noted on face or hands.  Assessment and Plan:   Dulcy was seen today for cough.  Diagnoses and all orders for this visit:  Sarcoidosis  OSA on CPAP  Solitary pulmonary nodule  Bacterial sinusitis -     predniSONE (DELTASONE) 5 MG tablet; Take 1 tablet (5 mg total) by mouth daily with breakfast. 6-5-4-3-2-1  Acute bacterial bronchitis -     chlorpheniramine-HYDROcodone (TUSSIONEX PENNKINETIC ER) 10-8 MG/5ML SUER; Take 5 mLs by mouth at bedtime as needed for cough.    . Reviewed expectations re: course of current medical issues. . Discussed self-management of symptoms. . Outlined signs and symptoms indicating need for more acute intervention. . Patient verbalized understanding and all questions were answered. Marland Kitchen Health Maintenance issues including appropriate healthy diet, exercise, and smoking avoidance were discussed with patient. . See orders for this visit as documented in the electronic medical record.  Briscoe Deutscher,  DO 05/04/2018

## 2018-05-07 ENCOUNTER — Ambulatory Visit: Payer: 59 | Admitting: Family Medicine

## 2018-05-11 ENCOUNTER — Other Ambulatory Visit: Payer: Self-pay | Admitting: Family Medicine

## 2018-05-13 ENCOUNTER — Other Ambulatory Visit: Payer: Self-pay | Admitting: Family Medicine

## 2018-05-13 DIAGNOSIS — J329 Chronic sinusitis, unspecified: Principal | ICD-10-CM

## 2018-05-13 DIAGNOSIS — B9689 Other specified bacterial agents as the cause of diseases classified elsewhere: Secondary | ICD-10-CM

## 2018-05-14 ENCOUNTER — Inpatient Hospital Stay: Payer: 59 | Attending: Hematology & Oncology

## 2018-05-14 ENCOUNTER — Inpatient Hospital Stay: Payer: 59 | Admitting: Hematology & Oncology

## 2018-05-14 ENCOUNTER — Other Ambulatory Visit: Payer: Self-pay

## 2018-05-14 ENCOUNTER — Inpatient Hospital Stay: Payer: 59

## 2018-05-14 DIAGNOSIS — C7B02 Secondary carcinoid tumors of liver: Secondary | ICD-10-CM | POA: Insufficient documentation

## 2018-05-14 DIAGNOSIS — C7A00 Malignant carcinoid tumor of unspecified site: Secondary | ICD-10-CM | POA: Insufficient documentation

## 2018-05-14 MED ORDER — LANREOTIDE ACETATE 120 MG/0.5ML ~~LOC~~ SOLN
SUBCUTANEOUS | Status: AC
  Filled 2018-05-14: qty 120

## 2018-05-18 ENCOUNTER — Telehealth: Payer: Self-pay | Admitting: Family

## 2018-05-18 ENCOUNTER — Other Ambulatory Visit: Payer: Self-pay

## 2018-05-18 ENCOUNTER — Inpatient Hospital Stay (HOSPITAL_BASED_OUTPATIENT_CLINIC_OR_DEPARTMENT_OTHER): Payer: 59 | Admitting: Family

## 2018-05-18 ENCOUNTER — Encounter: Payer: Self-pay | Admitting: Family

## 2018-05-18 ENCOUNTER — Inpatient Hospital Stay: Payer: 59

## 2018-05-18 VITALS — BP 139/103 | HR 86 | Temp 98.2°F | Resp 18 | Ht 59.0 in | Wt 222.8 lb

## 2018-05-18 DIAGNOSIS — C7B8 Other secondary neuroendocrine tumors: Secondary | ICD-10-CM

## 2018-05-18 DIAGNOSIS — C7A00 Malignant carcinoid tumor of unspecified site: Secondary | ICD-10-CM

## 2018-05-18 DIAGNOSIS — C7B02 Secondary carcinoid tumors of liver: Secondary | ICD-10-CM | POA: Diagnosis not present

## 2018-05-18 LAB — CBC WITH DIFFERENTIAL (CANCER CENTER ONLY)
Abs Immature Granulocytes: 0.04 10*3/uL (ref 0.00–0.07)
Basophils Absolute: 0.1 10*3/uL (ref 0.0–0.1)
Basophils Relative: 1 %
Eosinophils Absolute: 0.2 10*3/uL (ref 0.0–0.5)
Eosinophils Relative: 2 %
HCT: 41.3 % (ref 36.0–46.0)
Hemoglobin: 13.2 g/dL (ref 12.0–15.0)
Immature Granulocytes: 0 %
Lymphocytes Relative: 29 %
Lymphs Abs: 3 10*3/uL (ref 0.7–4.0)
MCH: 29.7 pg (ref 26.0–34.0)
MCHC: 32 g/dL (ref 30.0–36.0)
MCV: 93 fL (ref 80.0–100.0)
Monocytes Absolute: 0.7 10*3/uL (ref 0.1–1.0)
Monocytes Relative: 6 %
Neutro Abs: 6.4 10*3/uL (ref 1.7–7.7)
Neutrophils Relative %: 62 %
Platelet Count: 307 10*3/uL (ref 150–400)
RBC: 4.44 MIL/uL (ref 3.87–5.11)
RDW: 13.9 % (ref 11.5–15.5)
WBC Count: 10.4 10*3/uL (ref 4.0–10.5)
nRBC: 0 % (ref 0.0–0.2)

## 2018-05-18 LAB — CMP (CANCER CENTER ONLY)
ALT: 20 U/L (ref 0–44)
AST: 14 U/L — ABNORMAL LOW (ref 15–41)
Albumin: 4.1 g/dL (ref 3.5–5.0)
Alkaline Phosphatase: 96 U/L (ref 38–126)
Anion gap: 9 (ref 5–15)
BUN: 16 mg/dL (ref 6–20)
CO2: 26 mmol/L (ref 22–32)
Calcium: 9.2 mg/dL (ref 8.9–10.3)
Chloride: 108 mmol/L (ref 98–111)
Creatinine: 0.73 mg/dL (ref 0.44–1.00)
GFR, Est AFR Am: >60 mL/min (ref >60)
GFR, Estimated: >60 mL/min (ref >60)
Glucose, Bld: 188 mg/dL — ABNORMAL HIGH (ref 70–99)
Potassium: 4.3 mmol/L (ref 3.5–5.1)
Sodium: 143 mmol/L (ref 135–145)
Total Bilirubin: 0.5 mg/dL (ref 0.3–1.2)
Total Protein: 7.3 g/dL (ref 6.5–8.1)

## 2018-05-18 MED ORDER — LANREOTIDE ACETATE 120 MG/0.5ML ~~LOC~~ SOLN
120.0000 mg | SUBCUTANEOUS | Status: DC
  Administered 2018-05-18: 120 mg via SUBCUTANEOUS

## 2018-05-18 MED ORDER — LANREOTIDE ACETATE 120 MG/0.5ML ~~LOC~~ SOLN
SUBCUTANEOUS | Status: AC
  Filled 2018-05-18: qty 120

## 2018-05-18 NOTE — Telephone Encounter (Signed)
Appointments scheduled letter/calendar mailed per 4/7 los

## 2018-05-18 NOTE — Progress Notes (Signed)
Hematology and Oncology Follow Up Visit  Leah [name] [id number] 09/24/1960 58 y.o. 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 She has been under a great deal of stress with Covid-19 and her grandson was born over the weekend and is in the NICU on a ventilator.  Chromogranin A in February was 2.  She has had no fever, chills, n/v, cough, rash, dizziness, SOB, chest pain, palpitations, abdominal pain or changes in bowel or bladder habits.  She will be having a repeat MRI of the liver and follow-up with Dr. Crisoforo Oxford the first week of May.  No swelling, tenderness, numbness or tingling in her extremities at this time.  No lymphadenopathy noted on exam.  No episodes of bleeding, no bruising or petechiae.  She has maintained a good appetite and is staying well hydrated. Her weight is stable.   ECOG Performance Status: 1 - Symptomatic but completely ambulatory  Medications:  Allergies as of 05/18/2018   No Known Allergies     Medication List       Accurate as of May 18, 2018 11:44 AM. Always use your most recent med list.        Acyclovir-Hydrocortisone 5-1 % Crea Commonly known as:  Fatima Sanger APPLY TO EFFECTED AREA   amLODipine 10 MG tablet Commonly known as:  NORVASC Take 1 tablet (10 mg total) by mouth daily.   aspirin 81 MG EC tablet TAKE 1 TABLET (81 MG TOTAL) BY MOUTH DAILY.   azithromycin 250 MG tablet Commonly known as:  ZITHROMAX Two tablets on day one then one a day after.   benzonatate 200 MG capsule Commonly known as:  TESSALON Take 1 capsule (200 mg total) by mouth 2 (two) times daily as needed for cough.   chlorpheniramine-HYDROcodone 10-8 MG/5ML Suer Commonly known as:  Tussionex Pennkinetic ER Take 5 mLs by mouth  at bedtime as needed for cough.   clonazePAM 0.5 MG tablet Commonly known as:  KLONOPIN TAKE 1 TABLET BY MOUTH TWICE A DAY AS NEEDED FOR ANXIETY.   fexofenadine 180 MG tablet Commonly known as:  ALLEGRA Take 1 tablet (180 mg total) by mouth daily.   fluticasone 50 MCG/ACT nasal spray Commonly known as:  FLONASE USE 2 SPRAYS IN BOTH NOSTRILS EVERY DAY   gabapentin 100 MG capsule Commonly known as:  NEURONTIN TAKE 1 CAPSULE (100 MG TOTAL) BY MOUTH AT BEDTIME.   ipratropium 0.06 % nasal spray Commonly known as:  ATROVENT PLACE 2 SPRAYS INTO BOTH NOSTRILS 4 (FOUR) TIMES DAILY.   metoprolol succinate 100 MG 24 hr tablet Commonly known as:  TOPROL-XL TAKE 1 TABLET BY MOUTH EVERY EVENING WITH OR IMMEDIATELY FOLLOWING A MEAL   montelukast 10 MG tablet Commonly known as:  SINGULAIR TAKE 1 TABLET BY MOUTH EVERYDAY AT BEDTIME   octreotide 30 MG injection Commonly known as:  SANDOSTATIN LAR Inject 30 mg into the muscle every 28 (twenty-eight) days.   ondansetron 4 MG disintegrating tablet Commonly known as:  ZOFRAN-ODT DISSOLVE 1 TABLET BY MOUTH EVERY 8 HOURS AS NEEDED FOR NAUSEA AND VOMITING   Ospemifene 60 MG Tabs Commonly known as:  Osphena Take 1 tablet by mouth daily.   pantoprazole 40 MG tablet Commonly known as:  PROTONIX TAKE 1 TABLET BY MOUTH TWICE A DAY   Phentermine-Topiramate  7.5-46 MG Cp24 Commonly known as:  Qsymia Take 1 capsule by mouth daily.   predniSONE 5 MG tablet Commonly known as:  DELTASONE Take 1 tablet (5 mg total) by mouth daily with breakfast. 6-5-4-3-2-1   ProAir HFA 108 (90 Base) MCG/ACT inhaler Generic drug:  albuterol INHALE 2 PUFFS INTO THE LUNGS EVERY 6 (SIX) HOURS AS NEEDED. FOR SHORTNESS OF BREATH.   valACYclovir 500 MG tablet Commonly known as:  VALTREX TAKE 1 TABLET BY MOUTH TWICE A DAY AS NEEDED FOR COLD SORES   valsartan 320 MG tablet Commonly known as:  DIOVAN TAKE 1 TABLET (320 MG TOTAL) BY MOUTH DAILY.   venlafaxine XR  150 MG 24 hr capsule Commonly known as:  EFFEXOR-XR TAKE 1 CAPSULE (150 MG TOTAL) BY MOUTH EACH MORNING       Allergies: No Known Allergies  Past Medical History, Surgical history, Social history, and Family History were reviewed and updated.  Review of Systems: All other 10 point review of systems is negative.   Physical Exam:  vitals were not taken for this visit.   Wt Readings from Last 3 Encounters:  04/19/18 236 lb (107 kg)  02/12/18 222 lb (100.7 kg)  01/15/18 212 lb 9 oz (96.4 kg)    Ocular: Sclerae unicteric, pupils equal, round and reactive to light Ear-nose-throat: Oropharynx clear, dentition fair Lymphatic: No cervical or supraclavicular adenopathy Lungs no rales or rhonchi, good excursion bilaterally Heart regular rate and rhythm, no murmur appreciated Abd soft, nontender, positive bowel sounds, no liver or spleen tip palpated on exam, no fluid wave  MSK no focal spinal tenderness, no joint edema Neuro: non-focal, well-oriented, appropriate affect Breasts: Deferred   Lab Results  Component Value Date   WBC 10.2 04/09/2018   HGB 12.9 04/09/2018   HCT 40.2 04/09/2018   MCV 92.0 04/09/2018   PLT 273 04/09/2018   Lab Results  Component Value Date   FERRITIN 382 (H) 10/18/2008   IRON <10 (L) 10/18/2008   TIBC NOT CALC Not calculated due to Iron <10. 10/18/2008   UIBC 156 10/18/2008   IRONPCTSAT NOT CALC Not calculated due to Iron <10. 10/18/2008   Lab Results  Component Value Date   RETICCTPCT 0.9 10/18/2008   RBC 4.37 04/09/2018   No results found for: KPAFRELGTCHN, LAMBDASER, KAPLAMBRATIO No results found for: Kandis Cocking, IGMSERUM No results found for: Odetta Pink, SPEI   Chemistry      Component Value Date/Time   NA 139 04/09/2018 1144   NA 140 01/20/2017 1416   NA 141 01/17/2016 1047   K 4.6 04/09/2018 1144   K 3.9 01/20/2017 1416   K 3.2 (L) 01/17/2016 1047   CL 105 04/09/2018 1144    CL 102 01/20/2017 1416   CO2 23 04/09/2018 1144   CO2 26 01/20/2017 1416   CO2 27 01/17/2016 1047   BUN 23 (H) 04/09/2018 1144   BUN 15 01/20/2017 1416   BUN 6.6 (L) 01/17/2016 1047   CREATININE 0.68 04/09/2018 1144   CREATININE 1.1 01/20/2017 1416   CREATININE 0.7 01/17/2016 1047      Component Value Date/Time   CALCIUM 9.3 04/09/2018 1144   CALCIUM 9.1 01/20/2017 1416   CALCIUM 8.7 01/17/2016 1047   ALKPHOS 104 04/09/2018 1144   ALKPHOS 84 01/20/2017 1416   ALKPHOS 90 01/17/2016 1047   AST 13 (L) 04/09/2018 1144   AST 34 01/17/2016 1047   ALT 19 04/09/2018 1144   ALT 35 01/20/2017  1416   ALT 38 01/17/2016 1047   BILITOT 0.3 04/09/2018 1144   BILITOT 0.42 01/17/2016 1047      Impression and Plan: Leah [name] is a very pleasant 58 yo caucasian female with long standing history of metastatic carcinoid with recurrent liver metastasis.  She will have her repeat MRI of the liver and follow-up with Dr. Crisoforo Oxford the first week of May.  She received her Somatuline injection today. She will stay on her monthly injection schedule and we will see her back in another 3 months for MD follow-up.  She will contact our office with any questions or concerns. We can certainly see her sooner if need be.   Laverna Peace, NP 4/7/202011:44 AM

## 2018-05-18 NOTE — Patient Instructions (Signed)
Lanreotide injection What is this medicine? LANREOTIDE (lan REE oh tide) is used to reduce blood levels of growth hormone in patients with a condition called acromegaly. It also works to slow or stop tumor growth in patients with neuroendocrine tumors and treat carcinoid syndrome. This medicine may be used for other purposes; ask your health care provider or pharmacist if you have questions. COMMON BRAND NAME(S): Somatuline [place] What should I tell my health care provider before I take this medicine? They need to know if you have any of these conditions: -diabetes -gallbladder disease -heart disease -kidney disease -liver disease -thyroid disease -an unusual or allergic reaction to lanreotide, other medicines, foods, dyes, or preservatives -pregnant or trying to get pregnant -breast-feeding How should I use this medicine? This medicine is for injection under the skin. It is given by a health care professional in a hospital or clinic setting. Contact your pediatrician or health care professional regarding the use of this medicine in children. Special care may be needed. Overdosage: If you think you have taken too much of this medicine contact a poison control center or emergency room at once. NOTE: This medicine is only for you. Do not share this medicine with others. What if I miss a dose? It is important not to miss your dose. Call your doctor or health care professional if you are unable to keep an appointment. What may interact with this medicine? This medicine may interact with the following medications: -bromocriptine -cyclosporine -certain medicines for blood pressure, heart disease, irregular heart beat -certain medicines for diabetes -quinidine -terfenadine This list may not describe all possible interactions. Give your health care provider a list of all the medicines, herbs, non-prescription drugs, or dietary supplements you use. Also tell them if you smoke, drink alcohol, or  use illegal drugs. Some items may interact with your medicine. What should I watch for while using this medicine? Tell your doctor or healthcare professional if your symptoms do not start to get better or if they get worse. Visit your doctor or health care professional for regular checks on your progress. Your condition will be monitored carefully while you are receiving this medicine. You may need blood work done while you are taking this medicine. Women should inform their doctor if they wish to become pregnant or think they might be pregnant. There is a potential for serious side effects to an unborn child. Talk to your health care professional or pharmacist for more information. Do not breast-feed an infant while taking this medicine or for 6 months after stopping it. This medicine has caused ovarian failure in some women. This medicine may interfere with the ability to have a child. Talk with your doctor or health care professional if you are concerned about your fertility. What side effects may I notice from receiving this medicine? Side effects that you should report to your doctor or health care professional as soon as possible: -allergic reactions like skin rash, itching or hives, swelling of the face, lips, or tongue -increased blood pressure -severe stomach pain -signs and symptoms of high blood sugar such as dizziness; dry mouth; dry skin; fruity breath; nausea; stomach pain; increased hunger or thirst; increased urination -signs and symptoms of low blood sugar such as feeling anxious; confusion; dizziness; increased hunger; unusually weak or tired; sweating; shakiness; cold; irritable; headache; blurred vision; fast heartbeat; loss of consciousness -unusually slow heartbeat Side effects that usually do not require medical attention (report to your doctor or health care professional if they continue   anxious; confusion; dizziness; increased hunger; unusually weak or tired; sweating; shakiness; cold; irritable; headache; blurred vision; fast heartbeat; loss of consciousness  -unusually slow heartbeat  Side effects that usually do not require medical attention (report to your doctor or health care professional if they continue or are bothersome):  -constipation  -diarrhea  -dizziness  -headache  -muscle pain  -muscle  spasms  -nausea  -pain, redness, or irritation at site where injected  This list may not describe all possible side effects. Call your doctor for medical advice about side effects. You may report side effects to FDA at 1-800-FDA-1088.  Where should I keep my medicine?  This drug is given in a hospital or clinic and will not be stored at home.  NOTE: This sheet is a summary. It may not cover all possible information. If you have questions about this medicine, talk to your doctor, pharmacist, or health care provider.   2019 Elsevier/Gold Standard (2015-11-02 10:33:47)

## 2018-05-19 LAB — CHROMOGRANIN A: Chromogranin A (ng/mL): 83.2 ng/mL (ref 0.0–101.8)

## 2018-05-21 ENCOUNTER — Encounter: Payer: Self-pay | Admitting: Family Medicine

## 2018-05-23 MED ORDER — CEFDINIR 300 MG PO CAPS: 300.0000 mg | ORAL_CAPSULE | Freq: Two times a day (BID) | ORAL | 0 refills | Status: AC

## 2018-05-24 ENCOUNTER — Telehealth: Payer: Self-pay | Admitting: Family Medicine

## 2018-05-24 NOTE — Telephone Encounter (Signed)
Spoke with patient. She reports slight improvement in sx since starting Cefdinir yesterday. She will contact the office if sx worsen or stop improving.   Forwarding to Dr. Juleen China as Juluis Rainier - looks like this was addressed in MyChart message.

## 2018-05-24 NOTE — Telephone Encounter (Signed)
[facility] Patient Name: Leah [name] Gender: Female DOB: 17-Aug-1960  Age: 58 Y 2 M 12 D Return Phone Number: [phone] (Primary), [phone] (Secondary) Address:  [place]  [address] Client [facility] Client Site [facility] Night Physician Briscoe Deutscher- DO Contact Type Call Who Is Calling Patient / Member / Family / Caregiver Call Type Triage / Clinical Relationship To Patient Self Return Phone Number [phone] (Primary) Chief Complaint Coughing Up Blood Reason for Call Symptomatic / Request for Biron states she was given a Z pac prednisone and she finished it. She was better but her symptoms came back. She has a sore throat, deep cough, and now coughing up bleed. She is a cancer PT. She doesnt think she has a fever. Translation No Nurse Assessment Nurse: Sherrell Puller, RN, Amy Date/Time Eilene Ghazi Time): 05/22/2018 9:34:21 AM Confirm and document reason for call. If symptomatic, describe symptoms. ---Caller states she was on Z-pack and Prednisone, was feeling better but now her symptoms are back. Sore throat, deep cough, coughing up blood tinged mucus, doesn't think she has a fever. She's a cancer patient. Has the patient had close contact with a person known or suspected to have the novel coronavirus illness OR traveled / lives in area with major community spread (including international travel) in the last 14 days from the onset of symptoms? * If Asymptomatic, screen for exposure and travel within the last 14 days. ---No Does the patient have any new or worsening symptoms? ---Yes Will a triage be completed? ---Yes Related visit to physician within the last 2 weeks? ---Yes Does the PT have any chronic conditions? (i.e. diabetes, asthma, this includes High risk factors for  pregnancy, etc.) ---No Is this a behavioral health or substance abuse call? ---No Guidelines Guideline Title Affirmed Question Affirmed Notes Nurse Date/Time (Eastern Time) Cough - Acute Productive [1] Continuous (nonstop) coughing interferes with work or school AND [2] no improvement using  Ramsey, RN, Amy 05/22/2018 9:42:26 AM PLEASE NOTE:  All timestamps contained within this report are represented as [place] Standard Time. CONFIDENTIALTY NOTICE: This fax transmission is intended only for the addressee.  It contains information that is legally privileged, confidential or otherwise protected from use or disclosure.  If you are not the intended recipient, you are strictly prohibited from reviewing, disclosing, copying using or disseminating any of this information or taking any action in reliance on or regarding this information.  If you have received this fax in error, please notify us immediately by telephone so that we can arrange for its return to [place]. Phone:  [phone], Toll-Free:  [phone], Fax:  [phone] Page: 2 of 2 Call Id: [id number] Guidelines Guideline Title Affirmed Question Affirmed Notes Nurse Date/Time Eilene Ghazi Time) cough treatment per Care Advice Disp. Time Eilene Ghazi Time) Disposition Final User 05/22/2018 9:52:20 AM See PCP within 24 Hours Yes Sherrell Puller, RN, Amy     Caller Disagree/Comply Comply Caller Understands Yes PreDisposition [place] Advice Given Per Guideline SEE PCP WITHIN 24 HOURS: COUGH MEDICINES: * HOME REMEDY - HONEY: This old home remedy has been shown to help decrease coughing at night. The adult dosage is 2 teaspoons (10 ml) at bedtime. Honey should not be given to infants under one year of age. * OTC COUGH DROPS: Cough drops can help a lot, especially for mild coughs. They reduce coughing by soothing  your irritated throat and removing that tickle sensation in the back of the throat. Cough drops also have the advantage of portability - you can  carry them with you. HUMIDIFIER: * If the air is dry, use a humidifier in the bedroom. * Dry air makes coughs worse. COUGHING SPELLS: * Drink warm fluids. Inhale warm mist. (Reason: both relax the airway and loosen up the phlegm) * Suck on cough drops or hard candy to coat the irritated throat. CALL BACK IF: * Difficulty breathing occurs * You become worse. CARE ADVICE given per Cough - Acute Productive (Adult) guideline. * IF OFFICE WILL BE OPEN: You need to be seen within the next 24 hours. Call your doctor (or NP/PA) when the office opens and make an appointment. Patient says she has used telehealth before and will do that again. Referrals REFERRED TO PCP OFFICE

## 2018-06-21 ENCOUNTER — Other Ambulatory Visit: Payer: Self-pay | Admitting: Family Medicine

## 2018-06-21 DIAGNOSIS — J301 Allergic rhinitis due to pollen: Secondary | ICD-10-CM

## 2018-06-21 DIAGNOSIS — I10 Essential (primary) hypertension: Secondary | ICD-10-CM

## 2018-06-21 DIAGNOSIS — M4722 Other spondylosis with radiculopathy, cervical region: Secondary | ICD-10-CM

## 2018-06-21 DIAGNOSIS — F419 Anxiety disorder, unspecified: Secondary | ICD-10-CM

## 2018-06-22 ENCOUNTER — Other Ambulatory Visit: Payer: Self-pay | Admitting: Family

## 2018-06-22 ENCOUNTER — Inpatient Hospital Stay: Payer: 59 | Attending: Hematology & Oncology

## 2018-06-22 ENCOUNTER — Other Ambulatory Visit: Payer: Self-pay

## 2018-06-22 VITALS — BP 162/83 | HR 81 | Temp 98.0°F | Resp 18

## 2018-06-22 DIAGNOSIS — C7B02 Secondary carcinoid tumors of liver: Secondary | ICD-10-CM | POA: Insufficient documentation

## 2018-06-22 DIAGNOSIS — C7A00 Malignant carcinoid tumor of unspecified site: Secondary | ICD-10-CM | POA: Diagnosis present

## 2018-06-22 DIAGNOSIS — C7B8 Other secondary neuroendocrine tumors: Secondary | ICD-10-CM

## 2018-06-22 MED ORDER — LANREOTIDE ACETATE 120 MG/0.5ML ~~LOC~~ SOLN
120.0000 mg | Freq: Once | SUBCUTANEOUS | Status: AC
  Administered 2018-06-22: 120 mg via SUBCUTANEOUS

## 2018-06-22 MED ORDER — LANREOTIDE ACETATE 120 MG/0.5ML ~~LOC~~ SOLN
SUBCUTANEOUS | Status: AC
  Filled 2018-06-22: qty 120

## 2018-06-22 NOTE — Telephone Encounter (Signed)
Last OV 05/04/18 Last refill Clonazepam 03/11/2018 #30/1                 Qsymia 02/08/18 #30/1 Next OV 08/03/18

## 2018-06-22 NOTE — Telephone Encounter (Signed)
Last OV 05/04/2018 Last refill Aspirin 03/29/18 #90/0                 Montelukast 03/29/18 #90/0                 Naproxen not on current med list Next OV 08/03/18

## 2018-06-22 NOTE — Patient Instructions (Signed)
Lanreotide injection What is this medicine? LANREOTIDE (lan REE oh tide) is used to reduce blood levels of growth hormone in patients with a condition called acromegaly. It also works to slow or stop tumor growth in patients with neuroendocrine tumors and treat carcinoid syndrome. This medicine may be used for other purposes; ask your health care provider or pharmacist if you have questions. COMMON BRAND NAME(S): Somatuline [place] What should I tell my health care provider before I take this medicine? They need to know if you have any of these conditions: -diabetes -gallbladder disease -heart disease -kidney disease -liver disease -thyroid disease -an unusual or allergic reaction to lanreotide, other medicines, foods, dyes, or preservatives -pregnant or trying to get pregnant -breast-feeding How should I use this medicine? This medicine is for injection under the skin. It is given by a health care professional in a hospital or clinic setting. Contact your pediatrician or health care professional regarding the use of this medicine in children. Special care may be needed. Overdosage: If you think you have taken too much of this medicine contact a poison control center or emergency room at once. NOTE: This medicine is only for you. Do not share this medicine with others. What if I miss a dose? It is important not to miss your dose. Call your doctor or health care professional if you are unable to keep an appointment. What may interact with this medicine? This medicine may interact with the following medications: -bromocriptine -cyclosporine -certain medicines for blood pressure, heart disease, irregular heart beat -certain medicines for diabetes -quinidine -terfenadine This list may not describe all possible interactions. Give your health care provider a list of all the medicines, herbs, non-prescription drugs, or dietary supplements you use. Also tell them if you smoke, drink alcohol, or  use illegal drugs. Some items may interact with your medicine. What should I watch for while using this medicine? Tell your doctor or healthcare professional if your symptoms do not start to get better or if they get worse. Visit your doctor or health care professional for regular checks on your progress. Your condition will be monitored carefully while you are receiving this medicine. You may need blood work done while you are taking this medicine. Women should inform their doctor if they wish to become pregnant or think they might be pregnant. There is a potential for serious side effects to an unborn child. Talk to your health care professional or pharmacist for more information. Do not breast-feed an infant while taking this medicine or for 6 months after stopping it. This medicine has caused ovarian failure in some women. This medicine may interfere with the ability to have a child. Talk with your doctor or health care professional if you are concerned about your fertility. What side effects may I notice from receiving this medicine? Side effects that you should report to your doctor or health care professional as soon as possible: -allergic reactions like skin rash, itching or hives, swelling of the face, lips, or tongue -increased blood pressure -severe stomach pain -signs and symptoms of high blood sugar such as dizziness; dry mouth; dry skin; fruity breath; nausea; stomach pain; increased hunger or thirst; increased urination -signs and symptoms of low blood sugar such as feeling anxious; confusion; dizziness; increased hunger; unusually weak or tired; sweating; shakiness; cold; irritable; headache; blurred vision; fast heartbeat; loss of consciousness -unusually slow heartbeat Side effects that usually do not require medical attention (report to your doctor or health care professional if they continue   anxious; confusion; dizziness; increased hunger; unusually weak or tired; sweating; shakiness; cold; irritable; headache; blurred vision; fast heartbeat; loss of consciousness  -unusually slow heartbeat  Side effects that usually do not require medical attention (report to your doctor or health care professional if they continue or are bothersome):  -constipation  -diarrhea  -dizziness  -headache  -muscle pain  -muscle  spasms  -nausea  -pain, redness, or irritation at site where injected  This list may not describe all possible side effects. Call your doctor for medical advice about side effects. You may report side effects to FDA at 1-800-FDA-1088.  Where should I keep my medicine?  This drug is given in a hospital or clinic and will not be stored at home.  NOTE: This sheet is a summary. It may not cover all possible information. If you have questions about this medicine, talk to your doctor, pharmacist, or health care provider.   2019 Elsevier/Gold Standard (2015-11-02 10:33:47)

## 2018-07-02 ENCOUNTER — Other Ambulatory Visit: Payer: Self-pay

## 2018-07-02 DIAGNOSIS — M4722 Other spondylosis with radiculopathy, cervical region: Secondary | ICD-10-CM

## 2018-07-02 MED ORDER — NAPROXEN 500 MG PO TABS: 500.0000 mg | ORAL_TABLET | Freq: Two times a day (BID) | ORAL | 0 refills | Status: DC

## 2018-07-03 ENCOUNTER — Other Ambulatory Visit: Payer: Self-pay | Admitting: Family Medicine

## 2018-07-05 ENCOUNTER — Other Ambulatory Visit: Payer: Self-pay | Admitting: Family Medicine

## 2018-07-23 ENCOUNTER — Inpatient Hospital Stay: Payer: 59 | Attending: Hematology & Oncology

## 2018-07-23 ENCOUNTER — Other Ambulatory Visit: Payer: Self-pay

## 2018-07-23 VITALS — BP 140/66 | HR 80 | Temp 98.0°F | Resp 18

## 2018-07-23 DIAGNOSIS — C7A00 Malignant carcinoid tumor of unspecified site: Secondary | ICD-10-CM | POA: Insufficient documentation

## 2018-07-23 DIAGNOSIS — C7B8 Other secondary neuroendocrine tumors: Secondary | ICD-10-CM

## 2018-07-23 DIAGNOSIS — C7B02 Secondary carcinoid tumors of liver: Secondary | ICD-10-CM | POA: Insufficient documentation

## 2018-07-23 MED ORDER — LANREOTIDE ACETATE 120 MG/0.5ML ~~LOC~~ SOLN
120.0000 mg | Freq: Once | SUBCUTANEOUS | Status: AC
  Administered 2018-07-23: 120 mg via SUBCUTANEOUS

## 2018-07-23 MED ORDER — LANREOTIDE ACETATE 120 MG/0.5ML ~~LOC~~ SOLN
SUBCUTANEOUS | Status: AC
  Filled 2018-07-23: qty 120

## 2018-07-23 NOTE — Patient Instructions (Signed)
Lanreotide injection What is this medicine? LANREOTIDE (lan REE oh tide) is used to reduce blood levels of growth hormone in patients with a condition called acromegaly. It also works to slow or stop tumor growth in patients with neuroendocrine tumors and treat carcinoid syndrome. This medicine may be used for other purposes; ask your health care provider or pharmacist if you have questions. COMMON BRAND NAME(S): Somatuline [place] What should I tell my health care provider before I take this medicine? They need to know if you have any of these conditions: -diabetes -gallbladder disease -heart disease -kidney disease -liver disease -thyroid disease -an unusual or allergic reaction to lanreotide, other medicines, foods, dyes, or preservatives -pregnant or trying to get pregnant -breast-feeding How should I use this medicine? This medicine is for injection under the skin. It is given by a health care professional in a hospital or clinic setting. Contact your pediatrician or health care professional regarding the use of this medicine in children. Special care may be needed. Overdosage: If you think you have taken too much of this medicine contact a poison control center or emergency room at once. NOTE: This medicine is only for you. Do not share this medicine with others. What if I miss a dose? It is important not to miss your dose. Call your doctor or health care professional if you are unable to keep an appointment. What may interact with this medicine? This medicine may interact with the following medications: -bromocriptine -cyclosporine -certain medicines for blood pressure, heart disease, irregular heart beat -certain medicines for diabetes -quinidine -terfenadine This list may not describe all possible interactions. Give your health care provider a list of all the medicines, herbs, non-prescription drugs, or dietary supplements you use. Also tell them if you smoke, drink alcohol, or  use illegal drugs. Some items may interact with your medicine. What should I watch for while using this medicine? Tell your doctor or healthcare professional if your symptoms do not start to get better or if they get worse. Visit your doctor or health care professional for regular checks on your progress. Your condition will be monitored carefully while you are receiving this medicine. You may need blood work done while you are taking this medicine. Women should inform their doctor if they wish to become pregnant or think they might be pregnant. There is a potential for serious side effects to an unborn child. Talk to your health care professional or pharmacist for more information. Do not breast-feed an infant while taking this medicine or for 6 months after stopping it. This medicine has caused ovarian failure in some women. This medicine may interfere with the ability to have a child. Talk with your doctor or health care professional if you are concerned about your fertility. What side effects may I notice from receiving this medicine? Side effects that you should report to your doctor or health care professional as soon as possible: -allergic reactions like skin rash, itching or hives, swelling of the face, lips, or tongue -increased blood pressure -severe stomach pain -signs and symptoms of high blood sugar such as dizziness; dry mouth; dry skin; fruity breath; nausea; stomach pain; increased hunger or thirst; increased urination -signs and symptoms of low blood sugar such as feeling anxious; confusion; dizziness; increased hunger; unusually weak or tired; sweating; shakiness; cold; irritable; headache; blurred vision; fast heartbeat; loss of consciousness -unusually slow heartbeat Side effects that usually do not require medical attention (report to your doctor or health care professional if they continue   anxious; confusion; dizziness; increased hunger; unusually weak or tired; sweating; shakiness; cold; irritable; headache; blurred vision; fast heartbeat; loss of consciousness  -unusually slow heartbeat  Side effects that usually do not require medical attention (report to your doctor or health care professional if they continue or are bothersome):  -constipation  -diarrhea  -dizziness  -headache  -muscle pain  -muscle  spasms  -nausea  -pain, redness, or irritation at site where injected  This list may not describe all possible side effects. Call your doctor for medical advice about side effects. You may report side effects to FDA at 1-800-FDA-1088.  Where should I keep my medicine?  This drug is given in a hospital or clinic and will not be stored at home.  NOTE: This sheet is a summary. It may not cover all possible information. If you have questions about this medicine, talk to your doctor, pharmacist, or health care provider.   2019 Elsevier/Gold Standard (2015-11-02 10:33:47)

## 2018-08-03 ENCOUNTER — Ambulatory Visit: Payer: 59 | Admitting: Family Medicine

## 2018-08-11 ENCOUNTER — Ambulatory Visit: Payer: 59 | Admitting: Family Medicine

## 2018-08-11 DIAGNOSIS — Z0289 Encounter for other administrative examinations: Secondary | ICD-10-CM

## 2018-08-11 NOTE — Progress Notes (Deleted)
Subjective:    Leah [name] is a 58 y.o. female and is here for a comprehensive physical exam.  Health Maintenance Due  Topic Date Due  . COLONOSCOPY  02/10/2018  . FOOT EXAM  03/06/2018  . OPHTHALMOLOGY EXAM  05/09/2018  . HEMOGLOBIN A1C  06/01/2018     Current Outpatient Medications:  .  Acyclovir-Hydrocortisone (XERESE) 5-1 % CREA, APPLY TO EFFECTED AREA, Disp: 5 g, Rfl: 0 .  amLODipine (NORVASC) 10 MG tablet, Take 1 tablet (10 mg total) by mouth daily., Disp: 90 tablet, Rfl: 0 .  aspirin 81 MG EC tablet, TAKE 1 TABLET BY MOUTH EVERY DAY, Disp: 90 tablet, Rfl: 1 .  chlorpheniramine-HYDROcodone (TUSSIONEX PENNKINETIC ER) 10-8 MG/5ML SUER, Take 5 mLs by mouth at bedtime as needed for cough. (Patient not taking: Reported on 05/18/2018), Disp: 60 mL, Rfl: 0 .  clonazePAM (KLONOPIN) 0.5 MG tablet, TAKE 1 TABLET BY MOUTH TWICE A DAY AS NEEDED FOR ANXIETY, Disp: 30 tablet, Rfl: 1 .  fexofenadine (ALLEGRA) 180 MG tablet, Take 1 tablet (180 mg total) by mouth daily., Disp: 90 tablet, Rfl: 3 .  fluticasone (FLONASE) 50 MCG/ACT nasal spray, USE 2 SPRAYS IN BOTH NOSTRILS EVERY DAY (Patient taking differently: Place 2 sprays into both nostrils daily. ), Disp: 16 g, Rfl: 2 .  gabapentin (NEURONTIN) 100 MG capsule, TAKE 1 CAPSULE (100 MG TOTAL) BY MOUTH AT BEDTIME., Disp: 90 capsule, Rfl: 0 .  ipratropium (ATROVENT) 0.06 % nasal spray, PLACE 2 SPRAYS INTO BOTH NOSTRILS 4 (FOUR) TIMES DAILY., Disp: 15 mL, Rfl: 0 .  metoprolol succinate (TOPROL-XL) 100 MG 24 hr tablet, TAKE 1 TABLET BY MOUTH EVERY EVENING WITH OR IMMEDIATELY FOLLOWING A MEAL, Disp: 90 tablet, Rfl: 1 .  montelukast (SINGULAIR) 10 MG tablet, TAKE 1 TABLET BY MOUTH EVERYDAY AT BEDTIME, Disp: 90 tablet, Rfl: 1 .  naproxen (NAPROSYN) 500 MG tablet, Take 1 tablet (500 mg total) by mouth 2 (two) times daily with a meal., Disp: 180 tablet, Rfl: 0 .  octreotide (SANDOSTATIN LAR) 30 MG injection, Inject 30 mg into the muscle every 28  (twenty-eight) days., Disp: , Rfl:  .  ondansetron (ZOFRAN-ODT) 4 MG disintegrating tablet, DISSOLVE 1 TABLET BY MOUTH EVERY 8 HOURS AS NEEDED FOR NAUSEA AND VOMITING, Disp: 90 tablet, Rfl: 0 .  Ospemifene (OSPHENA) 60 MG TABS, Take 1 tablet by mouth daily., Disp: 90 tablet, Rfl: 3 .  pantoprazole (PROTONIX) 40 MG tablet, Take 1 tablet (40 mg total) by mouth 2 (two) times daily., Disp: 180 tablet, Rfl: 0 .  PROAIR HFA 108 (90 Base) MCG/ACT inhaler, INHALE 2 PUFFS INTO THE LUNGS EVERY 6 (SIX) HOURS AS NEEDED. FOR SHORTNESS OF BREATH. (Patient taking differently: Inhale 2 puffs into the lungs every 6 (six) hours as needed for wheezing or shortness of breath. ), Disp: 6.7 Inhaler, Rfl: 4 .  QSYMIA 7.5-46 MG CP24, TAKE 1 CAPSULE BY MOUTH EVERY DAY, Disp: 30 capsule, Rfl: 1 .  valACYclovir (VALTREX) 500 MG tablet, TAKE 1 TABLET BY MOUTH TWICE A DAY AS NEEDED FOR COLD SORES, Disp: 180 tablet, Rfl: 1 .  valsartan (DIOVAN) 320 MG tablet, TAKE 1 TABLET (320 MG TOTAL) BY MOUTH DAILY., Disp: 90 tablet, Rfl: 1 .  venlafaxine XR (EFFEXOR-XR) 150 MG 24 hr capsule, TAKE 1 CAPSULE (150 MG TOTAL) BY MOUTH EACH MORNING, Disp: 90 capsule, Rfl: 1  PMHx, SurgHx, SocialHx, Medications, and Allergies were reviewed in the Visit Navigator and updated as appropriate.   Past Medical History:  Diagnosis Date  . Allergic rhinitis   . Atrophic vaginitis   . Borderline diabetes 03/17/2013  . 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 GE reflux   . Hepatic encephalopathy ([place]) 2011  . History of PCR DNA positive for HSV1   . History of retinal vein occlusion, right eye   . HTN (hypertension) 2011  . Migraines   . Neuroendocrine tumor 2011  . Orthostatic hypotension   . Palpitations   . Sarcoidosis 2008  . Surgical menopause     Past Surgical History:  Procedure Laterality Date  . ABDOMINAL HYSTERECTOMY  2004  . AUGMENTATION MAMMAPLASTY    . BREAST BIOPSY Left   . CESAREAN SECTION  [id number]  . CHOLECYSTECTOMY  2006  . KNEE  ARTHROSCOPY Left 09/2010  . LAPAROSCOPIC HEPATECTOMY    . MASTECTOMY Bilateral 2008  . OOPHORECTOMY  2004  . RECONSTRUCTION BREAST W/ LATISSIMUS DORSI FLAP Bilateral   . TONSILLECTOMY  1976  . TUBAL LIGATION Bilateral 2001    Family History  Problem Relation Age of Onset  . Arthritis Maternal Grandmother   . Arthritis Maternal Grandfather   . Arthritis Paternal Grandmother   . Arthritis Paternal Grandfather   . Heart failure Paternal Grandfather   . Breast cancer Mother 18  . Lupus Mother   . Heart failure Father   . Hypertension Father   . Hyperlipidemia Father   . Breast cancer Maternal Aunt 38  . Ovarian cancer Maternal Aunt 60  . Breast cancer Maternal Aunt 56    Social History   Tobacco Use  . Smoking status: Never Smoker  . Smokeless tobacco: Never Used  . Tobacco comment: never used tobacco  Substance Use Topics  . Alcohol use: No    Alcohol/week: 0.0 standard drinks  . Drug use: No    Review of Systems:   Pertinent items are noted in the HPI. Otherwise, ROS is negative.  Objective:   There were no vitals taken for this visit.  General appearance: alert, cooperative and appears stated age. Head: normocephalic, without obvious abnormality, atraumatic. Neck: no adenopathy, supple, symmetrical, trachea midline; thyroid not enlarged, symmetric, no tenderness/mass/nodules. Lungs: clear to auscultation bilaterally. Breasts: inspection negative, no nipple retraction or dimpling, no nipple discharge or bleeding, no axillary or supraclavicular adenopathy, normal to palpation without dominant masses. Heart: regular rate and rhythm Abdomen: soft, non-tender; no masses,  no organomegaly. Extremities: extremities normal, atraumatic, no cyanosis or edema. Skin: skin color, texture, turgor normal, no rashes or lesions. Lymph: cervical, supraclavicular, and axillary nodes normal; no abnormal inguinal nodes palpated. Neurologic: grossly normal.  Pelvic:  External  genitalia: no lesions. Urethra: normal appearing urethra with no masses, tenderness or lesions. Bartholin's and Skene's: normal. Vagina: normal appearing vagina with normal color and discharge, no lesions. Cervix: normal appearance. Pap and high risk HPV testing done: {yes no:[id number]} Uterus: uterus is normal size, shape, consistency and nontender. Adnexa: normal adnexa in size, nontender and no masses.                                      Assessment/Plan:   There are no diagnoses linked to this encounter.  Patient Counseling: [x]    Nutrition: Stressed importance of moderation in sodium/caffeine intake, saturated fat and cholesterol, caloric balance, sufficient intake of fresh fruits, vegetables, fiber, calcium, iron, and 1 mg of folate supplement per day (for females capable of pregnancy).  [x]    Stressed  the importance of regular exercise.   [x]    Substance Abuse: Discussed cessation/primary prevention of tobacco, alcohol, or other drug use; driving or other dangerous activities under the influence; availability of treatment for abuse.   [x]    Injury prevention: Discussed safety belts, safety helmets, smoke detector, smoking near bedding or upholstery.   [x]    Sexuality: Discussed sexually transmitted diseases, partner selection, use of condoms, avoidance of unintended pregnancy  and contraceptive alternatives.  [x]    Dental health: Discussed importance of regular tooth brushing, flossing, and dental visits.  [x]    Health maintenance and immunizations reviewed. Please refer to Health maintenance section.   Briscoe Deutscher, DO [place]

## 2018-08-12 ENCOUNTER — Encounter: Payer: Self-pay | Admitting: Family Medicine

## 2018-08-12 ENCOUNTER — Telehealth: Payer: Self-pay | Admitting: Family Medicine

## 2018-08-12 NOTE — Telephone Encounter (Signed)
General/Other - Appointment  The patient called to reschedule her appointment that she switched with her son who was in a scooter accident. Please call back

## 2018-08-23 ENCOUNTER — Inpatient Hospital Stay: Payer: 59

## 2018-08-23 ENCOUNTER — Encounter: Payer: Self-pay | Admitting: Family

## 2018-08-23 ENCOUNTER — Other Ambulatory Visit: Payer: Self-pay

## 2018-08-23 ENCOUNTER — Inpatient Hospital Stay (HOSPITAL_BASED_OUTPATIENT_CLINIC_OR_DEPARTMENT_OTHER): Payer: 59 | Admitting: Family

## 2018-08-23 ENCOUNTER — Inpatient Hospital Stay: Payer: 59 | Attending: Hematology & Oncology

## 2018-08-23 VITALS — BP 138/84 | HR 79 | Temp 98.3°F | Resp 18 | Ht 59.0 in | Wt 229.0 lb

## 2018-08-23 DIAGNOSIS — C7B02 Secondary carcinoid tumors of liver: Secondary | ICD-10-CM | POA: Diagnosis not present

## 2018-08-23 DIAGNOSIS — C7B8 Other secondary neuroendocrine tumors: Secondary | ICD-10-CM

## 2018-08-23 DIAGNOSIS — C7A00 Malignant carcinoid tumor of unspecified site: Secondary | ICD-10-CM

## 2018-08-23 LAB — CMP (CANCER CENTER ONLY)
ALT: 24 U/L (ref 0–44)
AST: 19 U/L (ref 15–41)
Albumin: 3.7 g/dL (ref 3.5–5.0)
Alkaline Phosphatase: 97 U/L (ref 38–126)
Anion gap: 9 (ref 5–15)
BUN: 19 mg/dL (ref 6–20)
CO2: 25 mmol/L (ref 22–32)
Calcium: 8.1 mg/dL — ABNORMAL LOW (ref 8.9–10.3)
Chloride: 104 mmol/L (ref 98–111)
Creatinine: 0.67 mg/dL (ref 0.44–1.00)
GFR, Est AFR Am: >60 mL/min (ref >60)
GFR, Estimated: >60 mL/min (ref >60)
Glucose, Bld: 206 mg/dL — ABNORMAL HIGH (ref 70–99)
Potassium: 4.3 mmol/L (ref 3.5–5.1)
Sodium: 138 mmol/L (ref 135–145)
Total Bilirubin: 0.4 mg/dL (ref 0.3–1.2)
Total Protein: 6.6 g/dL (ref 6.5–8.1)

## 2018-08-23 LAB — CBC WITH DIFFERENTIAL (CANCER CENTER ONLY)
Abs Immature Granulocytes: 0.03 10*3/uL (ref 0.00–0.07)
Basophils Absolute: 0.1 10*3/uL (ref 0.0–0.1)
Basophils Relative: 1 %
Eosinophils Absolute: 0.2 10*3/uL (ref 0.0–0.5)
Eosinophils Relative: 2 %
HCT: 39.9 % (ref 36.0–46.0)
Hemoglobin: 12.8 g/dL (ref 12.0–15.0)
Immature Granulocytes: 0 %
Lymphocytes Relative: 30 %
Lymphs Abs: 3.2 10*3/uL (ref 0.7–4.0)
MCH: 29.5 pg (ref 26.0–34.0)
MCHC: 32.1 g/dL (ref 30.0–36.0)
MCV: 91.9 fL (ref 80.0–100.0)
Monocytes Absolute: 0.5 10*3/uL (ref 0.1–1.0)
Monocytes Relative: 5 %
Neutro Abs: 6.5 10*3/uL (ref 1.7–7.7)
Neutrophils Relative %: 62 %
Platelet Count: 245 10*3/uL (ref 150–400)
RBC: 4.34 MIL/uL (ref 3.87–5.11)
RDW: 13.4 % (ref 11.5–15.5)
WBC Count: 10.5 10*3/uL (ref 4.0–10.5)
nRBC: 0 % (ref 0.0–0.2)

## 2018-08-23 MED ORDER — LANREOTIDE ACETATE 120 MG/0.5ML ~~LOC~~ SOLN
120.0000 mg | Freq: Once | SUBCUTANEOUS | Status: AC
  Administered 2018-08-23: 120 mg via SUBCUTANEOUS

## 2018-08-23 MED ORDER — LANREOTIDE ACETATE 120 MG/0.5ML ~~LOC~~ SOLN
SUBCUTANEOUS | Status: AC
  Filled 2018-08-23: qty 120

## 2018-08-23 NOTE — Progress Notes (Signed)
Hematology and Oncology Follow Up Visit  Leah [name] [id number] 1960/09/10 58 y.o. 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 She is doing well but has had some abdominal cramping off and on this last week.  Her grandson is finally home and she is enjoying time with her family.  She states that she follows up with Dr. Crisoforo Oxford in September. They continue to monitor the 2 small tumors on her liver with scans every 6 months.  Chromogranin A level was stable at 83 in April. This was redrawn today and result is pending.  No fever, chills, n/v, cough, rash, dizziness, SOB, chest pain, palpitations or changes in bowel or bladder habits.  No swelling, tenderness, numbness or tingling in her extremities.  No episodes of bleeding, no bruising or petechiae.  She got hot and passed out in [place] recently. She tore the meniscus in her right knee and will need surgery. She is still able to ambulate without assistance.  No other falls or syncopal episodes to report. She is eating well and staying well hydrated. Her weight is stable.   ECOG Performance Status: 1 - Symptomatic but completely ambulatory  Medications:  Allergies as of 08/23/2018   No Known Allergies     Medication List       Accurate as of August 23, 2018 12:53 PM. If you have any questions, ask your nurse or doctor.        Acyclovir-Hydrocortisone 5-1 % Crea Commonly known as: Xerese APPLY TO EFFECTED AREA   amLODipine 10 MG tablet Commonly known as: NORVASC Take 1 tablet (10 mg total) by mouth daily.   aspirin 81 MG EC tablet TAKE 1 TABLET BY MOUTH EVERY DAY   chlorpheniramine-HYDROcodone 10-8 MG/5ML Suer Commonly known as: Tussionex Pennkinetic ER Take 5 mLs by mouth at bedtime as needed for  cough.   clonazePAM 0.5 MG tablet Commonly known as: KLONOPIN TAKE 1 TABLET BY MOUTH TWICE A DAY AS NEEDED FOR ANXIETY   fexofenadine 180 MG tablet Commonly known as: ALLEGRA Take 1 tablet (180 mg total) by mouth daily.   fluticasone 50 MCG/ACT nasal spray Commonly known as: FLONASE USE 2 SPRAYS IN BOTH NOSTRILS EVERY DAY What changed: See the new instructions.   gabapentin 100 MG capsule Commonly known as: NEURONTIN TAKE 1 CAPSULE (100 MG TOTAL) BY MOUTH AT BEDTIME.   ipratropium 0.06 % nasal spray Commonly known as: ATROVENT PLACE 2 SPRAYS INTO BOTH NOSTRILS 4 (FOUR) TIMES DAILY.   metoprolol succinate 100 MG 24 hr tablet Commonly known as: TOPROL-XL TAKE 1 TABLET BY MOUTH EVERY EVENING WITH OR IMMEDIATELY FOLLOWING A MEAL   montelukast 10 MG tablet Commonly known as: SINGULAIR TAKE 1 TABLET BY MOUTH EVERYDAY AT BEDTIME   naproxen 500 MG tablet Commonly known as: NAPROSYN Take 1 tablet (500 mg total) by mouth 2 (two) times daily with a meal.   octreotide 30 MG injection Commonly known as: SANDOSTATIN LAR Inject 30 mg into the muscle every 28 (twenty-eight) days.   ondansetron 4 MG disintegrating tablet Commonly known as: ZOFRAN-ODT DISSOLVE 1 TABLET BY MOUTH EVERY 8 HOURS AS NEEDED FOR NAUSEA AND VOMITING   Ospemifene 60 MG Tabs Commonly known as: Osphena Take 1 tablet by mouth daily.   pantoprazole 40 MG tablet Commonly known as: PROTONIX Take 1  tablet (40 mg total) by mouth 2 (two) times daily.   ProAir HFA 108 (90 Base) MCG/ACT inhaler Generic drug: albuterol INHALE 2 PUFFS INTO THE LUNGS EVERY 6 (SIX) HOURS AS NEEDED. FOR SHORTNESS OF BREATH. What changed: See the new instructions.   Qsymia 7.5-46 MG Cp24 Generic drug: Phentermine-Topiramate TAKE 1 CAPSULE BY MOUTH EVERY DAY   valACYclovir 500 MG tablet Commonly known as: VALTREX TAKE 1 TABLET BY MOUTH TWICE A DAY AS NEEDED FOR COLD SORES   valsartan 320 MG tablet Commonly known as: DIOVAN TAKE  1 TABLET (320 MG TOTAL) BY MOUTH DAILY.   venlafaxine XR 150 MG 24 hr capsule Commonly known as: EFFEXOR-XR TAKE 1 CAPSULE (150 MG TOTAL) BY MOUTH EACH MORNING       Allergies: No Known Allergies  Past Medical History, Surgical history, Social history, and Family History were reviewed and updated.  Review of Systems: All other 10 point review of systems is negative.   Physical Exam:  height is 4\' 11"  (1.499 m) and weight is 229 lb 0.6 oz (103.9 kg). Her oral temperature is 98.3 F (36.8 C). Her blood pressure is 138/84 and her pulse is 79. Her respiration is 18 and oxygen saturation is 100%.   Wt Readings from Last 3 Encounters:  08/23/18 229 lb 0.6 oz (103.9 kg)  05/18/18 222 lb 12.8 oz (101.1 kg)  04/19/18 236 lb (107 kg)    Ocular: Sclerae unicteric, pupils equal, round and reactive to light Ear-nose-throat: Oropharynx clear, dentition fair Lymphatic: No cervical or supraclavicular adenopathy Lungs no rales or rhonchi, good excursion bilaterally Heart regular rate and rhythm, no murmur appreciated Abd soft, nontender, positive bowel sounds, no liver or spleen tip palpated on exam, no fluid wave  MSK no focal spinal tenderness, no joint edema Neuro: non-focal, well-oriented, appropriate affect Breasts: Deferred   Lab Results  Component Value Date   WBC 10.5 08/23/2018   HGB 12.8 08/23/2018   HCT 39.9 08/23/2018   MCV 91.9 08/23/2018   PLT 245 08/23/2018   Lab Results  Component Value Date   FERRITIN 382 (H) 10/18/2008   IRON <10 (L) 10/18/2008   TIBC NOT CALC Not calculated due to Iron <10. 10/18/2008   UIBC 156 10/18/2008   IRONPCTSAT NOT CALC Not calculated due to Iron <10. 10/18/2008   Lab Results  Component Value Date   RETICCTPCT 0.9 10/18/2008   RBC 4.34 08/23/2018   No results found for: KPAFRELGTCHN, LAMBDASER, KAPLAMBRATIO No results found for: IGGSERUM, IGA, IGMSERUM No results found for: Odetta Pink, SPEI   Chemistry      Component Value Date/Time   NA 138 08/23/2018 1041   NA 140 01/20/2017 1416   NA 141 01/17/2016 1047   K 4.3 08/23/2018 1041   K 3.9 01/20/2017 1416   K 3.2 (L) 01/17/2016 1047   CL 104 08/23/2018 1041   CL 102 01/20/2017 1416   CO2 25 08/23/2018 1041   CO2 26 01/20/2017 1416   CO2 27 01/17/2016 1047   BUN 19 08/23/2018 1041   BUN 15 01/20/2017 1416   BUN 6.6 (L) 01/17/2016 1047   CREATININE 0.67 08/23/2018 1041   CREATININE 1.1 01/20/2017 1416   CREATININE 0.7 01/17/2016 1047      Component Value Date/Time   CALCIUM 8.1 (L) 08/23/2018 1041   CALCIUM 9.1 01/20/2017 1416   CALCIUM 8.7 01/17/2016 1047   ALKPHOS 97 08/23/2018 1041   ALKPHOS 84 01/20/2017 1416  ALKPHOS 90 01/17/2016 1047   AST 19 08/23/2018 1041   AST 34 01/17/2016 1047   ALT 24 08/23/2018 1041   ALT 35 01/20/2017 1416   ALT 38 01/17/2016 1047   BILITOT 0.4 08/23/2018 1041   BILITOT 0.42 01/17/2016 1047       Impression and Plan: Leah [name] is a very pleasant 58 yo caucasian female withlong standing history ofmetastaticcarcinoidwith recurrent liver metastasis. We will proceed with Somatuline injection today as planned. She will stay on her monthly schedule.  She follows up with Dr. Crisoforo Oxford in September.  We will plan to see her back in another 3 months for follow-up.  She will contact our office with any questions or concerns. We can certainly see her sooner if needed.   Laverna Peace, NP 7/13/202012:53 PM

## 2018-08-23 NOTE — Progress Notes (Signed)
Judson Roch, NP to discuss low Ca during office visit. dph

## 2018-08-23 NOTE — Patient Instructions (Signed)
Lanreotide injection What is this medicine? LANREOTIDE (lan REE oh tide) is used to reduce blood levels of growth hormone in patients with a condition called acromegaly. It also works to slow or stop tumor growth in patients with neuroendocrine tumors and treat carcinoid syndrome. This medicine may be used for other purposes; ask your health care provider or pharmacist if you have questions. COMMON BRAND NAME(S): Somatuline [place] What should I tell my health care provider before I take this medicine? They need to know if you have any of these conditions:  diabetes  gallbladder disease  heart disease  kidney disease  liver disease  thyroid disease  an unusual or allergic reaction to lanreotide, other medicines, foods, dyes, or preservatives  pregnant or trying to get pregnant  breast-feeding How should I use this medicine? This medicine is for injection under the skin. It is given by a health care professional in a hospital or clinic setting. Contact your pediatrician or health care professional regarding the use of this medicine in children. Special care may be needed. Overdosage: If you think you have taken too much of this medicine contact a poison control center or emergency room at once. NOTE: This medicine is only for you. Do not share this medicine with others. What if I miss a dose? It is important not to miss your dose. Call your doctor or health care professional if you are unable to keep an appointment. What may interact with this medicine? This medicine may interact with the following medications:  bromocriptine  cyclosporine  certain medicines for blood pressure, heart disease, irregular heart beat  certain medicines for diabetes  quinidine  terfenadine This list may not describe all possible interactions. Give your health care provider a list of all the medicines, herbs, non-prescription drugs, or dietary supplements you use. Also tell them if you smoke,  drink alcohol, or use illegal drugs. Some items may interact with your medicine. What should I watch for while using this medicine? Tell your doctor or healthcare professional if your symptoms do not start to get better or if they get worse. Visit your doctor or health care professional for regular checks on your progress. Your condition will be monitored carefully while you are receiving this medicine. This medicine may increase blood sugar. Ask your healthcare provider if changes in diet or medicines are needed if you have diabetes. You may need blood work done while you are taking this medicine. Women should inform their doctor if they wish to become pregnant or think they might be pregnant. There is a potential for serious side effects to an unborn child. Talk to your health care professional or pharmacist for more information. Do not breast-feed an infant while taking this medicine or for 6 months after stopping it. This medicine has caused ovarian failure in some women. This medicine may interfere with the ability to have a child. Talk with your doctor or health care professional if you are concerned about your fertility. What side effects may I notice from receiving this medicine? Side effects that you should report to your doctor or health care professional as soon as possible:  allergic reactions like skin rash, itching or hives, swelling of the face, lips, or tongue  increased blood pressure  severe stomach pain  signs and symptoms of hgh blood sugar such as being more thirsty or hungry or having to urinate more than normal. You may also feel very tired or have blurry vision.  signs and symptoms of low blood   sugar such as feeling anxious; confusion; dizziness; increased hunger; unusually weak or tired; sweating; shakiness; cold; irritable; headache; blurred vision; fast heartbeat; loss of consciousness  unusually slow heartbeat Side effects that usually do not require medical  attention (report to your doctor or health care professional if they continue or are bothersome):  constipation  diarrhea  dizziness  headache  muscle pain  muscle spasms  nausea  pain, redness, or irritation at site where injected This list may not describe all possible side effects. Call your doctor for medical advice about side effects. You may report side effects to FDA at 1-800-FDA-1088. Where should I keep my medicine? This drug is given in a hospital or clinic and will not be stored at home. NOTE: This sheet is a summary. It may not cover all possible information. If you have questions about this medicine, talk to your doctor, pharmacist, or health care provider.  2020 [employer]vier/Gold Standard (2017-11-05 09:13:08)  

## 2018-08-25 LAB — CHROMOGRANIN A: Chromogranin A (ng/mL): 100.8 ng/mL (ref 0.0–101.8)

## 2018-08-30 NOTE — Progress Notes (Signed)
Subjective:    Leah [name] is a 58 y.o. female and is here for a comprehensive physical exam.  Health Maintenance Due  Topic Date Due  . COLONOSCOPY  02/10/2018  . FOOT EXAM  03/06/2018  . OPHTHALMOLOGY EXAM  05/09/2018   Current Outpatient Medications:  .  Acyclovir-Hydrocortisone (XERESE) 5-1 % CREA, APPLY TO EFFECTED AREA, Disp: 5 g, Rfl: 0 .  amLODipine (NORVASC) 10 MG tablet, Take 1 tablet (10 mg total) by mouth daily., Disp: 90 tablet, Rfl: 0 .  aspirin 81 MG EC tablet, TAKE 1 TABLET BY MOUTH EVERY DAY, Disp: 90 tablet, Rfl: 1 .  clonazePAM (KLONOPIN) 0.5 MG tablet, TAKE 1 TABLET BY MOUTH TWICE A DAY AS NEEDED FOR ANXIETY, Disp: 30 tablet, Rfl: 1 .  fexofenadine (ALLEGRA) 180 MG tablet, Take 1 tablet (180 mg total) by mouth daily., Disp: 90 tablet, Rfl: 3 .  fluticasone (FLONASE) 50 MCG/ACT nasal spray, USE 2 SPRAYS IN BOTH NOSTRILS EVERY DAY (Patient taking differently: Place 2 sprays into both nostrils daily. ), Disp: 16 g, Rfl: 2 .  gabapentin (NEURONTIN) 100 MG capsule, TAKE 1 CAPSULE (100 MG TOTAL) BY MOUTH AT BEDTIME., Disp: 90 capsule, Rfl: 0 .  ipratropium (ATROVENT) 0.06 % nasal spray, PLACE 2 SPRAYS INTO BOTH NOSTRILS 4 (FOUR) TIMES DAILY., Disp: 15 mL, Rfl: 0 .  metoprolol succinate (TOPROL-XL) 100 MG 24 hr tablet, TAKE 1 TABLET BY MOUTH EVERY EVENING WITH OR IMMEDIATELY FOLLOWING A MEAL, Disp: 90 tablet, Rfl: 1 .  montelukast (SINGULAIR) 10 MG tablet, TAKE 1 TABLET BY MOUTH EVERYDAY AT BEDTIME, Disp: 90 tablet, Rfl: 1 .  naproxen (NAPROSYN) 500 MG tablet, Take 1 tablet (500 mg total) by mouth 2 (two) times daily with a meal., Disp: 180 tablet, Rfl: 0 .  octreotide (SANDOSTATIN LAR) 30 MG injection, Inject 30 mg into the muscle every 28 (twenty-eight) days., Disp: , Rfl:  .  ondansetron (ZOFRAN-ODT) 4 MG disintegrating tablet, DISSOLVE 1 TABLET BY MOUTH EVERY 8 HOURS AS NEEDED FOR NAUSEA AND VOMITING, Disp: 90 tablet, Rfl: 0 .  Ospemifene (OSPHENA) 60 MG TABS, Take 1  tablet by mouth daily., Disp: 90 tablet, Rfl: 3 .  pantoprazole (PROTONIX) 40 MG tablet, Take 1 tablet (40 mg total) by mouth 2 (two) times daily., Disp: 180 tablet, Rfl: 0 .  PROAIR HFA 108 (90 Base) MCG/ACT inhaler, INHALE 2 PUFFS INTO THE LUNGS EVERY 6 (SIX) HOURS AS NEEDED. FOR SHORTNESS OF BREATH. (Patient taking differently: Inhale 2 puffs into the lungs every 6 (six) hours as needed for wheezing or shortness of breath. ), Disp: 6.7 Inhaler, Rfl: 4 .  QSYMIA 7.5-46 MG CP24, TAKE 1 CAPSULE BY MOUTH EVERY DAY, Disp: 30 capsule, Rfl: 1 .  valACYclovir (VALTREX) 500 MG tablet, TAKE 1 TABLET BY MOUTH TWICE A DAY AS NEEDED FOR COLD SORES, Disp: 180 tablet, Rfl: 1 .  valsartan (DIOVAN) 320 MG tablet, TAKE 1 TABLET (320 MG TOTAL) BY MOUTH DAILY., Disp: 90 tablet, Rfl: 1 .  venlafaxine XR (EFFEXOR-XR) 150 MG 24 hr capsule, TAKE 1 CAPSULE (150 MG TOTAL) BY MOUTH EACH MORNING, Disp: 90 capsule, Rfl: 1  PMHx, SurgHx, SocialHx, Medications, and Allergies were reviewed in the Visit Navigator and updated as appropriate.   Past Medical History:  Diagnosis Date  . Allergic rhinitis   . Atrophic vaginitis   . Borderline diabetes 03/17/2013  . 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 GE reflux   . Hepatic encephalopathy ([place]) 2011  . History of PCR  DNA positive for HSV1   . History of retinal vein occlusion, right eye   . HTN (hypertension) 2011  . Migraines   . Neuroendocrine tumor 2011  . Orthostatic hypotension   . Palpitations   . Sarcoidosis 2008  . Surgical menopause      Past Surgical History:  Procedure Laterality Date  . ABDOMINAL HYSTERECTOMY  2004  . AUGMENTATION MAMMAPLASTY    . BREAST BIOPSY Left   . CESAREAN SECTION  [id number]  . CHOLECYSTECTOMY  2006  . KNEE ARTHROSCOPY Left 09/2010  . LAPAROSCOPIC HEPATECTOMY    . MASTECTOMY Bilateral 2008  . OOPHORECTOMY  2004  . RECONSTRUCTION BREAST W/ LATISSIMUS DORSI FLAP Bilateral   . TONSILLECTOMY  1976  . TUBAL LIGATION Bilateral 2001      Family History  Problem Relation Age of Onset  . Arthritis Maternal Grandmother   . Arthritis Maternal Grandfather   . Arthritis Paternal Grandmother   . Arthritis Paternal Grandfather   . Heart failure Paternal Grandfather   . Breast cancer Mother 74  . Lupus Mother   . Heart failure Father   . Hypertension Father   . Hyperlipidemia Father   . Breast cancer Maternal Aunt 38  . Ovarian cancer Maternal Aunt 60  . Breast cancer Maternal Aunt 69    Social History   Tobacco Use  . Smoking status: Never Smoker  . Smokeless tobacco: Never Used  . Tobacco comment: never used tobacco  Substance Use Topics  . Alcohol use: No    Alcohol/week: 0.0 standard drinks  . Drug use: No   Review of Systems:   Pertinent items are noted in the HPI. Otherwise, ROS is negative.  Objective:   Pulse (!) 114   Temp 98.1 F (36.7 C) (Oral)   Ht 4\' 11"  (1.499 m)   Wt 229 lb (103.9 kg)   SpO2 97%   BMI 46.25 kg/m   General appearance: alert, cooperative and appears stated age. Head: normocephalic, without obvious abnormality, atraumatic. Neck: no adenopathy, supple, symmetrical, trachea midline; thyroid not enlarged, symmetric, no tenderness/mass/nodules. Lungs: clear to auscultation bilaterally. Heart: regular rate and rhythm Abdomen: soft, non-tender; no masses,  no organomegaly. Extremities: extremities normal, atraumatic, no cyanosis or edema. Skin: skin color, texture, turgor normal, no rashes or lesions. Lymph: cervical, supraclavicular, and axillary nodes normal; no abnormal inguinal nodes palpated. Neurologic: grossly normal.                                      Assessment/Plan:   1. Routine physical examination   2. Hypertension associated with diabetes ([place])   3. Type 2 diabetes mellitus with other specified complication, without long-term current use of insulin ([place])   4. Hyperlipidemia associated with type 2 diabetes mellitus ([place])   5. Morbid obesity with BMI of  45.0-49.9, adult ([place])   6. Gastroesophageal reflux disease without esophagitis   7. Osteoarthritis of both knees, unspecified osteoarthritis type   8. OSA on CPAP   9. History of prophylactic mastectomy of both breasts   10. 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 Sarcoidosis   12. Lung nodule seen on imaging study    Osteoarthritis Right > Left now. Must lose nearly 40 pounds in order to have surgery. Pain worsening.  Type 2 diabetes mellitus with other specified complication ([facility]) Previously diet controlled. Patient admits that she has been eating too much junk  food. No exercise due to right knee pain.   Lab Results  Component Value Date   HGBA1C 8.1 (A) 08/31/2018   HGBA1C 6.5 (H) 11/30/2017   HGBA1C 7.3 01/28/2017   Lab Results  Component Value Date   MICROALBUR 1.3 05/05/2017   LDLCALC 87 05/05/2017   CREATININE 0.67 08/23/2018    Hypertension associated with diabetes ([place]) Stable on current regimen. Continue.  Morbid obesity with BMI of 45.0-49.9, adult [facility]) Patient with metabolic syndrome, OSA, OA, and other obesity-related conditions. Has tried multiple methods of weight loss in the past. Most recently successful weight loss with Qsymia. Stopped just prior to COVID restrictions. She is interested in weight loss surgery options at this point. Will ask [facility] to evaluate patient to see if she is a candidate.   Hyperlipidemia associated with type 2 diabetes mellitus ([place]) Not on statin. Needs FLP. Unable to obtain today as lab closed prior to end of visit.   Orders Placed This Encounter  Procedures  . Amb Referral to Bariatric Surgery  . POCT glycosylated hemoglobin (Hb A1C)   Patient Counseling: [x]    Nutrition: Stressed importance of moderation in sodium/caffeine intake, saturated fat and cholesterol, caloric balance, sufficient intake of fresh fruits, vegetables, fiber, calcium, iron, and 1 mg of folate supplement per day (for females capable  of pregnancy).  [x]    Stressed the importance of regular exercise.   [x]    Substance Abuse: Discussed cessation/primary prevention of tobacco, alcohol, or other drug use; driving or other dangerous activities under the influence; availability of treatment for abuse.   [x]    Injury prevention: Discussed safety belts, safety helmets, smoke detector, smoking near bedding or upholstery.   [x]    Sexuality: Discussed sexually transmitted diseases, partner selection, use of condoms, avoidance of unintended pregnancy  and contraceptive alternatives.  [x]    Dental health: Discussed importance of regular tooth brushing, flossing, and dental visits.  [x]    Health maintenance and immunizations reviewed. Please refer to Health maintenance section.   Briscoe Deutscher, DO [place]

## 2018-08-31 ENCOUNTER — Ambulatory Visit (INDEPENDENT_AMBULATORY_CARE_PROVIDER_SITE_OTHER): Payer: 59 | Admitting: Family Medicine

## 2018-08-31 ENCOUNTER — Other Ambulatory Visit: Payer: Self-pay

## 2018-08-31 ENCOUNTER — Encounter: Payer: Self-pay | Admitting: Family Medicine

## 2018-08-31 VITALS — HR 114 | Temp 98.1°F | Ht 59.0 in | Wt 229.0 lb

## 2018-08-31 DIAGNOSIS — D869 Sarcoidosis, unspecified: Secondary | ICD-10-CM

## 2018-08-31 DIAGNOSIS — I152 Hypertension secondary to endocrine disorders: Secondary | ICD-10-CM

## 2018-08-31 DIAGNOSIS — I10 Essential (primary) hypertension: Secondary | ICD-10-CM | POA: Diagnosis not present

## 2018-08-31 DIAGNOSIS — M170 Bilateral primary osteoarthritis of knee: Secondary | ICD-10-CM

## 2018-08-31 DIAGNOSIS — Z0000 Encounter for general adult medical examination without abnormal findings: Secondary | ICD-10-CM | POA: Diagnosis not present

## 2018-08-31 DIAGNOSIS — E1169 Type 2 diabetes mellitus with other specified complication: Secondary | ICD-10-CM

## 2018-08-31 DIAGNOSIS — E1159 Type 2 diabetes mellitus with other circulatory complications: Secondary | ICD-10-CM | POA: Diagnosis not present

## 2018-08-31 DIAGNOSIS — G4733 Obstructive sleep apnea (adult) (pediatric): Secondary | ICD-10-CM

## 2018-08-31 DIAGNOSIS — Z9989 Dependence on other enabling machines and devices: Secondary | ICD-10-CM

## 2018-08-31 DIAGNOSIS — C7B8 Other secondary neuroendocrine tumors: Secondary | ICD-10-CM

## 2018-08-31 DIAGNOSIS — R911 Solitary pulmonary nodule: Secondary | ICD-10-CM

## 2018-08-31 DIAGNOSIS — Z9013 Acquired absence of bilateral breasts and nipples: Secondary | ICD-10-CM

## 2018-08-31 DIAGNOSIS — K219 Gastro-esophageal reflux disease without esophagitis: Secondary | ICD-10-CM

## 2018-08-31 DIAGNOSIS — Z6841 Body Mass Index (BMI) 40.0 and over, adult: Secondary | ICD-10-CM

## 2018-08-31 DIAGNOSIS — E785 Hyperlipidemia, unspecified: Secondary | ICD-10-CM

## 2018-08-31 LAB — POCT GLYCOSYLATED HEMOGLOBIN (HGB A1C): Hemoglobin A1C: 8.1 % — AB (ref 4.0–5.6)

## 2018-08-31 NOTE — Patient Instructions (Signed)
Health Maintenance Due  Topic Date Due  . COLONOSCOPY  02/10/2018  . FOOT EXAM  03/06/2018  . OPHTHALMOLOGY EXAM Pt had in 03/2018 05/09/2018  . HEMOGLOBIN A1C  06/01/2018  Will discuss during visit.  Depression screen [facility] 2/9 10/23/2017 05/05/2017 12/15/2016  Decreased Interest 2 0 0  Down, Depressed, Hopeless 3 0 0  PHQ - 2 Score 5 0 0  Altered sleeping 2 0 -  Tired, decreased energy 1 0 -  Change in appetite 2 0 -  Feeling bad or failure about yourself  1 0 -  Trouble concentrating 1 0 -  Moving slowly or fidgety/restless 1 0 -  Suicidal thoughts 0 0 -  PHQ-9 Score 13 0 -  Difficult doing work/chores Somewhat difficult Not difficult at all -  Some recent data might be hidden

## 2018-09-01 DIAGNOSIS — Z6841 Body Mass Index (BMI) 40.0 and over, adult: Secondary | ICD-10-CM | POA: Insufficient documentation

## 2018-09-01 DIAGNOSIS — E1169 Type 2 diabetes mellitus with other specified complication: Secondary | ICD-10-CM | POA: Insufficient documentation

## 2018-09-01 DIAGNOSIS — E785 Hyperlipidemia, unspecified: Secondary | ICD-10-CM | POA: Insufficient documentation

## 2018-09-01 NOTE — Assessment & Plan Note (Signed)
Previously diet controlled. Patient admits that she has been eating too much junk food. No exercise due to right knee pain.   Lab Results  Component Value Date   HGBA1C 8.1 (A) 08/31/2018   HGBA1C 6.5 (H) 11/30/2017   HGBA1C 7.3 01/28/2017   Lab Results  Component Value Date   MICROALBUR 1.3 05/05/2017   LDLCALC 87 05/05/2017   CREATININE 0.67 08/23/2018

## 2018-09-01 NOTE — Assessment & Plan Note (Signed)
Not on statin. Needs FLP. Unable to obtain today as lab closed prior to end of visit.

## 2018-09-01 NOTE — Assessment & Plan Note (Signed)
Stable on current regimen. Continue. ?

## 2018-09-01 NOTE — Assessment & Plan Note (Signed)
Right > Left now. Must lose nearly 40 pounds in order to have surgery. Pain worsening.

## 2018-09-01 NOTE — Assessment & Plan Note (Signed)
Patient with metabolic syndrome, OSA, OA, and other obesity-related conditions. Has tried multiple methods of weight loss in the past. Most recently successful weight loss with Qsymia. Stopped just prior to COVID restrictions. She is interested in weight loss surgery options at this point. Will ask [facility] to evaluate patient to see if she is a candidate.

## 2018-09-08 ENCOUNTER — Other Ambulatory Visit: Payer: Self-pay | Admitting: Family Medicine

## 2018-09-08 DIAGNOSIS — M4722 Other spondylosis with radiculopathy, cervical region: Secondary | ICD-10-CM

## 2018-09-08 MED ORDER — OXYCODONE HCL 5 MG PO TABS: 5.0000 mg | ORAL_TABLET | ORAL | 0 refills | Status: DC | PRN

## 2018-09-08 MED ORDER — GABAPENTIN 100 MG PO CAPS: 100.0000 mg | ORAL_CAPSULE | Freq: Every day | ORAL | 0 refills | Status: DC

## 2018-09-08 NOTE — Telephone Encounter (Signed)
Copied from [place] [id number]. Topic: Quick Communication - Rx Refill/Question >> Sep 08, 2018  9:13 AM Nils Flack wrote: Medication: gabapentin   Has the patient contacted their pharmacy? Yes.   (Agent: If no, request that the patient contact the pharmacy for the refill.) (Agent: If yes, when and what did the pharmacy advise?)  Preferred Pharmacy (with phone number or street name): [facility] Pt says it was discussed to increase dose at last visit, but has not been sent in   Also - pt requesting oxycodone for prn , says the old rx is expired  Agent: Please be advised that RX refills may take up to 3 business days. We ask that you follow-up with your pharmacy.

## 2018-09-08 NOTE — Telephone Encounter (Signed)
Patient last given 04/11/2016 1 every 4 hrs #30  Ok to fill?  Ready with gabapentin if ok  No f/u made.

## 2018-09-09 ENCOUNTER — Other Ambulatory Visit: Payer: Self-pay | Admitting: Orthopedic Surgery

## 2018-09-09 ENCOUNTER — Encounter: Payer: Self-pay | Admitting: Family Medicine

## 2018-09-09 DIAGNOSIS — M25561 Pain in right knee: Secondary | ICD-10-CM

## 2018-09-09 NOTE — Progress Notes (Signed)
Leah [name] Name: Leah [name], D.O. Family and Obesity Medicine [employer], [place]  To Whom It May Concern:  [facility] has been my patient since 2018. Leah [name] suffers from morbid obesity ICD-10 E66.01. After consultation with me, I am recommending that this patient should pursue bariatric surgery with [facility]. Leah [name] is a(n) 58 y.o. year old female, Body mass index is 46.25 kg/m.    Current Outpatient Medications:  .  amLODipine (NORVASC) 10 MG tablet, Take 1 tablet (10 mg total) by mouth daily., Disp: 90 tablet, Rfl: 0 .  aspirin 81 MG EC tablet, TAKE 1 TABLET BY MOUTH EVERY DAY, Disp: 90 tablet, Rfl: 1 .  clonazePAM (KLONOPIN) 0.5 MG tablet, TAKE 1 TABLET BY MOUTH TWICE A DAY AS NEEDED FOR ANXIETY, Disp: 30 tablet, Rfl: 1 .  fexofenadine (ALLEGRA) 180 MG tablet, Take 1 tablet (180 mg total) by mouth daily., Disp: 90 tablet, Rfl: 3 .  fluticasone (FLONASE) 50 MCG/ACT nasal spray, USE 2 SPRAYS IN BOTH NOSTRILS EVERY DAY (Patient taking differently: Place 2 sprays into both nostrils daily. ), Disp: 16 g, Rfl: 2 .  ipratropium (ATROVENT) 0.06 % nasal spray, PLACE 2 SPRAYS INTO BOTH NOSTRILS 4 (FOUR) TIMES DAILY., Disp: 15 mL, Rfl: 0 .  metoprolol succinate (TOPROL-XL) 100 MG 24 hr tablet, TAKE 1 TABLET BY MOUTH EVERY EVENING WITH OR IMMEDIATELY FOLLOWING A MEAL, Disp: 90 tablet, Rfl: 1 .  montelukast (SINGULAIR) 10 MG tablet, TAKE 1 TABLET BY MOUTH EVERYDAY AT BEDTIME, Disp: 90 tablet, Rfl: 1 .  naproxen (NAPROSYN) 500 MG tablet, Take 1 tablet (500 mg total) by mouth 2 (two) times daily with a meal., Disp: 180 tablet, Rfl: 0 .  octreotide (SANDOSTATIN LAR) 30 MG injection, Inject 30 mg into the muscle every 28 (twenty-eight) days., Disp: , Rfl:  .  ondansetron (ZOFRAN-ODT) 4 MG disintegrating tablet, DISSOLVE 1 TABLET BY MOUTH EVERY 8 HOURS AS NEEDED FOR NAUSEA AND VOMITING, Disp: 90 tablet, Rfl: 0 .  Ospemifene (OSPHENA) 60 MG TABS, Take 1 tablet by  mouth daily., Disp: 90 tablet, Rfl: 3 .  pantoprazole (PROTONIX) 40 MG tablet, Take 1 tablet (40 mg total) by mouth 2 (two) times daily., Disp: 180 tablet, Rfl: 0 .  PROAIR HFA 108 (90 Base) MCG/ACT inhaler, INHALE 2 PUFFS INTO THE LUNGS EVERY 6 (SIX) HOURS AS NEEDED. FOR SHORTNESS OF BREATH. (Patient taking differently: Inhale 2 puffs into the lungs every 6 (six) hours as needed for wheezing or shortness of breath. ), Disp: 6.7 Inhaler, Rfl: 4 .  valACYclovir (VALTREX) 500 MG tablet, TAKE 1 TABLET BY MOUTH TWICE A DAY AS NEEDED FOR COLD SORES, Disp: 180 tablet, Rfl: 1 .  valsartan (DIOVAN) 320 MG tablet, TAKE 1 TABLET (320 MG TOTAL) BY MOUTH DAILY., Disp: 90 tablet, Rfl: 1 .  venlafaxine XR (EFFEXOR-XR) 150 MG 24 hr capsule, TAKE 1 CAPSULE (150 MG TOTAL) BY MOUTH EACH MORNING, Disp: 90 capsule, Rfl: 1 .  Acyclovir-Hydrocortisone (XERESE) 5-1 % CREA, APPLY TO EFFECTED AREA (Patient not taking: Reported on 08/31/2018), Disp: 5 g, Rfl: 0 .  gabapentin (NEURONTIN) 100 MG capsule, Take 1 capsule (100 mg total) by mouth at bedtime., Disp: 90 capsule, Rfl: 0 .  oxyCODONE (OXY IR/ROXICODONE) 5 MG immediate release tablet, Take 1 tablet (5 mg total) by mouth every 4 (four) hours as needed for severe pain., Disp: 30 tablet, Rfl: 0 .  QSYMIA 7.5-46 MG CP24, TAKE 1 CAPSULE BY MOUTH EVERY DAY (Patient not taking:  Reported on 08/31/2018), Disp: 30 capsule, Rfl: [name] has tried multiple weight loss attempts including: Atkins, Slim Fast, [facility]ard Patience, [facility] with Medication, Self-Directed Diet, Self-Directed Exercise , Caloric Restriction and Weight Watchers.  This prolonged history of obesity has contributed to her medical conditions.  Patient Active Problem List   Diagnosis Date Noted  . Hyperlipidemia associated with type 2 diabetes mellitus ([place]) 09/01/2018  . Morbid obesity with BMI of 45.0-49.9, adult ([place]) 09/01/2018  . OSA on CPAP 05/04/2018  . Solitary pulmonary nodule 05/04/2018   . History of prophylactic mastectomy of both breasts 11/04/2017  . Atrophic vaginitis 04/12/2016  . Osteopenia 09/27/2015  . Branch retinal vein occlusion of left eye 12/12/2014  . Hypertensive retinopathy of both eyes 12/12/2014  . Lung nodule seen on imaging study 05/01/2014  . Type 2 diabetes mellitus with other specified complication ([place]) 12/87/8676  . Morbid obesity ([place]) 03/01/2013  . 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 Retinopathy 01/18/2013  . 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 Malignant neoplasm of breast ([place]) 01/08/2011  . 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 Hypertension associated with diabetes ([place]) 10/22/2010  . GERD (gastroesophageal reflux disease) 10/22/2010  . Allergic rhinitis 10/22/2010  . Sarcoidosis 10/22/2010  . Osteoarthritis 10/22/2010  . Anxiety 10/22/2010   All of [name]'s previous weight loss efforts have failed. I believe bariatric surgery would be a benefit to this patient's quality of life, I also believe this patient is capable of adapting to the new lifestyle changes required of bariatric surgery  I recommend bariatric surgery based on these reasons. Should you have any questions or need clarification please do not hesitate to contact me.  Sincerely,  Leah Deutscher, DO

## 2018-09-17 ENCOUNTER — Other Ambulatory Visit: Payer: Self-pay | Admitting: Family Medicine

## 2018-09-17 DIAGNOSIS — M4722 Other spondylosis with radiculopathy, cervical region: Secondary | ICD-10-CM

## 2018-09-17 NOTE — Telephone Encounter (Signed)
Last fill 04/12/18  #180/1 Last ov 08/31/18

## 2018-09-20 ENCOUNTER — Other Ambulatory Visit: Payer: Self-pay

## 2018-09-20 ENCOUNTER — Ambulatory Visit
Admission: RE | Admit: 2018-09-20 | Discharge: 2018-09-20 | Disposition: A | Discharge status: Home / Self Care | Payer: 59 | Source: Ambulatory Visit | Attending: Orthopedic Surgery | Admitting: Orthopedic Surgery

## 2018-09-20 DIAGNOSIS — M25561 Pain in right knee: Secondary | ICD-10-CM

## 2018-09-21 ENCOUNTER — Other Ambulatory Visit: Payer: Self-pay | Admitting: Family Medicine

## 2018-09-21 DIAGNOSIS — F419 Anxiety disorder, unspecified: Secondary | ICD-10-CM

## 2018-09-21 NOTE — Telephone Encounter (Signed)
Last OV: 08/31/18 Next OV: None scheduled at this time PMP website checked:  Last filled on 07/09/18

## 2018-09-23 ENCOUNTER — Inpatient Hospital Stay: Payer: 59 | Attending: Hematology & Oncology

## 2018-09-23 ENCOUNTER — Other Ambulatory Visit: Payer: Self-pay

## 2018-09-23 VITALS — BP 141/80 | HR 82 | Temp 97.7°F | Resp 20

## 2018-09-23 DIAGNOSIS — C7B02 Secondary carcinoid tumors of liver: Secondary | ICD-10-CM | POA: Diagnosis not present

## 2018-09-23 DIAGNOSIS — C7A00 Malignant carcinoid tumor of unspecified site: Secondary | ICD-10-CM | POA: Diagnosis present

## 2018-09-23 DIAGNOSIS — C7B8 Other secondary neuroendocrine tumors: Secondary | ICD-10-CM

## 2018-09-23 MED ORDER — LANREOTIDE ACETATE 120 MG/0.5ML ~~LOC~~ SOLN
120.0000 mg | Freq: Once | SUBCUTANEOUS | Status: AC
  Administered 2018-09-23: 120 mg via SUBCUTANEOUS

## 2018-09-23 MED ORDER — LANREOTIDE ACETATE 120 MG/0.5ML ~~LOC~~ SOLN
SUBCUTANEOUS | Status: AC
  Filled 2018-09-23: qty 120

## 2018-09-24 ENCOUNTER — Other Ambulatory Visit: Payer: Self-pay | Admitting: Family Medicine

## 2018-09-27 NOTE — Telephone Encounter (Signed)
Last fill 07/06/18  #180/0 Last OV 08/31/18

## 2018-10-15 ENCOUNTER — Other Ambulatory Visit: Payer: Self-pay | Admitting: Family Medicine

## 2018-10-15 DIAGNOSIS — F419 Anxiety disorder, unspecified: Secondary | ICD-10-CM

## 2018-10-19 DIAGNOSIS — Z9889 Other specified postprocedural states: Secondary | ICD-10-CM | POA: Insufficient documentation

## 2018-10-20 ENCOUNTER — Encounter: Payer: Self-pay | Admitting: Family Medicine

## 2018-10-22 ENCOUNTER — Inpatient Hospital Stay: Payer: 59 | Attending: Hematology & Oncology

## 2018-10-22 ENCOUNTER — Other Ambulatory Visit: Payer: Self-pay

## 2018-10-22 VITALS — BP 180/96 | HR 63 | Temp 97.1°F | Resp 17

## 2018-10-22 DIAGNOSIS — C7B02 Secondary carcinoid tumors of liver: Secondary | ICD-10-CM | POA: Diagnosis not present

## 2018-10-22 DIAGNOSIS — C7B8 Other secondary neuroendocrine tumors: Secondary | ICD-10-CM

## 2018-10-22 DIAGNOSIS — C7A00 Malignant carcinoid tumor of unspecified site: Secondary | ICD-10-CM | POA: Insufficient documentation

## 2018-10-22 MED ORDER — LANREOTIDE ACETATE 120 MG/0.5ML ~~LOC~~ SOLN
120.0000 mg | Freq: Once | SUBCUTANEOUS | Status: AC
  Administered 2018-10-22: 120 mg via SUBCUTANEOUS

## 2018-10-22 NOTE — Patient Instructions (Signed)
Lanreotide injection What is this medicine? LANREOTIDE (lan REE oh tide) is used to reduce blood levels of growth hormone in patients with a condition called acromegaly. It also works to slow or stop tumor growth in patients with neuroendocrine tumors and treat carcinoid syndrome. This medicine may be used for other purposes; ask your health care provider or pharmacist if you have questions. COMMON BRAND NAME(S): Somatuline [place] What should I tell my health care provider before I take this medicine? They need to know if you have any of these conditions:  diabetes  gallbladder disease  heart disease  kidney disease  liver disease  thyroid disease  an unusual or allergic reaction to lanreotide, other medicines, foods, dyes, or preservatives  pregnant or trying to get pregnant  breast-feeding How should I use this medicine? This medicine is for injection under the skin. It is given by a health care professional in a hospital or clinic setting. Contact your pediatrician or health care professional regarding the use of this medicine in children. Special care may be needed. Overdosage: If you think you have taken too much of this medicine contact a poison control center or emergency room at once. NOTE: This medicine is only for you. Do not share this medicine with others. What if I miss a dose? It is important not to miss your dose. Call your doctor or health care professional if you are unable to keep an appointment. What may interact with this medicine? This medicine may interact with the following medications:  bromocriptine  cyclosporine  certain medicines for blood pressure, heart disease, irregular heart beat  certain medicines for diabetes  quinidine  terfenadine This list may not describe all possible interactions. Give your health care provider a list of all the medicines, herbs, non-prescription drugs, or dietary supplements you use. Also tell them if you smoke,  drink alcohol, or use illegal drugs. Some items may interact with your medicine. What should I watch for while using this medicine? Tell your doctor or healthcare professional if your symptoms do not start to get better or if they get worse. Visit your doctor or health care professional for regular checks on your progress. Your condition will be monitored carefully while you are receiving this medicine. This medicine may increase blood sugar. Ask your healthcare provider if changes in diet or medicines are needed if you have diabetes. You may need blood work done while you are taking this medicine. Women should inform their doctor if they wish to become pregnant or think they might be pregnant. There is a potential for serious side effects to an unborn child. Talk to your health care professional or pharmacist for more information. Do not breast-feed an infant while taking this medicine or for 6 months after stopping it. This medicine has caused ovarian failure in some women. This medicine may interfere with the ability to have a child. Talk with your doctor or health care professional if you are concerned about your fertility. What side effects may I notice from receiving this medicine? Side effects that you should report to your doctor or health care professional as soon as possible:  allergic reactions like skin rash, itching or hives, swelling of the face, lips, or tongue  increased blood pressure  severe stomach pain  signs and symptoms of hgh blood sugar such as being more thirsty or hungry or having to urinate more than normal. You may also feel very tired or have blurry vision.  signs and symptoms of low blood   sugar such as feeling anxious; confusion; dizziness; increased hunger; unusually weak or tired; sweating; shakiness; cold; irritable; headache; blurred vision; fast heartbeat; loss of consciousness  unusually slow heartbeat Side effects that usually do not require medical  attention (report to your doctor or health care professional if they continue or are bothersome):  constipation  diarrhea  dizziness  headache  muscle pain  muscle spasms  nausea  pain, redness, or irritation at site where injected This list may not describe all possible side effects. Call your doctor for medical advice about side effects. You may report side effects to FDA at 1-800-FDA-1088. Where should I keep my medicine? This drug is given in a hospital or clinic and will not be stored at home. NOTE: This sheet is a summary. It may not cover all possible information. If you have questions about this medicine, talk to your doctor, pharmacist, or health care provider.  2020 [employer]vier/Gold Standard (2017-11-05 09:13:08)  

## 2018-10-25 MED ORDER — QSYMIA 7.5-46 MG PO CP24: 1.0000 | ORAL_CAPSULE | Freq: Every day | ORAL | 1 refills | Status: DC

## 2018-10-25 NOTE — Telephone Encounter (Signed)
Dr. Juleen China, I have loaded the Rx please send to pharmacy. Thanks

## 2018-11-10 ENCOUNTER — Encounter: Payer: Self-pay | Admitting: Family Medicine

## 2018-11-16 ENCOUNTER — Other Ambulatory Visit: Payer: Self-pay | Admitting: Family Medicine

## 2018-11-16 NOTE — Telephone Encounter (Signed)
Last fill 10/15/18  #15ML/0 Last OV 08/31/18

## 2018-11-19 ENCOUNTER — Other Ambulatory Visit: Payer: Self-pay | Admitting: Family Medicine

## 2018-11-19 DIAGNOSIS — F419 Anxiety disorder, unspecified: Secondary | ICD-10-CM

## 2018-11-22 ENCOUNTER — Other Ambulatory Visit: Payer: Self-pay

## 2018-11-22 ENCOUNTER — Inpatient Hospital Stay: Payer: 59

## 2018-11-22 ENCOUNTER — Inpatient Hospital Stay: Payer: 59 | Attending: Hematology & Oncology

## 2018-11-22 ENCOUNTER — Encounter: Payer: Self-pay | Admitting: Family

## 2018-11-22 ENCOUNTER — Inpatient Hospital Stay (HOSPITAL_BASED_OUTPATIENT_CLINIC_OR_DEPARTMENT_OTHER): Payer: 59 | Admitting: Family

## 2018-11-22 VITALS — BP 129/64 | HR 65 | Temp 97.6°F | Resp 17 | Ht 59.0 in | Wt 222.4 lb

## 2018-11-22 DIAGNOSIS — C7A00 Malignant carcinoid tumor of unspecified site: Secondary | ICD-10-CM | POA: Diagnosis present

## 2018-11-22 DIAGNOSIS — C7B02 Secondary carcinoid tumors of liver: Secondary | ICD-10-CM | POA: Insufficient documentation

## 2018-11-22 DIAGNOSIS — C7B8 Other secondary neuroendocrine tumors: Secondary | ICD-10-CM | POA: Diagnosis not present

## 2018-11-22 LAB — CBC WITH DIFFERENTIAL (CANCER CENTER ONLY)
Abs Immature Granulocytes: 0.04 10*3/uL (ref 0.00–0.07)
Basophils Absolute: 0.1 10*3/uL (ref 0.0–0.1)
Basophils Relative: 1 %
Eosinophils Absolute: 0.2 10*3/uL (ref 0.0–0.5)
Eosinophils Relative: 1 %
HCT: 41.7 % (ref 36.0–46.0)
Hemoglobin: 13 g/dL (ref 12.0–15.0)
Immature Granulocytes: 0 %
Lymphocytes Relative: 31 %
Lymphs Abs: 3.4 10*3/uL (ref 0.7–4.0)
MCH: 28.4 pg (ref 26.0–34.0)
MCHC: 31.2 g/dL (ref 30.0–36.0)
MCV: 91.2 fL (ref 80.0–100.0)
Monocytes Absolute: 0.7 10*3/uL (ref 0.1–1.0)
Monocytes Relative: 7 %
Neutro Abs: 6.5 10*3/uL (ref 1.7–7.7)
Neutrophils Relative %: 60 %
Platelet Count: 255 10*3/uL (ref 150–400)
RBC: 4.57 MIL/uL (ref 3.87–5.11)
RDW: 13.8 % (ref 11.5–15.5)
WBC Count: 10.9 10*3/uL — ABNORMAL HIGH (ref 4.0–10.5)
nRBC: 0 % (ref 0.0–0.2)

## 2018-11-22 LAB — CMP (CANCER CENTER ONLY)
ALT: 19 U/L (ref 0–44)
AST: 18 U/L (ref 15–41)
Albumin: 3.9 g/dL (ref 3.5–5.0)
Alkaline Phosphatase: 92 U/L (ref 38–126)
Anion gap: 9 (ref 5–15)
BUN: 19 mg/dL (ref 6–20)
CO2: 25 mmol/L (ref 22–32)
Calcium: 9.1 mg/dL (ref 8.9–10.3)
Chloride: 107 mmol/L (ref 98–111)
Creatinine: 0.81 mg/dL (ref 0.44–1.00)
GFR, Est AFR Am: >60 mL/min (ref >60)
GFR, Estimated: >60 mL/min (ref >60)
Glucose, Bld: 124 mg/dL — ABNORMAL HIGH (ref 70–99)
Potassium: 4.2 mmol/L (ref 3.5–5.1)
Sodium: 141 mmol/L (ref 135–145)
Total Bilirubin: 0.4 mg/dL (ref 0.3–1.2)
Total Protein: 7.1 g/dL (ref 6.5–8.1)

## 2018-11-22 MED ORDER — LANREOTIDE ACETATE 120 MG/0.5ML ~~LOC~~ SOLN
SUBCUTANEOUS | Status: AC
  Filled 2018-11-22: qty 120

## 2018-11-22 MED ORDER — LANREOTIDE ACETATE 120 MG/0.5ML ~~LOC~~ SOLN
120.0000 mg | Freq: Once | SUBCUTANEOUS | Status: AC
  Administered 2018-11-22: 120 mg via SUBCUTANEOUS

## 2018-11-22 NOTE — Progress Notes (Signed)
Hematology and Oncology Follow Up Visit  Leah [name] [id number] 1960/06/04 58 y.o. 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 She had constipation and nausea a couple weeks after her last injection.  She is asymptomatic at this time.  She follows up with Dr. Crisoforo Oxford in November before Thanksgiving and will have an MRI of the liver at that time.  Chromogranin A level was 100.8 in July. Prior to that was 83.2.  No fever, chills, n/v, cough, rash, dizziness, SOB, chest pain, palpitations, abdominal pain or changes in bowel or bladder habits.  No numbness or tingling in her extremities at this time.  Her pain in her knees that waxes and wanes. No swelling noted on exam.  No bleeding, bruising or petechiae.  She has maintained a good appetite and is staying well hydrated. Her weight is stable.  ECOG Performance Status: 1 - Symptomatic but completely ambulatory  Medications:  Allergies as of 11/22/2018   No Known Allergies     Medication List       Accurate as of November 22, 2018 10:45 AM. If you have any questions, ask your nurse or doctor.        Acyclovir-Hydrocortisone 5-1 % Crea Commonly known as: Xerese APPLY TO EFFECTED AREA   amLODipine 10 MG tablet Commonly known as: NORVASC Take 1 tablet (10 mg total) by mouth daily.   aspirin 81 MG EC tablet TAKE 1 TABLET BY MOUTH EVERY DAY   clonazePAM 0.5 MG tablet Commonly known as: KLONOPIN TAKE 1 TABLET BY MOUTH TWICE A DAY AS NEEDED FOR ANXIETY   fexofenadine 180 MG tablet Commonly known as: ALLEGRA Take 1 tablet (180 mg total) by mouth daily.   fluticasone 50 MCG/ACT nasal spray Commonly known as: FLONASE USE 2 SPRAYS IN BOTH NOSTRILS EVERY DAY What changed: See the new  instructions.   gabapentin 100 MG capsule Commonly known as: NEURONTIN Take 1 capsule (100 mg total) by mouth at bedtime.   ipratropium 0.06 % nasal spray Commonly known as: ATROVENT USE 2 SPRAYS IN EACH NOSTRIL 4 TIMES A DAY   metoprolol succinate 100 MG 24 hr tablet Commonly known as: TOPROL-XL TAKE 1 TABLET BY MOUTH EVERY EVENING WITH OR IMMEDIATELY FOLLOWING A MEAL   montelukast 10 MG tablet Commonly known as: SINGULAIR TAKE 1 TABLET BY MOUTH EVERYDAY AT BEDTIME   naproxen 500 MG tablet Commonly known as: NAPROSYN TAKE 1 TABLET TWICE A DAY WITH A MEAL   octreotide 30 MG injection Commonly known as: SANDOSTATIN LAR Inject 30 mg into the muscle every 28 (twenty-eight) days.   ondansetron 4 MG disintegrating tablet Commonly known as: ZOFRAN-ODT DISSOLVE 1 TABLET BY MOUTH EVERY 8 HOURS AS NEEDED FOR NAUSEA AND VOMITING   Ospemifene 60 MG Tabs Commonly known as: Osphena Take 1 tablet by mouth daily.   oxyCODONE 5 MG immediate release tablet Commonly known as: Oxy IR/ROXICODONE Take 1 tablet (5 mg total) by mouth every 4 (four) hours as needed for severe pain.   pantoprazole 40 MG tablet Commonly known as: PROTONIX TAKE 1 TABLET BY MOUTH TWICE A DAY   ProAir HFA 108 (90 Base) MCG/ACT inhaler Generic drug: albuterol INHALE 2 PUFFS INTO THE LUNGS EVERY 6 (SIX) HOURS AS NEEDED. FOR SHORTNESS OF BREATH. What changed: See the new instructions.  Qsymia 7.5-46 MG Cp24 Generic drug: Phentermine-Topiramate Take 1 tablet by mouth daily.   valACYclovir 500 MG tablet Commonly known as: VALTREX TAKE 1 TABLET BY MOUTH TWICE A DAY AS NEEDED FOR COLD SORES   valsartan 320 MG tablet Commonly known as: DIOVAN TAKE 1 TABLET BY MOUTH EVERY DAY   venlafaxine XR 150 MG 24 hr capsule Commonly known as: EFFEXOR-XR TAKE 1 CAPSULE (150 MG TOTAL) BY MOUTH EACH MORNING       Allergies: No Known Allergies  Past Medical History, Surgical history, Social history, and Family  History were reviewed and updated.  Review of Systems: All other 10 point review of systems is negative.   Physical Exam:  vitals were not taken for this visit.   Wt Readings from Last 3 Encounters:  08/31/18 229 lb (103.9 kg)  08/23/18 229 lb 0.6 oz (103.9 kg)  05/18/18 222 lb 12.8 oz (101.1 kg)    Ocular: Sclerae unicteric, pupils equal, round and reactive to light Ear-nose-throat: Oropharynx clear, dentition fair Lymphatic: No cervical or supraclavicular adenopathy Lungs no rales or rhonchi, good excursion bilaterally Heart regular rate and rhythm, no murmur appreciated Abd soft, nontender, positive bowel sounds, no liver or spleen tip palpated on exam, no fluid wave  MSK no focal spinal tenderness, no joint edema Neuro: non-focal, well-oriented, appropriate affect Breasts: Deferred   Lab Results  Component Value Date   WBC 10.5 08/23/2018   HGB 12.8 08/23/2018   HCT 39.9 08/23/2018   MCV 91.9 08/23/2018   PLT 245 08/23/2018   Lab Results  Component Value Date   FERRITIN 382 (H) 10/18/2008   IRON <10 (L) 10/18/2008   TIBC NOT CALC Not calculated due to Iron <10. 10/18/2008   UIBC 156 10/18/2008   IRONPCTSAT NOT CALC Not calculated due to Iron <10. 10/18/2008   Lab Results  Component Value Date   RETICCTPCT 0.9 10/18/2008   RBC 4.34 08/23/2018   No results found for: KPAFRELGTCHN, LAMBDASER, KAPLAMBRATIO No results found for: IGGSERUM, IGA, IGMSERUM No results found for: Odetta Pink, SPEI   Chemistry      Component Value Date/Time   NA 138 08/23/2018 1041   NA 140 01/20/2017 1416   NA 141 01/17/2016 1047   K 4.3 08/23/2018 1041   K 3.9 01/20/2017 1416   K 3.2 (L) 01/17/2016 1047   CL 104 08/23/2018 1041   CL 102 01/20/2017 1416   CO2 25 08/23/2018 1041   CO2 26 01/20/2017 1416   CO2 27 01/17/2016 1047   BUN 19 08/23/2018 1041   BUN 15 01/20/2017 1416   BUN 6.6 (L) 01/17/2016 1047   CREATININE  0.67 08/23/2018 1041   CREATININE 1.1 01/20/2017 1416   CREATININE 0.7 01/17/2016 1047      Component Value Date/Time   CALCIUM 8.1 (L) 08/23/2018 1041   CALCIUM 9.1 01/20/2017 1416   CALCIUM 8.7 01/17/2016 1047   ALKPHOS 97 08/23/2018 1041   ALKPHOS 84 01/20/2017 1416   ALKPHOS 90 01/17/2016 1047   AST 19 08/23/2018 1041   AST 34 01/17/2016 1047   ALT 24 08/23/2018 1041   ALT 35 01/20/2017 1416   ALT 38 01/17/2016 1047   BILITOT 0.4 08/23/2018 1041   BILITOT 0.42 01/17/2016 1047       Impression and Plan: Leah [name] is a very pleasant 58 yo caucasian female withlong standing history ofmetastaticcarcinoidwith recurrent liver metastasis. We will proceed with Somatuline injection as planned.  She follows up  with Dr. Crisoforo Oxford in 1 month and will have her repeat MRI of the liver at that time.  Chromogranin A level redrawn today and result is pending.  She will stay on her monthly injection schedule and MD follow-up in 2 months. She will contact our office with any questions or concerns. We can certainly see her sooner if needed.    Laverna Peace, NP 10/12/202010:45 AM

## 2018-11-22 NOTE — Patient Instructions (Signed)
Lanreotide injection What is this medicine? LANREOTIDE (lan REE oh tide) is used to reduce blood levels of growth hormone in patients with a condition called acromegaly. It also works to slow or stop tumor growth in patients with neuroendocrine tumors and treat carcinoid syndrome. This medicine may be used for other purposes; ask your health care provider or pharmacist if you have questions. COMMON BRAND NAME(S): Somatuline [place] What should I tell my health care provider before I take this medicine? They need to know if you have any of these conditions:  diabetes  gallbladder disease  heart disease  kidney disease  liver disease  thyroid disease  an unusual or allergic reaction to lanreotide, other medicines, foods, dyes, or preservatives  pregnant or trying to get pregnant  breast-feeding How should I use this medicine? This medicine is for injection under the skin. It is given by a health care professional in a hospital or clinic setting. Contact your pediatrician or health care professional regarding the use of this medicine in children. Special care may be needed. Overdosage: If you think you have taken too much of this medicine contact a poison control center or emergency room at once. NOTE: This medicine is only for you. Do not share this medicine with others. What if I miss a dose? It is important not to miss your dose. Call your doctor or health care professional if you are unable to keep an appointment. What may interact with this medicine? This medicine may interact with the following medications:  bromocriptine  cyclosporine  certain medicines for blood pressure, heart disease, irregular heart beat  certain medicines for diabetes  quinidine  terfenadine This list may not describe all possible interactions. Give your health care provider a list of all the medicines, herbs, non-prescription drugs, or dietary supplements you use. Also tell them if you smoke,  drink alcohol, or use illegal drugs. Some items may interact with your medicine. What should I watch for while using this medicine? Tell your doctor or healthcare professional if your symptoms do not start to get better or if they get worse. Visit your doctor or health care professional for regular checks on your progress. Your condition will be monitored carefully while you are receiving this medicine. This medicine may increase blood sugar. Ask your healthcare provider if changes in diet or medicines are needed if you have diabetes. You may need blood work done while you are taking this medicine. Women should inform their doctor if they wish to become pregnant or think they might be pregnant. There is a potential for serious side effects to an unborn child. Talk to your health care professional or pharmacist for more information. Do not breast-feed an infant while taking this medicine or for 6 months after stopping it. This medicine has caused ovarian failure in some women. This medicine may interfere with the ability to have a child. Talk with your doctor or health care professional if you are concerned about your fertility. What side effects may I notice from receiving this medicine? Side effects that you should report to your doctor or health care professional as soon as possible:  allergic reactions like skin rash, itching or hives, swelling of the face, lips, or tongue  increased blood pressure  severe stomach pain  signs and symptoms of hgh blood sugar such as being more thirsty or hungry or having to urinate more than normal. You may also feel very tired or have blurry vision.  signs and symptoms of low blood   sugar such as feeling anxious; confusion; dizziness; increased hunger; unusually weak or tired; sweating; shakiness; cold; irritable; headache; blurred vision; fast heartbeat; loss of consciousness  unusually slow heartbeat Side effects that usually do not require medical  attention (report to your doctor or health care professional if they continue or are bothersome):  constipation  diarrhea  dizziness  headache  muscle pain  muscle spasms  nausea  pain, redness, or irritation at site where injected This list may not describe all possible side effects. Call your doctor for medical advice about side effects. You may report side effects to FDA at 1-800-FDA-1088. Where should I keep my medicine? This drug is given in a hospital or clinic and will not be stored at home. NOTE: This sheet is a summary. It may not cover all possible information. If you have questions about this medicine, talk to your doctor, pharmacist, or health care provider.  2020 [employer]vier/Gold Standard (2017-11-05 09:13:08)  

## 2018-11-24 LAB — CHROMOGRANIN A: Chromogranin A (ng/mL): 84.6 ng/mL (ref 0.0–101.8)

## 2018-11-24 NOTE — Telephone Encounter (Signed)
Last given 09/22/18 Last app 08/31/18  No f/u

## 2018-12-03 ENCOUNTER — Other Ambulatory Visit: Payer: Self-pay | Admitting: Family Medicine

## 2018-12-12 ENCOUNTER — Other Ambulatory Visit: Payer: Self-pay | Admitting: Family Medicine

## 2018-12-20 ENCOUNTER — Inpatient Hospital Stay: Payer: 59 | Attending: Hematology & Oncology

## 2018-12-20 ENCOUNTER — Other Ambulatory Visit: Payer: Self-pay

## 2018-12-20 VITALS — BP 149/96 | HR 95 | Temp 97.0°F | Resp 18 | Ht 59.0 in | Wt 228.0 lb

## 2018-12-20 DIAGNOSIS — C7B8 Other secondary neuroendocrine tumors: Secondary | ICD-10-CM

## 2018-12-20 DIAGNOSIS — C7B02 Secondary carcinoid tumors of liver: Secondary | ICD-10-CM | POA: Insufficient documentation

## 2018-12-20 DIAGNOSIS — C7A00 Malignant carcinoid tumor of unspecified site: Secondary | ICD-10-CM | POA: Insufficient documentation

## 2018-12-20 MED ORDER — LANREOTIDE ACETATE 120 MG/0.5ML ~~LOC~~ SOLN
120.0000 mg | Freq: Once | SUBCUTANEOUS | Status: AC
  Administered 2018-12-20: 120 mg via SUBCUTANEOUS

## 2018-12-20 NOTE — Patient Instructions (Signed)
Lanreotide injection What is this medicine? LANREOTIDE (lan REE oh tide) is used to reduce blood levels of growth hormone in patients with a condition called acromegaly. It also works to slow or stop tumor growth in patients with neuroendocrine tumors and treat carcinoid syndrome. This medicine may be used for other purposes; ask your health care provider or pharmacist if you have questions. COMMON BRAND NAME(S): Somatuline [place] What should I tell my health care provider before I take this medicine? They need to know if you have any of these conditions:  diabetes  gallbladder disease  heart disease  kidney disease  liver disease  thyroid disease  an unusual or allergic reaction to lanreotide, other medicines, foods, dyes, or preservatives  pregnant or trying to get pregnant  breast-feeding How should I use this medicine? This medicine is for injection under the skin. It is given by a health care professional in a hospital or clinic setting. Contact your pediatrician or health care professional regarding the use of this medicine in children. Special care may be needed. Overdosage: If you think you have taken too much of this medicine contact a poison control center or emergency room at once. NOTE: This medicine is only for you. Do not share this medicine with others. What if I miss a dose? It is important not to miss your dose. Call your doctor or health care professional if you are unable to keep an appointment. What may interact with this medicine? This medicine may interact with the following medications:  bromocriptine  cyclosporine  certain medicines for blood pressure, heart disease, irregular heart beat  certain medicines for diabetes  quinidine  terfenadine This list may not describe all possible interactions. Give your health care provider a list of all the medicines, herbs, non-prescription drugs, or dietary supplements you use. Also tell them if you smoke,  drink alcohol, or use illegal drugs. Some items may interact with your medicine. What should I watch for while using this medicine? Tell your doctor or healthcare professional if your symptoms do not start to get better or if they get worse. Visit your doctor or health care professional for regular checks on your progress. Your condition will be monitored carefully while you are receiving this medicine. This medicine may increase blood sugar. Ask your healthcare provider if changes in diet or medicines are needed if you have diabetes. You may need blood work done while you are taking this medicine. Women should inform their doctor if they wish to become pregnant or think they might be pregnant. There is a potential for serious side effects to an unborn child. Talk to your health care professional or pharmacist for more information. Do not breast-feed an infant while taking this medicine or for 6 months after stopping it. This medicine has caused ovarian failure in some women. This medicine may interfere with the ability to have a child. Talk with your doctor or health care professional if you are concerned about your fertility. What side effects may I notice from receiving this medicine? Side effects that you should report to your doctor or health care professional as soon as possible:  allergic reactions like skin rash, itching or hives, swelling of the face, lips, or tongue  increased blood pressure  severe stomach pain  signs and symptoms of hgh blood sugar such as being more thirsty or hungry or having to urinate more than normal. You may also feel very tired or have blurry vision.  signs and symptoms of low blood   sugar such as feeling anxious; confusion; dizziness; increased hunger; unusually weak or tired; sweating; shakiness; cold; irritable; headache; blurred vision; fast heartbeat; loss of consciousness  unusually slow heartbeat Side effects that usually do not require medical  attention (report to your doctor or health care professional if they continue or are bothersome):  constipation  diarrhea  dizziness  headache  muscle pain  muscle spasms  nausea  pain, redness, or irritation at site where injected This list may not describe all possible side effects. Call your doctor for medical advice about side effects. You may report side effects to FDA at 1-800-FDA-1088. Where should I keep my medicine? This drug is given in a hospital or clinic and will not be stored at home. NOTE: This sheet is a summary. It may not cover all possible information. If you have questions about this medicine, talk to your doctor, pharmacist, or health care provider.  2020 [employer]vier/Gold Standard (2017-11-05 09:13:08)  

## 2018-12-21 ENCOUNTER — Encounter: Payer: Self-pay | Admitting: Family Medicine

## 2018-12-21 NOTE — Telephone Encounter (Signed)
Pt requesting refills. Has TOC appt with you on 12/23/2018.

## 2018-12-22 ENCOUNTER — Other Ambulatory Visit: Payer: Self-pay | Admitting: Family Medicine

## 2018-12-22 DIAGNOSIS — M4722 Other spondylosis with radiculopathy, cervical region: Secondary | ICD-10-CM

## 2018-12-22 DIAGNOSIS — I10 Essential (primary) hypertension: Secondary | ICD-10-CM

## 2018-12-22 MED ORDER — ASPIRIN 81 MG PO TBEC: 81.0000 mg | DELAYED_RELEASE_TABLET | Freq: Every day | ORAL | 1 refills | Status: DC

## 2018-12-22 MED ORDER — NAPROXEN 500 MG PO TABS: ORAL_TABLET | ORAL | 0 refills | Status: DC

## 2018-12-23 ENCOUNTER — Encounter: Payer: Self-pay | Admitting: Family Medicine

## 2018-12-23 ENCOUNTER — Other Ambulatory Visit: Payer: Self-pay

## 2018-12-23 ENCOUNTER — Ambulatory Visit (INDEPENDENT_AMBULATORY_CARE_PROVIDER_SITE_OTHER): Payer: 59 | Admitting: Family Medicine

## 2018-12-23 VITALS — Temp 97.7°F | Ht 59.0 in | Wt 223.0 lb

## 2018-12-23 DIAGNOSIS — E785 Hyperlipidemia, unspecified: Secondary | ICD-10-CM

## 2018-12-23 DIAGNOSIS — E1159 Type 2 diabetes mellitus with other circulatory complications: Secondary | ICD-10-CM | POA: Diagnosis not present

## 2018-12-23 DIAGNOSIS — I152 Hypertension secondary to endocrine disorders: Secondary | ICD-10-CM

## 2018-12-23 DIAGNOSIS — I10 Essential (primary) hypertension: Secondary | ICD-10-CM | POA: Diagnosis not present

## 2018-12-23 DIAGNOSIS — E1169 Type 2 diabetes mellitus with other specified complication: Secondary | ICD-10-CM

## 2018-12-23 MED ORDER — AMLODIPINE BESYLATE 10 MG PO TABS: 10.0000 mg | ORAL_TABLET | Freq: Every day | ORAL | 0 refills | Status: DC

## 2018-12-23 NOTE — Progress Notes (Signed)
Patient: Leah [name] MRN: [id number] DOB: [birth year] PCP: Briscoe Deutscher, DO     I connected with Silvano Bilis on 12/23/18 at 8:26am by a video enabled telemedicine application and verified that I am speaking with the correct person using two identifiers.  Location patient: Home Location provider: [facility], Office Persons participating in this virtual visit: Jaylanis Roccaforte and Dr. Rogers Blocker   I discussed the limitations of evaluation and management by telemedicine and the availability of in person appointments. The patient expressed understanding and agreed to proceed.   Subjective:  Chief Complaint  Patient presents with  . Transitions Of Care  . Diabetes  . Medication Refill    HPI: The patient is a 58 y.o. female who presents today for transition of care. She has multiple health issues and we will address her diabetes/cholesterol/htn today. Also went throught her chart with extensive review of her problem list, meds and HM.   Hypertension: Here for follow up of hypertension.  Currently on amlodipine,alsartan Takes medication as prescribed and denies any side effects. Exercise includes none at this time due to covid. Weight has been stable. Denies any chest pain, headaches, shortness of breath, vision changes, swelling in lower extremities.   Diabetes: Patient is here for follow up of type 2 diabetes. First diagnosed about a year ago.  Currently on the following medications none. Last A1C was 8.1 Currently NOT exercising and NOT  following diabetic diet. Denies any hypoglycemic events. Denies any vision changes, nausea, vomiting, abdominal pain, ulcers/paraesthesia in feet, polyuria, polydipsia or polyphagia. Denies any chest pain, shortness of breath.  Last a1c was 8.1 and nothing was done with her previous pcp. She can't remember why treatment was not started.   Hyperlipidemia: due for fasting lipid panel. She doesn't smoke. Her dad had a massive heart attack at 47 years of age  (heavy smoker). She does have diabetes, not on a statin.     Review of Systems  Constitutional: Negative for fatigue.  HENT: Negative for postnasal drip and rhinorrhea.   Eyes: Negative for visual disturbance.  Respiratory: Negative for shortness of breath.   Cardiovascular: Negative for chest pain, palpitations and leg swelling.  Gastrointestinal: Negative for abdominal pain, diarrhea, nausea and vomiting.  Endocrine: Negative for cold intolerance, heat intolerance, polydipsia and polyuria.  Genitourinary: Negative for dysuria, frequency and urgency.  Neurological: Negative for dizziness and headaches.  Psychiatric/Behavioral: Negative for sleep disturbance.    Allergies Patient has No Known Allergies.  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  Surgical History Patient  has a past surgical history that includes Tonsillectomy (1976); Cesarean section [id number]); Cholecystectomy (2006); Abdominal hysterectomy (2004); Oophorectomy (2004); Tubal ligation (Bilateral, 2001); Knee arthroscopy (Left, 09/2010); Breast biopsy (Left); Reconstruction breast w/ latissimus dorsi flap (Bilateral); Laparoscopic hepatectomy; Augmentation mammaplasty; and Mastectomy (Bilateral, 2008).  Family History Pateint's family history includes Arthritis in her maternal grandfather, maternal grandmother, paternal grandfather, and paternal grandmother; Breast cancer (age of onset: 48) in her maternal aunt; Breast cancer (age of onset: 39) in her mother; Breast cancer (age of onset: 45) in her maternal aunt; Heart failure in her  father and paternal grandfather; Hyperlipidemia in her father; Hypertension in her father; Lupus in her mother;  Ovarian cancer (age of onset: 103) in her maternal aunt.  Social History Patient  reports that she has never smoked. She has never used smokeless tobacco. She reports that she does not drink alcohol or use drugs.    Objective: Vitals:   12/23/18 0816  Temp: 97.7 F (36.5 C)  TempSrc: Skin  Weight: 223 lb (101.2 kg)  Height: 4\' 11"  (1.499 m)    Body mass index is 45.04 kg/m.  Physical Exam Vitals signs reviewed.  Constitutional:      Appearance: Normal appearance. She is obese.  HENT:     Head: Normocephalic and atraumatic.  Pulmonary:     Effort: Pulmonary effort is normal.  Neurological:     General: No focal deficit present.     Mental Status: She is alert and oriented to person, place, and time.        Assessment/plan: 1. Type 2 diabetes mellitus with other specified complication, unspecified whether long term insulin use (HCC) a1c was out of control on last check in July. Unsure why PCP did not start treatment. Due for labs and will have her come in for this. Repeat a1c and will start treatment accordingly. Diet, exercise and weight loss will be instrumental in treatment for her chronic conditions. Foot exam at in office f/u. utd on immunizations. Did discuss need for statin, but will wait for labs to come back.   - Hemoglobin A1c; Future - Microalbumin / creatinine urine ratio; Future - TSH; Future  2. Hyperlipidemia associated with type 2 diabetes mellitus ([place]) Needs statin with diabetes. Did discuss recommendations with her over this. Will wait for labs before initiating therapy  - Lipid panel; Future  3. Hypertension associated with diabetes ([place]) Not in office, unsure of what she runs normally. States it's well controlled. Would like to see her for in office appointment to monitor blood pressure, weight and do an exam. Ordered labs that still need to be  done that are checked at cancer center. Recommended weight loss and exercise.    Return in about 1 week (around 12/30/2018) for labs then f/u appointment to discuss treatment .  Records requested if needed. Time spent with patient: >35 minutes, of which >50% was spent in obtaining information about her symptoms, reviweing her previous labs, evaluations, and treatments, counseling her about her conditions (please see discussed topics above), and developing a plan to further investigate it; she had a number of questions which I addressed.    Orma Flaming, MD [facility]  12/23/2018

## 2018-12-24 ENCOUNTER — Other Ambulatory Visit (INDEPENDENT_AMBULATORY_CARE_PROVIDER_SITE_OTHER): Payer: 59

## 2018-12-24 DIAGNOSIS — E1169 Type 2 diabetes mellitus with other specified complication: Secondary | ICD-10-CM | POA: Diagnosis not present

## 2018-12-24 DIAGNOSIS — E785 Hyperlipidemia, unspecified: Secondary | ICD-10-CM

## 2018-12-24 LAB — LIPID PANEL
Cholesterol: 185 mg/dL (ref 0–200)
HDL: 53.8 mg/dL (ref >39.00)
LDL Cholesterol: 102 mg/dL — ABNORMAL HIGH (ref 0–99)
NonHDL: 131.26
Total CHOL/HDL Ratio: 3
Triglycerides: 146 mg/dL (ref 0.0–149.0)
VLDL: 29.2 mg/dL (ref 0.0–40.0)

## 2018-12-24 LAB — MICROALBUMIN / CREATININE URINE RATIO
Creatinine,U: 203.3 mg/dL
Microalb Creat Ratio: 1.5 mg/g (ref 0.0–30.0)
Microalb, Ur: 3 mg/dL — ABNORMAL HIGH (ref 0.0–1.9)

## 2018-12-24 LAB — HEMOGLOBIN A1C: Hgb A1c MFr Bld: 7.1 % — ABNORMAL HIGH (ref 4.6–6.5)

## 2018-12-24 LAB — TSH: TSH: 4.64 u[IU]/mL — ABNORMAL HIGH (ref 0.35–4.50)

## 2018-12-29 ENCOUNTER — Other Ambulatory Visit: Payer: Self-pay | Admitting: Family Medicine

## 2018-12-30 NOTE — Telephone Encounter (Signed)
Last OV 12/23/18 Last refill 12/07/18 #30/0 Next OV 02/02/19

## 2019-01-03 ENCOUNTER — Encounter: Payer: Self-pay | Admitting: Family Medicine

## 2019-01-03 DIAGNOSIS — K760 Fatty (change of) liver, not elsewhere classified: Secondary | ICD-10-CM | POA: Insufficient documentation

## 2019-01-11 ENCOUNTER — Other Ambulatory Visit: Payer: Self-pay | Admitting: Family Medicine

## 2019-01-15 ENCOUNTER — Other Ambulatory Visit: Payer: Self-pay | Admitting: Family Medicine

## 2019-01-15 DIAGNOSIS — F419 Anxiety disorder, unspecified: Secondary | ICD-10-CM

## 2019-01-17 ENCOUNTER — Encounter: Payer: Self-pay | Admitting: Hematology & Oncology

## 2019-01-17 ENCOUNTER — Inpatient Hospital Stay: Payer: Managed Care, Other (non HMO)

## 2019-01-17 ENCOUNTER — Inpatient Hospital Stay: Payer: Managed Care, Other (non HMO) | Attending: Hematology & Oncology | Admitting: Hematology & Oncology

## 2019-01-17 ENCOUNTER — Other Ambulatory Visit: Payer: Self-pay

## 2019-01-17 VITALS — BP 143/70 | HR 74 | Temp 97.5°F | Resp 18 | Wt 230.0 lb

## 2019-01-17 DIAGNOSIS — C7B02 Secondary carcinoid tumors of liver: Secondary | ICD-10-CM | POA: Insufficient documentation

## 2019-01-17 DIAGNOSIS — C7B8 Other secondary neuroendocrine tumors: Secondary | ICD-10-CM

## 2019-01-17 DIAGNOSIS — C7A00 Malignant carcinoid tumor of unspecified site: Secondary | ICD-10-CM | POA: Diagnosis not present

## 2019-01-17 LAB — CBC WITH DIFFERENTIAL (CANCER CENTER ONLY)
Abs Immature Granulocytes: 0.05 10*3/uL (ref 0.00–0.07)
Basophils Absolute: 0 10*3/uL (ref 0.0–0.1)
Basophils Relative: 0 %
Eosinophils Absolute: 0.2 10*3/uL (ref 0.0–0.5)
Eosinophils Relative: 3 %
HCT: 38.9 % (ref 36.0–46.0)
Hemoglobin: 12.5 g/dL (ref 12.0–15.0)
Immature Granulocytes: 1 %
Lymphocytes Relative: 33 %
Lymphs Abs: 3.1 10*3/uL (ref 0.7–4.0)
MCH: 28.7 pg (ref 26.0–34.0)
MCHC: 32.1 g/dL (ref 30.0–36.0)
MCV: 89.4 fL (ref 80.0–100.0)
Monocytes Absolute: 0.6 10*3/uL (ref 0.1–1.0)
Monocytes Relative: 7 %
Neutro Abs: 5.3 10*3/uL (ref 1.7–7.7)
Neutrophils Relative %: 56 %
Platelet Count: 238 10*3/uL (ref 150–400)
RBC: 4.35 MIL/uL (ref 3.87–5.11)
RDW: 14.4 % (ref 11.5–15.5)
WBC Count: 9.3 10*3/uL (ref 4.0–10.5)
nRBC: 0 % (ref 0.0–0.2)

## 2019-01-17 LAB — CMP (CANCER CENTER ONLY)
ALT: 18 U/L (ref 0–44)
AST: 16 U/L (ref 15–41)
Albumin: 4 g/dL (ref 3.5–5.0)
Alkaline Phosphatase: 93 U/L (ref 38–126)
Anion gap: 8 (ref 5–15)
BUN: 22 mg/dL — ABNORMAL HIGH (ref 6–20)
CO2: 28 mmol/L (ref 22–32)
Calcium: 9 mg/dL (ref 8.9–10.3)
Chloride: 103 mmol/L (ref 98–111)
Creatinine: 0.7 mg/dL (ref 0.44–1.00)
GFR, Est AFR Am: >60 mL/min (ref >60)
GFR, Estimated: >60 mL/min (ref >60)
Glucose, Bld: 157 mg/dL — ABNORMAL HIGH (ref 70–99)
Potassium: 4.4 mmol/L (ref 3.5–5.1)
Sodium: 139 mmol/L (ref 135–145)
Total Bilirubin: 0.4 mg/dL (ref 0.3–1.2)
Total Protein: 7.2 g/dL (ref 6.5–8.1)

## 2019-01-17 MED ORDER — LANREOTIDE ACETATE 120 MG/0.5ML ~~LOC~~ SOLN
SUBCUTANEOUS | Status: AC
  Filled 2019-01-17: qty 120

## 2019-01-17 MED ORDER — LANREOTIDE ACETATE 120 MG/0.5ML ~~LOC~~ SOLN
120.0000 mg | Freq: Once | SUBCUTANEOUS | Status: AC
  Administered 2019-01-17: 120 mg via SUBCUTANEOUS

## 2019-01-17 NOTE — Patient Instructions (Signed)
Lanreotide injection (Somatuline) What is this medicine? LANREOTIDE (lan REE oh tide) is used to reduce blood levels of growth hormone in patients with a condition called acromegaly. It also works to slow or stop tumor growth in patients with neuroendocrine tumors and treat carcinoid syndrome. This medicine may be used for other purposes; ask your health care provider or pharmacist if you have questions. COMMON BRAND NAME(S): Somatuline [place] What should I tell my health care provider before I take this medicine? They need to know if you have any of these conditions:  diabetes  gallbladder disease  heart disease  kidney disease  liver disease  thyroid disease  an unusual or allergic reaction to lanreotide, other medicines, foods, dyes, or preservatives  pregnant or trying to get pregnant  breast-feeding How should I use this medicine? This medicine is for injection under the skin. It is given by a health care professional in a hospital or clinic setting. Contact your pediatrician or health care professional regarding the use of this medicine in children. Special care may be needed. Overdosage: If you think you have taken too much of this medicine contact a poison control center or emergency room at once. NOTE: This medicine is only for you. Do not share this medicine with others. What if I miss a dose? It is important not to miss your dose. Call your doctor or health care professional if you are unable to keep an appointment. What may interact with this medicine? This medicine may interact with the following medications:  bromocriptine  cyclosporine  certain medicines for blood pressure, heart disease, irregular heart beat  certain medicines for diabetes  quinidine  terfenadine This list may not describe all possible interactions. Give your health care provider a list of all the medicines, herbs, non-prescription drugs, or dietary supplements you use. Also tell them if  you smoke, drink alcohol, or use illegal drugs. Some items may interact with your medicine. What should I watch for while using this medicine? Tell your doctor or healthcare professional if your symptoms do not start to get better or if they get worse. Visit your doctor or health care professional for regular checks on your progress. Your condition will be monitored carefully while you are receiving this medicine. This medicine may increase blood sugar. Ask your healthcare provider if changes in diet or medicines are needed if you have diabetes. You may need blood work done while you are taking this medicine. Women should inform their doctor if they wish to become pregnant or think they might be pregnant. There is a potential for serious side effects to an unborn child. Talk to your health care professional or pharmacist for more information. Do not breast-feed an infant while taking this medicine or for 6 months after stopping it. This medicine has caused ovarian failure in some women. This medicine may interfere with the ability to have a child. Talk with your doctor or health care professional if you are concerned about your fertility. What side effects may I notice from receiving this medicine? Side effects that you should report to your doctor or health care professional as soon as possible:  allergic reactions like skin rash, itching or hives, swelling of the face, lips, or tongue  increased blood pressure  severe stomach pain  signs and symptoms of hgh blood sugar such as being more thirsty or hungry or having to urinate more than normal. You may also feel very tired or have blurry vision.  signs and symptoms of low  blood sugar such as feeling anxious; confusion; dizziness; increased hunger; unusually weak or tired; sweating; shakiness; cold; irritable; headache; blurred vision; fast heartbeat; loss of consciousness  unusually slow heartbeat Side effects that usually do not require  medical attention (report to your doctor or health care professional if they continue or are bothersome):  constipation  diarrhea  dizziness  headache  muscle pain  muscle spasms  nausea  pain, redness, or irritation at site where injected This list may not describe all possible side effects. Call your doctor for medical advice about side effects. You may report side effects to FDA at 1-800-FDA-1088. Where should I keep my medicine? This drug is given in a hospital or clinic and will not be stored at home. NOTE: This sheet is a summary. It may not cover all possible information. If you have questions about this medicine, talk to your doctor, pharmacist, or health care provider.  2020 Elsevier/Gold Standard (2017-11-05 09:13:08)  

## 2019-01-17 NOTE — Progress Notes (Signed)
Hematology and Oncology Follow Up Visit  Leah [name] [id number] Oct 29, 1960 58 y.o. 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  Thankfully, the Somatuline injection is working quite nicely.  She had an MRI of the liver on 12/21/2018 at [facility].  The 2 liver lesions that have been seen previously one was smaller measuring 1.2 cm and the other is now gone.  I am just happy that everything is doing well with respect to her neuroendocrine carcinoma.  She is feeling pretty well.  She really has had no complaints.  She is trying to lose a little bit of weight.  She is seeing a hepatologist for her hepatic steatosis.  Hopefully, there will be any issues with the fatty liver.  Her liver tests certainly look good.  I do not see anything that would suggest any hepatic dysfunction.  She did have a nice Thanksgiving.  It sounds like she will have a nice Christmas.  Her last Chromogranin A level was down to 85.  She has had no problems with diarrhea.  There is no nausea.  She has had no flushing.  Overall, I would have to say her performance status is ECOG 0.    Medications:  Allergies as of 01/17/2019   No Known Allergies     Medication List       Accurate as of January 17, 2019  1:25 PM. If you have any questions, ask your nurse or doctor.        Acyclovir-Hydrocortisone 5-1 % Crea Commonly known as: Xerese APPLY TO EFFECTED AREA   amLODipine 10 MG tablet Commonly known as: NORVASC Take 1 tablet (10 mg total) by mouth daily.   aspirin 81 MG EC tablet Take 1 tablet (81 mg total) by mouth daily. Swallow whole.   fexofenadine 180 MG tablet Commonly known as: ALLEGRA Take 1 tablet (180 mg total) by mouth daily.   fluticasone 50 MCG/ACT  nasal spray Commonly known as: FLONASE USE 2 SPRAYS IN BOTH NOSTRILS EVERY DAY What changed: See the new instructions.   gabapentin 100 MG capsule Commonly known as: NEURONTIN Take 1 capsule (100 mg total) by mouth at bedtime.   ipratropium 0.06 % nasal spray Commonly known as: ATROVENT USE 2 SPRAYS IN EACH NOSTRIL 4 TIMES A DAY   methocarbamol 500 MG tablet Commonly known as: ROBAXIN Take by mouth.   metoprolol succinate 100 MG 24 hr tablet Commonly known as: TOPROL-XL TAKE 1 TABLET BY MOUTH EVERY EVENING WITH OR IMMEDIATELY FOLLOWING A MEAL   montelukast 10 MG tablet Commonly known as: SINGULAIR TAKE 1 TABLET BY MOUTH EVERYDAY AT BEDTIME   naproxen 500 MG tablet Commonly known as: NAPROSYN TAKE 1 TABLET TWICE A DAY WITH A MEAL   octreotide 30 MG injection Commonly known as: SANDOSTATIN LAR Inject 30 mg into the muscle every 28 (twenty-eight) days.   ondansetron 4 MG disintegrating tablet Commonly known as: ZOFRAN-ODT DISSOLVE 1 TABLET BY MOUTH EVERY 8 HOURS AS NEEDED FOR NAUSEA AND VOMITING   Ospemifene 60 MG Tabs Commonly known as: Osphena Take 1 tablet by mouth daily.   pantoprazole 40 MG tablet Commonly known as: PROTONIX TAKE 1 TABLET BY MOUTH TWICE A DAY   ProAir HFA 108 (90 Base) MCG/ACT inhaler Generic drug: albuterol INHALE 2 PUFFS INTO THE  LUNGS EVERY 6 (SIX) HOURS AS NEEDED. FOR SHORTNESS OF BREATH. What changed: See the new instructions.   Qsymia 7.5-46 MG Cp24 Generic drug: Phentermine-Topiramate Take 1 tablet by mouth daily.   valACYclovir 500 MG tablet Commonly known as: VALTREX TAKE 1 TABLET BY MOUTH TWICE A DAY AS NEEDED FOR COLD SORES   valsartan 320 MG tablet Commonly known as: DIOVAN TAKE 1 TABLET BY MOUTH EVERY DAY   venlafaxine XR 150 MG 24 hr capsule Commonly known as: EFFEXOR-XR TAKE 1 CAPSULE (150 MG TOTAL) BY MOUTH EACH MORNING       Allergies: No Known Allergies  Past Medical History, Surgical history, Social history,  and Family History were reviewed and updated.  Review of Systems: Review of Systems  Constitutional: Negative.   HENT: Negative.   Eyes: Negative.   Respiratory: Negative.   Cardiovascular: Negative.   Gastrointestinal: Negative.   Genitourinary: Negative.   Musculoskeletal: Negative.   Skin: Negative.   Neurological: Negative.   Endo/Heme/Allergies: Negative.   Psychiatric/Behavioral: Negative.      Physical Exam:  weight is 230 lb (104.3 kg). Her temporal temperature is 97.5 F (36.4 C) (abnormal). Her blood pressure is 143/70 (abnormal) and her pulse is 74. Her respiration is 18 and oxygen saturation is 99%.   Wt Readings from Last 3 Encounters:  01/17/19 230 lb (104.3 kg)  12/23/18 223 lb (101.2 kg)  12/20/18 228 lb 0.6 oz (103.4 kg)    Physical Exam Vitals signs reviewed.  HENT:     Head: Normocephalic and atraumatic.  Eyes:     Pupils: Pupils are equal, round, and reactive to light.  Neck:     Musculoskeletal: Normal range of motion.  Cardiovascular:     Rate and Rhythm: Normal rate and regular rhythm.     Heart sounds: Normal heart sounds.  Pulmonary:     Effort: Pulmonary effort is normal.     Breath sounds: Normal breath sounds.  Abdominal:     General: Bowel sounds are normal.     Palpations: Abdomen is soft.     Comments: Abdominal exam shows an obese abdomen that is soft.  She has good bowel sounds.  There is no fluid wave.  She has laparotomy scars that are well-healed.  There is no tenderness.  There is no guarding or rebound.  There is no palpable liver or spleen tip.  Musculoskeletal: Normal range of motion.        General: No tenderness or deformity.  Lymphadenopathy:     Cervical: No cervical adenopathy.  Skin:    General: Skin is warm and dry.     Findings: No erythema or rash.  Neurological:     Mental Status: She is alert and oriented to person, place, and time.  Psychiatric:        Behavior: Behavior normal.        Thought Content:  Thought content normal.        Judgment: Judgment normal.      Lab Results  Component Value Date   WBC 9.3 01/17/2019   HGB 12.5 01/17/2019   HCT 38.9 01/17/2019   MCV 89.4 01/17/2019   PLT 238 01/17/2019   Lab Results  Component Value Date   FERRITIN 382 (H) 10/18/2008   IRON <10 (L) 10/18/2008   TIBC NOT CALC Not calculated due to Iron <10. 10/18/2008   UIBC 156 10/18/2008   IRONPCTSAT NOT CALC Not calculated due to Iron <10. 10/18/2008   Lab Results  Component Value  Date   RETICCTPCT 0.9 10/18/2008   RBC 4.35 01/17/2019   No results found for: KPAFRELGTCHN, LAMBDASER, KAPLAMBRATIO No results found for: Kandis Cocking, IGMSERUM No results found for: Odetta Pink, SPEI   Chemistry      Component Value Date/Time   NA 139 01/17/2019 1210   NA 140 01/20/2017 1416   NA 141 01/17/2016 1047   K 4.4 01/17/2019 1210   K 3.9 01/20/2017 1416   K 3.2 (L) 01/17/2016 1047   CL 103 01/17/2019 1210   CL 102 01/20/2017 1416   CO2 28 01/17/2019 1210   CO2 26 01/20/2017 1416   CO2 27 01/17/2016 1047   BUN 22 (H) 01/17/2019 1210   BUN 15 01/20/2017 1416   BUN 6.6 (L) 01/17/2016 1047   CREATININE 0.70 01/17/2019 1210   CREATININE 1.1 01/20/2017 1416   CREATININE 0.7 01/17/2016 1047      Component Value Date/Time   CALCIUM 9.0 01/17/2019 1210   CALCIUM 9.1 01/20/2017 1416   CALCIUM 8.7 01/17/2016 1047   ALKPHOS 93 01/17/2019 1210   ALKPHOS 84 01/20/2017 1416   ALKPHOS 90 01/17/2016 1047   AST 16 01/17/2019 1210   AST 34 01/17/2016 1047   ALT 18 01/17/2019 1210   ALT 35 01/20/2017 1416   ALT 38 01/17/2016 1047   BILITOT 0.4 01/17/2019 1210   BILITOT 0.42 01/17/2016 1047       Impression and Plan: Ms. Malen is a very pleasant 58 yo caucasian female withlong standing history ofmetastaticcarcinoidwith recurrent liver metastasis.  It is apparent that the Somatuline is working quite nicely.  I am just happy that  the MRI of the liver showed a decrease in her liver disease.  I think that the hepatic steatosis will be long-lasting and hopefully will not become a problem for her.  I will plan to have her come back monthly for her Somatuline injection.  We will see her back in 2 months.    [name], MD 12/7/20201:25 PM

## 2019-01-19 LAB — CHROMOGRANIN A: Chromogranin A (ng/mL): 107.2 ng/mL — ABNORMAL HIGH (ref 0.0–101.8)

## 2019-01-27 NOTE — Telephone Encounter (Signed)
Last OV 12/23/18 Last refill - not on current med list Next OV 02/02/19

## 2019-02-02 ENCOUNTER — Ambulatory Visit: Payer: 59 | Admitting: Family Medicine

## 2019-02-02 ENCOUNTER — Encounter: Payer: Self-pay | Admitting: Family Medicine

## 2019-02-14 ENCOUNTER — Inpatient Hospital Stay: Payer: Managed Care, Other (non HMO) | Attending: Hematology & Oncology

## 2019-02-14 ENCOUNTER — Other Ambulatory Visit: Payer: Self-pay | Admitting: Family Medicine

## 2019-02-14 ENCOUNTER — Other Ambulatory Visit: Payer: Self-pay

## 2019-02-14 VITALS — BP 160/90 | HR 115 | Temp 97.1°F

## 2019-02-14 DIAGNOSIS — C7B02 Secondary carcinoid tumors of liver: Secondary | ICD-10-CM | POA: Diagnosis present

## 2019-02-14 DIAGNOSIS — C7B8 Other secondary neuroendocrine tumors: Secondary | ICD-10-CM

## 2019-02-14 DIAGNOSIS — C7A00 Malignant carcinoid tumor of unspecified site: Secondary | ICD-10-CM | POA: Insufficient documentation

## 2019-02-14 MED ORDER — LANREOTIDE ACETATE 120 MG/0.5ML ~~LOC~~ SOLN
SUBCUTANEOUS | Status: AC
  Filled 2019-02-14: qty 120

## 2019-02-14 MED ORDER — LANREOTIDE ACETATE 120 MG/0.5ML ~~LOC~~ SOLN
120.0000 mg | Freq: Once | SUBCUTANEOUS | Status: AC
  Administered 2019-02-14: 120 mg via SUBCUTANEOUS

## 2019-02-14 NOTE — Patient Instructions (Signed)
Lanreotide injection What is this medicine? LANREOTIDE (lan REE oh tide) is used to reduce blood levels of growth hormone in patients with a condition called acromegaly. It also works to slow or stop tumor growth in patients with neuroendocrine tumors and treat carcinoid syndrome. This medicine may be used for other purposes; ask your health care provider or pharmacist if you have questions. COMMON BRAND NAME(S): Somatuline [place] What should I tell my health care provider before I take this medicine? They need to know if you have any of these conditions:  diabetes  gallbladder disease  heart disease  kidney disease  liver disease  thyroid disease  an unusual or allergic reaction to lanreotide, other medicines, foods, dyes, or preservatives  pregnant or trying to get pregnant  breast-feeding How should I use this medicine? This medicine is for injection under the skin. It is given by a health care professional in a hospital or clinic setting. Contact your pediatrician or health care professional regarding the use of this medicine in children. Special care may be needed. Overdosage: If you think you have taken too much of this medicine contact a poison control center or emergency room at once. NOTE: This medicine is only for you. Do not share this medicine with others. What if I miss a dose? It is important not to miss your dose. Call your doctor or health care professional if you are unable to keep an appointment. What may interact with this medicine? This medicine may interact with the following medications:  bromocriptine  cyclosporine  certain medicines for blood pressure, heart disease, irregular heart beat  certain medicines for diabetes  quinidine  terfenadine This list may not describe all possible interactions. Give your health care provider a list of all the medicines, herbs, non-prescription drugs, or dietary supplements you use. Also tell them if you smoke,  drink alcohol, or use illegal drugs. Some items may interact with your medicine. What should I watch for while using this medicine? Tell your doctor or healthcare professional if your symptoms do not start to get better or if they get worse. Visit your doctor or health care professional for regular checks on your progress. Your condition will be monitored carefully while you are receiving this medicine. This medicine may increase blood sugar. Ask your healthcare provider if changes in diet or medicines are needed if you have diabetes. You may need blood work done while you are taking this medicine. Women should inform their doctor if they wish to become pregnant or think they might be pregnant. There is a potential for serious side effects to an unborn child. Talk to your health care professional or pharmacist for more information. Do not breast-feed an infant while taking this medicine or for 6 months after stopping it. This medicine has caused ovarian failure in some women. This medicine may interfere with the ability to have a child. Talk with your doctor or health care professional if you are concerned about your fertility. What side effects may I notice from receiving this medicine? Side effects that you should report to your doctor or health care professional as soon as possible:  allergic reactions like skin rash, itching or hives, swelling of the face, lips, or tongue  increased blood pressure  severe stomach pain  signs and symptoms of hgh blood sugar such as being more thirsty or hungry or having to urinate more than normal. You may also feel very tired or have blurry vision.  signs and symptoms of low blood   sugar such as feeling anxious; confusion; dizziness; increased hunger; unusually weak or tired; sweating; shakiness; cold; irritable; headache; blurred vision; fast heartbeat; loss of consciousness  unusually slow heartbeat Side effects that usually do not require medical  attention (report to your doctor or health care professional if they continue or are bothersome):  constipation  diarrhea  dizziness  headache  muscle pain  muscle spasms  nausea  pain, redness, or irritation at site where injected This list may not describe all possible side effects. Call your doctor for medical advice about side effects. You may report side effects to FDA at 1-800-FDA-1088. Where should I keep my medicine? This drug is given in a hospital or clinic and will not be stored at home. NOTE: This sheet is a summary. It may not cover all possible information. If you have questions about this medicine, talk to your doctor, pharmacist, or health care provider.  2020 [employer]vier/Gold Standard (2017-11-05 09:13:08)  

## 2019-03-14 ENCOUNTER — Inpatient Hospital Stay: Payer: 59

## 2019-03-14 ENCOUNTER — Encounter: Payer: Self-pay | Admitting: Hematology & Oncology

## 2019-03-14 ENCOUNTER — Other Ambulatory Visit: Payer: Self-pay

## 2019-03-14 ENCOUNTER — Inpatient Hospital Stay: Payer: 59 | Attending: Hematology & Oncology | Admitting: Hematology & Oncology

## 2019-03-14 VITALS — BP 158/94 | HR 82 | Temp 97.7°F | Resp 17 | Wt 235.0 lb

## 2019-03-14 DIAGNOSIS — C7B8 Other secondary neuroendocrine tumors: Secondary | ICD-10-CM

## 2019-03-14 DIAGNOSIS — C7B02 Secondary carcinoid tumors of liver: Secondary | ICD-10-CM | POA: Insufficient documentation

## 2019-03-14 DIAGNOSIS — C7A00 Malignant carcinoid tumor of unspecified site: Secondary | ICD-10-CM | POA: Diagnosis present

## 2019-03-14 LAB — CMP (CANCER CENTER ONLY)
ALT: 20 U/L (ref 0–44)
AST: 19 U/L (ref 15–41)
Albumin: 3.8 g/dL (ref 3.5–5.0)
Alkaline Phosphatase: 95 U/L (ref 38–126)
Anion gap: 11 (ref 5–15)
BUN: 14 mg/dL (ref 6–20)
CO2: 24 mmol/L (ref 22–32)
Calcium: 9 mg/dL (ref 8.9–10.3)
Chloride: 102 mmol/L (ref 98–111)
Creatinine: 0.72 mg/dL (ref 0.44–1.00)
GFR, Est AFR Am: >60 mL/min (ref >60)
GFR, Estimated: >60 mL/min (ref >60)
Glucose, Bld: 195 mg/dL — ABNORMAL HIGH (ref 70–99)
Potassium: 4.2 mmol/L (ref 3.5–5.1)
Sodium: 137 mmol/L (ref 135–145)
Total Bilirubin: 0.5 mg/dL (ref 0.3–1.2)
Total Protein: 7 g/dL (ref 6.5–8.1)

## 2019-03-14 LAB — CBC WITH DIFFERENTIAL (CANCER CENTER ONLY)
Abs Immature Granulocytes: 0.04 10*3/uL (ref 0.00–0.07)
Basophils Absolute: 0.1 10*3/uL (ref 0.0–0.1)
Basophils Relative: 1 %
Eosinophils Absolute: 0.2 10*3/uL (ref 0.0–0.5)
Eosinophils Relative: 2 %
HCT: 38.4 % (ref 36.0–46.0)
Hemoglobin: 12.4 g/dL (ref 12.0–15.0)
Immature Granulocytes: 0 %
Lymphocytes Relative: 36 %
Lymphs Abs: 3.4 10*3/uL (ref 0.7–4.0)
MCH: 29.7 pg (ref 26.0–34.0)
MCHC: 32.3 g/dL (ref 30.0–36.0)
MCV: 91.9 fL (ref 80.0–100.0)
Monocytes Absolute: 0.7 10*3/uL (ref 0.1–1.0)
Monocytes Relative: 7 %
Neutro Abs: 5.2 10*3/uL (ref 1.7–7.7)
Neutrophils Relative %: 54 %
Platelet Count: 262 10*3/uL (ref 150–400)
RBC: 4.18 MIL/uL (ref 3.87–5.11)
RDW: 14.3 % (ref 11.5–15.5)
WBC Count: 9.5 10*3/uL (ref 4.0–10.5)
nRBC: 0 % (ref 0.0–0.2)

## 2019-03-14 MED ORDER — LANREOTIDE ACETATE 120 MG/0.5ML ~~LOC~~ SOLN
SUBCUTANEOUS | Status: AC
  Filled 2019-03-14: qty 120

## 2019-03-14 MED ORDER — LANREOTIDE ACETATE 120 MG/0.5ML ~~LOC~~ SOLN
120.0000 mg | Freq: Once | SUBCUTANEOUS | Status: AC
  Administered 2019-03-14: 120 mg via SUBCUTANEOUS

## 2019-03-14 MED ORDER — VALACYCLOVIR HCL 500 MG PO TABS: ORAL_TABLET | ORAL | 1 refills | Status: DC

## 2019-03-14 NOTE — Patient Instructions (Signed)
Lanreotide injection What is this medicine? LANREOTIDE (lan REE oh tide) is used to reduce blood levels of growth hormone in patients with a condition called acromegaly. It also works to slow or stop tumor growth in patients with neuroendocrine tumors and treat carcinoid syndrome. This medicine may be used for other purposes; ask your health care provider or pharmacist if you have questions. COMMON BRAND NAME(S): Somatuline [place] What should I tell my health care provider before I take this medicine? They need to know if you have any of these conditions:  diabetes  gallbladder disease  heart disease  kidney disease  liver disease  thyroid disease  an unusual or allergic reaction to lanreotide, other medicines, foods, dyes, or preservatives  pregnant or trying to get pregnant  breast-feeding How should I use this medicine? This medicine is for injection under the skin. It is given by a health care professional in a hospital or clinic setting. Contact your pediatrician or health care professional regarding the use of this medicine in children. Special care may be needed. Overdosage: If you think you have taken too much of this medicine contact a poison control center or emergency room at once. NOTE: This medicine is only for you. Do not share this medicine with others. What if I miss a dose? It is important not to miss your dose. Call your doctor or health care professional if you are unable to keep an appointment. What may interact with this medicine? This medicine may interact with the following medications:  bromocriptine  cyclosporine  certain medicines for blood pressure, heart disease, irregular heart beat  certain medicines for diabetes  quinidine  terfenadine This list may not describe all possible interactions. Give your health care provider a list of all the medicines, herbs, non-prescription drugs, or dietary supplements you use. Also tell them if you smoke,  drink alcohol, or use illegal drugs. Some items may interact with your medicine. What should I watch for while using this medicine? Tell your doctor or healthcare professional if your symptoms do not start to get better or if they get worse. Visit your doctor or health care professional for regular checks on your progress. Your condition will be monitored carefully while you are receiving this medicine. This medicine may increase blood sugar. Ask your healthcare provider if changes in diet or medicines are needed if you have diabetes. You may need blood work done while you are taking this medicine. Women should inform their doctor if they wish to become pregnant or think they might be pregnant. There is a potential for serious side effects to an unborn child. Talk to your health care professional or pharmacist for more information. Do not breast-feed an infant while taking this medicine or for 6 months after stopping it. This medicine has caused ovarian failure in some women. This medicine may interfere with the ability to have a child. Talk with your doctor or health care professional if you are concerned about your fertility. What side effects may I notice from receiving this medicine? Side effects that you should report to your doctor or health care professional as soon as possible:  allergic reactions like skin rash, itching or hives, swelling of the face, lips, or tongue  increased blood pressure  severe stomach pain  signs and symptoms of hgh blood sugar such as being more thirsty or hungry or having to urinate more than normal. You may also feel very tired or have blurry vision.  signs and symptoms of low blood   sugar such as feeling anxious; confusion; dizziness; increased hunger; unusually weak or tired; sweating; shakiness; cold; irritable; headache; blurred vision; fast heartbeat; loss of consciousness  unusually slow heartbeat Side effects that usually do not require medical  attention (report to your doctor or health care professional if they continue or are bothersome):  constipation  diarrhea  dizziness  headache  muscle pain  muscle spasms  nausea  pain, redness, or irritation at site where injected This list may not describe all possible side effects. Call your doctor for medical advice about side effects. You may report side effects to FDA at 1-800-FDA-1088. Where should I keep my medicine? This drug is given in a hospital or clinic and will not be stored at home. NOTE: This sheet is a summary. It may not cover all possible information. If you have questions about this medicine, talk to your doctor, pharmacist, or health care provider.  2020 [employer]vier/Gold Standard (2017-11-05 09:13:08)  

## 2019-03-14 NOTE — Progress Notes (Signed)
Hematology and Oncology Follow Up Visit  Leah [name] ZB:[id number] 1960-06-23 59 y.o. 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  Thankfully, the Somatuline injection is working quite nicely.  She had a birthday over the weekend.  She was 59 years old.  Her husband, as always, really do a lot for her.  This really is no surprise.  There is such a good couple.  She has not yet seen a liver specialist for hepatic steatosis.  She does have diabetes.  Her blood sugar today is 195.  I told her this probably needs to get a little bit better if she is really going to prevent liver damage from blood sugars.  I think she goes back to see the surgeons at [facility] in another 3 months.  She has had no problems with diarrhea.  There is been no flushing.  She has had no chest wall pain.  Is been no wheezing.  She had no shortness of breath.    Her last chromogranin A was 107.    Overall, I would have to say her performance status is ECOG 0.    Medications:  Allergies as of 03/14/2019   No Known Allergies     Medication List       Accurate as of March 14, 2019 12:30 PM. If you have any questions, ask your nurse or doctor.        Acyclovir-Hydrocortisone 5-1 % Crea Commonly known as: Xerese APPLY TO EFFECTED AREA   amLODipine 10 MG tablet Commonly known as: NORVASC Take 1 tablet (10 mg total) by mouth daily.   aspirin 81 MG EC tablet Take 1 tablet (81 mg total) by mouth daily. Swallow whole.   clonazePAM 0.5 MG tablet Commonly known as: KLONOPIN TAKE 1 TABLET BY MOUTH TWICE A DAY AS NEEDED FOR ANXIETY   fexofenadine 180 MG tablet Commonly known as: ALLEGRA Take 1 tablet (180 mg total) by mouth daily.   fluticasone 50 MCG/ACT nasal spray  Commonly known as: FLONASE USE 2 SPRAYS IN BOTH NOSTRILS EVERY DAY What changed: See the new instructions.   gabapentin 100 MG capsule Commonly known as: NEURONTIN Take 1 capsule (100 mg total) by mouth at bedtime.   ipratropium 0.06 % nasal spray Commonly known as: ATROVENT USE 2 SPRAYS IN EACH NOSTRIL 4 TIMES A DAY   methocarbamol 500 MG tablet Commonly known as: ROBAXIN Take by mouth.   metoprolol succinate 100 MG 24 hr tablet Commonly known as: TOPROL-XL TAKE 1 TABLET BY MOUTH EVERY EVENING WITH OR IMMEDIATELY FOLLOWING A MEAL   montelukast 10 MG tablet Commonly known as: SINGULAIR TAKE 1 TABLET BY MOUTH EVERYDAY AT BEDTIME   mupirocin ointment 2 % Commonly known as: BACTROBAN mupirocin 2 % topical ointment   naproxen 500 MG tablet Commonly known as: NAPROSYN TAKE 1 TABLET TWICE A DAY WITH A MEAL   octreotide 30 MG injection Commonly known as: SANDOSTATIN LAR Inject 30 mg into the muscle every 28 (twenty-eight) days.   ondansetron 4 MG disintegrating tablet Commonly known as: ZOFRAN-ODT DISSOLVE 1 TABLET BY MOUTH EVERY 8 HOURS AS NEEDED FOR NAUSEA AND VOMITING   Ospemifene 60 MG Tabs Commonly known as: Osphena Take 1 tablet by mouth daily.   pantoprazole 40 MG tablet Commonly known as: PROTONIX TAKE  1 TABLET BY MOUTH TWICE A DAY   ProAir HFA 108 (90 Base) MCG/ACT inhaler Generic drug: albuterol INHALE 2 PUFFS INTO THE LUNGS EVERY 6 (SIX) HOURS AS NEEDED. FOR SHORTNESS OF BREATH. What changed: See the new instructions.   Qsymia 7.5-46 MG Cp24 Generic drug: Phentermine-Topiramate Take 1 tablet by mouth daily.   valACYclovir 500 MG tablet Commonly known as: VALTREX TAKE 1 TABLET BY MOUTH TWICE A DAY AS NEEDED FOR COLD SORES   valsartan 320 MG tablet Commonly known as: DIOVAN TAKE 1 TABLET BY MOUTH EVERY DAY   venlafaxine XR 150 MG 24 hr capsule Commonly known as: EFFEXOR-XR TAKE 1 CAPSULE (150 MG TOTAL) BY MOUTH EACH MORNING       Allergies:  No Known Allergies  Past Medical History, Surgical history, Social history, and Family History were reviewed and updated.  Review of Systems: Review of Systems  Constitutional: Negative.   HENT: Negative.   Eyes: Negative.   Respiratory: Negative.   Cardiovascular: Negative.   Gastrointestinal: Negative.   Genitourinary: Negative.   Musculoskeletal: Negative.   Skin: Negative.   Neurological: Negative.   Endo/Heme/Allergies: Negative.   Psychiatric/Behavioral: Negative.      Physical Exam:  weight is 235 lb (106.6 kg). Her temporal temperature is 97.7 F (36.5 C). Her blood pressure is 158/94 (abnormal) and her pulse is 82. Her respiration is 17 and oxygen saturation is 98%.   Wt Readings from Last 3 Encounters:  03/14/19 235 lb (106.6 kg)  01/17/19 230 lb (104.3 kg)  12/23/18 223 lb (101.2 kg)    Physical Exam Vitals reviewed.  HENT:     Head: Normocephalic and atraumatic.  Eyes:     Pupils: Pupils are equal, round, and reactive to light.  Cardiovascular:     Rate and Rhythm: Normal rate and regular rhythm.     Heart sounds: Normal heart sounds.  Pulmonary:     Effort: Pulmonary effort is normal.     Breath sounds: Normal breath sounds.  Abdominal:     General: Bowel sounds are normal.     Palpations: Abdomen is soft.     Comments: Abdominal exam shows an obese abdomen that is soft.  She has good bowel sounds.  There is no fluid wave.  She has laparotomy scars that are well-healed.  There is no tenderness.  There is no guarding or rebound.  There is no palpable liver or spleen tip.  Musculoskeletal:        General: No tenderness or deformity. Normal range of motion.     Cervical back: Normal range of motion.  Lymphadenopathy:     Cervical: No cervical adenopathy.  Skin:    General: Skin is warm and dry.     Findings: No erythema or rash.  Neurological:     Mental Status: She is alert and oriented to person, place, and time.  Psychiatric:        Behavior:  Behavior normal.        Thought Content: Thought content normal.        Judgment: Judgment normal.      Lab Results  Component Value Date   WBC 9.5 03/14/2019   HGB 12.4 03/14/2019   HCT 38.4 03/14/2019   MCV 91.9 03/14/2019   PLT 262 03/14/2019   Lab Results  Component Value Date   FERRITIN 382 (H) 10/18/2008   IRON <10 (L) 10/18/2008   TIBC NOT CALC Not calculated due to Iron <10. 10/18/2008   UIBC 156 10/18/2008  IRONPCTSAT NOT CALC Not calculated due to Iron <10. 10/18/2008   Lab Results  Component Value Date   RETICCTPCT 0.9 10/18/2008   RBC 4.18 03/14/2019   No results found for: KPAFRELGTCHN, LAMBDASER, KAPLAMBRATIO No results found for: Kandis Cocking, IGMSERUM No results found for: Odetta Pink, SPEI   Chemistry      Component Value Date/Time   NA 139 01/17/2019 1210   NA 140 01/20/2017 1416   NA 141 01/17/2016 1047   K 4.4 01/17/2019 1210   K 3.9 01/20/2017 1416   K 3.2 (L) 01/17/2016 1047   CL 103 01/17/2019 1210   CL 102 01/20/2017 1416   CO2 28 01/17/2019 1210   CO2 26 01/20/2017 1416   CO2 27 01/17/2016 1047   BUN 22 (H) 01/17/2019 1210   BUN 15 01/20/2017 1416   BUN 6.6 (L) 01/17/2016 1047   CREATININE 0.70 01/17/2019 1210   CREATININE 1.1 01/20/2017 1416   CREATININE 0.7 01/17/2016 1047      Component Value Date/Time   CALCIUM 9.0 01/17/2019 1210   CALCIUM 9.1 01/20/2017 1416   CALCIUM 8.7 01/17/2016 1047   ALKPHOS 93 01/17/2019 1210   ALKPHOS 84 01/20/2017 1416   ALKPHOS 90 01/17/2016 1047   AST 16 01/17/2019 1210   AST 34 01/17/2016 1047   ALT 18 01/17/2019 1210   ALT 35 01/20/2017 1416   ALT 38 01/17/2016 1047   BILITOT 0.4 01/17/2019 1210   BILITOT 0.42 01/17/2016 1047       Impression and Plan: Ms. Zevenbergen is a very pleasant 59 yo caucasian female withlong standing history ofmetastaticcarcinoidwith recurrent liver metastasis.  We will have to see what the chromogranin  A level is.  Hopefully it is no higher.  If it is, we might have to think about increasing the frequency of the Somatuline.  She comes in monthly for Somatuline.  We will see her back in 1 month.    [name], MD 2/1/202112:30 PM

## 2019-03-15 LAB — CHROMOGRANIN A: Chromogranin A (ng/mL): 99.7 ng/mL (ref 0.0–101.8)

## 2019-03-22 ENCOUNTER — Other Ambulatory Visit: Payer: Self-pay

## 2019-03-22 DIAGNOSIS — F419 Anxiety disorder, unspecified: Secondary | ICD-10-CM

## 2019-03-22 MED ORDER — VENLAFAXINE HCL ER 150 MG PO CP24: ORAL_CAPSULE | ORAL | 1 refills | Status: DC

## 2019-03-28 ENCOUNTER — Other Ambulatory Visit: Payer: Self-pay | Admitting: Family Medicine

## 2019-03-28 DIAGNOSIS — C7B8 Other secondary neuroendocrine tumors: Secondary | ICD-10-CM

## 2019-03-28 NOTE — Progress Notes (Signed)
Please call her and see if any other lab besides chromogranin A level. That is all I see in his note.  I have this in for her.  Orma Flaming, MD [place]

## 2019-03-28 NOTE — Progress Notes (Signed)
LVM--to call the office back. 

## 2019-03-29 ENCOUNTER — Other Ambulatory Visit: Payer: Self-pay

## 2019-03-29 MED ORDER — VALSARTAN 320 MG PO TABS: 320.0000 mg | ORAL_TABLET | Freq: Every day | ORAL | 0 refills | Status: DC

## 2019-03-29 NOTE — Progress Notes (Signed)
Refill sent to Pharmacy. ?

## 2019-04-01 ENCOUNTER — Other Ambulatory Visit: Payer: Managed Care, Other (non HMO)

## 2019-04-11 ENCOUNTER — Inpatient Hospital Stay: Payer: 59

## 2019-04-11 ENCOUNTER — Encounter: Payer: Self-pay | Admitting: Hematology & Oncology

## 2019-04-11 ENCOUNTER — Other Ambulatory Visit: Payer: Self-pay

## 2019-04-11 ENCOUNTER — Inpatient Hospital Stay: Payer: 59 | Attending: Hematology & Oncology | Admitting: Family

## 2019-04-11 VITALS — BP 160/76 | HR 80 | Temp 97.3°F | Resp 19 | Wt 231.0 lb

## 2019-04-11 DIAGNOSIS — C7B8 Other secondary neuroendocrine tumors: Secondary | ICD-10-CM

## 2019-04-11 DIAGNOSIS — C7B02 Secondary carcinoid tumors of liver: Secondary | ICD-10-CM | POA: Diagnosis present

## 2019-04-11 DIAGNOSIS — R399 Unspecified symptoms and signs involving the genitourinary system: Secondary | ICD-10-CM

## 2019-04-11 DIAGNOSIS — C7A00 Malignant carcinoid tumor of unspecified site: Secondary | ICD-10-CM | POA: Insufficient documentation

## 2019-04-11 LAB — CBC WITH DIFFERENTIAL (CANCER CENTER ONLY)
Abs Immature Granulocytes: 0.05 10*3/uL (ref 0.00–0.07)
Basophils Absolute: 0.1 10*3/uL (ref 0.0–0.1)
Basophils Relative: 1 %
Eosinophils Absolute: 0.2 10*3/uL (ref 0.0–0.5)
Eosinophils Relative: 2 %
HCT: 41.9 % (ref 36.0–46.0)
Hemoglobin: 13.2 g/dL (ref 12.0–15.0)
Immature Granulocytes: 1 %
Lymphocytes Relative: 31 %
Lymphs Abs: 3.4 10*3/uL (ref 0.7–4.0)
MCH: 29.9 pg (ref 26.0–34.0)
MCHC: 31.5 g/dL (ref 30.0–36.0)
MCV: 94.8 fL (ref 80.0–100.0)
Monocytes Absolute: 0.8 10*3/uL (ref 0.1–1.0)
Monocytes Relative: 7 %
Neutro Abs: 6.4 10*3/uL (ref 1.7–7.7)
Neutrophils Relative %: 58 %
Platelet Count: 294 10*3/uL (ref 150–400)
RBC: 4.42 MIL/uL (ref 3.87–5.11)
RDW: 13.4 % (ref 11.5–15.5)
WBC Count: 10.9 10*3/uL — ABNORMAL HIGH (ref 4.0–10.5)
nRBC: 0 % (ref 0.0–0.2)

## 2019-04-11 LAB — CMP (CANCER CENTER ONLY)
ALT: 24 U/L (ref 0–44)
AST: 19 U/L (ref 15–41)
Albumin: 4.2 g/dL (ref 3.5–5.0)
Alkaline Phosphatase: 84 U/L (ref 38–126)
Anion gap: 12 (ref 5–15)
BUN: 17 mg/dL (ref 6–20)
CO2: 27 mmol/L (ref 22–32)
Calcium: 9.1 mg/dL (ref 8.9–10.3)
Chloride: 102 mmol/L (ref 98–111)
Creatinine: 0.78 mg/dL (ref 0.44–1.00)
GFR, Est AFR Am: >60 mL/min (ref >60)
GFR, Estimated: >60 mL/min (ref >60)
Glucose, Bld: 169 mg/dL — ABNORMAL HIGH (ref 70–99)
Potassium: 4 mmol/L (ref 3.5–5.1)
Sodium: 141 mmol/L (ref 135–145)
Total Bilirubin: 0.5 mg/dL (ref 0.3–1.2)
Total Protein: 7.3 g/dL (ref 6.5–8.1)

## 2019-04-11 LAB — URINALYSIS, COMPLETE (UACMP) WITH MICROSCOPIC
Bilirubin Urine: NEGATIVE
Glucose, UA: NEGATIVE mg/dL
Hgb urine dipstick: NEGATIVE
Ketones, ur: NEGATIVE mg/dL
Leukocytes,Ua: NEGATIVE
Nitrite: NEGATIVE
Protein, ur: NEGATIVE mg/dL
Specific Gravity, Urine: >1.03 — ABNORMAL HIGH (ref 1.005–1.030)
pH: 5.5 (ref 5.0–8.0)

## 2019-04-11 LAB — LACTATE DEHYDROGENASE: LDH: 226 U/L — ABNORMAL HIGH (ref 98–192)

## 2019-04-11 MED ORDER — LANREOTIDE ACETATE 120 MG/0.5ML ~~LOC~~ SOLN
SUBCUTANEOUS | Status: AC
  Filled 2019-04-11: qty 120

## 2019-04-11 MED ORDER — LANREOTIDE ACETATE 120 MG/0.5ML ~~LOC~~ SOLN
120.0000 mg | Freq: Once | SUBCUTANEOUS | Status: AC
  Administered 2019-04-11: 120 mg via SUBCUTANEOUS

## 2019-04-11 NOTE — Patient Instructions (Signed)
Lanreotide injection What is this medicine? LANREOTIDE (lan REE oh tide) is used to reduce blood levels of growth hormone in patients with a condition called acromegaly. It also works to slow or stop tumor growth in patients with neuroendocrine tumors and treat carcinoid syndrome. This medicine may be used for other purposes; ask your health care provider or pharmacist if you have questions. COMMON BRAND NAME(S): Somatuline [place] What should I tell my health care provider before I take this medicine? They need to know if you have any of these conditions:  diabetes  gallbladder disease  heart disease  kidney disease  liver disease  thyroid disease  an unusual or allergic reaction to lanreotide, other medicines, foods, dyes, or preservatives  pregnant or trying to get pregnant  breast-feeding How should I use this medicine? This medicine is for injection under the skin. It is given by a health care professional in a hospital or clinic setting. Contact your pediatrician or health care professional regarding the use of this medicine in children. Special care may be needed. Overdosage: If you think you have taken too much of this medicine contact a poison control center or emergency room at once. NOTE: This medicine is only for you. Do not share this medicine with others. What if I miss a dose? It is important not to miss your dose. Call your doctor or health care professional if you are unable to keep an appointment. What may interact with this medicine? This medicine may interact with the following medications:  bromocriptine  cyclosporine  certain medicines for blood pressure, heart disease, irregular heart beat  certain medicines for diabetes  quinidine  terfenadine This list may not describe all possible interactions. Give your health care provider a list of all the medicines, herbs, non-prescription drugs, or dietary supplements you use. Also tell them if you smoke,  drink alcohol, or use illegal drugs. Some items may interact with your medicine. What should I watch for while using this medicine? Tell your doctor or healthcare professional if your symptoms do not start to get better or if they get worse. Visit your doctor or health care professional for regular checks on your progress. Your condition will be monitored carefully while you are receiving this medicine. This medicine may increase blood sugar. Ask your healthcare provider if changes in diet or medicines are needed if you have diabetes. You may need blood work done while you are taking this medicine. Women should inform their doctor if they wish to become pregnant or think they might be pregnant. There is a potential for serious side effects to an unborn child. Talk to your health care professional or pharmacist for more information. Do not breast-feed an infant while taking this medicine or for 6 months after stopping it. This medicine has caused ovarian failure in some women. This medicine may interfere with the ability to have a child. Talk with your doctor or health care professional if you are concerned about your fertility. What side effects may I notice from receiving this medicine? Side effects that you should report to your doctor or health care professional as soon as possible:  allergic reactions like skin rash, itching or hives, swelling of the face, lips, or tongue  increased blood pressure  severe stomach pain  signs and symptoms of hgh blood sugar such as being more thirsty or hungry or having to urinate more than normal. You may also feel very tired or have blurry vision.  signs and symptoms of low blood   sugar such as feeling anxious; confusion; dizziness; increased hunger; unusually weak or tired; sweating; shakiness; cold; irritable; headache; blurred vision; fast heartbeat; loss of consciousness  unusually slow heartbeat Side effects that usually do not require medical  attention (report to your doctor or health care professional if they continue or are bothersome):  constipation  diarrhea  dizziness  headache  muscle pain  muscle spasms  nausea  pain, redness, or irritation at site where injected This list may not describe all possible side effects. Call your doctor for medical advice about side effects. You may report side effects to FDA at 1-800-FDA-1088. Where should I keep my medicine? This drug is given in a hospital or clinic and will not be stored at home. NOTE: This sheet is a summary. It may not cover all possible information. If you have questions about this medicine, talk to your doctor, pharmacist, or health care provider.  2020 [employer]vier/Gold Standard (2017-11-05 09:13:08)  

## 2019-04-11 NOTE — Progress Notes (Signed)
Hematology and Oncology Follow Up Visit  ASMARA LENG [id number] [birth year] 59 y.o. 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 Brecker is here today for follow-up and injection. She had some nausea last week but has not had today. No vomiting.  She follows up again with [facility] in May and will have a repeat liver MRI at that time.  Chromogranin A last month was 99.7.  No fever, chills, cough, rash, dizziness, SOB, chest pain, palpitations, abdominal pain or changes in bowel or bladder habits.  She feels she may have noted some blood in her urine. We will get a UA and CS today.  No other blood loss noted. No bruising or petechiae.  No swelling, tenderness, numbness or tingling in her extremities at this time.  No falls or syncopal episodes to report.  She has maintained a good appetite and is hydrating properly. Her weight is down 4 lbs since her last visit which she is quite happy about.  Blood glucose if 169 today.   ECOG Performance Status: 1 - Symptomatic but completely ambulatory  Medications:  Allergies as of 04/11/2019   No Known Allergies     Medication List       Accurate as of April 11, 2019 12:53 PM. If you have any questions, ask your nurse or doctor.        Acyclovir-Hydrocortisone 5-1 % Crea Commonly known as: Xerese APPLY TO EFFECTED AREA   amLODipine 10 MG tablet Commonly known as: NORVASC Take 1 tablet (10 mg total) by mouth daily.   aspirin 81 MG EC tablet Take 1 tablet (81 mg total) by mouth daily. Swallow whole.   ASPIRIN 81 PO Take 18 mg by mouth daily.   clonazePAM 0.5 MG tablet Commonly known as: KLONOPIN TAKE 1 TABLET BY MOUTH TWICE A DAY AS NEEDED FOR ANXIETY   fexofenadine 180 MG tablet Commonly known as: ALLEGRA Take 1 tablet (180 mg total) by mouth daily.    fluticasone 50 MCG/ACT nasal spray Commonly known as: FLONASE USE 2 SPRAYS IN BOTH NOSTRILS EVERY DAY What changed: See the new instructions.   gabapentin 100 MG capsule Commonly known as: NEURONTIN Take 1 capsule (100 mg total) by mouth at bedtime.   ipratropium 0.06 % nasal spray Commonly known as: ATROVENT USE 2 SPRAYS IN EACH NOSTRIL 4 TIMES A DAY   methocarbamol 500 MG tablet Commonly known as: ROBAXIN Take by mouth.   metoprolol succinate 100 MG 24 hr tablet Commonly known as: TOPROL-XL TAKE 1 TABLET BY MOUTH EVERY EVENING WITH OR IMMEDIATELY FOLLOWING A MEAL   montelukast 10 MG tablet Commonly known as: SINGULAIR TAKE 1 TABLET BY MOUTH EVERYDAY AT BEDTIME   mupirocin ointment 2 % Commonly known as: BACTROBAN mupirocin 2 % topical ointment   naproxen 500 MG tablet Commonly known as: NAPROSYN TAKE 1 TABLET TWICE A DAY WITH A MEAL   octreotide 30 MG injection Commonly known as: SANDOSTATIN LAR Inject 30 mg into the muscle every 28 (twenty-eight) days.   ondansetron 4 MG disintegrating tablet Commonly known as: ZOFRAN-ODT DISSOLVE 1 TABLET BY MOUTH EVERY 8 HOURS AS NEEDED FOR NAUSEA AND VOMITING   Ospemifene 60 MG Tabs Commonly known as: Osphena Take 1 tablet by mouth daily.   pantoprazole 40 MG tablet Commonly known as: PROTONIX TAKE 1 TABLET BY MOUTH TWICE A DAY  ProAir HFA 108 (90 Base) MCG/ACT inhaler Generic drug: albuterol INHALE 2 PUFFS INTO THE LUNGS EVERY 6 (SIX) HOURS AS NEEDED. FOR SHORTNESS OF BREATH. What changed: See the new instructions.   Qsymia 7.5-46 MG Cp24 Generic drug: Phentermine-Topiramate Take 1 tablet by mouth daily.   valACYclovir 500 MG tablet Commonly known as: VALTREX TAKE 1 TABLET BY MOUTH TWICE A DAY AS NEEDED FOR COLD SORES   valsartan 320 MG tablet Commonly known as: DIOVAN Take 1 tablet (320 mg total) by mouth daily.   venlafaxine XR 150 MG 24 hr capsule Commonly known as: EFFEXOR-XR TAKE 1 CAPSULE (150 MG  TOTAL) BY MOUTH EACH MORNING       Allergies: No Known Allergies  Past Medical History, Surgical history, Social history, and Family History were reviewed and updated.  Review of Systems: All other 10 point review of systems is negative.   Physical Exam:  weight is 231 lb (104.8 kg). Her temporal temperature is 97.3 F (36.3 C) (abnormal). Her blood pressure is 160/76 (abnormal) and her pulse is 80. Her respiration is 19 and oxygen saturation is 98%.   Wt Readings from Last 3 Encounters:  04/11/19 231 lb (104.8 kg)  03/14/19 235 lb (106.6 kg)  01/17/19 230 lb (104.3 kg)    Ocular: Sclerae unicteric, pupils equal, round and reactive to light Ear-nose-throat: Oropharynx clear, dentition fair Lymphatic: No cervical or supraclavicular adenopathy Lungs no rales or rhonchi, good excursion bilaterally Heart regular rate and rhythm, no murmur appreciated Abd soft, nontender, positive bowel sounds, no liver or spleen tip palpated on exam, no fluid wave  MSK no focal spinal tenderness, no joint edema Neuro: non-focal, well-oriented, appropriate affect Breasts:   Lab Results  Component Value Date   WBC 10.9 (H) 04/11/2019   HGB 13.2 04/11/2019   HCT 41.9 04/11/2019   MCV 94.8 04/11/2019   PLT 294 04/11/2019   Lab Results  Component Value Date   FERRITIN 382 (H) 10/18/2008   IRON <10 (L) 10/18/2008   TIBC NOT CALC Not calculated due to Iron <10. 10/18/2008   UIBC 156 10/18/2008   IRONPCTSAT NOT CALC Not calculated due to Iron <10. 10/18/2008   Lab Results  Component Value Date   RETICCTPCT 0.9 10/18/2008   RBC 4.42 04/11/2019   No results found for: KPAFRELGTCHN, LAMBDASER, KAPLAMBRATIO No results found for: Kandis Cocking, IGMSERUM No results found for: Odetta Pink, SPEI   Chemistry      Component Value Date/Time   NA 141 04/11/2019 1205   NA 140 01/20/2017 1416   NA 141 01/17/2016 1047   K 4.0 04/11/2019 1205    K 3.9 01/20/2017 1416   K 3.2 (L) 01/17/2016 1047   CL 102 04/11/2019 1205   CL 102 01/20/2017 1416   CO2 27 04/11/2019 1205   CO2 26 01/20/2017 1416   CO2 27 01/17/2016 1047   BUN 17 04/11/2019 1205   BUN 15 01/20/2017 1416   BUN 6.6 (L) 01/17/2016 1047   CREATININE 0.78 04/11/2019 1205   CREATININE 1.1 01/20/2017 1416   CREATININE 0.7 01/17/2016 1047      Component Value Date/Time   CALCIUM 9.1 04/11/2019 1205   CALCIUM 9.1 01/20/2017 1416   CALCIUM 8.7 01/17/2016 1047   ALKPHOS 84 04/11/2019 1205   ALKPHOS 84 01/20/2017 1416   ALKPHOS 90 01/17/2016 1047   AST 19 04/11/2019 1205   AST 34 01/17/2016 1047   ALT 24 04/11/2019 1205   ALT  35 01/20/2017 1416   ALT 38 01/17/2016 1047   BILITOT 0.5 04/11/2019 1205   BILITOT 0.42 01/17/2016 1047       Impression and Plan: Ms. Saulino is a very pleasant 59 yo caucasian female withlong standing history ofmetastaticcarcinoidwith recurrent liver metastasis. She continues to do well and received her Somatuline injection today as planned.  We will see what her UA and culture show and treat for UTI if needed.  We will plan to see her back in another month.  She will contact our office with any questions or concerns. We can certainly see her sooner if needed.   Laverna Peace, NP 3/1/202112:53 PM

## 2019-04-12 ENCOUNTER — Telehealth: Payer: Self-pay | Admitting: *Deleted

## 2019-04-12 ENCOUNTER — Other Ambulatory Visit: Payer: Self-pay

## 2019-04-12 ENCOUNTER — Other Ambulatory Visit: Payer: Self-pay | Admitting: Family

## 2019-04-12 DIAGNOSIS — R399 Unspecified symptoms and signs involving the genitourinary system: Secondary | ICD-10-CM

## 2019-04-12 DIAGNOSIS — N3000 Acute cystitis without hematuria: Secondary | ICD-10-CM

## 2019-04-12 LAB — URINE CULTURE

## 2019-04-12 MED ORDER — SULFAMETHOXAZOLE-TRIMETHOPRIM 800-160 MG PO TABS: 1.0000 | ORAL_TABLET | Freq: Two times a day (BID) | ORAL | 0 refills | Status: DC

## 2019-04-12 NOTE — Telephone Encounter (Signed)
Pt notified per order of S. [place] NP that a prescription for Bactrim has been sent in to [facility] [address] for a UTI. Pt appreciative of call and has no questions or concerns at this time.

## 2019-04-12 NOTE — Telephone Encounter (Signed)
-----   Message from Eliezer Bottom, NP sent at 04/12/2019  1:17 PM EST ----- Bactrim DS 1 tablet BID x 3 days sent [facility] for [facility]. Thank you!   Sarah ----- Message ----- From: Buel Ream, Lab [facility] [place] Sent: 04/11/2019   2:41 PM EST To: Eliezer Bottom, NP

## 2019-04-12 NOTE — Progress Notes (Signed)
Pt requesting Ondansetron 4mg  tab.   LOV: 12/23/2018 NOV: Nothing scheduled  Approve?

## 2019-04-13 LAB — CHROMOGRANIN A: Chromogranin A (ng/mL): 92.9 ng/mL (ref 0.0–101.8)

## 2019-04-13 MED ORDER — ONDANSETRON 4 MG PO TBDP: ORAL_TABLET | ORAL | 1 refills | Status: DC

## 2019-04-13 NOTE — Progress Notes (Signed)
Sent in 30 pills.  Orma Flaming, MD [place]

## 2019-04-15 ENCOUNTER — Other Ambulatory Visit: Payer: Self-pay | Admitting: Family Medicine

## 2019-04-15 DIAGNOSIS — M4722 Other spondylosis with radiculopathy, cervical region: Secondary | ICD-10-CM

## 2019-04-15 DIAGNOSIS — J301 Allergic rhinitis due to pollen: Secondary | ICD-10-CM

## 2019-05-17 ENCOUNTER — Other Ambulatory Visit: Payer: Self-pay | Admitting: Family

## 2019-05-17 ENCOUNTER — Encounter: Payer: Self-pay | Admitting: Family

## 2019-05-17 ENCOUNTER — Inpatient Hospital Stay: Payer: 59

## 2019-05-17 ENCOUNTER — Other Ambulatory Visit: Payer: Self-pay

## 2019-05-17 ENCOUNTER — Inpatient Hospital Stay (HOSPITAL_BASED_OUTPATIENT_CLINIC_OR_DEPARTMENT_OTHER): Payer: 59 | Admitting: Family

## 2019-05-17 ENCOUNTER — Inpatient Hospital Stay: Payer: 59 | Attending: Hematology & Oncology

## 2019-05-17 VITALS — BP 149/95 | HR 86 | Temp 98.6°F | Resp 19 | Ht 59.0 in | Wt 236.8 lb

## 2019-05-17 DIAGNOSIS — C7B8 Other secondary neuroendocrine tumors: Secondary | ICD-10-CM

## 2019-05-17 DIAGNOSIS — C7B02 Secondary carcinoid tumors of liver: Secondary | ICD-10-CM | POA: Diagnosis not present

## 2019-05-17 DIAGNOSIS — R110 Nausea: Secondary | ICD-10-CM

## 2019-05-17 DIAGNOSIS — C7A00 Malignant carcinoid tumor of unspecified site: Secondary | ICD-10-CM | POA: Insufficient documentation

## 2019-05-17 LAB — CMP (CANCER CENTER ONLY)
ALT: 20 U/L (ref 0–44)
AST: 18 U/L (ref 15–41)
Albumin: 3.7 g/dL (ref 3.5–5.0)
Alkaline Phosphatase: 87 U/L (ref 38–126)
Anion gap: 9 (ref 5–15)
BUN: 19 mg/dL (ref 6–20)
CO2: 24 mmol/L (ref 22–32)
Calcium: 9 mg/dL (ref 8.9–10.3)
Chloride: 102 mmol/L (ref 98–111)
Creatinine: 0.75 mg/dL (ref 0.44–1.00)
GFR, Est AFR Am: >60 mL/min (ref >60)
GFR, Estimated: >60 mL/min (ref >60)
Glucose, Bld: 155 mg/dL — ABNORMAL HIGH (ref 70–99)
Potassium: 4.4 mmol/L (ref 3.5–5.1)
Sodium: 135 mmol/L (ref 135–145)
Total Bilirubin: 0.4 mg/dL (ref 0.3–1.2)
Total Protein: 6.6 g/dL (ref 6.5–8.1)

## 2019-05-17 LAB — CBC WITH DIFFERENTIAL (CANCER CENTER ONLY)
Abs Immature Granulocytes: 0.04 10*3/uL (ref 0.00–0.07)
Basophils Absolute: 0.1 10*3/uL (ref 0.0–0.1)
Basophils Relative: 1 %
Eosinophils Absolute: 0.2 10*3/uL (ref 0.0–0.5)
Eosinophils Relative: 2 %
HCT: 38.9 % (ref 36.0–46.0)
Hemoglobin: 12.6 g/dL (ref 12.0–15.0)
Immature Granulocytes: 0 %
Lymphocytes Relative: 30 %
Lymphs Abs: 3.2 10*3/uL (ref 0.7–4.0)
MCH: 29.5 pg (ref 26.0–34.0)
MCHC: 32.4 g/dL (ref 30.0–36.0)
MCV: 91.1 fL (ref 80.0–100.0)
Monocytes Absolute: 0.7 10*3/uL (ref 0.1–1.0)
Monocytes Relative: 7 %
Neutro Abs: 6.4 10*3/uL (ref 1.7–7.7)
Neutrophils Relative %: 60 %
Platelet Count: 253 10*3/uL (ref 150–400)
RBC: 4.27 MIL/uL (ref 3.87–5.11)
RDW: 13 % (ref 11.5–15.5)
WBC Count: 10.7 10*3/uL — ABNORMAL HIGH (ref 4.0–10.5)
nRBC: 0 % (ref 0.0–0.2)

## 2019-05-17 MED ORDER — ONDANSETRON 8 MG PO TBDP: ORAL_TABLET | ORAL | 2 refills | Status: DC

## 2019-05-17 MED ORDER — LANREOTIDE ACETATE 120 MG/0.5ML ~~LOC~~ SOLN
120.0000 mg | Freq: Once | SUBCUTANEOUS | Status: AC
  Administered 2019-05-17: 120 mg via SUBCUTANEOUS

## 2019-05-17 NOTE — Progress Notes (Signed)
Hematology and Oncology Follow Up Visit  Leah [name]:[id number] Dec 02, 1960 59 y.o. 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 Cregar is here today for follow-up and injection. She is doing well but did have some nausea last week. This resolved after 2 doses of Zofran. She would like to increase to an 8 mg ODT dose which will be fine.  She will have occasional episodes of palpitations, SOB and upset stomach. This comes and goes.  She follows up with her team at [facility] in early May and will also have a repeat MRI of the liver at that time.  Her Chromogranin A level last month was 92.  No fever, chills, cough, rash, dizziness, chest pain, abdominal pain or changes in bowel or bladder habits at this time. No episodes of bleeding. No bruising or petechiae.   No swelling, tenderness, numbness or tingling in her extremities.  No falls or syncope.  She has maintained a good appetite and is staying well hydrated. Her weight is stable.   ECOG Performance Status: 1 - Symptomatic but completely ambulatory  Medications:  Allergies as of 05/17/2019   No Known Allergies     Medication List       Accurate as of May 17, 2019  1:59 PM. If you have any questions, ask your nurse or doctor.        STOP taking these medications   ipratropium 0.06 % nasal spray Commonly known as: ATROVENT Stopped by: Laverna Peace, NP   methocarbamol 500 MG tablet Commonly known as: ROBAXIN Stopped by: Laverna Peace, NP   mupirocin ointment 2 % Commonly known as: Solicitor Stopped by: Laverna Peace, NP   sulfamethoxazole-trimethoprim 800-160 MG tablet Commonly known as: BACTRIM DS Stopped by: Laverna Peace, NP     TAKE these medications   Acyclovir-Hydrocortisone 5-1 % Crea Commonly known as: Xerese APPLY  TO EFFECTED AREA   amLODipine 10 MG tablet Commonly known as: NORVASC Take 1 tablet (10 mg total) by mouth daily.   aspirin 81 MG EC tablet Take 1 tablet (81 mg total) by mouth daily. Swallow whole.   clonazePAM 0.5 MG tablet Commonly known as: KLONOPIN TAKE 1 TABLET BY MOUTH TWICE A DAY AS NEEDED FOR ANXIETY   fexofenadine 180 MG tablet Commonly known as: ALLEGRA TAKE 1 TABLET BY MOUTH EVERY DAY   fluticasone 50 MCG/ACT nasal spray Commonly known as: FLONASE USE 2 SPRAYS IN BOTH NOSTRILS EVERY DAY What changed: See the new instructions.   gabapentin 100 MG capsule Commonly known as: NEURONTIN TAKE 1 CAPSULE (100 MG TOTAL) BY MOUTH AT BEDTIME.   metoprolol succinate 100 MG 24 hr tablet Commonly known as: TOPROL-XL TAKE 1 TABLET BY MOUTH EVERY EVENING WITH OR IMMEDIATELY FOLLOWING A MEAL   montelukast 10 MG tablet Commonly known as: SINGULAIR TAKE 1 TABLET BY MOUTH EVERYDAY AT BEDTIME   naproxen 500 MG tablet Commonly known as: NAPROSYN TAKE 1 TABLET TWICE A DAY WITH A MEAL   octreotide 30 MG injection Commonly known as: SANDOSTATIN LAR Inject 30 mg into the muscle every 28 (twenty-eight) days.   ondansetron 4 MG disintegrating tablet Commonly known as: ZOFRAN-ODT TAKE 1 TABLET BY MOUTH EVERY 8 HOURS AS NEEDED FOR NAUSEA AND VOMITING   Ospemifene 60 MG Tabs Commonly known as: Osphena Take 1 tablet by mouth  daily.   pantoprazole 40 MG tablet Commonly known as: PROTONIX TAKE 1 TABLET BY MOUTH TWICE A DAY   ProAir HFA 108 (90 Base) MCG/ACT inhaler Generic drug: albuterol INHALE 2 PUFFS INTO THE LUNGS EVERY 6 (SIX) HOURS AS NEEDED. FOR SHORTNESS OF BREATH. What changed: See the new instructions.   Qsymia 7.5-46 MG Cp24 Generic drug: Phentermine-Topiramate Take 1 tablet by mouth daily.   valACYclovir 500 MG tablet Commonly known as: VALTREX TAKE 1 TABLET BY MOUTH TWICE A DAY AS NEEDED FOR COLD SORES   valsartan 320 MG tablet Commonly known as:  DIOVAN Take 1 tablet (320 mg total) by mouth daily.   venlafaxine XR 150 MG 24 hr capsule Commonly known as: EFFEXOR-XR TAKE 1 CAPSULE (150 MG TOTAL) BY MOUTH EACH MORNING       Allergies: No Known Allergies  Past Medical History, Surgical history, Social history, and Family History were reviewed and updated.  Review of Systems: All other 10 point review of systems is negative.   Physical Exam:  height is 4\' 11"  (1.499 m) and weight is 236 lb 12.8 oz (107.4 kg). Her temporal temperature is 98.6 F (37 C). Her blood pressure is 149/95 (abnormal) and her pulse is 86. Her respiration is 19 and oxygen saturation is 99%.   Wt Readings from Last 3 Encounters:  05/17/19 236 lb 12.8 oz (107.4 kg)  04/11/19 231 lb (104.8 kg)  03/14/19 235 lb (106.6 kg)    Ocular: Sclerae unicteric, pupils equal, round and reactive to light Ear-nose-throat: Oropharynx clear, dentition fair Lymphatic: No cervical or supraclavicular adenopathy Lungs no rales or rhonchi, good excursion bilaterally Heart regular rate and rhythm, no murmur appreciated Abd soft, nontender, positive bowel sounds, no liver or spleen tip palpated on exam, no fluid wave  MSK no focal spinal tenderness, no joint edema Neuro: non-focal, well-oriented, appropriate affect Breasts: Deferred   Lab Results  Component Value Date   WBC 10.7 (H) 05/17/2019   HGB 12.6 05/17/2019   HCT 38.9 05/17/2019   MCV 91.1 05/17/2019   PLT 253 05/17/2019   Lab Results  Component Value Date   FERRITIN 382 (H) 10/18/2008   IRON <10 (L) 10/18/2008   TIBC NOT CALC Not calculated due to Iron <10. 10/18/2008   UIBC 156 10/18/2008   IRONPCTSAT NOT CALC Not calculated due to Iron <10. 10/18/2008   Lab Results  Component Value Date   RETICCTPCT 0.9 10/18/2008   RBC 4.27 05/17/2019   No results found for: KPAFRELGTCHN, LAMBDASER, KAPLAMBRATIO No results found for: IGGSERUM, IGA, IGMSERUM No results found for: Odetta Pink, SPEI   Chemistry      Component Value Date/Time   NA 135 05/17/2019 1317   NA 140 01/20/2017 1416   NA 141 01/17/2016 1047   K 4.4 05/17/2019 1317   K 3.9 01/20/2017 1416   K 3.2 (L) 01/17/2016 1047   CL 102 05/17/2019 1317   CL 102 01/20/2017 1416   CO2 24 05/17/2019 1317   CO2 26 01/20/2017 1416   CO2 27 01/17/2016 1047   BUN 19 05/17/2019 1317   BUN 15 01/20/2017 1416   BUN 6.6 (L) 01/17/2016 1047   CREATININE 0.75 05/17/2019 1317   CREATININE 1.1 01/20/2017 1416   CREATININE 0.7 01/17/2016 1047      Component Value Date/Time   CALCIUM 9.0 05/17/2019 1317   CALCIUM 9.1 01/20/2017 1416   CALCIUM 8.7 01/17/2016 1047   ALKPHOS 87 05/17/2019 1317  ALKPHOS 84 01/20/2017 1416   ALKPHOS 90 01/17/2016 1047   AST 18 05/17/2019 1317   AST 34 01/17/2016 1047   ALT 20 05/17/2019 1317   ALT 35 01/20/2017 1416   ALT 38 01/17/2016 1047   BILITOT 0.4 05/17/2019 1317   BILITOT 0.42 01/17/2016 1047       Impression and Plan: Ms. Helmuth is a very pleasant 59 yo caucasian female withlong standing history ofmetastaticcarcinoidwith recurrent liver metastasis. She continues to do well and received her Somatuline injection today. We will plan to see her back in another month.  She will contact our office with any questions or concerns. We can certainly see her sooner if needed.   Laverna Peace, NP 4/6/20211:59 PM

## 2019-05-18 ENCOUNTER — Telehealth: Payer: Self-pay | Admitting: Family

## 2019-05-18 ENCOUNTER — Telehealth: Payer: Self-pay | Admitting: Hematology & Oncology

## 2019-05-18 LAB — CHROMOGRANIN A: Chromogranin A (ng/mL): 106.4 ng/mL — ABNORMAL HIGH (ref 0.0–101.8)

## 2019-05-18 LAB — LACTATE DEHYDROGENASE: LDH: 237 U/L — ABNORMAL HIGH (ref 98–192)

## 2019-05-18 NOTE — Telephone Encounter (Signed)
Called and spoke with patient in regards to appointments scheduled per 4/6 los

## 2019-05-18 NOTE — Telephone Encounter (Signed)
Advised patient of appointments added per 4/6 los

## 2019-05-20 ENCOUNTER — Other Ambulatory Visit: Payer: Self-pay

## 2019-05-20 MED ORDER — ONDANSETRON 4 MG PO TBDP: 4.0000 mg | ORAL_TABLET | Freq: Three times a day (TID) | ORAL | 0 refills | Status: DC | PRN

## 2019-05-20 NOTE — Telephone Encounter (Signed)
[facility] from [facility] stating patient's insurance will only cover the 4 mg tablets of Zofran. Is it ok to send over 4 mg? Please advise.

## 2019-05-25 ENCOUNTER — Other Ambulatory Visit: Payer: Self-pay | Admitting: Family Medicine

## 2019-05-25 DIAGNOSIS — Z1231 Encounter for screening mammogram for malignant neoplasm of breast: Secondary | ICD-10-CM

## 2019-05-27 ENCOUNTER — Ambulatory Visit
Admission: RE | Admit: 2019-05-27 | Discharge: 2019-05-27 | Disposition: A | Discharge status: Home / Self Care | Payer: 59 | Source: Ambulatory Visit

## 2019-05-27 ENCOUNTER — Other Ambulatory Visit: Payer: Self-pay | Admitting: Family Medicine

## 2019-05-27 ENCOUNTER — Other Ambulatory Visit: Payer: Self-pay

## 2019-05-27 DIAGNOSIS — Z1231 Encounter for screening mammogram for malignant neoplasm of breast: Secondary | ICD-10-CM

## 2019-05-29 ENCOUNTER — Other Ambulatory Visit: Payer: Self-pay | Admitting: Family Medicine

## 2019-05-31 ENCOUNTER — Other Ambulatory Visit: Payer: Self-pay | Admitting: Family Medicine

## 2019-05-31 DIAGNOSIS — I10 Essential (primary) hypertension: Secondary | ICD-10-CM

## 2019-06-11 ENCOUNTER — Other Ambulatory Visit: Payer: Self-pay | Admitting: Family Medicine

## 2019-06-11 DIAGNOSIS — F419 Anxiety disorder, unspecified: Secondary | ICD-10-CM

## 2019-06-14 ENCOUNTER — Other Ambulatory Visit: Payer: Self-pay

## 2019-06-14 ENCOUNTER — Other Ambulatory Visit: Payer: Self-pay | Admitting: Family Medicine

## 2019-06-16 ENCOUNTER — Encounter: Payer: Self-pay | Admitting: Hematology & Oncology

## 2019-06-16 ENCOUNTER — Encounter: Payer: Self-pay | Admitting: Family Medicine

## 2019-06-16 ENCOUNTER — Inpatient Hospital Stay (HOSPITAL_BASED_OUTPATIENT_CLINIC_OR_DEPARTMENT_OTHER): Payer: 59 | Admitting: Hematology & Oncology

## 2019-06-16 ENCOUNTER — Inpatient Hospital Stay: Payer: 59

## 2019-06-16 ENCOUNTER — Other Ambulatory Visit: Payer: Self-pay

## 2019-06-16 ENCOUNTER — Inpatient Hospital Stay: Payer: 59 | Attending: Hematology & Oncology

## 2019-06-16 VITALS — BP 176/99 | HR 97 | Temp 97.5°F | Resp 19 | Ht 59.0 in | Wt 235.0 lb

## 2019-06-16 DIAGNOSIS — C7A00 Malignant carcinoid tumor of unspecified site: Secondary | ICD-10-CM | POA: Insufficient documentation

## 2019-06-16 DIAGNOSIS — C7B8 Other secondary neuroendocrine tumors: Secondary | ICD-10-CM | POA: Diagnosis not present

## 2019-06-16 DIAGNOSIS — R110 Nausea: Secondary | ICD-10-CM

## 2019-06-16 DIAGNOSIS — C7B02 Secondary carcinoid tumors of liver: Secondary | ICD-10-CM | POA: Diagnosis not present

## 2019-06-16 LAB — CMP (CANCER CENTER ONLY)
ALT: 18 U/L (ref 0–44)
AST: 20 U/L (ref 15–41)
Albumin: 3.9 g/dL (ref 3.5–5.0)
Alkaline Phosphatase: 91 U/L (ref 38–126)
Anion gap: 11 (ref 5–15)
BUN: 19 mg/dL (ref 6–20)
CO2: 24 mmol/L (ref 22–32)
Calcium: 9.2 mg/dL (ref 8.9–10.3)
Chloride: 104 mmol/L (ref 98–111)
Creatinine: 0.81 mg/dL (ref 0.44–1.00)
GFR, Est AFR Am: >60 mL/min (ref >60)
GFR, Estimated: >60 mL/min (ref >60)
Glucose, Bld: 156 mg/dL — ABNORMAL HIGH (ref 70–99)
Potassium: 4.2 mmol/L (ref 3.5–5.1)
Sodium: 139 mmol/L (ref 135–145)
Total Bilirubin: 0.4 mg/dL (ref 0.3–1.2)
Total Protein: 7 g/dL (ref 6.5–8.1)

## 2019-06-16 LAB — CBC WITH DIFFERENTIAL (CANCER CENTER ONLY)
Abs Immature Granulocytes: 0.07 10*3/uL (ref 0.00–0.07)
Basophils Absolute: 0 10*3/uL (ref 0.0–0.1)
Basophils Relative: 0 %
Eosinophils Absolute: 0.2 10*3/uL (ref 0.0–0.5)
Eosinophils Relative: 2 %
HCT: 40.9 % (ref 36.0–46.0)
Hemoglobin: 13.1 g/dL (ref 12.0–15.0)
Immature Granulocytes: 1 %
Lymphocytes Relative: 31 %
Lymphs Abs: 3.6 10*3/uL (ref 0.7–4.0)
MCH: 29.2 pg (ref 26.0–34.0)
MCHC: 32 g/dL (ref 30.0–36.0)
MCV: 91.1 fL (ref 80.0–100.0)
Monocytes Absolute: 0.7 10*3/uL (ref 0.1–1.0)
Monocytes Relative: 6 %
Neutro Abs: 7.1 10*3/uL (ref 1.7–7.7)
Neutrophils Relative %: 60 %
Platelet Count: 284 10*3/uL (ref 150–400)
RBC: 4.49 MIL/uL (ref 3.87–5.11)
RDW: 13.5 % (ref 11.5–15.5)
WBC Count: 11.7 10*3/uL — ABNORMAL HIGH (ref 4.0–10.5)
nRBC: 0 % (ref 0.0–0.2)

## 2019-06-16 MED ORDER — LANREOTIDE ACETATE 120 MG/0.5ML ~~LOC~~ SOLN
120.0000 mg | Freq: Once | SUBCUTANEOUS | Status: AC
  Administered 2019-06-16: 120 mg via SUBCUTANEOUS

## 2019-06-16 MED ORDER — LANREOTIDE ACETATE 120 MG/0.5ML ~~LOC~~ SOLN
SUBCUTANEOUS | Status: AC
  Filled 2019-06-16: qty 120

## 2019-06-16 NOTE — Patient Instructions (Signed)
Lanreotide injection What is this medicine? LANREOTIDE (lan REE oh tide) is used to reduce blood levels of growth hormone in patients with a condition called acromegaly. It also works to slow or stop tumor growth in patients with neuroendocrine tumors and treat carcinoid syndrome. This medicine may be used for other purposes; ask your health care provider or pharmacist if you have questions. COMMON BRAND NAME(S): Somatuline [place] What should I tell my health care provider before I take this medicine? They need to know if you have any of these conditions:  diabetes  gallbladder disease  heart disease  kidney disease  liver disease  thyroid disease  an unusual or allergic reaction to lanreotide, other medicines, foods, dyes, or preservatives  pregnant or trying to get pregnant  breast-feeding How should I use this medicine? This medicine is for injection under the skin. It is given by a health care professional in a hospital or clinic setting. Contact your pediatrician or health care professional regarding the use of this medicine in children. Special care may be needed. Overdosage: If you think you have taken too much of this medicine contact a poison control center or emergency room at once. NOTE: This medicine is only for you. Do not share this medicine with others. What if I miss a dose? It is important not to miss your dose. Call your doctor or health care professional if you are unable to keep an appointment. What may interact with this medicine? This medicine may interact with the following medications:  bromocriptine  cyclosporine  certain medicines for blood pressure, heart disease, irregular heart beat  certain medicines for diabetes  quinidine  terfenadine This list may not describe all possible interactions. Give your health care provider a list of all the medicines, herbs, non-prescription drugs, or dietary supplements you use. Also tell them if you smoke,  drink alcohol, or use illegal drugs. Some items may interact with your medicine. What should I watch for while using this medicine? Tell your doctor or healthcare professional if your symptoms do not start to get better or if they get worse. Visit your doctor or health care professional for regular checks on your progress. Your condition will be monitored carefully while you are receiving this medicine. This medicine may increase blood sugar. Ask your healthcare provider if changes in diet or medicines are needed if you have diabetes. You may need blood work done while you are taking this medicine. Women should inform their doctor if they wish to become pregnant or think they might be pregnant. There is a potential for serious side effects to an unborn child. Talk to your health care professional or pharmacist for more information. Do not breast-feed an infant while taking this medicine or for 6 months after stopping it. This medicine has caused ovarian failure in some women. This medicine may interfere with the ability to have a child. Talk with your doctor or health care professional if you are concerned about your fertility. What side effects may I notice from receiving this medicine? Side effects that you should report to your doctor or health care professional as soon as possible:  allergic reactions like skin rash, itching or hives, swelling of the face, lips, or tongue  increased blood pressure  severe stomach pain  signs and symptoms of hgh blood sugar such as being more thirsty or hungry or having to urinate more than normal. You may also feel very tired or have blurry vision.  signs and symptoms of low blood   sugar such as feeling anxious; confusion; dizziness; increased hunger; unusually weak or tired; sweating; shakiness; cold; irritable; headache; blurred vision; fast heartbeat; loss of consciousness  unusually slow heartbeat Side effects that usually do not require medical  attention (report to your doctor or health care professional if they continue or are bothersome):  constipation  diarrhea  dizziness  headache  muscle pain  muscle spasms  nausea  pain, redness, or irritation at site where injected This list may not describe all possible side effects. Call your doctor for medical advice about side effects. You may report side effects to FDA at 1-800-FDA-1088. Where should I keep my medicine? This drug is given in a hospital or clinic and will not be stored at home. NOTE: This sheet is a summary. It may not cover all possible information. If you have questions about this medicine, talk to your doctor, pharmacist, or health care provider.  2020 [employer]vier/Gold Standard (2017-11-05 09:13:08)  

## 2019-06-16 NOTE — Progress Notes (Signed)
Hematology and Oncology Follow Up Visit  Leah [name] [id number] [birth year] 59 y.o. 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  She feels okay.  She really has no specific complaints.  There is been no problems with nausea or vomiting.  She has had no fever.  There has been no cough or shortness of breath.  She has had no wheezing.  There has been no problems with diarrhea.  She has had her last MRI at [facility] in November.  This really looked good.  2 lesions appear to be smaller in 1 actually gone.  She does not have to see the doctors at [name] until November.  Her last chromogranin A level was 106 in early April.  She has had no problems with fever.  There has been no cough.  She has had no leg swelling.  She has had no rashes.  There has been no headache.  She is on a dietary pill to try to lose some weight.  Overall, I would have to say her performance status is ECOG 0.    Medications:  Allergies as of 06/16/2019   No Known Allergies     Medication List       Accurate as of Jun 16, 2019  2:44 PM. If you have any questions, ask your nurse or doctor.        Acyclovir-Hydrocortisone 5-1 % Crea Commonly known as: Xerese APPLY TO EFFECTED AREA   amLODipine 10 MG tablet Commonly known as: NORVASC Take 1 tablet (10 mg total) by mouth daily.   aspirin 81 MG EC tablet TAKE 1 TABLET BY MOUTH EVERY DAY *SWALLOW WHOLE*   clonazePAM 0.5 MG tablet Commonly known as: KLONOPIN TAKE 1 TABLET BY MOUTH TWICE A DAY AS NEEDED FOR ANXIETY   fexofenadine 180 MG tablet Commonly known as: ALLEGRA TAKE 1 TABLET BY MOUTH EVERY DAY   fluticasone 50 MCG/ACT nasal spray Commonly known as: FLONASE USE 2 SPRAYS IN BOTH NOSTRILS EVERY DAY What  changed: See the new instructions.   gabapentin 100 MG capsule Commonly known as: NEURONTIN TAKE 1 CAPSULE (100 MG TOTAL) BY MOUTH AT BEDTIME.   metoprolol succinate 100 MG 24 hr tablet Commonly known as: TOPROL-XL TAKE 1 TABLET BY MOUTH EVERY EVENING WITH OR IMMEDIATELY FOLLOWING A MEAL   montelukast 10 MG tablet Commonly known as: SINGULAIR TAKE 1 TABLET BY MOUTH EVERYDAY AT BEDTIME   naproxen 500 MG tablet Commonly known as: NAPROSYN TAKE 1 TABLET TWICE A DAY WITH A MEAL   octreotide 30 MG injection Commonly known as: SANDOSTATIN LAR Inject 30 mg into the muscle every 28 (twenty-eight) days.   ondansetron 4 MG disintegrating tablet Commonly known as: ZOFRAN-ODT TAKE 1 TABLET BY MOUTH EVERY 8 HOURS AS NEEDED FOR NAUSEA AND VOMITING   Ospemifene 60 MG Tabs Commonly known as: Osphena Take 1 tablet by mouth daily.   pantoprazole 40 MG tablet Commonly known as: PROTONIX TAKE 1 TABLET BY MOUTH TWICE A DAY   ProAir HFA 108 (90 Base) MCG/ACT inhaler Generic drug: albuterol INHALE 2 PUFFS INTO THE LUNGS EVERY 6 (SIX) HOURS AS NEEDED. FOR SHORTNESS OF BREATH. What changed: See the new instructions.   Qsymia 7.5-46 MG Cp24 Generic drug: Phentermine-Topiramate Take 1 tablet by mouth daily.   valACYclovir 500 MG  tablet Commonly known as: VALTREX TAKE 1 TABLET BY MOUTH TWICE A DAY AS NEEDED FOR COLD SORES   valsartan 320 MG tablet Commonly known as: DIOVAN TAKE 1 TABLET BY MOUTH EVERY DAY   venlafaxine XR 150 MG 24 hr capsule Commonly known as: EFFEXOR-XR TAKE 1 CAPSULE (150 MG TOTAL) BY MOUTH EACH MORNING       Allergies: No Known Allergies  Past Medical History, Surgical history, Social history, and Family History were reviewed and updated.  Review of Systems: Review of Systems  Constitutional: Negative.   HENT: Negative.   Eyes: Negative.   Respiratory: Negative.   Cardiovascular: Negative.   Gastrointestinal: Negative.   Genitourinary: Negative.     Musculoskeletal: Negative.   Skin: Negative.   Neurological: Negative.   Endo/Heme/Allergies: Negative.   Psychiatric/Behavioral: Negative.      Physical Exam:  vitals were not taken for this visit.   Wt Readings from Last 3 Encounters:  05/17/19 236 lb 12.8 oz (107.4 kg)  04/11/19 231 lb (104.8 kg)  03/14/19 235 lb (106.6 kg)    Physical Exam Vitals reviewed.  HENT:     Head: Normocephalic and atraumatic.  Eyes:     Pupils: Pupils are equal, round, and reactive to light.  Cardiovascular:     Rate and Rhythm: Normal rate and regular rhythm.     Heart sounds: Normal heart sounds.  Pulmonary:     Effort: Pulmonary effort is normal.     Breath sounds: Normal breath sounds.  Abdominal:     General: Bowel sounds are normal.     Palpations: Abdomen is soft.     Comments: Abdominal exam shows an obese abdomen that is soft.  She has good bowel sounds.  There is no fluid wave.  She has laparotomy scars that are well-healed.  There is no tenderness.  There is no guarding or rebound.  There is no palpable liver or spleen tip.  Musculoskeletal:        General: No tenderness or deformity. Normal range of motion.     Cervical back: Normal range of motion.  Lymphadenopathy:     Cervical: No cervical adenopathy.  Skin:    General: Skin is warm and dry.     Findings: No erythema or rash.  Neurological:     Mental Status: She is alert and oriented to person, place, and time.  Psychiatric:        Behavior: Behavior normal.        Thought Content: Thought content normal.        Judgment: Judgment normal.      Lab Results  Component Value Date   WBC 11.7 (H) 06/16/2019   HGB 13.1 06/16/2019   HCT 40.9 06/16/2019   MCV 91.1 06/16/2019   PLT 284 06/16/2019   Lab Results  Component Value Date   FERRITIN 382 (H) 10/18/2008   IRON <10 (L) 10/18/2008   TIBC NOT CALC Not calculated due to Iron <10. 10/18/2008   UIBC 156 10/18/2008   IRONPCTSAT NOT CALC Not calculated due to  Iron <10. 10/18/2008   Lab Results  Component Value Date   RETICCTPCT 0.9 10/18/2008   RBC 4.49 06/16/2019   No results found for: KPAFRELGTCHN, LAMBDASER, KAPLAMBRATIO No results found for: IGGSERUM, IGA, IGMSERUM No results found for: Ronnald Ramp, A1GS, A2GS, Violet Baldy, MSPIKE, SPEI   Chemistry      Component Value Date/Time   NA 139 06/16/2019 1410   NA 140 01/20/2017 1416  NA 141 01/17/2016 1047   K 4.2 06/16/2019 1410   K 3.9 01/20/2017 1416   K 3.2 (L) 01/17/2016 1047   CL 104 06/16/2019 1410   CL 102 01/20/2017 1416   CO2 24 06/16/2019 1410   CO2 26 01/20/2017 1416   CO2 27 01/17/2016 1047   BUN 19 06/16/2019 1410   BUN 15 01/20/2017 1416   BUN 6.6 (L) 01/17/2016 1047   CREATININE 0.81 06/16/2019 1410   CREATININE 1.1 01/20/2017 1416   CREATININE 0.7 01/17/2016 1047      Component Value Date/Time   CALCIUM 9.2 06/16/2019 1410   CALCIUM 9.1 01/20/2017 1416   CALCIUM 8.7 01/17/2016 1047   ALKPHOS 91 06/16/2019 1410   ALKPHOS 84 01/20/2017 1416   ALKPHOS 90 01/17/2016 1047   AST 20 06/16/2019 1410   AST 34 01/17/2016 1047   ALT 18 06/16/2019 1410   ALT 35 01/20/2017 1416   ALT 38 01/17/2016 1047   BILITOT 0.4 06/16/2019 1410   BILITOT 0.42 01/17/2016 1047       Impression and Plan: Ms. Conradi is a very pleasant 59 yo caucasian female withlong standing history ofmetastaticcarcinoidwith recurrent liver metastasis.  We will have to see what the chromogranin A level is.  Hopefully it is no higher.  If it is, we might have to think about increasing the frequency of the Somatuline.  She comes in monthly for Somatuline.  We will see her back in 1 month.    [name], MD 5/6/20212:44 PM

## 2019-06-17 LAB — CHROMOGRANIN A: Chromogranin A (ng/mL): 94.5 ng/mL (ref 0.0–101.8)

## 2019-06-17 LAB — LACTATE DEHYDROGENASE: LDH: 221 U/L — ABNORMAL HIGH (ref 98–192)

## 2019-06-19 ENCOUNTER — Other Ambulatory Visit: Payer: Self-pay | Admitting: Family Medicine

## 2019-06-21 ENCOUNTER — Other Ambulatory Visit: Payer: Self-pay | Admitting: Family Medicine

## 2019-06-21 DIAGNOSIS — M4722 Other spondylosis with radiculopathy, cervical region: Secondary | ICD-10-CM

## 2019-06-22 ENCOUNTER — Other Ambulatory Visit: Payer: Self-pay | Admitting: Family Medicine

## 2019-06-22 DIAGNOSIS — J301 Allergic rhinitis due to pollen: Secondary | ICD-10-CM

## 2019-06-24 ENCOUNTER — Other Ambulatory Visit: Payer: Self-pay | Admitting: Family Medicine

## 2019-06-24 NOTE — Telephone Encounter (Signed)
Pt pharmacy requesting Ondansetron 4mg  tab  LOV: 12/23/2018 No upcoming visits Last refill: 06/13/2019  Please Advise

## 2019-06-25 ENCOUNTER — Encounter: Payer: Self-pay | Admitting: Hematology & Oncology

## 2019-06-28 ENCOUNTER — Ambulatory Visit (INDEPENDENT_AMBULATORY_CARE_PROVIDER_SITE_OTHER): Payer: Self-pay | Admitting: Family Medicine

## 2019-07-03 ENCOUNTER — Other Ambulatory Visit: Payer: Self-pay | Admitting: Family Medicine

## 2019-07-03 DIAGNOSIS — M4722 Other spondylosis with radiculopathy, cervical region: Secondary | ICD-10-CM

## 2019-07-04 ENCOUNTER — Ambulatory Visit: Payer: 59 | Admitting: Family Medicine

## 2019-07-06 ENCOUNTER — Ambulatory Visit (INDEPENDENT_AMBULATORY_CARE_PROVIDER_SITE_OTHER): Payer: 59 | Admitting: Family Medicine

## 2019-07-06 ENCOUNTER — Encounter: Payer: Self-pay | Admitting: Family Medicine

## 2019-07-06 ENCOUNTER — Other Ambulatory Visit: Payer: Self-pay

## 2019-07-06 ENCOUNTER — Other Ambulatory Visit: Payer: 59

## 2019-07-06 VITALS — BP 122/78 | HR 68 | Temp 97.0°F | Ht 59.0 in | Wt 230.0 lb

## 2019-07-06 DIAGNOSIS — F419 Anxiety disorder, unspecified: Secondary | ICD-10-CM

## 2019-07-06 DIAGNOSIS — I152 Hypertension secondary to endocrine disorders: Secondary | ICD-10-CM

## 2019-07-06 DIAGNOSIS — I10 Essential (primary) hypertension: Secondary | ICD-10-CM

## 2019-07-06 DIAGNOSIS — R946 Abnormal results of thyroid function studies: Secondary | ICD-10-CM

## 2019-07-06 DIAGNOSIS — R310 Gross hematuria: Secondary | ICD-10-CM | POA: Diagnosis not present

## 2019-07-06 DIAGNOSIS — E1169 Type 2 diabetes mellitus with other specified complication: Secondary | ICD-10-CM

## 2019-07-06 DIAGNOSIS — E785 Hyperlipidemia, unspecified: Secondary | ICD-10-CM

## 2019-07-06 DIAGNOSIS — E1159 Type 2 diabetes mellitus with other circulatory complications: Secondary | ICD-10-CM

## 2019-07-06 LAB — MICROALBUMIN / CREATININE URINE RATIO
Creatinine,U: 130.8 mg/dL
Microalb Creat Ratio: 0.7 mg/g (ref 0.0–30.0)
Microalb, Ur: 0.9 mg/dL (ref 0.0–1.9)

## 2019-07-06 LAB — CBC WITH DIFFERENTIAL/PLATELET
Basophils Absolute: 0 10*3/uL (ref 0.0–0.1)
Basophils Relative: 0.3 % (ref 0.0–3.0)
Eosinophils Absolute: 0.1 10*3/uL (ref 0.0–0.7)
Eosinophils Relative: 0.4 % (ref 0.0–5.0)
HCT: 39.5 % (ref 36.0–46.0)
Hemoglobin: 13.1 g/dL (ref 12.0–15.0)
Lymphocytes Relative: 27 % (ref 12.0–46.0)
Lymphs Abs: 3.6 10*3/uL (ref 0.7–4.0)
MCHC: 33.1 g/dL (ref 30.0–36.0)
MCV: 90.1 fl (ref 78.0–100.0)
Monocytes Absolute: 1 10*3/uL (ref 0.1–1.0)
Monocytes Relative: 7.5 % (ref 3.0–12.0)
Neutro Abs: 8.7 10*3/uL — ABNORMAL HIGH (ref 1.4–7.7)
Neutrophils Relative %: 64.8 % (ref 43.0–77.0)
Platelets: 304 10*3/uL (ref 150.0–400.0)
RBC: 4.39 Mil/uL (ref 3.87–5.11)
RDW: 14.9 % (ref 11.5–15.5)
WBC: 13.4 10*3/uL — ABNORMAL HIGH (ref 4.0–10.5)

## 2019-07-06 LAB — COMPREHENSIVE METABOLIC PANEL
ALT: 28 U/L (ref 0–35)
AST: 24 U/L (ref 0–37)
Albumin: 3.8 g/dL (ref 3.5–5.2)
Alkaline Phosphatase: 76 U/L (ref 39–117)
BUN: 24 mg/dL — ABNORMAL HIGH (ref 6–23)
CO2: 24 mEq/L (ref 19–32)
Calcium: 9 mg/dL (ref 8.4–10.5)
Chloride: 104 mEq/L (ref 96–112)
Creatinine, Ser: 0.74 mg/dL (ref 0.40–1.20)
GFR: 79.97 mL/min (ref >60.00)
Glucose, Bld: 158 mg/dL — ABNORMAL HIGH (ref 70–99)
Potassium: 4.8 mEq/L (ref 3.5–5.1)
Sodium: 136 mEq/L (ref 135–145)
Total Bilirubin: 0.5 mg/dL (ref 0.2–1.2)
Total Protein: 6.5 g/dL (ref 6.0–8.3)

## 2019-07-06 LAB — TSH: TSH: 1.69 u[IU]/mL (ref 0.35–4.50)

## 2019-07-06 LAB — POCT URINALYSIS DIPSTICK
Bilirubin, UA: NEGATIVE
Blood, UA: NEGATIVE
Glucose, UA: NEGATIVE
Ketones, UA: NEGATIVE
Leukocytes, UA: NEGATIVE
Nitrite, UA: NEGATIVE
Protein, UA: POSITIVE — AB
Spec Grav, UA: >=1.03 — AB (ref 1.010–1.025)
Urobilinogen, UA: 1 E.U./dL
pH, UA: 6 (ref 5.0–8.0)

## 2019-07-06 LAB — URINALYSIS, ROUTINE W REFLEX MICROSCOPIC
Bilirubin Urine: NEGATIVE
Hgb urine dipstick: NEGATIVE
Ketones, ur: NEGATIVE
Leukocytes,Ua: NEGATIVE
Nitrite: NEGATIVE
Specific Gravity, Urine: >=1.03 — AB (ref 1.000–1.030)
Total Protein, Urine: NEGATIVE
Urine Glucose: NEGATIVE
Urobilinogen, UA: 0.2 (ref 0.0–1.0)
pH: 6 (ref 5.0–8.0)

## 2019-07-06 LAB — HEMOGLOBIN A1C: Hgb A1c MFr Bld: 7.3 % — ABNORMAL HIGH (ref 4.6–6.5)

## 2019-07-06 MED ORDER — ROSUVASTATIN CALCIUM 10 MG PO TABS
10.0000 mg | ORAL_TABLET | Freq: Every day | ORAL | 3 refills | Status: DC
Start: 2019-07-06 — End: 2020-08-20

## 2019-07-06 MED ORDER — OSPHENA 60 MG PO TABS: 1.0000 | ORAL_TABLET | Freq: Every day | ORAL | 3 refills | Status: DC

## 2019-07-06 NOTE — Patient Instructions (Signed)
1) starting a cholesterol medication called crestor for you. Take at night. When you come back in 3 months we can recheck your cholesterol and labs.   2) will see what a1c is see if you need medication.   3) see you back in 3 months!    Come back!!!! Enjoy your summer!  Dr. Rogers Blocker

## 2019-07-06 NOTE — Progress Notes (Signed)
Patient: Leah [name] MRN: [id number] DOB: 1960-06-14 PCP: Orma Flaming, MD     Subjective:  Chief Complaint  Patient presents with  . Hematuria  . Diabetes  . Hyperlipidemia  . Hypertension  . abnormal thyroid lab  . Anxiety    HPI: The patient is a 59 y.o. female who presents today for Blood in her urine and follow up.   Gross hematuria Started in 06-03-2022 that she saw blood in her urine. She was put on antibiotic. UA showed no blood and culture appeared contaminated. Results in chart. She no longer has any symptoms and does not see blood. She has no itching but had yeast on her last ua. She wanted to follow up on this.   Type 2 diabetes I last saw her in November of 2020. Her a1c was 7.1 and she never followed up like I asked her to. She has been on no medication. She knows she should have followed up with me. She is starting to see healthy weight and wellness for weight loss and is on a weight loss medication. She has lost 5 pounds. She is not exercising. She does try to watch her diet. She is on no statin and we discussed starting this last year, but she never followed up. She has had her eye exam done in April and no diabetic retinopathy.   The 10-year ASCVD risk score Mikey Bussing DC Brooke Bonito., et al., 06-03-11) is: 7.3% Dad died of MI/CAD, but was smoker.   Hypertension Hypertension: Here for follow up of hypertension.  Currently on norvasc 10mg , metoprolol 100mg  and diovan 320mg  . Home readings range from [id number] diastolic. Takes medication as prescribed and denies any side effects. Exercise includes none.  Weight has been stable. Denies any chest pain, headaches, shortness of breath, vision changes, swelling in lower extremities.    Abnormal thyroid lab Never followed up, will repeat today. Appears euthyroid.   Anxiety Currently on effexor 150mg  and klonopin PRN. She does take on a very as needed basis. 30 pills lasts her 5-6 months. Last filled may of this year. Does not need  a refill. Takes appropriately. She feels like her medication is working well, but has a lot of stressors in her life with her house, people living with her, but she is coping well.   Due for cscope, has GI doc and has this coming up.    Review of Systems  Constitutional: Negative for chills, fatigue and fever.  HENT: Negative for dental problem, ear pain, hearing loss and trouble swallowing.   Eyes: Negative for visual disturbance.  Respiratory: Negative for cough, chest tightness, shortness of breath and wheezing.   Cardiovascular: Negative for chest pain, palpitations and leg swelling.  Gastrointestinal: Negative for abdominal pain, blood in stool, diarrhea, nausea and vomiting.  Endocrine: Negative for cold intolerance, polydipsia, polyphagia and polyuria.  Genitourinary: Positive for frequency. Negative for dysuria and hematuria.  Musculoskeletal: Negative for arthralgias.  Skin: Negative for rash.  Neurological: Negative for dizziness, light-headedness and headaches.  Psychiatric/Behavioral: Negative for dysphoric mood and sleep disturbance. The patient is not nervous/anxious.     Allergies Patient has No Known Allergies.  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  Surgical History  Patient  has a past surgical history that includes Tonsillectomy (1976); Cesarean section [id number]); Cholecystectomy (2006); Abdominal hysterectomy (2004); Oophorectomy (2004); Tubal ligation (Bilateral, 2001); Knee arthroscopy (Left, 09/2010); Breast biopsy (Left); Reconstruction breast w/ latissimus dorsi flap  (Bilateral); Laparoscopic hepatectomy; Augmentation mammaplasty; and Mastectomy (Bilateral, 2008).  Family History Pateint's family history includes Arthritis in her maternal grandfather, maternal grandmother, paternal grandfather, and paternal grandmother; Breast cancer (age of onset: 56) in her maternal aunt; Breast cancer (age of onset: 19) in her mother; Breast cancer (age of onset: 37) in her maternal aunt; Heart failure in her father and paternal grandfather; Hyperlipidemia in her father; Hypertension in her father; Lupus in her mother; Ovarian cancer (age of onset: 70) in her maternal aunt.  Social History Patient  reports that she has never smoked. She has never used smokeless tobacco. She reports that she does not drink alcohol or use drugs.    Objective: Vitals:   07/06/19 0824  BP: 122/78  Pulse: 68  Temp: (!) 97 F (36.1 C)  TempSrc: Temporal  SpO2: 99%  Weight: 230 lb (104.3 kg)  Height: 4\' 11"  (1.499 m)    Body mass index is 46.45 kg/m.  Physical Exam Vitals reviewed.  Constitutional:      Appearance: Normal appearance. She is well-developed. She is obese.  HENT:     Head: Normocephalic and atraumatic.     Right Ear: Tympanic membrane, ear canal and external ear normal. There is impacted cerumen.     Left Ear: Tympanic membrane, ear canal and external ear normal.     Nose: Nose normal.     Mouth/Throat:     Mouth: Mucous membranes are moist.  Eyes:     Extraocular Movements: Extraocular movements intact.     Conjunctiva/sclera: Conjunctivae normal.     Pupils: Pupils are equal, round, and reactive to light.  Neck:     Thyroid: No thyromegaly.     Vascular: No carotid bruit.  Cardiovascular:     Rate and Rhythm: Normal rate and regular rhythm.     Heart sounds: Normal heart sounds. No murmur.  Pulmonary:     Effort: Pulmonary effort is normal.     Breath sounds: Normal breath sounds.  Abdominal:     General: Bowel sounds are normal. There is no  distension.     Palpations: Abdomen is soft.     Tenderness: There is no abdominal tenderness.  Musculoskeletal:     Cervical back: Normal range of motion and neck supple.  Lymphadenopathy:     Cervical: No cervical adenopathy.  Skin:    General: Skin is warm and dry.     Capillary Refill: Capillary refill takes less than 2 seconds.     Findings: No rash.  Neurological:     General: No focal deficit present.     Mental Status: She is alert and oriented to person, place, and time.     Cranial Nerves: No cranial nerve deficit.     Coordination: Coordination normal.     Deep Tendon Reflexes: Reflexes normal.  Psychiatric:        Mood and Affect: Mood normal.        Behavior: Behavior normal.      Office Visit from 07/06/2019 in [place]  PHQ-2 Total Score  0      GAD 7 : Generalized Anxiety Score 07/06/2019 12/23/2018 10/23/2017  Nervous, Anxious, on Edge 0 0 2  Control/stop worrying 0 0 3  Worry too much -  different things 0 0 3  Trouble relaxing 0 0 3  Restless 0 0 1  Easily annoyed or irritable 2 0 2  Afraid - awful might happen 0 0 2  Total GAD 7 Score 2 0 16  Anxiety Difficulty - Not difficult at all -   UA: no blood.      Assessment/plan: 1. Type 2 diabetes mellitus with other specified complication, without long-term current use of insulin ([place]) Discussed importance of following up as requested. We will see what her a1c is today and then figure out medication. Starting statin. Eye exam utd. Needs foot exam at f/u. She is working on weight loss and exercise. Will likely start metformin if need anything. Want her to f/u in 3 months.  - Microalbumin / creatinine urine ratio  2. Hypertension associated with diabetes ([place]) Blood pressure is to goal. Continue current anti-hypertensive medications per hpi. Refills not given and routine lab work will be done today. Recommended routine exercise and healthy diet including DASH diet and mediterranean  diet. Encouraged weight loss. F/u in 12 months.   - Comprehensive metabolic panel - CBC with Differential/Platelet  3. Hematuria, gross None seen on UA, checking micro and culture. She is asymptomatic. Also had no blood on her last UA in march. No f/u needed.  - POCT Urinalysis Dipstick - Urine Culture - Urinalysis, Routine w reflex microscopic  4. Hyperlipidemia associated with type 2 diabetes mellitus ([place]) Needs statin with diabetes. Starting crestor today. Side effects discussed. Px sent in and will recheck labs in 3 months.   5. Anxiety GAD7 very well controlled. Continue medication. F/u in 6 months.   6. Abnormal thyroid function test  - TSH   This visit occurred during the SARS-CoV-2 public health emergency.  Safety protocols were in place, including screening questions prior to the visit, additional usage of staff PPE, and extensive cleaning of exam room while observing appropriate contact time as indicated for disinfecting solutions.    Return in about 3 months (around 10/06/2019) for diabetes. Orma Flaming, MD [place]   07/06/2019

## 2019-07-07 ENCOUNTER — Other Ambulatory Visit: Payer: Self-pay | Admitting: Family Medicine

## 2019-07-07 ENCOUNTER — Ambulatory Visit (INDEPENDENT_AMBULATORY_CARE_PROVIDER_SITE_OTHER): Payer: 59 | Admitting: Family Medicine

## 2019-07-07 ENCOUNTER — Encounter (INDEPENDENT_AMBULATORY_CARE_PROVIDER_SITE_OTHER): Payer: Self-pay | Admitting: Family Medicine

## 2019-07-07 VITALS — BP 137/78 | HR 76 | Temp 98.1°F | Ht 59.0 in | Wt 226.0 lb

## 2019-07-07 DIAGNOSIS — F3289 Other specified depressive episodes: Secondary | ICD-10-CM

## 2019-07-07 DIAGNOSIS — I152 Hypertension secondary to endocrine disorders: Secondary | ICD-10-CM

## 2019-07-07 DIAGNOSIS — R5383 Other fatigue: Secondary | ICD-10-CM

## 2019-07-07 DIAGNOSIS — E1169 Type 2 diabetes mellitus with other specified complication: Secondary | ICD-10-CM

## 2019-07-07 DIAGNOSIS — Z0289 Encounter for other administrative examinations: Secondary | ICD-10-CM

## 2019-07-07 DIAGNOSIS — R0602 Shortness of breath: Secondary | ICD-10-CM

## 2019-07-07 DIAGNOSIS — E1159 Type 2 diabetes mellitus with other circulatory complications: Secondary | ICD-10-CM

## 2019-07-07 DIAGNOSIS — E785 Hyperlipidemia, unspecified: Secondary | ICD-10-CM

## 2019-07-07 DIAGNOSIS — I10 Essential (primary) hypertension: Secondary | ICD-10-CM

## 2019-07-07 DIAGNOSIS — Z6841 Body Mass Index (BMI) 40.0 and over, adult: Secondary | ICD-10-CM

## 2019-07-07 DIAGNOSIS — Z9189 Other specified personal risk factors, not elsewhere classified: Secondary | ICD-10-CM | POA: Diagnosis not present

## 2019-07-07 DIAGNOSIS — J301 Allergic rhinitis due to pollen: Secondary | ICD-10-CM

## 2019-07-07 LAB — URINE CULTURE
MICRO NUMBER:: 10522523
SPECIMEN QUALITY:: ADEQUATE

## 2019-07-07 MED ORDER — METFORMIN HCL 500 MG PO TABS
500.0000 mg | ORAL_TABLET | Freq: Two times a day (BID) | ORAL | 3 refills | Status: DC
Start: 2019-07-07 — End: 2020-09-11

## 2019-07-07 NOTE — Progress Notes (Signed)
Chief Complaint:   OBESITY CEARRA NORWICK (MR# [id number]) is a 59 y.o. female who presents for evaluation and treatment of obesity and related comorbidities. Current BMI is Body mass index is 45.65 kg/m. Delee has been struggling with her weight for many years and has been unsuccessful in either losing weight, maintaining weight loss, or reaching her healthy weight goal.  Jonatha is currently in the action stage of change and ready to dedicate time achieving and maintaining a healthier weight. Danyell is interested in becoming our patient and working on intensive lifestyle modifications including (but not limited to) diet and exercise for weight loss.  Izzabella's habits were reviewed today and are as follows: Her family eats meals together, she thinks her family will eat healthier with her, she struggles with family and or coworkers weight loss sabotage, her desired weight loss is 31 lbs, she started gaining weight when her knee stopped working, her heaviest weight ever was 235 pounds, she frequently eats larger portions than normal and she struggles with emotional eating.  Depression Screen Delight's Food and Mood (modified PHQ-9) score was 11.  Depression screen [facility] 2/9 07/07/2019  Decreased Interest 3  Down, Depressed, Hopeless 1  PHQ - 2 Score 4  Altered sleeping 1  Tired, decreased energy 2  Change in appetite 1  Feeling bad or failure about yourself  1  Trouble concentrating 1  Moving slowly or fidgety/restless 1  Suicidal thoughts 0  PHQ-9 Score 11  Difficult doing work/chores Extremely dIfficult  Some recent data might be hidden   Subjective:   1. Other fatigue Electra admits to daytime somnolence and denies waking up still tired. Patent has a history of symptoms of daytime fatigue. Antanae generally gets 8 hours of sleep per night, and states that she has generally restful sleep. Snoring is not present. Apneic episodes are not present. Epworth Sleepiness Score is 4.  2.  Shortness of breath on exertion Taje notes increasing shortness of breath with exercising and seems to be worsening over time with weight gain. She notes getting out of breath sooner with activity than she used to. This has not gotten worse recently. Jimisha denies shortness of breath at rest or orthopnea.  3. Type 2 diabetes mellitus with other specified complication, without long-term current use of insulin (HCC) Deziree's A1c on 07/06/2019 was 7.6 and BG was 158. I reviewed primary care physician notes from her office visit yesterday.  4. Hypertension associated with type 2 diabetes mellitus ([place]) Oza's blood pressure is at goal at her office visit today. She is on amlodipine 10 mg q daily, metoprolol 100 mg q daily, and valsartan 320 mg q daily.  5. Hyperlipidemia associated with type 2 diabetes mellitus ([place]) Lunabella's primary care physician started her on rosuvastatin 10 mg yesterday.  6. Other depression with emotional eating Kemora's PHQ-9 score is 11. She is on venlafaxine 150 mg q daily.  7. At risk for activity intolerance Leniya is at risk for exercise intolerance due to chronic musculoskeletal pain and difficulty walking.  Assessment/Plan:   1. Other fatigue Aalanah does feel that her weight is causing her energy to be lower than it should be. Fatigue may be related to obesity, depression or many other causes. Labs will be ordered, and in the meanwhile, Gwendoline will focus on self care including making healthy food choices, increasing physical activity and focusing on stress reduction.  - EKG 12-Lead - Vitamin B12 - Folate  2. Shortness of breath on exertion  Rajvi does feel that she gets out of breath more easily that she used to when she exercises. Marquia's shortness of breath appears to be obesity related and exercise induced. She has agreed to work on weight loss and gradually increase exercise to treat her exercise induced shortness of breath. Will continue to monitor  closely.  3. Type 2 diabetes mellitus with other specified complication, without long-term current use of insulin (HCC) Good blood sugar control is important to decrease the likelihood of diabetic complications such as nephropathy, neuropathy, limb loss, blindness, coronary artery disease, and death. Intensive lifestyle modification including diet, exercise and weight loss are the first line of treatment for diabetes. We will check labs today. Rokia will start her Category 2 meal plan.  - Vitamin B12 - Folate  4. Hypertension associated with type 2 diabetes mellitus ([place]) Zitlally will start her Category 2 meal plan, and will continue working on healthy weight loss and exercise to improve blood pressure control. We will watch for signs of hypotension as she continues her lifestyle modifications. Kimiye will continue her current anti-hypertensive therapy.  5. Hyperlipidemia associated with type 2 diabetes mellitus ([place]) Cardiovascular risk and specific lipid/LDL goals reviewed. We discussed several lifestyle modifications today. Belkis will start her Category 2 meal plan, and will continue to work on diet, exercise and weight loss efforts. We will check labs today. Orders and follow up as documented in patient record.   - Lipid Panel With LDL/HDL Ratio  6. Other depression with emotional eating Behavior modification techniques were discussed today to help Glenyce deal with her emotional/non-hunger eating behaviors. We will refer to Dr. Mallie Mussel, our Bariatric Psychologist for evaluation. Orders and follow up as documented in patient record.   7. At risk for activity intolerance Giavonni was given approximately 30 minutes of counseling today. She is 59 y.o. female and has risk factors exercise intolerance including obesity. We discussed intensive lifestyle modifications today with an emphasis on specific weight loss instructions and strategies. Marquesha will slowly increase activity as  tolerated.  Repetitive spaced learning was employed today to elicit superior memory formation and behavioral change.  8. Class 3 severe obesity with serious comorbidity and body mass index (BMI) of 45.0 to 49.9 in adult, unspecified obesity type (HCC) Georgianna is currently in the action stage of change and her goal is to continue with weight loss efforts. I recommend Mikeila begin the structured treatment plan as follows:  She has agreed to the Category 2 Plan.  Exercise goals: As is.   Behavioral modification strategies: increasing lean protein intake, decreasing simple carbohydrates, no skipping meals, meal planning and cooking strategies and celebration eating strategies.  She was informed of the importance of frequent follow-up visits to maximize her success with intensive lifestyle modifications for her multiple health conditions. She was informed we would discuss her lab results at her next visit unless there is a critical issue that needs to be addressed sooner. Danijah agreed to keep her next visit at the agreed upon time to discuss these results.  Objective:   Blood pressure 137/78, pulse 76, temperature 98.1 F (36.7 C), temperature source Oral, height 4\' 11"  (1.499 m), weight 226 lb (102.5 kg), SpO2 97 %. Body mass index is 45.65 kg/m.  EKG: Normal sinus rhythm, rate 75 BPM.  Indirect Calorimeter completed today shows a VO2 of 256 and a REE of 1779.  Her calculated basal metabolic rate is [id number] thus her basal metabolic rate is better than expected.  General: Cooperative, alert, well  developed, in no acute distress. HEENT: Conjunctivae and lids unremarkable. Cardiovascular: Regular rhythm.  Lungs: Normal work of breathing. Neurologic: No focal deficits.   Lab Results  Component Value Date   CREATININE 0.74 07/06/2019   BUN 24 (H) 07/06/2019   NA 136 07/06/2019   K 4.8 07/06/2019   CL 104 07/06/2019   CO2 24 07/06/2019   Lab Results  Component Value Date   ALT 28  07/06/2019   AST 24 07/06/2019   ALKPHOS 76 07/06/2019   BILITOT 0.5 07/06/2019   Lab Results  Component Value Date   HGBA1C 7.3 (H) 07/06/2019   HGBA1C 7.1 (H) 12/24/2018   HGBA1C 8.1 (A) 08/31/2018   HGBA1C 6.5 (H) 11/30/2017   HGBA1C 7.3 01/28/2017   No results found for: INSULIN Lab Results  Component Value Date   TSH 1.69 07/06/2019   Lab Results  Component Value Date   CHOL 185 12/24/2018   HDL 53.80 12/24/2018   LDLCALC 102 (H) 12/24/2018   TRIG 146.0 12/24/2018   CHOLHDL 3 12/24/2018   Lab Results  Component Value Date   WBC 13.4 (H) 07/06/2019   HGB 13.1 07/06/2019   HCT 39.5 07/06/2019   MCV 90.1 07/06/2019   PLT 304.0 07/06/2019   Lab Results  Component Value Date   IRON <10 (L) 10/18/2008   TIBC NOT CALC Not calculated due to Iron <10. 10/18/2008   FERRITIN 382 (H) 10/18/2008   Attestation Statements:   Reviewed by clinician on day of visit: allergies, medications, problem list, medical history, surgical history, family history, social history, and previous encounter notes.   I, Trixie Dredge, am acting as transcriptionist for Dennard Nip, MD.  I have reviewed the above documentation for accuracy and completeness, and I agree with the above. - Dennard Nip, MD

## 2019-07-07 NOTE — Progress Notes (Signed)
me

## 2019-07-07 NOTE — Progress Notes (Signed)
Office: [phone]  /  Fax: [phone]    Date: July 21, 2019   Appointment Start Time: 12:04pm Duration: 36 minutes Provider: Glennie Isle, Psy.D. Type of Session: Intake for Individual Therapy  Location of Patient: Home Location of Provider: Provider's Home Type of Contact: Telepsychological Visit via MyChart Video Visit  Informed Consent: This provider called Leah [name] at 12:02pm as she did not present for the telepsychological appointment. Directions on connecting were provided. As such, today's appointment was initiated 4 minutes late. Prior to proceeding with today's appointment, two pieces of identifying information were obtained. In addition, Leah [name]'s physical location at the time of this appointment was obtained as well a phone number she could be reached at in the event of technical difficulties. Leah [name] and this provider participated in today's telepsychological service.   The provider's role was explained to Leah [name]. The provider reviewed and discussed issues of confidentiality, privacy, and limits therein (e.g., reporting obligations). In addition to verbal informed consent, written informed consent for psychological services was obtained prior to the initial appointment. Since the clinic is not a 24/7 crisis center, mental health emergency resources were shared and this  provider explained MyChart, e-mail, voicemail, and/or other messaging systems should be utilized only for non-emergency reasons. This provider also explained that information obtained during appointments will be placed in [facility] record and relevant information will be shared with other providers at [facility] for coordination of care. Moreover, [name] agreed information may be shared with other [facility] providers as needed for coordination of care. By signing the service agreement document, Leah [name] provided written consent for coordination of care. Prior to initiating  telepsychological services, Leah [name] completed an informed consent document, which included the development of a safety plan (i.e., an emergency contact, nearest emergency room, and emergency resources) in the event of an emergency/crisis. Leah [name] expressed understanding of the rationale of the safety plan. Leah [name] verbally acknowledged understanding she is ultimately responsible for understanding her insurance benefits for telepsychological and in-person services. This provider also reviewed confidentiality, as it relates to telepsychological services, as well as the rationale for telepsychological services (i.e., to reduce exposure risk to COVID-19). Leah [name]  acknowledged understanding that appointments cannot be recorded without both party consent and she is aware she is responsible for securing confidentiality on her end of the session. Leah [name] verbally consented to proceed.  Chief Complaint/HPI: Leah [name] was referred by Leah [name] due to other depression, with emotional eating. Per the note for the initial visit with Leah [name] on Jul 07, 2019, "Leah [name]'s PHQ-9 score is 11. She is on venlafaxine 150 mg q daily." The note for the initial appointment with Leah [name] indicated the following: "Leah [name]'s habits were reviewed today and are as follows: Her family eats meals together, she thinks her family will eat healthier with her, she struggles with family and or coworkers weight loss sabotage, her desired weight loss is 31 lbs, she started gaining weight when her knee stopped working, her heaviest weight ever was 235 pounds, she frequently eats larger portions than normal and she struggles with emotional eating." Leah [name]'s Food and [name] (modified PHQ-9) score on Jul 07, 2019 was 11.  During today's appointment, Leah [name] described her husband as a "saboteur." Nevertheless, she noted, "My whole family is eating healthier." She was verbally administered a questionnaire assessing various behaviors related  to emotional eating. Leah [name] endorsed the following: find food is comforting to you. She explained she craves or wants certain foods  if she sees them or they are available. In addition, Leah [name] denied a history of binge eating. Leah [name] denied a history of restricting food intake, purging and engagement in other compensatory strategies, and has never been diagnosed with an eating disorder. She also denied a history of treatment for emotional eating. Furthermore, Leah [name] denied other problems of concern.    Mental Status Examination:  Appearance: well groomed and appropriate hygiene  Behavior: appropriate to circumstances [name]: euthymic Affect: [name] congruent Speech: normal in rate, volume, and tone Eye Contact: appropriate Psychomotor Activity: appropriate Gait: unable to assess Thought Process: linear, logical, and goal directed  Thought Content/Perception: denies suicidal and homicidal ideation, plan, and intent and no hallucinations, delusions, bizarre thinking or behavior reported or observed Orientation: time, person, place and purpose of appointment Memory/Concentration: memory, attention, language, and fund of knowledge intact  Insight/Judgment: good  Family & Psychosocial History: Leah [name] reported she is married and she has two adult sons and one adult step-son. She indicated she is currently not employed, noting she stopped working 10 years ago due to medical concerns. Additionally, Leah [name] shared her highest level of education obtained is an associate's degree. Currently, Leah [name]'s social support system consists of her husband, daughter in law, and sons. Moreover, Leah [name] stated she resides with her husband, two sons, sons' partners, and one grandson.   Medical History:  Past Medical History:  Diagnosis Date  . Allergic rhinitis   . Atrophic vaginitis   . Back pain   . Borderline diabetes 03/17/2013  . 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 Constipation   . Diabetes ([place])   . Dyspnea   . Fatty  liver   . Gallbladder problem   . GE reflux   . Hepatic encephalopathy ([place]) 2011  . History of PCR DNA positive for HSV1   . History of retinal vein occlusion, right eye   . HTN (hypertension) 2011  . Joint pain   . Liver problem   . Migraines   . Neuroendocrine tumor 2011  . Orthostatic hypotension   . Osteoarthritis   . Osteopenia   . Palpitations   . Sarcoidosis 2008  . Sleep apnea   . Surgical menopause    Past Surgical History:  Procedure Laterality Date  . ABDOMINAL HYSTERECTOMY  2004  . AUGMENTATION MAMMAPLASTY    . BREAST BIOPSY Left   . CESAREAN SECTION  [id number]  . CHOLECYSTECTOMY  2006  . HERNIA REPAIR    . KNEE ARTHROSCOPY Left 09/2010  . LAPAROSCOPIC HEPATECTOMY    . MASTECTOMY Bilateral 2008  . OOPHORECTOMY  2004  . RECONSTRUCTION BREAST W/ LATISSIMUS DORSI FLAP Bilateral   . TONSILLECTOMY  1976  . TUBAL LIGATION Bilateral 2001   Current Outpatient Medications on File Prior to Visit  Medication Sig Dispense Refill  . montelukast (SINGULAIR) 10 MG tablet TAKE 1 TABLET BY MOUTH EVERYDAY AT BEDTIME 90 tablet 0  . amLODipine (NORVASC) 10 MG tablet TAKE 1 TABLET BY MOUTH EVERY DAY 90 tablet 1  . aspirin 81 MG EC tablet TAKE 1 TABLET BY MOUTH EVERY DAY *SWALLOW WHOLE* 90 tablet 1  . fexofenadine (ALLEGRA) 180 MG tablet TAKE 1 TABLET BY MOUTH EVERY DAY 90 tablet 3  . gabapentin (NEURONTIN) 100 MG capsule TAKE 1 CAPSULE (100 MG TOTAL) BY MOUTH AT BEDTIME. 90 capsule 0  . metFORMIN (GLUCOPHAGE) 500 MG tablet Take 1 tablet (500 mg total) by mouth 2 (two) times daily with a meal. 180 tablet 3  . metoprolol succinate (TOPROL-XL) 100 MG 24  hr tablet TAKE 1 TABLET BY MOUTH EVERY EVENING WITH OR IMMEDIATELY FOLLOWING A MEAL 30 tablet 0  . naproxen (NAPROSYN) 500 MG tablet TAKE 1 TABLET TWICE A DAY WITH A MEAL 180 tablet 0  . octreotide (SANDOSTATIN LAR) 30 MG injection Inject 30 mg into the muscle every 28 (twenty-eight) days.    . ondansetron (ZOFRAN-ODT) 4 MG  disintegrating tablet TAKE 1 TABLET BY MOUTH EVERY 8 HOURS AS NEEDED FOR NAUSEA AND VOMITING 20 tablet 0  . Ospemifene (OSPHENA) 60 MG TABS Take 1 tablet by mouth daily. 90 tablet 3  . pantoprazole (PROTONIX) 40 MG tablet TAKE 1 TABLET BY MOUTH TWICE A DAY 180 tablet 0  . PROAIR HFA 108 (90 Base) MCG/ACT inhaler INHALE 2 PUFFS INTO THE LUNGS EVERY 6 (SIX) HOURS AS NEEDED. FOR SHORTNESS OF BREATH. (Patient taking differently: Inhale 2 puffs into the lungs every 6 (six) hours as needed for wheezing or shortness of breath. ) 6.7 Inhaler 4  . rosuvastatin (CRESTOR) 10 MG tablet Take 1 tablet (10 mg total) by mouth daily. 90 tablet 3  . valACYclovir (VALTREX) 500 MG tablet TAKE 1 TABLET BY MOUTH TWICE A DAY AS NEEDED FOR COLD SORES 180 tablet 1  . valsartan (DIOVAN) 320 MG tablet TAKE 1 TABLET BY MOUTH EVERY DAY 30 tablet 0  . venlafaxine XR (EFFEXOR-XR) 150 MG 24 hr capsule TAKE 1 CAPSULE (150 MG TOTAL) BY MOUTH EACH MORNING 90 capsule 1   No current facility-administered medications on file prior to visit.  Lysa denied a history of head injuries and loss of consciousness.    Mental Health History: Kellis reported she attended family therapy as one son was diagnosed with bipolar disorder and her other son was diagnosed with Asperger's approximately 15 years ago. Myha reported there is no history of hospitalizations for psychiatric concerns, and she has never met with a psychiatrist. Chasity stated she was prescribed Effexor by her PCP. Lamesha denied a family history of mental health related concerns. Quinnley reported there is no history of trauma including psychological, physical  and sexual abuse, as well as neglect. Furthermore, Margaretann stated her mother passed away when she was 43 and her father passed away when she was 67.   Leina described her typical [name] lately as "happy." Janelys denied current alcohol use. She denied tobacco use. She denied illicit/recreational substance use. Regarding caffeine  intake, Trude reported consuming 1/2 cup of coffee daily. Furthermore, Genoa indicated she is not experiencing the following: hallucinations and delusions, paranoia, symptoms of mania , social withdrawal, crying spells, panic attacks and decreased motivation. She also denied history of and current suicidal ideation, plan, and intent; history of and current homicidal ideation, plan, and intent; and history of and current engagement in self-harm.  The following strength was reported by Leah [name]: very outgoing. The following strengths were observed by this provider: ability to express thoughts and feelings during the therapeutic session, ability to establish and benefit from a therapeutic relationship, willingness to work toward established goal(s) with the clinic and ability to engage in reciprocal conversation.  Legal History: Anastasha reported there is no history of legal involvement.   Structured Assessments Results: The Patient Health Questionnaire-9 (PHQ-9) is a self-report measure that assesses symptoms and severity of depression over the course of the last two weeks. Shoshannah obtained a score of 0. [0= Not at all; 1= Several days; 2= More than half the days; 3= Nearly every day] Little interest or pleasure in doing things 0  Feeling down,  depressed, or hopeless 0  Trouble falling or staying asleep, or sleeping too much 0  Feeling tired or having little energy 0  Poor appetite or overeating 0  Feeling bad about yourself --- or that you are a failure or have let yourself or your family down 0  Trouble concentrating on things, such as reading the newspaper or watching television 0  Moving or speaking so slowly that other people could have noticed? Or the opposite --- being so fidgety or restless that you have been moving around a lot more than usual 0  Thoughts that you would be better off dead or hurting yourself in some way 0  PHQ-9 Score 0    The Generalized Anxiety Disorder-7 (GAD-7) is a brief  self-report measure that assesses symptoms of anxiety over the course of the last two weeks. Wyonia obtained a score of 0. [0= Not at all; 1= Several days; 2= Over half the days; 3= Nearly every day] Feeling nervous, anxious, on edge 0  Not being able to stop or control worrying 0  Worrying too much about different things 0  Trouble relaxing 0  Being so restless that it's hard to sit still 0  Becoming easily annoyed or irritable 0  Feeling afraid as if something awful might happen 0  GAD-7 Score 0   Interventions:  Conducted a chart review Focused on rapport building Verbally administered PHQ-9 and GAD-7 for symptom monitoring Verbally administered Food & [name] questionnaire to assess various behaviors related to emotional eating Provided emphatic reflections and validation Collaborated with patient on a treatment goal  Psychoeducation provided regarding physical versus emotional hunger  Provisional DSM-5 Diagnosis(es): 307.59 (F50.8) Other Specified Feeding or Eating Disorder, Emotional Eating Behaviors  Plan: Davinity appears able and willing to participate as evidenced by collaboration on a treatment goal, engagement in reciprocal conversation, and asking questions as needed for clarification. The next appointment will be scheduled in two weeks, which will be via MyChart Video Visit. The following treatment goal was established: increase coping skills. This provider will regularly review the treatment plan and medical chart to keep informed of status changes. Roisin expressed understanding and agreement with the initial treatment plan of care. Berna will be sent a handout via e-mail to utilize between now and the next appointment to increase awareness of hunger patterns and subsequent eating. Lashena provided verbal consent during today's appointment for this provider to send the handout via e-mail.

## 2019-07-08 LAB — LIPID PANEL WITH LDL/HDL RATIO
Cholesterol, Total: 245 mg/dL — ABNORMAL HIGH (ref 100–199)
HDL: 74 mg/dL (ref >39)
LDL Chol Calc (NIH): 154 mg/dL — ABNORMAL HIGH (ref 0–99)
LDL/HDL Ratio: 2.1 ratio (ref 0.0–3.2)
Triglycerides: 98 mg/dL (ref 0–149)
VLDL Cholesterol Cal: 17 mg/dL (ref 5–40)

## 2019-07-08 LAB — VITAMIN B12: Vitamin B-12: 327 pg/mL (ref 232–1245)

## 2019-07-08 LAB — FOLATE: Folate: 9.9 ng/mL (ref >3.0)

## 2019-07-09 ENCOUNTER — Other Ambulatory Visit: Payer: Self-pay | Admitting: Family Medicine

## 2019-07-09 DIAGNOSIS — J301 Allergic rhinitis due to pollen: Secondary | ICD-10-CM

## 2019-07-11 ENCOUNTER — Other Ambulatory Visit: Payer: Self-pay | Admitting: Family Medicine

## 2019-07-12 ENCOUNTER — Ambulatory Visit (INDEPENDENT_AMBULATORY_CARE_PROVIDER_SITE_OTHER): Payer: Self-pay | Admitting: Family Medicine

## 2019-07-12 ENCOUNTER — Other Ambulatory Visit: Payer: Self-pay

## 2019-07-14 ENCOUNTER — Other Ambulatory Visit: Payer: Self-pay

## 2019-07-14 ENCOUNTER — Encounter: Payer: Self-pay | Admitting: Family

## 2019-07-14 ENCOUNTER — Inpatient Hospital Stay: Payer: 59 | Attending: Hematology & Oncology

## 2019-07-14 ENCOUNTER — Inpatient Hospital Stay (HOSPITAL_BASED_OUTPATIENT_CLINIC_OR_DEPARTMENT_OTHER): Payer: 59 | Admitting: Family

## 2019-07-14 ENCOUNTER — Inpatient Hospital Stay: Payer: 59

## 2019-07-14 VITALS — HR 110

## 2019-07-14 VITALS — BP 150/97 | HR 115 | Temp 96.8°F | Resp 18 | Wt 230.0 lb

## 2019-07-14 DIAGNOSIS — C7A00 Malignant carcinoid tumor of unspecified site: Secondary | ICD-10-CM | POA: Diagnosis present

## 2019-07-14 DIAGNOSIS — C7B8 Other secondary neuroendocrine tumors: Secondary | ICD-10-CM

## 2019-07-14 DIAGNOSIS — R110 Nausea: Secondary | ICD-10-CM

## 2019-07-14 DIAGNOSIS — C7B02 Secondary carcinoid tumors of liver: Secondary | ICD-10-CM | POA: Insufficient documentation

## 2019-07-14 LAB — CBC WITH DIFFERENTIAL (CANCER CENTER ONLY)
Abs Immature Granulocytes: 0.06 10*3/uL (ref 0.00–0.07)
Basophils Absolute: 0.1 10*3/uL (ref 0.0–0.1)
Basophils Relative: 1 %
Eosinophils Absolute: 0.1 10*3/uL (ref 0.0–0.5)
Eosinophils Relative: 1 %
HCT: 41.5 % (ref 36.0–46.0)
Hemoglobin: 13.2 g/dL (ref 12.0–15.0)
Immature Granulocytes: 0 %
Lymphocytes Relative: 26 %
Lymphs Abs: 3.6 10*3/uL (ref 0.7–4.0)
MCH: 29.9 pg (ref 26.0–34.0)
MCHC: 31.8 g/dL (ref 30.0–36.0)
MCV: 94.1 fL (ref 80.0–100.0)
Monocytes Absolute: 0.9 10*3/uL (ref 0.1–1.0)
Monocytes Relative: 6 %
Neutro Abs: 9.2 10*3/uL — ABNORMAL HIGH (ref 1.7–7.7)
Neutrophils Relative %: 66 %
Platelet Count: 297 10*3/uL (ref 150–400)
RBC: 4.41 MIL/uL (ref 3.87–5.11)
RDW: 14.8 % (ref 11.5–15.5)
WBC Count: 13.9 10*3/uL — ABNORMAL HIGH (ref 4.0–10.5)
nRBC: 0 % (ref 0.0–0.2)

## 2019-07-14 LAB — CMP (CANCER CENTER ONLY)
ALT: 27 U/L (ref 0–44)
AST: 20 U/L (ref 15–41)
Albumin: 4 g/dL (ref 3.5–5.0)
Alkaline Phosphatase: 63 U/L (ref 38–126)
Anion gap: 11 (ref 5–15)
BUN: 22 mg/dL — ABNORMAL HIGH (ref 6–20)
CO2: 25 mmol/L (ref 22–32)
Calcium: 9.3 mg/dL (ref 8.9–10.3)
Chloride: 102 mmol/L (ref 98–111)
Creatinine: 0.88 mg/dL (ref 0.44–1.00)
GFR, Est AFR Am: >60 mL/min (ref >60)
GFR, Estimated: >60 mL/min (ref >60)
Glucose, Bld: 183 mg/dL — ABNORMAL HIGH (ref 70–99)
Potassium: 3.9 mmol/L (ref 3.5–5.1)
Sodium: 138 mmol/L (ref 135–145)
Total Bilirubin: 0.6 mg/dL (ref 0.3–1.2)
Total Protein: 6.9 g/dL (ref 6.5–8.1)

## 2019-07-14 LAB — LACTATE DEHYDROGENASE: LDH: 239 U/L — ABNORMAL HIGH (ref 98–192)

## 2019-07-14 MED ORDER — LANREOTIDE ACETATE 120 MG/0.5ML ~~LOC~~ SOLN
120.0000 mg | Freq: Once | SUBCUTANEOUS | Status: AC
  Administered 2019-07-14: 120 mg via SUBCUTANEOUS

## 2019-07-14 NOTE — Progress Notes (Signed)
Hematology and Oncology Follow Up Visit  Leah [name] ZB:[id number] 1960/11/10 59 y.o. 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 She is doing well and is enjoying spending time with her sweet grandson.  Her Chromogranin A level was down slightly at 94 last month!  No fever, chills, n/v, cough, rash, dizziness, SOB, chest pain, palpitations, abdominal pain or changes in bowel or bladder habits.  She has not noted and blood loss. No bruising or petechiae.  No episodes of bleeding. No bruising or petechiae.  No swelling, tenderness, numbness or tingling in her extremities.  No falls or syncope.  She has maintained a good appetite and is staying well hydrated. She is now going to a weight loss clinic and eating a healthy diet. Her weight is stable.   ECOG Performance Status: 1 - Symptomatic but completely ambulatory  Medications:  Allergies as of 07/14/2019   No Known Allergies     Medication List       Accurate as of July 14, 2019 10:58 AM. If you have any questions, ask your nurse or doctor.        amLODipine 10 MG tablet Commonly known as: NORVASC TAKE 1 TABLET BY MOUTH EVERY DAY   aspirin 81 MG EC tablet TAKE 1 TABLET BY MOUTH EVERY DAY *SWALLOW WHOLE*   fexofenadine 180 MG tablet Commonly known as: ALLEGRA TAKE 1 TABLET BY MOUTH EVERY DAY   gabapentin 100 MG capsule Commonly known as: NEURONTIN TAKE 1 CAPSULE (100 MG TOTAL) BY MOUTH AT BEDTIME.   metFORMIN 500 MG tablet Commonly known as: GLUCOPHAGE Take 1 tablet (500 mg total) by mouth 2 (two) times daily with a meal.   metoprolol succinate 100 MG 24 hr tablet Commonly known as: TOPROL-XL TAKE 1 TABLET BY MOUTH EVERY EVENING WITH OR IMMEDIATELY FOLLOWING A MEAL     montelukast 10 MG tablet Commonly known as: SINGULAIR TAKE 1 TABLET BY MOUTH EVERYDAY AT BEDTIME   naproxen 500 MG tablet Commonly known as: NAPROSYN TAKE 1 TABLET TWICE A DAY WITH A MEAL   octreotide 30 MG injection Commonly known as: SANDOSTATIN LAR Inject 30 mg into the muscle every 28 (twenty-eight) days.   ondansetron 4 MG disintegrating tablet Commonly known as: ZOFRAN-ODT TAKE 1 TABLET BY MOUTH EVERY 8 HOURS AS NEEDED FOR NAUSEA AND VOMITING   Osphena 60 MG Tabs Generic drug: Ospemifene Take 1 tablet by mouth daily.   pantoprazole 40 MG tablet Commonly known as: PROTONIX TAKE 1 TABLET BY MOUTH TWICE A DAY   ProAir HFA 108 (90 Base) MCG/ACT inhaler Generic drug: albuterol INHALE 2 PUFFS INTO THE LUNGS EVERY 6 (SIX) HOURS AS NEEDED. FOR SHORTNESS OF BREATH. What changed: See the new instructions.   rosuvastatin 10 MG tablet Commonly known as: Crestor Take 1 tablet (10 mg total) by mouth daily.   valACYclovir 500 MG tablet Commonly known as: VALTREX TAKE 1 TABLET BY MOUTH TWICE A DAY AS NEEDED FOR COLD SORES   valsartan 320 MG tablet Commonly known as: DIOVAN TAKE 1 TABLET BY MOUTH EVERY DAY   venlafaxine XR 150 MG 24 hr capsule Commonly known as: EFFEXOR-XR TAKE 1 CAPSULE (150 MG TOTAL) BY MOUTH EACH MORNING       Allergies: No Known Allergies  Past  Medical History, Surgical history, Social history, and Family History were reviewed and updated.  Review of Systems: All other 10 point review of systems is negative.   Physical Exam:  vitals were not taken for this visit.   Wt Readings from Last 3 Encounters:  07/07/19 226 lb (102.5 kg)  07/06/19 230 lb (104.3 kg)  06/16/19 235 lb (106.6 kg)    Ocular: Sclerae unicteric, pupils equal, round and reactive to light Ear-nose-throat: Oropharynx clear, dentition fair Lymphatic: No cervical or supraclavicular adenopathy Lungs no rales or rhonchi, good excursion bilaterally Heart regular rate and rhythm,  no murmur appreciated Abd soft, nontender, positive bowel sounds, no liver or spleen tip palpated on exam, no fluid wave  MSK no focal spinal tenderness, no joint edema Neuro: non-focal, well-oriented, appropriate affect Breasts: Deferred   Lab Results  Component Value Date   WBC 13.9 (H) 07/14/2019   HGB 13.2 07/14/2019   HCT 41.5 07/14/2019   MCV 94.1 07/14/2019   PLT 297 07/14/2019   Lab Results  Component Value Date   FERRITIN 382 (H) 10/18/2008   IRON <10 (L) 10/18/2008   TIBC NOT CALC Not calculated due to Iron <10. 10/18/2008   UIBC 156 10/18/2008   IRONPCTSAT NOT CALC Not calculated due to Iron <10. 10/18/2008   Lab Results  Component Value Date   RETICCTPCT 0.9 10/18/2008   RBC 4.41 07/14/2019   No results found for: KPAFRELGTCHN, LAMBDASER, KAPLAMBRATIO No results found for: Kandis Cocking, IGMSERUM No results found for: Kathrynn Ducking, MSPIKE, SPEI   Chemistry      Component Value Date/Time   NA 136 07/06/2019 0852   NA 140 01/20/2017 1416   NA 141 01/17/2016 1047   K 4.8 07/06/2019 0852   K 3.9 01/20/2017 1416   K 3.2 (L) 01/17/2016 1047   CL 104 07/06/2019 0852   CL 102 01/20/2017 1416   CO2 24 07/06/2019 0852   CO2 26 01/20/2017 1416   CO2 27 01/17/2016 1047   BUN 24 (H) 07/06/2019 0852   BUN 15 01/20/2017 1416   BUN 6.6 (L) 01/17/2016 1047   CREATININE 0.74 07/06/2019 0852   CREATININE 0.81 06/16/2019 1410   CREATININE 1.1 01/20/2017 1416   CREATININE 0.7 01/17/2016 1047      Component Value Date/Time   CALCIUM 9.0 07/06/2019 0852   CALCIUM 9.1 01/20/2017 1416   CALCIUM 8.7 01/17/2016 1047   ALKPHOS 76 07/06/2019 0852   ALKPHOS 84 01/20/2017 1416   ALKPHOS 90 01/17/2016 1047   AST 24 07/06/2019 0852   AST 20 06/16/2019 1410   AST 34 01/17/2016 1047   ALT 28 07/06/2019 0852   ALT 18 06/16/2019 1410   ALT 35 01/20/2017 1416   ALT 38 01/17/2016 1047   BILITOT 0.5 07/06/2019 0852   BILITOT 0.4  06/16/2019 1410   BILITOT 0.42 01/17/2016 1047       Impression and Plan: Leah [name] is a very pleasant 59 yo caucasian female withlong standing history ofmetastaticcarcinoidwith recurrent liver metastasis. We will proceed with Somatuline injection today as planned.  We continue to closely monitor her Chromogranin A closely and will increase her injection frequency if needed. She will be following up with [facility] this summer.  We will see her again in another month.  She will contact our office with any questions or concerns. We can certainly see her sooner if needed.   Laverna Peace, NP 6/3/202110:58 AM

## 2019-07-14 NOTE — Patient Instructions (Signed)
Lanreotide injection What is this medicine? LANREOTIDE (lan REE oh tide) is used to reduce blood levels of growth hormone in patients with a condition called acromegaly. It also works to slow or stop tumor growth in patients with neuroendocrine tumors and treat carcinoid syndrome. This medicine may be used for other purposes; ask your health care provider or pharmacist if you have questions. COMMON BRAND NAME(S): Somatuline [place] What should I tell my health care provider before I take this medicine? They need to know if you have any of these conditions:  diabetes  gallbladder disease  heart disease  kidney disease  liver disease  thyroid disease  an unusual or allergic reaction to lanreotide, other medicines, foods, dyes, or preservatives  pregnant or trying to get pregnant  breast-feeding How should I use this medicine? This medicine is for injection under the skin. It is given by a health care professional in a hospital or clinic setting. Contact your pediatrician or health care professional regarding the use of this medicine in children. Special care may be needed. Overdosage: If you think you have taken too much of this medicine contact a poison control center or emergency room at once. NOTE: This medicine is only for you. Do not share this medicine with others. What if I miss a dose? It is important not to miss your dose. Call your doctor or health care professional if you are unable to keep an appointment. What may interact with this medicine? This medicine may interact with the following medications:  bromocriptine  cyclosporine  certain medicines for blood pressure, heart disease, irregular heart beat  certain medicines for diabetes  quinidine  terfenadine This list may not describe all possible interactions. Give your health care provider a list of all the medicines, herbs, non-prescription drugs, or dietary supplements you use. Also tell them if you smoke,  drink alcohol, or use illegal drugs. Some items may interact with your medicine. What should I watch for while using this medicine? Tell your doctor or healthcare professional if your symptoms do not start to get better or if they get worse. Visit your doctor or health care professional for regular checks on your progress. Your condition will be monitored carefully while you are receiving this medicine. This medicine may increase blood sugar. Ask your healthcare provider if changes in diet or medicines are needed if you have diabetes. You may need blood work done while you are taking this medicine. Women should inform their doctor if they wish to become pregnant or think they might be pregnant. There is a potential for serious side effects to an unborn child. Talk to your health care professional or pharmacist for more information. Do not breast-feed an infant while taking this medicine or for 6 months after stopping it. This medicine has caused ovarian failure in some women. This medicine may interfere with the ability to have a child. Talk with your doctor or health care professional if you are concerned about your fertility. What side effects may I notice from receiving this medicine? Side effects that you should report to your doctor or health care professional as soon as possible:  allergic reactions like skin rash, itching or hives, swelling of the face, lips, or tongue  increased blood pressure  severe stomach pain  signs and symptoms of hgh blood sugar such as being more thirsty or hungry or having to urinate more than normal. You may also feel very tired or have blurry vision.  signs and symptoms of low blood   sugar such as feeling anxious; confusion; dizziness; increased hunger; unusually weak or tired; sweating; shakiness; cold; irritable; headache; blurred vision; fast heartbeat; loss of consciousness  unusually slow heartbeat Side effects that usually do not require medical  attention (report to your doctor or health care professional if they continue or are bothersome):  constipation  diarrhea  dizziness  headache  muscle pain  muscle spasms  nausea  pain, redness, or irritation at site where injected This list may not describe all possible side effects. Call your doctor for medical advice about side effects. You may report side effects to FDA at 1-800-FDA-1088. Where should I keep my medicine? This drug is given in a hospital or clinic and will not be stored at home. NOTE: This sheet is a summary. It may not cover all possible information. If you have questions about this medicine, talk to your doctor, pharmacist, or health care provider.  2020 [employer]vier/Gold Standard (2017-11-05 09:13:08)  

## 2019-07-15 LAB — CHROMOGRANIN A: Chromogranin A (ng/mL): 106.4 ng/mL — ABNORMAL HIGH (ref 0.0–101.8)

## 2019-07-21 ENCOUNTER — Other Ambulatory Visit: Payer: Self-pay

## 2019-07-21 ENCOUNTER — Encounter (INDEPENDENT_AMBULATORY_CARE_PROVIDER_SITE_OTHER): Payer: Self-pay | Admitting: Family Medicine

## 2019-07-21 ENCOUNTER — Telehealth (INDEPENDENT_AMBULATORY_CARE_PROVIDER_SITE_OTHER): Payer: 59 | Admitting: Psychology

## 2019-07-21 ENCOUNTER — Ambulatory Visit (INDEPENDENT_AMBULATORY_CARE_PROVIDER_SITE_OTHER): Payer: 59 | Admitting: Family Medicine

## 2019-07-21 VITALS — BP 127/68 | HR 126 | Temp 98.8°F | Ht 59.0 in | Wt 224.0 lb

## 2019-07-21 DIAGNOSIS — Z6841 Body Mass Index (BMI) 40.0 and over, adult: Secondary | ICD-10-CM | POA: Diagnosis not present

## 2019-07-21 DIAGNOSIS — E7849 Other hyperlipidemia: Secondary | ICD-10-CM

## 2019-07-21 DIAGNOSIS — F5089 Other specified eating disorder: Secondary | ICD-10-CM | POA: Diagnosis not present

## 2019-07-21 DIAGNOSIS — E1169 Type 2 diabetes mellitus with other specified complication: Secondary | ICD-10-CM

## 2019-07-21 NOTE — Progress Notes (Signed)
Chief Complaint:   OBESITY Leah [name] is here to discuss her progress with her obesity treatment plan along with follow-up of her obesity related diagnoses. Leah [name] is on the Category 2 Plan and states she is following her eating plan approximately 80% of the time. Leah [name] states she is doing 0 minutes 0 times per week.  Today's visit was #: 2 Starting weight: 226 lbs Starting date: 07/07/2019 Today's weight: 224 lbs Today's date: 07/21/2019 Total lbs lost to date: 2 Total lbs lost since last in-office visit: 2  Interim History: Leah [name] is with multiple questions regarding healthier options. She continues to lose weight and doing well with her nutritional goals and weight loss. She is struggling some with carbohydrates and sticking the plan at times (eating pizza, etc).  Subjective:   1. Type 2 diabetes mellitus with other specified complication, without long-term current use of insulin ([place]) Leah [name] diabetes mellitus is poorly controlled with fasting BGs recently at 183. Her A1c is 7.3 from our recent labs.  2. Other hyperlipidemia Leah [name]'s LDL was recently at 154. She was recently started on Crestor by her primary care physician. She is tolerating it well and denies concerns or complaints.  Assessment/Plan:   1. Type 2 diabetes mellitus with other specified complication, without long-term current use of insulin (HCC) Good blood sugar control is important to decrease the likelihood of diabetic complications such as nephropathy, neuropathy, limb loss, blindness, coronary artery disease, and death. Intensive lifestyle modification including diet, exercise and weight loss are the first line of treatment for diabetes. Leah [name] will continue diet and nutrition plan, and weight loss. If no significant improvement of A1c in 3 months, we will increase metformin. We will continue to monitor alongside her primary care physician. Goal A1c is <7.0.  2. Other hyperlipidemia Cardiovascular risk and  specific lipid/LDL goals reviewed. We discussed several lifestyle modifications today. Leah [name] will continue Crestor per her primary care physician qhs. She will continue diet and nutrition plan. We will continue to monitor alongside her primary care physician. As her LDL is not at goal currently. Orders and follow up as documented in patient record.   3. Class 3 severe obesity with serious comorbidity and body mass index (BMI) of 45.0 to 49.9 in adult, unspecified obesity type (HCC) Leah [name] is currently in the action stage of change. As such, her goal is to continue with weight loss efforts. She has agreed to the Category 2 Plan with lunch options.   Exercise goals: All adults should avoid inactivity. Some physical activity is better than none, and adults who participate in any amount of physical activity gain some health benefits.  Behavioral modification strategies: meal planning and cooking strategies and avoiding temptations.  Leah [name] has agreed to follow-up with our clinic in 2 weeks. She was informed of the importance of frequent follow-up visits to maximize her success with intensive lifestyle modifications for her multiple health conditions.   Objective:   Blood pressure 127/68, pulse (!) 126, temperature 98.8 F (37.1 C), temperature source Oral, height 4\' 11"  (1.499 m), weight 224 lb (101.6 kg), SpO2 98 %. Body mass index is 45.24 kg/m.  General: Cooperative, alert, well developed, in no acute distress. HEENT: Conjunctivae and lids unremarkable. Cardiovascular: Regular rhythm.  Lungs: Normal work of breathing. Neurologic: No focal deficits.   Lab Results  Component Value Date   CREATININE 0.88 07/14/2019   BUN 22 (H) 07/14/2019   NA 138 07/14/2019   K 3.9 07/14/2019   CL 102 07/14/2019  CO2 25 07/14/2019   Lab Results  Component Value Date   ALT 27 07/14/2019   AST 20 07/14/2019   ALKPHOS 63 07/14/2019   BILITOT 0.6 07/14/2019   Lab Results  Component Value Date    HGBA1C 7.3 (H) 07/06/2019   HGBA1C 7.1 (H) 12/24/2018   HGBA1C 8.1 (A) 08/31/2018   HGBA1C 6.5 (H) 11/30/2017   HGBA1C 7.3 01/28/2017   No results found for: INSULIN Lab Results  Component Value Date   TSH 1.69 07/06/2019   Lab Results  Component Value Date   CHOL 245 (H) 07/07/2019   HDL 74 07/07/2019   LDLCALC 154 (H) 07/07/2019   TRIG 98 07/07/2019   CHOLHDL 3 12/24/2018   Lab Results  Component Value Date   WBC 13.9 (H) 07/14/2019   HGB 13.2 07/14/2019   HCT 41.5 07/14/2019   MCV 94.1 07/14/2019   PLT 297 07/14/2019   Lab Results  Component Value Date   IRON <10 (L) 10/18/2008   TIBC NOT CALC Not calculated due to Iron <10. 10/18/2008   FERRITIN 382 (H) 10/18/2008   Attestation Statements:   Reviewed by clinician on day of visit: allergies, medications, problem list, medical history, surgical history, family history, social history, and previous encounter notes.  Time spent on visit including pre-visit chart review and post-visit care and charting was 40 minutes.    I, Trixie Dredge, am acting as transcriptionist for Dennard Nip, MD.  I have reviewed the above documentation for accuracy and completeness, and I agree with the above. -  Dennard Nip, MD

## 2019-07-25 ENCOUNTER — Other Ambulatory Visit: Payer: Self-pay | Admitting: Family Medicine

## 2019-07-26 ENCOUNTER — Encounter: Payer: Self-pay | Admitting: Family

## 2019-07-29 ENCOUNTER — Other Ambulatory Visit: Payer: Self-pay

## 2019-07-29 DIAGNOSIS — F419 Anxiety disorder, unspecified: Secondary | ICD-10-CM

## 2019-07-29 MED ORDER — VENLAFAXINE HCL ER 150 MG PO CP24: ORAL_CAPSULE | ORAL | 0 refills | Status: DC

## 2019-08-04 ENCOUNTER — Other Ambulatory Visit: Payer: Self-pay

## 2019-08-04 ENCOUNTER — Ambulatory Visit (INDEPENDENT_AMBULATORY_CARE_PROVIDER_SITE_OTHER): Payer: 59 | Admitting: Family Medicine

## 2019-08-04 ENCOUNTER — Telehealth (INDEPENDENT_AMBULATORY_CARE_PROVIDER_SITE_OTHER): Payer: 59 | Admitting: Psychology

## 2019-08-04 ENCOUNTER — Other Ambulatory Visit: Payer: Self-pay | Admitting: Family Medicine

## 2019-08-04 ENCOUNTER — Encounter (INDEPENDENT_AMBULATORY_CARE_PROVIDER_SITE_OTHER): Payer: Self-pay | Admitting: Family Medicine

## 2019-08-04 VITALS — BP 121/79 | HR 81 | Temp 98.7°F | Ht 59.0 in | Wt 228.0 lb

## 2019-08-04 DIAGNOSIS — I152 Hypertension secondary to endocrine disorders: Secondary | ICD-10-CM

## 2019-08-04 DIAGNOSIS — E1159 Type 2 diabetes mellitus with other circulatory complications: Secondary | ICD-10-CM

## 2019-08-04 DIAGNOSIS — Z6841 Body Mass Index (BMI) 40.0 and over, adult: Secondary | ICD-10-CM

## 2019-08-04 DIAGNOSIS — F5089 Other specified eating disorder: Secondary | ICD-10-CM

## 2019-08-04 DIAGNOSIS — E119 Type 2 diabetes mellitus without complications: Secondary | ICD-10-CM | POA: Diagnosis not present

## 2019-08-04 DIAGNOSIS — I10 Essential (primary) hypertension: Secondary | ICD-10-CM

## 2019-08-04 NOTE — Progress Notes (Signed)
  Office: [phone]  /  Fax: [phone]    Date: August 04, 2019    Appointment Start Time: 10:28am Duration: 26 minutes Provider: Glennie Isle, Psy.D. Type of Session: Individual Therapy  Location of Patient: Home Location of Provider: Provider's Home Type of Contact: Telepsychological Visit via MyChart Video Visit  Session Content: Patches is a 59 y.o. female presenting via [place] Visit for a follow-up appointment to address the previously established treatment goal of increasing coping skills. Today's appointment was a telepsychological visit due to COVID-19. [name] provided verbal consent for today's telepsychological appointment and she is aware she is responsible for securing confidentiality on her end of the session. Prior to proceeding with today's appointment, Girtha's physical location at the time of this appointment was obtained as well a phone number she could be reached at in the event of technical difficulties. Saphyra and this provider participated in today's telepsychological service.   This provider conducted a brief check-in. Kelda shared about a recent vacation, noting deviations from her structured meal plan. Aside from vacation, she noted, "Things have been good." She stated she is back on track as of this morning. Additionally, emotional and physical hunger were reviewed. Moreover, psychoeducation regarding triggers for emotional eating was provided. Lanisa was provided a handout, and encouraged to utilize the handout between now and the next appointment to increase awareness of triggers and frequency. Layah agreed. This provider also discussed behavioral strategies for specific triggers, such as placing the utensil down when conversing to avoid mindless eating. Elizabeht provided verbal consent during today's appointment for this provider to send a handout about triggers via e-mail. Kinza was receptive to today's appointment as evidenced by openness to sharing,  responsiveness to feedback, and willingness to explore triggers for emotional eating.  Mental Status Examination:  Appearance: well groomed and appropriate hygiene  Behavior: appropriate to circumstances Mood: euthymic Affect: mood congruent Speech: normal in rate, volume, and tone Eye Contact: appropriate Psychomotor Activity: appropriate Gait: unable to assess Thought Process: linear, logical, and goal directed  Thought Content/Perception: no hallucinations, delusions, bizarre thinking or behavior reported or observed and no evidence of suicidal and homicidal ideation, plan, and intent Orientation: time, person, place, and purpose of appointment Memory/Concentration: memory, attention, language, and fund of knowledge intact  Insight/Judgment: good  Interventions:  Conducted a brief chart review Provided empathic reflections and validation Reviewed content from the previous session Employed supportive psychotherapy interventions to facilitate reduced distress and to improve coping skills with identified stressors Employed motivational interviewing skills to assess patient's willingness/desire to adhere to recommended medical treatments and assignments Psychoeducation provided regarding triggers for emotional eating  DSM-5 Diagnosis(es): 307.59 (F50.8) Other Specified Feeding or Eating Disorder, Emotional Eating Behaviors  Treatment Goal & Progress: During the initial appointment with this provider, the following treatment goal was established: increase coping skills. Progress is limited, as Landry has just begun treatment with this provider; however, she is receptive to the interaction and interventions and rapport is being established.   Plan: Based on recent progress, frequency of future appointments will be reduced. As such, the next appointment will be scheduled in three weeks, which will be via MyChart Video Visit. The next session will focus on working towards the established  treatment goal.

## 2019-08-04 NOTE — Progress Notes (Signed)
Chief Complaint:   OBESITY Leah [name] is here to discuss her progress with her obesity treatment plan along with follow-up of her obesity related diagnoses. Leah [name] is on the Category 2 Plan and states she is following her eating plan approximately 70% of the time. Leah [name] states she is walking for exercise.  Today's visit was #: 3 Starting weight: 226 lbs Starting date: 07/07/2019 Today's weight: 228 lbs Today's date: 08/08/2019 Total lbs lost to date: 0 Total lbs lost since last in-office visit: 0  Interim History: Leah [name] was at [employer] recently.  She says she ate lots of carbs, fried tacos, donuts, bagels.  There was one time that she did not choose "all you can eat options, and pushed water one day".  No upcoming vacations.  She did not choose candy to snack on.  Aside from vacation, she likes the plan and tolerates it well.  She says her cravings are under good control.  Denies hunger.  Subjective:   1. Diabetes mellitus without complication ([place]) Blood sugars - she did not check them.  She did not have symptoms or concerns.  She is on metformin with no GI side effects.  Lab Results  Component Value Date   HGBA1C 7.3 (H) 07/06/2019   HGBA1C 7.1 (H) 12/24/2018   HGBA1C 8.1 (A) 08/31/2018   Lab Results  Component Value Date   MICROALBUR 0.9 07/06/2019   LDLCALC 154 (H) 07/07/2019   CREATININE 0.88 07/14/2019   2. Hypertension associated with type 2 diabetes mellitus ([place]) Blood pressure today is within normal limits.  At goal.  At home, well-controlled and around what it is today.  BP Readings from Last 3 Encounters:  08/04/19 121/79  07/21/19 127/68  07/14/19 (!) 150/97   Assessment/Plan:   1. Diabetes mellitus without complication ([place]) Good blood sugar control is important to decrease the likelihood of diabetic complications such as nephropathy, neuropathy, limb loss, blindness, coronary artery disease, and death. Intensive lifestyle modification including  diet, exercise and weight loss are the first line of treatment for diabetes.  Needs A1c mid-end of August.  Continue current medications.  Will continue to monitor alongside PCP.  2. Hypertension associated with type 2 diabetes mellitus ([place]) Blood pressure is at goal.  Continue home blood pressure monitoring and keep a log.  Continue medications.  Close monitoring.  3. Class 3 severe obesity with serious comorbidity and body mass index (BMI) of 45.0 to 49.9 in adult, unspecified obesity type (HCC) Leah [name] is currently in the action stage of change. As such, her goal is to continue with weight loss efforts. She has agreed to the Category 2 Plan.   Exercise goals: As is.  Behavioral modification strategies: increasing lean protein intake, decreasing simple carbohydrates, increasing water intake, celebration eating strategies and planning for success. Leah [name]'s goal for next office visit is to stay on track.  She is only to grocery shop from her online lists.    She had previously been seeing Dr. Juleen China, and her daughter goes to her now (daughter lives with her).  Leah [name] will plan to follow-up with Dr. Juleen China in future appointments.   Leah [name] has agreed to follow-up with our clinic in 2 weeks. She was informed of the importance of frequent follow-up visits to maximize her success with intensive lifestyle modifications for her multiple health conditions.   Objective:   Blood pressure 121/79, pulse 81, temperature 98.7 F (37.1 C), temperature source Oral, height 4\' 11"  (1.499 m), weight 228 lb (103.4  kg), SpO2 97 %. Body mass index is 46.05 kg/m.  General: Cooperative, alert, well developed, in no acute distress. HEENT: Conjunctivae and lids unremarkable. Cardiovascular: Regular rhythm.  Lungs: Normal work of breathing. Neurologic: No focal deficits.   Lab Results  Component Value Date   CREATININE 0.88 07/14/2019   BUN 22 (H) 07/14/2019   NA 138 07/14/2019   K 3.9 07/14/2019   CL  102 07/14/2019   CO2 25 07/14/2019   Lab Results  Component Value Date   ALT 27 07/14/2019   AST 20 07/14/2019   ALKPHOS 63 07/14/2019   BILITOT 0.6 07/14/2019   Lab Results  Component Value Date   HGBA1C 7.3 (H) 07/06/2019   HGBA1C 7.1 (H) 12/24/2018   HGBA1C 8.1 (A) 08/31/2018   HGBA1C 6.5 (H) 11/30/2017   HGBA1C 7.3 01/28/2017   No results found for: INSULIN Lab Results  Component Value Date   TSH 1.69 07/06/2019   Lab Results  Component Value Date   CHOL 245 (H) 07/07/2019   HDL 74 07/07/2019   LDLCALC 154 (H) 07/07/2019   TRIG 98 07/07/2019   CHOLHDL 3 12/24/2018   Lab Results  Component Value Date   WBC 13.9 (H) 07/14/2019   HGB 13.2 07/14/2019   HCT 41.5 07/14/2019   MCV 94.1 07/14/2019   PLT 297 07/14/2019   Lab Results  Component Value Date   IRON <10 (L) 10/18/2008   TIBC NOT CALC Not calculated due to Iron <10. 10/18/2008   FERRITIN 382 (H) 10/18/2008   Attestation Statements:   Reviewed by clinician on day of visit: allergies, medications, problem list, medical history, surgical history, family history, social history, and previous encounter notes.  Time spent on visit including pre-visit chart review and post-visit care and charting was 28 minutes.   I, Water quality scientist, CMA, am acting as Location manager for [employer], DO.  I have reviewed the above documentation for accuracy and completeness, and I agree with the above. Mellody Dance, DO

## 2019-08-11 NOTE — Progress Notes (Signed)
  Office: [phone]  /  Fax: [phone]    Date: August 25, 2019   Appointment Start Time: 10:30am Duration: 21 minutes Provider: Glennie Isle, Psy.D. Type of Session: Individual Therapy  Location of Patient: Home Location of Provider: Provider's Home Type of Contact: Telepsychological Visit via MyChart Video Visit  Session Content: Leah [name] is a 59 y.o. female presenting via [place] Visit for a follow-up appointment to address the previously established treatment goal of increasing coping skills. Today's appointment was a telepsychological visit due to COVID-19. Leah [name] provided verbal consent for today's telepsychological appointment and she is aware she is responsible for securing confidentiality on her end of the session. Prior to proceeding with today's appointment, Leah [name] physical location at the time of this appointment was obtained as well a phone number she could be reached at in the event of technical difficulties. Leah [name] and this provider participated in today's telepsychological service.   This provider conducted a brief check-in. Leah [name] stated her grandson scratched her cornea. Regarding eating, she stated she continues to focus on making better choices and engaging in portion control. Triggers for emotional eating were reviewed. Psychoeducation regarding mindfulness was provided. A handout was provided to Leah [name] with further information regarding mindfulness, including exercises. This provider also explained the benefit of mindfulness as it relates to emotional eating. Leah [name] was encouraged to engage in the provided exercises between now and the next appointment with this provider. Leah [name] agreed. During today's appointment, Leah [name] was led through a mindfulness exercise involving her senses. Leah [name] provided verbal consent during today's appointment for this provider to send a handout about mindfulness via e-mail. Leah [name] was receptive to today's appointment as evidenced by  openness to sharing, responsiveness to feedback, and willingness to engage in mindfulness exercises to assist with coping.  Mental Status Examination:  Appearance: well groomed and appropriate hygiene  Behavior: appropriate to circumstances [name]: euthymic Affect: [name] congruent Speech: normal in rate, volume, and tone Eye Contact: appropriate Psychomotor Activity: appropriate Gait: unable to assess Thought Process: linear, logical, and goal directed  Thought Content/Perception: no hallucinations, delusions, bizarre thinking or behavior reported or observed and no evidence of suicidal and homicidal ideation, plan, and intent Orientation: time, person, place, and purpose of appointment Memory/Concentration: memory, attention, language, and fund of knowledge intact  Insight/Judgment: good  Interventions:  Conducted a brief chart review Provided empathic reflections and validation Reviewed content from the previous session Employed supportive psychotherapy interventions to facilitate reduced distress and to improve coping skills with identified stressors Psychoeducation provided regarding mindfulness Engaged patient in mindfulness exercise(s) Employed acceptance and commitment interventions to emphasize mindfulness and acceptance without struggle  DSM-5 Diagnosis(es): 307.59 (F50.8) Other Specified Feeding or Eating Disorder, Emotional Eating Behaviors  Treatment Goal & Progress: During the initial appointment with this provider, the following treatment goal was established: increase coping skills. Leah [name] has demonstrated progress in her goal as evidenced by increased awareness of hunger patterns and increased awareness of triggers for emotional eating. Leah [name] also demonstrates willingness to engage in mindfulness exercises.  Plan: The next appointment will be scheduled in approximately two weeks, which will be via MyChart Video Visit. The next session will focus on working towards the  established treatment goal.

## 2019-08-12 ENCOUNTER — Inpatient Hospital Stay (HOSPITAL_BASED_OUTPATIENT_CLINIC_OR_DEPARTMENT_OTHER): Payer: 59 | Admitting: Family

## 2019-08-12 ENCOUNTER — Inpatient Hospital Stay: Payer: 59 | Attending: Hematology & Oncology

## 2019-08-12 ENCOUNTER — Other Ambulatory Visit: Payer: Self-pay

## 2019-08-12 ENCOUNTER — Inpatient Hospital Stay: Payer: 59

## 2019-08-12 ENCOUNTER — Encounter: Payer: Self-pay | Admitting: Family

## 2019-08-12 VITALS — BP 147/60 | HR 75 | Temp 98.4°F

## 2019-08-12 VITALS — Wt 228.0 lb

## 2019-08-12 DIAGNOSIS — C7A00 Malignant carcinoid tumor of unspecified site: Secondary | ICD-10-CM | POA: Insufficient documentation

## 2019-08-12 DIAGNOSIS — C7B8 Other secondary neuroendocrine tumors: Secondary | ICD-10-CM | POA: Diagnosis not present

## 2019-08-12 DIAGNOSIS — C7B02 Secondary carcinoid tumors of liver: Secondary | ICD-10-CM | POA: Insufficient documentation

## 2019-08-12 LAB — CMP (CANCER CENTER ONLY)
ALT: 21 U/L (ref 0–44)
AST: 18 U/L (ref 15–41)
Albumin: 4 g/dL (ref 3.5–5.0)
Alkaline Phosphatase: 62 U/L (ref 38–126)
Anion gap: 7 (ref 5–15)
BUN: 20 mg/dL (ref 6–20)
CO2: 28 mmol/L (ref 22–32)
Calcium: 9.1 mg/dL (ref 8.9–10.3)
Chloride: 106 mmol/L (ref 98–111)
Creatinine: 0.84 mg/dL (ref 0.44–1.00)
GFR, Est AFR Am: >60 mL/min (ref >60)
GFR, Estimated: >60 mL/min (ref >60)
Glucose, Bld: 140 mg/dL — ABNORMAL HIGH (ref 70–99)
Potassium: 4.2 mmol/L (ref 3.5–5.1)
Sodium: 141 mmol/L (ref 135–145)
Total Bilirubin: 0.5 mg/dL (ref 0.3–1.2)
Total Protein: 6.9 g/dL (ref 6.5–8.1)

## 2019-08-12 LAB — CBC WITH DIFFERENTIAL (CANCER CENTER ONLY)
Abs Immature Granulocytes: 0.08 10*3/uL — ABNORMAL HIGH (ref 0.00–0.07)
Basophils Absolute: 0 10*3/uL (ref 0.0–0.1)
Basophils Relative: 0 %
Eosinophils Absolute: 0.1 10*3/uL (ref 0.0–0.5)
Eosinophils Relative: 1 %
HCT: 40 % (ref 36.0–46.0)
Hemoglobin: 13 g/dL (ref 12.0–15.0)
Immature Granulocytes: 1 %
Lymphocytes Relative: 26 %
Lymphs Abs: 3.3 10*3/uL (ref 0.7–4.0)
MCH: 30.4 pg (ref 26.0–34.0)
MCHC: 32.5 g/dL (ref 30.0–36.0)
MCV: 93.5 fL (ref 80.0–100.0)
Monocytes Absolute: 0.8 10*3/uL (ref 0.1–1.0)
Monocytes Relative: 7 %
Neutro Abs: 8.3 10*3/uL — ABNORMAL HIGH (ref 1.7–7.7)
Neutrophils Relative %: 65 %
Platelet Count: 264 10*3/uL (ref 150–400)
RBC: 4.28 MIL/uL (ref 3.87–5.11)
RDW: 15.4 % (ref 11.5–15.5)
WBC Count: 12.7 10*3/uL — ABNORMAL HIGH (ref 4.0–10.5)
nRBC: 0 % (ref 0.0–0.2)

## 2019-08-12 MED ORDER — LANREOTIDE ACETATE 120 MG/0.5ML ~~LOC~~ SOLN
120.0000 mg | Freq: Once | SUBCUTANEOUS | Status: AC
  Administered 2019-08-12: 120 mg via SUBCUTANEOUS

## 2019-08-12 NOTE — Progress Notes (Signed)
Hematology and Oncology Follow Up Visit  Leah [name] [id number] November 01, 1960 59 y.o. 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 She is doing well and has no complaints at this time.  She is staying busy with her sweet family and helping out friends.  She is pleased with her 7 lb weight loss over the last 5 weeks. She is hoping to het down to 190 lbs so she can have her left knee replacement.  Last Chromogranin A level was 106 last month. Today's result is pending.  No fever, chills, n/v, cough, rash, dizziness, SOB, chest pain, palpitations, abdominal pain or changes in bowel or bladder habits.  No bleeding, bruising or petechiae.  No swelling, tenderness, numbness or tingling in her extremities.  No falls or syncope.  She has maintained a good appetite and is staying well hydrated. Her weight is stable.   ECOG Performance Status: 1 - Symptomatic but completely ambulatory  Medications:  Allergies as of 08/12/2019   No Known Allergies     Medication List       Accurate as of August 12, 2019  3:46 PM. If you have any questions, ask your nurse or doctor.        amLODipine 10 MG tablet Commonly known as: NORVASC TAKE 1 TABLET BY MOUTH EVERY DAY   aspirin 81 MG EC tablet TAKE 1 TABLET BY MOUTH EVERY DAY *SWALLOW WHOLE*   fexofenadine 180 MG tablet Commonly known as: ALLEGRA TAKE 1 TABLET BY MOUTH EVERY DAY   gabapentin 100 MG capsule Commonly known as: NEURONTIN TAKE 1 CAPSULE (100 MG TOTAL) BY MOUTH AT BEDTIME.   metFORMIN 500 MG tablet Commonly known as: GLUCOPHAGE Take 1 tablet (500 mg total) by mouth 2 (two) times daily with a meal.   metoprolol succinate 100 MG 24 hr tablet Commonly known as: TOPROL-XL TAKE 1 TABLET BY  MOUTH EVERY EVENING WITH OR IMMEDIATELY FOLLOWING A MEAL   montelukast 10 MG tablet Commonly known as: SINGULAIR TAKE 1 TABLET BY MOUTH EVERYDAY AT BEDTIME   naproxen 500 MG tablet Commonly known as: NAPROSYN TAKE 1 TABLET TWICE A DAY WITH A MEAL   octreotide 30 MG injection Commonly known as: SANDOSTATIN LAR Inject 30 mg into the muscle every 28 (twenty-eight) days.   ondansetron 4 MG disintegrating tablet Commonly known as: ZOFRAN-ODT TAKE 1 TABLET BY MOUTH EVERY 8 HOURS AS NEEDED FOR NAUSEA AND VOMITING   Osphena 60 MG Tabs Generic drug: Ospemifene Take 1 tablet by mouth daily.   pantoprazole 40 MG tablet Commonly known as: PROTONIX TAKE 1 TABLET BY MOUTH TWICE A DAY   ProAir HFA 108 (90 Base) MCG/ACT inhaler Generic drug: albuterol INHALE 2 PUFFS INTO THE LUNGS EVERY 6 (SIX) HOURS AS NEEDED. FOR SHORTNESS OF BREATH. What changed: See the new instructions.   rosuvastatin 10 MG tablet Commonly known as: Crestor Take 1 tablet (10 mg total) by mouth daily.   valACYclovir 500 MG tablet Commonly known as: VALTREX TAKE 1 TABLET BY MOUTH TWICE A DAY AS NEEDED FOR COLD SORES   valsartan 320 MG tablet Commonly known as: DIOVAN TAKE 1 TABLET BY MOUTH EVERY DAY   venlafaxine XR 150 MG 24 hr capsule Commonly known as: EFFEXOR-XR TAKE 1 CAPSULE (150 MG TOTAL) BY MOUTH EACH MORNING  Allergies: No Known Allergies  Past Medical History, Surgical history, Social history, and Family History were reviewed and updated.  Review of Systems: All other 10 point review of systems is negative.   Physical Exam:  oral temperature is 98.4 F (36.9 C). Her blood pressure is 147/60 (abnormal) and her pulse is 75. Her oxygen saturation is 99%.   Wt Readings from Last 3 Encounters:  08/12/19 228 lb (103.4 kg)  08/04/19 228 lb (103.4 kg)  07/21/19 224 lb (101.6 kg)    Ocular: Sclerae unicteric, pupils equal, round and reactive to light Ear-nose-throat: Oropharynx clear,  dentition fair Lymphatic: No cervical or supraclavicular adenopathy Lungs no rales or rhonchi, good excursion bilaterally Heart regular rate and rhythm, no murmur appreciated Abd soft, nontender, positive bowel sounds, no liver or spleen tip palpated on exam, no fluid wave  MSK no focal spinal tenderness, no joint edema Neuro: non-focal, well-oriented, appropriate affect Breasts: Deferred   Lab Results  Component Value Date   WBC 12.7 (H) 08/12/2019   HGB 13.0 08/12/2019   HCT 40.0 08/12/2019   MCV 93.5 08/12/2019   PLT 264 08/12/2019   Lab Results  Component Value Date   FERRITIN 382 (H) 10/18/2008   IRON <10 (L) 10/18/2008   TIBC NOT CALC Not calculated due to Iron <10. 10/18/2008   UIBC 156 10/18/2008   IRONPCTSAT NOT CALC Not calculated due to Iron <10. 10/18/2008   Lab Results  Component Value Date   RETICCTPCT 0.9 10/18/2008   RBC 4.28 08/12/2019   No results found for: KPAFRELGTCHN, LAMBDASER, KAPLAMBRATIO No results found for: IGGSERUM, IGA, IGMSERUM No results found for: Odetta Pink, SPEI   Chemistry      Component Value Date/Time   NA 141 08/12/2019 1351   NA 140 01/20/2017 1416   NA 141 01/17/2016 1047   K 4.2 08/12/2019 1351   K 3.9 01/20/2017 1416   K 3.2 (L) 01/17/2016 1047   CL 106 08/12/2019 1351   CL 102 01/20/2017 1416   CO2 28 08/12/2019 1351   CO2 26 01/20/2017 1416   CO2 27 01/17/2016 1047   BUN 20 08/12/2019 1351   BUN 15 01/20/2017 1416   BUN 6.6 (L) 01/17/2016 1047   CREATININE 0.84 08/12/2019 1351   CREATININE 1.1 01/20/2017 1416   CREATININE 0.7 01/17/2016 1047      Component Value Date/Time   CALCIUM 9.1 08/12/2019 1351   CALCIUM 9.1 01/20/2017 1416   CALCIUM 8.7 01/17/2016 1047   ALKPHOS 62 08/12/2019 1351   ALKPHOS 84 01/20/2017 1416   ALKPHOS 90 01/17/2016 1047   AST 18 08/12/2019 1351   AST 34 01/17/2016 1047   ALT 21 08/12/2019 1351   ALT 35 01/20/2017 1416   ALT 38  01/17/2016 1047   BILITOT 0.5 08/12/2019 1351   BILITOT 0.42 01/17/2016 1047       Impression and Plan: Leah [name] is a very pleasant59yo caucasian female withlong standing history ofmetastaticcarcinoidwith recurrent liver metastasis. She continues to follow-up periodically with [facility] and has scans done there. She had a telemedicine visit with Dr. Gertie Fey on 07/15/2019 and will follow-up again in September.  We will proceed with Somatuline injection today as planned.  We continue to closely monitor her Chromogranin A closely and will increase her injection frequency if needed. Today's result is pending.  We will see her again in another month.  She will contact our office with any questions or concerns. We can certainly  see her sooner if needed.   Laverna Peace, NP 7/2/20213:46 PM

## 2019-08-16 LAB — CHROMOGRANIN A: Chromogranin A (ng/mL): 91.4 ng/mL (ref 0.0–101.8)

## 2019-08-16 LAB — LACTATE DEHYDROGENASE: LDH: 260 U/L — ABNORMAL HIGH (ref 98–192)

## 2019-08-18 ENCOUNTER — Other Ambulatory Visit: Payer: Self-pay

## 2019-08-18 ENCOUNTER — Ambulatory Visit (INDEPENDENT_AMBULATORY_CARE_PROVIDER_SITE_OTHER): Payer: 59 | Admitting: Bariatrics

## 2019-08-18 ENCOUNTER — Encounter (INDEPENDENT_AMBULATORY_CARE_PROVIDER_SITE_OTHER): Payer: Self-pay | Admitting: Bariatrics

## 2019-08-18 VITALS — BP 146/79 | HR 83 | Temp 98.7°F | Ht 59.0 in | Wt 226.0 lb

## 2019-08-18 DIAGNOSIS — Z6841 Body Mass Index (BMI) 40.0 and over, adult: Secondary | ICD-10-CM

## 2019-08-18 DIAGNOSIS — I10 Essential (primary) hypertension: Secondary | ICD-10-CM

## 2019-08-18 DIAGNOSIS — E1169 Type 2 diabetes mellitus with other specified complication: Secondary | ICD-10-CM

## 2019-08-18 DIAGNOSIS — E669 Obesity, unspecified: Secondary | ICD-10-CM | POA: Diagnosis not present

## 2019-08-18 DIAGNOSIS — E1159 Type 2 diabetes mellitus with other circulatory complications: Secondary | ICD-10-CM

## 2019-08-22 ENCOUNTER — Encounter (INDEPENDENT_AMBULATORY_CARE_PROVIDER_SITE_OTHER): Payer: Self-pay | Admitting: Bariatrics

## 2019-08-22 NOTE — Progress Notes (Signed)
Chief Complaint:   OBESITY Leah [name] is here to discuss her progress with her obesity treatment plan along with follow-up of her obesity related diagnoses. Sahiba is on the Category 2 Plan and states she is following her eating plan approximately 75% of the time. Dekota states she is exercising 0 minutes 0 times per week.  Today's visit was #: 4 Starting weight: 226 lbs Starting date: 07/07/2019 Today's weight: 226 lbs Today's date: 08/18/2019 Total lbs lost to date: 0 Total lbs lost since last in-office visit: 2  Interim History: Leah [name] is down an additional 2 lbs since her last visit. She has been on vacation. She does state her family is getting more supportive.  Subjective:   Diabetes mellitus type 2 in obese ([place]). Leah [name] is taking Glucophage.   Lab Results  Component Value Date   HGBA1C 7.3 (H) 07/06/2019   HGBA1C 7.1 (H) 12/24/2018   HGBA1C 8.1 (A) 08/31/2018   Lab Results  Component Value Date   MICROALBUR 0.9 07/06/2019   LDLCALC 154 (H) 07/07/2019   CREATININE 0.84 08/12/2019   No results found for: INSULIN  Hypertension associated with type 2 diabetes mellitus ([place]). [name] is taking Norvasc, Toprol XL, and Diovan.  BP Readings from Last 3 Encounters:  08/18/19 (!) 146/79  08/12/19 (!) 147/60  08/04/19 121/79   Lab Results  Component Value Date   CREATININE 0.84 08/12/2019   CREATININE 0.88 07/14/2019   CREATININE 0.74 07/06/2019   Assessment/Plan:   Diabetes mellitus type 2 in obese ([place]). Good blood sugar control is important to decrease the likelihood of diabetic complications such as nephropathy, neuropathy, limb loss, blindness, coronary artery disease, and death. Intensive lifestyle modification including diet, exercise and weight loss are the first line of treatment for diabetes. Avonelle will continue her medication as directed.   Hypertension associated with type 2 diabetes mellitus ([place]). Leah [name] is working on healthy weight loss and  exercise to improve blood pressure control. We will watch for signs of hypotension as she continues her lifestyle modifications. She will continue her medications as directed.  Class 3 severe obesity with serious comorbidity and body mass index (BMI) of 45.0 to 49.9 in adult, unspecified obesity type ([place]).  [name] is currently in the action stage of change. As such, her goal is to continue with weight loss efforts. She has agreed to the Category 2 Plan.   She will work on meal planning, intentional eating, increasing her water intake, and will put reminders on her phone.  Exercise goals: Leah [name] will start with recumbent bike at home.  Behavioral modification strategies: increasing lean protein intake, decreasing simple carbohydrates, increasing vegetables, increasing water intake, decreasing eating out, no skipping meals, meal planning and cooking strategies, keeping healthy foods in the home and planning for success.  Leah [name] has agreed to follow-up with our clinic in 2-3 weeks. She was informed of the importance of frequent follow-up visits to maximize her success with intensive lifestyle modifications for her multiple health conditions.   Objective:   Blood pressure (!) 146/79, pulse 83, temperature 98.7 F (37.1 C), height 4\' 11"  (1.499 m), weight 226 lb (102.5 kg), SpO2 95 %. Body mass index is 45.65 kg/m.  General: Cooperative, alert, well developed, in no acute distress. HEENT: Conjunctivae and lids unremarkable. Cardiovascular: Regular rhythm.  Lungs: Normal work of breathing. Neurologic: No focal deficits.   Lab Results  Component Value Date   CREATININE 0.84 08/12/2019   BUN 20 08/12/2019   NA 141  08/12/2019   K 4.2 08/12/2019   CL 106 08/12/2019   CO2 28 08/12/2019   Lab Results  Component Value Date   ALT 21 08/12/2019   AST 18 08/12/2019   ALKPHOS 62 08/12/2019   BILITOT 0.5 08/12/2019   Lab Results  Component Value Date   HGBA1C 7.3 (H) 07/06/2019   HGBA1C  7.1 (H) 12/24/2018   HGBA1C 8.1 (A) 08/31/2018   HGBA1C 6.5 (H) 11/30/2017   HGBA1C 7.3 01/28/2017   No results found for: INSULIN Lab Results  Component Value Date   TSH 1.69 07/06/2019   Lab Results  Component Value Date   CHOL 245 (H) 07/07/2019   HDL 74 07/07/2019   LDLCALC 154 (H) 07/07/2019   TRIG 98 07/07/2019   CHOLHDL 3 12/24/2018   Lab Results  Component Value Date   WBC 12.7 (H) 08/12/2019   HGB 13.0 08/12/2019   HCT 40.0 08/12/2019   MCV 93.5 08/12/2019   PLT 264 08/12/2019   Lab Results  Component Value Date   IRON <10 (L) 10/18/2008   TIBC NOT CALC Not calculated due to Iron <10. 10/18/2008   FERRITIN 382 (H) 10/18/2008   Attestation Statements:   Reviewed by clinician on day of visit: allergies, medications, problem list, medical history, surgical history, family history, social history, and previous encounter notes.  Time spent on visit including pre-visit chart review and post-visit charting and care was 20 minutes.   Migdalia Dk, am acting as Location manager for [employer], DO   I have reviewed the above documentation for accuracy and completeness, and I agree with the above. Jearld Lesch, DO

## 2019-08-23 DIAGNOSIS — B0052 Herpesviral keratitis: Secondary | ICD-10-CM | POA: Insufficient documentation

## 2019-08-25 ENCOUNTER — Telehealth (INDEPENDENT_AMBULATORY_CARE_PROVIDER_SITE_OTHER): Payer: 59 | Admitting: Psychology

## 2019-08-25 ENCOUNTER — Other Ambulatory Visit: Payer: Self-pay

## 2019-08-25 ENCOUNTER — Telehealth: Payer: Self-pay

## 2019-08-25 DIAGNOSIS — F5089 Other specified eating disorder: Secondary | ICD-10-CM

## 2019-08-25 MED ORDER — ONDANSETRON 4 MG PO TBDP: 4.0000 mg | ORAL_TABLET | Freq: Three times a day (TID) | ORAL | 0 refills | Status: DC | PRN

## 2019-08-25 NOTE — Telephone Encounter (Signed)
Pt is requesting Ondansetron 4 mg tab  LOV: 07/06/2019 Future Visits: 10/10/2019 Last refill: 07/25/2019  Approve?

## 2019-08-25 NOTE — Telephone Encounter (Signed)
Refilled.  Malasha Kleppe, MD [facility] [place]   

## 2019-08-29 NOTE — Progress Notes (Signed)
  Office: [phone]  /  Fax: [phone]    Date: September 12, 2019   Appointment Start Time: 2:07pm Duration: 25 minutes Provider: Glennie Isle, Psy.D. Type of Session: Individual Therapy  Location of Patient: Home Location of Provider: [facility] Type of Contact: Telepsychological Visit via MyChart Video Visit  Session Content: Leah [name] is a 59 y.o. female presenting via [place] Visit for a follow-up appointment to address the previously established treatment goal of increasing coping skills. Today's appointment was a telepsychological visit due to COVID-19. Leah [name] provided verbal consent for today's telepsychological appointment and she is aware she is responsible for securing confidentiality on her end of the session. Prior to proceeding with today's appointment, Leah [name]'s physical location at the time of this appointment was obtained as well a phone number she could be reached at in the event of technical difficulties. Lou and this provider participated in today's telepsychological service.   This provider conducted a brief check-in. Leah [name] reported her eye is getting better. She stated an improvement in eating habits, noting her family has been supportive. Triggers for emotional eating were further reviewed. She described a reduction in emotional eating. Session focused further on mindfulness to assist with coping. Psychoeducation regarding formal (e.g., setting aside a specific time daily to engage in an exercise) and informal (e.g., cultivating awareness in the present moment and taking a non-judgmental approach while engaging in day-to-day tasks) mindfulness was provided. This provider also discussed the utilization of YouTube for mindfulness exercises (e.g., exercises by Merri Ray). Furthermore, termination planning was discussed. Leah [name] was receptive to a follow-up appointment in 3-4 weeks and an additional follow-up/termination appointment in 3-4 weeks  after that. Leah [name] was receptive to today's appointment as evidenced by openness to sharing, responsiveness to feedback, and willingness to continue engaging in mindfulness exercises to assist with coping.  Mental Status Examination:  Appearance: well groomed and appropriate hygiene  Behavior: appropriate to circumstances [name]: euthymic Affect: [name] congruent Speech: normal in rate, volume, and tone Eye Contact: appropriate Psychomotor Activity: appropriate Gait: unable to assess Thought Process: linear, logical, and goal directed  Thought Content/Perception: no hallucinations, delusions, bizarre thinking or behavior reported or observed and no evidence of suicidal and homicidal ideation, plan, and intent Orientation: time, person, place, and purpose of appointment Memory/Concentration: memory, attention, language, and fund of knowledge intact  Insight/Judgment: good  Interventions:  Conducted a brief chart review Provided empathic reflections and validation Employed supportive psychotherapy interventions to facilitate reduced distress and to improve coping skills with identified stressors Psychoeducation provided regarding mindfulness Discussed termination planning  DSM-5 Diagnosis(es): 307.59 (F50.8) Other Specified Feeding or Eating Disorder, Emotional Eating Behaviors   Treatment Goal & Progress: During the initial appointment with this provider, the following treatment goal was established: increase coping skills. [name] has demonstrated progress in her goal as evidenced by increased awareness of hunger patterns and increased awareness of triggers for emotional eating. [name] also demonstrates willingness to engage in mindfulness exercises and reported a reduction in emotional eating.   Plan: The next appointment will be scheduled in 3-4 weeks, which will be via MyChart Video Visit. The next session will focus on working towards the established treatment goal.

## 2019-09-01 ENCOUNTER — Other Ambulatory Visit: Payer: Self-pay | Admitting: Family Medicine

## 2019-09-05 ENCOUNTER — Ambulatory Visit (INDEPENDENT_AMBULATORY_CARE_PROVIDER_SITE_OTHER): Payer: 59 | Admitting: Family Medicine

## 2019-09-06 ENCOUNTER — Other Ambulatory Visit: Payer: Self-pay

## 2019-09-06 ENCOUNTER — Ambulatory Visit (INDEPENDENT_AMBULATORY_CARE_PROVIDER_SITE_OTHER): Payer: 59 | Admitting: Bariatrics

## 2019-09-06 ENCOUNTER — Encounter (INDEPENDENT_AMBULATORY_CARE_PROVIDER_SITE_OTHER): Payer: Self-pay | Admitting: Bariatrics

## 2019-09-06 VITALS — BP 139/80 | HR 75 | Temp 98.2°F | Ht 59.0 in | Wt 221.0 lb

## 2019-09-06 DIAGNOSIS — E669 Obesity, unspecified: Secondary | ICD-10-CM | POA: Diagnosis not present

## 2019-09-06 DIAGNOSIS — F3289 Other specified depressive episodes: Secondary | ICD-10-CM | POA: Diagnosis not present

## 2019-09-06 DIAGNOSIS — E1169 Type 2 diabetes mellitus with other specified complication: Secondary | ICD-10-CM

## 2019-09-06 DIAGNOSIS — Z6841 Body Mass Index (BMI) 40.0 and over, adult: Secondary | ICD-10-CM

## 2019-09-06 NOTE — Progress Notes (Signed)
Chief Complaint:   OBESITY Leah [name] is here to discuss her progress with her obesity treatment plan along with follow-up of her obesity related diagnoses. [name] is on the Category 2 Plan and states she is following her eating plan approximately 80% of the time. [name] states she is exercising 0 minutes 0 times per week.  Today's visit was #: 5 Starting weight: 226 lbs Starting date: 07/07/2019 Today's weight: 221 lbs Today's date: 09/06/2019 Total lbs lost to date: 5 Total lbs lost since last in-office visit: 5  Interim History: Gracen is down 5 lbs. She has been making more changes in her diet and reports doing well with her water intake.  Subjective:   Diabetes mellitus type 2 in obese ([place]). Leah [name] is taking metformin.  Lab Results  Component Value Date   HGBA1C 7.3 (H) 07/06/2019   HGBA1C 7.1 (H) 12/24/2018   HGBA1C 8.1 (A) 08/31/2018   Lab Results  Component Value Date   MICROALBUR 0.9 07/06/2019   LDLCALC 154 (H) 07/07/2019   CREATININE 0.84 08/12/2019   No results found for: INSULIN  Other depression with emotional eating. Leah [name] is struggling with emotional eating and using food for comfort to the extent that it is negatively impacting her health. She has been working on behavior modification techniques to help reduce her emotional eating and has been somewhat successful. She shows no sign of suicidal or homicidal ideations. Leah [name] is seeing Dr. Mallie Mussel. She is taking Effexor and denies significant stress or emotional eating.  Assessment/Plan:   Diabetes mellitus type 2 in obese ([place]). Good blood sugar control is important to decrease the likelihood of diabetic complications such as nephropathy, neuropathy, limb loss, blindness, coronary artery disease, and death. Intensive lifestyle modification including diet, exercise and weight loss are the first line of treatment for diabetes. [name] will continue metformin as directed.   Other depression with  emotional eating. Behavior modification techniques were discussed today to help Leah [name] deal with her emotional/non-hunger eating behaviors.  Orders and follow up as documented in patient record. She will continue follow-up with Dr. Mallie Mussel as directed and as scheduled.  Class 3 severe obesity with serious comorbidity and body mass index (BMI) of 40.0 to 44.9 in adult, unspecified obesity type ([place]).  Leah [name] is currently in the action stage of change. As such, her goal is to continue with weight loss efforts. She has agreed to the Category 2 Plan.   She will work on meal planning, intentional eating, and increasing her water intake.   She will order her groceries online so that she is not in the store.  Exercise goals: Leah [name] will start her stationary bike after left knee injection.  Behavioral modification strategies: increasing lean protein intake, decreasing simple carbohydrates, increasing vegetables, increasing water intake, decreasing eating out, no skipping meals, meal planning and cooking strategies, keeping healthy foods in the home and planning for success.  Leah [name] has agreed to follow-up with our clinic in 2-3 weeks. She was informed of the importance of frequent follow-up visits to maximize her success with intensive lifestyle modifications for her multiple health conditions.   Objective:   Blood pressure (!) 139/80, pulse 75, temperature 98.2 F (36.8 C), height 4\' 11"  (1.499 m), weight (!) 221 lb (100.2 kg), SpO2 96 %. Body mass index is 44.64 kg/m.  General: Cooperative, alert, well developed, in no acute distress. HEENT: Conjunctivae and lids unremarkable. Cardiovascular: Regular rhythm.  Lungs: Normal work of breathing. Neurologic: No focal deficits.  Lab Results  Component Value Date   CREATININE 0.84 08/12/2019   BUN 20 08/12/2019   NA 141 08/12/2019   K 4.2 08/12/2019   CL 106 08/12/2019   CO2 28 08/12/2019   Lab Results  Component Value Date   ALT 21  08/12/2019   AST 18 08/12/2019   ALKPHOS 62 08/12/2019   BILITOT 0.5 08/12/2019   Lab Results  Component Value Date   HGBA1C 7.3 (H) 07/06/2019   HGBA1C 7.1 (H) 12/24/2018   HGBA1C 8.1 (A) 08/31/2018   HGBA1C 6.5 (H) 11/30/2017   HGBA1C 7.3 01/28/2017   No results found for: INSULIN Lab Results  Component Value Date   TSH 1.69 07/06/2019   Lab Results  Component Value Date   CHOL 245 (H) 07/07/2019   HDL 74 07/07/2019   LDLCALC 154 (H) 07/07/2019   TRIG 98 07/07/2019   CHOLHDL 3 12/24/2018   Lab Results  Component Value Date   WBC 12.7 (H) 08/12/2019   HGB 13.0 08/12/2019   HCT 40.0 08/12/2019   MCV 93.5 08/12/2019   PLT 264 08/12/2019   Lab Results  Component Value Date   IRON <10 (L) 10/18/2008   TIBC NOT CALC Not calculated due to Iron <10. 10/18/2008   FERRITIN 382 (H) 10/18/2008   Attestation Statements:   Reviewed by clinician on day of visit: allergies, medications, problem list, medical history, surgical history, family history, social history, and previous encounter notes.  Time spent on visit including pre-visit chart review and post-visit charting and care was 20 minutes.   Migdalia Dk, am acting as Location manager for [employer], DO   I have reviewed the above documentation for accuracy and completeness, and I agree with the above. Jearld Lesch, DO

## 2019-09-12 ENCOUNTER — Other Ambulatory Visit: Payer: Self-pay

## 2019-09-12 ENCOUNTER — Telehealth (INDEPENDENT_AMBULATORY_CARE_PROVIDER_SITE_OTHER): Payer: 59 | Admitting: Psychology

## 2019-09-12 DIAGNOSIS — F5089 Other specified eating disorder: Secondary | ICD-10-CM | POA: Diagnosis not present

## 2019-09-14 ENCOUNTER — Other Ambulatory Visit: Payer: Self-pay | Admitting: Family Medicine

## 2019-09-14 DIAGNOSIS — M4722 Other spondylosis with radiculopathy, cervical region: Secondary | ICD-10-CM

## 2019-09-16 ENCOUNTER — Inpatient Hospital Stay: Payer: 59

## 2019-09-16 ENCOUNTER — Other Ambulatory Visit: Payer: Self-pay

## 2019-09-16 ENCOUNTER — Telehealth: Payer: Self-pay | Admitting: Family

## 2019-09-16 ENCOUNTER — Encounter: Payer: Self-pay | Admitting: Family

## 2019-09-16 ENCOUNTER — Inpatient Hospital Stay: Payer: 59 | Attending: Hematology & Oncology | Admitting: Family

## 2019-09-16 VITALS — BP 132/80 | HR 74 | Temp 98.4°F | Resp 18 | Ht 59.0 in | Wt 227.0 lb

## 2019-09-16 DIAGNOSIS — C7B02 Secondary carcinoid tumors of liver: Secondary | ICD-10-CM | POA: Insufficient documentation

## 2019-09-16 DIAGNOSIS — C7B8 Other secondary neuroendocrine tumors: Secondary | ICD-10-CM

## 2019-09-16 DIAGNOSIS — C7A00 Malignant carcinoid tumor of unspecified site: Secondary | ICD-10-CM | POA: Insufficient documentation

## 2019-09-16 LAB — CBC WITH DIFFERENTIAL (CANCER CENTER ONLY)
Abs Immature Granulocytes: 0.07 10*3/uL (ref 0.00–0.07)
Basophils Absolute: 0.1 10*3/uL (ref 0.0–0.1)
Basophils Relative: 1 %
Eosinophils Absolute: 0.2 10*3/uL (ref 0.0–0.5)
Eosinophils Relative: 2 %
HCT: 39.5 % (ref 36.0–46.0)
Hemoglobin: 12.8 g/dL (ref 12.0–15.0)
Immature Granulocytes: 1 %
Lymphocytes Relative: 29 %
Lymphs Abs: 3.4 10*3/uL (ref 0.7–4.0)
MCH: 31.1 pg (ref 26.0–34.0)
MCHC: 32.4 g/dL (ref 30.0–36.0)
MCV: 95.9 fL (ref 80.0–100.0)
Monocytes Absolute: 0.6 10*3/uL (ref 0.1–1.0)
Monocytes Relative: 6 %
Neutro Abs: 7.2 10*3/uL (ref 1.7–7.7)
Neutrophils Relative %: 61 %
Platelet Count: 258 10*3/uL (ref 150–400)
RBC: 4.12 MIL/uL (ref 3.87–5.11)
RDW: 14.5 % (ref 11.5–15.5)
WBC Count: 11.5 10*3/uL — ABNORMAL HIGH (ref 4.0–10.5)
nRBC: 0 % (ref 0.0–0.2)

## 2019-09-16 LAB — CMP (CANCER CENTER ONLY)
ALT: 27 U/L (ref 0–44)
AST: 30 U/L (ref 15–41)
Albumin: 3.6 g/dL (ref 3.5–5.0)
Alkaline Phosphatase: 64 U/L (ref 38–126)
Anion gap: 14 (ref 5–15)
BUN: 15 mg/dL (ref 6–20)
CO2: 24 mmol/L (ref 22–32)
Calcium: 8.9 mg/dL (ref 8.9–10.3)
Chloride: 102 mmol/L (ref 98–111)
Creatinine: 0.78 mg/dL (ref 0.44–1.00)
GFR, Est AFR Am: >60 mL/min (ref >60)
GFR, Estimated: >60 mL/min (ref >60)
Glucose, Bld: 177 mg/dL — ABNORMAL HIGH (ref 70–99)
Potassium: 4.3 mmol/L (ref 3.5–5.1)
Sodium: 140 mmol/L (ref 135–145)
Total Bilirubin: 0.6 mg/dL (ref 0.3–1.2)
Total Protein: 7.2 g/dL (ref 6.5–8.1)

## 2019-09-16 MED ORDER — LANREOTIDE ACETATE 120 MG/0.5ML ~~LOC~~ SOLN
SUBCUTANEOUS | Status: AC
  Filled 2019-09-16: qty 120

## 2019-09-16 MED ORDER — LANREOTIDE ACETATE 120 MG/0.5ML ~~LOC~~ SOLN
120.0000 mg | Freq: Once | SUBCUTANEOUS | Status: AC
  Administered 2019-09-16: 120 mg via SUBCUTANEOUS

## 2019-09-16 NOTE — Patient Instructions (Signed)
Lanreotide injection What is this medicine? LANREOTIDE (lan REE oh tide) is used to reduce blood levels of growth hormone in patients with a condition called acromegaly. It also works to slow or stop tumor growth in patients with neuroendocrine tumors and treat carcinoid syndrome. This medicine may be used for other purposes; ask your health care provider or pharmacist if you have questions. COMMON BRAND NAME(S): Somatuline [place] What should I tell my health care provider before I take this medicine? They need to know if you have any of these conditions:  diabetes  gallbladder disease  heart disease  kidney disease  liver disease  thyroid disease  an unusual or allergic reaction to lanreotide, other medicines, foods, dyes, or preservatives  pregnant or trying to get pregnant  breast-feeding How should I use this medicine? This medicine is for injection under the skin. It is given by a health care professional in a hospital or clinic setting. Contact your pediatrician or health care professional regarding the use of this medicine in children. Special care may be needed. Overdosage: If you think you have taken too much of this medicine contact a poison control center or emergency room at once. NOTE: This medicine is only for you. Do not share this medicine with others. What if I miss a dose? It is important not to miss your dose. Call your doctor or health care professional if you are unable to keep an appointment. What may interact with this medicine? This medicine may interact with the following medications:  bromocriptine  cyclosporine  certain medicines for blood pressure, heart disease, irregular heart beat  certain medicines for diabetes  quinidine  terfenadine This list may not describe all possible interactions. Give your health care provider a list of all the medicines, herbs, non-prescription drugs, or dietary supplements you use. Also tell them if you smoke,  drink alcohol, or use illegal drugs. Some items may interact with your medicine. What should I watch for while using this medicine? Tell your doctor or healthcare professional if your symptoms do not start to get better or if they get worse. Visit your doctor or health care professional for regular checks on your progress. Your condition will be monitored carefully while you are receiving this medicine. This medicine may increase blood sugar. Ask your healthcare provider if changes in diet or medicines are needed if you have diabetes. You may need blood work done while you are taking this medicine. Women should inform their doctor if they wish to become pregnant or think they might be pregnant. There is a potential for serious side effects to an unborn child. Talk to your health care professional or pharmacist for more information. Do not breast-feed an infant while taking this medicine or for 6 months after stopping it. This medicine has caused ovarian failure in some women. This medicine may interfere with the ability to have a child. Talk with your doctor or health care professional if you are concerned about your fertility. What side effects may I notice from receiving this medicine? Side effects that you should report to your doctor or health care professional as soon as possible:  allergic reactions like skin rash, itching or hives, swelling of the face, lips, or tongue  increased blood pressure  severe stomach pain  signs and symptoms of hgh blood sugar such as being more thirsty or hungry or having to urinate more than normal. You may also feel very tired or have blurry vision.  signs and symptoms of low blood   sugar such as feeling anxious; confusion; dizziness; increased hunger; unusually weak or tired; sweating; shakiness; cold; irritable; headache; blurred vision; fast heartbeat; loss of consciousness  unusually slow heartbeat Side effects that usually do not require medical  attention (report to your doctor or health care professional if they continue or are bothersome):  constipation  diarrhea  dizziness  headache  muscle pain  muscle spasms  nausea  pain, redness, or irritation at site where injected This list may not describe all possible side effects. Call your doctor for medical advice about side effects. You may report side effects to FDA at 1-800-FDA-1088. Where should I keep my medicine? This drug is given in a hospital or clinic and will not be stored at home. NOTE: This sheet is a summary. It may not cover all possible information. If you have questions about this medicine, talk to your doctor, pharmacist, or health care provider.  2020 [employer]vier/Gold Standard (2017-11-05 09:13:08)  

## 2019-09-16 NOTE — Progress Notes (Signed)
Hematology and Oncology Follow Up Visit  Leah [name] [id number] 1960/09/14 59 y.o. 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 She is doing well but has noted some recent abdominal discomfort and constipation. She takes Mirilax daily.  She follows up again in a couple week with her team at [facility] and will have her repeat MRI of the liver at that time.  Chromogranin A level last month had come down to 91.4.  No fever, chills, n/v, cough, rash, dizziness, SOB, chest pain, palpitations or changes in bladder habits.  No episodes of bleeding. No bruising or petechiae.  No swelling, tenderness, numbness or tingling in her extremities at this time.  No falls or syncope.  She has maintained a good appetite but admits that she needs to better hydrated throughout the day. Her weight is up 6 lbs since her last visit.   ECOG Performance Status: 1 - Symptomatic but completely ambulatory  Medications:  Allergies as of 09/16/2019   No Known Allergies     Medication List       Accurate as of September 16, 2019 11:44 AM. If you have any questions, ask your nurse or doctor.        amLODipine 10 MG tablet Commonly known as: NORVASC TAKE 1 TABLET BY MOUTH EVERY DAY   aspirin 81 MG EC tablet TAKE 1 TABLET BY MOUTH EVERY DAY *SWALLOW WHOLE*   fexofenadine 180 MG tablet Commonly known as: ALLEGRA TAKE 1 TABLET BY MOUTH EVERY DAY   gabapentin 100 MG capsule Commonly known as: NEURONTIN TAKE 1 CAPSULE (100 MG TOTAL) BY MOUTH AT BEDTIME.   metFORMIN 500 MG tablet Commonly known as: GLUCOPHAGE Take 1 tablet (500 mg total) by mouth 2 (two) times daily with a meal.   metoprolol succinate 100 MG 24 hr tablet Commonly known as: TOPROL-XL TAKE 1 TABLET BY MOUTH  EVERY EVENING WITH OR IMMEDIATELY FOLLOWING A MEAL   montelukast 10 MG tablet Commonly known as: SINGULAIR TAKE 1 TABLET BY MOUTH EVERYDAY AT BEDTIME   naproxen 500 MG tablet Commonly known as: NAPROSYN TAKE 1 TABLET TWICE A DAY WITH A MEAL   octreotide 30 MG injection Commonly known as: SANDOSTATIN LAR Inject 30 mg into the muscle every 28 (twenty-eight) days.   ondansetron 4 MG disintegrating tablet Commonly known as: ZOFRAN-ODT Take 1 tablet (4 mg total) by mouth every 8 (eight) hours as needed for nausea or vomiting.   Osphena 60 MG Tabs Generic drug: Ospemifene Take 1 tablet by mouth daily.   pantoprazole 40 MG tablet Commonly known as: PROTONIX TAKE 1 TABLET BY MOUTH TWICE A DAY   ProAir HFA 108 (90 Base) MCG/ACT inhaler Generic drug: albuterol INHALE 2 PUFFS INTO THE LUNGS EVERY 6 (SIX) HOURS AS NEEDED. FOR SHORTNESS OF BREATH. What changed: See the new instructions.   rosuvastatin 10 MG tablet Commonly known as: Crestor Take 1 tablet (10 mg total) by mouth daily.   valACYclovir 500 MG tablet Commonly known as: VALTREX TAKE 1 TABLET BY MOUTH TWICE A DAY AS NEEDED FOR COLD SORES   valsartan 320 MG tablet Commonly known as: DIOVAN TAKE 1 TABLET BY MOUTH EVERY DAY   venlafaxine XR 150 MG 24 hr capsule Commonly known as: EFFEXOR-XR TAKE 1 CAPSULE (150 MG TOTAL) BY MOUTH EACH MORNING  Allergies: No Known Allergies  Past Medical History, Surgical history, Social history, and Family History were reviewed and updated.  Review of Systems: All other 10 point review of systems is negative.   Physical Exam:  height is 4\' 11"  (1.499 m) and weight is 227 lb (103 kg). Her oral temperature is 98.4 F (36.9 C). Her blood pressure is 132/80 and her pulse is 74. Her respiration is 18 and oxygen saturation is 99%.   Wt Readings from Last 3 Encounters:  09/16/19 227 lb (103 kg)  09/06/19 (!) 221 lb (100.2 kg)  08/18/19 226 lb (102.5 kg)    Ocular: Sclerae  unicteric, pupils equal, round and reactive to light Ear-nose-throat: Oropharynx clear, dentition fair Lymphatic: No cervical or supraclavicular adenopathy Lungs no rales or rhonchi, good excursion bilaterally Heart regular rate and rhythm, no murmur appreciated Abd soft, nontender, positive bowel sounds, no liver or spleen tip palpated on exam, no fluid wave  MSK no focal spinal tenderness, no joint edema Neuro: non-focal, well-oriented, appropriate affect Breasts: Deferred   Lab Results  Component Value Date   WBC 11.5 (H) 09/16/2019   HGB 12.8 09/16/2019   HCT 39.5 09/16/2019   MCV 95.9 09/16/2019   PLT 258 09/16/2019   Lab Results  Component Value Date   FERRITIN 382 (H) 10/18/2008   IRON <10 (L) 10/18/2008   TIBC NOT CALC Not calculated due to Iron <10. 10/18/2008   UIBC 156 10/18/2008   IRONPCTSAT NOT CALC Not calculated due to Iron <10. 10/18/2008   Lab Results  Component Value Date   RETICCTPCT 0.9 10/18/2008   RBC 4.12 09/16/2019   No results found for: KPAFRELGTCHN, LAMBDASER, KAPLAMBRATIO No results found for: IGGSERUM, IGA, IGMSERUM No results found for: Odetta Pink, SPEI   Chemistry      Component Value Date/Time   NA 141 08/12/2019 1351   NA 140 01/20/2017 1416   NA 141 01/17/2016 1047   K 4.2 08/12/2019 1351   K 3.9 01/20/2017 1416   K 3.2 (L) 01/17/2016 1047   CL 106 08/12/2019 1351   CL 102 01/20/2017 1416   CO2 28 08/12/2019 1351   CO2 26 01/20/2017 1416   CO2 27 01/17/2016 1047   BUN 20 08/12/2019 1351   BUN 15 01/20/2017 1416   BUN 6.6 (L) 01/17/2016 1047   CREATININE 0.84 08/12/2019 1351   CREATININE 1.1 01/20/2017 1416   CREATININE 0.7 01/17/2016 1047      Component Value Date/Time   CALCIUM 9.1 08/12/2019 1351   CALCIUM 9.1 01/20/2017 1416   CALCIUM 8.7 01/17/2016 1047   ALKPHOS 62 08/12/2019 1351   ALKPHOS 84 01/20/2017 1416   ALKPHOS 90 01/17/2016 1047   AST 18 08/12/2019 1351    AST 34 01/17/2016 1047   ALT 21 08/12/2019 1351   ALT 35 01/20/2017 1416   ALT 38 01/17/2016 1047   BILITOT 0.5 08/12/2019 1351   BILITOT 0.42 01/17/2016 1047       Impression and Plan: Leah [name] is a very pleasant59yo caucasian female withlong standing history ofmetastaticcarcinoidwith recurrent liver metastasis. Chromogranin A level is pending.  We will proceed with Somatuline injection today as planned.  She has her repeat MRI of the liver at [facility] in the next 2-3 weeks.  We will plan to see her again in a month.  She can contact our office with any questions or concerns. We can certainly see her sooner if needed.   Laverna Peace, NP  8/6/202111:44 AM

## 2019-09-16 NOTE — Telephone Encounter (Signed)
Appointments scheduled calendar printed per 8/6 los 

## 2019-09-19 LAB — LACTATE DEHYDROGENASE: LDH: 252 U/L — ABNORMAL HIGH (ref 98–192)

## 2019-09-19 LAB — CHROMOGRANIN A: Chromogranin A (ng/mL): 73.5 ng/mL (ref 0.0–101.8)

## 2019-09-20 ENCOUNTER — Other Ambulatory Visit: Payer: Self-pay | Admitting: Family Medicine

## 2019-09-20 DIAGNOSIS — M4722 Other spondylosis with radiculopathy, cervical region: Secondary | ICD-10-CM

## 2019-09-21 ENCOUNTER — Encounter (INDEPENDENT_AMBULATORY_CARE_PROVIDER_SITE_OTHER): Payer: Self-pay | Admitting: Bariatrics

## 2019-09-21 NOTE — Progress Notes (Unsigned)
  Office: [phone]  /  Fax: [phone]    Date: October 05, 2019   Appointment Start Time: *** Duration: *** minutes Provider: Glennie Isle, Psy.D. Type of Session: Individual Therapy  Location of Patient: {gbptloc:23249} Location of Provider: Provider's Home Type of Contact: Telepsychological Visit via MyChart Video Visit  Session Content: This provider called Leah [name] at 10:02am as she did not present for the telepsychological appointment. *** As such, today's appointment was initiated *** minutes late.  [name] is a 59 y.o. female presenting for a follow-up appointment to address the previously established treatment goal of increasing coping skills. Today's appointment was a telepsychological visit due to COVID-19. Leah [name] provided verbal consent for today's telepsychological appointment and she is aware she is responsible for securing confidentiality on her end of the session. Prior to proceeding with today's appointment, Damita's physical location at the time of this appointment was obtained as well a phone number she could be reached at in the event of technical difficulties. Sameeha and this provider participated in today's telepsychological service.   This provider conducted a brief check-in. ***Session focused further on mindfulness to assist with coping. *** Shagun was led through a mindfulness exercise (A Taste of MIndfulness) and her experience was processed. Esmeralda provided verbal consent during today's appointment for this provider to send the handout for today's exercise via e-mail. Aritha was receptive to today's appointment as evidenced by openness to sharing, responsiveness to feedback, and {gbreceptiveness:23401}.  Mental Status Examination:  Appearance: well groomed and appropriate hygiene  Behavior: appropriate to circumstances [name]: euthymic Affect: [name] congruent Speech: normal in rate, volume, and tone Eye Contact: appropriate Psychomotor Activity: appropriate Gait:  unable to assess Thought Process: linear, logical, and goal directed  Thought Content/Perception: no hallucinations, delusions, bizarre thinking or behavior reported or observed and no evidence of suicidal and homicidal ideation, plan, and intent Orientation: time, person, place, and purpose of appointment Memory/Concentration: memory, attention, language, and fund of knowledge intact  Insight/Judgment: {Insight:22446}  Interventions:  {Interventions for Progress Notes:23405}  DSM-5 Diagnosis(es): 307.59 (F50.8) Other Specified Feeding or Eating Disorder, Emotional Eating Behaviors  Treatment Goal & Progress: During the initial appointment with this provider, the following treatment goal was established: increase coping skills. Cherylene has demonstrated progress in her goal as evidenced by increased awareness of hunger patterns and increased awareness of triggers for emotional eating. Shamra also continues to demonstrate willingness to engage in learned skill(s).  Plan: The next appointment will be scheduled in {gbweeks:21758}, which will be via MyChart Video Visit. The next session will focus on working towards the established treatment goal and termination.

## 2019-09-22 ENCOUNTER — Ambulatory Visit (INDEPENDENT_AMBULATORY_CARE_PROVIDER_SITE_OTHER): Payer: 59 | Admitting: Bariatrics

## 2019-10-03 ENCOUNTER — Other Ambulatory Visit: Payer: Self-pay | Admitting: Family Medicine

## 2019-10-03 DIAGNOSIS — F419 Anxiety disorder, unspecified: Secondary | ICD-10-CM

## 2019-10-03 DIAGNOSIS — J301 Allergic rhinitis due to pollen: Secondary | ICD-10-CM

## 2019-10-04 MED ORDER — MONTELUKAST SODIUM 10 MG PO TABS: ORAL_TABLET | ORAL | 1 refills | Status: DC

## 2019-10-04 NOTE — Telephone Encounter (Signed)
Please let patient know we have a refill request for klonopin, but this looks like it was stopped by another doctor. Can we verify this? Okay to send in her singulair.  Thanks!  Dr. Rogers Blocker

## 2019-10-05 ENCOUNTER — Telehealth (INDEPENDENT_AMBULATORY_CARE_PROVIDER_SITE_OTHER): Payer: 59 | Admitting: Psychology

## 2019-10-05 ENCOUNTER — Telehealth (INDEPENDENT_AMBULATORY_CARE_PROVIDER_SITE_OTHER): Payer: Self-pay | Admitting: Psychology

## 2019-10-05 ENCOUNTER — Other Ambulatory Visit: Payer: Self-pay

## 2019-10-05 NOTE — Telephone Encounter (Signed)
  Office: [phone]  /  Fax: [phone]  Date of Call: October 05, 2019  Time of Call: 10:02am Duration of Call: 3 minutes Provider: Glennie Isle, PsyD  CONTENT:  This provider called Leah [name] to check-in as she did not present for today's MyChart Video Visit appointment at 10:00am. [name] indicated she was not feeling well, noting she went to urgent care yesterday. She was heard coughing frequently during this call. She stated "everything is good" otherwise and noted a plan to call the clinic once she is feeling better to reschedule today's appointment. No evidence or endorsement of safety concerns.   PLAN:  This provider will wait for [name] to call back. No further follow-up planned by this provider.

## 2019-10-10 ENCOUNTER — Ambulatory Visit: Payer: 59 | Admitting: Family Medicine

## 2019-10-12 ENCOUNTER — Ambulatory Visit: Payer: 59 | Admitting: Family Medicine

## 2019-10-12 ENCOUNTER — Encounter: Payer: Self-pay | Admitting: Family Medicine

## 2019-10-20 ENCOUNTER — Inpatient Hospital Stay: Payer: 59

## 2019-10-20 ENCOUNTER — Other Ambulatory Visit: Payer: Self-pay

## 2019-10-20 ENCOUNTER — Telehealth: Payer: Self-pay | Admitting: Hematology & Oncology

## 2019-10-20 ENCOUNTER — Inpatient Hospital Stay: Payer: 59 | Attending: Hematology & Oncology

## 2019-10-20 ENCOUNTER — Inpatient Hospital Stay (HOSPITAL_BASED_OUTPATIENT_CLINIC_OR_DEPARTMENT_OTHER): Payer: 59 | Admitting: Hematology & Oncology

## 2019-10-20 VITALS — BP 162/93 | HR 77 | Resp 18 | Wt 226.8 lb

## 2019-10-20 DIAGNOSIS — C7B8 Other secondary neuroendocrine tumors: Secondary | ICD-10-CM

## 2019-10-20 DIAGNOSIS — C7A00 Malignant carcinoid tumor of unspecified site: Secondary | ICD-10-CM | POA: Insufficient documentation

## 2019-10-20 DIAGNOSIS — C7B02 Secondary carcinoid tumors of liver: Secondary | ICD-10-CM | POA: Diagnosis not present

## 2019-10-20 LAB — CBC WITH DIFFERENTIAL (CANCER CENTER ONLY)
Abs Immature Granulocytes: 0.06 10*3/uL (ref 0.00–0.07)
Basophils Absolute: 0.1 10*3/uL (ref 0.0–0.1)
Basophils Relative: 1 %
Eosinophils Absolute: 0.2 10*3/uL (ref 0.0–0.5)
Eosinophils Relative: 2 %
HCT: 38.2 % (ref 36.0–46.0)
Hemoglobin: 12.4 g/dL (ref 12.0–15.0)
Immature Granulocytes: 1 %
Lymphocytes Relative: 25 %
Lymphs Abs: 2.7 10*3/uL (ref 0.7–4.0)
MCH: 30.8 pg (ref 26.0–34.0)
MCHC: 32.5 g/dL (ref 30.0–36.0)
MCV: 95 fL (ref 80.0–100.0)
Monocytes Absolute: 0.6 10*3/uL (ref 0.1–1.0)
Monocytes Relative: 6 %
Neutro Abs: 7 10*3/uL (ref 1.7–7.7)
Neutrophils Relative %: 65 %
Platelet Count: 268 10*3/uL (ref 150–400)
RBC: 4.02 MIL/uL (ref 3.87–5.11)
RDW: 13.2 % (ref 11.5–15.5)
WBC Count: 10.6 10*3/uL — ABNORMAL HIGH (ref 4.0–10.5)
nRBC: 0 % (ref 0.0–0.2)

## 2019-10-20 LAB — CMP (CANCER CENTER ONLY)
ALT: 24 U/L (ref 0–44)
AST: 22 U/L (ref 15–41)
Albumin: 3.8 g/dL (ref 3.5–5.0)
Alkaline Phosphatase: 65 U/L (ref 38–126)
Anion gap: 9 (ref 5–15)
BUN: 20 mg/dL (ref 6–20)
CO2: 25 mmol/L (ref 22–32)
Calcium: 9.2 mg/dL (ref 8.9–10.3)
Chloride: 105 mmol/L (ref 98–111)
Creatinine: 0.74 mg/dL (ref 0.44–1.00)
GFR, Est AFR Am: >60 mL/min (ref >60)
GFR, Estimated: >60 mL/min (ref >60)
Glucose, Bld: 150 mg/dL — ABNORMAL HIGH (ref 70–99)
Potassium: 3.9 mmol/L (ref 3.5–5.1)
Sodium: 139 mmol/L (ref 135–145)
Total Bilirubin: 0.5 mg/dL (ref 0.3–1.2)
Total Protein: 6.8 g/dL (ref 6.5–8.1)

## 2019-10-20 LAB — LACTATE DEHYDROGENASE: LDH: 235 U/L — ABNORMAL HIGH (ref 98–192)

## 2019-10-20 MED ORDER — LANREOTIDE ACETATE 120 MG/0.5ML ~~LOC~~ SOLN
SUBCUTANEOUS | Status: AC
  Filled 2019-10-20: qty 120

## 2019-10-20 MED ORDER — LANREOTIDE ACETATE 120 MG/0.5ML ~~LOC~~ SOLN
120.0000 mg | Freq: Once | SUBCUTANEOUS | Status: AC
  Administered 2019-10-20: 120 mg via SUBCUTANEOUS

## 2019-10-20 NOTE — Progress Notes (Signed)
Hematology and Oncology Follow Up Visit  Leah [name] [id number] 19-Aug-1960 59 y.o. 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  She feels okay.  She really has no specific complaints.  There is been no problems with nausea or vomiting.  She has had no fever.  There has been no cough or shortness of breath.  She has had no wheezing.  There has been no problems with diarrhea.  She has had her last MRI at [facility] in November.  I think she sees her doctors at [facility] and October and will have an MRI at that time.   Her last chromogranin A level was 74 back in early August.    She has had no problems with fever.  There has been no cough.  She has had no leg swelling.  She has had no rashes.  There has been no headache.  She is on a dietary pill to try to lose some weight.  Overall, I would have to say her performance status is ECOG 0.    Medications:  Allergies as of 10/20/2019   No Known Allergies     Medication List       Accurate as of October 20, 2019 12:30 PM. If you have any questions, ask your nurse or doctor.        amLODipine 10 MG tablet Commonly known as: NORVASC TAKE 1 TABLET BY MOUTH EVERY DAY   aspirin 81 MG EC tablet TAKE 1 TABLET BY MOUTH EVERY DAY *SWALLOW WHOLE*   clonazePAM 0.5 MG tablet Commonly known as: KLONOPIN clonazepam 0.5 mg tablet   fexofenadine 180 MG tablet Commonly known as: ALLEGRA TAKE 1 TABLET BY MOUTH EVERY DAY   gabapentin 100 MG capsule Commonly known as: NEURONTIN TAKE 1 CAPSULE (100 MG TOTAL) BY MOUTH AT BEDTIME.   ketoconazole 2 % cream Commonly known as: NIZORAL ketoconazole 2 % topical cream   metFORMIN 500 MG tablet Commonly known as: GLUCOPHAGE Take 1 tablet (500 mg total) by mouth  2 (two) times daily with a meal.   metoprolol succinate 100 MG 24 hr tablet Commonly known as: TOPROL-XL TAKE 1 TABLET BY MOUTH EVERY EVENING WITH OR IMMEDIATELY FOLLOWING A MEAL   montelukast 10 MG tablet Commonly known as: SINGULAIR TAKE 1 TABLET BY MOUTH EVERYDAY AT BEDTIME   montelukast 10 MG tablet Commonly known as: SINGULAIR Take by mouth.   moxifloxacin 0.5 % ophthalmic solution Commonly known as: VIGAMOX   moxifloxacin 0.5 % ophthalmic solution Commonly known as: VIGAMOX   naproxen 500 MG tablet Commonly known as: NAPROSYN TAKE 1 TABLET TWICE A DAY WITH A MEAL   octreotide 30 MG injection Commonly known as: SANDOSTATIN LAR Inject 30 mg into the muscle every 28 (twenty-eight) days.   ondansetron 4 MG disintegrating tablet Commonly known as: ZOFRAN-ODT Take 1 tablet (4 mg total) by mouth every 8 (eight) hours as needed for nausea or vomiting.   Osphena 60 MG Tabs Generic drug: Ospemifene Take 1 tablet by mouth daily.   oxyCODONE 5 MG immediate release tablet Commonly known as: Oxy IR/ROXICODONE oxycodone 5 mg tablet   pantoprazole 40 MG tablet Commonly known as: PROTONIX TAKE 1 TABLET BY MOUTH TWICE A DAY   ProAir HFA 108 (90 Base) MCG/ACT inhaler Generic drug: albuterol INHALE 2 PUFFS  INTO THE LUNGS EVERY 6 (SIX) HOURS AS NEEDED. FOR SHORTNESS OF BREATH. What changed: See the new instructions.   Qsymia 7.5-46 MG Cp24 Generic drug: Phentermine-Topiramate Qsymia 7.5 mg-46 mg capsule, extended release   rosuvastatin 10 MG tablet Commonly known as: Crestor Take 1 tablet (10 mg total) by mouth daily.   valACYclovir 500 MG tablet Commonly known as: VALTREX TAKE 1 TABLET BY MOUTH TWICE A DAY AS NEEDED FOR COLD SORES   valACYclovir 1000 MG tablet Commonly known as: VALTREX Take by mouth.   valsartan 320 MG tablet Commonly known as: DIOVAN TAKE 1 TABLET BY MOUTH EVERY DAY   venlafaxine XR 150 MG 24 hr capsule Commonly known as: EFFEXOR-XR TAKE 1  CAPSULE (150 MG TOTAL) BY MOUTH EACH MORNING   Xerese 5-1 % Crea Generic drug: Acyclovir-Hydrocortisone Xerese 5 %-1 % topical cream       Allergies: No Known Allergies  Past Medical History, Surgical history, Social history, and Family History were reviewed and updated.  Review of Systems: Review of Systems  Constitutional: Negative.   HENT: Negative.   Eyes: Negative.   Respiratory: Negative.   Cardiovascular: Negative.   Gastrointestinal: Negative.   Genitourinary: Negative.   Musculoskeletal: Negative.   Skin: Negative.   Neurological: Negative.   Endo/Heme/Allergies: Negative.   Psychiatric/Behavioral: Negative.      Physical Exam:  weight is 226 lb 12 oz (102.9 kg). Her blood pressure is 162/93 (abnormal) and her pulse is 77. Her respiration is 18 and oxygen saturation is 98%.   Wt Readings from Last 3 Encounters:  10/20/19 226 lb 12 oz (102.9 kg)  09/16/19 227 lb (103 kg)  09/06/19 (!) 221 lb (100.2 kg)    Physical Exam Vitals reviewed.  HENT:     Head: Normocephalic and atraumatic.  Eyes:     Pupils: Pupils are equal, round, and reactive to light.  Cardiovascular:     Rate and Rhythm: Normal rate and regular rhythm.     Heart sounds: Normal heart sounds.  Pulmonary:     Effort: Pulmonary effort is normal.     Breath sounds: Normal breath sounds.  Abdominal:     General: Bowel sounds are normal.     Palpations: Abdomen is soft.     Comments: Abdominal exam shows an obese abdomen that is soft.  She has good bowel sounds.  There is no fluid wave.  She has laparotomy scars that are well-healed.  There is no tenderness.  There is no guarding or rebound.  There is no palpable liver or spleen tip.  Musculoskeletal:        General: No tenderness or deformity. Normal range of motion.     Cervical back: Normal range of motion.  Lymphadenopathy:     Cervical: No cervical adenopathy.  Skin:    General: Skin is warm and dry.     Findings: No erythema or rash.    Neurological:     Mental Status: She is alert and oriented to person, place, and time.  Psychiatric:        Behavior: Behavior normal.        Thought Content: Thought content normal.        Judgment: Judgment normal.      Lab Results  Component Value Date   WBC 10.6 (H) 10/20/2019   HGB 12.4 10/20/2019   HCT 38.2 10/20/2019   MCV 95.0 10/20/2019   PLT 268 10/20/2019   Lab Results  Component Value Date   FERRITIN 382 (  H) 10/18/2008   IRON <10 (L) 10/18/2008   TIBC NOT CALC Not calculated due to Iron <10. 10/18/2008   UIBC 156 10/18/2008   IRONPCTSAT NOT CALC Not calculated due to Iron <10. 10/18/2008   Lab Results  Component Value Date   RETICCTPCT 0.9 10/18/2008   RBC 4.02 10/20/2019   No results found for: KPAFRELGTCHN, LAMBDASER, KAPLAMBRATIO No results found for: Kandis Cocking, IGMSERUM No results found for: Odetta Pink, SPEI   Chemistry      Component Value Date/Time   NA 139 10/20/2019 1115   NA 140 01/20/2017 1416   NA 141 01/17/2016 1047   K 3.9 10/20/2019 1115   K 3.9 01/20/2017 1416   K 3.2 (L) 01/17/2016 1047   CL 105 10/20/2019 1115   CL 102 01/20/2017 1416   CO2 25 10/20/2019 1115   CO2 26 01/20/2017 1416   CO2 27 01/17/2016 1047   BUN 20 10/20/2019 1115   BUN 15 01/20/2017 1416   BUN 6.6 (L) 01/17/2016 1047   CREATININE 0.74 10/20/2019 1115   CREATININE 1.1 01/20/2017 1416   CREATININE 0.7 01/17/2016 1047      Component Value Date/Time   CALCIUM 9.2 10/20/2019 1115   CALCIUM 9.1 01/20/2017 1416   CALCIUM 8.7 01/17/2016 1047   ALKPHOS 65 10/20/2019 1115   ALKPHOS 84 01/20/2017 1416   ALKPHOS 90 01/17/2016 1047   AST 22 10/20/2019 1115   AST 34 01/17/2016 1047   ALT 24 10/20/2019 1115   ALT 35 01/20/2017 1416   ALT 38 01/17/2016 1047   BILITOT 0.5 10/20/2019 1115   BILITOT 0.42 01/17/2016 1047       Impression and Plan: Leah [name] is a very pleasant 59 yo caucasian female  withlong standing history ofmetastaticcarcinoidwith recurrent liver metastasis.  We will have to see what the chromogranin A level is.  Hopefully it is stable.    She comes in monthly for Somatuline.  We will see her back in 1 month.    [name], MD 9/9/202112:30 PM

## 2019-10-20 NOTE — Patient Instructions (Signed)
Lanreotide injection What is this medicine? LANREOTIDE (lan REE oh tide) is used to reduce blood levels of growth hormone in patients with a condition called acromegaly. It also works to slow or stop tumor growth in patients with neuroendocrine tumors and treat carcinoid syndrome. This medicine may be used for other purposes; ask your health care provider or pharmacist if you have questions. COMMON BRAND NAME(S): Somatuline [place] What should I tell my health care provider before I take this medicine? They need to know if you have any of these conditions:  diabetes  gallbladder disease  heart disease  kidney disease  liver disease  thyroid disease  an unusual or allergic reaction to lanreotide, other medicines, foods, dyes, or preservatives  pregnant or trying to get pregnant  breast-feeding How should I use this medicine? This medicine is for injection under the skin. It is given by a health care professional in a hospital or clinic setting. Contact your pediatrician or health care professional regarding the use of this medicine in children. Special care may be needed. Overdosage: If you think you have taken too much of this medicine contact a poison control center or emergency room at once. NOTE: This medicine is only for you. Do not share this medicine with others. What if I miss a dose? It is important not to miss your dose. Call your doctor or health care professional if you are unable to keep an appointment. What may interact with this medicine? This medicine may interact with the following medications:  bromocriptine  cyclosporine  certain medicines for blood pressure, heart disease, irregular heart beat  certain medicines for diabetes  quinidine  terfenadine This list may not describe all possible interactions. Give your health care provider a list of all the medicines, herbs, non-prescription drugs, or dietary supplements you use. Also tell them if you smoke,  drink alcohol, or use illegal drugs. Some items may interact with your medicine. What should I watch for while using this medicine? Tell your doctor or healthcare professional if your symptoms do not start to get better or if they get worse. Visit your doctor or health care professional for regular checks on your progress. Your condition will be monitored carefully while you are receiving this medicine. This medicine may increase blood sugar. Ask your healthcare provider if changes in diet or medicines are needed if you have diabetes. You may need blood work done while you are taking this medicine. Women should inform their doctor if they wish to become pregnant or think they might be pregnant. There is a potential for serious side effects to an unborn child. Talk to your health care professional or pharmacist for more information. Do not breast-feed an infant while taking this medicine or for 6 months after stopping it. This medicine has caused ovarian failure in some women. This medicine may interfere with the ability to have a child. Talk with your doctor or health care professional if you are concerned about your fertility. What side effects may I notice from receiving this medicine? Side effects that you should report to your doctor or health care professional as soon as possible:  allergic reactions like skin rash, itching or hives, swelling of the face, lips, or tongue  increased blood pressure  severe stomach pain  signs and symptoms of hgh blood sugar such as being more thirsty or hungry or having to urinate more than normal. You may also feel very tired or have blurry vision.  signs and symptoms of low blood   sugar such as feeling anxious; confusion; dizziness; increased hunger; unusually weak or tired; sweating; shakiness; cold; irritable; headache; blurred vision; fast heartbeat; loss of consciousness  unusually slow heartbeat Side effects that usually do not require medical  attention (report to your doctor or health care professional if they continue or are bothersome):  constipation  diarrhea  dizziness  headache  muscle pain  muscle spasms  nausea  pain, redness, or irritation at site where injected This list may not describe all possible side effects. Call your doctor for medical advice about side effects. You may report side effects to FDA at 1-800-FDA-1088. Where should I keep my medicine? This drug is given in a hospital or clinic and will not be stored at home. NOTE: This sheet is a summary. It may not cover all possible information. If you have questions about this medicine, talk to your doctor, pharmacist, or health care provider.  2020 [employer]vier/Gold Standard (2017-11-05 09:13:08)  

## 2019-10-21 LAB — CHROMOGRANIN A: Chromogranin A (ng/mL): 150.2 ng/mL — ABNORMAL HIGH (ref 0.0–101.8)

## 2019-10-31 ENCOUNTER — Other Ambulatory Visit: Payer: Self-pay | Admitting: Family Medicine

## 2019-10-31 DIAGNOSIS — I10 Essential (primary) hypertension: Secondary | ICD-10-CM

## 2019-11-02 ENCOUNTER — Encounter: Payer: Self-pay | Admitting: Family Medicine

## 2019-11-02 ENCOUNTER — Ambulatory Visit (INDEPENDENT_AMBULATORY_CARE_PROVIDER_SITE_OTHER): Payer: 59 | Admitting: Family Medicine

## 2019-11-02 ENCOUNTER — Ambulatory Visit: Payer: 59

## 2019-11-02 ENCOUNTER — Other Ambulatory Visit: Payer: Self-pay

## 2019-11-02 VITALS — BP 130/82 | HR 84 | Temp 97.9°F | Ht 59.0 in | Wt 225.6 lb

## 2019-11-02 DIAGNOSIS — E1169 Type 2 diabetes mellitus with other specified complication: Secondary | ICD-10-CM | POA: Diagnosis not present

## 2019-11-02 DIAGNOSIS — Z23 Encounter for immunization: Secondary | ICD-10-CM

## 2019-11-02 DIAGNOSIS — D72829 Elevated white blood cell count, unspecified: Secondary | ICD-10-CM

## 2019-11-02 DIAGNOSIS — E785 Hyperlipidemia, unspecified: Secondary | ICD-10-CM | POA: Diagnosis not present

## 2019-11-02 NOTE — Progress Notes (Signed)
Patient: Leah [name] MRN: [id number] DOB: [birth year] PCP: Orma Flaming, MD     Subjective:  Chief Complaint  Patient presents with  . Diabetes  . Hyperlipidemia    HPI: The patient is a 59 y.o. female who presents today for Diabetes and cholesterol follow up.  Type 2 diabetes I last saw her in November of 2020. Her a1c was 7.3 . She has been on metformin 500mg  BID. She is starting to see healthy weight and wellness for weight loss and is on a weight loss medication. She has lost 5 pounds. She is not exercising. She does try to watch her diet. I also started her on a stain at last visit.  She has had her eye exam done in April and no diabetic retinopathy. she has lost 5 pounds since our last visit.   Hyperlipidemia associated with diabetes -I started her on crestor last visit. She is taking as prescribed with no side effects.   Review of Systems  Respiratory: Negative for cough and shortness of breath.   Cardiovascular: Negative for chest pain and palpitations.  Gastrointestinal: Negative for abdominal pain and nausea.  Genitourinary: Negative for dysuria, flank pain and frequency.    Allergies Patient has No Known Allergies.  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  Surgical History Patient  has a past surgical history that includes Tonsillectomy (1976); Cesarean section (9233,0076); Cholecystectomy (2006); Abdominal hysterectomy  (2004); Oophorectomy (2004); Tubal ligation (Bilateral, 2001); Knee arthroscopy (Left, 09/2010); Breast biopsy (Left); Reconstruction breast w/ latissimus dorsi flap (Bilateral); Laparoscopic hepatectomy; Augmentation mammaplasty; Mastectomy (Bilateral, 2008); and Hernia repair.  Family History Pateint's family history includes Arthritis in her maternal grandfather, maternal grandmother, paternal grandfather, and paternal grandmother; Breast cancer (age of onset: 66) in her maternal aunt; Breast cancer (age of onset: 70) in her mother; Breast cancer (age of onset: 50) in her maternal aunt; Heart failure in her father and paternal grandfather; Hyperlipidemia in her father; Hypertension in her father; Lupus in her mother; Obesity in her father; Ovarian cancer (age of onset: 7) in her maternal aunt; Sudden death in her father.  Social History Patient  reports that she has never smoked. She has never used smokeless tobacco. She reports that she does not drink alcohol and does not use drugs.    Objective: Vitals:   11/02/19 1039  BP: 130/82  Pulse: 84  Temp: 97.9 F (36.6 C)  TempSrc: Temporal  SpO2: 96%  Weight: 225 lb 9.6 oz (102.3 kg)  Height: 4\' 11"  (1.499 m)    Body mass index is 45.57 kg/m.  Physical Exam Vitals reviewed.  Constitutional:      Appearance: Normal appearance. She is obese.  HENT:     Head: Normocephalic and atraumatic.  Cardiovascular:     Rate and Rhythm: Normal rate and regular rhythm.     Heart sounds: Normal heart sounds.  Pulmonary:     Effort: Pulmonary effort is normal.     Breath sounds: Normal breath sounds.  Abdominal:     General: Bowel sounds are normal.     Palpations: Abdomen is soft.  Neurological:  General: No focal deficit present.     Mental Status: She is alert and oriented to person, place, and time.  Psychiatric:        Mood and Affect: Mood normal.        Behavior: Behavior normal.        Assessment/plan: 1. Type 2 diabetes  mellitus with other specified complication, without long-term current use of insulin ([place]) -doing well on metformin -flu shot today -a1c today -doing well on statins.  -f/u in 3-6 months -foot exam at next appointment.  - Hemoglobin A1c; Future  2. Hyperlipidemia associated with type 2 diabetes mellitus ([place])  - Lipid panel; Future  3. Leukocytosis, unspecified type Oncologist just checked cbc and her wbc has trended downwards and near normal. Will not recheck as trending downward and followed by heme/onc.     This visit occurred during the SARS-CoV-2 public health emergency.  Safety protocols were in place, including screening questions prior to the visit, additional usage of staff PPE, and extensive cleaning of exam room while observing appropriate contact time as indicated for disinfecting solutions.     Return in about 6 months (around 05/01/2020) for diabetes/htn/labs/foot exam .   Orma Flaming, MD [place]   11/02/2019

## 2019-11-03 LAB — HEMOGLOBIN A1C
Hgb A1c MFr Bld: 6.8 % of total Hgb — ABNORMAL HIGH (ref <5.7)
Mean Plasma Glucose: 148 (calc)
eAG (mmol/L): 8.2 (calc)

## 2019-11-03 LAB — LIPID PANEL
Cholesterol: 140 mg/dL (ref <200)
HDL: 58 mg/dL (ref >=50)
LDL Cholesterol (Calc): 63 mg/dL (calc)
Non-HDL Cholesterol (Calc): 82 mg/dL (calc) (ref <130)
Total CHOL/HDL Ratio: 2.4 (calc) (ref <5.0)
Triglycerides: 111 mg/dL (ref <150)

## 2019-11-17 ENCOUNTER — Encounter: Payer: Self-pay | Admitting: Family

## 2019-11-17 ENCOUNTER — Inpatient Hospital Stay: Payer: 59 | Attending: Hematology & Oncology

## 2019-11-17 ENCOUNTER — Telehealth: Payer: Self-pay | Admitting: Family

## 2019-11-17 ENCOUNTER — Inpatient Hospital Stay (HOSPITAL_BASED_OUTPATIENT_CLINIC_OR_DEPARTMENT_OTHER): Payer: 59 | Admitting: Family

## 2019-11-17 ENCOUNTER — Inpatient Hospital Stay: Payer: 59

## 2019-11-17 ENCOUNTER — Other Ambulatory Visit: Payer: Self-pay

## 2019-11-17 VITALS — BP 123/80 | HR 78 | Temp 99.0°F | Resp 18 | Ht 59.0 in | Wt 226.1 lb

## 2019-11-17 DIAGNOSIS — C7B02 Secondary carcinoid tumors of liver: Secondary | ICD-10-CM | POA: Insufficient documentation

## 2019-11-17 DIAGNOSIS — C7B8 Other secondary neuroendocrine tumors: Secondary | ICD-10-CM

## 2019-11-17 DIAGNOSIS — C7A00 Malignant carcinoid tumor of unspecified site: Secondary | ICD-10-CM | POA: Insufficient documentation

## 2019-11-17 LAB — CMP (CANCER CENTER ONLY)
ALT: 29 U/L (ref 0–44)
AST: 21 U/L (ref 15–41)
Albumin: 3.9 g/dL (ref 3.5–5.0)
Alkaline Phosphatase: 58 U/L (ref 38–126)
Anion gap: 10 (ref 5–15)
BUN: 22 mg/dL — ABNORMAL HIGH (ref 6–20)
CO2: 28 mmol/L (ref 22–32)
Calcium: 9.5 mg/dL (ref 8.9–10.3)
Chloride: 101 mmol/L (ref 98–111)
Creatinine: 0.81 mg/dL (ref 0.44–1.00)
GFR, Estimated: >60 mL/min (ref >60)
Glucose, Bld: 182 mg/dL — ABNORMAL HIGH (ref 70–99)
Potassium: 4.2 mmol/L (ref 3.5–5.1)
Sodium: 139 mmol/L (ref 135–145)
Total Bilirubin: 0.6 mg/dL (ref 0.3–1.2)
Total Protein: 6.7 g/dL (ref 6.5–8.1)

## 2019-11-17 LAB — CBC WITH DIFFERENTIAL (CANCER CENTER ONLY)
Abs Immature Granulocytes: 0.1 10*3/uL — ABNORMAL HIGH (ref 0.00–0.07)
Basophils Absolute: 0.1 10*3/uL (ref 0.0–0.1)
Basophils Relative: 0 %
Eosinophils Absolute: 0.1 10*3/uL (ref 0.0–0.5)
Eosinophils Relative: 1 %
HCT: 39.4 % (ref 36.0–46.0)
Hemoglobin: 12.8 g/dL (ref 12.0–15.0)
Immature Granulocytes: 1 %
Lymphocytes Relative: 26 %
Lymphs Abs: 4.6 10*3/uL — ABNORMAL HIGH (ref 0.7–4.0)
MCH: 30 pg (ref 26.0–34.0)
MCHC: 32.5 g/dL (ref 30.0–36.0)
MCV: 92.3 fL (ref 80.0–100.0)
Monocytes Absolute: 1.3 10*3/uL — ABNORMAL HIGH (ref 0.1–1.0)
Monocytes Relative: 8 %
Neutro Abs: 11.2 10*3/uL — ABNORMAL HIGH (ref 1.7–7.7)
Neutrophils Relative %: 64 %
Platelet Count: 287 10*3/uL (ref 150–400)
RBC: 4.27 MIL/uL (ref 3.87–5.11)
RDW: 13.8 % (ref 11.5–15.5)
WBC Count: 17.3 10*3/uL — ABNORMAL HIGH (ref 4.0–10.5)
nRBC: 0 % (ref 0.0–0.2)

## 2019-11-17 LAB — LACTATE DEHYDROGENASE: LDH: 261 U/L — ABNORMAL HIGH (ref 98–192)

## 2019-11-17 MED ORDER — LANREOTIDE ACETATE 120 MG/0.5ML ~~LOC~~ SOLN
120.0000 mg | Freq: Once | SUBCUTANEOUS | Status: AC
  Administered 2019-11-17: 120 mg via SUBCUTANEOUS

## 2019-11-17 NOTE — Patient Instructions (Signed)
Lanreotide injection What is this medicine? LANREOTIDE (lan REE oh tide) is used to reduce blood levels of growth hormone in patients with a condition called acromegaly. It also works to slow or stop tumor growth in patients with neuroendocrine tumors and treat carcinoid syndrome. This medicine may be used for other purposes; ask your health care provider or pharmacist if you have questions. COMMON BRAND NAME(S): Somatuline [place] What should I tell my health care provider before I take this medicine? They need to know if you have any of these conditions:  diabetes  gallbladder disease  heart disease  kidney disease  liver disease  thyroid disease  an unusual or allergic reaction to lanreotide, other medicines, foods, dyes, or preservatives  pregnant or trying to get pregnant  breast-feeding How should I use this medicine? This medicine is for injection under the skin. It is given by a health care professional in a hospital or clinic setting. Contact your pediatrician or health care professional regarding the use of this medicine in children. Special care may be needed. Overdosage: If you think you have taken too much of this medicine contact a poison control center or emergency room at once. NOTE: This medicine is only for you. Do not share this medicine with others. What if I miss a dose? It is important not to miss your dose. Call your doctor or health care professional if you are unable to keep an appointment. What may interact with this medicine? This medicine may interact with the following medications:  bromocriptine  cyclosporine  certain medicines for blood pressure, heart disease, irregular heart beat  certain medicines for diabetes  quinidine  terfenadine This list may not describe all possible interactions. Give your health care provider a list of all the medicines, herbs, non-prescription drugs, or dietary supplements you use. Also tell them if you smoke,  drink alcohol, or use illegal drugs. Some items may interact with your medicine. What should I watch for while using this medicine? Tell your doctor or healthcare professional if your symptoms do not start to get better or if they get worse. Visit your doctor or health care professional for regular checks on your progress. Your condition will be monitored carefully while you are receiving this medicine. This medicine may increase blood sugar. Ask your healthcare provider if changes in diet or medicines are needed if you have diabetes. You may need blood work done while you are taking this medicine. Women should inform their doctor if they wish to become pregnant or think they might be pregnant. There is a potential for serious side effects to an unborn child. Talk to your health care professional or pharmacist for more information. Do not breast-feed an infant while taking this medicine or for 6 months after stopping it. This medicine has caused ovarian failure in some women. This medicine may interfere with the ability to have a child. Talk with your doctor or health care professional if you are concerned about your fertility. What side effects may I notice from receiving this medicine? Side effects that you should report to your doctor or health care professional as soon as possible:  allergic reactions like skin rash, itching or hives, swelling of the face, lips, or tongue  increased blood pressure  severe stomach pain  signs and symptoms of hgh blood sugar such as being more thirsty or hungry or having to urinate more than normal. You may also feel very tired or have blurry vision.  signs and symptoms of low blood   sugar such as feeling anxious; confusion; dizziness; increased hunger; unusually weak or tired; sweating; shakiness; cold; irritable; headache; blurred vision; fast heartbeat; loss of consciousness  unusually slow heartbeat Side effects that usually do not require medical  attention (report to your doctor or health care professional if they continue or are bothersome):  constipation  diarrhea  dizziness  headache  muscle pain  muscle spasms  nausea  pain, redness, or irritation at site where injected This list may not describe all possible side effects. Call your doctor for medical advice about side effects. You may report side effects to FDA at 1-800-FDA-1088. Where should I keep my medicine? This drug is given in a hospital or clinic and will not be stored at home. NOTE: This sheet is a summary. It may not cover all possible information. If you have questions about this medicine, talk to your doctor, pharmacist, or health care provider.  2020 [employer]vier/Gold Standard (2017-11-05 09:13:08)  

## 2019-11-17 NOTE — Progress Notes (Signed)
Hematology and Oncology Follow Up Visit  Leah [name] [id number] [birth year] 59 y.o. 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 She had a wonderful time in [place] for her brother's wedding.   Chromogranin A level was elevated at 150.2.  She follows up with her team at [facility] later this month and will have her routine MRI for surveillance at that time.  She denies fever, chills, n/v, cough, rash, dizziness, SOB, chest pain, palpitations, abdominal pain or changes in bowel or bladder habits.  No swelling, tenderness, numbness or tingling in her extremities at this time.  No falls or syncope.  No abnormal blood loss. No excessive, unusual bruising or petechiae.  She has maintained a good appetite and is staying well hydrated. Her weight is stable.   ECOG Performance Status: 1 - Symptomatic but completely ambulatory  Medications:  Allergies as of 11/17/2019   No Known Allergies     Medication List       Accurate as of November 17, 2019 12:05 PM. If you have any questions, ask your nurse or doctor.        amLODipine 10 MG tablet Commonly known as: NORVASC TAKE 1 TABLET BY MOUTH EVERY DAY   aspirin 81 MG EC tablet TAKE 1 TABLET BY MOUTH EVERY DAY *SWALLOW WHOLE*   clonazePAM 0.5 MG tablet Commonly known as: KLONOPIN clonazepam 0.5 mg tablet   fexofenadine 180 MG tablet Commonly known as: ALLEGRA TAKE 1 TABLET BY MOUTH EVERY DAY   gabapentin 100 MG capsule Commonly known as: NEURONTIN TAKE 1 CAPSULE (100 MG TOTAL) BY MOUTH AT BEDTIME.   ketoconazole 2 % cream Commonly known as: NIZORAL ketoconazole 2 % topical cream   metFORMIN 500 MG tablet Commonly known as: GLUCOPHAGE Take 1 tablet (500 mg total) by mouth 2 (two)  times daily with a meal.   metoprolol succinate 100 MG 24 hr tablet Commonly known as: TOPROL-XL TAKE 1 TABLET BY MOUTH EVERY EVENING WITH OR IMMEDIATELY FOLLOWING A MEAL   montelukast 10 MG tablet Commonly known as: SINGULAIR TAKE 1 TABLET BY MOUTH EVERYDAY AT BEDTIME   moxifloxacin 0.5 % ophthalmic solution Commonly known as: VIGAMOX   naproxen 500 MG tablet Commonly known as: NAPROSYN TAKE 1 TABLET TWICE A DAY WITH A MEAL   octreotide 30 MG injection Commonly known as: SANDOSTATIN LAR Inject 30 mg into the muscle every 28 (twenty-eight) days.   ondansetron 4 MG disintegrating tablet Commonly known as: ZOFRAN-ODT Take 1 tablet (4 mg total) by mouth every 8 (eight) hours as needed for nausea or vomiting.   Osphena 60 MG Tabs Generic drug: Ospemifene Take 1 tablet by mouth daily.   oxyCODONE 5 MG immediate release tablet Commonly known as: Oxy IR/ROXICODONE oxycodone 5 mg tablet   pantoprazole 40 MG tablet Commonly known as: PROTONIX TAKE 1 TABLET BY MOUTH TWICE A DAY   ProAir HFA 108 (90 Base) MCG/ACT inhaler Generic drug: albuterol INHALE 2 PUFFS INTO THE LUNGS EVERY 6 (SIX) HOURS AS NEEDED. FOR SHORTNESS OF BREATH. What changed: See the new instructions.   Qsymia 7.5-46 MG Cp24 Generic drug: Phentermine-Topiramate Qsymia 7.5 mg-46 mg capsule, extended release   rosuvastatin 10 MG tablet Commonly known as: Crestor Take 1 tablet (10 mg total) by mouth daily.  valACYclovir 1000 MG tablet Commonly known as: VALTREX Take by mouth. What changed: Another medication with the same name was removed. Continue taking this medication, and follow the directions you see here. Changed by: Laverna Peace, NP   valsartan 320 MG tablet Commonly known as: DIOVAN TAKE 1 TABLET BY MOUTH EVERY DAY   venlafaxine XR 150 MG 24 hr capsule Commonly known as: EFFEXOR-XR TAKE 1 CAPSULE (150 MG TOTAL) BY MOUTH EACH MORNING   Xerese 5-1 % Crea Generic drug:  Acyclovir-Hydrocortisone Xerese 5 %-1 % topical cream       Allergies: No Known Allergies  Past Medical History, Surgical history, Social history, and Family History were reviewed and updated.  Review of Systems: All other 10 point review of systems is negative.   Physical Exam:  height is 4\' 11"  (1.499 m) and weight is 226 lb 1.9 oz (102.6 kg). Her oral temperature is 99 F (37.2 C). Her blood pressure is 123/80 and her pulse is 78. Her respiration is 18 and oxygen saturation is 100%.   Wt Readings from Last 3 Encounters:  11/17/19 226 lb 1.9 oz (102.6 kg)  11/02/19 225 lb 9.6 oz (102.3 kg)  10/20/19 226 lb 12 oz (102.9 kg)    Ocular: Sclerae unicteric, pupils equal, round and reactive to light Ear-nose-throat: Oropharynx clear, dentition fair Lymphatic: No cervical or supraclavicular adenopathy Lungs no rales or rhonchi, good excursion bilaterally Heart regular rate and rhythm, no murmur appreciated Abd soft, nontender, positive bowel sounds MSK no focal spinal tenderness, no joint edema Neuro: non-focal, well-oriented, appropriate affect Breasts: Deferred   Lab Results  Component Value Date   WBC 17.3 (H) 11/17/2019   HGB 12.8 11/17/2019   HCT 39.4 11/17/2019   MCV 92.3 11/17/2019   PLT 287 11/17/2019   Lab Results  Component Value Date   FERRITIN 382 (H) 10/18/2008   IRON <10 (L) 10/18/2008   TIBC NOT CALC Not calculated due to Iron <10. 10/18/2008   UIBC 156 10/18/2008   IRONPCTSAT NOT CALC Not calculated due to Iron <10. 10/18/2008   Lab Results  Component Value Date   RETICCTPCT 0.9 10/18/2008   RBC 4.27 11/17/2019   No results found for: KPAFRELGTCHN, LAMBDASER, KAPLAMBRATIO No results found for: IGGSERUM, IGA, IGMSERUM No results found for: Odetta Pink, SPEI   Chemistry      Component Value Date/Time   NA 139 10/20/2019 1115   NA 140 01/20/2017 1416   NA 141 01/17/2016 1047   K 3.9  10/20/2019 1115   K 3.9 01/20/2017 1416   K 3.2 (L) 01/17/2016 1047   CL 105 10/20/2019 1115   CL 102 01/20/2017 1416   CO2 25 10/20/2019 1115   CO2 26 01/20/2017 1416   CO2 27 01/17/2016 1047   BUN 20 10/20/2019 1115   BUN 15 01/20/2017 1416   BUN 6.6 (L) 01/17/2016 1047   CREATININE 0.74 10/20/2019 1115   CREATININE 1.1 01/20/2017 1416   CREATININE 0.7 01/17/2016 1047      Component Value Date/Time   CALCIUM 9.2 10/20/2019 1115   CALCIUM 9.1 01/20/2017 1416   CALCIUM 8.7 01/17/2016 1047   ALKPHOS 65 10/20/2019 1115   ALKPHOS 84 01/20/2017 1416   ALKPHOS 90 01/17/2016 1047   AST 22 10/20/2019 1115   AST 34 01/17/2016 1047   ALT 24 10/20/2019 1115   ALT 35 01/20/2017 1416   ALT 38 01/17/2016 1047   BILITOT 0.5 10/20/2019 1115   BILITOT  0.42 01/17/2016 1047       Impression and Plan: Leah [name] is a very pleasant 59 yo caucasian female withlong standing history ofmetastaticcarcinoidwith recurrent liver metastasis. Her Chromogranin A level last month was up at 150. We have repeated this today and she has an appointment with Dr. Crisoforo Oxford for follow-up and repeat MRI or the liver.  We will proceed with Somatuline injection today as planned.  Follow-up in 1 month.  She can contact our office with any questions or concerns.   Laverna Peace, NP 10/7/202112:05 PM

## 2019-11-17 NOTE — Telephone Encounter (Signed)
Appointments scheduled per 10/7 los/ patient has My Chart Access for appt info

## 2019-11-18 LAB — CHROMOGRANIN A: Chromogranin A (ng/mL): 99.3 ng/mL (ref 0.0–101.8)

## 2019-11-22 ENCOUNTER — Other Ambulatory Visit: Payer: Self-pay | Admitting: Family Medicine

## 2019-11-24 ENCOUNTER — Other Ambulatory Visit: Payer: Self-pay | Admitting: Family Medicine

## 2019-11-26 ENCOUNTER — Other Ambulatory Visit: Payer: Self-pay | Admitting: Family Medicine

## 2019-11-28 ENCOUNTER — Other Ambulatory Visit: Payer: Self-pay | Admitting: Family Medicine

## 2019-11-28 DIAGNOSIS — M4722 Other spondylosis with radiculopathy, cervical region: Secondary | ICD-10-CM

## 2019-12-01 ENCOUNTER — Other Ambulatory Visit: Payer: Self-pay | Admitting: Family Medicine

## 2019-12-01 DIAGNOSIS — M4722 Other spondylosis with radiculopathy, cervical region: Secondary | ICD-10-CM

## 2019-12-07 ENCOUNTER — Other Ambulatory Visit: Payer: Self-pay | Admitting: Family Medicine

## 2019-12-07 MED ORDER — ONDANSETRON HCL 4 MG PO TABS: 4.0000 mg | ORAL_TABLET | Freq: Three times a day (TID) | ORAL | 1 refills | Status: DC | PRN

## 2019-12-07 MED ORDER — ONDANSETRON HCL 4 MG PO TABS: 4.0000 mg | ORAL_TABLET | Freq: Three times a day (TID) | ORAL | 0 refills | Status: DC | PRN

## 2019-12-12 ENCOUNTER — Ambulatory Visit (INDEPENDENT_AMBULATORY_CARE_PROVIDER_SITE_OTHER): Payer: 59 | Admitting: Family Medicine

## 2019-12-12 ENCOUNTER — Other Ambulatory Visit: Payer: Self-pay

## 2019-12-12 ENCOUNTER — Encounter (INDEPENDENT_AMBULATORY_CARE_PROVIDER_SITE_OTHER): Payer: Self-pay | Admitting: Family Medicine

## 2019-12-12 VITALS — BP 147/85 | HR 81 | Temp 98.6°F | Ht 59.0 in | Wt 222.0 lb

## 2019-12-12 DIAGNOSIS — E1169 Type 2 diabetes mellitus with other specified complication: Secondary | ICD-10-CM

## 2019-12-12 DIAGNOSIS — Z9189 Other specified personal risk factors, not elsewhere classified: Secondary | ICD-10-CM

## 2019-12-12 DIAGNOSIS — F3289 Other specified depressive episodes: Secondary | ICD-10-CM

## 2019-12-12 DIAGNOSIS — Z6841 Body Mass Index (BMI) 40.0 and over, adult: Secondary | ICD-10-CM

## 2019-12-12 DIAGNOSIS — I152 Hypertension secondary to endocrine disorders: Secondary | ICD-10-CM

## 2019-12-12 DIAGNOSIS — E1159 Type 2 diabetes mellitus with other circulatory complications: Secondary | ICD-10-CM

## 2019-12-12 DIAGNOSIS — E785 Hyperlipidemia, unspecified: Secondary | ICD-10-CM

## 2019-12-12 NOTE — Telephone Encounter (Signed)
Dr Wallace pt °

## 2019-12-13 MED ORDER — OZEMPIC (0.25 OR 0.5 MG/DOSE) 2 MG/1.5ML ~~LOC~~ SOPN: 0.5000 mg | PEN_INJECTOR | SUBCUTANEOUS | 0 refills | Status: DC

## 2019-12-13 NOTE — Progress Notes (Signed)
Chief Complaint:   OBESITY Leah [name] is here to discuss her progress with her obesity treatment plan along with follow-up of her obesity related diagnoses.   Today's visit was #: 6 Starting weight: 226 lbs Starting date: 07/07/2019 Today's weight: 222 lbs Today's date: 12/12/2019 Total lbs lost to date: 4 lbs Body mass index is 44.84 kg/m.  Total weight loss percentage to date: -1.77%  Interim History: Leah [name] says she has had family visiting over the past week. Nutrition Plan: the Category 2 Plan for 10% of the time.  Activity: None. Sleep: Sleep is restful.   Assessment/Plan:   1. Type 2 diabetes mellitus with other specified complication, without long-term current use of insulin (HCC) Diabetes Mellitus: needs improvement. Medication: Metformin 500 mg twice daily. Issues reviewed with her: blood sugar goals, complications of diabetes mellitus, hypoglycemia prevention and treatment, exercise, nutrition. After discussion, patient would like to start below medication. Expectations, risks, and potential side effects reviewed.   Lab Results  Component Value Date   HGBA1C 6.8 (H) 11/02/2019   HGBA1C 7.3 (H) 07/06/2019   HGBA1C 7.1 (H) 12/24/2018   Lab Results  Component Value Date   MICROALBUR 0.9 07/06/2019   LDLCALC 63 11/02/2019   CREATININE 0.81 11/17/2019   - Start Semaglutide,0.25 or 0.5MG /DOS, (OZEMPIC, 0.25 OR 0.5 MG/DOSE,) 2 MG/1.5ML SOPN; Inject 0.5 mg into the skin once a week.  Dispense: 1.5 mL; Refill: 0  2. Hypertension associated with type 2 diabetes mellitus ([place]) Not at goal.  Elevated today.  Medications: metoprolol, valsartan. Plan: Avoid buying foods that are: processed, frozen, or prepackaged to avoid excess salt. We will continue to monitor symptoms as they relate to her weight loss journey.  BP Readings from Last 3 Encounters:  12/12/19 (!) 147/85  11/17/19 123/80  11/02/19 130/82   Lab Results  Component Value Date   CREATININE 0.81 11/17/2019    3. Hyperlipidemia associated with type 2 diabetes mellitus ([place]) Plan: Dietary changes: Increase soluble fiber. Decrease simple carbohydrates. Exercise changes: An average 40 minutes of moderate to vigorous-intensity aerobic activity 3 or 4 times per week. Lipid-lowering medications: Crestor 10 mg daily.   Lab Results  Component Value Date   CHOL 140 11/02/2019   HDL 58 11/02/2019   LDLCALC 63 11/02/2019   TRIG 111 11/02/2019   CHOLHDL 2.4 11/02/2019   Lab Results  Component Value Date   ALT 29 11/17/2019   AST 21 11/17/2019   ALKPHOS 58 11/17/2019   BILITOT 0.6 11/17/2019   The 10-year ASCVD risk score Mikey Bussing DC Jr., et al., 2013) is: 8.1%   Values used to calculate the score:     Age: 59 years     Sex: Female     Is Non-Hispanic African American: No     Diabetic: Yes     Tobacco smoker: No     Systolic Blood Pressure: 606 mmHg     Is BP treated: Yes     HDL Cholesterol: 58 mg/dL     Total Cholesterol: 140 mg/dL  4. Other depression with emotional eating Improving, but not optimized.  Discussed cues and consequences, how thoughts affect eating, model of thoughts, feelings, and behaviors, and strategies for change by focusing on the cue. Discussed cognitive distortions, coping thoughts, and how to change your thoughts.  5. At risk for nausea Leah [name] was given approximately 9 minutes of nausea prevention counseling today. Leah [name] is at risk for nausea due to taking Ozempic. She was encouraged to titrate  her medication slowly, make sure to stay hydrated, eat smaller portions throughout the day, and avoid high fat meals.   6. Class 3 severe obesity with serious comorbidity and body mass index (BMI) of 40.0 to 44.9 in adult, unspecified obesity type (HCC)  Leah [name] is currently in the action stage of change. As such, her goal is to continue with weight loss efforts.   Nutrition goals: She has agreed to the Category 2 Plan.   Exercise goals: For substantial health  benefits, adults should do at least 150 minutes (2 hours and 30 minutes) a week of moderate-intensity, or 75 minutes (1 hour and 15 minutes) a week of vigorous-intensity aerobic physical activity, or an equivalent combination of moderate- and vigorous-intensity aerobic activity. Aerobic activity should be performed in episodes of at least 10 minutes, and preferably, it should be spread throughout the week.  Behavioral modification strategies: increasing lean protein intake, decreasing simple carbohydrates, increasing vegetables, increasing water intake and emotional eating strategies.  Leah [name] has agreed to follow-up with our clinic in 3 weeks. She was informed of the importance of frequent follow-up visits to maximize her success with intensive lifestyle modifications for her multiple health conditions.   Objective:   Blood pressure (!) 147/85, pulse 81, temperature 98.6 F (37 C), temperature source Oral, height 4\' 11"  (1.499 m), weight 222 lb (100.7 kg), SpO2 97 %. Body mass index is 44.84 kg/m.  General: Cooperative, alert, well developed, in no acute distress. HEENT: Conjunctivae and lids unremarkable. Cardiovascular: Regular rhythm.  Lungs: Normal work of breathing. Neurologic: No focal deficits.   Lab Results  Component Value Date   CREATININE 0.81 11/17/2019   BUN 22 (H) 11/17/2019   NA 139 11/17/2019   K 4.2 11/17/2019   CL 101 11/17/2019   CO2 28 11/17/2019   Lab Results  Component Value Date   ALT 29 11/17/2019   AST 21 11/17/2019   ALKPHOS 58 11/17/2019   BILITOT 0.6 11/17/2019   Lab Results  Component Value Date   HGBA1C 6.8 (H) 11/02/2019   HGBA1C 7.3 (H) 07/06/2019   HGBA1C 7.1 (H) 12/24/2018   HGBA1C 8.1 (A) 08/31/2018   HGBA1C 6.5 (H) 11/30/2017   Lab Results  Component Value Date   TSH 1.69 07/06/2019   Lab Results  Component Value Date   CHOL 140 11/02/2019   HDL 58 11/02/2019   LDLCALC 63 11/02/2019   TRIG 111 11/02/2019   CHOLHDL 2.4 11/02/2019    Lab Results  Component Value Date   WBC 17.3 (H) 11/17/2019   HGB 12.8 11/17/2019   HCT 39.4 11/17/2019   MCV 92.3 11/17/2019   PLT 287 11/17/2019   Lab Results  Component Value Date   IRON <10 (L) 10/18/2008   TIBC NOT CALC Not calculated due to Iron <10. 10/18/2008   FERRITIN 382 (H) 10/18/2008   Attestation Statements:   Reviewed by clinician on day of visit: allergies, medications, problem list, medical history, surgical history, family history, social history, and previous encounter notes.  I, Water quality scientist, CMA, am acting as transcriptionist for Briscoe Deutscher, DO  I have reviewed the above documentation for accuracy and completeness, and I agree with the above. Briscoe Deutscher, DO

## 2019-12-18 ENCOUNTER — Other Ambulatory Visit: Payer: Self-pay | Admitting: Family Medicine

## 2019-12-19 ENCOUNTER — Other Ambulatory Visit: Payer: Self-pay

## 2019-12-19 ENCOUNTER — Inpatient Hospital Stay: Payer: 59 | Attending: Hematology & Oncology

## 2019-12-19 ENCOUNTER — Inpatient Hospital Stay: Payer: 59

## 2019-12-19 ENCOUNTER — Encounter: Payer: Self-pay | Admitting: Hematology & Oncology

## 2019-12-19 ENCOUNTER — Inpatient Hospital Stay (HOSPITAL_BASED_OUTPATIENT_CLINIC_OR_DEPARTMENT_OTHER): Payer: 59 | Admitting: Hematology & Oncology

## 2019-12-19 VITALS — BP 155/102 | HR 104 | Temp 99.8°F | Resp 18 | Wt 220.0 lb

## 2019-12-19 DIAGNOSIS — C7B02 Secondary carcinoid tumors of liver: Secondary | ICD-10-CM | POA: Diagnosis not present

## 2019-12-19 DIAGNOSIS — C7B8 Other secondary neuroendocrine tumors: Secondary | ICD-10-CM

## 2019-12-19 DIAGNOSIS — C7B00 Secondary carcinoid tumors, unspecified site: Secondary | ICD-10-CM | POA: Insufficient documentation

## 2019-12-19 DIAGNOSIS — C7A00 Malignant carcinoid tumor of unspecified site: Secondary | ICD-10-CM | POA: Diagnosis present

## 2019-12-19 LAB — CBC WITH DIFFERENTIAL (CANCER CENTER ONLY)
Abs Immature Granulocytes: 0.08 10*3/uL — ABNORMAL HIGH (ref 0.00–0.07)
Basophils Absolute: 0.1 10*3/uL (ref 0.0–0.1)
Basophils Relative: 0 %
Eosinophils Absolute: 0 10*3/uL (ref 0.0–0.5)
Eosinophils Relative: 0 %
HCT: 40.9 % (ref 36.0–46.0)
Hemoglobin: 13.3 g/dL (ref 12.0–15.0)
Immature Granulocytes: 1 %
Lymphocytes Relative: 18 %
Lymphs Abs: 2.6 10*3/uL (ref 0.7–4.0)
MCH: 30 pg (ref 26.0–34.0)
MCHC: 32.5 g/dL (ref 30.0–36.0)
MCV: 92.3 fL (ref 80.0–100.0)
Monocytes Absolute: 0.9 10*3/uL (ref 0.1–1.0)
Monocytes Relative: 6 %
Neutro Abs: 11.1 10*3/uL — ABNORMAL HIGH (ref 1.7–7.7)
Neutrophils Relative %: 75 %
Platelet Count: 271 10*3/uL (ref 150–400)
RBC: 4.43 MIL/uL (ref 3.87–5.11)
RDW: 14.4 % (ref 11.5–15.5)
WBC Count: 14.8 10*3/uL — ABNORMAL HIGH (ref 4.0–10.5)
nRBC: 0 % (ref 0.0–0.2)

## 2019-12-19 LAB — CMP (CANCER CENTER ONLY)
ALT: 27 U/L (ref 0–44)
AST: 26 U/L (ref 15–41)
Albumin: 4 g/dL (ref 3.5–5.0)
Alkaline Phosphatase: 64 U/L (ref 38–126)
Anion gap: 9 (ref 5–15)
BUN: 15 mg/dL (ref 6–20)
CO2: 30 mmol/L (ref 22–32)
Calcium: 9.8 mg/dL (ref 8.9–10.3)
Chloride: 101 mmol/L (ref 98–111)
Creatinine: 0.95 mg/dL (ref 0.44–1.00)
GFR, Estimated: >60 mL/min (ref >60)
Glucose, Bld: 147 mg/dL — ABNORMAL HIGH (ref 70–99)
Potassium: 4.3 mmol/L (ref 3.5–5.1)
Sodium: 140 mmol/L (ref 135–145)
Total Bilirubin: 0.6 mg/dL (ref 0.3–1.2)
Total Protein: 7.1 g/dL (ref 6.5–8.1)

## 2019-12-19 LAB — LACTATE DEHYDROGENASE: LDH: 263 U/L — ABNORMAL HIGH (ref 98–192)

## 2019-12-19 MED ORDER — LANREOTIDE ACETATE 120 MG/0.5ML ~~LOC~~ SOLN
120.0000 mg | Freq: Once | SUBCUTANEOUS | Status: AC
  Administered 2019-12-19: 120 mg via SUBCUTANEOUS

## 2019-12-19 MED ORDER — LANREOTIDE ACETATE 120 MG/0.5ML ~~LOC~~ SOLN
SUBCUTANEOUS | Status: AC
  Filled 2019-12-19: qty 120

## 2019-12-19 NOTE — Patient Instructions (Signed)
Pt discharged in no apparent distress. Pt left ambulatory without assistance. Pt aware of discharge instructions and verbalized understanding and had no further questions.  

## 2019-12-19 NOTE — Progress Notes (Signed)
Hematology and Oncology Follow Up Visit  AERICA RINCON [id number] 03-31-1960 59 y.o. 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 Goller is here today for follow-up and Somatuline injection.  She feels okay.  She really has no specific complaints.  She was sick last night.  She has had episode of vomiting.  I am unsure exactly what happened.  She has her MRI at [facility] later on this week.  Her last chromogranin A level was 99.  She has had no rashes.  She has had no issues with diarrhea.  There is been no leg swelling.  As always, she is busy with her family.  Currently, I would say performance status is probably ECOG 1.    Medications:  Allergies as of 12/19/2019   No Known Allergies     Medication List       Accurate as of December 19, 2019  5:38 PM. If you have any questions, ask your nurse or doctor.        amLODipine 10 MG tablet Commonly known as: NORVASC TAKE 1 TABLET BY MOUTH EVERY DAY   aspirin 81 MG EC tablet TAKE 1 TABLET BY MOUTH EVERY DAY *SWALLOW WHOLE*   clonazePAM 0.5 MG tablet Commonly known as: KLONOPIN TAKE 1 TABLET BY MOUTH TWICE A DAY AS NEEDED FOR ANXIETY   fexofenadine 180 MG tablet Commonly known as: ALLEGRA TAKE 1 TABLET BY MOUTH EVERY DAY   gabapentin 100 MG capsule Commonly known as: NEURONTIN TAKE 1 CAPSULE (100 MG TOTAL) BY MOUTH AT BEDTIME.   ketoconazole 2 % cream Commonly known as: NIZORAL ketoconazole 2 % topical cream   metFORMIN 500 MG tablet Commonly known as: GLUCOPHAGE Take 1 tablet (500 mg total) by mouth 2 (two) times daily with a meal.   metoprolol succinate 100 MG 24 hr tablet Commonly known as: TOPROL-XL TAKE 1 TABLET BY MOUTH EVERY EVENING WITH OR IMMEDIATELY FOLLOWING A MEAL   montelukast 10 MG tablet Commonly known as:  SINGULAIR TAKE 1 TABLET BY MOUTH EVERYDAY AT BEDTIME   moxifloxacin 0.5 % ophthalmic solution Commonly known as: VIGAMOX   naproxen 500 MG tablet Commonly known as: NAPROSYN TAKE 1 TABLET TWICE A DAY WITH A MEAL   octreotide 30 MG injection Commonly known as: SANDOSTATIN LAR Inject 30 mg into the muscle every 28 (twenty-eight) days.   ondansetron 4 MG tablet Commonly known as: ZOFRAN TAKE 1 TABLET BY MOUTH EVERY 8 HOURS AS NEEDED FOR NAUSEA OR VOMITING   Osphena 60 MG Tabs Generic drug: Ospemifene Take 1 tablet by mouth daily.   oxyCODONE 5 MG immediate release tablet Commonly known as: Oxy IR/ROXICODONE oxycodone 5 mg tablet   Ozempic (0.25 or 0.5 MG/DOSE) 2 MG/1.5ML Sopn Generic drug: Semaglutide(0.25 or 0.5MG /DOS) Inject 0.5 mg into the skin once a week.   pantoprazole 40 MG tablet Commonly known as: PROTONIX TAKE 1 TABLET BY MOUTH TWICE A DAY   ProAir HFA 108 (90 Base) MCG/ACT inhaler Generic drug: albuterol INHALE 2 PUFFS INTO THE LUNGS EVERY 6 (SIX) HOURS AS NEEDED. FOR SHORTNESS OF BREATH. What changed: See the new instructions.   rosuvastatin 10 MG tablet Commonly known as: Crestor Take 1 tablet (10 mg total) by mouth daily.   valACYclovir 1000 MG tablet Commonly known as: VALTREX Take by mouth.   valsartan 320 MG tablet Commonly known  as: DIOVAN TAKE 1 TABLET BY MOUTH EVERY DAY   venlafaxine XR 150 MG 24 hr capsule Commonly known as: EFFEXOR-XR TAKE 1 CAPSULE (150 MG TOTAL) BY MOUTH EACH MORNING   Xerese 5-1 % Crea Generic drug: Acyclovir-Hydrocortisone Xerese 5 %-1 % topical cream       Allergies: No Known Allergies  Past Medical History, Surgical history, Social history, and Family History were reviewed and updated.  Review of Systems: Review of Systems  Constitutional: Negative.   HENT: Negative.   Eyes: Negative.   Respiratory: Negative.   Cardiovascular: Negative.   Gastrointestinal: Negative.   Genitourinary: Negative.    Musculoskeletal: Negative.   Skin: Negative.   Neurological: Negative.   Endo/Heme/Allergies: Negative.   Psychiatric/Behavioral: Negative.      Physical Exam:  weight is 220 lb (99.8 kg). Her oral temperature is 99.8 F (37.7 C). Her blood pressure is 155/102 (abnormal) and her pulse is 104 (abnormal). Her respiration is 18 and oxygen saturation is 99%.   Wt Readings from Last 3 Encounters:  12/19/19 220 lb (99.8 kg)  12/12/19 222 lb (100.7 kg)  11/17/19 226 lb 1.9 oz (102.6 kg)    Physical Exam Vitals reviewed.  HENT:     Head: Normocephalic and atraumatic.  Eyes:     Pupils: Pupils are equal, round, and reactive to light.  Cardiovascular:     Rate and Rhythm: Normal rate and regular rhythm.     Heart sounds: Normal heart sounds.  Pulmonary:     Effort: Pulmonary effort is normal.     Breath sounds: Normal breath sounds.  Abdominal:     General: Bowel sounds are normal.     Palpations: Abdomen is soft.     Comments: Abdominal exam shows an obese abdomen that is soft.  She has good bowel sounds.  There is no fluid wave.  She has laparotomy scars that are well-healed.  There is no tenderness.  There is no guarding or rebound.  There is no palpable liver or spleen tip.  Musculoskeletal:        General: No tenderness or deformity. Normal range of motion.     Cervical back: Normal range of motion.  Lymphadenopathy:     Cervical: No cervical adenopathy.  Skin:    General: Skin is warm and dry.     Findings: No erythema or rash.  Neurological:     Mental Status: She is alert and oriented to person, place, and time.  Psychiatric:        Behavior: Behavior normal.        Thought Content: Thought content normal.        Judgment: Judgment normal.      Lab Results  Component Value Date   WBC 14.8 (H) 12/19/2019   HGB 13.3 12/19/2019   HCT 40.9 12/19/2019   MCV 92.3 12/19/2019   PLT 271 12/19/2019   Lab Results  Component Value Date   FERRITIN 382 (H) 10/18/2008    IRON <10 (L) 10/18/2008   TIBC NOT CALC Not calculated due to Iron <10. 10/18/2008   UIBC 156 10/18/2008   IRONPCTSAT NOT CALC Not calculated due to Iron <10. 10/18/2008   Lab Results  Component Value Date   RETICCTPCT 0.9 10/18/2008   RBC 4.43 12/19/2019   No results found for: KPAFRELGTCHN, LAMBDASER, KAPLAMBRATIO No results found for: IGGSERUM, IGA, IGMSERUM No results found for: TOTALPROTELP, ALBUMINELP, A1GS, A2GS, BETS, BETA2SER, GAMS, MSPIKE, SPEI   Chemistry      Component  Value Date/Time   NA 140 12/19/2019 1401   NA 140 01/20/2017 1416   NA 141 01/17/2016 1047   K 4.3 12/19/2019 1401   K 3.9 01/20/2017 1416   K 3.2 (L) 01/17/2016 1047   CL 101 12/19/2019 1401   CL 102 01/20/2017 1416   CO2 30 12/19/2019 1401   CO2 26 01/20/2017 1416   CO2 27 01/17/2016 1047   BUN 15 12/19/2019 1401   BUN 15 01/20/2017 1416   BUN 6.6 (L) 01/17/2016 1047   CREATININE 0.95 12/19/2019 1401   CREATININE 1.1 01/20/2017 1416   CREATININE 0.7 01/17/2016 1047      Component Value Date/Time   CALCIUM 9.8 12/19/2019 1401   CALCIUM 9.1 01/20/2017 1416   CALCIUM 8.7 01/17/2016 1047   ALKPHOS 64 12/19/2019 1401   ALKPHOS 84 01/20/2017 1416   ALKPHOS 90 01/17/2016 1047   AST 26 12/19/2019 1401   AST 34 01/17/2016 1047   ALT 27 12/19/2019 1401   ALT 35 01/20/2017 1416   ALT 38 01/17/2016 1047   BILITOT 0.6 12/19/2019 1401   BILITOT 0.42 01/17/2016 1047       Impression and Plan: Ms. Mcbane is a very pleasant 59 yo caucasian female withlong standing history ofmetastaticcarcinoidwith recurrent liver metastasis.  We will have to see what the chromogranin A level is.  Hopefully it is stable.  The MRI will clearly be important for [place].  She gets her Somatuline today.  She says this really helps her feel better.  We will have her come back to see [place] in another couple months.   [name], MD 11/8/20215:38 PM

## 2019-12-20 LAB — CHROMOGRANIN A: Chromogranin A (ng/mL): 152.5 ng/mL — ABNORMAL HIGH (ref 0.0–101.8)

## 2019-12-28 ENCOUNTER — Other Ambulatory Visit: Payer: Self-pay

## 2019-12-28 ENCOUNTER — Ambulatory Visit (INDEPENDENT_AMBULATORY_CARE_PROVIDER_SITE_OTHER): Payer: 59 | Admitting: Family Medicine

## 2019-12-28 ENCOUNTER — Encounter (INDEPENDENT_AMBULATORY_CARE_PROVIDER_SITE_OTHER): Payer: Self-pay | Admitting: Family Medicine

## 2019-12-28 VITALS — BP 130/83 | HR 97 | Temp 98.6°F | Ht 59.0 in | Wt 217.0 lb

## 2019-12-28 DIAGNOSIS — F3289 Other specified depressive episodes: Secondary | ICD-10-CM

## 2019-12-28 DIAGNOSIS — E1169 Type 2 diabetes mellitus with other specified complication: Secondary | ICD-10-CM | POA: Diagnosis not present

## 2019-12-28 DIAGNOSIS — I152 Hypertension secondary to endocrine disorders: Secondary | ICD-10-CM

## 2019-12-28 DIAGNOSIS — E1159 Type 2 diabetes mellitus with other circulatory complications: Secondary | ICD-10-CM

## 2019-12-28 DIAGNOSIS — Z9189 Other specified personal risk factors, not elsewhere classified: Secondary | ICD-10-CM

## 2019-12-28 DIAGNOSIS — Z6841 Body Mass Index (BMI) 40.0 and over, adult: Secondary | ICD-10-CM

## 2019-12-28 MED ORDER — OZEMPIC (0.25 OR 0.5 MG/DOSE) 2 MG/1.5ML ~~LOC~~ SOPN: 0.5000 mg | PEN_INJECTOR | SUBCUTANEOUS | 0 refills | Status: DC

## 2019-12-30 ENCOUNTER — Encounter: Payer: Self-pay | Admitting: Hematology & Oncology

## 2020-01-02 ENCOUNTER — Telehealth: Payer: Self-pay

## 2020-01-02 NOTE — Progress Notes (Signed)
Chief Complaint:   OBESITY Leah [name] is here to discuss her progress with her obesity treatment plan along with follow-up of her obesity related diagnoses.   Today's visit was #: 7 Starting weight: 226 lbs Starting date: 07/07/2019 Today's weight: 217 lbs Today's date: 12/28/2019 Total lbs lost to date: 9 lbs Body mass index is 43.83 kg/m.  Total weight loss percentage to date: -3.98%  Interim History: Leah [name] says she is doing well with semaglutide.  She is tolerating it well.  Helps with polyphagia.  Nutrition Plan: the Category 2 Plan for 80% of the time.  Anti-obesity medications: Ozempic, metformin. Reported side effects: None. Hunger is well controlled controlled. Cravings are well controlled controlled.  Activity: Walking for 10-15 minutes 3 times per week.  Assessment/Plan:   1. Hypertension associated with type 2 diabetes mellitus ([place]) At goal. Medications: Norvasc, metoprolol, Diovan.   Plan: Avoid buying foods that are: processed, frozen, or prepackaged to avoid excess salt. We will continue to monitor symptoms as they relate to her weight loss journey.  BP Readings from Last 3 Encounters:  12/28/19 130/83  12/19/19 (!) 155/102  12/12/19 (!) 147/85   Lab Results  Component Value Date   CREATININE 0.95 12/19/2019   2. Type 2 diabetes mellitus with other specified complication, without long-term current use of insulin (HCC) Diabetes Mellitus: Improving. Medication: Ozempic, metformin. Issues reviewed with her: blood sugar goals, complications of diabetes mellitus, hypoglycemia prevention and treatment, exercise, and nutrition.  Lab Results  Component Value Date   HGBA1C 6.8 (H) 11/02/2019   HGBA1C 7.3 (H) 07/06/2019   HGBA1C 7.1 (H) 12/24/2018   Lab Results  Component Value Date   MICROALBUR 0.9 07/06/2019   LDLCALC 63 11/02/2019   CREATININE 0.95 12/19/2019   - Refill Semaglutide,0.25 or 0.5MG /DOS, (OZEMPIC, 0.25 OR 0.5 MG/DOSE,) 2 MG/1.5ML SOPN;  Inject 0.5 mg into the skin once a week.  Dispense: 6 mL; Refill: 0  3. Other depression with emotional eating Improving.  Discussed cues and consequences, how thoughts affect eating, model of thoughts, feelings, and behaviors, and strategies for change by focusing on the cue. Discussed cognitive distortions, coping thoughts, and how to change your thoughts.  4. At risk for constipation Leah [name] was given approximately 15 minutes of counseling today regarding prevention of constipation. She was encouraged to increase water and fiber intake.   5. Class 3 severe obesity with serious comorbidity and body mass index (BMI) of 40.0 to 44.9 in adult, unspecified obesity type ([place])  Course: Leah [name] is currently in the action stage of change. As such, her goal is to continue with weight loss efforts.   Nutrition goals: She has agreed to the Category 2 Plan.   Exercise goals: As is.  Behavioral modification strategies: increasing lean protein intake, decreasing simple carbohydrates, increasing vegetables, increasing water intake, dealing with family or coworker sabotage and holiday eating strategies .  Leah [name] has agreed to follow-up with our clinic in 3-4 weeks. She was informed of the importance of frequent follow-up visits to maximize her success with intensive lifestyle modifications for her multiple health conditions.   Objective:   Blood pressure 130/83, pulse 97, temperature 98.6 F (37 C), height 4\' 11"  (1.499 m), weight 217 lb (98.4 kg), SpO2 96 %. Body mass index is 43.83 kg/m.  General: Cooperative, alert, well developed, in no acute distress. HEENT: Conjunctivae and lids unremarkable. Cardiovascular: Regular rhythm.  Lungs: Normal work of breathing. Neurologic: No focal deficits.   Lab Results  Component Value  Date   CREATININE 0.95 12/19/2019   BUN 15 12/19/2019   NA 140 12/19/2019   K 4.3 12/19/2019   CL 101 12/19/2019   CO2 30 12/19/2019   Lab Results  Component Value Date    ALT 27 12/19/2019   AST 26 12/19/2019   ALKPHOS 64 12/19/2019   BILITOT 0.6 12/19/2019   Lab Results  Component Value Date   HGBA1C 6.8 (H) 11/02/2019   HGBA1C 7.3 (H) 07/06/2019   HGBA1C 7.1 (H) 12/24/2018   HGBA1C 8.1 (A) 08/31/2018   HGBA1C 6.5 (H) 11/30/2017   Lab Results  Component Value Date   TSH 1.69 07/06/2019   Lab Results  Component Value Date   CHOL 140 11/02/2019   HDL 58 11/02/2019   LDLCALC 63 11/02/2019   TRIG 111 11/02/2019   CHOLHDL 2.4 11/02/2019   Lab Results  Component Value Date   WBC 14.8 (H) 12/19/2019   HGB 13.3 12/19/2019   HCT 40.9 12/19/2019   MCV 92.3 12/19/2019   PLT 271 12/19/2019   Lab Results  Component Value Date   IRON <10 (L) 10/18/2008   TIBC NOT CALC Not calculated due to Iron <10. 10/18/2008   FERRITIN 382 (H) 10/18/2008   Attestation Statements:   Reviewed by clinician on day of visit: allergies, medications, problem list, medical history, surgical history, family history, social history, and previous encounter notes.  I, Water quality scientist, CMA, am acting as transcriptionist for Briscoe Deutscher, DO  I have reviewed the above documentation for accuracy and completeness, and I agree with the above. Briscoe Deutscher, DO

## 2020-01-02 NOTE — Telephone Encounter (Signed)
Lab appt added prior to inj on 01/24/20 per 12/30/19 inbasket message, also per message pt is aware and does not need a call,,,, AOM

## 2020-01-11 ENCOUNTER — Other Ambulatory Visit: Payer: Self-pay | Admitting: Family Medicine

## 2020-01-11 ENCOUNTER — Ambulatory Visit (INDEPENDENT_AMBULATORY_CARE_PROVIDER_SITE_OTHER): Payer: 59 | Admitting: Family Medicine

## 2020-01-16 ENCOUNTER — Ambulatory Visit (INDEPENDENT_AMBULATORY_CARE_PROVIDER_SITE_OTHER): Payer: 59 | Admitting: Family Medicine

## 2020-01-16 ENCOUNTER — Encounter (INDEPENDENT_AMBULATORY_CARE_PROVIDER_SITE_OTHER): Payer: Self-pay | Admitting: Family Medicine

## 2020-01-16 ENCOUNTER — Other Ambulatory Visit: Payer: Self-pay

## 2020-01-16 VITALS — BP 125/67 | HR 130 | Temp 98.0°F | Ht 59.0 in | Wt 216.0 lb

## 2020-01-16 DIAGNOSIS — Z6841 Body Mass Index (BMI) 40.0 and over, adult: Secondary | ICD-10-CM

## 2020-01-16 DIAGNOSIS — E7849 Other hyperlipidemia: Secondary | ICD-10-CM | POA: Diagnosis not present

## 2020-01-18 ENCOUNTER — Encounter: Payer: Self-pay | Admitting: Family Medicine

## 2020-01-18 NOTE — Progress Notes (Signed)
Chief Complaint:   OBESITY Leah [name] is here to discuss her progress with her obesity treatment plan along with follow-up of her obesity related diagnoses. Leah [name] is on the Category 2 Plan and states she is following her eating plan approximately 90% of the time. Leah [name] states she is walking for 30 minutes 5 times per week.  Today's visit was #: 8 Starting weight: 226 lbs Starting date: 07/07/2019 Today's weight: 216 lbs Today's date: 01/16/2020 Total lbs lost to date: 10 Total lbs lost since last in-office visit: 1  Interim History: Leah [name] continues to do well with weight loss, but she is getting a bit bored with her Category 2 plan especially for dinner. She would like to discuss more options.  Subjective:   1. Other hyperlipidemia Leah [name] is on Crestor, and she is working on diet and exercise. She will be due for labs in approximately 1 month. No chest pain or myalgias noted.  Assessment/Plan:   1. Other hyperlipidemia Cardiovascular risk and specific lipid/LDL goals reviewed. We discussed several lifestyle modifications today. Leah [name] will continue to work on diet, exercise and weight loss efforts. Orders and follow up as documented in patient record.   2. Class 3 severe obesity with serious comorbidity and body mass index (BMI) of 40.0 to 44.9 in adult, unspecified obesity type (HCC) Leah [name] is currently in the action stage of change. As such, her goal is to continue with weight loss efforts. She has agreed to the Category 2 Plan and keeping a food journal and adhering to recommended goals of 400-550 calories and 35+ grams of protein at supper daily.   Exercise goals: As is.  Behavioral modification strategies: meal planning and cooking strategies and holiday eating strategies .  Leah [name] has agreed to follow-up with our clinic in 3 weeks with Dr. Juleen China. She was informed of the importance of frequent follow-up visits to maximize her success with intensive lifestyle modifications  for her multiple health conditions.   Objective:   Blood pressure 125/67, pulse (!) 130, temperature 98 F (36.7 C), height 4\' 11"  (1.499 m), weight 216 lb (98 kg), SpO2 97 %. Body mass index is 43.63 kg/m.  General: Cooperative, alert, well developed, in no acute distress. HEENT: Conjunctivae and lids unremarkable. Cardiovascular: Regular rhythm.  Lungs: Normal work of breathing. Neurologic: No focal deficits.   Lab Results  Component Value Date   CREATININE 0.95 12/19/2019   BUN 15 12/19/2019   NA 140 12/19/2019   K 4.3 12/19/2019   CL 101 12/19/2019   CO2 30 12/19/2019   Lab Results  Component Value Date   ALT 27 12/19/2019   AST 26 12/19/2019   ALKPHOS 64 12/19/2019   BILITOT 0.6 12/19/2019   Lab Results  Component Value Date   HGBA1C 6.8 (H) 11/02/2019   HGBA1C 7.3 (H) 07/06/2019   HGBA1C 7.1 (H) 12/24/2018   HGBA1C 8.1 (A) 08/31/2018   HGBA1C 6.5 (H) 11/30/2017   No results found for: INSULIN Lab Results  Component Value Date   TSH 1.69 07/06/2019   Lab Results  Component Value Date   CHOL 140 11/02/2019   HDL 58 11/02/2019   LDLCALC 63 11/02/2019   TRIG 111 11/02/2019   CHOLHDL 2.4 11/02/2019   Lab Results  Component Value Date   WBC 14.8 (H) 12/19/2019   HGB 13.3 12/19/2019   HCT 40.9 12/19/2019   MCV 92.3 12/19/2019   PLT 271 12/19/2019   Lab Results  Component Value Date   IRON <10 (  L) 10/18/2008   TIBC NOT CALC Not calculated due to Iron <10. 10/18/2008   FERRITIN 382 (H) 10/18/2008   Attestation Statements:   Reviewed by clinician on day of visit: allergies, medications, problem list, medical history, surgical history, family history, social history, and previous encounter notes.  Time spent on visit including pre-visit chart review and post-visit care and charting was 23 minutes.    I, Trixie Dredge, am acting as transcriptionist for Dennard Nip, MD.  I have reviewed the above documentation for accuracy and completeness, and I  agree with the above. -  Dennard Nip, MD

## 2020-01-19 ENCOUNTER — Other Ambulatory Visit: Payer: Self-pay

## 2020-01-19 NOTE — Telephone Encounter (Signed)
Pt is requesting Zofran 4mg  disintergating tablet LOV: 11/02/2019 Next Visit: 04/11/2020  Sent to [facility]   Please advise.

## 2020-01-20 MED ORDER — ONDANSETRON 4 MG PO TBDP: 4.0000 mg | ORAL_TABLET | Freq: Three times a day (TID) | ORAL | 1 refills | Status: DC | PRN

## 2020-01-20 NOTE — Addendum Note (Signed)
Addended by: Orma Flaming on: 01/20/2020 01:10 PM   Modules accepted: Orders

## 2020-01-20 NOTE — Telephone Encounter (Signed)
Patient calling in to make sure her medications are sent in the the fax number below before the weekend.

## 2020-01-23 ENCOUNTER — Other Ambulatory Visit: Payer: Self-pay

## 2020-01-23 DIAGNOSIS — C7B8 Other secondary neuroendocrine tumors: Secondary | ICD-10-CM

## 2020-01-24 ENCOUNTER — Inpatient Hospital Stay: Payer: 59

## 2020-01-24 ENCOUNTER — Inpatient Hospital Stay: Payer: 59 | Attending: Hematology & Oncology

## 2020-01-24 ENCOUNTER — Other Ambulatory Visit: Payer: Self-pay

## 2020-01-24 VITALS — BP 140/107 | HR 83 | Temp 99.2°F | Resp 18

## 2020-01-24 DIAGNOSIS — C7B8 Other secondary neuroendocrine tumors: Secondary | ICD-10-CM

## 2020-01-24 DIAGNOSIS — C7A00 Malignant carcinoid tumor of unspecified site: Secondary | ICD-10-CM | POA: Diagnosis present

## 2020-01-24 DIAGNOSIS — C7B02 Secondary carcinoid tumors of liver: Secondary | ICD-10-CM | POA: Insufficient documentation

## 2020-01-24 DIAGNOSIS — C7B00 Secondary carcinoid tumors, unspecified site: Secondary | ICD-10-CM | POA: Insufficient documentation

## 2020-01-24 LAB — CMP (CANCER CENTER ONLY)
ALT: 22 U/L (ref 0–44)
AST: 19 U/L (ref 15–41)
Albumin: 4.2 g/dL (ref 3.5–5.0)
Alkaline Phosphatase: 70 U/L (ref 38–126)
Anion gap: 8 (ref 5–15)
BUN: 19 mg/dL (ref 6–20)
CO2: 28 mmol/L (ref 22–32)
Calcium: 10.1 mg/dL (ref 8.9–10.3)
Chloride: 103 mmol/L (ref 98–111)
Creatinine: 0.87 mg/dL (ref 0.44–1.00)
GFR, Estimated: >60 mL/min (ref >60)
Glucose, Bld: 146 mg/dL — ABNORMAL HIGH (ref 70–99)
Potassium: 5.2 mmol/L — ABNORMAL HIGH (ref 3.5–5.1)
Sodium: 139 mmol/L (ref 135–145)
Total Bilirubin: 0.5 mg/dL (ref 0.3–1.2)
Total Protein: 7.1 g/dL (ref 6.5–8.1)

## 2020-01-24 LAB — CBC WITH DIFFERENTIAL (CANCER CENTER ONLY)
Abs Immature Granulocytes: 0.05 10*3/uL (ref 0.00–0.07)
Basophils Absolute: 0.1 10*3/uL (ref 0.0–0.1)
Basophils Relative: 1 %
Eosinophils Absolute: 0.1 10*3/uL (ref 0.0–0.5)
Eosinophils Relative: 1 %
HCT: 41.2 % (ref 36.0–46.0)
Hemoglobin: 13.2 g/dL (ref 12.0–15.0)
Immature Granulocytes: 0 %
Lymphocytes Relative: 25 %
Lymphs Abs: 3.2 10*3/uL (ref 0.7–4.0)
MCH: 29.6 pg (ref 26.0–34.0)
MCHC: 32 g/dL (ref 30.0–36.0)
MCV: 92.4 fL (ref 80.0–100.0)
Monocytes Absolute: 0.8 10*3/uL (ref 0.1–1.0)
Monocytes Relative: 7 %
Neutro Abs: 8.3 10*3/uL — ABNORMAL HIGH (ref 1.7–7.7)
Neutrophils Relative %: 66 %
Platelet Count: 313 10*3/uL (ref 150–400)
RBC: 4.46 MIL/uL (ref 3.87–5.11)
RDW: 14.6 % (ref 11.5–15.5)
WBC Count: 12.5 10*3/uL — ABNORMAL HIGH (ref 4.0–10.5)
nRBC: 0 % (ref 0.0–0.2)

## 2020-01-24 MED ORDER — LANREOTIDE ACETATE 120 MG/0.5ML ~~LOC~~ SOLN
120.0000 mg | Freq: Once | SUBCUTANEOUS | Status: AC
  Administered 2020-01-24: 120 mg via SUBCUTANEOUS

## 2020-01-24 MED ORDER — LANREOTIDE ACETATE 120 MG/0.5ML ~~LOC~~ SOLN
SUBCUTANEOUS | Status: AC
  Filled 2020-01-24: qty 120

## 2020-01-24 NOTE — Progress Notes (Signed)
Pt discharged in no apparent distress. Pt left ambulatory without assistance. Pt aware of discharge instructions and verbalized understanding and had no further questions.  

## 2020-01-24 NOTE — Patient Instructions (Signed)
Lanreotide injection What is this medicine? LANREOTIDE (lan REE oh tide) is used to reduce blood levels of growth hormone in patients with a condition called acromegaly. It also works to slow or stop tumor growth in patients with neuroendocrine tumors and treat carcinoid syndrome. This medicine may be used for other purposes; ask your health care provider or pharmacist if you have questions. COMMON BRAND NAME(S): Somatuline [place] What should I tell my health care provider before I take this medicine? They need to know if you have any of these conditions:  diabetes  gallbladder disease  heart disease  kidney disease  liver disease  thyroid disease  an unusual or allergic reaction to lanreotide, other medicines, foods, dyes, or preservatives  pregnant or trying to get pregnant  breast-feeding How should I use this medicine? This medicine is for injection under the skin. It is given by a health care professional in a hospital or clinic setting. Contact your pediatrician or health care professional regarding the use of this medicine in children. Special care may be needed. Overdosage: If you think you have taken too much of this medicine contact a poison control center or emergency room at once. NOTE: This medicine is only for you. Do not share this medicine with others. What if I miss a dose? It is important not to miss your dose. Call your doctor or health care professional if you are unable to keep an appointment. What may interact with this medicine? This medicine may interact with the following medications:  bromocriptine  cyclosporine  certain medicines for blood pressure, heart disease, irregular heart beat  certain medicines for diabetes  quinidine  terfenadine This list may not describe all possible interactions. Give your health care provider a list of all the medicines, herbs, non-prescription drugs, or dietary supplements you use. Also tell them if you smoke,  drink alcohol, or use illegal drugs. Some items may interact with your medicine. What should I watch for while using this medicine? Tell your doctor or healthcare professional if your symptoms do not start to get better or if they get worse. Visit your doctor or health care professional for regular checks on your progress. Your condition will be monitored carefully while you are receiving this medicine. This medicine may increase blood sugar. Ask your healthcare provider if changes in diet or medicines are needed if you have diabetes. You may need blood work done while you are taking this medicine. Women should inform their doctor if they wish to become pregnant or think they might be pregnant. There is a potential for serious side effects to an unborn child. Talk to your health care professional or pharmacist for more information. Do not breast-feed an infant while taking this medicine or for 6 months after stopping it. This medicine has caused ovarian failure in some women. This medicine may interfere with the ability to have a child. Talk with your doctor or health care professional if you are concerned about your fertility. What side effects may I notice from receiving this medicine? Side effects that you should report to your doctor or health care professional as soon as possible:  allergic reactions like skin rash, itching or hives, swelling of the face, lips, or tongue  increased blood pressure  severe stomach pain  signs and symptoms of hgh blood sugar such as being more thirsty or hungry or having to urinate more than normal. You may also feel very tired or have blurry vision.  signs and symptoms of low blood   sugar such as feeling anxious; confusion; dizziness; increased hunger; unusually weak or tired; sweating; shakiness; cold; irritable; headache; blurred vision; fast heartbeat; loss of consciousness  unusually slow heartbeat Side effects that usually do not require medical  attention (report to your doctor or health care professional if they continue or are bothersome):  constipation  diarrhea  dizziness  headache  muscle pain  muscle spasms  nausea  pain, redness, or irritation at site where injected This list may not describe all possible side effects. Call your doctor for medical advice about side effects. You may report side effects to FDA at 1-800-FDA-1088. Where should I keep my medicine? This drug is given in a hospital or clinic and will not be stored at home. NOTE: This sheet is a summary. It may not cover all possible information. If you have questions about this medicine, talk to your doctor, pharmacist, or health care provider.  2020 [employer]vier/Gold Standard (2017-11-05 09:13:08)  

## 2020-02-10 ENCOUNTER — Other Ambulatory Visit: Payer: Self-pay | Admitting: Family Medicine

## 2020-02-10 DIAGNOSIS — M4722 Other spondylosis with radiculopathy, cervical region: Secondary | ICD-10-CM

## 2020-02-11 ENCOUNTER — Encounter (INDEPENDENT_AMBULATORY_CARE_PROVIDER_SITE_OTHER): Payer: Self-pay | Admitting: Family Medicine

## 2020-02-13 ENCOUNTER — Encounter (INDEPENDENT_AMBULATORY_CARE_PROVIDER_SITE_OTHER): Payer: Self-pay | Admitting: Family Medicine

## 2020-02-13 ENCOUNTER — Other Ambulatory Visit: Payer: Self-pay

## 2020-02-13 ENCOUNTER — Telehealth (INDEPENDENT_AMBULATORY_CARE_PROVIDER_SITE_OTHER): Payer: 59 | Admitting: Family Medicine

## 2020-02-13 DIAGNOSIS — M25562 Pain in left knee: Secondary | ICD-10-CM

## 2020-02-13 DIAGNOSIS — M25561 Pain in right knee: Secondary | ICD-10-CM

## 2020-02-13 DIAGNOSIS — E1169 Type 2 diabetes mellitus with other specified complication: Secondary | ICD-10-CM

## 2020-02-13 DIAGNOSIS — G8929 Other chronic pain: Secondary | ICD-10-CM

## 2020-02-13 DIAGNOSIS — Z6841 Body Mass Index (BMI) 40.0 and over, adult: Secondary | ICD-10-CM

## 2020-02-14 NOTE — Progress Notes (Signed)
TeleHealth Visit:  Due to the COVID-19 pandemic, this visit was completed with telemedicine (audio/video) technology to reduce patient and provider exposure as well as to preserve personal protective equipment.   Leah [name] has verbally consented to this TeleHealth visit. The patient is located at home, the provider is located at the [facility] office. The participants in this visit include the listed provider and patient. The visit was conducted today via MyChart video.  Chief Complaint: OBESITY Leah [name] is here to discuss her progress with her obesity treatment plan along with follow-up of her obesity related diagnoses. Leah [name] is on the Category 2 Plan and states she is following her eating plan approximately 80% of the time. Leah [name] states she is doing more walking recently.  Today's visit was #: 9 Starting weight: 226 lbs Starting date: 07/07/2019  Interim History: Leah [name] had bilateral knee injections on 02/08/2020.  She says that her whole family is in on weight loss now.  Her goal is to lose 4-5 pounds before her next visit.  Assessment/Plan:   1. Type 2 diabetes mellitus with other specified complication, without long-term current use of insulin (Leah [name]) Diabetes Mellitus: reasonably well controlled. Medication: metformin 500 mg twice daily and Ozempic 0.5 mg subcutaneously weekly.  She is tolerating the medication well, but still needs Zofran at times.  Issues reviewed with her: blood sugar goals, complications of diabetes mellitus, hypoglycemia prevention and treatment, exercise, and nutrition.  Lab Results  Component Value Date   HGBA1C 6.8 (H) 11/02/2019   HGBA1C 7.3 (H) 07/06/2019   HGBA1C 7.1 (H) 12/24/2018   Lab Results  Component Value Date   MICROALBUR 0.9 07/06/2019   LDLCALC 63 11/02/2019   CREATININE 0.87 01/24/2020   2. Chronic pain of both knees Slightly improved after knee injections. We will continue to monitor symptoms as they relate to her weight  loss journey.  3. Class 3 severe obesity with serious comorbidity and body mass index (BMI) of 40.0 to 44.9 in adult, unspecified obesity type (HCC)  Leah [name] is currently in the action stage of change. As such, her goal is to continue with weight loss efforts. She has agreed to the Category 2 Plan.   Exercise goals: As tolerated.  Behavioral modification strategies: increasing lean protein intake, decreasing simple carbohydrates, increasing vegetables, increasing water intake and decreasing liquid calories.  Leah [name] has agreed to follow-up with our clinic in 2-3 weeks. She was informed of the importance of frequent follow-up visits to maximize her success with intensive lifestyle modifications for her multiple health conditions.  Objective:   VITALS: Per patient if applicable, see vitals. GENERAL: Alert and in no acute distress. CARDIOPULMONARY: No increased WOB. Speaking in clear sentences.  PSYCH: Pleasant and cooperative. Speech normal rate and rhythm. Affect is appropriate. Insight and judgement are appropriate. Attention is focused, linear, and appropriate.  NEURO: Oriented as arrived to appointment on time with no prompting.   Lab Results  Component Value Date   CREATININE 0.87 01/24/2020   BUN 19 01/24/2020   NA 139 01/24/2020   K 5.2 (H) 01/24/2020   CL 103 01/24/2020   CO2 28 01/24/2020   Lab Results  Component Value Date   ALT 22 01/24/2020   AST 19 01/24/2020   ALKPHOS 70 01/24/2020   BILITOT 0.5 01/24/2020   Lab Results  Component Value Date   HGBA1C 6.8 (H) 11/02/2019   HGBA1C 7.3 (H) 07/06/2019   HGBA1C 7.1 (H) 12/24/2018   HGBA1C 8.1 (A) 08/31/2018  HGBA1C 6.5 (H) 11/30/2017   Lab Results  Component Value Date   TSH 1.69 07/06/2019   Lab Results  Component Value Date   CHOL 140 11/02/2019   HDL 58 11/02/2019   LDLCALC 63 11/02/2019   TRIG 111 11/02/2019   CHOLHDL 2.4 11/02/2019   Lab Results  Component Value Date   WBC 12.5 (H) 01/24/2020   HGB  13.2 01/24/2020   HCT 41.2 01/24/2020   MCV 92.4 01/24/2020   PLT 313 01/24/2020   Lab Results  Component Value Date   IRON <10 (L) 10/18/2008   TIBC NOT CALC Not calculated due to Iron <10. 10/18/2008   FERRITIN 382 (H) 10/18/2008   Attestation Statements:   Reviewed by clinician on day of visit: allergies, medications, problem list, medical history, surgical history, family history, social history, and previous encounter notes.  Time spent on visit including pre-visit chart review and post-visit charting and care was 30 minutes.   I, Insurance claims handler, CMA, am acting as transcriptionist for Helane Rima, DO  I have reviewed the above documentation for accuracy and completeness, and I agree with the above. Helane Rima, DO

## 2020-02-17 ENCOUNTER — Other Ambulatory Visit: Payer: Self-pay | Admitting: Family Medicine

## 2020-02-17 DIAGNOSIS — M4722 Other spondylosis with radiculopathy, cervical region: Secondary | ICD-10-CM

## 2020-02-17 NOTE — Telephone Encounter (Signed)
LAST APPOINTMENT DATE: 02/10/2020   NEXT APPOINTMENT DATE: 05/03/2020

## 2020-02-20 ENCOUNTER — Encounter: Payer: Self-pay | Admitting: Hematology & Oncology

## 2020-02-20 ENCOUNTER — Inpatient Hospital Stay: Payer: 59

## 2020-02-20 ENCOUNTER — Inpatient Hospital Stay (HOSPITAL_BASED_OUTPATIENT_CLINIC_OR_DEPARTMENT_OTHER): Payer: 59 | Admitting: Hematology & Oncology

## 2020-02-20 ENCOUNTER — Other Ambulatory Visit: Payer: Self-pay

## 2020-02-20 ENCOUNTER — Inpatient Hospital Stay: Payer: 59 | Attending: Hematology & Oncology

## 2020-02-20 VITALS — BP 140/81 | HR 102 | Temp 98.6°F | Resp 18 | Wt 214.0 lb

## 2020-02-20 DIAGNOSIS — C7A00 Malignant carcinoid tumor of unspecified site: Secondary | ICD-10-CM | POA: Insufficient documentation

## 2020-02-20 DIAGNOSIS — C7B02 Secondary carcinoid tumors of liver: Secondary | ICD-10-CM | POA: Diagnosis not present

## 2020-02-20 DIAGNOSIS — C7B8 Other secondary neuroendocrine tumors: Secondary | ICD-10-CM

## 2020-02-20 LAB — CMP (CANCER CENTER ONLY)
ALT: 30 U/L (ref 0–44)
AST: 19 U/L (ref 15–41)
Albumin: 4.2 g/dL (ref 3.5–5.0)
Alkaline Phosphatase: 71 U/L (ref 38–126)
Anion gap: 10 (ref 5–15)
BUN: 22 mg/dL — ABNORMAL HIGH (ref 6–20)
CO2: 25 mmol/L (ref 22–32)
Calcium: 9.9 mg/dL (ref 8.9–10.3)
Chloride: 103 mmol/L (ref 98–111)
Creatinine: 0.91 mg/dL (ref 0.44–1.00)
GFR, Estimated: >60 mL/min (ref >60)
Glucose, Bld: 114 mg/dL — ABNORMAL HIGH (ref 70–99)
Potassium: 4.2 mmol/L (ref 3.5–5.1)
Sodium: 138 mmol/L (ref 135–145)
Total Bilirubin: 0.4 mg/dL (ref 0.3–1.2)
Total Protein: 7.1 g/dL (ref 6.5–8.1)

## 2020-02-20 LAB — CBC WITH DIFFERENTIAL (CANCER CENTER ONLY)
Abs Immature Granulocytes: 0.16 10*3/uL — ABNORMAL HIGH (ref 0.00–0.07)
Basophils Absolute: 0.1 10*3/uL (ref 0.0–0.1)
Basophils Relative: 0 %
Eosinophils Absolute: 0.1 10*3/uL (ref 0.0–0.5)
Eosinophils Relative: 1 %
HCT: 42 % (ref 36.0–46.0)
Hemoglobin: 13.8 g/dL (ref 12.0–15.0)
Immature Granulocytes: 1 %
Lymphocytes Relative: 22 %
Lymphs Abs: 4.4 10*3/uL — ABNORMAL HIGH (ref 0.7–4.0)
MCH: 30.6 pg (ref 26.0–34.0)
MCHC: 32.9 g/dL (ref 30.0–36.0)
MCV: 93.1 fL (ref 80.0–100.0)
Monocytes Absolute: 1.4 10*3/uL — ABNORMAL HIGH (ref 0.1–1.0)
Monocytes Relative: 7 %
Neutro Abs: 13.9 10*3/uL — ABNORMAL HIGH (ref 1.7–7.7)
Neutrophils Relative %: 69 %
Platelet Count: 339 10*3/uL (ref 150–400)
RBC: 4.51 MIL/uL (ref 3.87–5.11)
RDW: 14 % (ref 11.5–15.5)
WBC Count: 20 10*3/uL — ABNORMAL HIGH (ref 4.0–10.5)
nRBC: 0 % (ref 0.0–0.2)

## 2020-02-20 LAB — LACTATE DEHYDROGENASE: LDH: 196 U/L — ABNORMAL HIGH (ref 98–192)

## 2020-02-20 MED ORDER — LANREOTIDE ACETATE 120 MG/0.5ML ~~LOC~~ SOLN
SUBCUTANEOUS | Status: AC
  Filled 2020-02-20: qty 120

## 2020-02-20 MED ORDER — LANREOTIDE ACETATE 120 MG/0.5ML ~~LOC~~ SOLN
120.0000 mg | Freq: Once | SUBCUTANEOUS | Status: AC
  Administered 2020-02-20: 120 mg via SUBCUTANEOUS

## 2020-02-20 NOTE — Progress Notes (Signed)
Hematology and Oncology Follow Up Visit  Leah [name] [id number] 12-30-1960 60 y.o. 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 Leah [name] is here today for follow-up and Somatuline injection.  She looks great.  She is losing some weight.  I am so happy for her.  She is part of a nutrition program with [facility].  She did have an MRI of the abdomen at [facility] back in November.  Thankfully, the MRI showed that there is no new tumors in the liver.  She actually did not seem to have any obvious tumor in the liver.  She has had no problems with diarrhea.  There has been no issues with nausea or vomiting.  She has had no issues with leg swelling.  Of note, her last chromogranin A level was 152.  She does tend to fluctuate.  She has had no fever.  There is been no cough or shortness of breath.  She has had no headache.  Overall, her performance status is ECOG 1.   Medications:  Allergies as of 02/20/2020   No Known Allergies     Medication List       Accurate as of February 20, 2020  2:25 PM. If you have any questions, ask your nurse or doctor.        amLODipine 10 MG tablet Commonly known as: NORVASC TAKE 1 TABLET BY MOUTH EVERY DAY   aspirin 81 MG EC tablet TAKE 1 TABLET BY MOUTH EVERY DAY *SWALLOW WHOLE*   clonazePAM 0.5 MG tablet Commonly known as: KLONOPIN TAKE 1 TABLET BY MOUTH TWICE A DAY AS NEEDED FOR ANXIETY   fexofenadine 180 MG tablet Commonly known as: ALLEGRA TAKE 1 TABLET BY MOUTH EVERY DAY   gabapentin 100 MG capsule Commonly known as: NEURONTIN TAKE 1 CAPSULE (100 MG TOTAL) BY MOUTH AT BEDTIME.   ketoconazole 2 % cream Commonly known as: NIZORAL ketoconazole 2 % topical cream   metFORMIN 500 MG tablet Commonly known as: GLUCOPHAGE Take 1 tablet (500 mg total) by  mouth 2 (two) times daily with a meal.   metoprolol succinate 100 MG 24 hr tablet Commonly known as: TOPROL-XL TAKE 1 TABLET BY MOUTH EVERY EVENING WITH OR IMMEDIATELY FOLLOWING A MEAL   montelukast 10 MG tablet Commonly known as: SINGULAIR TAKE 1 TABLET BY MOUTH EVERYDAY AT BEDTIME   moxifloxacin 0.5 % ophthalmic solution Commonly known as: VIGAMOX   naproxen 500 MG tablet Commonly known as: NAPROSYN TAKE 1 TABLET TWICE A DAY WITH A MEAL   octreotide 30 MG injection Commonly known as: SANDOSTATIN LAR Inject 30 mg into the muscle every 28 (twenty-eight) days.   ondansetron 4 MG disintegrating tablet Commonly known as: Zofran ODT Take 1 tablet (4 mg total) by mouth every 8 (eight) hours as needed for nausea or vomiting.   ondansetron 4 MG tablet Commonly known as: ZOFRAN TAKE 1 TABLET BY MOUTH EVERY 8 HOURS AS NEEDED FOR NAUSEA OR VOMITING   Osphena 60 MG Tabs Generic drug: Ospemifene Take 1 tablet by mouth daily.   oxyCODONE 5 MG immediate release tablet Commonly known as: Oxy IR/ROXICODONE oxycodone 5 mg tablet   Ozempic (0.25 or 0.5 MG/DOSE) 2 MG/1.5ML Sopn Generic drug: Semaglutide(0.25 or 0.5MG /DOS) Inject 0.5 mg into the skin once a week.   pantoprazole 40 MG tablet Commonly known as: PROTONIX TAKE  1 TABLET BY MOUTH TWICE A DAY   ProAir HFA 108 (90 Base) MCG/ACT inhaler Generic drug: albuterol INHALE 2 PUFFS INTO THE LUNGS EVERY 6 (SIX) HOURS AS NEEDED. FOR SHORTNESS OF BREATH. What changed: See the new instructions.   rosuvastatin 10 MG tablet Commonly known as: Crestor Take 1 tablet (10 mg total) by mouth daily.   valACYclovir 1000 MG tablet Commonly known as: VALTREX Take by mouth.   valsartan 320 MG tablet Commonly known as: DIOVAN TAKE 1 TABLET BY MOUTH EVERY DAY   venlafaxine XR 150 MG 24 hr capsule Commonly known as: EFFEXOR-XR TAKE 1 CAPSULE (150 MG TOTAL) BY MOUTH EACH MORNING   Xerese 5-1 % Crea Generic drug:  Acyclovir-Hydrocortisone Xerese 5 %-1 % topical cream       Allergies: No Known Allergies  Past Medical History, Surgical history, Social history, and Family History were reviewed and updated.  Review of Systems: Review of Systems  Constitutional: Negative.   HENT: Negative.   Eyes: Negative.   Respiratory: Negative.   Cardiovascular: Negative.   Gastrointestinal: Negative.   Genitourinary: Negative.   Musculoskeletal: Negative.   Skin: Negative.   Neurological: Negative.   Endo/Heme/Allergies: Negative.   Psychiatric/Behavioral: Negative.      Physical Exam:  weight is 214 lb (97.1 kg). Her oral temperature is 98.6 F (37 C). Her blood pressure is 140/81 and her pulse is 102 (abnormal). Her respiration is 18 and oxygen saturation is 100%.   Wt Readings from Last 3 Encounters:  02/20/20 214 lb (97.1 kg)  01/16/20 216 lb (98 kg)  12/28/19 217 lb (98.4 kg)    Physical Exam Vitals reviewed.  HENT:     Head: Normocephalic and atraumatic.  Eyes:     Pupils: Pupils are equal, round, and reactive to light.  Cardiovascular:     Rate and Rhythm: Normal rate and regular rhythm.     Heart sounds: Normal heart sounds.  Pulmonary:     Effort: Pulmonary effort is normal.     Breath sounds: Normal breath sounds.  Abdominal:     General: Bowel sounds are normal.     Palpations: Abdomen is soft.     Comments: Abdominal exam shows an obese abdomen that is soft.  She has good bowel sounds.  There is no fluid wave.  She has laparotomy scars that are well-healed.  There is no tenderness.  There is no guarding or rebound.  There is no palpable liver or spleen tip.  Musculoskeletal:        General: No tenderness or deformity. Normal range of motion.     Cervical back: Normal range of motion.  Lymphadenopathy:     Cervical: No cervical adenopathy.  Skin:    General: Skin is warm and dry.     Findings: No erythema or rash.  Neurological:     Mental Status: She is alert and  oriented to person, place, and time.  Psychiatric:        Behavior: Behavior normal.        Thought Content: Thought content normal.        Judgment: Judgment normal.      Lab Results  Component Value Date   WBC 20.0 (H) 02/20/2020   HGB 13.8 02/20/2020   HCT 42.0 02/20/2020   MCV 93.1 02/20/2020   PLT 339 02/20/2020   Lab Results  Component Value Date   FERRITIN 382 (H) 10/18/2008   IRON <10 (L) 10/18/2008   TIBC NOT CALC Not calculated  due to Iron <10. 10/18/2008   UIBC 156 10/18/2008   IRONPCTSAT NOT CALC Not calculated due to Iron <10. 10/18/2008   Lab Results  Component Value Date   RETICCTPCT 0.9 10/18/2008   RBC 4.51 02/20/2020   No results found for: KPAFRELGTCHN, LAMBDASER, KAPLAMBRATIO No results found for: IGGSERUM, IGA, IGMSERUM No results found for: Odetta Pink, SPEI   Chemistry      Component Value Date/Time   NA 139 01/24/2020 1212   NA 140 01/20/2017 1416   NA 141 01/17/2016 1047   K 5.2 (H) 01/24/2020 1212   K 3.9 01/20/2017 1416   K 3.2 (L) 01/17/2016 1047   CL 103 01/24/2020 1212   CL 102 01/20/2017 1416   CO2 28 01/24/2020 1212   CO2 26 01/20/2017 1416   CO2 27 01/17/2016 1047   BUN 19 01/24/2020 1212   BUN 15 01/20/2017 1416   BUN 6.6 (L) 01/17/2016 1047   CREATININE 0.87 01/24/2020 1212   CREATININE 1.1 01/20/2017 1416   CREATININE 0.7 01/17/2016 1047      Component Value Date/Time   CALCIUM 10.1 01/24/2020 1212   CALCIUM 9.1 01/20/2017 1416   CALCIUM 8.7 01/17/2016 1047   ALKPHOS 70 01/24/2020 1212   ALKPHOS 84 01/20/2017 1416   ALKPHOS 90 01/17/2016 1047   AST 19 01/24/2020 1212   AST 34 01/17/2016 1047   ALT 22 01/24/2020 1212   ALT 35 01/20/2017 1416   ALT 38 01/17/2016 1047   BILITOT 0.5 01/24/2020 1212   BILITOT 0.42 01/17/2016 1047       Impression and Plan: Ms. Sanchez is a very pleasant 60 yo caucasian female withlong standing history  ofmetastaticcarcinoidwith recurrent liver metastasis.  We will have to see what the chromogranin A level is.  Again, she tends to fluctuate.  I am happy that the MRI looked so good.  It does not sound like she needs another MRI for quite a while.  I think she sees the surgeons at [facility] probably yearly.  She comes back to see [place] monthly for the symmetrical on injection.  I will likely see her back in about 3 months.    [name], MD 1/10/20222:25 PM

## 2020-02-20 NOTE — Patient Instructions (Signed)
Lanreotide injection What is this medicine? LANREOTIDE (lan REE oh tide) is used to reduce blood levels of growth hormone in patients with a condition called acromegaly. It also works to slow or stop tumor growth in patients with neuroendocrine tumors and treat carcinoid syndrome. This medicine may be used for other purposes; ask your health care provider or pharmacist if you have questions. COMMON BRAND NAME(S): Somatuline [place] What should I tell my health care provider before I take this medicine? They need to know if you have any of these conditions:  diabetes  gallbladder disease  heart disease  kidney disease  liver disease  thyroid disease  an unusual or allergic reaction to lanreotide, other medicines, foods, dyes, or preservatives  pregnant or trying to get pregnant  breast-feeding How should I use this medicine? This medicine is for injection under the skin. It is given by a health care professional in a hospital or clinic setting. Contact your pediatrician or health care professional regarding the use of this medicine in children. Special care may be needed. Overdosage: If you think you have taken too much of this medicine contact a poison control center or emergency room at once. NOTE: This medicine is only for you. Do not share this medicine with others. What if I miss a dose? It is important not to miss your dose. Call your doctor or health care professional if you are unable to keep an appointment. What may interact with this medicine? This medicine may interact with the following medications:  bromocriptine  cyclosporine  certain medicines for blood pressure, heart disease, irregular heart beat  certain medicines for diabetes  quinidine  terfenadine This list may not describe all possible interactions. Give your health care provider a list of all the medicines, herbs, non-prescription drugs, or dietary supplements you use. Also tell them if you smoke,  drink alcohol, or use illegal drugs. Some items may interact with your medicine. What should I watch for while using this medicine? Tell your doctor or healthcare professional if your symptoms do not start to get better or if they get worse. Visit your doctor or health care professional for regular checks on your progress. Your condition will be monitored carefully while you are receiving this medicine. This medicine may increase blood sugar. Ask your healthcare provider if changes in diet or medicines are needed if you have diabetes. You may need blood work done while you are taking this medicine. Women should inform their doctor if they wish to become pregnant or think they might be pregnant. There is a potential for serious side effects to an unborn child. Talk to your health care professional or pharmacist for more information. Do not breast-feed an infant while taking this medicine or for 6 months after stopping it. This medicine has caused ovarian failure in some women. This medicine may interfere with the ability to have a child. Talk with your doctor or health care professional if you are concerned about your fertility. What side effects may I notice from receiving this medicine? Side effects that you should report to your doctor or health care professional as soon as possible:  allergic reactions like skin rash, itching or hives, swelling of the face, lips, or tongue  increased blood pressure  severe stomach pain  signs and symptoms of hgh blood sugar such as being more thirsty or hungry or having to urinate more than normal. You may also feel very tired or have blurry vision.  signs and symptoms of low blood   sugar such as feeling anxious; confusion; dizziness; increased hunger; unusually weak or tired; sweating; shakiness; cold; irritable; headache; blurred vision; fast heartbeat; loss of consciousness  unusually slow heartbeat Side effects that usually do not require medical  attention (report to your doctor or health care professional if they continue or are bothersome):  constipation  diarrhea  dizziness  headache  muscle pain  muscle spasms  nausea  pain, redness, or irritation at site where injected This list may not describe all possible side effects. Call your doctor for medical advice about side effects. You may report side effects to FDA at 1-800-FDA-1088. Where should I keep my medicine? This drug is given in a hospital or clinic and will not be stored at home. NOTE: This sheet is a summary. It may not cover all possible information. If you have questions about this medicine, talk to your doctor, pharmacist, or health care provider.  2021 Elsevier/Gold Standard (2017-11-05 09:13:08)  

## 2020-02-21 ENCOUNTER — Telehealth: Payer: Self-pay | Admitting: Hematology & Oncology

## 2020-02-21 LAB — CHROMOGRANIN A: Chromogranin A (ng/mL): 93.9 ng/mL (ref 0.0–101.8)

## 2020-02-21 NOTE — Telephone Encounter (Signed)
Appointments scheduled patient has My Chart as well.  Patient aware of appointments per 1/10 los

## 2020-03-05 ENCOUNTER — Other Ambulatory Visit: Payer: Self-pay

## 2020-03-05 ENCOUNTER — Ambulatory Visit (INDEPENDENT_AMBULATORY_CARE_PROVIDER_SITE_OTHER): Payer: 59 | Admitting: Family Medicine

## 2020-03-05 ENCOUNTER — Encounter (INDEPENDENT_AMBULATORY_CARE_PROVIDER_SITE_OTHER): Payer: Self-pay | Admitting: Family Medicine

## 2020-03-05 VITALS — BP 133/82 | HR 83 | Temp 98.0°F | Ht 59.0 in | Wt 213.0 lb

## 2020-03-05 DIAGNOSIS — Z9189 Other specified personal risk factors, not elsewhere classified: Secondary | ICD-10-CM | POA: Diagnosis not present

## 2020-03-05 DIAGNOSIS — C7B8 Other secondary neuroendocrine tumors: Secondary | ICD-10-CM | POA: Diagnosis not present

## 2020-03-05 DIAGNOSIS — G8929 Other chronic pain: Secondary | ICD-10-CM

## 2020-03-05 DIAGNOSIS — M25562 Pain in left knee: Secondary | ICD-10-CM

## 2020-03-05 DIAGNOSIS — M25561 Pain in right knee: Secondary | ICD-10-CM | POA: Diagnosis not present

## 2020-03-05 DIAGNOSIS — E1169 Type 2 diabetes mellitus with other specified complication: Secondary | ICD-10-CM | POA: Diagnosis not present

## 2020-03-05 DIAGNOSIS — Z79899 Other long term (current) drug therapy: Secondary | ICD-10-CM

## 2020-03-05 DIAGNOSIS — Z6841 Body Mass Index (BMI) 40.0 and over, adult: Secondary | ICD-10-CM

## 2020-03-05 DIAGNOSIS — R946 Abnormal results of thyroid function studies: Secondary | ICD-10-CM

## 2020-03-06 LAB — HEMOGLOBIN A1C
Est. average glucose Bld gHb Est-mCnc: 137 mg/dL
Hgb A1c MFr Bld: 6.4 % — ABNORMAL HIGH (ref 4.8–5.6)

## 2020-03-06 LAB — VITAMIN B12: Vitamin B-12: 306 pg/mL (ref 232–1245)

## 2020-03-06 LAB — T4, FREE: Free T4: 1.1 ng/dL (ref 0.82–1.77)

## 2020-03-06 LAB — TSH: TSH: 2.72 u[IU]/mL (ref 0.450–4.500)

## 2020-03-06 NOTE — Progress Notes (Signed)
Chief Complaint:   OBESITY Leah [name] is here to discuss her progress with her obesity treatment plan along with follow-up of her obesity related diagnoses.   Today's visit was #: 10 Starting weight: 226 lbs Starting date: 07/07/2019 Today's weight: 213 lbs Today's date: 03/05/2020 Total lbs lost to date: 13 lbs Body mass index is 43.02 kg/m.  Total weight loss percentage to date: -5.75%  Interim History: Leah [name] says she will be meeting with Plastic Surgery for "side boob" reduction.  She says that Oncology is happy with her progress.  She is working on making good food choices at home.  Her daughter-in-law is also in the program. Plan: Send resources. Nutrition Plan: the Category 2 Plan for 50% of the time.  Activity: None at this time.  Assessment/Plan:   1. 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 [facility]) Leah [name] is followed by Oncology. Recent note reviewed.  2. Type 2 diabetes mellitus with other specified complication, without long-term current use of insulin (HCC) Diabetes Mellitus: Improving. Medication: metformin 500 mg twice daily and Ozempic 0.5 mg subcutaneously weekly. Issues reviewed with her: blood sugar goals, complications of diabetes mellitus, hypoglycemia prevention and treatment, exercise, and nutrition.  Lab Results  Component Value Date   HGBA1C 6.4 (H) 03/05/2020   HGBA1C 6.8 (H) 11/02/2019   HGBA1C 7.3 (H) 07/06/2019   Lab Results  Component Value Date   MICROALBUR 0.9 07/06/2019   LDLCALC 63 11/02/2019   CREATININE 0.91 02/20/2020   - Hemoglobin A1c  3. Chronic pain of both knees Slightly improved after knee injections. We will continue to monitor symptoms as they relate to her weight loss journey.  4. Abnormal thyroid function test Will check TSH and free T4 today.  - TSH - T4, free  5. Medication management Will check vitamin B12 level today as she is taking metformin.  - Vitamin B12  6. At risk for heart disease Due to  Leah [name]'s current state of health and medical condition(s), she is at a higher risk for heart disease.   This puts the patient at much greater risk to subsequently develop cardiopulmonary conditions that can significantly affect patient's quality of life in a negative manner as well.    At least 8 minutes was spent on counseling Leah [name] about these concerns today. Initial goal is to lose at least 5-10% of starting weight to help reduce these risk factors.  We will continue to reassess these conditions on a fairly regular basis in an attempt to decrease patient's overall morbidity and mortality.  Evidence-based interventions for health behavior change were utilized today including the discussion of self monitoring techniques, problem-solving barriers and SMART goal setting techniques.  Specifically regarding patient's less desirable eating habits and patterns, we employed the technique of small changes when Leah [name] has not been able to fully commit to her prudent nutritional plan.  7. Class 3 severe obesity with serious comorbidity and body mass index (BMI) of 40.0 to 44.9 in adult, unspecified obesity type ([place])  Course: Leah [name] is currently in the action stage of change. As such, her goal is to continue with weight loss efforts.   Nutrition goals: She has agreed to the Category 2 Plan.   Exercise goals: All adults should avoid inactivity. Some physical activity is better than none, and adults who participate in any amount of physical activity gain some health benefits.  Behavioral modification strategies: increasing lean protein intake, decreasing simple carbohydrates, increasing vegetables and increasing Leah intake.  Leah [name] has agreed to follow-up  with our clinic in 3 weeks. She was informed of the importance of frequent follow-up visits to maximize her success with intensive lifestyle modifications for her multiple health conditions.   Objective:   Blood pressure 133/82, pulse 83, temperature 98  F (36.7 C), temperature source Oral, height 4\' 11"  (1.499 m), weight 213 lb (96.6 kg), SpO2 97 %. Body mass index is 43.02 kg/m.  General: Cooperative, alert, well developed, in no acute distress. HEENT: Conjunctivae and lids unremarkable. Cardiovascular: Regular rhythm.  Lungs: Normal work of breathing. Neurologic: No focal deficits.   Lab Results  Component Value Date   CREATININE 0.91 02/20/2020   BUN 22 (H) 02/20/2020   NA 138 02/20/2020   K 4.2 02/20/2020   CL 103 02/20/2020   CO2 25 02/20/2020   Lab Results  Component Value Date   ALT 30 02/20/2020   AST 19 02/20/2020   ALKPHOS 71 02/20/2020   BILITOT 0.4 02/20/2020   Lab Results  Component Value Date   HGBA1C 6.4 (H) 03/05/2020   HGBA1C 6.8 (H) 11/02/2019   HGBA1C 7.3 (H) 07/06/2019   HGBA1C 7.1 (H) 12/24/2018   HGBA1C 8.1 (A) 08/31/2018   Lab Results  Component Value Date   TSH 2.720 03/05/2020   Lab Results  Component Value Date   CHOL 140 11/02/2019   HDL 58 11/02/2019   LDLCALC 63 11/02/2019   TRIG 111 11/02/2019   CHOLHDL 2.4 11/02/2019   Lab Results  Component Value Date   WBC 20.0 (H) 02/20/2020   HGB 13.8 02/20/2020   HCT 42.0 02/20/2020   MCV 93.1 02/20/2020   PLT 339 02/20/2020   Lab Results  Component Value Date   IRON <10 (L) 10/18/2008   TIBC NOT CALC Not calculated due to Iron <10. 10/18/2008   FERRITIN 382 (H) 10/18/2008   Attestation Statements:   Reviewed by clinician on day of visit: allergies, medications, problem list, medical history, surgical history, family history, social history, and previous encounter notes.  I, Leah [name], CMA, am acting as transcriptionist for Briscoe Deutscher, DO  I have reviewed the above documentation for accuracy and completeness, and I agree with the above. Briscoe Deutscher, DO

## 2020-03-08 ENCOUNTER — Encounter: Payer: Self-pay | Admitting: Family Medicine

## 2020-03-08 NOTE — Telephone Encounter (Signed)
FYI, Pt has scheduled an appointment for 03/09/2020.

## 2020-03-09 ENCOUNTER — Ambulatory Visit (INDEPENDENT_AMBULATORY_CARE_PROVIDER_SITE_OTHER): Payer: 59 | Admitting: Family Medicine

## 2020-03-09 ENCOUNTER — Encounter: Payer: Self-pay | Admitting: Family Medicine

## 2020-03-09 ENCOUNTER — Other Ambulatory Visit: Payer: Self-pay

## 2020-03-09 VITALS — BP 140/92 | HR 94 | Temp 96.6°F | Ht 59.0 in | Wt 213.8 lb

## 2020-03-09 DIAGNOSIS — B353 Tinea pedis: Secondary | ICD-10-CM | POA: Diagnosis not present

## 2020-03-09 MED ORDER — FLUCONAZOLE 150 MG PO TABS: 150.0000 mg | ORAL_TABLET | ORAL | 0 refills | Status: DC

## 2020-03-09 NOTE — Patient Instructions (Signed)
1) get over the counter clotrimazole cream and you will use this twice a day for a week after this rash resolves.  2) will also send in an oral pill to take weekly for a couple of weeks.    Athlete's Foot  Athlete's foot (tinea pedis) is a fungal infection of the skin on your feet. It often occurs on the skin that is between or underneath your toes. It can also occur on the soles of your feet. Symptoms include itchy or white and flaky areas on the skin. The infection can spread from person to person (is contagious). It can also spread when a person's bare feet come in contact with the fungus on shower floors or on items such as shoes. Follow these instructions at home: Medicines  Apply or take over-the-counter and prescription medicines only as told by your doctor.  Apply your antifungal medicine as told by your doctor. Do not stop using the medicine even if your feet start to get better. Foot care  Do not scratch your feet.  Keep your feet dry: ? Wear cotton or wool socks. Change your socks every day or if they become wet. ? Wear shoes that allow air to move around, such as sandals or canvas tennis shoes.  Wash and dry your feet: ? Every day or as told by your doctor. ? After exercising. ? Including the area between your toes. General instructions  Do not share any of these items that touch your feet: ? Towels. ? Shoes. ? Nail clippers. ? Other personal items.  Protect your feet by wearing sandals in wet areas, such as locker rooms and shared showers.  Keep all follow-up visits as told by your doctor. This is important.  If you have diabetes, keep your blood sugar under control. Contact a doctor if:  You have a fever.  You have swelling, pain, warmth, or redness in your foot.  Your feet are not getting better with treatment.  Your symptoms get worse.  You have new symptoms. Summary  Athlete's foot is a fungal infection of the skin on your feet.  Symptoms include  itchy or white and flaky areas on the skin.  Apply your antifungal medicine as told by your doctor.  Keep your feet clean and dry. This information is not intended to replace advice given to you by your health care provider. Make sure you discuss any questions you have with your health care provider. Document Revised: 09/15/2019 Document Reviewed: 09/15/2019 Elsevier Patient Education  2021 [employer].

## 2020-03-09 NOTE — Progress Notes (Signed)
Patient: Leah [name] MRN: [id number] DOB: 1960/07/02 PCP: Orma Flaming, MD     Subjective:  Chief Complaint  Patient presents with  . Rash    Located majority on her right foot, but a small place on her left. Noticed 1 month ago.    HPI: The patient is a 60 y.o. female who presents today for rash located on her right foot.  She started off with it itching. She tried an athletes foot spray. She then tried another anti-fungal cream. There is also a small place on her left foot. She states having this at least 1 month or more. She has no other rashes. This rash has not spread. It doesn't really itch her anymore. It is red and scaly. She doesn't always wear socks with her shoes either.   Review of Systems  Musculoskeletal: Negative for joint swelling.  Skin: Positive for color change and rash. Negative for wound.    Allergies Patient has No Known Allergies.  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  Surgical History Patient  has a past surgical history that includes Tonsillectomy (1976); Cesarean section (5462,7035); Cholecystectomy (2006); Abdominal hysterectomy (2004); Oophorectomy (2004); Tubal ligation (Bilateral, 2001); Knee arthroscopy (Left, 09/2010); Breast biopsy (Left); Reconstruction breast w/ latissimus dorsi flap (Bilateral); Laparoscopic hepatectomy; Augmentation mammaplasty; Mastectomy (Bilateral, 2008); and Hernia repair.  Family  History Pateint's family history includes Arthritis in her maternal grandfather, maternal grandmother, paternal grandfather, and paternal grandmother; Breast cancer (age of onset: 63) in her maternal aunt; Breast cancer (age of onset: 34) in her mother; Breast cancer (age of onset: 57) in her maternal aunt; Heart failure in her father and paternal grandfather; Hyperlipidemia in her father; Hypertension in her father; Lupus in her mother; Obesity in her father; Ovarian cancer (age of onset: 58) in her maternal aunt; Sudden death in her father.  Social History Patient  reports that she has never smoked. She has never used smokeless tobacco. She reports that she does not drink alcohol and does not use drugs.    Objective: Vitals:   03/09/20 1129  BP: (!) 140/92  Pulse: 94  Temp: (!) 96.6 F (35.9 C)  TempSrc: Temporal  SpO2: 98%  Weight: 213 lb 12.8 oz (97 kg)  Height: 4\' 11"  (1.499 m)    Body mass index is 43.18 kg/m.  Physical Exam Vitals reviewed.  Constitutional:      Appearance: Normal appearance. She is obese.  HENT:     Head: Normocephalic and atraumatic.  Pulmonary:     Effort: Pulmonary effort is normal.  Skin:    Findings: Erythema and rash (she has erythematous rash with well defined border on top of her right foot with scaling. small patch on top of her left foot and in between her first and second toe. ) present.  Neurological:     Mental Status: She is alert.        Assessment/plan: 1. Tinea pedis of both feet Topical clotrimazole bid and continue one week after rash goes away. Oral diflucan x 2 weeks. Discussed hygiene with socks, keeping dry, etc.  This visit occurred during the SARS-CoV-2 public health emergency.  Safety protocols were in place, including screening questions prior to the visit, additional usage of staff PPE, and extensive cleaning of exam room while observing appropriate contact time as indicated for disinfecting solutions.    Return  if symptoms worsen or fail to improve.    Orma Flaming, MD [place]   03/09/2020

## 2020-03-16 ENCOUNTER — Encounter: Payer: Self-pay | Admitting: Family Medicine

## 2020-03-19 ENCOUNTER — Other Ambulatory Visit: Payer: Self-pay | Admitting: Family Medicine

## 2020-03-19 DIAGNOSIS — J301 Allergic rhinitis due to pollen: Secondary | ICD-10-CM

## 2020-03-20 ENCOUNTER — Other Ambulatory Visit: Payer: Self-pay | Admitting: Family Medicine

## 2020-03-20 DIAGNOSIS — M4722 Other spondylosis with radiculopathy, cervical region: Secondary | ICD-10-CM

## 2020-03-22 ENCOUNTER — Other Ambulatory Visit: Payer: Self-pay

## 2020-03-22 ENCOUNTER — Inpatient Hospital Stay: Payer: 59 | Attending: Hematology & Oncology

## 2020-03-22 ENCOUNTER — Inpatient Hospital Stay: Payer: 59

## 2020-03-22 VITALS — BP 143/97 | HR 97 | Temp 98.7°F | Resp 18

## 2020-03-22 DIAGNOSIS — C7B8 Other secondary neuroendocrine tumors: Secondary | ICD-10-CM

## 2020-03-22 DIAGNOSIS — C7A00 Malignant carcinoid tumor of unspecified site: Secondary | ICD-10-CM | POA: Diagnosis present

## 2020-03-22 DIAGNOSIS — C7B02 Secondary carcinoid tumors of liver: Secondary | ICD-10-CM | POA: Insufficient documentation

## 2020-03-22 LAB — CMP (CANCER CENTER ONLY)
ALT: 24 U/L (ref 0–44)
AST: 20 U/L (ref 15–41)
Albumin: 4.3 g/dL (ref 3.5–5.0)
Alkaline Phosphatase: 71 U/L (ref 38–126)
Anion gap: 8 (ref 5–15)
BUN: 18 mg/dL (ref 8–23)
CO2: 30 mmol/L (ref 22–32)
Calcium: 10.2 mg/dL (ref 8.9–10.3)
Chloride: 100 mmol/L (ref 98–111)
Creatinine: 0.93 mg/dL (ref 0.44–1.00)
GFR, Estimated: >60 mL/min (ref >60)
Glucose, Bld: 122 mg/dL — ABNORMAL HIGH (ref 70–99)
Potassium: 4.3 mmol/L (ref 3.5–5.1)
Sodium: 138 mmol/L (ref 135–145)
Total Bilirubin: 0.5 mg/dL (ref 0.3–1.2)
Total Protein: 7.3 g/dL (ref 6.5–8.1)

## 2020-03-22 LAB — CBC WITH DIFFERENTIAL (CANCER CENTER ONLY)
Abs Immature Granulocytes: 0.08 10*3/uL — ABNORMAL HIGH (ref 0.00–0.07)
Basophils Absolute: 0.1 10*3/uL (ref 0.0–0.1)
Basophils Relative: 1 %
Eosinophils Absolute: 0.1 10*3/uL (ref 0.0–0.5)
Eosinophils Relative: 1 %
HCT: 42 % (ref 36.0–46.0)
Hemoglobin: 13.7 g/dL (ref 12.0–15.0)
Immature Granulocytes: 1 %
Lymphocytes Relative: 27 %
Lymphs Abs: 3.7 10*3/uL (ref 0.7–4.0)
MCH: 30.6 pg (ref 26.0–34.0)
MCHC: 32.6 g/dL (ref 30.0–36.0)
MCV: 94 fL (ref 80.0–100.0)
Monocytes Absolute: 1 10*3/uL (ref 0.1–1.0)
Monocytes Relative: 7 %
Neutro Abs: 8.9 10*3/uL — ABNORMAL HIGH (ref 1.7–7.7)
Neutrophils Relative %: 63 %
Platelet Count: 315 10*3/uL (ref 150–400)
RBC: 4.47 MIL/uL (ref 3.87–5.11)
RDW: 14.9 % (ref 11.5–15.5)
WBC Count: 13.9 10*3/uL — ABNORMAL HIGH (ref 4.0–10.5)
nRBC: 0 % (ref 0.0–0.2)

## 2020-03-22 LAB — LACTATE DEHYDROGENASE: LDH: 213 U/L — ABNORMAL HIGH (ref 98–192)

## 2020-03-22 MED ORDER — LANREOTIDE ACETATE 120 MG/0.5ML ~~LOC~~ SOLN
120.0000 mg | Freq: Once | SUBCUTANEOUS | Status: AC
  Administered 2020-03-22: 120 mg via SUBCUTANEOUS

## 2020-03-22 NOTE — Patient Instructions (Signed)
Lanreotide injection What is this medicine? LANREOTIDE (lan REE oh tide) is used to reduce blood levels of growth hormone in patients with a condition called acromegaly. It also works to slow or stop tumor growth in patients with neuroendocrine tumors and treat carcinoid syndrome. This medicine may be used for other purposes; ask your health care provider or pharmacist if you have questions. COMMON BRAND NAME(S): Somatuline [place] What should I tell my health care provider before I take this medicine? They need to know if you have any of these conditions:  diabetes  gallbladder disease  heart disease  kidney disease  liver disease  thyroid disease  an unusual or allergic reaction to lanreotide, other medicines, foods, dyes, or preservatives  pregnant or trying to get pregnant  breast-feeding How should I use this medicine? This medicine is for injection under the skin. It is given by a health care professional in a hospital or clinic setting. Contact your pediatrician or health care professional regarding the use of this medicine in children. Special care may be needed. Overdosage: If you think you have taken too much of this medicine contact a poison control center or emergency room at once. NOTE: This medicine is only for you. Do not share this medicine with others. What if I miss a dose? It is important not to miss your dose. Call your doctor or health care professional if you are unable to keep an appointment. What may interact with this medicine? This medicine may interact with the following medications:  bromocriptine  cyclosporine  certain medicines for blood pressure, heart disease, irregular heart beat  certain medicines for diabetes  quinidine  terfenadine This list may not describe all possible interactions. Give your health care provider a list of all the medicines, herbs, non-prescription drugs, or dietary supplements you use. Also tell them if you smoke,  drink alcohol, or use illegal drugs. Some items may interact with your medicine. What should I watch for while using this medicine? Tell your doctor or healthcare professional if your symptoms do not start to get better or if they get worse. Visit your doctor or health care professional for regular checks on your progress. Your condition will be monitored carefully while you are receiving this medicine. This medicine may increase blood sugar. Ask your healthcare provider if changes in diet or medicines are needed if you have diabetes. You may need blood work done while you are taking this medicine. Women should inform their doctor if they wish to become pregnant or think they might be pregnant. There is a potential for serious side effects to an unborn child. Talk to your health care professional or pharmacist for more information. Do not breast-feed an infant while taking this medicine or for 6 months after stopping it. This medicine has caused ovarian failure in some women. This medicine may interfere with the ability to have a child. Talk with your doctor or health care professional if you are concerned about your fertility. What side effects may I notice from receiving this medicine? Side effects that you should report to your doctor or health care professional as soon as possible:  allergic reactions like skin rash, itching or hives, swelling of the face, lips, or tongue  increased blood pressure  severe stomach pain  signs and symptoms of hgh blood sugar such as being more thirsty or hungry or having to urinate more than normal. You may also feel very tired or have blurry vision.  signs and symptoms of low blood   sugar such as feeling anxious; confusion; dizziness; increased hunger; unusually weak or tired; sweating; shakiness; cold; irritable; headache; blurred vision; fast heartbeat; loss of consciousness  unusually slow heartbeat Side effects that usually do not require medical  attention (report to your doctor or health care professional if they continue or are bothersome):  constipation  diarrhea  dizziness  headache  muscle pain  muscle spasms  nausea  pain, redness, or irritation at site where injected This list may not describe all possible side effects. Call your doctor for medical advice about side effects. You may report side effects to FDA at 1-800-FDA-1088. Where should I keep my medicine? This drug is given in a hospital or clinic and will not be stored at home. NOTE: This sheet is a summary. It may not cover all possible information. If you have questions about this medicine, talk to your doctor, pharmacist, or health care provider.  2021 Elsevier/Gold Standard (2017-11-05 09:13:08)  

## 2020-03-23 LAB — CHROMOGRANIN A: Chromogranin A (ng/mL): 110.2 ng/mL — ABNORMAL HIGH (ref 0.0–101.8)

## 2020-03-26 ENCOUNTER — Ambulatory Visit (INDEPENDENT_AMBULATORY_CARE_PROVIDER_SITE_OTHER): Payer: 59 | Admitting: Family Medicine

## 2020-03-28 ENCOUNTER — Encounter (INDEPENDENT_AMBULATORY_CARE_PROVIDER_SITE_OTHER): Payer: Self-pay

## 2020-03-28 ENCOUNTER — Other Ambulatory Visit: Payer: Self-pay | Admitting: Family Medicine

## 2020-03-28 ENCOUNTER — Other Ambulatory Visit (INDEPENDENT_AMBULATORY_CARE_PROVIDER_SITE_OTHER): Payer: Self-pay | Admitting: Family Medicine

## 2020-03-28 DIAGNOSIS — F419 Anxiety disorder, unspecified: Secondary | ICD-10-CM

## 2020-03-28 DIAGNOSIS — E1169 Type 2 diabetes mellitus with other specified complication: Secondary | ICD-10-CM

## 2020-03-28 NOTE — Telephone Encounter (Signed)
Message sent to pt.

## 2020-03-28 NOTE — Telephone Encounter (Signed)
Dr.Wallace °

## 2020-03-28 NOTE — Telephone Encounter (Signed)
na

## 2020-04-03 ENCOUNTER — Ambulatory Visit (INDEPENDENT_AMBULATORY_CARE_PROVIDER_SITE_OTHER): Payer: 59 | Admitting: Family Medicine

## 2020-04-03 ENCOUNTER — Other Ambulatory Visit: Payer: Self-pay

## 2020-04-03 ENCOUNTER — Encounter (INDEPENDENT_AMBULATORY_CARE_PROVIDER_SITE_OTHER): Payer: Self-pay | Admitting: Family Medicine

## 2020-04-03 VITALS — BP 124/83 | HR 85 | Temp 98.5°F | Ht 59.0 in | Wt 211.0 lb

## 2020-04-03 DIAGNOSIS — I10 Essential (primary) hypertension: Secondary | ICD-10-CM

## 2020-04-03 DIAGNOSIS — E1169 Type 2 diabetes mellitus with other specified complication: Secondary | ICD-10-CM

## 2020-04-03 DIAGNOSIS — Z6841 Body Mass Index (BMI) 40.0 and over, adult: Secondary | ICD-10-CM | POA: Diagnosis not present

## 2020-04-05 ENCOUNTER — Other Ambulatory Visit: Payer: Self-pay

## 2020-04-05 ENCOUNTER — Ambulatory Visit (INDEPENDENT_AMBULATORY_CARE_PROVIDER_SITE_OTHER): Payer: 59 | Admitting: Plastic Surgery

## 2020-04-05 DIAGNOSIS — Z9013 Acquired absence of bilateral breasts and nipples: Secondary | ICD-10-CM

## 2020-04-05 DIAGNOSIS — C50919 Malignant neoplasm of unspecified site of unspecified female breast: Secondary | ICD-10-CM | POA: Diagnosis not present

## 2020-04-05 NOTE — Progress Notes (Signed)
Chief Complaint:   OBESITY Leah [name] is here to discuss her progress with her obesity treatment plan along with follow-up of her obesity related diagnoses. Leah [name] is on the Category 2 Plan and states she is following her eating plan approximately 60% of the time. Leah [name] states she is doing extra walking.  Today's visit was #: 11 Starting weight: 226 lbs Starting date: 07/07/2019 Today's weight: 211 lbs Today's date: 04/03/2020 Total lbs lost to date: 15 lbs Total lbs lost since last in-office visit: 2 lbs  Interim History: Pt has had an episode of vertigo in the last few weeks that lasted a few days. During that time, she had to allow for some indulgent eating. Category 2 plan isn't difficult but putting the plan into practice is occasionally difficult with people that are living in house.  Subjective:   1. Type 2 diabetes mellitus with other specified complication, without long-term current use of insulin (HCC) Leah [name]'s last A1c was 6.4. She is on Metformin BID with no GI side effects, and Ozempic with no side effects.  2. Essential hypertension Leah [name]'s BP is controlled today.  Pt denies chest pain, chest pressure and headache. She is on amlodipine, Toprol, and Diovan.  BP Readings from Last 3 Encounters:  04/03/20 124/83  03/22/20 (!) 143/97  03/09/20 (!) 140/92    Assessment/Plan:   1. Type 2 diabetes mellitus with other specified complication, without long-term current use of insulin (HCC) Good blood sugar control is important to decrease the likelihood of diabetic complications such as nephropathy, neuropathy, limb loss, blindness, coronary artery disease, and death. Intensive lifestyle modification including diet, exercise and weight loss are the first line of treatment for diabetes. Continue current treatment plan.  2. Essential hypertension Leah [name] is working on healthy weight loss and exercise to improve blood pressure control. We will watch for signs of hypotension as  she continues her lifestyle modifications. Continue current treatment plan with no change in dose.  3. Class 3 severe obesity with serious comorbidity and body mass index (BMI) of 40.0 to 44.9 in adult, unspecified obesity type (HCC) Leah [name] is currently in the action stage of change. As such, her goal is to continue with weight loss efforts. She has agreed to the Category 2 Plan.   Exercise goals: All adults should avoid inactivity. Some physical activity is better than none, and adults who participate in any amount of physical activity gain some health benefits.  Behavioral modification strategies: increasing lean protein intake, meal planning and cooking strategies, keeping healthy foods in the home and dealing with family or coworker sabotage.  Leah [name] has agreed to follow-up with our clinic in 2-3 weeks. She was informed of the importance of frequent follow-up visits to maximize her success with intensive lifestyle modifications for her multiple health conditions.   Objective:   Blood pressure 124/83, pulse 85, temperature 98.5 F (36.9 C), temperature source Oral, height 4\' 11"  (1.499 m), weight 211 lb (95.7 kg), SpO2 98 %. Body mass index is 42.62 kg/m.  General: Cooperative, alert, well developed, in no acute distress. HEENT: Conjunctivae and lids unremarkable. Cardiovascular: Regular rhythm.  Lungs: Normal work of breathing. Neurologic: No focal deficits.   Lab Results  Component Value Date   CREATININE 0.93 03/22/2020   BUN 18 03/22/2020   NA 138 03/22/2020   K 4.3 03/22/2020   CL 100 03/22/2020   CO2 30 03/22/2020   Lab Results  Component Value Date   ALT 24 03/22/2020   AST 20 03/22/2020  ALKPHOS 71 03/22/2020   BILITOT 0.5 03/22/2020   Lab Results  Component Value Date   HGBA1C 6.4 (H) 03/05/2020   HGBA1C 6.8 (H) 11/02/2019   HGBA1C 7.3 (H) 07/06/2019   HGBA1C 7.1 (H) 12/24/2018   HGBA1C 8.1 (A) 08/31/2018   No results found for: INSULIN Lab Results   Component Value Date   TSH 2.720 03/05/2020   Lab Results  Component Value Date   CHOL 140 11/02/2019   HDL 58 11/02/2019   LDLCALC 63 11/02/2019   TRIG 111 11/02/2019   CHOLHDL 2.4 11/02/2019   Lab Results  Component Value Date   WBC 13.9 (H) 03/22/2020   HGB 13.7 03/22/2020   HCT 42.0 03/22/2020   MCV 94.0 03/22/2020   PLT 315 03/22/2020   Lab Results  Component Value Date   IRON <10 (L) 10/18/2008   TIBC NOT CALC Not calculated due to Iron <10. 10/18/2008   FERRITIN 382 (H) 10/18/2008    Attestation Statements:   Reviewed by clinician on day of visit: allergies, medications, problem list, medical history, surgical history, family history, social history, and previous encounter notes.  Coral Ceo, am acting as transcriptionist for Coralie Common, MD.   I have reviewed the above documentation for accuracy and completeness, and I agree with the above. - Jinny Blossom, MD

## 2020-04-05 NOTE — Progress Notes (Signed)
Referring Provider Orma Flaming, MD [place],  [place] [address]   CC:  Chief Complaint  Patient presents with  . Advice Only      Leah [name] is an 60 y.o. female.  HPI: Patient presents to discuss a concern regarding her bilateral breast reconstruction.  She had bilateral mastectomies years ago followed by implant-based reconstruction combined with bilateral latissimus flap.  Her primary concern is development of significant lateral and axillary soft tissue.  This impedes her ability to put her arms down and distorts the appearance of her reconstructed breast.  She is interested see if this area could be contoured.  She also has upcoming appointments for nipple tattooing.  No Known Allergies  Outpatient Encounter Medications as of 04/05/2020  Medication Sig  . Acyclovir-Hydrocortisone (XERESE) 5-1 % CREA Xerese 5 %-1 % topical cream  . amLODipine (NORVASC) 10 MG tablet TAKE 1 TABLET BY MOUTH EVERY DAY  . aspirin 81 MG EC tablet TAKE 1 TABLET BY MOUTH EVERY DAY *SWALLOW WHOLE*  . clonazePAM (KLONOPIN) 0.5 MG tablet TAKE 1 TABLET BY MOUTH TWICE A DAY AS NEEDED FOR ANXIETY  . fexofenadine (ALLEGRA) 180 MG tablet TAKE 1 TABLET BY MOUTH EVERY DAY  . fluconazole (DIFLUCAN) 150 MG tablet Take 1 tablet (150 mg total) by mouth once a week. X 3 weeks  . gabapentin (NEURONTIN) 100 MG capsule TAKE 1 CAPSULE (100 MG TOTAL) BY MOUTH AT BEDTIME.  . metFORMIN (GLUCOPHAGE) 500 MG tablet Take 1 tablet (500 mg total) by mouth 2 (two) times daily with a meal.  . metoprolol succinate (TOPROL-XL) 100 MG 24 hr tablet TAKE 1 TABLET BY MOUTH EVERY EVENING WITH OR IMMEDIATELY FOLLOWING A MEAL  . montelukast (SINGULAIR) 10 MG tablet TAKE 1 TABLET BY MOUTH EVERYDAY AT BEDTIME  . moxifloxacin (VIGAMOX) 0.5 % ophthalmic solution   . naproxen (NAPROSYN) 500 MG tablet TAKE 1 TABLET TWICE A DAY WITH A MEAL  . octreotide (SANDOSTATIN LAR) 30 MG injection Inject 30 mg into the muscle every 28  (twenty-eight) days.  . ondansetron (ZOFRAN ODT) 4 MG disintegrating tablet Take 1 tablet (4 mg total) by mouth every 8 (eight) hours as needed for nausea or vomiting.  . ondansetron (ZOFRAN) 4 MG tablet TAKE 1 TABLET BY MOUTH EVERY 8 HOURS AS NEEDED FOR NAUSEA OR VOMITING  . oxyCODONE (OXY IR/ROXICODONE) 5 MG immediate release tablet oxycodone 5 mg tablet  . pantoprazole (PROTONIX) 40 MG tablet TAKE 1 TABLET BY MOUTH TWICE A DAY  . PROAIR HFA 108 (90 Base) MCG/ACT inhaler INHALE 2 PUFFS INTO THE LUNGS EVERY 6 (SIX) HOURS AS NEEDED. FOR SHORTNESS OF BREATH. (Patient taking differently: Inhale 2 puffs into the lungs every 6 (six) hours as needed for wheezing or shortness of breath.)  . rosuvastatin (CRESTOR) 10 MG tablet Take 1 tablet (10 mg total) by mouth daily.  . Semaglutide,0.25 or 0.5MG /DOS, (OZEMPIC, 0.25 OR 0.5 MG/DOSE,) 2 MG/1.5ML SOPN Inject 0.5 mg into the skin once a week.  . valACYclovir (VALTREX) 1000 MG tablet Take by mouth.  . valsartan (DIOVAN) 320 MG tablet TAKE 1 TABLET BY MOUTH EVERY DAY  . venlafaxine XR (EFFEXOR-XR) 150 MG 24 hr capsule TAKE 1 CAPSULE BY MOUTH EACH MORNING   No facility-administered encounter medications on file as of 04/05/2020.     Past Medical History:  Diagnosis Date  . Allergic rhinitis   . Atrophic vaginitis   . Back pain   . Borderline diabetes 03/17/2013  . Carcinoid syndrome (  HCC)   . Constipation   . Diabetes ([place])   . Dyspnea   . Fatty liver   . Gallbladder problem   . GE reflux   . Hepatic encephalopathy ([place]) 2011  . History of PCR DNA positive for HSV1   . History of retinal vein occlusion, right eye   . HTN (hypertension) 2011  . Joint pain   . Liver problem   . Migraines   . Neuroendocrine tumor 2011  . Orthostatic hypotension   . Osteoarthritis   . Osteopenia   . Palpitations   . Sarcoidosis 2008  . Sleep apnea   . Surgical menopause     Past Surgical History:  Procedure Laterality Date  . ABDOMINAL HYSTERECTOMY  2004   . AUGMENTATION MAMMAPLASTY    . BREAST BIOPSY Left   . CESAREAN SECTION  [id number]  . CHOLECYSTECTOMY  2006  . HERNIA REPAIR    . KNEE ARTHROSCOPY Left 09/2010  . LAPAROSCOPIC HEPATECTOMY    . MASTECTOMY Bilateral 2008  . OOPHORECTOMY  2004  . RECONSTRUCTION BREAST W/ LATISSIMUS DORSI FLAP Bilateral   . TONSILLECTOMY  1976  . TUBAL LIGATION Bilateral 2001    Family History  Problem Relation Age of Onset  . Arthritis Maternal Grandmother   . Arthritis Maternal Grandfather   . Arthritis Paternal Grandmother   . Arthritis Paternal Grandfather   . Heart failure Paternal Grandfather   . Breast cancer Mother 33  . Lupus Mother   . Heart failure Father   . Hypertension Father   . Hyperlipidemia Father   . Sudden death Father   . Obesity Father   . Breast cancer Maternal Aunt 38  . Ovarian cancer Maternal Aunt 60  . Breast cancer Maternal Aunt 2    Social History   Social History Narrative   Married to [place]. 2 sons (2001) Kasandra Knudsen and Cristie Hem (1999). Has dog. 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     Review of Systems General: Denies fevers, chills, weight loss CV: Denies chest pain, shortness of breath, palpitations  Physical Exam Vitals with BMI 04/03/2020 03/22/2020 03/09/2020  Height 4\' 11"  - 4\' 11"   Weight 211 lbs - 213 lbs 13 oz  BMI 98.92 - 11.94  Systolic 174 081 448  Diastolic 83 97 92  Pulse 85 97 94    General:  No acute distress,  Alert and oriented, Non-Toxic, Normal speech and affect Examination shows bilateral breast reconstruction with significant lateral breast and axillary soft tissue.  She has had nipple reconstruction with what looks like a flap but the area needs to be pigmented through the tattoo.  She has widened scars on her back consistent with latissimus flap harvest.  Assessment/Plan Patient presents with contour irregularity after bilateral breast reconstruction.  She has  significant excess in the lateral breast and axillary areas on both sides.  I think the only way to directly address this would be to directly excise it.  She would need to be on her side and then flipped over so that the scar could be taken around and will more than likely meet her latissimus scar.  We discussed the details of the operation and the risk that include bleeding, infection, demonstrating structures need for additional procedures.  I explained I would need to extend the scar all along her side but I felt like this would take care of the issue.  We discussed the anticipated postoperative recovery and  she is interested in moving forward.  All of her questions were answered.  Cindra Presume 04/05/2020, 4:00 PM

## 2020-04-06 ENCOUNTER — Other Ambulatory Visit: Payer: Self-pay | Admitting: Family Medicine

## 2020-04-11 ENCOUNTER — Other Ambulatory Visit: Payer: Self-pay | Admitting: Family Medicine

## 2020-04-11 DIAGNOSIS — I10 Essential (primary) hypertension: Secondary | ICD-10-CM

## 2020-04-18 ENCOUNTER — Other Ambulatory Visit: Payer: Self-pay | Admitting: *Deleted

## 2020-04-18 DIAGNOSIS — C7B8 Other secondary neuroendocrine tumors: Secondary | ICD-10-CM

## 2020-04-19 ENCOUNTER — Inpatient Hospital Stay: Payer: 59

## 2020-04-19 ENCOUNTER — Other Ambulatory Visit: Payer: Self-pay

## 2020-04-19 ENCOUNTER — Inpatient Hospital Stay: Payer: 59 | Attending: Hematology & Oncology

## 2020-04-19 VITALS — BP 101/75 | HR 88 | Temp 99.1°F | Resp 18

## 2020-04-19 DIAGNOSIS — C7A00 Malignant carcinoid tumor of unspecified site: Secondary | ICD-10-CM | POA: Insufficient documentation

## 2020-04-19 DIAGNOSIS — C7B02 Secondary carcinoid tumors of liver: Secondary | ICD-10-CM | POA: Diagnosis not present

## 2020-04-19 DIAGNOSIS — C7B8 Other secondary neuroendocrine tumors: Secondary | ICD-10-CM

## 2020-04-19 LAB — COMPREHENSIVE METABOLIC PANEL
ALT: 18 U/L (ref 0–44)
AST: 18 U/L (ref 15–41)
Albumin: 4.2 g/dL (ref 3.5–5.0)
Alkaline Phosphatase: 75 U/L (ref 38–126)
Anion gap: 8 (ref 5–15)
BUN: 15 mg/dL (ref 8–23)
CO2: 26 mmol/L (ref 22–32)
Calcium: 9.3 mg/dL (ref 8.9–10.3)
Chloride: 103 mmol/L (ref 98–111)
Creatinine, Ser: 0.79 mg/dL (ref 0.44–1.00)
GFR, Estimated: >60 mL/min (ref >60)
Glucose, Bld: 185 mg/dL — ABNORMAL HIGH (ref 70–99)
Potassium: 4.1 mmol/L (ref 3.5–5.1)
Sodium: 137 mmol/L (ref 135–145)
Total Bilirubin: 0.5 mg/dL (ref 0.3–1.2)
Total Protein: 7 g/dL (ref 6.5–8.1)

## 2020-04-19 LAB — CBC WITH DIFFERENTIAL (CANCER CENTER ONLY)
Abs Immature Granulocytes: 0.05 10*3/uL (ref 0.00–0.07)
Basophils Absolute: 0.1 10*3/uL (ref 0.0–0.1)
Basophils Relative: 1 %
Eosinophils Absolute: 0.1 10*3/uL (ref 0.0–0.5)
Eosinophils Relative: 1 %
HCT: 40.4 % (ref 36.0–46.0)
Hemoglobin: 13.2 g/dL (ref 12.0–15.0)
Immature Granulocytes: 0 %
Lymphocytes Relative: 27 %
Lymphs Abs: 3 10*3/uL (ref 0.7–4.0)
MCH: 30.9 pg (ref 26.0–34.0)
MCHC: 32.7 g/dL (ref 30.0–36.0)
MCV: 94.6 fL (ref 80.0–100.0)
Monocytes Absolute: 0.7 10*3/uL (ref 0.1–1.0)
Monocytes Relative: 6 %
Neutro Abs: 7.5 10*3/uL (ref 1.7–7.7)
Neutrophils Relative %: 65 %
Platelet Count: 319 10*3/uL (ref 150–400)
RBC: 4.27 MIL/uL (ref 3.87–5.11)
RDW: 14.7 % (ref 11.5–15.5)
WBC Count: 11.5 10*3/uL — ABNORMAL HIGH (ref 4.0–10.5)
nRBC: 0 % (ref 0.0–0.2)

## 2020-04-19 LAB — LACTATE DEHYDROGENASE: LDH: 181 U/L (ref 98–192)

## 2020-04-19 MED ORDER — LANREOTIDE ACETATE 120 MG/0.5ML ~~LOC~~ SOLN
120.0000 mg | Freq: Once | SUBCUTANEOUS | Status: AC
  Administered 2020-04-19: 120 mg via SUBCUTANEOUS

## 2020-04-19 NOTE — Patient Instructions (Signed)
Lanreotide injection (Somatuline) What is this medicine? LANREOTIDE (lan REE oh tide) is used to reduce blood levels of growth hormone in patients with a condition called acromegaly. It also works to slow or stop tumor growth in patients with neuroendocrine tumors and treat carcinoid syndrome. This medicine may be used for other purposes; ask your health care provider or pharmacist if you have questions. COMMON BRAND NAME(S): Somatuline [place] What should I tell my health care provider before I take this medicine? They need to know if you have any of these conditions:  diabetes  gallbladder disease  heart disease  kidney disease  liver disease  thyroid disease  an unusual or allergic reaction to lanreotide, other medicines, foods, dyes, or preservatives  pregnant or trying to get pregnant  breast-feeding How should I use this medicine? This medicine is for injection under the skin. It is given by a health care professional in a hospital or clinic setting. Contact your pediatrician or health care professional regarding the use of this medicine in children. Special care may be needed. Overdosage: If you think you have taken too much of this medicine contact a poison control center or emergency room at once. NOTE: This medicine is only for you. Do not share this medicine with others. What if I miss a dose? It is important not to miss your dose. Call your doctor or health care professional if you are unable to keep an appointment. What may interact with this medicine? This medicine may interact with the following medications:  bromocriptine  cyclosporine  certain medicines for blood pressure, heart disease, irregular heart beat  certain medicines for diabetes  quinidine  terfenadine This list may not describe all possible interactions. Give your health care provider a list of all the medicines, herbs, non-prescription drugs, or dietary supplements you use. Also tell them if  you smoke, drink alcohol, or use illegal drugs. Some items may interact with your medicine. What should I watch for while using this medicine? Tell your doctor or healthcare professional if your symptoms do not start to get better or if they get worse. Visit your doctor or health care professional for regular checks on your progress. Your condition will be monitored carefully while you are receiving this medicine. This medicine may increase blood sugar. Ask your healthcare provider if changes in diet or medicines are needed if you have diabetes. You may need blood work done while you are taking this medicine. Women should inform their doctor if they wish to become pregnant or think they might be pregnant. There is a potential for serious side effects to an unborn child. Talk to your health care professional or pharmacist for more information. Do not breast-feed an infant while taking this medicine or for 6 months after stopping it. This medicine has caused ovarian failure in some women. This medicine may interfere with the ability to have a child. Talk with your doctor or health care professional if you are concerned about your fertility. What side effects may I notice from receiving this medicine? Side effects that you should report to your doctor or health care professional as soon as possible:  allergic reactions like skin rash, itching or hives, swelling of the face, lips, or tongue  increased blood pressure  severe stomach pain  signs and symptoms of hgh blood sugar such as being more thirsty or hungry or having to urinate more than normal. You may also feel very tired or have blurry vision.  signs and symptoms of low   blood sugar such as feeling anxious; confusion; dizziness; increased hunger; unusually weak or tired; sweating; shakiness; cold; irritable; headache; blurred vision; fast heartbeat; loss of consciousness  unusually slow heartbeat Side effects that usually do not require  medical attention (report to your doctor or health care professional if they continue or are bothersome):  constipation  diarrhea  dizziness  headache  muscle pain  muscle spasms  nausea  pain, redness, or irritation at site where injected This list may not describe all possible side effects. Call your doctor for medical advice about side effects. You may report side effects to FDA at 1-800-FDA-1088. Where should I keep my medicine? This drug is given in a hospital or clinic and will not be stored at home. NOTE: This sheet is a summary. It may not cover all possible information. If you have questions about this medicine, talk to your doctor, pharmacist, or health care provider.  2021 Elsevier/Gold Standard (2017-11-05 09:13:08)  

## 2020-04-20 LAB — CHROMOGRANIN A: Chromogranin A (ng/mL): 119.6 ng/mL — ABNORMAL HIGH (ref 0.0–101.8)

## 2020-04-24 ENCOUNTER — Ambulatory Visit (INDEPENDENT_AMBULATORY_CARE_PROVIDER_SITE_OTHER): Payer: 59 | Admitting: Family Medicine

## 2020-04-24 ENCOUNTER — Other Ambulatory Visit: Payer: Self-pay

## 2020-04-24 ENCOUNTER — Encounter (INDEPENDENT_AMBULATORY_CARE_PROVIDER_SITE_OTHER): Payer: Self-pay | Admitting: Family Medicine

## 2020-04-24 VITALS — BP 152/95 | HR 77 | Temp 98.0°F | Ht 59.0 in | Wt 210.0 lb

## 2020-04-24 DIAGNOSIS — F3289 Other specified depressive episodes: Secondary | ICD-10-CM

## 2020-04-24 DIAGNOSIS — Z6841 Body Mass Index (BMI) 40.0 and over, adult: Secondary | ICD-10-CM

## 2020-04-24 DIAGNOSIS — E1159 Type 2 diabetes mellitus with other circulatory complications: Secondary | ICD-10-CM

## 2020-04-24 DIAGNOSIS — E1169 Type 2 diabetes mellitus with other specified complication: Secondary | ICD-10-CM

## 2020-04-24 DIAGNOSIS — Z9189 Other specified personal risk factors, not elsewhere classified: Secondary | ICD-10-CM

## 2020-04-24 DIAGNOSIS — I152 Hypertension secondary to endocrine disorders: Secondary | ICD-10-CM

## 2020-04-26 ENCOUNTER — Telehealth: Payer: Self-pay

## 2020-04-26 MED ORDER — ONDANSETRON HCL 4 MG PO TABS: 4.0000 mg | ORAL_TABLET | Freq: Three times a day (TID) | ORAL | 0 refills | Status: DC | PRN

## 2020-04-26 NOTE — Telephone Encounter (Signed)
Pt pharmacy ([facility]) requesting refill for Zofran 4 mg tab. #[phone] 0 refills LOV: 02/18/2020 Next Vist: 05/03/2020 Last refill: 04/06/2020  Approve?

## 2020-04-28 ENCOUNTER — Other Ambulatory Visit (INDEPENDENT_AMBULATORY_CARE_PROVIDER_SITE_OTHER): Payer: Self-pay | Admitting: Family Medicine

## 2020-04-28 ENCOUNTER — Other Ambulatory Visit: Payer: Self-pay | Admitting: Family Medicine

## 2020-04-28 DIAGNOSIS — E1169 Type 2 diabetes mellitus with other specified complication: Secondary | ICD-10-CM

## 2020-04-28 DIAGNOSIS — M4722 Other spondylosis with radiculopathy, cervical region: Secondary | ICD-10-CM

## 2020-04-30 ENCOUNTER — Ambulatory Visit: Payer: 59

## 2020-04-30 ENCOUNTER — Encounter (HOSPITAL_BASED_OUTPATIENT_CLINIC_OR_DEPARTMENT_OTHER): Payer: Self-pay | Admitting: Plastic Surgery

## 2020-04-30 ENCOUNTER — Other Ambulatory Visit: Payer: Self-pay

## 2020-04-30 NOTE — Telephone Encounter (Signed)
Last Refill 02/17/2020 Last OV 03/09/2020 dx Tinea pedis both feet

## 2020-05-01 ENCOUNTER — Other Ambulatory Visit: Payer: Self-pay

## 2020-05-01 ENCOUNTER — Encounter: Payer: Self-pay | Admitting: Surgical

## 2020-05-01 ENCOUNTER — Ambulatory Visit (INDEPENDENT_AMBULATORY_CARE_PROVIDER_SITE_OTHER): Payer: 59 | Admitting: Surgical

## 2020-05-01 ENCOUNTER — Telehealth: Payer: Self-pay

## 2020-05-01 VITALS — BP 146/90 | HR 84 | Ht 59.0 in | Wt 213.6 lb

## 2020-05-01 DIAGNOSIS — Z9013 Acquired absence of bilateral breasts and nipples: Secondary | ICD-10-CM

## 2020-05-01 DIAGNOSIS — C50919 Malignant neoplasm of unspecified site of unspecified female breast: Secondary | ICD-10-CM

## 2020-05-01 MED ORDER — ENOXAPARIN SODIUM 40 MG/0.4ML ~~LOC~~ SOLN: 40.0000 mg | SUBCUTANEOUS | 0 refills | Status: DC

## 2020-05-01 MED ORDER — ONDANSETRON HCL 4 MG PO TABS: 4.0000 mg | ORAL_TABLET | Freq: Three times a day (TID) | ORAL | 0 refills | Status: DC | PRN

## 2020-05-01 MED ORDER — HYDROCODONE-ACETAMINOPHEN 7.5-325 MG PO TABS
1.0000 | ORAL_TABLET | Freq: Four times a day (QID) | ORAL | 0 refills | Status: AC | PRN
Start: 2020-05-01 — End: 2020-05-06

## 2020-05-01 MED ORDER — SULFAMETHOXAZOLE-TRIMETHOPRIM 800-160 MG PO TABS: 1.0000 | ORAL_TABLET | Freq: Two times a day (BID) | ORAL | 0 refills | Status: AC

## 2020-05-01 NOTE — Progress Notes (Signed)
Chief Complaint:   OBESITY Leah [name] is here to discuss her progress with her obesity treatment plan along with follow-up of her obesity related diagnoses.   Today's visit was #: 12 Starting weight: 226 lbs Starting date: 07/07/2019 Today's weight: 210 lbs Today's date: 04/24/2020 Total lbs lost to date: 16 lbs Body mass index is 42.41 kg/m.  Total weight loss percentage to date: -7.08%  Interim History:  Leah [name] is having breast surgery on 05/07/2020.  She recently had a biopsy done on her right foot for a rash.  Current Meal Plan: the Category 2 Plan for 80% of the time.  Current Exercise Plan: None. Current Anti-Obesity Medications: Ozempic 0.5 mg subcutaneously weekly. Side effects: None.  Assessment/Plan:   1. Type 2 diabetes mellitus with other specified complication, without long-term current use of insulin (HCC) Diabetes Mellitus: Controlled. Medication: metformin 500 mg twice daily, Ozempic 0.5 mg subcutaneously weekly. Issues reviewed: blood sugar goals, complications of diabetes mellitus, hypoglycemia prevention and treatment, exercise, and nutrition.   Plan: The importance of regular follow up with PCP and all other specialists as scheduled was stressed to patient today.  Lab Results  Component Value Date   HGBA1C 6.4 (H) 03/05/2020   HGBA1C 6.8 (H) 11/02/2019   HGBA1C 7.3 (H) 07/06/2019   Lab Results  Component Value Date   MICROALBUR 0.9 07/06/2019   LDLCALC 63 11/02/2019   CREATININE 0.79 04/19/2020   2. Hypertension associated with diabetes ([place]) Not at goal. Medications: Norvasc 10 mg daily, metoprolol 100 mg daily, valsartan 320 mg daily.   Plan: Avoid buying foods that are: processed, frozen, or prepackaged to avoid excess salt. We will continue to monitor closely alongside her PCP and/or Specialist.  Regular follow up with PCP and specialists was also encouraged.   BP Readings from Last 3 Encounters:  04/24/20 (!) 152/95  04/19/20 101/75  04/03/20  124/83   Lab Results  Component Value Date   CREATININE 0.79 04/19/2020   3. Other depression with emotional eating Improving, but not optimized. Medication: Effexor-XR 150 mg daily.  Plan:  Behavior modification techniques were discussed today to help deal with emotional/non-hunger eating behaviors.  4. At risk for heart disease Due to Skyley's current state of health and medical condition(s), she is at a higher risk for heart disease.  This puts the patient at much greater risk to subsequently develop cardiopulmonary conditions that can significantly affect patient's quality of life in a negative manner.    At least 9 minutes were spent on counseling Leah [name] about these concerns today. Evidence-based interventions for health behavior change were utilized today including the discussion of self monitoring techniques, problem-solving barriers, and SMART goal setting techniques.  Specifically, regarding patient's less desirable eating habits and patterns, we employed the technique of small changes when Leah [name] has not been able to fully commit to her prudent nutritional plan.  5. Class 3 severe obesity with serious comorbidity and body mass index (BMI) of 40.0 to 44.9 in adult, unspecified obesity type ([place])  Course: Leah [name] is currently in the action stage of change. As such, her goal is to continue with weight loss efforts.   Nutrition goals: She has agreed to the Category 2 Plan.   Exercise goals: Increase activity.  Behavioral modification strategies: increasing lean protein intake, decreasing simple carbohydrates, increasing vegetables, increasing water intake and dealing with family or coworker sabotage.  Leah [name] has agreed to follow-up with our clinic in 4 weeks. She was informed of the importance of frequent  follow-up visits to maximize her success with intensive lifestyle modifications for her multiple health conditions.   Objective:   Blood pressure (!) 152/95, pulse 77, temperature  98 F (36.7 C), temperature source Oral, height 4\' 11"  (1.499 m), weight 210 lb (95.3 kg), SpO2 96 %. Body mass index is 42.41 kg/m.  General: Cooperative, alert, well developed, in no acute distress. HEENT: Conjunctivae and lids unremarkable. Cardiovascular: Regular rhythm.  Lungs: Normal work of breathing. Neurologic: No focal deficits.   Lab Results  Component Value Date   CREATININE 0.79 04/19/2020   BUN 15 04/19/2020   NA 137 04/19/2020   K 4.1 04/19/2020   CL 103 04/19/2020   CO2 26 04/19/2020   Lab Results  Component Value Date   ALT 18 04/19/2020   AST 18 04/19/2020   ALKPHOS 75 04/19/2020   BILITOT 0.5 04/19/2020   Lab Results  Component Value Date   HGBA1C 6.4 (H) 03/05/2020   HGBA1C 6.8 (H) 11/02/2019   HGBA1C 7.3 (H) 07/06/2019   HGBA1C 7.1 (H) 12/24/2018   HGBA1C 8.1 (A) 08/31/2018   Lab Results  Component Value Date   TSH 2.720 03/05/2020   Lab Results  Component Value Date   CHOL 140 11/02/2019   HDL 58 11/02/2019   LDLCALC 63 11/02/2019   TRIG 111 11/02/2019   CHOLHDL 2.4 11/02/2019   Lab Results  Component Value Date   WBC 11.5 (H) 04/19/2020   HGB 13.2 04/19/2020   HCT 40.4 04/19/2020   MCV 94.6 04/19/2020   PLT 319 04/19/2020   Lab Results  Component Value Date   IRON <10 (L) 10/18/2008   TIBC NOT CALC Not calculated due to Iron <10. 10/18/2008   FERRITIN 382 (H) 10/18/2008   Attestation Statements:   Reviewed by clinician on day of visit: allergies, medications, problem list, medical history, surgical history, family history, social history, and previous encounter notes.  I, Water quality scientist, CMA, am acting as transcriptionist for Briscoe Deutscher, DO  I have reviewed the above documentation for accuracy and completeness, and I agree with the above. Briscoe Deutscher, DO

## 2020-05-01 NOTE — Telephone Encounter (Signed)
[facility] is calling stating they have a question regarding the prescription ondansetron (ZOFRAN) 4 MG tablet, previous on ZOFRAN HCL 4 MG and now has prescription of ZOFRAN ODT 4 MG. Pharmacy has not shipped it out to patient because they are needing clarification on this before giving it to the patient.

## 2020-05-01 NOTE — H&P (View-Only) (Signed)
Patient ID: Leah [name], female    DOB: 27-Feb-1960, 60 y.o.   MRN: [id number]  Chief Complaint  Patient presents with  . Pre-op Exam      ICD-10-CM   1. History of prophylactic mastectomy of both breasts  Z90.13   2. 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  female  with a history of Bilateral breast reconstruction .  She presents for preoperative evaluation for upcoming procedure, revision of bilateral breast reconstruction, bilateral liposuction with excision of lateral breast and axillary tissue, scheduled for 05/07/2020 with Dr. Claudia Desanctis.  The patient has not had problems with anesthesia.  She reports nausea after anesthesia in the past No history of DVT/PE.  No family history of DVT/PE.  No family or personal history of bleeding or clotting disorders.  Patient is not currently taking any blood thinners.  No history of CVA/MI.   Summary of Previous Visit: Patient presents to discuss concerns regarding bilateral breast reconstruction.  She had bilateral mastectomies years ago followed by implant based reconstruction combined with bilateral latissimus flaps.  Her primary concern is development of significant lateral axillary soft tissue.  PMH Significant for: Anxiety, diabetes, carcinoid syndrome, sarcoidosis, hypertension, GE reflux, OSA on CPAP, history of metastatic malignant neuroendocrine tumor of liver-followed by oncology. Chronic pain of bilateral knees, severe obesity.  Most recent A1c 6.4.  She is currently on aspirin 81 mg.  She is planning to stop this 5 days prior to surgery. She reports overall she is doing well, no recent fevers, chills, chest pain, shortness of breath.   Past Medical History: Allergies: No Known Allergies  Current Medications:  Current Outpatient Medications:  .  amLODipine (NORVASC) 10  MG tablet, TAKE 1 TABLET BY MOUTH EVERY DAY, Disp: 90 tablet, Rfl: 1 .  aspirin 81 MG EC tablet, TAKE 1 TABLET BY MOUTH EVERY DAY *SWALLOW WHOLE*, Disp: 90 tablet, Rfl: 1 .  clonazePAM (KLONOPIN) 0.5 MG tablet, TAKE 1 TABLET BY MOUTH TWICE A DAY AS NEEDED FOR ANXIETY, Disp: 30 tablet, Rfl: 0 .  fexofenadine (ALLEGRA) 180 MG tablet, TAKE 1 TABLET BY MOUTH EVERY DAY, Disp: 90 tablet, Rfl: 3 .  gabapentin (NEURONTIN) 100 MG capsule, TAKE 1 CAPSULE (100 MG TOTAL) BY MOUTH AT BEDTIME., Disp: 90 capsule, Rfl: 0 .  HYDROcodone-acetaminophen (NORCO) 7.5-325 MG tablet, Take 1 tablet by mouth every 6 (six) hours as needed for up to 5 days for severe pain (Severe pain, post op pain only.)., Disp: 20 tablet, Rfl: 0 .  metFORMIN (GLUCOPHAGE) 500 MG tablet, Take 1 tablet (500 mg total) by mouth 2 (two) times daily with a meal., Disp: 180 tablet, Rfl: 3 .  metoprolol succinate (TOPROL-XL) 100 MG 24 hr tablet, TAKE 1 TABLET BY MOUTH EVERY EVENING WITH OR IMMEDIATELY FOLLOWING A MEAL, Disp: 90 tablet, Rfl: 1 .  montelukast (SINGULAIR) 10 MG tablet, TAKE 1 TABLET BY MOUTH EVERYDAY AT BEDTIME, Disp: 90 tablet, Rfl: 1 .  naproxen (NAPROSYN) 500 MG tablet, TAKE 1 TABLET TWICE A DAY WITH A MEAL, Disp: 180 tablet, Rfl: 0 .  octreotide (SANDOSTATIN LAR) 30 MG injection, Inject 30 mg into the muscle every 28 (twenty-eight) days., Disp: , Rfl:  .  ondansetron (ZOFRAN) 4 MG tablet, Take 1 tablet (4 mg total) by mouth every 8 (eight) hours as needed., Disp: 18 tablet, Rfl: 0 .  ondansetron (ZOFRAN) 4 MG tablet, Take 1 tablet (4 mg total) by mouth every 8 (eight) hours as needed for nausea or vomiting., Disp: 20 tablet, Rfl: 0 .  pantoprazole (PROTONIX) 40 MG tablet, TAKE 1 TABLET BY MOUTH TWICE A DAY, Disp: 180 tablet, Rfl: 0 .  PROAIR HFA 108 (90 Base) MCG/ACT inhaler, INHALE 2 PUFFS INTO THE LUNGS EVERY 6 (SIX) HOURS AS NEEDED. FOR SHORTNESS OF BREATH. (Patient taking differently: Inhale 2 puffs into the lungs every 6 (six)  hours as needed for wheezing or shortness of breath.), Disp: 6.7 Inhaler, Rfl: 4 .  rosuvastatin (CRESTOR) 10 MG tablet, Take 1 tablet (10 mg total) by mouth daily., Disp: 90 tablet, Rfl: 3 .  Semaglutide,0.25 or 0.5MG /DOS, (OZEMPIC, 0.25 OR 0.5 MG/DOSE,) 2 MG/1.5ML SOPN, Inject 0.5 mg into the skin once a week., Disp: 6 mL, Rfl: 0 .  sulfamethoxazole-trimethoprim (BACTRIM DS) 800-160 MG tablet, Take 1 tablet by mouth 2 (two) times daily for 7 days., Disp: 14 tablet, Rfl: 0 .  valACYclovir (VALTREX) 1000 MG tablet, Take by mouth., Disp: , Rfl:  .  valsartan (DIOVAN) 320 MG tablet, TAKE 1 TABLET BY MOUTH EVERY DAY, Disp: 90 tablet, Rfl: 1 .  venlafaxine XR (EFFEXOR-XR) 150 MG 24 hr capsule, TAKE 1 CAPSULE BY MOUTH EACH MORNING, Disp: 90 capsule, Rfl: 0  Past Medical Problems: Past Medical History:  Diagnosis Date  . Allergic rhinitis   . Anxiety   . Atrophic vaginitis   . Back pain   . Borderline diabetes 03/17/2013  . Carcinoid syndrome ([place])   . Constipation   . Diabetes ([place])   . Dyspnea   . Fatty liver   . Gallbladder problem   . GE reflux   . Hepatic encephalopathy ([place]) 2011  . History of PCR DNA positive for HSV1   . History of retinal vein occlusion, right eye   . HTN (hypertension) 2011  . Joint pain   . Liver problem   . Migraines   . Neuroendocrine tumor 2011  . Orthostatic hypotension   . Osteoarthritis   . Osteopenia   . Palpitations   . Sarcoidosis 2008  . Sleep apnea    uses cpap  . Surgical menopause     Past Surgical History: Past Surgical History:  Procedure Laterality Date  . ABDOMINAL HYSTERECTOMY  2004  . AUGMENTATION MAMMAPLASTY    . BREAST BIOPSY Left   . CESAREAN SECTION  [id number]  . CHOLECYSTECTOMY  2006  . HERNIA REPAIR    . KNEE ARTHROSCOPY Left 09/2010  . LAPAROSCOPIC HEPATECTOMY    . MASTECTOMY Bilateral 2008  . OOPHORECTOMY  2004  . RECONSTRUCTION BREAST W/ LATISSIMUS DORSI FLAP Bilateral   . TONSILLECTOMY  1976  . TUBAL LIGATION  Bilateral 2001    Social History: Social History   Socioeconomic History  . Marital status: Married    Spouse name: Vicente Serene   . Number of children: 2  . Years of education: Not on file  . Highest education level: Some college, no degree  Occupational History  . Occupation: [employer] Use  . Smoking status: Never Smoker  . Smokeless tobacco: Never Used  . Tobacco comment: never used tobacco  Vaping Use  . Vaping Use: Never used  Substance and Sexual Activity  . Alcohol use: No    Alcohol/week: 0.0 standard drinks  . Drug use: No  . Sexual activity: Yes    Partners: Male    Birth control/protection: Surgical  Comment: hysterectomy  Other Topics Concern  . Not on file  Social History Narrative   Married to [place]. 2 sons (2001) Kasandra Knudsen and Cristie Hem (1999). Has dog. 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   Social Determinants of Health   Financial Resource Strain: Not on file  Food Insecurity: Not on file  Transportation Needs: Not on file  Physical Activity: Not on file  Stress: Not on file  Social Connections: Not on file  Intimate Partner Violence: Not on file    Family History: Family History  Problem Relation Age of Onset  . Arthritis Maternal Grandmother   . Arthritis Maternal Grandfather   . Arthritis Paternal Grandmother   . Arthritis Paternal Grandfather   . Heart failure Paternal Grandfather   . Breast cancer Mother 76  . Lupus Mother   . Heart failure Father   . Hypertension Father   . Hyperlipidemia Father   . Sudden death Father   . Obesity Father   . Breast cancer Maternal Aunt 38  . Ovarian cancer Maternal Aunt 60  . Breast cancer Maternal Aunt 40    Review of Systems: ROS  Physical Exam: Vital Signs BP (!) 146/90 (BP Location: Right Wrist, Patient Position: Sitting, Cuff Size: Small)   Pulse 84   Ht 4\' 11"  (1.499 m)   Wt 213 lb 9.6 oz (96.9 kg)   BMI  43.14 kg/m   Physical Exam Constitutional:      General: Not in acute distress.    Appearance: Normal appearance. Not ill-appearing.  HENT:     Head: Normocephalic and atraumatic.  Eyes:     Pupils: Pupils are equal, round Neck:     Musculoskeletal: Normal range of motion.  Cardiovascular:     Rate and Rhythm: Normal rate    Pulses: Normal pulses.  Pulmonary:     Effort: Pulmonary effort is normal. No respiratory distress.  Musculoskeletal: Normal range of motion.  Skin:    General: Skin is warm and dry.     Findings: No erythema or rash.  Neurological:     General: No focal deficit present.     Mental Status: Alert and oriented to person, place, and time. Mental status is at baseline.     Motor: No weakness.  Psychiatric:        Mood and Affect: Mood normal.        Behavior: Behavior normal.    Assessment/Plan: The patient is scheduled for revision of bilateral breast reconstruction, bilateral liposuction with excision of lateral breasts and axillary tissue with Dr. Claudia Desanctis.  Risks, benefits, and alternatives of procedure discussed, questions answered and consent obtained.    Smoking Status: Non-smoker; Counseling Given?  N/A Last Mammogram: 05/27/2019; Results: No evidence of malignancy  Caprini Score: 8, high; Risk Factors include: Age, BMI greater than 40, history of cancer, and length of planned surgery. Recommendation for mechanical and pharmacological prophylaxis for 7 to 10 days postoperatively. Encourage early ambulation.  Will discuss postoperative anticoagulation with Dr. Claudia Desanctis Addendum: We will prescribe 7 days postop Lovenox 40 mg daily  Pictures obtained: 04/05/2020  Post-op Rx sent to pharmacy: Norco, Zofran, Bactrim  Patient was provided with the General Surgical Risk consent document and Pain Medication Agreement prior to their appointment.  They had adequate time to read through the risk consent documents and Pain Medication Agreement. We also discussed them in  person together during this preop appointment. All of their questions were  answered to their satisfaction.  Recommended calling if they have any further questions.  Risk consent form and Pain Medication Agreement to be scanned into patient's chart.  The risks that can be encountered with and after liposuction were discussed and include the following but no limited to these:  Asymmetry, fluid accumulation, firmness of the area, fat necrosis with death of fat tissue, bleeding, infection, delayed healing, anesthesia risks, skin sensation changes, injury to structures including nerves, blood vessels, and muscles which may be temporary or permanent, allergies to tape, suture materials and glues, blood products, topical preparations or injected agents, skin and contour irregularities, skin discoloration and swelling, deep vein thrombosis, cardiac and pulmonary complications, pain, which may persist, persistent pain, recurrence of the lesion, poor healing of the incision, possible need for revisional surgery or staged procedures. Thiere can also be persistent swelling, poor wound healing, rippling or loose skin, worsening of cellulite, swelling, and thermal burn or heat injury from ultrasound with the ultrasound-assisted lipoplasty technique. Any change in weight fluctuations can alter the outcome.   Electronically signed by: Carola Rhine Tandy Lewin, PA-C 05/01/2020 12:17 PM

## 2020-05-01 NOTE — Progress Notes (Addendum)
Patient ID: Leah [name], female    DOB: 05/02/1960, 60 y.o.   MRN: [id number]  Chief Complaint  Patient presents with  . Pre-op Exam      ICD-10-CM   1. History of prophylactic mastectomy of both breasts  Z90.13   2. 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  female  with a history of Bilateral breast reconstruction .  She presents for preoperative evaluation for upcoming procedure, revision of bilateral breast reconstruction, bilateral liposuction with excision of lateral breast and axillary tissue, scheduled for 05/07/2020 with Dr. Claudia Desanctis.  The patient has not had problems with anesthesia.  She reports nausea after anesthesia in the past No history of DVT/PE.  No family history of DVT/PE.  No family or personal history of bleeding or clotting disorders.  Patient is not currently taking any blood thinners.  No history of CVA/MI.   Summary of Previous Visit: Patient presents to discuss concerns regarding bilateral breast reconstruction.  She had bilateral mastectomies years ago followed by implant based reconstruction combined with bilateral latissimus flaps.  Her primary concern is development of significant lateral axillary soft tissue.  PMH Significant for: Anxiety, diabetes, carcinoid syndrome, sarcoidosis, hypertension, GE reflux, OSA on CPAP, history of metastatic malignant neuroendocrine tumor of liver-followed by oncology. Chronic pain of bilateral knees, severe obesity.  Most recent A1c 6.4.  She is currently on aspirin 81 mg.  She is planning to stop this 5 days prior to surgery. She reports overall she is doing well, no recent fevers, chills, chest pain, shortness of breath.   Past Medical History: Allergies: No Known Allergies  Current Medications:  Current Outpatient Medications:  .  amLODipine (NORVASC) 10  MG tablet, TAKE 1 TABLET BY MOUTH EVERY DAY, Disp: 90 tablet, Rfl: 1 .  aspirin 81 MG EC tablet, TAKE 1 TABLET BY MOUTH EVERY DAY *SWALLOW WHOLE*, Disp: 90 tablet, Rfl: 1 .  clonazePAM (KLONOPIN) 0.5 MG tablet, TAKE 1 TABLET BY MOUTH TWICE A DAY AS NEEDED FOR ANXIETY, Disp: 30 tablet, Rfl: 0 .  fexofenadine (ALLEGRA) 180 MG tablet, TAKE 1 TABLET BY MOUTH EVERY DAY, Disp: 90 tablet, Rfl: 3 .  gabapentin (NEURONTIN) 100 MG capsule, TAKE 1 CAPSULE (100 MG TOTAL) BY MOUTH AT BEDTIME., Disp: 90 capsule, Rfl: 0 .  HYDROcodone-acetaminophen (NORCO) 7.5-325 MG tablet, Take 1 tablet by mouth every 6 (six) hours as needed for up to 5 days for severe pain (Severe pain, post op pain only.)., Disp: 20 tablet, Rfl: 0 .  metFORMIN (GLUCOPHAGE) 500 MG tablet, Take 1 tablet (500 mg total) by mouth 2 (two) times daily with a meal., Disp: 180 tablet, Rfl: 3 .  metoprolol succinate (TOPROL-XL) 100 MG 24 hr tablet, TAKE 1 TABLET BY MOUTH EVERY EVENING WITH OR IMMEDIATELY FOLLOWING A MEAL, Disp: 90 tablet, Rfl: 1 .  montelukast (SINGULAIR) 10 MG tablet, TAKE 1 TABLET BY MOUTH EVERYDAY AT BEDTIME, Disp: 90 tablet, Rfl: 1 .  naproxen (NAPROSYN) 500 MG tablet, TAKE 1 TABLET TWICE A DAY WITH A MEAL, Disp: 180 tablet, Rfl: 0 .  octreotide (SANDOSTATIN LAR) 30 MG injection, Inject 30 mg into the muscle every 28 (twenty-eight) days., Disp: , Rfl:  .  ondansetron (ZOFRAN) 4 MG tablet, Take 1 tablet (4 mg total) by mouth every 8 (eight) hours as needed., Disp: 18 tablet, Rfl: 0 .  ondansetron (ZOFRAN) 4 MG tablet, Take 1 tablet (4 mg total) by mouth every 8 (eight) hours as needed for nausea or vomiting., Disp: 20 tablet, Rfl: 0 .  pantoprazole (PROTONIX) 40 MG tablet, TAKE 1 TABLET BY MOUTH TWICE A DAY, Disp: 180 tablet, Rfl: 0 .  PROAIR HFA 108 (90 Base) MCG/ACT inhaler, INHALE 2 PUFFS INTO THE LUNGS EVERY 6 (SIX) HOURS AS NEEDED. FOR SHORTNESS OF BREATH. (Patient taking differently: Inhale 2 puffs into the lungs every 6 (six)  hours as needed for wheezing or shortness of breath.), Disp: 6.7 Inhaler, Rfl: 4 .  rosuvastatin (CRESTOR) 10 MG tablet, Take 1 tablet (10 mg total) by mouth daily., Disp: 90 tablet, Rfl: 3 .  Semaglutide,0.25 or 0.5MG /DOS, (OZEMPIC, 0.25 OR 0.5 MG/DOSE,) 2 MG/1.5ML SOPN, Inject 0.5 mg into the skin once a week., Disp: 6 mL, Rfl: 0 .  sulfamethoxazole-trimethoprim (BACTRIM DS) 800-160 MG tablet, Take 1 tablet by mouth 2 (two) times daily for 7 days., Disp: 14 tablet, Rfl: 0 .  valACYclovir (VALTREX) 1000 MG tablet, Take by mouth., Disp: , Rfl:  .  valsartan (DIOVAN) 320 MG tablet, TAKE 1 TABLET BY MOUTH EVERY DAY, Disp: 90 tablet, Rfl: 1 .  venlafaxine XR (EFFEXOR-XR) 150 MG 24 hr capsule, TAKE 1 CAPSULE BY MOUTH EACH MORNING, Disp: 90 capsule, Rfl: 0  Past Medical Problems: Past Medical History:  Diagnosis Date  . Allergic rhinitis   . Anxiety   . Atrophic vaginitis   . Back pain   . Borderline diabetes 03/17/2013  . Carcinoid syndrome ([place])   . Constipation   . Diabetes ([place])   . Dyspnea   . Fatty liver   . Gallbladder problem   . GE reflux   . Hepatic encephalopathy ([place]) 2011  . History of PCR DNA positive for HSV1   . History of retinal vein occlusion, right eye   . HTN (hypertension) 2011  . Joint pain   . Liver problem   . Migraines   . Neuroendocrine tumor 2011  . Orthostatic hypotension   . Osteoarthritis   . Osteopenia   . Palpitations   . Sarcoidosis 2008  . Sleep apnea    uses cpap  . Surgical menopause     Past Surgical History: Past Surgical History:  Procedure Laterality Date  . ABDOMINAL HYSTERECTOMY  2004  . AUGMENTATION MAMMAPLASTY    . BREAST BIOPSY Left   . CESAREAN SECTION  [id number]  . CHOLECYSTECTOMY  2006  . HERNIA REPAIR    . KNEE ARTHROSCOPY Left 09/2010  . LAPAROSCOPIC HEPATECTOMY    . MASTECTOMY Bilateral 2008  . OOPHORECTOMY  2004  . RECONSTRUCTION BREAST W/ LATISSIMUS DORSI FLAP Bilateral   . TONSILLECTOMY  1976  . TUBAL LIGATION  Bilateral 2001    Social History: Social History   Socioeconomic History  . Marital status: Married    Spouse name: Vicente Serene   . Number of children: 2  . Years of education: Not on file  . Highest education level: Some college, no degree  Occupational History  . Occupation: [employer] at [employer] Use  . Smoking status: Never Smoker  . Smokeless tobacco: Never Used  . Tobacco comment: never used tobacco  Vaping Use  . Vaping Use: Never used  Substance and Sexual Activity  . Alcohol use: No    Alcohol/week: 0.0 standard drinks  . Drug use: No  . Sexual activity: Yes    Partners: Male    Birth control/protection: Surgical  Comment: hysterectomy  Other Topics Concern  . Not on file  Social History Narrative   Married to [place]. 2 sons (2001) Kasandra Knudsen and Cristie Hem (1999). Has dog. 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   Social Determinants of Health   Financial Resource Strain: Not on file  Food Insecurity: Not on file  Transportation Needs: Not on file  Physical Activity: Not on file  Stress: Not on file  Social Connections: Not on file  Intimate Partner Violence: Not on file    Family History: Family History  Problem Relation Age of Onset  . Arthritis Maternal Grandmother   . Arthritis Maternal Grandfather   . Arthritis Paternal Grandmother   . Arthritis Paternal Grandfather   . Heart failure Paternal Grandfather   . Breast cancer Mother 80  . Lupus Mother   . Heart failure Father   . Hypertension Father   . Hyperlipidemia Father   . Sudden death Father   . Obesity Father   . Breast cancer Maternal Aunt 38  . Ovarian cancer Maternal Aunt 60  . Breast cancer Maternal Aunt 40    Review of Systems: ROS  Physical Exam: Vital Signs BP (!) 146/90 (BP Location: Right Wrist, Patient Position: Sitting, Cuff Size: Small)   Pulse 84   Ht 4\' 11"  (1.499 m)   Wt 213 lb 9.6 oz (96.9 kg)   BMI  43.14 kg/m   Physical Exam Constitutional:      General: Not in acute distress.    Appearance: Normal appearance. Not ill-appearing.  HENT:     Head: Normocephalic and atraumatic.  Eyes:     Pupils: Pupils are equal, round Neck:     Musculoskeletal: Normal range of motion.  Cardiovascular:     Rate and Rhythm: Normal rate    Pulses: Normal pulses.  Pulmonary:     Effort: Pulmonary effort is normal. No respiratory distress.  Musculoskeletal: Normal range of motion.  Skin:    General: Skin is warm and dry.     Findings: No erythema or rash.  Neurological:     General: No focal deficit present.     Mental Status: Alert and oriented to person, place, and time. Mental status is at baseline.     Motor: No weakness.  Psychiatric:        Mood and Affect: Mood normal.        Behavior: Behavior normal.    Assessment/Plan: The patient is scheduled for revision of bilateral breast reconstruction, bilateral liposuction with excision of lateral breasts and axillary tissue with Dr. Claudia Desanctis.  Risks, benefits, and alternatives of procedure discussed, questions answered and consent obtained.    Smoking Status: Non-smoker; Counseling Given?  N/A Last Mammogram: 05/27/2019; Results: No evidence of malignancy  Caprini Score: 8, high; Risk Factors include: Age, BMI greater than 40, history of cancer, and length of planned surgery. Recommendation for mechanical and pharmacological prophylaxis for 7 to 10 days postoperatively. Encourage early ambulation.  Will discuss postoperative anticoagulation with Dr. Claudia Desanctis Addendum: We will prescribe 7 days postop Lovenox 40 mg daily  Pictures obtained: 04/05/2020  Post-op Rx sent to pharmacy: Norco, Zofran, Bactrim  Patient was provided with the General Surgical Risk consent document and Pain Medication Agreement prior to their appointment.  They had adequate time to read through the risk consent documents and Pain Medication Agreement. We also discussed them in  person together during this preop appointment. All of their questions were  answered to their satisfaction.  Recommended calling if they have any further questions.  Risk consent form and Pain Medication Agreement to be scanned into patient's chart.  The risks that can be encountered with and after liposuction were discussed and include the following but no limited to these:  Asymmetry, fluid accumulation, firmness of the area, fat necrosis with death of fat tissue, bleeding, infection, delayed healing, anesthesia risks, skin sensation changes, injury to structures including nerves, blood vessels, and muscles which may be temporary or permanent, allergies to tape, suture materials and glues, blood products, topical preparations or injected agents, skin and contour irregularities, skin discoloration and swelling, deep vein thrombosis, cardiac and pulmonary complications, pain, which may persist, persistent pain, recurrence of the lesion, poor healing of the incision, possible need for revisional surgery or staged procedures. Thiere can also be persistent swelling, poor wound healing, rippling or loose skin, worsening of cellulite, swelling, and thermal burn or heat injury from ultrasound with the ultrasound-assisted lipoplasty technique. Any change in weight fluctuations can alter the outcome.   Electronically signed by: Carola Rhine Dom [place]ver[place], PA-C 05/01/2020 12:17 PM

## 2020-05-01 NOTE — Addendum Note (Signed)
Addended byRoetta Sessions on: 05/01/2020 01:44 PM   Modules accepted: Orders

## 2020-05-03 ENCOUNTER — Ambulatory Visit: Payer: 59 | Admitting: Family Medicine

## 2020-05-03 ENCOUNTER — Encounter (HOSPITAL_BASED_OUTPATIENT_CLINIC_OR_DEPARTMENT_OTHER)
Admission: RE | Admit: 2020-05-03 | Discharge: 2020-05-03 | Disposition: A | Discharge status: Home / Self Care | Payer: 59 | Source: Ambulatory Visit | Attending: Plastic Surgery | Admitting: Plastic Surgery

## 2020-05-03 DIAGNOSIS — Z01812 Encounter for preprocedural laboratory examination: Secondary | ICD-10-CM | POA: Insufficient documentation

## 2020-05-03 LAB — COMPREHENSIVE METABOLIC PANEL
ALT: 45 U/L — ABNORMAL HIGH (ref 0–44)
AST: 50 U/L — ABNORMAL HIGH (ref 15–41)
Albumin: 3.5 g/dL (ref 3.5–5.0)
Alkaline Phosphatase: 82 U/L (ref 38–126)
Anion gap: 10 (ref 5–15)
BUN: 13 mg/dL (ref 8–23)
CO2: 24 mmol/L (ref 22–32)
Calcium: 8.7 mg/dL — ABNORMAL LOW (ref 8.9–10.3)
Chloride: 101 mmol/L (ref 98–111)
Creatinine, Ser: 0.94 mg/dL (ref 0.44–1.00)
GFR, Estimated: >60 mL/min (ref >60)
Glucose, Bld: 199 mg/dL — ABNORMAL HIGH (ref 70–99)
Potassium: 3.8 mmol/L (ref 3.5–5.1)
Sodium: 135 mmol/L (ref 135–145)
Total Bilirubin: 0.5 mg/dL (ref 0.3–1.2)
Total Protein: 6.7 g/dL (ref 6.5–8.1)

## 2020-05-03 NOTE — Telephone Encounter (Signed)
Did you call on this since it was sent in by 2 different people?  aw

## 2020-05-03 NOTE — Telephone Encounter (Signed)
I spoke with the patient's [facility] to clarify rx refill for medication below.  Pt verified that she takes the dissolved tablet. Pt just received a refill for this on 05/01/20. I cancelled refill from PCP office.

## 2020-05-04 ENCOUNTER — Other Ambulatory Visit (HOSPITAL_COMMUNITY)
Admission: RE | Admit: 2020-05-04 | Discharge: 2020-05-04 | Disposition: A | Discharge status: Home / Self Care | Payer: 59 | Source: Ambulatory Visit | Attending: Plastic Surgery | Admitting: Plastic Surgery

## 2020-05-04 DIAGNOSIS — Z01812 Encounter for preprocedural laboratory examination: Secondary | ICD-10-CM | POA: Insufficient documentation

## 2020-05-04 DIAGNOSIS — Z20822 Contact with and (suspected) exposure to covid-19: Secondary | ICD-10-CM | POA: Insufficient documentation

## 2020-05-04 LAB — SARS CORONAVIRUS 2 (TAT 6-24 HRS): SARS Coronavirus 2: NEGATIVE

## 2020-05-04 NOTE — Progress Notes (Signed)
Sent text reminding pt to go for covid testing.

## 2020-05-07 ENCOUNTER — Encounter (HOSPITAL_BASED_OUTPATIENT_CLINIC_OR_DEPARTMENT_OTHER): Payer: Self-pay | Admitting: Plastic Surgery

## 2020-05-07 ENCOUNTER — Ambulatory Visit (HOSPITAL_BASED_OUTPATIENT_CLINIC_OR_DEPARTMENT_OTHER): Payer: 59 | Admitting: Anesthesiology

## 2020-05-07 ENCOUNTER — Ambulatory Visit (INDEPENDENT_AMBULATORY_CARE_PROVIDER_SITE_OTHER): Payer: 59 | Admitting: Family Medicine

## 2020-05-07 ENCOUNTER — Ambulatory Visit (HOSPITAL_BASED_OUTPATIENT_CLINIC_OR_DEPARTMENT_OTHER)
Admission: RE | Admit: 2020-05-07 | Discharge: 2020-05-07 | Disposition: A | Discharge status: Home / Self Care | Payer: 59 | Attending: Plastic Surgery | Admitting: Plastic Surgery

## 2020-05-07 ENCOUNTER — Other Ambulatory Visit: Payer: Self-pay

## 2020-05-07 ENCOUNTER — Encounter (HOSPITAL_BASED_OUTPATIENT_CLINIC_OR_DEPARTMENT_OTHER)
Admission: RE | Disposition: A | Discharge status: Home / Self Care | Payer: Self-pay | Source: Home / Self Care | Attending: Plastic Surgery

## 2020-05-07 DIAGNOSIS — G4733 Obstructive sleep apnea (adult) (pediatric): Secondary | ICD-10-CM | POA: Diagnosis not present

## 2020-05-07 DIAGNOSIS — Z8041 Family history of malignant neoplasm of ovary: Secondary | ICD-10-CM | POA: Diagnosis not present

## 2020-05-07 DIAGNOSIS — Z7982 Long term (current) use of aspirin: Secondary | ICD-10-CM | POA: Insufficient documentation

## 2020-05-07 DIAGNOSIS — Z8249 Family history of ischemic heart disease and other diseases of the circulatory system: Secondary | ICD-10-CM | POA: Insufficient documentation

## 2020-05-07 DIAGNOSIS — N650 Deformity of reconstructed breast: Secondary | ICD-10-CM

## 2020-05-07 DIAGNOSIS — Z6841 Body Mass Index (BMI) 40.0 and over, adult: Secondary | ICD-10-CM | POA: Insufficient documentation

## 2020-05-07 DIAGNOSIS — D869 Sarcoidosis, unspecified: Secondary | ICD-10-CM | POA: Insufficient documentation

## 2020-05-07 DIAGNOSIS — Z79899 Other long term (current) drug therapy: Secondary | ICD-10-CM | POA: Diagnosis not present

## 2020-05-07 DIAGNOSIS — I10 Essential (primary) hypertension: Secondary | ICD-10-CM | POA: Diagnosis not present

## 2020-05-07 DIAGNOSIS — E119 Type 2 diabetes mellitus without complications: Secondary | ICD-10-CM | POA: Insufficient documentation

## 2020-05-07 DIAGNOSIS — Z803 Family history of malignant neoplasm of breast: Secondary | ICD-10-CM | POA: Insufficient documentation

## 2020-05-07 DIAGNOSIS — Z853 Personal history of malignant neoplasm of breast: Secondary | ICD-10-CM

## 2020-05-07 DIAGNOSIS — Z8261 Family history of arthritis: Secondary | ICD-10-CM | POA: Insufficient documentation

## 2020-05-07 DIAGNOSIS — Z9013 Acquired absence of bilateral breasts and nipples: Secondary | ICD-10-CM | POA: Diagnosis not present

## 2020-05-07 DIAGNOSIS — K219 Gastro-esophageal reflux disease without esophagitis: Secondary | ICD-10-CM | POA: Insufficient documentation

## 2020-05-07 DIAGNOSIS — N651 Disproportion of reconstructed breast: Secondary | ICD-10-CM | POA: Diagnosis present

## 2020-05-07 DIAGNOSIS — Z7984 Long term (current) use of oral hypoglycemic drugs: Secondary | ICD-10-CM | POA: Insufficient documentation

## 2020-05-07 HISTORY — PX: BREAST RECONSTRUCTION: SHX9

## 2020-05-07 HISTORY — DX: Anxiety disorder, unspecified: F41.9

## 2020-05-07 HISTORY — PX: LIPOSUCTION: SHX10

## 2020-05-07 LAB — GLUCOSE, CAPILLARY
Glucose-Capillary: 160 mg/dL — ABNORMAL HIGH (ref 70–99)
Glucose-Capillary: 196 mg/dL — ABNORMAL HIGH (ref 70–99)

## 2020-05-07 SURGERY — RECONSTRUCTION, BREAST
Anesthesia: General | Site: Breast | Laterality: Bilateral

## 2020-05-07 MED ORDER — FENTANYL CITRATE (PF) 100 MCG/2ML IJ SOLN
INTRAMUSCULAR | Status: DC | PRN
  Administered 2020-05-07: 50 ug via INTRAVENOUS
  Administered 2020-05-07: 100 ug via INTRAVENOUS

## 2020-05-07 MED ORDER — MIDAZOLAM HCL 2 MG/2ML IJ SOLN
INTRAMUSCULAR | Status: AC
  Filled 2020-05-07: qty 2

## 2020-05-07 MED ORDER — DEXAMETHASONE SODIUM PHOSPHATE 10 MG/ML IJ SOLN
INTRAMUSCULAR | Status: AC
  Filled 2020-05-07: qty 1

## 2020-05-07 MED ORDER — PHENYLEPHRINE HCL (PRESSORS) 10 MG/ML IV SOLN
INTRAVENOUS | Status: DC | PRN
  Administered 2020-05-07: 80 ug via INTRAVENOUS
  Administered 2020-05-07: 160 ug via INTRAVENOUS
  Administered 2020-05-07 (×2): 80 ug via INTRAVENOUS

## 2020-05-07 MED ORDER — FENTANYL CITRATE (PF) 100 MCG/2ML IJ SOLN
INTRAMUSCULAR | Status: AC
  Filled 2020-05-07: qty 2

## 2020-05-07 MED ORDER — BUPIVACAINE HCL (PF) 0.25 % IJ SOLN
INTRAMUSCULAR | Status: AC
  Filled 2020-05-07: qty 60

## 2020-05-07 MED ORDER — ROCURONIUM BROMIDE 100 MG/10ML IV SOLN
INTRAVENOUS | Status: DC | PRN
  Administered 2020-05-07: 60 mg via INTRAVENOUS

## 2020-05-07 MED ORDER — LIDOCAINE-EPINEPHRINE 1 %-1:100000 IJ SOLN
INTRAMUSCULAR | Status: AC
  Filled 2020-05-07: qty 2

## 2020-05-07 MED ORDER — SUGAMMADEX SODIUM 500 MG/5ML IV SOLN
INTRAVENOUS | Status: DC | PRN
  Administered 2020-05-07: 400 mg via INTRAVENOUS

## 2020-05-07 MED ORDER — ONDANSETRON HCL 4 MG/2ML IJ SOLN
INTRAMUSCULAR | Status: AC
  Filled 2020-05-07: qty 2

## 2020-05-07 MED ORDER — ONDANSETRON HCL 4 MG/2ML IJ SOLN
INTRAMUSCULAR | Status: DC | PRN
  Administered 2020-05-07: 4 mg via INTRAVENOUS

## 2020-05-07 MED ORDER — MIDAZOLAM HCL 5 MG/5ML IJ SOLN
INTRAMUSCULAR | Status: DC | PRN
  Administered 2020-05-07: 2 mg via INTRAVENOUS

## 2020-05-07 MED ORDER — DEXAMETHASONE SODIUM PHOSPHATE 4 MG/ML IJ SOLN
INTRAMUSCULAR | Status: DC | PRN
  Administered 2020-05-07: 5 mg via INTRAVENOUS

## 2020-05-07 MED ORDER — LIDOCAINE 2% (20 MG/ML) 5 ML SYRINGE
INTRAMUSCULAR | Status: AC
  Filled 2020-05-07: qty 5

## 2020-05-07 MED ORDER — CEFAZOLIN SODIUM-DEXTROSE 2-4 GM/100ML-% IV SOLN
2.0000 g | INTRAVENOUS | Status: AC
  Administered 2020-05-07: 2 g via INTRAVENOUS

## 2020-05-07 MED ORDER — LIDOCAINE HCL (PF) 1 % IJ SOLN
INTRAMUSCULAR | Status: AC
  Filled 2020-05-07: qty 120

## 2020-05-07 MED ORDER — EPHEDRINE SULFATE 50 MG/ML IJ SOLN
INTRAMUSCULAR | Status: DC | PRN
  Administered 2020-05-07 (×3): 10 mg via INTRAVENOUS

## 2020-05-07 MED ORDER — CEFAZOLIN SODIUM-DEXTROSE 2-4 GM/100ML-% IV SOLN
INTRAVENOUS | Status: AC
  Filled 2020-05-07: qty 100

## 2020-05-07 MED ORDER — LACTATED RINGERS IV SOLN
INTRAVENOUS | Status: DC | PRN
  Administered 2020-05-07: 900 mL
  Administered 2020-05-07: 800 mL

## 2020-05-07 MED ORDER — OXYCODONE HCL 5 MG/5ML PO SOLN: 5.0000 mg | Freq: Once | ORAL | Status: AC | PRN

## 2020-05-07 MED ORDER — ONDANSETRON HCL 4 MG/2ML IJ SOLN
4.0000 mg | Freq: Four times a day (QID) | INTRAMUSCULAR | Status: AC | PRN
Start: 2020-05-07 — End: 2020-05-07
  Administered 2020-05-07: 4 mg via INTRAVENOUS

## 2020-05-07 MED ORDER — PHENYLEPHRINE HCL-NACL 10-0.9 MG/250ML-% IV SOLN
INTRAVENOUS | Status: DC | PRN
Start: 2020-05-07 — End: 2020-05-07
  Administered 2020-05-07: 30 ug/min via INTRAVENOUS

## 2020-05-07 MED ORDER — PROPOFOL 10 MG/ML IV BOLUS
INTRAVENOUS | Status: AC
  Filled 2020-05-07: qty 40

## 2020-05-07 MED ORDER — LIDOCAINE HCL (CARDIAC) PF 100 MG/5ML IV SOSY
PREFILLED_SYRINGE | INTRAVENOUS | Status: DC | PRN
  Administered 2020-05-07: 50 mg via INTRAVENOUS

## 2020-05-07 MED ORDER — LACTATED RINGERS IV SOLN: INTRAVENOUS | Status: DC

## 2020-05-07 MED ORDER — FENTANYL CITRATE (PF) 100 MCG/2ML IJ SOLN
25.0000 ug | INTRAMUSCULAR | Status: DC | PRN
Start: 2020-05-07 — End: 2020-05-07

## 2020-05-07 MED ORDER — OXYCODONE HCL 5 MG PO TABS
ORAL_TABLET | ORAL | Status: AC
  Filled 2020-05-07: qty 1

## 2020-05-07 MED ORDER — PROPOFOL 10 MG/ML IV BOLUS
INTRAVENOUS | Status: DC | PRN
  Administered 2020-05-07: 130 mg via INTRAVENOUS

## 2020-05-07 MED ORDER — SUGAMMADEX SODIUM 500 MG/5ML IV SOLN
INTRAVENOUS | Status: AC
  Filled 2020-05-07: qty 5

## 2020-05-07 MED ORDER — OXYCODONE HCL 5 MG PO TABS
5.0000 mg | ORAL_TABLET | Freq: Once | ORAL | Status: AC | PRN
  Administered 2020-05-07: 5 mg via ORAL

## 2020-05-07 MED ORDER — EPINEPHRINE PF 1 MG/ML IJ SOLN
INTRAMUSCULAR | Status: AC
  Filled 2020-05-07: qty 3

## 2020-05-07 SURGICAL SUPPLY — 99 items
ADH SKN CLS APL DERMABOND .7 (GAUZE/BANDAGES/DRESSINGS)
APL PRP STRL LF DISP 70% ISPRP (MISCELLANEOUS) ×2
APL SKNCLS STERI-STRIP NONHPOA (GAUZE/BANDAGES/DRESSINGS) ×2
BAG DECANTER FOR FLEXI CONT (MISCELLANEOUS) IMPLANT
BENZOIN TINCTURE PRP APPL 2/3 (GAUZE/BANDAGES/DRESSINGS) ×4 IMPLANT
BINDER ABDOMINAL  9 SM 30-45 (SOFTGOODS)
BINDER ABDOMINAL 10 UNV 27-48 (MISCELLANEOUS) IMPLANT
BINDER ABDOMINAL 12 SM 30-45 (SOFTGOODS) IMPLANT
BINDER ABDOMINAL 9 SM 30-45 (SOFTGOODS) IMPLANT
BINDER BREAST LRG (GAUZE/BANDAGES/DRESSINGS) IMPLANT
BINDER BREAST MEDIUM (GAUZE/BANDAGES/DRESSINGS) IMPLANT
BINDER BREAST XLRG (GAUZE/BANDAGES/DRESSINGS) IMPLANT
BINDER BREAST XXLRG (GAUZE/BANDAGES/DRESSINGS) IMPLANT
BLADE HEX COATED 2.75 (ELECTRODE) ×2 IMPLANT
BLADE SURG 10 STRL SS (BLADE) ×4 IMPLANT
BLADE SURG 15 STRL LF DISP TIS (BLADE) ×1 IMPLANT
BLADE SURG 15 STRL SS (BLADE) ×2
BNDG ELASTIC 4X5.8 VLCR STR LF (GAUZE/BANDAGES/DRESSINGS) IMPLANT
BNDG ELASTIC 6X5.8 VLCR STR LF (GAUZE/BANDAGES/DRESSINGS) ×4 IMPLANT
BNDG GAUZE ELAST 4 BULKY (GAUZE/BANDAGES/DRESSINGS) IMPLANT
CANISTER SUCT 1200ML W/VALVE (MISCELLANEOUS) ×2 IMPLANT
CHLORAPREP W/TINT 26 (MISCELLANEOUS) ×4 IMPLANT
CLIP VESOCCLUDE MED 6/CT (CLIP) IMPLANT
COVER BACK TABLE 60X90IN (DRAPES) ×2 IMPLANT
COVER MAYO STAND STRL (DRAPES) ×2 IMPLANT
COVER WAND RF STERILE (DRAPES) IMPLANT
DECANTER SPIKE VIAL GLASS SM (MISCELLANEOUS) IMPLANT
DERMABOND ADVANCED (GAUZE/BANDAGES/DRESSINGS)
DERMABOND ADVANCED .7 DNX12 (GAUZE/BANDAGES/DRESSINGS) IMPLANT
DRAIN CHANNEL 15F RND FF W/TCR (WOUND CARE) IMPLANT
DRAIN PENROSE 1/2X12 LTX STRL (WOUND CARE) IMPLANT
DRAPE LAPAROSCOPIC ABDOMINAL (DRAPES) ×4 IMPLANT
DRAPE UTILITY XL STRL (DRAPES) ×4 IMPLANT
DRSG ADAPTIC 3X8 NADH LF (GAUZE/BANDAGES/DRESSINGS) IMPLANT
DRSG EMULSION OIL 3X3 NADH (GAUZE/BANDAGES/DRESSINGS) IMPLANT
DRSG PAD ABDOMINAL 8X10 ST (GAUZE/BANDAGES/DRESSINGS) ×8 IMPLANT
ELECT BLADE 4.0 EZ CLEAN MEGAD (MISCELLANEOUS)
ELECT REM PT RETURN 9FT ADLT (ELECTROSURGICAL) ×2
ELECTRODE BLDE 4.0 EZ CLN MEGD (MISCELLANEOUS) IMPLANT
ELECTRODE REM PT RTRN 9FT ADLT (ELECTROSURGICAL) ×1 IMPLANT
EVACUATOR SILICONE 100CC (DRAIN) IMPLANT
GAUZE SPONGE 4X4 12PLY STRL (GAUZE/BANDAGES/DRESSINGS) ×4 IMPLANT
GAUZE XEROFORM 5X9 LF (GAUZE/BANDAGES/DRESSINGS) IMPLANT
GLOVE SRG 8 PF TXTR STRL LF DI (GLOVE) IMPLANT
GLOVE SURG ENC MOIS LTX SZ6.5 (GLOVE) ×4 IMPLANT
GLOVE SURG ENC MOIS LTX SZ7.5 (GLOVE) IMPLANT
GLOVE SURG ENC TEXT LTX SZ7.5 (GLOVE) ×4 IMPLANT
GLOVE SURG LTX SZ6.5 (GLOVE) ×2 IMPLANT
GLOVE SURG POLYISO LF SZ6.5 (GLOVE) ×2 IMPLANT
GLOVE SURG UNDER POLY LF SZ6.5 (GLOVE) ×2 IMPLANT
GLOVE SURG UNDER POLY LF SZ7 (GLOVE) ×4 IMPLANT
GLOVE SURG UNDER POLY LF SZ8 (GLOVE)
GOWN STRL REUS W/ TWL LRG LVL3 (GOWN DISPOSABLE) ×4 IMPLANT
GOWN STRL REUS W/TWL LRG LVL3 (GOWN DISPOSABLE) ×8
LINER CANISTER 1000CC FLEX (MISCELLANEOUS) ×4 IMPLANT
MARKER SKIN DUAL TIP RULER LAB (MISCELLANEOUS) IMPLANT
NDL SAFETY ECLIPSE 18X1.5 (NEEDLE) ×1 IMPLANT
NEEDLE FILTER BLUNT 18X 1/2SAF (NEEDLE) ×1
NEEDLE FILTER BLUNT 18X1 1/2 (NEEDLE) ×1 IMPLANT
NEEDLE HYPO 18GX1.5 SHARP (NEEDLE) ×2
NEEDLE HYPO 25X1 1.5 SAFETY (NEEDLE) IMPLANT
NEEDLE SPNL 18GX3.5 QUINCKE PK (NEEDLE) ×2 IMPLANT
NS IRRIG 1000ML POUR BTL (IV SOLUTION) ×2 IMPLANT
PACK BASIN DAY SURGERY FS (CUSTOM PROCEDURE TRAY) ×2 IMPLANT
PAD ALCOHOL SWAB (MISCELLANEOUS) ×2 IMPLANT
PENCIL SMOKE EVACUATOR (MISCELLANEOUS) ×2 IMPLANT
PIN SAFETY STERILE (MISCELLANEOUS) IMPLANT
SHEET MEDIUM DRAPE 40X70 STRL (DRAPES) IMPLANT
SLEEVE SCD COMPRESS KNEE MED (STOCKING) ×2 IMPLANT
SPONGE LAP 18X18 RF (DISPOSABLE) ×4 IMPLANT
STAPLER INSORB 30 2030 C-SECTI (MISCELLANEOUS) ×4 IMPLANT
STAPLER VISISTAT 35W (STAPLE) ×2 IMPLANT
STRIP SUTURE WOUND CLOSURE 1/2 (MISCELLANEOUS) ×6 IMPLANT
SUT CHROMIC 4 0 PS 2 18 (SUTURE) IMPLANT
SUT ETHILON 2 0 FS 18 (SUTURE) IMPLANT
SUT ETHILON 3 0 PS 1 (SUTURE) IMPLANT
SUT MNCRL AB 3-0 PS2 18 (SUTURE) IMPLANT
SUT MNCRL AB 4-0 PS2 18 (SUTURE) ×4 IMPLANT
SUT MON AB 5-0 PS2 18 (SUTURE) ×4 IMPLANT
SUT PDS 3-0 CT2 (SUTURE)
SUT PDS II 3-0 CT2 27 ABS (SUTURE) IMPLANT
SUT PROLENE 4 0 PS 2 18 (SUTURE) IMPLANT
SUT VIC AB 3-0 PS1 18 (SUTURE)
SUT VIC AB 3-0 PS1 18XBRD (SUTURE) IMPLANT
SUT VIC AB 3-0 SH 27 (SUTURE)
SUT VIC AB 3-0 SH 27X BRD (SUTURE) IMPLANT
SUT VICRYL 4-0 PS2 18IN ABS (SUTURE) IMPLANT
SUT VLOC 180 P-14 24 (SUTURE) ×8 IMPLANT
SYR 50ML LL SCALE MARK (SYRINGE) ×2 IMPLANT
SYR BULB IRRIG 60ML STRL (SYRINGE) ×2 IMPLANT
SYR CONTROL 10ML LL (SYRINGE) IMPLANT
SYR TB 1ML LL NO SAFETY (SYRINGE) IMPLANT
TAPE MEASURE VINYL STERILE (MISCELLANEOUS) IMPLANT
TOWEL GREEN STERILE FF (TOWEL DISPOSABLE) ×4 IMPLANT
TUBE CONNECTING 20X1/4 (TUBING) ×2 IMPLANT
TUBING INFILTRATION IT-10001 (TUBING) ×2 IMPLANT
TUBING SET GRADUATE ASPIR 12FT (MISCELLANEOUS) ×2 IMPLANT
UNDERPAD 30X36 HEAVY ABSORB (UNDERPADS AND DIAPERS) ×4 IMPLANT
YANKAUER SUCT BULB TIP NO VENT (SUCTIONS) ×2 IMPLANT

## 2020-05-07 NOTE — Transfer of Care (Signed)
Immediate Anesthesia Transfer of Care Note  Patient: Leah [name]  Procedure(s) Performed: Revision of bilateral breast reconstruction (Bilateral Breast) LIPOSUCTION with excision of lateral breast and axillary tissue (Bilateral Breast)  Patient Location: PACU  Anesthesia Type:General  Level of Consciousness: awake  Airway & Oxygen Therapy: Patient Spontanous Breathing and Patient connected to face mask oxygen  Post-op Assessment: Report given to RN and Post -op Vital signs reviewed and stable  Post vital signs: Reviewed and stable  Last Vitals:  Vitals Value Taken Time  BP    Temp    Pulse 81 05/07/20 1120  Resp 18 05/07/20 1120  SpO2 99 % 05/07/20 1120  Vitals shown include unvalidated device data.  Last Pain:  Vitals:   05/07/20 0845  TempSrc: Oral  PainSc: 0-No pain      Patients Stated Pain Goal: 4 (09/62/83 6629)  Complications: No complications documented.

## 2020-05-07 NOTE — Op Note (Signed)
Operative Note   DATE OF OPERATION: 05/07/2020  SURGICAL DEPARTMENT: Plastic Surgery  PREOPERATIVE DIAGNOSES: Contour deformity after bilateral breast reconstruction for cancer  POSTOPERATIVE DIAGNOSES:  same  PROCEDURE:  1.  Revision of bilateral breast reconstruction with excision of excess lateral breast, axillary and back soft tissue  SURGEON: Talmadge Coventry, MD  ASSISTANT: [place], RNFA The advanced practice practitioner (APP) assisted throughout the case.  The APP was essential in retraction and counter traction when needed to make the case progress smoothly.  This retraction and assistance made it possible to see the tissue plans for the procedure.  The assistance was needed for blood control, tissue re-approximation and assisted with closure of the incision site.  ANESTHESIA:  General.   COMPLICATIONS: None.   INDICATIONS FOR PROCEDURE:  The patient, Leah [name] is a 60 y.o. female born on 14-Oct-1960, is here for treatment of contour irregularity after bilateral breast reconstruction with bilateral latissimus flaps.  She developed significant excess lateral breast and axillary soft tissue in the year since her reconstruction and requires revision for appropriate soft tissue distribution around her breast. MRN: [id number]  CONSENT:  Informed consent was obtained directly from the patient. Risks, benefits and alternatives were fully discussed. Specific risks including but not limited to bleeding, infection, hematoma, seroma, scarring, pain, contracture, asymmetry, wound healing problems, and need for further surgery were all discussed. The patient did have an ample opportunity to have questions answered to satisfaction.   DESCRIPTION OF PROCEDURE:  The patient was taken to the operating room. SCDs were placed and antibiotics were given.  General anesthesia was administered.  The patient's operative site was prepped and draped in a sterile fashion. A time out was performed  and all information was confirmed to be correct.  Patient started and right side down lateral decubitus position.  I had marked her preoperatively for the excision that went from her lateral breast around to her mid back in the area of her latissimus flap scar.  This area was infiltrated with 7 to 800 cc of tumescent solution.  Power assisted liposuction was then performed with about 800 cc of total Lipo aspirate suctioned.  At this point the skin was tailor tacked to ensure appropriate excision was planned and then the excision was performed with a 10 blade and the avulsion technique.  Closure was then done after circumferential undermining of the soft tissues.  This was performed with buried Enzor staples and a running 3 oh V-Loc.  The total length of the excision was approximately 35 cm with a width of 10 to 12 cm.  Patient was then flipped to left side down lateral decubitus position and reprepped and redraped sterilely.  A similar procedure was then performed with infiltration of 800 cc of tumescent solution.  Liposuction was performed with approximately 800 cc of total Lipo aspirate.  The skin was tailor tacked to ensure appropriate excision was planned and then the skin was excised with a 10 blade and avulsion technique was performed.  Circumferential undermining was then performed and the skin was advanced and closed in layers with interrupted buried Enzor staples and a running 3 oh V-Loc.  Both wounds were dressed with benzoin and Steri-Strips and a soft compressive dressing.  The size and length of the excision on the right side were similar to the left and were about 35 cm x 10 to 12 cm in dimensions.  The patient tolerated the procedure well.  There were no complications. The patient was allowed  to wake from anesthesia, extubated and taken to the recovery room in satisfactory condition.

## 2020-05-07 NOTE — Anesthesia Procedure Notes (Signed)
Procedure Name: Intubation Date/Time: 05/07/2020 9:25 AM Performed by: Ezequiel Kayser, CRNA Pre-anesthesia Checklist: Patient identified, Emergency Drugs available, Suction available and Patient being monitored Patient Re-evaluated:Patient Re-evaluated prior to induction Oxygen Delivery Method: Circle System Utilized Preoxygenation: Pre-oxygenation with 100% oxygen Induction Type: IV induction Ventilation: Mask ventilation without difficulty Laryngoscope Size: Glidescope and 3 Grade View: Grade I Tube type: Oral Tube size: 7.0 mm Number of attempts: 1 Airway Equipment and Method: Stylet and Oral airway Placement Confirmation: ETT inserted through vocal cords under direct vision,  positive ETCO2 and breath sounds checked- equal and bilateral Secured at: 21 cm Tube secured with: Tape Dental Injury: Teeth and Oropharynx as per pre-operative assessment

## 2020-05-07 NOTE — Anesthesia Postprocedure Evaluation (Signed)
Anesthesia Post Note  Patient: Leah [name]  Procedure(s) Performed: Revision of bilateral breast reconstruction (Bilateral Breast) LIPOSUCTION with excision of lateral breast and axillary tissue (Bilateral Breast)     Patient location during evaluation: PACU Anesthesia Type: General Level of consciousness: awake and alert Pain management: pain level controlled Vital Signs Assessment: post-procedure vital signs reviewed and stable Respiratory status: spontaneous breathing, nonlabored ventilation, respiratory function stable and patient connected to nasal cannula oxygen Cardiovascular status: blood pressure returned to baseline and stable Postop Assessment: no apparent nausea or vomiting Anesthetic complications: no   No complications documented.  Last Vitals:  Vitals:   05/07/20 1200 05/07/20 1253  BP: 103/75 113/79  Pulse: 77 83  Resp: 20 16  Temp:  36.6 C  SpO2: 100% 99%    Last Pain:  Vitals:   05/07/20 1253  TempSrc:   PainSc: 4                  Jamille Fisher S

## 2020-05-07 NOTE — Anesthesia Preprocedure Evaluation (Signed)
Anesthesia Evaluation  Patient identified by MRN, date of birth, ID band Patient awake    Reviewed: Allergy & Precautions, H&P , NPO status , Patient's Chart, lab work & pertinent test results  Airway Mallampati: II   Neck ROM: full    Dental   Pulmonary shortness of breath, sleep apnea ,    breath sounds clear to auscultation       Cardiovascular hypertension,  Rhythm:regular Rate:Normal     Neuro/Psych  Headaches, Anxiety    GI/Hepatic GERD  ,  Endo/Other  diabetes, Type 2Morbid obesity  Renal/GU      Musculoskeletal  (+) Arthritis ,   Abdominal   Peds  Hematology   Anesthesia Other Findings   Reproductive/Obstetrics                             Anesthesia Physical Anesthesia Plan  ASA: III  Anesthesia Plan: General   Post-op Pain Management:    Induction: Intravenous  PONV Risk Score and Plan: 3 and Ondansetron, Dexamethasone, Midazolam and Treatment may vary due to age or medical condition  Airway Management Planned: Oral ETT  Additional Equipment:   Intra-op Plan:   Post-operative Plan: Extubation in OR  Informed Consent: I have reviewed the patients History and Physical, chart, labs and discussed the procedure including the risks, benefits and alternatives for the proposed anesthesia with the patient or authorized representative who has indicated his/her understanding and acceptance.     Dental advisory given  Plan Discussed with: CRNA, Anesthesiologist and Surgeon  Anesthesia Plan Comments:         Anesthesia Quick Evaluation

## 2020-05-07 NOTE — Discharge Instructions (Signed)
Activity As tolerated: NO showers for 3 days No heavy activities  Diet: Regular  Wound Care: Keep dressing clean & dry for 3 days.  Keep wrap applied with compression as much as possible.    Do not change dressings for 3 days unless soiled.  Can change if needed but make sure to reapply wrap. After three days can remove wrap and shower.  Then reapply dressings if needed and continue compression with wrap or soft sports bra. Call doctor if any unusual problems occur such as pain, excessive bleeding, unrelieved nausea/vomiting, fever &/or chills  Follow-up appointment: Scheduled for next week.   Post Anesthesia Home Care Instructions  Activity: Get plenty of rest for the remainder of the day. A responsible individual must stay with you for 24 hours following the procedure.  For the next 24 hours, DO NOT: -Drive a car -Operate machinery -Drink alcoholic beverages -Take any medication unless instructed by your physician -Make any legal decisions or sign important papers.  Meals: Start with liquid foods such as gelatin or soup. Progress to regular foods as tolerated. Avoid greasy, spicy, heavy foods. If nausea and/or vomiting occur, drink only clear liquids until the nausea and/or vomiting subsides. Call your physician if vomiting continues.  Special Instructions/Symptoms: Your throat may feel dry or sore from the anesthesia or the breathing tube placed in your throat during surgery. If this causes discomfort, gargle with warm salt water. The discomfort should disappear within 24 hours.  If you had a scopolamine patch placed behind your ear for the management of post- operative nausea and/or vomiting:  1. The medication in the patch is effective for 72 hours, after which it should be removed.  Wrap patch in a tissue and discard in the trash. Wash hands thoroughly with soap and water. 2. You may remove the patch earlier than 72 hours if you experience unpleasant side effects which may  include dry mouth, dizziness or visual disturbances. 3. Avoid touching the patch. Wash your hands with soap and water after contact with the patch.     

## 2020-05-07 NOTE — Interval H&P Note (Signed)
History and Physical Interval Note:  05/07/2020 8:37 AM  Leah [name]  has presented today for surgery, with the diagnosis of history of bilateral mastectomies for breast cancer.  The various methods of treatment have been discussed with the patient and family. After consideration of risks, benefits and other options for treatment, the patient has consented to  Procedure(s) with comments: Revision of bilateral breast reconstruction (Bilateral) - 2 hours LIPOSUCTION with excision of lateral breast and axillary tissue (Bilateral) as a surgical intervention.  The patient's history has been reviewed, patient examined, no change in status, stable for surgery.  I have reviewed the patient's chart and labs.  Questions were answered to the patient's satisfaction.     Cindra Presume

## 2020-05-07 NOTE — Brief Op Note (Signed)
05/07/2020  10:12 AM  PATIENT:  Leah [name]  60 y.o. female  PRE-OPERATIVE DIAGNOSIS:  history of bilateral mastectomies for breast cancer  POST-OPERATIVE DIAGNOSIS:  history of bilateral mastectomies for breast cancer  PROCEDURE:  Procedure(s) with comments: Revision of bilateral breast reconstruction (Bilateral) - 2 hours LIPOSUCTION with excision of lateral breast and axillary tissue (Bilateral)  SURGEON:  Surgeon(s) and Role:    * Nayellie Sanseverino, Steffanie Dunn, MD - Primary  PHYSICIAN ASSISTANT: [place], RNFA  ASSISTANTS: none   ANESTHESIA:   general  EBL:  40   BLOOD ADMINISTERED:none  DRAINS: none   LOCAL MEDICATIONS USED:  MARCAINE     SPECIMEN:  Source of Specimen:  right and left breast, axillary, and back tissue  DISPOSITION OF SPECIMEN:  PATHOLOGY  COUNTS:  YES  TOURNIQUET:  * No tourniquets in log *  DICTATION: .Dragon Dictation  PLAN OF CARE: Discharge to home after PACU  PATIENT DISPOSITION:  PACU - hemodynamically stable.   Delay start of Pharmacological VTE agent (>24hrs) due to surgical blood loss or risk of bleeding: not applicable

## 2020-05-08 ENCOUNTER — Encounter (HOSPITAL_BASED_OUTPATIENT_CLINIC_OR_DEPARTMENT_OTHER): Payer: Self-pay | Admitting: Plastic Surgery

## 2020-05-08 ENCOUNTER — Other Ambulatory Visit (HOSPITAL_BASED_OUTPATIENT_CLINIC_OR_DEPARTMENT_OTHER): Payer: 59

## 2020-05-08 LAB — SURGICAL PATHOLOGY

## 2020-05-09 ENCOUNTER — Other Ambulatory Visit: Payer: Self-pay | Admitting: Family Medicine

## 2020-05-16 ENCOUNTER — Other Ambulatory Visit: Payer: Self-pay

## 2020-05-16 ENCOUNTER — Ambulatory Visit (INDEPENDENT_AMBULATORY_CARE_PROVIDER_SITE_OTHER): Payer: 59 | Admitting: Plastic Surgery

## 2020-05-16 ENCOUNTER — Encounter: Payer: Self-pay | Admitting: Plastic Surgery

## 2020-05-16 VITALS — BP 127/82 | HR 88

## 2020-05-16 DIAGNOSIS — Z9013 Acquired absence of bilateral breasts and nipples: Secondary | ICD-10-CM

## 2020-05-16 NOTE — Progress Notes (Signed)
Patient presents when we postop from excision of excess lateral breast and ask Fritz Pickerel tissue. She feels like things are going well. She noticed a little bit of drainage coming from the right side but otherwise things have been fine. On examination everything looks to be doing well overall. There's a small 3 cm separation along the wound on the right incision in her mid back area. This is separated only about a millimeter or two. There's no purulence are signs of infection anywhere throughout. Will plan to continue compressive garment and avoid strenuous activity. I'll plan to see her again in three weeks. All of her questions were answered.

## 2020-05-18 ENCOUNTER — Other Ambulatory Visit: Payer: Self-pay | Admitting: Family Medicine

## 2020-05-18 ENCOUNTER — Other Ambulatory Visit: Payer: Self-pay | Admitting: *Deleted

## 2020-05-18 DIAGNOSIS — C7B8 Other secondary neuroendocrine tumors: Secondary | ICD-10-CM

## 2020-05-21 ENCOUNTER — Inpatient Hospital Stay (HOSPITAL_BASED_OUTPATIENT_CLINIC_OR_DEPARTMENT_OTHER): Payer: 59 | Admitting: Hematology & Oncology

## 2020-05-21 ENCOUNTER — Other Ambulatory Visit: Payer: Self-pay

## 2020-05-21 ENCOUNTER — Encounter: Payer: Self-pay | Admitting: Hematology & Oncology

## 2020-05-21 ENCOUNTER — Inpatient Hospital Stay: Payer: 59 | Attending: Hematology & Oncology

## 2020-05-21 ENCOUNTER — Telehealth: Payer: Self-pay

## 2020-05-21 ENCOUNTER — Inpatient Hospital Stay: Payer: 59

## 2020-05-21 VITALS — BP 136/102 | HR 97 | Temp 98.6°F | Resp 16 | Wt 209.0 lb

## 2020-05-21 DIAGNOSIS — C7B8 Other secondary neuroendocrine tumors: Secondary | ICD-10-CM

## 2020-05-21 DIAGNOSIS — C7A00 Malignant carcinoid tumor of unspecified site: Secondary | ICD-10-CM | POA: Insufficient documentation

## 2020-05-21 DIAGNOSIS — C7B02 Secondary carcinoid tumors of liver: Secondary | ICD-10-CM | POA: Insufficient documentation

## 2020-05-21 LAB — CBC WITH DIFFERENTIAL (CANCER CENTER ONLY)
Abs Immature Granulocytes: 0.03 10*3/uL (ref 0.00–0.07)
Basophils Absolute: 0.1 10*3/uL (ref 0.0–0.1)
Basophils Relative: 1 %
Eosinophils Absolute: 0.2 10*3/uL (ref 0.0–0.5)
Eosinophils Relative: 2 %
HCT: 37.3 % (ref 36.0–46.0)
Hemoglobin: 12.4 g/dL (ref 12.0–15.0)
Immature Granulocytes: 0 %
Lymphocytes Relative: 24 %
Lymphs Abs: 2.7 10*3/uL (ref 0.7–4.0)
MCH: 31.5 pg (ref 26.0–34.0)
MCHC: 33.2 g/dL (ref 30.0–36.0)
MCV: 94.7 fL (ref 80.0–100.0)
Monocytes Absolute: 0.8 10*3/uL (ref 0.1–1.0)
Monocytes Relative: 8 %
Neutro Abs: 7.2 10*3/uL (ref 1.7–7.7)
Neutrophils Relative %: 65 %
Platelet Count: 338 10*3/uL (ref 150–400)
RBC: 3.94 MIL/uL (ref 3.87–5.11)
RDW: 13.6 % (ref 11.5–15.5)
WBC Count: 11.1 10*3/uL — ABNORMAL HIGH (ref 4.0–10.5)
nRBC: 0 % (ref 0.0–0.2)

## 2020-05-21 LAB — CMP (CANCER CENTER ONLY)
ALT: 24 U/L (ref 0–44)
AST: 19 U/L (ref 15–41)
Albumin: 4.1 g/dL (ref 3.5–5.0)
Alkaline Phosphatase: 75 U/L (ref 38–126)
Anion gap: 10 (ref 5–15)
BUN: 13 mg/dL (ref 8–23)
CO2: 27 mmol/L (ref 22–32)
Calcium: 9.6 mg/dL (ref 8.9–10.3)
Chloride: 102 mmol/L (ref 98–111)
Creatinine: 0.82 mg/dL (ref 0.44–1.00)
GFR, Estimated: >60 mL/min (ref >60)
Glucose, Bld: 118 mg/dL — ABNORMAL HIGH (ref 70–99)
Potassium: 4.1 mmol/L (ref 3.5–5.1)
Sodium: 139 mmol/L (ref 135–145)
Total Bilirubin: 0.5 mg/dL (ref 0.3–1.2)
Total Protein: 7 g/dL (ref 6.5–8.1)

## 2020-05-21 LAB — LACTATE DEHYDROGENASE: LDH: 221 U/L — ABNORMAL HIGH (ref 98–192)

## 2020-05-21 MED ORDER — LANREOTIDE ACETATE 120 MG/0.5ML ~~LOC~~ SOLN
120.0000 mg | Freq: Once | SUBCUTANEOUS | Status: AC
  Administered 2020-05-21: 120 mg via SUBCUTANEOUS

## 2020-05-21 NOTE — Patient Instructions (Signed)
Lanreotide injection (Somatuline) What is this medicine? LANREOTIDE (lan REE oh tide) is used to reduce blood levels of growth hormone in patients with a condition called acromegaly. It also works to slow or stop tumor growth in patients with neuroendocrine tumors and treat carcinoid syndrome. This medicine may be used for other purposes; ask your health care provider or pharmacist if you have questions. COMMON BRAND NAME(S): Somatuline [place] What should I tell my health care provider before I take this medicine? They need to know if you have any of these conditions:  diabetes  gallbladder disease  heart disease  kidney disease  liver disease  thyroid disease  an unusual or allergic reaction to lanreotide, other medicines, foods, dyes, or preservatives  pregnant or trying to get pregnant  breast-feeding How should I use this medicine? This medicine is for injection under the skin. It is given by a health care professional in a hospital or clinic setting. Contact your pediatrician or health care professional regarding the use of this medicine in children. Special care may be needed. Overdosage: If you think you have taken too much of this medicine contact a poison control center or emergency room at once. NOTE: This medicine is only for you. Do not share this medicine with others. What if I miss a dose? It is important not to miss your dose. Call your doctor or health care professional if you are unable to keep an appointment. What may interact with this medicine? This medicine may interact with the following medications:  bromocriptine  cyclosporine  certain medicines for blood pressure, heart disease, irregular heart beat  certain medicines for diabetes  quinidine  terfenadine This list may not describe all possible interactions. Give your health care provider a list of all the medicines, herbs, non-prescription drugs, or dietary supplements you use. Also tell them if  you smoke, drink alcohol, or use illegal drugs. Some items may interact with your medicine. What should I watch for while using this medicine? Tell your doctor or healthcare professional if your symptoms do not start to get better or if they get worse. Visit your doctor or health care professional for regular checks on your progress. Your condition will be monitored carefully while you are receiving this medicine. This medicine may increase blood sugar. Ask your healthcare provider if changes in diet or medicines are needed if you have diabetes. You may need blood work done while you are taking this medicine. Women should inform their doctor if they wish to become pregnant or think they might be pregnant. There is a potential for serious side effects to an unborn child. Talk to your health care professional or pharmacist for more information. Do not breast-feed an infant while taking this medicine or for 6 months after stopping it. This medicine has caused ovarian failure in some women. This medicine may interfere with the ability to have a child. Talk with your doctor or health care professional if you are concerned about your fertility. What side effects may I notice from receiving this medicine? Side effects that you should report to your doctor or health care professional as soon as possible:  allergic reactions like skin rash, itching or hives, swelling of the face, lips, or tongue  increased blood pressure  severe stomach pain  signs and symptoms of hgh blood sugar such as being more thirsty or hungry or having to urinate more than normal. You may also feel very tired or have blurry vision.  signs and symptoms of low  blood sugar such as feeling anxious; confusion; dizziness; increased hunger; unusually weak or tired; sweating; shakiness; cold; irritable; headache; blurred vision; fast heartbeat; loss of consciousness  unusually slow heartbeat Side effects that usually do not require  medical attention (report to your doctor or health care professional if they continue or are bothersome):  constipation  diarrhea  dizziness  headache  muscle pain  muscle spasms  nausea  pain, redness, or irritation at site where injected This list may not describe all possible side effects. Call your doctor for medical advice about side effects. You may report side effects to FDA at 1-800-FDA-1088. Where should I keep my medicine? This drug is given in a hospital or clinic and will not be stored at home. NOTE: This sheet is a summary. It may not cover all possible information. If you have questions about this medicine, talk to your doctor, pharmacist, or health care provider.  2021 Elsevier/Gold Standard (2017-11-05 09:13:08)

## 2020-05-21 NOTE — Telephone Encounter (Signed)
appts made per 05/21/20 los and she stated she will  [facility] on [employer]

## 2020-05-21 NOTE — Progress Notes (Signed)
Hematology and Oncology Follow Up Visit  Leah [name] [id number] [birth year] 60 y.o. 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 Leah [name] is here today for follow-up and Somatuline injection.  She actually had some reconstructive surgery recently.  She had low thing as breast reduction.  She had a lot of loose skin and fluid on the size of her breast.  She had outpatient surgery for this.  She really looks fantastic.  The reconstructive surgeon did a great job.  She has had really no other issues.  There is been no problems with the carcinoid.  Her last Chromogranin A level was 120.  This is holding pretty steady.  She is had no issues with diarrhea.  She has had a little bit of nausea.  There is been no problems with fever.  She has had no leg swelling.  She has had no rashes.  Patient does have this unusual rash on the top of her feet.  She went to see a dermatologist.  I think she was given some antifungal cream for this.  She has has had no issues with the COVID.  Overall, I would have to say that her performance status is ECOG 1.    Medications:  Allergies as of 05/21/2020   No Known Allergies     Medication List       Accurate as of May 21, 2020  1:26 PM. If you have any questions, ask your nurse or doctor.        STOP taking these medications   ondansetron 4 MG disintegrating tablet Commonly known as: ZOFRAN-ODT Stopped by: [name], MD     TAKE these medications   amLODipine 10 MG tablet Commonly known as: NORVASC TAKE 1 TABLET BY MOUTH EVERY DAY   aspirin 81 MG EC tablet TAKE 1 TABLET BY MOUTH EVERY DAY *SWALLOW WHOLE*   clonazePAM 0.5 MG tablet Commonly known as: KLONOPIN TAKE 1 TABLET BY MOUTH TWICE A DAY AS NEEDED FOR ANXIETY   econazole nitrate 1 % cream APPLY A  SMALL AMOUNT TO AFFECTED AREA ONCE A DAY   enoxaparin 40 MG/0.4ML injection Commonly known as: LOVENOX Inject 0.4 mLs (40 mg total) into the skin daily for 7 days. Recommend starting the day after surgery.   fexofenadine 180 MG tablet Commonly known as: ALLEGRA TAKE 1 TABLET BY MOUTH EVERY DAY   gabapentin 100 MG capsule Commonly known as: NEURONTIN TAKE 1 CAPSULE (100 MG TOTAL) BY MOUTH AT BEDTIME.   griseofulvin 500 MG tablet Commonly known as: GRIFULVIN V Take 500 mg by mouth daily.   metFORMIN 500 MG tablet Commonly known as: GLUCOPHAGE Take 1 tablet (500 mg total) by mouth 2 (two) times daily with a meal.   metoprolol succinate 100 MG 24 hr tablet Commonly known as: TOPROL-XL TAKE 1 TABLET BY MOUTH EVERY EVENING WITH OR IMMEDIATELY FOLLOWING A MEAL   montelukast 10 MG tablet Commonly known as: SINGULAIR TAKE 1 TABLET BY MOUTH EVERYDAY AT BEDTIME   naproxen 500 MG tablet Commonly known as: NAPROSYN TAKE 1 TABLET TWICE A DAY WITH A MEAL   octreotide 30 MG injection Commonly known as: SANDOSTATIN LAR Inject 30 mg into the muscle every 28 (twenty-eight) days.   ondansetron 4 MG tablet Commonly known as: Zofran Take 1 tablet (4 mg total) by mouth every 8 (eight)  hours as needed for nausea or vomiting.   Ozempic (0.25 or 0.5 MG/DOSE) 2 MG/1.5ML Sopn Generic drug: Semaglutide(0.25 or 0.5MG /DOS) Inject 0.5 mg into the skin once a week.   pantoprazole 40 MG tablet Commonly known as: PROTONIX TAKE 1 TABLET BY MOUTH TWICE A DAY   ProAir HFA 108 (90 Base) MCG/ACT inhaler Generic drug: albuterol INHALE 2 PUFFS INTO THE LUNGS EVERY 6 (SIX) HOURS AS NEEDED. FOR SHORTNESS OF BREATH. What changed: See the new instructions.   rosuvastatin 10 MG tablet Commonly known as: Crestor Take 1 tablet (10 mg total) by mouth daily.   valACYclovir 1000 MG tablet Commonly known as: VALTREX Take by mouth.   valsartan 320 MG tablet Commonly known as: DIOVAN TAKE 1 TABLET BY MOUTH  EVERY DAY   venlafaxine XR 150 MG 24 hr capsule Commonly known as: EFFEXOR-XR TAKE 1 CAPSULE BY MOUTH EACH MORNING       Allergies: No Known Allergies  Past Medical History, Surgical history, Social history, and Family History were reviewed and updated.  Review of Systems: Review of Systems  Constitutional: Negative.   HENT: Negative.   Eyes: Negative.   Respiratory: Negative.   Cardiovascular: Negative.   Gastrointestinal: Negative.   Genitourinary: Negative.   Musculoskeletal: Negative.   Skin: Negative.   Neurological: Negative.   Endo/Heme/Allergies: Negative.   Psychiatric/Behavioral: Negative.      Physical Exam:  weight is 209 lb (94.8 kg). Her oral temperature is 98.6 F (37 C). Her blood pressure is 136/102 (abnormal) and her pulse is 97. Her respiration is 16 and oxygen saturation is 98%.   Wt Readings from Last 3 Encounters:  05/21/20 209 lb (94.8 kg)  05/07/20 211 lb 3.2 oz (95.8 kg)  05/01/20 213 lb 9.6 oz (96.9 kg)    Physical Exam Vitals reviewed.  HENT:     Head: Normocephalic and atraumatic.  Eyes:     Pupils: Pupils are equal, round, and reactive to light.  Cardiovascular:     Rate and Rhythm: Normal rate and regular rhythm.     Heart sounds: Normal heart sounds.  Pulmonary:     Effort: Pulmonary effort is normal.     Breath sounds: Normal breath sounds.  Abdominal:     General: Bowel sounds are normal.     Palpations: Abdomen is soft.     Comments: Abdominal exam shows an obese abdomen that is soft.  She has good bowel sounds.  There is no fluid wave.  She has laparotomy scars that are well-healed.  There is no tenderness.  There is no guarding or rebound.  There is no palpable liver or spleen tip.  Musculoskeletal:        General: No tenderness or deformity. Normal range of motion.     Cervical back: Normal range of motion.  Lymphadenopathy:     Cervical: No cervical adenopathy.  Skin:    General: Skin is warm and dry.     Findings:  No erythema or rash.  Neurological:     Mental Status: She is alert and oriented to person, place, and time.  Psychiatric:        Behavior: Behavior normal.        Thought Content: Thought content normal.        Judgment: Judgment normal.      Lab Results  Component Value Date   WBC 11.1 (H) 05/21/2020   HGB 12.4 05/21/2020   HCT 37.3 05/21/2020   MCV 94.7 05/21/2020   PLT 338  05/21/2020   Lab Results  Component Value Date   FERRITIN 382 (H) 10/18/2008   IRON <10 (L) 10/18/2008   TIBC NOT CALC Not calculated due to Iron <10. 10/18/2008   UIBC 156 10/18/2008   IRONPCTSAT NOT CALC Not calculated due to Iron <10. 10/18/2008   Lab Results  Component Value Date   RETICCTPCT 0.9 10/18/2008   RBC 3.94 05/21/2020   No results found for: KPAFRELGTCHN, LAMBDASER, KAPLAMBRATIO No results found for: IGGSERUM, IGA, IGMSERUM No results found for: Odetta Pink, SPEI   Chemistry      Component Value Date/Time   NA 139 05/21/2020 1205   NA 140 01/20/2017 1416   NA 141 01/17/2016 1047   K 4.1 05/21/2020 1205   K 3.9 01/20/2017 1416   K 3.2 (L) 01/17/2016 1047   CL 102 05/21/2020 1205   CL 102 01/20/2017 1416   CO2 27 05/21/2020 1205   CO2 26 01/20/2017 1416   CO2 27 01/17/2016 1047   BUN 13 05/21/2020 1205   BUN 15 01/20/2017 1416   BUN 6.6 (L) 01/17/2016 1047   CREATININE 0.82 05/21/2020 1205   CREATININE 1.1 01/20/2017 1416   CREATININE 0.7 01/17/2016 1047      Component Value Date/Time   CALCIUM 9.6 05/21/2020 1205   CALCIUM 9.1 01/20/2017 1416   CALCIUM 8.7 01/17/2016 1047   ALKPHOS 75 05/21/2020 1205   ALKPHOS 84 01/20/2017 1416   ALKPHOS 90 01/17/2016 1047   AST 19 05/21/2020 1205   AST 34 01/17/2016 1047   ALT 24 05/21/2020 1205   ALT 35 01/20/2017 1416   ALT 38 01/17/2016 1047   BILITOT 0.5 05/21/2020 1205   BILITOT 0.42 01/17/2016 1047       Impression and Plan: Ms. Kutsch is a very pleasant 60 yo  caucasian female withlong standing history ofmetastaticcarcinoidwith recurrent liver metastasis.  We will have to see what the chromogranin A level is.  Again, she tends to fluctuate.  She said that she is I have to go back to [facility] until November.  I think this is wonderful.  I am glad she got through this reconstructive surgery.  She really looks good.  She feels better.  She has more mobility.  We have her Somatuline every month.  We will have her come back to see [place] in about 3 months.   [name], MD 4/11/20221:26 PM

## 2020-05-23 ENCOUNTER — Other Ambulatory Visit: Payer: Self-pay | Admitting: Family Medicine

## 2020-05-23 ENCOUNTER — Encounter (INDEPENDENT_AMBULATORY_CARE_PROVIDER_SITE_OTHER): Payer: Self-pay

## 2020-05-23 ENCOUNTER — Other Ambulatory Visit (INDEPENDENT_AMBULATORY_CARE_PROVIDER_SITE_OTHER): Payer: Self-pay | Admitting: Family Medicine

## 2020-05-23 DIAGNOSIS — E1169 Type 2 diabetes mellitus with other specified complication: Secondary | ICD-10-CM

## 2020-05-23 DIAGNOSIS — M4722 Other spondylosis with radiculopathy, cervical region: Secondary | ICD-10-CM

## 2020-05-23 NOTE — Telephone Encounter (Signed)
Message sent to pt.

## 2020-05-23 NOTE — Telephone Encounter (Signed)
Pt last seen by Dr. Wallace.  

## 2020-05-25 LAB — CHROMOGRANIN A: Chromogranin A (ng/mL): 135 ng/mL — ABNORMAL HIGH (ref 0.0–101.8)

## 2020-05-29 ENCOUNTER — Encounter: Payer: 59 | Admitting: Surgical

## 2020-05-30 ENCOUNTER — Encounter (INDEPENDENT_AMBULATORY_CARE_PROVIDER_SITE_OTHER): Payer: Self-pay | Admitting: Family Medicine

## 2020-05-30 ENCOUNTER — Ambulatory Visit (INDEPENDENT_AMBULATORY_CARE_PROVIDER_SITE_OTHER): Payer: 59 | Admitting: Family Medicine

## 2020-05-30 ENCOUNTER — Other Ambulatory Visit: Payer: Self-pay

## 2020-05-30 VITALS — BP 146/78 | HR 69 | Temp 97.1°F | Ht 59.0 in | Wt 209.0 lb

## 2020-05-30 DIAGNOSIS — M25562 Pain in left knee: Secondary | ICD-10-CM

## 2020-05-30 DIAGNOSIS — Z6841 Body Mass Index (BMI) 40.0 and over, adult: Secondary | ICD-10-CM

## 2020-05-30 DIAGNOSIS — E1169 Type 2 diabetes mellitus with other specified complication: Secondary | ICD-10-CM | POA: Diagnosis not present

## 2020-05-30 DIAGNOSIS — Z9189 Other specified personal risk factors, not elsewhere classified: Secondary | ICD-10-CM

## 2020-05-30 DIAGNOSIS — G8929 Other chronic pain: Secondary | ICD-10-CM

## 2020-05-30 DIAGNOSIS — F3289 Other specified depressive episodes: Secondary | ICD-10-CM

## 2020-05-30 DIAGNOSIS — M25561 Pain in right knee: Secondary | ICD-10-CM

## 2020-05-30 MED ORDER — OZEMPIC (1 MG/DOSE) 4 MG/3ML ~~LOC~~ SOPN: 1.0000 mg | PEN_INJECTOR | SUBCUTANEOUS | 0 refills | Status: DC

## 2020-05-31 NOTE — Progress Notes (Signed)
Chief Complaint:   OBESITY Leah [name] is here to discuss her progress with her obesity treatment plan along with follow-up of her obesity related diagnoses.   Today's visit was #: 13 Starting weight: 226 lbs Starting date: 07/07/2019 Today's weight: 209 lbs Today's date: 05/30/2020 Total lbs lost to date: 17 lbs Body mass index is 42.21 kg/m.  Total weight loss percentage to date: -7.52%  Interim History:  Leah [name] says she is feeling better and healing well.  She has increased celebration eating. Current Meal Plan: the Category 2 Plan for 98% of the time.  Current Exercise Plan: None. Current Anti-Obesity Medications: Ozempic 0.5 mg subcutaneously weekly. Side effects: None.  Assessment/Plan:   1. Type 2 diabetes mellitus with other specified complication, without long-term current use of insulin (HCC) Diabetes Mellitus: Not at goal. Medication: Ozempic 0.5 mg subcutaneously weekly. Issues reviewed: blood sugar goals, complications of diabetes mellitus, hypoglycemia prevention and treatment, exercise, and nutrition.   Plan:  Increase Ozempic to 1 mg subcutaneously weekly.  She will continue to focus on protein-rich, low simple carbohydrate foods. We reviewed the importance of hydration, regular exercise for stress reduction, and restorative sleep.   Lab Results  Component Value Date   HGBA1C 6.4 (H) 03/05/2020   HGBA1C 6.8 (H) 11/02/2019   HGBA1C 7.3 (H) 07/06/2019   Lab Results  Component Value Date   MICROALBUR 0.9 07/06/2019   LDLCALC 63 11/02/2019   CREATININE 0.82 05/21/2020   - Increase Semaglutide, 1 MG/DOSE, (OZEMPIC, 1 MG/DOSE,) 4 MG/3ML SOPN; Inject 1 mg into the skin once a week.  Dispense: 3 mL; Refill: 0  2. Chronic pain of both knees We will continue to monitor symptoms as they relate toherweight loss journey.  3. Other depression with emotional eating Controlled. Medication: Effexor-XR 150 mg daily.  Plan:  Behavior modification techniques were  discussed today to help deal with emotional/non-hunger eating behaviors.  4. At risk for heart disease Due to Leah [name]'s current state of health and medical condition(s), she is at a higher risk for heart disease.  This puts the patient at much greater risk to subsequently develop cardiopulmonary conditions that can significantly affect patient's quality of life in a negative manner.    At least 8 minutes were spent on counseling Leah [name] about these concerns today. Evidence-based interventions for health behavior change were utilized today including the discussion of self monitoring techniques, problem-solving barriers, and SMART goal setting techniques.  Specifically, regarding patient's less desirable eating habits and patterns, we employed the technique of small changes when Leah [name] has not been able to fully commit to her prudent nutritional plan.  5. Obesity, current BMI 42.3  Course: Leah [name] is currently in the action stage of change. As such, her goal is to continue with weight loss efforts.   Nutrition goals: She has agreed to the Category 2 Plan.   Exercise goals: For substantial health benefits, adults should do at least 150 minutes (2 hours and 30 minutes) a week of moderate-intensity, or 75 minutes (1 hour and 15 minutes) a week of vigorous-intensity aerobic physical activity, or an equivalent combination of moderate- and vigorous-intensity aerobic activity. Aerobic activity should be performed in episodes of at least 10 minutes, and preferably, it should be spread throughout the week.  Behavioral modification strategies: increasing lean protein intake, decreasing simple carbohydrates and increasing vegetables.  Leah [name] has agreed to follow-up with our clinic in 4 weeks. She was informed of the importance of frequent follow-up visits to maximize her success with  intensive lifestyle modifications for her multiple health conditions.   Objective:   Blood pressure (!) 146/78, pulse 69,  temperature (!) 97.1 F (36.2 C), temperature source Oral, height 4\' 11"  (1.499 m), weight 209 lb (94.8 kg), SpO2 98 %. Body mass index is 42.21 kg/m.  General: Cooperative, alert, well developed, in no acute distress. HEENT: Conjunctivae and lids unremarkable. Cardiovascular: Regular rhythm.  Lungs: Normal work of breathing. Neurologic: No focal deficits.   Lab Results  Component Value Date   CREATININE 0.82 05/21/2020   BUN 13 05/21/2020   NA 139 05/21/2020   K 4.1 05/21/2020   CL 102 05/21/2020   CO2 27 05/21/2020   Lab Results  Component Value Date   ALT 24 05/21/2020   AST 19 05/21/2020   ALKPHOS 75 05/21/2020   BILITOT 0.5 05/21/2020   Lab Results  Component Value Date   HGBA1C 6.4 (H) 03/05/2020   HGBA1C 6.8 (H) 11/02/2019   HGBA1C 7.3 (H) 07/06/2019   HGBA1C 7.1 (H) 12/24/2018   HGBA1C 8.1 (A) 08/31/2018   Lab Results  Component Value Date   TSH 2.720 03/05/2020   Lab Results  Component Value Date   CHOL 140 11/02/2019   HDL 58 11/02/2019   LDLCALC 63 11/02/2019   TRIG 111 11/02/2019   CHOLHDL 2.4 11/02/2019   Lab Results  Component Value Date   WBC 11.1 (H) 05/21/2020   HGB 12.4 05/21/2020   HCT 37.3 05/21/2020   MCV 94.7 05/21/2020   PLT 338 05/21/2020   Lab Results  Component Value Date   IRON <10 (L) 10/18/2008   TIBC NOT CALC Not calculated due to Iron <10. 10/18/2008   FERRITIN 382 (H) 10/18/2008   Attestation Statements:   Reviewed by clinician on day of visit: allergies, medications, problem list, medical history, surgical history, family history, social history, and previous encounter notes.  I, Water quality scientist, CMA, am acting as transcriptionist for Briscoe Deutscher, DO  I have reviewed the above documentation for accuracy and completeness, and I agree with the above. Briscoe Deutscher, DO

## 2020-06-07 ENCOUNTER — Other Ambulatory Visit: Payer: Self-pay

## 2020-06-07 ENCOUNTER — Ambulatory Visit (INDEPENDENT_AMBULATORY_CARE_PROVIDER_SITE_OTHER): Payer: 59 | Admitting: Plastic Surgery

## 2020-06-07 DIAGNOSIS — Z9013 Acquired absence of bilateral breasts and nipples: Secondary | ICD-10-CM

## 2020-06-07 NOTE — Progress Notes (Signed)
Patient presents about 2 months out from revision of bilateral breast reconstruction with excision of excess axillary and back tissue.  She feels good and is happy with the result.  On exam everything looks to be healing okay.  There is 1 spot along the right incision in the back that is slow to heal and its about a millimeter or 2 in size.  Otherwise the contour is good and there is no signs of any complication.  She is again very happy with her improved contour and functionality after the excision.  She is planning to come back and get nipple tattoos with Doroteo Bradford so we will plan to check in on that small wound at that time.  All her questions were answered.

## 2020-06-18 ENCOUNTER — Inpatient Hospital Stay: Payer: 59 | Attending: Hematology & Oncology

## 2020-06-18 ENCOUNTER — Other Ambulatory Visit: Payer: Self-pay

## 2020-06-18 VITALS — BP 143/87 | HR 84 | Temp 98.0°F | Resp 17

## 2020-06-18 DIAGNOSIS — C7A00 Malignant carcinoid tumor of unspecified site: Secondary | ICD-10-CM | POA: Insufficient documentation

## 2020-06-18 DIAGNOSIS — C7B02 Secondary carcinoid tumors of liver: Secondary | ICD-10-CM | POA: Insufficient documentation

## 2020-06-18 DIAGNOSIS — C7B8 Other secondary neuroendocrine tumors: Secondary | ICD-10-CM

## 2020-06-18 MED ORDER — LANREOTIDE ACETATE 120 MG/0.5ML ~~LOC~~ SOLN
120.0000 mg | Freq: Once | SUBCUTANEOUS | Status: AC
  Administered 2020-06-18: 120 mg via SUBCUTANEOUS

## 2020-06-18 NOTE — Patient Instructions (Signed)
Lanreotide injection What is this medicine? LANREOTIDE (lan REE oh tide) is used to reduce blood levels of growth hormone in patients with a condition called acromegaly. It also works to slow or stop tumor growth in patients with neuroendocrine tumors and treat carcinoid syndrome. This medicine may be used for other purposes; ask your health care provider or pharmacist if you have questions. COMMON BRAND NAME(S): Somatuline [place] What should I tell my health care provider before I take this medicine? They need to know if you have any of these conditions:  diabetes  gallbladder disease  heart disease  kidney disease  liver disease  thyroid disease  an unusual or allergic reaction to lanreotide, other medicines, foods, dyes, or preservatives  pregnant or trying to get pregnant  breast-feeding How should I use this medicine? This medicine is for injection under the skin. It is given by a health care professional in a hospital or clinic setting. Contact your pediatrician or health care professional regarding the use of this medicine in children. Special care may be needed. Overdosage: If you think you have taken too much of this medicine contact a poison control center or emergency room at once. NOTE: This medicine is only for you. Do not share this medicine with others. What if I miss a dose? It is important not to miss your dose. Call your doctor or health care professional if you are unable to keep an appointment. What may interact with this medicine? This medicine may interact with the following medications:  bromocriptine  cyclosporine  certain medicines for blood pressure, heart disease, irregular heart beat  certain medicines for diabetes  quinidine  terfenadine This list may not describe all possible interactions. Give your health care provider a list of all the medicines, herbs, non-prescription drugs, or dietary supplements you use. Also tell them if you smoke,  drink alcohol, or use illegal drugs. Some items may interact with your medicine. What should I watch for while using this medicine? Tell your doctor or healthcare professional if your symptoms do not start to get better or if they get worse. Visit your doctor or health care professional for regular checks on your progress. Your condition will be monitored carefully while you are receiving this medicine. This medicine may increase blood sugar. Ask your healthcare provider if changes in diet or medicines are needed if you have diabetes. You may need blood work done while you are taking this medicine. Women should inform their doctor if they wish to become pregnant or think they might be pregnant. There is a potential for serious side effects to an unborn child. Talk to your health care professional or pharmacist for more information. Do not breast-feed an infant while taking this medicine or for 6 months after stopping it. This medicine has caused ovarian failure in some women. This medicine may interfere with the ability to have a child. Talk with your doctor or health care professional if you are concerned about your fertility. What side effects may I notice from receiving this medicine? Side effects that you should report to your doctor or health care professional as soon as possible:  allergic reactions like skin rash, itching or hives, swelling of the face, lips, or tongue  increased blood pressure  severe stomach pain  signs and symptoms of hgh blood sugar such as being more thirsty or hungry or having to urinate more than normal. You may also feel very tired or have blurry vision.  signs and symptoms of low blood   sugar such as feeling anxious; confusion; dizziness; increased hunger; unusually weak or tired; sweating; shakiness; cold; irritable; headache; blurred vision; fast heartbeat; loss of consciousness  unusually slow heartbeat Side effects that usually do not require medical  attention (report to your doctor or health care professional if they continue or are bothersome):  constipation  diarrhea  dizziness  headache  muscle pain  muscle spasms  nausea  pain, redness, or irritation at site where injected This list may not describe all possible side effects. Call your doctor for medical advice about side effects. You may report side effects to FDA at 1-800-FDA-1088. Where should I keep my medicine? This drug is given in a hospital or clinic and will not be stored at home. NOTE: This sheet is a summary. It may not cover all possible information. If you have questions about this medicine, talk to your doctor, pharmacist, or health care provider.  2021 Elsevier/Gold Standard (2017-11-05 09:13:08)  

## 2020-06-20 ENCOUNTER — Ambulatory Visit (INDEPENDENT_AMBULATORY_CARE_PROVIDER_SITE_OTHER): Payer: 59 | Admitting: Family Medicine

## 2020-06-20 ENCOUNTER — Encounter (INDEPENDENT_AMBULATORY_CARE_PROVIDER_SITE_OTHER): Payer: Self-pay | Admitting: Family Medicine

## 2020-06-20 ENCOUNTER — Other Ambulatory Visit: Payer: Self-pay

## 2020-06-20 VITALS — BP 145/85 | HR 85 | Temp 97.9°F | Ht 59.0 in | Wt 203.0 lb

## 2020-06-20 DIAGNOSIS — M25561 Pain in right knee: Secondary | ICD-10-CM

## 2020-06-20 DIAGNOSIS — M25562 Pain in left knee: Secondary | ICD-10-CM

## 2020-06-20 DIAGNOSIS — F3289 Other specified depressive episodes: Secondary | ICD-10-CM

## 2020-06-20 DIAGNOSIS — E66813 Obesity, class 3: Secondary | ICD-10-CM

## 2020-06-20 DIAGNOSIS — Z6841 Body Mass Index (BMI) 40.0 and over, adult: Secondary | ICD-10-CM

## 2020-06-20 DIAGNOSIS — G8929 Other chronic pain: Secondary | ICD-10-CM

## 2020-06-20 DIAGNOSIS — Z9189 Other specified personal risk factors, not elsewhere classified: Secondary | ICD-10-CM

## 2020-06-20 DIAGNOSIS — E1169 Type 2 diabetes mellitus with other specified complication: Secondary | ICD-10-CM

## 2020-06-26 ENCOUNTER — Other Ambulatory Visit: Payer: Self-pay | Admitting: Family Medicine

## 2020-06-26 DIAGNOSIS — F419 Anxiety disorder, unspecified: Secondary | ICD-10-CM

## 2020-06-27 NOTE — Progress Notes (Signed)
Chief Complaint:   OBESITY Leah [name] is here to discuss her progress with her obesity treatment plan along with follow-up of her obesity related diagnoses.   Today's visit was #: 14 Starting weight: 226 lbs Starting date: 07/07/2019 Today's weight: 203 lbs Today's date: 06/20/2020 Weight change since last visit: 6 lbs Total lbs lost to date: 23 lbs Body mass index is 41 kg/m.  Total weight loss percentage to date: -10.18%  Interim History:  Leah [name] needs a BMI under 40 for TKR.  She is doing well with her current plan and medications.  She says she has been doing less movement due to knee pain. Current Meal Plan: the Category 2 Plan for 90% of the time.  Current Exercise Plan: None. Current Anti-Obesity Medications: Ozempic 1 mg subcutaneously weekly. Side effects: None.  Assessment/Plan:   1. Type 2 diabetes mellitus with other specified complication, without long-term current use of insulin (HCC) Diabetes Mellitus: Not at goal. Medication: metformin 500 mg twice daily, Ozempic 1 mg subcutaneously weekly. Issues reviewed: blood sugar goals, complications of diabetes mellitus, hypoglycemia prevention and treatment, exercise, and nutrition.   Plan:  Continue Ozempic and metformin at current doses.  The patient will continue to focus on protein-rich, low simple carbohydrate foods. We reviewed the importance of hydration, regular exercise for stress reduction, and restorative sleep.   Lab Results  Component Value Date   HGBA1C 6.4 (H) 03/05/2020   HGBA1C 6.8 (H) 11/02/2019   HGBA1C 7.3 (H) 07/06/2019   Lab Results  Component Value Date   MICROALBUR 0.9 07/06/2019   LDLCALC 63 11/02/2019   CREATININE 0.82 05/21/2020   2. Chronic pain of both knees Leah [name] needs a BMI under 40 in order to have TKR.  Plan:  We will continue to monitor symptoms as they relate toherweight loss journey.  3. Other depression with emotional eating Controlled. Medication: Effexor-XR 150 mg  daily.  Plan:  Discussed cues and consequences, how thoughts affect eating, model of thoughts, feelings, and behaviors, and strategies for change by focusing on the cue. Discussed cognitive distortions, coping thoughts, and how to change your thoughts.  4. At risk for heart disease Due to Leah [name]'s current state of health and medical condition(s), she is at a higher risk for heart disease.  This puts the patient at much greater risk to subsequently develop cardiopulmonary conditions that can significantly affect patient's quality of life in a negative manner.    At least 8 minutes were spent on counseling Leah [name] about these concerns today. Evidence-based interventions for health behavior change were utilized today including the discussion of self monitoring techniques, problem-solving barriers, and SMART goal setting techniques.  Specifically, regarding patient's less desirable eating habits and patterns, we employed the technique of small changes when Leah [name] has not been able to fully commit to her prudent nutritional plan.  5. Obesity, current BMI 41  Course: Leah [name] is currently in the action stage of change. As such, her goal is to continue with weight loss efforts.   Nutrition goals: She has agreed to the Category 2 Plan.   Exercise goals: All adults should avoid inactivity. Some physical activity is better than none, and adults who participate in any amount of physical activity gain some health benefits.  Behavioral modification strategies: increasing lean protein intake, decreasing simple carbohydrates, increasing vegetables and increasing water intake.  Leah [name] has agreed to follow-up with our clinic in 4 weeks. She was informed of the importance of frequent follow-up visits to maximize her success  with intensive lifestyle modifications for her multiple health conditions.   Objective:   Blood pressure (!) 145/85, pulse 85, temperature 97.9 F (36.6 C), temperature source Oral, height 4'  11" (1.499 m), weight 203 lb (92.1 kg), SpO2 98 %. Body mass index is 41 kg/m.  General: Cooperative, alert, well developed, in no acute distress. HEENT: Conjunctivae and lids unremarkable. Cardiovascular: Regular rhythm.  Lungs: Normal work of breathing. Neurologic: No focal deficits.   Lab Results  Component Value Date   CREATININE 0.82 05/21/2020   BUN 13 05/21/2020   NA 139 05/21/2020   K 4.1 05/21/2020   CL 102 05/21/2020   CO2 27 05/21/2020   Lab Results  Component Value Date   ALT 24 05/21/2020   AST 19 05/21/2020   ALKPHOS 75 05/21/2020   BILITOT 0.5 05/21/2020   Lab Results  Component Value Date   HGBA1C 6.4 (H) 03/05/2020   HGBA1C 6.8 (H) 11/02/2019   HGBA1C 7.3 (H) 07/06/2019   HGBA1C 7.1 (H) 12/24/2018   HGBA1C 8.1 (A) 08/31/2018   Lab Results  Component Value Date   TSH 2.720 03/05/2020   Lab Results  Component Value Date   CHOL 140 11/02/2019   HDL 58 11/02/2019   LDLCALC 63 11/02/2019   TRIG 111 11/02/2019   CHOLHDL 2.4 11/02/2019   Lab Results  Component Value Date   WBC 11.1 (H) 05/21/2020   HGB 12.4 05/21/2020   HCT 37.3 05/21/2020   MCV 94.7 05/21/2020   PLT 338 05/21/2020   Lab Results  Component Value Date   IRON <10 (L) 10/18/2008   TIBC NOT CALC Not calculated due to Iron <10. 10/18/2008   FERRITIN 382 (H) 10/18/2008   Attestation Statements:   Reviewed by clinician on day of visit: allergies, medications, problem list, medical history, surgical history, family history, social history, and previous encounter notes.  I, Water quality scientist, CMA, am acting as transcriptionist for Briscoe Deutscher, DO  I have reviewed the above documentation for accuracy and completeness, and I agree with the above. Briscoe Deutscher, DO

## 2020-07-02 ENCOUNTER — Other Ambulatory Visit: Payer: Self-pay

## 2020-07-02 ENCOUNTER — Emergency Department (INDEPENDENT_AMBULATORY_CARE_PROVIDER_SITE_OTHER)
Admission: RE | Admit: 2020-07-02 | Discharge: 2020-07-02 | Disposition: A | Discharge status: Home / Self Care | Payer: 59 | Source: Ambulatory Visit

## 2020-07-02 ENCOUNTER — Emergency Department (INDEPENDENT_AMBULATORY_CARE_PROVIDER_SITE_OTHER): Payer: 59

## 2020-07-02 ENCOUNTER — Telehealth: Payer: Self-pay | Admitting: Plastic Surgery

## 2020-07-02 VITALS — HR 99 | Temp 99.4°F | Resp 18

## 2020-07-02 DIAGNOSIS — R0789 Other chest pain: Secondary | ICD-10-CM

## 2020-07-02 DIAGNOSIS — M549 Dorsalgia, unspecified: Secondary | ICD-10-CM

## 2020-07-02 DIAGNOSIS — R059 Cough, unspecified: Secondary | ICD-10-CM | POA: Diagnosis not present

## 2020-07-02 DIAGNOSIS — M940 Chondrocostal junction syndrome [Tietze]: Secondary | ICD-10-CM

## 2020-07-02 MED ORDER — BACLOFEN 10 MG PO TABS: 10.0000 mg | ORAL_TABLET | Freq: Three times a day (TID) | ORAL | 0 refills | Status: DC

## 2020-07-02 NOTE — ED Triage Notes (Signed)
Left sided rib pain 3 days - pt unsure if it is posterior rib pain or lung pain  Hurts to take a deep breath  OTC ibuprofen last night - provided some pain relief  COVID 05/2020

## 2020-07-02 NOTE — ED Provider Notes (Signed)
[name]    CSN: [id number] Arrival date & time: 07/02/20  1603      History   Chief Complaint Chief Complaint  Patient presents with  . Cough  . 4P Appt    HPI Leah [name] is a 60 y.o. female.   HPI 60 year old female presents with left-sided rib pain for 3 days unsure if it is rib pain or lung pain hurts to take a deep breath.  Patient reports using OTC ibuprofen with some pain relief.  Reports COVID positive in April 2022.  Patient reports having bilateral mastectomy 10 years ago.  Reports having revision surgery of bilateral breast and superior next of left-sided/right-sided of torso in March of this year to remove excessive fat and fluid.  Past Medical History:  Diagnosis Date  . Allergic rhinitis   . Anxiety   . Atrophic vaginitis   . Back pain   . Borderline diabetes 03/17/2013  . 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 Constipation   . Diabetes ([place])   . Dyspnea   . Fatty liver   . Gallbladder problem   . GE reflux   . Hepatic encephalopathy ([place]) 2011  . History of PCR DNA positive for HSV1   . History of retinal vein occlusion, right eye   . HTN (hypertension) 2011  . Joint pain   . Liver problem   . Migraines   . Neuroendocrine tumor 2011  . Orthostatic hypotension   . Osteoarthritis   . Osteopenia   . Palpitations   . Sarcoidosis 2008  . Sleep apnea    uses cpap  . Surgical menopause     Patient Active Problem List   Diagnosis Date Noted  . Other fatigue 07/07/2019  . Hepatic steatosis 01/03/2019  . S/P right knee arthroscopy 10/19/2018  . Hyperlipidemia associated with type 2 diabetes mellitus ([place]) 09/01/2018  . Class 3 severe obesity with serious comorbidity and body mass index (BMI) of 45.0 to 49.9 in adult [facility]) 09/01/2018  . OSA on CPAP 05/04/2018  . Solitary pulmonary nodule 05/04/2018  . History of prophylactic mastectomy of both breasts 11/04/2017  . Atrophic vaginitis 04/12/2016  . Osteopenia 09/27/2015  . Branch retinal  vein occlusion of left eye 12/12/2014  . Hypertensive retinopathy of both eyes 12/12/2014  . Lung nodule seen on imaging study 05/01/2014  . Shortness of breath on exertion   . Type 2 diabetes mellitus with other specified complication ([place]) [id number]  . Morbid obesity ([place]) 03/01/2013  . 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 Hypertension associated with type 2 diabetes mellitus ([place]) 10/22/2010  . GERD (gastroesophageal reflux disease) 10/22/2010  . Allergic rhinitis 10/22/2010  . Sarcoidosis 10/22/2010  . Osteoarthritis 10/22/2010  . Anxiety 10/22/2010    Past Surgical History:  Procedure Laterality Date  . ABDOMINAL HYSTERECTOMY  2004  . AUGMENTATION MAMMAPLASTY    . BREAST BIOPSY Left   . BREAST RECONSTRUCTION Bilateral 05/07/2020   Procedure: Revision of bilateral breast reconstruction;  Surgeon: Cindra Presume, MD;  Location: [facility];  Service: Plastics;  Laterality: Bilateral;  2 hours  . CESAREAN SECTION  [id number]  . CHOLECYSTECTOMY  2006  . HERNIA REPAIR    . KNEE ARTHROSCOPY Left 09/2010  . LAPAROSCOPIC HEPATECTOMY    . LIPOSUCTION Bilateral 05/07/2020   Procedure: LIPOSUCTION with excision of lateral breast and axillary tissue;  Surgeon: Cindra Presume, MD;  Location:  [facility];  Service: Plastics;  Laterality: Bilateral;  . MASTECTOMY Bilateral 2008  . OOPHORECTOMY  2004  . RECONSTRUCTION BREAST W/ LATISSIMUS DORSI FLAP Bilateral   . TONSILLECTOMY  1976  . TUBAL LIGATION Bilateral 2001    OB History    Gravida  2   Para  2   Term  2   Preterm      AB      Living  2     SAB      IAB      Ectopic      Multiple      Live Births               Home Medications    Prior to Admission medications   Medication Sig Start Date End Date Taking? Authorizing Provider  amLODipine (NORVASC) 10 MG tablet TAKE 1 TABLET  BY MOUTH EVERY DAY 11/22/19  Yes Orma Flaming, MD  aspirin 81 MG EC tablet TAKE 1 TABLET BY MOUTH EVERY DAY *SWALLOW WHOLE* 04/11/20  Yes Orma Flaming, MD  baclofen (LIORESAL) 10 MG tablet Take 1 tablet (10 mg total) by mouth 3 (three) times daily. 07/02/20  Yes Eliezer Lofts, FNP  fexofenadine (ALLEGRA) 180 MG tablet TAKE 1 TABLET BY MOUTH EVERY DAY 03/19/20  Yes Orma Flaming, MD  gabapentin (NEURONTIN) 100 MG capsule TAKE 1 CAPSULE BY MOUTH AT BEDTIME. 05/23/20  Yes Orma Flaming, MD  metFORMIN (GLUCOPHAGE) 500 MG tablet Take 1 tablet (500 mg total) by mouth 2 (two) times daily with a meal. 07/07/19  Yes Orma Flaming, MD  metoprolol succinate (TOPROL-XL) 100 MG 24 hr tablet TAKE 1 TABLET BY MOUTH EVERY EVENING WITH OR IMMEDIATELY FOLLOWING A MEAL 12/01/19  Yes Orma Flaming, MD  montelukast (SINGULAIR) 10 MG tablet TAKE 1 TABLET BY MOUTH EVERYDAY AT BEDTIME 10/04/19  Yes Orma Flaming, MD  pantoprazole (PROTONIX) 40 MG tablet TAKE 1 TABLET BY MOUTH TWICE A DAY 06/27/20  Yes Orma Flaming, MD  rosuvastatin (CRESTOR) 10 MG tablet Take 1 tablet (10 mg total) by mouth daily. 07/06/19  Yes Orma Flaming, MD  valsartan (DIOVAN) 320 MG tablet TAKE 1 TABLET BY MOUTH EVERY DAY 02/10/20  Yes Orma Flaming, MD  venlafaxine XR (EFFEXOR-XR) 150 MG 24 hr capsule TAKE 1 CAPSULE BY MOUTH EVERY MORNING 06/27/20  Yes Orma Flaming, MD  clonazePAM (KLONOPIN) 0.5 MG tablet TAKE 1 TABLET BY MOUTH TWICE A DAY AS NEEDED FOR ANXIETY 04/30/20   Orma Flaming, MD  econazole nitrate 1 % cream APPLY A SMALL AMOUNT TO AFFECTED AREA ONCE A DAY 05/09/20   [provider]  enoxaparin (LOVENOX) 40 MG/0.4ML injection Inject 0.4 mLs (40 mg total) into the skin daily for 7 days. Recommend starting the day after surgery. 05/01/20 05/08/20  Scheeler, Carola Rhine, PA-C  griseofulvin (GRIFULVIN V) 500 MG tablet Take 500 mg by mouth daily. Patient not taking: Reported on 07/02/2020 04/30/20   [provider]  naproxen  (NAPROSYN) 500 MG tablet TAKE 1 TABLET TWICE A DAY WITH A MEAL 04/30/20   Orma Flaming, MD  octreotide (SANDOSTATIN LAR) 30 MG injection Inject 30 mg into the muscle every 28 (twenty-eight) days.    [provider]  ondansetron (ZOFRAN) 4 MG tablet Take 1 tablet (4 mg total) by mouth every 8 (eight) hours as needed for nausea or vomiting. 05/01/20   Scheeler, Carola Rhine, PA-C  PROAIR HFA 108 (90 Base) MCG/ACT inhaler INHALE 2 PUFFS INTO THE LUNGS EVERY 6 (SIX) HOURS  AS NEEDED. FOR SHORTNESS OF BREATH. Patient taking differently: Inhale 2 puffs into the lungs every 6 (six) hours as needed for wheezing or shortness of breath. 12/24/15   [name], NP  Semaglutide, 1 MG/DOSE, (OZEMPIC, 1 MG/DOSE,) 4 MG/3ML SOPN Inject 1 mg into the skin once a week. 05/30/20   Briscoe Deutscher, DO  valACYclovir (VALTREX) 1000 MG tablet Take by mouth. 08/23/19   [provider]    Family History Family History  Problem Relation Age of Onset  . Arthritis Maternal Grandmother   . Arthritis Maternal Grandfather   . Arthritis Paternal Grandmother   . Arthritis Paternal Grandfather   . Heart failure Paternal Grandfather   . Breast cancer Mother 25  . Lupus Mother   . Heart failure Father   . Hypertension Father   . Hyperlipidemia Father   . Sudden death Father   . Obesity Father   . Breast cancer Maternal Aunt 38  . Ovarian cancer Maternal Aunt 60  . Breast cancer Maternal Aunt 7    Social History Social History   Tobacco Use  . Smoking status: Never Smoker  . Smokeless tobacco: Never Used  . Tobacco comment: never used tobacco  Vaping Use  . Vaping Use: Never used  Substance Use Topics  . Alcohol use: No    Alcohol/week: 0.0 standard drinks  . Drug use: No     Allergies   Patient has no known allergies.   Review of Systems Review of Systems  Constitutional: Negative.   HENT: Negative.   Eyes: Negative.   Respiratory: Negative.   Cardiovascular:       Chest wall  pain x3 days  Genitourinary: Negative.   Musculoskeletal: Negative.   Skin: Negative.   Neurological: Negative.      Physical Exam Triage Vital Signs ED Triage Vitals  Enc Vitals Group     BP --      Pulse Rate 07/02/20 1743 99     Resp 07/02/20 1743 18     Temp 07/02/20 1743 99.4 F (37.4 C)     Temp Source 07/02/20 1743 Oral     SpO2 07/02/20 1743 98 %     Weight --      Height --      Head Circumference --      Peak Flow --      Pain Score 07/02/20 1744 5     Pain Loc --      Pain Edu? --      Excl. in [place]? --    No data found.  Updated Vital Signs Pulse 99   Temp 99.4 F (37.4 C) (Oral)   Resp 18   SpO2 98%   Physical Exam Vitals and nursing note reviewed.  Constitutional:      General: She is not in acute distress.    Appearance: Normal appearance. She is obese. She is not ill-appearing.  HENT:     Head: Normocephalic and atraumatic.     Mouth/Throat:     Mouth: Mucous membranes are moist.     Pharynx: Oropharynx is clear.  Eyes:     Extraocular Movements: Extraocular movements intact.     Conjunctiva/sclera: Conjunctivae normal.     Pupils: Pupils are equal, round, and reactive to light.  Cardiovascular:     Rate and Rhythm: Normal rate and regular rhythm.     Pulses: Normal pulses.     Heart sounds: Normal heart sounds.  Pulmonary:     Effort: Pulmonary effort  is normal. No respiratory distress.     Breath sounds: Normal breath sounds. No wheezing, rhonchi or rales.     Comments: TTP over anterior superior left-sided ribs, no deformity noted Chest:     Chest wall: Tenderness present.  Musculoskeletal:        General: Normal range of motion.     Cervical back: Normal range of motion and neck supple. No tenderness.  Lymphadenopathy:     Cervical: No cervical adenopathy.  Skin:    General: Skin is warm and dry.  Neurological:     General: No focal deficit present.     Mental Status: She is alert and oriented to person, place, and time.   Psychiatric:        Mood and Affect: Mood normal.        Behavior: Behavior normal.      UC Treatments / Results  Labs (all labs ordered are listed, but only abnormal results are displayed) Labs Reviewed - No data to display  EKG   Radiology DG Ribs Unilateral W/Chest Left  Result Date: 07/02/2020 CLINICAL DATA:  60 year old female with cough and left chest wall pain. EXAM: LEFT RIBS AND CHEST - 3+ VIEW COMPARISON:  Chest radiograph dated 09/10/2015 FINDINGS: No focal consolidation, pleural effusion, pneumothorax. Mild cardiomegaly. Atherosclerotic calcification of the aortic arch. No acute osseous pathology. No displaced rib fractures. Multiple surgical clips noted over the bilateral chest walls. There is a 6.5 cm calcific density in the left breast corresponding to calcification seen on the prior CT. IMPRESSION: 1. No acute cardiopulmonary process. 2. No displaced rib fractures. Electronically Signed   By: Anner Crete M.D.   On: 07/02/2020 18:29    Procedures Procedures (including critical care time)  Medications Ordered in UC Medications - No data to display  Initial Impression / Assessment and Plan / UC Course  I have reviewed the triage vital signs and the nursing notes.  Pertinent labs & imaging results that were available during my care of the patient were reviewed by me and considered in my medical decision making (see chart for details).     MDM: 1.  Costochondritis, 2.  Upper back pain.  Patient discharged home, hemodynamically stable Final Clinical Impressions(s) / UC Diagnoses   Final diagnoses:  Costochondritis  Pain, upper back     Discharge Instructions     Advised patient may use 600-800 mg of Ibuprofen 1-2 times daily, as needed advised/encouraged patient may use) daily, as needed for muscular skeletal pain/tension of affected areas of upper chest and upper back.  Advised patient may use Baclofen daily, as needed    ED Prescriptions     Medication Sig Dispense Auth. Provider   baclofen (LIORESAL) 10 MG tablet Take 1 tablet (10 mg total) by mouth 3 (three) times daily. 66 each Eliezer Lofts, FNP     PDMP not reviewed this encounter.   Eliezer Lofts, [place] 07/02/20 1857

## 2020-07-02 NOTE — Telephone Encounter (Signed)
Patient called to advise that she is still in pain from her surgery on 03/28. The left side,near her ribs, where the muscle was stretched is where she is having the pain and it takes her breath away. She is currently only taking aleve. Please call to advise what else she can do for pain relief.

## 2020-07-02 NOTE — Discharge Instructions (Addendum)
Advised patient may use 600-800 mg of Ibuprofen 1-2 times daily, as needed advised/encouraged patient may use) daily, as needed for muscular skeletal pain/tension of affected areas of upper chest and upper back.  Advised patient may use Baclofen daily, as needed

## 2020-07-02 NOTE — Telephone Encounter (Signed)
Called and spoke with the patient regarding the message below.  Informed the patient that I spoke with Dr. Claudia Desanctis, and she can try alternating Tylenol and Ibuprofen.  Patient verbalized understanding and agreed.//AB/CMA

## 2020-07-04 ENCOUNTER — Ambulatory Visit (INDEPENDENT_AMBULATORY_CARE_PROVIDER_SITE_OTHER): Payer: 59 | Admitting: Family Medicine

## 2020-07-04 ENCOUNTER — Other Ambulatory Visit: Payer: Self-pay

## 2020-07-04 ENCOUNTER — Encounter (INDEPENDENT_AMBULATORY_CARE_PROVIDER_SITE_OTHER): Payer: Self-pay | Admitting: Family Medicine

## 2020-07-04 VITALS — BP 142/91 | HR 88 | Temp 98.3°F | Ht 59.0 in | Wt 203.0 lb

## 2020-07-04 DIAGNOSIS — F3289 Other specified depressive episodes: Secondary | ICD-10-CM

## 2020-07-04 DIAGNOSIS — E1169 Type 2 diabetes mellitus with other specified complication: Secondary | ICD-10-CM | POA: Diagnosis not present

## 2020-07-04 DIAGNOSIS — F32A Depression, unspecified: Secondary | ICD-10-CM | POA: Insufficient documentation

## 2020-07-04 DIAGNOSIS — Z9189 Other specified personal risk factors, not elsewhere classified: Secondary | ICD-10-CM | POA: Diagnosis not present

## 2020-07-04 DIAGNOSIS — Z6841 Body Mass Index (BMI) 40.0 and over, adult: Secondary | ICD-10-CM | POA: Diagnosis not present

## 2020-07-04 MED ORDER — OZEMPIC (1 MG/DOSE) 4 MG/3ML ~~LOC~~ SOPN: 1.0000 mg | PEN_INJECTOR | SUBCUTANEOUS | 0 refills | Status: DC

## 2020-07-11 NOTE — Progress Notes (Signed)
Chief Complaint:   OBESITY Leah [name] is here to discuss her progress with her obesity treatment plan along with follow-up of her obesity related diagnoses.   Today's visit was #: 15 Starting weight: 226 lbs Starting date: 07/07/2019 Today's weight: 203 lbs Today's date: 07/04/2020 Weight change since last visit: 0 Total lbs lost to date: 23 lbs Body mass index is 41 kg/m.  Total weight loss percentage to date: -10.18%  Interim History:  This is Leah [name]'s first office visit with me.  Prior to this, she saw Dr. Juleen China.  She skipped some foods/meals over the last couple of weeks and did some off plan eating due to GI upset secondary to medications given to her by her PCP.  Current Meal Plan: the Category 2 Plan for 90% of the time.  Current Exercise Plan: None. Current Anti-Obesity Medications: Ozempic 1 mg subcutaneously weekly. Side effects: None.  Assessment/Plan:   Medications Discontinued During This Encounter  Medication Reason   Semaglutide, 1 MG/DOSE, (OZEMPIC, 1 MG/DOSE,) 4 MG/3ML SOPN Reorder    Meds ordered this encounter  Medications   Semaglutide, 1 MG/DOSE, (OZEMPIC, 1 MG/DOSE,) 4 MG/3ML SOPN    Sig: Inject 1 mg into the skin once a week.    Dispense:  3 mL    Refill:  0    1. Type 2 diabetes mellitus with other specified complication, without long-term current use of insulin (HCC) Diabetes Mellitus: Not at goal. Medication: Ozempic 1 mg subcutaneously weekly and metformin 500 mg twice daily. Issues reviewed: blood sugar goals, complications of diabetes mellitus, hypoglycemia prevention and treatment, exercise, and nutrition.  Ozempic helps with cravings and helps her feel fuller sooner.  Not checking blood sugars much, but when she did, it was within normal limits in general.  Plan:  Refill Ozempic today, as per below.  The importance of regular follow up with PCP and all other specialists as scheduled was stressed to patient today. The patient will continue to  focus on protein-rich, low simple carbohydrate foods. We reviewed the importance of hydration, regular exercise for stress reduction, and restorative sleep.   Lab Results  Component Value Date   HGBA1C 6.4 (H) 03/05/2020   HGBA1C 6.8 (H) 11/02/2019   HGBA1C 7.3 (H) 07/06/2019   Lab Results  Component Value Date   MICROALBUR 0.9 07/06/2019   LDLCALC 63 11/02/2019   CREATININE 0.82 05/21/2020   - Refill Semaglutide, 1 MG/DOSE, (OZEMPIC, 1 MG/DOSE,) 4 MG/3ML SOPN; Inject 1 mg into the skin once a week.  Dispense: 3 mL; Refill: 0  2. Other depression, with emotional eating Controlled. Medication: Effexor-XR 150 mg daily.  Plan:  Behavior modification techniques were discussed today to help deal with emotional/non-hunger eating behaviors.  3. At risk for dehydration Leah [name] is at higher than average risk of dehydration due to currently drinking 60 ounces of water per day or so.  Leah [name] was given more than 9 minutes of proper hydration counseling today.  We discussed the signs and symptoms of dehydration, some of which may include muscle cramping, constipation or even orthostatic symptoms.  Counseling on the prevention of dehydration was also provided today.  Leah [name] is at risk for dehydration due to weight loss, lifestyle and behavorial habits and possibly due to taking certain medication(s).  She was encouraged to adequately hydrate and monitor fluid status to avoid dehydration as well as weight loss plateaus.  Unless pre-existing renal or cardiopulmonary conditions exist, in which patient was told to limit their fluid intake, I  recommended roughly one half of their weight in pounds to be the approximate ounces of non-caloric, non-caffeinated beverages they should drink per day; including more if they are engaging in exercise.  4. Obesity, current BMI 41.2  Course: Leah [name] is currently in the action stage of change. As such, her goal is to continue with weight loss efforts.   Nutrition goals:  She has agreed to the Category 2 Plan.   Exercise goals:  Walking for 5-10 minutes per day.  Behavioral modification strategies: meal planning and cooking strategies, keeping healthy foods in the home and avoiding temptations.  Leah [name] has agreed to follow-up with our clinic in 3-4 weeks with Dr. Juleen China. She was informed of the importance of frequent follow-up visits to maximize her success with intensive lifestyle modifications for her multiple health conditions.   Objective:   Blood pressure (!) 142/91, pulse 88, temperature 98.3 F (36.8 C), height 4\' 11"  (1.499 m), weight 203 lb (92.1 kg), SpO2 98 %. Body mass index is 41 kg/m.  General: Cooperative, alert, well developed, in no acute distress. HEENT: Conjunctivae and lids unremarkable. Cardiovascular: Regular rhythm.  Lungs: Normal work of breathing. Neurologic: No focal deficits.   Lab Results  Component Value Date   CREATININE 0.82 05/21/2020   BUN 13 05/21/2020   NA 139 05/21/2020   K 4.1 05/21/2020   CL 102 05/21/2020   CO2 27 05/21/2020   Lab Results  Component Value Date   ALT 24 05/21/2020   AST 19 05/21/2020   ALKPHOS 75 05/21/2020   BILITOT 0.5 05/21/2020   Lab Results  Component Value Date   HGBA1C 6.4 (H) 03/05/2020   HGBA1C 6.8 (H) 11/02/2019   HGBA1C 7.3 (H) 07/06/2019   HGBA1C 7.1 (H) 12/24/2018   HGBA1C 8.1 (A) 08/31/2018   Lab Results  Component Value Date   TSH 2.720 03/05/2020   Lab Results  Component Value Date   CHOL 140 11/02/2019   HDL 58 11/02/2019   LDLCALC 63 11/02/2019   TRIG 111 11/02/2019   CHOLHDL 2.4 11/02/2019   Lab Results  Component Value Date   WBC 11.1 (H) 05/21/2020   HGB 12.4 05/21/2020   HCT 37.3 05/21/2020   MCV 94.7 05/21/2020   PLT 338 05/21/2020   Lab Results  Component Value Date   IRON <10 (L) 10/18/2008   TIBC NOT CALC Not calculated due to Iron <10. 10/18/2008   FERRITIN 382 (H) 10/18/2008   Attestation Statements:   Reviewed by clinician on  day of visit: allergies, medications, problem list, medical history, surgical history, family history, social history, and previous encounter notes.  I, Water quality scientist, CMA, am acting as Location manager for [employer], DO.  I have reviewed the above documentation for accuracy and completeness, and I agree with the above. Marjory Sneddon, D.O.  The [place] was signed into law in 2016 which includes the topic of electronic health records.  This provides immediate access to information in MyChart.  This includes consultation notes, operative notes, office notes, lab results and pathology reports.  If you have any questions about what you read please let us know at your next visit so we can discuss your concerns and take corrective action if need be.  We are right here with you.

## 2020-07-16 ENCOUNTER — Other Ambulatory Visit: Payer: Self-pay

## 2020-07-16 ENCOUNTER — Inpatient Hospital Stay: Payer: 59 | Attending: Hematology & Oncology

## 2020-07-16 VITALS — BP 135/106 | HR 86 | Temp 98.6°F | Resp 18

## 2020-07-16 DIAGNOSIS — C7B02 Secondary carcinoid tumors of liver: Secondary | ICD-10-CM | POA: Diagnosis not present

## 2020-07-16 DIAGNOSIS — C7B8 Other secondary neuroendocrine tumors: Secondary | ICD-10-CM

## 2020-07-16 DIAGNOSIS — C7A00 Malignant carcinoid tumor of unspecified site: Secondary | ICD-10-CM | POA: Insufficient documentation

## 2020-07-16 MED ORDER — LANREOTIDE ACETATE 120 MG/0.5ML ~~LOC~~ SOLN
120.0000 mg | Freq: Once | SUBCUTANEOUS | Status: AC
  Administered 2020-07-16: 120 mg via SUBCUTANEOUS

## 2020-07-16 NOTE — Patient Instructions (Signed)
Lanreotide injection What is this medicine? LANREOTIDE (lan REE oh tide) is used to reduce blood levels of growth hormone in patients with a condition called acromegaly. It also works to slow or stop tumor growth in patients with neuroendocrine tumors and treat carcinoid syndrome. This medicine may be used for other purposes; ask your health care provider or pharmacist if you have questions. COMMON BRAND NAME(S): Somatuline [place] What should I tell my health care provider before I take this medicine? They need to know if you have any of these conditions:  diabetes  gallbladder disease  heart disease  kidney disease  liver disease  thyroid disease  an unusual or allergic reaction to lanreotide, other medicines, foods, dyes, or preservatives  pregnant or trying to get pregnant  breast-feeding How should I use this medicine? This medicine is for injection under the skin. It is given by a health care professional in a hospital or clinic setting. Contact your pediatrician or health care professional regarding the use of this medicine in children. Special care may be needed. Overdosage: If you think you have taken too much of this medicine contact a poison control center or emergency room at once. NOTE: This medicine is only for you. Do not share this medicine with others. What if I miss a dose? It is important not to miss your dose. Call your doctor or health care professional if you are unable to keep an appointment. What may interact with this medicine? This medicine may interact with the following medications:  bromocriptine  cyclosporine  certain medicines for blood pressure, heart disease, irregular heart beat  certain medicines for diabetes  quinidine  terfenadine This list may not describe all possible interactions. Give your health care provider a list of all the medicines, herbs, non-prescription drugs, or dietary supplements you use. Also tell them if you smoke,  drink alcohol, or use illegal drugs. Some items may interact with your medicine. What should I watch for while using this medicine? Tell your doctor or healthcare professional if your symptoms do not start to get better or if they get worse. Visit your doctor or health care professional for regular checks on your progress. Your condition will be monitored carefully while you are receiving this medicine. This medicine may increase blood sugar. Ask your healthcare provider if changes in diet or medicines are needed if you have diabetes. You may need blood work done while you are taking this medicine. Women should inform their doctor if they wish to become pregnant or think they might be pregnant. There is a potential for serious side effects to an unborn child. Talk to your health care professional or pharmacist for more information. Do not breast-feed an infant while taking this medicine or for 6 months after stopping it. This medicine has caused ovarian failure in some women. This medicine may interfere with the ability to have a child. Talk with your doctor or health care professional if you are concerned about your fertility. What side effects may I notice from receiving this medicine? Side effects that you should report to your doctor or health care professional as soon as possible:  allergic reactions like skin rash, itching or hives, swelling of the face, lips, or tongue  increased blood pressure  severe stomach pain  signs and symptoms of hgh blood sugar such as being more thirsty or hungry or having to urinate more than normal. You may also feel very tired or have blurry vision.  signs and symptoms of low blood   sugar such as feeling anxious; confusion; dizziness; increased hunger; unusually weak or tired; sweating; shakiness; cold; irritable; headache; blurred vision; fast heartbeat; loss of consciousness  unusually slow heartbeat Side effects that usually do not require medical  attention (report to your doctor or health care professional if they continue or are bothersome):  constipation  diarrhea  dizziness  headache  muscle pain  muscle spasms  nausea  pain, redness, or irritation at site where injected This list may not describe all possible side effects. Call your doctor for medical advice about side effects. You may report side effects to FDA at 1-800-FDA-1088. Where should I keep my medicine? This drug is given in a hospital or clinic and will not be stored at home. NOTE: This sheet is a summary. It may not cover all possible information. If you have questions about this medicine, talk to your doctor, pharmacist, or health care provider.  2021 Elsevier/Gold Standard (2017-11-05 09:13:08)  

## 2020-07-17 ENCOUNTER — Other Ambulatory Visit: Payer: Self-pay

## 2020-07-17 ENCOUNTER — Other Ambulatory Visit: Payer: Self-pay | Admitting: Family Medicine

## 2020-07-17 DIAGNOSIS — M4722 Other spondylosis with radiculopathy, cervical region: Secondary | ICD-10-CM

## 2020-07-19 ENCOUNTER — Other Ambulatory Visit: Payer: Self-pay

## 2020-07-19 ENCOUNTER — Ambulatory Visit (INDEPENDENT_AMBULATORY_CARE_PROVIDER_SITE_OTHER): Payer: 59

## 2020-07-19 VITALS — BP 130/92 | HR 85 | Temp 98.2°F | Ht 59.0 in | Wt 200.0 lb

## 2020-07-19 DIAGNOSIS — Z9013 Acquired absence of bilateral breasts and nipples: Secondary | ICD-10-CM

## 2020-07-24 ENCOUNTER — Other Ambulatory Visit: Payer: Self-pay | Admitting: Family Medicine

## 2020-07-24 ENCOUNTER — Encounter (INDEPENDENT_AMBULATORY_CARE_PROVIDER_SITE_OTHER): Payer: Self-pay

## 2020-07-24 ENCOUNTER — Ambulatory Visit (INDEPENDENT_AMBULATORY_CARE_PROVIDER_SITE_OTHER): Payer: 59 | Admitting: Family Medicine

## 2020-07-25 ENCOUNTER — Ambulatory Visit (INDEPENDENT_AMBULATORY_CARE_PROVIDER_SITE_OTHER): Payer: 59 | Admitting: Family Medicine

## 2020-07-27 ENCOUNTER — Other Ambulatory Visit: Payer: Self-pay | Admitting: Family Medicine

## 2020-07-30 ENCOUNTER — Other Ambulatory Visit: Payer: Self-pay

## 2020-07-30 ENCOUNTER — Telehealth: Payer: Self-pay | Admitting: Family Medicine

## 2020-07-30 ENCOUNTER — Other Ambulatory Visit: Payer: Self-pay | Admitting: Surgical

## 2020-07-30 NOTE — Telephone Encounter (Signed)
  LAST APPOINTMENT DATE: 03/09/2020   NEXT APPOINTMENT DATE:10/10/2020 -TOC to Alyssa   MEDICATION: ondansetron (ZOFRAN) 4 MG tablet  PHARMACY: [id number] [place], [place] [address]  Comments: Patient is requesting for [place] to refill medication as she doesn't see the plastic surgeon any more.   Please advise

## 2020-08-01 ENCOUNTER — Other Ambulatory Visit: Payer: Self-pay | Admitting: Family Medicine

## 2020-08-01 NOTE — Telephone Encounter (Signed)
[facility], they are requesting a prescription refill. [facility]vised [facility] that we are not the one who originally prescribed this and was awaiting a response from our Nurse Advisor to see how we need to proceed with this.

## 2020-08-02 NOTE — Telephone Encounter (Signed)
Medication was prescribed for surgery. She would need an appointment for refills.

## 2020-08-03 NOTE — Telephone Encounter (Signed)
LVM to call back to get scheduled for an appointment.

## 2020-08-05 NOTE — Patient Instructions (Signed)
Review of after care instructions. Patient will use vaseline/xeroform & gauze dressing until areas are healed. She is aware that the colors/shades may fade & this could require  touch-up sessions. She will call for any concerns.

## 2020-08-05 NOTE — Progress Notes (Signed)
NIPPLE AREOLAR TATTOO PROCEDURE  PREOPERATIVE DIAGNOSIS:  Acquired absence of BILATERAL nipple areolar   POSTOPERATIVE DIAGNOSIS: Acquired absence of BILATERAL nipple areolar    PROCEDURES: BILATERAL nipple areolar tattoo   ATTENDING SURGEON: Dr. Claudia Desanctis  ANESTHESIA:  EMLA  COMPLICATIONS: None.  JUSTIFICATION FOR PROCEDURE:  Ms. Blaydes is a 60 y.o. female with a history of breast cancer status post breast reconstruction.  The patient presents for  bilateral nipple areolar complex tattoo.  Risks, benefits, indications, and alternatives of the above described procedures were discussed with the patient and all the patient's questions were answered.   DESCRIPTION OF PROCEDURE: After informed consent was obtained and proper identification of patient and surgical site was made, the patient was taken to the procedure room and  pre-procedure photos taken & entered into chart. Placement/size & colors chosen by patient. Patient was then placed supine on the operating room table. A time out was performed to confirm patient's identity and surgical site. The patient was prepped and draped in the usual sterile fashion.   Using a #7  tattoo head, pigment was instilled to the designed nipple areolar complex. Once adequate pigment had been applied to the nipple areolar complex, a  post-procedure photo taken. Vaseline/xeroform & gauze dressing was applied. The patient tolerated the procedure well.  Reviewed after care instructions. [employer] Tattoo Ink used:  [employer], [employer], [place], [place] Duration topical anesthesia as needed

## 2020-08-06 ENCOUNTER — Other Ambulatory Visit: Payer: Self-pay

## 2020-08-06 ENCOUNTER — Encounter: Payer: Self-pay | Admitting: Family Medicine

## 2020-08-06 MED ORDER — ONDANSETRON HCL 4 MG PO TABS
4.0000 mg | ORAL_TABLET | Freq: Three times a day (TID) | ORAL | 0 refills | Status: DC | PRN
Start: 2020-08-06 — End: 2020-08-08

## 2020-08-06 NOTE — Telephone Encounter (Signed)
Pt is requesting Zofran 4 mg dissolving tab. LOV: 03/09/2020 10/10/2020- TOC Alyssa Last refill by Roetta Sessions 05/01/2020  Approve?

## 2020-08-06 NOTE — Telephone Encounter (Signed)
[facility]

## 2020-08-07 ENCOUNTER — Other Ambulatory Visit: Payer: Self-pay

## 2020-08-07 ENCOUNTER — Ambulatory Visit (INDEPENDENT_AMBULATORY_CARE_PROVIDER_SITE_OTHER): Payer: 59 | Admitting: Family Medicine

## 2020-08-07 ENCOUNTER — Encounter (INDEPENDENT_AMBULATORY_CARE_PROVIDER_SITE_OTHER): Payer: Self-pay | Admitting: Family Medicine

## 2020-08-07 VITALS — BP 110/76 | HR 120 | Temp 98.2°F | Ht 59.0 in | Wt 200.0 lb

## 2020-08-07 DIAGNOSIS — M25562 Pain in left knee: Secondary | ICD-10-CM

## 2020-08-07 DIAGNOSIS — E1169 Type 2 diabetes mellitus with other specified complication: Secondary | ICD-10-CM

## 2020-08-07 DIAGNOSIS — I152 Hypertension secondary to endocrine disorders: Secondary | ICD-10-CM

## 2020-08-07 DIAGNOSIS — Z9189 Other specified personal risk factors, not elsewhere classified: Secondary | ICD-10-CM | POA: Diagnosis not present

## 2020-08-07 DIAGNOSIS — E1159 Type 2 diabetes mellitus with other circulatory complications: Secondary | ICD-10-CM | POA: Diagnosis not present

## 2020-08-07 DIAGNOSIS — Z6841 Body Mass Index (BMI) 40.0 and over, adult: Secondary | ICD-10-CM

## 2020-08-07 DIAGNOSIS — M25561 Pain in right knee: Secondary | ICD-10-CM | POA: Diagnosis not present

## 2020-08-07 DIAGNOSIS — G8929 Other chronic pain: Secondary | ICD-10-CM

## 2020-08-08 ENCOUNTER — Other Ambulatory Visit: Payer: Self-pay

## 2020-08-08 MED ORDER — ONDANSETRON 4 MG PO TBDP: 4.0000 mg | ORAL_TABLET | Freq: Three times a day (TID) | ORAL | 0 refills | Status: DC | PRN

## 2020-08-14 ENCOUNTER — Telehealth: Payer: Self-pay

## 2020-08-14 NOTE — Telephone Encounter (Signed)
Pharmacist Name [place] Number [phone] Translation No Disp. Time Eilene Ghazi Time) Disposition Final User 08/10/2020 10:03:21 PM Clinical Call Yes Donna Christen, RN, Le[facility] Como Comments User: Tresa Moore, RN Date/Time Eilene Ghazi Time): 08/10/2020 10:03:10 PM Attempted to call pharmacy back but are closed already

## 2020-08-15 ENCOUNTER — Inpatient Hospital Stay: Payer: 59 | Attending: Hematology & Oncology

## 2020-08-15 ENCOUNTER — Inpatient Hospital Stay: Payer: 59

## 2020-08-15 ENCOUNTER — Telehealth: Payer: Self-pay

## 2020-08-15 ENCOUNTER — Inpatient Hospital Stay (HOSPITAL_BASED_OUTPATIENT_CLINIC_OR_DEPARTMENT_OTHER): Payer: 59 | Admitting: Hematology & Oncology

## 2020-08-15 ENCOUNTER — Encounter: Payer: Self-pay | Admitting: Hematology & Oncology

## 2020-08-15 ENCOUNTER — Other Ambulatory Visit: Payer: Self-pay

## 2020-08-15 VITALS — BP 152/93 | HR 122 | Temp 98.2°F | Resp 19 | Wt 203.0 lb

## 2020-08-15 DIAGNOSIS — C7B02 Secondary carcinoid tumors of liver: Secondary | ICD-10-CM | POA: Insufficient documentation

## 2020-08-15 DIAGNOSIS — C7A00 Malignant carcinoid tumor of unspecified site: Secondary | ICD-10-CM | POA: Insufficient documentation

## 2020-08-15 DIAGNOSIS — C7B8 Other secondary neuroendocrine tumors: Secondary | ICD-10-CM

## 2020-08-15 LAB — CMP (CANCER CENTER ONLY)
ALT: 19 U/L (ref 0–44)
AST: 18 U/L (ref 15–41)
Albumin: 4.2 g/dL (ref 3.5–5.0)
Alkaline Phosphatase: 93 U/L (ref 38–126)
Anion gap: 8 (ref 5–15)
BUN: 17 mg/dL (ref 8–23)
CO2: 27 mmol/L (ref 22–32)
Calcium: 9.6 mg/dL (ref 8.9–10.3)
Chloride: 104 mmol/L (ref 98–111)
Creatinine: 0.71 mg/dL (ref 0.44–1.00)
GFR, Estimated: >60 mL/min (ref >60)
Glucose, Bld: 123 mg/dL — ABNORMAL HIGH (ref 70–99)
Potassium: 4 mmol/L (ref 3.5–5.1)
Sodium: 139 mmol/L (ref 135–145)
Total Bilirubin: 0.4 mg/dL (ref 0.3–1.2)
Total Protein: 7.1 g/dL (ref 6.5–8.1)

## 2020-08-15 LAB — CBC WITH DIFFERENTIAL (CANCER CENTER ONLY)
Abs Immature Granulocytes: 0.06 10*3/uL (ref 0.00–0.07)
Basophils Absolute: 0.1 10*3/uL (ref 0.0–0.1)
Basophils Relative: 0 %
Eosinophils Absolute: 0.3 10*3/uL (ref 0.0–0.5)
Eosinophils Relative: 2 %
HCT: 41.3 % (ref 36.0–46.0)
Hemoglobin: 13.3 g/dL (ref 12.0–15.0)
Immature Granulocytes: 0 %
Lymphocytes Relative: 24 %
Lymphs Abs: 3.5 10*3/uL (ref 0.7–4.0)
MCH: 29.9 pg (ref 26.0–34.0)
MCHC: 32.2 g/dL (ref 30.0–36.0)
MCV: 92.8 fL (ref 80.0–100.0)
Monocytes Absolute: 1 10*3/uL (ref 0.1–1.0)
Monocytes Relative: 7 %
Neutro Abs: 9.8 10*3/uL — ABNORMAL HIGH (ref 1.7–7.7)
Neutrophils Relative %: 67 %
Platelet Count: 290 10*3/uL (ref 150–400)
RBC: 4.45 MIL/uL (ref 3.87–5.11)
RDW: 14.6 % (ref 11.5–15.5)
WBC Count: 14.6 10*3/uL — ABNORMAL HIGH (ref 4.0–10.5)
nRBC: 0 % (ref 0.0–0.2)

## 2020-08-15 LAB — LACTATE DEHYDROGENASE: LDH: 194 U/L — ABNORMAL HIGH (ref 98–192)

## 2020-08-15 MED ORDER — LANREOTIDE ACETATE 120 MG/0.5ML ~~LOC~~ SOLN
SUBCUTANEOUS | Status: AC
  Filled 2020-08-15: qty 120

## 2020-08-15 MED ORDER — ONDANSETRON 4 MG PO TBDP: 4.0000 mg | ORAL_TABLET | Freq: Three times a day (TID) | ORAL | 0 refills | Status: DC | PRN

## 2020-08-15 MED ORDER — LANREOTIDE ACETATE 120 MG/0.5ML ~~LOC~~ SOLN
120.0000 mg | Freq: Once | SUBCUTANEOUS | Status: AC
Start: 2020-08-15 — End: 2020-08-15
  Administered 2020-08-15: 120 mg via SUBCUTANEOUS

## 2020-08-15 NOTE — Patient Instructions (Signed)
Lanreotide injection What is this medication? LANREOTIDE (lan REE oh tide) is used to reduce blood levels of growth hormone in patients with a condition called acromegaly. It also works to slow or stop tumor growth in patients with neuroendocrine tumors and treat carcinoid syndrome. This medicine may be used for other purposes; ask your health care provider or pharmacist if you have questions. COMMON BRAND NAME(S): Somatuline [place] What should I tell my care team before I take this medication? They need to know if you have any of these conditions: diabetes gallbladder disease heart disease kidney disease liver disease thyroid disease an unusual or allergic reaction to lanreotide, other medicines, foods, dyes, or preservatives pregnant or trying to get pregnant breast-feeding How should I use this medication? This medicine is for injection under the skin. It is given by a health care professional in a hospital or clinic setting. Contact your pediatrician or health care professional regarding the use of this medicine in children. Special care may be needed. Overdosage: If you think you have taken too much of this medicine contact a poison control center or emergency room at once. NOTE: This medicine is only for you. Do not share this medicine with others. What if I miss a dose? It is important not to miss your dose. Call your doctor or health care professional if you are unable to keep an appointment. What may interact with this medication? This medicine may interact with the following medications: bromocriptine cyclosporine certain medicines for blood pressure, heart disease, irregular heart beat certain medicines for diabetes quinidine terfenadine This list may not describe all possible interactions. Give your health care provider a list of all the medicines, herbs, non-prescription drugs, or dietary supplements you use. Also tell them if you smoke, drink alcohol, or use illegal drugs.  Some items may interact with your medicine. What should I watch for while using this medication? Tell your doctor or healthcare professional if your symptoms do not start to get better or if they get worse. Visit your doctor or health care professional for regular checks on your progress. Your condition will be monitored carefully while you are receiving this medicine. This medicine may increase blood sugar. Ask your healthcare provider if changes in diet or medicines are needed if you have diabetes. You may need blood work done while you are taking this medicine. Women should inform their doctor if they wish to become pregnant or think they might be pregnant. There is a potential for serious side effects to an unborn child. Talk to your health care professional or pharmacist for more information. Do not breast-feed an infant while taking this medicine or for 6 months after stopping it. This medicine has caused ovarian failure in some women. This medicine may interfere with the ability to have a child. Talk with your doctor or health care professional if you are concerned about your fertility. What side effects may I notice from receiving this medication? Side effects that you should report to your doctor or health care professional as soon as possible: allergic reactions like skin rash, itching or hives, swelling of the face, lips, or tongue increased blood pressure severe stomach pain signs and symptoms of hgh blood sugar such as being more thirsty or hungry or having to urinate more than normal. You may also feel very tired or have blurry vision. signs and symptoms of low blood sugar such as feeling anxious; confusion; dizziness; increased hunger; unusually weak or tired; sweating; shakiness; cold; irritable; headache; blurred vision; fast   heartbeat; loss of consciousness unusually slow heartbeat Side effects that usually do not require medical attention (report to your doctor or health care  professional if they continue or are bothersome): constipation diarrhea dizziness headache muscle pain muscle spasms nausea pain, redness, or irritation at site where injected This list may not describe all possible side effects. Call your doctor for medical advice about side effects. You may report side effects to FDA at 1-800-FDA-1088. Where should I keep my medication? This drug is given in a hospital or clinic and will not be stored at home. NOTE: This sheet is a summary. It may not cover all possible information. If you have questions about this medicine, talk to your doctor, pharmacist, or health care provider.  2022 Elsevier/Gold Standard (2017-11-05 09:13:08)  

## 2020-08-15 NOTE — Telephone Encounter (Signed)
Left pt message on voicemail to call office.

## 2020-08-15 NOTE — Telephone Encounter (Signed)
Left message on voicemail to call office. [facility]

## 2020-08-15 NOTE — Telephone Encounter (Signed)
Called [facility] and spoke to [place], told her Rx for Zofran was sent to wrong pharmacy, sorry. Katie verbalized understanding and will cancel Rx. Told her thank you.

## 2020-08-15 NOTE — Telephone Encounter (Signed)
[facility], they are informing us that they submitted a PA for ondansetron (ZOFRAN-ODT) 4 MG disintegrating tablet

## 2020-08-15 NOTE — Telephone Encounter (Signed)
See other message regarding medication. 

## 2020-08-15 NOTE — Progress Notes (Signed)
Chief Complaint:   OBESITY Leah [name] is here to discuss her progress with her obesity treatment plan along with follow-up of her obesity related diagnoses.   Today's visit was #: 61 Starting weight: 226 lbs Starting date: 07/07/2019 Today's weight: 200 lbs Today's date: 08/07/2020 Weight change since last visit: 203 lbs Total lbs lost to date: 26 lbs Body mass index is 40.4 kg/m.  Total weight loss percentage to date: -11.50%  Interim History: Leah [name] needs BMI under 40 for left knee TKR.  She says she is feeling hopeful.  Happy with progress.  She says she is moving more, but unable to do too much due to knee pain.  Current Meal Plan: the Category 2 Plan for 80% of the time.  Current Exercise Plan: None. Current Anti-Obesity Medications: Ozempic 1 mg subcutaneously weekly. Side effects: None.  Assessment/Plan:   1. Type 2 diabetes mellitus with other specified complication, without long-term current use of insulin (HCC) Diabetes Mellitus: Not at goal. Medication: Ozempic 1 mg subcutaneously weekly, metformin 500 mg twice daily. Issues reviewed: blood sugar goals, complications of diabetes mellitus, hypoglycemia prevention and treatment, exercise, and nutrition.   Plan: The patient will continue to focus on protein-rich, low simple carbohydrate foods. We reviewed the importance of hydration, regular exercise for stress reduction, and restorative sleep.   Lab Results  Component Value Date   HGBA1C 6.4 (H) 03/05/2020   HGBA1C 6.8 (H) 11/02/2019   HGBA1C 7.3 (H) 07/06/2019   Lab Results  Component Value Date   MICROALBUR 0.9 07/06/2019   LDLCALC 63 11/02/2019   CREATININE 0.71 08/15/2020   2. Chronic pain of both knees Leah [name] needs a BMI under 40 in order to have TKR.   Plan:  We will continue to monitor symptoms as they relate to her weight loss journey.  3. Hypertension associated with diabetes (Leah [name]) Elevated today. Medications: Norvasc 10 mg daily, metoprolol 100 mg  daily, valsartan 320 mg daily.   Plan: Avoid buying foods that are: processed, frozen, or prepackaged to avoid excess salt. We will watch for signs of hypotension as she continues lifestyle modifications.  BP Readings from Last 3 Encounters:  08/15/20 (!) 152/93  08/07/20 110/76  07/19/20 (!) 130/92   Lab Results  Component Value Date   CREATININE 0.71 08/15/2020   4. At risk for heart disease Due to Leah [name]'s current state of health and medical condition(s), she is at a higher risk for heart disease.  This puts the patient at much greater risk to subsequently develop cardiopulmonary conditions that can significantly affect patient's quality of life in a negative manner.    At least 8 minutes were spent on counseling Leah [name] about these concerns today. Evidence-based interventions for health behavior change were utilized today including the discussion of self monitoring techniques, problem-solving barriers, and SMART goal setting techniques.  Specifically, regarding patient's less desirable eating habits and patterns, we employed the technique of small changes when Leah [name] has not been able to fully commit to her prudent nutritional plan.  5. Obesity, current BMI 40.5  Course: Leah [name] is currently in the action stage of change. As such, her goal is to continue with weight loss efforts.   Nutrition goals: She has agreed to the Category 2 Plan.   Exercise goals:  As is.  Behavioral modification strategies: increasing lean protein intake, decreasing simple carbohydrates, and increasing vegetables.  Leah [name] has agreed to follow-up with our clinic in 4 weeks. She was informed of the importance of frequent follow-up  visits to maximize her success with intensive lifestyle modifications for her multiple health conditions.   Objective:   Blood pressure 110/76, pulse (!) 120, temperature 98.2 F (36.8 C), temperature source Oral, height 4\' 11"  (1.499 m), weight 200 lb (90.7 kg), SpO2 98 %. Body  mass index is 40.4 kg/m.  General: Cooperative, alert, well developed, in no acute distress. HEENT: Conjunctivae and lids unremarkable. Cardiovascular: Regular rhythm.  Lungs: Normal work of breathing. Neurologic: No focal deficits.   Lab Results  Component Value Date   CREATININE 0.71 08/15/2020   BUN 17 08/15/2020   NA 139 08/15/2020   K 4.0 08/15/2020   CL 104 08/15/2020   CO2 27 08/15/2020   Lab Results  Component Value Date   ALT 19 08/15/2020   AST 18 08/15/2020   ALKPHOS 93 08/15/2020   BILITOT 0.4 08/15/2020   Lab Results  Component Value Date   HGBA1C 6.4 (H) 03/05/2020   HGBA1C 6.8 (H) 11/02/2019   HGBA1C 7.3 (H) 07/06/2019   HGBA1C 7.1 (H) 12/24/2018   HGBA1C 8.1 (A) 08/31/2018   Lab Results  Component Value Date   TSH 2.720 03/05/2020   Lab Results  Component Value Date   CHOL 140 11/02/2019   HDL 58 11/02/2019   LDLCALC 63 11/02/2019   TRIG 111 11/02/2019   CHOLHDL 2.4 11/02/2019   Lab Results  Component Value Date   VD25OH 50 07/22/2012   VD25OH 38 08/20/2010   Lab Results  Component Value Date   WBC 14.6 (H) 08/15/2020   HGB 13.3 08/15/2020   HCT 41.3 08/15/2020   MCV 92.8 08/15/2020   PLT 290 08/15/2020   Lab Results  Component Value Date   IRON <10 (L) 10/18/2008   TIBC NOT CALC Not calculated due to Iron <10. 10/18/2008   FERRITIN 382 (H) 10/18/2008   Attestation Statements:   Reviewed by clinician on day of visit: allergies, medications, problem list, medical history, surgical history, family history, social history, and previous encounter notes.  I, Water quality scientist, CMA, am acting as transcriptionist for Briscoe Deutscher, DO  I have reviewed the above documentation for accuracy and completeness, and I agree with the above. Briscoe Deutscher, DO

## 2020-08-15 NOTE — Progress Notes (Signed)
Hematology and Oncology Follow Up Visit  Leah [name] [id number] [birth year] 60 y.o. 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  Overall, she is doing pretty well.  We last saw her back in April.  She has had no problems since we last saw her.  She had a nice July 4 holiday.  There has been no issues with nausea or vomiting.  She has had no change in bowel or bladder habits.  She does have diarrhea on occasion.  There is been no fever.  She has had no problems with COVID.  She has had no rashes.  Is been no leg swelling..  She is gotten over her breast reduction surgery.  Everything seems to be going quite nicely with this.  Her last Chromogranin A level was 135.  Currently, her performance status is ECOG 0.   Medications:  Allergies as of 08/15/2020   No Known Allergies      Medication List        Accurate as of August 15, 2020  1:25 PM. If you have any questions, ask your nurse or doctor.          amLODipine 10 MG tablet Commonly known as: NORVASC TAKE 1 TABLET BY MOUTH EVERY DAY   aspirin 81 MG EC tablet TAKE 1 TABLET BY MOUTH EVERY DAY *SWALLOW WHOLE*   baclofen 10 MG tablet Commonly known as: LIORESAL Take 1 tablet (10 mg total) by mouth 3 (three) times daily.   clonazePAM 0.5 MG tablet Commonly known as: KLONOPIN TAKE 1 TABLET BY MOUTH TWICE A DAY AS NEEDED FOR ANXIETY   econazole nitrate 1 % cream APPLY A SMALL AMOUNT TO AFFECTED AREA ONCE A DAY   enoxaparin 40 MG/0.4ML injection Commonly known as: LOVENOX Inject 0.4 mLs (40 mg total) into the skin daily for 7 days. Recommend starting the day after surgery.   fexofenadine 180 MG tablet Commonly known as: ALLEGRA TAKE 1 TABLET BY MOUTH EVERY DAY   gabapentin 100  MG capsule Commonly known as: NEURONTIN TAKE 1 CAPSULE BY MOUTH AT BEDTIME.   metFORMIN 500 MG tablet Commonly known as: GLUCOPHAGE Take 1 tablet (500 mg total) by mouth 2 (two) times daily with a meal.   metoprolol succinate 100 MG 24 hr tablet Commonly known as: TOPROL-XL TAKE 1 TABLET BY MOUTH EVERY EVENING WITH OR IMMEDIATELY FOLLOWING A MEAL   montelukast 10 MG tablet Commonly known as: SINGULAIR TAKE 1 TABLET BY MOUTH EVERYDAY AT BEDTIME   naproxen 500 MG tablet Commonly known as: NAPROSYN TAKE 1 TABLET TWICE A DAY WITH A MEAL   octreotide 30 MG injection Commonly known as: SANDOSTATIN LAR Inject 30 mg into the muscle every 28 (twenty-eight) days.   ondansetron 4 MG disintegrating tablet Commonly known as: ZOFRAN-ODT Take 1 tablet (4 mg total) by mouth every 8 (eight) hours as needed.   Ozempic (1 MG/DOSE) 4 MG/3ML Sopn Generic drug: Semaglutide (1 MG/DOSE) Inject 1 mg into the skin once a week.   pantoprazole 40 MG tablet Commonly known as: PROTONIX TAKE 1 TABLET BY MOUTH TWICE A DAY   ProAir HFA 108 (90 Base) MCG/ACT inhaler Generic drug: albuterol INHALE 2 PUFFS INTO THE LUNGS EVERY 6 (SIX) HOURS AS NEEDED. FOR SHORTNESS OF BREATH. What changed: See the new  instructions.   rosuvastatin 10 MG tablet Commonly known as: Crestor Take 1 tablet (10 mg total) by mouth daily.   valACYclovir 1000 MG tablet Commonly known as: VALTREX Take by mouth.   valsartan 320 MG tablet Commonly known as: DIOVAN TAKE 1 TABLET BY MOUTH EVERY DAY   venlafaxine XR 150 MG 24 hr capsule Commonly known as: EFFEXOR-XR TAKE 1 CAPSULE BY MOUTH EVERY MORNING        Allergies: No Known Allergies  Past Medical History, Surgical history, Social history, and Family History were reviewed and updated.  Review of Systems: Review of Systems  Constitutional: Negative.   HENT: Negative.    Eyes: Negative.   Respiratory: Negative.    Cardiovascular: Negative.   Gastrointestinal:  Negative.   Genitourinary: Negative.   Musculoskeletal: Negative.   Skin: Negative.   Neurological: Negative.   Endo/Heme/Allergies: Negative.   Psychiatric/Behavioral: Negative.      Physical Exam:  weight is 203 lb (92.1 kg). Her oral temperature is 98.2 F (36.8 C). Her blood pressure is 152/93 (abnormal) and her pulse is 122 (abnormal). Her respiration is 19.   Wt Readings from Last 3 Encounters:  08/15/20 203 lb (92.1 kg)  08/07/20 200 lb (90.7 kg)  07/19/20 200 lb (90.7 kg)    Physical Exam Vitals reviewed.  HENT:     Head: Normocephalic and atraumatic.  Eyes:     Pupils: Pupils are equal, round, and reactive to light.  Cardiovascular:     Rate and Rhythm: Normal rate and regular rhythm.     Heart sounds: Normal heart sounds.  Pulmonary:     Effort: Pulmonary effort is normal.     Breath sounds: Normal breath sounds.  Abdominal:     General: Bowel sounds are normal.     Palpations: Abdomen is soft.     Comments: Abdominal exam shows an obese abdomen that is soft.  She has good bowel sounds.  There is no fluid wave.  She has laparotomy scars that are well-healed.  There is no tenderness.  There is no guarding or rebound.  There is no palpable liver or spleen tip.  Musculoskeletal:        General: No tenderness or deformity. Normal range of motion.     Cervical back: Normal range of motion.  Lymphadenopathy:     Cervical: No cervical adenopathy.  Skin:    General: Skin is warm and dry.     Findings: No erythema or rash.  Neurological:     Mental Status: She is alert and oriented to person, place, and time.  Psychiatric:        Behavior: Behavior normal.        Thought Content: Thought content normal.        Judgment: Judgment normal.     Lab Results  Component Value Date   WBC 14.6 (H) 08/15/2020   HGB 13.3 08/15/2020   HCT 41.3 08/15/2020   MCV 92.8 08/15/2020   PLT 290 08/15/2020   Lab Results  Component Value Date   FERRITIN 382 (H) 10/18/2008    IRON <10 (L) 10/18/2008   TIBC NOT CALC Not calculated due to Iron <10. 10/18/2008   UIBC 156 10/18/2008   IRONPCTSAT NOT CALC Not calculated due to Iron <10. 10/18/2008   Lab Results  Component Value Date   RETICCTPCT 0.9 10/18/2008   RBC 4.45 08/15/2020   No results found for: KPAFRELGTCHN, LAMBDASER, KAPLAMBRATIO No results found for: IGGSERUM, IGA, IGMSERUM No results found for:  Odetta Pink, SPEI   Chemistry      Component Value Date/Time   NA 139 08/15/2020 1141   NA 140 01/20/2017 1416   NA 141 01/17/2016 1047   K 4.0 08/15/2020 1141   K 3.9 01/20/2017 1416   K 3.2 (L) 01/17/2016 1047   CL 104 08/15/2020 1141   CL 102 01/20/2017 1416   CO2 27 08/15/2020 1141   CO2 26 01/20/2017 1416   CO2 27 01/17/2016 1047   BUN 17 08/15/2020 1141   BUN 15 01/20/2017 1416   BUN 6.6 (L) 01/17/2016 1047   CREATININE 0.71 08/15/2020 1141   CREATININE 1.1 01/20/2017 1416   CREATININE 0.7 01/17/2016 1047      Component Value Date/Time   CALCIUM 9.6 08/15/2020 1141   CALCIUM 9.1 01/20/2017 1416   CALCIUM 8.7 01/17/2016 1047   ALKPHOS 93 08/15/2020 1141   ALKPHOS 84 01/20/2017 1416   ALKPHOS 90 01/17/2016 1047   AST 18 08/15/2020 1141   AST 34 01/17/2016 1047   ALT 19 08/15/2020 1141   ALT 35 01/20/2017 1416   ALT 38 01/17/2016 1047   BILITOT 0.4 08/15/2020 1141   BILITOT 0.42 01/17/2016 1047       Impression and Plan: Leah [name] is a very pleasant 60 yo caucasian female with long standing history of metastatic carcinoid with recurrent liver metastasis.   From my point of view, everything really seems to be doing quite nicely with respect to the carcinoid.  I would not see any evidence of her having progressive disease.  I know that she is followed at [facility] by Dr. Crisoforo Oxford.    We will have to see what the chromogranin A level is.  Again, she tends to fluctuate.    I am glad she got through this reconstructive surgery.  She  really looks good.  She feels better.  She has more mobility.  We have her Somatuline every month.  We will have her come back to see [place] in about 3 months.   [name], MD 7/6/20221:25 PM

## 2020-08-16 LAB — CHROMOGRANIN A: Chromogranin A (ng/mL): 113.7 ng/mL — ABNORMAL HIGH (ref 0.0–101.8)

## 2020-08-17 NOTE — Telephone Encounter (Signed)
Spoke to pt told her calling to make sure she got her Rx for Zofran since I sent to local [facility] pharmacy. Pt said yes, thank you.

## 2020-08-19 ENCOUNTER — Other Ambulatory Visit: Payer: Self-pay | Admitting: Family Medicine

## 2020-08-20 ENCOUNTER — Other Ambulatory Visit: Payer: Self-pay

## 2020-09-02 ENCOUNTER — Other Ambulatory Visit: Payer: Self-pay | Admitting: Family Medicine

## 2020-09-02 ENCOUNTER — Other Ambulatory Visit (INDEPENDENT_AMBULATORY_CARE_PROVIDER_SITE_OTHER): Payer: Self-pay | Admitting: Family Medicine

## 2020-09-02 ENCOUNTER — Other Ambulatory Visit: Payer: Self-pay | Admitting: Physician Assistant

## 2020-09-02 DIAGNOSIS — E1169 Type 2 diabetes mellitus with other specified complication: Secondary | ICD-10-CM

## 2020-09-02 DIAGNOSIS — M4722 Other spondylosis with radiculopathy, cervical region: Secondary | ICD-10-CM

## 2020-09-03 ENCOUNTER — Other Ambulatory Visit: Payer: Self-pay | Admitting: Physician Assistant

## 2020-09-03 ENCOUNTER — Other Ambulatory Visit (INDEPENDENT_AMBULATORY_CARE_PROVIDER_SITE_OTHER): Payer: Self-pay | Admitting: Family Medicine

## 2020-09-03 ENCOUNTER — Other Ambulatory Visit: Payer: Self-pay | Admitting: Family Medicine

## 2020-09-03 ENCOUNTER — Other Ambulatory Visit: Payer: Self-pay | Admitting: Surgical

## 2020-09-03 DIAGNOSIS — E1169 Type 2 diabetes mellitus with other specified complication: Secondary | ICD-10-CM

## 2020-09-03 NOTE — Telephone Encounter (Signed)
Prescription refill denied.//AB/CMA

## 2020-09-03 NOTE — Telephone Encounter (Signed)
Last OV with Dr Wallace 

## 2020-09-03 NOTE — Telephone Encounter (Signed)
Dr.Wallace °

## 2020-09-04 ENCOUNTER — Other Ambulatory Visit: Payer: Self-pay

## 2020-09-04 MED ORDER — ONDANSETRON 4 MG PO TBDP: 4.0000 mg | ORAL_TABLET | Freq: Three times a day (TID) | ORAL | 1 refills | Status: DC | PRN

## 2020-09-04 MED ORDER — OZEMPIC (1 MG/DOSE) 4 MG/3ML ~~LOC~~ SOPN: 1.0000 mg | PEN_INJECTOR | SUBCUTANEOUS | 0 refills | Status: DC

## 2020-09-04 NOTE — Telephone Encounter (Signed)
Rx sent in

## 2020-09-06 ENCOUNTER — Ambulatory Visit (INDEPENDENT_AMBULATORY_CARE_PROVIDER_SITE_OTHER): Payer: 59

## 2020-09-06 VITALS — BP 122/84 | Temp 97.5°F | Ht 59.0 in | Wt 199.0 lb

## 2020-09-06 DIAGNOSIS — C50919 Malignant neoplasm of unspecified site of unspecified female breast: Secondary | ICD-10-CM

## 2020-09-11 ENCOUNTER — Ambulatory Visit (INDEPENDENT_AMBULATORY_CARE_PROVIDER_SITE_OTHER): Payer: 59 | Admitting: Family Medicine

## 2020-09-11 ENCOUNTER — Other Ambulatory Visit: Payer: Self-pay

## 2020-09-11 ENCOUNTER — Other Ambulatory Visit (INDEPENDENT_AMBULATORY_CARE_PROVIDER_SITE_OTHER): Payer: Self-pay | Admitting: Family Medicine

## 2020-09-11 ENCOUNTER — Encounter (INDEPENDENT_AMBULATORY_CARE_PROVIDER_SITE_OTHER): Payer: Self-pay | Admitting: Family Medicine

## 2020-09-11 VITALS — BP 122/84 | HR 114 | Temp 98.9°F | Ht 59.0 in | Wt 199.0 lb

## 2020-09-11 DIAGNOSIS — E1169 Type 2 diabetes mellitus with other specified complication: Secondary | ICD-10-CM | POA: Diagnosis not present

## 2020-09-11 DIAGNOSIS — F3289 Other specified depressive episodes: Secondary | ICD-10-CM | POA: Diagnosis not present

## 2020-09-11 DIAGNOSIS — M25561 Pain in right knee: Secondary | ICD-10-CM

## 2020-09-11 DIAGNOSIS — Z9189 Other specified personal risk factors, not elsewhere classified: Secondary | ICD-10-CM

## 2020-09-11 DIAGNOSIS — M25562 Pain in left knee: Secondary | ICD-10-CM

## 2020-09-11 DIAGNOSIS — Z6841 Body Mass Index (BMI) 40.0 and over, adult: Secondary | ICD-10-CM

## 2020-09-11 DIAGNOSIS — G8929 Other chronic pain: Secondary | ICD-10-CM

## 2020-09-11 MED ORDER — OZEMPIC (1 MG/DOSE) 4 MG/3ML ~~LOC~~ SOPN
1.0000 mg | PEN_INJECTOR | SUBCUTANEOUS | 0 refills | Status: DC
Start: 2020-09-11 — End: 2020-12-17

## 2020-09-11 MED ORDER — METOPROLOL SUCCINATE ER 100 MG PO TB24: ORAL_TABLET | ORAL | 1 refills | Status: DC

## 2020-09-11 MED ORDER — METFORMIN HCL 500 MG PO TABS: 500.0000 mg | ORAL_TABLET | Freq: Two times a day (BID) | ORAL | 3 refills | Status: DC

## 2020-09-11 NOTE — Progress Notes (Signed)
09/06/20 NIPPLE AREOLAR TATTOO PROCEDURE  PREOPERATIVE DIAGNOSIS:  Acquired absence of bilateral nipple areolar   POSTOPERATIVE DIAGNOSIS: Acquired absence of bilateral nipple areolar    PROCEDURES: bilateral nipple areolar tattoo   ATTENDING SURGEON: Dr. Claudia Desanctis  ANESTHESIA:  EMLA  COMPLICATIONS: None.  JUSTIFICATION FOR PROCEDURE:  Ms. Sudderth is a 60 y.o. female with a history of breast cancer status post bilateral breast reconstruction. The patient presents for nipple areolar complex tattoo. Risks, benefits, indications, and alternatives of the above described procedures were discussed with the patient and all the patient's questions were answered.   DESCRIPTION OF PROCEDURE: After informed consent was obtained and proper identification of patient and surgical site was made, the patient was taken to the procedure room and pre-procedure photo taken & entered into chart. Patient was then placed supine on the operating room table. A time out was performed to confirm patient's identity and surgical site. The patient was prepped and draped in the usual sterile fashion.  Using  a # 7 pronged  tattoo head, pigment was instilled to the designed nipple areolar complexes. Once adequate pigment had been applied to the nipple areolar complexes. a photo was taken. Vaseline/xeroform/ gauze dressing was applied. The patient tolerated the procedure well.   World Famous Tattoo Ink used: Fair Peach / Chipper [place] / Jeni Salles / Cool Honey / Emilia Beck /  Duration used as needed for topical anesthesia Lot #'s & exp dates on file

## 2020-09-11 NOTE — Patient Instructions (Signed)
Patient will use vaseline/xeroform & gauze dressing until healed. Call for any concerns

## 2020-09-12 ENCOUNTER — Encounter (INDEPENDENT_AMBULATORY_CARE_PROVIDER_SITE_OTHER): Payer: Self-pay

## 2020-09-12 NOTE — Progress Notes (Signed)
Chief Complaint:   OBESITY Leah [name] is here to discuss her progress with her obesity treatment plan along with follow-up of her obesity related diagnoses. See Medical Weight Management Flowsheet for complete bioelectrical impedance results.  Today's visit was #: 42 Starting weight: 226 lbs Starting date: 07/07/2019 Today's weight: 199 lbs Today's date: 09/11/2020 Weight change since last visit: 1 lb Total lbs lost to date: 27 lbs Body mass index is 40.19 kg/m.  Total weight loss percentage to date: -11.95%  Interim History: Leah [name] says she is happy with her progress overall.  She admits that she could have adhered to the plan better since her last visit.  Needs BMI below 40 for orthopedic surgery.  Nutrition Plan: the Category 2 Plan for 80% of the time. Activity:  As is. Anti-obesity medications: Ozempic 1 mg subcutaneously weekly. Reported side effects: None.  Assessment/Plan:   1. Type 2 diabetes mellitus with other specified complication, without long-term current use of insulin (HCC) Diabetes Mellitus: Not at goal. Medication: Ozempic 1 mg subcutaneously weekly.   Plan:  Will refill Ozempic today, as per below. The patient will continue to focus on protein-rich, low simple carbohydrate foods. We reviewed the importance of hydration, regular exercise for stress reduction, and restorative sleep.   Lab Results  Component Value Date   HGBA1C 6.4 (H) 03/05/2020   HGBA1C 6.8 (H) 11/02/2019   HGBA1C 7.3 (H) 07/06/2019   Lab Results  Component Value Date   MICROALBUR 0.9 07/06/2019   LDLCALC 63 11/02/2019   CREATININE 0.71 08/15/2020   - Refill Semaglutide, 1 MG/DOSE, (OZEMPIC, 1 MG/DOSE,) 4 MG/3ML SOPN; Inject 1 mg into the skin once a week.  Dispense: 3 mL; Refill: 0  2. Chronic pain of both knees Continue plan to work toward BMI below 40 to get orthopedic surgery.  3. Other depression, with emotional eating Controlled. Medication: Effexor XR 150 mg daily.  Plan:   Discussed cues and consequences, how thoughts affect eating, model of thoughts, feelings, and behaviors, and strategies for change by focusing on the cue. Discussed cognitive distortions, coping thoughts, and how to change your thoughts.  4. At risk for heart disease Due to Javier's current state of health and medical condition(s), she is at a higher risk for heart disease.  This puts the patient at much greater risk to subsequently develop cardiopulmonary conditions that can significantly affect patient's quality of life in a negative manner.    At least 8 minutes were spent on counseling Shaileen about these concerns today. Evidence-based interventions for health behavior change were utilized today including the discussion of self monitoring techniques, problem-solving barriers, and SMART goal setting techniques.  Specifically, regarding patient's less desirable eating habits and patterns, we employed the technique of small changes when Jacynda has not been able to fully commit to her prudent nutritional plan.  5. Obesity, current BMI 40.3  Course: Leah [name] is currently in the action stage of change. As such, her goal is to continue with weight loss efforts.   Nutrition goals: She has agreed to the Category 2 Plan.   Exercise goals:  As is.  Behavioral modification strategies: increasing lean protein intake, decreasing simple carbohydrates, increasing vegetables, increasing water intake, and emotional eating strategies.  Jazzma has agreed to follow-up with our clinic in 4 weeks. She was informed of the importance of frequent follow-up visits to maximize her success with intensive lifestyle modifications for her multiple health conditions.   Objective:   Blood pressure 122/84, pulse (!) 114,  temperature 98.9 F (37.2 C), height '4\' 11"'$  (1.499 m), weight 199 lb (90.3 kg), SpO2 97 %. Body mass index is 40.19 kg/m.  General: Cooperative, alert, well developed, in no acute distress. HEENT:  Conjunctivae and lids unremarkable. Cardiovascular: Regular rhythm.  Lungs: Normal work of breathing. Neurologic: No focal deficits.   Lab Results  Component Value Date   CREATININE 0.71 08/15/2020   BUN 17 08/15/2020   NA 139 08/15/2020   K 4.0 08/15/2020   CL 104 08/15/2020   CO2 27 08/15/2020   Lab Results  Component Value Date   ALT 19 08/15/2020   AST 18 08/15/2020   ALKPHOS 93 08/15/2020   BILITOT 0.4 08/15/2020   Lab Results  Component Value Date   HGBA1C 6.4 (H) 03/05/2020   HGBA1C 6.8 (H) 11/02/2019   HGBA1C 7.3 (H) 07/06/2019   HGBA1C 7.1 (H) 12/24/2018   HGBA1C 8.1 (A) 08/31/2018   Lab Results  Component Value Date   TSH 2.720 03/05/2020   Lab Results  Component Value Date   CHOL 140 11/02/2019   HDL 58 11/02/2019   LDLCALC 63 11/02/2019   TRIG 111 11/02/2019   CHOLHDL 2.4 11/02/2019   Lab Results  Component Value Date   VD25OH 50 07/22/2012   VD25OH 38 08/20/2010   Lab Results  Component Value Date   WBC 14.6 (H) 08/15/2020   HGB 13.3 08/15/2020   HCT 41.3 08/15/2020   MCV 92.8 08/15/2020   PLT 290 08/15/2020   Lab Results  Component Value Date   IRON <10 (L) 10/18/2008   TIBC NOT CALC Not calculated due to Iron <10. 10/18/2008   FERRITIN 382 (H) 10/18/2008   Attestation Statements:   Reviewed by clinician on day of visit: allergies, medications, problem list, medical history, surgical history, family history, social history, and previous encounter notes.  I, Water quality scientist, CMA, am acting as transcriptionist for Briscoe Deutscher, DO  I have reviewed the above documentation for accuracy and completeness, and I agree with the above. Briscoe Deutscher, DO

## 2020-09-12 NOTE — Telephone Encounter (Signed)
Prior authorization has been started. Will notify patient and provider once response received.

## 2020-09-12 NOTE — Telephone Encounter (Signed)
Pt last seen by Dr. Wallace.  

## 2020-09-17 NOTE — Telephone Encounter (Signed)
Please see covermymeds and mychart message sent to patient. Prior authorization not required for Ozempic.

## 2020-09-18 ENCOUNTER — Inpatient Hospital Stay: Payer: 59 | Attending: Hematology & Oncology

## 2020-09-18 ENCOUNTER — Other Ambulatory Visit: Payer: Self-pay

## 2020-09-18 VITALS — BP 154/80 | HR 106 | Temp 99.0°F | Resp 18

## 2020-09-18 DIAGNOSIS — C7B02 Secondary carcinoid tumors of liver: Secondary | ICD-10-CM | POA: Insufficient documentation

## 2020-09-18 DIAGNOSIS — C7B8 Other secondary neuroendocrine tumors: Secondary | ICD-10-CM

## 2020-09-18 DIAGNOSIS — C7A00 Malignant carcinoid tumor of unspecified site: Secondary | ICD-10-CM | POA: Insufficient documentation

## 2020-09-18 MED ORDER — LANREOTIDE ACETATE 120 MG/0.5ML ~~LOC~~ SOLN
120.0000 mg | Freq: Once | SUBCUTANEOUS | Status: AC
  Administered 2020-09-18: 120 mg via SUBCUTANEOUS

## 2020-09-18 NOTE — Patient Instructions (Signed)
Lanreotide injection What is this medication? LANREOTIDE (lan REE oh tide) is used to reduce blood levels of growth hormone in patients with a condition called acromegaly. It also works to slow or stop tumor growth in patients with neuroendocrine tumors and treat carcinoid syndrome. This medicine may be used for other purposes; ask your health care provider or pharmacist if you have questions. COMMON BRAND NAME(S): Somatuline [place] What should I tell my care team before I take this medication? They need to know if you have any of these conditions: diabetes gallbladder disease heart disease kidney disease liver disease thyroid disease an unusual or allergic reaction to lanreotide, other medicines, foods, dyes, or preservatives pregnant or trying to get pregnant breast-feeding How should I use this medication? This medicine is for injection under the skin. It is given by a health care professional in a hospital or clinic setting. Contact your pediatrician or health care professional regarding the use of this medicine in children. Special care may be needed. Overdosage: If you think you have taken too much of this medicine contact a poison control center or emergency room at once. NOTE: This medicine is only for you. Do not share this medicine with others. What if I miss a dose? It is important not to miss your dose. Call your doctor or health care professional if you are unable to keep an appointment. What may interact with this medication? This medicine may interact with the following medications: bromocriptine cyclosporine certain medicines for blood pressure, heart disease, irregular heart beat certain medicines for diabetes quinidine terfenadine This list may not describe all possible interactions. Give your health care provider a list of all the medicines, herbs, non-prescription drugs, or dietary supplements you use. Also tell them if you smoke, drink alcohol, or use illegal drugs.  Some items may interact with your medicine. What should I watch for while using this medication? Tell your doctor or healthcare professional if your symptoms do not start to get better or if they get worse. Visit your doctor or health care professional for regular checks on your progress. Your condition will be monitored carefully while you are receiving this medicine. This medicine may increase blood sugar. Ask your healthcare provider if changes in diet or medicines are needed if you have diabetes. You may need blood work done while you are taking this medicine. Women should inform their doctor if they wish to become pregnant or think they might be pregnant. There is a potential for serious side effects to an unborn child. Talk to your health care professional or pharmacist for more information. Do not breast-feed an infant while taking this medicine or for 6 months after stopping it. This medicine has caused ovarian failure in some women. This medicine may interfere with the ability to have a child. Talk with your doctor or health care professional if you are concerned about your fertility. What side effects may I notice from receiving this medication? Side effects that you should report to your doctor or health care professional as soon as possible: allergic reactions like skin rash, itching or hives, swelling of the face, lips, or tongue increased blood pressure severe stomach pain signs and symptoms of hgh blood sugar such as being more thirsty or hungry or having to urinate more than normal. You may also feel very tired or have blurry vision. signs and symptoms of low blood sugar such as feeling anxious; confusion; dizziness; increased hunger; unusually weak or tired; sweating; shakiness; cold; irritable; headache; blurred vision; fast   heartbeat; loss of consciousness unusually slow heartbeat Side effects that usually do not require medical attention (report to your doctor or health care  professional if they continue or are bothersome): constipation diarrhea dizziness headache muscle pain muscle spasms nausea pain, redness, or irritation at site where injected This list may not describe all possible side effects. Call your doctor for medical advice about side effects. You may report side effects to FDA at 1-800-FDA-1088. Where should I keep my medication? This drug is given in a hospital or clinic and will not be stored at home. NOTE: This sheet is a summary. It may not cover all possible information. If you have questions about this medicine, talk to your doctor, pharmacist, or health care provider.  2022 Elsevier/Gold Standard (2017-11-05 09:13:08)  

## 2020-10-09 ENCOUNTER — Encounter (INDEPENDENT_AMBULATORY_CARE_PROVIDER_SITE_OTHER): Payer: Self-pay | Admitting: Family Medicine

## 2020-10-09 ENCOUNTER — Other Ambulatory Visit: Payer: Self-pay

## 2020-10-09 ENCOUNTER — Ambulatory Visit (INDEPENDENT_AMBULATORY_CARE_PROVIDER_SITE_OTHER): Payer: 59 | Admitting: Family Medicine

## 2020-10-09 VITALS — BP 138/79 | HR 83 | Temp 97.9°F | Ht 59.0 in | Wt 199.0 lb

## 2020-10-09 DIAGNOSIS — E1169 Type 2 diabetes mellitus with other specified complication: Secondary | ICD-10-CM | POA: Diagnosis not present

## 2020-10-09 DIAGNOSIS — Z9189 Other specified personal risk factors, not elsewhere classified: Secondary | ICD-10-CM

## 2020-10-09 DIAGNOSIS — F3289 Other specified depressive episodes: Secondary | ICD-10-CM

## 2020-10-09 DIAGNOSIS — Z6841 Body Mass Index (BMI) 40.0 and over, adult: Secondary | ICD-10-CM

## 2020-10-09 DIAGNOSIS — M25562 Pain in left knee: Secondary | ICD-10-CM

## 2020-10-09 DIAGNOSIS — E66813 Obesity, class 3: Secondary | ICD-10-CM

## 2020-10-09 DIAGNOSIS — G8929 Other chronic pain: Secondary | ICD-10-CM

## 2020-10-09 DIAGNOSIS — M25561 Pain in right knee: Secondary | ICD-10-CM | POA: Diagnosis not present

## 2020-10-10 ENCOUNTER — Ambulatory Visit (INDEPENDENT_AMBULATORY_CARE_PROVIDER_SITE_OTHER): Payer: 59 | Admitting: Physician Assistant

## 2020-10-10 ENCOUNTER — Other Ambulatory Visit: Payer: Self-pay

## 2020-10-10 ENCOUNTER — Encounter: Payer: Self-pay | Admitting: Physician Assistant

## 2020-10-10 VITALS — BP 119/83 | HR 80 | Temp 97.8°F | Ht 59.0 in | Wt 200.5 lb

## 2020-10-10 DIAGNOSIS — K219 Gastro-esophageal reflux disease without esophagitis: Secondary | ICD-10-CM

## 2020-10-10 DIAGNOSIS — E1159 Type 2 diabetes mellitus with other circulatory complications: Secondary | ICD-10-CM | POA: Diagnosis not present

## 2020-10-10 DIAGNOSIS — C7B8 Other secondary neuroendocrine tumors: Secondary | ICD-10-CM

## 2020-10-10 DIAGNOSIS — Z23 Encounter for immunization: Secondary | ICD-10-CM

## 2020-10-10 DIAGNOSIS — I152 Hypertension secondary to endocrine disorders: Secondary | ICD-10-CM

## 2020-10-10 DIAGNOSIS — F419 Anxiety disorder, unspecified: Secondary | ICD-10-CM

## 2020-10-10 DIAGNOSIS — J301 Allergic rhinitis due to pollen: Secondary | ICD-10-CM | POA: Diagnosis not present

## 2020-10-10 NOTE — Progress Notes (Signed)
Chief Complaint:   OBESITY Leah [name] is here to discuss her progress with her obesity treatment plan along with follow-up of her obesity related diagnoses.   Today's visit was #: 16 Starting weight: 226 lbs Starting date: 07/07/2019 Today's weight: 199 lbs Today's date: 10/09/2020 Weight change since last visit: 0 Total lbs lost to date: 27 lbs Body mass index is 40.19 kg/m.  Total weight loss percentage to date: -11.95%  Current Meal Plan: the Category 2 Plan for 80% of the time.  Current Exercise Plan: None. Current Anti-Obesity Medications: Ozempic 1 mg subcutaneously weekly. Side effects: None.  Interim History:  Leah [name] is scheduled for TKA with Dr. Maureen Ralphs on 10/29/2020.  She is still just above 40 BMI.  She says she feels constipated.  Assessment/Plan:   1. Chronic pain of both knees Leah [name] is scheduled for TKA on 10/29/2020.  Will continue to follow along.  2. Type 2 diabetes mellitus with other specified complication, without long-term current use of insulin (HCC) Diabetes Mellitus: Not at goal. Medication: Ozempic 1 mg subcutaneously weekly, metformin 500 mg twice daily.   Plan: The patient will continue to focus on protein-rich, low simple carbohydrate foods. We reviewed the importance of hydration, regular exercise for stress reduction, and restorative sleep.   Lab Results  Component Value Date   HGBA1C 6.4 (H) 03/05/2020   HGBA1C 6.8 (H) 11/02/2019   HGBA1C 7.3 (H) 07/06/2019   Lab Results  Component Value Date   MICROALBUR 0.9 07/06/2019   LDLCALC 63 11/02/2019   CREATININE 0.71 08/15/2020   3. Other depression, with emotional eating Medication: Effexor-XR 150 mg daily.  Plan:  Discussed cues and consequences, how thoughts affect eating, model of thoughts, feelings, and behaviors, and strategies for change by focusing on the cue. Discussed cognitive distortions, coping thoughts, and how to change your thoughts.  4. At risk for heart disease Due to  Leah [name] current state of health and medical condition(s), she is at a higher risk for heart disease.  This puts the patient at much greater risk to subsequently develop cardiopulmonary conditions that can significantly affect patient's quality of life in a negative manner.    At least 8 minutes were spent on counseling Leah [name] about these concerns today. Evidence-based interventions for health behavior change were utilized today including the discussion of self monitoring techniques, problem-solving barriers, and SMART goal setting techniques.  Specifically, regarding patient's less desirable eating habits and patterns, we employed the technique of small changes when Leah [name] has not been able to fully commit to her prudent nutritional plan.  5. Obesity, current BMI 40.2  Course: Leah [name] is currently in the action stage of change. As such, her goal is to continue with weight loss efforts.   Nutrition goals: She has agreed to the Category 2 Plan.   Exercise goals:  Chair exercises, increased movement.  Behavioral modification strategies: increasing lean protein intake, decreasing simple carbohydrates, increasing vegetables, and increasing water intake.  Leah [name] has agreed to follow-up with our clinic in 4 weeks. She was informed of the importance of frequent follow-up visits to maximize her success with intensive lifestyle modifications for her multiple health conditions.   Objective:   Blood pressure 138/79, pulse 83, temperature 97.9 F (36.6 C), temperature source Oral, height '4\' 11"'$  (1.499 m), weight 199 lb (90.3 kg), SpO2 98 %. Body mass index is 40.19 kg/m.  General: Cooperative, alert, well developed, in no acute distress. HEENT: Conjunctivae and lids unremarkable. Cardiovascular: Regular rhythm.  Lungs: Normal work  of breathing. Neurologic: No focal deficits.   Lab Results  Component Value Date   CREATININE 0.71 08/15/2020   BUN 17 08/15/2020   NA 139 08/15/2020   K 4.0  08/15/2020   CL 104 08/15/2020   CO2 27 08/15/2020   Lab Results  Component Value Date   ALT 19 08/15/2020   AST 18 08/15/2020   ALKPHOS 93 08/15/2020   BILITOT 0.4 08/15/2020   Lab Results  Component Value Date   HGBA1C 6.4 (H) 03/05/2020   HGBA1C 6.8 (H) 11/02/2019   HGBA1C 7.3 (H) 07/06/2019   HGBA1C 7.1 (H) 12/24/2018   HGBA1C 8.1 (A) 08/31/2018   Lab Results  Component Value Date   TSH 2.720 03/05/2020   Lab Results  Component Value Date   CHOL 140 11/02/2019   HDL 58 11/02/2019   LDLCALC 63 11/02/2019   TRIG 111 11/02/2019   CHOLHDL 2.4 11/02/2019   Lab Results  Component Value Date   VD25OH 50 07/22/2012   VD25OH 38 08/20/2010   Lab Results  Component Value Date   WBC 14.6 (H) 08/15/2020   HGB 13.3 08/15/2020   HCT 41.3 08/15/2020   MCV 92.8 08/15/2020   PLT 290 08/15/2020   Lab Results  Component Value Date   IRON <10 (L) 10/18/2008   TIBC NOT CALC Not calculated due to Iron <10. 10/18/2008   FERRITIN 382 (H) 10/18/2008   Attestation Statements:   Reviewed by clinician on day of visit: allergies, medications, problem list, medical history, surgical history, family history, social history, and previous encounter notes.  I, Water quality scientist, CMA, am acting as transcriptionist for Briscoe Deutscher, DO  I have reviewed the above documentation for accuracy and completeness, and I agree with the above. Briscoe Deutscher, DO

## 2020-10-10 NOTE — Patient Instructions (Signed)
Good to meet you today! Best wishes on your upcoming surgery! Keep working hard on your weight loss and nutrition. You're doing great. Thanks for signing contract. Call for refills.

## 2020-10-10 NOTE — Progress Notes (Signed)
Established Patient Office Visit  Subjective:  Patient ID: Leah [name], female    DOB: 06-09-1960  Age: 60 y.o. MRN: [id number]  CC:  Chief Complaint  Patient presents with   Transitions Of Care    HPI ROGINA SCHIANO presents for [facility] from Dr. Rogers Blocker.  Acute concerns: -Left knee pain - Scheduled for left total knee replacement with Dr. Wynelle Link next month. -Weight - She has been working with Dr. Briscoe Deutscher; she has been prescribing Metformin and Ozempic  Chronic issues: -Neuroendocrine tumor of liver - Sees oncologist for once monthly injections. Starts to feel nauseated, vomiting, constipation / diarrhea as it gets to be time for her injection. Diagnosed at age 16. Takes ondansetron as needed. -HTN -Diovan 320 mg, Amlodipine 10 mg, Toprol XL 100 mg; stable -Anxiety - Effexor XR 150 mg, Clonazepam 0.5 mg prn   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HCC)    Constipation    Diabetes ([place])    Dyspnea    Fatty liver    Gallbladder problem    GE reflux    Hepatic encephalopathy ([place]) 2011   History of PCR DNA positive for HSV1    History of retinal vein occlusion, right eye    HTN (hypertension) 2011   Joint pain    Liver problem    Migraines    Neuroendocrine tumor 2011   Orthostatic hypotension    Osteoarthritis    Osteopenia    Palpitations    Sarcoidosis 2008   Sleep apnea    uses cpap   Surgical menopause     Past Surgical History:  Procedure Laterality Date   ABDOMINAL HYSTERECTOMY  2004   AUGMENTATION MAMMAPLASTY     BREAST BIOPSY Left    BREAST RECONSTRUCTION Bilateral 05/07/2020   Procedure: Revision of bilateral breast reconstruction;  Surgeon: Cindra Presume, MD;  Location: [place];  Service: Plastics;  Laterality: Bilateral;  2 hours   CESAREAN SECTION  [id number]   CHOLECYSTECTOMY  2006   HERNIA REPAIR      KNEE ARTHROSCOPY Left 09/2010   LAPAROSCOPIC HEPATECTOMY     LIPOSUCTION Bilateral 05/07/2020   Procedure: LIPOSUCTION with excision of lateral breast and axillary tissue;  Surgeon: Cindra Presume, MD;  Location: [place];  Service: Plastics;  Laterality: Bilateral;   MASTECTOMY Bilateral 2008   OOPHORECTOMY  2004   RECONSTRUCTION BREAST W/ LATISSIMUS DORSI FLAP Bilateral    TONSILLECTOMY  1976   TUBAL LIGATION Bilateral 2001    Family History  Problem Relation Age of Onset   Arthritis Maternal Grandmother    Arthritis Maternal Grandfather    Arthritis Paternal Grandmother    Arthritis Paternal Grandfather    Heart failure Paternal Grandfather    Breast cancer Mother 71   Lupus Mother    Heart failure Father    Hypertension Father    Hyperlipidemia Father    Sudden death Father    Obesity Father    Breast cancer Maternal Aunt 38   Ovarian cancer Maternal Aunt 60   Breast cancer Maternal Aunt 17    Social History   Socioeconomic History   Marital status: Married    Spouse name: Vicente Serene    Number of children: 2   Years of education: Not on file   Highest education level: Some  college, no degree  Occupational History   Occupation: Stay at [employer]  Tobacco Use   Smoking status: Never   Smokeless tobacco: Never   Tobacco comments:    never used tobacco  Vaping Use   Vaping Use: Never used  Substance and Sexual Activity   Alcohol use: No    Alcohol/week: 0.0 standard drinks   Drug use: No   Sexual activity: Yes    Partners: Male    Birth control/protection: Surgical    Comment: hysterectomy  Other Topics Concern   Not on file  Social History Narrative   Married to [place]. 2 sons (2001) Kasandra Knudsen and Cristie Hem (1999). Has dog. 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   Social Determinants of Health   Financial Resource Strain: Not on file  Food Insecurity: Not on file   Transportation Needs: Not on file  Physical Activity: Not on file  Stress: Not on file  Social Connections: Not on file  Intimate Partner Violence: Not on file    Outpatient Medications Prior to Visit  Medication Sig Dispense Refill   amLODipine (NORVASC) 10 MG tablet TAKE 1 TABLET BY MOUTH EVERY DAY 30 tablet 2   aspirin 81 MG EC tablet TAKE 1 TABLET BY MOUTH EVERY DAY *SWALLOW WHOLE* 90 tablet 1   baclofen (LIORESAL) 10 MG tablet Take 1 tablet (10 mg total) by mouth 3 (three) times daily. 30 each 0   clonazePAM (KLONOPIN) 0.5 MG tablet TAKE 1 TABLET BY MOUTH TWICE A DAY AS NEEDED FOR ANXIETY 30 tablet 0   fexofenadine (ALLEGRA) 180 MG tablet TAKE 1 TABLET BY MOUTH EVERY DAY 90 tablet 3   gabapentin (NEURONTIN) 100 MG capsule TAKE 1 CAPSULE BY MOUTH EVERYDAY AT BEDTIME 90 capsule 0   metFORMIN (GLUCOPHAGE) 500 MG tablet Take 1 tablet (500 mg total) by mouth 2 (two) times daily with a meal. 180 tablet 3   metoprolol succinate (TOPROL-XL) 100 MG 24 hr tablet TAKE 1 TABLET BY MOUTH EVERY EVENING WITH OR IMMEDIATELY FOLLOWING A MEAL 90 tablet 1   montelukast (SINGULAIR) 10 MG tablet TAKE 1 TABLET BY MOUTH EVERYDAY AT BEDTIME 90 tablet 1   naproxen (NAPROSYN) 500 MG tablet TAKE 1 TABLET TWICE A DAY WITH A MEAL 180 tablet 0   pantoprazole (PROTONIX) 40 MG tablet TAKE 1 TABLET BY MOUTH TWICE A DAY 180 tablet 1   PROAIR HFA 108 (90 Base) MCG/ACT inhaler INHALE 2 PUFFS INTO THE LUNGS EVERY 6 (SIX) HOURS AS NEEDED. FOR SHORTNESS OF BREATH. 6.7 Inhaler 4   rosuvastatin (CRESTOR) 10 MG tablet TAKE 1 TABLET BY MOUTH EVERY DAY 90 tablet 0   Semaglutide, 1 MG/DOSE, (OZEMPIC, 1 MG/DOSE,) 4 MG/3ML SOPN Inject 1 mg into the skin once a week. 3 mL 0   valACYclovir (VALTREX) 1000 MG tablet Take 1,000 mg by mouth 2 (two) times daily.     valsartan (DIOVAN) 320 MG tablet TAKE 1 TABLET BY MOUTH EVERY DAY 90 tablet 0   venlafaxine XR (EFFEXOR-XR) 150 MG 24 hr capsule TAKE 1 CAPSULE BY MOUTH EVERY MORNING 90  capsule 1   No facility-administered medications prior to visit.    No Known Allergies  ROS Review of Systems REFER TO HPI FOR PERTINENT POSITIVES AND NEGATIVES    Objective:    Physical Exam Vitals and nursing note reviewed.  Constitutional:      General: She is not in acute distress.    Appearance:  Normal appearance. She is obese. She is not ill-appearing.  HENT:     Head: Normocephalic.     Right Ear: External ear normal.     Left Ear: External ear normal.     Nose: Nose normal.     Mouth/Throat:     Mouth: Mucous membranes are moist.  Eyes:     Extraocular Movements: Extraocular movements intact.     Conjunctiva/sclera: Conjunctivae normal.     Pupils: Pupils are equal, round, and reactive to light.  Cardiovascular:     Rate and Rhythm: Normal rate and regular rhythm.     Pulses: Normal pulses.     Heart sounds: No murmur heard. Pulmonary:     Effort: Pulmonary effort is normal.     Breath sounds: Normal breath sounds.  Abdominal:     Tenderness: There is no abdominal tenderness.  Musculoskeletal:        General: Normal range of motion.     Cervical back: Normal range of motion.  Skin:    General: Skin is warm.  Neurological:     General: No focal deficit present.     Mental Status: She is alert and oriented to person, place, and time.     Gait: Gait normal.  Psychiatric:        Mood and Affect: Mood normal.        Behavior: Behavior normal.    BP 119/83   Pulse 80   Temp 97.8 F (36.6 C)   Ht '4\' 11"'  (1.499 m)   Wt 200 lb 8 oz (90.9 kg)   SpO2 98%   BMI 40.50 kg/m  Wt Readings from Last 3 Encounters:  10/10/20 200 lb 8 oz (90.9 kg)  10/09/20 199 lb (90.3 kg)  09/11/20 199 lb (90.3 kg)     Health Maintenance Due  Topic Date Due   Pneumococcal Vaccine 51-32 Years old (3 - PCV) 04/19/2015   Zoster Vaccines- Shingrix (2 of 2) 02/09/2017   COLONOSCOPY (Pts 45-55yr Insurance coverage will need to be confirmed)  02/10/2018   FOOT EXAM  03/06/2018    OPHTHALMOLOGY EXAM  05/23/2020   COVID-19 Vaccine (5 - Booster for Pfizer series) 05/27/2020   TETANUS/TDAP  08/21/2020   HEMOGLOBIN A1C  09/02/2020   INFLUENZA VACCINE  09/10/2020    There are no preventive care reminders to display for this patient.  Lab Results  Component Value Date   TSH 2.720 03/05/2020   Lab Results  Component Value Date   WBC 14.6 (H) 08/15/2020   HGB 13.3 08/15/2020   HCT 41.3 08/15/2020   MCV 92.8 08/15/2020   PLT 290 08/15/2020   Lab Results  Component Value Date   NA 139 08/15/2020   K 4.0 08/15/2020   CHLORIDE 102 01/17/2016   CO2 27 08/15/2020   GLUCOSE 123 (H) 08/15/2020   BUN 17 08/15/2020   CREATININE 0.71 08/15/2020   BILITOT 0.4 08/15/2020   ALKPHOS 93 08/15/2020   AST 18 08/15/2020   ALT 19 08/15/2020   PROT 7.1 08/15/2020   ALBUMIN 4.2 08/15/2020   CALCIUM 9.6 08/15/2020   ANIONGAP 8 08/15/2020   EGFR >90 01/17/2016   GFR 79.97 07/06/2019   Lab Results  Component Value Date   CHOL 140 11/02/2019   Lab Results  Component Value Date   HDL 58 11/02/2019   Lab Results  Component Value Date   LDLCALC 63 11/02/2019   Lab Results  Component Value Date   TRIG 111 11/02/2019  Lab Results  Component Value Date   CHOLHDL 2.4 11/02/2019   Lab Results  Component Value Date   HGBA1C 6.4 (H) 03/05/2020      Assessment & Plan:   Problem List Items Addressed This Visit   None  1. 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 Hypertension associated with type 2 diabetes mellitus (HCC) -Stable, to goal -Norvasc 10 mg, Diovan 320 mg, Toprol XL 100 mg  3. Gastroesophageal reflux disease without esophagitis -Stable, Protonix 40 mg  4. Non-seasonal allergic rhinitis due to pollen -Stable, Allegra and Singulair daily  5. Anxiety -Doing well with Effexor XR 150 mg -PDMP reviewed, Klonopin 0.5 mg bid prn , #15 every 30 days -Contract renewed today  6. Need for  influenza vaccination -Administered by CMA today    Follow-up: No follow-ups on file.    Leoni Goodness M Aryn Safran, PA-C

## 2020-10-12 ENCOUNTER — Other Ambulatory Visit: Payer: Self-pay | Admitting: Family Medicine

## 2020-10-12 DIAGNOSIS — I10 Essential (primary) hypertension: Secondary | ICD-10-CM

## 2020-10-12 DIAGNOSIS — M4722 Other spondylosis with radiculopathy, cervical region: Secondary | ICD-10-CM

## 2020-10-12 DIAGNOSIS — J301 Allergic rhinitis due to pollen: Secondary | ICD-10-CM

## 2020-10-16 ENCOUNTER — Inpatient Hospital Stay: Payer: 59 | Attending: Hematology & Oncology

## 2020-10-16 ENCOUNTER — Other Ambulatory Visit: Payer: Self-pay

## 2020-10-16 VITALS — BP 128/82 | HR 83 | Temp 99.4°F | Resp 19

## 2020-10-16 DIAGNOSIS — C7B02 Secondary carcinoid tumors of liver: Secondary | ICD-10-CM | POA: Diagnosis not present

## 2020-10-16 DIAGNOSIS — C7A00 Malignant carcinoid tumor of unspecified site: Secondary | ICD-10-CM | POA: Insufficient documentation

## 2020-10-16 DIAGNOSIS — C7B8 Other secondary neuroendocrine tumors: Secondary | ICD-10-CM

## 2020-10-16 MED ORDER — LANREOTIDE ACETATE 120 MG/0.5ML ~~LOC~~ SOLN
120.0000 mg | Freq: Once | SUBCUTANEOUS | Status: AC
  Administered 2020-10-16: 120 mg via SUBCUTANEOUS
  Filled 2020-10-16: qty 120

## 2020-10-16 NOTE — H&P (Signed)
TOTAL KNEE ADMISSION H&P  Patient is being admitted for left total knee arthroplasty.  Subjective:  Chief Complaint: Left knee pain.  HPI: Leah [name], 60 y.o. female has a history of pain and functional disability in the left knee due to arthritis and has failed non-surgical conservative treatments for greater than 12 weeks to include corticosteriod injections and activity modification. Onset of symptoms was gradual, starting  several  years ago with gradually worsening course since that time. The patient noted no past surgery on the left knee.  Patient currently rates pain in the left knee at 7 out of 10 with activity. Patient has worsening of pain with activity and weight bearing, pain that interferes with activities of daily living, and crepitus. Patient has evidence of  bone-on-bone arthritis in the medial and patellofemoral compartments with maginal osteophyte formation  by imaging studies. There is no active infection.  Patient Active Problem List   Diagnosis Date Noted   At risk for dehydration 07/04/2020   Depression 07/04/2020   Other fatigue 07/07/2019   Hepatic steatosis 01/03/2019   S/P right knee arthroscopy 10/19/2018   Hyperlipidemia associated with type 2 diabetes mellitus ([place]) 09/01/2018   Class 3 severe obesity with serious comorbidity and body mass index (BMI) of 45.0 to 49.9 in adult ([place]) 09/01/2018   OSA on CPAP 05/04/2018   Solitary pulmonary nodule 05/04/2018   History of prophylactic mastectomy of both breasts 11/04/2017   Atrophic vaginitis 04/12/2016   Osteopenia 09/27/2015   Branch retinal vein occlusion of left eye 12/12/2014   Hypertensive retinopathy of both eyes 12/12/2014   Lung nodule seen on imaging study 05/01/2014   Shortness of breath on exertion    Type 2 diabetes mellitus with other specified complication ([place]) [id number]   Morbid obesity ([place]) 03/01/2013   Carcinoid syndrome ([place]) 02/21/2013   HSV infection 09/14/2012   Metastatic  malignant neuroendocrine tumor to liver ([place]) 10/22/2010   Hypertension associated with type 2 diabetes mellitus ([place]) 10/22/2010   GERD (gastroesophageal reflux disease) 10/22/2010   Allergic rhinitis 10/22/2010   Sarcoidosis 10/22/2010   Osteoarthritis 10/22/2010   Anxiety 10/22/2010    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[place])    Constipation    Diabetes ([place])    Dyspnea    Fatty liver    Gallbladder problem    GE reflux    Hepatic encephalopathy ([place]) 2011   History of PCR DNA positive for HSV1    History of retinal vein occlusion, right eye    HTN (hypertension) 2011   Joint pain    Liver problem    Migraines    Neuroendocrine tumor 2011   Orthostatic hypotension    Osteoarthritis    Osteopenia    Palpitations    Sarcoidosis 2008   Sleep apnea    uses cpap   Surgical menopause     Past Surgical History:  Procedure Laterality Date   ABDOMINAL HYSTERECTOMY  2004   AUGMENTATION MAMMAPLASTY     BREAST BIOPSY Left    BREAST RECONSTRUCTION Bilateral 05/07/2020   Procedure: Revision of bilateral breast reconstruction;  Surgeon: Cindra Presume, MD;  Location: [place];  Service: Plastics;  Laterality: Bilateral;  2 hours   CESAREAN SECTION  1999,2001   CHOLECYSTECTOMY  2006   HERNIA REPAIR     KNEE  ARTHROSCOPY Left 09/2010   LAPAROSCOPIC HEPATECTOMY     LIPOSUCTION Bilateral 05/07/2020   Procedure: LIPOSUCTION with excision of lateral breast and axillary tissue;  Surgeon: Cindra Presume, MD;  Location: [place];  Service: Plastics;  Laterality: Bilateral;   MASTECTOMY Bilateral 2008   OOPHORECTOMY  2004   RECONSTRUCTION BREAST W/ LATISSIMUS DORSI FLAP Bilateral    TONSILLECTOMY  1976   TUBAL LIGATION Bilateral 2001    Prior to Admission medications   Medication Sig Start Date End Date Taking? Authorizing  Provider  amLODipine (NORVASC) 10 MG tablet TAKE 1 TABLET BY MOUTH EVERY DAY 07/26/20  Yes Vivi Barrack, MD  clonazePAM (KLONOPIN) 0.5 MG tablet TAKE 1 TABLET BY MOUTH TWICE A DAY AS NEEDED FOR ANXIETY 09/03/20  Yes Vivi Barrack, MD  fexofenadine (ALLEGRA) 180 MG tablet TAKE 1 TABLET BY MOUTH EVERY DAY 03/19/20  Yes Orma Flaming, MD  gabapentin (NEURONTIN) 100 MG capsule TAKE 1 CAPSULE BY MOUTH EVERYDAY AT BEDTIME 09/03/20  Yes Marin Olp, MD  metFORMIN (GLUCOPHAGE) 500 MG tablet Take 1 tablet (500 mg total) by mouth 2 (two) times daily with a meal. 09/11/20  Yes Briscoe Deutscher, DO  metoprolol succinate (TOPROL-XL) 100 MG 24 hr tablet TAKE 1 TABLET BY MOUTH EVERY EVENING WITH OR IMMEDIATELY FOLLOWING A MEAL 09/11/20  Yes Briscoe Deutscher, DO  naproxen (NAPROSYN) 500 MG tablet TAKE 1 TABLET TWICE A DAY WITH A MEAL 07/17/20  Yes Marin Olp, MD  pantoprazole (PROTONIX) 40 MG tablet TAKE 1 TABLET BY MOUTH TWICE A DAY 06/27/20  Yes Orma Flaming, MD  PROAIR HFA 108 [phone] Base) MCG/ACT inhaler INHALE 2 PUFFS INTO THE LUNGS EVERY 6 (SIX) HOURS AS NEEDED. FOR SHORTNESS OF BREATH. 12/24/15  Yes [name], NP  rosuvastatin (CRESTOR) 10 MG tablet TAKE 1 TABLET BY MOUTH EVERY DAY 08/20/20  Yes Allwardt, Alyssa M, PA-C  Semaglutide, 1 MG/DOSE, (OZEMPIC, 1 MG/DOSE,) 4 MG/3ML SOPN Inject 1 mg into the skin once a week. 09/11/20  Yes Briscoe Deutscher, DO  valACYclovir (VALTREX) 1000 MG tablet Take 1,000 mg by mouth 2 (two) times daily. 08/23/19  Yes [provider]  valsartan (DIOVAN) 320 MG tablet TAKE 1 TABLET BY MOUTH EVERY DAY 07/30/20  Yes Marin Olp, MD  venlafaxine XR (EFFEXOR-XR) 150 MG 24 hr capsule TAKE 1 CAPSULE BY MOUTH EVERY MORNING 06/27/20  Yes Orma Flaming, MD  aspirin 81 MG EC tablet TAKE 1 TABLET BY MOUTH EVERY DAY *SWALLOW WHOLE* 10/12/20   Allwardt, Randa Evens, PA-C  baclofen (LIORESAL) 10 MG tablet Take 1 tablet (10 mg total) by mouth 3 (three) times daily. 07/02/20    Eliezer Lofts, FNP  montelukast (SINGULAIR) 10 MG tablet TAKE 1 TABLET BY MOUTH AT BEDTIME 10/12/20   Allwardt, Randa Evens, PA-C    No Known Allergies  Social History   Socioeconomic History   Marital status: Married    Spouse name: Vicente Serene    Number of children: 2   Years of education: Not on file   Highest education level: Some college, no degree  Occupational History   Occupation: Stay at [employer]  Tobacco Use   Smoking status: Never   Smokeless tobacco: Never   Tobacco comments:    never used tobacco  Vaping Use   Vaping Use: Never used  Substance and Sexual Activity   Alcohol use: No    Alcohol/week: 0.0 standard drinks   Drug use: No   Sexual activity: Yes  Partners: Male    Birth control/protection: Surgical    Comment: hysterectomy  Other Topics Concern   Not on file  Social History Narrative   Married to [place]. 2 sons (2001) Kasandra Knudsen and Cristie Hem (1999). Has dog. 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   Social Determinants of Health   Financial Resource Strain: Not on file  Food Insecurity: Not on file  Transportation Needs: Not on file  Physical Activity: Not on file  Stress: Not on file  Social Connections: Not on file  Intimate Partner Violence: Not on file    Tobacco Use: Low Risk    Smoking Tobacco Use: Never   Smokeless Tobacco Use: Never   Social History   Substance and Sexual Activity  Alcohol Use No   Alcohol/week: 0.0 standard drinks    Family History  Problem Relation Age of Onset   Arthritis Maternal Grandmother    Arthritis Maternal Grandfather    Arthritis Paternal Grandmother    Arthritis Paternal Grandfather    Heart failure Paternal Grandfather    Breast cancer Mother 25   Lupus Mother    Heart failure Father    Hypertension Father    Hyperlipidemia Father    Sudden death Father    Obesity Father    Breast cancer Maternal Aunt 38   Ovarian cancer Maternal  Aunt 60   Breast cancer Maternal Aunt 40    Review of Systems  Constitutional:  Negative for chills and fever.  HENT:  Negative for congestion, sore throat and tinnitus.   Eyes:  Negative for double vision, photophobia and pain.  Respiratory:  Negative for cough, shortness of breath and wheezing.   Cardiovascular:  Negative for chest pain, palpitations and orthopnea.  Gastrointestinal:  Negative for heartburn, nausea and vomiting.  Genitourinary:  Negative for dysuria, frequency and urgency.  Musculoskeletal:  Positive for joint pain.  Neurological:  Negative for dizziness, weakness and headaches.   Objective:  Physical Exam: Well nourished and well developed.  General: Alert and oriented x3, cooperative and pleasant, no acute distress.  Head: normocephalic, atraumatic, neck supple.  Eyes: EOMI.  Respiratory: breath sounds clear in all fields, no wheezing, rales, or rhonchi. Cardiovascular: Regular rate and rhythm, no murmurs, gallops or rubs.  Abdomen: non-tender to palpation and soft, normoactive bowel sounds. Musculoskeletal:  Left Knee Exam:  Mild tenderness to palpation about the medial joint line of the right knee.  AROM 0-120 degrees.  No effusion noted.  No instability.  Moderate patellofemoral crepitus.   Calves soft and nontender. Motor function intact in LE. Strength 5/5 LE bilaterally. Neuro: Distal pulses 2+. Sensation to light touch intact in LE.  Imaging Review Plain radiographs demonstrate severe degenerative joint disease of the left knee. The overall alignment is neutral. The bone quality appears to be adequate for age and reported activity level.  Assessment/Plan:  End stage arthritis, left knee   The patient history, physical examination, clinical judgment of the provider and imaging studies are consistent with end stage degenerative joint disease of the left knee and total knee arthroplasty is deemed medically necessary. The treatment options including  medical management, injection therapy arthroscopy and arthroplasty were discussed at length. The risks and benefits of total knee arthroplasty were presented and reviewed. The risks due to aseptic loosening, infection, stiffness, patella tracking problems, thromboembolic complications and other imponderables were discussed. The patient acknowledged the explanation, agreed to proceed with the plan and consent was  signed. Patient is being admitted for inpatient treatment for surgery, pain control, PT, OT, prophylactic antibiotics, VTE prophylaxis, progressive ambulation and ADLs and discharge planning. The patient is planning to be discharged  home .   Patient's anticipated LOS is less than 2 midnights, meeting these requirements: - Lives within 1 hour of care - Has a competent adult at home to recover with post-op recover - NO history of  - Chronic pain requiring opioids  - Diabetes  - Coronary Artery Disease  - Heart failure  - Heart attack  - Stroke  - DVT/VTE  - Cardiac arrhythmia  - Respiratory Failure/COPD  - Renal failure  - Anemia  Therapy Plans: Outpatient therapy at [facility] Disposition: Home with husband Planned DVT Prophylaxis: Xarelto 10 mg QD DME Needed: None PCP: Alyssa Allwardt, PA-C (clearance in EPIC - appt 8/31) TXA: IV Allergies: NKDA Anesthesia Concerns: None BMI: 40.3 Last HgbA1c: Not diabetic Pharmacy: [facility] ([address])  Other: - Weight check on 9/16 - Neuroendocrine tumor of the liver (limit tylenol) - Stopping qsymia tomorrow (takes weekly) - Has tolerated oxycodone in the past   - Patient was instructed on what medications to stop prior to surgery. - Follow-up visit in 2 weeks with Dr. Wynelle Link - Begin physical therapy following surgery - Pre-operative lab work as pre-surgical testing - Prescriptions will be provided in hospital at time of discharge  Theresa Duty, PA-C Orthopedic Surgery [facility] [place]

## 2020-10-16 NOTE — Patient Instructions (Signed)
Lanreotide injection What is this medication? LANREOTIDE (lan REE oh tide) is used to reduce blood levels of growth hormone in patients with a condition called acromegaly. It also works to slow or stop tumor growth in patients with neuroendocrine tumors and treat carcinoid syndrome. This medicine may be used for other purposes; ask your health care provider or pharmacist if you have questions. COMMON BRAND NAME(S): Somatuline [place] What should I tell my care team before I take this medication? They need to know if you have any of these conditions: diabetes gallbladder disease heart disease kidney disease liver disease thyroid disease an unusual or allergic reaction to lanreotide, other medicines, foods, dyes, or preservatives pregnant or trying to get pregnant breast-feeding How should I use this medication? This medicine is for injection under the skin. It is given by a health care professional in a hospital or clinic setting. Contact your pediatrician or health care professional regarding the use of this medicine in children. Special care may be needed. Overdosage: If you think you have taken too much of this medicine contact a poison control center or emergency room at once. NOTE: This medicine is only for you. Do not share this medicine with others. What if I miss a dose? It is important not to miss your dose. Call your doctor or health care professional if you are unable to keep an appointment. What may interact with this medication? This medicine may interact with the following medications: bromocriptine cyclosporine certain medicines for blood pressure, heart disease, irregular heart beat certain medicines for diabetes quinidine terfenadine This list may not describe all possible interactions. Give your health care provider a list of all the medicines, herbs, non-prescription drugs, or dietary supplements you use. Also tell them if you smoke, drink alcohol, or use illegal drugs.  Some items may interact with your medicine. What should I watch for while using this medication? Tell your doctor or healthcare professional if your symptoms do not start to get better or if they get worse. Visit your doctor or health care professional for regular checks on your progress. Your condition will be monitored carefully while you are receiving this medicine. This medicine may increase blood sugar. Ask your healthcare provider if changes in diet or medicines are needed if you have diabetes. You may need blood work done while you are taking this medicine. Women should inform their doctor if they wish to become pregnant or think they might be pregnant. There is a potential for serious side effects to an unborn child. Talk to your health care professional or pharmacist for more information. Do not breast-feed an infant while taking this medicine or for 6 months after stopping it. This medicine has caused ovarian failure in some women. This medicine may interfere with the ability to have a child. Talk with your doctor or health care professional if you are concerned about your fertility. What side effects may I notice from receiving this medication? Side effects that you should report to your doctor or health care professional as soon as possible: allergic reactions like skin rash, itching or hives, swelling of the face, lips, or tongue increased blood pressure severe stomach pain signs and symptoms of hgh blood sugar such as being more thirsty or hungry or having to urinate more than normal. You may also feel very tired or have blurry vision. signs and symptoms of low blood sugar such as feeling anxious; confusion; dizziness; increased hunger; unusually weak or tired; sweating; shakiness; cold; irritable; headache; blurred vision; fast   heartbeat; loss of consciousness unusually slow heartbeat Side effects that usually do not require medical attention (report to your doctor or health care  professional if they continue or are bothersome): constipation diarrhea dizziness headache muscle pain muscle spasms nausea pain, redness, or irritation at site where injected This list may not describe all possible side effects. Call your doctor for medical advice about side effects. You may report side effects to FDA at 1-800-FDA-1088. Where should I keep my medication? This drug is given in a hospital or clinic and will not be stored at home. NOTE: This sheet is a summary. It may not cover all possible information. If you have questions about this medicine, talk to your doctor, pharmacist, or health care provider.  2022 Elsevier/Gold Standard (2017-11-05 09:13:08)  

## 2020-10-16 NOTE — Patient Instructions (Addendum)
DUE TO COVID-19 ONLY ONE VISITOR IS ALLOWED TO COME WITH YOU AND STAY IN THE WAITING ROOM ONLY DURING PRE OP AND PROCEDURE.   **NO VISITORS ARE ALLOWED IN THE SHORT STAY AREA OR RECOVERY ROOM!!**  IF YOU WILL BE ADMITTED INTO THE HOSPITAL YOU ARE ALLOWED ONLY TWO SUPPORT PEOPLE DURING VISITATION HOURS ONLY (10AM -8PM)   The support person(s) may change daily. The support person(s) must pass our screening, gel in and out, and wear a mask at all times, including in the patient's room. Patients must also wear a mask when staff or their support person are in the room.  No visitors under the age of 15. Any visitor under the age of 1 must be accompanied by an adult.   COVID SWAB TESTING MUST BE COMPLETED ON:  Thursday, 10-25-20 Between the hours of 8 and 3  **MUST PRESENT COMPLETED FORM AT TESTING SITE**    [facility] [place] (backside of the building)  You are not required to quarantine, however you are required to wear a well-fitted mask when you are out and around people not in your household.  Hand Hygiene often Do NOT share personal items Notify your provider if you are in close contact with someone who has COVID or you develop fever 100.4 or greater, new onset of sneezing, cough, sore throat, shortness of breath or body aches.         Your procedure is scheduled on: Monday, 10-29-20   Report to [facility] Main  Entrance   Report to Short Stay at 5:15 AM   [facility])    Call this number if you have problems the morning of surgery [phone]   Do not eat food :After Midnight.   May have liquids until 4:15 AM day of surgery  CLEAR LIQUID DIET  Foods Allowed                                                                     Foods Excluded  Water, Black Coffee (no milk/no creamer) and tea, regular and decaf                              liquids that you cannot  Plain Jell-O in any flavor  (No red)                         see through such as: Fruit  ices (not with fruit pulp)                                 milk, soups, orange juice  Iced Popsicles (No red)                                    All solid food                             Apple juices Sports drinks like Gatorade (No red) Lightly seasoned clear broth or consume(fat free) Sugar  Complete one G2 drink the morning of surgery at 4:15 AM  the day of surgery.       The day of surgery:  Drink ONE (1)  G2 the morning of surgery. Drink in one sitting. Do not sip.  This drink was given to you during your hospital  pre-op appointment visit. Nothing else to drink after completing the G2.          If you have questions, please contact your surgeon's office.     Oral Hygiene is also important to reduce your risk of infection.                                    Remember - BRUSH YOUR TEETH THE MORNING OF SURGERY WITH YOUR REGULAR TOOTHPASTE   Do NOT smoke after Midnight  Take these medicines the morning of surgery with A SIP OF WATER:  Amlodipine, Clonazepam, Fexofenadine, Metoprolol, Pantoprazole, Rosuvastatin, Valtrex, Effexor  How to Manage Your Diabetes Before and After Surgery  Why is it important to control my blood sugar before and after surgery? Improving blood sugar levels before and after surgery helps healing and can limit problems. A way of improving blood sugar control is eating a healthy diet by:  Eating less sugar and carbohydrates  Increasing activity/exercise  Talking with your doctor about reaching your blood sugar goals High blood sugars (greater than 180 mg/dL) can raise your risk of infections and slow your recovery, so you will need to focus on controlling your diabetes during the weeks before surgery. Make sure that the doctor who takes care of your diabetes knows about your planned surgery including the date and location.  How do I manage my blood sugar before surgery? Check your blood sugar at least 4 times a day, starting 2 days before surgery, to  make sure that the level is not too high or low. Check your blood sugar the morning of your surgery when you wake up and every 2 hours until you get to the Short Stay unit. If your blood sugar is less than 70 mg/dL, you will need to treat for low blood sugar: Do not take insulin. Treat a low blood sugar (less than 70 mg/dL) with  cup of clear juice (cranberry or apple), 4 glucose tablets, OR glucose gel. Recheck blood sugar in 15 minutes after treatment (to make sure it is greater than 70 mg/dL). If your blood sugar is not greater than 70 mg/dL on recheck, call [phone] for further instructions. Report your blood sugar to the short stay nurse when you get to Short Stay.  If you are admitted to the hospital after surgery: Your blood sugar will be checked by the staff and you will probably be given insulin after surgery (instead of oral diabetes medicines) to make sure you have good blood sugar levels. The goal for blood sugar control after surgery is 80-180 mg/dL.   WHAT DO I DO ABOUT MY DIABETES MEDICATION?  Do not take oral diabetes medicines (pills) the morning of surgery.  THE DAY BEFORE SURGERY: Take Metformin as prescribed.       THE MORNING OF SURGERY:  Do not take Metformin or Ozempic.  Reviewed and Endorsed by [facility] Patient Education Committee, August 2015                     Aspirin 81 mg    Bring CPAP mask and  tubing day of surgery             You may not have any metal on your body including hair pins, jewelry, and body piercing             Do not wear make-up, lotions, powders, perfumes or deodorant  Do not wear nail polish including gel and S&S, artificial/acrylic nails, or any other type of covering on natural nails including finger and toenails. If you have artificial nails, gel coating, etc. that needs to be removed by a nail salon please have this removed prior to surgery or surgery may need to be canceled/ delayed if the surgeon/ anesthesia feels like they  are unable to be safely monitored.   Do not shave  48 hours prior to surgery.          Do not bring valuables to the hospital. [place].   Contacts, dentures or bridgework may not be worn into surgery.   Bring small overnight bag day of surgery.   Special Instructions: Bring a copy of your healthcare power of attorney and living will documents the day of surgery if you haven't scanned them in before.  Please read over the following fact sheets you were given: IF YOU HAVE QUESTIONS ABOUT YOUR PRE OP INSTRUCTIONS PLEASE CALL [place] - Preparing for Surgery Before surgery, you can play an important role.  Because skin is not sterile, your skin needs to be as free of germs as possible.  You can reduce the number of germs on your skin by washing with CHG (chlorahexidine gluconate) soap before surgery.  CHG is an antiseptic cleaner which kills germs and bonds with the skin to continue killing germs even after washing. Please DO NOT use if you have an allergy to CHG or antibacterial soaps.  If your skin becomes reddened/irritated stop using the CHG and inform your nurse when you arrive at Short Stay. Do not shave (including legs and underarms) for at least 48 hours prior to the first CHG shower.  You may shave your face/neck.  Please follow these instructions carefully:  1.  Shower with CHG Soap the night before surgery and the  morning of surgery.  2.  If you choose to wash your hair, wash your hair first as usual with your normal  shampoo.  3.  After you shampoo, rinse your hair and body thoroughly to remove the shampoo.                             4.  Use CHG as you would any other liquid soap.  You can apply chg directly to the skin and wash.  Gently with a scrungie or clean washcloth.  5.  Apply the CHG Soap to your body ONLY FROM THE NECK DOWN.   Do   not use on face/ open                           Wound or open sores. Avoid contact  with eyes, ears mouth and   genitals (private parts).                       Wash face,  Genitals (private parts) with your normal soap.             6.  Wash thoroughly, paying special attention  to the area where your    surgery  will be performed.  7.  Thoroughly rinse your body with warm water from the neck down.  8.  DO NOT shower/wash with your normal soap after using and rinsing off the CHG Soap.                9.  Pat yourself dry with a clean towel.            10.  Wear clean pajamas.            11.  Place clean sheets on your bed the night of your first shower and do not  sleep with pets. Day of Surgery : Do not apply any lotions/deodorants the morning of surgery.  Please wear clean clothes to the hospital/surgery center.  FAILURE TO FOLLOW THESE INSTRUCTIONS MAY RESULT IN THE CANCELLATION OF YOUR SURGERY  PATIENT SIGNATURE_________________________________  NURSE SIGNATURE__________________________________  ________________________________________________________________________   Adam Phenix  An incentive spirometer is a tool that can help keep your lungs clear and active. This tool measures how well you are filling your lungs with each breath. Taking long deep breaths may help reverse or decrease the chance of developing breathing (pulmonary) problems (especially infection) following: A long period of time when you are unable to move or be active. BEFORE THE PROCEDURE  If the spirometer includes an indicator to show your best effort, your nurse or respiratory therapist will set it to a desired goal. If possible, sit up straight or lean slightly forward. Try not to slouch. Hold the incentive spirometer in an upright position. INSTRUCTIONS FOR USE  Sit on the edge of your bed if possible, or sit up as far as you can in bed or on a chair. Hold the incentive spirometer in an upright position. Breathe out normally. Place the mouthpiece in your mouth and seal your lips  tightly around it. Breathe in slowly and as deeply as possible, raising the piston or the ball toward the top of the column. Hold your breath for 3-5 seconds or for as long as possible. Allow the piston or ball to fall to the bottom of the column. Remove the mouthpiece from your mouth and breathe out normally. Rest for a few seconds and repeat Steps 1 through 7 at least 10 times every 1-2 hours when you are awake. Take your time and take a few normal breaths between deep breaths. The spirometer may include an indicator to show your best effort. Use the indicator as a goal to work toward during each repetition. After each set of 10 deep breaths, practice coughing to be sure your lungs are clear. If you have an incision (the cut made at the time of surgery), support your incision when coughing by placing a pillow or rolled up towels firmly against it. Once you are able to get out of bed, walk around indoors and cough well. You may stop using the incentive spirometer when instructed by your caregiver.  RISKS AND COMPLICATIONS Take your time so you do not get dizzy or light-headed. If you are in pain, you may need to take or ask for pain medication before doing incentive spirometry. It is harder to take a deep breath if you are having pain. AFTER USE Rest and breathe slowly and easily. It can be helpful to keep track of a log of your progress. Your caregiver can provide you with a simple table to help with this. If you are using the spirometer at home,  follow these instructions: [place] IF:  You are having difficultly using the spirometer. You have trouble using the spirometer as often as instructed. Your pain medication is not giving enough relief while using the spirometer. You develop fever of 100.5 F (38.1 C) or higher. SEEK IMMEDIATE MEDICAL CARE IF:  You cough up bloody sputum that had not been present before. You develop fever of 102 F (38.9 C) or greater. You develop  worsening pain at or near the incision site. MAKE SURE YOU:  Understand these instructions. Will watch your condition. Will get help right away if you are not doing well or get worse. Document Released: 06/09/2006 Document Revised: 04/21/2011 Document Reviewed: 08/10/2006 Exit[employer] Patient Information 2014 Exit[employer], [place].   ________________________________________________________________________  WHAT IS A BLOOD TRANSFUSION? Blood Transfusion Information  A transfusion is the replacement of blood or some of its parts. Blood is made up of multiple cells which provide different functions. Red blood cells carry oxygen and are used for blood loss replacement. White blood cells fight against infection. Platelets control bleeding. Plasma helps clot blood. Other blood products are available for specialized needs, such as hemophilia or other clotting disorders. BEFORE THE TRANSFUSION  Who gives blood for transfusions?  Healthy volunteers who are fully evaluated to make sure their blood is safe. This is blood bank blood. Transfusion therapy is the safest it has ever been in the practice of medicine. Before blood is taken from a donor, a complete history is taken to make sure that person has no history of diseases nor engages in risky social behavior (examples are intravenous drug use or sexual activity with multiple partners). The donor's travel history is screened to minimize risk of transmitting infections, such as malaria. The donated blood is tested for signs of infectious diseases, such as HIV and hepatitis. The blood is then tested to be sure it is compatible with you in order to minimize the chance of a transfusion reaction. If you or a relative donates blood, this is often done in anticipation of surgery and is not appropriate for emergency situations. It takes many days to process the donated blood. RISKS AND COMPLICATIONS Although transfusion therapy is very safe and saves many lives, the  main dangers of transfusion include:  Getting an infectious disease. Developing a transfusion reaction. This is an allergic reaction to something in the blood you were given. Every precaution is taken to prevent this. The decision to have a blood transfusion has been considered carefully by your caregiver before blood is given. Blood is not given unless the benefits outweigh the risks. AFTER THE TRANSFUSION Right after receiving a blood transfusion, you will usually feel much better and more energetic. This is especially true if your red blood cells have gotten low (anemic). The transfusion raises the level of the red blood cells which carry oxygen, and this usually causes an energy increase. The nurse administering the transfusion will monitor you carefully for complications. HOME CARE INSTRUCTIONS  No special instructions are needed after a transfusion. You may find your energy is better. Speak with your caregiver about any limitations on activity for underlying diseases you may have. SEEK MEDICAL CARE IF:  Your condition is not improving after your transfusion. You develop redness or irritation at the intravenous (IV) site. SEEK IMMEDIATE MEDICAL CARE IF:  Any of the following symptoms occur over the next 12 hours: Shaking chills. You have a temperature by mouth above 102 F (38.9 C), not controlled by medicine. Chest, back, or muscle  pain. People around you feel you are not acting correctly or are confused. Shortness of breath or difficulty breathing. Dizziness and fainting. You get a rash or develop hives. You have a decrease in urine output. Your urine turns a dark color or changes to pink, red, or brown. Any of the following symptoms occur over the next 10 days: You have a temperature by mouth above 102 F (38.9 C), not controlled by medicine. Shortness of breath. Weakness after normal activity. The white part of the eye turns yellow (jaundice). You have a decrease in the amount  of urine or are urinating less often. Your urine turns a dark color or changes to pink, red, or brown. Document Released: 01/25/2000 Document Revised: 04/21/2011 Document Reviewed: 09/13/2007 [facility] Patient Information 2014 [place], [place].  _______________________________________________________________________

## 2020-10-16 NOTE — Progress Notes (Addendum)
COVID swab appointment:  10-25-20  COVID Vaccine Completed:  Yes x4 Date COVID Vaccine completed: 04-22-19, 05-13-19 Has received booster: Yes x2 11-22-19, 01-27-20 COVID vaccine manufacturer: Pfizer      Date of COVID positive in last 90 days:  No  PCP - Theresa Duty, PA-C Cardiologist - Quay Burow, MD  Medical clearance on chart dated 10-12-20 from [place], PA-C  Chest x-ray - N/A EKG - 10-17-20 Epic Stress Test - N/A ECHO - greater than 2 years Cardiac Cath - N/A Pacemaker/ICD device last checked: Spinal Cord Stimulator:  Sleep Study - Yes, +sleep apnea CPAP - Yes  Fasting Blood Sugar - Does not check Checks Blood Sugar _____ times a day  Blood Thinner Instructions: Aspirin Instructions:  ASA 81 mg. To stop one week prior Last Dose:  Activity level:  Can go up a flight of stairs and perform activities of daily living without stopping and without symptoms of chest pain or shortness of breath.  Does have limitations due to knee pain      Anesthesia review: Sarcoidosis, OSA on CPAP, HTN, DM.  Hx of palpitations evaluated by cardiology  Patient denies shortness of breath, fever, cough and chest pain at PAT appointment   Patient verbalized understanding of instructions that were given to them at the PAT appointment. Patient was also instructed that they will need to review over the PAT instructions again at home before surgery.

## 2020-10-17 ENCOUNTER — Encounter (HOSPITAL_COMMUNITY)
Admission: RE | Admit: 2020-10-17 | Discharge: 2020-10-17 | Disposition: A | Discharge status: Home / Self Care | Payer: 59 | Source: Ambulatory Visit | Attending: Orthopedic Surgery | Admitting: Orthopedic Surgery

## 2020-10-17 ENCOUNTER — Encounter (HOSPITAL_COMMUNITY): Payer: Self-pay

## 2020-10-17 DIAGNOSIS — Z01818 Encounter for other preprocedural examination: Secondary | ICD-10-CM | POA: Diagnosis not present

## 2020-10-17 LAB — COMPREHENSIVE METABOLIC PANEL
ALT: 20 U/L (ref 0–44)
AST: 23 U/L (ref 15–41)
Albumin: 4 g/dL (ref 3.5–5.0)
Alkaline Phosphatase: 107 U/L (ref 38–126)
Anion gap: 7 (ref 5–15)
BUN: 19 mg/dL (ref 8–23)
CO2: 25 mmol/L (ref 22–32)
Calcium: 9.5 mg/dL (ref 8.9–10.3)
Chloride: 107 mmol/L (ref 98–111)
Creatinine, Ser: 0.83 mg/dL (ref 0.44–1.00)
GFR, Estimated: >60 mL/min (ref >60)
Glucose, Bld: 105 mg/dL — ABNORMAL HIGH (ref 70–99)
Potassium: 4.9 mmol/L (ref 3.5–5.1)
Sodium: 139 mmol/L (ref 135–145)
Total Bilirubin: 0.6 mg/dL (ref 0.3–1.2)
Total Protein: 7.9 g/dL (ref 6.5–8.1)

## 2020-10-17 LAB — PROTIME-INR
INR: 1 (ref 0.8–1.2)
Prothrombin Time: 13.7 seconds (ref 11.4–15.2)

## 2020-10-17 LAB — TYPE AND SCREEN
ABO/RH(D): A POS
Antibody Screen: NEGATIVE

## 2020-10-17 LAB — CBC
HCT: 41.7 % (ref 36.0–46.0)
Hemoglobin: 13.3 g/dL (ref 12.0–15.0)
MCH: 29.7 pg (ref 26.0–34.0)
MCHC: 31.9 g/dL (ref 30.0–36.0)
MCV: 93.1 fL (ref 80.0–100.0)
Platelets: 306 10*3/uL (ref 150–400)
RBC: 4.48 MIL/uL (ref 3.87–5.11)
RDW: 13.6 % (ref 11.5–15.5)
WBC: 9.6 10*3/uL (ref 4.0–10.5)
nRBC: 0 % (ref 0.0–0.2)

## 2020-10-17 LAB — SURGICAL PCR SCREEN
MRSA, PCR: NEGATIVE
Staphylococcus aureus: NEGATIVE

## 2020-10-17 LAB — HEMOGLOBIN A1C
Hgb A1c MFr Bld: 5.8 % — ABNORMAL HIGH (ref 4.8–5.6)
Mean Plasma Glucose: 119.76 mg/dL

## 2020-10-17 LAB — GLUCOSE, CAPILLARY: Glucose-Capillary: 107 mg/dL — ABNORMAL HIGH (ref 70–99)

## 2020-10-18 ENCOUNTER — Other Ambulatory Visit (HOSPITAL_COMMUNITY): Payer: 59

## 2020-10-25 ENCOUNTER — Other Ambulatory Visit: Payer: Self-pay | Admitting: Orthopedic Surgery

## 2020-10-25 ENCOUNTER — Other Ambulatory Visit: Payer: Self-pay | Admitting: Family Medicine

## 2020-10-25 LAB — SARS CORONAVIRUS 2 (TAT 6-24 HRS): SARS Coronavirus 2: NEGATIVE

## 2020-10-26 NOTE — Anesthesia Preprocedure Evaluation (Addendum)
Anesthesia Evaluation  Patient identified by MRN, date of birth, ID band Patient awake    Reviewed: Allergy & Precautions, NPO status , Patient's Chart, lab work & pertinent test results, reviewed documented beta blocker date and time   Airway Mallampati: III  TM Distance: >3 FB Neck ROM: Full    Dental no notable dental hx. (+) Teeth Intact, Dental Advisory Given   Pulmonary shortness of breath and with exertion, sleep apnea and Continuous Positive Airway Pressure Ventilation ,  Sarcoidosis diagnosed 2008   Pulmonary exam normal breath sounds clear to auscultation       Cardiovascular hypertension, Pt. on medications and Pt. on home beta blockers Normal cardiovascular exam+ dysrhythmias (hx palpitations ) + Valvular Problems/Murmurs (mild MR) MR  Rhythm:Regular Rate:Normal  Echo 2010: 1. Left ventricle: Systolic function was normal. The estimated   ejection fraction was in the range of 55% to 60%.  2. Mitral valve: Mild regurgitation.  3. Pulmonary arteries: PA peak pressure: 63m Hg (S).     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 Starts to feel nauseated, vomiting, constipation / diarrhea as it gets to be time for her injection. Diagnosed at age 60 Takes ondansetron as needed. Last shot about 1 week ago. Symptoms: morning sickness, constipation and then diarrhea  a1c 5.8  Renal/GU negative Renal ROS  negative genitourinary   Musculoskeletal  (+) Arthritis , Osteoarthritis,    Abdominal (+) + obese,   Peds  Hematology negative hematology ROS (+) hct 41.7, plt 306 INR 1.0   Anesthesia Other Findings Breast ca s/p mastectomy B/L   Reproductive/Obstetrics negative OB ROS                            Anesthesia Physical Anesthesia Plan  ASA: 3  Anesthesia Plan: Spinal, Regional and MAC   Post-op Pain Management:  Regional for Post-op pain   Induction:   PONV Risk Score and Plan: 2 and Propofol infusion and TIVA  Airway Management Planned: Natural Airway and Nasal Cannula  Additional Equipment: None  Intra-op Plan:   Post-operative Plan:   Informed Consent: I have reviewed the patients History and Physical, chart, labs and discussed the procedure including the risks, benefits and alternatives for the proposed anesthesia with the patient or authorized representative who has indicated his/her understanding and acceptance.     Dental advisory given  Plan Discussed with: CRNA  Anesthesia Plan Comments:        Anesthesia Quick Evaluation

## 2020-10-29 ENCOUNTER — Encounter (HOSPITAL_COMMUNITY)
Admission: RE | Disposition: A | Discharge status: Home / Self Care | Payer: Self-pay | Source: Home / Self Care | Attending: Orthopedic Surgery

## 2020-10-29 ENCOUNTER — Ambulatory Visit (HOSPITAL_COMMUNITY): Payer: 59 | Admitting: Physician Assistant

## 2020-10-29 ENCOUNTER — Observation Stay (HOSPITAL_COMMUNITY)
Admission: RE | Admit: 2020-10-29 | Discharge: 2020-10-30 | Disposition: A | Discharge status: Home / Self Care | Payer: 59 | Attending: Orthopedic Surgery | Admitting: Orthopedic Surgery

## 2020-10-29 ENCOUNTER — Ambulatory Visit (HOSPITAL_COMMUNITY): Payer: 59 | Admitting: Certified Registered"

## 2020-10-29 ENCOUNTER — Other Ambulatory Visit: Payer: Self-pay

## 2020-10-29 DIAGNOSIS — Z7984 Long term (current) use of oral hypoglycemic drugs: Secondary | ICD-10-CM | POA: Insufficient documentation

## 2020-10-29 DIAGNOSIS — Z8589 Personal history of malignant neoplasm of other organs and systems: Secondary | ICD-10-CM | POA: Insufficient documentation

## 2020-10-29 DIAGNOSIS — M179 Osteoarthritis of knee, unspecified: Secondary | ICD-10-CM

## 2020-10-29 DIAGNOSIS — Z7982 Long term (current) use of aspirin: Secondary | ICD-10-CM | POA: Diagnosis not present

## 2020-10-29 DIAGNOSIS — M1712 Unilateral primary osteoarthritis, left knee: Secondary | ICD-10-CM | POA: Diagnosis not present

## 2020-10-29 DIAGNOSIS — Z8505 Personal history of malignant neoplasm of liver: Secondary | ICD-10-CM | POA: Insufficient documentation

## 2020-10-29 DIAGNOSIS — I10 Essential (primary) hypertension: Secondary | ICD-10-CM | POA: Insufficient documentation

## 2020-10-29 DIAGNOSIS — Z79899 Other long term (current) drug therapy: Secondary | ICD-10-CM | POA: Insufficient documentation

## 2020-10-29 DIAGNOSIS — M1710 Unilateral primary osteoarthritis, unspecified knee: Secondary | ICD-10-CM | POA: Diagnosis present

## 2020-10-29 DIAGNOSIS — Z96651 Presence of right artificial knee joint: Secondary | ICD-10-CM | POA: Diagnosis not present

## 2020-10-29 DIAGNOSIS — E119 Type 2 diabetes mellitus without complications: Secondary | ICD-10-CM | POA: Diagnosis not present

## 2020-10-29 HISTORY — PX: TOTAL KNEE ARTHROPLASTY: SHX125

## 2020-10-29 LAB — GLUCOSE, CAPILLARY: Glucose-Capillary: 112 mg/dL — ABNORMAL HIGH (ref 70–99)

## 2020-10-29 SURGERY — ARTHROPLASTY, KNEE, TOTAL
Anesthesia: Monitor Anesthesia Care | Site: Knee | Laterality: Left

## 2020-10-29 MED ORDER — ROPIVACAINE HCL 5 MG/ML IJ SOLN
INTRAMUSCULAR | Status: DC | PRN
  Administered 2020-10-29: 30 mL via PERINEURAL

## 2020-10-29 MED ORDER — ACETAMINOPHEN 500 MG PO TABS: 1000.0000 mg | ORAL_TABLET | Freq: Once | ORAL | Status: DC

## 2020-10-29 MED ORDER — DIPHENHYDRAMINE HCL 12.5 MG/5ML PO ELIX: 12.5000 mg | ORAL_SOLUTION | ORAL | Status: DC | PRN

## 2020-10-29 MED ORDER — BUPIVACAINE LIPOSOME 1.3 % IJ SUSP
INTRAMUSCULAR | Status: AC
  Filled 2020-10-29: qty 20

## 2020-10-29 MED ORDER — LORATADINE 10 MG PO TABS
10.0000 mg | ORAL_TABLET | Freq: Every day | ORAL | Status: DC
  Administered 2020-10-30: 10 mg via ORAL
  Filled 2020-10-29: qty 1

## 2020-10-29 MED ORDER — DEXAMETHASONE SODIUM PHOSPHATE 10 MG/ML IJ SOLN
8.0000 mg | Freq: Once | INTRAMUSCULAR | Status: AC
  Administered 2020-10-29: 8 mg via INTRAVENOUS

## 2020-10-29 MED ORDER — PROMETHAZINE HCL 25 MG/ML IJ SOLN: 6.2500 mg | INTRAMUSCULAR | Status: DC | PRN

## 2020-10-29 MED ORDER — POLYETHYLENE GLYCOL 3350 17 G PO PACK: 17.0000 g | PACK | Freq: Every day | ORAL | Status: DC | PRN

## 2020-10-29 MED ORDER — ACETAMINOPHEN 10 MG/ML IV SOLN
1000.0000 mg | Freq: Four times a day (QID) | INTRAVENOUS | Status: DC
Start: 2020-10-29 — End: 2020-10-29
  Administered 2020-10-29: 1000 mg via INTRAVENOUS

## 2020-10-29 MED ORDER — OXYCODONE HCL 5 MG/5ML PO SOLN: 5.0000 mg | Freq: Once | ORAL | Status: DC | PRN

## 2020-10-29 MED ORDER — SODIUM CHLORIDE 0.9 % IR SOLN
Status: DC | PRN
  Administered 2020-10-29: 1000 mL

## 2020-10-29 MED ORDER — CEFAZOLIN SODIUM-DEXTROSE 2-4 GM/100ML-% IV SOLN
2.0000 g | INTRAVENOUS | Status: AC
  Administered 2020-10-29: 2 g via INTRAVENOUS

## 2020-10-29 MED ORDER — METOPROLOL SUCCINATE ER 50 MG PO TB24
100.0000 mg | ORAL_TABLET | Freq: Every day | ORAL | Status: DC
  Administered 2020-10-30: 100 mg via ORAL
  Filled 2020-10-29: qty 2

## 2020-10-29 MED ORDER — LACTATED RINGERS IV SOLN: INTRAVENOUS | Status: DC

## 2020-10-29 MED ORDER — CLONAZEPAM 0.5 MG PO TABS: 0.5000 mg | ORAL_TABLET | Freq: Two times a day (BID) | ORAL | Status: DC | PRN

## 2020-10-29 MED ORDER — METOCLOPRAMIDE HCL 5 MG PO TABS: 5.0000 mg | ORAL_TABLET | Freq: Three times a day (TID) | ORAL | Status: DC | PRN

## 2020-10-29 MED ORDER — ONDANSETRON HCL 4 MG/2ML IJ SOLN
INTRAMUSCULAR | Status: DC | PRN
  Administered 2020-10-29: 4 mg via INTRAVENOUS

## 2020-10-29 MED ORDER — 0.9 % SODIUM CHLORIDE (POUR BTL) OPTIME
TOPICAL | Status: DC | PRN
  Administered 2020-10-29: 1000 mL

## 2020-10-29 MED ORDER — MORPHINE SULFATE (PF) 2 MG/ML IV SOLN
0.5000 mg | INTRAVENOUS | Status: DC | PRN
  Administered 2020-10-29: 1 mg via INTRAVENOUS
  Filled 2020-10-29: qty 1

## 2020-10-29 MED ORDER — SODIUM CHLORIDE 0.9 % IV SOLN: INTRAVENOUS | Status: DC

## 2020-10-29 MED ORDER — DEXAMETHASONE SODIUM PHOSPHATE 10 MG/ML IJ SOLN
INTRAMUSCULAR | Status: DC | PRN
  Administered 2020-10-29: 10 mg

## 2020-10-29 MED ORDER — ACETAMINOPHEN 10 MG/ML IV SOLN
INTRAVENOUS | Status: AC
  Filled 2020-10-29: qty 100

## 2020-10-29 MED ORDER — DOCUSATE SODIUM 100 MG PO CAPS
100.0000 mg | ORAL_CAPSULE | Freq: Two times a day (BID) | ORAL | Status: DC
  Administered 2020-10-29 – 2020-10-30 (×3): 100 mg via ORAL
  Filled 2020-10-29 (×3): qty 1

## 2020-10-29 MED ORDER — VALACYCLOVIR HCL 500 MG PO TABS
1000.0000 mg | ORAL_TABLET | Freq: Two times a day (BID) | ORAL | Status: DC
  Administered 2020-10-29 – 2020-10-30 (×2): 1000 mg via ORAL
  Filled 2020-10-29 (×2): qty 2

## 2020-10-29 MED ORDER — CEFAZOLIN SODIUM-DEXTROSE 2-4 GM/100ML-% IV SOLN
INTRAVENOUS | Status: AC
  Filled 2020-10-29: qty 100

## 2020-10-29 MED ORDER — PHENYLEPHRINE HCL-NACL 20-0.9 MG/250ML-% IV SOLN
INTRAVENOUS | Status: DC | PRN
  Administered 2020-10-29: 20 ug/min via INTRAVENOUS

## 2020-10-29 MED ORDER — IRBESARTAN 150 MG PO TABS
300.0000 mg | ORAL_TABLET | Freq: Every day | ORAL | Status: DC
  Administered 2020-10-30: 300 mg via ORAL
  Filled 2020-10-29: qty 2

## 2020-10-29 MED ORDER — RIVAROXABAN 10 MG PO TABS
10.0000 mg | ORAL_TABLET | Freq: Every day | ORAL | Status: DC
  Administered 2020-10-30: 10 mg via ORAL
  Filled 2020-10-29: qty 1

## 2020-10-29 MED ORDER — LIDOCAINE 2% (20 MG/ML) 5 ML SYRINGE
INTRAMUSCULAR | Status: DC | PRN
  Administered 2020-10-29: 40 mg via INTRAVENOUS

## 2020-10-29 MED ORDER — ONDANSETRON HCL 4 MG PO TABS: 4.0000 mg | ORAL_TABLET | Freq: Four times a day (QID) | ORAL | Status: DC | PRN

## 2020-10-29 MED ORDER — MENTHOL 3 MG MT LOZG: 1.0000 | LOZENGE | OROMUCOSAL | Status: DC | PRN

## 2020-10-29 MED ORDER — CHLORHEXIDINE GLUCONATE 0.12 % MT SOLN: 15.0000 mL | Freq: Once | OROMUCOSAL | Status: AC

## 2020-10-29 MED ORDER — ONDANSETRON HCL 4 MG/2ML IJ SOLN
4.0000 mg | Freq: Four times a day (QID) | INTRAMUSCULAR | Status: DC | PRN
  Administered 2020-10-29: 4 mg via INTRAVENOUS
  Filled 2020-10-29: qty 2

## 2020-10-29 MED ORDER — TRANEXAMIC ACID-NACL 1000-0.7 MG/100ML-% IV SOLN
1000.0000 mg | INTRAVENOUS | Status: AC
  Administered 2020-10-29: 1000 mg via INTRAVENOUS

## 2020-10-29 MED ORDER — BUPIVACAINE IN DEXTROSE 0.75-8.25 % IT SOLN
INTRATHECAL | Status: DC | PRN
  Administered 2020-10-29: 1.7 mL via INTRATHECAL

## 2020-10-29 MED ORDER — METHOCARBAMOL 500 MG IVPB - SIMPLE MED
500.0000 mg | Freq: Four times a day (QID) | INTRAVENOUS | Status: DC | PRN
  Filled 2020-10-29: qty 50

## 2020-10-29 MED ORDER — SODIUM CHLORIDE (PF) 0.9 % IJ SOLN
INTRAMUSCULAR | Status: AC
  Filled 2020-10-29: qty 10

## 2020-10-29 MED ORDER — TRANEXAMIC ACID-NACL 1000-0.7 MG/100ML-% IV SOLN
INTRAVENOUS | Status: AC
  Filled 2020-10-29: qty 100

## 2020-10-29 MED ORDER — HYDROMORPHONE HCL 1 MG/ML IJ SOLN: 0.2500 mg | INTRAMUSCULAR | Status: DC | PRN

## 2020-10-29 MED ORDER — ONDANSETRON HCL 4 MG/2ML IJ SOLN
INTRAMUSCULAR | Status: AC
  Filled 2020-10-29: qty 2

## 2020-10-29 MED ORDER — STERILE WATER FOR IRRIGATION IR SOLN
Status: DC | PRN
  Administered 2020-10-29: 2000 mL

## 2020-10-29 MED ORDER — PROPOFOL 10 MG/ML IV BOLUS
INTRAVENOUS | Status: AC
  Filled 2020-10-29: qty 40

## 2020-10-29 MED ORDER — CEFAZOLIN SODIUM-DEXTROSE 2-4 GM/100ML-% IV SOLN
2.0000 g | Freq: Four times a day (QID) | INTRAVENOUS | Status: AC
  Administered 2020-10-29 (×2): 2 g via INTRAVENOUS
  Filled 2020-10-29 (×2): qty 100

## 2020-10-29 MED ORDER — POVIDONE-IODINE 10 % EX SWAB
2.0000 "application " | Freq: Once | CUTANEOUS | Status: AC
  Administered 2020-10-29: 2 via TOPICAL

## 2020-10-29 MED ORDER — ROPIVACAINE HCL 5 MG/ML IJ SOLN: INTRAMUSCULAR | Status: DC | PRN

## 2020-10-29 MED ORDER — ORAL CARE MOUTH RINSE
15.0000 mL | Freq: Once | OROMUCOSAL | Status: AC
  Administered 2020-10-29: 15 mL via OROMUCOSAL

## 2020-10-29 MED ORDER — OXYCODONE HCL 5 MG PO TABS
10.0000 mg | ORAL_TABLET | ORAL | Status: DC | PRN
  Administered 2020-10-29 – 2020-10-30 (×2): 15 mg via ORAL
  Filled 2020-10-29 (×2): qty 3

## 2020-10-29 MED ORDER — PHENOL 1.4 % MT LIQD: 1.0000 | OROMUCOSAL | Status: DC | PRN

## 2020-10-29 MED ORDER — OXYCODONE HCL 5 MG PO TABS: 5.0000 mg | ORAL_TABLET | Freq: Once | ORAL | Status: DC | PRN

## 2020-10-29 MED ORDER — BUPIVACAINE LIPOSOME 1.3 % IJ SUSP
INTRAMUSCULAR | Status: DC | PRN
  Administered 2020-10-29: 20 mL

## 2020-10-29 MED ORDER — FENTANYL CITRATE (PF) 100 MCG/2ML IJ SOLN
INTRAMUSCULAR | Status: DC | PRN
  Administered 2020-10-29 (×2): 50 ug via INTRAVENOUS

## 2020-10-29 MED ORDER — PROPOFOL 1000 MG/100ML IV EMUL
INTRAVENOUS | Status: AC
  Filled 2020-10-29: qty 100

## 2020-10-29 MED ORDER — OXYCODONE HCL 5 MG PO TABS
5.0000 mg | ORAL_TABLET | ORAL | Status: DC | PRN
  Administered 2020-10-29 – 2020-10-30 (×3): 10 mg via ORAL
  Filled 2020-10-29 (×3): qty 2

## 2020-10-29 MED ORDER — METHOCARBAMOL 500 MG PO TABS: 500.0000 mg | ORAL_TABLET | Freq: Four times a day (QID) | ORAL | Status: DC | PRN

## 2020-10-29 MED ORDER — MEPERIDINE HCL 50 MG/ML IJ SOLN: 6.2500 mg | INTRAMUSCULAR | Status: DC | PRN

## 2020-10-29 MED ORDER — SODIUM CHLORIDE (PF) 0.9 % IJ SOLN
INTRAMUSCULAR | Status: DC | PRN
  Administered 2020-10-29: 60 mL

## 2020-10-29 MED ORDER — PROPOFOL 500 MG/50ML IV EMUL
INTRAVENOUS | Status: DC | PRN
  Administered 2020-10-29: 75 ug/kg/min via INTRAVENOUS

## 2020-10-29 MED ORDER — MIDAZOLAM HCL 2 MG/2ML IJ SOLN
INTRAMUSCULAR | Status: DC | PRN
  Administered 2020-10-29: 2 mg via INTRAVENOUS

## 2020-10-29 MED ORDER — ROSUVASTATIN CALCIUM 10 MG PO TABS
10.0000 mg | ORAL_TABLET | Freq: Every day | ORAL | Status: DC
  Administered 2020-10-30: 10 mg via ORAL
  Filled 2020-10-29: qty 1

## 2020-10-29 MED ORDER — DEXAMETHASONE SODIUM PHOSPHATE 10 MG/ML IJ SOLN
INTRAMUSCULAR | Status: AC
  Filled 2020-10-29: qty 1

## 2020-10-29 MED ORDER — MIDAZOLAM HCL 2 MG/2ML IJ SOLN
INTRAMUSCULAR | Status: AC
  Filled 2020-10-29: qty 2

## 2020-10-29 MED ORDER — AMLODIPINE BESYLATE 10 MG PO TABS
10.0000 mg | ORAL_TABLET | Freq: Every day | ORAL | Status: DC
  Administered 2020-10-30: 10 mg via ORAL
  Filled 2020-10-29: qty 1

## 2020-10-29 MED ORDER — ACETAMINOPHEN 325 MG PO TABS: 325.0000 mg | ORAL_TABLET | Freq: Four times a day (QID) | ORAL | Status: DC | PRN

## 2020-10-29 MED ORDER — BUPIVACAINE LIPOSOME 1.3 % IJ SUSP: 20.0000 mL | Freq: Once | INTRAMUSCULAR | Status: DC

## 2020-10-29 MED ORDER — VENLAFAXINE HCL ER 150 MG PO CP24
150.0000 mg | ORAL_CAPSULE | Freq: Every day | ORAL | Status: DC
  Administered 2020-10-30: 150 mg via ORAL
  Filled 2020-10-29: qty 1

## 2020-10-29 MED ORDER — BISACODYL 10 MG RE SUPP: 10.0000 mg | Freq: Every day | RECTAL | Status: DC | PRN

## 2020-10-29 MED ORDER — FENTANYL CITRATE (PF) 100 MCG/2ML IJ SOLN
INTRAMUSCULAR | Status: AC
  Filled 2020-10-29: qty 2

## 2020-10-29 MED ORDER — ALBUTEROL SULFATE (2.5 MG/3ML) 0.083% IN NEBU: 2.5000 mg | INHALATION_SOLUTION | Freq: Four times a day (QID) | RESPIRATORY_TRACT | Status: DC | PRN

## 2020-10-29 MED ORDER — FLEET ENEMA 7-19 GM/118ML RE ENEM: 1.0000 | ENEMA | Freq: Once | RECTAL | Status: DC | PRN

## 2020-10-29 MED ORDER — METOCLOPRAMIDE HCL 5 MG/ML IJ SOLN: 5.0000 mg | Freq: Three times a day (TID) | INTRAMUSCULAR | Status: DC | PRN

## 2020-10-29 MED ORDER — KETOROLAC TROMETHAMINE 30 MG/ML IJ SOLN: 30.0000 mg | Freq: Once | INTRAMUSCULAR | Status: DC | PRN

## 2020-10-29 MED ORDER — DEXAMETHASONE SODIUM PHOSPHATE 10 MG/ML IJ SOLN
10.0000 mg | Freq: Once | INTRAMUSCULAR | Status: AC
  Administered 2020-10-30: 10 mg via INTRAVENOUS
  Filled 2020-10-29: qty 1

## 2020-10-29 MED ORDER — PANTOPRAZOLE SODIUM 40 MG PO TBEC
40.0000 mg | DELAYED_RELEASE_TABLET | Freq: Two times a day (BID) | ORAL | Status: DC
  Administered 2020-10-29 – 2020-10-30 (×2): 40 mg via ORAL
  Filled 2020-10-29 (×2): qty 1

## 2020-10-29 SURGICAL SUPPLY — 56 items
ATTUNE PS FEM LT SZ 3 CEM KNEE (Femur) ×2 IMPLANT
ATTUNE PSRP INSR SZ3 8 KNEE (Insert) ×2 IMPLANT
BAG COUNTER SPONGE SURGICOUNT (BAG) IMPLANT
BAG SPEC THK2 15X12 ZIP CLS (MISCELLANEOUS) ×1
BAG SPNG CNTER NS LX DISP (BAG)
BAG ZIPLOCK 12X15 (MISCELLANEOUS) ×2 IMPLANT
BASE TIBIAL ROT PLAT SZ 3 KNEE (Knees) ×1 IMPLANT
BLADE SAG 18X100X1.27 (BLADE) ×2 IMPLANT
BLADE SAW SGTL 11.0X1.19X90.0M (BLADE) ×2 IMPLANT
BNDG ELASTIC 6X5.8 VLCR STR LF (GAUZE/BANDAGES/DRESSINGS) ×2 IMPLANT
BOWL SMART MIX CTS (DISPOSABLE) ×2 IMPLANT
BSPLAT TIB 3 CMNT ROT PLAT STR (Knees) ×1 IMPLANT
CEMENT HV SMART SET (Cement) ×4 IMPLANT
CLSR STERI-STRIP ANTIMIC 1/2X4 (GAUZE/BANDAGES/DRESSINGS) ×2 IMPLANT
COVER SURGICAL LIGHT HANDLE (MISCELLANEOUS) ×2 IMPLANT
CUFF TOURN SGL QUICK 34 (TOURNIQUET CUFF) ×2
CUFF TRNQT CYL 34X4.125X (TOURNIQUET CUFF) ×1 IMPLANT
DECANTER SPIKE VIAL GLASS SM (MISCELLANEOUS) ×2 IMPLANT
DRAPE INCISE IOBAN 66X45 STRL (DRAPES) ×6 IMPLANT
DRAPE U-SHAPE 47X51 STRL (DRAPES) ×2 IMPLANT
DRSG AQUACEL AG ADV 3.5X10 (GAUZE/BANDAGES/DRESSINGS) ×2 IMPLANT
DURAPREP 26ML APPLICATOR (WOUND CARE) ×2 IMPLANT
ELECT REM PT RETURN 15FT ADLT (MISCELLANEOUS) ×2 IMPLANT
GLOVE SRG 8 PF TXTR STRL LF DI (GLOVE) ×1 IMPLANT
GLOVE SURG ENC MOIS LTX SZ6.5 (GLOVE) ×2 IMPLANT
GLOVE SURG ENC MOIS LTX SZ8 (GLOVE) ×4 IMPLANT
GLOVE SURG UNDER POLY LF SZ7 (GLOVE) ×2 IMPLANT
GLOVE SURG UNDER POLY LF SZ8 (GLOVE) ×2
GLOVE SURG UNDER POLY LF SZ8.5 (GLOVE) ×2 IMPLANT
GOWN STRL REUS W/TWL LRG LVL3 (GOWN DISPOSABLE) ×4 IMPLANT
GOWN STRL REUS W/TWL XL LVL3 (GOWN DISPOSABLE) ×2 IMPLANT
HANDPIECE INTERPULSE COAX TIP (DISPOSABLE) ×2
HOLDER FOLEY CATH W/STRAP (MISCELLANEOUS) IMPLANT
IMMOBILIZER KNEE 20 (SOFTGOODS) ×2
IMMOBILIZER KNEE 20 THIGH 36 (SOFTGOODS) ×1 IMPLANT
KIT TURNOVER KIT A (KITS) ×2 IMPLANT
MANIFOLD NEPTUNE II (INSTRUMENTS) ×2 IMPLANT
NS IRRIG 1000ML POUR BTL (IV SOLUTION) ×2 IMPLANT
PACK TOTAL KNEE CUSTOM (KITS) ×2 IMPLANT
PADDING CAST COTTON 6X4 STRL (CAST SUPPLIES) ×2 IMPLANT
PATELLA MEDIAL ATTUN 35MM KNEE (Knees) ×2 IMPLANT
PIN DRILL FIX HALF THREAD (BIT) ×2 IMPLANT
PIN STEINMAN FIXATION KNEE (PIN) ×2 IMPLANT
PROTECTOR NERVE ULNAR (MISCELLANEOUS) ×2 IMPLANT
SET HNDPC FAN SPRY TIP SCT (DISPOSABLE) ×1 IMPLANT
STRIP CLOSURE SKIN 1/2X4 (GAUZE/BANDAGES/DRESSINGS) ×4 IMPLANT
SUT MNCRL AB 4-0 PS2 18 (SUTURE) ×2 IMPLANT
SUT STRATAFIX 0 PDS 27 VIOLET (SUTURE) ×2
SUT VIC AB 2-0 CT1 27 (SUTURE) ×6
SUT VIC AB 2-0 CT1 TAPERPNT 27 (SUTURE) ×3 IMPLANT
SUTURE STRATFX 0 PDS 27 VIOLET (SUTURE) ×1 IMPLANT
TIBIAL BASE ROT PLAT SZ 3 KNEE (Knees) ×2 IMPLANT
TRAY FOLEY MTR SLVR 16FR STAT (SET/KITS/TRAYS/PACK) ×2 IMPLANT
TUBE SUCTION HIGH CAP CLEAR NV (SUCTIONS) ×2 IMPLANT
WATER STERILE IRR 1000ML POUR (IV SOLUTION) ×4 IMPLANT
WRAP KNEE MAXI GEL POST OP (GAUZE/BANDAGES/DRESSINGS) ×2 IMPLANT

## 2020-10-29 NOTE — Anesthesia Procedure Notes (Signed)
Procedure Name: MAC Date/Time: 10/29/2020 7:39 AM Performed by: Eben Burow, CRNA Pre-anesthesia Checklist: Patient identified, Emergency Drugs available, Suction available, Patient being monitored and Timeout performed Oxygen Delivery Method: Simple face mask Placement Confirmation: positive ETCO2

## 2020-10-29 NOTE — Discharge Instructions (Addendum)
 Frank Aluisio, MD Total Joint Specialist [facility] [address] [place], [place] [address] [phone]  TOTAL KNEE REPLACEMENT POSTOPERATIVE DIRECTIONS    Knee Rehabilitation, Guidelines Following Surgery  Results after knee surgery are often greatly improved when you follow the exercise, range of motion and muscle strengthening exercises prescribed by your doctor. Safety measures are also important to protect the knee from further injury. If any of these exercises cause you to have increased pain or swelling in your knee joint, decrease the amount until you are comfortable again and slowly increase them. If you have problems or questions, call your caregiver or physical therapist for advice.   BLOOD CLOT PREVENTION Take a 10 mg Xarelto once a day for three weeks following surgery. Then resume one 81 mg aspirin once a day. You may resume your vitamins/supplements once you have discontinued the Xarelto. Do not take any NSAIDs (Advil, Aleve, Ibuprofen, Meloxicam, etc.) until you have discontinued the Xarelto.   HOME CARE INSTRUCTIONS  Remove items at home which could result in a fall. This includes throw rugs or furniture in walking pathways.  ICE to the affected knee as much as tolerated. Icing helps control swelling. If the swelling is well controlled you will be more comfortable and rehab easier. Continue to use ice on the knee for pain and swelling from surgery. You may notice swelling that will progress down to the foot and ankle. This is normal after surgery. Elevate the leg when you are not up walking on it.    Continue to use the breathing machine which will help keep your temperature down. It is common for your temperature to cycle up and down following surgery, especially at night when you are not up moving around and exerting yourself. The breathing machine keeps your lungs expanded and your temperature down. Do not place pillow under the operative  knee, focus on keeping the knee straight while resting  DIET You may resume your previous home diet once you are discharged from the hospital.  DRESSING / WOUND CARE / SHOWERING Keep your bulky bandage on for 2 days. On the third post-operative day you may remove the Ace bandage and gauze. There is a waterproof adhesive bandage on your skin which will stay in place until your first follow-up appointment. Once you remove this you will not need to place another bandage You may begin showering 3 days following surgery, but do not submerge the incision under water.  ACTIVITY For the first 5 days, the key is rest and control of pain and swelling Do your home exercises twice a day starting on post-operative day 3. On the days you go to physical therapy, just do the home exercises once that day. You should rest, ice and elevate the leg for 50 minutes out of every hour. Get up and walk/stretch for 10 minutes per hour. After 5 days you can increase your activity slowly as tolerated. Walk with your walker as instructed. Use the walker until you are comfortable transitioning to a cane. Walk with the cane in the opposite hand of the operative leg. You may discontinue the cane once you are comfortable and walking steadily. Avoid periods of inactivity such as sitting longer than an hour when not asleep. This helps prevent blood clots.  You may discontinue the knee immobilizer once you are able to perform a straight leg raise while lying down. You may resume a sexual relationship in one month or when given the OK by your   doctor.  You may return to work once you are cleared by your doctor.  Do not drive a car for 6 weeks or until released by your surgeon.  Do not drive while taking narcotics.  TED HOSE STOCKINGS Wear the elastic stockings on both legs for three weeks following surgery during the day. You may remove them at night for sleeping.  WEIGHT BEARING Weight bearing as tolerated with assist device  (walker, cane, etc) as directed, use it as long as suggested by your surgeon or therapist, typically at least 4-6 weeks.  POSTOPERATIVE CONSTIPATION PROTOCOL Constipation - defined medically as fewer than three stools per week and severe constipation as less than one stool per week.  One of the most common issues patients have following surgery is constipation.  Even if you have a regular bowel pattern at home, your normal regimen is likely to be disrupted due to multiple reasons following surgery.  Combination of anesthesia, postoperative narcotics, change in appetite and fluid intake all can affect your bowels.  In order to avoid complications following surgery, here are some recommendations in order to help you during your recovery period.  Colace (docusate) - Pick up an over-the-counter form of Colace or another stool softener and take twice a day as long as you are requiring postoperative pain medications.  Take with a full glass of water daily.  If you experience loose stools or diarrhea, hold the colace until you stool forms back up. If your symptoms do not get better within 1 week or if they get worse, check with your doctor. Dulcolax (bisacodyl) - Pick up over-the-counter and take as directed by the product packaging as needed to assist with the movement of your bowels.  Take with a full glass of water.  Use this product as needed if not relieved by Colace only.  MiraLax (polyethylene glycol) - Pick up over-the-counter to have on hand. MiraLax is a solution that will increase the amount of water in your bowels to assist with bowel movements.  Take as directed and can mix with a glass of water, juice, soda, coffee, or tea. Take if you go more than two days without a movement. Do not use MiraLax more than once per day. Call your doctor if you are still constipated or irregular after using this medication for 7 days in a row.  If you continue to have problems with postoperative constipation, please  contact the office for further assistance and recommendations.  If you experience "the worst abdominal pain ever" or develop nausea or vomiting, please contact the office immediatly for further recommendations for treatment.  ITCHING If you experience itching with your medications, try taking only a single pain pill, or even half a pain pill at a time.  You can also use Benadryl over the counter for itching or also to help with sleep.   MEDICATIONS See your medication summary on the "After Visit Summary" that the nursing staff will review with you prior to discharge.  You may have some home medications which will be placed on hold until you complete the course of blood thinner medication.  It is important for you to complete the blood thinner medication as prescribed by your surgeon.  Continue your approved medications as instructed at time of discharge.  PRECAUTIONS If you experience chest pain or shortness of breath - call 911 immediately for transfer to the hospital emergency department.  If you develop a fever greater that 101 F, purulent drainage from wound, increased redness or   drainage from wound, foul odor from the wound/dressing, or calf pain - CONTACT YOUR SURGEON.                                                   FOLLOW-UP APPOINTMENTS Make sure you keep all of your appointments after your operation with your surgeon and caregivers. You should call the office at the above phone number and make an appointment for approximately two weeks after the date of your surgery or on the date instructed by your surgeon outlined in the "After Visit Summary".  RANGE OF MOTION AND STRENGTHENING EXERCISES  Rehabilitation of the knee is important following a knee injury or an operation. After just a few days of immobilization, the muscles of the thigh which control the knee become weakened and shrink (atrophy). Knee exercises are designed to build up the tone and strength of the thigh muscles and to  improve knee motion. Often times heat used for twenty to thirty minutes before working out will loosen up your tissues and help with improving the range of motion but do not use heat for the first two weeks following surgery. These exercises can be done on a training (exercise) mat, on the floor, on a table or on a bed. Use what ever works the best and is most comfortable for you Knee exercises include:  Leg Lifts - While your knee is still immobilized in a splint or cast, you can do straight leg raises. Lift the leg to 60 degrees, hold for 3 sec, and slowly lower the leg. Repeat 10-20 times 2-3 times daily. Perform this exercise against resistance later as your knee gets better.  Quad and Hamstring Sets - Tighten up the muscle on the front of the thigh (Quad) and hold for 5-10 sec. Repeat this 10-20 times hourly. Hamstring sets are done by pushing the foot backward against an object and holding for 5-10 sec. Repeat as with quad sets.  Leg Slides: Lying on your back, slowly slide your foot toward your buttocks, bending your knee up off the floor (only go as far as is comfortable). Then slowly slide your foot back down until your leg is flat on the floor again. Angel Wings: Lying on your back spread your legs to the side as far apart as you can without causing discomfort.  A rehabilitation program following serious knee injuries can speed recovery and prevent re-injury in the future due to weakened muscles. Contact your doctor or a physical therapist for more information on knee rehabilitation.   POST-OPERATIVE OPIOID TAPER INSTRUCTIONS: It is important to wean off of your opioid medication as soon as possible. If you do not need pain medication after your surgery it is ok to stop day one. Opioids include: Codeine, Hydrocodone(Norco, Vicodin), Oxycodone(Percocet, oxycontin) and hydromorphone amongst others.  Long term and even short term use of opiods can cause: Increased pain  response Dependence Constipation Depression Respiratory depression And more.  Withdrawal symptoms can include Flu like symptoms Nausea, vomiting And more Techniques to manage these symptoms Hydrate well Eat regular healthy meals Stay active Use relaxation techniques(deep breathing, meditating, yoga) Do Not substitute Alcohol to help with tapering If you have been on opioids for less than two weeks and do not have pain than it is ok to stop all together.  Plan to wean off of opioids This plan  should start within one week post op of your joint replacement. Maintain the same interval or time between taking each dose and first decrease the dose.  Cut the total daily intake of opioids by one tablet each day Next start to increase the time between doses. The last dose that should be eliminated is the evening dose.   IF YOU ARE TRANSFERRED TO A SKILLED REHAB FACILITY If the patient is transferred to a skilled rehab facility following release from the hospital, a list of the current medications will be sent to the facility for the patient to continue.  When discharged from the skilled rehab facility, please have the facility set up the patient's Home Health Physical Therapy prior to being released. Also, the skilled facility will be responsible for providing the patient with their medications at time of release from the facility to include their pain medication, the muscle relaxants, and their blood thinner medication. If the patient is still at the rehab facility at time of the two week follow up appointment, the skilled rehab facility will also need to assist the patient in arranging follow up appointment in our office and any transportation needs.  MAKE SURE YOU:  Understand these instructions.  Get help right away if you are not doing well or get worse.   DENTAL ANTIBIOTICS:  In most cases prophylactic antibiotics for Dental procdeures after total joint surgery are not  necessary.  Exceptions are as follows:  1. History of prior total joint infection  2. Severely immunocompromised (Organ Transplant, cancer chemotherapy, Rheumatoid biologic meds such as Humera)  3. Poorly controlled diabetes (A1C &gt; 8.0, blood glucose over 200)  If you have one of these conditions, contact your surgeon for an antibiotic prescription, prior to your dental procedure.    Pick up stool softner and laxative for home use following surgery while on pain medications. Do not submerge incision under water. Please use good hand washing techniques while changing dressing each day. May shower starting three days after surgery. Please use a clean towel to pat the incision dry following showers. Continue to use ice for pain and swelling after surgery. Do not use any lotions or creams on the incision until instructed by your surgeon.      Information on my medicine - XARELTO (Rivaroxaban)  Why was Xarelto prescribed for you? Xarelto was prescribed for you to reduce the risk of blood clots forming after orthopedic surgery. The medical term for these abnormal blood clots is venous thromboembolism (VTE).  What do you need to know about xarelto ? Take your Xarelto ONCE DAILY at the same time every day. You may take it either with or without food.  If you have difficulty swallowing the tablet whole, you may crush it and mix in applesauce just prior to taking your dose.  Take Xarelto exactly as prescribed by your doctor and DO NOT stop taking Xarelto without talking to the doctor who prescribed the medication.  Stopping without other VTE prevention medication to take the place of Xarelto may increase your risk of developing a clot.  After discharge, you should have regular check-up appointments with your healthcare provider that is prescribing your Xarelto.    What do you do if you miss a dose? If you miss a dose, take it as soon as you remember on the same day then  continue your regularly scheduled once daily regimen the next day. Do not take two doses of Xarelto on the same day.   Important Safety Information   A possible side effect of Xarelto is bleeding. You should call your healthcare provider right away if you experience any of the following: Bleeding from an injury or your nose that does not stop. Unusual colored urine (red or dark brown) or unusual colored stools (red or black). Unusual bruising for unknown reasons. A serious fall or if you hit your head (even if there is no bleeding).  Some medicines may interact with Xarelto and might increase your risk of bleeding while on Xarelto. To help avoid this, consult your healthcare provider or pharmacist prior to using any new prescription or non-prescription medications, including herbals, vitamins, non-steroidal anti-inflammatory drugs (NSAIDs) and supplements.  This website has more information on Xarelto: www.xarelto.com.   

## 2020-10-29 NOTE — Transfer of Care (Signed)
Immediate Anesthesia Transfer of Care Note  Patient: Leah [name]  Procedure(s) Performed: TOTAL KNEE ARTHROPLASTY (Left: Knee)  Patient Location: PACU  Anesthesia Type:Spinal  Level of Consciousness: awake, alert  and patient cooperative  Airway & Oxygen Therapy: Patient Spontanous Breathing and Patient connected to face mask oxygen  Post-op Assessment: Report given to RN and Post -op Vital signs reviewed and stable  Post vital signs: Reviewed and stable  Last Vitals:  Vitals Value Taken Time  BP 98/75 10/29/20 0850  Temp    Pulse 78 10/29/20 0852  Resp 21 10/29/20 0852  SpO2 95 % 10/29/20 0852  Vitals shown include unvalidated device data.  Last Pain:  Vitals:   10/29/20 0538  TempSrc: Oral         Complications: No notable events documented.

## 2020-10-29 NOTE — Anesthesia Procedure Notes (Addendum)
Spinal  Patient location during procedure: OR Start time: 10/29/2020 7:19 AM End time: 10/29/2020 7:29 AM Reason for block: surgical anesthesia Staffing Performed: anesthesiologist  Anesthesiologist: Pervis Hocking, DO Resident/CRNA: Eben Burow, CRNA Preanesthetic Checklist Completed: patient identified, IV checked, site marked, risks and benefits discussed, surgical consent, monitors and equipment checked, pre-op evaluation and timeout performed Spinal Block Patient position: sitting Prep: DuraPrep and site prepped and draped Patient monitoring: heart rate, cardiac monitor, continuous pulse ox and blood pressure Approach: midline Location: L4-5 Injection technique: single-shot Needle Needle type: Pencan  Needle gauge: 24 G Needle length: 9 cm Assessment Sensory level: T6 Events: CSF return and second provider Additional Notes Pt placed in sitting position, spinal kit expiration date checked and verified, attempt x 1 by CRNA, attempt x 1 by MDA. + CSF, - heme, pt tolerated well.

## 2020-10-29 NOTE — Anesthesia Postprocedure Evaluation (Signed)
Anesthesia Post Note  Patient: Leah [name]  Procedure(s) Performed: TOTAL KNEE ARTHROPLASTY (Left: Knee)     Patient location during evaluation: PACU Anesthesia Type: Regional, MAC and Spinal Level of consciousness: awake and alert and oriented Pain management: pain level controlled Vital Signs Assessment: post-procedure vital signs reviewed and stable Respiratory status: spontaneous breathing, nonlabored ventilation and respiratory function stable Cardiovascular status: blood pressure returned to baseline and stable Postop Assessment: no headache, no backache, spinal receding and no apparent nausea or vomiting Anesthetic complications: no   No notable events documented.  Last Vitals:  Vitals:   10/29/20 1043 10/29/20 1126  BP: 111/65 111/65  Pulse: 79 76  Resp:  18  Temp: 36.8 C 36.8 C  SpO2: 97%     Last Pain:  Vitals:   10/29/20 1126  TempSrc: Oral  PainSc:                  Pervis Hocking

## 2020-10-29 NOTE — Anesthesia Procedure Notes (Signed)
Anesthesia Regional Block: Adductor canal block   Pre-Anesthetic Checklist: , timeout performed,  Correct Patient, Correct Site, Correct Laterality,  Correct Procedure, Correct Position, site marked,  Risks and benefits discussed,  Surgical consent,  Pre-op evaluation,  At surgeon's request and post-op pain management  Laterality: Left  Prep: Maximum Sterile Barrier Precautions used, chloraprep       Needles:  Injection technique: Single-shot  Needle Type: Echogenic Stimulator Needle     Needle Length: 9cm  Needle Gauge: 22     Additional Needles:   Procedures:,,,, ultrasound used (permanent image in chart),,    Narrative:  Start time: 10/29/2020 6:50 AM End time: 10/29/2020 6:55 AM Injection made incrementally with aspirations every 5 mL.  Performed by: Personally  Anesthesiologist: Pervis Hocking, DO  Additional Notes: Monitors applied. No increased pain on injection. No increased resistance to injection. Injection made in 5cc increments. Good needle visualization. Patient tolerated procedure well.

## 2020-10-29 NOTE — Interval H&P Note (Signed)
History and Physical Interval Note:  10/29/2020 6:31 AM  Leah [name]  has presented today for surgery, with the diagnosis of left knee osteoarthritis.  The various methods of treatment have been discussed with the patient and family. After consideration of risks, benefits and other options for treatment, the patient has consented to  Procedure(s): TOTAL KNEE ARTHROPLASTY (Left) as a surgical intervention.  The patient's history has been reviewed, patient examined, no change in status, stable for surgery.  I have reviewed the patient's chart and labs.  Questions were answered to the patient's satisfaction.     Pilar Plate Kasai Beltran

## 2020-10-29 NOTE — Op Note (Signed)
OPERATIVE REPORT-TOTAL KNEE ARTHROPLASTY   Pre-operative diagnosis- Osteoarthritis  Left knee(s)  Post-operative diagnosis- Osteoarthritis Left knee(s)  Procedure-  Left  Total Knee Arthroplasty  Surgeon- Dione Plover. Nyellie Yetter, MD  Assistant- Molli Barrows, PA-C   Anesthesia-   Adductor canal block and spinal  EBL-25 ml   Drains None  Tourniquet time-  Total Tourniquet Time Documented: Thigh (Left) - -28 minutes Total: Thigh (Left) - -28 minutes     Complications- None  Condition-PACU - hemodynamically stable.   Brief Clinical Note  Leah [name] is a 60 y.o. year old female with end stage OA of her left knee with progressively worsening pain and dysfunction. She has constant pain, with activity and at rest and significant functional deficits with difficulties even with ADLs. She has had extensive non-op management including analgesics, injections of cortisone and viscosupplements, and home exercise program, but remains in significant pain with significant dysfunction. Radiographs show bone on bone arthritis medial and patellofemoral. She presents now for left Total Knee Arthroplasty.     Procedure in detail---   The patient is brought into the operating room and positioned supine on the operating table. After successful administration of  Adductor canal block and spinal,   a tourniquet is placed high on the  Left thigh(s) and the lower extremity is prepped and draped in the usual sterile fashion. Time out is performed by the operating team and then the  Left lower extremity is wrapped in Esmarch, knee flexed and the tourniquet inflated to 300 mmHg.       A midline incision is made with a ten blade through the subcutaneous tissue to the level of the extensor mechanism. A fresh blade is used to make a medial parapatellar arthrotomy. Soft tissue over the proximal medial tibia is subperiosteally elevated to the joint line with a knife and into the semimembranosus bursa with a Cobb  elevator. Soft tissue over the proximal lateral tibia is elevated with attention being paid to avoiding the patellar tendon on the tibial tubercle. The patella is everted, knee flexed 90 degrees and the ACL and PCL are removed. Findings are bone on bone medial and patellofemoral with large global osteophytes        The drill is used to create a starting hole in the distal femur and the canal is thoroughly irrigated with sterile saline to remove the fatty contents. The 5 degree Left  valgus alignment guide is placed into the femoral canal and the distal femoral cutting block is pinned to remove 9 mm off the distal femur. Resection is made with an oscillating saw.      The tibia is subluxed forward and the menisci are removed. The extramedullary alignment guide is placed referencing proximally at the medial aspect of the tibial tubercle and distally along the second metatarsal axis and tibial crest. The block is pinned to remove 57m off the more deficient medial  side. Resection is made with an oscillating saw. Size 3is the most appropriate size for the tibia and the proximal tibia is prepared with the modular drill and keel punch for that size.      The femoral sizing guide is placed and size 3 is most appropriate. Rotation is marked off the epicondylar axis and confirmed by creating a rectangular flexion gap at 90 degrees. The size 3 cutting block is pinned in this rotation and the anterior, posterior and chamfer cuts are made with the oscillating saw. The intercondylar block is then placed and that cut is  made.      Trial size 3 tibial component, trial size 3 posterior stabilized femur and a 8  mm posterior stabilized rotating platform insert trial is placed. Full extension is achieved with excellent varus/valgus and anterior/posterior balance throughout full range of motion. The patella is everted and thickness measured to be 21  mm. Free hand resection is taken to 12 mm, a 35 template is placed, lug holes are  drilled, trial patella is placed, and it tracks normally. Osteophytes are removed off the posterior femur with the trial in place. All trials are removed and the cut bone surfaces prepared with pulsatile lavage. Cement is mixed and once ready for implantation, the size 3 tibial implant, size  3 posterior stabilized femoral component, and the size 35 patella are cemented in place and the patella is held with the clamp. The trial insert is placed and the knee held in full extension. The Exparel (20 ml mixed with 60 ml saline) is injected into the extensor mechanism, posterior capsule, medial and lateral gutters and subcutaneous tissues.  All extruded cement is removed and once the cement is hard the permanent 8 mm posterior stabilized rotating platform insert is placed into the tibial tray.      The wound is copiously irrigated with saline solution and the extensor mechanism closed with # 0 Stratofix suture. The tourniquet is released for a total tourniquet time of 28  minutes. Flexion against gravity is 140 degrees and the patella tracks normally. Subcutaneous tissue is closed with 2.0 vicryl and subcuticular with running 4.0 Monocryl. The incision is cleaned and dried and steri-strips and a bulky sterile dressing are applied. The limb is placed into a knee immobilizer and the patient is awakened and transported to recovery in stable condition.      Please note that a surgical assistant was a medical necessity for this procedure in order to perform it in a safe and expeditious manner. Surgical assistant was necessary to retract the ligaments and vital neurovascular structures to prevent injury to them and also necessary for proper positioning of the limb to allow for anatomic placement of the prosthesis.   Dione Plover Leonarda Leis, MD    10/29/2020, 8:26 AM

## 2020-10-29 NOTE — Progress Notes (Signed)
Orthopedic Tech Progress Note Patient Details:  Leah [name] [birth year] [id number]  CPM Left Knee CPM Left Knee: On Left Knee Flexion (Degrees): 40 Left Knee Extension (Degrees): 10  Post Interventions Patient Tolerated: Well Instructions Provided: Care of device  [name] 10/29/2020, 8:58 AM

## 2020-10-29 NOTE — Evaluation (Signed)
Physical Therapy Evaluation Patient Details Name: Leah [name] MRN: [id number] DOB: [birth year] Today's Date: 10/29/2020  History of Present Illness  60 yo female s/p L TKA. PMH: HTN, DM, orthostatic hypotension, multiple surgeries--see full hx  Clinical Impression  Pt is s/p TKA resulting in the deficits listed below (see PT Problem List).  Pt amb ~ 30' with RW and min assist. Anticipate steady progress in acute setting   Pt will benefit from skilled PT to increase their independence and safety with mobility to allow discharge to the venue listed below.         Recommendations for follow up therapy are one component of a multi-disciplinary discharge planning process, led by the attending physician.  Recommendations may be updated based on patient status, additional functional criteria and insurance authorization.  Follow Up Recommendations Follow surgeon's recommendation for DC plan and follow-up therapies    Equipment Recommendations  Rolling walker with 5" wheels (youth ht)    Recommendations for Other Services       Precautions / Restrictions Precautions Precautions: Fall;Knee Required Braces or Orthoses: Knee Immobilizer - Left Knee Immobilizer - Left: Discontinue once straight leg raise with < 10 degree lag Restrictions Weight Bearing Restrictions: No      Mobility  Bed Mobility Overal bed mobility: Needs Assistance Bed Mobility: Supine to Sit     Supine to sit: Min guard;Min assist     General bed mobility comments: for safety, light assist to elevate trunk    Transfers Overall transfer level: Needs assistance Equipment used: Rolling walker (2 wheeled) Transfers: Sit to/from Stand Sit to Stand: Min assist         General transfer comment: cues for hand placement  Ambulation/Gait Ambulation/Gait assistance: Min assist;Min guard Gait Distance (Feet): 30 Feet Assistive device: Rolling walker (2 wheeled) Gait Pattern/deviations: Step-to  pattern;Decreased stance time - left     General Gait Details: cues for sequence and RW position  Stairs            Wheelchair Mobility    Modified Rankin (Stroke Patients Only)       Balance                                             Pertinent Vitals/Pain Pain Assessment: 0-10 Pain Score: 4  Pain Location: L knee Pain Descriptors / Indicators: Grimacing;Sore Pain Intervention(s): Limited activity within patient's tolerance;Monitored during session;Premedicated before session;Repositioned    Home Living Family/patient expects to be discharged to:: Private residence Living Arrangements: Spouse/significant other;Children Available Help at Discharge: Family;Available 24 hours/day Type of Home: House Home Access: Stairs to enter Entrance Stairs-Rails: Right Entrance Stairs-Number of Steps: 3 Home Layout: One level Home Equipment: Walker - 4 wheels;Cane - single point      Prior Function Level of Independence: Independent;Needs assistance   Gait / Transfers Assistance Needed: assist from family at times           Hand Dominance        Extremity/Trunk Assessment   Upper Extremity Assessment Upper Extremity Assessment: Overall WFL for tasks assessed    Lower Extremity Assessment Lower Extremity Assessment: LLE deficits/detail LLE Deficits / Details: ankle WFL, knee extension and hip flexion grossly 2+/5, limited by post op pain and weakness       Communication   Communication: No difficulties  Cognition Arousal/Alertness: Awake/alert Behavior During Therapy: [facility] for tasks  assessed/performed Overall Cognitive Status: Within Functional Limits for tasks assessed                                        General Comments      Exercises Total Joint Exercises Ankle Circles/Pumps: AROM;10 reps;Both Quad Sets: AROM;Both;5 reps   Assessment/Plan    PT Assessment Patient needs continued PT services  PT Problem List  Decreased strength;Decreased mobility;Decreased range of motion;Decreased activity tolerance;Decreased knowledge of use of DME;Pain       PT Treatment Interventions Functional mobility training;DME instruction;Therapeutic activities;Therapeutic exercise;Gait training;Patient/family education;Stair training    PT Goals (Current goals can be found in the Care Plan section)  Acute Rehab PT Goals Patient Stated Goal: be more active PT Goal Formulation: With patient Time For Goal Achievement: 11/05/20 Potential to Achieve Goals: Good    Frequency 7X/week   Barriers to discharge        Co-evaluation               AM-PAC PT "6 Clicks" Mobility  Outcome Measure Help needed turning from your back to your side while in a flat bed without using bedrails?: A Little Help needed moving from lying on your back to sitting on the side of a flat bed without using bedrails?: A Little Help needed moving to and from a bed to a chair (including a wheelchair)?: A Little Help needed standing up from a chair using your arms (e.g., wheelchair or bedside chair)?: A Little Help needed to walk in hospital room?: A Little Help needed climbing 3-5 steps with a railing? : A Little 6 Click Score: 18    End of Session Equipment Utilized During Treatment: Gait belt Activity Tolerance: Patient tolerated treatment well Patient left: with call bell/phone within reach;in chair;with chair alarm set   PT Visit Diagnosis: Other abnormalities of gait and mobility (R26.89);Difficulty in walking, not elsewhere classified (R26.2)    Time: [id number] PT Time Calculation (min) (ACUTE ONLY): 25 min   Charges:   PT Evaluation $PT Eval Low Complexity: 1 Low PT Treatments $Gait Training: 8-22 mins        Baxter Flattery, PT  Acute Rehab Dept ([place]) [phone] Pager [phone]  10/29/2020   [facility] 10/29/2020, 4:42 PM

## 2020-10-29 NOTE — Plan of Care (Signed)
  Problem: Activity: Goal: Ability to avoid complications of mobility impairment will improve Outcome: Progressing Goal: Range of joint motion will improve Outcome: Progressing   Problem: Pain Management: Goal: Pain level will decrease with appropriate interventions Outcome: Progressing   

## 2020-10-30 ENCOUNTER — Encounter (HOSPITAL_COMMUNITY): Payer: Self-pay | Admitting: Orthopedic Surgery

## 2020-10-30 DIAGNOSIS — M1712 Unilateral primary osteoarthritis, left knee: Secondary | ICD-10-CM | POA: Diagnosis not present

## 2020-10-30 LAB — BASIC METABOLIC PANEL
Anion gap: 8 (ref 5–15)
BUN: 11 mg/dL (ref 8–23)
CO2: 24 mmol/L (ref 22–32)
Calcium: 8.5 mg/dL — ABNORMAL LOW (ref 8.9–10.3)
Chloride: 104 mmol/L (ref 98–111)
Creatinine, Ser: 0.65 mg/dL (ref 0.44–1.00)
GFR, Estimated: >60 mL/min (ref >60)
Glucose, Bld: 212 mg/dL — ABNORMAL HIGH (ref 70–99)
Potassium: 3.9 mmol/L (ref 3.5–5.1)
Sodium: 136 mmol/L (ref 135–145)

## 2020-10-30 LAB — CBC
HCT: 32 % — ABNORMAL LOW (ref 36.0–46.0)
Hemoglobin: 10.6 g/dL — ABNORMAL LOW (ref 12.0–15.0)
MCH: 29.5 pg (ref 26.0–34.0)
MCHC: 33.1 g/dL (ref 30.0–36.0)
MCV: 89.1 fL (ref 80.0–100.0)
Platelets: 225 10*3/uL (ref 150–400)
RBC: 3.59 MIL/uL — ABNORMAL LOW (ref 3.87–5.11)
RDW: 13.3 % (ref 11.5–15.5)
WBC: 12.9 10*3/uL — ABNORMAL HIGH (ref 4.0–10.5)
nRBC: 0 % (ref 0.0–0.2)

## 2020-10-30 MED ORDER — OXYCODONE HCL 5 MG PO TABS: 5.0000 mg | ORAL_TABLET | Freq: Four times a day (QID) | ORAL | 0 refills | Status: DC | PRN

## 2020-10-30 MED ORDER — RIVAROXABAN 10 MG PO TABS: 10.0000 mg | ORAL_TABLET | Freq: Every day | ORAL | 0 refills | Status: DC

## 2020-10-30 MED ORDER — METHOCARBAMOL 500 MG PO TABS: 500.0000 mg | ORAL_TABLET | Freq: Four times a day (QID) | ORAL | 0 refills | Status: DC | PRN

## 2020-10-30 NOTE — Progress Notes (Signed)
Physical Therapy Treatment Patient Details Name: Leah [name] MRN: [id number] DOB: 19-Apr-1960 Today's Date: 10/30/2020   History of Present Illness 60 yo female s/p L TKA. PMH: HTN, DM, orthostatic hypotension, multiple surgeries--see full hx    PT Comments    POD # 1 pm session part 2 Assisted OOB to amb in hallway and practice stairs.  General bed mobility comments: Pt self able with using her belt to assist L LE on/off bed.  General transfer comment: <25% VC on proper hand placement with sit to stand and stand to sit as well as safety with turn completion.General Gait Details: tolerated a functional distance.  No longer needing KI.  Step to gait.General stair comments: with walker up forward with spouse also holding walker.  50% VC on proper sequencing and safety using the walker as B rails. Pt has met goals to D/C to home.   Recommendations for follow up therapy are one component of a multi-disciplinary discharge planning process, led by the attending physician.  Recommendations may be updated based on patient status, additional functional criteria and insurance authorization.  Follow Up Recommendations        Equipment Recommendations       Recommendations for Other Services       Precautions / Restrictions Precautions Precautions: Fall;Knee Precaution Comments: instructed no pillow under knee/no longer needing KI Restrictions Weight Bearing Restrictions: No LLE Weight Bearing: Weight bearing as tolerated     Mobility  Bed Mobility Overal bed mobility: Modified Independent Bed Mobility: Supine to Sit;Sit to Supine           General bed mobility comments: Pt self able with using her belt to assist L LE on/off bed    Transfers Overall transfer level: Needs assistance Equipment used: Rolling walker (2 wheeled) Transfers: Sit to/from [employer] Sit to Stand: Supervision Stand pivot transfers: Supervision       General transfer comment: <25%  VC on proper hand placement with sit to stand and stand to sit as well as safety with turn completion.  Ambulation/Gait Ambulation/Gait assistance: Supervision;Min guard Gait Distance (Feet): 28 Feet Assistive device: Rolling walker (2 wheeled) Gait Pattern/deviations: Step-to pattern;Decreased stance time - left Gait velocity: decreased   General Gait Details: tolerated a functional distance.  No longer needing KI.  Step to gait.   Stairs Stairs: Yes Stairs assistance: Min assist Stair Management: No rails;Step to pattern;Forwards Number of Stairs: 2 General stair comments: with walker up forward with spouse also holding walker.  50% VC on proper sequencing and safety using the walker as B rails.   Wheelchair Mobility    Modified Rankin (Stroke Patients Only)       Balance                                            Cognition Arousal/Alertness: Awake/alert Behavior During Therapy: WFL for tasks assessed/performed Overall Cognitive Status: Within Functional Limits for tasks assessed                                 General Comments: AxO x 3 very pleasant      Exercises      General Comments        Pertinent Vitals/Pain Pain Assessment: 0-10 Pain Score: 6  Pain Location: L knee Pain Descriptors / Indicators:  Grimacing;Sore;Crying Pain Intervention(s): Monitored during session;Premedicated before session;Repositioned;Ice applied    Home Living                      Prior Function            PT Goals (current goals can now be found in the care plan section) Progress towards PT goals: Progressing toward goals    Frequency           PT Plan      Co-evaluation              AM-PAC PT "6 Clicks" Mobility   Outcome Measure                   End of Session Equipment Utilized During Treatment: Gait belt Activity Tolerance: Patient tolerated treatment well Patient left: with family/visitor  present;in bed;with bed alarm set;with call bell/phone within reach Nurse Communication:  (pt ready for D/C to home)       Time: 1610-9604 PT Time Calculation (min) (ACUTE ONLY): 25 min  Charges:  $Gait Training: 8-22 mins $Therapeutic Activity: 8-22 mins                     Rica Koyanagi  PTA Acute  Rehabilitation Services Pager      [phone] Office      [phone]

## 2020-10-30 NOTE — Progress Notes (Signed)
10/30/20 1100  PT Visit Information  Last PT Received On 10/30/20   Returned to see pt while pain medicine in full effect and d/t pt and husband requesting earlier d/c (bc pt husband has to go to work later today) Pt transferred to [facility] and then went back to bed, declining any further mobility at this time. Pt wanting  to get in bed and "close her eyes, and nothing else " Will see again if schedule allows, unsure of d/c today   Assistance Needed +1  History of Present Illness 60 yo female s/p L TKA. PMH: HTN, DM, orthostatic hypotension, multiple surgeries--see full hx  Precautions  Precautions Fall;Knee  Required Braces or Orthoses Knee Immobilizer - Left  Knee Immobilizer - Left Discontinue once straight leg raise with < 10 degree lag  Restrictions  LLE Weight Bearing WBAT  Pain Assessment  Pain Assessment 0-10  Pain Score 5  Pain Location L knee  Pain Descriptors / Indicators Grimacing;Sore;Crying  Pain Intervention(s) Limited activity within patient's tolerance;Monitored during session;Premedicated before session;Repositioned  Cognition  Arousal/Alertness Awake/alert  Behavior During Therapy WFL for tasks assessed/performed  Overall Cognitive Status Within Functional Limits for tasks assessed  Bed Mobility  Overal bed mobility Needs Assistance  Bed Mobility Sit to Supine  Sit to supine Min guard;Supervision  General bed mobility comments for safety, assist to elevate trunk adn scoot to EOB, husband able to provide assist if needed  Transfers  Overall transfer level Needs assistance  Equipment used Rolling walker (2 wheeled)  Transfers Sit to/from [employer] Transfers  Sit to Stand Min assist;Min guard  Stand pivot transfers Min guard  General transfer comment cues for hand placement and LLE position; bed <> BSC stand pivot with min/guard for safety  Ambulation/Gait  General Gait Details pt declines any further activity  Total Joint Exercises  Quad Sets  (reviewed  HEP with pt husband, pt declines)  PT - End of Session  Equipment Utilized During Treatment Gait belt  Activity Tolerance Patient tolerated treatment well  Patient left with family/visitor present;in bed;with bed alarm set;with call bell/phone within reach   PT - Assessment/Plan  PT Plan Current plan remains appropriate  PT Visit Diagnosis Other abnormalities of gait and mobility (R26.89);Difficulty in walking, not elsewhere classified (R26.2)  PT Frequency (ACUTE ONLY) 7X/week  Follow Up Recommendations Follow surgeon's recommendation for DC plan and follow-up therapies  PT equipment Rolling walker with 5" wheels (youth ht)  AM-PAC PT "6 Clicks" Mobility Outcome Measure (Version 2)  Help needed turning from your back to your side while in a flat bed without using bedrails? 3  Help needed moving from lying on your back to sitting on the side of a flat bed without using bedrails? 3  Help needed moving to and from a bed to a chair (including a wheelchair)? 3  Help needed standing up from a chair using your arms (e.g., wheelchair or bedside chair)? 3  Help needed to walk in hospital room? 3  Help needed climbing 3-5 steps with a railing?  3  6 Click Score 18  Consider Recommendation of Discharge To: Home with [facility]  PT Goal Progression  Progress towards PT goals Progressing toward goals  Acute Rehab PT Goals  PT Goal Formulation With patient  Time For Goal Achievement 11/05/20  Potential to Achieve Goals Good  PT Time Calculation  PT Start Time (ACUTE ONLY) 1125  PT Stop Time (ACUTE ONLY) 1146  PT Time Calculation (min) (ACUTE ONLY) 21 min  PT General Charges  $$ ACUTE PT VISIT 1 Visit  PT Treatments  $Therapeutic Activity 8-22 mins

## 2020-10-30 NOTE — TOC Transition Note (Signed)
Transition of Care [facility]) - CM/SW Discharge Note   Patient Details  Name: Leah [name] MRN: [id number] Date of Birth: 07-31-1960  [facility] CM/SW Contact:  Lennart Pall, LCSW Phone Number: 10/30/2020, 9:39 AM   Clinical Narrative:    Met with pt/ spouse and confirming pt needs a rolling walker - ordered via [place] for delivery to room.  Plan for OPPT at [facility].  NO further TOC needs.   Final next level of care: OP Rehab Barriers to Discharge: No Barriers Identified   Patient Goals and CMS Choice Patient states their goals for this hospitalization and ongoing recovery are:: return home      Discharge Placement                       Discharge Plan and Services                DME Arranged: Walker rolling DME Agency: [employer] Date DME Agency Contacted: 10/30/20 Time DME Agency Contacted: [phone] Representative spoke with at DME Agency: [employer] ([place]) Interventions     Readmission Risk Interventions No flowsheet data found.

## 2020-10-30 NOTE — Progress Notes (Signed)
Physical Therapy Treatment Patient Details Name: Leah [name] MRN: [id number] DOB: [birth year] Today's Date: 10/30/2020   History of Present Illness 60 yo female s/p L TKA. PMH: HTN, DM, orthostatic hypotension, multiple surgeries--see full hx    PT Comments    Pt progressing although limited by pain this am. Will see for a second session with goal to d/c early afternoon. Pt remains motivated to d/c today    Recommendations for follow up therapy are one component of a multi-disciplinary discharge planning process, led by the attending physician.  Recommendations may be updated based on patient status, additional functional criteria and insurance authorization.  Follow Up Recommendations  Follow surgeon's recommendation for DC plan and follow-up therapies     Equipment Recommendations  Rolling walker with 5" wheels (youth ht)    Recommendations for Other Services       Precautions / Restrictions Precautions Precautions: Fall;Knee Required Braces or Orthoses: Knee Immobilizer - Left Knee Immobilizer - Left: Discontinue once straight leg raise with < 10 degree lag Restrictions Weight Bearing Restrictions: No LLE Weight Bearing: Weight bearing as tolerated     Mobility  Bed Mobility Overal bed mobility: Needs Assistance Bed Mobility: Supine to Sit     Supine to sit: Min assist     General bed mobility comments: for safety, assist to elevate trunk adn scoot to EOB, husband able to provide assist if needed    Transfers Overall transfer level: Needs assistance Equipment used: Rolling walker (2 wheeled) Transfers: Sit to/from Stand Sit to Stand: Min assist         General transfer comment: cues for hand placement and LLE position  Ambulation/Gait Ambulation/Gait assistance: Min assist;Min guard Gait Distance (Feet): 15 Feet (x2) Assistive device: Rolling walker (2 wheeled) Gait Pattern/deviations: Step-to pattern;Decreased stance time - left     General Gait  Details: cues for sequence and RW position   Stairs             Wheelchair Mobility    Modified Rankin (Stroke Patients Only)       Balance                                            Cognition Arousal/Alertness: Awake/alert Behavior During Therapy: WFL for tasks assessed/performed Overall Cognitive Status: Within Functional Limits for tasks assessed                                        Exercises Total Joint Exercises Quad Sets:  (deferred d/t pain)    General Comments        Pertinent Vitals/Pain Pain Assessment: 0-10 Pain Score: 10-Worst pain ever Pain Location: L knee Pain Descriptors / Indicators: Grimacing;Sore;Crying Pain Intervention(s): Limited activity within patient's tolerance;Monitored during session;Premedicated before session;Repositioned;Ice applied    Home Living                      Prior Function            PT Goals (current goals can now be found in the care plan section) Acute Rehab PT Goals PT Goal Formulation: With patient Time For Goal Achievement: 11/05/20 Potential to Achieve Goals: Good Progress towards PT goals: Progressing toward goals    Frequency    7X/week  PT Plan Current plan remains appropriate    Co-evaluation              AM-PAC PT "6 Clicks" Mobility   Outcome Measure  Help needed turning from your back to your side while in a flat bed without using bedrails?: A Little Help needed moving from lying on your back to sitting on the side of a flat bed without using bedrails?: A Little Help needed moving to and from a bed to a chair (including a wheelchair)?: A Little Help needed standing up from a chair using your arms (e.g., wheelchair or bedside chair)?: A Little Help needed to walk in hospital room?: A Little Help needed climbing 3-5 steps with a railing? : A Little 6 Click Score: 18    End of Session Equipment Utilized During Treatment: Gait  belt Activity Tolerance: Patient tolerated treatment well Patient left: with call bell/phone within reach;in chair;with chair alarm set;with family/visitor present   PT Visit Diagnosis: Other abnormalities of gait and mobility (R26.89);Difficulty in walking, not elsewhere classified (R26.2)     Time: 1281-1886 PT Time Calculation (min) (ACUTE ONLY): 28 min  Charges:  $Gait Training: 23-37 mins                     Baxter Flattery, PT  Acute Rehab Dept ([place]) [phone] Pager [phone]  10/30/2020    [facility] 10/30/2020, 9:40 AM

## 2020-10-30 NOTE — Progress Notes (Signed)
Subjective: 1 Day Post-Op Procedure(s) (LRB): TOTAL KNEE ARTHROPLASTY (Left) Patient reports pain as mild.   Patient seen in rounds by Dr. Wynelle Link. Patient is well, and has had no acute complaints or problems. Denies chest pain or SOB. No issues overnight, foley catheter removed this AM. We will continue therapy today, ambulated 30' yesterday.   Objective: Vital signs in last 24 hours: Temp:  [97.7 F (36.5 C)-98.7 F (37.1 C)] 98.7 F (37.1 C) (09/20 0101) Pulse Rate:  [71-108] 108 (09/20 0101) Resp:  [15-23] 17 (09/20 0101) BP: (98-149)/(65-80) 149/80 (09/20 0101) SpO2:  [91 %-99 %] 93 % (09/20 0101) FiO2 (%):  [28 %] 28 % (09/19 0847) Weight:  [90 kg] 90 kg (09/19 1126)  Intake/Output from previous day:  Intake/Output Summary (Last 24 hours) at 10/30/2020 0715 Last data filed at 10/30/2020 0504 Gross per 24 hour  Intake 2566.31 ml  Output 1975 ml  Net 591.31 ml     Intake/Output this shift: No intake/output data recorded.  Labs: Recent Labs    10/30/20 0329  HGB 10.6*   Recent Labs    10/30/20 0329  WBC 12.9*  RBC 3.59*  HCT 32.0*  PLT 225   Recent Labs    10/30/20 0329  NA 136  K 3.9  CL 104  CO2 24  BUN 11  CREATININE 0.65  GLUCOSE 212*  CALCIUM 8.5*   No results for input(s): LABPT, INR in the last 72 hours.  Exam: General - Patient is Alert and Oriented Extremity - Neurologically intact Neurovascular intact Sensation intact distally Dorsiflexion/Plantar flexion intact Dressing - dressing C/D/I Motor Function - intact, moving foot and toes well on exam.   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[place])    Constipation    Diabetes ([place])    Dyspnea    Fatty liver    Gallbladder problem    GE reflux    Hepatic encephalopathy ([place]) 2011   History of PCR DNA positive for HSV1    History of retinal vein occlusion,  right eye    HTN (hypertension) 2011   Joint pain    Liver problem    Migraines    Neuroendocrine tumor 2011   Orthostatic hypotension    Osteoarthritis    Osteopenia    Palpitations    Sarcoidosis 2008   Sleep apnea    uses cpap   Surgical menopause     Assessment/Plan: 1 Day Post-Op Procedure(s) (LRB): TOTAL KNEE ARTHROPLASTY (Left) Principal Problem:   OA (osteoarthritis) of knee Active Problems:   Primary osteoarthritis of left knee  Estimated body mass index is 40.07 kg/m as calculated from the following:   Height as of this encounter: 4\' 11"  (1.499 m).   Weight as of this encounter: 90 kg. Advance diet Up with therapy D/C IV fluids   Patient's anticipated LOS is less than 2 midnights, meeting these requirements: - Younger than 110 - Lives within 1 hour of care - Has a competent adult at home to recover with post-op recover - NO history of  - Chronic pain requiring opioids  - Diabetes  - Coronary Artery Disease  - Heart failure  - Heart attack  - Stroke  - DVT/VTE  - Cardiac arrhythmia  - Respiratory Failure/COPD  - Renal failure  - Anemia  DVT Prophylaxis - Xarelto Weight bearing  as tolerated. Continue therapy.  Plan is to go Home after hospital stay. Plan for discharge later today if progresses with therapy and meeting goals. Scheduled for OPPT at [facility] Follow-up in the office in 2 weeks  The PDMP database was reviewed today prior to any opioid medications being prescribed to this patient.   Theresa Duty, PA-C Orthopedic Surgery [phone] 10/30/2020, 7:15 AM

## 2020-10-31 DIAGNOSIS — M25562 Pain in left knee: Secondary | ICD-10-CM | POA: Insufficient documentation

## 2020-11-05 NOTE — Discharge Summary (Signed)
Physician Discharge Summary   Patient ID: MAURYA NETHERY MRN: [id number] DOB/AGE: 1960/02/13 59 y.o.  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[place])    Constipation    Diabetes ([place])    Dyspnea    Fatty liver    Gallbladder problem    GE reflux    Hepatic encephalopathy ([place]) 2011   History of PCR DNA positive for HSV1    History of retinal vein occlusion, right eye    HTN (hypertension) 2011   Joint pain    Liver problem    Migraines    Neuroendocrine tumor 2011   Orthostatic hypotension    Osteoarthritis    Osteopenia    Palpitations    Sarcoidosis 2008   Sleep apnea    uses cpap   Surgical menopause    Discharge Diagnoses:   Principal Problem:   OA (osteoarthritis) of knee Active Problems:   Primary osteoarthritis of left knee  Estimated body mass index is 40.07 kg/m as calculated from the following:   Height as of this encounter: 4\' 11"  (1.499 m).   Weight as of this encounter: 90 kg.  Procedure:  Procedure(s) (LRB): TOTAL KNEE ARTHROPLASTY (Left)   Consults: None  HPI: Leah [name] is a 60 y.o. year old female with end stage OA of her left knee with progressively worsening pain and dysfunction. She has constant pain, with activity and at rest and significant functional deficits with difficulties even with ADLs. She has had extensive non-op management including analgesics, injections of cortisone and viscosupplements, and home exercise program, but remains in significant pain with significant dysfunction. Radiographs show bone on bone arthritis medial and patellofemoral. She presents now for left Total Knee Arthroplasty.     Laboratory Data: Admission on 10/29/2020, Discharged on 10/30/2020  Component Date Value Ref  Range Status   Glucose-Capillary 10/29/2020 112 (A) 70 - 99 mg/dL Final   Glucose reference range applies only to samples taken after fasting for at least 8 hours.   WBC 10/30/2020 12.9 (A) 4.0 - 10.5 K/uL Final   RBC 10/30/2020 3.59 (A) 3.87 - 5.11 MIL/uL Final   Hemoglobin 10/30/2020 10.6 (A) 12.0 - 15.0 g/dL Final   HCT 10/30/2020 32.0 (A) 36.0 - 46.0 % Final   MCV 10/30/2020 89.1  80.0 - 100.0 fL Final   MCH 10/30/2020 29.5  26.0 - 34.0 pg Final   MCHC 10/30/2020 33.1  30.0 - 36.0 g/dL Final   RDW 10/30/2020 13.3  11.5 - 15.5 % Final   Platelets 10/30/2020 225  150 - 400 K/uL Final   nRBC 10/30/2020 0.0  0.0 - 0.2 % Final   Performed at [facility], [place] [address] [place], [place] [address]   Sodium 10/30/2020 136  135 - 145 mmol/L Final   Potassium 10/30/2020 3.9  3.5 - 5.1 mmol/L Final   Chloride 10/30/2020 104  98 - 111 mmol/L Final   CO2 10/30/2020 24  22 - 32 mmol/L Final   Glucose, Bld 10/30/2020 212 (A) 70 - 99 mg/dL Final   Glucose reference range applies only to samples taken after fasting for at least 8 hours.   BUN 10/30/2020 11  8 - 23 mg/dL Final   Creatinine, Ser 10/30/2020  0.65  0.44 - 1.00 mg/dL Final   Calcium 10/30/2020 8.5 (A) 8.9 - 10.3 mg/dL Final   GFR, Estimated 10/30/2020 >60  >60 mL/min Final   Comment: (NOTE) Calculated using the CKD-EPI Creatinine Equation (2021)    Anion gap 10/30/2020 8  5 - 15 Final   Performed at [facility], [place] [address] [place], [place] [address]  Orders Only on 10/25/2020  Component Date Value Ref Range Status   SARS Coronavirus 2 10/25/2020 RESULT: NEGATIVE   Final   Comment: RESULT: NEGATIVESARS-CoV-2 INTERPRETATION:A NEGATIVE  test result means that SARS-CoV-2 RNA was not present in the specimen above the limit of detection of this test. This does not preclude a possible SARS-CoV-2 infection and should not be used as the  sole basis for patient management decisions. Negative results  must be combined with clinical observations, patient history, and epidemiological information. Optimum specimen types and timing for peak viral levels during infections caused by SARS-CoV-2  have not been determined. Collection of multiple specimens or types of specimens may be necessary to detect virus. Improper specimen collection and handling, sequence variability under primers/probes, or organism present below the limit of detection may  lead to false negative results. Positive and negative predictive values of testing are highly dependent on prevalence. False negative test results are more likely when prevalence of disease is high.The expected result is NEGATIVE.Fact S                          heet for  Healthcare Providers: LocalChronicle.no Sheet for Patients: SalonLookup.es Reference Range - Negative   Hospital Outpatient Visit on 10/17/2020  Component Date Value Ref Range Status   MRSA, PCR 10/17/2020 NEGATIVE  NEGATIVE Final   Staphylococcus aureus 10/17/2020 NEGATIVE  NEGATIVE Final   Comment: (NOTE) The Xpert SA Assay (FDA approved for NASAL specimens in patients 13 years of age and older), is one component of a comprehensive surveillance program. It is not intended to diagnose infection nor to guide or monitor treatment. Performed at [facility], [place] [address] [place], [place] [address]    WBC 10/17/2020 9.6  4.0 - 10.5 K/uL Final   RBC 10/17/2020 4.48  3.87 - 5.11 MIL/uL Final   Hemoglobin 10/17/2020 13.3  12.0 - 15.0 g/dL Final   HCT 10/17/2020 41.7  36.0 - 46.0 % Final   MCV 10/17/2020 93.1  80.0 - 100.0 fL Final   MCH 10/17/2020 29.7  26.0 - 34.0 pg Final   MCHC 10/17/2020 31.9  30.0 - 36.0 g/dL Final   RDW 10/17/2020 13.6  11.5 - 15.5 % Final   Platelets 10/17/2020 306  150 - 400 K/uL Final   nRBC 10/17/2020 0.0  0.0 - 0.2 % Final   Performed at [name], [place]  [address] [place], [place] [address]   Sodium 10/17/2020 139  135 - 145 mmol/L Final   Potassium 10/17/2020 4.9  3.5 - 5.1 mmol/L Final   Chloride 10/17/2020 107  98 - 111 mmol/L Final   CO2 10/17/2020 25  22 - 32 mmol/L Final   Glucose, Bld 10/17/2020 105 (A) 70 - 99 mg/dL Final   Glucose reference range applies only to samples taken after fasting for at least 8 hours.   BUN 10/17/2020 19  8 - 23 mg/dL Final   Creatinine, Ser 10/17/2020 0.83  0.44 - 1.00 mg/dL Final   Calcium 10/17/2020 9.5  8.9 - 10.3 mg/dL Final  Total Protein 10/17/2020 7.9  6.5 - 8.1 g/dL Final   Albumin 10/17/2020 4.0  3.5 - 5.0 g/dL Final   AST 10/17/2020 23  15 - 41 U/L Final   ALT 10/17/2020 20  0 - 44 U/L Final   Alkaline Phosphatase 10/17/2020 107  38 - 126 U/L Final   Total Bilirubin 10/17/2020 0.6  0.3 - 1.2 mg/dL Final   GFR, Estimated 10/17/2020 >60  >60 mL/min Final   Comment: (NOTE) Calculated using the CKD-EPI Creatinine Equation (2021)    Anion gap 10/17/2020 7  5 - 15 Final   Performed at [facility], [place] [address] [place], [place] [address]   ABO/RH(D) 10/17/2020 A POS   Final   Antibody Screen 10/17/2020 NEG   Final   Sample Expiration 10/17/2020 10/31/2020,2359   Final   Extend sample reason 10/17/2020    Final                   Value:NO TRANSFUSIONS OR PREGNANCY IN THE PAST 3 MONTHS Performed at [facility], [place] [address], [place], [place] [address]    Prothrombin Time 10/17/2020 13.7  11.4 - 15.2 seconds Final   INR 10/17/2020 1.0  0.8 - 1.2 Final   Comment: (NOTE) INR goal varies based on device and disease states. Performed at [facility], [place] [address] [place], [place] [address]    Hgb A1c MFr Bld 10/17/2020 5.8 (A) 4.8 - 5.6 % Final   Comment: (NOTE) Pre diabetes:          5.7%-6.4%  Diabetes:              >6.4%  Glycemic control for   <7.0% adults with diabetes    Mean Plasma Glucose 10/17/2020 119.76  mg/dL  Final   Performed at [facility], [place] [address], [place], [place] [address]   Glucose-Capillary 10/17/2020 107 (A) 70 - 99 mg/dL Final   Glucose reference range applies only to samples taken after fasting for at least 8 hours.     X-Rays:No results found.  EKG: Orders placed or performed during the hospital encounter of 10/17/20   EKG 12 lead per protocol   EKG 12 lead per protocol   *Note: Due to a large number of results and/or encounters for the requested time period, some results have not been displayed. A complete set of results can be found in Results Review.     Hospital Course: MELLA INCLAN is a 60 y.o. who was admitted to [facility]. They were brought to the operating room on 10/29/2020 and underwent Procedure(s): TOTAL KNEE ARTHROPLASTY.  Patient tolerated the procedure well and was later transferred to the recovery room and then to the orthopaedic floor for postoperative care. They were given PO and IV analgesics for pain control following their surgery. They were given 24 hours of postoperative antibiotics of  Anti-infectives (From admission, onward)    Start     Dose/Rate Route Frequency Ordered Stop   10/29/20 2200  valACYclovir (VALTREX) tablet 1,000 mg  Status:  Discontinued        1,000 mg Oral 2 times daily 10/29/20 1049 10/30/20 1928   10/29/20 1330  ceFAZolin (ANCEF) IVPB 2g/100 mL premix        2 g 200 mL/hr over 30 Minutes Intravenous Every 6 hours 10/29/20 1049 10/29/20 2029   10/29/20 0600  ceFAZolin (ANCEF) IVPB 2g/100 mL premix  2 g 200 mL/hr over 30 Minutes Intravenous On call to O.R. 10/29/20 0515 10/29/20 0730   10/29/20 0525  ceFAZolin (ANCEF) 2-4 GM/100ML-% IVPB       Note to Pharmacy: Charmayne Sheer   : cabinet override      10/29/20 0525 10/29/20 0733      and started on DVT prophylaxis in the form of Xarelto.   PT and OT were ordered for total joint protocol. Discharge planning consulted to help with postop disposition and  equipment needs.  Patient had a good night on the evening of surgery. They started to get up OOB with therapy on POD #0. Pt was seen during rounds and was ready to go home pending progress with therapy. She worked with therapy on POD #1 and was meeting her goals. Pt was discharged to home later that day in stable condition.  Diet: Regular diet Activity: WBAT Follow-up: in 2 weeks Disposition: Home with OPPT Discharged Condition: stable   Discharge Instructions     Call MD / Call 911   Complete by: As directed    If you experience chest pain or shortness of breath, CALL 911 and be transported to the hospital emergency room.  If you develope a fever above 101 F, pus (white drainage) or increased drainage or redness at the wound, or calf pain, call your surgeon's office.   Change dressing   Complete by: As directed    You may remove the bulky bandage (ACE wrap and gauze) two days after surgery. You will have an adhesive waterproof bandage underneath. Leave this in place until your first follow-up appointment.   Constipation Prevention   Complete by: As directed    Drink plenty of fluids.  Prune juice may be helpful.  You may use a stool softener, such as Colace (over the counter) 100 mg twice a day.  Use MiraLax (over the counter) for constipation as needed.   Diet - low sodium heart healthy   Complete by: As directed    Do not put a pillow under the knee. Place it under the heel.   Complete by: As directed    Driving restrictions   Complete by: As directed    No driving for two weeks   Post-operative opioid taper instructions:   Complete by: As directed    POST-OPERATIVE OPIOID TAPER INSTRUCTIONS: It is important to wean off of your opioid medication as soon as possible. If you do not need pain medication after your surgery it is ok to stop day one. Opioids include: Codeine, Hydrocodone(Norco, Vicodin), Oxycodone(Percocet, oxycontin) and hydromorphone amongst others.  Long term and  even short term use of opiods can cause: Increased pain response Dependence Constipation Depression Respiratory depression And more.  Withdrawal symptoms can include Flu like symptoms Nausea, vomiting And more Techniques to manage these symptoms Hydrate well Eat regular healthy meals Stay active Use relaxation techniques(deep breathing, meditating, yoga) Do Not substitute Alcohol to help with tapering If you have been on opioids for less than two weeks and do not have pain than it is ok to stop all together.  Plan to wean off of opioids This plan should start within one week post op of your joint replacement. Maintain the same interval or time between taking each dose and first decrease the dose.  Cut the total daily intake of opioids by one tablet each day Next start to increase the time between doses. The last dose that should be eliminated is the evening dose.  TED hose   Complete by: As directed    Use stockings (TED hose) for three weeks on both leg(s).  You may remove them at night for sleeping.   Weight bearing as tolerated   Complete by: As directed       Allergies as of 10/30/2020   No Known Allergies      Medication List     STOP taking these medications    aspirin 81 MG EC tablet   baclofen 10 MG tablet Commonly known as: LIORESAL   naproxen 500 MG tablet Commonly known as: NAPROSYN       TAKE these medications    amLODipine 10 MG tablet Commonly known as: NORVASC TAKE 1 TABLET BY MOUTH EVERY DAY   clonazePAM 0.5 MG tablet Commonly known as: KLONOPIN TAKE 1 TABLET BY MOUTH TWICE A DAY AS NEEDED FOR ANXIETY   fexofenadine 180 MG tablet Commonly known as: ALLEGRA TAKE 1 TABLET BY MOUTH EVERY DAY   gabapentin 100 MG capsule Commonly known as: NEURONTIN TAKE 1 CAPSULE BY MOUTH EVERYDAY AT BEDTIME   metFORMIN 500 MG tablet Commonly known as: GLUCOPHAGE Take 1 tablet (500 mg total) by mouth 2 (two) times daily with a meal.    methocarbamol 500 MG tablet Commonly known as: ROBAXIN Take 1 tablet (500 mg total) by mouth every 6 (six) hours as needed for muscle spasms.   metoprolol succinate 100 MG 24 hr tablet Commonly known as: TOPROL-XL TAKE 1 TABLET BY MOUTH EVERY EVENING WITH OR IMMEDIATELY FOLLOWING A MEAL   montelukast 10 MG tablet Commonly known as: SINGULAIR TAKE 1 TABLET BY MOUTH AT BEDTIME   oxyCODONE 5 MG immediate release tablet Commonly known as: Oxy IR/ROXICODONE Take 1-2 tablets (5-10 mg total) by mouth every 6 (six) hours as needed for moderate pain or severe pain.   Ozempic (1 MG/DOSE) 4 MG/3ML Sopn Generic drug: Semaglutide (1 MG/DOSE) Inject 1 mg into the skin once a week.   pantoprazole 40 MG tablet Commonly known as: PROTONIX TAKE 1 TABLET BY MOUTH TWICE A DAY   ProAir HFA 108 (90 Base) MCG/ACT inhaler Generic drug: albuterol INHALE 2 PUFFS INTO THE LUNGS EVERY 6 (SIX) HOURS AS NEEDED. FOR SHORTNESS OF BREATH.   rivaroxaban 10 MG Tabs tablet Commonly known as: XARELTO Take 1 tablet (10 mg total) by mouth daily with breakfast. Then resume one 81 mg aspirin once a day.   rosuvastatin 10 MG tablet Commonly known as: CRESTOR TAKE 1 TABLET BY MOUTH EVERY DAY   valACYclovir 1000 MG tablet Commonly known as: VALTREX Take 1,000 mg by mouth 2 (two) times daily.   valsartan 320 MG tablet Commonly known as: DIOVAN TAKE 1 TABLET BY MOUTH EVERY DAY   venlafaxine XR 150 MG 24 hr capsule Commonly known as: EFFEXOR-XR TAKE 1 CAPSULE BY MOUTH EVERY MORNING               Discharge Care Instructions  (From admission, onward)           Start     Ordered   10/30/20 0000  Weight bearing as tolerated        10/30/20 0720   10/30/20 0000  Change dressing       Comments: You may remove the bulky bandage (ACE wrap and gauze) two days after surgery. You will have an adhesive waterproof bandage underneath. Leave this in place until your first follow-up appointment.   10/30/20  0720  Follow-up Information     Gaynelle Arabian, MD. Schedule an appointment as soon as possible for a visit on 11/13/2020.   Specialty: Orthopedic Surgery Contact information: [address] [place] [address] [phone]                 Signed: Theresa Duty, PA-C Orthopedic Surgery 11/05/2020, 1:48 PM

## 2020-11-10 ENCOUNTER — Other Ambulatory Visit: Payer: Self-pay | Admitting: Physician Assistant

## 2020-11-15 ENCOUNTER — Other Ambulatory Visit: Payer: Self-pay

## 2020-11-15 ENCOUNTER — Encounter: Payer: Self-pay | Admitting: Hematology & Oncology

## 2020-11-15 ENCOUNTER — Inpatient Hospital Stay: Payer: 59

## 2020-11-15 ENCOUNTER — Inpatient Hospital Stay: Payer: 59 | Attending: Hematology & Oncology

## 2020-11-15 ENCOUNTER — Inpatient Hospital Stay (HOSPITAL_BASED_OUTPATIENT_CLINIC_OR_DEPARTMENT_OTHER): Payer: 59 | Admitting: Hematology & Oncology

## 2020-11-15 VITALS — BP 133/81 | HR 98 | Temp 98.3°F | Resp 19 | Ht 59.0 in | Wt 197.0 lb

## 2020-11-15 DIAGNOSIS — C7B02 Secondary carcinoid tumors of liver: Secondary | ICD-10-CM | POA: Diagnosis not present

## 2020-11-15 DIAGNOSIS — C7B8 Other secondary neuroendocrine tumors: Secondary | ICD-10-CM | POA: Diagnosis not present

## 2020-11-15 DIAGNOSIS — C7A00 Malignant carcinoid tumor of unspecified site: Secondary | ICD-10-CM | POA: Diagnosis present

## 2020-11-15 LAB — CBC WITH DIFFERENTIAL (CANCER CENTER ONLY)
Abs Immature Granulocytes: 0.06 10*3/uL (ref 0.00–0.07)
Basophils Absolute: 0.1 10*3/uL (ref 0.0–0.1)
Basophils Relative: 1 %
Eosinophils Absolute: 0.2 10*3/uL (ref 0.0–0.5)
Eosinophils Relative: 2 %
HCT: 35.5 % — ABNORMAL LOW (ref 36.0–46.0)
Hemoglobin: 11.4 g/dL — ABNORMAL LOW (ref 12.0–15.0)
Immature Granulocytes: 1 %
Lymphocytes Relative: 26 %
Lymphs Abs: 2.9 10*3/uL (ref 0.7–4.0)
MCH: 30 pg (ref 26.0–34.0)
MCHC: 32.1 g/dL (ref 30.0–36.0)
MCV: 93.4 fL (ref 80.0–100.0)
Monocytes Absolute: 0.7 10*3/uL (ref 0.1–1.0)
Monocytes Relative: 6 %
Neutro Abs: 7.1 10*3/uL (ref 1.7–7.7)
Neutrophils Relative %: 64 %
Platelet Count: 351 10*3/uL (ref 150–400)
RBC: 3.8 MIL/uL — ABNORMAL LOW (ref 3.87–5.11)
RDW: 16.1 % — ABNORMAL HIGH (ref 11.5–15.5)
WBC Count: 11 10*3/uL — ABNORMAL HIGH (ref 4.0–10.5)
nRBC: 0 % (ref 0.0–0.2)

## 2020-11-15 LAB — CMP (CANCER CENTER ONLY)
ALT: 14 U/L (ref 0–44)
AST: 15 U/L (ref 15–41)
Albumin: 3.9 g/dL (ref 3.5–5.0)
Alkaline Phosphatase: 119 U/L (ref 38–126)
Anion gap: 10 (ref 5–15)
BUN: 21 mg/dL (ref 8–23)
CO2: 25 mmol/L (ref 22–32)
Calcium: 9.4 mg/dL (ref 8.9–10.3)
Chloride: 103 mmol/L (ref 98–111)
Creatinine: 0.78 mg/dL (ref 0.44–1.00)
GFR, Estimated: >60 mL/min (ref >60)
Glucose, Bld: 175 mg/dL — ABNORMAL HIGH (ref 70–99)
Potassium: 4.1 mmol/L (ref 3.5–5.1)
Sodium: 138 mmol/L (ref 135–145)
Total Bilirubin: 0.6 mg/dL (ref 0.3–1.2)
Total Protein: 6.9 g/dL (ref 6.5–8.1)

## 2020-11-15 MED ORDER — LANREOTIDE ACETATE 120 MG/0.5ML ~~LOC~~ SOLN
120.0000 mg | Freq: Once | SUBCUTANEOUS | Status: AC
  Administered 2020-11-15: 120 mg via SUBCUTANEOUS
  Filled 2020-11-15: qty 120

## 2020-11-15 NOTE — Patient Instructions (Signed)
Lanreotide injection What is this medication? LANREOTIDE (lan REE oh tide) is used to reduce blood levels of growth hormone in patients with a condition called acromegaly. It also works to slow or stop tumor growth in patients with neuroendocrine tumors and treat carcinoid syndrome. This medicine may be used for other purposes; ask your health care provider or pharmacist if you have questions. COMMON BRAND NAME(S): Somatuline [place] What should I tell my care team before I take this medication? They need to know if you have any of these conditions: diabetes gallbladder disease heart disease kidney disease liver disease thyroid disease an unusual or allergic reaction to lanreotide, other medicines, foods, dyes, or preservatives pregnant or trying to get pregnant breast-feeding How should I use this medication? This medicine is for injection under the skin. It is given by a health care professional in a hospital or clinic setting. Contact your pediatrician or health care professional regarding the use of this medicine in children. Special care may be needed. Overdosage: If you think you have taken too much of this medicine contact a poison control center or emergency room at once. NOTE: This medicine is only for you. Do not share this medicine with others. What if I miss a dose? It is important not to miss your dose. Call your doctor or health care professional if you are unable to keep an appointment. What may interact with this medication? This medicine may interact with the following medications: bromocriptine cyclosporine certain medicines for blood pressure, heart disease, irregular heart beat certain medicines for diabetes quinidine terfenadine This list may not describe all possible interactions. Give your health care provider a list of all the medicines, herbs, non-prescription drugs, or dietary supplements you use. Also tell them if you smoke, drink alcohol, or use illegal drugs.  Some items may interact with your medicine. What should I watch for while using this medication? Tell your doctor or healthcare professional if your symptoms do not start to get better or if they get worse. Visit your doctor or health care professional for regular checks on your progress. Your condition will be monitored carefully while you are receiving this medicine. This medicine may increase blood sugar. Ask your healthcare provider if changes in diet or medicines are needed if you have diabetes. You may need blood work done while you are taking this medicine. Women should inform their doctor if they wish to become pregnant or think they might be pregnant. There is a potential for serious side effects to an unborn child. Talk to your health care professional or pharmacist for more information. Do not breast-feed an infant while taking this medicine or for 6 months after stopping it. This medicine has caused ovarian failure in some women. This medicine may interfere with the ability to have a child. Talk with your doctor or health care professional if you are concerned about your fertility. What side effects may I notice from receiving this medication? Side effects that you should report to your doctor or health care professional as soon as possible: allergic reactions like skin rash, itching or hives, swelling of the face, lips, or tongue increased blood pressure severe stomach pain signs and symptoms of hgh blood sugar such as being more thirsty or hungry or having to urinate more than normal. You may also feel very tired or have blurry vision. signs and symptoms of low blood sugar such as feeling anxious; confusion; dizziness; increased hunger; unusually weak or tired; sweating; shakiness; cold; irritable; headache; blurred vision; fast   heartbeat; loss of consciousness unusually slow heartbeat Side effects that usually do not require medical attention (report to your doctor or health care  professional if they continue or are bothersome): constipation diarrhea dizziness headache muscle pain muscle spasms nausea pain, redness, or irritation at site where injected This list may not describe all possible side effects. Call your doctor for medical advice about side effects. You may report side effects to FDA at 1-800-FDA-1088. Where should I keep my medication? This drug is given in a hospital or clinic and will not be stored at home. NOTE: This sheet is a summary. It may not cover all possible information. If you have questions about this medicine, talk to your doctor, pharmacist, or health care provider.  2022 Elsevier/Gold Standard (2017-11-05 09:13:08)  

## 2020-11-15 NOTE — Progress Notes (Signed)
Hematology and Oncology Follow Up Visit  Leah [name] [id number] 1960/02/16 60 y.o. 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  Surprise enough, she had knee surgery on the left knee 2 weeks ago.  She really looks fantastic.  I am incredibly impressed with her toughness.  She has a lot more range of motion than I would have thought after 2 weeks of surgery.  She feels well.  She has had no problems with diarrhea.  She has had no problems with cough or shortness of breath.  She has had no bleeding or bruising.  Her last Chromogranin A level back in July was 114.  I think she is go back to see Leah [name] in a few weeks.  I think he does the scans on her.  She has had no abdominal pain.  There is been no nausea or vomiting.  She has had no issues with headache.  Currently, I would say her performance status is ECOG 1, despite the knee surgery.   Medications:  Allergies as of 11/15/2020   No Known Allergies      Medication List        Accurate as of November 15, 2020  1:05 PM. If you have any questions, ask your nurse or doctor.          amLODipine 10 MG tablet Commonly known as: NORVASC TAKE 1 TABLET BY MOUTH EVERY DAY   clonazePAM 0.5 MG tablet Commonly known as: KLONOPIN TAKE 1 TABLET BY MOUTH TWICE A DAY AS NEEDED FOR ANXIETY   fexofenadine 180 MG tablet Commonly known as: ALLEGRA TAKE 1 TABLET BY MOUTH EVERY DAY   gabapentin 100 MG capsule Commonly known as: NEURONTIN TAKE 1 CAPSULE BY MOUTH EVERYDAY AT BEDTIME   metFORMIN 500 MG tablet Commonly known as: GLUCOPHAGE Take 1 tablet (500 mg total) by mouth 2 (two) times daily with a meal.   methocarbamol 500 MG tablet Commonly known as: ROBAXIN Take 1 tablet (500 mg total)  by mouth every 6 (six) hours as needed for muscle spasms.   metoprolol succinate 100 MG 24 hr tablet Commonly known as: TOPROL-XL TAKE 1 TABLET BY MOUTH EVERY EVENING WITH OR IMMEDIATELY FOLLOWING A MEAL   montelukast 10 MG tablet Commonly known as: SINGULAIR TAKE 1 TABLET BY MOUTH AT BEDTIME   oxyCODONE 5 MG immediate release tablet Commonly known as: Oxy IR/ROXICODONE Take 1-2 tablets (5-10 mg total) by mouth every 6 (six) hours as needed for moderate pain or severe pain.   Ozempic (1 MG/DOSE) 4 MG/3ML Sopn Generic drug: Semaglutide (1 MG/DOSE) Inject 1 mg into the skin once a week.   pantoprazole 40 MG tablet Commonly known as: PROTONIX TAKE 1 TABLET BY MOUTH TWICE A DAY   ProAir HFA 108 (90 Base) MCG/ACT inhaler Generic drug: albuterol INHALE 2 PUFFS INTO THE LUNGS EVERY 6 (SIX) HOURS AS NEEDED. FOR SHORTNESS OF BREATH.   rivaroxaban 10 MG Tabs tablet Commonly known as: XARELTO Take 1 tablet (10 mg total) by mouth daily with breakfast. Then resume one 81 mg aspirin once a day.   rosuvastatin 10 MG tablet Commonly known as: CRESTOR TAKE 1 TABLET BY MOUTH EVERY DAY   valACYclovir 1000 MG tablet Commonly known as: VALTREX Take 1,000 mg by mouth 2 (two) times daily.  valsartan 320 MG tablet Commonly known as: DIOVAN TAKE 1 TABLET BY MOUTH EVERY DAY   venlafaxine XR 150 MG 24 hr capsule Commonly known as: EFFEXOR-XR TAKE 1 CAPSULE BY MOUTH EVERY MORNING        Allergies: No Known Allergies  Past Medical History, Surgical history, Social history, and Family History were reviewed and updated.  Review of Systems: Review of Systems  Constitutional: Negative.   HENT: Negative.    Eyes: Negative.   Respiratory: Negative.    Cardiovascular: Negative.   Gastrointestinal: Negative.   Genitourinary: Negative.   Musculoskeletal: Negative.   Skin: Negative.   Neurological: Negative.   Endo/Heme/Allergies: Negative.   Psychiatric/Behavioral: Negative.       Physical Exam:  height is 4\' 11"  (1.499 m) and weight is 197 lb (89.4 kg). Her oral temperature is 98.3 F (36.8 C). Her blood pressure is 133/81 and her pulse is 98. Her respiration is 19 and oxygen saturation is 98%.   Wt Readings from Last 3 Encounters:  11/15/20 197 lb (89.4 kg)  10/29/20 198 lb 6.6 oz (90 kg)  10/17/20 198 lb (89.8 kg)    Physical Exam Vitals reviewed.  HENT:     Head: Normocephalic and atraumatic.  Eyes:     Pupils: Pupils are equal, round, and reactive to light.  Cardiovascular:     Rate and Rhythm: Normal rate and regular rhythm.     Heart sounds: Normal heart sounds.  Pulmonary:     Effort: Pulmonary effort is normal.     Breath sounds: Normal breath sounds.  Abdominal:     General: Bowel sounds are normal.     Palpations: Abdomen is soft.     Comments: Abdominal exam shows an obese abdomen that is soft.  She has good bowel sounds.  There is no fluid wave.  She has laparotomy scars that are well-healed.  There is no tenderness.  There is no guarding or rebound.  There is no palpable liver or spleen tip.  Musculoskeletal:        General: No tenderness or deformity.     Cervical back: Normal range of motion.     Comments: She has a knee brace on the left knee.  There may be a little bit of swelling in the left leg.  She does have limited range of motion of the left knee but again, much better than I would have thought.  Lymphadenopathy:     Cervical: No cervical adenopathy.  Skin:    General: Skin is warm and dry.     Findings: No erythema or rash.  Neurological:     Mental Status: She is alert and oriented to person, place, and time.  Psychiatric:        Behavior: Behavior normal.        Thought Content: Thought content normal.        Judgment: Judgment normal.     Lab Results  Component Value Date   WBC 11.0 (H) 11/15/2020   HGB 11.4 (L) 11/15/2020   HCT 35.5 (L) 11/15/2020   MCV 93.4 11/15/2020   PLT 351 11/15/2020   Lab Results   Component Value Date   FERRITIN 382 (H) 10/18/2008   IRON <10 (L) 10/18/2008   TIBC NOT CALC Not calculated due to Iron <10. 10/18/2008   UIBC 156 10/18/2008   IRONPCTSAT NOT CALC Not calculated due to Iron <10. 10/18/2008   Lab Results  Component Value Date   RETICCTPCT 0.9 10/18/2008  RBC 3.80 (L) 11/15/2020   No results found for: KPAFRELGTCHN, LAMBDASER, KAPLAMBRATIO No results found for: IGGSERUM, IGA, IGMSERUM No results found for: Kathrynn Ducking, MSPIKE, SPEI   Chemistry      Component Value Date/Time   NA 138 11/15/2020 1207   NA 140 01/20/2017 1416   NA 141 01/17/2016 1047   K 4.1 11/15/2020 1207   K 3.9 01/20/2017 1416   K 3.2 (L) 01/17/2016 1047   CL 103 11/15/2020 1207   CL 102 01/20/2017 1416   CO2 25 11/15/2020 1207   CO2 26 01/20/2017 1416   CO2 27 01/17/2016 1047   BUN 21 11/15/2020 1207   BUN 15 01/20/2017 1416   BUN 6.6 (L) 01/17/2016 1047   CREATININE 0.78 11/15/2020 1207   CREATININE 1.1 01/20/2017 1416   CREATININE 0.7 01/17/2016 1047      Component Value Date/Time   CALCIUM 9.4 11/15/2020 1207   CALCIUM 9.1 01/20/2017 1416   CALCIUM 8.7 01/17/2016 1047   ALKPHOS 119 11/15/2020 1207   ALKPHOS 84 01/20/2017 1416   ALKPHOS 90 01/17/2016 1047   AST 15 11/15/2020 1207   AST 34 01/17/2016 1047   ALT 14 11/15/2020 1207   ALT 35 01/20/2017 1416   ALT 38 01/17/2016 1047   BILITOT 0.6 11/15/2020 1207   BILITOT 0.42 01/17/2016 1047       Impression and Plan: Ms. Stifter is a very pleasant 60 yo caucasian female with long standing history of metastatic carcinoid with recurrent liver metastasis.   Again, she looks quite good despite having the knee surgery.  She really is very tough.  I am sure that she will do physical therapy and have a very good result.  She gets her [facility] at [facility].  We will have to see what her Chromogranin A level is.  She comes in monthly for her injections.  We will plan  to see her back in 3 months.  We will try to get her through the holidays before she has to come back to see [place].  We will have her come back to see [place] in about 3 months.   [name], MD 10/6/20221:05 PM

## 2020-11-16 ENCOUNTER — Telehealth: Payer: Self-pay | Admitting: *Deleted

## 2020-11-16 NOTE — Telephone Encounter (Signed)
Per 11/16/20 los - gave upcoming appointments - confirmed 

## 2020-11-19 LAB — CHROMOGRANIN A: Chromogranin A (ng/mL): 95 ng/mL (ref 0.0–101.8)

## 2020-11-21 ENCOUNTER — Other Ambulatory Visit: Payer: Self-pay

## 2020-11-21 ENCOUNTER — Ambulatory Visit (INDEPENDENT_AMBULATORY_CARE_PROVIDER_SITE_OTHER): Payer: 59 | Admitting: Family Medicine

## 2020-11-21 ENCOUNTER — Encounter (INDEPENDENT_AMBULATORY_CARE_PROVIDER_SITE_OTHER): Payer: Self-pay | Admitting: Family Medicine

## 2020-11-21 VITALS — BP 186/73 | HR 79 | Temp 98.0°F | Ht 59.0 in | Wt 197.0 lb

## 2020-11-21 DIAGNOSIS — I10 Essential (primary) hypertension: Secondary | ICD-10-CM

## 2020-11-21 DIAGNOSIS — M25562 Pain in left knee: Secondary | ICD-10-CM

## 2020-11-21 DIAGNOSIS — M25561 Pain in right knee: Secondary | ICD-10-CM | POA: Diagnosis not present

## 2020-11-21 DIAGNOSIS — E1169 Type 2 diabetes mellitus with other specified complication: Secondary | ICD-10-CM

## 2020-11-21 DIAGNOSIS — Z6841 Body Mass Index (BMI) 40.0 and over, adult: Secondary | ICD-10-CM

## 2020-11-21 DIAGNOSIS — G8929 Other chronic pain: Secondary | ICD-10-CM

## 2020-11-27 NOTE — Progress Notes (Signed)
Chief Complaint:   OBESITY Leah [name] is here to discuss her progress with her obesity treatment plan along with follow-up of her obesity related diagnoses. See Medical Weight Management Flowsheet for complete bioelectrical impedance results.  Today's visit was #: 36 Starting weight: 226 lbs Starting date: 07/07/2019 Weight change since last visit: 2 lbs Total lbs lost to date: 29 lbs Total weight loss percentage to date: -12.83%  Nutrition Plan: Category 2 Meal Plan for 70% of the time. Activity: PT for 30-40 minutes 3-4 times per week. Anti-obesity medications: Ozempic 1 mg subcutaneously weekly. Reported side effects: None.  Interim History:  Leah [name] is status post left TKA.  She is doing well.  Her blood pressure is elevated today.  She endorses increased pain.  Assessment/Plan:   1. Essential hypertension Elevated today. Medications: Norvasc 10 mg daily, metoprolol 500 mg twice daily, valsartan 320 mg daily.  Recheck.  Monitor.  Make sure to take all medications.   Plan: Avoid buying foods that are: processed, frozen, or prepackaged to avoid excess salt. We will watch for signs of hypotension as she continues lifestyle modifications.  BP Readings from Last 3 Encounters:  11/21/20 (!) 186/73  11/15/20 133/81  10/30/20 140/90   Lab Results  Component Value Date   CREATININE 0.78 11/15/2020   2. Type 2 diabetes mellitus with other specified complication, without long-term current use of insulin (HCC) Diabetes Mellitus: Not at goal. Medication: metformin 500 mg twice daily, Ozempic 1 mg subcutaneously weekly.   Plan:  Continue metformin and Ozempic at current doses. The patient will continue to focus on protein-rich, low simple carbohydrate foods. We reviewed the importance of hydration, regular exercise for stress reduction, and restorative sleep.   Lab Results  Component Value Date   HGBA1C 5.8 (H) 10/17/2020   HGBA1C 6.4 (H) 03/05/2020   HGBA1C 6.8 (H) 11/02/2019    Lab Results  Component Value Date   MICROALBUR 0.9 07/06/2019   LDLCALC 63 11/02/2019   CREATININE 0.78 11/15/2020   3. Chronic pain of both knees Status post left TKA.  Will continue to follow along as it relates to her weight loss journey.  4. Obesity, current BMI 39.8  Course: Leah [name] is currently in the action stage of change. As such, her goal is to continue with weight loss efforts.   Nutrition goals: She has agreed to the Category 2 Plan.   Exercise goals:  As is.  Behavioral modification strategies: increasing lean protein intake, decreasing simple carbohydrates, and increasing vegetables.  Leah [name] has agreed to follow-up with our clinic in 4 weeks. She was informed of the importance of frequent follow-up visits to maximize her success with intensive lifestyle modifications for her multiple health conditions.   Objective:   Blood pressure (!) 186/73, pulse 79, temperature 98 F (36.7 C), temperature source Oral, height 4\' 11"  (1.499 m), weight 197 lb (89.4 kg), SpO2 96 %. Body mass index is 39.79 kg/m.  General: Cooperative, alert, well developed, in no acute distress. HEENT: Conjunctivae and lids unremarkable. Cardiovascular: Regular rhythm.  Lungs: Normal work of breathing. Neurologic: No focal deficits.   Lab Results  Component Value Date   CREATININE 0.78 11/15/2020   BUN 21 11/15/2020   NA 138 11/15/2020   K 4.1 11/15/2020   CL 103 11/15/2020   CO2 25 11/15/2020   Lab Results  Component Value Date   ALT 14 11/15/2020   AST 15 11/15/2020   ALKPHOS 119 11/15/2020   BILITOT 0.6 11/15/2020  Lab Results  Component Value Date   HGBA1C 5.8 (H) 10/17/2020   HGBA1C 6.4 (H) 03/05/2020   HGBA1C 6.8 (H) 11/02/2019   HGBA1C 7.3 (H) 07/06/2019   HGBA1C 7.1 (H) 12/24/2018   Lab Results  Component Value Date   TSH 2.720 03/05/2020   Lab Results  Component Value Date   CHOL 140 11/02/2019   HDL 58 11/02/2019   LDLCALC 63 11/02/2019   TRIG 111  11/02/2019   CHOLHDL 2.4 11/02/2019   Lab Results  Component Value Date   VD25OH 50 07/22/2012   VD25OH 38 08/20/2010   Lab Results  Component Value Date   WBC 11.0 (H) 11/15/2020   HGB 11.4 (L) 11/15/2020   HCT 35.5 (L) 11/15/2020   MCV 93.4 11/15/2020   PLT 351 11/15/2020   Lab Results  Component Value Date   IRON <10 (L) 10/18/2008   TIBC NOT CALC Not calculated due to Iron <10. 10/18/2008   FERRITIN 382 (H) 10/18/2008   Attestation Statements:   Reviewed by clinician on day of visit: allergies, medications, problem list, medical history, surgical history, family history, social history, and previous encounter notes.  Time spent on visit including pre-visit chart review and post-visit care and charting was 36 minutes.   I, Water quality scientist, CMA, am acting as transcriptionist for Briscoe Deutscher, DO  I have reviewed the above documentation for accuracy and completeness, and I agree with the above. -  Briscoe Deutscher, DO, MS, FAAFP, DABOM - Family and Bariatric Medicine.

## 2020-11-29 ENCOUNTER — Other Ambulatory Visit: Payer: Self-pay | Admitting: Family Medicine

## 2020-11-30 ENCOUNTER — Other Ambulatory Visit: Payer: Self-pay | Admitting: Physician Assistant

## 2020-11-30 MED ORDER — CLONAZEPAM 0.5 MG PO TABS: 0.5000 mg | ORAL_TABLET | Freq: Two times a day (BID) | ORAL | 0 refills | Status: DC

## 2020-12-06 ENCOUNTER — Other Ambulatory Visit (INDEPENDENT_AMBULATORY_CARE_PROVIDER_SITE_OTHER): Payer: Self-pay | Admitting: Family Medicine

## 2020-12-06 ENCOUNTER — Other Ambulatory Visit: Payer: Self-pay | Admitting: Family Medicine

## 2020-12-06 DIAGNOSIS — M4722 Other spondylosis with radiculopathy, cervical region: Secondary | ICD-10-CM

## 2020-12-06 DIAGNOSIS — F419 Anxiety disorder, unspecified: Secondary | ICD-10-CM

## 2020-12-06 DIAGNOSIS — E1169 Type 2 diabetes mellitus with other specified complication: Secondary | ICD-10-CM

## 2020-12-06 DIAGNOSIS — M25561 Pain in right knee: Secondary | ICD-10-CM | POA: Insufficient documentation

## 2020-12-10 NOTE — Telephone Encounter (Signed)
Last seen Dr. Juleen China

## 2020-12-17 ENCOUNTER — Ambulatory Visit (INDEPENDENT_AMBULATORY_CARE_PROVIDER_SITE_OTHER): Payer: 59 | Admitting: Family Medicine

## 2020-12-17 ENCOUNTER — Encounter (INDEPENDENT_AMBULATORY_CARE_PROVIDER_SITE_OTHER): Payer: Self-pay | Admitting: Family Medicine

## 2020-12-17 ENCOUNTER — Other Ambulatory Visit: Payer: Self-pay

## 2020-12-17 VITALS — BP 117/75 | HR 85 | Temp 98.2°F | Ht 59.0 in | Wt 192.0 lb

## 2020-12-17 DIAGNOSIS — M25562 Pain in left knee: Secondary | ICD-10-CM

## 2020-12-17 DIAGNOSIS — E1169 Type 2 diabetes mellitus with other specified complication: Secondary | ICD-10-CM

## 2020-12-17 DIAGNOSIS — G8929 Other chronic pain: Secondary | ICD-10-CM

## 2020-12-17 DIAGNOSIS — Z6841 Body Mass Index (BMI) 40.0 and over, adult: Secondary | ICD-10-CM

## 2020-12-17 MED ORDER — OZEMPIC (1 MG/DOSE) 4 MG/3ML ~~LOC~~ SOPN: 1.0000 mg | PEN_INJECTOR | SUBCUTANEOUS | 0 refills | Status: DC

## 2020-12-17 NOTE — Progress Notes (Signed)
Chief Complaint:   OBESITY Leah [name] is here to discuss her progress with her obesity treatment plan along with follow-up of her obesity related diagnoses. See Medical Weight Management Flowsheet for complete bioelectrical impedance results.  Today's visit was #: 20 Starting weight: 226 lbs Starting date: 07/07/2019 Weight change since last visit: 5 lbs Total lbs lost to date: 34 lbs Total weight loss percentage to date: -15.04%  Nutrition Plan: Category 2 Meal Plan for 80% of the time. Activity: PT for 30 minutes 7 times per week. Anti-obesity medications: Ozempic 1 mg subcutaneously weekly. Reported side effects: None.  Interim History: Leah [name] says that PT is going well.  Assessment/Plan:   1. Type 2 diabetes mellitus with other specified complication, without long-term current use of insulin (HCC) Diabetes Mellitus: Improving, but not optimized. Medication: Ozempic 1 mg subcutaneously weekly.   Plan: Continue Ozempic 1 mg subcutaneously weekly.  Will refill today, as per below. The patient will continue to focus on protein-rich, low simple carbohydrate foods. We reviewed the importance of hydration, regular exercise for stress reduction, and restorative sleep.   Lab Results  Component Value Date   HGBA1C 5.8 (H) 10/17/2020   HGBA1C 6.4 (H) 03/05/2020   HGBA1C 6.8 (H) 11/02/2019   Lab Results  Component Value Date   MICROALBUR 0.9 07/06/2019   LDLCALC 63 11/02/2019   CREATININE 0.78 11/15/2020   - Refill Semaglutide, 1 MG/DOSE, (OZEMPIC, 1 MG/DOSE,) 4 MG/3ML SOPN; Inject 1 mg into the skin once a week.  Dispense: 9 mL; Refill: 0  2. Chronic pain of left knee Status post left TKA.  She is currently in PT.  We will continue to follow along as it relates to her weight loss journey.  3. Obesity BMI today is 38  Course: Leah [name] is currently in the action stage of change. As such, her goal is to continue with weight loss efforts.   Nutrition goals: She has agreed to the  Category 2 Plan.   Exercise goals:  As is.  Behavioral modification strategies: increasing lean protein intake, decreasing simple carbohydrates, and increasing vegetables.  Leah [name] has agreed to follow-up with our clinic in 4 weeks. She was informed of the importance of frequent follow-up visits to maximize her success with intensive lifestyle modifications for her multiple health conditions.   Objective:   Blood pressure 117/75, pulse 85, temperature 98.2 F (36.8 C), temperature source Oral, height 4\' 11"  (1.499 m), weight 192 lb (87.1 kg), SpO2 99 %. Body mass index is 38.78 kg/m.  General: Cooperative, alert, well developed, in no acute distress. HEENT: Conjunctivae and lids unremarkable. Cardiovascular: Regular rhythm.  Lungs: Normal work of breathing. Neurologic: No focal deficits.   Lab Results  Component Value Date   CREATININE 0.78 11/15/2020   BUN 21 11/15/2020   NA 138 11/15/2020   K 4.1 11/15/2020   CL 103 11/15/2020   CO2 25 11/15/2020   Lab Results  Component Value Date   ALT 14 11/15/2020   AST 15 11/15/2020   ALKPHOS 119 11/15/2020   BILITOT 0.6 11/15/2020   Lab Results  Component Value Date   HGBA1C 5.8 (H) 10/17/2020   HGBA1C 6.4 (H) 03/05/2020   HGBA1C 6.8 (H) 11/02/2019   HGBA1C 7.3 (H) 07/06/2019   HGBA1C 7.1 (H) 12/24/2018   Lab Results  Component Value Date   TSH 2.720 03/05/2020   Lab Results  Component Value Date   CHOL 140 11/02/2019   HDL 58 11/02/2019   LDLCALC 63 11/02/2019  TRIG 111 11/02/2019   CHOLHDL 2.4 11/02/2019   Lab Results  Component Value Date   VD25OH 50 07/22/2012   VD25OH 38 08/20/2010   Lab Results  Component Value Date   WBC 11.0 (H) 11/15/2020   HGB 11.4 (L) 11/15/2020   HCT 35.5 (L) 11/15/2020   MCV 93.4 11/15/2020   PLT 351 11/15/2020   Lab Results  Component Value Date   IRON <10 (L) 10/18/2008   TIBC NOT CALC Not calculated due to Iron <10. 10/18/2008   FERRITIN 382 (H) 10/18/2008    Attestation Statements:   Reviewed by clinician on day of visit: allergies, medications, problem list, medical history, surgical history, family history, social history, and previous encounter notes.  I, Water quality scientist, CMA, am acting as transcriptionist for Briscoe Deutscher, DO  I have reviewed the above documentation for accuracy and completeness, and I agree with the above. -  Briscoe Deutscher, DO, MS, FAAFP, DABOM - Family and Bariatric Medicine.

## 2020-12-18 ENCOUNTER — Ambulatory Visit: Payer: 59

## 2020-12-19 ENCOUNTER — Other Ambulatory Visit: Payer: Self-pay

## 2020-12-19 ENCOUNTER — Inpatient Hospital Stay: Payer: 59 | Attending: Hematology & Oncology

## 2020-12-19 VITALS — BP 141/82 | Temp 98.9°F | Resp 19

## 2020-12-19 DIAGNOSIS — C7A00 Malignant carcinoid tumor of unspecified site: Secondary | ICD-10-CM | POA: Insufficient documentation

## 2020-12-19 DIAGNOSIS — C7B02 Secondary carcinoid tumors of liver: Secondary | ICD-10-CM | POA: Insufficient documentation

## 2020-12-19 DIAGNOSIS — C7B8 Other secondary neuroendocrine tumors: Secondary | ICD-10-CM

## 2020-12-19 MED ORDER — LANREOTIDE ACETATE 120 MG/0.5ML ~~LOC~~ SOLN
120.0000 mg | Freq: Once | SUBCUTANEOUS | Status: AC
  Administered 2020-12-19: 120 mg via SUBCUTANEOUS
  Filled 2020-12-19: qty 120

## 2020-12-19 NOTE — Patient Instructions (Signed)
Lanreotide injection °What is this medication? °LANREOTIDE (lan REE oh tide) is used to reduce blood levels of growth hormone in patients with a condition called acromegaly. It also works to slow or stop tumor growth in patients with neuroendocrine tumors and treat carcinoid syndrome. °This medicine may be used for other purposes; ask your health care provider or pharmacist if you have questions. °COMMON BRAND NAME(S): Somatuline [place] °What should I tell my care team before I take this medication? °They need to know if you have any of these conditions: °diabetes °gallbladder disease °heart disease °kidney disease °liver disease °thyroid disease °an unusual or allergic reaction to lanreotide, other medicines, foods, dyes, or preservatives °pregnant or trying to get pregnant °breast-feeding °How should I use this medication? °This medicine is for injection under the skin. It is given by a health care professional in a hospital or clinic setting. °Contact your pediatrician or health care professional regarding the use of this medicine in children. Special care may be needed. °Overdosage: If you think you have taken too much of this medicine contact a poison control center or emergency room at once. °NOTE: This medicine is only for you. Do not share this medicine with others. °What if I miss a dose? °It is important not to miss your dose. Call your doctor or health care professional if you are unable to keep an appointment. °What may interact with this medication? °This medicine may interact with the following medications: °bromocriptine °cyclosporine °certain medicines for blood pressure, heart disease, irregular heart beat °certain medicines for diabetes °quinidine °terfenadine °This list may not describe all possible interactions. Give your health care provider a list of all the medicines, herbs, non-prescription drugs, or dietary supplements you use. Also tell them if you smoke, drink alcohol, or use illegal drugs.  Some items may interact with your medicine. °What should I watch for while using this medication? °Tell your doctor or healthcare professional if your symptoms do not start to get better or if they get worse. °Visit your doctor or health care professional for regular checks on your progress. Your condition will be monitored carefully while you are receiving this medicine. °This medicine may increase blood sugar. Ask your healthcare provider if changes in diet or medicines are needed if you have diabetes. °You may need blood work done while you are taking this medicine. °Women should inform their doctor if they wish to become pregnant or think they might be pregnant. There is a potential for serious side effects to an unborn child. Talk to your health care professional or pharmacist for more information. Do not breast-feed an infant while taking this medicine or for 6 months after stopping it. °This medicine has caused ovarian failure in some women. This medicine may interfere with the ability to have a child. Talk with your doctor or health care professional if you are concerned about your fertility. °What side effects may I notice from receiving this medication? °Side effects that you should report to your doctor or health care professional as soon as possible: °allergic reactions like skin rash, itching or hives, swelling of the face, lips, or tongue °increased blood pressure °severe stomach pain °signs and symptoms of hgh blood sugar such as being more thirsty or hungry or having to urinate more than normal. You may also feel very tired or have blurry vision. °signs and symptoms of low blood sugar such as feeling anxious; confusion; dizziness; increased hunger; unusually weak or tired; sweating; shakiness; cold; irritable; headache; blurred vision; fast   heartbeat; loss of consciousness °unusually slow heartbeat °Side effects that usually do not require medical attention (report to your doctor or health care  professional if they continue or are bothersome): °constipation °diarrhea °dizziness °headache °muscle pain °muscle spasms °nausea °pain, redness, or irritation at site where injected °This list may not describe all possible side effects. Call your doctor for medical advice about side effects. You may report side effects to FDA at 1-800-FDA-1088. °Where should I keep my medication? °This drug is given in a hospital or clinic and will not be stored at home. °NOTE: This sheet is a summary. It may not cover all possible information. If you have questions about this medicine, talk to your doctor, pharmacist, or health care provider. °© 2022 [employer] (2017-12-16 00:00:00) ° °

## 2020-12-23 ENCOUNTER — Encounter (INDEPENDENT_AMBULATORY_CARE_PROVIDER_SITE_OTHER): Payer: Self-pay | Admitting: Family Medicine

## 2021-01-12 ENCOUNTER — Other Ambulatory Visit: Payer: Self-pay | Admitting: Physician Assistant

## 2021-01-12 DIAGNOSIS — M4722 Other spondylosis with radiculopathy, cervical region: Secondary | ICD-10-CM

## 2021-01-15 ENCOUNTER — Inpatient Hospital Stay: Payer: 59 | Attending: Hematology & Oncology

## 2021-01-15 ENCOUNTER — Other Ambulatory Visit: Payer: Self-pay | Admitting: Physician Assistant

## 2021-01-15 ENCOUNTER — Other Ambulatory Visit: Payer: Self-pay

## 2021-01-15 VITALS — BP 144/81 | HR 75 | Temp 98.3°F | Resp 18

## 2021-01-15 DIAGNOSIS — C7B8 Other secondary neuroendocrine tumors: Secondary | ICD-10-CM

## 2021-01-15 DIAGNOSIS — C7B02 Secondary carcinoid tumors of liver: Secondary | ICD-10-CM | POA: Insufficient documentation

## 2021-01-15 DIAGNOSIS — C7A00 Malignant carcinoid tumor of unspecified site: Secondary | ICD-10-CM | POA: Insufficient documentation

## 2021-01-15 MED ORDER — LANREOTIDE ACETATE 120 MG/0.5ML ~~LOC~~ SOLN
120.0000 mg | Freq: Once | SUBCUTANEOUS | Status: AC
  Administered 2021-01-15: 120 mg via SUBCUTANEOUS
  Filled 2021-01-15: qty 120

## 2021-01-15 NOTE — Patient Instructions (Signed)
Lanreotide injection °What is this medication? °LANREOTIDE (lan REE oh tide) is used to reduce blood levels of growth hormone in patients with a condition called acromegaly. It also works to slow or stop tumor growth in patients with neuroendocrine tumors and treat carcinoid syndrome. °This medicine may be used for other purposes; ask your health care provider or pharmacist if you have questions. °COMMON BRAND NAME(S): Somatuline [place] °What should I tell my care team before I take this medication? °They need to know if you have any of these conditions: °diabetes °gallbladder disease °heart disease °kidney disease °liver disease °thyroid disease °an unusual or allergic reaction to lanreotide, other medicines, foods, dyes, or preservatives °pregnant or trying to get pregnant °breast-feeding °How should I use this medication? °This medicine is for injection under the skin. It is given by a health care professional in a hospital or clinic setting. °Contact your pediatrician or health care professional regarding the use of this medicine in children. Special care may be needed. °Overdosage: If you think you have taken too much of this medicine contact a poison control center or emergency room at once. °NOTE: This medicine is only for you. Do not share this medicine with others. °What if I miss a dose? °It is important not to miss your dose. Call your doctor or health care professional if you are unable to keep an appointment. °What may interact with this medication? °This medicine may interact with the following medications: °bromocriptine °cyclosporine °certain medicines for blood pressure, heart disease, irregular heart beat °certain medicines for diabetes °quinidine °terfenadine °This list may not describe all possible interactions. Give your health care provider a list of all the medicines, herbs, non-prescription drugs, or dietary supplements you use. Also tell them if you smoke, drink alcohol, or use illegal drugs.  Some items may interact with your medicine. °What should I watch for while using this medication? °Tell your doctor or healthcare professional if your symptoms do not start to get better or if they get worse. °Visit your doctor or health care professional for regular checks on your progress. Your condition will be monitored carefully while you are receiving this medicine. °This medicine may increase blood sugar. Ask your healthcare provider if changes in diet or medicines are needed if you have diabetes. °You may need blood work done while you are taking this medicine. °Women should inform their doctor if they wish to become pregnant or think they might be pregnant. There is a potential for serious side effects to an unborn child. Talk to your health care professional or pharmacist for more information. Do not breast-feed an infant while taking this medicine or for 6 months after stopping it. °This medicine has caused ovarian failure in some women. This medicine may interfere with the ability to have a child. Talk with your doctor or health care professional if you are concerned about your fertility. °What side effects may I notice from receiving this medication? °Side effects that you should report to your doctor or health care professional as soon as possible: °allergic reactions like skin rash, itching or hives, swelling of the face, lips, or tongue °increased blood pressure °severe stomach pain °signs and symptoms of hgh blood sugar such as being more thirsty or hungry or having to urinate more than normal. You may also feel very tired or have blurry vision. °signs and symptoms of low blood sugar such as feeling anxious; confusion; dizziness; increased hunger; unusually weak or tired; sweating; shakiness; cold; irritable; headache; blurred vision; fast   heartbeat; loss of consciousness °unusually slow heartbeat °Side effects that usually do not require medical attention (report to your doctor or health care  professional if they continue or are bothersome): °constipation °diarrhea °dizziness °headache °muscle pain °muscle spasms °nausea °pain, redness, or irritation at site where injected °This list may not describe all possible side effects. Call your doctor for medical advice about side effects. You may report side effects to FDA at 1-800-FDA-1088. °Where should I keep my medication? °This drug is given in a hospital or clinic and will not be stored at home. °NOTE: This sheet is a summary. It may not cover all possible information. If you have questions about this medicine, talk to your doctor, pharmacist, or health care provider. °© 2022 [employer] (2017-12-16 00:00:00) ° °

## 2021-01-16 ENCOUNTER — Other Ambulatory Visit: Payer: Self-pay | Admitting: Physician Assistant

## 2021-01-16 MED ORDER — CLONAZEPAM 0.5 MG PO TABS: 0.5000 mg | ORAL_TABLET | Freq: Two times a day (BID) | ORAL | 2 refills | Status: DC

## 2021-01-21 ENCOUNTER — Other Ambulatory Visit: Payer: Self-pay

## 2021-01-21 ENCOUNTER — Encounter (INDEPENDENT_AMBULATORY_CARE_PROVIDER_SITE_OTHER): Payer: Self-pay | Admitting: Family Medicine

## 2021-01-21 ENCOUNTER — Ambulatory Visit (INDEPENDENT_AMBULATORY_CARE_PROVIDER_SITE_OTHER): Payer: 59 | Admitting: Family Medicine

## 2021-01-21 VITALS — BP 143/80 | HR 100 | Temp 98.1°F | Ht 59.0 in | Wt 195.0 lb

## 2021-01-21 DIAGNOSIS — E1169 Type 2 diabetes mellitus with other specified complication: Secondary | ICD-10-CM | POA: Diagnosis not present

## 2021-01-21 DIAGNOSIS — I152 Hypertension secondary to endocrine disorders: Secondary | ICD-10-CM

## 2021-01-21 DIAGNOSIS — E1159 Type 2 diabetes mellitus with other circulatory complications: Secondary | ICD-10-CM | POA: Diagnosis not present

## 2021-01-21 DIAGNOSIS — F3289 Other specified depressive episodes: Secondary | ICD-10-CM | POA: Diagnosis not present

## 2021-01-21 DIAGNOSIS — Z6841 Body Mass Index (BMI) 40.0 and over, adult: Secondary | ICD-10-CM

## 2021-01-22 NOTE — Progress Notes (Signed)
Chief Complaint:   OBESITY Leah [name] is here to discuss her progress with her obesity treatment plan along with follow-up of her obesity related diagnoses. See Medical Weight Management Flowsheet for complete bioelectrical impedance results.  Today's visit was #: 21 Starting weight: 226 lbs Starting date: 07/07/2019 Weight change since last visit: +3 lbs Total lbs lost to date: 31 lbs Total weight loss percentage to date: -13.72%  Nutrition Plan: Category 2 Plan for 80% of the time.  Activity: None. Anti-obesity medications: Ozempic 1 mg subcutaneously weekly. Reported side effects: None.  Interim History: Leah [name] is taking Ozempic at the 0.25 mg dose due to intolerance.  Assessment/Plan:   1. Type 2 diabetes mellitus with other specified complication, without long-term current use of insulin (HCC) Diabetes Mellitus: Not at goal. Medication: Ozempic 0.25 mg subcutaneously weekly. Issues reviewed: blood sugar goals, complications of diabetes mellitus, hypoglycemia prevention and treatment, exercise, and nutrition.  Plan:  Continue Ozempic 1 mg subcutaneously weekly. The patient will continue to focus on protein-rich, low simple carbohydrate foods. We reviewed the importance of hydration, regular exercise for stress reduction, and restorative sleep.   Lab Results  Component Value Date   HGBA1C 5.8 (H) 10/17/2020   HGBA1C 6.4 (H) 03/05/2020   HGBA1C 6.8 (H) 11/02/2019   Lab Results  Component Value Date   MICROALBUR 0.9 07/06/2019   LDLCALC 63 11/02/2019   CREATININE 0.78 11/15/2020   2. Hypertension associated with type 2 diabetes mellitus (HCC) Elevated today. Medications: Norvasc 10 mg daily, metoprolol 100 mg daily, valsartan 320 mg daily.   Plan: Avoid buying foods that are: processed, frozen, or prepackaged to avoid excess salt. We will watch for signs of hypotension as she continues lifestyle modifications.  BP Readings from Last 3 Encounters:  01/21/21 (!) 143/80   01/15/21 (!) 144/81  12/19/20 (!) 141/82   Lab Results  Component Value Date   CREATININE 0.78 11/15/2020   3. Other depression, with emotional eating Medication: Effexor-XR 150 mg daily.   Plan:  Discussed cues and consequences, how thoughts affect eating, model of thoughts, feelings, and behaviors, and strategies for change by focusing on the cue. Discussed cognitive distortions, coping thoughts, and how to change your thoughts.  4. Obesity BMI today is 39.4  Course: Leah [name] is currently in the action stage of change. As such, her goal is to continue with weight loss efforts.   Nutrition goals: She has agreed to the Category 2 Plan.   Exercise goals: Older adults should follow the adult guidelines. When older adults cannot meet the adult guidelines, they should be as physically active as their abilities and conditions will allow.  Older adults should do exercises that maintain or improve balance if they are at risk of falling.   Behavioral modification strategies: increasing lean protein intake, decreasing simple carbohydrates, increasing vegetables, and increasing water intake.  Leah [name] has agreed to follow-up with our clinic in 4 weeks. She was informed of the importance of frequent follow-up visits to maximize her success with intensive lifestyle modifications for her multiple health conditions.   Objective:   Blood pressure (!) 143/80, pulse 100, temperature 98.1 F (36.7 C), temperature source Oral, height 4\' 11"  (1.499 m), weight 195 lb (88.5 kg), SpO2 97 %. Body mass index is 39.39 kg/m.  General: Cooperative, alert, well developed, in no acute distress. HEENT: Conjunctivae and lids unremarkable. Cardiovascular: Regular rhythm.  Lungs: Normal work of breathing. Neurologic: No focal deficits.   Lab Results  Component Value Date  CREATININE 0.78 11/15/2020   BUN 21 11/15/2020   NA 138 11/15/2020   K 4.1 11/15/2020   CL 103 11/15/2020   CO2 25 11/15/2020   Lab  Results  Component Value Date   ALT 14 11/15/2020   AST 15 11/15/2020   ALKPHOS 119 11/15/2020   BILITOT 0.6 11/15/2020   Lab Results  Component Value Date   HGBA1C 5.8 (H) 10/17/2020   HGBA1C 6.4 (H) 03/05/2020   HGBA1C 6.8 (H) 11/02/2019   HGBA1C 7.3 (H) 07/06/2019   HGBA1C 7.1 (H) 12/24/2018   Lab Results  Component Value Date   TSH 2.720 03/05/2020   Lab Results  Component Value Date   CHOL 140 11/02/2019   HDL 58 11/02/2019   LDLCALC 63 11/02/2019   TRIG 111 11/02/2019   CHOLHDL 2.4 11/02/2019   Lab Results  Component Value Date   VD25OH 50 07/22/2012   VD25OH 38 08/20/2010   Lab Results  Component Value Date   WBC 11.0 (H) 11/15/2020   HGB 11.4 (L) 11/15/2020   HCT 35.5 (L) 11/15/2020   MCV 93.4 11/15/2020   PLT 351 11/15/2020   Lab Results  Component Value Date   IRON <10 (L) 10/18/2008   TIBC NOT CALC Not calculated due to Iron <10. 10/18/2008   FERRITIN 382 (H) 10/18/2008   Attestation Statements:   Reviewed by clinician on day of visit: allergies, medications, problem list, medical history, surgical history, family history, social history, and previous encounter notes.  I, Water quality scientist, CMA, am acting as transcriptionist for Briscoe Deutscher, DO  I have reviewed the above documentation for accuracy and completeness, and I agree with the above. -  Briscoe Deutscher, DO, MS, FAAFP, DABOM - Family and Bariatric Medicine.

## 2021-02-06 ENCOUNTER — Other Ambulatory Visit: Payer: Self-pay | Admitting: Physician Assistant

## 2021-02-10 ENCOUNTER — Encounter: Payer: Self-pay | Admitting: Family

## 2021-02-12 ENCOUNTER — Other Ambulatory Visit: Payer: Self-pay | Admitting: Family Medicine

## 2021-02-12 ENCOUNTER — Other Ambulatory Visit: Payer: Self-pay | Admitting: Physician Assistant

## 2021-02-12 DIAGNOSIS — J301 Allergic rhinitis due to pollen: Secondary | ICD-10-CM

## 2021-02-12 DIAGNOSIS — M4722 Other spondylosis with radiculopathy, cervical region: Secondary | ICD-10-CM

## 2021-02-14 ENCOUNTER — Telehealth: Payer: Self-pay | Admitting: *Deleted

## 2021-02-14 ENCOUNTER — Encounter: Payer: Self-pay | Admitting: Family

## 2021-02-14 NOTE — Telephone Encounter (Signed)
Message received from [place] to cancel pt.'s appts for tomorrow, 02/15/21 d/t pt has new insurance that is in process.  Call placed to patient to reschedule appts by scheduler and patient was very upset about cancellation of appts and was transferred to this RN. Pt very angry and states that she has had "fever, chills and vomiting times four days and has been waiting for this appt to feel better."  Pt instructed that she needs to go to the closest urgent care or ER now d/t her symptoms.  Pt upset with that order and handed the phone to her son while she continued to yell at this RN.  Pt.'s son instructed to take patient to the ER or urgent care now.  He states that he will take her once he can get her a ride and hung up on this RN.  Dr. Marin Olp notified.

## 2021-02-15 ENCOUNTER — Other Ambulatory Visit: Payer: BC Managed Care – PPO

## 2021-02-15 ENCOUNTER — Ambulatory Visit: Payer: BC Managed Care – PPO

## 2021-02-15 ENCOUNTER — Ambulatory Visit: Payer: BC Managed Care – PPO | Admitting: Hematology & Oncology

## 2021-02-17 ENCOUNTER — Other Ambulatory Visit: Payer: Self-pay | Admitting: Family Medicine

## 2021-02-17 DIAGNOSIS — M4722 Other spondylosis with radiculopathy, cervical region: Secondary | ICD-10-CM

## 2021-02-19 ENCOUNTER — Inpatient Hospital Stay: Payer: BC Managed Care – PPO

## 2021-02-19 ENCOUNTER — Encounter: Payer: Self-pay | Admitting: Hematology & Oncology

## 2021-02-19 ENCOUNTER — Inpatient Hospital Stay: Payer: BC Managed Care – PPO | Attending: Hematology & Oncology

## 2021-02-19 ENCOUNTER — Other Ambulatory Visit: Payer: Self-pay

## 2021-02-19 ENCOUNTER — Inpatient Hospital Stay (HOSPITAL_BASED_OUTPATIENT_CLINIC_OR_DEPARTMENT_OTHER): Payer: BC Managed Care – PPO | Admitting: Hematology & Oncology

## 2021-02-19 VITALS — BP 105/80 | HR 110 | Temp 99.1°F | Resp 16 | Wt 198.0 lb

## 2021-02-19 DIAGNOSIS — C7A00 Malignant carcinoid tumor of unspecified site: Secondary | ICD-10-CM | POA: Diagnosis not present

## 2021-02-19 DIAGNOSIS — C7B8 Other secondary neuroendocrine tumors: Secondary | ICD-10-CM

## 2021-02-19 DIAGNOSIS — M7989 Other specified soft tissue disorders: Secondary | ICD-10-CM | POA: Diagnosis not present

## 2021-02-19 DIAGNOSIS — R000 Tachycardia, unspecified: Secondary | ICD-10-CM | POA: Insufficient documentation

## 2021-02-19 DIAGNOSIS — C7B02 Secondary carcinoid tumors of liver: Secondary | ICD-10-CM | POA: Diagnosis not present

## 2021-02-19 DIAGNOSIS — E669 Obesity, unspecified: Secondary | ICD-10-CM | POA: Insufficient documentation

## 2021-02-19 DIAGNOSIS — R112 Nausea with vomiting, unspecified: Secondary | ICD-10-CM | POA: Diagnosis not present

## 2021-02-19 LAB — CMP (CANCER CENTER ONLY)
ALT: 16 U/L (ref 0–44)
AST: 20 U/L (ref 15–41)
Albumin: 4.3 g/dL (ref 3.5–5.0)
Alkaline Phosphatase: 108 U/L (ref 38–126)
Anion gap: 12 (ref 5–15)
BUN: 11 mg/dL (ref 8–23)
CO2: 25 mmol/L (ref 22–32)
Calcium: 9.9 mg/dL (ref 8.9–10.3)
Chloride: 100 mmol/L (ref 98–111)
Creatinine: 0.74 mg/dL (ref 0.44–1.00)
GFR, Estimated: >60 mL/min (ref >60)
Glucose, Bld: 154 mg/dL — ABNORMAL HIGH (ref 70–99)
Potassium: 3.7 mmol/L (ref 3.5–5.1)
Sodium: 137 mmol/L (ref 135–145)
Total Bilirubin: 0.5 mg/dL (ref 0.3–1.2)
Total Protein: 7.1 g/dL (ref 6.5–8.1)

## 2021-02-19 LAB — CBC WITH DIFFERENTIAL (CANCER CENTER ONLY)
Abs Immature Granulocytes: 0.04 10*3/uL (ref 0.00–0.07)
Basophils Absolute: 0 10*3/uL (ref 0.0–0.1)
Basophils Relative: 0 %
Eosinophils Absolute: 0.2 10*3/uL (ref 0.0–0.5)
Eosinophils Relative: 2 %
HCT: 38.9 % (ref 36.0–46.0)
Hemoglobin: 12.3 g/dL (ref 12.0–15.0)
Immature Granulocytes: 0 %
Lymphocytes Relative: 28 %
Lymphs Abs: 2.9 10*3/uL (ref 0.7–4.0)
MCH: 27.8 pg (ref 26.0–34.0)
MCHC: 31.6 g/dL (ref 30.0–36.0)
MCV: 88 fL (ref 80.0–100.0)
Monocytes Absolute: 0.7 10*3/uL (ref 0.1–1.0)
Monocytes Relative: 6 %
Neutro Abs: 6.4 10*3/uL (ref 1.7–7.7)
Neutrophils Relative %: 64 %
Platelet Count: 276 10*3/uL (ref 150–400)
RBC: 4.42 MIL/uL (ref 3.87–5.11)
RDW: 14 % (ref 11.5–15.5)
WBC Count: 10.3 10*3/uL (ref 4.0–10.5)
nRBC: 0 % (ref 0.0–0.2)

## 2021-02-19 LAB — LACTATE DEHYDROGENASE: LDH: 170 U/L (ref 98–192)

## 2021-02-19 MED ORDER — LANREOTIDE ACETATE 120 MG/0.5ML ~~LOC~~ SOLN
120.0000 mg | Freq: Once | SUBCUTANEOUS | Status: AC
  Administered 2021-02-19: 120 mg via SUBCUTANEOUS
  Filled 2021-02-19: qty 120

## 2021-02-19 NOTE — Patient Instructions (Signed)
Lanreotide injection °What is this medication? °LANREOTIDE (lan REE oh tide) is used to reduce blood levels of growth hormone in patients with a condition called acromegaly. It also works to slow or stop tumor growth in patients with neuroendocrine tumors and treat carcinoid syndrome. °This medicine may be used for other purposes; ask your health care provider or pharmacist if you have questions. °COMMON BRAND NAME(S): Somatuline [place] °What should I tell my care team before I take this medication? °They need to know if you have any of these conditions: °diabetes °gallbladder disease °heart disease °kidney disease °liver disease °thyroid disease °an unusual or allergic reaction to lanreotide, other medicines, foods, dyes, or preservatives °pregnant or trying to get pregnant °breast-feeding °How should I use this medication? °This medicine is for injection under the skin. It is given by a health care professional in a hospital or clinic setting. °Contact your pediatrician or health care professional regarding the use of this medicine in children. Special care may be needed. °Overdosage: If you think you have taken too much of this medicine contact a poison control center or emergency room at once. °NOTE: This medicine is only for you. Do not share this medicine with others. °What if I miss a dose? °It is important not to miss your dose. Call your doctor or health care professional if you are unable to keep an appointment. °What may interact with this medication? °This medicine may interact with the following medications: °bromocriptine °cyclosporine °certain medicines for blood pressure, heart disease, irregular heart beat °certain medicines for diabetes °quinidine °terfenadine °This list may not describe all possible interactions. Give your health care provider a list of all the medicines, herbs, non-prescription drugs, or dietary supplements you use. Also tell them if you smoke, drink alcohol, or use illegal drugs.  Some items may interact with your medicine. °What should I watch for while using this medication? °Tell your doctor or healthcare professional if your symptoms do not start to get better or if they get worse. °Visit your doctor or health care professional for regular checks on your progress. Your condition will be monitored carefully while you are receiving this medicine. °This medicine may increase blood sugar. Ask your healthcare provider if changes in diet or medicines are needed if you have diabetes. °You may need blood work done while you are taking this medicine. °Women should inform their doctor if they wish to become pregnant or think they might be pregnant. There is a potential for serious side effects to an unborn child. Talk to your health care professional or pharmacist for more information. Do not breast-feed an infant while taking this medicine or for 6 months after stopping it. °This medicine has caused ovarian failure in some women. This medicine may interfere with the ability to have a child. Talk with your doctor or health care professional if you are concerned about your fertility. °What side effects may I notice from receiving this medication? °Side effects that you should report to your doctor or health care professional as soon as possible: °allergic reactions like skin rash, itching or hives, swelling of the face, lips, or tongue °increased blood pressure °severe stomach pain °signs and symptoms of hgh blood sugar such as being more thirsty or hungry or having to urinate more than normal. You may also feel very tired or have blurry vision. °signs and symptoms of low blood sugar such as feeling anxious; confusion; dizziness; increased hunger; unusually weak or tired; sweating; shakiness; cold; irritable; headache; blurred vision; fast   heartbeat; loss of consciousness °unusually slow heartbeat °Side effects that usually do not require medical attention (report to your doctor or health care  professional if they continue or are bothersome): °constipation °diarrhea °dizziness °headache °muscle pain °muscle spasms °nausea °pain, redness, or irritation at site where injected °This list may not describe all possible side effects. Call your doctor for medical advice about side effects. You may report side effects to FDA at 1-800-FDA-1088. °Where should I keep my medication? °This drug is given in a hospital or clinic and will not be stored at home. °NOTE: This sheet is a summary. It may not cover all possible information. If you have questions about this medicine, talk to your doctor, pharmacist, or health care provider. °© 2022 [employer] (2017-12-16 00:00:00) ° °

## 2021-02-19 NOTE — Progress Notes (Signed)
Hematology and Oncology Follow Up Visit  Leah [name] [id number] 06-14-1960 61 y.o. 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 Leah [name] is here today for follow-up and Somatuline injection.  She has had problems since we last gave her them Somatuline.  She has Somatuline back in early December.  She called [place] a week or so ago.  Unfortunately, her insurance company has not approved the [employer] for 2023.  She was not feeling well.  She was having problems with nausea and vomiting.  She was not having any diarrhea.  I think she may have had some temperatures.  We told her that she really needed to go to the ER if she is having these issues.  We worried that she may have had COVID.  She said that she did not have COVID.  She really had a hard time.  Again, we told her that this was an insurance issue and that we were limited as to how we could help her.  I just did not think that her symptoms were from her neuroendocrine issues.  Her last Chromogranin A was down to 95.  She had a liver MRI on 12/18/2020.  This is done at [name].  No suspicious lesions were noted in the liver that were new.  She had stable postsurgical/post ablation changes.  She had a similar 1.5 cm lesion along the resection margin in the liver.  She is feeling better.  I will know she had some kind of viral syndrome.  A problem now that she is tachycardic.  She is on Toprol.  Heart rate is 110.  I am not sure why her heart rate would be still high.  She does have a cardiologist.  We will have to let the cardiologist know what is going on.  She does feel better today.  She does feel bad about her behavior.  She was totally out of character from how we know her.  She has always been so nice and kind.  Currently, I would say performance status is  ECOG 1.    Medications:  Allergies as of 02/19/2021   No Known Allergies      Medication List        Accurate as of February 19, 2021 11:07 AM. If you have any questions, ask your nurse or doctor.          amLODipine 10 MG tablet Commonly known as: NORVASC TAKE 1 TABLET BY MOUTH EVERY DAY   ciclopirox 8 % solution Commonly known as: PENLAC Apply topically daily.   clonazePAM 0.5 MG tablet Commonly known as: KLONOPIN Take 1 tablet (0.5 mg total) by mouth 2 (two) times daily.   fexofenadine 180 MG tablet Commonly known as: ALLEGRA TAKE 1 TABLET BY MOUTH EVERY DAY   gabapentin 100 MG capsule Commonly known as: NEURONTIN TAKE 1 CAPSULE BY MOUTH EVERYDAY AT BEDTIME   ketoconazole 2 % cream Commonly known as: NIZORAL Apply topically daily.   metFORMIN 500 MG tablet Commonly known as: GLUCOPHAGE Take 1 tablet (500 mg total) by mouth 2 (two) times daily with a meal.   methocarbamol 500 MG tablet Commonly known as: ROBAXIN Take 1 tablet (500 mg total) by mouth every 6 (six) hours as needed for muscle spasms.   metoprolol succinate 100 MG 24 hr tablet Commonly known as: TOPROL-XL TAKE  1 TABLET BY MOUTH EVERY EVENING WITH OR IMMEDIATELY FOLLOWING A MEAL   montelukast 10 MG tablet Commonly known as: SINGULAIR TAKE 1 TABLET BY MOUTH AT BEDTIME   ondansetron 4 MG disintegrating tablet Commonly known as: ZOFRAN-ODT TAKE 1 TABLET BY MOUTH EVERY 8 HOURS AS NEEDED   Ozempic (1 MG/DOSE) 4 MG/3ML Sopn Generic drug: Semaglutide (1 MG/DOSE) Inject 1 mg into the skin once a week.   pantoprazole 40 MG tablet Commonly known as: PROTONIX TAKE 1 TABLET BY MOUTH TWICE A DAY   ProAir HFA 108 (90 Base) MCG/ACT inhaler Generic drug: albuterol INHALE 2 PUFFS INTO THE LUNGS EVERY 6 (SIX) HOURS AS NEEDED. FOR SHORTNESS OF BREATH.   rivaroxaban 10 MG Tabs tablet Commonly known as: XARELTO Take 1 tablet (10 mg total) by mouth daily with breakfast. Then resume one 81 mg aspirin  once a day.   rosuvastatin 10 MG tablet Commonly known as: CRESTOR TAKE 1 TABLET BY MOUTH EVERY DAY   valACYclovir 1000 MG tablet Commonly known as: VALTREX Take 1,000 mg by mouth 2 (two) times daily.   valsartan 320 MG tablet Commonly known as: DIOVAN TAKE 1 TABLET BY MOUTH EVERY DAY   venlafaxine XR 150 MG 24 hr capsule Commonly known as: EFFEXOR-XR TAKE 1 CAPSULE BY MOUTH EVERY DAY IN THE MORNING        Allergies: No Known Allergies  Past Medical History, Surgical history, Social history, and Family History were reviewed and updated.  Review of Systems: Review of Systems  Constitutional: Negative.   HENT: Negative.    Eyes: Negative.   Respiratory: Negative.    Cardiovascular: Negative.   Gastrointestinal: Negative.   Genitourinary: Negative.   Musculoskeletal: Negative.   Skin: Negative.   Neurological: Negative.   Endo/Heme/Allergies: Negative.   Psychiatric/Behavioral: Negative.      Physical Exam:  weight is 198 lb (89.8 kg). Her oral temperature is 99.1 F (37.3 C). Her blood pressure is 105/80 and her pulse is 110 (abnormal). Her respiration is 16 and oxygen saturation is 100%.   Wt Readings from Last 3 Encounters:  02/19/21 198 lb (89.8 kg)  01/21/21 195 lb (88.5 kg)  12/17/20 192 lb (87.1 kg)    Physical Exam Vitals reviewed.  HENT:     Head: Normocephalic and atraumatic.  Eyes:     Pupils: Pupils are equal, round, and reactive to light.  Cardiovascular:     Rate and Rhythm: Normal rate and regular rhythm.     Heart sounds: Normal heart sounds.  Pulmonary:     Effort: Pulmonary effort is normal.     Breath sounds: Normal breath sounds.  Abdominal:     General: Bowel sounds are normal.     Palpations: Abdomen is soft.     Comments: Abdominal exam shows an obese abdomen that is soft.  She has good bowel sounds.  There is no fluid wave.  She has laparotomy scars that are well-healed.  There is no tenderness.  There is no guarding or rebound.   There is no palpable liver or spleen tip.  Musculoskeletal:        General: No tenderness or deformity.     Cervical back: Normal range of motion.     Comments: She has a knee brace on the left knee.  There may be a little bit of swelling in the left leg.  She does have limited range of motion of the left knee but again, much better than I would have thought.  Lymphadenopathy:  Cervical: No cervical adenopathy.  Skin:    General: Skin is warm and dry.     Findings: No erythema or rash.  Neurological:     Mental Status: She is alert and oriented to person, place, and time.  Psychiatric:        Behavior: Behavior normal.        Thought Content: Thought content normal.        Judgment: Judgment normal.     Lab Results  Component Value Date   WBC 10.3 02/19/2021   HGB 12.3 02/19/2021   HCT 38.9 02/19/2021   MCV 88.0 02/19/2021   PLT 276 02/19/2021   Lab Results  Component Value Date   FERRITIN 382 (H) 10/18/2008   IRON <10 (L) 10/18/2008   TIBC NOT CALC Not calculated due to Iron <10. 10/18/2008   UIBC 156 10/18/2008   IRONPCTSAT NOT CALC Not calculated due to Iron <10. 10/18/2008   Lab Results  Component Value Date   RETICCTPCT 0.9 10/18/2008   RBC 4.42 02/19/2021   No results found for: KPAFRELGTCHN, LAMBDASER, KAPLAMBRATIO No results found for: Kandis Cocking, IGMSERUM No results found for: Odetta Pink, SPEI   Chemistry      Component Value Date/Time   NA 137 02/19/2021 0956   NA 140 01/20/2017 1416   NA 141 01/17/2016 1047   K 3.7 02/19/2021 0956   K 3.9 01/20/2017 1416   K 3.2 (L) 01/17/2016 1047   CL 100 02/19/2021 0956   CL 102 01/20/2017 1416   CO2 25 02/19/2021 0956   CO2 26 01/20/2017 1416   CO2 27 01/17/2016 1047   BUN 11 02/19/2021 0956   BUN 15 01/20/2017 1416   BUN 6.6 (L) 01/17/2016 1047   CREATININE 0.74 02/19/2021 0956   CREATININE 1.1 01/20/2017 1416   CREATININE 0.7 01/17/2016 1047       Component Value Date/Time   CALCIUM 9.9 02/19/2021 0956   CALCIUM 9.1 01/20/2017 1416   CALCIUM 8.7 01/17/2016 1047   ALKPHOS 108 02/19/2021 0956   ALKPHOS 84 01/20/2017 1416   ALKPHOS 90 01/17/2016 1047   AST 20 02/19/2021 0956   AST 34 01/17/2016 1047   ALT 16 02/19/2021 0956   ALT 35 01/20/2017 1416   ALT 38 01/17/2016 1047   BILITOT 0.5 02/19/2021 0956   BILITOT 0.42 01/17/2016 1047       Impression and Plan: Ms. Kohl is a very pleasant 61 yo caucasian female with long standing history of metastatic carcinoid with recurrent liver metastasis.   She is tachycardic.  However she is not irregular.  As such I do not think this is atrial fibrillation.  Again I am not sure that this is anything related to her having the neuroendocrine tumor.  We will have to see what her Chromogranin A is.  She has been getting that Somatuline.  She is off by maybe a week or so.  Again we will let her cardiologist know what is going on.  She had the MRI that was done 2 months ago.  This is quite encouraging.  I really talk to her about having the issue that she had.  I again told her that she does have other physicians.  Again, I know that we see her most the time.  However, we will know when there is a problem with her carcinoid.  When she had a few weeks ago was not from the carcinoid.  We will continue to  follow her along and give her the Somatuline monthly.  I will see her back in 3 months.    [name], MD 1/10/202311:07 AM

## 2021-02-21 LAB — CHROMOGRANIN A: Chromogranin A (ng/mL): 77.5 ng/mL (ref 0.0–101.8)

## 2021-02-22 ENCOUNTER — Telehealth: Payer: Self-pay

## 2021-02-22 NOTE — Telephone Encounter (Addendum)
Left message for pt to call back.    ----- Message from Lorretta Harp, MD sent at 02/19/2021 11:34 AM EST ----- Leah [name], I am happy to see Leah [name] back.  I will have my nurse arrange.  JJB ----- Message ----- From: [name], MD Sent: 02/19/2021  11:17 AM EST To: Lorretta Harp, MD  Jon: I do not know if you might be able to see Leah [name] at some point.  She has marked tachycardia.  She is not symptomatic with it although she does feel a little bit more fatigued and short of breath when she tries exercise.  Her heart rate is 110 in the office.  She is on Toprol.  Again not sure exactly what might be going on with her.  I know that she does see you.  I know that you are so good with all your patients.  If there is anything he can do to help her out that would be wonderful.  Leah [name]

## 2021-02-25 ENCOUNTER — Encounter: Payer: Self-pay | Admitting: Family

## 2021-03-01 ENCOUNTER — Telehealth: Payer: Self-pay

## 2021-03-01 NOTE — Telephone Encounter (Signed)
Left message for pt to call back for appointment.

## 2021-03-01 NOTE — Telephone Encounter (Signed)
Erroneous encounter

## 2021-03-03 ENCOUNTER — Other Ambulatory Visit: Payer: Self-pay | Admitting: Family Medicine

## 2021-03-03 DIAGNOSIS — J301 Allergic rhinitis due to pollen: Secondary | ICD-10-CM

## 2021-03-04 ENCOUNTER — Encounter (INDEPENDENT_AMBULATORY_CARE_PROVIDER_SITE_OTHER): Payer: Self-pay | Admitting: Family Medicine

## 2021-03-04 ENCOUNTER — Ambulatory Visit (INDEPENDENT_AMBULATORY_CARE_PROVIDER_SITE_OTHER): Payer: BC Managed Care – PPO | Admitting: Family Medicine

## 2021-03-04 ENCOUNTER — Other Ambulatory Visit: Payer: Self-pay

## 2021-03-04 VITALS — BP 134/75 | HR 80 | Temp 98.4°F | Ht 59.0 in | Wt 198.0 lb

## 2021-03-04 DIAGNOSIS — E1169 Type 2 diabetes mellitus with other specified complication: Secondary | ICD-10-CM

## 2021-03-04 DIAGNOSIS — E669 Obesity, unspecified: Secondary | ICD-10-CM

## 2021-03-04 DIAGNOSIS — R110 Nausea: Secondary | ICD-10-CM | POA: Diagnosis not present

## 2021-03-04 DIAGNOSIS — F3289 Other specified depressive episodes: Secondary | ICD-10-CM | POA: Diagnosis not present

## 2021-03-04 DIAGNOSIS — Z7984 Long term (current) use of oral hypoglycemic drugs: Secondary | ICD-10-CM

## 2021-03-04 DIAGNOSIS — C7B8 Other secondary neuroendocrine tumors: Secondary | ICD-10-CM | POA: Diagnosis not present

## 2021-03-04 DIAGNOSIS — Z6841 Body Mass Index (BMI) 40.0 and over, adult: Secondary | ICD-10-CM

## 2021-03-04 MED ORDER — SCOPOLAMINE 1 MG/3DAYS TD PT72: 1.0000 | MEDICATED_PATCH | TRANSDERMAL | 12 refills | Status: DC

## 2021-03-05 ENCOUNTER — Ambulatory Visit (INDEPENDENT_AMBULATORY_CARE_PROVIDER_SITE_OTHER): Payer: 59 | Admitting: Family Medicine

## 2021-03-05 ENCOUNTER — Encounter: Payer: Self-pay | Admitting: Physician Assistant

## 2021-03-06 NOTE — Progress Notes (Signed)
Chief Complaint:   OBESITY Leah [name] is here to discuss her progress with her obesity treatment plan along with follow-up of her obesity related diagnoses. See Medical Weight Management Flowsheet for complete bioelectrical impedance results.  Today's visit was #: 22 Starting weight: 226 lbs Starting date: 07/07/2019 Weight change since last visit: +3 lbs Total lbs lost to date: 28 lbs Total weight loss percentage to date: -12.39%  Nutrition Plan: Category 2 Plan for 60% of the time.  Activity: None.  Interim History: Verne says her arthritis pain has increased over the past 2 weeks.  No chemo x1 week - January.  She stopped Ozempic in mid December.    Assessment/Plan:   1. Nausea [name] stopped Ozempic due to nausea.  She continues to have intermittent nausea.  Plan:  Start scopolamine patch, as per below.  - Start scopolamine (TRANSDERM-SCOP) 1 MG/3DAYS; Place 1 patch (1.5 mg total) onto the skin every 3 (three) days.  Dispense: 10 patch; Refill: 12  2. Type 2 diabetes mellitus with other specified complication, without long-term current use of insulin (HCC) Diabetes Mellitus: Improving, but not optimized. Medication: metformin 500 mg twice daily. Issues reviewed: blood sugar goals, complications of diabetes mellitus, hypoglycemia prevention and treatment, exercise, and nutrition.  Plan: The patient will continue to focus on protein-rich, low simple carbohydrate foods. We reviewed the importance of hydration, regular exercise for stress reduction, and restorative sleep.   Lab Results  Component Value Date   HGBA1C 5.8 (H) 10/17/2020   HGBA1C 6.4 (H) 03/05/2020   HGBA1C 6.8 (H) 11/02/2019   Lab Results  Component Value Date   MICROALBUR 0.9 07/06/2019   LDLCALC 63 11/02/2019   CREATININE 0.74 02/19/2021   3. 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 Marin Olp.  4. Other depression, with emotional eating Medication: Effexor-XR  150 mg daily.   Plan: Discussed cues and consequences, how thoughts affect eating, model of thoughts, feelings, and behaviors, and strategies for change by focusing on the cue. Discussed cognitive distortions, coping thoughts, and how to change your thoughts.  5. Obesity BMI today is 40  Course: [name] is currently in the action stage of change. As such, her goal is to continue with weight loss efforts.   Nutrition goals: She has agreed to the Category 2 Plan.   Exercise goals:  As tolerated.  Behavioral modification strategies: increasing lean protein intake, decreasing simple carbohydrates, increasing vegetables, and increasing water intake.  Kiyomi has agreed to follow-up with our clinic in 4 weeks. She was informed of the importance of frequent follow-up visits to maximize her success with intensive lifestyle modifications for her multiple health conditions.   Objective:   Blood pressure 134/75, pulse 80, temperature 98.4 F (36.9 C), temperature source Oral, height 4\' 11"  (1.499 m), weight 198 lb (89.8 kg), SpO2 97 %. Body mass index is 39.99 kg/m.  General: Cooperative, alert, well developed, in no acute distress. HEENT: Conjunctivae and lids unremarkable. Cardiovascular: Regular rhythm.  Lungs: Normal work of breathing. Neurologic: No focal deficits.   Lab Results  Component Value Date   CREATININE 0.74 02/19/2021   BUN 11 02/19/2021   NA 137 02/19/2021   K 3.7 02/19/2021   CL 100 02/19/2021   CO2 25 02/19/2021   Lab Results  Component Value Date   ALT 16 02/19/2021   AST 20 02/19/2021   ALKPHOS 108 02/19/2021   BILITOT 0.5 02/19/2021   Lab Results  Component Value Date   HGBA1C  5.8 (H) 10/17/2020   HGBA1C 6.4 (H) 03/05/2020   HGBA1C 6.8 (H) 11/02/2019   HGBA1C 7.3 (H) 07/06/2019   HGBA1C 7.1 (H) 12/24/2018   Lab Results  Component Value Date   TSH 2.720 03/05/2020   Lab Results  Component Value Date   CHOL 140 11/02/2019   HDL 58 11/02/2019    LDLCALC 63 11/02/2019   TRIG 111 11/02/2019   CHOLHDL 2.4 11/02/2019   Lab Results  Component Value Date   VD25OH 50 07/22/2012   VD25OH 38 08/20/2010   Lab Results  Component Value Date   WBC 10.3 02/19/2021   HGB 12.3 02/19/2021   HCT 38.9 02/19/2021   MCV 88.0 02/19/2021   PLT 276 02/19/2021   Lab Results  Component Value Date   IRON <10 (L) 10/18/2008   TIBC NOT CALC Not calculated due to Iron <10. 10/18/2008   FERRITIN 382 (H) 10/18/2008   Attestation Statements:   Reviewed by clinician on day of visit: allergies, medications, problem list, medical history, surgical history, family history, social history, and previous encounter notes.  I, Water quality scientist, CMA, am acting as transcriptionist for Briscoe Deutscher, DO  I have reviewed the above documentation for accuracy and completeness, and I agree with the above. -  Briscoe Deutscher, DO, MS, FAAFP, DABOM - Family and Bariatric Medicine.

## 2021-03-11 DIAGNOSIS — G4733 Obstructive sleep apnea (adult) (pediatric): Secondary | ICD-10-CM | POA: Diagnosis not present

## 2021-03-14 NOTE — Progress Notes (Signed)
Leah [name] D.[facility] [place] Phone: [phone]   Assessment and Plan:     1. Osteoarthritis of right knee, unspecified osteoarthritis type 2. Chronic pain of right knee 3. Right hip pain 5. Somatic dysfunction of lumbar region 6. Somatic dysfunction of pelvic region 7. Somatic dysfunction of sacral region - Chronic with exacerbation, initial sports medicine visit - Multiple chronic musculoskeletal complaints with acute flares likely from acute flare of chronic right knee osteoarthritis causing pain into right hip, primarily posteriorly around right SI joint - Patient elected for CSI as she has received benefit from these in the past and has not had 1 in 3+ months.  Tolerated well per note below - Recommend discontinuing regular naproxen use due to long-term side effects.  Recommend transitioning to as needed use of naproxen and may add topical Voltaren gel - Cannot use Tylenol for day-to-day pain due to history of liver cancer with resection - Recommend OTC turmeric for anti-inflammatory properties - Recommend physical activity that is less weightbearing through knees which include stationary bike, elliptical, water aerobics  - Patient has received significant relief with OMT in the past.  Elects for repeat OMT today.  Tolerated well per note below. - Decision today to treat with OMT was based on Physical Exam  After verbal consent patient was treated with HVLA (high velocity low amplitude), ME (muscle energy), FPR (flex positional release), ST (soft tissue), PC/PD (Pelvic Compression/ Pelvic Decompression) techniques in cervical, rib, thoracic, lumbar, and pelvic areas. Patient tolerated the procedure well with improvement in symptoms.  Patient educated on potential side effects of soreness and recommended to rest, hydrate, and use Tylenol as needed for pain control.   Procedure: Knee Joint Injection Side:  Right Indication: Right knee osteoarthritis  Risks explained and consent was given verbally. The site was cleaned with alcohol prep. A needle was introduced with an anterio-lateral approach. Injection given using 81mL of 1% lidocaine without epinephrine and 95mL of kenalog 40mg /ml. This was well tolerated and resulted in symptomatic relief.  Needle was removed, hemostasis achieved, and post injection instructions were explained.   Pt was advised to call or return to clinic if these symptoms worsen or fail to improve as anticipated.   4. 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  Follow Up: In 3 weeks for reevaluation.- If musculoskeletal complaints do not resolve with conservative therapy, would recommend imaging to include x-rays and MRIs with contrast to rule out bony metastasis   Subjective:   I, [name], am serving as a Education administrator for [name]  Chief Complaint: hip pain   HPI:  03/15/2021 Patient is a 61 year old female complaining of hip pain. Patient states that she had left knee surgery and did PT and has right knee pain due to compensation is getting a cortisone surgery later today, has started limping she threw her hip out, right hip / low back is in pain due to all the compensation the hip low back  has been going on for about 2 days now, has been taking aleve about twice a day , has been heating with a little relief but it was temporary has  numbness and tingling all the time b/c of arthritis all over   Relevant Historical Information: Stage IV liver cancer s/p resection, left total knee replacement  Additional pertinent review of systems negative.   Current Outpatient Medications:    amLODipine (NORVASC) 10 MG tablet, TAKE 1 TABLET BY MOUTH EVERY DAY, Disp: 90 tablet, Rfl: 3   ciclopirox (PENLAC) 8 % solution, Apply topically daily., Disp: , Rfl:    clonazePAM (KLONOPIN) 0.5 MG tablet, Take 1 tablet (0.5 mg total) by mouth 2 (two) times daily., Disp: 60 tablet, Rfl: 2   fexofenadine (ALLEGRA) 180 MG tablet, TAKE 1 TABLET BY MOUTH EVERY DAY, Disp: 90 tablet, Rfl: 3   gabapentin (NEURONTIN) 100 MG capsule, TAKE 1 CAPSULE BY MOUTH EVERYDAY AT BEDTIME, Disp: 90 capsule, Rfl: 0   ketoconazole (NIZORAL) 2 % cream, Apply topically daily., Disp: , Rfl:    metFORMIN (GLUCOPHAGE) 500 MG tablet, Take 1 tablet (500 mg total) by mouth 2 (two) times daily with a meal., Disp: 180 tablet, Rfl: 3   methocarbamol (ROBAXIN) 500 MG tablet, Take 1 tablet (500 mg total) by mouth every 6 (six) hours as needed for muscle spasms., Disp: 40 tablet, Rfl: 0   metoprolol succinate (TOPROL-XL) 100 MG 24 hr tablet, TAKE 1 TABLET BY MOUTH EVERY EVENING WITH OR IMMEDIATELY FOLLOWING A MEAL, Disp: 90 tablet, Rfl: 1   montelukast (SINGULAIR) 10 MG tablet, TAKE 1 TABLET BY MOUTH AT BEDTIME, Disp: 90 tablet, Rfl: 1   ondansetron (ZOFRAN-ODT) 4 MG disintegrating tablet, TAKE 1 TABLET BY MOUTH EVERY 8 HOURS AS NEEDED, Disp: 30 tablet, Rfl: 1   pantoprazole (PROTONIX) 40 MG tablet, TAKE 1 TABLET BY MOUTH TWICE A DAY, Disp: 180 tablet, Rfl: 1   PROAIR HFA 108 (90 Base) MCG/ACT inhaler, INHALE 2 PUFFS INTO THE LUNGS EVERY 6 (SIX) HOURS AS NEEDED. FOR SHORTNESS OF BREATH., Disp: 6.7 Inhaler, Rfl: 4   rosuvastatin (CRESTOR) 10 MG tablet, TAKE 1 TABLET BY MOUTH EVERY DAY, Disp: 90 tablet, Rfl: 0   scopolamine (TRANSDERM-SCOP) 1 MG/3DAYS, Place 1 patch (1.5 mg total) onto the  skin every 3 (three) days., Disp: 10 patch, Rfl: 12   valACYclovir (VALTREX) 1000 MG tablet, Take 1,000 mg by mouth 2 (two) times daily., Disp: , Rfl:    valsartan (DIOVAN) 320 MG tablet, TAKE 1 TABLET BY MOUTH EVERY DAY, Disp: 90 tablet, Rfl: 0   venlafaxine XR (EFFEXOR-XR) 150 MG 24 hr capsule, TAKE 1 CAPSULE BY MOUTH EVERY DAY IN THE MORNING, Disp: 90 capsule, Rfl: 1   Objective:     Vitals:   03/15/21 0824  BP: 120/80  Pulse: 70  SpO2: 99%  Height: 4\' 11"  (1.499 m)      Body mass index is 39.99 kg/m.    Physical Exam:    General:  awake, alert oriented, no acute distress nontoxic Skin: no suspicious lesions or rashes Neuro:sensation intact, no deficits, strength 5/5 with no deficits, no atrophy, normal muscle tone Psych: No signs of anxiety, depression or other mood disorder  Right   knee: No swelling No deformity Neg fluid wave, joint milking ROM Flex 100, Ext 10 NTTP over the quad tendon, medial fem condyle, lat fem condyle, patella, plica, patella tendon, tibial tuberostiy, fibular head, posterior fossa, pes anserine bursa, gerdy's tubercle, medial jt line, lateral jt line  Neg anterior and posterior drawer Neg lachman Neg sag sign Negative varus stress Negative valgus stress Positive McMurray Positive Thessaly  Gait normal      OMT Physical Exam:  ASIS Compression Test: Positive Right   Sacrum: Positive sphinx, bilaterally tender sacral bases Lumbar: TTP paraspinal, L1-3 RRSL Pelvis: Right anterior innominate   Electronically signed by:  Leah [name] D.[facility] 9:23 AM 03/15/21

## 2021-03-15 ENCOUNTER — Ambulatory Visit (INDEPENDENT_AMBULATORY_CARE_PROVIDER_SITE_OTHER): Payer: BC Managed Care – PPO | Admitting: Sports Medicine

## 2021-03-15 ENCOUNTER — Other Ambulatory Visit: Payer: Self-pay

## 2021-03-15 VITALS — BP 120/80 | HR 70 | Ht 59.0 in

## 2021-03-15 DIAGNOSIS — B353 Tinea pedis: Secondary | ICD-10-CM | POA: Diagnosis not present

## 2021-03-15 DIAGNOSIS — M25551 Pain in right hip: Secondary | ICD-10-CM

## 2021-03-15 DIAGNOSIS — M1711 Unilateral primary osteoarthritis, right knee: Secondary | ICD-10-CM | POA: Diagnosis not present

## 2021-03-15 DIAGNOSIS — G8929 Other chronic pain: Secondary | ICD-10-CM

## 2021-03-15 DIAGNOSIS — C7B8 Other secondary neuroendocrine tumors: Secondary | ICD-10-CM

## 2021-03-15 DIAGNOSIS — M9904 Segmental and somatic dysfunction of sacral region: Secondary | ICD-10-CM

## 2021-03-15 DIAGNOSIS — M9905 Segmental and somatic dysfunction of pelvic region: Secondary | ICD-10-CM

## 2021-03-15 DIAGNOSIS — M25561 Pain in right knee: Secondary | ICD-10-CM | POA: Diagnosis not present

## 2021-03-15 DIAGNOSIS — M9903 Segmental and somatic dysfunction of lumbar region: Secondary | ICD-10-CM

## 2021-03-15 NOTE — Patient Instructions (Addendum)
Good to see you  Recommend using topical Voltaren gel for superficial pains  Recommend physical activity that is easier on knees (stationary bike, elliptical ,or water aerobics) Recommend discontinue use of naproxen   recommend transitioning to as needed use 1-2 time a week  3 week follow up

## 2021-03-19 ENCOUNTER — Other Ambulatory Visit: Payer: Self-pay | Admitting: Family Medicine

## 2021-03-19 ENCOUNTER — Other Ambulatory Visit: Payer: Self-pay | Admitting: Physician Assistant

## 2021-03-19 DIAGNOSIS — J301 Allergic rhinitis due to pollen: Secondary | ICD-10-CM

## 2021-03-19 DIAGNOSIS — M4722 Other spondylosis with radiculopathy, cervical region: Secondary | ICD-10-CM

## 2021-03-20 ENCOUNTER — Telehealth: Payer: Self-pay

## 2021-03-20 NOTE — Telephone Encounter (Signed)
MEDICATION: fexofenadine (ALLEGRA) 180 MG tablet  PHARMACY:  [id number] - [place] [address] Phone:  [phone]  Fax:  [phone]      Comments:   **Let patient know to contact pharmacy at the end of the day to make sure medication is ready. **  ** Please notify patient to allow 48-72 hours to process**  **Encourage patient to contact the pharmacy for refills or they can request refills through [facility]**

## 2021-03-21 ENCOUNTER — Other Ambulatory Visit: Payer: Self-pay

## 2021-03-21 DIAGNOSIS — J301 Allergic rhinitis due to pollen: Secondary | ICD-10-CM

## 2021-03-21 MED ORDER — FEXOFENADINE HCL 180 MG PO TABS: 180.0000 mg | ORAL_TABLET | Freq: Every day | ORAL | 3 refills | Status: DC

## 2021-03-21 NOTE — Telephone Encounter (Signed)
Rx sent in

## 2021-03-22 ENCOUNTER — Inpatient Hospital Stay: Payer: BC Managed Care – PPO | Attending: Hematology & Oncology

## 2021-03-22 ENCOUNTER — Other Ambulatory Visit: Payer: Self-pay

## 2021-03-22 VITALS — BP 148/75 | HR 74 | Temp 98.1°F | Resp 18

## 2021-03-22 DIAGNOSIS — C7B02 Secondary carcinoid tumors of liver: Secondary | ICD-10-CM | POA: Insufficient documentation

## 2021-03-22 DIAGNOSIS — C7B8 Other secondary neuroendocrine tumors: Secondary | ICD-10-CM

## 2021-03-22 DIAGNOSIS — C7A00 Malignant carcinoid tumor of unspecified site: Secondary | ICD-10-CM | POA: Insufficient documentation

## 2021-03-22 MED ORDER — LANREOTIDE ACETATE 120 MG/0.5ML ~~LOC~~ SOLN
120.0000 mg | Freq: Once | SUBCUTANEOUS | Status: AC
  Administered 2021-03-22: 120 mg via SUBCUTANEOUS
  Filled 2021-03-22: qty 120

## 2021-03-22 NOTE — Patient Instructions (Signed)
Lanreotide injection °What is this medication? °LANREOTIDE (lan REE oh tide) is used to reduce blood levels of growth hormone in patients with a condition called acromegaly. It also works to slow or stop tumor growth in patients with neuroendocrine tumors and treat carcinoid syndrome. °This medicine may be used for other purposes; ask your health care provider or pharmacist if you have questions. °COMMON BRAND NAME(S): Somatuline [place] °What should I tell my care team before I take this medication? °They need to know if you have any of these conditions: °diabetes °gallbladder disease °heart disease °kidney disease °liver disease °thyroid disease °an unusual or allergic reaction to lanreotide, other medicines, foods, dyes, or preservatives °pregnant or trying to get pregnant °breast-feeding °How should I use this medication? °This medicine is for injection under the skin. It is given by a health care professional in a hospital or clinic setting. °Contact your pediatrician or health care professional regarding the use of this medicine in children. Special care may be needed. °Overdosage: If you think you have taken too much of this medicine contact a poison control center or emergency room at once. °NOTE: This medicine is only for you. Do not share this medicine with others. °What if I miss a dose? °It is important not to miss your dose. Call your doctor or health care professional if you are unable to keep an appointment. °What may interact with this medication? °This medicine may interact with the following medications: °bromocriptine °cyclosporine °certain medicines for blood pressure, heart disease, irregular heart beat °certain medicines for diabetes °quinidine °terfenadine °This list may not describe all possible interactions. Give your health care provider a list of all the medicines, herbs, non-prescription drugs, or dietary supplements you use. Also tell them if you smoke, drink alcohol, or use illegal drugs.  Some items may interact with your medicine. °What should I watch for while using this medication? °Tell your doctor or healthcare professional if your symptoms do not start to get better or if they get worse. °Visit your doctor or health care professional for regular checks on your progress. Your condition will be monitored carefully while you are receiving this medicine. °This medicine may increase blood sugar. Ask your healthcare provider if changes in diet or medicines are needed if you have diabetes. °You may need blood work done while you are taking this medicine. °Women should inform their doctor if they wish to become pregnant or think they might be pregnant. There is a potential for serious side effects to an unborn child. Talk to your health care professional or pharmacist for more information. Do not breast-feed an infant while taking this medicine or for 6 months after stopping it. °This medicine has caused ovarian failure in some women. This medicine may interfere with the ability to have a child. Talk with your doctor or health care professional if you are concerned about your fertility. °What side effects may I notice from receiving this medication? °Side effects that you should report to your doctor or health care professional as soon as possible: °allergic reactions like skin rash, itching or hives, swelling of the face, lips, or tongue °increased blood pressure °severe stomach pain °signs and symptoms of hgh blood sugar such as being more thirsty or hungry or having to urinate more than normal. You may also feel very tired or have blurry vision. °signs and symptoms of low blood sugar such as feeling anxious; confusion; dizziness; increased hunger; unusually weak or tired; sweating; shakiness; cold; irritable; headache; blurred vision; fast   heartbeat; loss of consciousness °unusually slow heartbeat °Side effects that usually do not require medical attention (report to your doctor or health care  professional if they continue or are bothersome): °constipation °diarrhea °dizziness °headache °muscle pain °muscle spasms °nausea °pain, redness, or irritation at site where injected °This list may not describe all possible side effects. Call your doctor for medical advice about side effects. You may report side effects to FDA at 1-800-FDA-1088. °Where should I keep my medication? °This drug is given in a hospital or clinic and will not be stored at home. °NOTE: This sheet is a summary. It may not cover all possible information. If you have questions about this medicine, talk to your doctor, pharmacist, or health care provider. °© 2022 [employer] (2017-12-16 00:00:00) ° °

## 2021-03-25 ENCOUNTER — Other Ambulatory Visit: Payer: Self-pay | Admitting: Physician Assistant

## 2021-04-02 ENCOUNTER — Encounter: Payer: Self-pay | Admitting: Physician Assistant

## 2021-04-02 ENCOUNTER — Ambulatory Visit: Payer: BC Managed Care – PPO | Admitting: Physician Assistant

## 2021-04-04 ENCOUNTER — Ambulatory Visit (INDEPENDENT_AMBULATORY_CARE_PROVIDER_SITE_OTHER): Payer: BC Managed Care – PPO | Admitting: Family Medicine

## 2021-04-04 ENCOUNTER — Other Ambulatory Visit: Payer: Self-pay

## 2021-04-04 ENCOUNTER — Ambulatory Visit (INDEPENDENT_AMBULATORY_CARE_PROVIDER_SITE_OTHER): Payer: BC Managed Care – PPO | Admitting: Physician Assistant

## 2021-04-04 ENCOUNTER — Encounter: Payer: Self-pay | Admitting: Physician Assistant

## 2021-04-04 VITALS — BP 137/85 | HR 74 | Temp 98.0°F | Ht 59.0 in | Wt 202.0 lb

## 2021-04-04 DIAGNOSIS — M25551 Pain in right hip: Secondary | ICD-10-CM | POA: Diagnosis not present

## 2021-04-04 DIAGNOSIS — C7B8 Other secondary neuroendocrine tumors: Secondary | ICD-10-CM | POA: Diagnosis not present

## 2021-04-04 NOTE — Progress Notes (Signed)
Leah [name] D.[facility] [place] Phone: [phone]   Assessment and Plan:     1. Right hip pain 2. Osteoarthritis of right knee, unspecified osteoarthritis type 3. Chronic pain of right knee 4. 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  My interpretation: No acute fracture or dislocation.  No cortical irregularity seen consistent with metastasis  - Continue to turmeric and topical Voltaren gel as needed for pain relief -Recommend continued physical therapy that is low weightbearing including stationary bike, elliptical, water aerobics and starting HEP  Pertinent previous records reviewed include PCP note from 04/04/2021   Follow Up: 4 weeks for reevaluation.  If no improvement or worsening of symptoms, would consider MRI pelvis with contrast to rule out metastasis   Subjective:   I, Leah [name], am serving as a Education administrator for Doctor Leah [name]  Chief Complaint: hip pain   HPI:  03/15/2021 Patient is a 61 year old female complaining of hip pain. Patient states that she had left knee surgery and did PT and has right knee pain due to compensation is getting a cortisone surgery later today, has started limping she threw her hip out, right hip / low back is in pain due to all the compensation the hip low back has been going on for about 2 days now, has been taking aleve about twice a day , has been heating with a little relief but it was temporary has numbness and tingling all the time  b/c of arthritis all over    04/05/2021 Patient states that the hip has been hurting for about a week . PCP left a note for you from her visit yesterday    Relevant Historical Information: Stage IV liver cancer s/p resection, left total knee replacement  Additional pertinent review of systems negative.   Current Outpatient Medications:    amLODipine (NORVASC) 10 MG tablet, TAKE 1 TABLET BY MOUTH EVERY DAY, Disp: 90 tablet, Rfl: 3   ciclopirox (PENLAC) 8 % solution, Apply topically daily., Disp: , Rfl:    fexofenadine (ALLEGRA) 180 MG tablet, Take 1 tablet (180 mg total) by mouth daily., Disp: 90 tablet, Rfl: 3   gabapentin (NEURONTIN) 100 MG capsule, TAKE 1 CAPSULE BY MOUTH EVERYDAY AT BEDTIME, Disp: 90 capsule, Rfl: 0   ketoconazole (NIZORAL) 2 % cream, Apply topically daily., Disp: , Rfl:    metFORMIN (GLUCOPHAGE) 500 MG tablet, Take 1 tablet (500 mg total) by mouth 2 (two) times daily with a meal., Disp: 180 tablet, Rfl: 3   methocarbamol (ROBAXIN) 500 MG tablet, Take 1 tablet (500 mg total) by mouth every 6 (six) hours as needed for muscle spasms., Disp: 40 tablet, Rfl: 0   metoprolol succinate (TOPROL-XL) 100 MG 24 hr tablet, TAKE 1 TABLET BY MOUTH EVERY EVENING WITH OR IMMEDIATELY FOLLOWING A MEAL, Disp: 90 tablet, Rfl: 1   montelukast (SINGULAIR) 10 MG tablet, TAKE 1 TABLET BY MOUTH AT BEDTIME, Disp: 90 tablet, Rfl: 1   ondansetron (ZOFRAN-ODT) 4 MG disintegrating tablet, TAKE 1 TABLET BY MOUTH EVERY 8 HOURS AS NEEDED *INS MAX 18 TABS PER 21  DAYS*, Disp: 18 tablet, Rfl: 3   pantoprazole (PROTONIX) 40 MG tablet, TAKE 1 TABLET BY MOUTH TWICE A DAY, Disp: 180 tablet, Rfl: 1   PROAIR HFA 108 (90 Base) MCG/ACT inhaler, INHALE 2 PUFFS INTO THE LUNGS EVERY 6 (SIX) HOURS AS NEEDED. FOR SHORTNESS OF BREATH., Disp: 6.7 Inhaler, Rfl: 4   rosuvastatin (CRESTOR) 10 MG tablet, TAKE 1 TABLET BY MOUTH EVERY DAY, Disp: 90 tablet, Rfl: 0   scopolamine (TRANSDERM-SCOP) 1 MG/3DAYS, Place 1 patch (1.5 mg  total) onto the skin every 3 (three) days., Disp: 10 patch, Rfl: 12   valACYclovir (VALTREX) 1000 MG tablet, Take 1,000 mg by mouth 2 (two) times daily., Disp: , Rfl:    valsartan (DIOVAN) 320 MG tablet, TAKE 1 TABLET BY MOUTH EVERY DAY, Disp: 90 tablet, Rfl: 0   venlafaxine XR (EFFEXOR-XR) 150 MG 24 hr capsule, TAKE 1 CAPSULE BY MOUTH EVERY DAY IN THE MORNING, Disp: 90 capsule, Rfl: 1   clonazePAM (KLONOPIN) 0.5 MG tablet, Take 1 tablet (0.5 mg total) by mouth 2 (two) times daily., Disp: 60 tablet, Rfl: 2   Objective:     Vitals:   04/05/21 0941  BP: 122/80  Pulse: 79  SpO2: 97%  Weight: 200 lb (90.7 kg)  Height: 4\' 11"  (1.499 m)      Body mass index is 40.4 kg/m.    Physical Exam:    General: awake, alert, and oriented no acute distress, nontoxic Skin: no suspicious lesions or rashes Neuro:sensation intact distally with no dificits, normal muscle tone, no atrophy, strength 5/5 in all tested lower ext groups Psych: normal mood and affect, speech clear  Right hip: No deformity, swelling or wasting ROM Fexion 80, ext 15, IR 30, ER 35 TTP hip flexors, greater trochanter, gluteal musculature, SI joint NTTP  lumbar spine Negative log roll with FROM Negative FABER Negative FADIR Negative Piriformis test   Gait slow, favoring left leg   Electronically signed by:  Leah [name] D.[facility] 10:38 AM 04/05/21

## 2021-04-04 NOTE — Progress Notes (Signed)
Subjective:    Patient ID: Leah [name], female    DOB: 01/21/1961, 61 y.o.   MRN: [id number]  Chief Complaint  Patient presents with   Hip Pain    Radiates to calf and back    HPI Patient is in today for ?? Pain in her abdomen. Feels like there is pain "in my appendix." Worried about something internally going on like when she was diagnosed with liver tumors.   Sharp shooting pain with walking from back and R hip. Working with Dr. Glennon Mac and scheduled to see him tomorrow to discuss imaging about her hip.   No fever, chills, night sweats. No unintentional weight loss. No overwhelming fatigue. No hematochezia. No nausea / vomiting.   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[place])    Constipation    Diabetes ([place])    Dyspnea    Fatty liver    Gallbladder problem    GE reflux    Hepatic encephalopathy 2011   History of PCR DNA positive for HSV1    History of retinal vein occlusion, right eye    HTN (hypertension) 2011   Joint pain    Liver problem    Migraines    Neuroendocrine tumor 2011   Orthostatic hypotension    Osteoarthritis    Osteopenia    Palpitations    Sarcoidosis 2008   Sleep apnea    uses cpap   Surgical menopause     Past Surgical History:  Procedure Laterality Date   ABDOMINAL HYSTERECTOMY  2004   AUGMENTATION MAMMAPLASTY     BREAST BIOPSY Left    BREAST RECONSTRUCTION Bilateral 05/07/2020   Procedure: Revision of bilateral breast reconstruction;  Surgeon: Cindra Presume, MD;  Location: [facility];  Service: Plastics;  Laterality: Bilateral;  2 hours   CESAREAN SECTION  [id number]   CHOLECYSTECTOMY  2006   HERNIA REPAIR     KNEE ARTHROSCOPY Left 09/2010   LAPAROSCOPIC HEPATECTOMY     LIPOSUCTION Bilateral 05/07/2020   Procedure: LIPOSUCTION with excision of lateral breast and axillary tissue;  Surgeon: Cindra Presume, MD;  Location: [facility];  Service: Plastics;  Laterality: Bilateral;   LIVER SURGERY     MASTECTOMY Bilateral 2008   OOPHORECTOMY  2004   RECONSTRUCTION BREAST W/ LATISSIMUS DORSI FLAP Bilateral    TONSILLECTOMY  1976   TOTAL KNEE ARTHROPLASTY Left 10/29/2020   Procedure: TOTAL KNEE ARTHROPLASTY;  Surgeon: Gaynelle Arabian, MD;  Location: [facility];  Service: Orthopedics;  Laterality: Left;   TUBAL LIGATION Bilateral 2001    Family History  Problem Relation Age of Onset   Arthritis Maternal Grandmother    Arthritis Maternal Grandfather    Arthritis Paternal Grandmother    Arthritis Paternal Grandfather    Heart failure Paternal Grandfather    Breast cancer Mother 27   Lupus Mother    Heart failure Father    Hypertension Father    Hyperlipidemia Father    Sudden death Father    Obesity Father    Breast cancer Maternal Aunt 38   Ovarian cancer Maternal Aunt 60   Breast cancer Maternal Aunt 2    Social History   Tobacco Use   Smoking status: Never   Smokeless tobacco: Never   Tobacco comments:    never used tobacco  Vaping  Use   Vaping Use: Never used  Substance Use Topics   Alcohol use: No    Alcohol/week: 0.0 standard drinks   Drug use: No     No Known Allergies  Review of Systems NEGATIVE UNLESS OTHERWISE INDICATED IN HPI      Objective:     BP 137/85    Pulse 74    Temp 98 F (36.7 C)    Ht 4\' 11"  (1.499 m)    Wt 202 lb (91.6 kg)    SpO2 96%    BMI 40.80 kg/m   Wt Readings from Last 3 Encounters:  04/05/21 200 lb (90.7 kg)  04/04/21 202 lb (91.6 kg)  03/04/21 198 lb (89.8 kg)    BP Readings from Last 3 Encounters:  04/05/21 122/80  04/04/21 137/85  03/22/21 (!) 148/75     Physical Exam Vitals and nursing note reviewed.  Constitutional:      General: She is not in acute distress.    Appearance: Normal appearance. She is obese. She is not ill-appearing.  HENT:     Head: Normocephalic.     Right Ear: External ear  normal.     Left Ear: External ear normal.     Nose: Nose normal.     Mouth/Throat:     Mouth: Mucous membranes are moist.  Eyes:     Extraocular Movements: Extraocular movements intact.     Conjunctiva/sclera: Conjunctivae normal.     Pupils: Pupils are equal, round, and reactive to light.  Cardiovascular:     Rate and Rhythm: Normal rate and regular rhythm.     Pulses: Normal pulses.     Heart sounds: No murmur heard. Pulmonary:     Effort: Pulmonary effort is normal.     Breath sounds: Normal breath sounds.  Abdominal:     General: Abdomen is flat. Bowel sounds are normal. There is no distension.     Palpations: Abdomen is soft. There is no mass.     Tenderness: There is no abdominal tenderness. There is no right CVA tenderness, left CVA tenderness, guarding or rebound.  Musculoskeletal:        General: Normal range of motion.     Cervical back: Normal range of motion.     Right hip: Normal strength.     Left hip: Normal strength.  Skin:    General: Skin is warm.  Neurological:     General: No focal deficit present.     Mental Status: She is alert and oriented to person, place, and time.     Gait: Gait normal.  Psychiatric:        Mood and Affect: Mood normal.        Behavior: Behavior normal.       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 Abdominal exam is completely benign.  -I reassured patient that based on her symptoms and exam, her pain seems MSK in nature coming from her hip and knees. -She will see Dr. Glennon Mac tomorrow and I do suggest she proceed with XRAY imaging in his office. -She will f/up with me if any changes  Clydene Burack M Tanara Turvey, PA-C

## 2021-04-05 ENCOUNTER — Ambulatory Visit (INDEPENDENT_AMBULATORY_CARE_PROVIDER_SITE_OTHER): Payer: BC Managed Care – PPO | Admitting: Sports Medicine

## 2021-04-05 ENCOUNTER — Encounter: Payer: Self-pay | Admitting: *Deleted

## 2021-04-05 ENCOUNTER — Ambulatory Visit (INDEPENDENT_AMBULATORY_CARE_PROVIDER_SITE_OTHER): Payer: BC Managed Care – PPO

## 2021-04-05 VITALS — BP 122/80 | HR 79 | Ht 59.0 in | Wt 200.0 lb

## 2021-04-05 DIAGNOSIS — C7B8 Other secondary neuroendocrine tumors: Secondary | ICD-10-CM | POA: Diagnosis not present

## 2021-04-05 DIAGNOSIS — M25551 Pain in right hip: Secondary | ICD-10-CM

## 2021-04-05 DIAGNOSIS — M25561 Pain in right knee: Secondary | ICD-10-CM | POA: Diagnosis not present

## 2021-04-05 DIAGNOSIS — G8929 Other chronic pain: Secondary | ICD-10-CM

## 2021-04-05 DIAGNOSIS — M1711 Unilateral primary osteoarthritis, right knee: Secondary | ICD-10-CM

## 2021-04-05 DIAGNOSIS — B353 Tinea pedis: Secondary | ICD-10-CM | POA: Diagnosis not present

## 2021-04-05 NOTE — Patient Instructions (Addendum)
Good to see you  Hip and low back HEP  Continue  tumeric and topical Voltaren gel  4 week follow up

## 2021-04-05 NOTE — Progress Notes (Signed)
Fax received from [facility] requesting pathology result from 10/17/2008.  Results routed to [phone].

## 2021-04-08 ENCOUNTER — Other Ambulatory Visit: Payer: Self-pay | Admitting: Physician Assistant

## 2021-04-08 DIAGNOSIS — J301 Allergic rhinitis due to pollen: Secondary | ICD-10-CM

## 2021-04-09 DIAGNOSIS — G4733 Obstructive sleep apnea (adult) (pediatric): Secondary | ICD-10-CM | POA: Diagnosis not present

## 2021-04-10 ENCOUNTER — Other Ambulatory Visit: Payer: Self-pay | Admitting: Physician Assistant

## 2021-04-10 DIAGNOSIS — I10 Essential (primary) hypertension: Secondary | ICD-10-CM

## 2021-04-10 NOTE — Telephone Encounter (Signed)
The original prescription was discontinued on 10/30/2020 by Derl Barrow, PA for the following reason: Stop Taking at Discharge. Renewing this prescription may not be appropriate. ?

## 2021-04-12 ENCOUNTER — Other Ambulatory Visit: Payer: Self-pay | Admitting: Physician Assistant

## 2021-04-14 ENCOUNTER — Other Ambulatory Visit: Payer: Self-pay | Admitting: Physician Assistant

## 2021-04-14 ENCOUNTER — Other Ambulatory Visit (INDEPENDENT_AMBULATORY_CARE_PROVIDER_SITE_OTHER): Payer: Self-pay | Admitting: Family Medicine

## 2021-04-14 DIAGNOSIS — F419 Anxiety disorder, unspecified: Secondary | ICD-10-CM

## 2021-04-15 NOTE — Telephone Encounter (Signed)
Dr.wallace °

## 2021-04-19 ENCOUNTER — Inpatient Hospital Stay: Payer: BC Managed Care – PPO | Attending: Hematology & Oncology

## 2021-04-19 ENCOUNTER — Other Ambulatory Visit: Payer: Self-pay

## 2021-04-19 VITALS — BP 138/69 | HR 75 | Temp 98.9°F | Resp 17

## 2021-04-19 DIAGNOSIS — C7B02 Secondary carcinoid tumors of liver: Secondary | ICD-10-CM | POA: Diagnosis not present

## 2021-04-19 DIAGNOSIS — C7A00 Malignant carcinoid tumor of unspecified site: Secondary | ICD-10-CM | POA: Insufficient documentation

## 2021-04-19 DIAGNOSIS — C7B8 Other secondary neuroendocrine tumors: Secondary | ICD-10-CM

## 2021-04-19 MED ORDER — LANREOTIDE ACETATE 120 MG/0.5ML ~~LOC~~ SOLN
120.0000 mg | Freq: Once | SUBCUTANEOUS | Status: AC
  Administered 2021-04-19: 120 mg via SUBCUTANEOUS
  Filled 2021-04-19: qty 120

## 2021-04-19 NOTE — Patient Instructions (Signed)
Lanreotide injection °What is this medication? °LANREOTIDE (lan REE oh tide) is used to reduce blood levels of growth hormone in patients with a condition called acromegaly. It also works to slow or stop tumor growth in patients with neuroendocrine tumors and treat carcinoid syndrome. °This medicine may be used for other purposes; ask your health care provider or pharmacist if you have questions. °COMMON BRAND NAME(S): Somatuline [place] °What should I tell my care team before I take this medication? °They need to know if you have any of these conditions: °diabetes °gallbladder disease °heart disease °kidney disease °liver disease °thyroid disease °an unusual or allergic reaction to lanreotide, other medicines, foods, dyes, or preservatives °pregnant or trying to get pregnant °breast-feeding °How should I use this medication? °This medicine is for injection under the skin. It is given by a health care professional in a hospital or clinic setting. °Contact your pediatrician or health care professional regarding the use of this medicine in children. Special care may be needed. °Overdosage: If you think you have taken too much of this medicine contact a poison control center or emergency room at once. °NOTE: This medicine is only for you. Do not share this medicine with others. °What if I miss a dose? °It is important not to miss your dose. Call your doctor or health care professional if you are unable to keep an appointment. °What may interact with this medication? °This medicine may interact with the following medications: °bromocriptine °cyclosporine °certain medicines for blood pressure, heart disease, irregular heart beat °certain medicines for diabetes °quinidine °terfenadine °This list may not describe all possible interactions. Give your health care provider a list of all the medicines, herbs, non-prescription drugs, or dietary supplements you use. Also tell them if you smoke, drink alcohol, or use illegal drugs.  Some items may interact with your medicine. °What should I watch for while using this medication? °Tell your doctor or healthcare professional if your symptoms do not start to get better or if they get worse. °Visit your doctor or health care professional for regular checks on your progress. Your condition will be monitored carefully while you are receiving this medicine. °This medicine may increase blood sugar. Ask your healthcare provider if changes in diet or medicines are needed if you have diabetes. °You may need blood work done while you are taking this medicine. °Women should inform their doctor if they wish to become pregnant or think they might be pregnant. There is a potential for serious side effects to an unborn child. Talk to your health care professional or pharmacist for more information. Do not breast-feed an infant while taking this medicine or for 6 months after stopping it. °This medicine has caused ovarian failure in some women. This medicine may interfere with the ability to have a child. Talk with your doctor or health care professional if you are concerned about your fertility. °What side effects may I notice from receiving this medication? °Side effects that you should report to your doctor or health care professional as soon as possible: °allergic reactions like skin rash, itching or hives, swelling of the face, lips, or tongue °increased blood pressure °severe stomach pain °signs and symptoms of hgh blood sugar such as being more thirsty or hungry or having to urinate more than normal. You may also feel very tired or have blurry vision. °signs and symptoms of low blood sugar such as feeling anxious; confusion; dizziness; increased hunger; unusually weak or tired; sweating; shakiness; cold; irritable; headache; blurred vision; fast   heartbeat; loss of consciousness °unusually slow heartbeat °Side effects that usually do not require medical attention (report to your doctor or health care  professional if they continue or are bothersome): °constipation °diarrhea °dizziness °headache °muscle pain °muscle spasms °nausea °pain, redness, or irritation at site where injected °This list may not describe all possible side effects. Call your doctor for medical advice about side effects. You may report side effects to FDA at 1-800-FDA-1088. °Where should I keep my medication? °This drug is given in a hospital or clinic and will not be stored at home. °NOTE: This sheet is a summary. It may not cover all possible information. If you have questions about this medicine, talk to your doctor, pharmacist, or health care provider. °© 2022 [employer] (2017-12-16 00:00:00) ° °

## 2021-04-20 DIAGNOSIS — E119 Type 2 diabetes mellitus without complications: Secondary | ICD-10-CM | POA: Diagnosis not present

## 2021-04-23 ENCOUNTER — Ambulatory Visit (INDEPENDENT_AMBULATORY_CARE_PROVIDER_SITE_OTHER): Payer: BC Managed Care – PPO | Admitting: Physician Assistant

## 2021-04-23 ENCOUNTER — Encounter: Payer: Self-pay | Admitting: Physician Assistant

## 2021-04-23 VITALS — BP 133/79 | HR 76 | Temp 98.2°F | Ht 59.0 in | Wt 207.0 lb

## 2021-04-23 DIAGNOSIS — I152 Hypertension secondary to endocrine disorders: Secondary | ICD-10-CM

## 2021-04-23 DIAGNOSIS — F3289 Other specified depressive episodes: Secondary | ICD-10-CM

## 2021-04-23 DIAGNOSIS — F419 Anxiety disorder, unspecified: Secondary | ICD-10-CM | POA: Diagnosis not present

## 2021-04-23 DIAGNOSIS — E1169 Type 2 diabetes mellitus with other specified complication: Secondary | ICD-10-CM | POA: Diagnosis not present

## 2021-04-23 DIAGNOSIS — Z23 Encounter for immunization: Secondary | ICD-10-CM | POA: Diagnosis not present

## 2021-04-23 DIAGNOSIS — E1159 Type 2 diabetes mellitus with other circulatory complications: Secondary | ICD-10-CM | POA: Diagnosis not present

## 2021-04-23 DIAGNOSIS — I10 Essential (primary) hypertension: Secondary | ICD-10-CM

## 2021-04-23 LAB — POCT GLYCOSYLATED HEMOGLOBIN (HGB A1C): Hemoglobin A1C: 6.2 % — AB (ref 4.0–5.6)

## 2021-04-23 NOTE — Patient Instructions (Addendum)
Check with your pharmacy about 2nd Shingles vaccine status. ? ?Updated Tdap in office today. ? ?Please have ophthalmologist send report to our office.  ? ?Please look into prior colonoscopy and have report sent to [place] as well.  ? ?Due for mammogram this year.  ? ?Ha1c is 6.2.  Keep working on lifestyle changes.  ? ?Cont current medication regimen. ?

## 2021-04-23 NOTE — Progress Notes (Signed)
? ?Subjective:  ? ? Patient ID: Leah [name], female    DOB: 1960-05-09, 61 y.o.   MRN: [id number] ? ?Chief Complaint  ?Patient presents with  ? Follow-up  ? ? ?HPI ?Patient is in today for f/up on chronic concerns. She is not fasting today. See A/P.   ? ?She takes Metformin 500 mg BID. She has been unable to exercise due to R hip and knee pain. Nutrition could be better, has not been as mindful about her foods lately. Hx of arthritis and sometimes has pain / tingling in hands and feet, hard to determine which is which. Uses Voltaren gel to help. Sees podiatry for some fungal infection. Sees ophthalmology annually.  ? ?Need to update Tetanus shot today.  ? ?Past Medical History:  ?Diagnosis Date  ? Allergic rhinitis   ? Anxiety   ? Atrophic vaginitis   ? Back pain   ? Borderline diabetes 03/17/2013  ? 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 Constipation   ? Diabetes ([place])   ? Dyspnea   ? Fatty liver   ? Gallbladder problem   ? GE reflux   ? Hepatic encephalopathy 2011  ? History of PCR DNA positive for HSV1   ? History of retinal vein occlusion, right eye   ? HTN (hypertension) 2011  ? Joint pain   ? Liver problem   ? Migraines   ? Neuroendocrine tumor 2011  ? Orthostatic hypotension   ? Osteoarthritis   ? Osteopenia   ? Palpitations   ? Sarcoidosis 2008  ? Sleep apnea   ? uses cpap  ? Surgical menopause   ? ? ?Past Surgical History:  ?Procedure Laterality Date  ? ABDOMINAL HYSTERECTOMY  2004  ? AUGMENTATION MAMMAPLASTY    ? BREAST BIOPSY Left   ? BREAST RECONSTRUCTION Bilateral 05/07/2020  ? Procedure: Revision of bilateral breast reconstruction;  Surgeon: Cindra Presume, MD;  Location: [place];  Service: Plastics;  Laterality: Bilateral;  2 hours  ? CESAREAN SECTION  [phone]  ? CHOLECYSTECTOMY  2006  ? HERNIA REPAIR    ? KNEE ARTHROSCOPY Left 09/2010  ? LAPAROSCOPIC HEPATECTOMY    ? LIPOSUCTION Bilateral 05/07/2020  ? Procedure: LIPOSUCTION with excision of lateral breast and axillary tissue;  Surgeon:  Cindra Presume, MD;  Location: [place];  Service: Plastics;  Laterality: Bilateral;  ? LIVER SURGERY    ? MASTECTOMY Bilateral 2008  ? OOPHORECTOMY  2004  ? RECONSTRUCTION BREAST W/ LATISSIMUS DORSI FLAP Bilateral   ? TONSILLECTOMY  1976  ? TOTAL KNEE ARTHROPLASTY Left 10/29/2020  ? Procedure: TOTAL KNEE ARTHROPLASTY;  Surgeon: Gaynelle Arabian, MD;  Location: [facility];  Service: Orthopedics;  Laterality: Left;  ? TUBAL LIGATION Bilateral 2001  ? ? ?Family History  ?Problem Relation Age of Onset  ? Arthritis Maternal Grandmother   ? Arthritis Maternal Grandfather   ? Arthritis Paternal Grandmother   ? Arthritis Paternal Grandfather   ? Heart failure Paternal Grandfather   ? Breast cancer Mother 34  ? Lupus Mother   ? Heart failure Father   ? Hypertension Father   ? Hyperlipidemia Father   ? Sudden death Father   ? Obesity Father   ? Breast cancer Maternal Aunt 38  ? Ovarian cancer Maternal Aunt 60  ? Breast cancer Maternal Aunt 2  ? ? ?Social History  ? ?Tobacco Use  ? Smoking status: Never  ? Smokeless tobacco: Never  ? Tobacco comments:  ?  never used  tobacco  ?Vaping Use  ? Vaping Use: Never used  ?Substance Use Topics  ? Alcohol use: No  ?  Alcohol/week: 0.0 standard drinks  ? Drug use: No  ?  ? ?No Known Allergies ? ?Review of Systems ?NEGATIVE UNLESS OTHERWISE INDICATED IN HPI ? ? ?   ?Objective:  ?  ? ?BP 133/79   Pulse 76   Temp 98.2 ?F (36.8 ?C)   Ht '4\' 11"'$  (1.499 m)   Wt 207 lb (93.9 kg)   SpO2 97%   BMI 41.81 kg/m?  ? ?Wt Readings from Last 3 Encounters:  ?04/23/21 207 lb (93.9 kg)  ?04/05/21 200 lb (90.7 kg)  ?04/04/21 202 lb (91.6 kg)  ? ? ?BP Readings from Last 3 Encounters:  ?04/23/21 133/79  ?04/19/21 138/69  ?04/05/21 122/80  ?  ? ?Physical Exam ?Vitals and nursing note reviewed.  ?Constitutional:   ?   General: She is not in acute distress. ?   Appearance: Normal appearance. She is obese. She is not ill-appearing.  ?HENT:  ?   Head: Normocephalic.  ?   Right Ear: External ear  normal.  ?   Left Ear: External ear normal.  ?   Nose: Nose normal.  ?   Mouth/Throat:  ?   Mouth: Mucous membranes are moist.  ?Eyes:  ?   Extraocular Movements: Extraocular movements intact.  ?   Conjunctiva/sclera: Conjunctivae normal.  ?   Pupils: Pupils are equal, round, and reactive to light.  ?Cardiovascular:  ?   Rate and Rhythm: Normal rate and regular rhythm.  ?   Pulses: Normal pulses.  ?   Heart sounds: No murmur heard. ?Pulmonary:  ?   Effort: Pulmonary effort is normal.  ?   Breath sounds: Normal breath sounds.  ?Musculoskeletal:     ?   General: Normal range of motion.  ?   Cervical back: Normal range of motion.  ?   Right hip: Normal strength.  ?   Left hip: Normal strength.  ?Skin: ?   General: Skin is warm.  ?Neurological:  ?   General: No focal deficit present.  ?   Mental Status: She is alert and oriented to person, place, and time.  ?   Gait: Gait normal.  ?Psychiatric:     ?   Mood and Affect: Mood normal.     ?   Behavior: Behavior normal.  ? ? ?   ?Assessment & Plan:  ? ?Problem List Items Addressed This Visit   ? ?  ? Cardiovascular and Mediastinum  ? Hypertension associated with type 2 diabetes mellitus ([place])  ?  ? Endocrine  ? Type 2 diabetes mellitus with other specified complication ([place]) - Primary  ? Relevant Orders  ? POCT HgB A1C (Completed)  ? Tdap vaccine greater than or equal to 7yo IM (Completed)  ?  ? Other  ? Anxiety  ? Depression  ? ?Other Visit Diagnoses   ? ? Need for diphtheria-tetanus-pertussis (Tdap) vaccine      ? Relevant Orders  ? Tdap vaccine greater than or equal to 7yo IM (Completed)  ? ?  ? ?1. Type 2 diabetes mellitus with other specified complication, without long-term current use of insulin ([place]) ?Lab Results  ?Component Value Date  ? HGBA1C 6.2 (A) 04/23/2021  ? ?6 months ago A1c was 5.8. ?Continue on metformin 500 mg BID. ?Cont working on lifestyle changes. ?Needs ophthalmology report sent to [place].  ? ?2. Hypertension associated with type 2 diabetes mellitus  (  HCC) ?Stable, to goal ?Norvasc 10 mg ?Toprol XL 100 mg  ?Valsartan 320 mg  ?Low salt diet ? ?3. Anxiety ?4. Other depression ?Stable ?Cont Effexor XR 150 mg daily ?Klonopin 0.5 mg BID prn anxiety ?PDMP reviewed today, no red flags, filling appropriately.  ? ? ?5. Need for diphtheria-tetanus-pertussis (Tdap) vaccine ?Updated today ? ? ?An After Visit Summary was printed and given to the patient. Needs Shingrix vaccine, colonoscopy report sent to [place]. Due for mammogram this year. ? ?Recheck in 6 months or prn.  ? ? ? ?Sonika Levins M Emogene Muratalla, PA-C ?

## 2021-04-24 ENCOUNTER — Encounter: Payer: Self-pay | Admitting: Physician Assistant

## 2021-04-26 DIAGNOSIS — B353 Tinea pedis: Secondary | ICD-10-CM | POA: Diagnosis not present

## 2021-04-30 ENCOUNTER — Telehealth: Payer: Self-pay | Admitting: Gastroenterology

## 2021-04-30 NOTE — Telephone Encounter (Signed)
Good morning, Dr. Tarri Glenn, ? ?This patient has had her records sent here from [facility] and is requesting a transfer of care to you.  There are also two other of her family members that will also be requesting the transfer. She saw Dr. Cristina Gong, who has left the practice, and she did not want to see another doctor within that practice.  She states all of her other healthcare is within the [place] system and she wants to keep everything all together.  Her records are being sent to you through interoffice mail by Pamala Hurry and you should receive them this afternoon.  Please let me kow if you approve the transfer. ? ?Thank you. ?

## 2021-04-30 NOTE — Telephone Encounter (Signed)
I will look for those records. Thanks.  

## 2021-05-01 NOTE — Telephone Encounter (Signed)
25 pages of records reviewed. No records from Dr. Cristina Gong included.  ? ?Previously seen by Dr. Watt Climes.  Last office note from 2017.  Under evaluation for reflux esophagitis and abdominal pain.  Prior office visits were for constipation and diarrhea.  Dr. Watt Climes felt this was related to a ventral hernia encouraged her to see her oncologist and her surgeon. ? ?Past medical history: HTN, GERD, sarcoidosis, Stage IV neuroendocrine tumor ([facility] liver for neuroendocrine tumor in 2011 and right partial hepatectomy 2012), hepatic steatosis, arthritis, headaches, vertigo ? ?Seen annually by her surgeon at [facility], most recently 12/25/2020.  MRI findings were stable at that time.  She is currently on octreotide monthly. ? ?Review of CareEverywhere shows that she had was supposed to have a colonoscopy in 2021.  Those records are not included in the records reviewed today. ? ?

## 2021-05-01 NOTE — Telephone Encounter (Signed)
Hi Dr. Tarri Glenn, ? ?I just called patient and asked about the records from Dr. Cristina Gong, which is who she told me she saw, but she said she actually had never seen him and it was Dr. Watt Climes that she'd seen there.  I am sorry for the confusion, but hopefully, the records you have now is all there is. ? ?Thank you. ?

## 2021-05-03 NOTE — Telephone Encounter (Signed)
Additional records show EGD with Dr. Benson Norway 04/13/2008 suggested GERD and a 52m cardia polyp.  ? ?No colonoscopy results included.  ?

## 2021-05-04 ENCOUNTER — Other Ambulatory Visit: Payer: Self-pay | Admitting: Physician Assistant

## 2021-05-07 ENCOUNTER — Ambulatory Visit (INDEPENDENT_AMBULATORY_CARE_PROVIDER_SITE_OTHER): Payer: BC Managed Care – PPO | Admitting: Family Medicine

## 2021-05-07 ENCOUNTER — Encounter (INDEPENDENT_AMBULATORY_CARE_PROVIDER_SITE_OTHER): Payer: Self-pay | Admitting: Family Medicine

## 2021-05-07 ENCOUNTER — Other Ambulatory Visit: Payer: Self-pay

## 2021-05-07 VITALS — BP 139/82 | HR 93 | Temp 97.8°F | Ht 59.0 in | Wt 201.0 lb

## 2021-05-07 DIAGNOSIS — E785 Hyperlipidemia, unspecified: Secondary | ICD-10-CM | POA: Diagnosis not present

## 2021-05-07 DIAGNOSIS — E1169 Type 2 diabetes mellitus with other specified complication: Secondary | ICD-10-CM

## 2021-05-07 DIAGNOSIS — E1159 Type 2 diabetes mellitus with other circulatory complications: Secondary | ICD-10-CM

## 2021-05-07 DIAGNOSIS — Z7984 Long term (current) use of oral hypoglycemic drugs: Secondary | ICD-10-CM

## 2021-05-07 DIAGNOSIS — E669 Obesity, unspecified: Secondary | ICD-10-CM

## 2021-05-07 DIAGNOSIS — I152 Hypertension secondary to endocrine disorders: Secondary | ICD-10-CM | POA: Diagnosis not present

## 2021-05-07 DIAGNOSIS — Z6841 Body Mass Index (BMI) 40.0 and over, adult: Secondary | ICD-10-CM

## 2021-05-08 NOTE — Progress Notes (Signed)
? ? Leah [name] Leah [name] ?[facility] ?[place] ?Phone: [phone] ?  ?Assessment and Plan:   ?  ?1. Right hip pain ?2. Osteoarthritis of right knee, unspecified osteoarthritis type ?3. Chronic pain of right knee ?4. 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  We will not proceed with MRI to rule out metastatic tumor at this time due to her improvement.  Should this pain recur or significantly worsen, could consider MRI with IV contrast at that time ?- Patient has had a recurrent flare in her chronic knee pain likely due to osteoarthritis.  Her last CSI was 03/15/2021, so we will schedule follow-up for 06/12/2021 or later for repeat injection ?- Continue tumeric and topical Voltaren gel as needed for pain relief ?- Continue with weight loss management and activity that is low weightbearing including stationary bike, elliptical, water aerobics ?  ?Pertinent previous records reviewed include none ?  ?Follow Up: Her last CSI was 03/15/2021, so we will schedule follow-up for 06/12/2021 or later for repeat injection  ? ?  ?Subjective:   ?I, [name], am serving as a Education administrator for [employer] ? ?Chief Complaint: right hip pain  ? ?HPI:  ?03/15/2021 ?Patient is a 61 year old female complaining of hip pain. Patient states that she had left knee surgery and did PT and has right knee pain due to compensation is getting a cortisone surgery later today, has started limping she threw her hip out, right hip / low back is in pain due to all the compensation the hip low back has been going on for about 2 days now, has been taking aleve about twice a day , has been heating with a little relief but it was temporary has numbness and tingling all the time b/c of arthritis all over  ?  ?04/05/2021 ?Patient states that the hip has  been hurting for about a week . PCP left a note for you from her visit yesterday  ? ?05/09/2021 ?Patient states that she is doing better than where she was now its just her knee bothering her, anterior knee pain  ? ?  ?Relevant Historical Information: Stage IV liver cancer s/p resection, left total knee replacement ? ?Additional pertinent review of systems negative. ? ? ?Current Outpatient Medications:  ?  amLODipine (NORVASC) 10 MG tablet, TAKE 1 TABLET BY MOUTH EVERY DAY, Disp: 90 tablet, Rfl: 3 ?  aspirin 81 MG EC tablet, TAKE 1 TABLET BY MOUTH EVERY DAY *SWALLOW WHOLE*, Disp: 90 tablet, Rfl: 1 ?  ciclopirox (PENLAC) 8 % solution, Apply topically daily., Disp: , Rfl:  ?  fexofenadine (ALLEGRA) 180 MG tablet, Take 1 tablet (180 mg total) by mouth daily., Disp: 90 tablet, Rfl: 3 ?  gabapentin (NEURONTIN) 100 MG capsule, TAKE 1 CAPSULE BY MOUTH EVERYDAY AT BEDTIME, Disp: 90 capsule, Rfl: 0 ?  ketoconazole (NIZORAL) 2 % cream, Apply topically daily., Disp: , Rfl:  ?  metFORMIN (GLUCOPHAGE) 500 MG tablet, Take 1 tablet (500 mg total) by mouth 2 (two) times daily with a meal., Disp: 180 tablet, Rfl: 3 ?  methocarbamol (ROBAXIN) 500 MG tablet, Take 1 tablet (500 mg total) by mouth every 6 (six) hours as needed for muscle spasms., Disp: 40 tablet, Rfl: 0 ?  metoprolol succinate (TOPROL-XL) 100 MG 24 hr tablet, TAKE 1 TABLET BY MOUTH EVERY DAY EVERY EVENING WITH  OR IMMEDIATELY FOLLOWING A MEAL, Disp: 90 tablet, Rfl: 0 ?  montelukast (SINGULAIR) 10 MG tablet, TAKE 1 TABLET BY MOUTH EVERYDAY AT BEDTIME, Disp: 90 tablet, Rfl: 1 ?  ondansetron (ZOFRAN-ODT) 4 MG disintegrating tablet, TAKE 1 TABLET BY MOUTH EVERY 8 HOURS AS NEEDED *INS MAX 18 TABS PER 21 DAYS*, Disp: 18 tablet, Rfl: 3 ?  pantoprazole (PROTONIX) 40 MG tablet, TAKE 1 TABLET BY MOUTH TWICE A DAY, Disp: 180 tablet, Rfl: 1 ?  PROAIR HFA 108 (90 Base) MCG/ACT inhaler, INHALE 2 PUFFS INTO THE LUNGS EVERY 6 (SIX) HOURS AS NEEDED. FOR SHORTNESS OF BREATH., Disp: 6.7  Inhaler, Rfl: 4 ?  rosuvastatin (CRESTOR) 10 MG tablet, TAKE 1 TABLET BY MOUTH EVERY DAY, Disp: 90 tablet, Rfl: 0 ?  scopolamine (TRANSDERM-SCOP) 1 MG/3DAYS, Place 1 patch (1.5 mg total) onto the skin every 3 (three) days., Disp: 10 patch, Rfl: 12 ?  valACYclovir (VALTREX) 1000 MG tablet, Take 1,000 mg by mouth 2 (two) times daily., Disp: , Rfl:  ?  valsartan (DIOVAN) 320 MG tablet, TAKE 1 TABLET BY MOUTH EVERY DAY, Disp: 90 tablet, Rfl: 0 ?  venlafaxine XR (EFFEXOR-XR) 150 MG 24 hr capsule, TAKE 1 CAPSULE BY MOUTH EVERY DAY IN THE MORNING, Disp: 90 capsule, Rfl: 1 ?  clonazePAM (KLONOPIN) 0.5 MG tablet, Take 1 tablet (0.5 mg total) by mouth 2 (two) times daily., Disp: 60 tablet, Rfl: 2  ? ?Objective:   ?  ?Vitals:  ? 05/09/21 0947  ?BP: 136/80  ?Pulse: 75  ?SpO2: 97%  ?Weight: 201 lb (91.2 kg)  ?Height: '4\' 11"'$  (1.499 m)  ?  ?  ?Body mass index is 40.6 kg/m?.  ?  ?Physical Exam:   ? ?General:  awake, alert oriented, no acute distress nontoxic ?Skin: no suspicious lesions or rashes ?Neuro:sensation intact, no deficits, strength 5/5 with no deficits, no atrophy, normal muscle tone ?Psych: No signs of anxiety, depression or other mood disorder ? ?Right knee: ?Positive for crepitus ?No swelling ?No deformity ?Neg fluid wave, joint milking ?ROM Flex 100, Ext 10 ?TTP medial lateral joint line ?NTTP over the quad tendon, medial fem condyle, lat fem condyle, patella, plica, patella tendon, tibial tuberostiy, fibular head, posterior fossa, pes anserine bursa, gerdy's tubercle,   ?Neg anterior and posterior drawer ?Neg lachman ?Neg sag sign ?Negative varus stress ?Negative valgus stress ?Negative McMurray ?  ? ?Gait normal  ? ? ?Electronically signed by:  ?Leah [name] Leah [name] ?[facility] ?10:08 AM 05/09/21 ?

## 2021-05-09 ENCOUNTER — Ambulatory Visit (INDEPENDENT_AMBULATORY_CARE_PROVIDER_SITE_OTHER): Payer: BC Managed Care – PPO | Admitting: Sports Medicine

## 2021-05-09 VITALS — BP 136/80 | HR 75 | Ht 59.0 in | Wt 201.0 lb

## 2021-05-09 DIAGNOSIS — C7B8 Other secondary neuroendocrine tumors: Secondary | ICD-10-CM

## 2021-05-09 DIAGNOSIS — M1711 Unilateral primary osteoarthritis, right knee: Secondary | ICD-10-CM | POA: Diagnosis not present

## 2021-05-09 DIAGNOSIS — G8929 Other chronic pain: Secondary | ICD-10-CM

## 2021-05-09 DIAGNOSIS — M25551 Pain in right hip: Secondary | ICD-10-CM

## 2021-05-09 DIAGNOSIS — M25561 Pain in right knee: Secondary | ICD-10-CM

## 2021-05-09 NOTE — Progress Notes (Signed)
Chief Complaint:   OBESITY Leah [name] is here to discuss her progress with her obesity treatment plan along with follow-up of her obesity related diagnoses.   Today's visit was #: 23 Starting weight: 226 lbs Starting date: 07/07/2019 Today's weight: 201 lbs Today's date: 05/07/2021 Weight change since last visit: +3 lbs Total lbs lost to date: 25 lbs Body mass index is 40.6 kg/m.  Total weight loss percentage to date: -11.06%  Current Meal Plan: the Category 2 Plan for 80% of the time.  Current Exercise Plan: None at this time  Interim History: [name] reports that she was unable to tolerate GLP1RA. She has increased her muscle since her last visit.  Assessment/Plan:   1. Type 2 diabetes mellitus with other specified complication, without long-term current use of insulin (HCC) Diabetes Mellitus: Not at goal. Medication: Metformin 500 mg daily. Issues reviewed: blood sugar goals, complications of diabetes mellitus, hypoglycemia prevention and treatment, exercise, and nutrition.   Plan: Increase Metformin to 100 mg twice daily. The patient will continue to focus on protein-rich, low simple carbohydrate foods. We reviewed the importance of hydration, regular exercise for stress reduction, and restorative sleep.   Lab Results  Component Value Date   HGBA1C 6.2 (A) 04/23/2021   HGBA1C 5.8 (H) 10/17/2020   HGBA1C 6.4 (H) 03/05/2020   Lab Results  Component Value Date   MICROALBUR 0.9 07/06/2019   LDLCALC 63 11/02/2019   CREATININE 0.74 02/19/2021   2. Hypertension associated with type 2 diabetes mellitus ([place]) Not at goal. Medications: Norvasc 10 mg daily, metoprolol 100 mg daily, valsartan 320 mg daily.   Plan: Avoid buying foods that are: processed, frozen, or prepackaged to avoid excess salt. We will watch for signs of hypotension as she continues lifestyle modifications.  BP Readings from Last 3 Encounters:  05/09/21 136/80  05/07/21 139/82  04/23/21 133/79   Lab  Results  Component Value Date   CREATININE 0.74 02/19/2021   3. Hyperlipidemia associated with type 2 diabetes mellitus ([place]) Course: At goal. Lipid-lowering medications: Crestor 10 mg daily.   Plan: Dietary changes: Increase soluble fiber, decrease simple carbohydrates, decrease saturated fat. Exercise changes: Moderate to vigorous-intensity aerobic activity 150 minutes per week or as tolerated. We will continue to monitor along with PCP/specialists as it pertains to her weight loss journey.  Lab Results  Component Value Date   CHOL 140 11/02/2019   HDL 58 11/02/2019   LDLCALC 63 11/02/2019   TRIG 111 11/02/2019   CHOLHDL 2.4 11/02/2019   Lab Results  Component Value Date   ALT 16 02/19/2021   AST 20 02/19/2021   ALKPHOS 108 02/19/2021   BILITOT 0.5 02/19/2021   The 10-year ASCVD risk score (Arnett DK, et al., 2019) is: 17.1%   Values used to calculate the score:     Age: 61 years     Sex: Female     Is Non-Hispanic African American: No     Diabetic: Yes     Tobacco smoker: Yes     Systolic Blood Pressure: 970 mmHg     Is BP treated: Yes     HDL Cholesterol: 58 mg/dL     Total Cholesterol: 140 mg/dL  4. Obesity BMI today is 40.7 Course: [name] is currently in the action stage of change. As such, her goal is to continue with weight loss efforts.   Nutrition goals: She has agreed to the Category 2 Plan.   Exercise goals: As is.  Behavioral modification strategies: increasing  lean protein intake, decreasing simple carbohydrates, increasing vegetables, and increasing water intake.  [name] has agreed to follow-up with our clinic in 4 weeks. She was informed of the importance of frequent follow-up visits to maximize her success with intensive lifestyle modifications for her multiple health conditions.   Objective:   Blood pressure 139/82, pulse 93, temperature 97.8 F (36.6 C), temperature source Oral, height '4\' 11"'$  (1.499 m), weight 201 lb (91.2 kg), SpO2 97 %. Body  mass index is 40.6 kg/m.  General: Cooperative, alert, well developed, in no acute distress. HEENT: Conjunctivae and lids unremarkable. Cardiovascular: Regular rhythm.  Lungs: Normal work of breathing. Neurologic: No focal deficits.   Lab Results  Component Value Date   CREATININE 0.74 02/19/2021   BUN 11 02/19/2021   NA 137 02/19/2021   K 3.7 02/19/2021   CL 100 02/19/2021   CO2 25 02/19/2021   Lab Results  Component Value Date   ALT 16 02/19/2021   AST 20 02/19/2021   ALKPHOS 108 02/19/2021   BILITOT 0.5 02/19/2021   Lab Results  Component Value Date   HGBA1C 6.2 (A) 04/23/2021   HGBA1C 5.8 (H) 10/17/2020   HGBA1C 6.4 (H) 03/05/2020   HGBA1C 6.8 (H) 11/02/2019   HGBA1C 7.3 (H) 07/06/2019   Lab Results  Component Value Date   TSH 2.720 03/05/2020   Lab Results  Component Value Date   CHOL 140 11/02/2019   HDL 58 11/02/2019   LDLCALC 63 11/02/2019   TRIG 111 11/02/2019   CHOLHDL 2.4 11/02/2019   Lab Results  Component Value Date   VD25OH 50 07/22/2012   VD25OH 38 08/20/2010   Lab Results  Component Value Date   WBC 10.3 02/19/2021   HGB 12.3 02/19/2021   HCT 38.9 02/19/2021   MCV 88.0 02/19/2021   PLT 276 02/19/2021   Lab Results  Component Value Date   IRON <10 (L) 10/18/2008   TIBC NOT CALC Not calculated due to Iron <10. 10/18/2008   FERRITIN 382 (H) 10/18/2008   Attestation Statements:   Reviewed by clinician on day of visit: allergies, medications, problem list, medical history, surgical history, family history, social history, and previous encounter notes.  Leodis Binet Friedenbach, CMA, am acting as Location manager for [employer], DO.  I have reviewed the above documentation for accuracy and completeness, and I agree with the above. -  Briscoe Deutscher, DO, MS, FAAFP, DABOM - Family and Bariatric Medicine.

## 2021-05-09 NOTE — Patient Instructions (Addendum)
Good to see you  ?06/12/2021 or later for a follow up for repeat knee injection  ?

## 2021-05-17 DIAGNOSIS — B353 Tinea pedis: Secondary | ICD-10-CM | POA: Diagnosis not present

## 2021-05-20 ENCOUNTER — Encounter: Payer: Self-pay | Admitting: Hematology & Oncology

## 2021-05-20 ENCOUNTER — Inpatient Hospital Stay: Payer: BC Managed Care – PPO

## 2021-05-20 ENCOUNTER — Inpatient Hospital Stay (HOSPITAL_BASED_OUTPATIENT_CLINIC_OR_DEPARTMENT_OTHER): Payer: BC Managed Care – PPO | Admitting: Hematology & Oncology

## 2021-05-20 ENCOUNTER — Other Ambulatory Visit: Payer: Self-pay

## 2021-05-20 ENCOUNTER — Inpatient Hospital Stay: Payer: BC Managed Care – PPO | Attending: Hematology & Oncology

## 2021-05-20 VITALS — BP 141/67 | HR 74 | Temp 99.1°F | Resp 18 | Wt 205.0 lb

## 2021-05-20 DIAGNOSIS — R978 Other abnormal tumor markers: Secondary | ICD-10-CM | POA: Diagnosis not present

## 2021-05-20 DIAGNOSIS — C7B8 Other secondary neuroendocrine tumors: Secondary | ICD-10-CM

## 2021-05-20 DIAGNOSIS — C7B02 Secondary carcinoid tumors of liver: Secondary | ICD-10-CM | POA: Diagnosis not present

## 2021-05-20 DIAGNOSIS — C7A00 Malignant carcinoid tumor of unspecified site: Secondary | ICD-10-CM | POA: Insufficient documentation

## 2021-05-20 LAB — CMP (CANCER CENTER ONLY)
ALT: 16 U/L (ref 0–44)
AST: 17 U/L (ref 15–41)
Albumin: 4.1 g/dL (ref 3.5–5.0)
Alkaline Phosphatase: 88 U/L (ref 38–126)
Anion gap: 8 (ref 5–15)
BUN: 13 mg/dL (ref 8–23)
CO2: 25 mmol/L (ref 22–32)
Calcium: 9.5 mg/dL (ref 8.9–10.3)
Chloride: 104 mmol/L (ref 98–111)
Creatinine: 0.69 mg/dL (ref 0.44–1.00)
GFR, Estimated: >60 mL/min (ref >60)
Glucose, Bld: 193 mg/dL — ABNORMAL HIGH (ref 70–99)
Potassium: 3.7 mmol/L (ref 3.5–5.1)
Sodium: 137 mmol/L (ref 135–145)
Total Bilirubin: 0.4 mg/dL (ref 0.3–1.2)
Total Protein: 7.4 g/dL (ref 6.5–8.1)

## 2021-05-20 LAB — CBC WITH DIFFERENTIAL (CANCER CENTER ONLY)
Abs Immature Granulocytes: 0.03 10*3/uL (ref 0.00–0.07)
Basophils Absolute: 0.1 10*3/uL (ref 0.0–0.1)
Basophils Relative: 1 %
Eosinophils Absolute: 0.2 10*3/uL (ref 0.0–0.5)
Eosinophils Relative: 2 %
HCT: 37.6 % (ref 36.0–46.0)
Hemoglobin: 12.1 g/dL (ref 12.0–15.0)
Immature Granulocytes: 0 %
Lymphocytes Relative: 30 %
Lymphs Abs: 3 10*3/uL (ref 0.7–4.0)
MCH: 27.8 pg (ref 26.0–34.0)
MCHC: 32.2 g/dL (ref 30.0–36.0)
MCV: 86.4 fL (ref 80.0–100.0)
Monocytes Absolute: 0.6 10*3/uL (ref 0.1–1.0)
Monocytes Relative: 6 %
Neutro Abs: 6.2 10*3/uL (ref 1.7–7.7)
Neutrophils Relative %: 61 %
Platelet Count: 267 10*3/uL (ref 150–400)
RBC: 4.35 MIL/uL (ref 3.87–5.11)
RDW: 14.5 % (ref 11.5–15.5)
WBC Count: 10.1 10*3/uL (ref 4.0–10.5)
nRBC: 0 % (ref 0.0–0.2)

## 2021-05-20 LAB — LACTATE DEHYDROGENASE: LDH: 163 U/L (ref 98–192)

## 2021-05-20 MED ORDER — LANREOTIDE ACETATE 120 MG/0.5ML ~~LOC~~ SOLN
120.0000 mg | Freq: Once | SUBCUTANEOUS | Status: AC
  Administered 2021-05-20: 120 mg via SUBCUTANEOUS
  Filled 2021-05-20: qty 120

## 2021-05-20 NOTE — Patient Instructions (Signed)
Lanreotide injection °What is this medication? °LANREOTIDE (lan REE oh tide) is used to reduce blood levels of growth hormone in patients with a condition called acromegaly. It also works to slow or stop tumor growth in patients with neuroendocrine tumors and treat carcinoid syndrome. °This medicine may be used for other purposes; ask your health care provider or pharmacist if you have questions. °COMMON BRAND NAME(S): Somatuline [place] °What should I tell my care team before I take this medication? °They need to know if you have any of these conditions: °diabetes °gallbladder disease °heart disease °kidney disease °liver disease °thyroid disease °an unusual or allergic reaction to lanreotide, other medicines, foods, dyes, or preservatives °pregnant or trying to get pregnant °breast-feeding °How should I use this medication? °This medicine is for injection under the skin. It is given by a health care professional in a hospital or clinic setting. °Contact your pediatrician or health care professional regarding the use of this medicine in children. Special care may be needed. °Overdosage: If you think you have taken too much of this medicine contact a poison control center or emergency room at once. °NOTE: This medicine is only for you. Do not share this medicine with others. °What if I miss a dose? °It is important not to miss your dose. Call your doctor or health care professional if you are unable to keep an appointment. °What may interact with this medication? °This medicine may interact with the following medications: °bromocriptine °cyclosporine °certain medicines for blood pressure, heart disease, irregular heart beat °certain medicines for diabetes °quinidine °terfenadine °This list may not describe all possible interactions. Give your health care provider a list of all the medicines, herbs, non-prescription drugs, or dietary supplements you use. Also tell them if you smoke, drink alcohol, or use illegal drugs.  Some items may interact with your medicine. °What should I watch for while using this medication? °Tell your doctor or healthcare professional if your symptoms do not start to get better or if they get worse. °Visit your doctor or health care professional for regular checks on your progress. Your condition will be monitored carefully while you are receiving this medicine. °This medicine may increase blood sugar. Ask your healthcare provider if changes in diet or medicines are needed if you have diabetes. °You may need blood work done while you are taking this medicine. °Women should inform their doctor if they wish to become pregnant or think they might be pregnant. There is a potential for serious side effects to an unborn child. Talk to your health care professional or pharmacist for more information. Do not breast-feed an infant while taking this medicine or for 6 months after stopping it. °This medicine has caused ovarian failure in some women. This medicine may interfere with the ability to have a child. Talk with your doctor or health care professional if you are concerned about your fertility. °What side effects may I notice from receiving this medication? °Side effects that you should report to your doctor or health care professional as soon as possible: °allergic reactions like skin rash, itching or hives, swelling of the face, lips, or tongue °increased blood pressure °severe stomach pain °signs and symptoms of hgh blood sugar such as being more thirsty or hungry or having to urinate more than normal. You may also feel very tired or have blurry vision. °signs and symptoms of low blood sugar such as feeling anxious; confusion; dizziness; increased hunger; unusually weak or tired; sweating; shakiness; cold; irritable; headache; blurred vision; fast   heartbeat; loss of consciousness °unusually slow heartbeat °Side effects that usually do not require medical attention (report to your doctor or health care  professional if they continue or are bothersome): °constipation °diarrhea °dizziness °headache °muscle pain °muscle spasms °nausea °pain, redness, or irritation at site where injected °This list may not describe all possible side effects. Call your doctor for medical advice about side effects. You may report side effects to FDA at 1-800-FDA-1088. °Where should I keep my medication? °This drug is given in a hospital or clinic and will not be stored at home. °NOTE: This sheet is a summary. It may not cover all possible information. If you have questions about this medicine, talk to your doctor, pharmacist, or health care provider. °© 2022 [employer] (2017-12-16 00:00:00) ° °

## 2021-05-20 NOTE — Progress Notes (Signed)
?Hematology and Oncology Follow Up Visit ? ?Leah [name] ?[id number] ?November 22, 1960 61 y.o. ?05/20/2021 ? ? ?Principle Diagnosis:  ?Metastatic neuroendocrine carcinoma - carcinoid ?Recurrent neuroendocrine liver metastasis ? ?Current Therapy:   ?Somatuline 120 mg SQ monthly  ?Open liver ablation  ?  ?Interim History:  Leah [name] is here today for follow-up and Somatuline injection.  Everything seems to be going pretty well with her.  She had the surgery for her right knee a while back.  Now her left knee is causing her problems. ? ?Her last Chromogranin A level was 77.  As such, everything seems to be doing pretty well with the neuroendocrine tumor. ? ?I did give her a 24-hour urine specimen to do today. ? ?She is not tachycardic.  I am not sure why she had the tachycardia the last time that we had seen her. ? ?She has had no problems with cough or shortness of breath.  She has had no rashes.  There has been no diarrhea.  She has had no leg swelling.  There is been no rashes. ? ?Every now that she does have little bit of a headache. ? ?She and her family do have a very nice Easter weekend. ? ?Overall, her performance status is ECOG 1.   ? ?Medications:  ?Allergies as of 05/20/2021   ?No Known Allergies ?  ? ?  ?Medication List  ?  ? ?  ? Accurate as of May 20, 2021 10:43 AM. If you have any questions, ask your nurse or doctor.  ?  ?  ? ?  ? ?amLODipine 10 MG tablet ?Commonly known as: NORVASC ?TAKE 1 TABLET BY MOUTH EVERY DAY ?  ?aspirin 81 MG EC tablet ?TAKE 1 TABLET BY MOUTH EVERY DAY *SWALLOW WHOLE* ?  ?ciclopirox 8 % solution ?Commonly known as: PENLAC ?Apply topically daily. ?  ?clonazePAM 0.5 MG tablet ?Commonly known as: KLONOPIN ?Take 1 tablet (0.5 mg total) by mouth 2 (two) times daily. ?  ?fexofenadine 180 MG tablet ?Commonly known as: ALLEGRA ?Take 1 tablet (180 mg total) by mouth daily. ?  ?gabapentin 100 MG capsule ?Commonly known as: NEURONTIN ?TAKE 1 CAPSULE BY MOUTH EVERYDAY AT BEDTIME ?  ?ketoconazole  2 % cream ?Commonly known as: NIZORAL ?Apply topically daily. ?  ?metFORMIN 500 MG tablet ?Commonly known as: GLUCOPHAGE ?Take 1 tablet (500 mg total) by mouth 2 (two) times daily with a meal. ?  ?methocarbamol 500 MG tablet ?Commonly known as: ROBAXIN ?Take 1 tablet (500 mg total) by mouth every 6 (six) hours as needed for muscle spasms. ?  ?metoprolol succinate 100 MG 24 hr tablet ?Commonly known as: TOPROL-XL ?TAKE 1 TABLET BY MOUTH EVERY DAY EVERY EVENING WITH OR IMMEDIATELY FOLLOWING A MEAL ?  ?montelukast 10 MG tablet ?Commonly known as: SINGULAIR ?TAKE 1 TABLET BY MOUTH EVERYDAY AT BEDTIME ?  ?ondansetron 4 MG disintegrating tablet ?Commonly known as: ZOFRAN-ODT ?TAKE 1 TABLET BY MOUTH EVERY 8 HOURS AS NEEDED *INS MAX 18 TABS PER 21 DAYS* ?  ?pantoprazole 40 MG tablet ?Commonly known as: PROTONIX ?TAKE 1 TABLET BY MOUTH TWICE A DAY ?  ?ProAir HFA 108 (90 Base) MCG/ACT inhaler ?Generic drug: albuterol ?INHALE 2 PUFFS INTO THE LUNGS EVERY 6 (SIX) HOURS AS NEEDED. FOR SHORTNESS OF BREATH. ?  ?rosuvastatin 10 MG tablet ?Commonly known as: CRESTOR ?TAKE 1 TABLET BY MOUTH EVERY DAY ?  ?scopolamine 1 MG/3DAYS ?Commonly known as: TRANSDERM-SCOP ?Place 1 patch (1.5 mg total) onto the skin every 3 (three) days. ?  ?  valACYclovir 1000 MG tablet ?Commonly known as: VALTREX ?Take 1,000 mg by mouth 2 (two) times daily. ?  ?valsartan 320 MG tablet ?Commonly known as: DIOVAN ?TAKE 1 TABLET BY MOUTH EVERY DAY ?  ?venlafaxine XR 150 MG 24 hr capsule ?Commonly known as: EFFEXOR-XR ?TAKE 1 CAPSULE BY MOUTH EVERY DAY IN THE MORNING ?  ? ?  ? ? ?Allergies: No Known Allergies ? ?Past Medical History, Surgical history, Social history, and Family History were reviewed and updated. ? ?Review of Systems: ?Review of Systems  ?Constitutional: Negative.   ?HENT: Negative.    ?Eyes: Negative.   ?Respiratory: Negative.    ?Cardiovascular: Negative.   ?Gastrointestinal: Negative.   ?Genitourinary: Negative.   ?Musculoskeletal: Negative.    ?Skin: Negative.   ?Neurological: Negative.   ?Endo/Heme/Allergies: Negative.   ?Psychiatric/Behavioral: Negative.    ? ? ?Physical Exam: ? weight is 205 lb (93 kg). Her oral temperature is 99.1 ?F (37.3 ?C). Her blood pressure is 141/67 (abnormal) and her pulse is 74. Her respiration is 18 and oxygen saturation is 100%.  ? ?Wt Readings from Last 3 Encounters:  ?05/20/21 205 lb (93 kg)  ?05/09/21 201 lb (91.2 kg)  ?05/07/21 201 lb (91.2 kg)  ? ? ?Physical Exam ?Vitals reviewed.  ?HENT:  ?   Head: Normocephalic and atraumatic.  ?Eyes:  ?   Pupils: Pupils are equal, round, and reactive to light.  ?Cardiovascular:  ?   Rate and Rhythm: Normal rate and regular rhythm.  ?   Heart sounds: Normal heart sounds.  ?Pulmonary:  ?   Effort: Pulmonary effort is normal.  ?   Breath sounds: Normal breath sounds.  ?Abdominal:  ?   General: Bowel sounds are normal.  ?   Palpations: Abdomen is soft.  ?   Comments: Abdominal exam shows an obese abdomen that is soft.  She has good bowel sounds.  There is no fluid wave.  She has laparotomy scars that are well-healed.  There is no tenderness.  There is no guarding or rebound.  There is no palpable liver or spleen tip.  ?Musculoskeletal:     ?   General: No tenderness or deformity.  ?   Cervical back: Normal range of motion.  ?   Comments: She has a knee brace on the left knee.  There may be a little bit of swelling in the left leg.  She does have limited range of motion of the left knee but again, much better than I would have thought.  ?Lymphadenopathy:  ?   Cervical: No cervical adenopathy.  ?Skin: ?   General: Skin is warm and dry.  ?   Findings: No erythema or rash.  ?Neurological:  ?   Mental Status: She is alert and oriented to person, place, and time.  ?Psychiatric:     ?   Behavior: Behavior normal.     ?   Thought Content: Thought content normal.     ?   Judgment: Judgment normal.  ? ? ? ?Lab Results  ?Component Value Date  ? WBC 10.1 05/20/2021  ? HGB 12.1 05/20/2021  ? HCT  37.6 05/20/2021  ? MCV 86.4 05/20/2021  ? PLT 267 05/20/2021  ? ?Lab Results  ?Component Value Date  ? FERRITIN 382 (H) 10/18/2008  ? IRON <10 (L) 10/18/2008  ? TIBC NOT CALC Not calculated due to Iron <10. 10/18/2008  ? UIBC 156 10/18/2008  ? IRONPCTSAT NOT CALC Not calculated due to Iron <10. 10/18/2008  ? ?Lab Results  ?  Component Value Date  ? RETICCTPCT 0.9 10/18/2008  ? RBC 4.35 05/20/2021  ? ?No results found for: KPAFRELGTCHN, LAMBDASER, KAPLAMBRATIO ?No results found for: IGGSERUM, IGA, IGMSERUM ?No results found for: TOTALPROTELP, ALBUMINELP, A1GS, A2GS, BETS, BETA2SER, GAMS, MSPIKE, SPEI ?  Chemistry   ?   ?Component Value Date/Time  ? NA 137 02/19/2021 0956  ? NA 140 01/20/2017 1416  ? NA 141 01/17/2016 1047  ? K 3.7 02/19/2021 0956  ? K 3.9 01/20/2017 1416  ? K 3.2 (L) 01/17/2016 1047  ? CL 100 02/19/2021 0956  ? CL 102 01/20/2017 1416  ? CO2 25 02/19/2021 0956  ? CO2 26 01/20/2017 1416  ? CO2 27 01/17/2016 1047  ? BUN 11 02/19/2021 0956  ? BUN 15 01/20/2017 1416  ? BUN 6.6 (L) 01/17/2016 1047  ? CREATININE 0.74 02/19/2021 0956  ? CREATININE 1.1 01/20/2017 1416  ? CREATININE 0.7 01/17/2016 1047  ?    ?Component Value Date/Time  ? CALCIUM 9.9 02/19/2021 0956  ? CALCIUM 9.1 01/20/2017 1416  ? CALCIUM 8.7 01/17/2016 1047  ? ALKPHOS 108 02/19/2021 0956  ? ALKPHOS 84 01/20/2017 1416  ? ALKPHOS 90 01/17/2016 1047  ? AST 20 02/19/2021 0956  ? AST 34 01/17/2016 1047  ? ALT 16 02/19/2021 0956  ? ALT 35 01/20/2017 1416  ? ALT 38 01/17/2016 1047  ? BILITOT 0.5 02/19/2021 0956  ? BILITOT 0.42 01/17/2016 1047  ?  ? ? ? ?Impression and Plan: Ms. Graw is a very pleasant 61 yo caucasian female with long standing history of metastatic carcinoid with recurrent liver metastasis.  ? ?So far, everything seems to be holding pretty steady.  I am very happy about this. ? ?I forgot to mention that she did lose her family practice doctor.  She left the practice.  I told her that if she sees a PA that would be fine. ? ?I do not  see that we have to do any scans on her.  Again I think as long as the Chromogranin A level is doing well, I am unsure of the scan will help [place]. ? ?We will plan to get her back in another month.  She will ha

## 2021-05-21 ENCOUNTER — Encounter: Payer: Self-pay | Admitting: Family

## 2021-05-21 ENCOUNTER — Other Ambulatory Visit: Payer: Self-pay | Admitting: Family Medicine

## 2021-05-21 DIAGNOSIS — M4722 Other spondylosis with radiculopathy, cervical region: Secondary | ICD-10-CM

## 2021-05-22 LAB — CHROMOGRANIN A: Chromogranin A (ng/mL): 70 ng/mL (ref 0.0–101.8)

## 2021-05-22 MED ORDER — GABAPENTIN 300 MG PO CAPS
300.0000 mg | ORAL_CAPSULE | Freq: Every day | ORAL | 0 refills | Status: DC
Start: 2021-05-22 — End: 2021-10-04

## 2021-05-22 NOTE — Telephone Encounter (Signed)
wanted to try increasing the dosage, or taking another capsule in the morning. My hands hurt, and go numb with my arthritis  ?

## 2021-05-27 ENCOUNTER — Other Ambulatory Visit: Payer: Self-pay | Admitting: Family Medicine

## 2021-05-27 ENCOUNTER — Other Ambulatory Visit: Payer: Self-pay | Admitting: Physician Assistant

## 2021-05-27 DIAGNOSIS — M4722 Other spondylosis with radiculopathy, cervical region: Secondary | ICD-10-CM

## 2021-06-07 DIAGNOSIS — B353 Tinea pedis: Secondary | ICD-10-CM | POA: Diagnosis not present

## 2021-06-10 DIAGNOSIS — G4733 Obstructive sleep apnea (adult) (pediatric): Secondary | ICD-10-CM | POA: Diagnosis not present

## 2021-06-19 ENCOUNTER — Inpatient Hospital Stay: Payer: BC Managed Care – PPO | Attending: Hematology & Oncology

## 2021-06-19 VITALS — BP 159/75 | HR 82 | Temp 98.9°F | Resp 18

## 2021-06-19 DIAGNOSIS — C7B8 Other secondary neuroendocrine tumors: Secondary | ICD-10-CM

## 2021-06-19 DIAGNOSIS — C7A00 Malignant carcinoid tumor of unspecified site: Secondary | ICD-10-CM | POA: Insufficient documentation

## 2021-06-19 DIAGNOSIS — C7B02 Secondary carcinoid tumors of liver: Secondary | ICD-10-CM | POA: Diagnosis not present

## 2021-06-19 MED ORDER — LANREOTIDE ACETATE 120 MG/0.5ML ~~LOC~~ SOLN
120.0000 mg | Freq: Once | SUBCUTANEOUS | Status: AC
  Administered 2021-06-19: 120 mg via SUBCUTANEOUS
  Filled 2021-06-19: qty 120

## 2021-06-19 NOTE — Patient Instructions (Signed)
\[id number]\\[phone]\Lanreotide injection ?What is this medication? ?LANREOTIDE (lan REE oh tide) is used to reduce blood levels of growth hormone in patients with a condition called acromegaly. It also works to slow or stop tumor growth in patients with neuroendocrine tumors and treat carcinoid syndrome. ?This medicine may be used for other purposes; ask your health care provider or pharmacist if you have questions. ?COMMON BRAND NAME(S): Somatuline Depot ?What should I tell my care team before I take this medication? ?They need to know if you have any of these conditions: ?diabetes ?gallbladder disease ?heart disease ?kidney disease ?liver disease ?thyroid disease ?an unusual or allergic reaction to lanreotide, other medicines, foods, dyes, or preservatives ?pregnant or trying to get pregnant ?breast-feeding ?How should I use this medication? ?This medicine is for injection under the skin. It is given by a health care professional in a hospital or clinic setting. ?Contact your pediatrician or health care professional regarding the use of this medicine in children. Special care may be needed. ?Overdosage: If you think you have taken too much of this medicine contact a poison control center or emergency room at once. ?NOTE: This medicine is only for you. Do not share this medicine with others. ?What if I miss a dose? ?It is important not to miss your dose. Call your doctor or health care professional if you are unable to keep an appointment. ?What may interact with this medication? ?This medicine may interact with the following medications: ?bromocriptine ?cyclosporine ?certain medicines for blood pressure, heart disease, irregular heart beat ?certain medicines for diabetes ?quinidine ?terfenadine ?This list may not describe all possible interactions. Give your health care provider a list of all the medicines, herbs, non-prescription drugs, or dietary supplements you use. Also tell them if you smoke, drink  alcohol, or use illegal drugs. Some items may interact with your medicine. ?What should I watch for while using this medication? ?Tell your doctor or healthcare professional if your symptoms do not start to get better or if they get worse. ?Visit your doctor or health care professional for regular checks on your progress. Your condition will be monitored carefully while you are receiving this medicine. ?This medicine may increase blood sugar. Ask your healthcare provider if changes in diet or medicines are needed if you have diabetes. ?You may need blood work done while you are taking this medicine. ?Women should inform their doctor if they wish to become pregnant or think they might be pregnant. There is a potential for serious side effects to an unborn child. Talk to your health care professional or pharmacist for more information. Do not breast-feed an infant while taking this medicine or for 6 months after stopping it. ?This medicine has caused ovarian failure in some women. This medicine may interfere with the ability to have a child. Talk with your doctor or health care professional if you are concerned about your fertility. ?What side effects may I notice from receiving this medication? ?Side effects that you should report to your doctor or health care professional as soon as possible: ?allergic reactions like skin rash, itching or hives, swelling of the face, lips, or tongue ?increased blood pressure ?severe stomach pain ?signs and symptoms of hgh blood sugar such as being more thirsty or hungry or having to urinate more than normal. You may also feel very tired or have blurry vision. ?signs and symptoms of low blood sugar such as feeling anxious; confusion; dizziness; increased hunger; unusually weak or tired; sweating; shakiness; cold; irritable; headache; blurred vision; fast  heartbeat; loss of consciousness ?unusually slow heartbeat ?Side effects that usually do not require medical attention (report to  your doctor or health care professional if they continue or are bothersome): ?constipation ?diarrhea ?dizziness ?headache ?muscle pain ?muscle spasms ?nausea ?pain, redness, or irritation at site where injected ?This list may not describe all possible side effects. Call your doctor for medical advice about side effects. You may report side effects to FDA at 1-800-FDA-1088. ?Where should I keep my medication? ?This drug is given in a hospital or clinic and will not be stored at home. ?NOTE: This sheet is a summary. It may not cover all possible information. If you have questions about this medicine, talk to your doctor, pharmacist, or health care provider. ?? 2023 Elsevier/Gold Standard (2017-12-16 00:00:00) ? ?

## 2021-06-21 ENCOUNTER — Other Ambulatory Visit: Payer: Self-pay

## 2021-06-21 ENCOUNTER — Other Ambulatory Visit: Payer: Self-pay | Admitting: Physician Assistant

## 2021-06-21 DIAGNOSIS — M4722 Other spondylosis with radiculopathy, cervical region: Secondary | ICD-10-CM

## 2021-06-23 ENCOUNTER — Other Ambulatory Visit: Payer: Self-pay | Admitting: Physician Assistant

## 2021-06-25 ENCOUNTER — Other Ambulatory Visit: Payer: Self-pay

## 2021-06-25 DIAGNOSIS — K219 Gastro-esophageal reflux disease without esophagitis: Secondary | ICD-10-CM

## 2021-06-25 MED ORDER — LANSOPRAZOLE 30 MG PO CPDR
30.0000 mg | DELAYED_RELEASE_CAPSULE | Freq: Every day | ORAL | 0 refills | Status: DC
Start: 2021-06-25 — End: 2021-07-17

## 2021-06-26 ENCOUNTER — Ambulatory Visit (INDEPENDENT_AMBULATORY_CARE_PROVIDER_SITE_OTHER): Payer: BC Managed Care – PPO | Admitting: Sports Medicine

## 2021-06-26 ENCOUNTER — Ambulatory Visit (INDEPENDENT_AMBULATORY_CARE_PROVIDER_SITE_OTHER): Payer: BC Managed Care – PPO

## 2021-06-26 VITALS — BP 126/76 | HR 78 | Ht 59.0 in | Wt 207.0 lb

## 2021-06-26 DIAGNOSIS — M79641 Pain in right hand: Secondary | ICD-10-CM

## 2021-06-26 DIAGNOSIS — M79645 Pain in left finger(s): Secondary | ICD-10-CM | POA: Diagnosis not present

## 2021-06-26 DIAGNOSIS — M25561 Pain in right knee: Secondary | ICD-10-CM | POA: Diagnosis not present

## 2021-06-26 DIAGNOSIS — M65311 Trigger thumb, right thumb: Secondary | ICD-10-CM

## 2021-06-26 DIAGNOSIS — M1711 Unilateral primary osteoarthritis, right knee: Secondary | ICD-10-CM | POA: Diagnosis not present

## 2021-06-26 DIAGNOSIS — M79642 Pain in left hand: Secondary | ICD-10-CM

## 2021-06-26 DIAGNOSIS — M65312 Trigger thumb, left thumb: Secondary | ICD-10-CM | POA: Diagnosis not present

## 2021-06-26 DIAGNOSIS — G8929 Other chronic pain: Secondary | ICD-10-CM

## 2021-06-26 DIAGNOSIS — M79644 Pain in right finger(s): Secondary | ICD-10-CM

## 2021-06-26 NOTE — Progress Notes (Signed)
? ? Leah [name] D.Merril Abbe ?[facility] ?[place] ?Phone: [phone] ?  ?Assessment and Plan:   ?  ?1. Osteoarthritis of right knee, unspecified osteoarthritis type ?2. Chronic pain of right knee ?-Chronic with exacerbation, subsequent visit ?- Consistent with flare of chronic osteoarthritis based on HPI, physical exam, previous imaging ?- Last intra-articular CSI was 04/05/2021 which provided 2 to 3 months of relief.  Patient elected for repeat injection.  Tolerated well per note below ? ?Procedure: Knee Joint Injection ?Side: Right ?Indication: Flare of osteoarthritis ? ?Risks explained and consent was given verbally. The site was cleaned with alcohol prep. A needle was introduced with an anterio-lateral approach. Injection given using 82m of 1% lidocaine without epinephrine and 146mof kenalog '40mg'$ /ml. This was well tolerated and resulted in symptomatic relief.  Needle was removed, hemostasis achieved, and post injection instructions were explained.   Pt was advised to call or return to clinic if these symptoms worsen or fail to improve as anticipated.   ? ?3. Chronic pain of left thumb ?4. Trigger finger of left thumb ?5. Chronic pain of right thumb ?6. Trigger finger of right thumb ?-Acute, uncomplicated, initial sports medicine visit ?- Bilateral triggering of first digit worsening over the past 1 to 2 weeks ?- Patient elected for trigger finger CSI bilaterally.  Tolerated well per note below ?- X-ray obtained in clinic.  My interpretation: No acute fracture or dislocation.  [facility] ? ?Procedure: Flexor Tendon Sheath Injection (Trigger Finger) ?Side: Bilateral 1st digit ? ?Indication: '@DIAGXWITHICD'$ @ ? ?After explaining the procedure, viable alternatives, risks, and answering any questions, consent was given verbally.  The site was cleaned with alcohol prep.  A steroid injection was performed under ultrasound guidance  using 0.80m79mf 1% lidocaine without epinephrine and 20 mg of Kenalog 40 with sterile technique.  This was well tolerated and resulted in  relief.  Needle was removed and dressing placed and post injection instructions were given including  a discussion of likely return of pain today after the anesthetic wears off (with the possibility of worsened pain) until the steroid starts to work in 3-5 days.  Procedure was repeated on contralateral side.  Pt was advised to call or return to clinic if these symptoms worsen or fail to improve as anticipated.  If not at least 50% better in 6 weeks would consider repeat injection.  ?  ?Pertinent previous records reviewed include none ?  ?Follow Up: 3 weeks for reevaluation ?  ?Subjective:   ?I, MoePincus Badderm serving as a scrEducation administratorr Doctor [employer]?Chief Complaint: right knee injection, bilateral thumb pain  ? ?HPI:  ? ?06/26/21 ?Patient is a 61 76ar old female complaining of right knee pain and bilateral thumb pain. Patient states that sometimes they wont bend at all its a click up and click down , been chronic hand pain but he clicking and locking has been going on for a couple of weeks, hx of right thumb fx , does get numbness and tingling, thumbs have been waking her up during the night , has been taking tylenol and using Voltaren gel and gabapentin,  ? ?Relevant Historical Information: History of stage IV liver cancer s/p resection, total left knee replacement ? ?Additional pertinent review of systems negative. ? ? ?Current Outpatient Medications:  ?  amLODipine (NORVASC) 10 MG tablet, TAKE 1 TABLET BY MOUTH EVERY DAY, Disp: 90 tablet, Rfl: 3 ?  aspirin 81  MG EC tablet, TAKE 1 TABLET BY MOUTH EVERY DAY *SWALLOW WHOLE*, Disp: 90 tablet, Rfl: 1 ?  ciclopirox (PENLAC) 8 % solution, Apply topically daily., Disp: , Rfl:  ?  clonazePAM (KLONOPIN) 0.5 MG tablet, TAKE 1 TABLET BY MOUTH 2 TIMES DAILY., Disp: 60 tablet, Rfl: 2 ?  fexofenadine (ALLEGRA) 180 MG tablet,  Take 1 tablet (180 mg total) by mouth daily., Disp: 90 tablet, Rfl: 3 ?  gabapentin (NEURONTIN) 300 MG capsule, Take 1 capsule (300 mg total) by mouth at bedtime., Disp: 90 capsule, Rfl: 0 ?  ketoconazole (NIZORAL) 2 % cream, Apply topically daily., Disp: , Rfl:  ?  lansoprazole (PREVACID) 30 MG capsule, Take 1 capsule (30 mg total) by mouth daily at 12 noon., Disp: 30 capsule, Rfl: 0 ?  metFORMIN (GLUCOPHAGE) 500 MG tablet, Take 1 tablet (500 mg total) by mouth 2 (two) times daily with a meal., Disp: 180 tablet, Rfl: 3 ?  methocarbamol (ROBAXIN) 500 MG tablet, Take 1 tablet (500 mg total) by mouth every 6 (six) hours as needed for muscle spasms., Disp: 40 tablet, Rfl: 0 ?  metoprolol succinate (TOPROL-XL) 100 MG 24 hr tablet, TAKE 1 TABLET BY MOUTH EVERY DAY EVERY EVENING WITH OR IMMEDIATELY FOLLOWING A MEAL, Disp: 90 tablet, Rfl: 0 ?  montelukast (SINGULAIR) 10 MG tablet, TAKE 1 TABLET BY MOUTH EVERYDAY AT BEDTIME, Disp: 90 tablet, Rfl: 1 ?  ondansetron (ZOFRAN-ODT) 4 MG disintegrating tablet, TAKE 1 TABLET BY MOUTH EVERY 8 HOURS AS NEEDED *INS MAX 18 TABS PER 21 DAYS*, Disp: 18 tablet, Rfl: 3 ?  pantoprazole (PROTONIX) 40 MG tablet, TAKE 1 TABLET BY MOUTH TWICE A DAY, Disp: 180 tablet, Rfl: 1 ?  PROAIR HFA 108 (90 Base) MCG/ACT inhaler, INHALE 2 PUFFS INTO THE LUNGS EVERY 6 (SIX) HOURS AS NEEDED. FOR SHORTNESS OF BREATH., Disp: 6.7 Inhaler, Rfl: 4 ?  rosuvastatin (CRESTOR) 10 MG tablet, TAKE 1 TABLET BY MOUTH EVERY DAY, Disp: 90 tablet, Rfl: 0 ?  scopolamine (TRANSDERM-SCOP) 1 MG/3DAYS, Place 1 patch (1.5 mg total) onto the skin every 3 (three) days., Disp: 10 patch, Rfl: 12 ?  valACYclovir (VALTREX) 1000 MG tablet, Take 1,000 mg by mouth 2 (two) times daily., Disp: , Rfl:  ?  valsartan (DIOVAN) 320 MG tablet, TAKE 1 TABLET BY MOUTH EVERY DAY, Disp: 90 tablet, Rfl: 0 ?  venlafaxine XR (EFFEXOR-XR) 150 MG 24 hr capsule, TAKE 1 CAPSULE BY MOUTH EVERY DAY IN THE MORNING, Disp: 90 capsule, Rfl: 1  ? ?Objective:   ?   ?Vitals:  ? 06/26/21 1345  ?BP: 126/76  ?Pulse: 78  ?SpO2: 97%  ?Weight: 207 lb (93.9 kg)  ?Height: '4\' 11"'$  (1.499 m)  ?  ?  ?Body mass index is 41.81 kg/m?.  ?  ?Physical Exam:   ? ?General:  awake, alert oriented, no acute distress nontoxic ?Skin: no suspicious lesions or rashes ?Neuro:sensation intact, no deficits, strength 5/5 with no deficits, no atrophy, normal muscle tone ?Psych: No signs of anxiety, depression or other mood disorder ?  ?Right knee: ?Positive for crepitus ?No swelling ?No deformity ?Neg fluid wave, joint milking ?ROM Flex 100, Ext 10 ?TTP medial lateral joint line ?NTTP over the quad tendon, medial fem condyle, lat fem condyle, patella, plica, patella tendon, tibial tuberostiy, fibular head, posterior fossa, pes anserine bursa, gerdy's tubercle,   ?Neg anterior and posterior drawer ?Neg lachman ?Neg sag sign ?Negative varus stress ?Negative valgus stress ?Negative McMurray ?  ?Bilateral hand/wrist:  No deformity or swelling appreciated. ?  Wrist ROM  Ext 90, flexion70, radial/ulnar deviation 30 ?TTP first MCP with palpable nodule bilaterally ?nttp over the snauff box, dorsal carpals, volar carpals, radial styloid, ulnar styloid,  tfcc ?Triggering of bilateral thumbs with flexion and extension ?Negative Tinel's ?Negative finklestein ?Neg tfcc bounce test ?No pain with resisted first digit ext, flex or deviation  ? ?Electronically signed by:  ?Leah [name] D.Merril Abbe ?[facility] ?2:12 PM 06/26/21 ?

## 2021-06-26 NOTE — Patient Instructions (Addendum)
Good to see you 3 week follow up  

## 2021-06-28 DIAGNOSIS — B353 Tinea pedis: Secondary | ICD-10-CM | POA: Diagnosis not present

## 2021-07-11 ENCOUNTER — Other Ambulatory Visit: Payer: Self-pay | Admitting: Physician Assistant

## 2021-07-11 DIAGNOSIS — G4733 Obstructive sleep apnea (adult) (pediatric): Secondary | ICD-10-CM | POA: Diagnosis not present

## 2021-07-17 ENCOUNTER — Other Ambulatory Visit: Payer: Self-pay

## 2021-07-17 ENCOUNTER — Other Ambulatory Visit: Payer: Self-pay | Admitting: Physician Assistant

## 2021-07-17 ENCOUNTER — Encounter: Payer: Self-pay | Admitting: Physician Assistant

## 2021-07-17 DIAGNOSIS — E118 Type 2 diabetes mellitus with unspecified complications: Secondary | ICD-10-CM | POA: Diagnosis not present

## 2021-07-17 DIAGNOSIS — E1159 Type 2 diabetes mellitus with other circulatory complications: Secondary | ICD-10-CM | POA: Diagnosis not present

## 2021-07-17 DIAGNOSIS — M25561 Pain in right knee: Secondary | ICD-10-CM | POA: Diagnosis not present

## 2021-07-19 ENCOUNTER — Inpatient Hospital Stay: Payer: BC Managed Care – PPO | Attending: Hematology & Oncology

## 2021-07-19 VITALS — BP 134/74 | HR 77 | Temp 98.4°F | Resp 18

## 2021-07-19 DIAGNOSIS — C7A00 Malignant carcinoid tumor of unspecified site: Secondary | ICD-10-CM | POA: Diagnosis not present

## 2021-07-19 DIAGNOSIS — C7B8 Other secondary neuroendocrine tumors: Secondary | ICD-10-CM

## 2021-07-19 DIAGNOSIS — C7B02 Secondary carcinoid tumors of liver: Secondary | ICD-10-CM | POA: Insufficient documentation

## 2021-07-19 MED ORDER — LANREOTIDE ACETATE 120 MG/0.5ML ~~LOC~~ SOLN
120.0000 mg | Freq: Once | SUBCUTANEOUS | Status: AC
  Administered 2021-07-19: 120 mg via SUBCUTANEOUS
  Filled 2021-07-19: qty 120

## 2021-07-19 NOTE — Patient Instructions (Signed)
Lanreotide injection What is this medication? LANREOTIDE (lan REE oh tide) is used to reduce blood levels of growth hormone in patients with a condition called acromegaly. It also works to slow or stop tumor growth in patients with neuroendocrine tumors and treat carcinoid syndrome. This medicine may be used for other purposes; ask your health care provider or pharmacist if you have questions. COMMON BRAND NAME(S): Somatuline Depot What should I tell my care team before I take this medication? They need to know if you have any of these conditions: diabetes gallbladder disease heart disease kidney disease liver disease thyroid disease an unusual or allergic reaction to lanreotide, other medicines, foods, dyes, or preservatives pregnant or trying to get pregnant breast-feeding How should I use this medication? This medicine is for injection under the skin. It is given by a health care professional in a hospital or clinic setting. Contact your pediatrician or health care professional regarding the use of this medicine in children. Special care may be needed. Overdosage: If you think you have taken too much of this medicine contact a poison control center or emergency room at once. NOTE: This medicine is only for you. Do not share this medicine with others. What if I miss a dose? It is important not to miss your dose. Call your doctor or health care professional if you are unable to keep an appointment. What may interact with this medication? This medicine may interact with the following medications: bromocriptine cyclosporine certain medicines for blood pressure, heart disease, irregular heart beat certain medicines for diabetes quinidine terfenadine This list may not describe all possible interactions. Give your health care provider a list of all the medicines, herbs, non-prescription drugs, or dietary supplements you use. Also tell them if you smoke, drink alcohol, or use illegal drugs.  Some items may interact with your medicine. What should I watch for while using this medication? Tell your doctor or healthcare professional if your symptoms do not start to get better or if they get worse. Visit your doctor or health care professional for regular checks on your progress. Your condition will be monitored carefully while you are receiving this medicine. This medicine may increase blood sugar. Ask your healthcare provider if changes in diet or medicines are needed if you have diabetes. You may need blood work done while you are taking this medicine. Women should inform their doctor if they wish to become pregnant or think they might be pregnant. There is a potential for serious side effects to an unborn child. Talk to your health care professional or pharmacist for more information. Do not breast-feed an infant while taking this medicine or for 6 months after stopping it. This medicine has caused ovarian failure in some women. This medicine may interfere with the ability to have a child. Talk with your doctor or health care professional if you are concerned about your fertility. What side effects may I notice from receiving this medication? Side effects that you should report to your doctor or health care professional as soon as possible: allergic reactions like skin rash, itching or hives, swelling of the face, lips, or tongue increased blood pressure severe stomach pain signs and symptoms of hgh blood sugar such as being more thirsty or hungry or having to urinate more than normal. You may also feel very tired or have blurry vision. signs and symptoms of low blood sugar such as feeling anxious; confusion; dizziness; increased hunger; unusually weak or tired; sweating; shakiness; cold; irritable; headache; blurred vision; fast   heartbeat; loss of consciousness unusually slow heartbeat Side effects that usually do not require medical attention (report to your doctor or health care  professional if they continue or are bothersome): constipation diarrhea dizziness headache muscle pain muscle spasms nausea pain, redness, or irritation at site where injected This list may not describe all possible side effects. Call your doctor for medical advice about side effects. You may report side effects to FDA at 1-800-FDA-1088. Where should I keep my medication? This drug is given in a hospital or clinic and will not be stored at home. NOTE: This sheet is a summary. It may not cover all possible information. If you have questions about this medicine, talk to your doctor, pharmacist, or health care provider.  2023 Elsevier/Gold Standard (2017-12-16 00:00:00)  

## 2021-08-03 ENCOUNTER — Other Ambulatory Visit: Payer: Self-pay | Admitting: Physician Assistant

## 2021-08-06 ENCOUNTER — Other Ambulatory Visit: Payer: Self-pay | Admitting: Physician Assistant

## 2021-08-06 DIAGNOSIS — J301 Allergic rhinitis due to pollen: Secondary | ICD-10-CM

## 2021-08-10 DIAGNOSIS — G4733 Obstructive sleep apnea (adult) (pediatric): Secondary | ICD-10-CM | POA: Diagnosis not present

## 2021-08-19 ENCOUNTER — Inpatient Hospital Stay: Payer: BC Managed Care – PPO

## 2021-08-19 ENCOUNTER — Encounter: Payer: Self-pay | Admitting: Family

## 2021-08-19 ENCOUNTER — Inpatient Hospital Stay (HOSPITAL_BASED_OUTPATIENT_CLINIC_OR_DEPARTMENT_OTHER): Payer: BC Managed Care – PPO | Admitting: Family

## 2021-08-19 ENCOUNTER — Inpatient Hospital Stay: Payer: BC Managed Care – PPO | Attending: Hematology & Oncology

## 2021-08-19 ENCOUNTER — Other Ambulatory Visit: Payer: Self-pay

## 2021-08-19 VITALS — BP 151/81 | HR 113 | Temp 98.0°F | Resp 18 | Ht 59.0 in | Wt 206.0 lb

## 2021-08-19 DIAGNOSIS — C7B8 Other secondary neuroendocrine tumors: Secondary | ICD-10-CM

## 2021-08-19 DIAGNOSIS — C7A00 Malignant carcinoid tumor of unspecified site: Secondary | ICD-10-CM | POA: Insufficient documentation

## 2021-08-19 DIAGNOSIS — R978 Other abnormal tumor markers: Secondary | ICD-10-CM | POA: Insufficient documentation

## 2021-08-19 DIAGNOSIS — C7B02 Secondary carcinoid tumors of liver: Secondary | ICD-10-CM | POA: Diagnosis not present

## 2021-08-19 LAB — CMP (CANCER CENTER ONLY)
ALT: 14 U/L (ref 0–44)
AST: 14 U/L — ABNORMAL LOW (ref 15–41)
Albumin: 4.1 g/dL (ref 3.5–5.0)
Alkaline Phosphatase: 87 U/L (ref 38–126)
Anion gap: 12 (ref 5–15)
BUN: 15 mg/dL (ref 8–23)
CO2: 22 mmol/L (ref 22–32)
Calcium: 9.4 mg/dL (ref 8.9–10.3)
Chloride: 102 mmol/L (ref 98–111)
Creatinine: 0.85 mg/dL (ref 0.44–1.00)
GFR, Estimated: >60 mL/min (ref >60)
Glucose, Bld: 239 mg/dL — ABNORMAL HIGH (ref 70–99)
Potassium: 3.7 mmol/L (ref 3.5–5.1)
Sodium: 136 mmol/L (ref 135–145)
Total Bilirubin: 0.4 mg/dL (ref 0.3–1.2)
Total Protein: 7.3 g/dL (ref 6.5–8.1)

## 2021-08-19 LAB — CBC WITH DIFFERENTIAL (CANCER CENTER ONLY)
Abs Immature Granulocytes: 0.05 10*3/uL (ref 0.00–0.07)
Basophils Absolute: 0.1 10*3/uL (ref 0.0–0.1)
Basophils Relative: 1 %
Eosinophils Absolute: 0.1 10*3/uL (ref 0.0–0.5)
Eosinophils Relative: 1 %
HCT: 37.8 % (ref 36.0–46.0)
Hemoglobin: 12 g/dL (ref 12.0–15.0)
Immature Granulocytes: 0 %
Lymphocytes Relative: 28 %
Lymphs Abs: 3.5 10*3/uL (ref 0.7–4.0)
MCH: 28.2 pg (ref 26.0–34.0)
MCHC: 31.7 g/dL (ref 30.0–36.0)
MCV: 88.7 fL (ref 80.0–100.0)
Monocytes Absolute: 0.7 10*3/uL (ref 0.1–1.0)
Monocytes Relative: 6 %
Neutro Abs: 7.9 10*3/uL — ABNORMAL HIGH (ref 1.7–7.7)
Neutrophils Relative %: 64 %
Platelet Count: 277 10*3/uL (ref 150–400)
RBC: 4.26 MIL/uL (ref 3.87–5.11)
RDW: 15.3 % (ref 11.5–15.5)
WBC Count: 12.4 10*3/uL — ABNORMAL HIGH (ref 4.0–10.5)
nRBC: 0 % (ref 0.0–0.2)

## 2021-08-19 LAB — LACTATE DEHYDROGENASE: LDH: 162 U/L (ref 98–192)

## 2021-08-19 MED ORDER — LANREOTIDE ACETATE 120 MG/0.5ML ~~LOC~~ SOLN
120.0000 mg | Freq: Once | SUBCUTANEOUS | Status: AC
  Administered 2021-08-19: 120 mg via SUBCUTANEOUS
  Filled 2021-08-19: qty 120

## 2021-08-19 NOTE — Progress Notes (Signed)
Hematology and Oncology Follow Up Visit  Leah [name] [id number] 1960-06-01 61 y.o. 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 She is staying busy at home and notes some fatigue at times.  Chromogranin A in April was stable at 52. Today's result pending.  She is waiting to schedule her second opinion appointment with [facility]. No fever, chills, n/v, cough, rash, dizziness, SOB, chest pain, palpitations, abdominal pain or changes in bowel or bladder habits.  No swelling, tenderness, numbness or tingling in her extremities at this time.  She got an injection in both hands for trigger finger and has had no other issues.  No falls or syncope reported.  Appetite and hydration are good. Weight is stable at 206 lbs.   ECOG Performance Status: 1 - Symptomatic but completely ambulatory  Medications:  Allergies as of 08/19/2021   No Known Allergies      Medication List        Accurate as of August 19, 2021 12:18 PM. If you have any questions, ask your nurse or doctor.          amLODipine 10 MG tablet Commonly known as: NORVASC TAKE 1 TABLET BY MOUTH EVERY DAY   aspirin EC 81 MG tablet TAKE 1 TABLET BY MOUTH EVERY DAY *SWALLOW WHOLE*   ciclopirox 8 % solution Commonly known as: PENLAC Apply topically daily.   clonazePAM 0.5 MG tablet Commonly known as: KLONOPIN TAKE 1 TABLET BY MOUTH 2 TIMES DAILY.   fexofenadine 180 MG tablet Commonly known as: ALLEGRA Take 1 tablet (180 mg total) by mouth daily.   gabapentin 300 MG capsule Commonly known as: NEURONTIN Take 1 capsule (300 mg total) by mouth at bedtime.   ketoconazole 2 % cream Commonly known as: NIZORAL Apply topically daily.   lansoprazole 30 MG capsule Commonly known as:  PREVACID TAKE 1 CAPSULE BY MOUTH DAILY AT 12 NOON.   metFORMIN 500 MG tablet Commonly known as: GLUCOPHAGE Take 1 tablet (500 mg total) by mouth 2 (two) times daily with a meal.   methocarbamol 500 MG tablet Commonly known as: ROBAXIN Take 1 tablet (500 mg total) by mouth every 6 (six) hours as needed for muscle spasms.   metoprolol succinate 100 MG 24 hr tablet Commonly known as: TOPROL-XL TAKE 1 TABLET BY MOUTH EVERY DAY EVERY EVENING WITH OR IMMEDIATELY FOLLOWING A MEAL   montelukast 10 MG tablet Commonly known as: SINGULAIR TAKE 1 TABLET BY MOUTH EVERYDAY AT BEDTIME   ondansetron 4 MG disintegrating tablet Commonly known as: ZOFRAN-ODT TAKE 1 TABLET BY MOUTH EVERY 8 HOURS AS NEEDED *INS MAX 18 TABS PER 21 DAYS*   pantoprazole 40 MG tablet Commonly known as: PROTONIX TAKE 1 TABLET BY MOUTH TWICE A DAY   ProAir HFA 108 (90 Base) MCG/ACT inhaler Generic drug: albuterol INHALE 2 PUFFS INTO THE LUNGS EVERY 6 (SIX) HOURS AS NEEDED. FOR SHORTNESS OF BREATH.   rosuvastatin 10 MG tablet Commonly known as: CRESTOR TAKE 1 TABLET BY MOUTH EVERY DAY   scopolamine 1 MG/3DAYS Commonly known as: TRANSDERM-SCOP Place 1 patch (1.5 mg total) onto the skin every 3 (three) days.   valACYclovir 1000 MG tablet Commonly known as: VALTREX Take 1,000 mg by mouth 2 (two) times daily.   valsartan  320 MG tablet Commonly known as: DIOVAN TAKE 1 TABLET BY MOUTH EVERY DAY   venlafaxine XR 150 MG 24 hr capsule Commonly known as: EFFEXOR-XR TAKE 1 CAPSULE BY MOUTH EVERY DAY IN THE MORNING        Allergies: No Known Allergies  Past Medical History, Surgical history, Social history, and Family History were reviewed and updated.  Review of Systems: All other 10 point review of systems is negative.   Physical Exam:  vitals were not taken for this visit.   Wt Readings from Last 3 Encounters:  06/26/21 207 lb (93.9 kg)  05/20/21 205 lb (93 kg)  05/09/21 201 lb (91.2 kg)    Ocular:  Sclerae unicteric, pupils equal, round and reactive to light Ear-nose-throat: Oropharynx clear, dentition fair Lymphatic: No cervical or supraclavicular adenopathy Lungs no rales or rhonchi, good excursion bilaterally Heart regular rate and rhythm, no murmur appreciated Abd soft, nontender, positive bowel sounds MSK no focal spinal tenderness, no joint edema Neuro: non-focal, well-oriented, appropriate affect Breasts: Deferred   Lab Results  Component Value Date   WBC 10.1 05/20/2021   HGB 12.1 05/20/2021   HCT 37.6 05/20/2021   MCV 86.4 05/20/2021   PLT 267 05/20/2021   Lab Results  Component Value Date   FERRITIN 382 (H) 10/18/2008   IRON <10 (L) 10/18/2008   TIBC NOT CALC Not calculated due to Iron <10. 10/18/2008   UIBC 156 10/18/2008   IRONPCTSAT NOT CALC Not calculated due to Iron <10. 10/18/2008   Lab Results  Component Value Date   RETICCTPCT 0.9 10/18/2008   RBC 4.35 05/20/2021   No results found for: "KPAFRELGTCHN", "LAMBDASER", "KAPLAMBRATIO" No results found for: "IGGSERUM", "IGA", "IGMSERUM" No results found for: "TOTALPROTELP", "ALBUMINELP", "A1GS", "A2GS", "BETS", "BETA2SER", "GAMS", "MSPIKE", "SPEI"   Chemistry      Component Value Date/Time   NA 137 05/20/2021 1008   NA 140 01/20/2017 1416   NA 141 01/17/2016 1047   K 3.7 05/20/2021 1008   K 3.9 01/20/2017 1416   K 3.2 (L) 01/17/2016 1047   CL 104 05/20/2021 1008   CL 102 01/20/2017 1416   CO2 25 05/20/2021 1008   CO2 26 01/20/2017 1416   CO2 27 01/17/2016 1047   BUN 13 05/20/2021 1008   BUN 15 01/20/2017 1416   BUN 6.6 (L) 01/17/2016 1047   CREATININE 0.69 05/20/2021 1008   CREATININE 1.1 01/20/2017 1416   CREATININE 0.7 01/17/2016 1047      Component Value Date/Time   CALCIUM 9.5 05/20/2021 1008   CALCIUM 9.1 01/20/2017 1416   CALCIUM 8.7 01/17/2016 1047   ALKPHOS 88 05/20/2021 1008   ALKPHOS 84 01/20/2017 1416   ALKPHOS 90 01/17/2016 1047   AST 17 05/20/2021 1008   AST 34 01/17/2016  1047   ALT 16 05/20/2021 1008   ALT 35 01/20/2017 1416   ALT 38 01/17/2016 1047   BILITOT 0.4 05/20/2021 1008   BILITOT 0.42 01/17/2016 1047       Impression and Plan: Leah [name] is a very pleasant 61 yo caucasian female with long standing history of metastatic carcinoid with recurrent liver metastasis.  Chromogranin A pending.  Somatuline given today as planned.  Monthly injection with follow-up in 3 months.   Lottie Dawson, NP 7/10/202312:18 PM

## 2021-08-19 NOTE — Patient Instructions (Signed)
Lanreotide injection What is this medication? LANREOTIDE (lan REE oh tide) is used to reduce blood levels of growth hormone in patients with a condition called acromegaly. It also works to slow or stop tumor growth in patients with neuroendocrine tumors and treat carcinoid syndrome. This medicine may be used for other purposes; ask your health care provider or pharmacist if you have questions. COMMON BRAND NAME(S): Somatuline Depot What should I tell my care team before I take this medication? They need to know if you have any of these conditions: diabetes gallbladder disease heart disease kidney disease liver disease thyroid disease an unusual or allergic reaction to lanreotide, other medicines, foods, dyes, or preservatives pregnant or trying to get pregnant breast-feeding How should I use this medication? This medicine is for injection under the skin. It is given by a health care professional in a hospital or clinic setting. Contact your pediatrician or health care professional regarding the use of this medicine in children. Special care may be needed. Overdosage: If you think you have taken too much of this medicine contact a poison control center or emergency room at once. NOTE: This medicine is only for you. Do not share this medicine with others. What if I miss a dose? It is important not to miss your dose. Call your doctor or health care professional if you are unable to keep an appointment. What may interact with this medication? This medicine may interact with the following medications: bromocriptine cyclosporine certain medicines for blood pressure, heart disease, irregular heart beat certain medicines for diabetes quinidine terfenadine This list may not describe all possible interactions. Give your health care provider a list of all the medicines, herbs, non-prescription drugs, or dietary supplements you use. Also tell them if you smoke, drink alcohol, or use illegal drugs.  Some items may interact with your medicine. What should I watch for while using this medication? Tell your doctor or healthcare professional if your symptoms do not start to get better or if they get worse. Visit your doctor or health care professional for regular checks on your progress. Your condition will be monitored carefully while you are receiving this medicine. This medicine may increase blood sugar. Ask your healthcare provider if changes in diet or medicines are needed if you have diabetes. You may need blood work done while you are taking this medicine. Women should inform their doctor if they wish to become pregnant or think they might be pregnant. There is a potential for serious side effects to an unborn child. Talk to your health care professional or pharmacist for more information. Do not breast-feed an infant while taking this medicine or for 6 months after stopping it. This medicine has caused ovarian failure in some women. This medicine may interfere with the ability to have a child. Talk with your doctor or health care professional if you are concerned about your fertility. What side effects may I notice from receiving this medication? Side effects that you should report to your doctor or health care professional as soon as possible: allergic reactions like skin rash, itching or hives, swelling of the face, lips, or tongue increased blood pressure severe stomach pain signs and symptoms of hgh blood sugar such as being more thirsty or hungry or having to urinate more than normal. You may also feel very tired or have blurry vision. signs and symptoms of low blood sugar such as feeling anxious; confusion; dizziness; increased hunger; unusually weak or tired; sweating; shakiness; cold; irritable; headache; blurred vision; fast   heartbeat; loss of consciousness unusually slow heartbeat Side effects that usually do not require medical attention (report to your doctor or health care  professional if they continue or are bothersome): constipation diarrhea dizziness headache muscle pain muscle spasms nausea pain, redness, or irritation at site where injected This list may not describe all possible side effects. Call your doctor for medical advice about side effects. You may report side effects to FDA at 1-800-FDA-1088. Where should I keep my medication? This drug is given in a hospital or clinic and will not be stored at home. NOTE: This sheet is a summary. It may not cover all possible information. If you have questions about this medicine, talk to your doctor, pharmacist, or health care provider.  2023 Elsevier/Gold Standard (2017-12-16 00:00:00)  

## 2021-08-20 LAB — CHROMOGRANIN A: Chromogranin A (ng/mL): 127.7 ng/mL — ABNORMAL HIGH (ref 0.0–101.8)

## 2021-08-22 ENCOUNTER — Other Ambulatory Visit: Payer: Self-pay

## 2021-08-22 ENCOUNTER — Other Ambulatory Visit: Payer: BC Managed Care – PPO

## 2021-08-22 ENCOUNTER — Ambulatory Visit: Payer: BC Managed Care – PPO

## 2021-08-22 ENCOUNTER — Ambulatory Visit: Payer: BC Managed Care – PPO | Admitting: Hematology & Oncology

## 2021-08-22 ENCOUNTER — Encounter: Payer: Self-pay | Admitting: Physician Assistant

## 2021-08-22 MED ORDER — METOPROLOL SUCCINATE ER 100 MG PO TB24: ORAL_TABLET | ORAL | 1 refills | Status: DC

## 2021-08-22 NOTE — Telephone Encounter (Signed)
This was last filled by a historical provider Briscoe Deutscher DO, ok to send in refill?

## 2021-08-22 NOTE — Telephone Encounter (Signed)
Rx sent to pharmacy and pt advised

## 2021-08-26 ENCOUNTER — Other Ambulatory Visit: Payer: Self-pay | Admitting: Physician Assistant

## 2021-08-26 DIAGNOSIS — I10 Essential (primary) hypertension: Secondary | ICD-10-CM

## 2021-08-29 ENCOUNTER — Ambulatory Visit: Payer: BC Managed Care – PPO | Admitting: Sports Medicine

## 2021-09-05 DIAGNOSIS — E782 Mixed hyperlipidemia: Secondary | ICD-10-CM | POA: Diagnosis not present

## 2021-09-05 DIAGNOSIS — K760 Fatty (change of) liver, not elsewhere classified: Secondary | ICD-10-CM | POA: Diagnosis not present

## 2021-09-05 DIAGNOSIS — E65 Localized adiposity: Secondary | ICD-10-CM | POA: Diagnosis not present

## 2021-09-05 DIAGNOSIS — E118 Type 2 diabetes mellitus with unspecified complications: Secondary | ICD-10-CM | POA: Diagnosis not present

## 2021-09-09 DIAGNOSIS — G4733 Obstructive sleep apnea (adult) (pediatric): Secondary | ICD-10-CM | POA: Diagnosis not present

## 2021-09-10 DIAGNOSIS — G4733 Obstructive sleep apnea (adult) (pediatric): Secondary | ICD-10-CM | POA: Diagnosis not present

## 2021-09-13 ENCOUNTER — Inpatient Hospital Stay: Payer: BC Managed Care – PPO | Attending: Hematology & Oncology

## 2021-09-13 VITALS — BP 151/80 | HR 86 | Temp 98.2°F | Resp 18

## 2021-09-13 DIAGNOSIS — C7B02 Secondary carcinoid tumors of liver: Secondary | ICD-10-CM | POA: Insufficient documentation

## 2021-09-13 DIAGNOSIS — C7B8 Other secondary neuroendocrine tumors: Secondary | ICD-10-CM

## 2021-09-13 DIAGNOSIS — C7A00 Malignant carcinoid tumor of unspecified site: Secondary | ICD-10-CM | POA: Diagnosis not present

## 2021-09-13 MED ORDER — LANREOTIDE ACETATE 120 MG/0.5ML ~~LOC~~ SOLN
120.0000 mg | Freq: Once | SUBCUTANEOUS | Status: AC
  Administered 2021-09-13: 120 mg via SUBCUTANEOUS
  Filled 2021-09-13: qty 120

## 2021-09-13 NOTE — Patient Instructions (Signed)
Lanreotide Injection What is this medication? LANREOTIDE (lan REE oh tide) treats high levels of growth hormone (acromegaly). It is used when other therapies have not worked well enough or cannot be tolerated. It works by reducing the amount of growth hormone your body makes. This reduces symptoms and the risk of health problems caused by too much growth hormone, such as diabetes and heart disease. It may also be used to treat neuroendocrine tumors, a cancer of the cells that release hormones and other substances in your body. It works by slowing down the release of these substances from the cells. This slows tumor growth. It also decreases the symptoms of carcinoid syndrome, such as flushing or diarrhea. This medicine may be used for other purposes; ask your health care provider or pharmacist if you have questions. COMMON BRAND NAME(S): Somatuline Depot What should I tell my care team before I take this medication? They need to know if you have any of these conditions: Diabetes Gallbladder disease Heart disease Kidney disease Liver disease Thyroid disease An unusual or allergic reaction to lanreotide, other medications, foods, dyes, or preservatives Pregnant or trying to get pregnant Breast-feeding How should I use this medication? This medication is injected under the skin. It is given by your care team in a hospital or clinic setting. Talk to your care team about the use of this medication in children. Special care may be needed. Overdosage: If you think you have taken too much of this medicine contact a poison control center or emergency room at once. NOTE: This medicine is only for you. Do not share this medicine with others. What if I miss a dose? Keep appointments for follow-up doses. It is important not to miss your dose. Call your care team if you are unable to keep an appointment. What may interact with this medication? Bromocriptine Cyclosporine Certain medications for blood  pressure, heart disease, irregular heartbeat Certain medications for diabetes Quinidine Terfenadine This list may not describe all possible interactions. Give your health care provider a list of all the medicines, herbs, non-prescription drugs, or dietary supplements you use. Also tell them if you smoke, drink alcohol, or use illegal drugs. Some items may interact with your medicine. What should I watch for while using this medication? Visit your care team for regular checks on your progress. Tell your care team if your symptoms do not start to get better or if they get worse. Your condition will be monitored carefully while you are receiving this medication. You may need blood work while you are taking this medication. This medication may increase blood sugar. The risk may be higher in patients who already have diabetes. Ask your care team what you can do to lower your risk of diabetes while taking this medication. Talk to your care team if you wish to become pregnant or think you may be pregnant. This medication can cause serious birth defects. Do not breast-feed while taking this medication and for 6 months after stopping therapy. This medication may cause infertility. Talk to your care team if you are concerned about your fertility. What side effects may I notice from receiving this medication? Side effects that you should report to your care team as soon as possible: Allergic reactions--skin rash, itching, hives, swelling of the face, lips, tongue, or throat Gallbladder problems--severe stomach pain, nausea, vomiting, fever High blood sugar (hyperglycemia)--increased thirst or amount of urine, unusual weakness or fatigue, blurry vision Increase in blood pressure Low blood sugar (hypoglycemia)--tremors or shaking, anxiety, sweating, cold   or clammy skin, confusion, dizziness, rapid heartbeat Low thyroid levels (hypothyroidism)--unusual weakness or fatigue, increased sensitivity to cold,  constipation, hair loss, dry skin, weight gain, feelings of depression Slow heartbeat--dizziness, feeling faint or lightheaded, confusion, trouble breathing, unusual weakness or fatigue Side effects that usually do not require medical attention (report to your care team if they continue or are bothersome): Diarrhea Dizziness Headache Muscle spasms Nausea Pain, redness, irritation, or bruising at the injection site Stomach pain This list may not describe all possible side effects. Call your doctor for medical advice about side effects. You may report side effects to FDA at 1-800-FDA-1088. Where should I keep my medication? This medication is given in a hospital or clinic. It will not be stored at home. NOTE: This sheet is a summary. It may not cover all possible information. If you have questions about this medicine, talk to your doctor, pharmacist, or health care provider.  2023 Elsevier/Gold Standard (2021-04-17 00:00:00)  

## 2021-09-16 NOTE — Progress Notes (Deleted)
Benito Mccreedy D.[place] Phone: [phone]   Assessment and Plan:     There are no diagnoses linked to this encounter.  ***   Pertinent previous records reviewed include ***   Follow Up: ***     Subjective:   I, Akili Corsetti, am serving as a Education administrator for [name]   Chief Complaint: right knee injection, bilateral thumb pain    HPI:    06/26/21 Patient is a 61 year old female complaining of right knee pain and bilateral thumb pain. Patient states that sometimes they wont bend at all its a click up and click down , been chronic hand pain but he clicking and locking has been going on for a couple of weeks, hx of right thumb fx , does get numbness and tingling, thumbs have been waking her up during the night , has been taking tylenol and using Voltaren gel and gabapentin,   09/24/2021 Patient states    Relevant Historical Information: History of stage IV liver cancer s/p resection, total left knee replacement  Additional pertinent review of systems negative.   Current Outpatient Medications:    amLODipine (NORVASC) 10 MG tablet, TAKE 1 TABLET BY MOUTH EVERY DAY, Disp: 90 tablet, Rfl: 3   aspirin EC 81 MG tablet, TAKE 1 TABLET BY MOUTH EVERY DAY *SWALLOW WHOLE*, Disp: 90 tablet, Rfl: 1   ciclopirox (PENLAC) 8 % solution, Apply topically daily., Disp: , Rfl:    clonazePAM (KLONOPIN) 0.5 MG tablet, TAKE 1 TABLET BY MOUTH 2 TIMES DAILY., Disp: 60 tablet, Rfl: 2   fexofenadine (ALLEGRA) 180 MG tablet, Take 1 tablet (180 mg total) by mouth daily., Disp: 90 tablet, Rfl: 3   gabapentin (NEURONTIN) 300 MG capsule, Take 1 capsule (300 mg total) by mouth at bedtime., Disp: 90 capsule, Rfl: 0   ketoconazole (NIZORAL) 2 % cream, Apply topically daily., Disp: , Rfl:    lansoprazole (PREVACID) 30 MG capsule, TAKE 1 CAPSULE BY MOUTH DAILY AT 12 NOON., Disp: 30 capsule, Rfl: 0   metFORMIN (GLUCOPHAGE)  500 MG tablet, Take 1 tablet (500 mg total) by mouth 2 (two) times daily with a meal., Disp: 180 tablet, Rfl: 3   methocarbamol (ROBAXIN) 500 MG tablet, Take 1 tablet (500 mg total) by mouth every 6 (six) hours as needed for muscle spasms., Disp: 40 tablet, Rfl: 0   metoprolol succinate (TOPROL-XL) 100 MG 24 hr tablet, TAKE 1 TABLET BY MOUTH EVERY DAY EVERY EVENING WITH OR IMMEDIATELY FOLLOWING A MEAL, Disp: 90 tablet, Rfl: 1   montelukast (SINGULAIR) 10 MG tablet, TAKE 1 TABLET BY MOUTH EVERYDAY AT BEDTIME, Disp: 90 tablet, Rfl: 2   ondansetron (ZOFRAN-ODT) 4 MG disintegrating tablet, TAKE 1 TABLET BY MOUTH EVERY 8 HOURS AS NEEDED *INS MAX 18 TABS PER 21 DAYS*, Disp: 18 tablet, Rfl: 3   pantoprazole (PROTONIX) 40 MG tablet, TAKE 1 TABLET BY MOUTH TWICE A DAY, Disp: 180 tablet, Rfl: 1   PROAIR HFA 108 (90 Base) MCG/ACT inhaler, INHALE 2 PUFFS INTO THE LUNGS EVERY 6 (SIX) HOURS AS NEEDED. FOR SHORTNESS OF BREATH., Disp: 6.7 Inhaler, Rfl: 4   rosuvastatin (CRESTOR) 10 MG tablet, TAKE 1 TABLET BY MOUTH EVERY DAY, Disp: 90 tablet, Rfl: 0   scopolamine (TRANSDERM-SCOP) 1 MG/3DAYS, Place 1 patch (1.5 mg total) onto the skin every 3 (three) days., Disp: 10 patch, Rfl: 12   valACYclovir (VALTREX) 1000 MG tablet, Take 1,000 mg by mouth 2 (  two) times daily., Disp: , Rfl:    valsartan (DIOVAN) 320 MG tablet, TAKE 1 TABLET BY MOUTH EVERY DAY, Disp: 90 tablet, Rfl: 0   venlafaxine XR (EFFEXOR-XR) 150 MG 24 hr capsule, TAKE 1 CAPSULE BY MOUTH EVERY DAY IN THE MORNING, Disp: 90 capsule, Rfl: 1   Objective:     There were no vitals filed for this visit.    There is no height or weight on file to calculate BMI.    Physical Exam:    ***   Electronically signed by:  Benito Mccreedy D.[facility] 9:23 AM 09/16/21

## 2021-09-18 ENCOUNTER — Encounter (INDEPENDENT_AMBULATORY_CARE_PROVIDER_SITE_OTHER): Payer: Self-pay

## 2021-09-24 ENCOUNTER — Ambulatory Visit: Payer: BC Managed Care – PPO | Admitting: Sports Medicine

## 2021-09-25 ENCOUNTER — Ambulatory Visit (INDEPENDENT_AMBULATORY_CARE_PROVIDER_SITE_OTHER): Payer: BC Managed Care – PPO | Admitting: Sports Medicine

## 2021-09-25 VITALS — Ht 59.0 in | Wt 200.0 lb

## 2021-09-25 DIAGNOSIS — M1711 Unilateral primary osteoarthritis, right knee: Secondary | ICD-10-CM | POA: Diagnosis not present

## 2021-09-25 DIAGNOSIS — G8929 Other chronic pain: Secondary | ICD-10-CM

## 2021-09-25 DIAGNOSIS — M25561 Pain in right knee: Secondary | ICD-10-CM | POA: Diagnosis not present

## 2021-09-25 NOTE — Progress Notes (Signed)
Leah [name] Leah [name] Phone: [phone]   Assessment and Plan:    1. Osteoarthritis of right knee, unspecified osteoarthritis type 2. Chronic pain of right knee  -Chronic with exacerbation, subsequent visit - Recurrent flare of right knee pain consistent with osteoarthritis - Patient has had significant improvement with CSI in the past and has received 2 to 3 months of relief since previous injection on 06/26/2021 and elects for repeat injection today.  Tolerated well per note below  Procedure: Knee Joint Injection Side: Right Indication: Flare of osteoarthritis  Risks explained and consent was given verbally. The site was cleaned with alcohol prep. A needle was introduced with an anterio-lateral approach. Injection given using 55m of 1% lidocaine without epinephrine and 114mof kenalog '40mg'$ /ml. This was well tolerated and resulted in symptomatic relief.  Needle was removed, hemostasis achieved, and post injection instructions were explained.   Pt was advised to call or return to clinic if these symptoms worsen or fail to improve as anticipated.   Pertinent previous records reviewed include none   Follow Up: As needed if no improvement or worsening of symptoms.  Could also repeat CSI if needed, hoping to wait a minimum of 3 months between injections until after 12/26/2021     Subjective:   I, Leah [name], am serving as a scEducation administratoror Doctor BeGlennon [name] Chief Complaint: right knee injection, bilateral thumb pain    HPI:    06/26/21 Patient is a [age]ear old female complaining of right knee pain and bilateral thumb pain. Patient states that sometimes they wont bend at all its a click up and click down , been chronic hand pain but he clicking and locking has been going on for a couple of weeks, hx of right thumb fx , does get numbness and tingling, thumbs have been waking her up during the night , has  been taking tylenol and using Voltaren gel and gabapentin,    09/25/2021 Patient states that a few weeks ago her knee was awful but now it is feeling better , hands are good , wants injection    Relevant Historical Information: History of stage IV liver cancer s/p resection, total left knee replacement  Additional pertinent review of systems negative.   Current Outpatient Medications:    amLODipine (NORVASC) 10 MG tablet, TAKE 1 TABLET BY MOUTH EVERY DAY, Disp: 90 tablet, Rfl: 3   aspirin EC 81 MG tablet, TAKE 1 TABLET BY MOUTH EVERY DAY *SWALLOW WHOLE*, Disp: 90 tablet, Rfl: 1   ciclopirox (PENLAC) 8 % solution, Apply topically daily., Disp: , Rfl:    clonazePAM (KLONOPIN) 0.5 MG tablet, TAKE 1 TABLET BY MOUTH 2 TIMES DAILY., Disp: 60 tablet, Rfl: 2   fexofenadine (ALLEGRA) 180 MG tablet, Take 1 tablet (180 mg total) by mouth daily., Disp: 90 tablet, Rfl: 3   ketoconazole (NIZORAL) 2 % cream, Apply topically daily., Disp: , Rfl:    lansoprazole (PREVACID) 30 MG capsule, TAKE 1 CAPSULE BY MOUTH DAILY AT 12 NOON., Disp: 30 capsule, Rfl: 0   metFORMIN (GLUCOPHAGE) 500 MG tablet, Take 1 tablet (500 mg total) by mouth 2 (two) times daily with a meal., Disp: 180 tablet, Rfl: 3   methocarbamol (ROBAXIN) 500 MG tablet, Take 1 tablet (500 mg total) by mouth every 6 (six) hours as needed for muscle spasms., Disp: 40 tablet, Rfl: 0   metoprolol succinate (TOPROL-XL) 100 MG 24 hr tablet, TAKE  1 TABLET BY MOUTH EVERY DAY EVERY EVENING WITH OR IMMEDIATELY FOLLOWING A MEAL, Disp: 90 tablet, Rfl: 1   montelukast (SINGULAIR) 10 MG tablet, TAKE 1 TABLET BY MOUTH EVERYDAY AT BEDTIME, Disp: 90 tablet, Rfl: 2   ondansetron (ZOFRAN-ODT) 4 MG disintegrating tablet, TAKE 1 TABLET BY MOUTH EVERY 8 HOURS AS NEEDED *INS MAX 18 TABS PER 21 DAYS*, Disp: 18 tablet, Rfl: 3   pantoprazole (PROTONIX) 40 MG tablet, TAKE 1 TABLET BY MOUTH TWICE A DAY, Disp: 180 tablet, Rfl: 1   PROAIR HFA 108 (90 Base) MCG/ACT inhaler, INHALE  2 PUFFS INTO THE LUNGS EVERY 6 (SIX) HOURS AS NEEDED. FOR SHORTNESS OF BREATH., Disp: 6.7 Inhaler, Rfl: 4   rosuvastatin (CRESTOR) 10 MG tablet, TAKE 1 TABLET BY MOUTH EVERY DAY, Disp: 90 tablet, Rfl: 0   scopolamine (TRANSDERM-SCOP) 1 MG/3DAYS, Place 1 patch (1.5 mg total) onto the skin every 3 (three) days., Disp: 10 patch, Rfl: 12   valACYclovir (VALTREX) 1000 MG tablet, Take 1,000 mg by mouth 2 (two) times daily., Disp: , Rfl:    valsartan (DIOVAN) 320 MG tablet, TAKE 1 TABLET BY MOUTH EVERY DAY, Disp: 90 tablet, Rfl: 0   venlafaxine XR (EFFEXOR-XR) 150 MG 24 hr capsule, TAKE 1 CAPSULE BY MOUTH EVERY DAY IN THE MORNING, Disp: 90 capsule, Rfl: 1   gabapentin (NEURONTIN) 300 MG capsule, Take 1 capsule (300 mg total) by mouth at bedtime., Disp: 90 capsule, Rfl: 0   Objective:     Vitals:   09/25/21 1412  Weight: 200 lb (90.7 kg)  Height: '4\' 11"'$  (1.499 m)      Body mass index is 40.4 kg/m.    Physical Exam:    General:  awake, alert oriented, no acute distress nontoxic Skin: no suspicious lesions or rashes Neuro:sensation intact, no deficits, strength 5/5 with no deficits, no atrophy, normal muscle tone Psych: No signs of anxiety, depression or other mood disorder   Right knee: Positive for crepitus No swelling No deformity Neg fluid wave, joint milking ROM Flex 100, Ext 10 TTP medial lateral joint line NTTP over the quad tendon, medial fem condyle, lat fem condyle, patella, plica, patella tendon, tibial tuberostiy, fibular head, posterior fossa, pes anserine bursa, gerdy's tubercle,   Neg anterior and posterior drawer Neg lachman Neg sag sign Negative varus stress Negative valgus stress Negative McMurray   Electronically signed by:  Leah [name] D.[facility] 2:37 PM 09/25/21

## 2021-09-27 ENCOUNTER — Other Ambulatory Visit: Payer: Self-pay | Admitting: Physician Assistant

## 2021-09-27 DIAGNOSIS — H34831 Tributary (branch) retinal vein occlusion, right eye, with macular edema: Secondary | ICD-10-CM | POA: Diagnosis not present

## 2021-09-27 DIAGNOSIS — H35033 Hypertensive retinopathy, bilateral: Secondary | ICD-10-CM | POA: Diagnosis not present

## 2021-09-27 DIAGNOSIS — H3581 Retinal edema: Secondary | ICD-10-CM | POA: Diagnosis not present

## 2021-09-27 DIAGNOSIS — D869 Sarcoidosis, unspecified: Secondary | ICD-10-CM | POA: Diagnosis not present

## 2021-09-27 LAB — HM DIABETES EYE EXAM

## 2021-09-29 ENCOUNTER — Other Ambulatory Visit: Payer: Self-pay | Admitting: Physician Assistant

## 2021-10-03 IMAGING — MG DIGITAL SCREENING BREAST BILAT IMPLANT W/ TOMO W/ CAD
8 of 13 series · 8 of 29 positions shown · non-contrast
Comparison: Previous exam(s).

ACR Breast Density Category a: The breast tissue is almost entirely
fatty.

CLINICAL DATA: Screening.

EXAM:
DIGITAL SCREENING BILATERAL MAMMOGRAM WITH IMPLANTS, CAD AND TOMO
The patient has retropectoral implants. Standard and implant
displaced views were performed.

[R CC]
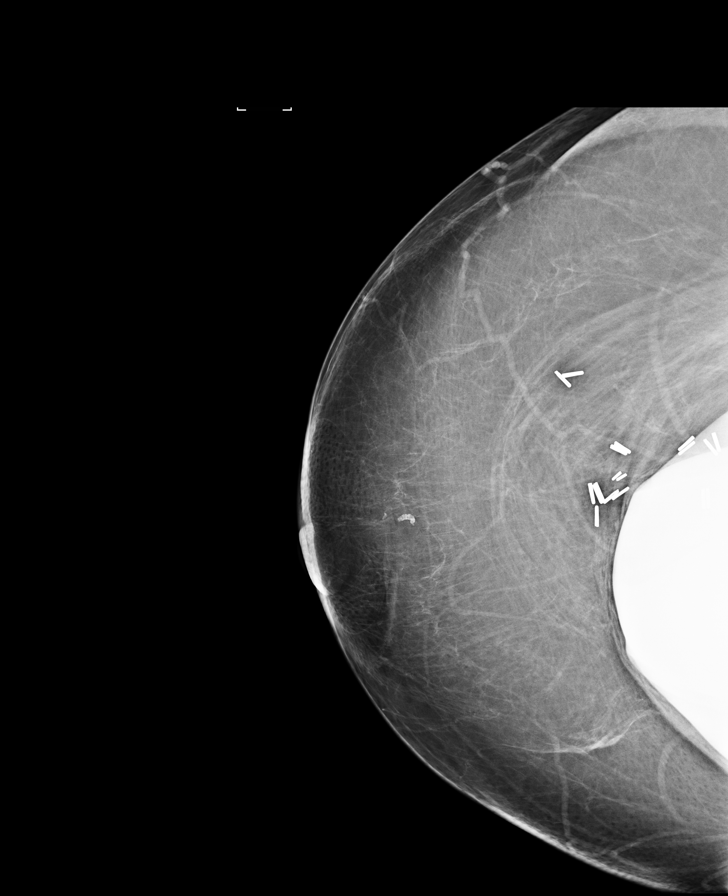

[L CC (1 of 2)]
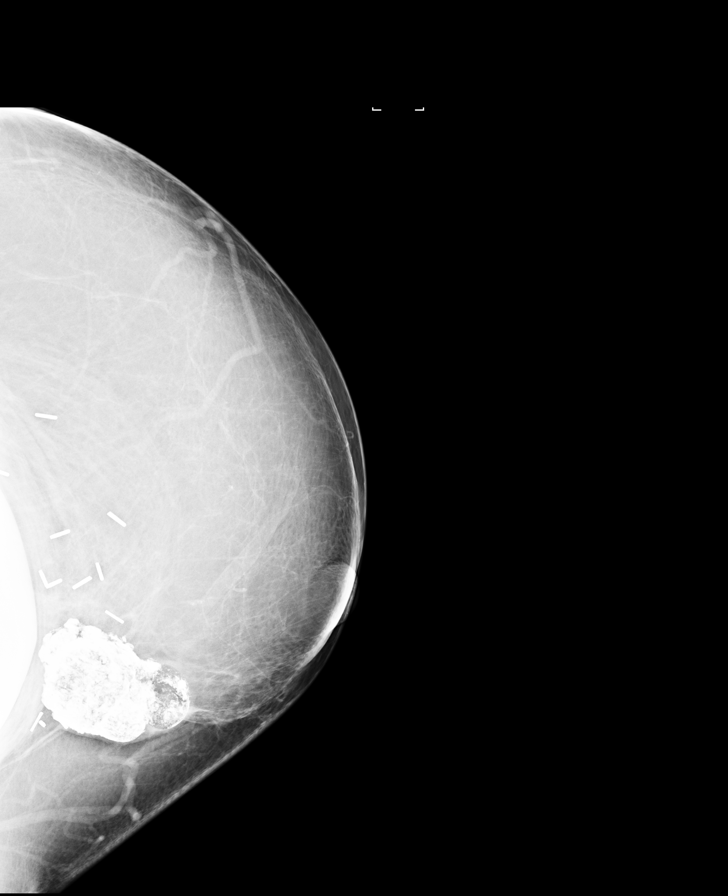

[R MLO]
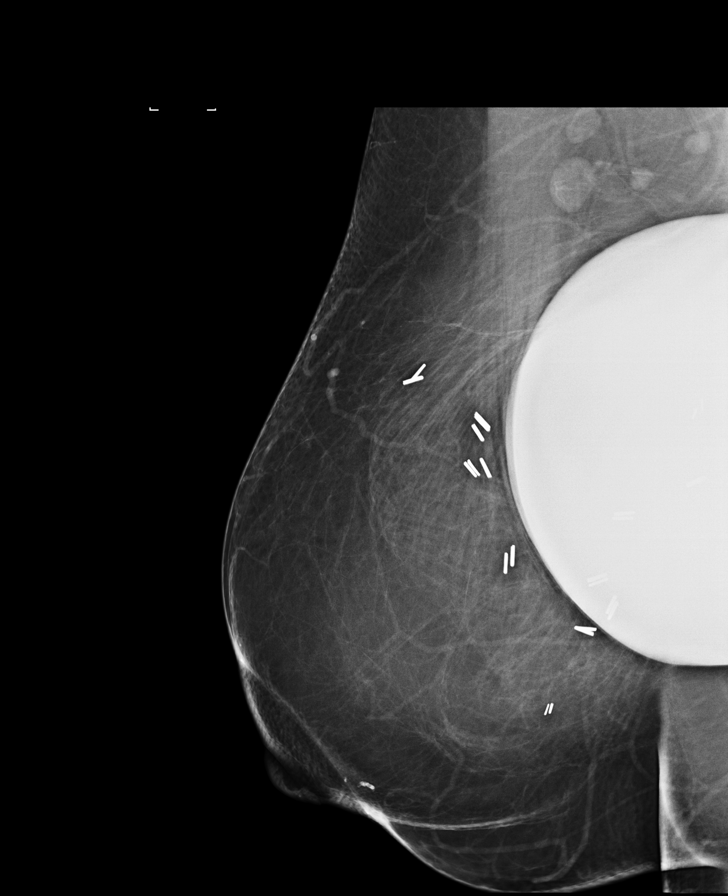

[L MLO]
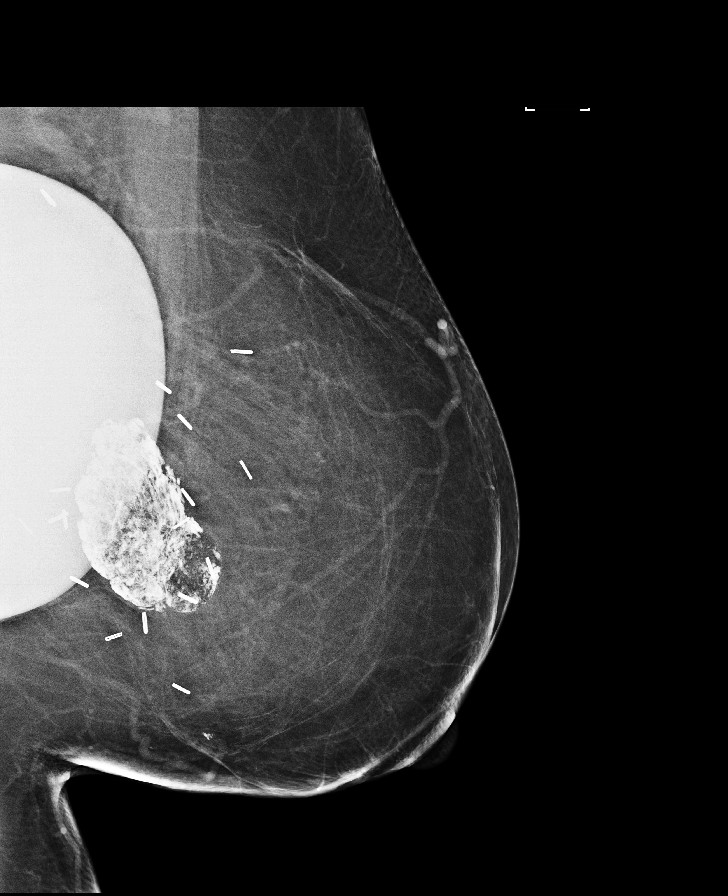

[L CC (2 of 2)]
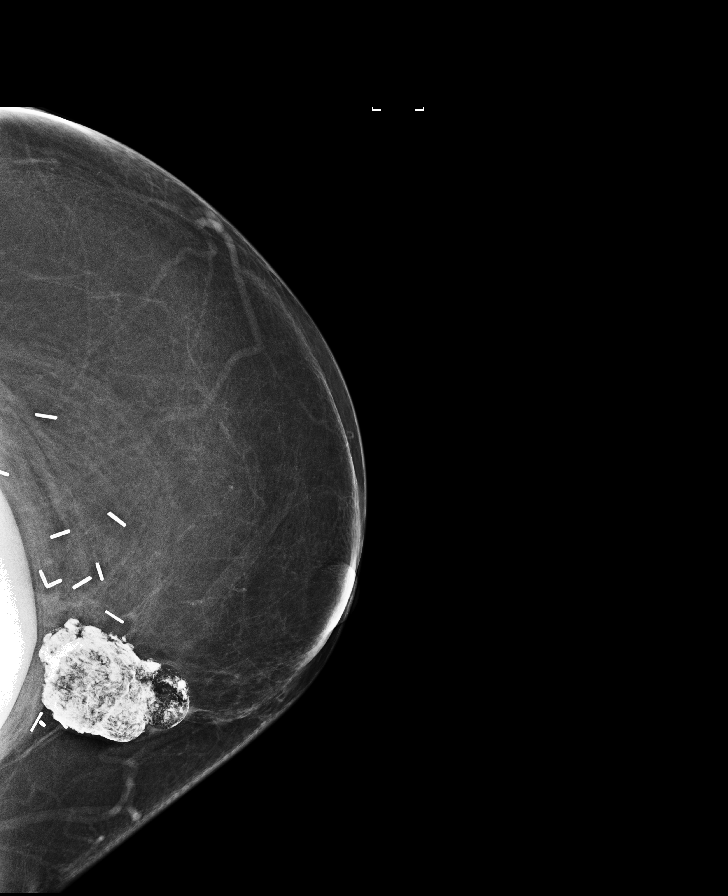

[L MLO synth-2D]
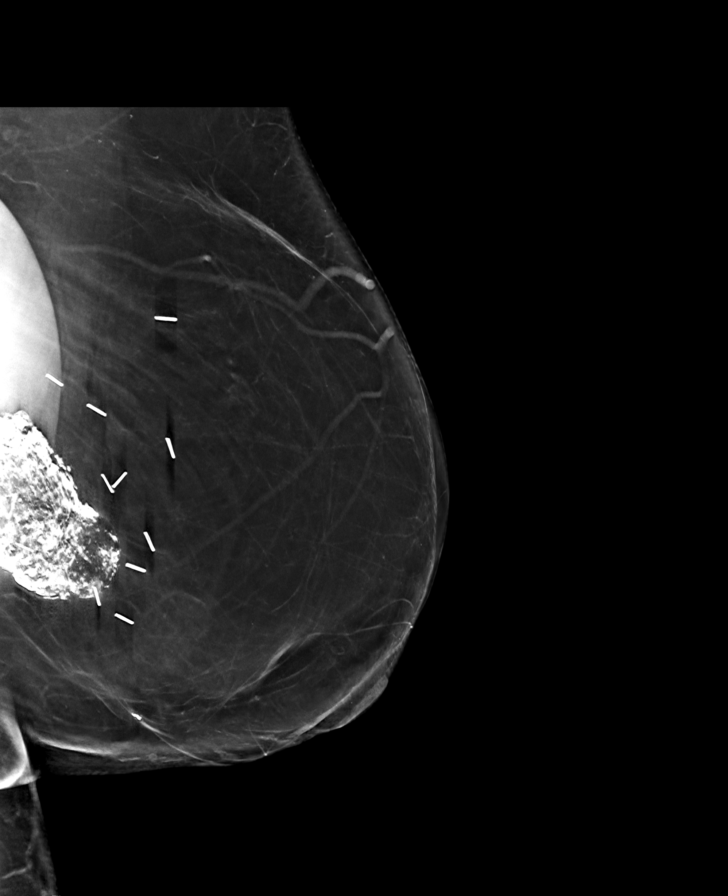

[R MLO synth-2D]
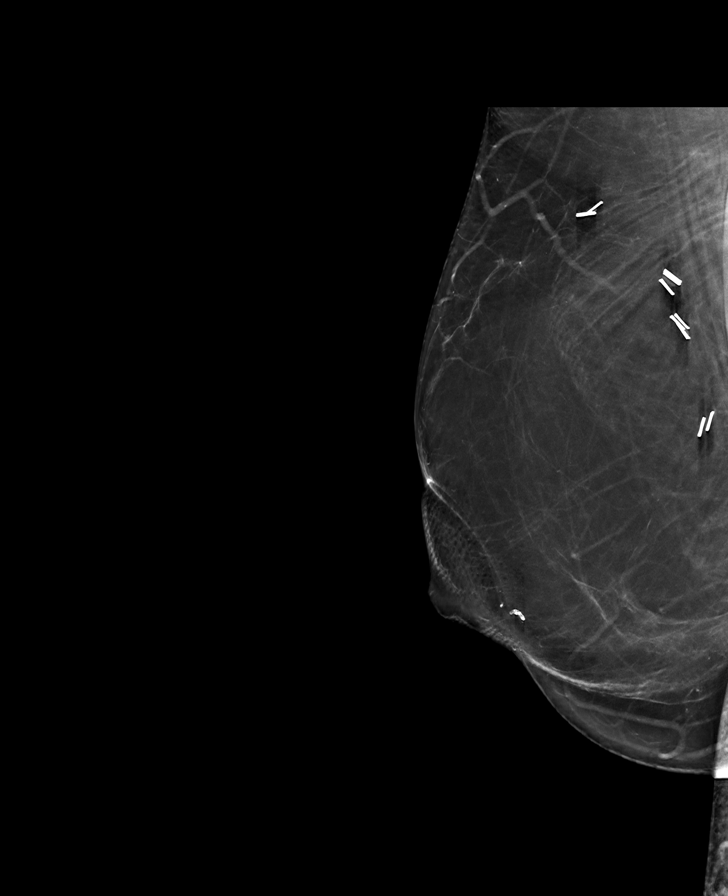

[R CC synth-2D]
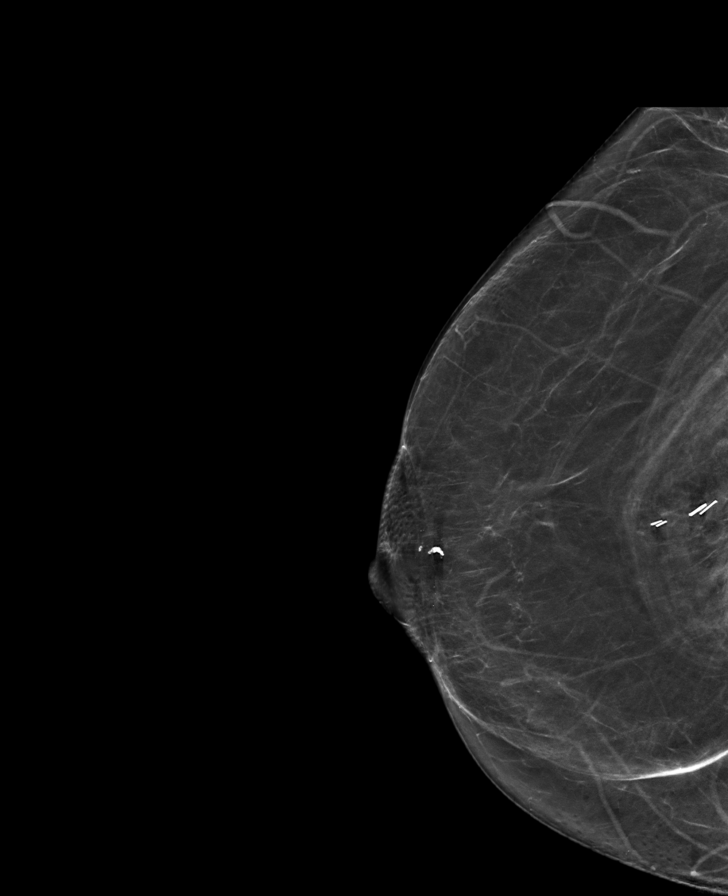

[8 of 29 positions shown; findings below may reference images not displayed]

FINDINGS: There are no findings suspicious for malignancy. Images were
processed with CAD.
IMPRESSION: No mammographic evidence of malignancy. A result letter of this
screening mammogram will be mailed directly to the patient.

RECOMMENDATION:
Screening mammogram in one year. (Code:[id number]-E-JAA)

BI-RADS CATEGORY  1:  Negative.

## 2021-10-04 ENCOUNTER — Other Ambulatory Visit: Payer: Self-pay | Admitting: Physician Assistant

## 2021-10-04 DIAGNOSIS — M4722 Other spondylosis with radiculopathy, cervical region: Secondary | ICD-10-CM

## 2021-10-04 DIAGNOSIS — E119 Type 2 diabetes mellitus without complications: Secondary | ICD-10-CM | POA: Diagnosis not present

## 2021-10-05 ENCOUNTER — Other Ambulatory Visit: Payer: Self-pay | Admitting: Physician Assistant

## 2021-10-10 DIAGNOSIS — G4733 Obstructive sleep apnea (adult) (pediatric): Secondary | ICD-10-CM | POA: Diagnosis not present

## 2021-10-11 ENCOUNTER — Ambulatory Visit: Payer: BC Managed Care – PPO

## 2021-10-15 ENCOUNTER — Inpatient Hospital Stay: Payer: BC Managed Care – PPO | Attending: Hematology & Oncology

## 2021-10-15 VITALS — BP 131/72 | HR 82 | Temp 98.7°F | Resp 18

## 2021-10-15 DIAGNOSIS — C7A00 Malignant carcinoid tumor of unspecified site: Secondary | ICD-10-CM | POA: Diagnosis present

## 2021-10-15 DIAGNOSIS — C7B02 Secondary carcinoid tumors of liver: Secondary | ICD-10-CM | POA: Diagnosis not present

## 2021-10-15 DIAGNOSIS — C7B8 Other secondary neuroendocrine tumors: Secondary | ICD-10-CM

## 2021-10-15 MED ORDER — LANREOTIDE ACETATE 120 MG/0.5ML ~~LOC~~ SOLN
120.0000 mg | Freq: Once | SUBCUTANEOUS | Status: AC
  Administered 2021-10-15: 120 mg via SUBCUTANEOUS
  Filled 2021-10-15: qty 120

## 2021-10-15 NOTE — Patient Instructions (Signed)
Lanreotide Injection What is this medication? LANREOTIDE (lan REE oh tide) treats high levels of growth hormone (acromegaly). It is used when other therapies have not worked well enough or cannot be tolerated. It works by reducing the amount of growth hormone your body makes. This reduces symptoms and the risk of health problems caused by too much growth hormone, such as diabetes and heart disease. It may also be used to treat neuroendocrine tumors, a cancer of the cells that release hormones and other substances in your body. It works by slowing down the release of these substances from the cells. This slows tumor growth. It also decreases the symptoms of carcinoid syndrome, such as flushing or diarrhea. This medicine may be used for other purposes; ask your health care provider or pharmacist if you have questions. COMMON BRAND NAME(S): Somatuline Depot What should I tell my care team before I take this medication? They need to know if you have any of these conditions: Diabetes Gallbladder disease Heart disease Kidney disease Liver disease Thyroid disease An unusual or allergic reaction to lanreotide, other medications, foods, dyes, or preservatives Pregnant or trying to get pregnant Breast-feeding How should I use this medication? This medication is injected under the skin. It is given by your care team in a hospital or clinic setting. Talk to your care team about the use of this medication in children. Special care may be needed. Overdosage: If you think you have taken too much of this medicine contact a poison control center or emergency room at once. NOTE: This medicine is only for you. Do not share this medicine with others. What if I miss a dose? Keep appointments for follow-up doses. It is important not to miss your dose. Call your care team if you are unable to keep an appointment. What may interact with this medication? Bromocriptine Cyclosporine Certain medications for blood  pressure, heart disease, irregular heartbeat Certain medications for diabetes Quinidine Terfenadine This list may not describe all possible interactions. Give your health care provider a list of all the medicines, herbs, non-prescription drugs, or dietary supplements you use. Also tell them if you smoke, drink alcohol, or use illegal drugs. Some items may interact with your medicine. What should I watch for while using this medication? Visit your care team for regular checks on your progress. Tell your care team if your symptoms do not start to get better or if they get worse. Your condition will be monitored carefully while you are receiving this medication. You may need blood work while you are taking this medication. This medication may increase blood sugar. The risk may be higher in patients who already have diabetes. Ask your care team what you can do to lower your risk of diabetes while taking this medication. Talk to your care team if you wish to become pregnant or think you may be pregnant. This medication can cause serious birth defects. Do not breast-feed while taking this medication and for 6 months after stopping therapy. This medication may cause infertility. Talk to your care team if you are concerned about your fertility. What side effects may I notice from receiving this medication? Side effects that you should report to your care team as soon as possible: Allergic reactions--skin rash, itching, hives, swelling of the face, lips, tongue, or throat Gallbladder problems--severe stomach pain, nausea, vomiting, fever High blood sugar (hyperglycemia)--increased thirst or amount of urine, unusual weakness or fatigue, blurry vision Increase in blood pressure Low blood sugar (hypoglycemia)--tremors or shaking, anxiety, sweating, cold   or clammy skin, confusion, dizziness, rapid heartbeat Low thyroid levels (hypothyroidism)--unusual weakness or fatigue, increased sensitivity to cold,  constipation, hair loss, dry skin, weight gain, feelings of depression Slow heartbeat--dizziness, feeling faint or lightheaded, confusion, trouble breathing, unusual weakness or fatigue Side effects that usually do not require medical attention (report to your care team if they continue or are bothersome): Diarrhea Dizziness Headache Muscle spasms Nausea Pain, redness, irritation, or bruising at the injection site Stomach pain This list may not describe all possible side effects. Call your doctor for medical advice about side effects. You may report side effects to FDA at 1-800-FDA-1088. Where should I keep my medication? This medication is given in a hospital or clinic. It will not be stored at home. NOTE: This sheet is a summary. It may not cover all possible information. If you have questions about this medicine, talk to your doctor, pharmacist, or health care provider.  2023 Elsevier/Gold Standard (2021-04-17 00:00:00)  

## 2021-10-17 DIAGNOSIS — G4733 Obstructive sleep apnea (adult) (pediatric): Secondary | ICD-10-CM | POA: Diagnosis not present

## 2021-10-17 DIAGNOSIS — E65 Localized adiposity: Secondary | ICD-10-CM | POA: Diagnosis not present

## 2021-10-17 DIAGNOSIS — E118 Type 2 diabetes mellitus with unspecified complications: Secondary | ICD-10-CM | POA: Diagnosis not present

## 2021-11-04 ENCOUNTER — Encounter: Payer: Self-pay | Admitting: *Deleted

## 2021-11-09 DIAGNOSIS — G4733 Obstructive sleep apnea (adult) (pediatric): Secondary | ICD-10-CM | POA: Diagnosis not present

## 2021-11-12 ENCOUNTER — Encounter: Payer: Self-pay | Admitting: Family

## 2021-11-12 ENCOUNTER — Inpatient Hospital Stay: Payer: BC Managed Care – PPO | Attending: Hematology & Oncology | Admitting: Family

## 2021-11-12 ENCOUNTER — Inpatient Hospital Stay: Payer: BC Managed Care – PPO

## 2021-11-12 VITALS — BP 131/69 | HR 77 | Temp 99.1°F | Resp 17 | Wt 196.1 lb

## 2021-11-12 DIAGNOSIS — C7B8 Other secondary neuroendocrine tumors: Secondary | ICD-10-CM

## 2021-11-12 DIAGNOSIS — C7B02 Secondary carcinoid tumors of liver: Secondary | ICD-10-CM | POA: Diagnosis not present

## 2021-11-12 DIAGNOSIS — C7A00 Malignant carcinoid tumor of unspecified site: Secondary | ICD-10-CM | POA: Insufficient documentation

## 2021-11-12 DIAGNOSIS — R978 Other abnormal tumor markers: Secondary | ICD-10-CM | POA: Insufficient documentation

## 2021-11-12 LAB — CBC WITH DIFFERENTIAL (CANCER CENTER ONLY)
Abs Immature Granulocytes: 0.05 10*3/uL (ref 0.00–0.07)
Basophils Absolute: 0.1 10*3/uL (ref 0.0–0.1)
Basophils Relative: 1 %
Eosinophils Absolute: 0.2 10*3/uL (ref 0.0–0.5)
Eosinophils Relative: 2 %
HCT: 41 % (ref 36.0–46.0)
Hemoglobin: 13 g/dL (ref 12.0–15.0)
Immature Granulocytes: 0 %
Lymphocytes Relative: 26 %
Lymphs Abs: 2.9 10*3/uL (ref 0.7–4.0)
MCH: 28.2 pg (ref 26.0–34.0)
MCHC: 31.7 g/dL (ref 30.0–36.0)
MCV: 88.9 fL (ref 80.0–100.0)
Monocytes Absolute: 0.7 10*3/uL (ref 0.1–1.0)
Monocytes Relative: 6 %
Neutro Abs: 7.3 10*3/uL (ref 1.7–7.7)
Neutrophils Relative %: 65 %
Platelet Count: 330 10*3/uL (ref 150–400)
RBC: 4.61 MIL/uL (ref 3.87–5.11)
RDW: 15 % (ref 11.5–15.5)
WBC Count: 11.1 10*3/uL — ABNORMAL HIGH (ref 4.0–10.5)
nRBC: 0 % (ref 0.0–0.2)

## 2021-11-12 LAB — CMP (CANCER CENTER ONLY)
ALT: 23 U/L (ref 0–44)
AST: 23 U/L (ref 15–41)
Albumin: 3.6 g/dL (ref 3.5–5.0)
Alkaline Phosphatase: 95 U/L (ref 38–126)
Anion gap: 10 (ref 5–15)
BUN: 18 mg/dL (ref 8–23)
CO2: 23 mmol/L (ref 22–32)
Calcium: 9.2 mg/dL (ref 8.9–10.3)
Chloride: 108 mmol/L (ref 98–111)
Creatinine: 0.87 mg/dL (ref 0.44–1.00)
GFR, Estimated: >60 mL/min (ref >60)
Glucose, Bld: 143 mg/dL — ABNORMAL HIGH (ref 70–99)
Potassium: 4.7 mmol/L (ref 3.5–5.1)
Sodium: 141 mmol/L (ref 135–145)
Total Bilirubin: 0.3 mg/dL (ref 0.3–1.2)
Total Protein: 7.9 g/dL (ref 6.5–8.1)

## 2021-11-12 LAB — LACTATE DEHYDROGENASE: LDH: 158 U/L (ref 98–192)

## 2021-11-12 MED ORDER — LANREOTIDE ACETATE 120 MG/0.5ML ~~LOC~~ SOLN
120.0000 mg | Freq: Once | SUBCUTANEOUS | Status: AC
  Administered 2021-11-12: 120 mg via SUBCUTANEOUS
  Filled 2021-11-12: qty 120

## 2021-11-12 NOTE — Patient Instructions (Signed)
Lanreotide Injection What is this medication? LANREOTIDE (lan REE oh tide) treats high levels of growth hormone (acromegaly). It is used when other therapies have not worked well enough or cannot be tolerated. It works by reducing the amount of growth hormone your body makes. This reduces symptoms and the risk of health problems caused by too much growth hormone, such as diabetes and heart disease. It may also be used to treat neuroendocrine tumors, a cancer of the cells that release hormones and other substances in your body. It works by slowing down the release of these substances from the cells. This slows tumor growth. It also decreases the symptoms of carcinoid syndrome, such as flushing or diarrhea. This medicine may be used for other purposes; ask your health care provider or pharmacist if you have questions. COMMON BRAND NAME(S): Somatuline Depot What should I tell my care team before I take this medication? They need to know if you have any of these conditions: Diabetes Gallbladder disease Heart disease Kidney disease Liver disease Thyroid disease An unusual or allergic reaction to lanreotide, other medications, foods, dyes, or preservatives Pregnant or trying to get pregnant Breast-feeding How should I use this medication? This medication is injected under the skin. It is given by your care team in a hospital or clinic setting. Talk to your care team about the use of this medication in children. Special care may be needed. Overdosage: If you think you have taken too much of this medicine contact a poison control center or emergency room at once. NOTE: This medicine is only for you. Do not share this medicine with others. What if I miss a dose? Keep appointments for follow-up doses. It is important not to miss your dose. Call your care team if you are unable to keep an appointment. What may interact with this medication? Bromocriptine Cyclosporine Certain medications for blood  pressure, heart disease, irregular heartbeat Certain medications for diabetes Quinidine Terfenadine This list may not describe all possible interactions. Give your health care provider a list of all the medicines, herbs, non-prescription drugs, or dietary supplements you use. Also tell them if you smoke, drink alcohol, or use illegal drugs. Some items may interact with your medicine. What should I watch for while using this medication? Visit your care team for regular checks on your progress. Tell your care team if your symptoms do not start to get better or if they get worse. Your condition will be monitored carefully while you are receiving this medication. You may need blood work while you are taking this medication. This medication may increase blood sugar. The risk may be higher in patients who already have diabetes. Ask your care team what you can do to lower your risk of diabetes while taking this medication. Talk to your care team if you wish to become pregnant or think you may be pregnant. This medication can cause serious birth defects. Do not breast-feed while taking this medication and for 6 months after stopping therapy. This medication may cause infertility. Talk to your care team if you are concerned about your fertility. What side effects may I notice from receiving this medication? Side effects that you should report to your care team as soon as possible: Allergic reactions--skin rash, itching, hives, swelling of the face, lips, tongue, or throat Gallbladder problems--severe stomach pain, nausea, vomiting, fever High blood sugar (hyperglycemia)--increased thirst or amount of urine, unusual weakness or fatigue, blurry vision Increase in blood pressure Low blood sugar (hypoglycemia)--tremors or shaking, anxiety, sweating, cold   or clammy skin, confusion, dizziness, rapid heartbeat Low thyroid levels (hypothyroidism)--unusual weakness or fatigue, increased sensitivity to cold,  constipation, hair loss, dry skin, weight gain, feelings of depression Slow heartbeat--dizziness, feeling faint or lightheaded, confusion, trouble breathing, unusual weakness or fatigue Side effects that usually do not require medical attention (report to your care team if they continue or are bothersome): Diarrhea Dizziness Headache Muscle spasms Nausea Pain, redness, irritation, or bruising at the injection site Stomach pain This list may not describe all possible side effects. Call your doctor for medical advice about side effects. You may report side effects to FDA at 1-800-FDA-1088. Where should I keep my medication? This medication is given in a hospital or clinic. It will not be stored at home. NOTE: This sheet is a summary. It may not cover all possible information. If you have questions about this medicine, talk to your doctor, pharmacist, or health care provider.  2023 Elsevier/Gold Standard (2021-04-17 00:00:00)  

## 2021-11-12 NOTE — Progress Notes (Signed)
Hematology and Oncology Follow Up Visit  Leah [name] [id number] Jun 22, 1960 61 y.o. 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 She is doing well and has no complaints at this time.  She is enjoying her sweet grandson. She will be having her repeat MRI of the Liver with Dr. Crisoforo Oxford in November.  She has some fatigue at times and takes a break to rest if needed.  No fever, chills, n/v (on nausea patch), cough, rash, dizziness, SOB, chest pain, palpitations, abdominal pain or changes in bowel or bladder habits.  No swelling, tenderness, numbness or tingling in her extremities.  No falls ro syncope.  Appetite and hydration have been good. She is going to the local weight loss clinic and has had great success. She is down 10 lbs since July.   ECOG Performance Status: 1 - Symptomatic but completely ambulatory  Medications:  Allergies as of 11/12/2021   No Known Allergies      Medication List        Accurate as of November 12, 2021 10:10 AM. If you have any questions, ask your nurse or doctor.          amLODipine 10 MG tablet Commonly known as: NORVASC TAKE 1 TABLET BY MOUTH EVERY DAY   aspirin EC 81 MG tablet TAKE 1 TABLET BY MOUTH EVERY DAY *SWALLOW WHOLE*   ciclopirox 8 % solution Commonly known as: PENLAC Apply topically daily.   clonazePAM 0.5 MG tablet Commonly known as: KLONOPIN TAKE 1 TABLET BY MOUTH 2 TIMES DAILY.   fexofenadine 180 MG tablet Commonly known as: ALLEGRA Take 1 tablet (180 mg total) by mouth daily.   gabapentin 300 MG capsule Commonly known as: NEURONTIN TAKE 1 CAPSULE BY MOUTH EVERYDAY AT BEDTIME   ketoconazole 2 % cream Commonly known as: NIZORAL Apply topically daily.   lansoprazole 30 MG  capsule Commonly known as: PREVACID TAKE 1 CAPSULE BY MOUTH DAILY AT 12 NOON.   metFORMIN 500 MG tablet Commonly known as: GLUCOPHAGE Take 1 tablet (500 mg total) by mouth 2 (two) times daily with a meal.   methocarbamol 500 MG tablet Commonly known as: ROBAXIN Take 1 tablet (500 mg total) by mouth every 6 (six) hours as needed for muscle spasms.   metoprolol succinate 100 MG 24 hr tablet Commonly known as: TOPROL-XL TAKE 1 TABLET BY MOUTH EVERY DAY EVERY EVENING WITH OR IMMEDIATELY FOLLOWING A MEAL   montelukast 10 MG tablet Commonly known as: SINGULAIR TAKE 1 TABLET BY MOUTH EVERYDAY AT BEDTIME   ondansetron 4 MG disintegrating tablet Commonly known as: ZOFRAN-ODT TAKE 1 TABLET BY MOUTH EVERY 8 HOURS AS NEEDED *INS MAX 18 TABS PER 21 DAYS*   pantoprazole 40 MG tablet Commonly known as: PROTONIX TAKE 1 TABLET BY MOUTH TWICE A DAY   ProAir HFA 108 (90 Base) MCG/ACT inhaler Generic drug: albuterol INHALE 2 PUFFS INTO THE LUNGS EVERY 6 (SIX) HOURS AS NEEDED. FOR SHORTNESS OF BREATH.   rosuvastatin 10 MG tablet Commonly known as: CRESTOR TAKE 1 TABLET BY MOUTH EVERY DAY   scopolamine 1 MG/3DAYS Commonly known as: TRANSDERM-SCOP Place 1 patch (1.5 mg total) onto the skin every 3 (three) days.   valACYclovir 1000 MG tablet Commonly known as: VALTREX Take 1,000 mg  by mouth 2 (two) times daily.   valsartan 320 MG tablet Commonly known as: DIOVAN TAKE 1 TABLET BY MOUTH EVERY DAY   venlafaxine XR 150 MG 24 hr capsule Commonly known as: EFFEXOR-XR TAKE 1 CAPSULE BY MOUTH EVERY DAY IN THE MORNING        Allergies: No Known Allergies  Past Medical History, Surgical history, Social history, and Family History were reviewed and updated.  Review of Systems: All other 10 point review of systems is negative.   Physical Exam:  weight is 196 lb 1.9 oz (89 kg). Her oral temperature is 99.1 F (37.3 C). Her blood pressure is 131/69 and her pulse is 77. Her respiration is  17 and oxygen saturation is 99%.   Wt Readings from Last 3 Encounters:  11/12/21 196 lb 1.9 oz (89 kg)  09/25/21 200 lb (90.7 kg)  08/19/21 206 lb (93.4 kg)    Ocular: Sclerae unicteric, pupils equal, round and reactive to light Ear-nose-throat: Oropharynx clear, dentition fair Lymphatic: No cervical or supraclavicular adenopathy Lungs no rales or rhonchi, good excursion bilaterally Heart regular rate and rhythm, no murmur appreciated Abd soft, nontender, positive bowel sounds MSK no focal spinal tenderness, no joint edema Neuro: non-focal, well-oriented, appropriate affect Breasts: Deferred   Lab Results  Component Value Date   WBC 11.1 (H) 11/12/2021   HGB 13.0 11/12/2021   HCT 41.0 11/12/2021   MCV 88.9 11/12/2021   PLT 330 11/12/2021   Lab Results  Component Value Date   FERRITIN 382 (H) 10/18/2008   IRON <10 (L) 10/18/2008   TIBC NOT CALC Not calculated due to Iron <10. 10/18/2008   UIBC 156 10/18/2008   IRONPCTSAT NOT CALC Not calculated due to Iron <10. 10/18/2008   Lab Results  Component Value Date   RETICCTPCT 0.9 10/18/2008   RBC 4.61 11/12/2021   No results found for: "KPAFRELGTCHN", "LAMBDASER", "KAPLAMBRATIO" No results found for: "IGGSERUM", "IGA", "IGMSERUM" No results found for: "TOTALPROTELP", "ALBUMINELP", "A1GS", "A2GS", "BETS", "BETA2SER", "GAMS", "MSPIKE", "SPEI"   Chemistry      Component Value Date/Time   NA 141 11/12/2021 0812   NA 140 01/20/2017 1416   NA 141 01/17/2016 1047   K 4.7 11/12/2021 0812   K 3.9 01/20/2017 1416   K 3.2 (L) 01/17/2016 1047   CL 108 11/12/2021 0812   CL 102 01/20/2017 1416   CO2 23 11/12/2021 0812   CO2 26 01/20/2017 1416   CO2 27 01/17/2016 1047   BUN 18 11/12/2021 0812   BUN 15 01/20/2017 1416   BUN 6.6 (L) 01/17/2016 1047   CREATININE 0.87 11/12/2021 0812   CREATININE 1.1 01/20/2017 1416   CREATININE 0.7 01/17/2016 1047      Component Value Date/Time   CALCIUM 9.2 11/12/2021 0812   CALCIUM 9.1  01/20/2017 1416   CALCIUM 8.7 01/17/2016 1047   ALKPHOS 95 11/12/2021 0812   ALKPHOS 84 01/20/2017 1416   ALKPHOS 90 01/17/2016 1047   AST 23 11/12/2021 0812   AST 34 01/17/2016 1047   ALT 23 11/12/2021 0812   ALT 35 01/20/2017 1416   ALT 38 01/17/2016 1047   BILITOT 0.3 11/12/2021 0812   BILITOT 0.42 01/17/2016 1047       Impression and Plan: Ms. Ramnath is a very pleasant 61 yo caucasian female with long standing history of metastatic carcinoid with recurrent liver metastasis.  Chromogranin A pending. Previously she was at 127.  Somatuline given today.  Monthly lab and injection.  Follow-up with MD in  3 months.   Lottie Dawson, NP 10/3/202310:10 AM

## 2021-11-14 DIAGNOSIS — M1711 Unilateral primary osteoarthritis, right knee: Secondary | ICD-10-CM | POA: Diagnosis not present

## 2021-11-14 DIAGNOSIS — Z96652 Presence of left artificial knee joint: Secondary | ICD-10-CM | POA: Diagnosis not present

## 2021-11-14 LAB — CHROMOGRANIN A: Chromogranin A (ng/mL): 161.2 ng/mL — ABNORMAL HIGH (ref 0.0–101.8)

## 2021-11-15 ENCOUNTER — Ambulatory Visit: Payer: BC Managed Care – PPO | Admitting: Family

## 2021-11-15 ENCOUNTER — Ambulatory Visit: Payer: BC Managed Care – PPO

## 2021-11-15 ENCOUNTER — Other Ambulatory Visit: Payer: BC Managed Care – PPO

## 2021-11-17 DIAGNOSIS — M1711 Unilateral primary osteoarthritis, right knee: Secondary | ICD-10-CM | POA: Insufficient documentation

## 2021-11-25 ENCOUNTER — Other Ambulatory Visit: Payer: Self-pay | Admitting: Family Medicine

## 2021-11-25 DIAGNOSIS — F419 Anxiety disorder, unspecified: Secondary | ICD-10-CM

## 2021-12-05 DIAGNOSIS — E1159 Type 2 diabetes mellitus with other circulatory complications: Secondary | ICD-10-CM | POA: Diagnosis not present

## 2021-12-05 DIAGNOSIS — E65 Localized adiposity: Secondary | ICD-10-CM | POA: Diagnosis not present

## 2021-12-07 ENCOUNTER — Other Ambulatory Visit: Payer: Self-pay | Admitting: Physician Assistant

## 2021-12-09 DIAGNOSIS — G4733 Obstructive sleep apnea (adult) (pediatric): Secondary | ICD-10-CM | POA: Diagnosis not present

## 2021-12-12 DIAGNOSIS — G4733 Obstructive sleep apnea (adult) (pediatric): Secondary | ICD-10-CM | POA: Diagnosis not present

## 2021-12-13 ENCOUNTER — Inpatient Hospital Stay: Payer: BC Managed Care – PPO

## 2021-12-13 ENCOUNTER — Inpatient Hospital Stay: Payer: BC Managed Care – PPO | Attending: Hematology & Oncology

## 2021-12-13 VITALS — BP 143/78 | HR 82 | Temp 98.1°F | Resp 19

## 2021-12-13 DIAGNOSIS — C7A00 Malignant carcinoid tumor of unspecified site: Secondary | ICD-10-CM | POA: Diagnosis not present

## 2021-12-13 DIAGNOSIS — C7B8 Other secondary neuroendocrine tumors: Secondary | ICD-10-CM

## 2021-12-13 DIAGNOSIS — C7B02 Secondary carcinoid tumors of liver: Secondary | ICD-10-CM | POA: Insufficient documentation

## 2021-12-13 LAB — CBC WITH DIFFERENTIAL (CANCER CENTER ONLY)
Abs Immature Granulocytes: 0.04 10*3/uL (ref 0.00–0.07)
Basophils Absolute: 0.1 10*3/uL (ref 0.0–0.1)
Basophils Relative: 1 %
Eosinophils Absolute: 0.1 10*3/uL (ref 0.0–0.5)
Eosinophils Relative: 1 %
HCT: 39.2 % (ref 36.0–46.0)
Hemoglobin: 12.8 g/dL (ref 12.0–15.0)
Immature Granulocytes: 0 %
Lymphocytes Relative: 26 %
Lymphs Abs: 2.9 10*3/uL (ref 0.7–4.0)
MCH: 29 pg (ref 26.0–34.0)
MCHC: 32.7 g/dL (ref 30.0–36.0)
MCV: 88.9 fL (ref 80.0–100.0)
Monocytes Absolute: 0.6 10*3/uL (ref 0.1–1.0)
Monocytes Relative: 6 %
Neutro Abs: 7.3 10*3/uL (ref 1.7–7.7)
Neutrophils Relative %: 66 %
Platelet Count: 307 10*3/uL (ref 150–400)
RBC: 4.41 MIL/uL (ref 3.87–5.11)
RDW: 15.3 % (ref 11.5–15.5)
WBC Count: 11.1 10*3/uL — ABNORMAL HIGH (ref 4.0–10.5)
nRBC: 0 % (ref 0.0–0.2)

## 2021-12-13 LAB — CMP (CANCER CENTER ONLY)
ALT: 12 U/L (ref 0–44)
AST: 12 U/L — ABNORMAL LOW (ref 15–41)
Albumin: 4.1 g/dL (ref 3.5–5.0)
Alkaline Phosphatase: 75 U/L (ref 38–126)
Anion gap: 7 (ref 5–15)
BUN: 12 mg/dL (ref 8–23)
CO2: 28 mmol/L (ref 22–32)
Calcium: 9.3 mg/dL (ref 8.9–10.3)
Chloride: 103 mmol/L (ref 98–111)
Creatinine: 0.78 mg/dL (ref 0.44–1.00)
GFR, Estimated: >60 mL/min (ref >60)
Glucose, Bld: 129 mg/dL — ABNORMAL HIGH (ref 70–99)
Potassium: 3.7 mmol/L (ref 3.5–5.1)
Sodium: 138 mmol/L (ref 135–145)
Total Bilirubin: 0.5 mg/dL (ref 0.3–1.2)
Total Protein: 7.6 g/dL (ref 6.5–8.1)

## 2021-12-13 MED ORDER — LANREOTIDE ACETATE 120 MG/0.5ML ~~LOC~~ SOLN
120.0000 mg | Freq: Once | SUBCUTANEOUS | Status: AC
  Administered 2021-12-13: 120 mg via SUBCUTANEOUS
  Filled 2021-12-13: qty 120

## 2021-12-13 NOTE — Patient Instructions (Signed)
Lanreotide Injection What is this medication? LANREOTIDE (lan REE oh tide) treats high levels of growth hormone (acromegaly). It is used when other therapies have not worked well enough or cannot be tolerated. It works by reducing the amount of growth hormone your body makes. This reduces symptoms and the risk of health problems caused by too much growth hormone, such as diabetes and heart disease. It may also be used to treat neuroendocrine tumors, a cancer of the cells that release hormones and other substances in your body. It works by slowing down the release of these substances from the cells. This slows tumor growth. It also decreases the symptoms of carcinoid syndrome, such as flushing or diarrhea. This medicine may be used for other purposes; ask your health care provider or pharmacist if you have questions. COMMON BRAND NAME(S): Somatuline [place] What should I tell my care team before I take this medication? They need to know if you have any of these conditions: Diabetes Gallbladder disease Heart disease Kidney disease Liver disease Thyroid disease An unusual or allergic reaction to lanreotide, other medications, foods, dyes, or preservatives Pregnant or trying to get pregnant Breast-feeding How should I use this medication? This medication is injected under the skin. It is given by your care team in a hospital or clinic setting. Talk to your care team about the use of this medication in children. Special care may be needed. Overdosage: If you think you have taken too much of this medicine contact a poison control center or emergency room at once. NOTE: This medicine is only for you. Do not share this medicine with others. What if I miss a dose? Keep appointments for follow-up doses. It is important not to miss your dose. Call your care team if you are unable to keep an appointment. What may interact with this medication? Bromocriptine Cyclosporine Certain medications for blood  pressure, heart disease, irregular heartbeat Certain medications for diabetes Quinidine Terfenadine This list may not describe all possible interactions. Give your health care provider a list of all the medicines, herbs, non-prescription drugs, or dietary supplements you use. Also tell them if you smoke, drink alcohol, or use illegal drugs. Some items may interact with your medicine. What should I watch for while using this medication? Visit your care team for regular checks on your progress. Tell your care team if your symptoms do not start to get better or if they get worse. Your condition will be monitored carefully while you are receiving this medication. You may need blood work while you are taking this medication. This medication may increase blood sugar. The risk may be higher in patients who already have diabetes. Ask your care team what you can do to lower your risk of diabetes while taking this medication. Talk to your care team if you wish to become pregnant or think you may be pregnant. This medication can cause serious birth defects. Do not breast-feed while taking this medication and for 6 months after stopping therapy. This medication may cause infertility. Talk to your care team if you are concerned about your fertility. What side effects may I notice from receiving this medication? Side effects that you should report to your care team as soon as possible: Allergic reactions--skin rash, itching, hives, swelling of the face, lips, tongue, or throat Gallbladder problems--severe stomach pain, nausea, vomiting, fever High blood sugar (hyperglycemia)--increased thirst or amount of urine, unusual weakness or fatigue, blurry vision Increase in blood pressure Low blood sugar (hypoglycemia)--tremors or shaking, anxiety, sweating, cold   or clammy skin, confusion, dizziness, rapid heartbeat Low thyroid levels (hypothyroidism)--unusual weakness or fatigue, increased sensitivity to cold,  constipation, hair loss, dry skin, weight gain, feelings of depression Slow heartbeat--dizziness, feeling faint or lightheaded, confusion, trouble breathing, unusual weakness or fatigue Side effects that usually do not require medical attention (report to your care team if they continue or are bothersome): Diarrhea Dizziness Headache Muscle spasms Nausea Pain, redness, irritation, or bruising at the injection site Stomach pain This list may not describe all possible side effects. Call your doctor for medical advice about side effects. You may report side effects to FDA at 1-800-FDA-1088. Where should I keep my medication? This medication is given in a hospital or clinic. It will not be stored at home. NOTE: This sheet is a summary. It may not cover all possible information. If you have questions about this medicine, talk to your doctor, pharmacist, or health care provider.  2023 Elsevier/Gold Standard (2021-03-29 00:00:00)  

## 2021-12-18 LAB — 5 HIAA, QUANTITATIVE, URINE, 24 HOUR
5-HIAA, Ur: 6.1 mg/L
5-HIAA,Quant.,24 Hr Urine: 1.5 mg/24 hr (ref 0.0–14.9)
Total Volume: 250

## 2021-12-20 ENCOUNTER — Encounter: Payer: Self-pay | Admitting: *Deleted

## 2021-12-21 ENCOUNTER — Other Ambulatory Visit: Payer: Self-pay | Admitting: Family Medicine

## 2021-12-21 DIAGNOSIS — F419 Anxiety disorder, unspecified: Secondary | ICD-10-CM

## 2021-12-30 ENCOUNTER — Other Ambulatory Visit: Payer: Self-pay | Admitting: Physician Assistant

## 2022-01-06 DIAGNOSIS — Z9889 Other specified postprocedural states: Secondary | ICD-10-CM | POA: Diagnosis not present

## 2022-01-06 DIAGNOSIS — D3A8 Other benign neuroendocrine tumors: Secondary | ICD-10-CM | POA: Diagnosis not present

## 2022-01-08 ENCOUNTER — Other Ambulatory Visit: Payer: Self-pay | Admitting: Physician Assistant

## 2022-01-09 DIAGNOSIS — G4733 Obstructive sleep apnea (adult) (pediatric): Secondary | ICD-10-CM | POA: Diagnosis not present

## 2022-01-12 ENCOUNTER — Encounter: Payer: Self-pay | Admitting: Hematology & Oncology

## 2022-01-13 ENCOUNTER — Inpatient Hospital Stay: Payer: BC Managed Care – PPO | Attending: Hematology & Oncology

## 2022-01-13 ENCOUNTER — Inpatient Hospital Stay: Payer: BC Managed Care – PPO

## 2022-01-13 DIAGNOSIS — C7B02 Secondary carcinoid tumors of liver: Secondary | ICD-10-CM | POA: Insufficient documentation

## 2022-01-13 DIAGNOSIS — C7A00 Malignant carcinoid tumor of unspecified site: Secondary | ICD-10-CM | POA: Insufficient documentation

## 2022-01-15 ENCOUNTER — Encounter: Payer: Self-pay | Admitting: Family

## 2022-01-16 ENCOUNTER — Inpatient Hospital Stay: Payer: BC Managed Care – PPO

## 2022-01-16 VITALS — BP 140/93 | HR 99 | Temp 98.2°F | Resp 18

## 2022-01-16 DIAGNOSIS — C7B02 Secondary carcinoid tumors of liver: Secondary | ICD-10-CM | POA: Diagnosis not present

## 2022-01-16 DIAGNOSIS — C7B8 Other secondary neuroendocrine tumors: Secondary | ICD-10-CM

## 2022-01-16 DIAGNOSIS — C7A00 Malignant carcinoid tumor of unspecified site: Secondary | ICD-10-CM | POA: Diagnosis not present

## 2022-01-16 LAB — CBC WITH DIFFERENTIAL (CANCER CENTER ONLY)
Abs Immature Granulocytes: 0.06 10*3/uL (ref 0.00–0.07)
Basophils Absolute: 0.1 10*3/uL (ref 0.0–0.1)
Basophils Relative: 0 %
Eosinophils Absolute: 0.2 10*3/uL (ref 0.0–0.5)
Eosinophils Relative: 2 %
HCT: 40.5 % (ref 36.0–46.0)
Hemoglobin: 13 g/dL (ref 12.0–15.0)
Immature Granulocytes: 0 %
Lymphocytes Relative: 27 %
Lymphs Abs: 3.6 10*3/uL (ref 0.7–4.0)
MCH: 29.1 pg (ref 26.0–34.0)
MCHC: 32.1 g/dL (ref 30.0–36.0)
MCV: 90.8 fL (ref 80.0–100.0)
Monocytes Absolute: 0.8 10*3/uL (ref 0.1–1.0)
Monocytes Relative: 6 %
Neutro Abs: 8.9 10*3/uL — ABNORMAL HIGH (ref 1.7–7.7)
Neutrophils Relative %: 65 %
Platelet Count: 328 10*3/uL (ref 150–400)
RBC: 4.46 MIL/uL (ref 3.87–5.11)
RDW: 14.1 % (ref 11.5–15.5)
WBC Count: 13.7 10*3/uL — ABNORMAL HIGH (ref 4.0–10.5)
nRBC: 0 % (ref 0.0–0.2)

## 2022-01-16 LAB — CMP (CANCER CENTER ONLY)
ALT: 11 U/L (ref 0–44)
AST: 14 U/L — ABNORMAL LOW (ref 15–41)
Albumin: 4.2 g/dL (ref 3.5–5.0)
Alkaline Phosphatase: 72 U/L (ref 38–126)
Anion gap: 10 (ref 5–15)
BUN: 11 mg/dL (ref 8–23)
CO2: 28 mmol/L (ref 22–32)
Calcium: 10 mg/dL (ref 8.9–10.3)
Chloride: 102 mmol/L (ref 98–111)
Creatinine: 0.77 mg/dL (ref 0.44–1.00)
GFR, Estimated: >60 mL/min (ref >60)
Glucose, Bld: 148 mg/dL — ABNORMAL HIGH (ref 70–99)
Potassium: 5.1 mmol/L (ref 3.5–5.1)
Sodium: 140 mmol/L (ref 135–145)
Total Bilirubin: 0.5 mg/dL (ref 0.3–1.2)
Total Protein: 7.9 g/dL (ref 6.5–8.1)

## 2022-01-16 MED ORDER — LANREOTIDE ACETATE 120 MG/0.5ML ~~LOC~~ SOLN
120.0000 mg | Freq: Once | SUBCUTANEOUS | Status: AC
  Administered 2022-01-16: 120 mg via SUBCUTANEOUS
  Filled 2022-01-16: qty 120

## 2022-01-16 NOTE — Patient Instructions (Signed)
Lanreotide Injection What is this medication? LANREOTIDE (lan REE oh tide) treats high levels of growth hormone (acromegaly). It is used when other therapies have not worked well enough or cannot be tolerated. It works by reducing the amount of growth hormone your body makes. This reduces symptoms and the risk of health problems caused by too much growth hormone, such as diabetes and heart disease. It may also be used to treat neuroendocrine tumors, a cancer of the cells that release hormones and other substances in your body. It works by slowing down the release of these substances from the cells. This slows tumor growth. It also decreases the symptoms of carcinoid syndrome, such as flushing or diarrhea. This medicine may be used for other purposes; ask your health care provider or pharmacist if you have questions. COMMON BRAND NAME(S): Somatuline [place] What should I tell my care team before I take this medication? They need to know if you have any of these conditions: Diabetes Gallbladder disease Heart disease Kidney disease Liver disease Thyroid disease An unusual or allergic reaction to lanreotide, other medications, foods, dyes, or preservatives Pregnant or trying to get pregnant Breast-feeding How should I use this medication? This medication is injected under the skin. It is given by your care team in a hospital or clinic setting. Talk to your care team about the use of this medication in children. Special care may be needed. Overdosage: If you think you have taken too much of this medicine contact a poison control center or emergency room at once. NOTE: This medicine is only for you. Do not share this medicine with others. What if I miss a dose? Keep appointments for follow-up doses. It is important not to miss your dose. Call your care team if you are unable to keep an appointment. What may interact with this medication? Bromocriptine Cyclosporine Certain medications for blood  pressure, heart disease, irregular heartbeat Certain medications for diabetes Quinidine Terfenadine This list may not describe all possible interactions. Give your health care provider a list of all the medicines, herbs, non-prescription drugs, or dietary supplements you use. Also tell them if you smoke, drink alcohol, or use illegal drugs. Some items may interact with your medicine. What should I watch for while using this medication? Visit your care team for regular checks on your progress. Tell your care team if your symptoms do not start to get better or if they get worse. Your condition will be monitored carefully while you are receiving this medication. You may need blood work while you are taking this medication. This medication may increase blood sugar. The risk may be higher in patients who already have diabetes. Ask your care team what you can do to lower your risk of diabetes while taking this medication. Talk to your care team if you wish to become pregnant or think you may be pregnant. This medication can cause serious birth defects. Do not breast-feed while taking this medication and for 6 months after stopping therapy. This medication may cause infertility. Talk to your care team if you are concerned about your fertility. What side effects may I notice from receiving this medication? Side effects that you should report to your care team as soon as possible: Allergic reactions--skin rash, itching, hives, swelling of the face, lips, tongue, or throat Gallbladder problems--severe stomach pain, nausea, vomiting, fever High blood sugar (hyperglycemia)--increased thirst or amount of urine, unusual weakness or fatigue, blurry vision Increase in blood pressure Low blood sugar (hypoglycemia)--tremors or shaking, anxiety, sweating, cold   or clammy skin, confusion, dizziness, rapid heartbeat Low thyroid levels (hypothyroidism)--unusual weakness or fatigue, increased sensitivity to cold,  constipation, hair loss, dry skin, weight gain, feelings of depression Slow heartbeat--dizziness, feeling faint or lightheaded, confusion, trouble breathing, unusual weakness or fatigue Side effects that usually do not require medical attention (report to your care team if they continue or are bothersome): Diarrhea Dizziness Headache Muscle spasms Nausea Pain, redness, irritation, or bruising at the injection site Stomach pain This list may not describe all possible side effects. Call your doctor for medical advice about side effects. You may report side effects to FDA at 1-800-FDA-1088. Where should I keep my medication? This medication is given in a hospital or clinic. It will not be stored at home. NOTE: This sheet is a summary. It may not cover all possible information. If you have questions about this medicine, talk to your doctor, pharmacist, or health care provider.  2023 Elsevier/Gold Standard (2021-03-29 00:00:00)  

## 2022-01-23 ENCOUNTER — Encounter: Payer: Self-pay | Admitting: *Deleted

## 2022-02-08 DIAGNOSIS — G4733 Obstructive sleep apnea (adult) (pediatric): Secondary | ICD-10-CM | POA: Diagnosis not present

## 2022-02-10 ENCOUNTER — Telehealth: Payer: BC Managed Care – PPO | Admitting: Physician Assistant

## 2022-02-10 DIAGNOSIS — J208 Acute bronchitis due to other specified organisms: Secondary | ICD-10-CM | POA: Diagnosis not present

## 2022-02-10 DIAGNOSIS — B9689 Other specified bacterial agents as the cause of diseases classified elsewhere: Secondary | ICD-10-CM | POA: Diagnosis not present

## 2022-02-10 MED ORDER — ALBUTEROL SULFATE HFA 108 (90 BASE) MCG/ACT IN AERS: 2.0000 | INHALATION_SPRAY | Freq: Four times a day (QID) | RESPIRATORY_TRACT | 0 refills | Status: DC | PRN

## 2022-02-10 MED ORDER — BENZONATATE 100 MG PO CAPS: 100.0000 mg | ORAL_CAPSULE | Freq: Three times a day (TID) | ORAL | 0 refills | Status: DC | PRN

## 2022-02-10 MED ORDER — DOXYCYCLINE HYCLATE 100 MG PO TABS: 100.0000 mg | ORAL_TABLET | Freq: Two times a day (BID) | ORAL | 0 refills | Status: DC

## 2022-02-10 NOTE — Progress Notes (Signed)
Virtual Visit Consent   Leah [name], you are scheduled for a virtual visit with a [facility] provider today. Just as with appointments in the office, your consent must be obtained to participate. Your consent will be active for this visit and any virtual visit you may have with one of our providers in the next 365 days. If you have a MyChart account, a copy of this consent can be sent to you electronically.  As this is a virtual visit, video technology does not allow for your provider to perform a traditional examination. This may limit your provider's ability to fully assess your condition. If your provider identifies any concerns that need to be evaluated in person or the need to arrange testing (such as labs, EKG, etc.), we will make arrangements to do so. Although advances in technology are sophisticated, we cannot ensure that it will always work on either your end or our end. If the connection with a video visit is poor, the visit may have to be switched to a telephone visit. With either a video or telephone visit, we are not always able to ensure that we have a secure connection.  By engaging in this virtual visit, you consent to the provision of healthcare and authorize for your insurance to be billed (if applicable) for the services provided during this visit. Depending on your insurance coverage, you may receive a charge related to this service.  I need to obtain your verbal consent now. Are you willing to proceed with your visit today? Leah [name] has provided verbal consent on 02/10/2022 for a virtual visit (video or telephone). Leeanne Rio, [place]  Date: 02/10/2022 6:39 PM  Virtual Visit via Video Note   I, Leeanne Rio, connected with  Leah [name]  ([id number], December 29, 1960) on 02/10/22 at  6:30 PM EST by a video-enabled telemedicine application and verified that I am speaking with the correct person using two identifiers.  Location: Patient: Virtual Visit Location  Patient: Home Provider: Virtual Visit Location Provider: Home Office   I discussed the limitations of evaluation and management by telemedicine and the availability of in person appointments. The patient expressed understanding and agreed to proceed.    History of Present Illness: Leah [name] is a 62 y.o. who identifies as a female who was assigned female at birth, and is being seen today for 2+ weeks. Notes initially thought was a cold but has continued to progress. COVID/Flu testing -- negative. Notes cough was persistent but initially dry. Has now become productive of thick phlegm. Noes some head congestion but denies sinus pressure or pain. Some chest tightness without chest pain. Taken OTC -- Mucinex, cough drops.   HPI: HPI  Problems:  Patient Active Problem List   Diagnosis Date Noted   Primary osteoarthritis of left knee 10/29/2020   At risk for dehydration 07/04/2020   Depression 07/04/2020   Other fatigue 07/07/2019   Hepatic steatosis 01/03/2019   S/P right knee arthroscopy 10/19/2018   Hyperlipidemia associated with type 2 diabetes mellitus ([place]) 09/01/2018   Class 3 severe obesity with serious comorbidity and body mass index (BMI) of 45.0 to 49.9 in adult ([place]) 09/01/2018   OSA on CPAP 05/04/2018   Solitary pulmonary nodule 05/04/2018   History of prophylactic mastectomy of both breasts 11/04/2017   Atrophic vaginitis 04/12/2016   Osteopenia 09/27/2015   Branch retinal vein occlusion of left eye 12/12/2014   Hypertensive retinopathy of both eyes 12/12/2014   Lung nodule seen  on imaging study 05/01/2014   Shortness of breath on exertion    Type 2 diabetes mellitus with other specified complication ([place]) 09/73/5329   Morbid obesity ([place]) 03/01/2013   Carcinoid syndrome ([place]) 02/21/2013   HSV infection 09/14/2012   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, Disp: , Rfl:    clonazePAM (KLONOPIN) 0.5 MG tablet, TAKE 1 TABLET BY MOUTH 2 TIMES DAILY., Disp: 60 tablet, Rfl: 2   fexofenadine (ALLEGRA) 180 MG tablet, Take 1 tablet (180 mg total) by mouth daily., Disp: 90 tablet, Rfl: 3   gabapentin (NEURONTIN) 300 MG capsule, TAKE 1 CAPSULE BY MOUTH EVERYDAY AT BEDTIME, Disp: 90 capsule, Rfl: 0   ketoconazole (NIZORAL) 2 % cream, Apply topically daily., Disp: , Rfl:    lansoprazole (PREVACID) 30 MG capsule, TAKE 1 CAPSULE BY MOUTH DAILY AT 12 NOON., Disp: 30 capsule, Rfl: 0   metFORMIN (GLUCOPHAGE) 500 MG tablet, Take 1 tablet (500 mg total) by mouth 2 (two) times daily with a meal., Disp: 180 tablet, Rfl: 3   methocarbamol (ROBAXIN) 500 MG tablet, Take 1 tablet (500 mg total) by mouth every 6 (six) hours as needed for muscle spasms., Disp: 40 tablet, Rfl: 0   metoprolol succinate (TOPROL-XL) 100 MG 24 hr tablet, TAKE 1 TABLET BY MOUTH EVERY DAY EVERY EVENING WITH OR IMMEDIATELY FOLLOWING A MEAL, Disp: 90 tablet, Rfl: 1   montelukast (SINGULAIR) 10 MG tablet, TAKE 1 TABLET BY MOUTH EVERYDAY AT BEDTIME, Disp: 90 tablet, Rfl: 2   ondansetron (ZOFRAN-ODT) 4 MG disintegrating tablet, TAKE 1 TABLET BY MOUTH EVERY 8 HOURS AS NEEDED *INS MAX 18 TABS PER 21 DAYS*, Disp: 18 tablet, Rfl: 3   pantoprazole (PROTONIX) 40 MG tablet, TAKE 1 TABLET BY MOUTH TWICE A DAY, Disp: 180 tablet, Rfl: 0   rosuvastatin (CRESTOR) 10 MG tablet, TAKE 1 TABLET BY MOUTH EVERY DAY, Disp: 90  tablet, Rfl: 0   scopolamine (TRANSDERM-SCOP) 1 MG/3DAYS, Place 1 patch (1.5 mg total) onto the skin every 3 (three) days., Disp: 10 patch, Rfl: 12   valACYclovir (VALTREX) 1000 MG tablet, Take 1,000 mg by mouth 2 (two) times daily., Disp: , Rfl:    valsartan (DIOVAN) 320 MG tablet, TAKE 1 TABLET BY MOUTH EVERY DAY, Disp: 90 tablet, Rfl: 0   venlafaxine XR (EFFEXOR-XR) 150 MG 24 hr capsule, TAKE 1 CAPSULE BY MOUTH EVERY DAY IN THE MORNING, Disp: 90 capsule, Rfl: 1  Observations/Objective: Patient is well-developed, well-nourished in no acute distress.  Resting comfortably at home.  Head is normocephalic, atraumatic.  No labored breathing. Speech is clear and coherent with logical content.  Patient is alert and oriented at baseline.   Assessment and Plan: 1. Acute bacterial bronchitis  Rx Doxycycline.  Increase fluids.  Rest.  Saline nasal spray.  Probiotic.  Mucinex as directed.  Humidifier in bedroom. Tessalon and albuterol per orders.  Call or return to clinic if  symptoms are not improving.   Follow Up Instructions: I discussed the assessment and treatment plan with the patient. The patient was provided an opportunity to ask questions and all were answered. The patient agreed with the plan and demonstrated an understanding of the instructions.  A copy of instructions were sent to the patient via MyChart unless otherwise noted below.   The patient was advised to call back or seek an in-person evaluation if the symptoms worsen or if the condition fails to improve as anticipated.  Time:  I spent 10 minutes with the patient via telehealth technology discussing the above problems/concerns.    Leeanne Rio, PA-C

## 2022-02-10 NOTE — Patient Instructions (Signed)
Silvano Bilis, thank you for joining Leeanne Rio, PA-C for today's virtual visit.  While this provider is not your primary care provider (PCP), if your PCP is located in our provider database this encounter information will be shared with them immediately following your visit.   [facility] account gives you access to today's visit and all your visits, tests, and labs performed at [facility] " click here if you don't have a [facility] account or go to [email or web address]  Consent: (Patient) Leah [name] provided verbal consent for this virtual visit at the beginning of the encounter.  Current Medications:  Current Outpatient Medications:    amLODipine (NORVASC) 10 MG tablet, TAKE 1 TABLET BY MOUTH EVERY Leah, Disp: 90 tablet, Rfl: 3   aspirin EC 81 MG tablet, TAKE 1 TABLET BY MOUTH EVERY Leah *SWALLOW WHOLE*, Disp: 90 tablet, Rfl: 1   ciclopirox (PENLAC) 8 % solution, Apply topically daily., Disp: , Rfl:    clonazePAM (KLONOPIN) 0.5 MG tablet, TAKE 1 TABLET BY MOUTH 2 TIMES DAILY., Disp: 60 tablet, Rfl: 2   fexofenadine (ALLEGRA) 180 MG tablet, Take 1 tablet (180 mg total) by mouth daily., Disp: 90 tablet, Rfl: 3   gabapentin (NEURONTIN) 300 MG capsule, TAKE 1 CAPSULE BY MOUTH EVERYDAY AT BEDTIME, Disp: 90 capsule, Rfl: 0   ketoconazole (NIZORAL) 2 % cream, Apply topically daily., Disp: , Rfl:    lansoprazole (PREVACID) 30 MG capsule, TAKE 1 CAPSULE BY MOUTH DAILY AT 12 NOON., Disp: 30 capsule, Rfl: 0   metFORMIN (GLUCOPHAGE) 500 MG tablet, Take 1 tablet (500 mg total) by mouth 2 (two) times daily with a meal., Disp: 180 tablet, Rfl: 3   methocarbamol (ROBAXIN) 500 MG tablet, Take 1 tablet (500 mg total) by mouth every 6 (six) hours as needed for muscle spasms., Disp: 40 tablet, Rfl: 0   metoprolol succinate (TOPROL-XL) 100 MG 24 hr tablet, TAKE 1 TABLET BY MOUTH EVERY Leah EVERY EVENING WITH OR IMMEDIATELY FOLLOWING A MEAL, Disp: 90 tablet, Rfl:  1   montelukast (SINGULAIR) 10 MG tablet, TAKE 1 TABLET BY MOUTH EVERYDAY AT BEDTIME, Disp: 90 tablet, Rfl: 2   ondansetron (ZOFRAN-ODT) 4 MG disintegrating tablet, TAKE 1 TABLET BY MOUTH EVERY 8 HOURS AS NEEDED *INS MAX 18 TABS PER 21 DAYS*, Disp: 18 tablet, Rfl: 3   pantoprazole (PROTONIX) 40 MG tablet, TAKE 1 TABLET BY MOUTH TWICE A Leah, Disp: 180 tablet, Rfl: 0   PROAIR HFA 108 (90 Base) MCG/ACT inhaler, INHALE 2 PUFFS INTO THE LUNGS EVERY 6 (SIX) HOURS AS NEEDED. FOR SHORTNESS OF BREATH., Disp: 6.7 Inhaler, Rfl: 4   rosuvastatin (CRESTOR) 10 MG tablet, TAKE 1 TABLET BY MOUTH EVERY Leah, Disp: 90 tablet, Rfl: 0   scopolamine (TRANSDERM-SCOP) 1 MG/3DAYS, Place 1 patch (1.5 mg total) onto the skin every 3 (three) days., Disp: 10 patch, Rfl: 12   valACYclovir (VALTREX) 1000 MG tablet, Take 1,000 mg by mouth 2 (two) times daily., Disp: , Rfl:    valsartan (DIOVAN) 320 MG tablet, TAKE 1 TABLET BY MOUTH EVERY Leah, Disp: 90 tablet, Rfl: 0   venlafaxine XR (EFFEXOR-XR) 150 MG 24 hr capsule, TAKE 1 CAPSULE BY MOUTH EVERY Leah IN THE MORNING, Disp: 90 capsule, Rfl: 1   Medications ordered in this encounter:  No orders of the defined types were placed in this encounter.    *If you need refills on other medications prior to your next appointment, please contact your pharmacy*  Follow-Up: Call back  or seek an in-person evaluation if the symptoms worsen or if the condition fails to improve as anticipated.  [facility] [phone]  Other Instructions Take antibiotic (Doxycycline) as directed.  Increase fluids.  Get plenty of rest. Use Mucinex for congestion. Use the Tessalon and albuterol as directed. Take a daily probiotic (I recommend Align or Culturelle, but even Activia Yogurt may be beneficial).  A humidifier placed in the bedroom may offer some relief for a dry, scratchy throat of nasal irritation.  Read information below on acute bronchitis. Please call or return to clinic if  symptoms are not improving.  Acute Bronchitis Bronchitis is when the airways that extend from the windpipe into the lungs get red, puffy, and painful (inflamed). Bronchitis often causes thick spit (mucus) to develop. This leads to a cough. A cough is the most common symptom of bronchitis. In acute bronchitis, the condition usually begins suddenly and goes away over time (usually in 2 weeks). Smoking, allergies, and asthma can make bronchitis worse. Repeated episodes of bronchitis may cause more lung problems.  HOME CARE Rest. Drink enough fluids to keep your pee (urine) clear or pale yellow (unless you need to limit fluids as told by your doctor). Only take over-the-counter or prescription medicines as told by your doctor. Avoid smoking and secondhand smoke. These can make bronchitis worse. If you are a smoker, think about using nicotine gum or skin patches. Quitting smoking will help your lungs heal faster. Reduce the chance of getting bronchitis again by: Washing your hands often. Avoiding people with cold symptoms. Trying not to touch your hands to your mouth, nose, or eyes. Follow up with your doctor as told.  GET HELP IF: Your symptoms do not improve after 1 week of treatment. Symptoms include: Cough. Fever. Coughing up thick spit. Body aches. Chest congestion. Chills. Shortness of breath. Sore throat.  GET HELP RIGHT AWAY IF:  You have an increased fever. You have chills. You have severe shortness of breath. You have bloody thick spit (sputum). You throw up (vomit) often. You lose too much body fluid (dehydration). You have a severe headache. You faint.  MAKE SURE YOU:  Understand these instructions. Will watch your condition. Will get help right away if you are not doing well or get worse. Document Released: 07/16/2007 Document Revised: 09/29/2012 Document Reviewed: 07/20/2012 [facility] Patient Information 2015 [place], [place]. This information is not intended to  replace advice given to you by your health care provider. Make sure you discuss any questions you have with your health care provider.    If you have been instructed to have an in-person evaluation today at a local Urgent Care facility, please use the link below. It will take you to a list of all of our available [facility], including address, phone number and hours of operation. Please do not delay care.  [facility]  If you or a family member do not have a primary care provider, use the link below to schedule a visit and establish care. When you choose a [facility] primary care physician or advanced practice provider, you gain a long-term partner in health. Find a Primary Care Provider  Learn more about [facility]'s in-office and virtual care options: [facility]

## 2022-02-12 ENCOUNTER — Inpatient Hospital Stay: Payer: BC Managed Care – PPO

## 2022-02-12 ENCOUNTER — Inpatient Hospital Stay: Payer: BC Managed Care – PPO | Attending: Hematology & Oncology

## 2022-02-12 ENCOUNTER — Inpatient Hospital Stay (HOSPITAL_BASED_OUTPATIENT_CLINIC_OR_DEPARTMENT_OTHER): Payer: BC Managed Care – PPO | Admitting: Family

## 2022-02-12 ENCOUNTER — Encounter: Payer: Self-pay | Admitting: Family

## 2022-02-12 VITALS — BP 152/79 | HR 90 | Temp 98.5°F | Resp 17 | Wt 189.4 lb

## 2022-02-12 DIAGNOSIS — C7A00 Malignant carcinoid tumor of unspecified site: Secondary | ICD-10-CM | POA: Insufficient documentation

## 2022-02-12 DIAGNOSIS — C7B02 Secondary carcinoid tumors of liver: Secondary | ICD-10-CM | POA: Diagnosis not present

## 2022-02-12 DIAGNOSIS — R978 Other abnormal tumor markers: Secondary | ICD-10-CM | POA: Diagnosis not present

## 2022-02-12 DIAGNOSIS — C7B8 Other secondary neuroendocrine tumors: Secondary | ICD-10-CM

## 2022-02-12 LAB — CBC WITH DIFFERENTIAL (CANCER CENTER ONLY)
Abs Immature Granulocytes: 0.05 10*3/uL (ref 0.00–0.07)
Basophils Absolute: 0.1 10*3/uL (ref 0.0–0.1)
Basophils Relative: 0 %
Eosinophils Absolute: 0.3 10*3/uL (ref 0.0–0.5)
Eosinophils Relative: 2 %
HCT: 41.5 % (ref 36.0–46.0)
Hemoglobin: 13.1 g/dL (ref 12.0–15.0)
Immature Granulocytes: 0 %
Lymphocytes Relative: 30 %
Lymphs Abs: 3.4 10*3/uL (ref 0.7–4.0)
MCH: 28.7 pg (ref 26.0–34.0)
MCHC: 31.6 g/dL (ref 30.0–36.0)
MCV: 90.8 fL (ref 80.0–100.0)
Monocytes Absolute: 0.8 10*3/uL (ref 0.1–1.0)
Monocytes Relative: 7 %
Neutro Abs: 7 10*3/uL (ref 1.7–7.7)
Neutrophils Relative %: 61 %
Platelet Count: 296 10*3/uL (ref 150–400)
RBC: 4.57 MIL/uL (ref 3.87–5.11)
RDW: 13.8 % (ref 11.5–15.5)
WBC Count: 11.5 10*3/uL — ABNORMAL HIGH (ref 4.0–10.5)
nRBC: 0 % (ref 0.0–0.2)

## 2022-02-12 LAB — LACTATE DEHYDROGENASE: LDH: 143 U/L (ref 98–192)

## 2022-02-12 LAB — CMP (CANCER CENTER ONLY)
ALT: 22 U/L (ref 0–44)
AST: 21 U/L (ref 15–41)
Albumin: 4.2 g/dL (ref 3.5–5.0)
Alkaline Phosphatase: 81 U/L (ref 38–126)
Anion gap: 10 (ref 5–15)
BUN: 14 mg/dL (ref 8–23)
CO2: 27 mmol/L (ref 22–32)
Calcium: 9.6 mg/dL (ref 8.9–10.3)
Chloride: 102 mmol/L (ref 98–111)
Creatinine: 0.8 mg/dL (ref 0.44–1.00)
GFR, Estimated: >60 mL/min (ref >60)
Glucose, Bld: 112 mg/dL — ABNORMAL HIGH (ref 70–99)
Potassium: 4.5 mmol/L (ref 3.5–5.1)
Sodium: 139 mmol/L (ref 135–145)
Total Bilirubin: 0.4 mg/dL (ref 0.3–1.2)
Total Protein: 8.1 g/dL (ref 6.5–8.1)

## 2022-02-12 MED ORDER — LANREOTIDE ACETATE 120 MG/0.5ML ~~LOC~~ SOLN
120.0000 mg | Freq: Once | SUBCUTANEOUS | Status: AC
  Administered 2022-02-12: 120 mg via SUBCUTANEOUS
  Filled 2022-02-12: qty 120

## 2022-02-12 NOTE — Progress Notes (Signed)
Hematology and Oncology Follow Up Visit  Leah [name] [id number] 10-06-1960 62 y.o. 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 She had her repeat MRI of the liver in November and this showed mildly increase in size of the hepatic lesion along the resection margin as well as subcentimeter enhancing lesion along the anterior margin of the liver. They plan to continue to follow along with imaging every 6 months. No intervention with surgery at this time.  She is doing well but still has some fatigue and occasional nausea.  No fever, chills, n/v, cough, rash, dizziness, SOB, chest pain, palpitations, abdominal pain or changes in bowel or bladder habits at this time.  No falls or syncope reported.  No swelling, tenderness, numbness or tingling in her extremities.  Appetite and hydration are good. Weight is stable at 189 lbs.   ECOG Performance Status: 1 - Symptomatic but completely ambulatory  Medications:  Allergies as of 02/12/2022   No Known Allergies      Medication List        Accurate as of February 12, 2022 11:04 AM. If you have any questions, ask your nurse or doctor.          albuterol 108 (90 Base) MCG/ACT inhaler Commonly known as: VENTOLIN HFA Inhale 2 puffs into the lungs every 6 (six) hours as needed for wheezing or shortness of breath.   amLODipine 10 MG tablet Commonly known as: NORVASC TAKE 1 TABLET BY MOUTH EVERY DAY   aspirin EC 81 MG tablet TAKE 1 TABLET BY MOUTH EVERY DAY *SWALLOW WHOLE*   benzonatate 100 MG capsule Commonly known as: TESSALON Take 1 capsule (100 mg total) by mouth 3 (three) times daily as needed for cough.   ciclopirox 8 % solution Commonly known as: PENLAC Apply topically daily.   clonazePAM 0.5 MG  tablet Commonly known as: KLONOPIN TAKE 1 TABLET BY MOUTH 2 TIMES DAILY.   doxycycline 100 MG tablet Commonly known as: VIBRA-TABS Take 1 tablet (100 mg total) by mouth 2 (two) times daily.   fexofenadine 180 MG tablet Commonly known as: ALLEGRA Take 1 tablet (180 mg total) by mouth daily.   gabapentin 300 MG capsule Commonly known as: NEURONTIN TAKE 1 CAPSULE BY MOUTH EVERYDAY AT BEDTIME   ketoconazole 2 % cream Commonly known as: NIZORAL Apply topically daily.   lansoprazole 30 MG capsule Commonly known as: PREVACID TAKE 1 CAPSULE BY MOUTH DAILY AT 12 NOON.   metFORMIN 500 MG tablet Commonly known as: GLUCOPHAGE Take 1 tablet (500 mg total) by mouth 2 (two) times daily with a meal.   methocarbamol 500 MG tablet Commonly known as: ROBAXIN Take 1 tablet (500 mg total) by mouth every 6 (six) hours as needed for muscle spasms.   metoprolol succinate 100 MG 24 hr tablet Commonly known as: TOPROL-XL TAKE 1 TABLET BY MOUTH EVERY DAY EVERY EVENING WITH OR IMMEDIATELY FOLLOWING A MEAL   montelukast 10 MG tablet Commonly known as: SINGULAIR TAKE 1 TABLET BY MOUTH EVERYDAY AT BEDTIME   ondansetron 4 MG disintegrating tablet Commonly known as: ZOFRAN-ODT TAKE 1 TABLET BY MOUTH EVERY 8 HOURS AS NEEDED *INS MAX 18 TABS PER 21 DAYS*   pantoprazole 40 MG tablet Commonly known as: PROTONIX TAKE 1 TABLET BY MOUTH  TWICE A DAY   rosuvastatin 10 MG tablet Commonly known as: CRESTOR TAKE 1 TABLET BY MOUTH EVERY DAY   scopolamine 1 MG/3DAYS Commonly known as: TRANSDERM-SCOP Place 1 patch (1.5 mg total) onto the skin every 3 (three) days.   valACYclovir 1000 MG tablet Commonly known as: VALTREX Take 1,000 mg by mouth 2 (two) times daily.   valsartan 320 MG tablet Commonly known as: DIOVAN TAKE 1 TABLET BY MOUTH EVERY DAY   venlafaxine XR 150 MG 24 hr capsule Commonly known as: EFFEXOR-XR TAKE 1 CAPSULE BY MOUTH EVERY DAY IN THE MORNING        Allergies: No Known  Allergies  Past Medical History, Surgical history, Social history, and Family History were reviewed and updated.  Review of Systems: All other 10 point review of systems is negative.   Physical Exam:  weight is 189 lb 6.4 oz (85.9 kg). Her oral temperature is 98.5 F (36.9 C). Her blood pressure is 152/79 (abnormal) and her pulse is 90. Her respiration is 17 and oxygen saturation is 98%.   Wt Readings from Last 3 Encounters:  02/12/22 189 lb 6.4 oz (85.9 kg)  11/12/21 196 lb 1.9 oz (89 kg)  09/25/21 200 lb (90.7 kg)    Ocular: Sclerae unicteric, pupils equal, round and reactive to light Ear-nose-throat: Oropharynx clear, dentition fair Lymphatic: No cervical or supraclavicular adenopathy Lungs no rales or rhonchi, good excursion bilaterally Heart regular rate and rhythm, no murmur appreciated Abd soft, nontender, positive bowel sounds MSK no focal spinal tenderness, no joint edema Neuro: non-focal, well-oriented, appropriate affect Breasts: Deferred   Lab Results  Component Value Date   WBC 11.5 (H) 02/12/2022   HGB 13.1 02/12/2022   HCT 41.5 02/12/2022   MCV 90.8 02/12/2022   PLT 296 02/12/2022   Lab Results  Component Value Date   FERRITIN 382 (H) 10/18/2008   IRON <10 (L) 10/18/2008   TIBC NOT CALC Not calculated due to Iron <10. 10/18/2008   UIBC 156 10/18/2008   IRONPCTSAT NOT CALC Not calculated due to Iron <10. 10/18/2008   Lab Results  Component Value Date   RETICCTPCT 0.9 10/18/2008   RBC 4.57 02/12/2022   No results found for: "KPAFRELGTCHN", "LAMBDASER", "KAPLAMBRATIO" No results found for: "IGGSERUM", "IGA", "IGMSERUM" No results found for: "TOTALPROTELP", "ALBUMINELP", "A1GS", "A2GS", "BETS", "BETA2SER", "GAMS", "MSPIKE", "SPEI"   Chemistry      Component Value Date/Time   NA 139 02/12/2022 1007   NA 140 01/20/2017 1416   NA 141 01/17/2016 1047   K 4.5 02/12/2022 1007   K 3.9 01/20/2017 1416   K 3.2 (L) 01/17/2016 1047   CL 102 02/12/2022 1007    CL 102 01/20/2017 1416   CO2 27 02/12/2022 1007   CO2 26 01/20/2017 1416   CO2 27 01/17/2016 1047   BUN 14 02/12/2022 1007   BUN 15 01/20/2017 1416   BUN 6.6 (L) 01/17/2016 1047   CREATININE 0.80 02/12/2022 1007   CREATININE 1.1 01/20/2017 1416   CREATININE 0.7 01/17/2016 1047      Component Value Date/Time   CALCIUM 9.6 02/12/2022 1007   CALCIUM 9.1 01/20/2017 1416   CALCIUM 8.7 01/17/2016 1047   ALKPHOS 81 02/12/2022 1007   ALKPHOS 84 01/20/2017 1416   ALKPHOS 90 01/17/2016 1047   AST 21 02/12/2022 1007   AST 34 01/17/2016 1047   ALT 22 02/12/2022 1007   ALT 35 01/20/2017 1416   ALT 38 01/17/2016 1047   BILITOT 0.4 02/12/2022 1007  BILITOT 0.42 01/17/2016 1047       Impression and Plan: Ms. Vanmeter is a very pleasant 62 yo caucasian female with long standing history of metastatic carcinoid with recurrent liver metastasis.  Chromogranin A pending. Previously she was at 127.  Somatuline given today.  Monthly lab and injection.  Follow-up with MD in 3 months.   Lottie Dawson, NP 1/3/202411:04 AM

## 2022-02-12 NOTE — Patient Instructions (Signed)
Lanreotide Injection What is this medication? LANREOTIDE (lan REE oh tide) treats high levels of growth hormone (acromegaly). It is used when other therapies have not worked well enough or cannot be tolerated. It works by reducing the amount of growth hormone your body makes. This reduces symptoms and the risk of health problems caused by too much growth hormone, such as diabetes and heart disease. It may also be used to treat neuroendocrine tumors, a cancer of the cells that release hormones and other substances in your body. It works by slowing down the release of these substances from the cells. This slows tumor growth. It also decreases the symptoms of carcinoid syndrome, such as flushing or diarrhea. This medicine may be used for other purposes; ask your health care provider or pharmacist if you have questions. COMMON BRAND NAME(S): Somatuline [place] What should I tell my care team before I take this medication? They need to know if you have any of these conditions: Diabetes Gallbladder disease Heart disease Kidney disease Liver disease Thyroid disease An unusual or allergic reaction to lanreotide, other medications, foods, dyes, or preservatives Pregnant or trying to get pregnant Breast-feeding How should I use this medication? This medication is injected under the skin. It is given by your care team in a hospital or clinic setting. Talk to your care team about the use of this medication in children. Special care may be needed. Overdosage: If you think you have taken too much of this medicine contact a poison control center or emergency room at once. NOTE: This medicine is only for you. Do not share this medicine with others. What if I miss a dose? Keep appointments for follow-up doses. It is important not to miss your dose. Call your care team if you are unable to keep an appointment. What may interact with this medication? Bromocriptine Cyclosporine Certain medications for blood  pressure, heart disease, irregular heartbeat Certain medications for diabetes Quinidine Terfenadine This list may not describe all possible interactions. Give your health care provider a list of all the medicines, herbs, non-prescription drugs, or dietary supplements you use. Also tell them if you smoke, drink alcohol, or use illegal drugs. Some items may interact with your medicine. What should I watch for while using this medication? Visit your care team for regular checks on your progress. Tell your care team if your symptoms do not start to get better or if they get worse. Your condition will be monitored carefully while you are receiving this medication. You may need blood work while you are taking this medication. This medication may increase blood sugar. The risk may be higher in patients who already have diabetes. Ask your care team what you can do to lower your risk of diabetes while taking this medication. Talk to your care team if you wish to become pregnant or think you may be pregnant. This medication can cause serious birth defects. Do not breast-feed while taking this medication and for 6 months after stopping therapy. This medication may cause infertility. Talk to your care team if you are concerned about your fertility. What side effects may I notice from receiving this medication? Side effects that you should report to your care team as soon as possible: Allergic reactions--skin rash, itching, hives, swelling of the face, lips, tongue, or throat Gallbladder problems--severe stomach pain, nausea, vomiting, fever High blood sugar (hyperglycemia)--increased thirst or amount of urine, unusual weakness or fatigue, blurry vision Increase in blood pressure Low blood sugar (hypoglycemia)--tremors or shaking, anxiety, sweating, cold   or clammy skin, confusion, dizziness, rapid heartbeat Low thyroid levels (hypothyroidism)--unusual weakness or fatigue, increased sensitivity to cold,  constipation, hair loss, dry skin, weight gain, feelings of depression Slow heartbeat--dizziness, feeling faint or lightheaded, confusion, trouble breathing, unusual weakness or fatigue Side effects that usually do not require medical attention (report to your care team if they continue or are bothersome): Diarrhea Dizziness Headache Muscle spasms Nausea Pain, redness, irritation, or bruising at the injection site Stomach pain This list may not describe all possible side effects. Call your doctor for medical advice about side effects. You may report side effects to FDA at 1-800-FDA-1088. Where should I keep my medication? This medication is given in a hospital or clinic. It will not be stored at home. NOTE: This sheet is a summary. It may not cover all possible information. If you have questions about this medicine, talk to your doctor, pharmacist, or health care provider.  2023 Elsevier/Gold Standard (2021-03-29 00:00:00)  

## 2022-02-13 LAB — CHROMOGRANIN A: Chromogranin A (ng/mL): 165.5 ng/mL — ABNORMAL HIGH (ref 0.0–101.8)

## 2022-02-21 ENCOUNTER — Encounter: Payer: Self-pay | Admitting: Physician Assistant

## 2022-03-01 ENCOUNTER — Other Ambulatory Visit: Payer: Self-pay | Admitting: Physician Assistant

## 2022-03-03 ENCOUNTER — Ambulatory Visit (INDEPENDENT_AMBULATORY_CARE_PROVIDER_SITE_OTHER): Payer: BC Managed Care – PPO | Admitting: Sports Medicine

## 2022-03-03 VITALS — HR 83 | Ht 59.0 in | Wt 189.0 lb

## 2022-03-03 DIAGNOSIS — M1711 Unilateral primary osteoarthritis, right knee: Secondary | ICD-10-CM

## 2022-03-03 DIAGNOSIS — M65311 Trigger thumb, right thumb: Secondary | ICD-10-CM

## 2022-03-03 NOTE — Patient Instructions (Signed)
Good to see you   

## 2022-03-03 NOTE — Progress Notes (Signed)
Benito Mccreedy D.[facility] [place] Phone: [phone]   Assessment and Plan:     1. Primary osteoarthritis of right knee -Chronic with exacerbation, subsequent visit - Recurrence of right knee pain consistent with flare of osteoarthritis - Patient is overall received benefit from intra-articular CSI with last CSI to right knee on 09/25/2021 - Patient elected for repeat CSI.  Tolerated well per note below.  Patient's blood glucose may be elevated after CSI with past medical history of DM type II - Patient is planning for total knee replacement in 04/2022  Procedure: Knee Joint Injection Side: Right Indication: Flare of osteoarthritis  Risks explained and consent was given verbally. The site was cleaned with alcohol prep. A needle was introduced with an anterio-lateral approach. Injection given using 62m of 1% lidocaine without epinephrine and 143mof kenalog '40mg'$ /ml. This was well tolerated and resulted in symptomatic relief.  Needle was removed, hemostasis achieved, and post injection instructions were explained.   Pt was advised to call or return to clinic if these symptoms worsen or fail to improve as anticipated.   2. Trigger finger of right thumb  -Chronic with exacerbation, subsequent visit - Recurrence of her right trigger finger and right first digit that overall received significant improvement in symptoms and triggering after CSI on 06/26/2021. Patient's blood glucose may be elevated after CSI with past medical history of DM type II - Patient elects for repeat CSI.  Tolerated well per note below  Procedure: Flexor Tendon Sheath Injection (Trigger Finger) Side: Right 1st digit  Indication: Trigger Finger  After explaining the procedure, viable alternatives, risks, and answering any questions, consent was given verbally.  The site was cleaned with alcohol prep.  A steroid injection was performed under ultrasound  guidance using 0.69m59mf 1% lidocaine without epinephrine and 20 mg of Kenalog 40 with sterile technique.  This was well tolerated and resulted in  relief.  Needle was removed and dressing placed and post injection instructions were given including  a discussion of likely return of pain today after the anesthetic wears off (with the possibility of worsened pain) until the steroid starts to work in 3-5 days.   Pt was advised to call or return to clinic if these symptoms worsen or fail to improve as anticipated.  If not at least 50% better in 6 weeks would consider repeat injection.   Pertinent previous records reviewed include none   Follow Up: As needed if no improvement or worsening of symptoms   Subjective:   I, [name], am serving as a scrEducation administratorr Doctor BenGlennon MacChief Complaint: right knee injection, bilateral thumb pain    HPI:    06/26/21 Patient is a 61 88ar old female complaining of right knee pain and bilateral thumb pain. Patient states that sometimes they wont bend at all its a click up and click down , been chronic hand pain but he clicking and locking has been going on for a couple of weeks, hx of right thumb fx , does get numbness and tingling, thumbs have been waking her up during the night , has been taking tylenol and using Voltaren gel and gabapentin,    09/25/2021 Patient states that a few weeks ago her knee was awful but now it is feeling better , hands are good , wants injection    03/03/2022 Patient states thumb pain is back , right knee is hurting a lot want CSI  today     Relevant Historical Information: History of stage IV liver cancer s/p resection, total left knee replacement, DM type II  Additional pertinent review of systems negative.   Current Outpatient Medications:    albuterol (VENTOLIN HFA) 108 (90 Base) MCG/ACT inhaler, Inhale 2 puffs into the lungs every 6 (six) hours as needed for wheezing or shortness of breath., Disp: 8 g, Rfl: 0    amLODipine (NORVASC) 10 MG tablet, TAKE 1 TABLET BY MOUTH EVERY DAY, Disp: 90 tablet, Rfl: 3   aspirin EC 81 MG tablet, TAKE 1 TABLET BY MOUTH EVERY DAY *SWALLOW WHOLE*, Disp: 90 tablet, Rfl: 1   benzonatate (TESSALON) 100 MG capsule, Take 1 capsule (100 mg total) by mouth 3 (three) times daily as needed for cough., Disp: 30 capsule, Rfl: 0   ciclopirox (PENLAC) 8 % solution, Apply topically daily., Disp: , Rfl:    clonazePAM (KLONOPIN) 0.5 MG tablet, TAKE 1 TABLET BY MOUTH 2 TIMES DAILY., Disp: 60 tablet, Rfl: 2   doxycycline (VIBRA-TABS) 100 MG tablet, Take 1 tablet (100 mg total) by mouth 2 (two) times daily., Disp: 14 tablet, Rfl: 0   fexofenadine (ALLEGRA) 180 MG tablet, Take 1 tablet (180 mg total) by mouth daily., Disp: 90 tablet, Rfl: 3   gabapentin (NEURONTIN) 300 MG capsule, TAKE 1 CAPSULE BY MOUTH EVERYDAY AT BEDTIME, Disp: 90 capsule, Rfl: 0   ketoconazole (NIZORAL) 2 % cream, Apply topically daily., Disp: , Rfl:    lansoprazole (PREVACID) 30 MG capsule, TAKE 1 CAPSULE BY MOUTH DAILY AT 12 NOON., Disp: 30 capsule, Rfl: 0   metFORMIN (GLUCOPHAGE) 500 MG tablet, Take 1 tablet (500 mg total) by mouth 2 (two) times daily with a meal., Disp: 180 tablet, Rfl: 3   methocarbamol (ROBAXIN) 500 MG tablet, Take 1 tablet (500 mg total) by mouth every 6 (six) hours as needed for muscle spasms., Disp: 40 tablet, Rfl: 0   metoprolol succinate (TOPROL-XL) 100 MG 24 hr tablet, TAKE 1 TABLET BY MOUTH EVERY DAY EVERY EVENING WITH OR IMMEDIATELY FOLLOWING A MEAL, Disp: 90 tablet, Rfl: 1   montelukast (SINGULAIR) 10 MG tablet, TAKE 1 TABLET BY MOUTH EVERYDAY AT BEDTIME, Disp: 90 tablet, Rfl: 2   ondansetron (ZOFRAN-ODT) 4 MG disintegrating tablet, TAKE 1 TABLET BY MOUTH EVERY 8 HOURS AS NEEDED *INS MAX 18 TABS PER 21 DAYS*, Disp: 18 tablet, Rfl: 3   pantoprazole (PROTONIX) 40 MG tablet, TAKE 1 TABLET BY MOUTH TWICE A DAY, Disp: 180 tablet, Rfl: 0   rosuvastatin (CRESTOR) 10 MG tablet, TAKE 1 TABLET BY MOUTH  EVERY DAY, Disp: 90 tablet, Rfl: 0   scopolamine (TRANSDERM-SCOP) 1 MG/3DAYS, Place 1 patch (1.5 mg total) onto the skin every 3 (three) days., Disp: 10 patch, Rfl: 12   valACYclovir (VALTREX) 1000 MG tablet, Take 1,000 mg by mouth 2 (two) times daily., Disp: , Rfl:    valsartan (DIOVAN) 320 MG tablet, TAKE 1 TABLET BY MOUTH EVERY DAY, Disp: 90 tablet, Rfl: 0   venlafaxine XR (EFFEXOR-XR) 150 MG 24 hr capsule, TAKE 1 CAPSULE BY MOUTH EVERY DAY IN THE MORNING, Disp: 90 capsule, Rfl: 1   Objective:     Vitals:   03/03/22 1030  Pulse: 83  SpO2: 100%  Weight: 189 lb (85.7 kg)  Height: '4\' 11"'$  (1.499 m)      Body mass index is 38.17 kg/m.    Physical Exam:    General:  awake, alert oriented, no acute distress nontoxic Skin: no suspicious lesions or rashes  Neuro:sensation intact, no deficits, strength 5/5 with no deficits, no atrophy, normal muscle tone Psych: No signs of anxiety, depression or other mood disorder   Right knee: Positive for crepitus No swelling No deformity Neg fluid wave, joint milking ROM Flex 100, Ext 10 TTP medial lateral joint line NTTP over the quad tendon, medial fem condyle, lat fem condyle, patella, plica, patella tendon, tibial tuberostiy, fibular head, posterior fossa, pes anserine bursa, gerdy's tubercle,   Neg anterior and posterior drawer Neg lachman Neg sag sign Negative varus stress Negative valgus stress Negative McMurray   Right hand/wrist:  No deformity or swelling appreciated. Wrist ROM  Ext 90, flexion70, radial/ulnar deviation 30 TTP first MCP with palpable nodule   nttp over the snauff box, dorsal carpals, volar carpals, radial styloid, ulnar styloid,  tfcc Triggering of   thumb  with flexion and extension Negative Tinel's Negative finklestein Neg tfcc bounce test    Electronically signed by:  Benito Mccreedy D.[facility] 10:44 AM 03/03/22

## 2022-03-04 ENCOUNTER — Ambulatory Visit: Payer: BC Managed Care – PPO | Admitting: Sports Medicine

## 2022-03-06 ENCOUNTER — Other Ambulatory Visit: Payer: Self-pay | Admitting: Physician Assistant

## 2022-03-06 DIAGNOSIS — I10 Essential (primary) hypertension: Secondary | ICD-10-CM

## 2022-03-11 ENCOUNTER — Encounter: Payer: Self-pay | Admitting: Physician Assistant

## 2022-03-11 ENCOUNTER — Ambulatory Visit (INDEPENDENT_AMBULATORY_CARE_PROVIDER_SITE_OTHER): Payer: BC Managed Care – PPO | Admitting: Physician Assistant

## 2022-03-11 VITALS — BP 138/80 | HR 83 | Temp 97.0°F | Ht 59.0 in | Wt 186.4 lb

## 2022-03-11 DIAGNOSIS — E782 Mixed hyperlipidemia: Secondary | ICD-10-CM | POA: Diagnosis not present

## 2022-03-11 DIAGNOSIS — I10 Essential (primary) hypertension: Secondary | ICD-10-CM | POA: Diagnosis not present

## 2022-03-11 DIAGNOSIS — E1169 Type 2 diabetes mellitus with other specified complication: Secondary | ICD-10-CM

## 2022-03-11 DIAGNOSIS — M1711 Unilateral primary osteoarthritis, right knee: Secondary | ICD-10-CM

## 2022-03-11 DIAGNOSIS — Z01818 Encounter for other preprocedural examination: Secondary | ICD-10-CM

## 2022-03-11 DIAGNOSIS — Z23 Encounter for immunization: Secondary | ICD-10-CM | POA: Diagnosis not present

## 2022-03-11 DIAGNOSIS — E1159 Type 2 diabetes mellitus with other circulatory complications: Secondary | ICD-10-CM

## 2022-03-11 DIAGNOSIS — E118 Type 2 diabetes mellitus with unspecified complications: Secondary | ICD-10-CM | POA: Diagnosis not present

## 2022-03-11 DIAGNOSIS — I152 Hypertension secondary to endocrine disorders: Secondary | ICD-10-CM

## 2022-03-11 DIAGNOSIS — C7B8 Other secondary neuroendocrine tumors: Secondary | ICD-10-CM

## 2022-03-11 LAB — POCT GLYCOSYLATED HEMOGLOBIN (HGB A1C): HbA1c POC (<> result, manual entry): 5.8 % (ref 4.0–5.6)

## 2022-03-11 NOTE — Progress Notes (Unsigned)
Subjective:    Patient ID: Leah [name], female    DOB: Jun 14, 1960, 62 y.o.   MRN: [id number]  Chief Complaint  Patient presents with   Pre-op Exam    Pt in the office for PreOp exam; pt getting right total knee arthroplasty 04/14/22; so side effects of Mounjaro has subsided; labs recently drawn in chart to be reviewed for for to be completed; pt getting 2nd shingles vaccine today    HPI Patient is in today for pre-op evaluation. Scheduled for right total knee arthroplasty with Dr. Wynelle Link on 04/14/22.    She had same procedure done on left knee last year. No problems with anesthesia.  No personal history of cardiovascular disease. No trouble with chest pain or SOB with exertion or going up and down stairs.   She has been following [facility], doing well losing weight.  She has regular f/up with her oncologist.   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[place])    Constipation    Diabetes ([place])    Dyspnea    Fatty liver    Gallbladder problem    GE reflux    Hepatic encephalopathy ([place]) 2011   History of PCR DNA positive for HSV1    History of retinal vein occlusion, right eye    HTN (hypertension) 2011   Joint pain    Liver problem    Migraines    Neuroendocrine tumor 2011   Orthostatic hypotension    Osteoarthritis    Osteopenia    Palpitations    Sarcoidosis 2008   Sleep apnea    uses cpap   Surgical menopause     Past Surgical History:  Procedure Laterality Date   ABDOMINAL HYSTERECTOMY  2004   AUGMENTATION MAMMAPLASTY     BREAST BIOPSY Left    BREAST RECONSTRUCTION Bilateral 05/07/2020   Procedure: Revision of bilateral breast reconstruction;  Surgeon: Cindra Presume, MD;  Location: [facility];  Service: Plastics;  Laterality: Bilateral;  2 hours   CESAREAN SECTION  [id number]   CHOLECYSTECTOMY  2006    HERNIA REPAIR     KNEE ARTHROSCOPY Left 09/2010   LAPAROSCOPIC HEPATECTOMY     LIPOSUCTION Bilateral 05/07/2020   Procedure: LIPOSUCTION with excision of lateral breast and axillary tissue;  Surgeon: Cindra Presume, MD;  Location: [facility];  Service: Plastics;  Laterality: Bilateral;   LIVER SURGERY     MASTECTOMY Bilateral 2008   OOPHORECTOMY  2004   RECONSTRUCTION BREAST W/ LATISSIMUS DORSI FLAP Bilateral    TONSILLECTOMY  1976   TOTAL KNEE ARTHROPLASTY Left 10/29/2020   Procedure: TOTAL KNEE ARTHROPLASTY;  Surgeon: Gaynelle Arabian, MD;  Location: [facility];  Service: Orthopedics;  Laterality: Left;   TUBAL LIGATION Bilateral 2001    Family History  Problem Relation Age of Onset   Arthritis Maternal Grandmother    Arthritis Maternal Grandfather    Arthritis Paternal Grandmother    Arthritis Paternal Grandfather    Heart failure Paternal Grandfather    Breast cancer Mother 5   Lupus Mother    Heart failure Father    Hypertension Father    Hyperlipidemia Father    Sudden death Father    Obesity Father    Breast cancer Maternal Aunt 26   Ovarian cancer Maternal Aunt 64   Breast  cancer Maternal Aunt 40    Social History   Tobacco Use   Smoking status: Never   Smokeless tobacco: Never   Tobacco comments:    never used tobacco  Vaping Use   Vaping Use: Never used  Substance Use Topics   Alcohol use: No    Alcohol/week: 0.0 standard drinks of alcohol   Drug use: No     No Known Allergies  Review of Systems NEGATIVE UNLESS OTHERWISE INDICATED IN HPI      Objective:     BP 138/80 (BP Location: Right Arm)   Pulse 83   Temp (!) 97 F (36.1 C) (Temporal)   Ht '4\' 11"'$  (1.499 m)   Wt 186 lb 6.4 oz (84.6 kg)   SpO2 100%   BMI 37.65 kg/m   Wt Readings from Last 3 Encounters:  03/11/22 186 lb 6.4 oz (84.6 kg)  03/03/22 189 lb (85.7 kg)  02/12/22 189 lb 6.4 oz (85.9 kg)    BP Readings from Last 3 Encounters:  03/11/22 138/80  02/12/22 (!)  152/79  01/16/22 (!) 140/93     Physical Exam Vitals and nursing note reviewed.  Constitutional:      General: She is not in acute distress.    Appearance: Normal appearance. She is obese. She is not ill-appearing.  HENT:     Head: Normocephalic.     Right Ear: External ear normal.     Left Ear: External ear normal.     Nose: Nose normal.     Mouth/Throat:     Mouth: Mucous membranes are moist.  Eyes:     Extraocular Movements: Extraocular movements intact.     Conjunctiva/sclera: Conjunctivae normal.     Pupils: Pupils are equal, round, and reactive to light.  Cardiovascular:     Rate and Rhythm: Normal rate and regular rhythm.     Pulses: Normal pulses.     Heart sounds: No murmur heard. Pulmonary:     Effort: Pulmonary effort is normal.     Breath sounds: Normal breath sounds.  Musculoskeletal:        General: Normal range of motion.     Cervical back: Normal range of motion.     Right hip: Normal strength.     Left hip: Normal strength.  Skin:    General: Skin is warm.  Neurological:     General: No focal deficit present.     Mental Status: She is alert and oriented to person, place, and time.     Gait: Gait normal.  Psychiatric:        Mood and Affect: Mood normal.        Behavior: Behavior normal.        Assessment & Plan:  Preop examination Assessment & Plan: RCRI score of 0. Low risk.  Assessment forms completed and faxed for patient.   Primary osteoarthritis of right knee Assessment & Plan: RCRI Risk Score of 0 Will be having complete right knee replacement in March Risk assessment form completed in office today and faxed for patient to Dr. Wynelle Link     Need for prophylactic vaccination and inoculation against varicella -     Varicella-zoster vaccine IM  Type 2 diabetes mellitus with other specified complication, without long-term current use of insulin [facility]) Assessment & Plan: Lab Results  Component Value Date   HGBA1C 5.8 03/11/2022    Well-controlled. Doing well with Mounjaro.  Plan is to continue on metformin 500 mg twice daily.  Orders: -  POCT glycosylated hemoglobin (Hb A1C)  Hypertension associated with type 2 diabetes mellitus ([place]) Assessment & Plan: Normotensive.  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) Assessment & Plan: Follows with oncology         Return in about 6 months (around 09/09/2022) for recheck .  This note was prepared with assistance of Systems analyst. Occasional wrong-word or sound-a-like substitutions may have occurred due to the inherent limitations of voice recognition software.      Lashaya Kienitz M Anab Vivar, PA-C

## 2022-03-11 NOTE — Assessment & Plan Note (Signed)
RCRI Risk Score of 0 Will be having complete right knee replacement in March Risk assessment form completed in office today and faxed for patient to Dr. Wynelle Link

## 2022-03-12 ENCOUNTER — Other Ambulatory Visit: Payer: Self-pay | Admitting: Physician Assistant

## 2022-03-12 DIAGNOSIS — J301 Allergic rhinitis due to pollen: Secondary | ICD-10-CM

## 2022-03-12 NOTE — Assessment & Plan Note (Signed)
Follows with oncology

## 2022-03-12 NOTE — Assessment & Plan Note (Signed)
RCRI score of 0. Low risk.  Assessment forms completed and faxed for patient.

## 2022-03-12 NOTE — Assessment & Plan Note (Signed)
Normotensive.  Stable on valsartan 320 mg, Toprol XL 100 mg, amlodipine 10 mg

## 2022-03-12 NOTE — Assessment & Plan Note (Signed)
Lab Results  Component Value Date   HGBA1C 5.8 03/11/2022   Well-controlled. Doing well with Mounjaro.  Plan is to continue on metformin 500 mg twice daily.

## 2022-03-14 ENCOUNTER — Inpatient Hospital Stay: Payer: BC Managed Care – PPO | Attending: Hematology & Oncology

## 2022-03-14 ENCOUNTER — Inpatient Hospital Stay: Payer: BC Managed Care – PPO

## 2022-03-14 VITALS — BP 132/80 | HR 90 | Temp 98.2°F | Resp 18

## 2022-03-14 DIAGNOSIS — C7B8 Other secondary neuroendocrine tumors: Secondary | ICD-10-CM

## 2022-03-14 DIAGNOSIS — C7A00 Malignant carcinoid tumor of unspecified site: Secondary | ICD-10-CM | POA: Insufficient documentation

## 2022-03-14 DIAGNOSIS — C7B02 Secondary carcinoid tumors of liver: Secondary | ICD-10-CM | POA: Insufficient documentation

## 2022-03-14 LAB — CMP (CANCER CENTER ONLY)
ALT: 15 U/L (ref 0–44)
AST: 12 U/L — ABNORMAL LOW (ref 15–41)
Albumin: 4 g/dL (ref 3.5–5.0)
Alkaline Phosphatase: 83 U/L (ref 38–126)
Anion gap: 11 (ref 5–15)
BUN: 12 mg/dL (ref 8–23)
CO2: 26 mmol/L (ref 22–32)
Calcium: 9.9 mg/dL (ref 8.9–10.3)
Chloride: 107 mmol/L (ref 98–111)
Creatinine: 0.82 mg/dL (ref 0.44–1.00)
GFR, Estimated: >60 mL/min (ref >60)
Glucose, Bld: 139 mg/dL — ABNORMAL HIGH (ref 70–99)
Potassium: 5.2 mmol/L — ABNORMAL HIGH (ref 3.5–5.1)
Sodium: 144 mmol/L (ref 135–145)
Total Bilirubin: 0.5 mg/dL (ref 0.3–1.2)
Total Protein: 7.8 g/dL (ref 6.5–8.1)

## 2022-03-14 LAB — CBC WITH DIFFERENTIAL (CANCER CENTER ONLY)
Abs Immature Granulocytes: 0.08 10*3/uL — ABNORMAL HIGH (ref 0.00–0.07)
Basophils Absolute: 0.1 10*3/uL (ref 0.0–0.1)
Basophils Relative: 1 %
Eosinophils Absolute: 0.2 10*3/uL (ref 0.0–0.5)
Eosinophils Relative: 1 %
HCT: 42.2 % (ref 36.0–46.0)
Hemoglobin: 13 g/dL (ref 12.0–15.0)
Immature Granulocytes: 1 %
Lymphocytes Relative: 23 %
Lymphs Abs: 3.1 10*3/uL (ref 0.7–4.0)
MCH: 28.1 pg (ref 26.0–34.0)
MCHC: 30.8 g/dL (ref 30.0–36.0)
MCV: 91.3 fL (ref 80.0–100.0)
Monocytes Absolute: 0.9 10*3/uL (ref 0.1–1.0)
Monocytes Relative: 7 %
Neutro Abs: 9.3 10*3/uL — ABNORMAL HIGH (ref 1.7–7.7)
Neutrophils Relative %: 67 %
Platelet Count: 353 10*3/uL (ref 150–400)
RBC: 4.62 MIL/uL (ref 3.87–5.11)
RDW: 14.4 % (ref 11.5–15.5)
WBC Count: 13.6 10*3/uL — ABNORMAL HIGH (ref 4.0–10.5)
nRBC: 0 % (ref 0.0–0.2)

## 2022-03-14 MED ORDER — LANREOTIDE ACETATE 120 MG/0.5ML ~~LOC~~ SOLN
120.0000 mg | Freq: Once | SUBCUTANEOUS | Status: AC
  Administered 2022-03-14: 120 mg via SUBCUTANEOUS
  Filled 2022-03-14: qty 120

## 2022-03-14 NOTE — Patient Instructions (Signed)
Lanreotide Injection What is this medication? LANREOTIDE (lan REE oh tide) treats high levels of growth hormone (acromegaly). It is used when other therapies have not worked well enough or cannot be tolerated. It works by reducing the amount of growth hormone your body makes. This reduces symptoms and the risk of health problems caused by too much growth hormone, such as diabetes and heart disease. It may also be used to treat neuroendocrine tumors, a cancer of the cells that release hormones and other substances in your body. It works by slowing down the release of these substances from the cells. This slows tumor growth. It also decreases the symptoms of carcinoid syndrome, such as flushing or diarrhea. This medicine may be used for other purposes; ask your health care provider or pharmacist if you have questions. COMMON BRAND NAME(S): Somatuline [place] What should I tell my care team before I take this medication? They need to know if you have any of these conditions: Diabetes Gallbladder disease Heart disease Kidney disease Liver disease Thyroid disease An unusual or allergic reaction to lanreotide, other medications, foods, dyes, or preservatives Pregnant or trying to get pregnant Breast-feeding How should I use this medication? This medication is injected under the skin. It is given by your care team in a hospital or clinic setting. Talk to your care team about the use of this medication in children. Special care may be needed. Overdosage: If you think you have taken too much of this medicine contact a poison control center or emergency room at once. NOTE: This medicine is only for you. Do not share this medicine with others. What if I miss a dose? Keep appointments for follow-up doses. It is important not to miss your dose. Call your care team if you are unable to keep an appointment. What may interact with this medication? Bromocriptine Cyclosporine Certain medications for blood  pressure, heart disease, irregular heartbeat Certain medications for diabetes Quinidine Terfenadine This list may not describe all possible interactions. Give your health care provider a list of all the medicines, herbs, non-prescription drugs, or dietary supplements you use. Also tell them if you smoke, drink alcohol, or use illegal drugs. Some items may interact with your medicine. What should I watch for while using this medication? Visit your care team for regular checks on your progress. Tell your care team if your symptoms do not start to get better or if they get worse. Your condition will be monitored carefully while you are receiving this medication. You may need blood work while you are taking this medication. This medication may increase blood sugar. The risk may be higher in patients who already have diabetes. Ask your care team what you can do to lower your risk of diabetes while taking this medication. Talk to your care team if you wish to become pregnant or think you may be pregnant. This medication can cause serious birth defects. Do not breast-feed while taking this medication and for 6 months after stopping therapy. This medication may cause infertility. Talk to your care team if you are concerned about your fertility. What side effects may I notice from receiving this medication? Side effects that you should report to your care team as soon as possible: Allergic reactions--skin rash, itching, hives, swelling of the face, lips, tongue, or throat Gallbladder problems--severe stomach pain, nausea, vomiting, fever High blood sugar (hyperglycemia)--increased thirst or amount of urine, unusual weakness or fatigue, blurry vision Increase in blood pressure Low blood sugar (hypoglycemia)--tremors or shaking, anxiety, sweating, cold   or clammy skin, confusion, dizziness, rapid heartbeat Low thyroid levels (hypothyroidism)--unusual weakness or fatigue, increased sensitivity to cold,  constipation, hair loss, dry skin, weight gain, feelings of depression Slow heartbeat--dizziness, feeling faint or lightheaded, confusion, trouble breathing, unusual weakness or fatigue Side effects that usually do not require medical attention (report to your care team if they continue or are bothersome): Diarrhea Dizziness Headache Muscle spasms Nausea Pain, redness, irritation, or bruising at the injection site Stomach pain This list may not describe all possible side effects. Call your doctor for medical advice about side effects. You may report side effects to FDA at 1-800-FDA-1088. Where should I keep my medication? This medication is given in a hospital or clinic. It will not be stored at home. NOTE: This sheet is a summary. It may not cover all possible information. If you have questions about this medicine, talk to your doctor, pharmacist, or health care provider.  2023 Elsevier/Gold Standard (2021-03-29 00:00:00)  

## 2022-03-17 ENCOUNTER — Other Ambulatory Visit: Payer: Self-pay | Admitting: Physician Assistant

## 2022-03-17 DIAGNOSIS — M4722 Other spondylosis with radiculopathy, cervical region: Secondary | ICD-10-CM

## 2022-03-18 ENCOUNTER — Encounter: Payer: Self-pay | Admitting: Physician Assistant

## 2022-03-18 NOTE — H&P (Signed)
TOTAL KNEE ADMISSION H&P  Patient is being admitted for right total knee arthroplasty.  Subjective:  Chief Complaint: Right knee pain.  HPI: Leah [name], 62 y.o. female has a history of pain and functional disability in the right knee due to arthritis and has failed non-surgical conservative treatments for greater than 12 weeks to include NSAID's and/or analgesics, corticosteriod injections, weight reduction as appropriate, and activity modification. Onset of symptoms was gradual, starting several years ago with gradually worsening course since that time. The patient noted no past surgery on the right knee.  Patient currently rates pain in the right knee at 8 out of 10 with activity. Patient has night pain, worsening of pain with activity and weight bearing, and pain that interferes with activities of daily living. Patient has evidence of  bone-on-bone arthritis in the medial and patellofemoral compartments  by imaging studies. There is no active infection.  Patient Active Problem List   Diagnosis Date Noted   Primary osteoarthritis of left knee 10/29/2020   At risk for dehydration 07/04/2020   Depression 07/04/2020   Other fatigue 07/07/2019   Hepatic steatosis 01/03/2019   S/P right knee arthroscopy 10/19/2018   Hyperlipidemia associated with type 2 diabetes mellitus ([place]) 09/01/2018   Class 3 severe obesity with serious comorbidity and body mass index (BMI) of 45.0 to 49.9 in adult ([place]) 09/01/2018   OSA on CPAP 05/04/2018   Solitary pulmonary nodule 05/04/2018   History of prophylactic mastectomy of both breasts 11/04/2017   Preop examination 10/20/2017   Atrophic vaginitis 04/12/2016   Osteopenia 09/27/2015   Branch retinal vein occlusion of left eye 12/12/2014   Hypertensive retinopathy of both eyes 12/12/2014   Lung nodule seen on imaging study 05/01/2014   Shortness of breath on exertion    Type 2 diabetes mellitus with other specified complication ([place]) 54/00/8676   Morbid  obesity ([place]) 03/01/2013   Carcinoid syndrome ([place]) 02/21/2013   HSV infection 09/14/2012   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 ([place]) 10/22/2010   Hypertension associated with type 2 diabetes mellitus ([place]) 10/22/2010   GERD (gastroesophageal reflux disease) 10/22/2010   Allergic rhinitis 10/22/2010   Sarcoidosis 10/22/2010   OA (osteoarthritis) of knee 10/22/2010   Anxiety 10/22/2010    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[place])    Constipation    Diabetes ([place])    Dyspnea    Fatty liver    Gallbladder problem    GE reflux    Hepatic encephalopathy ([place]) 2011   History of PCR DNA positive for HSV1    History of retinal vein occlusion, right eye    HTN (hypertension) 2011   Joint pain    Liver problem    Migraines    Neuroendocrine tumor 2011   Orthostatic hypotension    Osteoarthritis    Osteopenia    Palpitations    Sarcoidosis 2008   Sleep apnea    uses cpap   Surgical menopause     Past Surgical History:  Procedure Laterality Date   ABDOMINAL HYSTERECTOMY  2004   AUGMENTATION MAMMAPLASTY     BREAST BIOPSY Left    BREAST RECONSTRUCTION Bilateral 05/07/2020   Procedure: Revision of bilateral breast reconstruction;  Surgeon: Cindra Presume, MD;  Location: [facility];  Service: Plastics;  Laterality: Bilateral;  2 hours  CESAREAN SECTION  4627,0350   CHOLECYSTECTOMY  2006   HERNIA REPAIR     KNEE ARTHROSCOPY Left 09/2010   LAPAROSCOPIC HEPATECTOMY     LIPOSUCTION Bilateral 05/07/2020   Procedure: LIPOSUCTION with excision of lateral breast and axillary tissue;  Surgeon: Cindra Presume, MD;  Location: [facility];  Service: Plastics;  Laterality: Bilateral;   LIVER SURGERY     MASTECTOMY Bilateral 2008   OOPHORECTOMY  2004   RECONSTRUCTION BREAST W/ LATISSIMUS DORSI FLAP Bilateral     TONSILLECTOMY  1976   TOTAL KNEE ARTHROPLASTY Left 10/29/2020   Procedure: TOTAL KNEE ARTHROPLASTY;  Surgeon: Gaynelle Arabian, MD;  Location: [facility];  Service: Orthopedics;  Laterality: Left;   TUBAL LIGATION Bilateral 2001    Prior to Admission medications   Medication Sig Start Date End Date Taking? Authorizing Provider  albuterol (VENTOLIN HFA) 108 (90 Base) MCG/ACT inhaler Inhale 2 puffs into the lungs every 6 (six) hours as needed for wheezing or shortness of breath. 02/10/22   Brunetta Jeans, PA-C  amLODipine (NORVASC) 10 MG tablet TAKE 1 TABLET BY MOUTH EVERY DAY 09/27/21   Allwardt, Randa Evens, PA-C  aspirin EC 81 MG tablet TAKE 1 TABLET BY MOUTH EVERY DAY *SWALLOW WHOLE* 03/06/22   Allwardt, Randa Evens, PA-C  benzonatate (TESSALON) 100 MG capsule Take 1 capsule (100 mg total) by mouth 3 (three) times daily as needed for cough. 02/10/22   Brunetta Jeans, PA-C  ciclopirox [facility]) 8 % solution Apply topically daily. 02/07/21   [provider]  clonazePAM (KLONOPIN) 0.5 MG tablet TAKE 1 TABLET BY MOUTH 2 TIMES DAILY. 05/27/21   Allwardt, Randa Evens, PA-C  doxycycline (VIBRA-TABS) 100 MG tablet Take 1 tablet (100 mg total) by mouth 2 (two) times daily. 02/10/22   Brunetta Jeans, PA-C  fexofenadine (ALLEGRA) 180 MG tablet TAKE 1 TABLET BY MOUTH EVERY DAY 03/12/22   Allwardt, Alyssa M, PA-C  gabapentin (NEURONTIN) 300 MG capsule TAKE 1 CAPSULE BY MOUTH EVERYDAY AT BEDTIME 10/04/21   Allwardt, Alyssa M, PA-C  ketoconazole (NIZORAL) 2 % cream Apply topically daily. 02/02/21   [provider]  lansoprazole (PREVACID) 30 MG capsule TAKE 1 CAPSULE BY MOUTH DAILY AT 12 NOON. 07/17/21   Allwardt, Alyssa M, PA-C  metFORMIN (GLUCOPHAGE) 500 MG tablet Take 1 tablet (500 mg total) by mouth 2 (two) times daily with a meal. 09/11/20   Briscoe Deutscher, DO  methocarbamol (ROBAXIN) 500 MG tablet Take 1 tablet (500 mg total) by mouth every 6 (six) hours as needed for muscle spasms. 10/30/20   Edmisten,  Kristie L, PA  metoprolol succinate (TOPROL-XL) 100 MG 24 hr tablet TAKE 1 TABLET BY MOUTH EVERY DAY EVERY EVENING WITH OR IMMEDIATELY FOLLOWING A MEAL 12/09/21   Allwardt, Alyssa M, PA-C  montelukast (SINGULAIR) 10 MG tablet TAKE 1 TABLET BY MOUTH EVERYDAY AT BEDTIME 08/06/21   Allwardt, Alyssa M, PA-C  ondansetron (ZOFRAN-ODT) 4 MG disintegrating tablet TAKE 1 TABLET BY MOUTH EVERY 8 HOURS AS NEEDED *INS MAX 18 TABS PER 21 DAYS* 10/04/21   Allwardt, Alyssa M, PA-C  pantoprazole (PROTONIX) 40 MG tablet TAKE 1 TABLET BY MOUTH TWICE A DAY 03/18/22   Allwardt, Alyssa M, PA-C  rosuvastatin (CRESTOR) 10 MG tablet TAKE 1 TABLET BY MOUTH EVERY DAY 03/03/22   Allwardt, Alyssa M, PA-C  scopolamine (TRANSDERM-SCOP) 1 MG/3DAYS Place 1 patch (1.5 mg total) onto the skin every 3 (three) days. 03/04/21   Briscoe Deutscher, DO  valACYclovir (VALTREX) 1000  MG tablet Take 1,000 mg by mouth 2 (two) times daily. 08/23/19   [provider]  valsartan (DIOVAN) 320 MG tablet TAKE 1 TABLET BY MOUTH EVERY DAY 01/08/22   Allwardt, Alyssa M, PA-C  venlafaxine XR (EFFEXOR-XR) 150 MG 24 hr capsule TAKE 1 CAPSULE BY MOUTH EVERY DAY IN THE MORNING 12/23/21   Allwardt, Randa Evens, PA-C    No Known Allergies  Social History   Socioeconomic History   Marital status: Married    Spouse name: Vicente Serene    Number of children: 2   Years of education: Not on file   Highest education level: Some college, no degree  Occupational History   Occupation: Stay at [employer]  Tobacco Use   Smoking status: Never   Smokeless tobacco: Never   Tobacco comments:    never used tobacco  Vaping Use   Vaping Use: Never used  Substance and Sexual Activity   Alcohol use: No    Alcohol/week: 0.0 standard drinks of alcohol   Drug use: No   Sexual activity: Yes    Partners: Male    Birth control/protection: Surgical    Comment: hysterectomy  Other Topics Concern   Not on file  Social History Narrative   Married to [place]. 2 sons (2001) Kasandra Knudsen  and Cristie Hem (1999). Has dog. 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   Social Determinants of Health   Financial Resource Strain: Low Risk  (10/21/2017)   Overall Financial Resource Strain (CARDIA)    Difficulty of Paying Living Expenses: Not very hard  Food Insecurity: No Food Insecurity (10/21/2017)   Hunger Vital Sign    Worried About Running Out of Food in the Last Year: Never true    Ran Out of Food in the Last Year: Never true  Transportation Needs: No Transportation Needs (10/21/2017)   PRAPARE - Hydrologist (Medical): No    Lack of Transportation (Non-Medical): No  Physical Activity: Insufficiently Active (10/21/2017)   Exercise Vital Sign    Days of Exercise per Week: 3 days    Minutes of Exercise per Session: 20 min  Stress: No Stress Concern Present (10/21/2017)   [place]    Feeling of Stress : Only a little  Social Connections: Not on file  Intimate Partner Violence: Not At Risk (10/21/2017)   Humiliation, Afraid, Rape, and Kick questionnaire    Fear of Current or Ex-Partner: No    Emotionally Abused: No    Physically Abused: No    Sexually Abused: No    Tobacco Use: Low Risk  (03/11/2022)   Patient History    Smoking Tobacco Use: Never    Smokeless Tobacco Use: Never    Passive Exposure: Not on file   Social History   Substance and Sexual Activity  Alcohol Use No   Alcohol/week: 0.0 standard drinks of alcohol    Family History  Problem Relation Age of Onset   Arthritis Maternal Grandmother    Arthritis Maternal Grandfather    Arthritis Paternal Grandmother    Arthritis Paternal Grandfather    Heart failure Paternal Grandfather    Breast cancer Mother 96   Lupus Mother    Heart failure Father    Hypertension Father    Hyperlipidemia Father    Sudden death Father    Obesity Father    Breast  cancer Maternal Aunt 48   Ovarian  cancer Maternal Aunt 60   Breast cancer Maternal Aunt 40    Review of Systems  Constitutional:  Negative for chills and fever.  HENT: Negative.    Eyes: Negative.   Respiratory:  Negative for cough and shortness of breath.   Cardiovascular:  Negative for chest pain and palpitations.  Gastrointestinal:  Negative for abdominal pain, constipation, diarrhea, nausea and vomiting.  Genitourinary:  Negative for dysuria, frequency and urgency.  Musculoskeletal:  Positive for joint pain.  Skin:  Negative for rash.   Objective:  Physical Exam: Well nourished and well developed.  General: Alert and oriented x3, cooperative and pleasant, no acute distress.  Head: normocephalic, atraumatic, neck supple.  Eyes: EOMI.  Abdomen: non-tender to palpation and soft, normoactive bowel sounds. Musculoskeletal: Right Knee Exam: Slight varus deformity. No effusion present. No swelling present. The range of motion is: 5 to 125 degrees. Moderate crepitus on range of motion of the knee. Positive medial joint line tenderness. No lateral joint line tenderness. The knee is stable.  Calves soft and nontender. Motor function intact in LE. Strength 5/5 LE bilaterally. Neuro: Distal pulses 2+. Sensation to light touch intact in LE.  Vital signs in last 24 hours: BP: ()/()  Arterial Line BP: ()/()   Imaging Review Plain radiographs demonstrate moderate degenerative joint disease of the right knee. The overall alignment is mild varus. The bone quality appears to be adequate for age and reported activity level.  Assessment/Plan:  End stage arthritis, right knee   The patient history, physical examination, clinical judgment of the provider and imaging studies are consistent with end stage degenerative joint disease of the right knee and total knee arthroplasty is deemed medically necessary. The treatment options including medical management, injection therapy arthroscopy  and arthroplasty were discussed at length. The risks and benefits of total knee arthroplasty were presented and reviewed. The risks due to aseptic loosening, infection, stiffness, patella tracking problems, thromboembolic complications and other imponderables were discussed. The patient acknowledged the explanation, agreed to proceed with the plan and consent was signed. Patient is being admitted for inpatient treatment for surgery, pain control, PT, OT, prophylactic antibiotics, VTE prophylaxis, progressive ambulation and ADLs and discharge planning. The patient is planning to be discharged  home .  Patient's anticipated LOS is less than 2 midnights, meeting these requirements: - Younger than 38 - Lives within 1 hour of care - Has a competent adult at home to recover with post-op - NO history of  - Chronic pain requiring opiods  - Diabetes  - Coronary Artery Disease  - Heart failure  - Heart attack  - Stroke  - DVT/VTE  - Cardiac arrhythmia  - Respiratory Failure/COPD  - Renal failure  - Anemia  Therapy Plans: EO Disposition: Home with Son or Husband Planned DVT Prophylaxis: Xarelto 10 mg QD DME Needed: None PCP: Alyssa Allwardt, PA-C (clearance received) TXA: IV Allergies: NKDA Anesthesia Concerns: None BMI: 38 Last HgbA1c: unsure - will recheck with pre-op labs  [facility]  Other: - Neuroendocrine tumor of the liver (limit tylenol) - Has tolerated oxycodone as well as gabapentin in the past  - Patient was instructed on what medications to stop prior to surgery. - Follow-up visit in 2 weeks with Dr. Wynelle Link - Begin physical therapy following surgery - Pre-operative lab work as pre-surgical testing - Prescriptions will be provided in hospital at time of discharge  R. Jaynie Bream, PA-C Orthopedic Surgery [facility] [place]

## 2022-03-31 NOTE — Progress Notes (Signed)
Anesthesia Review:  PCP: Alyssa  Allwardt LOV 03/11/22  Cardiologist : Chest x-ray : EKG : Echo : Stress test: Cardiac Cath :  Activity level:  Sleep Study/ CPAP : Fasting Blood Sugar :      / Checks Blood Sugar -- times a day:   Blood Thinner/ Instructions /Last Dose: ASA / Instructions/ Last Dose :    03/14/22- cbc and cmp  03/11/22- hgba1c- 5.8  DM- type

## 2022-04-01 NOTE — Patient Instructions (Signed)
SURGICAL WAITING ROOM VISITATION  Patients having surgery or a procedure may have no more than 2 support people in the waiting area - these visitors may rotate.    Children under the age of 47 must have an adult with them who is not the patient.  Due to an increase in RSV and influenza rates and associated hospitalizations, children ages 87 and under may not visit patients in [place].  If the patient needs to stay at the hospital during part of their recovery, the visitor guidelines for inpatient rooms apply. Pre-op nurse will coordinate an appropriate time for 1 support person to accompany patient in pre-op.  This support person may not rotate.    Please refer to the [facility] website for the visitor guidelines for Inpatients (after your surgery is over and you are in a regular room).       Your procedure is scheduled on:  04/14/22    Report to [facility] Main Entrance    Report to admitting at   0900AM   Call this number if you have problems the morning of surgery [phone]   Do not eat food :After Midnight.   After Midnight you may have the following liquids until __ 0830____ AM DAY OF SURGERY  Water Non-Citrus Juices (without pulp, NO RED-Apple, White grape, White cranberry) Black Coffee (NO MILK/CREAM OR CREAMERS, sugar ok)  Clear Tea (NO MILK/CREAM OR CREAMERS, sugar ok) regular and decaf                             Plain Jell-O (NO RED)                                           Fruit ices (not with fruit pulp, NO RED)                                     Popsicles (NO RED)                                                               Sports drinks like Gatorade (NO RED)                   The day of surgery:  Drink ONE (1) Pre-Surgery Clear Ensure or G2 at    0830AM ( have completed by )  the morning of surgery. Drink in one sitting. Do not sip.  This drink was given to you during your hospital  pre-op appointment visit. Nothing else to drink  after completing the  Pre-Surgery Clear Ensure or G2.          If you have questions, please contact your surgeon's office.        Oral Hygiene is also important to reduce your risk of infection.                                    Remember - BRUSH YOUR TEETH THE MORNING OF SURGERY  WITH YOUR REGULAR TOOTHPASTE  DENTURES WILL BE REMOVED PRIOR TO SURGERY PLEASE DO NOT APPLY "Poly grip" OR ADHESIVES!!!   Do NOT smoke after Midnight   Take these medicines the morning of surgery with A SIP OF WATER:  inhalers as usual and brinkg amlodpine, clonazepam, allegra, prevacid, protonix effexor   DO NOT TAKE ANY ORAL DIABETIC MEDICATIONS DAY OF YOUR SURGERY  Bring CPAP mask and tubing day of surgery.                              You may not have any metal on your body including hair pins, jewelry, and body piercing             Do not wear make-up, lotions, powders, perfumes/cologne, or deodorant  Do not wear nail polish including gel and S&S, artificial/acrylic nails, or any other type of covering on natural nails including finger and toenails. If you have artificial nails, gel coating, etc. that needs to be removed by a nail salon please have this removed prior to surgery or surgery may need to be canceled/ delayed if the surgeon/ anesthesia feels like they are unable to be safely monitored.   Do not shave  48 hours prior to surgery.               Men may shave face and neck.   Do not bring valuables to the hospital. [place].   Contacts, glasses, dentures or bridgework may not be worn into surgery.   Bring small overnight bag day of surgery.   DO NOT [place]. PHARMACY WILL DISPENSE MEDICATIONS LISTED ON YOUR MEDICATION LIST TO YOU DURING YOUR ADMISSION [place]!    Patients discharged on the day of surgery will not be allowed to drive home.  Someone NEEDS to stay with you for the first 24 hours  after anesthesia.   Special Instructions: Bring a copy of your healthcare power of attorney and living will documents the day of surgery if you haven't scanned them before.              Please read over the following fact sheets you were given: IF [place] [phone]   If you received a COVID test during your pre-op visit  it is requested that you wear a mask when out in public, stay away from anyone that may not be feeling well and notify your surgeon if you develop symptoms. If you test positive for Covid or have been in contact with anyone that has tested positive in the last 10 days please notify you surgeon.    [place] - Preparing for Surgery Before surgery, you can play an important role.  Because skin is not sterile, your skin needs to be as free of germs as possible.  You can reduce the number of germs on your skin by washing with CHG (chlorahexidine gluconate) soap before surgery.  CHG is an antiseptic cleaner which kills germs and bonds with the skin to continue killing germs even after washing. Please DO NOT use if you have an allergy to CHG or antibacterial soaps.  If your skin becomes reddened/irritated stop using the CHG and inform your nurse when you arrive at Short Stay. Do not shave (including legs and underarms)  for at least 48 hours prior to the first CHG shower.  You may shave your face/neck. Please follow these instructions carefully:  1.  Shower with CHG Soap the night before surgery and the  morning of Surgery.  2.  If you choose to wash your hair, wash your hair first as usual with your  normal  shampoo.  3.  After you shampoo, rinse your hair and body thoroughly to remove the  shampoo.                           4.  Use CHG as you would any other liquid soap.  You can apply chg directly  to the skin and wash                       Gently with a scrungie or clean washcloth.  5.  Apply the CHG Soap to your body ONLY  FROM THE NECK DOWN.   Do not use on face/ open                           Wound or open sores. Avoid contact with eyes, ears mouth and genitals (private parts).                       Wash face,  Genitals (private parts) with your normal soap.             6.  Wash thoroughly, paying special attention to the area where your surgery  will be performed.  7.  Thoroughly rinse your body with warm water from the neck down.  8.  DO NOT shower/wash with your normal soap after using and rinsing off  the CHG Soap.                9.  Pat yourself dry with a clean towel.            10.  Wear clean pajamas.            11.  Place clean sheets on your bed the night of your first shower and do not  sleep with pets. Day of Surgery : Do not apply any lotions/deodorants the morning of surgery.  Please wear clean clothes to the hospital/surgery center.  FAILURE TO FOLLOW THESE INSTRUCTIONS MAY RESULT IN THE CANCELLATION OF YOUR SURGERY PATIENT SIGNATURE_________________________________  NURSE SIGNATURE__________________________________  ________________________________________________________________________

## 2022-04-02 ENCOUNTER — Encounter (HOSPITAL_COMMUNITY)
Admission: RE | Admit: 2022-04-02 | Discharge: 2022-04-02 | Disposition: A | Discharge status: Home / Self Care | Payer: BC Managed Care – PPO | Source: Ambulatory Visit | Attending: Orthopedic Surgery | Admitting: Orthopedic Surgery

## 2022-04-02 ENCOUNTER — Other Ambulatory Visit: Payer: Self-pay

## 2022-04-02 ENCOUNTER — Encounter (HOSPITAL_COMMUNITY): Payer: Self-pay

## 2022-04-02 VITALS — BP 137/88 | HR 89 | Temp 98.4°F | Resp 16 | Ht 58.5 in | Wt 180.0 lb

## 2022-04-02 DIAGNOSIS — Z01818 Encounter for other preprocedural examination: Secondary | ICD-10-CM | POA: Insufficient documentation

## 2022-04-02 DIAGNOSIS — E119 Type 2 diabetes mellitus without complications: Secondary | ICD-10-CM | POA: Diagnosis not present

## 2022-04-02 LAB — BASIC METABOLIC PANEL
Anion gap: 12 (ref 5–15)
BUN: 14 mg/dL (ref 8–23)
CO2: 23 mmol/L (ref 22–32)
Calcium: 9.6 mg/dL (ref 8.9–10.3)
Chloride: 104 mmol/L (ref 98–111)
Creatinine, Ser: 0.97 mg/dL (ref 0.44–1.00)
GFR, Estimated: >60 mL/min (ref >60)
Glucose, Bld: 126 mg/dL — ABNORMAL HIGH (ref 70–99)
Potassium: 4.9 mmol/L (ref 3.5–5.1)
Sodium: 139 mmol/L (ref 135–145)

## 2022-04-02 LAB — CBC
HCT: 45.4 % (ref 36.0–46.0)
Hemoglobin: 14.3 g/dL (ref 12.0–15.0)
MCH: 28.6 pg (ref 26.0–34.0)
MCHC: 31.5 g/dL (ref 30.0–36.0)
MCV: 90.8 fL (ref 80.0–100.0)
Platelets: 410 10*3/uL — ABNORMAL HIGH (ref 150–400)
RBC: 5 MIL/uL (ref 3.87–5.11)
RDW: 14.9 % (ref 11.5–15.5)
WBC: 13.6 10*3/uL — ABNORMAL HIGH (ref 4.0–10.5)
nRBC: 0 % (ref 0.0–0.2)

## 2022-04-02 LAB — HEMOGLOBIN A1C
Hgb A1c MFr Bld: 5.4 % (ref 4.8–5.6)
Mean Plasma Glucose: 108.28 mg/dL

## 2022-04-02 LAB — SURGICAL PCR SCREEN
MRSA, PCR: NEGATIVE
Staphylococcus aureus: NEGATIVE

## 2022-04-02 LAB — GLUCOSE, CAPILLARY: Glucose-Capillary: 124 mg/dL — ABNORMAL HIGH (ref 70–99)

## 2022-04-06 ENCOUNTER — Other Ambulatory Visit: Payer: Self-pay | Admitting: Physician Assistant

## 2022-04-11 ENCOUNTER — Inpatient Hospital Stay: Payer: BC Managed Care – PPO

## 2022-04-11 ENCOUNTER — Inpatient Hospital Stay: Payer: BC Managed Care – PPO | Attending: Hematology & Oncology

## 2022-04-11 VITALS — BP 142/76 | HR 85 | Temp 98.7°F | Resp 18

## 2022-04-11 DIAGNOSIS — C7B02 Secondary carcinoid tumors of liver: Secondary | ICD-10-CM | POA: Insufficient documentation

## 2022-04-11 DIAGNOSIS — C7B8 Other secondary neuroendocrine tumors: Secondary | ICD-10-CM

## 2022-04-11 DIAGNOSIS — C7A00 Malignant carcinoid tumor of unspecified site: Secondary | ICD-10-CM | POA: Insufficient documentation

## 2022-04-11 LAB — CBC WITH DIFFERENTIAL (CANCER CENTER ONLY)
Abs Immature Granulocytes: 0.05 10*3/uL (ref 0.00–0.07)
Basophils Absolute: 0.1 10*3/uL (ref 0.0–0.1)
Basophils Relative: 1 %
Eosinophils Absolute: 0.2 10*3/uL (ref 0.0–0.5)
Eosinophils Relative: 1 %
HCT: 42 % (ref 36.0–46.0)
Hemoglobin: 13.6 g/dL (ref 12.0–15.0)
Immature Granulocytes: 0 %
Lymphocytes Relative: 27 %
Lymphs Abs: 3.2 10*3/uL (ref 0.7–4.0)
MCH: 28.9 pg (ref 26.0–34.0)
MCHC: 32.4 g/dL (ref 30.0–36.0)
MCV: 89.4 fL (ref 80.0–100.0)
Monocytes Absolute: 0.8 10*3/uL (ref 0.1–1.0)
Monocytes Relative: 7 %
Neutro Abs: 7.7 10*3/uL (ref 1.7–7.7)
Neutrophils Relative %: 64 %
Platelet Count: 330 10*3/uL (ref 150–400)
RBC: 4.7 MIL/uL (ref 3.87–5.11)
RDW: 14.4 % (ref 11.5–15.5)
WBC Count: 12 10*3/uL — ABNORMAL HIGH (ref 4.0–10.5)
nRBC: 0 % (ref 0.0–0.2)

## 2022-04-11 LAB — CMP (CANCER CENTER ONLY)
ALT: 12 U/L (ref 0–44)
AST: 13 U/L — ABNORMAL LOW (ref 15–41)
Albumin: 4.4 g/dL (ref 3.5–5.0)
Alkaline Phosphatase: 82 U/L (ref 38–126)
Anion gap: 11 (ref 5–15)
BUN: 15 mg/dL (ref 8–23)
CO2: 26 mmol/L (ref 22–32)
Calcium: 10 mg/dL (ref 8.9–10.3)
Chloride: 103 mmol/L (ref 98–111)
Creatinine: 0.86 mg/dL (ref 0.44–1.00)
GFR, Estimated: >60 mL/min (ref >60)
Glucose, Bld: 128 mg/dL — ABNORMAL HIGH (ref 70–99)
Potassium: 5.1 mmol/L (ref 3.5–5.1)
Sodium: 140 mmol/L (ref 135–145)
Total Bilirubin: 0.5 mg/dL (ref 0.3–1.2)
Total Protein: 7.7 g/dL (ref 6.5–8.1)

## 2022-04-11 MED ORDER — LANREOTIDE ACETATE 120 MG/0.5ML ~~LOC~~ SOLN
120.0000 mg | Freq: Once | SUBCUTANEOUS | Status: AC
  Administered 2022-04-11: 120 mg via SUBCUTANEOUS
  Filled 2022-04-11: qty 120

## 2022-04-11 NOTE — Patient Instructions (Signed)
Lanreotide Injection What is this medication? LANREOTIDE (lan REE oh tide) treats high levels of growth hormone (acromegaly). It is used when other therapies have not worked well enough or cannot be tolerated. It works by reducing the amount of growth hormone your body makes. This reduces symptoms and the risk of health problems caused by too much growth hormone, such as diabetes and heart disease. It may also be used to treat neuroendocrine tumors, a cancer of the cells that release hormones and other substances in your body. It works by slowing down the release of these substances from the cells. This slows tumor growth. It also decreases the symptoms of carcinoid syndrome, such as flushing or diarrhea. This medicine may be used for other purposes; ask your health care provider or pharmacist if you have questions. COMMON BRAND NAME(S): Somatuline [place] What should I tell my care team before I take this medication? They need to know if you have any of these conditions: Diabetes Gallbladder disease Heart disease Kidney disease Liver disease Thyroid disease An unusual or allergic reaction to lanreotide, other medications, foods, dyes, or preservatives Pregnant or trying to get pregnant Breast-feeding How should I use this medication? This medication is injected under the skin. It is given by your care team in a hospital or clinic setting. Talk to your care team about the use of this medication in children. Special care may be needed. Overdosage: If you think you have taken too much of this medicine contact a poison control center or emergency room at once. NOTE: This medicine is only for you. Do not share this medicine with others. What if I miss a dose? Keep appointments for follow-up doses. It is important not to miss your dose. Call your care team if you are unable to keep an appointment. What may interact with this medication? Bromocriptine Cyclosporine Certain medications for blood  pressure, heart disease, irregular heartbeat Certain medications for diabetes Quinidine Terfenadine This list may not describe all possible interactions. Give your health care provider a list of all the medicines, herbs, non-prescription drugs, or dietary supplements you use. Also tell them if you smoke, drink alcohol, or use illegal drugs. Some items may interact with your medicine. What should I watch for while using this medication? Visit your care team for regular checks on your progress. Tell your care team if your symptoms do not start to get better or if they get worse. Your condition will be monitored carefully while you are receiving this medication. You may need blood work while you are taking this medication. This medication may increase blood sugar. The risk may be higher in patients who already have diabetes. Ask your care team what you can do to lower your risk of diabetes while taking this medication. Talk to your care team if you wish to become pregnant or think you may be pregnant. This medication can cause serious birth defects. Do not breast-feed while taking this medication and for 6 months after stopping therapy. This medication may cause infertility. Talk to your care team if you are concerned about your fertility. What side effects may I notice from receiving this medication? Side effects that you should report to your care team as soon as possible: Allergic reactions--skin rash, itching, hives, swelling of the face, lips, tongue, or throat Gallbladder problems--severe stomach pain, nausea, vomiting, fever High blood sugar (hyperglycemia)--increased thirst or amount of urine, unusual weakness or fatigue, blurry vision Increase in blood pressure Low blood sugar (hypoglycemia)--tremors or shaking, anxiety, sweating, cold   or clammy skin, confusion, dizziness, rapid heartbeat Low thyroid levels (hypothyroidism)--unusual weakness or fatigue, increased sensitivity to cold,  constipation, hair loss, dry skin, weight gain, feelings of depression Slow heartbeat--dizziness, feeling faint or lightheaded, confusion, trouble breathing, unusual weakness or fatigue Side effects that usually do not require medical attention (report to your care team if they continue or are bothersome): Diarrhea Dizziness Headache Muscle spasms Nausea Pain, redness, irritation, or bruising at the injection site Stomach pain This list may not describe all possible side effects. Call your doctor for medical advice about side effects. You may report side effects to FDA at 1-800-FDA-1088. Where should I keep my medication? This medication is given in a hospital or clinic. It will not be stored at home. NOTE: This sheet is a summary. It may not cover all possible information. If you have questions about this medicine, talk to your doctor, pharmacist, or health care provider.  2023 Elsevier/Gold Standard (2021-03-29 00:00:00)  

## 2022-04-14 ENCOUNTER — Ambulatory Visit (HOSPITAL_COMMUNITY): Payer: BC Managed Care – PPO | Admitting: Physician Assistant

## 2022-04-14 ENCOUNTER — Observation Stay (HOSPITAL_COMMUNITY)
Admission: RE | Admit: 2022-04-14 | Discharge: 2022-04-15 | Disposition: A | Discharge status: Home / Self Care | Payer: BC Managed Care – PPO | Attending: Orthopedic Surgery | Admitting: Orthopedic Surgery

## 2022-04-14 ENCOUNTER — Other Ambulatory Visit: Payer: Self-pay

## 2022-04-14 ENCOUNTER — Ambulatory Visit (HOSPITAL_COMMUNITY): Payer: BC Managed Care – PPO | Admitting: Certified Registered"

## 2022-04-14 ENCOUNTER — Encounter (HOSPITAL_COMMUNITY): Payer: Self-pay | Admitting: Orthopedic Surgery

## 2022-04-14 ENCOUNTER — Encounter (HOSPITAL_COMMUNITY)
Admission: RE | Disposition: A | Discharge status: Home / Self Care | Payer: Self-pay | Source: Home / Self Care | Attending: Orthopedic Surgery

## 2022-04-14 DIAGNOSIS — Z7984 Long term (current) use of oral hypoglycemic drugs: Secondary | ICD-10-CM | POA: Insufficient documentation

## 2022-04-14 DIAGNOSIS — E119 Type 2 diabetes mellitus without complications: Secondary | ICD-10-CM | POA: Diagnosis not present

## 2022-04-14 DIAGNOSIS — M179 Osteoarthritis of knee, unspecified: Secondary | ICD-10-CM | POA: Diagnosis present

## 2022-04-14 DIAGNOSIS — G8918 Other acute postprocedural pain: Secondary | ICD-10-CM | POA: Diagnosis not present

## 2022-04-14 DIAGNOSIS — M1711 Unilateral primary osteoarthritis, right knee: Principal | ICD-10-CM | POA: Diagnosis present

## 2022-04-14 DIAGNOSIS — I10 Essential (primary) hypertension: Secondary | ICD-10-CM | POA: Diagnosis not present

## 2022-04-14 DIAGNOSIS — Z01818 Encounter for other preprocedural examination: Secondary | ICD-10-CM

## 2022-04-14 DIAGNOSIS — Z79899 Other long term (current) drug therapy: Secondary | ICD-10-CM | POA: Insufficient documentation

## 2022-04-14 DIAGNOSIS — J449 Chronic obstructive pulmonary disease, unspecified: Secondary | ICD-10-CM | POA: Diagnosis not present

## 2022-04-14 DIAGNOSIS — Z7982 Long term (current) use of aspirin: Secondary | ICD-10-CM | POA: Insufficient documentation

## 2022-04-14 HISTORY — PX: TOTAL KNEE ARTHROPLASTY: SHX125

## 2022-04-14 LAB — GLUCOSE, CAPILLARY
Glucose-Capillary: 94 mg/dL (ref 70–99)
Glucose-Capillary: 87 mg/dL (ref 70–99)
Glucose-Capillary: 254 mg/dL — ABNORMAL HIGH (ref 70–99)

## 2022-04-14 SURGERY — ARTHROPLASTY, KNEE, TOTAL
Anesthesia: Spinal | Site: Knee | Laterality: Right

## 2022-04-14 MED ORDER — DEXAMETHASONE SODIUM PHOSPHATE 10 MG/ML IJ SOLN
INTRAMUSCULAR | Status: AC
  Filled 2022-04-14: qty 1

## 2022-04-14 MED ORDER — ONDANSETRON HCL 4 MG/2ML IJ SOLN: 4.0000 mg | Freq: Four times a day (QID) | INTRAMUSCULAR | Status: DC | PRN

## 2022-04-14 MED ORDER — MIDAZOLAM HCL 2 MG/2ML IJ SOLN
1.0000 mg | INTRAMUSCULAR | Status: DC
  Administered 2022-04-14: 2 mg via INTRAVENOUS
  Filled 2022-04-14: qty 2

## 2022-04-14 MED ORDER — INSULIN ASPART 100 UNIT/ML IJ SOLN
0.0000 [IU] | Freq: Three times a day (TID) | INTRAMUSCULAR | Status: DC
  Administered 2022-04-15: 2 [IU] via SUBCUTANEOUS
  Administered 2022-04-15: 3 [IU] via SUBCUTANEOUS

## 2022-04-14 MED ORDER — LORATADINE 10 MG PO TABS
10.0000 mg | ORAL_TABLET | Freq: Every day | ORAL | Status: DC
  Administered 2022-04-15: 10 mg via ORAL
  Filled 2022-04-14: qty 1

## 2022-04-14 MED ORDER — PROPOFOL 1000 MG/100ML IV EMUL
INTRAVENOUS | Status: AC
  Filled 2022-04-14: qty 100

## 2022-04-14 MED ORDER — OXYCODONE HCL 5 MG PO TABS
5.0000 mg | ORAL_TABLET | ORAL | Status: DC | PRN
  Administered 2022-04-14 (×2): 5 mg via ORAL
  Administered 2022-04-15 (×3): 10 mg via ORAL
  Filled 2022-04-14: qty 2
  Filled 2022-04-14: qty 1
  Filled 2022-04-14: qty 2
  Filled 2022-04-14: qty 1
  Filled 2022-04-14: qty 2

## 2022-04-14 MED ORDER — MEPERIDINE HCL 50 MG/ML IJ SOLN: 6.2500 mg | INTRAMUSCULAR | Status: DC | PRN

## 2022-04-14 MED ORDER — 0.9 % SODIUM CHLORIDE (POUR BTL) OPTIME
TOPICAL | Status: DC | PRN
  Administered 2022-04-14: 1000 mL

## 2022-04-14 MED ORDER — MORPHINE SULFATE (PF) 2 MG/ML IV SOLN: 1.0000 mg | INTRAVENOUS | Status: DC | PRN

## 2022-04-14 MED ORDER — BUPIVACAINE IN DEXTROSE 0.75-8.25 % IT SOLN
INTRATHECAL | Status: DC | PRN
  Administered 2022-04-14: 12 mg via INTRATHECAL

## 2022-04-14 MED ORDER — TRANEXAMIC ACID-NACL 1000-0.7 MG/100ML-% IV SOLN
1000.0000 mg | INTRAVENOUS | Status: AC
  Administered 2022-04-14: 1000 mg via INTRAVENOUS
  Filled 2022-04-14: qty 100

## 2022-04-14 MED ORDER — OXYCODONE HCL 5 MG PO TABS: 5.0000 mg | ORAL_TABLET | Freq: Once | ORAL | Status: DC | PRN

## 2022-04-14 MED ORDER — DOCUSATE SODIUM 100 MG PO CAPS
100.0000 mg | ORAL_CAPSULE | Freq: Two times a day (BID) | ORAL | Status: DC
  Administered 2022-04-14 – 2022-04-15 (×2): 100 mg via ORAL
  Filled 2022-04-14 (×2): qty 1

## 2022-04-14 MED ORDER — POVIDONE-IODINE 10 % EX SWAB
2.0000 | Freq: Once | CUTANEOUS | Status: AC
  Administered 2022-04-14: 2 via TOPICAL

## 2022-04-14 MED ORDER — LACTATED RINGERS IV SOLN: INTRAVENOUS | Status: DC

## 2022-04-14 MED ORDER — ACETAMINOPHEN 10 MG/ML IV SOLN
1000.0000 mg | Freq: Four times a day (QID) | INTRAVENOUS | Status: DC
  Administered 2022-04-14: 1000 mg via INTRAVENOUS
  Filled 2022-04-14: qty 100

## 2022-04-14 MED ORDER — BUPIVACAINE LIPOSOME 1.3 % IJ SUSP
INTRAMUSCULAR | Status: DC | PRN
  Administered 2022-04-14: 20 mL

## 2022-04-14 MED ORDER — METOPROLOL SUCCINATE ER 50 MG PO TB24
100.0000 mg | ORAL_TABLET | Freq: Every day | ORAL | Status: DC
  Administered 2022-04-14: 100 mg via ORAL
  Filled 2022-04-14: qty 2

## 2022-04-14 MED ORDER — MENTHOL 3 MG MT LOZG: 1.0000 | LOZENGE | OROMUCOSAL | Status: DC | PRN

## 2022-04-14 MED ORDER — BUPIVACAINE LIPOSOME 1.3 % IJ SUSP: 20.0000 mL | Freq: Once | INTRAMUSCULAR | Status: DC

## 2022-04-14 MED ORDER — SODIUM CHLORIDE 0.9 % IR SOLN
Status: DC | PRN
  Administered 2022-04-14: 3000 mL

## 2022-04-14 MED ORDER — AMLODIPINE BESYLATE 10 MG PO TABS
10.0000 mg | ORAL_TABLET | Freq: Every day | ORAL | Status: DC
  Administered 2022-04-14: 10 mg via ORAL
  Filled 2022-04-14: qty 1

## 2022-04-14 MED ORDER — PANTOPRAZOLE SODIUM 40 MG PO TBEC
40.0000 mg | DELAYED_RELEASE_TABLET | Freq: Two times a day (BID) | ORAL | Status: DC
  Administered 2022-04-14 – 2022-04-15 (×2): 40 mg via ORAL
  Filled 2022-04-14 (×2): qty 1

## 2022-04-14 MED ORDER — ROPIVACAINE HCL 7.5 MG/ML IJ SOLN
INTRAMUSCULAR | Status: DC | PRN
  Administered 2022-04-14: 20 mL via PERINEURAL

## 2022-04-14 MED ORDER — ROSUVASTATIN CALCIUM 10 MG PO TABS
10.0000 mg | ORAL_TABLET | Freq: Every day | ORAL | Status: DC
  Administered 2022-04-14: 10 mg via ORAL
  Filled 2022-04-14: qty 1

## 2022-04-14 MED ORDER — OXYCODONE HCL 5 MG/5ML PO SOLN: 5.0000 mg | Freq: Once | ORAL | Status: DC | PRN

## 2022-04-14 MED ORDER — MIDAZOLAM HCL 2 MG/2ML IJ SOLN
INTRAMUSCULAR | Status: AC
  Filled 2022-04-14: qty 2

## 2022-04-14 MED ORDER — METOCLOPRAMIDE HCL 5 MG/ML IJ SOLN: 5.0000 mg | Freq: Three times a day (TID) | INTRAMUSCULAR | Status: DC | PRN

## 2022-04-14 MED ORDER — CEFAZOLIN SODIUM-DEXTROSE 2-4 GM/100ML-% IV SOLN
2.0000 g | INTRAVENOUS | Status: AC
  Administered 2022-04-14: 2 g via INTRAVENOUS
  Filled 2022-04-14: qty 100

## 2022-04-14 MED ORDER — MONTELUKAST SODIUM 10 MG PO TABS
10.0000 mg | ORAL_TABLET | Freq: Every day | ORAL | Status: DC
  Administered 2022-04-14: 10 mg via ORAL
  Filled 2022-04-14: qty 1

## 2022-04-14 MED ORDER — METHOCARBAMOL 1000 MG/10ML IJ SOLN: 500.0000 mg | Freq: Four times a day (QID) | INTRAVENOUS | Status: DC | PRN

## 2022-04-14 MED ORDER — SODIUM CHLORIDE (PF) 0.9 % IJ SOLN
INTRAMUSCULAR | Status: DC | PRN
  Administered 2022-04-14: 60 mL

## 2022-04-14 MED ORDER — HYDROMORPHONE HCL 1 MG/ML IJ SOLN: 0.2500 mg | INTRAMUSCULAR | Status: DC | PRN

## 2022-04-14 MED ORDER — PHENOL 1.4 % MT LIQD: 1.0000 | OROMUCOSAL | Status: DC | PRN

## 2022-04-14 MED ORDER — SODIUM CHLORIDE 0.9 % IV SOLN: INTRAVENOUS | Status: DC

## 2022-04-14 MED ORDER — DEXAMETHASONE SODIUM PHOSPHATE 10 MG/ML IJ SOLN
8.0000 mg | Freq: Once | INTRAMUSCULAR | Status: AC
  Administered 2022-04-14: 8 mg via INTRAVENOUS

## 2022-04-14 MED ORDER — FENTANYL CITRATE PF 50 MCG/ML IJ SOSY
50.0000 ug | PREFILLED_SYRINGE | INTRAMUSCULAR | Status: DC
  Administered 2022-04-14: 50 ug via INTRAVENOUS
  Filled 2022-04-14: qty 2

## 2022-04-14 MED ORDER — PROMETHAZINE HCL 25 MG/ML IJ SOLN: 6.2500 mg | INTRAMUSCULAR | Status: DC | PRN

## 2022-04-14 MED ORDER — VENLAFAXINE HCL ER 150 MG PO CP24
150.0000 mg | ORAL_CAPSULE | Freq: Every day | ORAL | Status: DC
  Administered 2022-04-15: 150 mg via ORAL
  Filled 2022-04-14: qty 1

## 2022-04-14 MED ORDER — LIDOCAINE HCL (PF) 2 % IJ SOLN
INTRAMUSCULAR | Status: AC
Start: 2022-04-14 — End: ?
  Filled 2022-04-14: qty 5

## 2022-04-14 MED ORDER — PHENYLEPHRINE HCL-NACL 20-0.9 MG/250ML-% IV SOLN
INTRAVENOUS | Status: DC | PRN
  Administered 2022-04-14: 20 ug/min via INTRAVENOUS

## 2022-04-14 MED ORDER — RIVAROXABAN 10 MG PO TABS
10.0000 mg | ORAL_TABLET | Freq: Every day | ORAL | Status: DC
  Administered 2022-04-15: 10 mg via ORAL
  Filled 2022-04-14: qty 1

## 2022-04-14 MED ORDER — BISACODYL 10 MG RE SUPP: 10.0000 mg | Freq: Every day | RECTAL | Status: DC | PRN

## 2022-04-14 MED ORDER — METHOCARBAMOL 500 MG PO TABS
500.0000 mg | ORAL_TABLET | Freq: Four times a day (QID) | ORAL | Status: DC | PRN
  Administered 2022-04-14 – 2022-04-15 (×3): 500 mg via ORAL
  Filled 2022-04-14 (×3): qty 1

## 2022-04-14 MED ORDER — ONDANSETRON HCL 4 MG/2ML IJ SOLN
INTRAMUSCULAR | Status: AC
Start: 2022-04-14 — End: ?
  Filled 2022-04-14: qty 2

## 2022-04-14 MED ORDER — DOXYCYCLINE HYCLATE 100 MG PO TABS: 100.0000 mg | ORAL_TABLET | Freq: Two times a day (BID) | ORAL | Status: DC

## 2022-04-14 MED ORDER — FLEET ENEMA 7-19 GM/118ML RE ENEM: 1.0000 | ENEMA | Freq: Once | RECTAL | Status: DC | PRN

## 2022-04-14 MED ORDER — SODIUM CHLORIDE (PF) 0.9 % IJ SOLN
INTRAMUSCULAR | Status: AC
  Filled 2022-04-14: qty 10

## 2022-04-14 MED ORDER — SODIUM CHLORIDE (PF) 0.9 % IJ SOLN
INTRAMUSCULAR | Status: AC
  Filled 2022-04-14: qty 50

## 2022-04-14 MED ORDER — MIDAZOLAM HCL 2 MG/2ML IJ SOLN
INTRAMUSCULAR | Status: DC | PRN
  Administered 2022-04-14: 1 mg via INTRAVENOUS

## 2022-04-14 MED ORDER — BUPIVACAINE LIPOSOME 1.3 % IJ SUSP
INTRAMUSCULAR | Status: AC
  Filled 2022-04-14: qty 20

## 2022-04-14 MED ORDER — ALBUTEROL SULFATE HFA 108 (90 BASE) MCG/ACT IN AERS: 2.0000 | INHALATION_SPRAY | Freq: Four times a day (QID) | RESPIRATORY_TRACT | Status: DC | PRN

## 2022-04-14 MED ORDER — MIDAZOLAM HCL 2 MG/2ML IJ SOLN: 0.5000 mg | Freq: Once | INTRAMUSCULAR | Status: DC | PRN

## 2022-04-14 MED ORDER — LIDOCAINE 2% (20 MG/ML) 5 ML SYRINGE
INTRAMUSCULAR | Status: DC | PRN
  Administered 2022-04-14: 40 mg via INTRAVENOUS

## 2022-04-14 MED ORDER — POLYETHYLENE GLYCOL 3350 17 G PO PACK: 17.0000 g | PACK | Freq: Every day | ORAL | Status: DC | PRN

## 2022-04-14 MED ORDER — DEXAMETHASONE SODIUM PHOSPHATE 10 MG/ML IJ SOLN
10.0000 mg | Freq: Once | INTRAMUSCULAR | Status: AC
  Administered 2022-04-15: 10 mg via INTRAVENOUS
  Filled 2022-04-14: qty 1

## 2022-04-14 MED ORDER — METOCLOPRAMIDE HCL 5 MG PO TABS: 5.0000 mg | ORAL_TABLET | Freq: Three times a day (TID) | ORAL | Status: DC | PRN

## 2022-04-14 MED ORDER — GABAPENTIN 300 MG PO CAPS
300.0000 mg | ORAL_CAPSULE | Freq: Three times a day (TID) | ORAL | Status: DC
  Administered 2022-04-14 – 2022-04-15 (×4): 300 mg via ORAL
  Filled 2022-04-14 (×4): qty 1

## 2022-04-14 MED ORDER — PROPOFOL 10 MG/ML IV BOLUS
INTRAVENOUS | Status: AC
  Filled 2022-04-14: qty 20

## 2022-04-14 MED ORDER — ONDANSETRON HCL 4 MG/2ML IJ SOLN
INTRAMUSCULAR | Status: DC | PRN
  Administered 2022-04-14: 4 mg via INTRAVENOUS

## 2022-04-14 MED ORDER — PROPOFOL 500 MG/50ML IV EMUL
INTRAVENOUS | Status: DC | PRN
  Administered 2022-04-14: 100 ug/kg/min via INTRAVENOUS

## 2022-04-14 MED ORDER — ALBUTEROL SULFATE (2.5 MG/3ML) 0.083% IN NEBU: 2.5000 mg | INHALATION_SOLUTION | Freq: Four times a day (QID) | RESPIRATORY_TRACT | Status: DC | PRN

## 2022-04-14 MED ORDER — DIPHENHYDRAMINE HCL 12.5 MG/5ML PO ELIX: 12.5000 mg | ORAL_SOLUTION | ORAL | Status: DC | PRN

## 2022-04-14 MED ORDER — CEFAZOLIN SODIUM-DEXTROSE 2-4 GM/100ML-% IV SOLN
2.0000 g | Freq: Four times a day (QID) | INTRAVENOUS | Status: AC
  Administered 2022-04-14 (×2): 2 g via INTRAVENOUS
  Filled 2022-04-14 (×2): qty 100

## 2022-04-14 MED ORDER — ONDANSETRON HCL 4 MG PO TABS: 4.0000 mg | ORAL_TABLET | Freq: Four times a day (QID) | ORAL | Status: DC | PRN

## 2022-04-14 SURGICAL SUPPLY — 59 items
ATTUNE MED DOME PAT 38 KNEE (Knees) IMPLANT
ATTUNE PS FEM RT SZ 3 CEM KNEE (Femur) IMPLANT
ATTUNE PSRP INSR SZ3 10 KNEE (Insert) IMPLANT
BAG COUNTER SPONGE SURGICOUNT (BAG) IMPLANT
BAG SPEC THK2 15X12 ZIP CLS (MISCELLANEOUS) ×1
BAG SPNG CNTER NS LX DISP (BAG)
BAG ZIPLOCK 12X15 (MISCELLANEOUS) ×1 IMPLANT
BASEPLATE TIBIAL ROTATING SZ 4 (Knees) IMPLANT
BLADE SAG 18X100X1.27 (BLADE) ×1 IMPLANT
BLADE SAW SGTL 11.0X1.19X90.0M (BLADE) ×1 IMPLANT
BNDG CMPR 5X62 HK CLSR LF (GAUZE/BANDAGES/DRESSINGS) ×1
BNDG CMPR MED 10X6 ELC LF (GAUZE/BANDAGES/DRESSINGS) ×1
BNDG ELASTIC 6INX 5YD STR LF (GAUZE/BANDAGES/DRESSINGS) ×1 IMPLANT
BNDG ELASTIC 6X10 VLCR STRL LF (GAUZE/BANDAGES/DRESSINGS) IMPLANT
BOWL SMART MIX CTS (DISPOSABLE) ×1 IMPLANT
BSPLAT TIB 4 CMNT ROT PLAT STR (Knees) ×1 IMPLANT
CEMENT HV SMART SET (Cement) ×2 IMPLANT
COVER SURGICAL LIGHT HANDLE (MISCELLANEOUS) ×1 IMPLANT
CUFF TOURN SGL QUICK 34 (TOURNIQUET CUFF) ×1
CUFF TRNQT CYL 34X4.125X (TOURNIQUET CUFF) ×1 IMPLANT
DRAPE INCISE IOBAN 66X45 STRL (DRAPES) ×1 IMPLANT
DRAPE U-SHAPE 47X51 STRL (DRAPES) ×1 IMPLANT
DRSG AQUACEL AG ADV 3.5X10 (GAUZE/BANDAGES/DRESSINGS) ×1 IMPLANT
DURAPREP 26ML APPLICATOR (WOUND CARE) ×1 IMPLANT
ELECT REM PT RETURN 15FT ADLT (MISCELLANEOUS) ×1 IMPLANT
GLOVE BIO SURGEON STRL SZ 6.5 (GLOVE) IMPLANT
GLOVE BIO SURGEON STRL SZ7.5 (GLOVE) IMPLANT
GLOVE BIO SURGEON STRL SZ8 (GLOVE) ×1 IMPLANT
GLOVE BIOGEL PI IND STRL 6.5 (GLOVE) IMPLANT
GLOVE BIOGEL PI IND STRL 7.0 (GLOVE) IMPLANT
GLOVE BIOGEL PI IND STRL 8 (GLOVE) ×1 IMPLANT
GOWN STRL REUS W/ TWL LRG LVL3 (GOWN DISPOSABLE) ×1 IMPLANT
GOWN STRL REUS W/ TWL XL LVL3 (GOWN DISPOSABLE) IMPLANT
GOWN STRL REUS W/TWL LRG LVL3 (GOWN DISPOSABLE) ×1
GOWN STRL REUS W/TWL XL LVL3 (GOWN DISPOSABLE) ×1
HANDPIECE INTERPULSE COAX TIP (DISPOSABLE) ×1
HOLDER FOLEY CATH W/STRAP (MISCELLANEOUS) IMPLANT
IMMOBILIZER KNEE 20 (SOFTGOODS) ×1
IMMOBILIZER KNEE 20 THIGH 36 (SOFTGOODS) ×1 IMPLANT
KIT TURNOVER KIT A (KITS) IMPLANT
MANIFOLD NEPTUNE II (INSTRUMENTS) ×1 IMPLANT
NS IRRIG 1000ML POUR BTL (IV SOLUTION) ×1 IMPLANT
PACK TOTAL KNEE CUSTOM (KITS) ×1 IMPLANT
PADDING CAST ABS COTTON 6X4 NS (CAST SUPPLIES) IMPLANT
PADDING CAST COTTON 6X4 STRL (CAST SUPPLIES) ×2 IMPLANT
PIN STEINMAN FIXATION KNEE (PIN) IMPLANT
PROTECTOR NERVE ULNAR (MISCELLANEOUS) ×1 IMPLANT
SET HNDPC FAN SPRY TIP SCT (DISPOSABLE) ×1 IMPLANT
SPIKE FLUID TRANSFER (MISCELLANEOUS) ×1 IMPLANT
STRIP CLOSURE SKIN 1/2X4 (GAUZE/BANDAGES/DRESSINGS) ×2 IMPLANT
SUT MNCRL AB 4-0 PS2 18 (SUTURE) ×1 IMPLANT
SUT STRATAFIX 0 PDS 27 VIOLET (SUTURE) ×1
SUT VIC AB 2-0 CT1 27 (SUTURE) ×3
SUT VIC AB 2-0 CT1 TAPERPNT 27 (SUTURE) ×3 IMPLANT
SUTURE STRATFX 0 PDS 27 VIOLET (SUTURE) ×1 IMPLANT
TRAY FOLEY MTR SLVR 16FR STAT (SET/KITS/TRAYS/PACK) ×1 IMPLANT
TUBE SUCTION HIGH CAP CLEAR NV (SUCTIONS) ×1 IMPLANT
WATER STERILE IRR 1000ML POUR (IV SOLUTION) ×2 IMPLANT
WRAP KNEE MAXI GEL POST OP (GAUZE/BANDAGES/DRESSINGS) ×1 IMPLANT

## 2022-04-14 NOTE — Evaluation (Signed)
Physical Therapy Evaluation Patient Details Name: Leah [name] MRN: [id number] DOB: [birth year] Today's Date: 04/14/2022  History of Present Illness  62 yo female presents to therapy s/p R TKA on 04/14/2022 due to failure of conservative measures. Pt PMH includes but is not limited to: HDL, DM II, OSA on CPAP, B mastectomy, carcinoid syndrome, metastatic neuroendocine ca, and L TKA (2022).  Clinical Impression  Leah [name] is a 62 y.o. female POD 0 s/p R TKA. Patient reports mod I at rollator level  with mobility at baseline. Patient is now limited by functional impairments (see PT problem list below) and requires min guard, HOB elevated, bed rail and increased time for bed mobility and min guard and cues for transfers. Patient was able to ambulate 23 feet with RW and min guard level of assist. Gait limited due to pt felling flushed. Patient instructed in exercise to facilitate ROM and circulation to manage edema.  Patient will benefit from continued skilled PT interventions to address impairments and progress towards PLOF. Acute PT will follow to progress mobility and stair training in preparation for safe discharge home.        Recommendations for follow up therapy are one component of a multi-disciplinary discharge planning process, led by the attending physician.  Recommendations may be updated based on patient status, additional functional criteria and insurance authorization.  Follow Up Recommendations Outpatient PT      Assistance Recommended at Discharge Frequent or constant Supervision/Assistance  Patient can return home with the following  A little help with walking and/or transfers;A little help with bathing/dressing/bathroom;Assistance with cooking/housework;Assist for transportation;Help with stairs or ramp for entrance    Equipment Recommendations Other (comment) (pt has DME in home setting)  Recommendations for Other Services       Functional Status Assessment Patient has  had a recent decline in their functional status and demonstrates the ability to make significant improvements in function in a reasonable and predictable amount of time.     Precautions / Restrictions Precautions Precautions: Knee;Fall Restrictions Weight Bearing Restrictions: No RLE Weight Bearing: Weight bearing as tolerated      Mobility  Bed Mobility Overal bed mobility: Needs Assistance Bed Mobility: Supine to Sit     Supine to sit: Min guard     General bed mobility comments: HOB elevated and use of R bed rail    Transfers Overall transfer level: Needs assistance Equipment used: Rolling walker (2 wheels) Transfers: Sit to/from Stand Sit to Stand: Min guard           General transfer comment: cues for proper UE placement    Ambulation/Gait Ambulation/Gait assistance: Min guard Gait Distance (Feet): 23 Feet Assistive device: Rolling walker (2 wheels) Gait Pattern/deviations: Step-to pattern, Trunk flexed Gait velocity: decreased     General Gait Details: gait limited due to pt report of feeling flushed and dizzy  Stairs            Wheelchair Mobility    Modified Rankin (Stroke Patients Only)       Balance Overall balance assessment: Needs assistance Sitting-balance support: Feet unsupported, No upper extremity supported Sitting balance-Leahy Scale: Fair     Standing balance support: Reliant on assistive device for balance Standing balance-Leahy Scale: Poor                               Pertinent Vitals/Pain Pain Assessment Pain Assessment: 0-10 Pain Score: 5  Pain Location: R  knee Pain Descriptors / Indicators: Constant, Operative site guarding, Throbbing Pain Intervention(s): Limited activity within patient's tolerance, Monitored during session, Premedicated before session, Ice applied    Home Living Family/patient expects to be discharged to:: Private residence Living Arrangements: Spouse/significant other Available  Help at Discharge: Family Type of Home: House Home Access: Stairs to enter Entrance Stairs-Rails: Right Entrance Stairs-Number of Steps: 3   Home Layout: One level Home Equipment: Conservation officer, nature (2 wheels);Rollator (4 wheels);Electric scooter;BSC/3in1      Prior Function Prior Level of Function : Independent/Modified Independent             Mobility Comments: mod I with ADLs, self care tasks  and amb with rollator       Hand Dominance        Extremity/Trunk Assessment        Lower Extremity Assessment Lower Extremity Assessment: RLE deficits/detail RLE Deficits / Details: ankle DF/PF 5/5, SLR < 10 degree lag RLE Sensation: WNL       Communication   Communication: No difficulties  Cognition Arousal/Alertness: Awake/alert Behavior During Therapy: WFL for tasks assessed/performed Overall Cognitive Status: Within Functional Limits for tasks assessed                                          General Comments      Exercises Total Joint Exercises Ankle Circles/Pumps: AROM, Both, 20 reps Straight Leg Raises: Right, AROM, 5 reps   Assessment/Plan    PT Assessment Patient needs continued PT services  PT Problem List Decreased strength;Decreased range of motion;Decreased activity tolerance;Decreased balance;Decreased mobility;Decreased coordination;Decreased knowledge of use of DME;Pain       PT Treatment Interventions DME instruction;Gait training;Stair training;Functional mobility training;Therapeutic activities;Therapeutic exercise;Balance training;Neuromuscular re-education;Patient/family education;Modalities    PT Goals (Current goals can be found in the Care Plan section)  Acute Rehab PT Goals Patient Stated Goal: to be able to navigate 3 steps to enter home without looking like I am going to fall PT Goal Formulation: With patient Potential to Achieve Goals: Good    Frequency 7X/week     Co-evaluation               AM-PAC PT  "6 Clicks" Mobility  Outcome Measure Help needed turning from your back to your side while in a flat bed without using bedrails?: None Help needed moving from lying on your back to sitting on the side of a flat bed without using bedrails?: A Little Help needed moving to and from a bed to a chair (including a wheelchair)?: A Little Help needed standing up from a chair using your arms (e.g., wheelchair or bedside chair)?: A Little Help needed to walk in hospital room?: A Little Help needed climbing 3-5 steps with a railing? : Total 6 Click Score: 17    End of Session Equipment Utilized During Treatment: Gait belt Activity Tolerance: No increased pain;Other (comment) (gait limited due to pt feeling flushed) Patient left: in chair;with call bell/phone within reach;with chair alarm set;with family/visitor present Nurse Communication: Mobility status PT Visit Diagnosis: Unsteadiness on feet (R26.81);Other abnormalities of gait and mobility (R26.89);Muscle weakness (generalized) (M62.81);Difficulty in walking, not elsewhere classified (R26.2);Pain Pain - Right/Left: Right Pain - part of body: Knee    Time: [id number] PT Time Calculation (min) (ACUTE ONLY): 39 min   Charges:   PT Evaluation $PT Eval Low Complexity: 1 Low PT Treatments $Gait Training:  8-22 mins $Therapeutic Activity: 8-22 mins        Baird Lyons, PT   Adair Patter 04/14/2022, 5:53 PM

## 2022-04-14 NOTE — Anesthesia Procedure Notes (Signed)
Anesthesia Regional Block: Adductor canal block   Pre-Anesthetic Checklist: , timeout performed,  Correct Patient, Correct Site, Correct Laterality,  Correct Procedure, Correct Position, site marked,  Risks and benefits discussed,  Surgical consent,  Pre-op evaluation,  At surgeon's request and post-op pain management  Laterality: Right and Lower  Prep: chloraprep       Needles:  Injection technique: Single-shot  Needle Type: Echogenic Needle     Needle Length: 9cm  Needle Gauge: 21     Additional Needles:   Procedures:,,,, ultrasound used (permanent image in chart),,    Narrative:  Start time: 04/14/2022 10:01 AM End time: 04/14/2022 10:07 AM Injection made incrementally with aspirations every 5 mL.  Performed by: Personally  Anesthesiologist: Annye Asa, MD  Additional Notes: Pt identified in Holding room.  Monitors applied. Working IV access confirmed. Sterile prep R thigh.  #21ga ECHOgenic Arrow block needle into adductor canal with US guidance.  20cc 0.75% Ropivacaine injected incrementally after negative test dose.  Patient asymptomatic, VSS, no heme aspirated, tolerated well.   Jenita Seashore, MD

## 2022-04-14 NOTE — Plan of Care (Signed)
Problem: Education: Goal: Knowledge of the prescribed therapeutic regimen will improve Outcome: Progressing   Problem: Clinical Measurements: Goal: Postoperative complications will be avoided or minimized Outcome: Progressing   Problem: Pain Management: Goal: Pain level will decrease with appropriate interventions Outcome: [place], RN 04/14/22 2:58 PM

## 2022-04-14 NOTE — Anesthesia Preprocedure Evaluation (Addendum)
Anesthesia Evaluation  Patient identified by MRN, date of birth, ID band Patient awake    Reviewed: Allergy & Precautions, NPO status , Patient's Chart, lab work & pertinent test results, reviewed documented beta blocker date and time   History of Anesthesia Complications Negative for: history of anesthetic complications  Airway Mallampati: II  TM Distance: >3 FB Neck ROM: Full    Dental  (+) Dental Advisory Given   Pulmonary sleep apnea and Continuous Positive Airway Pressure Ventilation , COPD,  COPD inhaler   breath sounds clear to auscultation       Cardiovascular hypertension, Pt. on medications and Pt. on home beta blockers (-) angina  Rhythm:Regular Rate:Normal  '17 ECHO: Left ventricular systolic function is normal.  No segmental wall motion abnormalities seen in the left ventricle  The right ventricle is normal in size and function.  The left atrium is mildly dilated.  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   Endo/Other  diabetes (glu 94), Oral Hypoglycemic Agents  BMI 36 Mounjaro: last dose 9 days ago  Renal/GU      Musculoskeletal  (+) Arthritis ,    Abdominal  (+) + obese  Peds  Hematology negative hematology ROS (+)   Anesthesia Other Findings H/o breast cancer  Reproductive/Obstetrics                             Anesthesia Physical Anesthesia Plan  ASA: 3  Anesthesia Plan: Spinal   Post-op Pain Management: Regional block* and Ofirmev IV (intra-op)*   Induction: Intravenous  PONV Risk Score and Plan: 2 and Ondansetron  Airway Management Planned: Simple Face Mask and Natural Airway  Additional Equipment: None  Intra-op Plan:   Post-operative Plan:   Informed Consent: I have reviewed the patients  History and Physical, chart, labs and discussed the procedure including the risks, benefits and alternatives for the proposed anesthesia with the patient or authorized representative who has indicated his/her understanding and acceptance.     Dental advisory given  Plan Discussed with: CRNA and Surgeon  Anesthesia Plan Comments: (Plan routine monitors, SAB with adductor canal block for post op analgesia)       Anesthesia Quick Evaluation

## 2022-04-14 NOTE — Discharge Instructions (Addendum)
Gaynelle Arabian, MD Total Joint Specialist [facility] [address], [address] [place], [place] [address] [phone]  TOTAL KNEE REPLACEMENT POSTOPERATIVE DIRECTIONS    Knee Rehabilitation, Guidelines Following Surgery  Results after knee surgery are often greatly improved when you follow the exercise, range of motion and muscle strengthening exercises prescribed by your doctor. Safety measures are also important to protect the knee from further injury. If any of these exercises cause you to have increased pain or swelling in your knee joint, decrease the amount until you are comfortable again and slowly increase them. If you have problems or questions, call your caregiver or physical therapist for advice.   BLOOD CLOT PREVENTION Take a 10 mg Xarelto once a day for three weeks following surgery. Then take an 81 mg Aspirin once a day for three weeks. Then discontinue Aspirin. You may resume your vitamins/supplements once you have discontinued the Xarelto. Do not take any NSAIDs (Advil, Aleve, Ibuprofen, Meloxicam, etc.) until you have discontinued the Xarelto.   HOME CARE INSTRUCTIONS  Remove items at home which could result in a fall. This includes throw rugs or furniture in walking pathways.  ICE to the affected knee as much as tolerated. Icing helps control swelling. If the swelling is well controlled you will be more comfortable and rehab easier. Continue to use ice on the knee for pain and swelling from surgery. You may notice swelling that will progress down to the foot and ankle. This is normal after surgery. Elevate the leg when you are not up walking on it.    Continue to use the breathing machine which will help keep your temperature down. It is common for your temperature to cycle up and down following surgery, especially at night when you are not up moving around and exerting yourself. The breathing machine keeps your lungs expanded and your temperature  down. Do not place pillow under the operative knee, focus on keeping the knee straight while resting  DIET You may resume your previous home diet once you are discharged from the hospital.  DRESSING / WOUND CARE / SHOWERING Keep your bulky bandage on for 2 days. On the third post-operative day you may remove the Ace bandage and gauze. There is a waterproof adhesive bandage on your skin which will stay in place until your first follow-up appointment. Once you remove this you will not need to place another bandage You may begin showering 3 days following surgery, but do not submerge the incision under water.  ACTIVITY For the first 5 days, the key is rest and control of pain and swelling Do your home exercises twice a day starting on post-operative day 3. On the days you go to physical therapy, just do the home exercises once that day. You should rest, ice and elevate the leg for 50 minutes out of every hour. Get up and walk/stretch for 10 minutes per hour. After 5 days you can increase your activity slowly as tolerated. Walk with your walker as instructed. Use the walker until you are comfortable transitioning to a cane. Walk with the cane in the opposite hand of the operative leg. You may discontinue the cane once you are comfortable and walking steadily. Avoid periods of inactivity such as sitting longer than an hour when not asleep. This helps prevent blood clots.  You may discontinue the knee immobilizer once you are able to perform a straight leg raise while lying down. You may resume a sexual relationship in one month or  when given the OK by your doctor.  You may return to work once you are cleared by your doctor.  Do not drive a car for 6 weeks or until released by your surgeon.  Do not drive while taking narcotics.  TED HOSE STOCKINGS Wear the elastic stockings on both legs for three weeks following surgery during the day. You may remove them at night for sleeping.  WEIGHT  BEARING Weight bearing as tolerated with assist device (walker, cane, etc) as directed, use it as long as suggested by your surgeon or therapist, typically at least 4-6 weeks.  POSTOPERATIVE CONSTIPATION PROTOCOL Constipation - defined medically as fewer than three stools per week and severe constipation as less than one stool per week.  One of the most common issues patients have following surgery is constipation.  Even if you have a regular bowel pattern at home, your normal regimen is likely to be disrupted due to multiple reasons following surgery.  Combination of anesthesia, postoperative narcotics, change in appetite and fluid intake all can affect your bowels.  In order to avoid complications following surgery, here are some recommendations in order to help you during your recovery period.  Colace (docusate) - Pick up an over-the-counter form of Colace or another stool softener and take twice a day as long as you are requiring postoperative pain medications.  Take with a full glass of water daily.  If you experience loose stools or diarrhea, hold the colace until you stool forms back up. If your symptoms do not get better within 1 week or if they get worse, check with your doctor. Dulcolax (bisacodyl) - Pick up over-the-counter and take as directed by the product packaging as needed to assist with the movement of your bowels.  Take with a full glass of water.  Use this product as needed if not relieved by Colace only.  MiraLax (polyethylene glycol) - Pick up over-the-counter to have on hand. MiraLax is a solution that will increase the amount of water in your bowels to assist with bowel movements.  Take as directed and can mix with a glass of water, juice, soda, coffee, or tea. Take if you go more than two days without a movement. Do not use MiraLax more than once per day. Call your doctor if you are still constipated or irregular after using this medication for 7 days in a row.  If you continue  to have problems with postoperative constipation, please contact the office for further assistance and recommendations.  If you experience "the worst abdominal pain ever" or develop nausea or vomiting, please contact the office immediatly for further recommendations for treatment.  ITCHING If you experience itching with your medications, try taking only a single pain pill, or even half a pain pill at a time.  You can also use Benadryl over the counter for itching or also to help with sleep.   MEDICATIONS See your medication summary on the "After Visit Summary" that the nursing staff will review with you prior to discharge.  You may have some home medications which will be placed on hold until you complete the course of blood thinner medication.  It is important for you to complete the blood thinner medication as prescribed by your surgeon.  Continue your approved medications as instructed at time of discharge.  PRECAUTIONS If you experience chest pain or shortness of breath - call 911 immediately for transfer to the hospital emergency department.  If you develop a fever greater that 101 F, purulent  drainage from wound, increased redness or drainage from wound, foul odor from the wound/dressing, or calf pain - CONTACT YOUR SURGEON.                                                   FOLLOW-UP APPOINTMENTS Make sure you keep all of your appointments after your operation with your surgeon and caregivers. You should call the office at the above phone number and make an appointment for approximately two weeks after the date of your surgery or on the date instructed by your surgeon outlined in the "After Visit Summary".  RANGE OF MOTION AND STRENGTHENING EXERCISES  Rehabilitation of the knee is important following a knee injury or an operation. After just a few days of immobilization, the muscles of the thigh which control the knee become weakened and shrink (atrophy). Knee exercises are designed to build up  the tone and strength of the thigh muscles and to improve knee motion. Often times heat used for twenty to thirty minutes before working out will loosen up your tissues and help with improving the range of motion but do not use heat for the first two weeks following surgery. These exercises can be done on a training (exercise) mat, on the floor, on a table or on a bed. Use what ever works the best and is most comfortable for you Knee exercises include:  Leg Lifts - While your knee is still immobilized in a splint or cast, you can do straight leg raises. Lift the leg to 60 degrees, hold for 3 sec, and slowly lower the leg. Repeat 10-20 times 2-3 times daily. Perform this exercise against resistance later as your knee gets better.  Quad and Hamstring Sets - Tighten up the muscle on the front of the thigh (Quad) and hold for 5-10 sec. Repeat this 10-20 times hourly. Hamstring sets are done by pushing the foot backward against an object and holding for 5-10 sec. Repeat as with quad sets.  Leg Slides: Lying on your back, slowly slide your foot toward your buttocks, bending your knee up off the floor (only go as far as is comfortable). Then slowly slide your foot back down until your leg is flat on the floor again. Angel Wings: Lying on your back spread your legs to the side as far apart as you can without causing discomfort.  A rehabilitation program following serious knee injuries can speed recovery and prevent re-injury in the future due to weakened muscles. Contact your doctor or a physical therapist for more information on knee rehabilitation.   POST-OPERATIVE OPIOID TAPER INSTRUCTIONS: It is important to wean off of your opioid medication as soon as possible. If you do not need pain medication after your surgery it is ok to stop day one. Opioids include: Codeine, Hydrocodone(Norco, Vicodin), Oxycodone(Percocet, oxycontin) and hydromorphone amongst others.  Long term and even short term use of opiods can  cause: Increased pain response Dependence Constipation Depression Respiratory depression And more.  Withdrawal symptoms can include Flu like symptoms Nausea, vomiting And more Techniques to manage these symptoms Hydrate well Eat regular healthy meals Stay active Use relaxation techniques(deep breathing, meditating, yoga) Do Not substitute Alcohol to help with tapering If you have been on opioids for less than two weeks and do not have pain than it is ok to stop all together.  Plan to  wean off of opioids This plan should start within one week post op of your joint replacement. Maintain the same interval or time between taking each dose and first decrease the dose.  Cut the total daily intake of opioids by one tablet each day Next start to increase the time between doses. The last dose that should be eliminated is the evening dose.   IF YOU ARE TRANSFERRED TO A SKILLED REHAB FACILITY If the patient is transferred to a skilled rehab facility following release from the hospital, a list of the current medications will be sent to the facility for the patient to continue.  When discharged from the skilled rehab facility, please have the facility set up the patient's Home Health Physical Therapy prior to being released. Also, the skilled facility will be responsible for providing the patient with their medications at time of release from the facility to include their pain medication, the muscle relaxants, and their blood thinner medication. If the patient is still at the rehab facility at time of the two week follow up appointment, the skilled rehab facility will also need to assist the patient in arranging follow up appointment in our office and any transportation needs.  MAKE SURE YOU:  Understand these instructions.  Get help right away if you are not doing well or get worse.   DENTAL ANTIBIOTICS:  In most cases prophylactic antibiotics for Dental procdeures after total joint surgery are  not necessary.  Exceptions are as follows:  1. History of prior total joint infection  2. Severely immunocompromised (Organ Transplant, cancer chemotherapy, Rheumatoid biologic meds such as Humera)  3. Poorly controlled diabetes (A1C &gt; 8.0, blood glucose over 200)  If you have one of these conditions, contact your surgeon for an antibiotic prescription, prior to your dental procedure.    Pick up stool softner and laxative for home use following surgery while on pain medications. Do not submerge incision under water. Please use good hand washing techniques while changing dressing each day. May shower starting three days after surgery. Please use a clean towel to pat the incision dry following showers. Continue to use ice for pain and swelling after surgery. Do not use any lotions or creams on the incision until instructed by your surgeon.  Information on my medicine - XARELTO (Rivaroxaban)  Why was Xarelto prescribed for you? Xarelto was prescribed for you to reduce the risk of blood clots forming after orthopedic surgery. The medical term for these abnormal blood clots is venous thromboembolism (VTE).  What do you need to know about xarelto ? Take your Xarelto ONCE DAILY at the same time every day. You may take it either with or without food.  If you have difficulty swallowing the tablet whole, you may crush it and mix in applesauce just prior to taking your dose.  Take Xarelto exactly as prescribed by your doctor and DO NOT stop taking Xarelto without talking to the doctor who prescribed the medication.  Stopping without other VTE prevention medication to take the place of Xarelto may increase your risk of developing a clot.  After discharge, you should have regular check-up appointments with your healthcare provider that is prescribing your Xarelto.    What do you do if you miss a dose? If you miss a dose, take it as soon as you remember on the same day then  continue your regularly scheduled once daily regimen the next day. Do not take two doses of Xarelto on the same day.   Important   Safety Information A possible side effect of Xarelto is bleeding. You should call your healthcare provider right away if you experience any of the following: Bleeding from an injury or your nose that does not stop. Unusual colored urine (red or dark brown) or unusual colored stools (red or black). Unusual bruising for unknown reasons. A serious fall or if you hit your head (even if there is no bleeding).  Some medicines may interact with Xarelto and might increase your risk of bleeding while on Xarelto. To help avoid this, consult your healthcare provider or pharmacist prior to using any new prescription or non-prescription medications, including herbals, vitamins, non-steroidal anti-inflammatory drugs (NSAIDs) and supplements.  This website has more information on Xarelto: www.xarelto.com.    

## 2022-04-14 NOTE — Progress Notes (Signed)
Orthopedic Tech Progress Note Patient Details:  MAKINNA EAKLE [birth year] [id number]  CPM Right Knee CPM Right Knee: Off Right Knee Flexion (Degrees): 40 Right Knee Extension (Degrees): 10  Post Interventions Patient Tolerated: Well Patient requested CPM be removed and not applied again today due to discomfort. Vernona Rieger 04/14/2022, 4:33 PM

## 2022-04-14 NOTE — Interval H&P Note (Signed)
History and Physical Interval Note:  04/14/2022 8:33 AM  Leah [name]  has presented today for surgery, with the diagnosis of right knee osteoarthritis.  The various methods of treatment have been discussed with the patient and family. After consideration of risks, benefits and other options for treatment, the patient has consented to  Procedure(s): TOTAL KNEE ARTHROPLASTY (Right) as a surgical intervention.  The patient's history has been reviewed, patient examined, no change in status, stable for surgery.  I have reviewed the patient's chart and labs.  Questions were answered to the patient's satisfaction.     Pilar Plate [name]

## 2022-04-14 NOTE — Anesthesia Procedure Notes (Signed)
Procedure Name: MAC Date/Time: 04/14/2022 10:32 AM  Performed by: Eben Burow, CRNAPre-anesthesia Checklist: Patient identified, Emergency Drugs available, Suction available, Patient being monitored and Timeout performed Oxygen Delivery Method: Simple face mask Placement Confirmation: positive ETCO2

## 2022-04-14 NOTE — Op Note (Signed)
OPERATIVE REPORT-TOTAL KNEE ARTHROPLASTY   Pre-operative diagnosis- Osteoarthritis  Right knee(s)  Post-operative diagnosis- Osteoarthritis Right knee(s)  Procedure-  Right  Total Knee Arthroplasty  Surgeon- Dione Plover. Daveon Arpino, MD  Assistant- Molli Barrows, PA-C   Anesthesia-   Adductor canal block and spinal  EBL- 25 ml   Drains None  Tourniquet time-  Total Tourniquet Time Documented: Thigh (Right) - 34 minutes Total: Thigh (Right) - 34 minutes     Complications- None  Condition-PACU - hemodynamically stable.   Brief Clinical Note  Leah [name] is a 62 y.o. year old female with end stage OA of her right knee with progressively worsening pain and dysfunction. She has constant pain, with activity and at rest and significant functional deficits with difficulties even with ADLs. She has had extensive non-op management including analgesics, injections of cortisone and viscosupplements, and home exercise program, but remains in significant pain with significant dysfunction.Radiographs show bone on bone arthritis medial and patellofemoral. She presents now for right Total Knee Arthroplasty.     Procedure in detail---   The patient is brought into the operating room and positioned supine on the operating table. After successful administration of  Adductor canal block and spinal,   a tourniquet is placed high on the  Right thigh(s) and the lower extremity is prepped and draped in the usual sterile fashion. Time out is performed by the operating team and then the  Right lower extremity is wrapped in Esmarch, knee flexed and the tourniquet inflated to 300 mmHg.       A midline incision is made with a ten blade through the subcutaneous tissue to the level of the extensor mechanism. A fresh blade is used to make a medial parapatellar arthrotomy. Soft tissue over the proximal medial tibia is subperiosteally elevated to the joint line with a knife and into the semimembranosus bursa with a  Cobb elevator. Soft tissue over the proximal lateral tibia is elevated with attention being paid to avoiding the patellar tendon on the tibial tubercle. The patella is everted, knee flexed 90 degrees and the ACL and PCL are removed. Findings are bone on bone medial and patellofemoral with large global osteophytes        The drill is used to create a starting hole in the distal femur and the canal is thoroughly irrigated with sterile saline to remove the fatty contents. The 5 degree Right  valgus alignment guide is placed into the femoral canal and the distal femoral cutting block is pinned to remove 9 mm off the distal femur. Resection is made with an oscillating saw.      The tibia is subluxed forward and the menisci are removed. The extramedullary alignment guide is placed referencing proximally at the medial aspect of the tibial tubercle and distally along the second metatarsal axis and tibial crest. The block is pinned to remove 40m off the more deficient medial  side. Resection is made with an oscillating saw. Size 4 is the most appropriate size for the tibia and the proximal tibia is prepared with the modular drill and keel punch for that size.      The femoral sizing guide is placed and size 4 is most appropriate. Rotation is marked off the epicondylar axis and confirmed by creating a rectangular flexion gap at 90 degrees. The size 4 cutting block is pinned in this rotation and the anterior, posterior and chamfer cuts are made with the oscillating saw. The intercondylar block is then placed and that cut  is made.      Trial size 4 tibial component, trial size 4 narrow posterior stabilized femur and a 10  mm posterior stabilized rotating platform insert trial is placed. Full extension is achieved with excellent varus/valgus and anterior/posterior balance throughout full range of motion. The patella is everted and thickness measured to be 22  mm. Free hand resection is taken to 12 mm, a 38 template is  placed, lug holes are drilled, trial patella is placed, and it tracks normally. Osteophytes are removed off the posterior femur with the trial in place. All trials are removed and the cut bone surfaces prepared with pulsatile lavage. Cement is mixed and once ready for implantation, the size 4 tibial implant, size  4 narrow posterior stabilized femoral component, and the size 38 patella are cemented in place and the patella is held with the clamp. The trial insert is placed and the knee held in full extension. The Exparel (20 ml mixed with 60 ml saline) is injected into the extensor mechanism, posterior capsule, medial and lateral gutters and subcutaneous tissues.  All extruded cement is removed and once the cement is hard the permanent 10 mm posterior stabilized rotating platform insert is placed into the tibial tray.      The wound is copiously irrigated with saline solution and the extensor mechanism closed with # 0 Stratofix suture. The tourniquet is released for a total tourniquet time of 34  minutes. Flexion against gravity is 140 degrees and the patella tracks normally. Subcutaneous tissue is closed with 2.0 vicryl and subcuticular with running 4.0 Monocryl. The incision is cleaned and dried and steri-strips and a bulky sterile dressing are applied. The limb is placed into a knee immobilizer and the patient is awakened and transported to recovery in stable condition.      Please note that a surgical assistant was a medical necessity for this procedure in order to perform it in a safe and expeditious manner. Surgical assistant was necessary to retract the ligaments and vital neurovascular structures to prevent injury to them and also necessary for proper positioning of the limb to allow for anatomic placement of the prosthesis.   Dione Plover Wonda Goodgame, MD    04/14/2022, 11:43 AM

## 2022-04-14 NOTE — Transfer of Care (Signed)
Immediate Anesthesia Transfer of Care Note  Patient: Leah [name]  Procedure(s) Performed: TOTAL KNEE ARTHROPLASTY (Right: Knee)  Patient Location: PACU  Anesthesia Type:Spinal  Level of Consciousness: awake, alert , and patient cooperative  Airway & Oxygen Therapy: Patient Spontanous Breathing and Patient connected to face mask oxygen  Post-op Assessment: Report given to RN and Post -op Vital signs reviewed and stable  Post vital signs: Reviewed and stable  Last Vitals:  Vitals Value Taken Time  BP 97/54 04/14/22 1208  Temp    Pulse 83 04/14/22 1211  Resp 25 04/14/22 1211  SpO2 90 % 04/14/22 1211  Vitals shown include unvalidated device data.  Last Pain:  Vitals:   04/14/22 0835  TempSrc:   PainSc: 3          Complications: No notable events documented.

## 2022-04-14 NOTE — Progress Notes (Signed)
Orthopedic Tech Progress Note Patient Details:  Leah [name] 10-Mar-1960 [id number]  CPM Right Knee CPM Right Knee: On Right Knee Flexion (Degrees): 40 Right Knee Extension (Degrees): 10  Post Interventions Patient Tolerated: Well  Vernona Rieger 04/14/2022, 1:33 PM

## 2022-04-14 NOTE — Anesthesia Procedure Notes (Signed)
Spinal  Patient location during procedure: OR End time: 04/14/2022 10:42 AM Reason for block: surgical anesthesia Staffing Performed: anesthesiologist  Anesthesiologist: Annye Asa, MD Resident/CRNA: Eben Burow, CRNA Performed by: Annye Asa, MD Authorized by: Annye Asa, MD   Preanesthetic Checklist Completed: patient identified, IV checked, site marked, risks and benefits discussed, surgical consent, monitors and equipment checked, pre-op evaluation and timeout performed Spinal Block Patient position: sitting Prep: DuraPrep and site prepped and draped Patient monitoring: blood pressure, continuous pulse ox, cardiac monitor and heart rate Approach: midline Location: L3-4 Injection technique: single-shot Needle Needle type: Quincke and Introducer  Needle gauge: 24 G Needle length: 9 cm Assessment Events: CSF return and second provider Additional Notes Pt identified in Operating room.  Monitors applied. Working IV access confirmed. Sterile prep, drape lumbar spine.  1% lido local L 3,4.  #24ga Pencan all attempts os (CRNNA, then #24 ga Pencan into clear CSF L 3,4 Glennon Mac)  '12mg'$  0.75% Bupivacaine with dextrose injected with asp CSF beginning and end of injection.  Patient asymptomatic, VSS, no heme aspirated, tolerated well.  Jenita Seashore, MD

## 2022-04-14 NOTE — Anesthesia Postprocedure Evaluation (Signed)
Anesthesia Post Note  Patient: CHERYLYN TEED  Procedure(s) Performed: TOTAL KNEE ARTHROPLASTY (Right: Knee)     Patient location during evaluation: PACU Anesthesia Type: Spinal Level of consciousness: awake and alert, patient cooperative and oriented Pain management: pain level controlled Vital Signs Assessment: post-procedure vital signs reviewed and stable Respiratory status: nonlabored ventilation, spontaneous breathing and respiratory function stable Cardiovascular status: blood pressure returned to baseline and stable Postop Assessment: no apparent nausea or vomiting, patient able to bend at knees and spinal receding Anesthetic complications: no   No notable events documented.  Last Vitals:  Vitals:   04/14/22 1212 04/14/22 1215  BP: (!) 97/54 (!) 104/55  Pulse: 83 83  Resp: 17 18  Temp: 36.5 C   SpO2: 90% 91%    Last Pain:  Vitals:   04/14/22 1215  TempSrc:   PainSc: 0-No pain                 Shirleen Mcfaul,E. Linn Goetze

## 2022-04-15 DIAGNOSIS — Z79899 Other long term (current) drug therapy: Secondary | ICD-10-CM | POA: Diagnosis not present

## 2022-04-15 DIAGNOSIS — M1711 Unilateral primary osteoarthritis, right knee: Secondary | ICD-10-CM | POA: Diagnosis not present

## 2022-04-15 DIAGNOSIS — I10 Essential (primary) hypertension: Secondary | ICD-10-CM | POA: Diagnosis not present

## 2022-04-15 DIAGNOSIS — Z7982 Long term (current) use of aspirin: Secondary | ICD-10-CM | POA: Diagnosis not present

## 2022-04-15 DIAGNOSIS — E119 Type 2 diabetes mellitus without complications: Secondary | ICD-10-CM | POA: Diagnosis not present

## 2022-04-15 DIAGNOSIS — Z7984 Long term (current) use of oral hypoglycemic drugs: Secondary | ICD-10-CM | POA: Diagnosis not present

## 2022-04-15 LAB — BASIC METABOLIC PANEL
Anion gap: 4 — ABNORMAL LOW (ref 5–15)
BUN: 13 mg/dL (ref 8–23)
CO2: 25 mmol/L (ref 22–32)
Calcium: 8.3 mg/dL — ABNORMAL LOW (ref 8.9–10.3)
Chloride: 107 mmol/L (ref 98–111)
Creatinine, Ser: 0.84 mg/dL (ref 0.44–1.00)
GFR, Estimated: >60 mL/min (ref >60)
Glucose, Bld: 201 mg/dL — ABNORMAL HIGH (ref 70–99)
Potassium: 3.6 mmol/L (ref 3.5–5.1)
Sodium: 136 mmol/L (ref 135–145)

## 2022-04-15 LAB — CBC
HCT: 32.8 % — ABNORMAL LOW (ref 36.0–46.0)
Hemoglobin: 10.7 g/dL — ABNORMAL LOW (ref 12.0–15.0)
MCH: 29.3 pg (ref 26.0–34.0)
MCHC: 32.6 g/dL (ref 30.0–36.0)
MCV: 89.9 fL (ref 80.0–100.0)
Platelets: 271 10*3/uL (ref 150–400)
RBC: 3.65 MIL/uL — ABNORMAL LOW (ref 3.87–5.11)
RDW: 14.4 % (ref 11.5–15.5)
WBC: 14.5 10*3/uL — ABNORMAL HIGH (ref 4.0–10.5)
nRBC: 0 % (ref 0.0–0.2)

## 2022-04-15 LAB — GLUCOSE, CAPILLARY
Glucose-Capillary: 149 mg/dL — ABNORMAL HIGH (ref 70–99)
Glucose-Capillary: 152 mg/dL — ABNORMAL HIGH (ref 70–99)

## 2022-04-15 MED ORDER — RIVAROXABAN 10 MG PO TABS: 10.0000 mg | ORAL_TABLET | Freq: Every day | ORAL | 0 refills | Status: AC

## 2022-04-15 MED ORDER — METHOCARBAMOL 500 MG PO TABS: 500.0000 mg | ORAL_TABLET | Freq: Four times a day (QID) | ORAL | 0 refills | Status: AC | PRN

## 2022-04-15 MED ORDER — GABAPENTIN 300 MG PO CAPS: 300.0000 mg | ORAL_CAPSULE | ORAL | 0 refills | Status: DC

## 2022-04-15 MED ORDER — OXYCODONE HCL 5 MG PO TABS: 5.0000 mg | ORAL_TABLET | Freq: Four times a day (QID) | ORAL | 0 refills | Status: DC | PRN

## 2022-04-15 NOTE — Progress Notes (Signed)
Subjective: 1 Day Post-Op Procedure(s) (LRB): TOTAL KNEE ARTHROPLASTY (Right) Patient seen in rounds by Dr. Wynelle Link. Patient is well, and has had no acute complaints or problems. Denies SOB or chest pain. Denies calf pain. Foley cath removed this AM. Patient reports pain as moderate. Worked with physical therapy yesterday and ambulated 23'. We will continue physical therapy today.  Objective: Vital signs in last 24 hours: Temp:  [97.7 F (36.5 C)-99.5 F (37.5 C)] 99.5 F (37.5 C) (03/05 0519) Pulse Rate:  [72-95] 85 (03/05 0519) Resp:  [10-19] 16 (03/05 0519) BP: (97-154)/(54-87) 134/77 (03/05 0519) SpO2:  [90 %-100 %] 94 % (03/05 0519) FiO2 (%):  [21 %] 21 % (03/04 2350) Weight:  [81.6 kg] 81.6 kg (03/04 0835)  Intake/Output from previous day:  Intake/Output Summary (Last 24 hours) at 04/15/2022 0659 Last data filed at 04/15/2022 [id number] Gross per 24 hour  Intake 2940.84 ml  Output 1750 ml  Net 1190.84 ml     Intake/Output this shift: Total I/O In: 1005.6 [P.O.:360; I.V.:545.6; IV Piggyback:100] Out: 800 [Urine:800]  Labs: Recent Labs    04/15/22 0338  HGB 10.7*   Recent Labs    04/15/22 0338  WBC 14.5*  RBC 3.65*  HCT 32.8*  PLT 271   Recent Labs    04/15/22 0338  NA 136  K 3.6  CL 107  CO2 25  BUN 13  CREATININE 0.84  GLUCOSE 201*  CALCIUM 8.3*   No results for input(s): "LABPT", "INR" in the last 72 hours.  Exam: General - Patient is Alert and Oriented Extremity - Neurologically intact Neurovascular intact Sensation intact distally Dorsiflexion/Plantar flexion intact Dressing - dressing C/D/I Motor Function - intact, moving foot and toes well on exam.  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   Constipation    Diabetes ([place])    Dyspnea    with exertion   Fatty liver     Gallbladder problem    GE reflux    Hepatic encephalopathy ([place]) 2011   History of PCR DNA positive for HSV1    History of retinal vein occlusion, right eye    HTN (hypertension) 2011   Joint pain    Liver problem    Neuroendocrine tumor 2011   Orthostatic hypotension    Osteoarthritis    Osteopenia    Palpitations    Sarcoidosis 2008   Sleep apnea    uses cpap   Surgical menopause     Assessment/Plan: 1 Day Post-Op Procedure(s) (LRB): TOTAL KNEE ARTHROPLASTY (Right) Principal Problem:   OA (osteoarthritis) of knee Active Problems:   Primary osteoarthritis of right knee  Estimated body mass index is 36.36 kg/m as calculated from the following:   Height as of this encounter: '4\' 11"'$  (1.499 m).   Weight as of this encounter: 81.6 kg. Advance diet Up with therapy D/C IV fluids   Patient's anticipated LOS is less than 2 midnights, meeting these requirements: - Younger than 41 - Lives within 1 hour of care - Has a competent adult at home to recover with post-op recover - NO history of  - Chronic pain requiring opiods  - Diabetes  - Coronary Artery Disease  - Heart failure  - Heart attack  - Stroke  - DVT/VTE  - Cardiac arrhythmia  -  Respiratory Failure/COPD  - Renal failure  - Anemia  DVT Prophylaxis - Xarelto Weight bearing as tolerated.  Continue physical therapy. Expected discharge home today pending progress and if meeting patient goals. Scheduled [facility]PP[facility] at [facility]. Follow-up in clinic in 2 weeks.  The PDMP database was reviewed today prior to any opioid medications being prescribed to this patient.  R. Jaynie Bream, PA-C Orthopedic Surgery [phone] 04/15/2022, 6:59 AM

## 2022-04-15 NOTE — Plan of Care (Signed)
  Problem: Education: Goal: Knowledge of the prescribed therapeutic regimen will improve Outcome: Adequate for Discharge Goal: Individualized Educational Video(s) Outcome: Adequate for Discharge   Problem: Activity: Goal: Ability to avoid complications of mobility impairment will improve Outcome: Adequate for Discharge Goal: Range of joint motion will improve Outcome: Adequate for Discharge   Problem: Clinical Measurements: Goal: Postoperative complications will be avoided or minimized Outcome: Adequate for Discharge   Problem: Pain Management: Goal: Pain level will decrease with appropriate interventions Outcome: Adequate for Discharge   Problem: Skin Integrity: Goal: Will show signs of wound healing Outcome: Adequate for Discharge   Problem: Education: Goal: Ability to describe self-care measures that may prevent or decrease complications (Diabetes Survival Skills Education) will improve Outcome: Adequate for Discharge Goal: Individualized Educational Video(s) Outcome: Adequate for Discharge   Problem: Coping: Goal: Ability to adjust to condition or change in health will improve Outcome: Adequate for Discharge   Problem: Fluid Volume: Goal: Ability to maintain a balanced intake and output will improve Outcome: Adequate for Discharge   Problem: Health Behavior/Discharge Planning: Goal: Ability to identify and utilize available resources and services will improve Outcome: Adequate for Discharge Goal: Ability to manage health-related needs will improve Outcome: Adequate for Discharge   Problem: Metabolic: Goal: Ability to maintain appropriate glucose levels will improve Outcome: Adequate for Discharge   Problem: Nutritional: Goal: Maintenance of adequate nutrition will improve Outcome: Adequate for Discharge Goal: Progress toward achieving an optimal weight will improve Outcome: Adequate for Discharge   Problem: Skin Integrity: Goal: Risk for impaired skin  integrity will decrease Outcome: Adequate for Discharge   Problem: Tissue Perfusion: Goal: Adequacy of tissue perfusion will improve Outcome: Adequate for Discharge   Problem: Education: Goal: Knowledge of General Education information will improve Description: Including pain rating scale, medication(s)/side effects and non-pharmacologic comfort measures Outcome: Adequate for Discharge   Problem: Health Behavior/Discharge Planning: Goal: Ability to manage health-related needs will improve Outcome: Adequate for Discharge   Problem: Clinical Measurements: Goal: Ability to maintain clinical measurements within normal limits will improve Outcome: Adequate for Discharge Goal: Will remain free from infection Outcome: Adequate for Discharge Goal: Diagnostic test results will improve Outcome: Adequate for Discharge Goal: Respiratory complications will improve Outcome: Adequate for Discharge Goal: Cardiovascular complication will be avoided Outcome: Adequate for Discharge   Problem: Activity: Goal: Risk for activity intolerance will decrease Outcome: Adequate for Discharge   Problem: Nutrition: Goal: Adequate nutrition will be maintained Outcome: Adequate for Discharge   Problem: Coping: Goal: Level of anxiety will decrease Outcome: Adequate for Discharge   Problem: Elimination: Goal: Will not experience complications related to bowel motility Outcome: Adequate for Discharge Goal: Will not experience complications related to urinary retention Outcome: Adequate for Discharge   Problem: Pain Managment: Goal: General experience of comfort will improve Outcome: Adequate for Discharge   Problem: Safety: Goal: Ability to remain free from injury will improve Outcome: Adequate for Discharge   Problem: Skin Integrity: Goal: Risk for impaired skin integrity will decrease Outcome: Adequate for Discharge   

## 2022-04-15 NOTE — Progress Notes (Signed)
Physical Therapy Treatment Patient Details Name: Leah [name] MRN: [id number] DOB: 01/24/1961 Today's Date: 04/15/2022   History of Present Illness 62 yo female presents to therapy s/p R TKA on 04/14/2022 due to failure of conservative measures. Pt PMH includes but is not limited to: HDL, DM II, OSA on CPAP, B mastectomy, carcinoid syndrome, metastatic neuroendocine ca, and L TKA (2022).    PT Comments    POD # 1  General Comments: AxO x 3 very pleasant.  Had her "other" knee replaced "a couple of years ago". Assisted OOB to amb in hallway and practice stairs went well.  General bed mobility comments: self able with increased time and use of strap  General Gait Details: had Son "hands on" assist pt with amb a functional distance using safety belt.  General stair comments: 25% VC's on proper tech and sequencing.  Had Son "hands on" assist pt up and down 2 steps using B rails.  Performed well. Then returned to room to perform some TE's following HEP handout.  Instructed on proper tech, freq as well as use of ICE.   Addressed all mobility questions, discussed appropriate activity, educated on use of ICE.  Pt ready for D/C to home.   Recommendations for follow up therapy are one component of a multi-disciplinary discharge planning process, led by the attending physician.  Recommendations may be updated based on patient status, additional functional criteria and insurance authorization.  Follow Up Recommendations  Outpatient PT     Assistance Recommended at Discharge Frequent or constant Supervision/Assistance  Patient can return home with the following A little help with walking and/or transfers;A little help with bathing/dressing/bathroom;Assistance with cooking/housework;Assist for transportation;Help with stairs or ramp for entrance   Equipment Recommendations  None recommended by PT    Recommendations for Other Services       Precautions / Restrictions Precautions Precautions:  Knee;Fall Precaution Comments: instructed no pillow under knee Restrictions Weight Bearing Restrictions: No RLE Weight Bearing: Weight bearing as tolerated     Mobility  Bed Mobility Overal bed mobility: Needs Assistance Bed Mobility: Supine to Sit, Sit to Supine     Supine to sit: Supervision Sit to supine: Supervision   General bed mobility comments: self able with increased time and use of strap    Transfers Overall transfer level: Needs assistance Equipment used: Rolling walker (2 wheels) Transfers: Sit to/from Stand Sit to Stand: Supervision           General transfer comment: good safety cognition and use of hands to steady self    Ambulation/Gait Ambulation/Gait assistance: Supervision Gait Distance (Feet): 35 Feet Assistive device: Rolling walker (2 wheels) Gait Pattern/deviations: Step-to pattern, Trunk flexed Gait velocity: decreased     General Gait Details: had Son "hands on" assist pt with amb a functional distance using safety belt.   Stairs Stairs: Yes Stairs assistance: Supervision, Min guard Stair Management: Two rails, Step to pattern, Forwards Number of Stairs: 2 General stair comments: 25% VC's on proper tech and sequencing.  Had Son "hands on" assist pt up and down 2 steps using B rails.  Performed well.   Wheelchair Mobility    Modified Rankin (Stroke Patients Only)       Balance                                            Cognition Arousal/Alertness: Awake/alert  Overall Cognitive Status: Within Functional Limits for tasks assessed                                 General Comments: AxO x 3 very pleasant.  Had her "other" knee replaced "a couple of years ago".        Exercises   Total Knee Replacement TE's following HEP handout 10 reps B LE ankle pumps 05 reps towel squeezes 05 reps knee presses 05 reps heel slides  05 reps SAQ's 05 reps SLR's 05 reps ABD Educated on use of gait belt  to assist with TE's Followed by ICE    General Comments        Pertinent Vitals/Pain Pain Assessment Pain Assessment: 0-10 Pain Score: 7  Pain Location: R knee Pain Descriptors / Indicators: Constant, Operative site guarding, Throbbing Pain Intervention(s): Monitored during session, Premedicated before session, Repositioned, Ice applied    Home Living                          Prior Function            PT Goals (current goals can now be found in the care plan section) Progress towards PT goals: Progressing toward goals    Frequency    7X/week      PT Plan Current plan remains appropriate    Co-evaluation              AM-PAC PT "6 Clicks" Mobility   Outcome Measure  Help needed turning from your back to your side while in a flat bed without using bedrails?: None Help needed moving from lying on your back to sitting on the side of a flat bed without using bedrails?: None Help needed moving to and from a bed to a chair (including a wheelchair)?: None Help needed standing up from a chair using your arms (e.g., wheelchair or bedside chair)?: None Help needed to walk in hospital room?: A Little Help needed climbing 3-5 steps with a railing? : A Little 6 Click Score: 22    End of Session   Activity Tolerance: Patient tolerated treatment well Patient left: in bed;with call bell/phone within reach;with family/visitor present Nurse Communication: Mobility status PT Visit Diagnosis: Unsteadiness on feet (R26.81);Other abnormalities of gait and mobility (R26.89);Muscle weakness (generalized) (M62.81);Difficulty in walking, not elsewhere classified (R26.2);Pain Pain - Right/Left: Right Pain - part of body: Knee     Time: 1355-1420 PT Time Calculation (min) (ACUTE ONLY): 25 min  Charges:  $Gait Training: 8-22 mins $Therapeutic Exercise: 8-22 mins                     Rica Koyanagi  PTA [place] Office M-F           [phone] Weekend pager [phone]

## 2022-04-15 NOTE — TOC Transition Note (Signed)
Transition of Care [facility]) - CM/SW Discharge Note   Patient Details  Name: Leah [name] MRN: [id number] Date of Birth: [birth year]  Transition of Care [facility] CM/SW Contact:  Lennart Pall, LCSW Phone Number: 04/15/2022, 10:11 AM   Clinical Narrative:     Met with pt and confirming she has needed DME at home.  OPPT already set up with [facility].  No TOC needs.  Final next level of care: OP Rehab Barriers to Discharge: No Barriers Identified   Patient Goals and CMS Choice      Discharge Placement                         Discharge Plan and Services Additional resources added to the After Visit Summary for                  DME Arranged: N/A DME Agency: NA                  Social Determinants of Health (SDOH) Interventions SDOH Screenings   Food Insecurity: No Food Insecurity (04/14/2022)  Housing: Low Risk  (04/14/2022)  Transportation Needs: No Transportation Needs (04/14/2022)  Utilities: Not At Risk (04/14/2022)  Depression (PHQ2-9): Medium Risk (03/11/2022)  Financial Resource Strain: Low Risk  (10/21/2017)  Physical Activity: Insufficiently Active (10/21/2017)  Stress: No Stress Concern Present (10/21/2017)  Tobacco Use: Low Risk  (04/14/2022)     Readmission Risk Interventions     No data to display

## 2022-04-16 ENCOUNTER — Telehealth: Payer: Self-pay

## 2022-04-16 NOTE — Transitions of Care (Post Inpatient/ED Visit) (Signed)
   04/16/2022  Name: Leah [name] MRN: [id number] DOB: [birth year]  Today's TOC FU Call Status: Today's TOC FU Call Status:: Successful TOC FU Call Competed TOC FU Call Complete Date: 04/16/22  Transition Care Management Follow-up Telephone Call Date of Discharge: 04/15/22 Discharge Facility: [facility]) Type of Discharge: Inpatient Admission Primary Inpatient Discharge Diagnosis:: OA (osteoarthritis) of knee How have you been since you were released from the hospital?: Better Any questions or concerns?: No  Items Reviewed: Did you receive and understand the discharge instructions provided?: Yes Medications obtained and verified?: Yes (Medications Reviewed) Any new allergies since your discharge?: No Dietary orders reviewed?: Yes Do you have support at home?: Yes  Home Care and Equipment/Supplies: [place] Ordered?: No Any new equipment or medical supplies ordered?: No  Functional Questionnaire: Do you need assistance with bathing/showering or dressing?: No Do you need assistance with meal preparation?: No Do you need assistance with eating?: No Do you have difficulty maintaining continence: No Do you need assistance with getting out of bed/getting out of a chair/moving?: No Do you have difficulty managing or taking your medications?: No  Folllow up appointments reviewed: PCP Follow-up appointment confirmed?: NA (specialist) [facility] Follow-up appointment confirmed?: Yes Date of Specialist follow-up appointment?: 04/29/22 Follow-Up Specialty Provider:: Dr Maureen Ralphs Do you need transportation to your follow-up appointment?: No Do you understand care options if your condition(s) worsen?: Yes-patient verbalized understanding    [place] LPN [place] Direct Dial [phone]

## 2022-04-16 NOTE — Discharge Summary (Signed)
Physician Discharge Summary   Patient ID: GRAHAM ZALESKI MRN: [id number] DOB/AGE: January 23, 1961 62 y.o.  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   Constipation    Diabetes ([place])    Dyspnea    with exertion   Fatty liver    Gallbladder problem    GE reflux    Hepatic encephalopathy ([place]) 2011   History of PCR DNA positive for HSV1    History of retinal vein occlusion, right eye    HTN (hypertension) 2011   Joint pain    Liver problem    Neuroendocrine tumor 2011   Orthostatic hypotension    Osteoarthritis    Osteopenia    Palpitations    Sarcoidosis 2008   Sleep apnea    uses cpap   Surgical menopause    Discharge Diagnoses:   Principal Problem:   OA (osteoarthritis) of knee Active Problems:   Primary osteoarthritis of right knee  Estimated body mass index is 36.36 kg/m as calculated from the following:   Height as of this encounter: '4\' 11"'$  (1.499 m).   Weight as of this encounter: 81.6 kg.  Procedure:  Procedure(s) (LRB): TOTAL KNEE ARTHROPLASTY (Right)   Consults: None  HPI: JEROLDINE PRIME is a 62 y.o. year old female with end stage OA of her right knee with progressively worsening pain and dysfunction. She has constant pain, with activity and at rest and significant functional deficits with difficulties even with ADLs. She has had extensive non-op management including analgesics, injections of cortisone and viscosupplements, and home exercise program, but remains in significant pain with significant dysfunction.Radiographs show bone on bone arthritis medial and patellofemoral. She presents now for right Total Knee Arthroplasty.  Laboratory Data: Admission on 04/14/2022,  Discharged on 04/15/2022  Component Date Value Ref Range Status   Glucose-Capillary 04/14/2022 94  70 - 99 mg/dL Final   Glucose reference range applies only to samples taken after fasting for at least 8 hours.   Glucose-Capillary 04/14/2022 87  70 - 99 mg/dL Final   Glucose reference range applies only to samples taken after fasting for at least 8 hours.   WBC 04/15/2022 14.5 (H)  4.0 - 10.5 K/uL Final   RBC 04/15/2022 3.65 (L)  3.87 - 5.11 MIL/uL Final   Hemoglobin 04/15/2022 10.7 (L)  12.0 - 15.0 g/dL Final   HCT 04/15/2022 32.8 (L)  36.0 - 46.0 % Final   MCV 04/15/2022 89.9  80.0 - 100.0 fL Final   MCH 04/15/2022 29.3  26.0 - 34.0 pg Final   MCHC 04/15/2022 32.6  30.0 - 36.0 g/dL Final   RDW 04/15/2022 14.4  11.5 - 15.5 % Final   Platelets 04/15/2022 271  150 - 400 K/uL Final   nRBC 04/15/2022 0.0  0.0 - 0.2 % Final   Performed at [facility], [place] [address] [place], [place] [address]   Sodium 04/15/2022 136  135 - 145 mmol/L Final   Potassium 04/15/2022 3.6  3.5 - 5.1 mmol/L Final   Chloride 04/15/2022 107  98 - 111 mmol/L Final   CO2 04/15/2022 25  22 - 32 mmol/L Final   Glucose, Bld 04/15/2022 201 (H)  70 - 99 mg/dL Final   Glucose reference range applies only to samples taken after fasting for at least 8 hours.   BUN 04/15/2022 13  8 - 23 mg/dL Final   Creatinine, Ser 04/15/2022 0.84  0.44 - 1.00 mg/dL Final   Calcium 04/15/2022 8.3 (L)  8.9 - 10.3 mg/dL Final   GFR, Estimated 04/15/2022 >60  >60 mL/min Final   Comment: (NOTE) Calculated using the CKD-EPI Creatinine Equation (2021)    Anion gap 04/15/2022 4 (L)  5 - 15 Final   Performed at [facility], [place] [address] [place], [place] [address]   Glucose-Capillary 04/14/2022 254 (H)  70 - 99 mg/dL Final   Glucose reference range applies only to samples taken after fasting for at least 8 hours.   Glucose-Capillary 04/15/2022 149 (H)  70 - 99 mg/dL Final   Glucose reference range  applies only to samples taken after fasting for at least 8 hours.   Glucose-Capillary 04/15/2022 152 (H)  70 - 99 mg/dL Final   Glucose reference range applies only to samples taken after fasting for at least 8 hours.  Appointment on 04/11/2022  Component Date Value Ref Range Status   WBC Count 04/11/2022 12.0 (H)  4.0 - 10.5 K/uL Final   RBC 04/11/2022 4.70  3.87 - 5.11 MIL/uL Final   Hemoglobin 04/11/2022 13.6  12.0 - 15.0 g/dL Final   HCT 04/11/2022 42.0  36.0 - 46.0 % Final   MCV 04/11/2022 89.4  80.0 - 100.0 fL Final   MCH 04/11/2022 28.9  26.0 - 34.0 pg Final   MCHC 04/11/2022 32.4  30.0 - 36.0 g/dL Final   RDW 04/11/2022 14.4  11.5 - 15.5 % Final   Platelet Count 04/11/2022 330  150 - 400 K/uL Final   nRBC 04/11/2022 0.0  0.0 - 0.2 % Final   Neutrophils Relative % 04/11/2022 64  % Final   Neutro Abs 04/11/2022 7.7  1.7 - 7.7 K/uL Final   Lymphocytes Relative 04/11/2022 27  % Final   Lymphs Abs 04/11/2022 3.2  0.7 - 4.0 K/uL Final   Monocytes Relative 04/11/2022 7  % Final   Monocytes Absolute 04/11/2022 0.8  0.1 - 1.0 K/uL Final   Eosinophils Relative 04/11/2022 1  % Final   Eosinophils Absolute 04/11/2022 0.2  0.0 - 0.5 K/uL Final   Basophils Relative 04/11/2022 1  % Final   Basophils Absolute 04/11/2022 0.1  0.0 - 0.1 K/uL Final   Immature Granulocytes 04/11/2022 0  % Final   Abs Immature Granulocytes 04/11/2022 0.05  0.00 - 0.07 K/uL Final   Performed at [facility], [address] [place], [place] [address]   Sodium 04/11/2022 140  135 - 145 mmol/L Final   Potassium 04/11/2022 5.1  3.5 - 5.1 mmol/L Final   Chloride 04/11/2022 103  98 - 111 mmol/L Final   CO2 04/11/2022 26  22 - 32 mmol/L Final   Glucose, Bld 04/11/2022 128 (H)  70 - 99 mg/dL Final   Glucose reference range applies only to samples taken after fasting for at least 8 hours.   BUN 04/11/2022 15  8 - 23 mg/dL Final   Creatinine 04/11/2022 0.86  0.44 - 1.00 mg/dL  Final   Calcium 04/11/2022 10.0  8.9 - 10.3 mg/dL Final   Total Protein 04/11/2022 7.7  6.5 - 8.1 g/dL Final   Albumin 04/11/2022 4.4  3.5 - 5.0 g/dL Final  AST 04/11/2022 13 (L)  15 - 41 U/L Final   ALT 04/11/2022 12  0 - 44 U/L Final   Alkaline Phosphatase 04/11/2022 82  38 - 126 U/L Final   Total Bilirubin 04/11/2022 0.5  0.3 - 1.2 mg/dL Final   GFR, Estimated 04/11/2022 >60  >60 mL/min Final   Comment: (NOTE) Calculated using the CKD-EPI Creatinine Equation (2021)    Anion gap 04/11/2022 11  5 - 15 Final   Performed at [facility], [address] [place], [place] [address]  Hospital Outpatient Visit on 04/02/2022  Component Date Value Ref Range Status   MRSA, PCR 04/02/2022 NEGATIVE  NEGATIVE Final   Staphylococcus aureus 04/02/2022 NEGATIVE  NEGATIVE Final   Comment: (NOTE) The Xpert SA Assay (FDA approved for NASAL specimens in patients 39 years of age and older), is one component of a comprehensive surveillance program. It is not intended to diagnose infection nor to guide or monitor treatment. Performed at [facility], [place] [address] [place], [place] [address]    WBC 04/02/2022 13.6 (H)  4.0 - 10.5 K/uL Final   RBC 04/02/2022 5.00  3.87 - 5.11 MIL/uL Final   Hemoglobin 04/02/2022 14.3  12.0 - 15.0 g/dL Final   HCT 04/02/2022 45.4  36.0 - 46.0 % Final   MCV 04/02/2022 90.8  80.0 - 100.0 fL Final   MCH 04/02/2022 28.6  26.0 - 34.0 pg Final   MCHC 04/02/2022 31.5  30.0 - 36.0 g/dL Final   RDW 04/02/2022 14.9  11.5 - 15.5 % Final   Platelets 04/02/2022 410 (H)  150 - 400 K/uL Final   nRBC 04/02/2022 0.0  0.0 - 0.2 % Final   Performed at [facility], [place] [address] [place], [place] [address]   Sodium 04/02/2022 139  135 - 145 mmol/L Final   Potassium 04/02/2022 4.9  3.5 - 5.1 mmol/L Final   Chloride 04/02/2022 104  98 - 111 mmol/L Final   CO2 04/02/2022 23  22 - 32 mmol/L Final    Glucose, Bld 04/02/2022 126 (H)  70 - 99 mg/dL Final   Glucose reference range applies only to samples taken after fasting for at least 8 hours.   BUN 04/02/2022 14  8 - 23 mg/dL Final   Creatinine, Ser 04/02/2022 0.97  0.44 - 1.00 mg/dL Final   Calcium 04/02/2022 9.6  8.9 - 10.3 mg/dL Final   GFR, Estimated 04/02/2022 >60  >60 mL/min Final   Comment: (NOTE) Calculated using the CKD-EPI Creatinine Equation (2021)    Anion gap 04/02/2022 12  5 - 15 Final   Performed at [facility], [place] [address], [place], [place] [address]   Hgb A1c MFr Bld 04/02/2022 5.4  4.8 - 5.6 % Final   Comment: (NOTE) Pre diabetes:          5.7%-6.4%  Diabetes:              >6.4%  Glycemic control for   <7.0% adults with diabetes    Mean Plasma Glucose 04/02/2022 108.28  mg/dL Final   Performed at [facility], [place] [address], [place], [place] [address]   Glucose-Capillary 04/02/2022 124 (H)  70 - 99 mg/dL Final   Glucose reference range applies only to samples taken after fasting for at least 8 hours.  Abstract on 03/18/2022  Component Date Value Ref Range Status   [facility] Diabetic Eye Exam 09/27/2021 Retinopathy (  A)  No Retinopathy Final  Appointment on 03/14/2022  Component Date Value Ref Range Status   Sodium 03/14/2022 144  135 - 145 mmol/L Final   Potassium 03/14/2022 5.2 (H)  3.5 - 5.1 mmol/L Final   Chloride 03/14/2022 107  98 - 111 mmol/L Final   CO2 03/14/2022 26  22 - 32 mmol/L Final   Glucose, Bld 03/14/2022 139 (H)  70 - 99 mg/dL Final   Glucose reference range applies only to samples taken after fasting for at least 8 hours.   BUN 03/14/2022 12  8 - 23 mg/dL Final   Creatinine 03/14/2022 0.82  0.44 - 1.00 mg/dL Final   Calcium 03/14/2022 9.9  8.9 - 10.3 mg/dL Final   Total Protein 03/14/2022 7.8  6.5 - 8.1 g/dL Final   Albumin 03/14/2022 4.0  3.5 - 5.0 g/dL Final   AST 03/14/2022 12 (L)  15 - 41 U/L Final   ALT 03/14/2022 15  0 - 44 U/L Final   Alkaline Phosphatase  03/14/2022 83  38 - 126 U/L Final   Total Bilirubin 03/14/2022 0.5  0.3 - 1.2 mg/dL Final   GFR, Estimated 03/14/2022 >60  >60 mL/min Final   Comment: (NOTE) Calculated using the CKD-EPI Creatinine Equation (2021)    Anion gap 03/14/2022 11  5 - 15 Final   Performed at [facility] at [facility], [address], [place], [place] [address]   WBC Count 03/14/2022 13.6 (H)  4.0 - 10.5 K/uL Final   RBC 03/14/2022 4.62  3.87 - 5.11 MIL/uL Final   Hemoglobin 03/14/2022 13.0  12.0 - 15.0 g/dL Final   HCT 03/14/2022 42.2  36.0 - 46.0 % Final   MCV 03/14/2022 91.3  80.0 - 100.0 fL Final   MCH 03/14/2022 28.1  26.0 - 34.0 pg Final   MCHC 03/14/2022 30.8  30.0 - 36.0 g/dL Final   RDW 03/14/2022 14.4  11.5 - 15.5 % Final   Platelet Count 03/14/2022 353  150 - 400 K/uL Final   nRBC 03/14/2022 0.0  0.0 - 0.2 % Final   Neutrophils Relative % 03/14/2022 67  % Final   Neutro Abs 03/14/2022 9.3 (H)  1.7 - 7.7 K/uL Final   Lymphocytes Relative 03/14/2022 23  % Final   Lymphs Abs 03/14/2022 3.1  0.7 - 4.0 K/uL Final   Monocytes Relative 03/14/2022 7  % Final   Monocytes Absolute 03/14/2022 0.9  0.1 - 1.0 K/uL Final   Eosinophils Relative 03/14/2022 1  % Final   Eosinophils Absolute 03/14/2022 0.2  0.0 - 0.5 K/uL Final   Basophils Relative 03/14/2022 1  % Final   Basophils Absolute 03/14/2022 0.1  0.0 - 0.1 K/uL Final   Immature Granulocytes 03/14/2022 1  % Final   Abs Immature Granulocytes 03/14/2022 0.08 (H)  0.00 - 0.07 K/uL Final   Performed at [facility] at [facility], [address] [place], [place] [address]  Office Visit on 03/11/2022  Component Date Value Ref Range Status   HbA1c POC (<> result, manual entry) 03/11/2022 5.8  4.0 - 5.6 % Final     X-Rays:No results found.  EKG: Orders placed or performed during the hospital encounter of 04/02/22   EKG 12 lead per protocol   EKG 12 lead per protocol   *Note: Due to a large number  of results and/or encounters for the requested time period, some results have not been displayed. A complete  set of results can be found in Results Review.     Hospital Course: MYCHAELA KRUKOWSKI is a 62 y.o. who was admitted to [facility]. They were brought to the operating room on 04/14/2022 and underwent Procedure(s): TOTAL KNEE ARTHROPLASTY.  Patient tolerated the procedure well and was later transferred to the recovery room and then to the orthopaedic floor for postoperative care. They were given PO and IV analgesics for pain control following their surgery. They were given 24 hours of postoperative antibiotics of  Anti-infectives (From admission, onward)    Start     Dose/Rate Route Frequency Ordered Stop   04/14/22 1700  ceFAZolin (ANCEF) IVPB 2g/100 mL premix        2 g 200 mL/hr over 30 Minutes Intravenous Every 6 hours 04/14/22 1356 04/15/22 0006   04/14/22 1445  doxycycline (VIBRA-TABS) tablet 100 mg  Status:  Discontinued        100 mg Oral 2 times daily 04/14/22 1356 04/14/22 1411   04/14/22 0815  ceFAZolin (ANCEF) IVPB 2g/100 mL premix        2 g 200 mL/hr over 30 Minutes Intravenous On call to O.R. 04/14/22 0810 04/14/22 1053      and started on DVT prophylaxis in the form of Xarelto.   PT and OT were ordered for total joint protocol. Discharge planning consulted to help with postop disposition and equipment needs.  Patient had a good night on the evening of surgery. They started to get up OOB with therapy on POD #0. Pt was seen during rounds and was ready to go home pending progress with therapy. She worked with therapy on POD #1 and was meeting her goals. Pt was discharged to home later that day in stable condition.  Diet: Regular diet Activity: WBAT Follow-up: in 2 weeks Disposition: Home Discharged Condition: stable   Discharge Instructions     Call MD / Call 911   Complete by: As directed    If you experience chest pain or shortness of breath, CALL 911 and be  transported to the hospital emergency room.  If you develope a fever above 101 F, pus (white drainage) or increased drainage or redness at the wound, or calf pain, call your surgeon's office.   Change dressing   Complete by: As directed    You may remove the bulky bandage (ACE wrap and gauze) two days after surgery. You will have an adhesive waterproof bandage underneath. Leave this in place until your first follow-up appointment.   Constipation Prevention   Complete by: As directed    Drink plenty of fluids.  Prune juice may be helpful.  You may use a stool softener, such as Colace (over the counter) 100 mg twice a day.  Use MiraLax (over the counter) for constipation as needed.   Diet - low sodium heart healthy   Complete by: As directed    Do not put a pillow under the knee. Place it under the heel.   Complete by: As directed    Driving restrictions   Complete by: As directed    No driving for two weeks   Post-operative opioid taper instructions:   Complete by: As directed    POST-OPERATIVE OPIOID TAPER INSTRUCTIONS: It is important to wean off of your opioid medication as soon as possible. If you do not need pain medication after your surgery it is ok to stop day one. Opioids include: Codeine, Hydrocodone(Norco, Vicodin), Oxycodone(Percocet, oxycontin) and hydromorphone amongst others.  Long term  and even short term use of opiods can cause: Increased pain response Dependence Constipation Depression Respiratory depression And more.  Withdrawal symptoms can include Flu like symptoms Nausea, vomiting And more Techniques to manage these symptoms Hydrate well Eat regular healthy meals Stay active Use relaxation techniques(deep breathing, meditating, yoga) Do Not substitute Alcohol to help with tapering If you have been on opioids for less than two weeks and do not have pain than it is ok to stop all together.  Plan to wean off of opioids This plan should start within one week  post op of your joint replacement. Maintain the same interval or time between taking each dose and first decrease the dose.  Cut the total daily intake of opioids by one tablet each day Next start to increase the time between doses. The last dose that should be eliminated is the evening dose.      TED hose   Complete by: As directed    Use stockings (TED hose) for three weeks on both leg(s).  You may remove them at night for sleeping.   Weight bearing as tolerated   Complete by: As directed       Allergies as of 04/15/2022   No Known Allergies      Medication List     STOP taking these medications    aspirin EC 81 MG tablet   benzonatate 100 MG capsule Commonly known as: TESSALON       TAKE these medications    albuterol 108 (90 Base) MCG/ACT inhaler Commonly known as: VENTOLIN HFA Inhale 2 puffs into the lungs every 6 (six) hours as needed for wheezing or shortness of breath.   amLODipine 10 MG tablet Commonly known as: NORVASC TAKE 1 TABLET BY MOUTH EVERY DAY What changed: when to take this   clonazePAM 0.5 MG tablet Commonly known as: KLONOPIN TAKE 1 TABLET BY MOUTH 2 TIMES DAILY.   docusate sodium 100 MG capsule Commonly known as: COLACE Take 100 mg by mouth 2 (two) times daily.   doxycycline 100 MG tablet Commonly known as: VIBRA-TABS Take 1 tablet (100 mg total) by mouth 2 (two) times daily.   fexofenadine 180 MG tablet Commonly known as: ALLEGRA TAKE 1 TABLET BY MOUTH EVERY DAY   gabapentin 300 MG capsule Commonly known as: NEURONTIN Take 1 capsule (300 mg total) by mouth as directed. Take a 300 mg capsule three times a day for two weeks following surgery.Then take a 300 mg capsule two times a day for two weeks. Then resume a 300 mg capsule once a day What changed: See the new instructions.   lansoprazole 30 MG capsule Commonly known as: PREVACID TAKE 1 CAPSULE BY MOUTH DAILY AT 12 NOON.   metFORMIN 500 MG tablet Commonly known as:  GLUCOPHAGE Take 1 tablet (500 mg total) by mouth 2 (two) times daily with a meal.   methocarbamol 500 MG tablet Commonly known as: ROBAXIN Take 1 tablet (500 mg total) by mouth every 6 (six) hours as needed for muscle spasms.   metoprolol succinate 100 MG 24 hr tablet Commonly known as: TOPROL-XL TAKE 1 TABLET BY MOUTH EVERY DAY EVERY EVENING WITH OR IMMEDIATELY FOLLOWING A MEAL   montelukast 10 MG tablet Commonly known as: SINGULAIR TAKE 1 TABLET BY MOUTH EVERYDAY AT BEDTIME   Mounjaro 2.5 MG/0.5ML Pen Generic drug: tirzepatide Inject 2.5 mg into the skin every Thursday.   ondansetron 4 MG disintegrating tablet Commonly known as: ZOFRAN-ODT TAKE 1 TABLET BY MOUTH EVERY 8  HOURS AS NEEDED *INS MAX 18 TABS PER 21 DAYS*   oxyCODONE 5 MG immediate release tablet Commonly known as: Oxy IR/ROXICODONE Take 1-2 tablets (5-10 mg total) by mouth every 6 (six) hours as needed for severe pain.   pantoprazole 40 MG tablet Commonly known as: PROTONIX TAKE 1 TABLET BY MOUTH TWICE A DAY   rivaroxaban 10 MG Tabs tablet Commonly known as: XARELTO Take 1 tablet (10 mg total) by mouth daily with breakfast for 20 days. Then resume one 81 mg aspirin once a day   rosuvastatin 10 MG tablet Commonly known as: CRESTOR TAKE 1 TABLET BY MOUTH EVERY DAY What changed: when to take this   SANDOSTATIN IJ Inject 1 Dose as directed every 30 (thirty) days.   scopolamine 1 MG/3DAYS Commonly known as: TRANSDERM-SCOP Place 1 patch (1.5 mg total) onto the skin every 3 (three) days. What changed:  when to take this reasons to take this   TURMERIC PO Take 1 capsule by mouth daily.   valACYclovir 1000 MG tablet Commonly known as: VALTREX Take 1,000 mg by mouth in the morning.   valsartan 320 MG tablet Commonly known as: DIOVAN TAKE 1 TABLET BY MOUTH EVERY DAY   venlafaxine XR 150 MG 24 hr capsule Commonly known as: EFFEXOR-XR TAKE 1 CAPSULE BY MOUTH EVERY DAY IN THE MORNING                Discharge Care Instructions  (From admission, onward)           Start     Ordered   04/15/22 0000  Weight bearing as tolerated        04/15/22 0704   04/15/22 0000  Change dressing       Comments: You may remove the bulky bandage (ACE wrap and gauze) two days after surgery. You will have an adhesive waterproof bandage underneath. Leave this in place until your first follow-up appointment.   04/15/22 [id number]            Follow-up Information     Gaynelle Arabian, MD Follow up in 2 week(s).   Specialty: Orthopedic Surgery Contact information: [address] [place] [address] [phone]                 Signed: R. Jaynie Bream, PA-C Orthopedic Surgery 04/16/2022, 8:28 AM

## 2022-04-17 ENCOUNTER — Encounter (HOSPITAL_COMMUNITY): Payer: Self-pay | Admitting: Orthopedic Surgery

## 2022-04-18 ENCOUNTER — Encounter: Payer: Self-pay | Admitting: Physician Assistant

## 2022-04-22 DIAGNOSIS — M25561 Pain in right knee: Secondary | ICD-10-CM | POA: Diagnosis not present

## 2022-04-24 ENCOUNTER — Other Ambulatory Visit: Payer: Self-pay | Admitting: Physician Assistant

## 2022-04-24 DIAGNOSIS — J301 Allergic rhinitis due to pollen: Secondary | ICD-10-CM

## 2022-05-13 ENCOUNTER — Inpatient Hospital Stay: Payer: BC Managed Care – PPO

## 2022-05-13 ENCOUNTER — Inpatient Hospital Stay: Payer: BC Managed Care – PPO | Attending: Hematology & Oncology | Admitting: Hematology & Oncology

## 2022-05-13 ENCOUNTER — Encounter: Payer: Self-pay | Admitting: Hematology & Oncology

## 2022-05-13 VITALS — BP 121/80 | HR 91 | Temp 98.2°F | Resp 20 | Ht 59.0 in | Wt 185.0 lb

## 2022-05-13 DIAGNOSIS — C7B8 Other secondary neuroendocrine tumors: Secondary | ICD-10-CM

## 2022-05-13 DIAGNOSIS — C7A00 Malignant carcinoid tumor of unspecified site: Secondary | ICD-10-CM | POA: Insufficient documentation

## 2022-05-13 DIAGNOSIS — C7B02 Secondary carcinoid tumors of liver: Secondary | ICD-10-CM | POA: Diagnosis not present

## 2022-05-13 LAB — CMP (CANCER CENTER ONLY)
ALT: 11 U/L (ref 0–44)
AST: 16 U/L (ref 15–41)
Albumin: 4.2 g/dL (ref 3.5–5.0)
Alkaline Phosphatase: 104 U/L (ref 38–126)
Anion gap: 12 (ref 5–15)
BUN: 17 mg/dL (ref 8–23)
CO2: 26 mmol/L (ref 22–32)
Calcium: 9.7 mg/dL (ref 8.9–10.3)
Chloride: 102 mmol/L (ref 98–111)
Creatinine: 0.79 mg/dL (ref 0.44–1.00)
GFR, Estimated: >60 mL/min (ref >60)
Glucose, Bld: 127 mg/dL — ABNORMAL HIGH (ref 70–99)
Potassium: 4.3 mmol/L (ref 3.5–5.1)
Sodium: 140 mmol/L (ref 135–145)
Total Bilirubin: 0.5 mg/dL (ref 0.3–1.2)
Total Protein: 7.9 g/dL (ref 6.5–8.1)

## 2022-05-13 LAB — CBC WITH DIFFERENTIAL (CANCER CENTER ONLY)
Abs Immature Granulocytes: 0.04 10*3/uL (ref 0.00–0.07)
Basophils Absolute: 0.1 10*3/uL (ref 0.0–0.1)
Basophils Relative: 1 %
Eosinophils Absolute: 0.2 10*3/uL (ref 0.0–0.5)
Eosinophils Relative: 2 %
HCT: 39.3 % (ref 36.0–46.0)
Hemoglobin: 12.6 g/dL (ref 12.0–15.0)
Immature Granulocytes: 0 %
Lymphocytes Relative: 32 %
Lymphs Abs: 3.6 10*3/uL (ref 0.7–4.0)
MCH: 29.4 pg (ref 26.0–34.0)
MCHC: 32.1 g/dL (ref 30.0–36.0)
MCV: 91.8 fL (ref 80.0–100.0)
Monocytes Absolute: 0.8 10*3/uL (ref 0.1–1.0)
Monocytes Relative: 7 %
Neutro Abs: 6.4 10*3/uL (ref 1.7–7.7)
Neutrophils Relative %: 58 %
Platelet Count: 346 10*3/uL (ref 150–400)
RBC: 4.28 MIL/uL (ref 3.87–5.11)
RDW: 15.2 % (ref 11.5–15.5)
WBC Count: 11.1 10*3/uL — ABNORMAL HIGH (ref 4.0–10.5)
nRBC: 0 % (ref 0.0–0.2)

## 2022-05-13 LAB — LACTATE DEHYDROGENASE: LDH: 164 U/L (ref 98–192)

## 2022-05-13 MED ORDER — LANREOTIDE ACETATE 120 MG/0.5ML ~~LOC~~ SOLN
120.0000 mg | Freq: Once | SUBCUTANEOUS | Status: AC
  Administered 2022-05-13: 120 mg via SUBCUTANEOUS
  Filled 2022-05-13: qty 120

## 2022-05-13 NOTE — Patient Instructions (Signed)
Lanreotide Injection What is this medication? LANREOTIDE (lan REE oh tide) treats high levels of growth hormone (acromegaly). It is used when other therapies have not worked well enough or cannot be tolerated. It works by reducing the amount of growth hormone your body makes. This reduces symptoms and the risk of health problems caused by too much growth hormone, such as diabetes and heart disease. It may also be used to treat neuroendocrine tumors, a cancer of the cells that release hormones and other substances in your body. It works by slowing down the release of these substances from the cells. This slows tumor growth. It also decreases the symptoms of carcinoid syndrome, such as flushing or diarrhea. This medicine may be used for other purposes; ask your health care provider or pharmacist if you have questions. COMMON BRAND NAME(S): Somatuline [place] What should I tell my care team before I take this medication? They need to know if you have any of these conditions: Diabetes Gallbladder disease Heart disease Kidney disease Liver disease Thyroid disease An unusual or allergic reaction to lanreotide, other medications, foods, dyes, or preservatives Pregnant or trying to get pregnant Breast-feeding How should I use this medication? This medication is injected under the skin. It is given by your care team in a hospital or clinic setting. Talk to your care team about the use of this medication in children. Special care may be needed. Overdosage: If you think you have taken too much of this medicine contact a poison control center or emergency room at once. NOTE: This medicine is only for you. Do not share this medicine with others. What if I miss a dose? Keep appointments for follow-up doses. It is important not to miss your dose. Call your care team if you are unable to keep an appointment. What may interact with this medication? Bromocriptine Cyclosporine Certain medications for blood  pressure, heart disease, irregular heartbeat Certain medications for diabetes Quinidine Terfenadine This list may not describe all possible interactions. Give your health care provider a list of all the medicines, herbs, non-prescription drugs, or dietary supplements you use. Also tell them if you smoke, drink alcohol, or use illegal drugs. Some items may interact with your medicine. What should I watch for while using this medication? Visit your care team for regular checks on your progress. Tell your care team if your symptoms do not start to get better or if they get worse. Your condition will be monitored carefully while you are receiving this medication. You may need blood work while you are taking this medication. This medication may increase blood sugar. The risk may be higher in patients who already have diabetes. Ask your care team what you can do to lower your risk of diabetes while taking this medication. Talk to your care team if you wish to become pregnant or think you may be pregnant. This medication can cause serious birth defects. Do not breast-feed while taking this medication and for 6 months after stopping therapy. This medication may cause infertility. Talk to your care team if you are concerned about your fertility. What side effects may I notice from receiving this medication? Side effects that you should report to your care team as soon as possible: Allergic reactions--skin rash, itching, hives, swelling of the face, lips, tongue, or throat Gallbladder problems--severe stomach pain, nausea, vomiting, fever High blood sugar (hyperglycemia)--increased thirst or amount of urine, unusual weakness or fatigue, blurry vision Increase in blood pressure Low blood sugar (hypoglycemia)--tremors or shaking, anxiety, sweating, cold   or clammy skin, confusion, dizziness, rapid heartbeat Low thyroid levels (hypothyroidism)--unusual weakness or fatigue, increased sensitivity to cold,  constipation, hair loss, dry skin, weight gain, feelings of depression Slow heartbeat--dizziness, feeling faint or lightheaded, confusion, trouble breathing, unusual weakness or fatigue Side effects that usually do not require medical attention (report to your care team if they continue or are bothersome): Diarrhea Dizziness Headache Muscle spasms Nausea Pain, redness, irritation, or bruising at the injection site Stomach pain This list may not describe all possible side effects. Call your doctor for medical advice about side effects. You may report side effects to FDA at 1-800-FDA-1088. Where should I keep my medication? This medication is given in a hospital or clinic. It will not be stored at home. NOTE: This sheet is a summary. It may not cover all possible information. If you have questions about this medicine, talk to your doctor, pharmacist, or health care provider.  2023 Elsevier/Gold Standard (2021-03-29 00:00:00)  

## 2022-05-13 NOTE — Progress Notes (Signed)
Hematology and Oncology Follow Up Visit  Leah [name] ZB:[id number] 1960-05-06 62 y.o. 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 Sibbett is here today for follow-up and Somatuline injection.  She is doing quite well.  She had knee surgery for her right knee a month ago.  She really is doing nicely.  She is doing physical therapy.  She comes in with her son.  She is not yet allowed to drive.  She has had no problems with diarrhea.  Her last chromogranin A level was 165.  She is her x-rays done at [facility].  I think she says that she is due for them this month or next month.  She has had no cough or shortness of breath.  She has had no leg swelling.  She has had no rashes.  There is been no bleeding.  Overall, I would say her performance status is probably ECOG 1.    Medications:  Allergies as of 05/13/2022   No Known Allergies      Medication List        Accurate as of May 13, 2022 10:33 AM. If you have any questions, ask your nurse or doctor.          STOP taking these medications    doxycycline 100 MG tablet Commonly known as: VIBRA-TABS Stopped by: [name], MD       TAKE these medications    albuterol 108 (90 Base) MCG/ACT inhaler Commonly known as: VENTOLIN HFA Inhale 2 puffs into the lungs every 6 (six) hours as needed for wheezing or shortness of breath.   amLODipine 10 MG tablet Commonly known as: NORVASC TAKE 1 TABLET BY MOUTH EVERY DAY What changed: when to take this   clonazePAM 0.5 MG tablet Commonly known as: KLONOPIN TAKE 1 TABLET BY MOUTH 2 TIMES DAILY.   docusate sodium 100 MG capsule Commonly known as: COLACE Take 100 mg by mouth 2 (two) times daily.   fexofenadine 180 MG tablet Commonly known as: ALLEGRA TAKE 1 TABLET BY MOUTH EVERY DAY   gabapentin  300 MG capsule Commonly known as: NEURONTIN Take 1 capsule (300 mg total) by mouth as directed. Take a 300 mg capsule three times a day for two weeks following surgery.Then take a 300 mg capsule two times a day for two weeks. Then resume a 300 mg capsule once a day   lansoprazole 30 MG capsule Commonly known as: PREVACID TAKE 1 CAPSULE BY MOUTH DAILY AT 12 NOON.   metFORMIN 500 MG tablet Commonly known as: GLUCOPHAGE Take 1 tablet (500 mg total) by mouth 2 (two) times daily with a meal.   methocarbamol 500 MG tablet Commonly known as: ROBAXIN Take 1 tablet (500 mg total) by mouth every 6 (six) hours as needed for muscle spasms.   metoprolol succinate 100 MG 24 hr tablet Commonly known as: TOPROL-XL TAKE 1 TABLET BY MOUTH EVERY DAY EVERY EVENING WITH OR IMMEDIATELY FOLLOWING A MEAL   montelukast 10 MG tablet Commonly known as: SINGULAIR TAKE 1 TABLET BY MOUTH EVERYDAY AT BEDTIME   Mounjaro 2.5 MG/0.5ML Pen Generic drug: tirzepatide Inject 2.5 mg into the skin every Thursday.   ondansetron 4 MG disintegrating tablet Commonly known as: ZOFRAN-ODT TAKE 1 TABLET BY MOUTH EVERY 8 HOURS AS NEEDED *INS MAX 18 TABS PER 21 DAYS*  oxyCODONE 5 MG immediate release tablet Commonly known as: Oxy IR/ROXICODONE Take 1-2 tablets (5-10 mg total) by mouth every 6 (six) hours as needed for severe pain.   pantoprazole 40 MG tablet Commonly known as: PROTONIX TAKE 1 TABLET BY MOUTH TWICE A DAY   rosuvastatin 10 MG tablet Commonly known as: CRESTOR TAKE 1 TABLET BY MOUTH EVERY DAY What changed: when to take this   SANDOSTATIN IJ Inject 1 Dose as directed every 30 (thirty) days.   scopolamine 1 MG/3DAYS Commonly known as: TRANSDERM-SCOP Place 1 patch (1.5 mg total) onto the skin every 3 (three) days.   TURMERIC PO Take 1 capsule by mouth daily.   valACYclovir 1000 MG tablet Commonly known as: VALTREX Take 1,000 mg by mouth in the morning.   valsartan 320 MG tablet Commonly known  as: DIOVAN TAKE 1 TABLET BY MOUTH EVERY DAY   venlafaxine XR 150 MG 24 hr capsule Commonly known as: EFFEXOR-XR TAKE 1 CAPSULE BY MOUTH EVERY DAY IN THE MORNING        Allergies: No Known Allergies  Past Medical History, Surgical history, Social history, and Family History were reviewed and updated.  Review of Systems: Review of Systems  Constitutional: Negative.   HENT: Negative.    Eyes: Negative.   Respiratory: Negative.    Cardiovascular: Negative.   Gastrointestinal: Negative.   Genitourinary: Negative.   Musculoskeletal: Negative.   Skin: Negative.   Neurological: Negative.   Endo/Heme/Allergies: Negative.   Psychiatric/Behavioral: Negative.       Physical Exam:  height is 4\' 11"  (1.499 m) and weight is 185 lb 0.6 oz (83.9 kg). Her oral temperature is 98.2 F (36.8 C). Her blood pressure is 121/80 and her pulse is 91. Her respiration is 20 and oxygen saturation is 98%.   Wt Readings from Last 3 Encounters:  05/13/22 185 lb 0.6 oz (83.9 kg)  04/14/22 180 lb (81.6 kg)  04/02/22 180 lb (81.6 kg)    Physical Exam Vitals reviewed.  HENT:     Head: Normocephalic and atraumatic.  Eyes:     Pupils: Pupils are equal, round, and reactive to light.  Cardiovascular:     Rate and Rhythm: Normal rate and regular rhythm.     Heart sounds: Normal heart sounds.  Pulmonary:     Effort: Pulmonary effort is normal.     Breath sounds: Normal breath sounds.  Abdominal:     General: Bowel sounds are normal.     Palpations: Abdomen is soft.     Comments: Abdominal exam shows an obese abdomen that is soft.  She has good bowel sounds.  There is no fluid wave.  She has laparotomy scars that are well-healed.  There is no tenderness.  There is no guarding or rebound.  There is no palpable liver or spleen tip.  Musculoskeletal:        General: No tenderness or deformity.     Cervical back: Normal range of motion.     Comments: She has a knee brace on the left knee.  There may be  a little bit of swelling in the left leg.  She does have limited range of motion of the left knee but again, much better than I would have thought.  Lymphadenopathy:     Cervical: No cervical adenopathy.  Skin:    General: Skin is warm and dry.     Findings: No erythema or rash.  Neurological:     Mental Status: She is alert and oriented to  person, place, and time.  Psychiatric:        Behavior: Behavior normal.        Thought Content: Thought content normal.        Judgment: Judgment normal.      Lab Results  Component Value Date   WBC 11.1 (H) 05/13/2022   HGB 12.6 05/13/2022   HCT 39.3 05/13/2022   MCV 91.8 05/13/2022   PLT 346 05/13/2022   Lab Results  Component Value Date   FERRITIN 382 (H) 10/18/2008   IRON <10 (L) 10/18/2008   TIBC NOT CALC Not calculated due to Iron <10. 10/18/2008   UIBC 156 10/18/2008   IRONPCTSAT NOT CALC Not calculated due to Iron <10. 10/18/2008   Lab Results  Component Value Date   RETICCTPCT 0.9 10/18/2008   RBC 4.28 05/13/2022   No results found for: "KPAFRELGTCHN", "LAMBDASER", "KAPLAMBRATIO" No results found for: "IGGSERUM", "IGA", "IGMSERUM" No results found for: "TOTALPROTELP", "ALBUMINELP", "A1GS", "A2GS", "BETS", "BETA2SER", "GAMS", "MSPIKE", "SPEI"   Chemistry      Component Value Date/Time   NA 140 05/13/2022 0907   NA 140 01/20/2017 1416   NA 141 01/17/2016 1047   K 4.3 05/13/2022 0907   K 3.9 01/20/2017 1416   K 3.2 (L) 01/17/2016 1047   CL 102 05/13/2022 0907   CL 102 01/20/2017 1416   CO2 26 05/13/2022 0907   CO2 26 01/20/2017 1416   CO2 27 01/17/2016 1047   BUN 17 05/13/2022 0907   BUN 15 01/20/2017 1416   BUN 6.6 (L) 01/17/2016 1047   CREATININE 0.79 05/13/2022 0907   CREATININE 1.1 01/20/2017 1416   CREATININE 0.7 01/17/2016 1047      Component Value Date/Time   CALCIUM 9.7 05/13/2022 0907   CALCIUM 9.1 01/20/2017 1416   CALCIUM 8.7 01/17/2016 1047   ALKPHOS 104 05/13/2022 0907   ALKPHOS 84 01/20/2017  1416   ALKPHOS 90 01/17/2016 1047   AST 16 05/13/2022 0907   AST 34 01/17/2016 1047   ALT 11 05/13/2022 0907   ALT 35 01/20/2017 1416   ALT 38 01/17/2016 1047   BILITOT 0.5 05/13/2022 0907   BILITOT 0.42 01/17/2016 1047       Impression and Plan: Ms. Barreca is a very pleasant 62 yo caucasian female with long standing history of metastatic carcinoid with recurrent liver metastasis.   I am glad that her knee surgery went so well.  She really looks quite good.  I am very impressed by this.  Again, she will get her studies done at [facility].  I think if we do find that she has progressive disease, we could certainly consider using Lutathera.  This was certainly be a viable treatment option for her I think.  As always, we will plan to get her back to get her Somatuline injections monthly.  I will plan to see her back in another 3 months.    [name], MD 4/2/202410:33 AM

## 2022-05-15 LAB — CHROMOGRANIN A: Chromogranin A (ng/mL): 120.4 ng/mL — ABNORMAL HIGH (ref 0.0–101.8)

## 2022-05-20 ENCOUNTER — Other Ambulatory Visit: Payer: Self-pay | Admitting: Physician Assistant

## 2022-05-20 DIAGNOSIS — Z5189 Encounter for other specified aftercare: Secondary | ICD-10-CM | POA: Diagnosis not present

## 2022-06-04 DIAGNOSIS — G4733 Obstructive sleep apnea (adult) (pediatric): Secondary | ICD-10-CM | POA: Diagnosis not present

## 2022-06-04 DIAGNOSIS — I10 Essential (primary) hypertension: Secondary | ICD-10-CM | POA: Diagnosis not present

## 2022-06-04 DIAGNOSIS — E782 Mixed hyperlipidemia: Secondary | ICD-10-CM | POA: Diagnosis not present

## 2022-06-04 DIAGNOSIS — Z9189 Other specified personal risk factors, not elsewhere classified: Secondary | ICD-10-CM | POA: Diagnosis not present

## 2022-06-04 DIAGNOSIS — E118 Type 2 diabetes mellitus with unspecified complications: Secondary | ICD-10-CM | POA: Diagnosis not present

## 2022-06-07 ENCOUNTER — Other Ambulatory Visit: Payer: Self-pay | Admitting: Physician Assistant

## 2022-06-14 ENCOUNTER — Other Ambulatory Visit: Payer: Self-pay | Admitting: Physician Assistant

## 2022-06-14 DIAGNOSIS — F419 Anxiety disorder, unspecified: Secondary | ICD-10-CM

## 2022-06-22 ENCOUNTER — Encounter: Payer: Self-pay | Admitting: Hematology & Oncology

## 2022-06-25 ENCOUNTER — Other Ambulatory Visit: Payer: Self-pay | Admitting: Physician Assistant

## 2022-06-25 DIAGNOSIS — F419 Anxiety disorder, unspecified: Secondary | ICD-10-CM

## 2022-06-25 NOTE — Telephone Encounter (Signed)
Please see note from pharmacy and advise

## 2022-06-26 ENCOUNTER — Inpatient Hospital Stay: Payer: BC Managed Care – PPO | Attending: Hematology & Oncology

## 2022-06-26 VITALS — BP 148/70 | HR 74 | Temp 98.7°F | Resp 18

## 2022-06-26 DIAGNOSIS — C7B02 Secondary carcinoid tumors of liver: Secondary | ICD-10-CM | POA: Insufficient documentation

## 2022-06-26 DIAGNOSIS — C7A00 Malignant carcinoid tumor of unspecified site: Secondary | ICD-10-CM | POA: Diagnosis not present

## 2022-06-26 DIAGNOSIS — C7B8 Other secondary neuroendocrine tumors: Secondary | ICD-10-CM

## 2022-06-26 MED ORDER — LANREOTIDE ACETATE 120 MG/0.5ML ~~LOC~~ SOLN
120.0000 mg | Freq: Once | SUBCUTANEOUS | Status: AC
  Administered 2022-06-26: 120 mg via SUBCUTANEOUS
  Filled 2022-06-26: qty 120

## 2022-06-26 NOTE — Patient Instructions (Signed)
Lanreotide Injection What is this medication? LANREOTIDE (lan REE oh tide) treats high levels of growth hormone (acromegaly). It is used when other therapies have not worked well enough or cannot be tolerated. It works by reducing the amount of growth hormone your body makes. This reduces symptoms and the risk of health problems caused by too much growth hormone, such as diabetes and heart disease. It may also be used to treat neuroendocrine tumors, a cancer of the cells that release hormones and other substances in your body. It works by slowing down the release of these substances from the cells. This slows tumor growth. It also decreases the symptoms of carcinoid syndrome, such as flushing or diarrhea. This medicine may be used for other purposes; ask your health care provider or pharmacist if you have questions. COMMON BRAND NAME(S): Somatuline [place] What should I tell my care team before I take this medication? They need to know if you have any of these conditions: Diabetes Gallbladder disease Heart disease Kidney disease Liver disease Thyroid disease An unusual or allergic reaction to lanreotide, other medications, foods, dyes, or preservatives Pregnant or trying to get pregnant Breast-feeding How should I use this medication? This medication is injected under the skin. It is given by your care team in a hospital or clinic setting. Talk to your care team about the use of this medication in children. Special care may be needed. Overdosage: If you think you have taken too much of this medicine contact a poison control center or emergency room at once. NOTE: This medicine is only for you. Do not share this medicine with others. What if I miss a dose? Keep appointments for follow-up doses. It is important not to miss your dose. Call your care team if you are unable to keep an appointment. What may interact with this medication? Bromocriptine Cyclosporine Certain medications for blood  pressure, heart disease, irregular heartbeat Certain medications for diabetes Quinidine Terfenadine This list may not describe all possible interactions. Give your health care provider a list of all the medicines, herbs, non-prescription drugs, or dietary supplements you use. Also tell them if you smoke, drink alcohol, or use illegal drugs. Some items may interact with your medicine. What should I watch for while using this medication? Visit your care team for regular checks on your progress. Tell your care team if your symptoms do not start to get better or if they get worse. Your condition will be monitored carefully while you are receiving this medication. You may need blood work while you are taking this medication. This medication may increase blood sugar. The risk may be higher in patients who already have diabetes. Ask your care team what you can do to lower your risk of diabetes while taking this medication. Talk to your care team if you wish to become pregnant or think you may be pregnant. This medication can cause serious birth defects. Do not breast-feed while taking this medication and for 6 months after stopping therapy. This medication may cause infertility. Talk to your care team if you are concerned about your fertility. What side effects may I notice from receiving this medication? Side effects that you should report to your care team as soon as possible: Allergic reactions--skin rash, itching, hives, swelling of the face, lips, tongue, or throat Gallbladder problems--severe stomach pain, nausea, vomiting, fever High blood sugar (hyperglycemia)--increased thirst or amount of urine, unusual weakness or fatigue, blurry vision Increase in blood pressure Low blood sugar (hypoglycemia)--tremors or shaking, anxiety, sweating, cold   or clammy skin, confusion, dizziness, rapid heartbeat Low thyroid levels (hypothyroidism)--unusual weakness or fatigue, increased sensitivity to cold,  constipation, hair loss, dry skin, weight gain, feelings of depression Slow heartbeat--dizziness, feeling faint or lightheaded, confusion, trouble breathing, unusual weakness or fatigue Side effects that usually do not require medical attention (report to your care team if they continue or are bothersome): Diarrhea Dizziness Headache Muscle spasms Nausea Pain, redness, irritation, or bruising at the injection site Stomach pain This list may not describe all possible side effects. Call your doctor for medical advice about side effects. You may report side effects to FDA at 1-800-FDA-1088. Where should I keep my medication? This medication is given in a hospital or clinic. It will not be stored at home. NOTE: This sheet is a summary. It may not cover all possible information. If you have questions about this medicine, talk to your doctor, pharmacist, or health care provider.  2023 Elsevier/Gold Standard (2021-03-29 00:00:00)  

## 2022-06-30 ENCOUNTER — Other Ambulatory Visit: Payer: Self-pay | Admitting: Physician Assistant

## 2022-06-30 ENCOUNTER — Encounter: Payer: Self-pay | Admitting: Physician Assistant

## 2022-06-30 DIAGNOSIS — R110 Nausea: Secondary | ICD-10-CM

## 2022-06-30 MED ORDER — SCOPOLAMINE 1 MG/3DAYS TD PT72: 1.0000 | MEDICATED_PATCH | TRANSDERMAL | 12 refills | Status: DC

## 2022-06-30 NOTE — Telephone Encounter (Signed)
Please see pt request for Rx and advise

## 2022-07-02 ENCOUNTER — Other Ambulatory Visit: Payer: Self-pay | Admitting: Physician Assistant

## 2022-07-16 ENCOUNTER — Ambulatory Visit (INDEPENDENT_AMBULATORY_CARE_PROVIDER_SITE_OTHER): Payer: BC Managed Care – PPO | Admitting: Sports Medicine

## 2022-07-16 ENCOUNTER — Ambulatory Visit (INDEPENDENT_AMBULATORY_CARE_PROVIDER_SITE_OTHER): Payer: BC Managed Care – PPO

## 2022-07-16 VITALS — Ht 59.0 in | Wt 177.0 lb

## 2022-07-16 DIAGNOSIS — E1169 Type 2 diabetes mellitus with other specified complication: Secondary | ICD-10-CM | POA: Diagnosis not present

## 2022-07-16 DIAGNOSIS — M25561 Pain in right knee: Secondary | ICD-10-CM

## 2022-07-16 DIAGNOSIS — G4733 Obstructive sleep apnea (adult) (pediatric): Secondary | ICD-10-CM | POA: Diagnosis not present

## 2022-07-16 DIAGNOSIS — M65311 Trigger thumb, right thumb: Secondary | ICD-10-CM

## 2022-07-16 DIAGNOSIS — M25551 Pain in right hip: Secondary | ICD-10-CM | POA: Diagnosis not present

## 2022-07-16 DIAGNOSIS — E782 Mixed hyperlipidemia: Secondary | ICD-10-CM | POA: Diagnosis not present

## 2022-07-16 DIAGNOSIS — M65312 Trigger thumb, left thumb: Secondary | ICD-10-CM | POA: Diagnosis not present

## 2022-07-16 DIAGNOSIS — Z471 Aftercare following joint replacement surgery: Secondary | ICD-10-CM | POA: Diagnosis not present

## 2022-07-16 NOTE — Progress Notes (Signed)
Leah [name] D.[name] [address] [place] [address] Phone: [phone]   Assessment and Plan:     1. Acute pain of right knee -Acute, uncomplicated, initial sports medicine visit - Patient has had 2 falls onto right knee since her total knee replacement on 04/14/2022 and was concerned that she may have damaged the replacement.  X-ray obtained in clinic today and hardware appears to be in place without acute change - May use Tylenol as needed for day-to-day pain relief -- DG Knee AP/LAT W/Sunrise Right; Future  2. Trigger finger of right thumb 3. Trigger finger of left thumb  -Chronic with exacerbation, subsequent visit - Recurrence of trigger finger of bilateral first digit.  Patient typically gets 5+ months relief from trigger finger CSI and is requesting bilateral trigger finger CSI today.  Tolerated well per note below.  CSI may temporarily increase blood glucose in patient with past medical history of DM type II  Procedure: Flexor Tendon Sheath Injection (Trigger Finger) Side: Bilateral 1st digit  Indication: Trigger Finger  After explaining the procedure, viable alternatives, risks, and answering any questions, consent was given verbally.  The site was cleaned with alcohol prep.  A steroid injection was performed under ultrasound guidance using 0.4ml of 1% lidocaine without epinephrine and 20 mg of Kenalog 40 with sterile technique.  This was well tolerated and resulted in  relief.  Needle was removed and dressing placed and post injection instructions were given including  a discussion of likely return of pain today after the anesthetic wears off (with the possibility of worsened pain) until the steroid starts to work in 3-5 days.  Procedure was repeated on contralateral side.  Pt was advised to call or return to clinic if these symptoms worsen or fail to improve as anticipated.  If not at least 50% better in 6 weeks would consider repeat  injection.   Pertinent previous records reviewed include none   Follow Up: As needed   Subjective:   I, Leah [name], am serving as a Neurosurgeon for Doctor Richardean Sale   Chief Complaint: right knee injection, bilateral thumb pain    HPI:    06/26/21 Patient is a 62 year old female complaining of right knee pain and bilateral thumb pain. Patient states that sometimes they wont bend at all its a click up and click down , been chronic hand pain but he clicking and locking has been going on for a couple of weeks, hx of right thumb fx , does get numbness and tingling, thumbs have been waking her up during the night , has been taking tylenol and using Voltaren gel and gabapentin,    09/25/2021 Patient states that a few weeks ago her knee was awful but now it is feeling better , hands are good , wants injection    03/03/2022 Patient states thumb pain is back , right knee is hurting a lot want CSI today    07/16/2022 Patient states CSI thumbs , fell a couple of weeks ago     Relevant Historical Information: History of stage IV liver cancer s/p resection, total left knee replacement, DM type II  Additional pertinent review of systems negative.   Current Outpatient Medications:    albuterol (VENTOLIN HFA) 108 (90 Base) MCG/ACT inhaler, Inhale 2 puffs into the lungs every 6 (six) hours as needed for wheezing or shortness of breath., Disp: 8 g, Rfl: 0   amLODipine (NORVASC) 10 MG tablet, TAKE 1 TABLET  BY MOUTH EVERY DAY (Patient taking differently: Take 10 mg by mouth at bedtime.), Disp: 90 tablet, Rfl: 3   clonazePAM (KLONOPIN) 0.5 MG tablet, TAKE 1 TABLET BY MOUTH 2 TIMES DAILY., Disp: 60 tablet, Rfl: 2   docusate sodium (COLACE) 100 MG capsule, Take 100 mg by mouth 2 (two) times daily., Disp: , Rfl:    fexofenadine (ALLEGRA) 180 MG tablet, TAKE 1 TABLET BY MOUTH EVERY DAY, Disp: 90 tablet, Rfl: 3   gabapentin (NEURONTIN) 300 MG capsule, Take 1 capsule (300 mg total) by mouth as directed.  Take a 300 mg capsule three times a day for two weeks following surgery.Then take a 300 mg capsule two times a day for two weeks. Then resume a 300 mg capsule once a day, Disp: 84 capsule, Rfl: 0   lansoprazole (PREVACID) 30 MG capsule, TAKE 1 CAPSULE BY MOUTH DAILY AT 12 NOON., Disp: 30 capsule, Rfl: 0   metFORMIN (GLUCOPHAGE) 500 MG tablet, Take 1 tablet (500 mg total) by mouth 2 (two) times daily with a meal., Disp: 180 tablet, Rfl: 3   methocarbamol (ROBAXIN) 500 MG tablet, Take 1 tablet (500 mg total) by mouth every 6 (six) hours as needed for muscle spasms., Disp: 40 tablet, Rfl: 0   metoprolol succinate (TOPROL-XL) 100 MG 24 hr tablet, TAKE 1 TABLET BY MOUTH EVERY DAY EVERY EVENING WITH OR IMMEDIATELY FOLLOWING A MEAL, Disp: 90 tablet, Rfl: 1   montelukast (SINGULAIR) 10 MG tablet, TAKE 1 TABLET BY MOUTH EVERYDAY AT BEDTIME, Disp: 90 tablet, Rfl: 2   Octreotide Acetate (SANDOSTATIN IJ), Inject 1 Dose as directed every 30 (thirty) days., Disp: , Rfl:    ondansetron (ZOFRAN-ODT) 4 MG disintegrating tablet, TAKE 1 TABLET BY MOUTH EVERY 8 HOURS AS NEEDED *INS MAX 18 TABS PER 21 DAYS*, Disp: 18 tablet, Rfl: 3   oxyCODONE (OXY IR/ROXICODONE) 5 MG immediate release tablet, Take 1-2 tablets (5-10 mg total) by mouth every 6 (six) hours as needed for severe pain., Disp: 42 tablet, Rfl: 0   pantoprazole (PROTONIX) 40 MG tablet, TAKE 1 TABLET BY MOUTH TWICE A DAY, Disp: 180 tablet, Rfl: 0   rosuvastatin (CRESTOR) 10 MG tablet, TAKE 1 TABLET BY MOUTH EVERY DAY, Disp: 90 tablet, Rfl: 0   scopolamine (TRANSDERM-SCOP) 1 MG/3DAYS, Place 1 patch (1.5 mg total) onto the skin every 3 (three) days., Disp: 10 patch, Rfl: 12   tirzepatide (MOUNJARO) 2.5 MG/0.5ML Pen, Inject 2.5 mg into the skin every Thursday., Disp: , Rfl:    TURMERIC PO, Take 1 capsule by mouth daily., Disp: , Rfl:    valACYclovir (VALTREX) 1000 MG tablet, Take 1,000 mg by mouth in the morning., Disp: , Rfl:    valsartan (DIOVAN) 320 MG tablet,  TAKE 1 TABLET BY MOUTH EVERY DAY, Disp: 90 tablet, Rfl: 0   venlafaxine XR (EFFEXOR-XR) 150 MG 24 hr capsule, TAKE 1 CAPSULE BY MOUTH EVERY DAY IN THE MORNING, Disp: 90 capsule, Rfl: 1   Objective:     Vitals:   07/16/22 1033  Weight: 177 lb (80.3 kg)  Height: 4\' 11"  (1.499 m)      Body mass index is 35.75 kg/m.    Physical Exam:     General:  awake, alert oriented, no acute distress nontoxic Skin: no suspicious lesions or rashes Neuro:sensation intact, no deficits, strength 5/5 with no deficits, no atrophy, normal muscle tone Psych: No signs of anxiety, depression or other mood disorder   Right knee: No swelling No deformity Neg fluid wave, joint  milking ROM Flex 100, Ext 10 NTTP over the quad tendon, medial fem condyle, lat fem condyle, patella, plica, patella tendon, tibial tuberostiy, fibular head, posterior fossa, pes anserine bursa, gerdy's tubercle,   Gait normal  Bilateral hand/wrist:  No deformity or swelling appreciated. TTP first MCP with palpable nodule   nttp over the snauff box, dorsal carpals, volar carpals, radial styloid, ulnar styloid,  tfcc Partial triggering of   thumb  with flexion and extension Negative Tinel's Negative finklestein Neg tfcc bounce test    Electronically signed by:  Leah [name] D.[name] 11:10 AM 07/16/22

## 2022-07-17 ENCOUNTER — Inpatient Hospital Stay: Payer: BC Managed Care – PPO

## 2022-07-24 ENCOUNTER — Inpatient Hospital Stay: Payer: BC Managed Care – PPO | Attending: Hematology & Oncology

## 2022-07-24 VITALS — BP 143/84 | HR 78 | Temp 98.6°F | Resp 17

## 2022-07-24 DIAGNOSIS — C7B02 Secondary carcinoid tumors of liver: Secondary | ICD-10-CM | POA: Insufficient documentation

## 2022-07-24 DIAGNOSIS — C7B8 Other secondary neuroendocrine tumors: Secondary | ICD-10-CM

## 2022-07-24 DIAGNOSIS — C7A00 Malignant carcinoid tumor of unspecified site: Secondary | ICD-10-CM | POA: Insufficient documentation

## 2022-07-24 MED ORDER — LANREOTIDE ACETATE 120 MG/0.5ML ~~LOC~~ SOLN
120.0000 mg | Freq: Once | SUBCUTANEOUS | Status: AC
  Administered 2022-07-24: 120 mg via SUBCUTANEOUS
  Filled 2022-07-24: qty 120

## 2022-07-24 NOTE — Patient Instructions (Signed)
Lanreotide Injection What is this medication? LANREOTIDE (lan REE oh tide) treats high levels of growth hormone (acromegaly). It is used when other therapies have not worked well enough or cannot be tolerated. It works by reducing the amount of growth hormone your body makes. This reduces symptoms and the risk of health problems caused by too much growth hormone, such as diabetes and heart disease. It may also be used to treat neuroendocrine tumors, a cancer of the cells that release hormones and other substances in your body. It works by slowing down the release of these substances from the cells. This slows tumor growth. It also decreases the symptoms of carcinoid syndrome, such as flushing or diarrhea. This medicine may be used for other purposes; ask your health care provider or pharmacist if you have questions. COMMON BRAND NAME(S): Somatuline [place] What should I tell my care team before I take this medication? They need to know if you have any of these conditions: Diabetes Gallbladder disease Heart disease Kidney disease Liver disease Thyroid disease An unusual or allergic reaction to lanreotide, other medications, foods, dyes, or preservatives Pregnant or trying to get pregnant Breast-feeding How should I use this medication? This medication is injected under the skin. It is given by your care team in a hospital or clinic setting. Talk to your care team about the use of this medication in children. Special care may be needed. Overdosage: If you think you have taken too much of this medicine contact a poison control center or emergency room at once. NOTE: This medicine is only for you. Do not share this medicine with others. What if I miss a dose? Keep appointments for follow-up doses. It is important not to miss your dose. Call your care team if you are unable to keep an appointment. What may interact with this medication? Bromocriptine Cyclosporine Certain medications for blood  pressure, heart disease, irregular heartbeat Certain medications for diabetes Quinidine Terfenadine This list may not describe all possible interactions. Give your health care provider a list of all the medicines, herbs, non-prescription drugs, or dietary supplements you use. Also tell them if you smoke, drink alcohol, or use illegal drugs. Some items may interact with your medicine. What should I watch for while using this medication? Visit your care team for regular checks on your progress. Tell your care team if your symptoms do not start to get better or if they get worse. Your condition will be monitored carefully while you are receiving this medication. You may need blood work while you are taking this medication. This medication may increase blood sugar. The risk may be higher in patients who already have diabetes. Ask your care team what you can do to lower your risk of diabetes while taking this medication. Talk to your care team if you wish to become pregnant or think you may be pregnant. This medication can cause serious birth defects. Do not breast-feed while taking this medication and for 6 months after stopping therapy. This medication may cause infertility. Talk to your care team if you are concerned about your fertility. What side effects may I notice from receiving this medication? Side effects that you should report to your care team as soon as possible: Allergic reactions--skin rash, itching, hives, swelling of the face, lips, tongue, or throat Gallbladder problems--severe stomach pain, nausea, vomiting, fever High blood sugar (hyperglycemia)--increased thirst or amount of urine, unusual weakness or fatigue, blurry vision Increase in blood pressure Low blood sugar (hypoglycemia)--tremors or shaking, anxiety, sweating, cold   or clammy skin, confusion, dizziness, rapid heartbeat Low thyroid levels (hypothyroidism)--unusual weakness or fatigue, increased sensitivity to cold,  constipation, hair loss, dry skin, weight gain, feelings of depression Slow heartbeat--dizziness, feeling faint or lightheaded, confusion, trouble breathing, unusual weakness or fatigue Side effects that usually do not require medical attention (report to your care team if they continue or are bothersome): Diarrhea Dizziness Headache Muscle spasms Nausea Pain, redness, irritation, or bruising at the injection site Stomach pain This list may not describe all possible side effects. Call your doctor for medical advice about side effects. You may report side effects to FDA at 1-800-FDA-1088. Where should I keep my medication? This medication is given in a hospital or clinic. It will not be stored at home. NOTE: This sheet is a summary. It may not cover all possible information. If you have questions about this medicine, talk to your doctor, pharmacist, or health care provider.  2024 Elsevier/Gold Standard (2021-06-19 00:00:00)  

## 2022-08-12 ENCOUNTER — Ambulatory Visit: Payer: BC Managed Care – PPO

## 2022-08-12 ENCOUNTER — Other Ambulatory Visit: Payer: BC Managed Care – PPO

## 2022-08-12 ENCOUNTER — Ambulatory Visit: Payer: BC Managed Care – PPO | Admitting: Hematology & Oncology

## 2022-08-21 ENCOUNTER — Inpatient Hospital Stay: Payer: BC Managed Care – PPO

## 2022-08-21 ENCOUNTER — Inpatient Hospital Stay: Payer: BC Managed Care – PPO | Attending: Hematology & Oncology

## 2022-08-21 ENCOUNTER — Other Ambulatory Visit: Payer: Self-pay

## 2022-08-21 ENCOUNTER — Encounter: Payer: Self-pay | Admitting: Medical Oncology

## 2022-08-21 ENCOUNTER — Inpatient Hospital Stay (HOSPITAL_BASED_OUTPATIENT_CLINIC_OR_DEPARTMENT_OTHER): Payer: BC Managed Care – PPO | Admitting: Medical Oncology

## 2022-08-21 VITALS — BP 110/80 | HR 75 | Temp 98.4°F | Resp 18 | Ht 59.0 in | Wt 173.1 lb

## 2022-08-21 DIAGNOSIS — C7A00 Malignant carcinoid tumor of unspecified site: Secondary | ICD-10-CM | POA: Diagnosis not present

## 2022-08-21 DIAGNOSIS — C7B8 Other secondary neuroendocrine tumors: Secondary | ICD-10-CM

## 2022-08-21 DIAGNOSIS — C7B02 Secondary carcinoid tumors of liver: Secondary | ICD-10-CM | POA: Insufficient documentation

## 2022-08-21 LAB — CBC WITH DIFFERENTIAL (CANCER CENTER ONLY)
Abs Immature Granulocytes: 0.03 10*3/uL (ref 0.00–0.07)
Basophils Absolute: 0.1 10*3/uL (ref 0.0–0.1)
Basophils Relative: 0 %
Eosinophils Absolute: 0.3 10*3/uL (ref 0.0–0.5)
Eosinophils Relative: 3 %
HCT: 39.9 % (ref 36.0–46.0)
Hemoglobin: 13 g/dL (ref 12.0–15.0)
Immature Granulocytes: 0 %
Lymphocytes Relative: 23 %
Lymphs Abs: 2.8 10*3/uL (ref 0.7–4.0)
MCH: 29 pg (ref 26.0–34.0)
MCHC: 32.6 g/dL (ref 30.0–36.0)
MCV: 89.1 fL (ref 80.0–100.0)
Monocytes Absolute: 0.7 10*3/uL (ref 0.1–1.0)
Monocytes Relative: 6 %
Neutro Abs: 8.2 10*3/uL — ABNORMAL HIGH (ref 1.7–7.7)
Neutrophils Relative %: 68 %
Platelet Count: 264 10*3/uL (ref 150–400)
RBC: 4.48 MIL/uL (ref 3.87–5.11)
RDW: 15 % (ref 11.5–15.5)
WBC Count: 12.1 10*3/uL — ABNORMAL HIGH (ref 4.0–10.5)
nRBC: 0 % (ref 0.0–0.2)

## 2022-08-21 LAB — CMP (CANCER CENTER ONLY)
ALT: 12 U/L (ref 0–44)
AST: 14 U/L — ABNORMAL LOW (ref 15–41)
Albumin: 4.3 g/dL (ref 3.5–5.0)
Alkaline Phosphatase: 78 U/L (ref 38–126)
Anion gap: 10 (ref 5–15)
BUN: 13 mg/dL (ref 8–23)
CO2: 24 mmol/L (ref 22–32)
Calcium: 9.6 mg/dL (ref 8.9–10.3)
Chloride: 103 mmol/L (ref 98–111)
Creatinine: 0.64 mg/dL (ref 0.44–1.00)
GFR, Estimated: >60 mL/min (ref >60)
Glucose, Bld: 112 mg/dL — ABNORMAL HIGH (ref 70–99)
Potassium: 4 mmol/L (ref 3.5–5.1)
Sodium: 137 mmol/L (ref 135–145)
Total Bilirubin: 0.5 mg/dL (ref 0.3–1.2)
Total Protein: 8 g/dL (ref 6.5–8.1)

## 2022-08-21 LAB — LACTATE DEHYDROGENASE: LDH: 144 U/L (ref 98–192)

## 2022-08-21 MED ORDER — LANREOTIDE ACETATE 120 MG/0.5ML ~~LOC~~ SOLN
120.0000 mg | Freq: Once | SUBCUTANEOUS | Status: AC
  Administered 2022-08-21: 120 mg via SUBCUTANEOUS
  Filled 2022-08-21: qty 120

## 2022-08-21 NOTE — Progress Notes (Signed)
Hematology and Oncology Follow Up Visit  Leah [name] [id number] 03-29-1960 62 y.o. 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  She is doing quite well.  She is healing well from her repeat right knee placement.   She has had no problems with diarrhea but she does find that she has to watch her diet to avoid side effects.   Her last chromogranin A level was 120.4 which is down from 165.5  She is her MRI's done at [facility].  Her last was on 01/06/2022. She will be due for her next in Nov 2024.   She has had no cough or shortness of breath.  She has had no leg swelling.  She has had no rashes.  There is been no bleeding. She has lost weight secondary to stress from her husband's recent surgery and illness.   Overall, I would say her performance status is probably ECOG 1.    Wt Readings from Last 3 Encounters:  08/21/22 173 lb 1.9 oz (78.5 kg)  07/16/22 177 lb (80.3 kg)  05/13/22 185 lb 0.6 oz (83.9 kg)   Medications:  Allergies as of 08/21/2022   No Known Allergies      Medication List        Accurate as of August 21, 2022 11:08 AM. If you have any questions, ask your nurse or doctor.          albuterol 108 (90 Base) MCG/ACT inhaler Commonly known as: VENTOLIN HFA Inhale 2 puffs into the lungs every 6 (six) hours as needed for wheezing or shortness of breath.   amLODipine 10 MG tablet Commonly known as: NORVASC TAKE 1 TABLET BY MOUTH EVERY DAY What changed: when to take this   clonazePAM 0.5 MG tablet Commonly known as: KLONOPIN TAKE 1 TABLET BY MOUTH 2 TIMES DAILY.   docusate sodium 100 MG capsule Commonly known as: COLACE Take 100 mg by mouth 2 (two) times daily.   fexofenadine 180 MG tablet Commonly known as:  ALLEGRA TAKE 1 TABLET BY MOUTH EVERY DAY   gabapentin 300 MG capsule Commonly known as: NEURONTIN Take 1 capsule (300 mg total) by mouth as directed. Take a 300 mg capsule three times a day for two weeks following surgery.Then take a 300 mg capsule two times a day for two weeks. Then resume a 300 mg capsule once a day   lansoprazole 30 MG capsule Commonly known as: PREVACID TAKE 1 CAPSULE BY MOUTH DAILY AT 12 NOON.   metFORMIN 500 MG tablet Commonly known as: GLUCOPHAGE Take 1 tablet (500 mg total) by mouth 2 (two) times daily with a meal.   methocarbamol 500 MG tablet Commonly known as: ROBAXIN Take 1 tablet (500 mg total) by mouth every 6 (six) hours as needed for muscle spasms.   metoprolol succinate 100 MG 24 hr tablet Commonly known as: TOPROL-XL TAKE 1 TABLET BY MOUTH EVERY DAY EVERY EVENING WITH OR IMMEDIATELY FOLLOWING A MEAL   montelukast 10 MG tablet Commonly known as: SINGULAIR TAKE 1 TABLET BY MOUTH EVERYDAY AT BEDTIME   Mounjaro 2.5 MG/0.5ML Pen Generic drug: tirzepatide Inject 2.5 mg into the skin every Thursday.   ondansetron 4 MG disintegrating tablet Commonly known as: ZOFRAN-ODT TAKE 1 TABLET BY MOUTH EVERY 8 HOURS AS NEEDED *INS MAX  18 TABS PER 21 DAYS*   oxyCODONE 5 MG immediate release tablet Commonly known as: Oxy IR/ROXICODONE Take 1-2 tablets (5-10 mg total) by mouth every 6 (six) hours as needed for severe pain.   pantoprazole 40 MG tablet Commonly known as: PROTONIX TAKE 1 TABLET BY MOUTH TWICE A DAY   rosuvastatin 10 MG tablet Commonly known as: CRESTOR TAKE 1 TABLET BY MOUTH EVERY DAY   SANDOSTATIN IJ Inject 1 Dose as directed every 30 (thirty) days.   scopolamine 1 MG/3DAYS Commonly known as: TRANSDERM-SCOP Place 1 patch (1.5 mg total) onto the skin every 3 (three) days.   TURMERIC PO Take 1 capsule by mouth daily.   valACYclovir 1000 MG tablet Commonly known as: VALTREX Take 1,000 mg by mouth in the morning.   valsartan 320 MG  tablet Commonly known as: DIOVAN TAKE 1 TABLET BY MOUTH EVERY DAY   venlafaxine XR 150 MG 24 hr capsule Commonly known as: EFFEXOR-XR TAKE 1 CAPSULE BY MOUTH EVERY DAY IN THE MORNING        Allergies: No Known Allergies  Past Medical History, Surgical history, Social history, and Family History were reviewed and updated.  Review of Systems: Review of Systems  Constitutional: Negative.   HENT: Negative.    Eyes: Negative.   Respiratory: Negative.    Cardiovascular: Negative.   Gastrointestinal: Negative.   Genitourinary: Negative.   Musculoskeletal: Negative.   Skin: Negative.   Neurological: Negative.   Endo/Heme/Allergies: Negative.   Psychiatric/Behavioral: Negative.     Physical Exam:  height is 4\' 11"  (1.499 m) and weight is 173 lb 1.9 oz (78.5 kg). Her oral temperature is 98.4 F (36.9 C). Her blood pressure is 110/80 and her pulse is 75. Her respiration is 18 and oxygen saturation is 98%.   Wt Readings from Last 3 Encounters:  08/21/22 173 lb 1.9 oz (78.5 kg)  07/16/22 177 lb (80.3 kg)  05/13/22 185 lb 0.6 oz (83.9 kg)    Physical Exam Vitals reviewed.  HENT:     Head: Normocephalic and atraumatic.  Eyes:     Pupils: Pupils are equal, round, and reactive to light.  Cardiovascular:     Rate and Rhythm: Normal rate and regular rhythm.     Heart sounds: Normal heart sounds.  Pulmonary:     Effort: Pulmonary effort is normal.     Breath sounds: Normal breath sounds.  Abdominal:     General: Bowel sounds are normal.     Palpations: Abdomen is soft.     Comments: Abdominal exam shows an obese abdomen that is soft.  She has good bowel sounds.  There is no fluid wave. There is no tenderness.  There is no guarding or rebound.  There is no palpable liver or spleen tip.  Musculoskeletal:        General: No tenderness or deformity.     Cervical back: Normal range of motion.  Lymphadenopathy:     Cervical: No cervical adenopathy.  Skin:    General: Skin is warm  and dry.     Findings: No erythema or rash.  Neurological:     Mental Status: She is alert and oriented to person, place, and time.  Psychiatric:        Behavior: Behavior normal.        Thought Content: Thought content normal.        Judgment: Judgment normal.      Lab Results  Component Value Date   WBC 12.1 (H) 08/21/2022  HGB 13.0 08/21/2022   HCT 39.9 08/21/2022   MCV 89.1 08/21/2022   PLT 264 08/21/2022   Lab Results  Component Value Date   FERRITIN 382 (H) 10/18/2008   IRON <10 (L) 10/18/2008   TIBC NOT CALC Not calculated due to Iron <10. 10/18/2008   UIBC 156 10/18/2008   IRONPCTSAT NOT CALC Not calculated due to Iron <10. 10/18/2008   Lab Results  Component Value Date   RETICCTPCT 0.9 10/18/2008   RBC 4.48 08/21/2022   No results found for: "KPAFRELGTCHN", "LAMBDASER", "KAPLAMBRATIO" No results found for: "IGGSERUM", "IGA", "IGMSERUM" No results found for: "TOTALPROTELP", "ALBUMINELP", "A1GS", "A2GS", "BETS", "BETA2SER", "GAMS", "MSPIKE", "SPEI"   Chemistry      Component Value Date/Time   NA 137 08/21/2022 1016   NA 140 01/20/2017 1416   NA 141 01/17/2016 1047   K 4.0 08/21/2022 1016   K 3.9 01/20/2017 1416   K 3.2 (L) 01/17/2016 1047   CL 103 08/21/2022 1016   CL 102 01/20/2017 1416   CO2 24 08/21/2022 1016   CO2 26 01/20/2017 1416   CO2 27 01/17/2016 1047   BUN 13 08/21/2022 1016   BUN 15 01/20/2017 1416   BUN 6.6 (L) 01/17/2016 1047   CREATININE 0.64 08/21/2022 1016   CREATININE 1.1 01/20/2017 1416   CREATININE 0.7 01/17/2016 1047      Component Value Date/Time   CALCIUM 9.6 08/21/2022 1016   CALCIUM 9.1 01/20/2017 1416   CALCIUM 8.7 01/17/2016 1047   ALKPHOS 78 08/21/2022 1016   ALKPHOS 84 01/20/2017 1416   ALKPHOS 90 01/17/2016 1047   AST 14 (L) 08/21/2022 1016   AST 34 01/17/2016 1047   ALT 12 08/21/2022 1016   ALT 35 01/20/2017 1416   ALT 38 01/17/2016 1047   BILITOT 0.5 08/21/2022 1016   BILITOT 0.42 01/17/2016 1047       Encounter Diagnosis  Name Primary?   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 [facility]) Yes    Impression and Plan: Ms. Vanschaick is a very pleasant 62 yo caucasian female with long standing history of metastatic carcinoid with recurrent liver metastasis.   Again, she will get her studies done at [facility] as she has been doing.   Per Dr. Wilfred Lacy last note, if she has progressive disease, we could certainly consider using Lutathera.   As always, we will plan to get her back to get her Somatuline injections monthly.  I will plan to see her back in another 3 months.  Disposition: RTC monthly x 2 injection only RTC 3 months MD, Labs ( LDH, CBC w/, CMP, Chromogranin A)-[place]   Rushie Chestnut, PA-C 7/11/202411:08 AM

## 2022-08-21 NOTE — Patient Instructions (Signed)
Lanreotide Injection What is this medication? LANREOTIDE (lan REE oh tide) treats high levels of growth hormone (acromegaly). It is used when other therapies have not worked well enough or cannot be tolerated. It works by reducing the amount of growth hormone your body makes. This reduces symptoms and the risk of health problems caused by too much growth hormone, such as diabetes and heart disease. It may also be used to treat neuroendocrine tumors, a cancer of the cells that release hormones and other substances in your body. It works by slowing down the release of these substances from the cells. This slows tumor growth. It also decreases the symptoms of carcinoid syndrome, such as flushing or diarrhea. This medicine may be used for other purposes; ask your health care provider or pharmacist if you have questions. COMMON BRAND NAME(S): Somatuline [place] What should I tell my care team before I take this medication? They need to know if you have any of these conditions: Diabetes Gallbladder disease Heart disease Kidney disease Liver disease Thyroid disease An unusual or allergic reaction to lanreotide, other medications, foods, dyes, or preservatives Pregnant or trying to get pregnant Breast-feeding How should I use this medication? This medication is injected under the skin. It is given by your care team in a hospital or clinic setting. Talk to your care team about the use of this medication in children. Special care may be needed. Overdosage: If you think you have taken too much of this medicine contact a poison control center or emergency room at once. NOTE: This medicine is only for you. Do not share this medicine with others. What if I miss a dose? Keep appointments for follow-up doses. It is important not to miss your dose. Call your care team if you are unable to keep an appointment. What may interact with this medication? Bromocriptine Cyclosporine Certain medications for blood  pressure, heart disease, irregular heartbeat Certain medications for diabetes Quinidine Terfenadine This list may not describe all possible interactions. Give your health care provider a list of all the medicines, herbs, non-prescription drugs, or dietary supplements you use. Also tell them if you smoke, drink alcohol, or use illegal drugs. Some items may interact with your medicine. What should I watch for while using this medication? Visit your care team for regular checks on your progress. Tell your care team if your symptoms do not start to get better or if they get worse. Your condition will be monitored carefully while you are receiving this medication. You may need blood work while you are taking this medication. This medication may increase blood sugar. The risk may be higher in patients who already have diabetes. Ask your care team what you can do to lower your risk of diabetes while taking this medication. Talk to your care team if you wish to become pregnant or think you may be pregnant. This medication can cause serious birth defects. Do not breast-feed while taking this medication and for 6 months after stopping therapy. This medication may cause infertility. Talk to your care team if you are concerned about your fertility. What side effects may I notice from receiving this medication? Side effects that you should report to your care team as soon as possible: Allergic reactions--skin rash, itching, hives, swelling of the face, lips, tongue, or throat Gallbladder problems--severe stomach pain, nausea, vomiting, fever High blood sugar (hyperglycemia)--increased thirst or amount of urine, unusual weakness or fatigue, blurry vision Increase in blood pressure Low blood sugar (hypoglycemia)--tremors or shaking, anxiety, sweating, cold   or clammy skin, confusion, dizziness, rapid heartbeat Low thyroid levels (hypothyroidism)--unusual weakness or fatigue, increased sensitivity to cold,  constipation, hair loss, dry skin, weight gain, feelings of depression Slow heartbeat--dizziness, feeling faint or lightheaded, confusion, trouble breathing, unusual weakness or fatigue Side effects that usually do not require medical attention (report to your care team if they continue or are bothersome): Diarrhea Dizziness Headache Muscle spasms Nausea Pain, redness, irritation, or bruising at the injection site Stomach pain This list may not describe all possible side effects. Call your doctor for medical advice about side effects. You may report side effects to FDA at 1-800-FDA-1088. Where should I keep my medication? This medication is given in a hospital or clinic. It will not be stored at home. NOTE: This sheet is a summary. It may not cover all possible information. If you have questions about this medicine, talk to your doctor, pharmacist, or health care provider.  2024 Elsevier/Gold Standard (2021-06-19 00:00:00)  

## 2022-08-22 ENCOUNTER — Other Ambulatory Visit: Payer: Self-pay | Admitting: Physician Assistant

## 2022-08-22 LAB — CHROMOGRANIN A: Chromogranin A (ng/mL): 75.9 ng/mL (ref 0.0–101.8)

## 2022-08-28 ENCOUNTER — Encounter: Payer: Self-pay | Admitting: Hematology & Oncology

## 2022-09-01 ENCOUNTER — Encounter: Payer: Self-pay | Admitting: Physician Assistant

## 2022-09-02 ENCOUNTER — Other Ambulatory Visit: Payer: Self-pay | Admitting: Physician Assistant

## 2022-09-02 NOTE — Telephone Encounter (Signed)
Please advise 

## 2022-09-03 ENCOUNTER — Other Ambulatory Visit: Payer: Self-pay

## 2022-09-03 DIAGNOSIS — Z9189 Other specified personal risk factors, not elsewhere classified: Secondary | ICD-10-CM | POA: Diagnosis not present

## 2022-09-03 DIAGNOSIS — E669 Obesity, unspecified: Secondary | ICD-10-CM | POA: Diagnosis not present

## 2022-09-03 DIAGNOSIS — G4733 Obstructive sleep apnea (adult) (pediatric): Secondary | ICD-10-CM | POA: Diagnosis not present

## 2022-09-03 DIAGNOSIS — E1169 Type 2 diabetes mellitus with other specified complication: Secondary | ICD-10-CM | POA: Diagnosis not present

## 2022-09-03 DIAGNOSIS — M1711 Unilateral primary osteoarthritis, right knee: Secondary | ICD-10-CM

## 2022-09-03 DIAGNOSIS — E782 Mixed hyperlipidemia: Secondary | ICD-10-CM | POA: Diagnosis not present

## 2022-09-06 ENCOUNTER — Other Ambulatory Visit: Payer: Self-pay | Admitting: Physician Assistant

## 2022-09-06 DIAGNOSIS — I10 Essential (primary) hypertension: Secondary | ICD-10-CM

## 2022-09-10 ENCOUNTER — Other Ambulatory Visit: Payer: Self-pay | Admitting: Physician Assistant

## 2022-09-11 MED ORDER — DOCUSATE SODIUM 100 MG PO CAPS: 100.0000 mg | ORAL_CAPSULE | Freq: Two times a day (BID) | ORAL | 0 refills | Status: DC

## 2022-09-11 MED ORDER — MOUNJARO 2.5 MG/0.5ML ~~LOC~~ SOAJ: 2.5000 mg | SUBCUTANEOUS | 0 refills | Status: DC

## 2022-09-11 MED ORDER — PANTOPRAZOLE SODIUM 40 MG PO TBEC: 40.0000 mg | DELAYED_RELEASE_TABLET | Freq: Two times a day (BID) | ORAL | 0 refills | Status: DC

## 2022-09-11 MED ORDER — ROSUVASTATIN CALCIUM 10 MG PO TABS: 10.0000 mg | ORAL_TABLET | Freq: Every day | ORAL | 0 refills | Status: DC

## 2022-09-13 ENCOUNTER — Other Ambulatory Visit: Payer: Self-pay | Admitting: Physician Assistant

## 2022-09-18 ENCOUNTER — Other Ambulatory Visit: Payer: Self-pay | Admitting: Family Medicine

## 2022-09-18 ENCOUNTER — Encounter: Payer: Self-pay | Admitting: Physician Assistant

## 2022-09-18 ENCOUNTER — Other Ambulatory Visit: Payer: Self-pay | Admitting: Physician Assistant

## 2022-09-18 MED ORDER — MOUNJARO 2.5 MG/0.5ML ~~LOC~~ SOAJ: 2.5000 mg | SUBCUTANEOUS | 0 refills | Status: DC

## 2022-09-18 NOTE — Telephone Encounter (Signed)
See note

## 2022-09-23 NOTE — Telephone Encounter (Signed)
Patient has appointment 10/21/22 @10 :30am

## 2022-09-24 ENCOUNTER — Encounter: Payer: Self-pay | Admitting: Hematology & Oncology

## 2022-09-25 ENCOUNTER — Other Ambulatory Visit: Payer: Self-pay | Admitting: *Deleted

## 2022-09-25 DIAGNOSIS — C7B8 Other secondary neuroendocrine tumors: Secondary | ICD-10-CM

## 2022-09-26 ENCOUNTER — Inpatient Hospital Stay: Payer: BC Managed Care – PPO | Attending: Hematology & Oncology

## 2022-09-26 ENCOUNTER — Inpatient Hospital Stay: Payer: BC Managed Care – PPO

## 2022-09-26 VITALS — BP 125/90 | HR 77 | Temp 98.0°F

## 2022-09-26 DIAGNOSIS — C7B8 Other secondary neuroendocrine tumors: Secondary | ICD-10-CM

## 2022-09-26 DIAGNOSIS — C7A00 Malignant carcinoid tumor of unspecified site: Secondary | ICD-10-CM | POA: Insufficient documentation

## 2022-09-26 DIAGNOSIS — C7B02 Secondary carcinoid tumors of liver: Secondary | ICD-10-CM | POA: Diagnosis not present

## 2022-09-26 LAB — CBC WITH DIFFERENTIAL (CANCER CENTER ONLY)
Abs Immature Granulocytes: 0.04 10*3/uL (ref 0.00–0.07)
Basophils Absolute: 0.1 10*3/uL (ref 0.0–0.1)
Basophils Relative: 1 %
Eosinophils Absolute: 0.2 10*3/uL (ref 0.0–0.5)
Eosinophils Relative: 2 %
HCT: 42.1 % (ref 36.0–46.0)
Hemoglobin: 13.4 g/dL (ref 12.0–15.0)
Immature Granulocytes: 0 %
Lymphocytes Relative: 27 %
Lymphs Abs: 2.7 10*3/uL (ref 0.7–4.0)
MCH: 29.5 pg (ref 26.0–34.0)
MCHC: 31.8 g/dL (ref 30.0–36.0)
MCV: 92.5 fL (ref 80.0–100.0)
Monocytes Absolute: 0.7 10*3/uL (ref 0.1–1.0)
Monocytes Relative: 7 %
Neutro Abs: 6.5 10*3/uL (ref 1.7–7.7)
Neutrophils Relative %: 63 %
Platelet Count: 316 10*3/uL (ref 150–400)
RBC: 4.55 MIL/uL (ref 3.87–5.11)
RDW: 14.6 % (ref 11.5–15.5)
WBC Count: 10.2 10*3/uL (ref 4.0–10.5)
nRBC: 0 % (ref 0.0–0.2)

## 2022-09-26 LAB — CMP (CANCER CENTER ONLY)
ALT: 15 U/L (ref 0–44)
AST: 19 U/L (ref 15–41)
Albumin: 4.3 g/dL (ref 3.5–5.0)
Alkaline Phosphatase: 72 U/L (ref 38–126)
Anion gap: 8 (ref 5–15)
BUN: 15 mg/dL (ref 8–23)
CO2: 29 mmol/L (ref 22–32)
Calcium: 9.7 mg/dL (ref 8.9–10.3)
Chloride: 104 mmol/L (ref 98–111)
Creatinine: 0.76 mg/dL (ref 0.44–1.00)
GFR, Estimated: >60 mL/min (ref >60)
Glucose, Bld: 117 mg/dL — ABNORMAL HIGH (ref 70–99)
Potassium: 5.2 mmol/L — ABNORMAL HIGH (ref 3.5–5.1)
Sodium: 141 mmol/L (ref 135–145)
Total Bilirubin: 0.5 mg/dL (ref 0.3–1.2)
Total Protein: 7.8 g/dL (ref 6.5–8.1)

## 2022-09-26 MED ORDER — LANREOTIDE ACETATE 120 MG/0.5ML ~~LOC~~ SOLN
120.0000 mg | Freq: Once | SUBCUTANEOUS | Status: AC
  Administered 2022-09-26: 120 mg via SUBCUTANEOUS
  Filled 2022-09-26: qty 120

## 2022-10-02 ENCOUNTER — Other Ambulatory Visit: Payer: Self-pay | Admitting: Family Medicine

## 2022-10-03 MED ORDER — DOCUSATE SODIUM 100 MG PO CAPS: 100.0000 mg | ORAL_CAPSULE | Freq: Two times a day (BID) | ORAL | 0 refills | Status: DC

## 2022-10-05 NOTE — Therapy (Signed)
OUTPATIENT PHYSICAL THERAPY LOWER EXTREMITY EVALUATION   Patient Name: Leah [name] MRN: [id number] DOB:1960-12-05, 62 y.o., female Today's Date: 10/06/2022  END OF SESSION:  PT End of Session - 10/06/22 1309     Visit Number 1    Number of Visits 12    Date for PT Re-Evaluation 12/01/22    Authorization Type BCBS    PT Start Time 0820    PT Stop Time 0858    PT Time Calculation (min) 38 min    Activity Tolerance Patient tolerated treatment well    Behavior During Therapy [facility] for tasks assessed/performed             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   Constipation    Diabetes (HCC)    Dyspnea    with exertion   Fatty liver    Gallbladder problem    GE reflux    Hepatic encephalopathy (HCC) 2011   History of PCR DNA positive for HSV1    History of retinal vein occlusion, right eye    HTN (hypertension) 2011   Joint pain    Liver problem    Neuroendocrine tumor 2011   Orthostatic hypotension    Osteoarthritis    Osteopenia    Palpitations    Sarcoidosis 2008   Sleep apnea    uses cpap   Surgical menopause    Past Surgical History:  Procedure Laterality Date   ABDOMINAL HYSTERECTOMY  2004   AUGMENTATION MAMMAPLASTY     BREAST BIOPSY Left    BREAST RECONSTRUCTION Bilateral 05/07/2020   Procedure: Revision of bilateral breast reconstruction;  Surgeon: Allena Napoleon, MD;  Location: [facility];  Service: Plastics;  Laterality: Bilateral;  2 hours   CESAREAN SECTION  [id number]   CHOLECYSTECTOMY  2006   HERNIA REPAIR     KNEE ARTHROSCOPY Left 09/2010   LAPAROSCOPIC HEPATECTOMY     LIPOSUCTION Bilateral 05/07/2020   Procedure: LIPOSUCTION with excision of lateral breast and axillary tissue;  Surgeon: Allena Napoleon, MD;  Location: [facility];  Service:  Plastics;  Laterality: Bilateral;   LIVER SURGERY     MASTECTOMY Bilateral 2008   NASAL SINUS SURGERY     OOPHORECTOMY  2004   RECONSTRUCTION BREAST W/ LATISSIMUS DORSI FLAP Bilateral    TONSILLECTOMY  1976   TOTAL KNEE ARTHROPLASTY Left 10/29/2020   Procedure: TOTAL KNEE ARTHROPLASTY;  Surgeon: Ollen Gross, MD;  Location: [facility];  Service: Orthopedics;  Laterality: Left;   TOTAL KNEE ARTHROPLASTY Right 04/14/2022   Procedure: TOTAL KNEE ARTHROPLASTY;  Surgeon: Ollen Gross, MD;  Location: [facility];  Service: Orthopedics;  Laterality: Right;   TUBAL LIGATION Bilateral 2001   Patient Active Problem List   Diagnosis Date Noted   Primary osteoarthritis of right knee 04/14/2022   Primary osteoarthritis of left knee 10/29/2020   At risk for dehydration 07/04/2020   Depression 07/04/2020   Other fatigue 07/07/2019   Hepatic steatosis 01/03/2019   S/P right knee arthroscopy 10/19/2018   Hyperlipidemia associated with type 2 diabetes mellitus (HCC) 09/01/2018   Class 3 severe obesity with serious comorbidity and body mass index (BMI) of 45.0 to 49.9 in adult (HCC) 09/01/2018   OSA on CPAP 05/04/2018   Solitary pulmonary nodule  05/04/2018   History of prophylactic mastectomy of both breasts 11/04/2017   Preop examination 10/20/2017   Atrophic vaginitis 04/12/2016   Osteopenia 09/27/2015   Branch retinal vein occlusion of left eye 12/12/2014   Hypertensive retinopathy of both eyes 12/12/2014   Lung nodule seen on imaging study 05/01/2014   Shortness of breath on exertion    Type 2 diabetes mellitus with other specified complication (HCC) 03/17/2013   Morbid obesity (HCC) 03/01/2013   Carcinoid syndrome (HCC) 02/21/2013   HSV infection 09/14/2012   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) 10/22/2010   Hypertension associated with type 2 diabetes mellitus (HCC) 10/22/2010   GERD (gastroesophageal reflux disease) 10/22/2010   Allergic rhinitis 10/22/2010   Sarcoidosis  10/22/2010   OA (osteoarthritis) of knee 10/22/2010   Anxiety 10/22/2010    PCP: Bary Leriche, PA-C   REFERRING PROVIDER: Allwardt, Crist Infante, PA-C   REFERRING DIAG: Osteoarthritis of right knee, unspecified osteoarthritis type   THERAPY DIAG:  Chronic pain of right knee  Muscle weakness (generalized)  Other abnormalities of gait and mobility  Rationale for Evaluation and Treatment: Rehabilitation  ONSET DATE: 2022  SUBJECTIVE:   SUBJECTIVE STATEMENT: R TKR 2022, It now sounds like I am stepping on a bag of ruffles.  It is not as strong as it used to be.  Left knee 2024 March. Left knee is much better.   PERTINENT HISTORY: Pt PMH includes but is not limited to: HDL, DM II, OSA on CPAP, B mastectomy, carcinoid syndrome, metastatic neuroendocine ca, and L TKA (2022).  PAIN:  Are you having pain? Yes: NPRS scale: current 2/10; max4/10, least 0/10 Pain location: R knee Pain description: ache Aggravating factors: walking distances no greater than 5 mins Relieving factors: elevating; sitting  PRECAUTIONS: Fall  RED FLAGS: None   WEIGHT BEARING RESTRICTIONS: No  FALLS:  Has patient fallen in last 6 months? No  LIVING ENVIRONMENT: Lives with: lives with their spouse Lives in: House/apartment Stairs: Yes: External: 3 steps; can reach both Has following equipment at home: Single point cane, Walker - 2 wheeled, Tour manager, and Grab bars   PLOF: Independent  PATIENT GOALS: ease the crackle, strengthen, care of 4 yo grandson/care for.  NEXT MD VISIT: oncologist  OBJECTIVE:    PATIENT SURVEYS:  LEFS:28/80  COGNITION: Overall cognitive status: Within functional limits for tasks assessed     SENSATION: WFL  EDEMA:  none  MUSCLE LENGTH: Hamstrings:full   POSTURE: forward head  PALPATION: Left knee crepitus.  LOWER EXTREMITY ROM:  Active ROM Right eval Left eval  Hip flexion wfl wfl  Hip extension    Hip abduction    Hip adduction     Hip internal rotation    Hip external rotation    Knee flexion 125 125  Knee extension 0 0  Ankle dorsiflexion    Ankle plantarflexion    Ankle inversion    Ankle eversion     (Blank rows = not tested)  LOWER EXTREMITY MMT:  MMT Right eval Left eval  Hip flexion 49.1 50.9  Hip extension    Hip abduction 34.5 33.4  Hip adduction    Hip internal rotation    Hip external rotation    Knee flexion    Knee extension 30.7 34.2  Ankle dorsiflexion    Ankle plantarflexion    Ankle inversion    Ankle eversion     (Blank rows = not tested)   FUNCTIONAL TESTS:  5 times sit to stand:  13.80 Timed up and go (TUG): 11.31 4 stage:SLS x 5s 3 min walk test:419ft around pool.  GAIT: Distance walked: 400 Assistive device utilized: None Level of assistance: SBA Comments: Slowed cadence, shorted step length, decreased hip flex with swing   TODAY'S TREATMENT:                                                                                                                              Evaluation Functional testing LEFS   PATIENT EDUCATION:  Education details: Discussed eval findings, rehab rationale, aquatic program progression/POC and pools in area. Patient is in agreement  Person educated: Patient and Spouse Education method: Explanation Education comprehension: verbalized understanding  HOME EXERCISE PROGRAM: Pt following land based HEP given to her from last session of therapy ([facility])  ASSESSMENT:  CLINICAL IMPRESSION: Patient is a 62 y.o. f who was seen today for physical therapy evaluation and treatment for right knee pain s/p TKR 2022. Has had a Left TKR March 2024.  Pt complains of left knee "cracking and popping" with some minimal pain associated. Crepitus palpated with left knee extension supports complaints. Her ROM bilat knees are good.  Strength in LE are symmetrical although weakness is noted throughout. She reports she is raising her 4yo grandson and needs to  be able to get on floor with him as well as left objects she would not be lifting otherwise. She is battling metastatic liver cancer which may decrease her ability to tolerate therapy and slow her progression.  She states she does get winded quickly with walking up to 5 mins. She will benefit from skilled physical therapy intervention.  Will begin in aquatic setting to use the properties of water to progress strength and improve balance in low loaded and safe setting then will progress to land based for added strengthening, balance and gait for improvement in functional mobility and endurance.  OBJECTIVE IMPAIRMENTS: Abnormal gait, decreased balance, decreased endurance, difficulty walking, decreased strength, and pain.   ACTIVITY LIMITATIONS: lifting, bending, squatting, stairs, and locomotion level  PARTICIPATION LIMITATIONS: cleaning, shopping, community activity, and yard work  PERSONAL FACTORS: Fitness, Past/current experiences, and 1 comorbidity: Liver ca  are also affecting patient's functional outcome.   REHAB POTENTIAL: Good  CLINICAL DECISION MAKING: Evolving/moderate complexity  EVALUATION COMPLEXITY: Moderate   GOALS: Goals reviewed with patient? Yes  SHORT TERM GOALS: Target date: Sept 27, 2024 Pt will tolerate full aquatic sessions consistently without increase in pain and with improving function to demonstrate good toleration and effectiveness of intervention.  Baseline: Goal status: INITIAL  2.  Pt will improve on 5 X STS test to <or= 10  to demonstrate improving functional lower extremity strength, transitional movements, and balance Baseline: 13.80 Goal status: INITIAL   3.  Pt will be able to complete SLS R/L x 20s on bottom step in pool to demonstrate improving balance Baseline: 5 s land Goal status: INITIAL  4. Pt will be instructed and indep  with rising from floor.  Baseline: can do it but its hard  Goal: INITIAL    LONG TERM GOALS: Target date: Dec 01, 2022  Pt will improve on LEFS by at least 9 points to demonstrate statistically significant change in pt functional ability Baseline: 28/80 Goal status: INITIAL  2.  Walk through grocery store pushing cart to demonstrate improved toleration to amb Baseline: unable Goal status: INITIAL  3.  3 MWT to improve to 678ft to demonstrate improved toleration to amb. Baseline: 400 Goal status: INITIAL  4.  PT will improve SLS up to or >15s Baseline: 5 Goal status: INITIAL  5.  5.  Pt will be indep with final HEP's (land and aquatic as appropriate) for continued management of condition Baseline:  Goal status: INITIAL  6.  Pt will improve strength in all areas listed by at least 10lbs to demonstrate improved overall physical function Baseline: see chart Goal status: INITIAL   PLAN:  PT FREQUENCY: 1-2x/week  PT DURATION: 8 weeks 12 visit.  Extended weeks to allow for scheduling conflicts  PLANNED INTERVENTIONS: Therapeutic exercises, Therapeutic activity, Neuromuscular re-education, Balance training, Gait training, Patient/Family education, Self Care, Joint mobilization, Joint manipulation, Stair training, Orthotic/Fit training, DME instructions, Aquatic Therapy, Dry Needling, Electrical stimulation, Cryotherapy, Moist heat, scar mobilization, Taping, Ultrasound, Ionotophoresis 4mg /ml Dexamethasone, Manual therapy, and Re-evaluation  PLAN FOR NEXT SESSION: Aquatics: le and core strengthening; balance retraining; gait and endurance retraining Land: instruct on use of approp equipment (pt to join gym), le strengthening, floor transfers, body mechanics.   Geni Bers, PT 10/06/2022, 1:11 PM

## 2022-10-06 ENCOUNTER — Ambulatory Visit (HOSPITAL_BASED_OUTPATIENT_CLINIC_OR_DEPARTMENT_OTHER): Payer: BC Managed Care – PPO | Admitting: Physical Therapy

## 2022-10-06 ENCOUNTER — Encounter (HOSPITAL_BASED_OUTPATIENT_CLINIC_OR_DEPARTMENT_OTHER): Payer: Self-pay | Admitting: Physical Therapy

## 2022-10-06 ENCOUNTER — Other Ambulatory Visit: Payer: Self-pay

## 2022-10-06 DIAGNOSIS — G8929 Other chronic pain: Secondary | ICD-10-CM | POA: Insufficient documentation

## 2022-10-06 DIAGNOSIS — M6281 Muscle weakness (generalized): Secondary | ICD-10-CM | POA: Diagnosis not present

## 2022-10-06 DIAGNOSIS — M25561 Pain in right knee: Secondary | ICD-10-CM | POA: Diagnosis not present

## 2022-10-06 DIAGNOSIS — R2689 Other abnormalities of gait and mobility: Secondary | ICD-10-CM | POA: Diagnosis not present

## 2022-10-09 DIAGNOSIS — E1169 Type 2 diabetes mellitus with other specified complication: Secondary | ICD-10-CM | POA: Diagnosis not present

## 2022-10-09 DIAGNOSIS — F418 Other specified anxiety disorders: Secondary | ICD-10-CM | POA: Diagnosis not present

## 2022-10-09 DIAGNOSIS — E782 Mixed hyperlipidemia: Secondary | ICD-10-CM | POA: Diagnosis not present

## 2022-10-09 DIAGNOSIS — G4733 Obstructive sleep apnea (adult) (pediatric): Secondary | ICD-10-CM | POA: Diagnosis not present

## 2022-10-10 ENCOUNTER — Encounter (HOSPITAL_BASED_OUTPATIENT_CLINIC_OR_DEPARTMENT_OTHER): Payer: Self-pay | Admitting: Physical Therapy

## 2022-10-10 ENCOUNTER — Ambulatory Visit (HOSPITAL_BASED_OUTPATIENT_CLINIC_OR_DEPARTMENT_OTHER): Payer: BC Managed Care – PPO | Admitting: Physical Therapy

## 2022-10-10 DIAGNOSIS — M25561 Pain in right knee: Secondary | ICD-10-CM | POA: Diagnosis not present

## 2022-10-10 DIAGNOSIS — M6281 Muscle weakness (generalized): Secondary | ICD-10-CM | POA: Diagnosis not present

## 2022-10-10 DIAGNOSIS — R2689 Other abnormalities of gait and mobility: Secondary | ICD-10-CM

## 2022-10-10 DIAGNOSIS — G8929 Other chronic pain: Secondary | ICD-10-CM | POA: Diagnosis not present

## 2022-10-10 NOTE — Therapy (Signed)
OUTPATIENT PHYSICAL THERAPY LOWER EXTREMITY EVALUATION   Patient Name: Leah [name] MRN: [id number] DOB:12-27-1960, 62 y.o., female Today's Date: 10/10/2022  END OF SESSION:  PT End of Session - 10/10/22 1131     Visit Number 2    Number of Visits 12    Date for PT Re-Evaluation 12/01/22    Authorization Type BC[employer]    PT Start Time 1125   late   PT Stop Time 1200    PT Time Calculation (min) 35 min    Activity Tolerance Patient tolerated treatment well    Behavior During Therapy [facility] for tasks assessed/performed             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   Constipation    Diabetes (HCC)    Dyspnea    with exertion   Fatty liver    Gallbladder problem    GE reflux    Hepatic encephalopathy (HCC) 2011   History of PCR DNA positive for HSV1    History of retinal vein occlusion, right eye    HTN (hypertension) 2011   Joint pain    Liver problem    Neuroendocrine tumor 2011   Orthostatic hypotension    Osteoarthritis    Osteopenia    Palpitations    Sarcoidosis 2008   Sleep apnea    uses cpap   Surgical menopause    Past Surgical History:  Procedure Laterality Date   ABDOMINAL HYSTERECTOMY  2004   AUGMENTATION MAMMAPLASTY     BREAST BIOPSY Left    BREAST RECONSTRUCTION Bilateral 05/07/2020   Procedure: Revision of bilateral breast reconstruction;  Surgeon: Allena Napoleon, MD;  Location: [facility];  Service: Plastics;  Laterality: Bilateral;  2 hours   CESAREAN SECTION  [id number]   CHOLECYSTECTOMY  2006   HERNIA REPAIR     KNEE ARTHROSCOPY Left 09/2010   LAPAROSCOPIC HEPATECTOMY     LIPOSUCTION Bilateral 05/07/2020   Procedure: LIPOSUCTION with excision of lateral breast and axillary tissue;  Surgeon: Allena Napoleon, MD;  Location: [facility];   Service: Plastics;  Laterality: Bilateral;   LIVER SURGERY     MASTECTOMY Bilateral 2008   NASAL SINUS SURGERY     OOPHORECTOMY  2004   RECONSTRUCTION BREAST W/ LATISSIMUS DORSI FLAP Bilateral    TONSILLECTOMY  1976   TOTAL KNEE ARTHROPLASTY Left 10/29/2020   Procedure: TOTAL KNEE ARTHROPLASTY;  Surgeon: Ollen Gross, MD;  Location: [facility];  Service: Orthopedics;  Laterality: Left;   TOTAL KNEE ARTHROPLASTY Right 04/14/2022   Procedure: TOTAL KNEE ARTHROPLASTY;  Surgeon: Ollen Gross, MD;  Location: [facility];  Service: Orthopedics;  Laterality: Right;   TUBAL LIGATION Bilateral 2001   Patient Active Problem List   Diagnosis Date Noted   Primary osteoarthritis of right knee 04/14/2022   Primary osteoarthritis of left knee 10/29/2020   At risk for dehydration 07/04/2020   Depression 07/04/2020   Other fatigue 07/07/2019   Hepatic steatosis 01/03/2019   S/P right knee arthroscopy 10/19/2018   Hyperlipidemia associated with type 2 diabetes mellitus (HCC) 09/01/2018   Class 3 severe obesity with serious comorbidity and body mass index (BMI) of 45.0 to 49.9 in adult (HCC) 09/01/2018   OSA on CPAP 05/04/2018   Solitary  pulmonary nodule 05/04/2018   History of prophylactic mastectomy of both breasts 11/04/2017   Preop examination 10/20/2017   Atrophic vaginitis 04/12/2016   Osteopenia 09/27/2015   Branch retinal vein occlusion of left eye 12/12/2014   Hypertensive retinopathy of both eyes 12/12/2014   Lung nodule seen on imaging study 05/01/2014   Shortness of breath on exertion    Type 2 diabetes mellitus with other specified complication (HCC) 03/17/2013   Morbid obesity (HCC) 03/01/2013   Carcinoid syndrome (HCC) 02/21/2013   HSV infection 09/14/2012   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) 10/22/2010   Hypertension associated with type 2 diabetes mellitus (HCC) 10/22/2010   GERD (gastroesophageal reflux disease) 10/22/2010   Allergic rhinitis 10/22/2010    Sarcoidosis 10/22/2010   OA (osteoarthritis) of knee 10/22/2010   Anxiety 10/22/2010    PCP: Bary Leriche, PA-C   REFERRING PROVIDER: Allwardt, Crist Infante, PA-C   REFERRING DIAG: Osteoarthritis of right knee, unspecified osteoarthritis type   THERAPY DIAG:  Chronic pain of right knee  Muscle weakness (generalized)  Other abnormalities of gait and mobility  Rationale for Evaluation and Treatment: Rehabilitation  ONSET DATE: 2022  SUBJECTIVE:   SUBJECTIVE STATEMENT: "A little nervous but I am ready"   Initial subjective R TKR 2022, It now sounds like I am stepping on a bag of ruffles.  It is not as strong as it used to be.  Left knee 2024 March. Left knee is much better.   PERTINENT HISTORY: Pt PMH includes but is not limited to: HDL, DM II, OSA on CPAP, B mastectomy, carcinoid syndrome, metastatic neuroendocine ca, and L TKA (2022).  PAIN:  Are you having pain? Yes: NPRS scale: current 2/10; max4/10, least 0/10 Pain location: R knee Pain description: ache Aggravating factors: walking distances no greater than 5 mins Relieving factors: elevating; sitting  PRECAUTIONS: Fall  RED FLAGS: None   WEIGHT BEARING RESTRICTIONS: No  FALLS:  Has patient fallen in last 6 months? No  LIVING ENVIRONMENT: Lives with: lives with their spouse Lives in: House/apartment Stairs: Yes: External: 3 steps; can reach both Has following equipment at home: Single point cane, Walker - 2 wheeled, Tour manager, and Grab bars   PLOF: Independent  PATIENT GOALS: ease the crackle, strengthen, care of 4 yo grandson/care for.  NEXT MD VISIT: oncologist  OBJECTIVE:    PATIENT SURVEYS:  LEFS:28/80  COGNITION: Overall cognitive status: Within functional limits for tasks assessed     SENSATION: WFL  EDEMA:  none  MUSCLE LENGTH: Hamstrings:full   POSTURE: forward head  PALPATION: Left knee crepitus.  LOWER EXTREMITY ROM:  Active ROM Right eval Left eval  Hip  flexion wfl wfl  Hip extension    Hip abduction    Hip adduction    Hip internal rotation    Hip external rotation    Knee flexion 125 125  Knee extension 0 0  Ankle dorsiflexion    Ankle plantarflexion    Ankle inversion    Ankle eversion     (Blank rows = not tested)  LOWER EXTREMITY MMT:  MMT Right eval Left eval  Hip flexion 49.1 50.9  Hip extension    Hip abduction 34.5 33.4  Hip adduction    Hip internal rotation    Hip external rotation    Knee flexion    Knee extension 30.7 34.2  Ankle dorsiflexion    Ankle plantarflexion    Ankle inversion    Ankle eversion     (Blank rows =  not tested)   FUNCTIONAL TESTS:  5 times sit to stand: 13.80 Timed up and go (TUG): 11.31 4 stage:SLS x 5s 3 min walk test:466ft around pool.  GAIT: Distance walked: 400 Assistive device utilized: None Level of assistance: SBA Comments: Slowed cadence, shorted step length, decreased hip flex with swing   TODAY'S TREATMENT:                                                                                                                              Pt seen for aquatic therapy today.  Treatment took place in water 3.5-4.75 ft in depth at the [facility] pool. Temp of water was 91.  Pt entered/exited the pool via stairs using step to pattern with hand rail.  *Intro to setting *walking ue support barbell 3.6 ft forward, back, side stepping *Side lunge ue add/abd without support x 4 widths *STS from bench onto water step unsupported x 10 *Seated on lift: LAQ 2 x 20; hip add/abd 2 x 20; cycling 2 x 20; flutter kicking 2 x 20 *walking  between exercises for rest *Standing ue support yellow HB: Squats; hip add/abd; hip ext; x10.  Hip flex/ext difficult *Hip hiking bottom step.  Pt requires the buoyancy and hydrostatic pressure of water for support, and to offload joints by unweighting joint load by at least 50 % in navel deep water and by at least 75-80% in chest to neck deep  water.  Viscosity of the water is needed for resistance of strengthening. Water current perturbations provides challenge to standing balance requiring increased core activation.      PATIENT EDUCATION:  Education details: Discussed eval findings, rehab rationale, aquatic program progression/POC and pools in area. Patient is in agreement  Person educated: Patient and Spouse Education method: Explanation Education comprehension: verbalized understanding  HOME EXERCISE PROGRAM: Pt following land based HEP given to her from last session of therapy ([facility])  ASSESSMENT:  CLINICAL IMPRESSION: Pt demonstrates safety and indep in setting with therapist instructing from deck.  She is directed through gentle movement patterns, exercise and balance challenges.  Visual note: pt with glut weakness impeding balance ability. She tolerates session without increase in pain.  Requires VC and demonstration for execution.  Some difficulty with maintaining balance with le exercises.  Goals ongoing.  Initial Impression Patient is a 62 y.o. f who was seen today for physical therapy evaluation and treatment for right knee pain s/p TKR 2022. Has had a Left TKR March 2024.  Pt complains of left knee "cracking and popping" with some minimal pain associated. Crepitus palpated with left knee extension supports complaints. Her ROM bilat knees are good.  Strength in LE are symmetrical although weakness is noted throughout. She reports she is raising her 4yo grandson and needs to be able to get on floor with him as well as left objects she would not be lifting otherwise. She is battling metastatic liver cancer which may decrease her ability  to tolerate therapy and slow her progression.  She states she does get winded quickly with walking up to 5 mins. She will benefit from skilled physical therapy intervention.  Will begin in aquatic setting to use the properties of water to progress strength and improve balance in low  loaded and safe setting then will progress to land based for added strengthening, balance and gait for improvement in functional mobility and endurance.  OBJECTIVE IMPAIRMENTS: Abnormal gait, decreased balance, decreased endurance, difficulty walking, decreased strength, and pain.   ACTIVITY LIMITATIONS: lifting, bending, squatting, stairs, and locomotion level  PARTICIPATION LIMITATIONS: cleaning, shopping, community activity, and yard work  PERSONAL FACTORS: Fitness, Past/current experiences, and 1 comorbidity: Liver ca  are also affecting patient's functional outcome.   REHAB POTENTIAL: Good  CLINICAL DECISION MAKING: Evolving/moderate complexity  EVALUATION COMPLEXITY: Moderate   GOALS: Goals reviewed with patient? Yes  SHORT TERM GOALS: Target date: Sept 27, 2024 Pt will tolerate full aquatic sessions consistently without increase in pain and with improving function to demonstrate good toleration and effectiveness of intervention.  Baseline: Goal status: INITIAL  2.  Pt will improve on 5 X STS test to <or= 10  to demonstrate improving functional lower extremity strength, transitional movements, and balance Baseline: 13.80 Goal status: INITIAL   3.  Pt will be able to complete SLS R/L x 20s on bottom step in pool to demonstrate improving balance Baseline: 5 s land Goal status: INITIAL  4. Pt will be instructed and indep with rising from floor.  Baseline: can do it but its hard  Goal: INITIAL    LONG TERM GOALS: Target date: Dec 01, 2022  Pt will improve on LEFS by at least 9 points to demonstrate statistically significant change in pt functional ability Baseline: 28/80 Goal status: INITIAL  2.  Walk through grocery store pushing cart to demonstrate improved toleration to amb Baseline: unable Goal status: INITIAL  3.  3 MWT to improve to 660ft to demonstrate improved toleration to amb. Baseline: 400 Goal status: INITIAL  4.  PT will improve SLS up to or  >15s Baseline: 5 Goal status: INITIAL  5.  5.  Pt will be indep with final HEP's (land and aquatic as appropriate) for continued management of condition Baseline:  Goal status: INITIAL  6.  Pt will improve strength in all areas listed by at least 10lbs to demonstrate improved overall physical function Baseline: see chart Goal status: INITIAL   PLAN:  PT FREQUENCY: 1-2x/week  PT DURATION: 8 weeks 12 visit.  Extended weeks to allow for scheduling conflicts  PLANNED INTERVENTIONS: Therapeutic exercises, Therapeutic activity, Neuromuscular re-education, Balance training, Gait training, Patient/Family education, Self Care, Joint mobilization, Joint manipulation, Stair training, Orthotic/Fit training, DME instructions, Aquatic Therapy, Dry Needling, Electrical stimulation, Cryotherapy, Moist heat, scar mobilization, Taping, Ultrasound, Ionotophoresis 4mg /ml Dexamethasone, Manual therapy, and Re-evaluation  PLAN FOR NEXT SESSION: Aquatics: le and core strengthening; balance retraining; gait and endurance retraining Land: instruct on use of approp equipment (pt to join gym), le strengthening, floor transfers, body mechanics.   Geni Bers, PT 10/10/2022, 11:33 AM

## 2022-10-14 ENCOUNTER — Ambulatory Visit (HOSPITAL_BASED_OUTPATIENT_CLINIC_OR_DEPARTMENT_OTHER): Payer: BC Managed Care – PPO | Attending: Physician Assistant | Admitting: Physical Therapy

## 2022-10-14 ENCOUNTER — Encounter (HOSPITAL_BASED_OUTPATIENT_CLINIC_OR_DEPARTMENT_OTHER): Payer: Self-pay | Admitting: Physical Therapy

## 2022-10-14 DIAGNOSIS — M6281 Muscle weakness (generalized): Secondary | ICD-10-CM | POA: Diagnosis not present

## 2022-10-14 DIAGNOSIS — G8929 Other chronic pain: Secondary | ICD-10-CM | POA: Diagnosis not present

## 2022-10-14 DIAGNOSIS — R2689 Other abnormalities of gait and mobility: Secondary | ICD-10-CM | POA: Insufficient documentation

## 2022-10-14 DIAGNOSIS — M25561 Pain in right knee: Secondary | ICD-10-CM | POA: Insufficient documentation

## 2022-10-14 NOTE — Therapy (Signed)
OUTPATIENT PHYSICAL THERAPY LOWER EXTREMITY TREATMENT   Patient Name: Leah [name] MRN: [id number] DOB:1960/04/06, 62 y.o., female Today's Date: 10/14/2022  END OF SESSION:  PT End of Session - 10/14/22 0746     Visit Number 3    Number of Visits 12    Date for PT Re-Evaluation 12/01/22    Authorization Type BC[employer]    PT Start Time [id number]   pt arrived late   PT Stop Time 0815    PT Time Calculation (min) 33 min    Behavior During Therapy [facility] for tasks assessed/performed             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   Constipation    Diabetes (HCC)    Dyspnea    with exertion   Fatty liver    Gallbladder problem    GE reflux    Hepatic encephalopathy (HCC) 2011   History of PCR DNA positive for HSV1    History of retinal vein occlusion, right eye    HTN (hypertension) 2011   Joint pain    Liver problem    Neuroendocrine tumor 2011   Orthostatic hypotension    Osteoarthritis    Osteopenia    Palpitations    Sarcoidosis 2008   Sleep apnea    uses cpap   Surgical menopause    Past Surgical History:  Procedure Laterality Date   ABDOMINAL HYSTERECTOMY  2004   AUGMENTATION MAMMAPLASTY     BREAST BIOPSY Left    BREAST RECONSTRUCTION Bilateral 05/07/2020   Procedure: Revision of bilateral breast reconstruction;  Surgeon: Allena Napoleon, MD;  Location: [facility];  Service: Plastics;  Laterality: Bilateral;  2 hours   CESAREAN SECTION  1191,4782   CHOLECYSTECTOMY  2006   HERNIA REPAIR     KNEE ARTHROSCOPY Left 09/2010   LAPAROSCOPIC HEPATECTOMY     LIPOSUCTION Bilateral 05/07/2020   Procedure: LIPOSUCTION with excision of lateral breast and axillary tissue;  Surgeon: Allena Napoleon, MD;  Location: [facility];  Service: Plastics;  Laterality: Bilateral;    LIVER SURGERY     MASTECTOMY Bilateral 2008   NASAL SINUS SURGERY     OOPHORECTOMY  2004   RECONSTRUCTION BREAST W/ LATISSIMUS DORSI FLAP Bilateral    TONSILLECTOMY  1976   TOTAL KNEE ARTHROPLASTY Left 10/29/2020   Procedure: TOTAL KNEE ARTHROPLASTY;  Surgeon: Ollen Gross, MD;  Location: [facility];  Service: Orthopedics;  Laterality: Left;   TOTAL KNEE ARTHROPLASTY Right 04/14/2022   Procedure: TOTAL KNEE ARTHROPLASTY;  Surgeon: Ollen Gross, MD;  Location: [facility];  Service: Orthopedics;  Laterality: Right;   TUBAL LIGATION Bilateral 2001   Patient Active Problem List   Diagnosis Date Noted   Primary osteoarthritis of right knee 04/14/2022   Primary osteoarthritis of left knee 10/29/2020   At risk for dehydration 07/04/2020   Depression 07/04/2020   Other fatigue 07/07/2019   Hepatic steatosis 01/03/2019   S/P right knee arthroscopy 10/19/2018   Hyperlipidemia associated with type 2 diabetes mellitus (HCC) 09/01/2018   Class 3 severe obesity with serious comorbidity and body mass index (BMI) of 45.0 to 49.9 in adult (HCC) 09/01/2018   OSA on CPAP 05/04/2018   Solitary pulmonary nodule 05/04/2018   History of  prophylactic mastectomy of both breasts 11/04/2017   Preop examination 10/20/2017   Atrophic vaginitis 04/12/2016   Osteopenia 09/27/2015   Branch retinal vein occlusion of left eye 12/12/2014   Hypertensive retinopathy of both eyes 12/12/2014   Lung nodule seen on imaging study 05/01/2014   Shortness of breath on exertion    Type 2 diabetes mellitus with other specified complication (HCC) 03/17/2013   Morbid obesity (HCC) 03/01/2013   Carcinoid syndrome (HCC) 02/21/2013   HSV infection 09/14/2012   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) 10/22/2010   Hypertension associated with type 2 diabetes mellitus (HCC) 10/22/2010   GERD (gastroesophageal reflux disease) 10/22/2010   Allergic rhinitis 10/22/2010   Sarcoidosis 10/22/2010   OA (osteoarthritis) of  knee 10/22/2010   Anxiety 10/22/2010    PCP: Bary Leriche, PA-C   REFERRING PROVIDER: Allwardt, Crist Infante, PA-C   REFERRING DIAG: Osteoarthritis of right knee, unspecified osteoarthritis type   THERAPY DIAG:  Chronic pain of right knee  Muscle weakness (generalized)  Other abnormalities of gait and mobility  Rationale for Evaluation and Treatment: Rehabilitation  ONSET DATE: 2022  SUBJECTIVE:   SUBJECTIVE STATEMENT: "I felt enough to know I did something" after last aquatic session.    Initial subjective R TKR 2022, It now sounds like I am stepping on a bag of ruffles.  It is not as strong as it used to be.  Left knee 2024 March. Left knee is much better.   PERTINENT HISTORY: Pt PMH includes but is not limited to: HDL, DM II, OSA on CPAP, B mastectomy, carcinoid syndrome, metastatic neuroendocine ca, and L TKA (2022).  PAIN:  Are you having pain? Yes: NPRS scale: current 2/10 Pain location: R knee Pain description: stiffness, ache in R hip  Aggravating factors: walking distances no greater than 5 mins Relieving factors: elevating; sitting  PRECAUTIONS: Fall  RED FLAGS: None   WEIGHT BEARING RESTRICTIONS: No  FALLS:  Has patient fallen in last 6 months? No  LIVING ENVIRONMENT: Lives with: lives with their spouse Lives in: House/apartment Stairs: Yes: External: 3 steps; can reach both Has following equipment at home: Single point cane, Walker - 2 wheeled, Tour manager, and Grab bars   PLOF: Independent  PATIENT GOALS: ease the crackle, strengthen, care of 4 yo grandson/care for.  NEXT MD VISIT: oncologist  OBJECTIVE:    PATIENT SURVEYS:  LEFS:28/80  COGNITION: Overall cognitive status: Within functional limits for tasks assessed     SENSATION: WFL  EDEMA:  none  MUSCLE LENGTH: Hamstrings:full   POSTURE: forward head  PALPATION: Left knee crepitus.  LOWER EXTREMITY ROM:  Active ROM Right eval Left eval  Hip flexion wfl wfl   Hip extension    Hip abduction    Hip adduction    Hip internal rotation    Hip external rotation    Knee flexion 125 125  Knee extension 0 0  Ankle dorsiflexion    Ankle plantarflexion    Ankle inversion    Ankle eversion     (Blank rows = not tested)  LOWER EXTREMITY MMT:  MMT Right eval Left eval  Hip flexion 49.1 50.9  Hip extension    Hip abduction 34.5 33.4  Hip adduction    Hip internal rotation    Hip external rotation    Knee flexion    Knee extension 30.7 34.2  Ankle dorsiflexion    Ankle plantarflexion    Ankle inversion    Ankle eversion     (Blank  rows = not tested)   FUNCTIONAL TESTS:  5 times sit to stand: 13.80 Timed up and go (TUG): 11.31 4 stage:SLS x 5s 3 min walk test:473ft around pool.  GAIT: Distance walked: 400 Assistive device utilized: None Level of assistance: SBA Comments: Slowed cadence, shorted step length, decreased hip flex with swing   TODAY'S TREATMENT:                                                                                                                              Pt seen for aquatic therapy today.  Treatment took place in water 3.5-4.75 ft in depth at the [facility] pool. Temp of water was 91.  Pt entered/exited the pool via stairs using step to pattern with hand rail.   *Without support: walking forward, back, side stepping -> side stepping with arm addct with rainbow hand floats x 1 lap * farmer carry with rainbow hand floats at side, marching forward/ backward  * UE on yellow hand floats:  heel raises x 10; 3 way straight LE kicks 2 sets of 5;  forward walking kicks;  * holding wall: hip openers/ crosses; single leg clams  * straddling yellow noodle with no UE support: gentle cycling   Pt requires the buoyancy and hydrostatic pressure of water for support, and to offload joints by unweighting joint load by at least 50 % in navel deep water and by at least 75-80% in chest to neck deep water.   Viscosity of the water is needed for resistance of strengthening. Water current perturbations provides challenge to standing balance requiring increased core activation.  PATIENT EDUCATION:  Education details: aquatic therapy exercise rationale, progressions/ modifications  Person educated: Patient  Education method: Explanation Education comprehension: verbalized understanding  HOME EXERCISE PROGRAM: Pt following land based HEP given to her from last session of therapy ([facility])  ASSESSMENT:  CLINICAL IMPRESSION: Pt demonstrated decreased balance in Lt SLS with LE exercises.  Session shortened due to pt's late arrival.  Good progression of exercise without any increase in pain.  Progressing towards goals.     Initial Impression Patient is a 62 y.o. f who was seen today for physical therapy evaluation and treatment for right knee pain s/p TKR 2022. Has had a Left TKR March 2024.  Pt complains of left knee "cracking and popping" with some minimal pain associated. Crepitus palpated with left knee extension supports complaints. Her ROM bilat knees are good.  Strength in LE are symmetrical although weakness is noted throughout. She reports she is raising her 4yo grandson and needs to be able to get on floor with him as well as left objects she would not be lifting otherwise. She is battling metastatic liver cancer which may decrease her ability to tolerate therapy and slow her progression.  She states she does get winded quickly with walking up to 5 mins. She will benefit from skilled physical therapy intervention.  Will begin in aquatic setting  to use the properties of water to progress strength and improve balance in low loaded and safe setting then will progress to land based for added strengthening, balance and gait for improvement in functional mobility and endurance.  OBJECTIVE IMPAIRMENTS: Abnormal gait, decreased balance, decreased endurance, difficulty walking, decreased strength, and  pain.   ACTIVITY LIMITATIONS: lifting, bending, squatting, stairs, and locomotion level  PARTICIPATION LIMITATIONS: cleaning, shopping, community activity, and yard work  PERSONAL FACTORS: Fitness, Past/current experiences, and 1 comorbidity: Liver ca  are also affecting patient's functional outcome.   REHAB POTENTIAL: Good  CLINICAL DECISION MAKING: Evolving/moderate complexity  EVALUATION COMPLEXITY: Moderate   GOALS: Goals reviewed with patient? Yes  SHORT TERM GOALS: Target date: Sept 27, 2024 Pt will tolerate full aquatic sessions consistently without increase in pain and with improving function to demonstrate good toleration and effectiveness of intervention.  Baseline: Goal status: INITIAL  2.  Pt will improve on 5 X STS test to <or= 10  to demonstrate improving functional lower extremity strength, transitional movements, and balance Baseline: 13.80 Goal status: INITIAL   3.  Pt will be able to complete SLS R/L x 20s on bottom step in pool to demonstrate improving balance Baseline: 5 s land Goal status: INITIAL  4. Pt will be instructed and indep with rising from floor.  Baseline: can do it but its hard  Goal: INITIAL    LONG TERM GOALS: Target date: Dec 01, 2022  Pt will improve on LEFS by at least 9 points to demonstrate statistically significant change in pt functional ability Baseline: 28/80 Goal status: INITIAL  2.  Walk through grocery store pushing cart to demonstrate improved toleration to amb Baseline: unable Goal status: INITIAL  3.  3 MWT to improve to 662ft to demonstrate improved toleration to amb. Baseline: 400 Goal status: INITIAL  4.  PT will improve SLS up to or >15s Baseline: 5 Goal status: INITIAL  5.  5.  Pt will be indep with final HEP's (land and aquatic as appropriate) for continued management of condition Baseline:  Goal status: INITIAL  6.  Pt will improve strength in all areas listed by at least 10lbs to demonstrate improved  overall physical function Baseline: see chart Goal status: INITIAL   PLAN:  PT FREQUENCY: 1-2x/week  PT DURATION: 8 weeks 12 visit.  Extended weeks to allow for scheduling conflicts  PLANNED INTERVENTIONS: Therapeutic exercises, Therapeutic activity, Neuromuscular re-education, Balance training, Gait training, Patient/Family education, Self Care, Joint mobilization, Joint manipulation, Stair training, Orthotic/Fit training, DME instructions, Aquatic Therapy, Dry Needling, Electrical stimulation, Cryotherapy, Moist heat, scar mobilization, Taping, Ultrasound, Ionotophoresis 4mg /ml Dexamethasone, Manual therapy, and Re-evaluation  PLAN FOR NEXT SESSION: Aquatics: le and core strengthening; balance retraining; gait and endurance retraining Land: instruct on use of approp equipment (pt to join gym), le strengthening, floor transfers, body mechanics.  Mayer Camel, PTA 10/14/22 1:39 PM [facility] [address] [place], [place], [address] Phone: [phone]   Fax:  [phone]

## 2022-10-15 ENCOUNTER — Other Ambulatory Visit: Payer: Self-pay | Admitting: Physician Assistant

## 2022-10-21 ENCOUNTER — Telehealth (INDEPENDENT_AMBULATORY_CARE_PROVIDER_SITE_OTHER): Payer: BC Managed Care – PPO | Admitting: Physician Assistant

## 2022-10-21 VITALS — Ht 59.0 in | Wt 173.0 lb

## 2022-10-21 DIAGNOSIS — M25562 Pain in left knee: Secondary | ICD-10-CM | POA: Diagnosis not present

## 2022-10-21 DIAGNOSIS — M25561 Pain in right knee: Secondary | ICD-10-CM | POA: Diagnosis not present

## 2022-10-21 DIAGNOSIS — G8929 Other chronic pain: Secondary | ICD-10-CM | POA: Diagnosis not present

## 2022-10-21 MED ORDER — GABAPENTIN 100 MG PO CAPS
100.0000 mg | ORAL_CAPSULE | Freq: Every day | ORAL | 3 refills | Status: DC
Start: 2022-10-21 — End: 2023-08-25

## 2022-10-21 NOTE — Progress Notes (Signed)
   Virtual Visit via Video Note  I connected with  Leah [name]  on 10/21/22 at 11:00 AM EDT by a video enabled telemedicine application and verified that I am speaking with the correct person using two identifiers.  Location: Patient: home Provider: Nature conservation officer at [employer] Persons present: Patient and myself   I discussed the limitations of evaluation and management by telemedicine and the availability of in person appointments. The patient expressed understanding and agreed to proceed.   History of Present Illness:  62 yo female presenting for VV to discuss gabapentin.  Previously had been taking gabapentin for chronic knee pain.  She hasn't been taking this since her knee surgery earlier this year.  Only having pain if going up a few stairs (three at home) - has to pull herself up with handrail. Doing water PT aerobics to help with strength.  Still having some knee pain at night and would like to start back on gabapentin to help with rest at night.    Observations/Objective:   Gen: Awake, alert, no acute distress Resp: Breathing is even and non-labored Psych: calm/pleasant demeanor Neuro: Alert and Oriented x 3, + facial symmetry, speech is clear.   Assessment and Plan:  Problem List Items Addressed This Visit       Other   Chronic pain of both knees - Primary    TKA Left in 2022 TKA Right in 04/2022  Still has some pain in the evening while trying to sleep and rest Previously did well with Gabapentin low dose - refilled the 100 mg at bedtime  Pt aware of risks vs benefits and possible adverse reactions She will cont PT as she is doing      Relevant Medications   gabapentin (NEURONTIN) 100 MG capsule     Follow Up Instructions:    I discussed the assessment and treatment plan with the patient. The patient was provided an opportunity to ask questions and all were answered. The patient agreed with the plan and demonstrated an understanding  of the instructions.   The patient was advised to call back or seek an in-person evaluation if the symptoms worsen or if the condition fails to improve as anticipated.  Maat Kafer M Eamonn Sermeno, PA-C

## 2022-10-21 NOTE — Assessment & Plan Note (Signed)
TKA Left in 2022 TKA Right in 04/2022  Still has some pain in the evening while trying to sleep and rest Previously did well with Gabapentin low dose - refilled the 100 mg at bedtime  Pt aware of risks vs benefits and possible adverse reactions She will cont PT as she is doing

## 2022-10-22 ENCOUNTER — Encounter (HOSPITAL_BASED_OUTPATIENT_CLINIC_OR_DEPARTMENT_OTHER): Payer: Self-pay | Admitting: Physical Therapy

## 2022-10-22 ENCOUNTER — Other Ambulatory Visit: Payer: Self-pay | Admitting: Physician Assistant

## 2022-10-22 ENCOUNTER — Ambulatory Visit (HOSPITAL_BASED_OUTPATIENT_CLINIC_OR_DEPARTMENT_OTHER): Payer: BC Managed Care – PPO | Admitting: Physical Therapy

## 2022-10-22 DIAGNOSIS — R2689 Other abnormalities of gait and mobility: Secondary | ICD-10-CM

## 2022-10-22 DIAGNOSIS — M6281 Muscle weakness (generalized): Secondary | ICD-10-CM | POA: Diagnosis not present

## 2022-10-22 DIAGNOSIS — M25561 Pain in right knee: Secondary | ICD-10-CM | POA: Diagnosis not present

## 2022-10-22 DIAGNOSIS — I10 Essential (primary) hypertension: Secondary | ICD-10-CM

## 2022-10-22 DIAGNOSIS — G8929 Other chronic pain: Secondary | ICD-10-CM | POA: Diagnosis not present

## 2022-10-22 NOTE — Therapy (Signed)
OUTPATIENT PHYSICAL THERAPY LOWER EXTREMITY TREATMENT   Patient Name: Leah [name] MRN: [id number] DOB:03/15/1960, 62 y.o., female Today's Date: 10/22/2022  END OF SESSION:  PT End of Session - 10/22/22 0832     Visit Number 4    Number of Visits 12    Date for PT Re-Evaluation 12/01/22    Authorization Type BC[employer]    PT Start Time 0825   pt arrived late   PT Stop Time 0900    PT Time Calculation (min) 35 min    Activity Tolerance Patient tolerated treatment well    Behavior During Therapy [facility] for tasks assessed/performed              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   Constipation    Diabetes (HCC)    Dyspnea    with exertion   Fatty liver    Gallbladder problem    GE reflux    Hepatic encephalopathy (HCC) 2011   History of PCR DNA positive for HSV1    History of retinal vein occlusion, right eye    HTN (hypertension) 2011   Joint pain    Liver problem    Neuroendocrine tumor 2011   Orthostatic hypotension    Osteoarthritis    Osteopenia    Palpitations    Sarcoidosis 2008   Sleep apnea    uses cpap   Surgical menopause    Past Surgical History:  Procedure Laterality Date   ABDOMINAL HYSTERECTOMY  2004   AUGMENTATION MAMMAPLASTY     BREAST BIOPSY Left    BREAST RECONSTRUCTION Bilateral 05/07/2020   Procedure: Revision of bilateral breast reconstruction;  Surgeon: Allena Napoleon, MD;  Location: [facility];  Service: Plastics;  Laterality: Bilateral;  2 hours   CESAREAN SECTION  [id number]   CHOLECYSTECTOMY  2006   HERNIA REPAIR     KNEE ARTHROSCOPY Left 09/2010   LAPAROSCOPIC HEPATECTOMY     LIPOSUCTION Bilateral 05/07/2020   Procedure: LIPOSUCTION with excision of lateral breast and axillary tissue;  Surgeon: Allena Napoleon, MD;  Location: [facility];  Service: Plastics;  Laterality: Bilateral;   LIVER SURGERY     MASTECTOMY Bilateral 2008   NASAL SINUS SURGERY     OOPHORECTOMY  2004   RECONSTRUCTION BREAST W/ LATISSIMUS DORSI FLAP Bilateral    TONSILLECTOMY  1976   TOTAL KNEE ARTHROPLASTY Left 10/29/2020   Procedure: TOTAL KNEE ARTHROPLASTY;  Surgeon: Ollen Gross, MD;  Location: [facility];  Service: Orthopedics;  Laterality: Left;   TOTAL KNEE ARTHROPLASTY Right 04/14/2022   Procedure: TOTAL KNEE ARTHROPLASTY;  Surgeon: Ollen Gross, MD;  Location: [facility];  Service: Orthopedics;  Laterality: Right;   TUBAL LIGATION Bilateral 2001   Patient Active Problem List   Diagnosis Date Noted   Primary osteoarthritis of right knee 04/14/2022   Primary osteoarthritis of left knee 10/29/2020   At risk for dehydration 07/04/2020   Depression 07/04/2020   Other fatigue 07/07/2019   Hepatic steatosis 01/03/2019   S/P right knee arthroscopy 10/19/2018   Hyperlipidemia associated with type 2 diabetes mellitus (HCC) 09/01/2018   Class 3 severe obesity with serious comorbidity and body mass index (BMI) of 45.0 to 49.9 in adult (HCC) 09/01/2018   OSA on CPAP 05/04/2018  Solitary pulmonary nodule 05/04/2018   History of prophylactic mastectomy of both breasts 11/04/2017   Preop examination 10/20/2017   Atrophic vaginitis 04/12/2016   Osteopenia 09/27/2015   Branch retinal vein occlusion of left eye 12/12/2014   Hypertensive retinopathy of both eyes 12/12/2014   Lung nodule seen on imaging study 05/01/2014   Shortness of breath on exertion    Type 2 diabetes mellitus with other specified complication (HCC) 03/17/2013   Morbid obesity (HCC) 03/01/2013   Carcinoid syndrome (HCC) 02/21/2013   HSV infection 09/14/2012   Chronic pain of both knees 03/17/2012   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) 10/22/2010   Hypertension associated with type 2 diabetes mellitus (HCC) 10/22/2010   GERD (gastroesophageal reflux disease)  10/22/2010   Allergic rhinitis 10/22/2010   Sarcoidosis 10/22/2010   OA (osteoarthritis) of knee 10/22/2010   Anxiety 10/22/2010    PCP: Bary Leriche, PA-C   REFERRING PROVIDER: Allwardt, Crist Infante, PA-C   REFERRING DIAG: Osteoarthritis of right knee, unspecified osteoarthritis type   THERAPY DIAG:  Chronic pain of right knee  Muscle weakness (generalized)  Other abnormalities of gait and mobility  Rationale for Evaluation and Treatment: Rehabilitation  ONSET DATE: 2022  SUBJECTIVE:   SUBJECTIVE STATEMENT: Pt reports her Rt knee is sore from being busy the last couple days.  She has started exploring memberships to facilities with pools.    Initial subjective: R TKR 2022, It now sounds like I am stepping on a bag of ruffles.  It is not as strong as it used to be.  Left knee 2024 March. Left knee is much better.   PERTINENT HISTORY: Pt PMH includes but is not limited to: HDL, DM II, OSA on CPAP, B mastectomy, carcinoid syndrome, metastatic neuroendocine ca, and L TKA (2022).  PAIN:  Are you having pain? Yes: NPRS scale: current 3/10 Pain location: R knee Pain description: stiffness, ache in R hip  Aggravating factors: walking distances no greater than 5 mins Relieving factors: elevating; sitting  PRECAUTIONS: Fall  RED FLAGS: None   WEIGHT BEARING RESTRICTIONS: No  FALLS:  Has patient fallen in last 6 months? No  LIVING ENVIRONMENT: Lives with: lives with their spouse Lives in: House/apartment Stairs: Yes: External: 3 steps; can reach both Has following equipment at home: Single point cane, Walker - 2 wheeled, Tour manager, and Grab bars   PLOF: Independent  PATIENT GOALS: ease the crackle, strengthen, care of 4 yo grandson/care for.  NEXT MD VISIT: oncologist  OBJECTIVE:    PATIENT SURVEYS:  LEFS:28/80  COGNITION: Overall cognitive status: Within functional limits for tasks assessed     SENSATION: WFL  EDEMA:  none  MUSCLE  LENGTH: Hamstrings:full   POSTURE: forward head  PALPATION: Left knee crepitus.  LOWER EXTREMITY ROM:  Active ROM Right eval Left eval  Hip flexion wfl wfl  Hip extension    Hip abduction    Hip adduction    Hip internal rotation    Hip external rotation    Knee flexion 125 125  Knee extension 0 0  Ankle dorsiflexion    Ankle plantarflexion    Ankle inversion    Ankle eversion     (Blank rows = not tested)  LOWER EXTREMITY MMT:  MMT Right eval Left eval  Hip flexion 49.1 50.9  Hip extension    Hip abduction 34.5 33.4  Hip adduction    Hip internal rotation    Hip external rotation    Knee flexion  Knee extension 30.7 34.2  Ankle dorsiflexion    Ankle plantarflexion    Ankle inversion    Ankle eversion     (Blank rows = not tested)   FUNCTIONAL TESTS:  5 times sit to stand: 13.80 Timed up and go (TUG): 11.31 4 stage:SLS x 5s 3 min walk test:45ft around pool.  10/22/22:  5x STS: 11.95s  GAIT: Distance walked: 400 Assistive device utilized: None Level of assistance: SBA Comments: Slowed cadence, shorted step length, decreased hip flex with swing   TODAY'S TREATMENT:                                                                                                                              5x STS prior to entry in water  Pt seen for aquatic therapy today.  Treatment took place in water 3.5-4.75 ft in depth at the [facility] pool. Temp of water was 91.  Pt entered/exited the pool via stairs using step to pattern with hand rail.  *Without support: walking forward and backward , side stepping -> side stepping with arm addct with rainbow hand floats x 1 lap * farmer carry with rainbow hand floats at side, marching forward/ backward  * UE on rainbow hand floats:  heel raises x 10; LE swings into hip flex/ ext x 10 and hip abdct/ addct;  forward walking kicks;  * without support: SLS x 20s x 2 reps each  * straddling yellow noodle with  breast stroke arms: gentle cycling * standing quad stretch with foot on 2nd step, UE on rails, 20s x 2 each LE   Pt requires the buoyancy and hydrostatic pressure of water for support, and to offload joints by unweighting joint load by at least 50 % in navel deep water and by at least 75-80% in chest to neck deep water.  Viscosity of the water is needed for resistance of strengthening. Water current perturbations provides challenge to standing balance requiring increased core activation.  PATIENT EDUCATION:  Education details: aquatic therapy exercise rationale, progressions/ modifications  Person educated: Patient  Education method: Explanation Education comprehension: verbalized understanding  HOME EXERCISE PROGRAM: Pt following land based HEP given to her from last session of therapy ([facility])  ASSESSMENT:  CLINICAL IMPRESSION: Pt demonstrated improved speed with 5x STS; progressing towards   Session shortened due to pt's late arrival.  Good progression of exercise without any increase in pain, just report of "crunching" in knees.  Progressing towards goals.     Initial Impression Patient is a 62 y.o. f who was seen today for physical therapy evaluation and treatment for right knee pain s/p TKR 2022. Has had a Left TKR March 2024.  Pt complains of left knee "cracking and popping" with some minimal pain associated. Crepitus palpated with left knee extension supports complaints. Her ROM bilat knees are good.  Strength in LE are symmetrical although weakness is noted throughout. She reports she is raising her 4yo  grandson and needs to be able to get on floor with him as well as left objects she would not be lifting otherwise. She is battling metastatic liver cancer which may decrease her ability to tolerate therapy and slow her progression.  She states she does get winded quickly with walking up to 5 mins. She will benefit from skilled physical therapy intervention.  Will begin in aquatic  setting to use the properties of water to progress strength and improve balance in low loaded and safe setting then will progress to land based for added strengthening, balance and gait for improvement in functional mobility and endurance.  OBJECTIVE IMPAIRMENTS: Abnormal gait, decreased balance, decreased endurance, difficulty walking, decreased strength, and pain.   ACTIVITY LIMITATIONS: lifting, bending, squatting, stairs, and locomotion level  PARTICIPATION LIMITATIONS: cleaning, shopping, community activity, and yard work  PERSONAL FACTORS: Fitness, Past/current experiences, and 1 comorbidity: Liver ca  are also affecting patient's functional outcome.   REHAB POTENTIAL: Good  CLINICAL DECISION MAKING: Evolving/moderate complexity  EVALUATION COMPLEXITY: Moderate   GOALS: Goals reviewed with patient? Yes  SHORT TERM GOALS: Target date: Sept 27, 2024 Pt will tolerate full aquatic sessions consistently without increase in pain and with improving function to demonstrate good toleration and effectiveness of intervention.  Baseline: Goal status: INITIAL  2.  Pt will improve on 5 X STS test to <or= 10  to demonstrate improving functional lower extremity strength, transitional movements, and balance Baseline: 13.80 at eval,  11.90 on 10/22/22 Goal status: In progress    3.  Pt will be able to complete SLS R/L x 20s on bottom step in pool to demonstrate improving balance Baseline: 5 s land Goal status: INITIAL  4. Pt will be instructed and indep with rising from floor.  Baseline: can do it but its hard  Goal: INITIAL    LONG TERM GOALS: Target date: Dec 01, 2022  Pt will improve on LEFS by at least 9 points to demonstrate statistically significant change in pt functional ability Baseline: 28/80 Goal status: INITIAL  2.  Walk through grocery store pushing cart to demonstrate improved toleration to amb Baseline: unable Goal status: INITIAL  3.  3 MWT to improve to 677ft to  demonstrate improved toleration to amb. Baseline: 400 Goal status: INITIAL  4.  PT will improve SLS up to or >15s Baseline: 5 Goal status: INITIAL  5.  5.  Pt will be indep with final HEP's (land and aquatic as appropriate) for continued management of condition Baseline:  Goal status: INITIAL  6.  Pt will improve strength in all areas listed by at least 10lbs to demonstrate improved overall physical function Baseline: see chart Goal status: INITIAL   PLAN:  PT FREQUENCY: 1-2x/week  PT DURATION: 8 weeks 12 visit.  Extended weeks to allow for scheduling conflicts  PLANNED INTERVENTIONS: Therapeutic exercises, Therapeutic activity, Neuromuscular re-education, Balance training, Gait training, Patient/Family education, Self Care, Joint mobilization, Joint manipulation, Stair training, Orthotic/Fit training, DME instructions, Aquatic Therapy, Dry Needling, Electrical stimulation, Cryotherapy, Moist heat, scar mobilization, Taping, Ultrasound, Ionotophoresis 4mg /ml Dexamethasone, Manual therapy, and Re-evaluation  PLAN FOR NEXT SESSION: Aquatics: LE and core strengthening; balance retraining; gait and endurance retraining Land: instruct on use of approp equipment (pt to join gym), le strengthening, floor transfers, body mechanics.  Mayer Camel, PTA 10/22/22 11:01 AM [facility] [address] [place], [place], [address] Phone: [phone]   Fax:  [phone]

## 2022-10-23 ENCOUNTER — Other Ambulatory Visit: Payer: Self-pay | Admitting: *Deleted

## 2022-10-23 ENCOUNTER — Other Ambulatory Visit: Payer: Self-pay | Admitting: Family Medicine

## 2022-10-23 DIAGNOSIS — C7B8 Other secondary neuroendocrine tumors: Secondary | ICD-10-CM

## 2022-10-24 ENCOUNTER — Inpatient Hospital Stay: Payer: BC Managed Care – PPO | Attending: Hematology & Oncology

## 2022-10-24 ENCOUNTER — Inpatient Hospital Stay: Payer: BC Managed Care – PPO

## 2022-10-24 VITALS — BP 127/83 | HR 72 | Temp 97.0°F | Resp 16

## 2022-10-24 DIAGNOSIS — C7B02 Secondary carcinoid tumors of liver: Secondary | ICD-10-CM | POA: Insufficient documentation

## 2022-10-24 DIAGNOSIS — C7B8 Other secondary neuroendocrine tumors: Secondary | ICD-10-CM

## 2022-10-24 DIAGNOSIS — C7A00 Malignant carcinoid tumor of unspecified site: Secondary | ICD-10-CM | POA: Diagnosis not present

## 2022-10-24 LAB — CMP (CANCER CENTER ONLY)
ALT: 11 U/L (ref 0–44)
AST: 13 U/L — ABNORMAL LOW (ref 15–41)
Albumin: 4.3 g/dL (ref 3.5–5.0)
Alkaline Phosphatase: 68 U/L (ref 38–126)
Anion gap: 7 (ref 5–15)
BUN: 13 mg/dL (ref 8–23)
CO2: 31 mmol/L (ref 22–32)
Calcium: 10 mg/dL (ref 8.9–10.3)
Chloride: 103 mmol/L (ref 98–111)
Creatinine: 0.85 mg/dL (ref 0.44–1.00)
GFR, Estimated: >60 mL/min (ref >60)
Glucose, Bld: 111 mg/dL — ABNORMAL HIGH (ref 70–99)
Potassium: 5.5 mmol/L — ABNORMAL HIGH (ref 3.5–5.1)
Sodium: 141 mmol/L (ref 135–145)
Total Bilirubin: 0.6 mg/dL (ref 0.3–1.2)
Total Protein: 7.4 g/dL (ref 6.5–8.1)

## 2022-10-24 LAB — CBC WITH DIFFERENTIAL (CANCER CENTER ONLY)
Abs Immature Granulocytes: 0.02 10*3/uL (ref 0.00–0.07)
Basophils Absolute: 0.1 10*3/uL (ref 0.0–0.1)
Basophils Relative: 1 %
Eosinophils Absolute: 0.3 10*3/uL (ref 0.0–0.5)
Eosinophils Relative: 3 %
HCT: 41.4 % (ref 36.0–46.0)
Hemoglobin: 13.3 g/dL (ref 12.0–15.0)
Immature Granulocytes: 0 %
Lymphocytes Relative: 31 %
Lymphs Abs: 3.1 10*3/uL (ref 0.7–4.0)
MCH: 29.6 pg (ref 26.0–34.0)
MCHC: 32.1 g/dL (ref 30.0–36.0)
MCV: 92 fL (ref 80.0–100.0)
Monocytes Absolute: 0.6 10*3/uL (ref 0.1–1.0)
Monocytes Relative: 6 %
Neutro Abs: 6 10*3/uL (ref 1.7–7.7)
Neutrophils Relative %: 59 %
Platelet Count: 283 10*3/uL (ref 150–400)
RBC: 4.5 MIL/uL (ref 3.87–5.11)
RDW: 13.2 % (ref 11.5–15.5)
WBC Count: 10.1 10*3/uL (ref 4.0–10.5)
nRBC: 0 % (ref 0.0–0.2)

## 2022-10-24 MED ORDER — LANREOTIDE ACETATE 120 MG/0.5ML ~~LOC~~ SOLN
120.0000 mg | Freq: Once | SUBCUTANEOUS | Status: AC
  Administered 2022-10-24: 120 mg via SUBCUTANEOUS
  Filled 2022-10-24: qty 120

## 2022-10-24 MED ORDER — LANREOTIDE ACETATE 120 MG/0.5ML ~~LOC~~ SOLN: 120.0000 mg | Freq: Once | SUBCUTANEOUS | Status: DC

## 2022-10-24 NOTE — Patient Instructions (Signed)
Lanreotide Injection What is this medication? LANREOTIDE (lan REE oh tide) treats high levels of growth hormone (acromegaly). It is used when other therapies have not worked well enough or cannot be tolerated. It works by reducing the amount of growth hormone your body makes. This reduces symptoms and the risk of health problems caused by too much growth hormone, such as diabetes and heart disease. It may also be used to treat neuroendocrine tumors, a cancer of the cells that release hormones and other substances in your body. It works by slowing down the release of these substances from the cells. This slows tumor growth. It also decreases the symptoms of carcinoid syndrome, such as flushing or diarrhea. This medicine may be used for other purposes; ask your health care provider or pharmacist if you have questions. COMMON BRAND NAME(S): Somatuline Depot What should I tell my care team before I take this medication? They need to know if you have any of these conditions: Diabetes Gallbladder disease Heart disease Kidney disease Liver disease Thyroid disease An unusual or allergic reaction to lanreotide, other medications, foods, dyes, or preservatives Pregnant or trying to get pregnant Breast-feeding How should I use this medication? This medication is injected under the skin. It is given by your care team in a hospital or clinic setting. Talk to your care team about the use of this medication in children. Special care may be needed. Overdosage: If you think you have taken too much of this medicine contact a poison control center or emergency room at once. NOTE: This medicine is only for you. Do not share this medicine with others. What if I miss a dose? Keep appointments for follow-up doses. It is important not to miss your dose. Call your care team if you are unable to keep an appointment. What may interact with this medication? Bromocriptine Cyclosporine Certain medications for blood  pressure, heart disease, irregular heartbeat Certain medications for diabetes Quinidine Terfenadine This list may not describe all possible interactions. Give your health care provider a list of all the medicines, herbs, non-prescription drugs, or dietary supplements you use. Also tell them if you smoke, drink alcohol, or use illegal drugs. Some items may interact with your medicine. What should I watch for while using this medication? Visit your care team for regular checks on your progress. Tell your care team if your symptoms do not start to get better or if they get worse. Your condition will be monitored carefully while you are receiving this medication. You may need blood work while you are taking this medication. This medication may increase blood sugar. The risk may be higher in patients who already have diabetes. Ask your care team what you can do to lower your risk of diabetes while taking this medication. Talk to your care team if you wish to become pregnant or think you may be pregnant. This medication can cause serious birth defects. Do not breast-feed while taking this medication and for 6 months after stopping therapy. This medication may cause infertility. Talk to your care team if you are concerned about your fertility. What side effects may I notice from receiving this medication? Side effects that you should report to your care team as soon as possible: Allergic reactions--skin rash, itching, hives, swelling of the face, lips, tongue, or throat Gallbladder problems--severe stomach pain, nausea, vomiting, fever High blood sugar (hyperglycemia)--increased thirst or amount of urine, unusual weakness or fatigue, blurry vision Increase in blood pressure Low blood sugar (hypoglycemia)--tremors or shaking, anxiety, sweating, cold  or clammy skin, confusion, dizziness, rapid heartbeat Low thyroid levels (hypothyroidism)--unusual weakness or fatigue, increased sensitivity to cold,  constipation, hair loss, dry skin, weight gain, feelings of depression Slow heartbeat--dizziness, feeling faint or lightheaded, confusion, trouble breathing, unusual weakness or fatigue Side effects that usually do not require medical attention (report to your care team if they continue or are bothersome): Diarrhea Dizziness Headache Muscle spasms Nausea Pain, redness, irritation, or bruising at the injection site Stomach pain This list may not describe all possible side effects. Call your doctor for medical advice about side effects. You may report side effects to FDA at 1-800-FDA-1088. Where should I keep my medication? This medication is given in a hospital or clinic. It will not be stored at home. NOTE: This sheet is a summary. It may not cover all possible information. If you have questions about this medicine, talk to your doctor, pharmacist, or health care provider.  2024 Elsevier/Gold Standard (2021-06-19 00:00:00)

## 2022-10-24 NOTE — Telephone Encounter (Signed)
Last refill by historical provider

## 2022-10-27 ENCOUNTER — Other Ambulatory Visit: Payer: Self-pay | Admitting: Physician Assistant

## 2022-10-27 DIAGNOSIS — Z1231 Encounter for screening mammogram for malignant neoplasm of breast: Secondary | ICD-10-CM

## 2022-10-28 ENCOUNTER — Encounter (HOSPITAL_BASED_OUTPATIENT_CLINIC_OR_DEPARTMENT_OTHER): Payer: Self-pay | Admitting: Physical Therapy

## 2022-10-28 ENCOUNTER — Ambulatory Visit (HOSPITAL_BASED_OUTPATIENT_CLINIC_OR_DEPARTMENT_OTHER): Payer: BC Managed Care – PPO | Admitting: Physical Therapy

## 2022-10-28 DIAGNOSIS — R2689 Other abnormalities of gait and mobility: Secondary | ICD-10-CM | POA: Diagnosis not present

## 2022-10-28 DIAGNOSIS — G8929 Other chronic pain: Secondary | ICD-10-CM

## 2022-10-28 DIAGNOSIS — M6281 Muscle weakness (generalized): Secondary | ICD-10-CM | POA: Diagnosis not present

## 2022-10-28 DIAGNOSIS — M25561 Pain in right knee: Secondary | ICD-10-CM | POA: Diagnosis not present

## 2022-10-28 NOTE — Therapy (Signed)
OUTPATIENT PHYSICAL THERAPY LOWER EXTREMITY TREATMENT   Patient Name: Leah [name] MRN: [id number] DOB:07/23/1960, 62 y.o., female Today's Date: 10/28/2022  END OF SESSION:  PT End of Session - 10/28/22 1039     Visit Number 5    Number of Visits 12    Date for PT Re-Evaluation 12/01/22    Authorization Type [employer]    PT Start Time [phone]    PT Stop Time 1025    PT Time Calculation (min) 39 min    Activity Tolerance Patient tolerated treatment well    Behavior During Therapy [facility] for tasks assessed/performed               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   Constipation    Diabetes (HCC)    Dyspnea    with exertion   Fatty liver    Gallbladder problem    GE reflux    Hepatic encephalopathy (HCC) 2011   History of PCR DNA positive for HSV1    History of retinal vein occlusion, right eye    HTN (hypertension) 2011   Joint pain    Liver problem    Neuroendocrine tumor 2011   Orthostatic hypotension    Osteoarthritis    Osteopenia    Palpitations    Sarcoidosis 2008   Sleep apnea    uses cpap   Surgical menopause    Past Surgical History:  Procedure Laterality Date   ABDOMINAL HYSTERECTOMY  2004   AUGMENTATION MAMMAPLASTY     BREAST BIOPSY Left    BREAST RECONSTRUCTION Bilateral 05/07/2020   Procedure: Revision of bilateral breast reconstruction;  Surgeon: Allena Napoleon, MD;  Location: [facility];  Service: Plastics;  Laterality: Bilateral;  2 hours   CESAREAN SECTION  [id number]   CHOLECYSTECTOMY  2006   HERNIA REPAIR     KNEE ARTHROSCOPY Left 09/2010   LAPAROSCOPIC HEPATECTOMY     LIPOSUCTION Bilateral 05/07/2020   Procedure: LIPOSUCTION with excision of lateral breast and axillary tissue;  Surgeon: Allena Napoleon, MD;  Location: [facility];   Service: Plastics;  Laterality: Bilateral;   LIVER SURGERY     MASTECTOMY Bilateral 2008   NASAL SINUS SURGERY     OOPHORECTOMY  2004   RECONSTRUCTION BREAST W/ LATISSIMUS DORSI FLAP Bilateral    TONSILLECTOMY  1976   TOTAL KNEE ARTHROPLASTY Left 10/29/2020   Procedure: TOTAL KNEE ARTHROPLASTY;  Surgeon: Ollen Gross, MD;  Location: [facility];  Service: Orthopedics;  Laterality: Left;   TOTAL KNEE ARTHROPLASTY Right 04/14/2022   Procedure: TOTAL KNEE ARTHROPLASTY;  Surgeon: Ollen Gross, MD;  Location: [facility];  Service: Orthopedics;  Laterality: Right;   TUBAL LIGATION Bilateral 2001   Patient Active Problem List   Diagnosis Date Noted   Primary osteoarthritis of right knee 04/14/2022   Primary osteoarthritis of left knee 10/29/2020   At risk for dehydration 07/04/2020   Depression 07/04/2020   Other fatigue 07/07/2019   Hepatic steatosis 01/03/2019   S/P right knee arthroscopy 10/19/2018   Hyperlipidemia associated with type 2 diabetes mellitus (HCC) 09/01/2018   Class 3 severe obesity with serious comorbidity and body mass index (BMI) of 45.0 to 49.9 in adult (HCC) 09/01/2018   OSA on CPAP 05/04/2018   Solitary  pulmonary nodule 05/04/2018   History of prophylactic mastectomy of both breasts 11/04/2017   Preop examination 10/20/2017   Atrophic vaginitis 04/12/2016   Osteopenia 09/27/2015   Branch retinal vein occlusion of left eye 12/12/2014   Hypertensive retinopathy of both eyes 12/12/2014   Lung nodule seen on imaging study 05/01/2014   Shortness of breath on exertion    Type 2 diabetes mellitus with other specified complication (HCC) 03/17/2013   Morbid obesity (HCC) 03/01/2013   Carcinoid syndrome (HCC) 02/21/2013   HSV infection 09/14/2012   Chronic pain of both knees 03/17/2012   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) 10/22/2010   Hypertension associated with type 2 diabetes mellitus (HCC) 10/22/2010   GERD (gastroesophageal reflux disease) 10/22/2010    Allergic rhinitis 10/22/2010   Sarcoidosis 10/22/2010   OA (osteoarthritis) of knee 10/22/2010   Anxiety 10/22/2010    PCP: Bary Leriche, PA-C   REFERRING PROVIDER: Allwardt, Crist Infante, PA-C   REFERRING DIAG: Osteoarthritis of right knee, unspecified osteoarthritis type   THERAPY DIAG:  Chronic pain of right knee  Muscle weakness (generalized)  Other abnormalities of gait and mobility  Rationale for Evaluation and Treatment: Rehabilitation  ONSET DATE: 2022  SUBJECTIVE:   SUBJECTIVE STATEMENT: Pt reports walking through a grocery store pushing a cart x 2 and finding that her knees feel better the more she exercises.    Initial subjective: R TKR 2022, It now sounds like I am stepping on a bag of ruffles.  It is not as strong as it used to be.  Left knee 2024 March. Left knee is much better.   PERTINENT HISTORY: Pt PMH includes but is not limited to: HDL, DM II, OSA on CPAP, B mastectomy, carcinoid syndrome, metastatic neuroendocine ca, and L TKA (2022).  PAIN:  Are you having pain? Yes: NPRS scale: current 4/10 Pain location: R knee Pain description: stiffness, ache in R hip  Aggravating factors: walking distances no greater than 5 mins Relieving factors: elevating; sitting  PRECAUTIONS: Fall  RED FLAGS: None   WEIGHT BEARING RESTRICTIONS: No  FALLS:  Has patient fallen in last 6 months? No  LIVING ENVIRONMENT: Lives with: lives with their spouse Lives in: House/apartment Stairs: Yes: External: 3 steps; can reach both Has following equipment at home: Single point cane, Walker - 2 wheeled, Tour manager, and Grab bars   PLOF: Independent  PATIENT GOALS: ease the crackle, strengthen, care of 4 yo grandson/care for.  NEXT MD VISIT: oncologist  OBJECTIVE:    PATIENT SURVEYS:  LEFS:28/80  COGNITION: Overall cognitive status: Within functional limits for tasks assessed     SENSATION: WFL  EDEMA:  none  MUSCLE  LENGTH: Hamstrings:full   POSTURE: forward head  PALPATION: Left knee crepitus.  LOWER EXTREMITY ROM:  Active ROM Right eval Left eval  Hip flexion wfl wfl  Hip extension    Hip abduction    Hip adduction    Hip internal rotation    Hip external rotation    Knee flexion 125 125  Knee extension 0 0  Ankle dorsiflexion    Ankle plantarflexion    Ankle inversion    Ankle eversion     (Blank rows = not tested)  LOWER EXTREMITY MMT:  MMT Right eval Left eval  Hip flexion 49.1 50.9  Hip extension    Hip abduction 34.5 33.4  Hip adduction    Hip internal rotation    Hip external rotation    Knee flexion    Knee  extension 30.7 34.2  Ankle dorsiflexion    Ankle plantarflexion    Ankle inversion    Ankle eversion     (Blank rows = not tested)   FUNCTIONAL TESTS:  5 times sit to stand: 13.80 Timed up and go (TUG): 11.31 4 stage:SLS x 5s 3 min walk test:453ft around pool.  10/22/22:  5x STS: 11.95s  GAIT: Distance walked: 400 Assistive device utilized: None Level of assistance: SBA Comments: Slowed cadence, shorted step length, decreased hip flex with swing   TODAY'S TREATMENT:                                                                                                                                Pt seen for aquatic therapy today.  Treatment took place in water 3.5-4.75 ft in depth at the [facility] pool. Temp of water was 91.  Pt entered/exited the pool via stairs using step to pattern with hand rail.  *Without support: walking forward and backward , side stepping *slight side lunge with arm addct with rainbow hand floats x 4 widths * farmer carry with rainbow hand floats at side, marching forward/ backward  *quad and hip flex stretch using noodle ue support on wall.  Min assist needed to get into position. *Step ups bottom step leading R/L x10. No Ue support once exercise initiated *cycling on noodle x 6 widths *SLS R/L 3.6 ft yellow  HB-> rainbow HB->unsupported.  Progressed to bottom step ue support on hand rails as needed *UE support on wall: curtsy squats x10 * UE on rainbow hand floats:  LE swings into hip flex/ ext x 10; relaxed squats, traditional squats   Pt requires the buoyancy and hydrostatic pressure of water for support, and to offload joints by unweighting joint load by at least 50 % in navel deep water and by at least 75-80% in chest to neck deep water.  Viscosity of the water is needed for resistance of strengthening. Water current perturbations provides challenge to standing balance requiring increased core activation.  PATIENT EDUCATION:  Education details: aquatic therapy exercise rationale, progressions/ modifications  Person educated: Patient  Education method: Explanation Education comprehension: verbalized understanding  HOME EXERCISE PROGRAM: Pt following land based HEP given to her from last session of therapy ([facility])  ASSESSMENT:  CLINICAL IMPRESSION: Pt tolerates full session of therapy without rest period.  She has improved toleration to amb as she has begun walking through grocery stores which she was unable to do in past progressing towards a LTG. 1 STG met.  SLS balance improving as she is able to hold position on bottom step after several tries x4-5s.  Goals ongoing.     Initial Impression Patient is a 62 y.o. f who was seen today for physical therapy evaluation and treatment for right knee pain s/p TKR 2022. Has had a Left TKR March 2024.  Pt complains of left knee "cracking and popping" with some minimal pain  associated. Crepitus palpated with left knee extension supports complaints. Her ROM bilat knees are good.  Strength in LE are symmetrical although weakness is noted throughout. She reports she is raising her 4yo grandson and needs to be able to get on floor with him as well as left objects she would not be lifting otherwise. She is battling metastatic liver cancer which may  decrease her ability to tolerate therapy and slow her progression.  She states she does get winded quickly with walking up to 5 mins. She will benefit from skilled physical therapy intervention.  Will begin in aquatic setting to use the properties of water to progress strength and improve balance in low loaded and safe setting then will progress to land based for added strengthening, balance and gait for improvement in functional mobility and endurance.  OBJECTIVE IMPAIRMENTS: Abnormal gait, decreased balance, decreased endurance, difficulty walking, decreased strength, and pain.   ACTIVITY LIMITATIONS: lifting, bending, squatting, stairs, and locomotion level  PARTICIPATION LIMITATIONS: cleaning, shopping, community activity, and yard work  PERSONAL FACTORS: Fitness, Past/current experiences, and 1 comorbidity: Liver ca  are also affecting patient's functional outcome.   REHAB POTENTIAL: Good  CLINICAL DECISION MAKING: Evolving/moderate complexity  EVALUATION COMPLEXITY: Moderate   GOALS: Goals reviewed with patient? Yes  SHORT TERM GOALS: Target date: Sept 27, 2024 Pt will tolerate full aquatic sessions consistently without increase in pain and with improving function to demonstrate good toleration and effectiveness of intervention.  Baseline: Goal status:10/28/22 Met  2.  Pt will improve on 5 X STS test to <or= 10  to demonstrate improving functional lower extremity strength, transitional movements, and balance Baseline: 13.80 at eval,  11.90 on 10/22/22 Goal status: In progress    3.  Pt will be able to complete SLS R/L x 20s on bottom step in pool to demonstrate improving balance Baseline: 5 s land Goal status: INITIAL  4. Pt will be instructed and indep with rising from floor.  Baseline: can do it but its hard  Goal: INITIAL    LONG TERM GOALS: Target date: Dec 01, 2022  Pt will improve on LEFS by at least 9 points to demonstrate statistically significant change in pt  functional ability Baseline: 28/80 Goal status: INITIAL  2.  Walk through grocery store pushing cart to demonstrate improved toleration to amb Baseline: unable Goal status: In Progress 10/28/22  3.  3 MWT to improve to 680ft to demonstrate improved toleration to amb. Baseline: 400 Goal status: INITIAL  4.  PT will improve SLS up to or >15s Baseline: 5 Goal status: INITIAL  5.  5.  Pt will be indep with final HEP's (land and aquatic as appropriate) for continued management of condition Baseline:  Goal status: INITIAL  6.  Pt will improve strength in all areas listed by at least 10lbs to demonstrate improved overall physical function Baseline: see chart Goal status: INITIAL   PLAN:  PT FREQUENCY: 1-2x/week  PT DURATION: 8 weeks 12 visit.  Extended weeks to allow for scheduling conflicts  PLANNED INTERVENTIONS: Therapeutic exercises, Therapeutic activity, Neuromuscular re-education, Balance training, Gait training, Patient/Family education, Self Care, Joint mobilization, Joint manipulation, Stair training, Orthotic/Fit training, DME instructions, Aquatic Therapy, Dry Needling, Electrical stimulation, Cryotherapy, Moist heat, scar mobilization, Taping, Ultrasound, Ionotophoresis 4mg /ml Dexamethasone, Manual therapy, and Re-evaluation  PLAN FOR NEXT SESSION: Aquatics: LE and core strengthening; balance retraining; gait and endurance retraining Land: instruct on use of approp equipment (pt to join gym), le strengthening, floor transfers, body mechanics.  Corrie Dandy Tomma Lightning)  Kathryn Linarez MPT 10/28/22 10:41 AM [facility] [address] [place], [place], [address] Phone: [phone]   Fax:  [phone]

## 2022-10-30 ENCOUNTER — Encounter (HOSPITAL_BASED_OUTPATIENT_CLINIC_OR_DEPARTMENT_OTHER): Payer: Self-pay | Admitting: Physical Therapy

## 2022-10-30 ENCOUNTER — Ambulatory Visit (HOSPITAL_BASED_OUTPATIENT_CLINIC_OR_DEPARTMENT_OTHER): Payer: BC Managed Care – PPO | Admitting: Physical Therapy

## 2022-10-30 DIAGNOSIS — G8929 Other chronic pain: Secondary | ICD-10-CM

## 2022-10-30 DIAGNOSIS — M6281 Muscle weakness (generalized): Secondary | ICD-10-CM

## 2022-10-30 DIAGNOSIS — R2689 Other abnormalities of gait and mobility: Secondary | ICD-10-CM

## 2022-10-30 DIAGNOSIS — M25561 Pain in right knee: Secondary | ICD-10-CM | POA: Diagnosis not present

## 2022-10-30 NOTE — Therapy (Signed)
OUTPATIENT PHYSICAL THERAPY LOWER EXTREMITY TREATMENT   Patient Name: Leah [name] MRN: [id number] DOB:11/20/1960, 62 y.o., female Today's Date: 10/30/2022  END OF SESSION:  PT End of Session - 10/30/22 0930     Visit Number 6    Number of Visits 12    Date for PT Re-Evaluation 12/01/22    Authorization Type [employer]    PT Start Time 0932    PT Stop Time 1012    PT Time Calculation (min) 40 min    Activity Tolerance Patient tolerated treatment well    Behavior During Therapy [facility] for tasks assessed/performed               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   Constipation    Diabetes (HCC)    Dyspnea    with exertion   Fatty liver    Gallbladder problem    GE reflux    Hepatic encephalopathy (HCC) 2011   History of PCR DNA positive for HSV1    History of retinal vein occlusion, right eye    HTN (hypertension) 2011   Joint pain    Liver problem    Neuroendocrine tumor 2011   Orthostatic hypotension    Osteoarthritis    Osteopenia    Palpitations    Sarcoidosis 2008   Sleep apnea    uses cpap   Surgical menopause    Past Surgical History:  Procedure Laterality Date   ABDOMINAL HYSTERECTOMY  2004   AUGMENTATION MAMMAPLASTY     BREAST BIOPSY Left    BREAST RECONSTRUCTION Bilateral 05/07/2020   Procedure: Revision of bilateral breast reconstruction;  Surgeon: Allena Napoleon, MD;  Location: [facility];  Service: Plastics;  Laterality: Bilateral;  2 hours   CESAREAN SECTION  8413,[id number]   CHOLECYSTECTOMY  2006   HERNIA REPAIR     KNEE ARTHROSCOPY Left 09/2010   LAPAROSCOPIC HEPATECTOMY     LIPOSUCTION Bilateral 05/07/2020   Procedure: LIPOSUCTION with excision of lateral breast and axillary tissue;  Surgeon: Allena Napoleon, MD;  Location: [facility];   Service: Plastics;  Laterality: Bilateral;   LIVER SURGERY     MASTECTOMY Bilateral 2008   NASAL SINUS SURGERY     OOPHORECTOMY  2004   RECONSTRUCTION BREAST W/ LATISSIMUS DORSI FLAP Bilateral    TONSILLECTOMY  1976   TOTAL KNEE ARTHROPLASTY Left 10/29/2020   Procedure: TOTAL KNEE ARTHROPLASTY;  Surgeon: Ollen Gross, MD;  Location: [facility];  Service: Orthopedics;  Laterality: Left;   TOTAL KNEE ARTHROPLASTY Right 04/14/2022   Procedure: TOTAL KNEE ARTHROPLASTY;  Surgeon: Ollen Gross, MD;  Location: [facility];  Service: Orthopedics;  Laterality: Right;   TUBAL LIGATION Bilateral 2001   Patient Active Problem List   Diagnosis Date Noted   Primary osteoarthritis of right knee 04/14/2022   Primary osteoarthritis of left knee 10/29/2020   At risk for dehydration 07/04/2020   Depression 07/04/2020   Other fatigue 07/07/2019   Hepatic steatosis 01/03/2019   S/P right knee arthroscopy 10/19/2018   Hyperlipidemia associated with type 2 diabetes mellitus (HCC) 09/01/2018   Class 3 severe obesity with serious comorbidity and body mass index (BMI) of 45.0 to 49.9 in adult (HCC) 09/01/2018   OSA on CPAP 05/04/2018   Solitary  pulmonary nodule 05/04/2018   History of prophylactic mastectomy of both breasts 11/04/2017   Preop examination 10/20/2017   Atrophic vaginitis 04/12/2016   Osteopenia 09/27/2015   Branch retinal vein occlusion of left eye 12/12/2014   Hypertensive retinopathy of both eyes 12/12/2014   Lung nodule seen on imaging study 05/01/2014   Shortness of breath on exertion    Type 2 diabetes mellitus with other specified complication (HCC) 03/17/2013   Morbid obesity (HCC) 03/01/2013   Carcinoid syndrome (HCC) 02/21/2013   HSV infection 09/14/2012   Chronic pain of both knees 03/17/2012   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) 10/22/2010   Hypertension associated with type 2 diabetes mellitus (HCC) 10/22/2010   GERD (gastroesophageal reflux disease) 10/22/2010    Allergic rhinitis 10/22/2010   Sarcoidosis 10/22/2010   OA (osteoarthritis) of knee 10/22/2010   Anxiety 10/22/2010    PCP: Bary Leriche, PA-C   REFERRING PROVIDER: Allwardt, Crist Infante, PA-C   REFERRING DIAG: Osteoarthritis of right knee, unspecified osteoarthritis type   THERAPY DIAG:  Chronic pain of right knee  Muscle weakness (generalized)  Other abnormalities of gait and mobility  Rationale for Evaluation and Treatment: Rehabilitation  ONSET DATE: 2022  SUBJECTIVE:   SUBJECTIVE STATEMENT: Pt reports she likes the aquatic therapy. Knee is crunchy. Stairs are difficulty.    Initial subjective: R TKR 2022, It now sounds like I am stepping on a bag of ruffles.  It is not as strong as it used to be.  Left knee 2024 March. Left knee is much better.   PERTINENT HISTORY: Pt PMH includes but is not limited to: HDL, DM II, OSA on CPAP, B mastectomy, carcinoid syndrome, metastatic neuroendocine ca, and L TKA (2022).  PAIN:  Are you having pain? Yes: NPRS scale: current 3/10 Pain location: R knee Pain description: stiffness, ache in R hip  Aggravating factors: walking distances no greater than 5 mins Relieving factors: elevating; sitting  PRECAUTIONS: Fall  RED FLAGS: None   WEIGHT BEARING RESTRICTIONS: No  FALLS:  Has patient fallen in last 6 months? No  LIVING ENVIRONMENT: Lives with: lives with their spouse Lives in: House/apartment Stairs: Yes: External: 3 steps; can reach both Has following equipment at home: Single point cane, Walker - 2 wheeled, Tour manager, and Grab bars   PLOF: Independent  PATIENT GOALS: ease the crackle, strengthen, care of 4 yo grandson/care for.  NEXT MD VISIT: oncologist  OBJECTIVE:    PATIENT SURVEYS:  LEFS:28/80  COGNITION: Overall cognitive status: Within functional limits for tasks assessed     SENSATION: WFL  EDEMA:  none  MUSCLE LENGTH: Hamstrings:full   POSTURE: forward head  PALPATION: Left  knee crepitus.  LOWER EXTREMITY ROM:  Active ROM Right eval Left eval  Hip flexion wfl wfl  Hip extension    Hip abduction    Hip adduction    Hip internal rotation    Hip external rotation    Knee flexion 125 125  Knee extension 0 0  Ankle dorsiflexion    Ankle plantarflexion    Ankle inversion    Ankle eversion     (Blank rows = not tested)  LOWER EXTREMITY MMT:  MMT Right eval Left eval  Hip flexion 49.1 50.9  Hip extension    Hip abduction 34.5 33.4  Hip adduction    Hip internal rotation    Hip external rotation    Knee flexion    Knee extension 30.7 34.2  Ankle dorsiflexion    Ankle  plantarflexion    Ankle inversion    Ankle eversion     (Blank rows = not tested)   FUNCTIONAL TESTS:  5 times sit to stand: 13.80 Timed up and go (TUG): 11.31 4 stage:SLS x 5s 3 min walk test:455ft around pool.  10/22/22:  5x STS: 11.95s  GAIT: Distance walked: 400 Assistive device utilized: None Level of assistance: SBA Comments: Slowed cadence, shorted step length, decreased hip flex with swing   TODAY'S TREATMENT:                                                                                                                              10/30/22 Quad set 10 x 10 second holds SLR 2 x 10 RLE Sidelying hip abduction 2 x 10 RLE LAQ 2 x 10 with 5 second holds RLE STS 2 x 10  Step up 4 inch 2 x 10 RLE Lateral step up 4 inch 1 x 10 RLE Standing hip abduction 1x10 bilateral  10/28/22 Pt seen for aquatic therapy today.  Treatment took place in water 3.5-4.75 ft in depth at the [facility] pool. Temp of water was 91.  Pt entered/exited the pool via stairs using step to pattern with hand rail.  *Without support: walking forward and backward , side stepping *slight side lunge with arm addct with rainbow hand floats x 4 widths * farmer carry with rainbow hand floats at side, marching forward/ backward  *quad and hip flex stretch using noodle ue support on wall.   Min assist needed to get into position. *Step ups bottom step leading R/L x10. No Ue support once exercise initiated *cycling on noodle x 6 widths *SLS R/L 3.6 ft yellow HB-> rainbow HB->unsupported.  Progressed to bottom step ue support on hand rails as needed *UE support on wall: curtsy squats x10 * UE on rainbow hand floats:  LE swings into hip flex/ ext x 10; relaxed squats, traditional squats   Pt requires the buoyancy and hydrostatic pressure of water for support, and to offload joints by unweighting joint load by at least 50 % in navel deep water and by at least 75-80% in chest to neck deep water.  Viscosity of the water is needed for resistance of strengthening. Water current perturbations provides challenge to standing balance requiring increased core activation.  PATIENT EDUCATION:  Education details: aquatic therapy exercise rationale, progressions/ modifications  Person educated: Patient  Education method: Explanation Education comprehension: verbalized understanding  HOME EXERCISE PROGRAM: Pt following land based HEP given to her from last session of therapy ([facility])  Access Code: [email or web address] URL: [email or web address] Date: 10/30/2022 - Supine Quadricep Sets  - 2 x daily - 7 x weekly - 10 reps - 10 second holds hold - Active Straight Leg Raise with Quad Set (Mirrored)  - 2 x daily - 7 x weekly - 2 sets - 10 reps - Seated Long Arc Quad  - 2 x daily - 7 x weekly -  10 reps - 5-10 second holds hold - Sit to Stand with Arms Crossed  - 2 x daily - 7 x weekly - 2 sets - 10 reps  ASSESSMENT:  CLINICAL IMPRESSION: Began session with table exercises which are performed with good mechanics after initial cueing and demonstration.  Patient able to complete functional strengthening without increase in symptoms. Patient will continue to benefit from physical therapy in order to improve function and reduce impairment.      Initial Impression Patient is a 62 y.o. f  who was seen today for physical therapy evaluation and treatment for right knee pain s/p TKR 2022. Has had a Left TKR March 2024.  Pt complains of left knee "cracking and popping" with some minimal pain associated. Crepitus palpated with left knee extension supports complaints. Her ROM bilat knees are good.  Strength in LE are symmetrical although weakness is noted throughout. She reports she is raising her 4yo grandson and needs to be able to get on floor with him as well as left objects she would not be lifting otherwise. She is battling metastatic liver cancer which may decrease her ability to tolerate therapy and slow her progression.  She states she does get winded quickly with walking up to 5 mins. She will benefit from skilled physical therapy intervention.  Will begin in aquatic setting to use the properties of water to progress strength and improve balance in low loaded and safe setting then will progress to land based for added strengthening, balance and gait for improvement in functional mobility and endurance.  OBJECTIVE IMPAIRMENTS: Abnormal gait, decreased balance, decreased endurance, difficulty walking, decreased strength, and pain.   ACTIVITY LIMITATIONS: lifting, bending, squatting, stairs, and locomotion level  PARTICIPATION LIMITATIONS: cleaning, shopping, community activity, and yard work  PERSONAL FACTORS: Fitness, Past/current experiences, and 1 comorbidity: Liver ca  are also affecting patient's functional outcome.   REHAB POTENTIAL: Good  CLINICAL DECISION MAKING: Evolving/moderate complexity  EVALUATION COMPLEXITY: Moderate   GOALS: Goals reviewed with patient? Yes  SHORT TERM GOALS: Target date: Sept 27, 2024 Pt will tolerate full aquatic sessions consistently without increase in pain and with improving function to demonstrate good toleration and effectiveness of intervention.  Baseline: Goal status:10/28/22 Met  2.  Pt will improve on 5 X STS test to <or= 10  to  demonstrate improving functional lower extremity strength, transitional movements, and balance Baseline: 13.80 at eval,  11.90 on 10/22/22 Goal status: In progress    3.  Pt will be able to complete SLS R/L x 20s on bottom step in pool to demonstrate improving balance Baseline: 5 s land Goal status: INITIAL  4. Pt will be instructed and indep with rising from floor.  Baseline: can do it but its hard  Goal: INITIAL    LONG TERM GOALS: Target date: Dec 01, 2022  Pt will improve on LEFS by at least 9 points to demonstrate statistically significant change in pt functional ability Baseline: 28/80 Goal status: INITIAL  2.  Walk through grocery store pushing cart to demonstrate improved toleration to amb Baseline: unable Goal status: In Progress 10/28/22  3.  3 MWT to improve to 636ft to demonstrate improved toleration to amb. Baseline: 400 Goal status: INITIAL  4.  PT will improve SLS up to or >15s Baseline: 5 Goal status: INITIAL  5.  5.  Pt will be indep with final HEP's (land and aquatic as appropriate) for continued management of condition Baseline:  Goal status: INITIAL  6.  Pt will  improve strength in all areas listed by at least 10lbs to demonstrate improved overall physical function Baseline: see chart Goal status: INITIAL   PLAN:  PT FREQUENCY: 1-2x/week  PT DURATION: 8 weeks 12 visit.  Extended weeks to allow for scheduling conflicts  PLANNED INTERVENTIONS: Therapeutic exercises, Therapeutic activity, Neuromuscular re-education, Balance training, Gait training, Patient/Family education, Self Care, Joint mobilization, Joint manipulation, Stair training, Orthotic/Fit training, DME instructions, Aquatic Therapy, Dry Needling, Electrical stimulation, Cryotherapy, Moist heat, scar mobilization, Taping, Ultrasound, Ionotophoresis 4mg /ml Dexamethasone, Manual therapy, and Re-evaluation  PLAN FOR NEXT SESSION: Aquatics: LE and core strengthening; balance retraining; gait  and endurance retraining Land: instruct on use of approp equipment (pt to join gym), le strengthening, floor transfers, body mechanics.  9:31 AM, 10/30/22 Wyman Songster PT, DPT Physical Therapist at [facility]

## 2022-10-31 ENCOUNTER — Encounter: Payer: Self-pay | Admitting: Physician Assistant

## 2022-11-03 ENCOUNTER — Other Ambulatory Visit: Payer: Self-pay | Admitting: Physician Assistant

## 2022-11-03 ENCOUNTER — Other Ambulatory Visit: Payer: Self-pay

## 2022-11-03 DIAGNOSIS — N644 Mastodynia: Secondary | ICD-10-CM

## 2022-11-03 NOTE — Telephone Encounter (Signed)
Please see patient message and advise if you would like to place order for Diagnostic Mammogram

## 2022-11-05 ENCOUNTER — Encounter (HOSPITAL_BASED_OUTPATIENT_CLINIC_OR_DEPARTMENT_OTHER): Payer: Self-pay | Admitting: Physical Therapy

## 2022-11-05 ENCOUNTER — Ambulatory Visit (HOSPITAL_BASED_OUTPATIENT_CLINIC_OR_DEPARTMENT_OTHER): Payer: BC Managed Care – PPO | Admitting: Physical Therapy

## 2022-11-05 DIAGNOSIS — R2689 Other abnormalities of gait and mobility: Secondary | ICD-10-CM

## 2022-11-05 DIAGNOSIS — G8929 Other chronic pain: Secondary | ICD-10-CM

## 2022-11-05 DIAGNOSIS — M25561 Pain in right knee: Secondary | ICD-10-CM | POA: Diagnosis not present

## 2022-11-05 DIAGNOSIS — M6281 Muscle weakness (generalized): Secondary | ICD-10-CM

## 2022-11-05 NOTE — Therapy (Signed)
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   Constipation    Diabetes (HCC)    Dyspnea    with exertion   Fatty liver    Gallbladder problem    GE reflux    Hepatic encephalopathy (HCC) 2011   History of PCR DNA positive for HSV1    History of retinal vein occlusion, right eye    HTN (hypertension) 2011   Joint pain    Liver problem    Neuroendocrine tumor 2011   Orthostatic hypotension    Osteoarthritis    Osteopenia    Palpitations    Sarcoidosis 2008   Sleep apnea    uses cpap   Surgical menopause    Past Surgical History:  Procedure Laterality Date   ABDOMINAL HYSTERECTOMY  2004   AUGMENTATION MAMMAPLASTY     BREAST BIOPSY Left    BREAST RECONSTRUCTION Bilateral 05/07/2020   Procedure: Revision of bilateral breast reconstruction;  Surgeon: Allena Napoleon, MD;  Location: [facility];  Service: Plastics;  Laterality: Bilateral;  2 hours   CESAREAN SECTION  [id number]   CHOLECYSTECTOMY  2006   HERNIA REPAIR     KNEE ARTHROSCOPY Left 09/2010   LAPAROSCOPIC HEPATECTOMY     LIPOSUCTION Bilateral 05/07/2020   Procedure: LIPOSUCTION with excision of lateral breast and axillary tissue;  Surgeon: Allena Napoleon, MD;  Location: [facility];  Service: Plastics;  Laterality: Bilateral;   LIVER SURGERY     MASTECTOMY Bilateral 2008   NASAL SINUS SURGERY      OOPHORECTOMY  2004   RECONSTRUCTION BREAST W/ LATISSIMUS DORSI FLAP Bilateral    TONSILLECTOMY  1976   TOTAL KNEE ARTHROPLASTY Left 10/29/2020   Procedure: TOTAL KNEE ARTHROPLASTY;  Surgeon: Ollen Gross, MD;  Location: [facility];  Service: Orthopedics;  Laterality: Left;   TOTAL KNEE ARTHROPLASTY Right 04/14/2022   Procedure: TOTAL KNEE ARTHROPLASTY;  Surgeon: Ollen Gross, MD;  Location: [facility];  Service: Orthopedics;  Laterality: Right;   TUBAL LIGATION Bilateral 2001   Patient Active Problem List   Diagnosis Date Noted   Primary osteoarthritis of right knee 04/14/2022   Primary osteoarthritis of left knee 10/29/2020   At risk for dehydration 07/04/2020   Depression 07/04/2020   Other fatigue 07/07/2019   Hepatic steatosis 01/03/2019   S/P right knee arthroscopy 10/19/2018   Hyperlipidemia associated with type 2 diabetes mellitus (HCC) 09/01/2018   Class 3 severe obesity with serious comorbidity and body mass index (BMI) of 45.0 to 49.9 in adult (HCC) 09/01/2018   OSA on CPAP 05/04/2018   Solitary pulmonary nodule 05/04/2018   History of prophylactic mastectomy of both breasts 11/04/2017   Preop examination 10/20/2017  Atrophic vaginitis 04/12/2016   Osteopenia 09/27/2015   Branch retinal vein occlusion of left eye 12/12/2014   Hypertensive retinopathy of both eyes 12/12/2014   Lung nodule seen on imaging study 05/01/2014   Shortness of breath on exertion    Type 2 diabetes mellitus with other specified complication (HCC) 03/17/2013   Morbid obesity (HCC) 03/01/2013   Carcinoid syndrome (HCC) 02/21/2013   HSV infection 09/14/2012   Chronic pain of both knees 03/17/2012   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) 10/22/2010   Hypertension associated with type 2 diabetes mellitus (HCC) 10/22/2010   GERD (gastroesophageal reflux disease) 10/22/2010   Allergic rhinitis 10/22/2010   Sarcoidosis 10/22/2010   OA (osteoarthritis) of knee 10/22/2010   Anxiety  10/22/2010    PCP: Bary Leriche, PA-C   REFERRING PROVIDER: Allwardt, Crist Infante, PA-C   REFERRING DIAG: Osteoarthritis of right knee, unspecified osteoarthritis type   THERAPY DIAG:  Chronic pain of right knee  Muscle weakness (generalized)  Other abnormalities of gait and mobility  Rationale for Evaluation and Treatment: Rehabilitation  ONSET DATE: 2022  SUBJECTIVE:   SUBJECTIVE STATEMENT: "I'm noticing the crunching (in knees) less because I think it's getting better".    Initial subjective: R TKR 2022, It now sounds like I am stepping on a bag of ruffles.  It is not as strong as it used to be.  Left knee 2024 March. Left knee is much better.   PERTINENT HISTORY: Pt PMH includes but is not limited to: HDL, DM II, OSA on CPAP, B mastectomy, carcinoid syndrome, metastatic neuroendocine ca, and L TKA (2022).  PAIN:  Are you having pain? No:  NPRS scale: current 0/10 Pain location:  Pain description:   Aggravating factors: walking distances no greater than 5 mins Relieving factors: elevating; sitting  PRECAUTIONS: Fall  RED FLAGS: None   WEIGHT BEARING RESTRICTIONS: No  FALLS:  Has patient fallen in last 6 months? No  LIVING ENVIRONMENT: Lives with: lives with their spouse Lives in: House/apartment Stairs: Yes: External: 3 steps; can reach both Has following equipment at home: Single point cane, Walker - 2 wheeled, Tour manager, and Grab bars   PLOF: Independent  PATIENT GOALS: ease the crackle, strengthen, care of 4 yo grandson/care for.  NEXT MD VISIT: oncologist  OBJECTIVE:    PATIENT SURVEYS:  LEFS:28/80  COGNITION: Overall cognitive status: Within functional limits for tasks assessed     SENSATION: WFL  EDEMA:  none  MUSCLE LENGTH: Hamstrings:full   POSTURE: forward head  PALPATION: Left knee crepitus.  LOWER EXTREMITY ROM:  Active ROM Right eval Left eval  Hip flexion wfl wfl  Hip extension    Hip abduction    Hip  adduction    Hip internal rotation    Hip external rotation    Knee flexion 125 125  Knee extension 0 0  Ankle dorsiflexion    Ankle plantarflexion    Ankle inversion    Ankle eversion     (Blank rows = not tested)  LOWER EXTREMITY MMT:  MMT Right eval Left eval  Hip flexion 49.1 50.9  Hip extension    Hip abduction 34.5 33.4  Hip adduction    Hip internal rotation    Hip external rotation    Knee flexion    Knee extension 30.7 34.2  Ankle dorsiflexion    Ankle plantarflexion    Ankle inversion    Ankle eversion     (Blank rows = not tested)   FUNCTIONAL TESTS:  5 times sit to stand: 13.80 Timed up and go (TUG): 11.31 4 stage:SLS x 5s 3 min walk test:453ft around pool.  10/22/22:  5x STS: 11.95s  GAIT: Distance walked: 400 Assistive device utilized: None Level of assistance: SBA Comments: Slowed cadence, shorted step length, decreased hip flex with swing   TODAY'S TREATMENT:                                                                                                                              11/05/22 Pt seen for aquatic therapy today.  Treatment took place in water 3.5-4.75 ft in depth at the [facility] pool. Temp of water was 91.  Pt entered/exited the pool via stairs using step to pattern with hand rail.  *Without support: walking forward and backward , side stepping - 3 laps of each * farmer carry with rainbow hand floats at side, marching forward/ backward  * SLS with hands out of water x 2 reps, up to 15-20 sec each LE; SLS on first step without UE support x 3 reps, 5-15 seconds * UE support on rainbow hand floats:  LE swings into hip flex/ext and hip abdc/ addct (crossing midline) 2 sets of 10 of each (2nd set smaller and quicker) *cycling without UE support on noodle x 6 widths * UE on noodle: curtsy lunges x 10; then opp knee /arm lift with noodle press x 5 each side *quad stretch with foot on 2nd step - x20s x 2 each  LE   10/30/22 Quad set 10 x 10 second holds SLR 2 x 10 RLE Sidelying hip abduction 2 x 10 RLE LAQ 2 x 10 with 5 second holds RLE STS 2 x 10  Step up 4 inch 2 x 10 RLE Lateral step up 4 inch 1 x 10 RLE Standing hip abduction 1x10 bilateral  10/28/22 Pt seen for aquatic therapy today.  Treatment took place in water 3.5-4.75 ft in depth at the [facility] pool. Temp of water was 91.  Pt entered/exited the pool via stairs using step to pattern with hand rail.  *Without support: walking forward and backward , side stepping *slight side lunge with arm addct with rainbow hand floats x 4 widths * farmer carry with rainbow hand floats at side, marching forward/ backward  *quad and hip flex stretch using noodle ue support on wall.  Min assist needed to get into position. *Step ups bottom step leading R/L x10. No Ue support once exercise initiated *cycling on noodle x 6 widths *SLS R/L 3.6 ft yellow HB-> rainbow HB->unsupported.  Progressed to bottom step ue support on hand rails as needed *UE support on wall: curtsy squats x10 * UE on rainbow hand floats:  LE swings into hip flex/ ext x 10; relaxed squats, traditional squats   Pt requires the buoyancy and hydrostatic pressure of water for support, and to offload joints by unweighting joint load by at least 50 % in navel deep  water and by at least 75-80% in chest to neck deep water.  Viscosity of the water is needed for resistance of strengthening. Water current perturbations provides challenge to standing balance requiring increased core activation.  PATIENT EDUCATION:  Education details: aquatic therapy exercise rationale, progressions/ modifications  Person educated: Patient  Education method: Explanation Education comprehension: verbalized understanding  HOME EXERCISE PROGRAM: Pt following land based HEP given to her from last session of therapy ([facility])  Access Code: [email or web address] URL:  [email or web address] Date: 10/30/2022 - Supine Quadricep Sets  - 2 x daily - 7 x weekly - 10 reps - 10 second holds hold - Active Straight Leg Raise with Quad Set (Mirrored)  - 2 x daily - 7 x weekly - 2 sets - 10 reps - Seated Long Arc Quad  - 2 x daily - 7 x weekly - 10 reps - 5-10 second holds hold - Sit to Stand with Arms Crossed  - 2 x daily - 7 x weekly - 2 sets - 10 reps  Aquatic Access Code: [email or web address] URL: [email or web address] Date: 11/05/2022 * not issued yet, still compiling exercises  ASSESSMENT:  CLINICAL IMPRESSION: Pt's SLS balance has improved, but continues to provide a challenge while in the water. She has met STG3.  No increase in knee pain during session. Will plan to work on LE strengthening with reciprocal stairs next aquatic visit (currently completes in step-to pattern ascending leading with RLE). Patient will continue to benefit from physical therapy in order to improve function and reduce impairment. Pt is progressing towards remaining goals.       Initial Impression Patient is a 62 y.o. f who was seen today for physical therapy evaluation and treatment for right knee pain s/p TKR 2022. Has had a Left TKR March 2024.  Pt complains of left knee "cracking and popping" with some minimal pain associated. Crepitus palpated with left knee extension supports complaints. Her ROM bilat knees are good.  Strength in LE are symmetrical although weakness is noted throughout. She reports she is raising her 4yo grandson and needs to be able to get on floor with him as well as left objects she would not be lifting otherwise. She is battling metastatic liver cancer which may decrease her ability to tolerate therapy and slow her progression.  She states she does get winded quickly with walking up to 5 mins. She will benefit from skilled physical therapy intervention.  Will begin in aquatic setting to use the properties of water to progress strength and  improve balance in low loaded and safe setting then will progress to land based for added strengthening, balance and gait for improvement in functional mobility and endurance.  OBJECTIVE IMPAIRMENTS: Abnormal gait, decreased balance, decreased endurance, difficulty walking, decreased strength, and pain.   ACTIVITY LIMITATIONS: lifting, bending, squatting, stairs, and locomotion level  PARTICIPATION LIMITATIONS: cleaning, shopping, community activity, and yard work  PERSONAL FACTORS: Fitness, Past/current experiences, and 1 comorbidity: Liver ca  are also affecting patient's functional outcome.   REHAB POTENTIAL: Good  CLINICAL DECISION MAKING: Evolving/moderate complexity  EVALUATION COMPLEXITY: Moderate   GOALS: Goals reviewed with patient? Yes  SHORT TERM GOALS: Target date: Sept 27, 2024 Pt will tolerate full aquatic sessions consistently without increase in pain and with improving function to demonstrate good toleration and effectiveness of intervention.  Baseline: Goal status:10/28/22 Met  2.  Pt will improve on 5 X STS test to <or= 10  to demonstrate improving functional lower extremity strength, transitional movements, and balance Baseline:  13.80 at eval,  11.90 on 10/22/22 Goal status: In progress    3.  Pt will be able to complete SLS R/L x 20s on bottom step in pool to demonstrate improving balance Baseline: 5 s land Goal status: Met- 11/05/22  4. Pt will be instructed and indep with rising from floor.  Baseline: can do it but its hard  Goal: INITIAL    LONG TERM GOALS: Target date: Dec 01, 2022  Pt will improve on LEFS by at least 9 points to demonstrate statistically significant change in pt functional ability Baseline: 28/80 Goal status: INITIAL  2.  Walk through grocery store pushing cart to demonstrate improved toleration to amb Baseline: unable Goal status: In Progress 10/28/22  3.  3 MWT to improve to 627ft to demonstrate improved toleration to  amb. Baseline: 400 Goal status: INITIAL  4.  PT will improve SLS up to or >15s Baseline: 5 Goal status: INITIAL  5.  5.  Pt will be indep with final HEP's (land and aquatic as appropriate) for continued management of condition Baseline:  Goal status: INITIAL  6.  Pt will improve strength in all areas listed by at least 10lbs to demonstrate improved overall physical function Baseline: see chart Goal status: INITIAL   PLAN:  PT FREQUENCY: 1-2x/week  PT DURATION: 8 weeks 12 visit.  Extended weeks to allow for scheduling conflicts  PLANNED INTERVENTIONS: Therapeutic exercises, Therapeutic activity, Neuromuscular re-education, Balance training, Gait training, Patient/Family education, Self Care, Joint mobilization, Joint manipulation, Stair training, Orthotic/Fit training, DME instructions, Aquatic Therapy, Dry Needling, Electrical stimulation, Cryotherapy, Moist heat, scar mobilization, Taping, Ultrasound, Ionotophoresis 4mg /ml Dexamethasone, Manual therapy, and Re-evaluation  PLAN FOR NEXT SESSION: Aquatics: LE and core strengthening; balance retraining; gait and endurance retraining Land: instruct on use of approp equipment (pt to join gym), le strengthening, floor transfers, body mechanics.   Mayer Camel, PTA 11/05/22 10:18 AM [facility] [address] [place], [place], [address] Phone: [phone]   Fax:  [phone]

## 2022-11-06 DIAGNOSIS — Z1231 Encounter for screening mammogram for malignant neoplasm of breast: Secondary | ICD-10-CM

## 2022-11-07 ENCOUNTER — Ambulatory Visit (HOSPITAL_BASED_OUTPATIENT_CLINIC_OR_DEPARTMENT_OTHER): Payer: BC Managed Care – PPO | Admitting: Physical Therapy

## 2022-11-07 ENCOUNTER — Encounter (HOSPITAL_BASED_OUTPATIENT_CLINIC_OR_DEPARTMENT_OTHER): Payer: Self-pay | Admitting: Physical Therapy

## 2022-11-07 DIAGNOSIS — R2689 Other abnormalities of gait and mobility: Secondary | ICD-10-CM | POA: Diagnosis not present

## 2022-11-07 DIAGNOSIS — M6281 Muscle weakness (generalized): Secondary | ICD-10-CM | POA: Diagnosis not present

## 2022-11-07 DIAGNOSIS — G8929 Other chronic pain: Secondary | ICD-10-CM | POA: Diagnosis not present

## 2022-11-07 DIAGNOSIS — M25561 Pain in right knee: Secondary | ICD-10-CM | POA: Diagnosis not present

## 2022-11-07 NOTE — Therapy (Signed)
OUTPATIENT PHYSICAL THERAPY LOWER EXTREMITY TREATMENT   Patient Name: Leah [name] MRN: [id number] DOB:10/22/1960, 62 y.o., female Today's Date: 11/07/2022  END OF SESSION:  PT End of Session - 11/07/22 1023     Visit Number 8    Number of Visits 12    Date for PT Re-Evaluation 12/01/22    Authorization Type BCBS    PT Start Time 1023    PT Stop Time 1103    PT Time Calculation (min) 40 min    Activity Tolerance Patient tolerated treatment well    Behavior During Therapy [facility] for tasks assessed/performed               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   Constipation    Diabetes (HCC)    Dyspnea    with exertion   Fatty liver    Gallbladder problem    GE reflux    Hepatic encephalopathy (HCC) 2011   History of PCR DNA positive for HSV1    History of retinal vein occlusion, right eye    HTN (hypertension) 2011   Joint pain    Liver problem    Neuroendocrine tumor 2011   Orthostatic hypotension    Osteoarthritis    Osteopenia    Palpitations    Sarcoidosis 2008   Sleep apnea    uses cpap   Surgical menopause    Past Surgical History:  Procedure Laterality Date   ABDOMINAL HYSTERECTOMY  2004   AUGMENTATION MAMMAPLASTY     BREAST BIOPSY Left    BREAST RECONSTRUCTION Bilateral 05/07/2020   Procedure: Revision of bilateral breast reconstruction;  Surgeon: Allena Napoleon, MD;  Location: [facility];  Service: Plastics;  Laterality: Bilateral;  2 hours   CESAREAN SECTION  [id number]   CHOLECYSTECTOMY  2006   HERNIA REPAIR     KNEE ARTHROSCOPY Left 09/2010   LAPAROSCOPIC HEPATECTOMY     LIPOSUCTION Bilateral 05/07/2020   Procedure: LIPOSUCTION with excision of lateral breast and axillary tissue;  Surgeon: Allena Napoleon, MD;  Location: [facility];   Service: Plastics;  Laterality: Bilateral;   LIVER SURGERY     MASTECTOMY Bilateral 2008   NASAL SINUS SURGERY     OOPHORECTOMY  2004   RECONSTRUCTION BREAST W/ LATISSIMUS DORSI FLAP Bilateral    TONSILLECTOMY  1976   TOTAL KNEE ARTHROPLASTY Left 10/29/2020   Procedure: TOTAL KNEE ARTHROPLASTY;  Surgeon: Ollen Gross, MD;  Location: [facility];  Service: Orthopedics;  Laterality: Left;   TOTAL KNEE ARTHROPLASTY Right 04/14/2022   Procedure: TOTAL KNEE ARTHROPLASTY;  Surgeon: Ollen Gross, MD;  Location: [facility];  Service: Orthopedics;  Laterality: Right;   TUBAL LIGATION Bilateral 2001   Patient Active Problem List   Diagnosis Date Noted   Primary osteoarthritis of right knee 04/14/2022   Primary osteoarthritis of left knee 10/29/2020   At risk for dehydration 07/04/2020   Depression 07/04/2020   Other fatigue 07/07/2019   Hepatic steatosis 01/03/2019   S/P right knee arthroscopy 10/19/2018   Hyperlipidemia associated with type 2 diabetes mellitus (HCC) 09/01/2018   Class 3 severe obesity with serious comorbidity and body mass index (BMI) of 45.0 to 49.9 in adult (HCC) 09/01/2018   OSA on CPAP 05/04/2018   Solitary  pulmonary nodule 05/04/2018   History of prophylactic mastectomy of both breasts 11/04/2017   Preop examination 10/20/2017   Atrophic vaginitis 04/12/2016   Osteopenia 09/27/2015   Branch retinal vein occlusion of left eye 12/12/2014   Hypertensive retinopathy of both eyes 12/12/2014   Lung nodule seen on imaging study 05/01/2014   Shortness of breath on exertion    Type 2 diabetes mellitus with other specified complication (HCC) 03/17/2013   Morbid obesity (HCC) 03/01/2013   Carcinoid syndrome (HCC) 02/21/2013   HSV infection 09/14/2012   Chronic pain of both knees 03/17/2012   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) 10/22/2010   Hypertension associated with type 2 diabetes mellitus (HCC) 10/22/2010   GERD (gastroesophageal reflux disease) 10/22/2010    Allergic rhinitis 10/22/2010   Sarcoidosis 10/22/2010   OA (osteoarthritis) of knee 10/22/2010   Anxiety 10/22/2010    PCP: Bary Leriche, PA-C   REFERRING PROVIDER: Allwardt, Crist Infante, PA-C   REFERRING DIAG: Osteoarthritis of right knee, unspecified osteoarthritis type   THERAPY DIAG:  Chronic pain of right knee  Muscle weakness (generalized)  Other abnormalities of gait and mobility  Rationale for Evaluation and Treatment: Rehabilitation  ONSET DATE: 2022  SUBJECTIVE:   SUBJECTIVE STATEMENT: Knees have been crunching. Legs have been feeling better.    Initial subjective: R TKR 2022, It now sounds like I am stepping on a bag of ruffles.  It is not as strong as it used to be.  Left knee 2024 March. Left knee is much better.   PERTINENT HISTORY: Pt PMH includes but is not limited to: HDL, DM II, OSA on CPAP, B mastectomy, carcinoid syndrome, metastatic neuroendocine ca, and L TKA (2022).  PAIN:  Are you having pain? No:  NPRS scale: current 0/10 Pain location:  Pain description:   Aggravating factors: walking distances no greater than 5 mins Relieving factors: elevating; sitting  PRECAUTIONS: Fall  RED FLAGS: None   WEIGHT BEARING RESTRICTIONS: No  FALLS:  Has patient fallen in last 6 months? No  LIVING ENVIRONMENT: Lives with: lives with their spouse Lives in: House/apartment Stairs: Yes: External: 3 steps; can reach both Has following equipment at home: Single point cane, Walker - 2 wheeled, Tour manager, and Grab bars   PLOF: Independent  PATIENT GOALS: ease the crackle, strengthen, care of 4 yo grandson/care for.  NEXT MD VISIT: oncologist  OBJECTIVE:    PATIENT SURVEYS:  LEFS:28/80  COGNITION: Overall cognitive status: Within functional limits for tasks assessed     SENSATION: WFL  EDEMA:  none  MUSCLE LENGTH: Hamstrings:full   POSTURE: forward head  PALPATION: Left knee crepitus.  LOWER EXTREMITY ROM:  Active ROM  Right eval Left eval  Hip flexion wfl wfl  Hip extension    Hip abduction    Hip adduction    Hip internal rotation    Hip external rotation    Knee flexion 125 125  Knee extension 0 0  Ankle dorsiflexion    Ankle plantarflexion    Ankle inversion    Ankle eversion     (Blank rows = not tested)  LOWER EXTREMITY MMT:  MMT Right eval Left eval  Hip flexion 49.1 50.9  Hip extension    Hip abduction 34.5 33.4  Hip adduction    Hip internal rotation    Hip external rotation    Knee flexion    Knee extension 30.7 34.2  Ankle dorsiflexion    Ankle plantarflexion    Ankle inversion  Ankle eversion     (Blank rows = not tested)   FUNCTIONAL TESTS:  5 times sit to stand: 13.80 Timed up and go (TUG): 11.31 4 stage:SLS x 5s 3 min walk test:471ft around pool.  10/22/22:  5x STS: 11.95s  GAIT: Distance walked: 400 Assistive device utilized: None Level of assistance: SBA Comments: Slowed cadence, shorted step length, decreased hip flex with swing   TODAY'S TREATMENT:                                                                                                                              11/07/22 Nustep level 5 seat 3, 5 minutes for dynamic warm up Step up 4 inch 2 x 10 BLE Lateral step up 4 inch 2 x 10 BLE Lunge 2 x 10 bilateral  Tandem stance 3x 30 second holds bilateral Squat with UE support 2 x10   11/05/22 Pt seen for aquatic therapy today.  Treatment took place in water 3.5-4.75 ft in depth at the [facility] pool. Temp of water was 91.  Pt entered/exited the pool via stairs using step to pattern with hand rail.  *Without support: walking forward and backward , side stepping - 3 laps of each * farmer carry with rainbow hand floats at side, marching forward/ backward  * SLS with hands out of water x 2 reps, up to 15-20 sec each LE; SLS on first step without UE support x 3 reps, 5-15 seconds * UE support on rainbow hand floats:  LE swings into hip  flex/ext and hip abdc/ addct (crossing midline) 2 sets of 10 of each (2nd set smaller and quicker) *cycling without UE support on noodle x 6 widths * UE on noodle: curtsy lunges x 10; then opp knee /arm lift with noodle press x 5 each side *quad stretch with foot on 2nd step - x20s x 2 each LE   10/30/22 Quad set 10 x 10 second holds SLR 2 x 10 RLE Sidelying hip abduction 2 x 10 RLE LAQ 2 x 10 with 5 second holds RLE STS 2 x 10  Step up 4 inch 2 x 10 RLE Lateral step up 4 inch 1 x 10 RLE Standing hip abduction 1x10 bilateral  10/28/22 Pt seen for aquatic therapy today.  Treatment took place in water 3.5-4.75 ft in depth at the [facility] pool. Temp of water was 91.  Pt entered/exited the pool via stairs using step to pattern with hand rail.  *Without support: walking forward and backward , side stepping *slight side lunge with arm addct with rainbow hand floats x 4 widths * farmer carry with rainbow hand floats at side, marching forward/ backward  *quad and hip flex stretch using noodle ue support on wall.  Min assist needed to get into position. *Step ups bottom step leading R/L x10. No Ue support once exercise initiated *cycling on noodle x 6 widths *SLS R/L 3.6 ft yellow HB-> rainbow HB->unsupported.  Progressed to bottom step ue support on hand rails as needed *UE support on wall: curtsy squats x10 * UE on rainbow hand floats:  LE swings into hip flex/ ext x 10; relaxed squats, traditional squats   Pt requires the buoyancy and hydrostatic pressure of water for support, and to offload joints by unweighting joint load by at least 50 % in navel deep water and by at least 75-80% in chest to neck deep water.  Viscosity of the water is needed for resistance of strengthening. Water current perturbations provides challenge to standing balance requiring increased core activation.  PATIENT EDUCATION:  Education details: aquatic therapy exercise rationale, progressions/  modifications  Person educated: Patient  Education method: Explanation Education comprehension: verbalized understanding  HOME EXERCISE PROGRAM: Pt following land based HEP given to her from last session of therapy ([facility])  Access Code: [email or web address] URL: [email or web address] Date: 10/30/2022 - Supine Quadricep Sets  - 2 x daily - 7 x weekly - 10 reps - 10 second holds hold - Active Straight Leg Raise with Quad Set (Mirrored)  - 2 x daily - 7 x weekly - 2 sets - 10 reps - Seated Long Arc Quad  - 2 x daily - 7 x weekly - 10 reps - 5-10 second holds hold - Sit to Stand with Arms Crossed  - 2 x daily - 7 x weekly - 2 sets - 10 reps  Aquatic Access Code: [email or web address] URL: [email or web address] Date: 11/05/2022 * not issued yet, still compiling exercises  ASSESSMENT:  CLINICAL IMPRESSION: Began session with nustep for dynamic warm up and conditioning. Continued with functional strengthening and began lunges for improving quad strength for floor/standing transfers. Patient requiring frequent HHA with attempted SLS, improved ability and reduces UE support with tandem stance. Patient will continue to benefit from physical therapy in order to improve function and reduce impairment.       Initial Impression Patient is a 62 y.o. f who was seen today for physical therapy evaluation and treatment for right knee pain s/p TKR 2022. Has had a Left TKR March 2024.  Pt complains of left knee "cracking and popping" with some minimal pain associated. Crepitus palpated with left knee extension supports complaints. Her ROM bilat knees are good.  Strength in LE are symmetrical although weakness is noted throughout. She reports she is raising her 4yo grandson and needs to be able to get on floor with him as well as left objects she would not be lifting otherwise. She is battling metastatic liver cancer which may decrease her ability to tolerate therapy and slow her progression.   She states she does get winded quickly with walking up to 5 mins. She will benefit from skilled physical therapy intervention.  Will begin in aquatic setting to use the properties of water to progress strength and improve balance in low loaded and safe setting then will progress to land based for added strengthening, balance and gait for improvement in functional mobility and endurance.  OBJECTIVE IMPAIRMENTS: Abnormal gait, decreased balance, decreased endurance, difficulty walking, decreased strength, and pain.   ACTIVITY LIMITATIONS: lifting, bending, squatting, stairs, and locomotion level  PARTICIPATION LIMITATIONS: cleaning, shopping, community activity, and yard work  PERSONAL FACTORS: Fitness, Past/current experiences, and 1 comorbidity: Liver ca  are also affecting patient's functional outcome.   REHAB POTENTIAL: Good  CLINICAL DECISION MAKING: Evolving/moderate complexity  EVALUATION COMPLEXITY: Moderate   GOALS: Goals reviewed with patient? Yes  SHORT TERM GOALS: Target date: Sept 27, 2024 Pt will tolerate full  aquatic sessions consistently without increase in pain and with improving function to demonstrate good toleration and effectiveness of intervention.  Baseline: Goal status:10/28/22 Met  2.  Pt will improve on 5 X STS test to <or= 10  to demonstrate improving functional lower extremity strength, transitional movements, and balance Baseline: 13.80 at eval,  11.90 on 10/22/22 Goal status: In progress    3.  Pt will be able to complete SLS R/L x 20s on bottom step in pool to demonstrate improving balance Baseline: 5 s land Goal status: Met- 11/05/22  4. Pt will be instructed and indep with rising from floor.  Baseline: can do it but its hard  Goal: INITIAL    LONG TERM GOALS: Target date: Dec 01, 2022  Pt will improve on LEFS by at least 9 points to demonstrate statistically significant change in pt functional ability Baseline: 28/80 Goal status: INITIAL  2.   Walk through grocery store pushing cart to demonstrate improved toleration to amb Baseline: unable Goal status: In Progress 10/28/22  3.  3 MWT to improve to 656ft to demonstrate improved toleration to amb. Baseline: 400 Goal status: INITIAL  4.  PT will improve SLS up to or >15s Baseline: 5 Goal status: INITIAL  5.  5.  Pt will be indep with final HEP's (land and aquatic as appropriate) for continued management of condition Baseline:  Goal status: INITIAL  6.  Pt will improve strength in all areas listed by at least 10lbs to demonstrate improved overall physical function Baseline: see chart Goal status: INITIAL   PLAN:  PT FREQUENCY: 1-2x/week  PT DURATION: 8 weeks 12 visit.  Extended weeks to allow for scheduling conflicts  PLANNED INTERVENTIONS: Therapeutic exercises, Therapeutic activity, Neuromuscular re-education, Balance training, Gait training, Patient/Family education, Self Care, Joint mobilization, Joint manipulation, Stair training, Orthotic/Fit training, DME instructions, Aquatic Therapy, Dry Needling, Electrical stimulation, Cryotherapy, Moist heat, scar mobilization, Taping, Ultrasound, Ionotophoresis 4mg /ml Dexamethasone, Manual therapy, and Re-evaluation  PLAN FOR NEXT SESSION: Aquatics: LE and core strengthening; balance retraining; gait and endurance retraining Land: instruct on use of approp equipment (pt to join gym), le strengthening, floor transfers, body mechanics.   10:23 AM, 11/07/22 Wyman Songster PT, DPT Physical Therapist at [facility] [address] [place], [place], [address] Phone: [phone]   Fax:  [phone]

## 2022-11-11 ENCOUNTER — Ambulatory Visit (HOSPITAL_BASED_OUTPATIENT_CLINIC_OR_DEPARTMENT_OTHER): Payer: BC Managed Care – PPO | Attending: Physician Assistant | Admitting: Physical Therapy

## 2022-11-11 ENCOUNTER — Encounter (HOSPITAL_BASED_OUTPATIENT_CLINIC_OR_DEPARTMENT_OTHER): Payer: Self-pay | Admitting: Physical Therapy

## 2022-11-11 ENCOUNTER — Other Ambulatory Visit: Payer: Self-pay | Admitting: Physician Assistant

## 2022-11-11 DIAGNOSIS — R2689 Other abnormalities of gait and mobility: Secondary | ICD-10-CM | POA: Insufficient documentation

## 2022-11-11 DIAGNOSIS — G8929 Other chronic pain: Secondary | ICD-10-CM | POA: Diagnosis not present

## 2022-11-11 DIAGNOSIS — M25561 Pain in right knee: Secondary | ICD-10-CM | POA: Insufficient documentation

## 2022-11-11 DIAGNOSIS — M6281 Muscle weakness (generalized): Secondary | ICD-10-CM | POA: Diagnosis not present

## 2022-11-11 NOTE — Therapy (Signed)
OUTPATIENT PHYSICAL THERAPY LOWER EXTREMITY TREATMENT   Patient Name: Leah [name] MRN: [id number] DOB:Feb 25, 1960, 62 y.o., female Today's Date: 11/11/2022  END OF SESSION:  PT End of Session - 11/11/22 1002     Visit Number 9    Number of Visits 12    Date for PT Re-Evaluation 12/01/22    Authorization Type BC[employer]    PT Start Time 1000   arrives late   PT Stop Time 1030    PT Time Calculation (min) 30 min    Activity Tolerance Patient tolerated treatment well    Behavior During Therapy [facility] for tasks assessed/performed               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   Constipation    Diabetes (HCC)    Dyspnea    with exertion   Fatty liver    Gallbladder problem    GE reflux    Hepatic encephalopathy (HCC) 2011   History of PCR DNA positive for HSV1    History of retinal vein occlusion, right eye    HTN (hypertension) 2011   Joint pain    Liver problem    Neuroendocrine tumor 2011   Orthostatic hypotension    Osteoarthritis    Osteopenia    Palpitations    Sarcoidosis 2008   Sleep apnea    uses cpap   Surgical menopause    Past Surgical History:  Procedure Laterality Date   ABDOMINAL HYSTERECTOMY  2004   AUGMENTATION MAMMAPLASTY     BREAST BIOPSY Left    BREAST RECONSTRUCTION Bilateral 05/07/2020   Procedure: Revision of bilateral breast reconstruction;  Surgeon: Allena Napoleon, MD;  Location: [facility];  Service: Plastics;  Laterality: Bilateral;  2 hours   CESAREAN SECTION  [id number]   CHOLECYSTECTOMY  2006   HERNIA REPAIR     KNEE ARTHROSCOPY Left 09/2010   LAPAROSCOPIC HEPATECTOMY     LIPOSUCTION Bilateral 05/07/2020   Procedure: LIPOSUCTION with excision of lateral breast and axillary tissue;  Surgeon: Allena Napoleon, MD;  Location:  SURGERY  CENTER;  Service: Plastics;  Laterality: Bilateral;   LIVER SURGERY     MASTECTOMY Bilateral 2008   NASAL SINUS SURGERY     OOPHORECTOMY  2004   RECONSTRUCTION BREAST W/ LATISSIMUS DORSI FLAP Bilateral    TONSILLECTOMY  1976   TOTAL KNEE ARTHROPLASTY Left 10/29/2020   Procedure: TOTAL KNEE ARTHROPLASTY;  Surgeon: Ollen Gross, MD;  Location: [facility];  Service: Orthopedics;  Laterality: Left;   TOTAL KNEE ARTHROPLASTY Right 04/14/2022   Procedure: TOTAL KNEE ARTHROPLASTY;  Surgeon: Ollen Gross, MD;  Location: [facility];  Service: Orthopedics;  Laterality: Right;   TUBAL LIGATION Bilateral 2001   Patient Active Problem List   Diagnosis Date Noted   Primary osteoarthritis of right knee 04/14/2022   Primary osteoarthritis of left knee 10/29/2020   At risk for dehydration 07/04/2020   Depression 07/04/2020   Other fatigue 07/07/2019   Hepatic steatosis 01/03/2019   S/P right knee arthroscopy 10/19/2018   Hyperlipidemia associated with type 2 diabetes mellitus (HCC) 09/01/2018   Class 3 severe obesity with serious comorbidity and body mass index (BMI) of 45.0 to 49.9 in adult (HCC) 09/01/2018   OSA on CPAP 05/04/2018  Solitary pulmonary nodule 05/04/2018   History of prophylactic mastectomy of both breasts 11/04/2017   Preop examination 10/20/2017   Atrophic vaginitis 04/12/2016   Osteopenia 09/27/2015   Branch retinal vein occlusion of left eye 12/12/2014   Hypertensive retinopathy of both eyes 12/12/2014   Lung nodule seen on imaging study 05/01/2014   Shortness of breath on exertion    Type 2 diabetes mellitus with other specified complication (HCC) 03/17/2013   Morbid obesity (HCC) 03/01/2013   Carcinoid syndrome 02/21/2013   HSV infection 09/14/2012   Chronic pain of both knees 03/17/2012   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) 10/22/2010   Hypertension associated with type 2 diabetes mellitus (HCC) 10/22/2010   GERD (gastroesophageal reflux disease)  10/22/2010   Allergic rhinitis 10/22/2010   Sarcoidosis 10/22/2010   OA (osteoarthritis) of knee 10/22/2010   Anxiety 10/22/2010    PCP: Bary Leriche, PA-C   REFERRING PROVIDER: Allwardt, Crist Infante, PA-C   REFERRING DIAG: Osteoarthritis of right knee, unspecified osteoarthritis type   THERAPY DIAG:  Chronic pain of right knee  Muscle weakness (generalized)  Other abnormalities of gait and mobility  Rationale for Evaluation and Treatment: Rehabilitation  ONSET DATE: 2022  SUBJECTIVE:   SUBJECTIVE STATEMENT: Pain is down   Initial subjective: R TKR 2022, It now sounds like I am stepping on a bag of ruffles.  It is not as strong as it used to be.  Left knee 2024 March. Left knee is much better.   PERTINENT HISTORY: Pt PMH includes but is not limited to: HDL, DM II, OSA on CPAP, B mastectomy, carcinoid syndrome, metastatic neuroendocine ca, and L TKA (2022).  PAIN:  Are you having pain? No:  NPRS scale: current 0/10 Pain location:  Pain description:   Aggravating factors: walking distances no greater than 5 mins Relieving factors: elevating; sitting  PRECAUTIONS: Fall  RED FLAGS: None   WEIGHT BEARING RESTRICTIONS: No  FALLS:  Has patient fallen in last 6 months? No  LIVING ENVIRONMENT: Lives with: lives with their spouse Lives in: House/apartment Stairs: Yes: External: 3 steps; can reach both Has following equipment at home: Single point cane, Walker - 2 wheeled, Tour manager, and Grab bars   PLOF: Independent  PATIENT GOALS: ease the crackle, strengthen, care of 4 yo grandson/care for.  NEXT MD VISIT: oncologist  OBJECTIVE:    PATIENT SURVEYS:  LEFS:28/80  COGNITION: Overall cognitive status: Within functional limits for tasks assessed     SENSATION: WFL  EDEMA:  none  MUSCLE LENGTH: Hamstrings:full   POSTURE: forward head  PALPATION: Left knee crepitus.  LOWER EXTREMITY ROM:  Active ROM Right eval Left eval  Hip flexion  wfl wfl  Hip extension    Hip abduction    Hip adduction    Hip internal rotation    Hip external rotation    Knee flexion 125 125  Knee extension 0 0  Ankle dorsiflexion    Ankle plantarflexion    Ankle inversion    Ankle eversion     (Blank rows = not tested)  LOWER EXTREMITY MMT:  MMT Right eval Left eval  Hip flexion 49.1 50.9  Hip extension    Hip abduction 34.5 33.4  Hip adduction    Hip internal rotation    Hip external rotation    Knee flexion    Knee extension 30.7 34.2  Ankle dorsiflexion    Ankle plantarflexion    Ankle inversion    Ankle eversion     (  Blank rows = not tested)   FUNCTIONAL TESTS:  5 times sit to stand: 13.80 Timed up and go (TUG): 11.31 4 stage:SLS x 5s 3 min walk test:424ft around pool.  10/22/22:  5x STS: 11.95s  GAIT: Distance walked: 400 Assistive device utilized: None Level of assistance: SBA Comments: Slowed cadence, shorted step length, decreased hip flex with swing   TODAY'S TREATMENT:                                                                                                                              11/11/22 Pt seen for aquatic therapy today.  Treatment took place in water 3.5-4.75 ft in depth at the [facility] pool. Temp of water was 91.  Pt entered/exited the pool via stairs using step to pattern with hand rail.  *Without support: walking forward and backward , side stepping - 3 laps of each * unilateral farmer carry with yellow HB  at side, marching forward/ backward  *side lunge with UE add/abd yellow HB *forward lunges x 10 *backward lunges x 10 *squats bottom step x8; 2nd to bottom x 7; 3rd from bottom x5 *STS from 4th step x8;  *add set using orange BB x 8 5 s hold; STS with add set x 5 4th step. *tandem stance 75% submerged indep *SLS 75% then 50% submerged. After several tries pt successful holding x 10s  11/07/22 Nustep level 5 seat 3, 5 minutes for dynamic warm up Step up 4 inch 2 x 10  BLE Lateral step up 4 inch 2 x 10 BLE Lunge 2 x 10 bilateral  Tandem stance 3x 30 second holds bilateral Squat with UE support 2 x10   11/05/22 Pt seen for aquatic therapy today.  Treatment took place in water 3.5-4.75 ft in depth at the [facility] pool. Temp of water was 91.  Pt entered/exited the pool via stairs using step to pattern with hand rail.  *Without support: walking forward and backward , side stepping - 3 laps of each * farmer carry with rainbow hand floats at side, marching forward/ backward  * SLS with hands out of water x 2 reps, up to 15-20 sec each LE; SLS on first step without UE support x 3 reps, 5-15 seconds * UE support on rainbow hand floats:  LE swings into hip flex/ext and hip abdc/ addct (crossing midline) 2 sets of 10 of each (2nd set smaller and quicker) *cycling without UE support on noodle x 6 widths * UE on noodle: curtsy lunges x 10; then opp knee /arm lift with noodle press x 5 each side *quad stretch with foot on 2nd step - x20s x 2 each LE   10/30/22 Quad set 10 x 10 second holds SLR 2 x 10 RLE Sidelying hip abduction 2 x 10 RLE LAQ 2 x 10 with 5 second holds RLE STS 2 x 10  Step up 4 inch 2 x 10 RLE Lateral step up 4  inch 1 x 10 RLE Standing hip abduction 1x10 bilateral  10/28/22 Pt seen for aquatic therapy today.  Treatment took place in water 3.5-4.75 ft in depth at the [facility] pool. Temp of water was 91.  Pt entered/exited the pool via stairs using step to pattern with hand rail.  *Without support: walking forward and backward , side stepping *slight side lunge with arm addct with rainbow hand floats x 4 widths * farmer carry with rainbow hand floats at side, marching forward/ backward  *quad and hip flex stretch using noodle ue support on wall.  Min assist needed to get into position. *Step ups bottom step leading R/L x10. No Ue support once exercise initiated *cycling on noodle x 6 widths *SLS R/L 3.6 ft yellow HB->  rainbow HB->unsupported.  Progressed to bottom step ue support on hand rails as needed *UE support on wall: curtsy squats x10 * UE on rainbow hand floats:  LE swings into hip flex/ ext x 10; relaxed squats, traditional squats   Pt requires the buoyancy and hydrostatic pressure of water for support, and to offload joints by unweighting joint load by at least 50 % in navel deep water and by at least 75-80% in chest to neck deep water.  Viscosity of the water is needed for resistance of strengthening. Water current perturbations provides challenge to standing balance requiring increased core activation.  PATIENT EDUCATION:  Education details: aquatic therapy exercise rationale, progressions/ modifications  Person educated: Patient  Education method: Explanation Education comprehension: verbalized understanding  HOME EXERCISE PROGRAM: Pt following land based HEP given to her from last session of therapy ([facility])  Access Code: [email or web address] URL: [email or web address] Date: 10/30/2022 - Supine Quadricep Sets  - 2 x daily - 7 x weekly - 10 reps - 10 second holds hold - Active Straight Leg Raise with Quad Set (Mirrored)  - 2 x daily - 7 x weekly - 2 sets - 10 reps - Seated Long Arc Quad  - 2 x daily - 7 x weekly - 10 reps - 5-10 second holds hold - Sit to Stand with Arms Crossed  - 2 x daily - 7 x weekly - 2 sets - 10 reps  Aquatic Access Code: [email or web address] URL: [email or web address] Date: 11/05/2022 * not issued yet, still compiling exercises  ASSESSMENT:  CLINICAL IMPRESSION: Pain low. Progressed balance training which is tolerated well.  Decreased depth for increasing load. Pt able to hold position x 10s at 50% submerged. Goals ongoing        Initial Impression Patient is a 62 y.o. f who was seen today for physical therapy evaluation and treatment for right knee pain s/p TKR 2022. Has had a Left TKR March 2024.  Pt complains of left knee "cracking and  popping" with some minimal pain associated. Crepitus palpated with left knee extension supports complaints. Her ROM bilat knees are good.  Strength in LE are symmetrical although weakness is noted throughout. She reports she is raising her 4yo grandson and needs to be able to get on floor with him as well as left objects she would not be lifting otherwise. She is battling metastatic liver cancer which may decrease her ability to tolerate therapy and slow her progression.  She states she does get winded quickly with walking up to 5 mins. She will benefit from skilled physical therapy intervention.  Will begin in aquatic setting to use the properties of water to progress strength and improve balance in low loaded and safe setting then will  progress to land based for added strengthening, balance and gait for improvement in functional mobility and endurance.  OBJECTIVE IMPAIRMENTS: Abnormal gait, decreased balance, decreased endurance, difficulty walking, decreased strength, and pain.   ACTIVITY LIMITATIONS: lifting, bending, squatting, stairs, and locomotion level  PARTICIPATION LIMITATIONS: cleaning, shopping, community activity, and yard work  PERSONAL FACTORS: Fitness, Past/current experiences, and 1 comorbidity: Liver ca  are also affecting patient's functional outcome.   REHAB POTENTIAL: Good  CLINICAL DECISION MAKING: Evolving/moderate complexity  EVALUATION COMPLEXITY: Moderate   GOALS: Goals reviewed with patient? Yes  SHORT TERM GOALS: Target date: Sept 27, 2024 Pt will tolerate full aquatic sessions consistently without increase in pain and with improving function to demonstrate good toleration and effectiveness of intervention.  Baseline: Goal status:10/28/22 Met  2.  Pt will improve on 5 X STS test to <or= 10  to demonstrate improving functional lower extremity strength, transitional movements, and balance Baseline: 13.80 at eval,  11.90 on 10/22/22 Goal status: In progress    3.   Pt will be able to complete SLS R/L x 20s on bottom step in pool to demonstrate improving balance Baseline: 5 s land Goal status: Met- 11/05/22  4. Pt will be instructed and indep with rising from floor.  Baseline: can do it but its hard  Goal: INITIAL    LONG TERM GOALS: Target date: Dec 01, 2022  Pt will improve on LEFS by at least 9 points to demonstrate statistically significant change in pt functional ability Baseline: 28/80 Goal status: INITIAL  2.  Walk through grocery store pushing cart to demonstrate improved toleration to amb Baseline: unable Goal status: In Progress 10/28/22  3.  3 MWT to improve to 663ft to demonstrate improved toleration to amb. Baseline: 400 Goal status: INITIAL  4.  PT will improve SLS up to or >15s Baseline: 5 Goal status: INITIAL  5.  5.  Pt will be indep with final HEP's (land and aquatic as appropriate) for continued management of condition Baseline:  Goal status: INITIAL  6.  Pt will improve strength in all areas listed by at least 10lbs to demonstrate improved overall physical function Baseline: see chart Goal status: INITIAL   PLAN:  PT FREQUENCY: 1-2x/week  PT DURATION: 8 weeks 12 visit.  Extended weeks to allow for scheduling conflicts  PLANNED INTERVENTIONS: Therapeutic exercises, Therapeutic activity, Neuromuscular re-education, Balance training, Gait training, Patient/Family education, Self Care, Joint mobilization, Joint manipulation, Stair training, Orthotic/Fit training, DME instructions, Aquatic Therapy, Dry Needling, Electrical stimulation, Cryotherapy, Moist heat, scar mobilization, Taping, Ultrasound, Ionotophoresis 4mg /ml Dexamethasone, Manual therapy, and Re-evaluation  PLAN FOR NEXT SESSION: Aquatics: LE and core strengthening; balance retraining; gait and endurance retraining Land: instruct on use of approp equipment (pt to join gym), le strengthening, floor transfers, body mechanics.   10:02 AM, 11/11/22 Rushie Chestnut) Lilian Coma MPT Physical Therapist at [facility] [address] [place], [place], [address] Phone: [phone]   Fax:  [phone]

## 2022-11-11 NOTE — Telephone Encounter (Signed)
Last OV: 10/21/22 VV  Next OV: None  Last filled: 08/22/22  Quantity: 18 w/ 3 refills

## 2022-11-13 ENCOUNTER — Encounter (HOSPITAL_BASED_OUTPATIENT_CLINIC_OR_DEPARTMENT_OTHER): Payer: Self-pay | Admitting: Physical Therapy

## 2022-11-13 ENCOUNTER — Ambulatory Visit (HOSPITAL_BASED_OUTPATIENT_CLINIC_OR_DEPARTMENT_OTHER): Payer: BC Managed Care – PPO | Admitting: Physical Therapy

## 2022-11-13 DIAGNOSIS — R2689 Other abnormalities of gait and mobility: Secondary | ICD-10-CM | POA: Diagnosis not present

## 2022-11-13 DIAGNOSIS — G8929 Other chronic pain: Secondary | ICD-10-CM | POA: Diagnosis not present

## 2022-11-13 DIAGNOSIS — M25561 Pain in right knee: Secondary | ICD-10-CM | POA: Diagnosis not present

## 2022-11-13 DIAGNOSIS — M6281 Muscle weakness (generalized): Secondary | ICD-10-CM

## 2022-11-13 NOTE — Therapy (Signed)
OUTPATIENT PHYSICAL THERAPY LOWER EXTREMITY TREATMENT   Patient Name: Leah [name] MRN: [id number] DOB:[birth year], 62 y.o., female Today's Date: 11/13/2022  END OF SESSION:  PT End of Session - 11/13/22 1220     Visit Number 10    Number of Visits 12    Date for PT Re-Evaluation 12/01/22    Authorization Type [employer]    PT Start Time 1026   Patient 11 min late   PT Stop Time 1100    PT Time Calculation (min) 34 min    Activity Tolerance Patient tolerated treatment well    Behavior During Therapy WFL for tasks assessed/performed                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   Constipation    Diabetes (HCC)    Dyspnea    with exertion   Fatty liver    Gallbladder problem    GE reflux    Hepatic encephalopathy (HCC) 2011   History of PCR DNA positive for HSV1    History of retinal vein occlusion, right eye    HTN (hypertension) 2011   Joint pain    Liver problem    Neuroendocrine tumor 2011   Orthostatic hypotension    Osteoarthritis    Osteopenia    Palpitations    Sarcoidosis 2008   Sleep apnea    uses cpap   Surgical menopause    Past Surgical History:  Procedure Laterality Date   ABDOMINAL HYSTERECTOMY  2004   AUGMENTATION MAMMAPLASTY     BREAST BIOPSY Left    BREAST RECONSTRUCTION Bilateral 05/07/2020   Procedure: Revision of bilateral breast reconstruction;  Surgeon: Allena Napoleon, MD;  Location: [facility];  Service: Plastics;  Laterality: Bilateral;  2 hours   CESAREAN SECTION  4098,[id number]   CHOLECYSTECTOMY  2006   HERNIA REPAIR     KNEE ARTHROSCOPY Left 09/2010   LAPAROSCOPIC HEPATECTOMY     LIPOSUCTION Bilateral 05/07/2020   Procedure: LIPOSUCTION with excision of lateral breast and axillary tissue;  Surgeon: Allena Napoleon, MD;  Location: [facility];  Service: Plastics;  Laterality: Bilateral;   LIVER SURGERY     MASTECTOMY Bilateral 2008   NASAL SINUS SURGERY     OOPHORECTOMY  2004   RECONSTRUCTION BREAST W/ LATISSIMUS DORSI FLAP Bilateral    TONSILLECTOMY  1976   TOTAL KNEE ARTHROPLASTY Left 10/29/2020   Procedure: TOTAL KNEE ARTHROPLASTY;  Surgeon: Ollen Gross, MD;  Location: [facility];  Service: Orthopedics;  Laterality: Left;   TOTAL KNEE ARTHROPLASTY Right 04/14/2022   Procedure: TOTAL KNEE ARTHROPLASTY;  Surgeon: Ollen Gross, MD;  Location: [facility];  Service: Orthopedics;  Laterality: Right;   TUBAL LIGATION Bilateral 2001   Patient Active Problem List   Diagnosis Date Noted   Primary osteoarthritis of right knee 04/14/2022   Primary osteoarthritis of left knee 10/29/2020   At risk for dehydration 07/04/2020   Depression 07/04/2020   Other fatigue 07/07/2019   Hepatic steatosis 01/03/2019   S/P right knee arthroscopy 10/19/2018   Hyperlipidemia associated with type 2 diabetes mellitus (HCC) 09/01/2018   Class 3 severe obesity with serious comorbidity and body mass index (BMI) of 45.0 to 49.9 in adult (HCC) 09/01/2018   OSA on  CPAP 05/04/2018   Solitary pulmonary nodule 05/04/2018   History of prophylactic mastectomy of both breasts 11/04/2017   Preop examination 10/20/2017   Atrophic vaginitis 04/12/2016   Osteopenia 09/27/2015   Branch retinal vein occlusion of left eye 12/12/2014   Hypertensive retinopathy of both eyes 12/12/2014   Lung nodule seen on imaging study 05/01/2014   Shortness of breath on exertion    Type 2 diabetes mellitus with other specified complication (HCC) 03/17/2013   Morbid obesity (HCC) 03/01/2013   Carcinoid syndrome 02/21/2013   HSV infection 09/14/2012   Chronic pain of both knees 03/17/2012   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) 10/22/2010   Hypertension associated with type 2 diabetes mellitus (HCC) 10/22/2010   GERD (gastroesophageal reflux disease)  10/22/2010   Allergic rhinitis 10/22/2010   Sarcoidosis 10/22/2010   OA (osteoarthritis) of knee 10/22/2010   Anxiety 10/22/2010    PCP: Bary Leriche, PA-C   REFERRING PROVIDER: Allwardt, Crist Infante, PA-C   REFERRING DIAG: Osteoarthritis of right knee, unspecified osteoarthritis type   THERAPY DIAG:  Chronic pain of right knee  Muscle weakness (generalized)  Other abnormalities of gait and mobility  Rationale for Evaluation and Treatment: Rehabilitation  ONSET DATE: 2022  SUBJECTIVE:   SUBJECTIVE STATEMENT: Patient feels like she is getting a little better.    Initial subjective: R TKR 2022, It now sounds like I am stepping on a bag of ruffles.  It is not as strong as it used to be.  Left knee 2024 March. Left knee is much better.   PERTINENT HISTORY: Pt PMH includes but is not limited to: HDL, DM II, OSA on CPAP, B mastectomy, carcinoid syndrome, metastatic neuroendocine ca, and L TKA (2022).  PAIN:  Are you having pain? No:  NPRS scale: current 0/10 Pain location:  Pain description:   Aggravating factors: walking distances no greater than 5 mins Relieving factors: elevating; sitting  PRECAUTIONS: Fall  RED FLAGS: None   WEIGHT BEARING RESTRICTIONS: No  FALLS:  Has patient fallen in last 6 months? No  LIVING ENVIRONMENT: Lives with: lives with their spouse Lives in: House/apartment Stairs: Yes: External: 3 steps; can reach both Has following equipment at home: Single point cane, Walker - 2 wheeled, Tour manager, and Grab bars   PLOF: Independent  PATIENT GOALS: ease the crackle, strengthen, care of 4 yo grandson/care for.  NEXT MD VISIT: oncologist  OBJECTIVE:    PATIENT SURVEYS:  LEFS:28/80  COGNITION: Overall cognitive status: Within functional limits for tasks assessed     SENSATION: WFL  EDEMA:  none  MUSCLE LENGTH: Hamstrings:full   POSTURE: forward head  PALPATION: Left knee crepitus.  LOWER EXTREMITY ROM:  Active  ROM Right eval Left eval  Hip flexion wfl wfl  Hip extension    Hip abduction    Hip adduction    Hip internal rotation    Hip external rotation    Knee flexion 125 125  Knee extension 0 0  Ankle dorsiflexion    Ankle plantarflexion    Ankle inversion    Ankle eversion     (Blank rows = not tested)  LOWER EXTREMITY MMT:  MMT Right eval Left eval  Hip flexion 49.1 50.9  Hip extension    Hip abduction 34.5 33.4  Hip adduction    Hip internal rotation    Hip external rotation    Knee flexion    Knee extension 30.7 34.2  Ankle dorsiflexion    Ankle plantarflexion  Ankle inversion    Ankle eversion     (Blank rows = not tested)   FUNCTIONAL TESTS:  5 times sit to stand: 13.80 Timed up and go (TUG): 11.31 4 stage:SLS x 5s 3 min walk test:426ft around pool.  10/22/22:  5x STS: 11.95s  GAIT: Distance walked: 400 Assistive device utilized: None Level of assistance: SBA Comments: Slowed cadence, shorted step length, decreased hip flex with swing   TODAY'S TREATMENT:                                                                                                                              10/3 Saq 3x10  Seated clamshell 3x15 blue   Mini squats 3x10   Step up 2x10 bilateral  Lateral step up 2x10 bilateral  Step down 2x10 bilateral   All 4 inch      11/11/22 Pt seen for aquatic therapy today.  Treatment took place in water 3.5-4.75 ft in depth at the [facility] pool. Temp of water was 91.  Pt entered/exited the pool via stairs using step to pattern with hand rail.  *Without support: walking forward and backward , side stepping - 3 laps of each * unilateral farmer carry with yellow HB  at side, marching forward/ backward  *side lunge with UE add/abd yellow HB *forward lunges x 10 *backward lunges x 10 *squats bottom step x8; 2nd to bottom x 7; 3rd from bottom x5 *STS from 4th step x8;  *add set using orange BB x 8 5 s hold; STS with add set  x 5 4th step. *tandem stance 75% submerged indep *SLS 75% then 50% submerged. After several tries pt successful holding x 10s  11/07/22 Nustep level 5 seat 3, 5 minutes for dynamic warm up Step up 4 inch 2 x 10 BLE Lateral step up 4 inch 2 x 10 BLE Lunge 2 x 10 bilateral  Tandem stance 3x 30 second holds bilateral Squat with UE support 2 x10   11/05/22 Pt seen for aquatic therapy today.  Treatment took place in water 3.5-4.75 ft in depth at the [facility] pool. Temp of water was 91.  Pt entered/exited the pool via stairs using step to pattern with hand rail.  *Without support: walking forward and backward , side stepping - 3 laps of each * farmer carry with rainbow hand floats at side, marching forward/ backward  * SLS with hands out of water x 2 reps, up to 15-20 sec each LE; SLS on first step without UE support x 3 reps, 5-15 seconds * UE support on rainbow hand floats:  LE swings into hip flex/ext and hip abdc/ addct (crossing midline) 2 sets of 10 of each (2nd set smaller and quicker) *cycling without UE support on noodle x 6 widths * UE on noodle: curtsy lunges x 10; then opp knee /arm lift with noodle press x 5 each side *quad stretch with foot on 2nd step - x20s x  2 each LE   10/30/22 Quad set 10 x 10 second holds SLR 2 x 10 RLE Sidelying hip abduction 2 x 10 RLE LAQ 2 x 10 with 5 second holds RLE STS 2 x 10  Step up 4 inch 2 x 10 RLE Lateral step up 4 inch 1 x 10 RLE Standing hip abduction 1x10 bilateral  10/28/22 Pt seen for aquatic therapy today.  Treatment took place in water 3.5-4.75 ft in depth at the [facility] pool. Temp of water was 91.  Pt entered/exited the pool via stairs using step to pattern with hand rail.  *Without support: walking forward and backward , side stepping *slight side lunge with arm addct with rainbow hand floats x 4 widths * farmer carry with rainbow hand floats at side, marching forward/ backward  *quad and hip flex  stretch using noodle ue support on wall.  Min assist needed to get into position. *Step ups bottom step leading R/L x10. No Ue support once exercise initiated *cycling on noodle x 6 widths *SLS R/L 3.6 ft yellow HB-> rainbow HB->unsupported.  Progressed to bottom step ue support on hand rails as needed *UE support on wall: curtsy squats x10 * UE on rainbow hand floats:  LE swings into hip flex/ ext x 10; relaxed squats, traditional squats   Pt requires the buoyancy and hydrostatic pressure of water for support, and to offload joints by unweighting joint load by at least 50 % in navel deep water and by at least 75-80% in chest to neck deep water.  Viscosity of the water is needed for resistance of strengthening. Water current perturbations provides challenge to standing balance requiring increased core activation.  PATIENT EDUCATION:  Education details: aquatic therapy exercise rationale, progressions/ modifications  Person educated: Patient  Education method: Explanation Education comprehension: verbalized understanding  HOME EXERCISE PROGRAM: Pt following land based HEP given to her from last session of therapy ([facility])  Access Code: [email or web address] URL: [email or web address] Date: 10/30/2022 - Supine Quadricep Sets  - 2 x daily - 7 x weekly - 10 reps - 10 second holds hold - Active Straight Leg Raise with Quad Set (Mirrored)  - 2 x daily - 7 x weekly - 2 sets - 10 reps - Seated Long Arc Quad  - 2 x daily - 7 x weekly - 10 reps - 5-10 second holds hold - Sit to Stand with Arms Crossed  - 2 x daily - 7 x weekly - 2 sets - 10 reps  Aquatic Access Code: [email or web address] URL: [email or web address] Date: 11/05/2022 * not issued yet, still compiling exercises  ASSESSMENT:  CLINICAL IMPRESSION: The patient tolerated treatment well. She had no significant pain with exercises. We were somewhat limited by time. We will continue to progress strengthening exercises as  tolerated.       Initial Impression Patient is a 62 y.o. f who was seen today for physical therapy evaluation and treatment for right knee pain s/p TKR 2022. Has had a Left TKR March 2024.  Pt complains of left knee "cracking and popping" with some minimal pain associated. Crepitus palpated with left knee extension supports complaints. Her ROM bilat knees are good.  Strength in LE are symmetrical although weakness is noted throughout. She reports she is raising her 4yo grandson and needs to be able to get on floor with him as well as left objects she would not be lifting otherwise. She is battling metastatic liver cancer which may decrease her ability to tolerate therapy and slow  her progression.  She states she does get winded quickly with walking up to 5 mins. She will benefit from skilled physical therapy intervention.  Will begin in aquatic setting to use the properties of water to progress strength and improve balance in low loaded and safe setting then will progress to land based for added strengthening, balance and gait for improvement in functional mobility and endurance.  OBJECTIVE IMPAIRMENTS: Abnormal gait, decreased balance, decreased endurance, difficulty walking, decreased strength, and pain.   ACTIVITY LIMITATIONS: lifting, bending, squatting, stairs, and locomotion level  PARTICIPATION LIMITATIONS: cleaning, shopping, community activity, and yard work  PERSONAL FACTORS: Fitness, Past/current experiences, and 1 comorbidity: Liver ca  are also affecting patient's functional outcome.   REHAB POTENTIAL: Good  CLINICAL DECISION MAKING: Evolving/moderate complexity  EVALUATION COMPLEXITY: Moderate   GOALS: Goals reviewed with patient? Yes  SHORT TERM GOALS: Target date: Sept 27, 2024 Pt will tolerate full aquatic sessions consistently without increase in pain and with improving function to demonstrate good toleration and effectiveness of intervention.  Baseline: Goal  status:10/28/22 Met  2.  Pt will improve on 5 X STS test to <or= 10  to demonstrate improving functional lower extremity strength, transitional movements, and balance Baseline: 13.80 at eval,  11.90 on 10/22/22 Goal status: In progress    3.  Pt will be able to complete SLS R/L x 20s on bottom step in pool to demonstrate improving balance Baseline: 5 s land Goal status: Met- 11/05/22  4. Pt will be instructed and indep with rising from floor.  Baseline: can do it but its hard  Goal: INITIAL    LONG TERM GOALS: Target date: Dec 01, 2022  Pt will improve on LEFS by at least 9 points to demonstrate statistically significant change in pt functional ability Baseline: 28/80 Goal status: INITIAL  2.  Walk through grocery store pushing cart to demonstrate improved toleration to amb Baseline: unable Goal status: In Progress 10/28/22  3.  3 MWT to improve to 650ft to demonstrate improved toleration to amb. Baseline: 400 Goal status: INITIAL  4.  PT will improve SLS up to or >15s Baseline: 5 Goal status: INITIAL  5.  5.  Pt will be indep with final HEP's (land and aquatic as appropriate) for continued management of condition Baseline:  Goal status: INITIAL  6.  Pt will improve strength in all areas listed by at least 10lbs to demonstrate improved overall physical function Baseline: see chart Goal status: INITIAL   PLAN:  PT FREQUENCY: 1-2x/week  PT DURATION: 8 weeks 12 visit.  Extended weeks to allow for scheduling conflicts  PLANNED INTERVENTIONS: Therapeutic exercises, Therapeutic activity, Neuromuscular re-education, Balance training, Gait training, Patient/Family education, Self Care, Joint mobilization, Joint manipulation, Stair training, Orthotic/Fit training, DME instructions, Aquatic Therapy, Dry Needling, Electrical stimulation, Cryotherapy, Moist heat, scar mobilization, Taping, Ultrasound, Ionotophoresis 4mg /ml Dexamethasone, Manual therapy, and Re-evaluation  PLAN FOR  NEXT SESSION: Aquatics: LE and core strengthening; balance retraining; gait and endurance retraining Land: instruct on use of approp equipment (pt to join gym), le strengthening, floor transfers, body mechanics.   12:24 PM, 11/13/22 Rushie Chestnut) Lilian Coma MPT Physical Therapist at [facility] [address] [place], [place], [address] Phone: [phone]   Fax:  [phone]

## 2022-11-14 ENCOUNTER — Encounter: Payer: Self-pay | Admitting: Physician Assistant

## 2022-11-14 ENCOUNTER — Other Ambulatory Visit: Payer: Self-pay

## 2022-11-14 DIAGNOSIS — J301 Allergic rhinitis due to pollen: Secondary | ICD-10-CM

## 2022-11-14 MED ORDER — MONTELUKAST SODIUM 10 MG PO TABS: ORAL_TABLET | ORAL | 2 refills | Status: DC

## 2022-11-17 ENCOUNTER — Other Ambulatory Visit: Payer: Self-pay | Admitting: Physician Assistant

## 2022-11-17 DIAGNOSIS — N644 Mastodynia: Secondary | ICD-10-CM

## 2022-11-18 ENCOUNTER — Ambulatory Visit (HOSPITAL_BASED_OUTPATIENT_CLINIC_OR_DEPARTMENT_OTHER): Payer: BC Managed Care – PPO | Admitting: Physical Therapy

## 2022-11-18 ENCOUNTER — Encounter (HOSPITAL_BASED_OUTPATIENT_CLINIC_OR_DEPARTMENT_OTHER): Payer: Self-pay | Admitting: Physical Therapy

## 2022-11-18 DIAGNOSIS — M25561 Pain in right knee: Secondary | ICD-10-CM | POA: Diagnosis not present

## 2022-11-18 DIAGNOSIS — M6281 Muscle weakness (generalized): Secondary | ICD-10-CM

## 2022-11-18 DIAGNOSIS — R2689 Other abnormalities of gait and mobility: Secondary | ICD-10-CM | POA: Diagnosis not present

## 2022-11-18 DIAGNOSIS — G8929 Other chronic pain: Secondary | ICD-10-CM | POA: Diagnosis not present

## 2022-11-18 NOTE — Therapy (Signed)
OUTPATIENT PHYSICAL THERAPY LOWER EXTREMITY TREATMENT   Patient Name: Leah [name] MRN: [id number] DOB:27-Jun-1960, 62 y.o., female Today's Date: 11/18/2022  END OF SESSION:  PT End of Session - 11/18/22 1004     Visit Number 11    Number of Visits 12    Date for PT Re-Evaluation 12/01/22    Authorization Type BC[employer]    PT Start Time [phone]   arrives late   PT Stop Time 1030    PT Time Calculation (min) 35 min    Activity Tolerance Patient tolerated treatment well    Behavior During Therapy [name] for tasks assessed/performed                 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   Constipation    Diabetes (HCC)    Dyspnea    with exertion   Fatty liver    Gallbladder problem    GE reflux    Hepatic encephalopathy (HCC) 2011   History of PCR DNA positive for HSV1    History of retinal vein occlusion, right eye    HTN (hypertension) 2011   Joint pain    Liver problem    Neuroendocrine tumor 2011   Orthostatic hypotension    Osteoarthritis    Osteopenia    Palpitations    Sarcoidosis 2008   Sleep apnea    uses cpap   Surgical menopause    Past Surgical History:  Procedure Laterality Date   ABDOMINAL HYSTERECTOMY  2004   AUGMENTATION MAMMAPLASTY     BREAST BIOPSY Left    BREAST RECONSTRUCTION Bilateral 05/07/2020   Procedure: Revision of bilateral breast reconstruction;  Surgeon: Allena Napoleon, MD;  Location: [facility];  Service: Plastics;  Laterality: Bilateral;  2 hours   CESAREAN SECTION  [id number]   CHOLECYSTECTOMY  2006   HERNIA REPAIR     KNEE ARTHROSCOPY Left 09/2010   LAPAROSCOPIC HEPATECTOMY     LIPOSUCTION Bilateral 05/07/2020   Procedure: LIPOSUCTION with excision of lateral breast and axillary tissue;  Surgeon: Allena Napoleon, MD;  Location: [facility];  Service: Plastics;  Laterality: Bilateral;   LIVER SURGERY     MASTECTOMY Bilateral 2008   NASAL SINUS SURGERY     OOPHORECTOMY  2004   RECONSTRUCTION BREAST W/ LATISSIMUS DORSI FLAP Bilateral    TONSILLECTOMY  1976   TOTAL KNEE ARTHROPLASTY Left 10/29/2020   Procedure: TOTAL KNEE ARTHROPLASTY;  Surgeon: Ollen Gross, MD;  Location: [facility];  Service: Orthopedics;  Laterality: Left;   TOTAL KNEE ARTHROPLASTY Right 04/14/2022   Procedure: TOTAL KNEE ARTHROPLASTY;  Surgeon: Ollen Gross, MD;  Location: [facility];  Service: Orthopedics;  Laterality: Right;   TUBAL LIGATION Bilateral 2001   Patient Active Problem List   Diagnosis Date Noted   Primary osteoarthritis of right knee 04/14/2022   Primary osteoarthritis of left knee 10/29/2020   At risk for dehydration 07/04/2020   Depression 07/04/2020   Other fatigue 07/07/2019   Hepatic steatosis 01/03/2019   S/P right knee arthroscopy 10/19/2018   Hyperlipidemia associated with type 2 diabetes mellitus (HCC) 09/01/2018   Class 3 severe obesity with serious comorbidity and body mass index (BMI) of 45.0 to 49.9 in adult (HCC) 09/01/2018   OSA on CPAP  05/04/2018   Solitary pulmonary nodule 05/04/2018   History of prophylactic mastectomy of both breasts 11/04/2017   Preop examination 10/20/2017   Atrophic vaginitis 04/12/2016   Osteopenia 09/27/2015   Branch retinal vein occlusion of left eye 12/12/2014   Hypertensive retinopathy of both eyes 12/12/2014   Lung nodule seen on imaging study 05/01/2014   Shortness of breath on exertion    Type 2 diabetes mellitus with other specified complication (HCC) 03/17/2013   Morbid obesity (HCC) 03/01/2013   Carcinoid syndrome 02/21/2013   HSV infection 09/14/2012   Chronic pain of both knees 03/17/2012   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) 10/22/2010   Hypertension associated with type 2 diabetes mellitus (HCC) 10/22/2010   GERD (gastroesophageal reflux disease)  10/22/2010   Allergic rhinitis 10/22/2010   Sarcoidosis 10/22/2010   OA (osteoarthritis) of knee 10/22/2010   Anxiety 10/22/2010    PCP: Bary Leriche, PA-C   REFERRING PROVIDER: Allwardt, Crist Infante, PA-C   REFERRING DIAG: Osteoarthritis of right knee, unspecified osteoarthritis type   THERAPY DIAG:  Chronic pain of right knee  Muscle weakness (generalized)  Other abnormalities of gait and mobility  Rationale for Evaluation and Treatment: Rehabilitation  ONSET DATE: 2022  SUBJECTIVE:   SUBJECTIVE STATEMENT: "Knees feel ok they just make  lot of noise"   Initial subjective: R TKR 2022, It now sounds like I am stepping on a bag of ruffles.  It is not as strong as it used to be.  Left knee 2024 March. Left knee is much better.   PERTINENT HISTORY: Pt PMH includes but is not limited to: HDL, DM II, OSA on CPAP, B mastectomy, carcinoid syndrome, metastatic neuroendocine ca, and L TKA (2022).  PAIN:  Are you having pain? No:  NPRS scale: current 0/10 Pain location:  Pain description:   Aggravating factors: walking distances no greater than 5 mins Relieving factors: elevating; sitting  PRECAUTIONS: Fall  RED FLAGS: None   WEIGHT BEARING RESTRICTIONS: No  FALLS:  Has patient fallen in last 6 months? No  LIVING ENVIRONMENT: Lives with: lives with their spouse Lives in: House/apartment Stairs: Yes: External: 3 steps; can reach both Has following equipment at home: Single point cane, Walker - 2 wheeled, Tour manager, and Grab bars   PLOF: Independent  PATIENT GOALS: ease the crackle, strengthen, care of 4 yo grandson/care for.  NEXT MD VISIT: oncologist  OBJECTIVE:    PATIENT SURVEYS:  LEFS:28/80  COGNITION: Overall cognitive status: Within functional limits for tasks assessed     SENSATION: WFL  EDEMA:  none  MUSCLE LENGTH: Hamstrings:full   POSTURE: forward head  PALPATION: Left knee crepitus.  LOWER EXTREMITY ROM:  Active ROM  Right eval Left eval  Hip flexion wfl wfl  Hip extension    Hip abduction    Hip adduction    Hip internal rotation    Hip external rotation    Knee flexion 125 125  Knee extension 0 0  Ankle dorsiflexion    Ankle plantarflexion    Ankle inversion    Ankle eversion     (Blank rows = not tested)  LOWER EXTREMITY MMT:  MMT Right eval Left eval  Hip flexion 49.1 50.9  Hip extension    Hip abduction 34.5 33.4  Hip adduction    Hip internal rotation    Hip external rotation    Knee flexion    Knee extension 30.7 34.2  Ankle dorsiflexion    Ankle plantarflexion    Ankle  inversion    Ankle eversion     (Blank rows = not tested)   FUNCTIONAL TESTS:  5 times sit to stand: 13.80 Timed up and go (TUG): 11.31 4 stage:SLS x 5s 3 min walk test:446ft around pool.  10/22/22:  5x STS: 11.95s  GAIT: Distance walked: 400 Assistive device utilized: None Level of assistance: SBA Comments: Slowed cadence, shorted step length, decreased hip flex with swing   TODAY'S TREATMENT:                                                                                                                              Pt seen for aquatic therapy today.  Treatment took place in water 3.5-4.75 ft in depth at the [facility] pool. Temp of water was 91.  Pt entered/exited the pool via stairs using step to pattern with hand rail.  *Without support: walking forward and backward , side stepping - 3 laps of each * unilateral and bilat farmer carry with yellow HB  at side, marching forward/ backward  *side lunge with UE add/abd yellow HB x 4 widths 6 inch step submerged ~50% - Step downs from bottom step x 8 R/L ue support hand rail (difficult)  - step ups 2 x 10 *add set using orange BB x 8 5 s hold; STS with add set 2x 5 4th step. *Cycling on noodle.   10/3 Saq 3x10  Seated clamshell 3x15 blue   Mini squats 3x10   Step up 2x10 bilateral  Lateral step up 2x10 bilateral  Step down 2x10  bilateral   All 4 inch      11/11/22 Pt seen for aquatic therapy today.  Treatment took place in water 3.5-4.75 ft in depth at the [facility] pool. Temp of water was 91.  Pt entered/exited the pool via stairs using step to pattern with hand rail.  *Without support: walking forward and backward , side stepping - 3 laps of each * unilateral farmer carry with yellow HB  at side, marching forward/ backward  *side lunge with UE add/abd yellow HB *forward lunges x 10 *backward lunges x 10 *squats bottom step x8; 2nd to bottom x 7; 3rd from bottom x5 *STS from 4th step x8;  *add set using orange BB x 8 5 s hold; STS with add set x 5 4th step. *tandem stance 75% submerged indep *SLS 75% then 50% submerged. After several tries pt successful holding x 10s  11/07/22 Nustep level 5 seat 3, 5 minutes for dynamic warm up Step up 4 inch 2 x 10 BLE Lateral step up 4 inch 2 x 10 BLE Lunge 2 x 10 bilateral  Tandem stance 3x 30 second holds bilateral Squat with UE support 2 x10   11/05/22 Pt seen for aquatic therapy today.  Treatment took place in water 3.5-4.75 ft in depth at the [facility] pool. Temp of water was 91.  Pt entered/exited the pool via stairs using step to  pattern with hand rail.  *Without support: walking forward and backward , side stepping - 3 laps of each * farmer carry with rainbow hand floats at side, marching forward/ backward  * SLS with hands out of water x 2 reps, up to 15-20 sec each LE; SLS on first step without UE support x 3 reps, 5-15 seconds * UE support on rainbow hand floats:  LE swings into hip flex/ext and hip abdc/ addct (crossing midline) 2 sets of 10 of each (2nd set smaller and quicker) *cycling without UE support on noodle x 6 widths * UE on noodle: curtsy lunges x 10; then opp knee /arm lift with noodle press x 5 each side *quad stretch with foot on 2nd step - x20s x 2 each LE    PATIENT EDUCATION:  Education details: aquatic therapy  exercise rationale, progressions/ modifications  Person educated: Patient  Education method: Explanation Education comprehension: verbalized understanding  HOME EXERCISE PROGRAM: Pt following land based HEP given to her from last session of therapy ([facility])  Access Code: [email or web address] URL: [email or web address] Date: 10/30/2022 - Supine Quadricep Sets  - 2 x daily - 7 x weekly - 10 reps - 10 second holds hold - Active Straight Leg Raise with Quad Set (Mirrored)  - 2 x daily - 7 x weekly - 2 sets - 10 reps - Seated Long Arc Quad  - 2 x daily - 7 x weekly - 10 reps - 5-10 second holds hold - Sit to Stand with Arms Crossed  - 2 x daily - 7 x weekly - 2 sets - 10 reps  Aquatic Access Code:  Access Code: [email or web address] URL: [email or web address] Date: 11/18/2022 Prepared by: Geni Bers  Exercises - Farmer's Walk holding hand float or noodle under water  - 1-3 x weekly - Side Stepping with Hand Floats  - 1-3 x weekly - Leg Swings Side to Side - hold wall or noodle  - 3 x weekly - 1-2 sets - 10 reps - Forward Backward Leg Swing - hold wall or noodle   - 3 x weekly - 1-2 sets - 10 reps - Sit to Stand  - 1 x daily - 7 x weekly - 3 sets - 10 reps - Water Step Up on Bottom Step  - 1 x daily - 7 x weekly - 3 sets - 10 reps - Squat  - 1 x daily - 7 x weekly - 3 sets - 10 reps - Tandem Stance  - 1 x daily - 7 x weekly - 3 sets - 10 reps - Seated Straddle on Flotation Forward Breast Stroke Arms and Bicycle Legs  - 1 x daily - 7 x weekly - 3 sets - 10 reps Not issued  ASSESSMENT:  CLINICAL IMPRESSION: Progressed therapy following land based lead completing many of the exercises in pool.  She tolerates well without increase in pain. Final aquatic HEP is being created and will be issues next session.  She will have reached her max potential in aquatics at that time.  Next session land based. She may continue to benefit from land based intervention as per testing should  indicate. Plan: plz test , SLS and strength next land based session.       Initial Impression Patient is a 62 y.o. f who was seen today for physical therapy evaluation and treatment for right knee pain s/p TKR 2022. Has had a Left TKR March 2024.  Pt complains of left knee "cracking and popping" with some minimal  pain associated. Crepitus palpated with left knee extension supports complaints. Her ROM bilat knees are good.  Strength in LE are symmetrical although weakness is noted throughout. She reports she is raising her 4yo grandson and needs to be able to get on floor with him as well as left objects she would not be lifting otherwise. She is battling metastatic liver cancer which may decrease her ability to tolerate therapy and slow her progression.  She states she does get winded quickly with walking up to 5 mins. She will benefit from skilled physical therapy intervention.  Will begin in aquatic setting to use the properties of water to progress strength and improve balance in low loaded and safe setting then will progress to land based for added strengthening, balance and gait for improvement in functional mobility and endurance.  OBJECTIVE IMPAIRMENTS: Abnormal gait, decreased balance, decreased endurance, difficulty walking, decreased strength, and pain.   ACTIVITY LIMITATIONS: lifting, bending, squatting, stairs, and locomotion level  PARTICIPATION LIMITATIONS: cleaning, shopping, community activity, and yard work  PERSONAL FACTORS: Fitness, Past/current experiences, and 1 comorbidity: Liver ca  are also affecting patient's functional outcome.   REHAB POTENTIAL: Good  CLINICAL DECISION MAKING: Evolving/moderate complexity  EVALUATION COMPLEXITY: Moderate   GOALS: Goals reviewed with patient? Yes  SHORT TERM GOALS: Target date: Sept 27, 2024 Pt will tolerate full aquatic sessions consistently without increase in pain and with improving function to demonstrate good  toleration and effectiveness of intervention.  Baseline: Goal status:10/28/22 Met  2.  Pt will improve on 5 X STS test to <or= 10  to demonstrate improving functional lower extremity strength, transitional movements, and balance Baseline: 13.80 at eval,  11.90 on 10/22/22 Goal status: In progress    3.  Pt will be able to complete SLS R/L x 20s on bottom step in pool to demonstrate improving balance Baseline: 5 s land Goal status: Met- 11/05/22  4. Pt will be instructed and indep with rising from floor.  Baseline: can do it but its hard  Goal: INITIAL    LONG TERM GOALS: Target date: Dec 01, 2022  Pt will improve on LEFS by at least 9 points to demonstrate statistically significant change in pt functional ability Baseline: 28/80 Goal status: INITIAL  2.  Walk through grocery store pushing cart to demonstrate improved toleration to amb Baseline: unable Goal status: In Progress 10/28/22  3.  3 MWT to improve to 633ft to demonstrate improved toleration to amb. Baseline: 400 Goal status: INITIAL  4.  PT will improve SLS up to or >15s Baseline: 5 Goal status: INITIAL  5.  5.  Pt will be indep with final HEP's (land and aquatic as appropriate) for continued management of condition Baseline:  Goal status: INITIAL  6.  Pt will improve strength in all areas listed by at least 10lbs to demonstrate improved overall physical function Baseline: see chart Goal status: INITIAL   PLAN:  PT FREQUENCY: 1-2x/week  PT DURATION: 8 weeks 12 visit.  Extended weeks to allow for scheduling conflicts  PLANNED INTERVENTIONS: Therapeutic exercises, Therapeutic activity, Neuromuscular re-education, Balance training, Gait training, Patient/Family education, Self Care, Joint mobilization, Joint manipulation, Stair training, Orthotic/Fit training, DME instructions, Aquatic Therapy, Dry Needling, Electrical stimulation, Cryotherapy, Moist heat, scar mobilization, Taping, Ultrasound, Ionotophoresis  4mg /ml Dexamethasone, Manual therapy, and Re-evaluation  PLAN FOR NEXT SESSION: Aquatics: LE and core strengthening; balance retraining; gait and endurance retraining Land: instruct on use of approp equipment (pt to join gym), le strengthening, floor transfers, body mechanics.  1:37 PM, 11/18/22 Rushie Chestnut) Lilian Coma MPT Physical Therapist at [facility] [address] [place], [place], [address] Phone: [phone]   Fax:  [phone]

## 2022-11-20 ENCOUNTER — Encounter (HOSPITAL_BASED_OUTPATIENT_CLINIC_OR_DEPARTMENT_OTHER): Payer: Self-pay

## 2022-11-20 ENCOUNTER — Ambulatory Visit (HOSPITAL_BASED_OUTPATIENT_CLINIC_OR_DEPARTMENT_OTHER): Payer: BC Managed Care – PPO | Admitting: Physical Therapy

## 2022-11-22 ENCOUNTER — Encounter (HOSPITAL_BASED_OUTPATIENT_CLINIC_OR_DEPARTMENT_OTHER): Payer: Self-pay | Admitting: Physical Therapy

## 2022-11-22 ENCOUNTER — Ambulatory Visit (HOSPITAL_BASED_OUTPATIENT_CLINIC_OR_DEPARTMENT_OTHER): Payer: BC Managed Care – PPO | Admitting: Physical Therapy

## 2022-11-22 DIAGNOSIS — G8929 Other chronic pain: Secondary | ICD-10-CM | POA: Diagnosis not present

## 2022-11-22 DIAGNOSIS — M25561 Pain in right knee: Secondary | ICD-10-CM | POA: Diagnosis not present

## 2022-11-22 DIAGNOSIS — M6281 Muscle weakness (generalized): Secondary | ICD-10-CM | POA: Diagnosis not present

## 2022-11-22 DIAGNOSIS — R2689 Other abnormalities of gait and mobility: Secondary | ICD-10-CM

## 2022-11-22 NOTE — Therapy (Signed)
OUTPATIENT PHYSICAL THERAPY LOWER EXTREMITY TREATMENT   Patient Name: Leah [name] MRN: [id number] DOB:01/01/1961, 62 y.o., female Today's Date: 11/22/2022  END OF SESSION:  PT End of Session - 11/22/22 1250     Visit Number 12    Number of Visits 16    Date for PT Re-Evaluation 12/20/22    PT Start Time 0935    PT Stop Time 1015    PT Time Calculation (min) 40 min    Activity Tolerance Patient tolerated treatment well    Behavior During Therapy [facility] for tasks assessed/performed                  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   Constipation    Diabetes (HCC)    Dyspnea    with exertion   Fatty liver    Gallbladder problem    GE reflux    Hepatic encephalopathy (HCC) 2011   History of PCR DNA positive for HSV1    History of retinal vein occlusion, right eye    HTN (hypertension) 2011   Joint pain    Liver problem    Neuroendocrine tumor 2011   Orthostatic hypotension    Osteoarthritis    Osteopenia    Palpitations    Sarcoidosis 2008   Sleep apnea    uses cpap   Surgical menopause    Past Surgical History:  Procedure Laterality Date   ABDOMINAL HYSTERECTOMY  2004   AUGMENTATION MAMMAPLASTY     BREAST BIOPSY Left    BREAST RECONSTRUCTION Bilateral 05/07/2020   Procedure: Revision of bilateral breast reconstruction;  Surgeon: Allena Napoleon, MD;  Location: [facility];  Service: Plastics;  Laterality: Bilateral;  2 hours   CESAREAN SECTION  6063,0160   CHOLECYSTECTOMY  2006   HERNIA REPAIR     KNEE ARTHROSCOPY Left 09/2010   LAPAROSCOPIC HEPATECTOMY     LIPOSUCTION Bilateral 05/07/2020   Procedure: LIPOSUCTION with excision of lateral breast and axillary tissue;  Surgeon: Allena Napoleon, MD;  Location: [facility];  Service: Plastics;  Laterality:  Bilateral;   LIVER SURGERY     MASTECTOMY Bilateral 2008   NASAL SINUS SURGERY     OOPHORECTOMY  2004   RECONSTRUCTION BREAST W/ LATISSIMUS DORSI FLAP Bilateral    TONSILLECTOMY  1976   TOTAL KNEE ARTHROPLASTY Left 10/29/2020   Procedure: TOTAL KNEE ARTHROPLASTY;  Surgeon: Ollen Gross, MD;  Location: [facility];  Service: Orthopedics;  Laterality: Left;   TOTAL KNEE ARTHROPLASTY Right 04/14/2022   Procedure: TOTAL KNEE ARTHROPLASTY;  Surgeon: Ollen Gross, MD;  Location: [facility];  Service: Orthopedics;  Laterality: Right;   TUBAL LIGATION Bilateral 2001   Patient Active Problem List   Diagnosis Date Noted   Primary osteoarthritis of right knee 04/14/2022   Primary osteoarthritis of left knee 10/29/2020   At risk for dehydration 07/04/2020   Depression 07/04/2020   Other fatigue 07/07/2019   Hepatic steatosis 01/03/2019   S/P right knee arthroscopy 10/19/2018   Hyperlipidemia associated with type 2 diabetes mellitus (HCC) 09/01/2018   Class 3 severe obesity with serious comorbidity and body mass index (BMI) of 45.0 to 49.9 in adult (HCC) 09/01/2018   OSA on CPAP 05/04/2018   Solitary pulmonary nodule 05/04/2018  History of prophylactic mastectomy of both breasts 11/04/2017   Preop examination 10/20/2017   Atrophic vaginitis 04/12/2016   Osteopenia 09/27/2015   Branch retinal vein occlusion of left eye 12/12/2014   Hypertensive retinopathy of both eyes 12/12/2014   Lung nodule seen on imaging study 05/01/2014   Shortness of breath on exertion    Type 2 diabetes mellitus with other specified complication (HCC) 03/17/2013   Morbid obesity (HCC) 03/01/2013   Carcinoid syndrome 02/21/2013   HSV infection 09/14/2012   Chronic pain of both knees 03/17/2012   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) 10/22/2010   Hypertension associated with type 2 diabetes mellitus (HCC) 10/22/2010   GERD (gastroesophageal reflux disease) 10/22/2010   Allergic rhinitis 10/22/2010    Sarcoidosis 10/22/2010   OA (osteoarthritis) of knee 10/22/2010   Anxiety 10/22/2010    PCP: Bary Leriche, PA-C   REFERRING PROVIDER: Allwardt, Crist Infante, PA-C   REFERRING DIAG: Osteoarthritis of right knee, unspecified osteoarthritis type   THERAPY DIAG:  Chronic pain of right knee  Muscle weakness (generalized)  Other abnormalities of gait and mobility  Rationale for Evaluation and Treatment: Rehabilitation  ONSET DATE: 2022  SUBJECTIVE:   SUBJECTIVE STATEMENT: The patient reports the pain has been good. She has has no pain this morning. She has been working on her exercises    Initial subjective: R TKR 2022, It now sounds like I am stepping on a bag of ruffles.  It is not as strong as it used to be.  Left knee 2024 March. Left knee is much better.   PERTINENT HISTORY: Pt PMH includes but is not limited to: HDL, DM II, OSA on CPAP, B mastectomy, carcinoid syndrome, metastatic neuroendocine ca, and L TKA (2022).  PAIN:  Are you having pain? No:  NPRS scale: current 0/10 Pain location:  Pain description:   Aggravating factors: walking distances no greater than 5 mins Relieving factors: elevating; sitting  PRECAUTIONS: Fall  RED FLAGS: None   WEIGHT BEARING RESTRICTIONS: No  FALLS:  Has patient fallen in last 6 months? No  LIVING ENVIRONMENT: Lives with: lives with their spouse Lives in: House/apartment Stairs: Yes: External: 3 steps; can reach both Has following equipment at home: Single point cane, Walker - 2 wheeled, Tour manager, and Grab bars   PLOF: Independent  PATIENT GOALS: ease the crackle, strengthen, care of 4 yo grandson/care for.  NEXT MD VISIT: oncologist  OBJECTIVE:    PATIENT SURVEYS:  LEFS:28/80  COGNITION: Overall cognitive status: Within functional limits for tasks assessed     SENSATION: WFL  EDEMA:  none  MUSCLE LENGTH: Hamstrings:full   POSTURE: forward head  PALPATION: Left knee crepitus.  LOWER  EXTREMITY ROM:  Active ROM Right eval Left eval  Hip flexion wfl wfl  Hip extension    Hip abduction    Hip adduction    Hip internal rotation    Hip external rotation    Knee flexion 125 125  Knee extension 0 0  Ankle dorsiflexion    Ankle plantarflexion    Ankle inversion    Ankle eversion     (Blank rows = not tested)  LOWER EXTREMITY MMT:  MMT Right eval Left eval    Hip flexion 49.1 50.9 34.7 31.8  Hip extension      Hip abduction 34.5 33.4 30.6 31.2  Hip adduction      Hip internal rotation      Hip external rotation      Knee flexion  Knee extension 30.7 34.2 32.1 33.6  Ankle dorsiflexion      Ankle plantarflexion      Ankle inversion      Ankle eversion       (Blank rows = not tested)   FUNCTIONAL TESTS:  5 times sit to stand: 13.80 Timed up and go (TUG): 11.31 4 stage:SLS x 5s 3 min walk test:446ft around pool.  10/22/22:  5x STS: 11.95s  10/12  3 min walk test 575 feet  GAIT: Distance walked: 400 Assistive device utilized: None Level of assistance: SBA Comments: Slowed cadence, shorted step length, decreased hip flex with swing   TODAY'S TREATMENT:                                                                                                                              10/12  Supine SLR 3x10  Bridge 3x10    Last visit:Pt seen for aquatic therapy today.  Treatment took place in water 3.5-4.75 ft in depth at the [facility] pool. Temp of water was 91.  Pt entered/exited the pool via stairs using step to pattern with hand rail.  *Without support: walking forward and backward , side stepping - 3 laps of each * unilateral and bilat farmer carry with yellow HB  at side, marching forward/ backward  *side lunge with UE add/abd yellow HB x 4 widths 6 inch step submerged ~50% - Step downs from bottom step x 8 R/L ue support hand rail (difficult)  - step ups 2 x 10 *add set using orange BB x 8 5 s hold; STS with add set 2x 5 4th  step. *Cycling on noodle.   10/3 Saq 3x10  Seated clamshell 3x15 blue   Mini squats 3x10   Step up 2x10 bilateral  Lateral step up 2x10 bilateral  Step down 2x10 bilateral   All 4 inch      11/11/22 Pt seen for aquatic therapy today.  Treatment took place in water 3.5-4.75 ft in depth at the [facility] pool. Temp of water was 91.  Pt entered/exited the pool via stairs using step to pattern with hand rail.  *Without support: walking forward and backward , side stepping - 3 laps of each * unilateral farmer carry with yellow HB  at side, marching forward/ backward  *side lunge with UE add/abd yellow HB *forward lunges x 10 *backward lunges x 10 *squats bottom step x8; 2nd to bottom x 7; 3rd from bottom x5 *STS from 4th step x8;  *add set using orange BB x 8 5 s hold; STS with add set x 5 4th step. *tandem stance 75% submerged indep *SLS 75% then 50% submerged. After several tries pt successful holding x 10s  11/07/22 Nustep level 5 seat 3, 5 minutes for dynamic warm up Step up 4 inch 2 x 10 BLE Lateral step up 4 inch 2 x 10 BLE Lunge 2 x 10 bilateral  Tandem stance 3x 30 second  holds bilateral Squat with UE support 2 x10   11/05/22 Pt seen for aquatic therapy today.  Treatment took place in water 3.5-4.75 ft in depth at the [facility] pool. Temp of water was 91.  Pt entered/exited the pool via stairs using step to pattern with hand rail.  *Without support: walking forward and backward , side stepping - 3 laps of each * farmer carry with rainbow hand floats at side, marching forward/ backward  * SLS with hands out of water x 2 reps, up to 15-20 sec each LE; SLS on first step without UE support x 3 reps, 5-15 seconds * UE support on rainbow hand floats:  LE swings into hip flex/ext and hip abdc/ addct (crossing midline) 2 sets of 10 of each (2nd set smaller and quicker) *cycling without UE support on noodle x 6 widths * UE on noodle: curtsy lunges x 10;  then opp knee /arm lift with noodle press x 5 each side *quad stretch with foot on 2nd step - x20s x 2 each LE    PATIENT EDUCATION:  Education details: aquatic therapy exercise rationale, progressions/ modifications  Person educated: Patient  Education method: Explanation Education comprehension: verbalized understanding  HOME EXERCISE PROGRAM: Pt following land based HEP given to her from last session of therapy ([facility])  Access Code: [email or web address] URL: [email or web address] Date: 10/30/2022 - Supine Quadricep Sets  - 2 x daily - 7 x weekly - 10 reps - 10 second holds hold - Active Straight Leg Raise with Quad Set (Mirrored)  - 2 x daily - 7 x weekly - 2 sets - 10 reps - Seated Long Arc Quad  - 2 x daily - 7 x weekly - 10 reps - 5-10 second holds hold - Sit to Stand with Arms Crossed  - 2 x daily - 7 x weekly - 2 sets - 10 reps  Aquatic Access Code:  Access Code: [email or web address] URL: [email or web address] Date: 11/18/2022 Prepared by: Geni Bers  Exercises - Farmer's Walk holding hand float or noodle under water  - 1-3 x weekly - Side Stepping with Hand Floats  - 1-3 x weekly - Leg Swings Side to Side - hold wall or noodle  - 3 x weekly - 1-2 sets - 10 reps - Forward Backward Leg Swing - hold wall or noodle   - 3 x weekly - 1-2 sets - 10 reps - Sit to Stand  - 1 x daily - 7 x weekly - 3 sets - 10 reps - Water Step Up on Bottom Step  - 1 x daily - 7 x weekly - 3 sets - 10 reps - Squat  - 1 x daily - 7 x weekly - 3 sets - 10 reps - Tandem Stance  - 1 x daily - 7 x weekly - 3 sets - 10 reps - Seated Straddle on Flotation Forward Breast Stroke Arms and Bicycle Legs  - 1 x daily - 7 x weekly - 3 sets - 10 reps Not issued  ASSESSMENT:  CLINICAL IMPRESSION: The patient has progressed well. She has minimal pain at this time. Her strength numbers are about the same. Her walk test has improved. She plans on joining a gym soon. We reviewed her gym program  today. She has 1 more water appointment left. We will review and update her land program. We will likely D/C to HEP at that time.       Initial Impression Patient is a 62 y.o. f who was seen today  for physical therapy evaluation and treatment for right knee pain s/p TKR 2022. Has had a Left TKR March 2024.  Pt complains of left knee "cracking and popping" with some minimal pain associated. Crepitus palpated with left knee extension supports complaints. Her ROM bilat knees are good.  Strength in LE are symmetrical although weakness is noted throughout. She reports she is raising her 4yo grandson and needs to be able to get on floor with him as well as left objects she would not be lifting otherwise. She is battling metastatic liver cancer which may decrease her ability to tolerate therapy and slow her progression.  She states she does get winded quickly with walking up to 5 mins. She will benefit from skilled physical therapy intervention.  Will begin in aquatic setting to use the properties of water to progress strength and improve balance in low loaded and safe setting then will progress to land based for added strengthening, balance and gait for improvement in functional mobility and endurance.  OBJECTIVE IMPAIRMENTS: Abnormal gait, decreased balance, decreased endurance, difficulty walking, decreased strength, and pain.   ACTIVITY LIMITATIONS: lifting, bending, squatting, stairs, and locomotion level  PARTICIPATION LIMITATIONS: cleaning, shopping, community activity, and yard work  PERSONAL FACTORS: Fitness, Past/current experiences, and 1 comorbidity: Liver ca  are also affecting patient's functional outcome.   REHAB POTENTIAL: Good  CLINICAL DECISION MAKING: Evolving/moderate complexity  EVALUATION COMPLEXITY: Moderate   GOALS: Goals reviewed with patient? Yes  SHORT TERM GOALS: Target date: Sept 27, 2024 Pt will tolerate full aquatic sessions consistently without increase in pain  and with improving function to demonstrate good toleration and effectiveness of intervention.  Baseline: Goal status:10/28/22 Met  2.  Pt will improve on 5 X STS test to <or= 10  to demonstrate improving functional lower extremity strength, transitional movements, and balance Baseline: 13.80 at eval,  11.90 on 10/22/22 Goal status: In progress    3.  Pt will be able to complete SLS R/L x 20s on bottom step in pool to demonstrate improving balance Baseline: 5 s land Goal status: Met- 11/05/22  4. Pt will be instructed and indep with rising from floor.  Baseline: can do it but its hard  Goal: INITIAL    LONG TERM GOALS: Target date: Dec 01, 2022  Pt will improve on LEFS by at least 9 points to demonstrate statistically significant change in pt functional ability Baseline: 28/80 Goal status: INITIAL  2.  Walk through grocery store pushing cart to demonstrate improved toleration to amb Baseline: unable Goal status: In Progress 10/28/22  3.  3 MWT to improve to 674ft to demonstrate improved toleration to amb. Baseline: 400 Goal status: INITIAL  4.  PT will improve SLS up to or >15s Baseline: 5 Goal status: INITIAL  5.  5.  Pt will be indep with final HEP's (land and aquatic as appropriate) for continued management of condition Baseline:  Goal status: INITIAL  6.  Pt will improve strength in all areas listed by at least 10lbs to demonstrate improved overall physical function Baseline: see chart Goal status: INITIAL   PLAN:  PT FREQUENCY: 1-2x/week  PT DURATION: 8 weeks 12 visit.  Extended weeks to allow for scheduling conflicts  PLANNED INTERVENTIONS: Therapeutic exercises, Therapeutic activity, Neuromuscular re-education, Balance training, Gait training, Patient/Family education, Self Care, Joint mobilization, Joint manipulation, Stair training, Orthotic/Fit training, DME instructions, Aquatic Therapy, Dry Needling, Electrical stimulation, Cryotherapy, Moist heat, scar  mobilization, Taping, Ultrasound, Ionotophoresis 4mg /ml Dexamethasone, Manual therapy, and Re-evaluation  PLAN  FOR NEXT SESSION: Aquatics: LE and core strengthening; balance retraining; gait and endurance retraining Land: instruct on use of approp equipment (pt to join gym), le strengthening, floor transfers, body mechanics.   1:00 PM, 11/22/22 Lorayne Bender PT DPT Physical Therapist at [facility] [address] [place], [place], [address] Phone: [phone]   Fax:  [phone]

## 2022-11-24 ENCOUNTER — Inpatient Hospital Stay: Payer: BC Managed Care – PPO

## 2022-11-24 ENCOUNTER — Inpatient Hospital Stay (HOSPITAL_BASED_OUTPATIENT_CLINIC_OR_DEPARTMENT_OTHER): Payer: BC Managed Care – PPO | Admitting: Medical Oncology

## 2022-11-24 ENCOUNTER — Other Ambulatory Visit: Payer: Self-pay

## 2022-11-24 ENCOUNTER — Inpatient Hospital Stay: Payer: BC Managed Care – PPO | Attending: Hematology & Oncology

## 2022-11-24 ENCOUNTER — Encounter: Payer: Self-pay | Admitting: Medical Oncology

## 2022-11-24 VITALS — BP 122/88 | HR 85 | Temp 98.4°F | Resp 18 | Ht 59.0 in | Wt 167.1 lb

## 2022-11-24 DIAGNOSIS — C7B8 Other secondary neuroendocrine tumors: Secondary | ICD-10-CM

## 2022-11-24 DIAGNOSIS — C7A00 Malignant carcinoid tumor of unspecified site: Secondary | ICD-10-CM | POA: Diagnosis not present

## 2022-11-24 DIAGNOSIS — C7B02 Secondary carcinoid tumors of liver: Secondary | ICD-10-CM | POA: Diagnosis not present

## 2022-11-24 DIAGNOSIS — Z79899 Other long term (current) drug therapy: Secondary | ICD-10-CM | POA: Diagnosis not present

## 2022-11-24 DIAGNOSIS — R978 Other abnormal tumor markers: Secondary | ICD-10-CM | POA: Diagnosis not present

## 2022-11-24 LAB — CBC WITH DIFFERENTIAL (CANCER CENTER ONLY)
Abs Immature Granulocytes: 0.02 K/uL (ref 0.00–0.07)
Basophils Absolute: 0.1 K/uL (ref 0.0–0.1)
Basophils Relative: 1 %
Eosinophils Absolute: 0.2 K/uL (ref 0.0–0.5)
Eosinophils Relative: 2 %
HCT: 39.1 % (ref 36.0–46.0)
Hemoglobin: 12.9 g/dL (ref 12.0–15.0)
Immature Granulocytes: 0 %
Lymphocytes Relative: 30 %
Lymphs Abs: 2.8 K/uL (ref 0.7–4.0)
MCH: 29.5 pg (ref 26.0–34.0)
MCHC: 33 g/dL (ref 30.0–36.0)
MCV: 89.3 fL (ref 80.0–100.0)
Monocytes Absolute: 0.6 K/uL (ref 0.1–1.0)
Monocytes Relative: 7 %
Neutro Abs: 5.6 K/uL (ref 1.7–7.7)
Neutrophils Relative %: 60 %
Platelet Count: 298 K/uL (ref 150–400)
RBC: 4.38 MIL/uL (ref 3.87–5.11)
RDW: 13.1 % (ref 11.5–15.5)
WBC Count: 9.2 K/uL (ref 4.0–10.5)
nRBC: 0 % (ref 0.0–0.2)

## 2022-11-24 LAB — LACTATE DEHYDROGENASE: LDH: 137 U/L (ref 98–192)

## 2022-11-24 LAB — CMP (CANCER CENTER ONLY)
ALT: 10 U/L (ref 0–44)
AST: 13 U/L — ABNORMAL LOW (ref 15–41)
Albumin: 3.9 g/dL (ref 3.5–5.0)
Alkaline Phosphatase: 72 U/L (ref 38–126)
Anion gap: 9 (ref 5–15)
BUN: 13 mg/dL (ref 8–23)
CO2: 26 mmol/L (ref 22–32)
Calcium: 9.1 mg/dL (ref 8.9–10.3)
Chloride: 104 mmol/L (ref 98–111)
Creatinine: 0.72 mg/dL (ref 0.44–1.00)
GFR, Estimated: >60 mL/min
Glucose, Bld: 104 mg/dL — ABNORMAL HIGH (ref 70–99)
Potassium: 3.9 mmol/L (ref 3.5–5.1)
Sodium: 139 mmol/L (ref 135–145)
Total Bilirubin: 0.3 mg/dL (ref 0.3–1.2)
Total Protein: 7.3 g/dL (ref 6.5–8.1)

## 2022-11-24 MED ORDER — LANREOTIDE ACETATE 120 MG/0.5ML ~~LOC~~ SOLN
120.0000 mg | Freq: Once | SUBCUTANEOUS | Status: AC
  Administered 2022-11-24: 120 mg via SUBCUTANEOUS
  Filled 2022-11-24: qty 120

## 2022-11-24 NOTE — Progress Notes (Signed)
Hematology and Oncology Follow Up Visit  Leah [name] [id number] May 19, 1960 62 y.o. 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    She reports that she has been well since her last visit. Has some chronic dizziness for which she has been evaluated for- thought to be secondary t POTS. This has been stable. No visual changes, headaches, etc.   She has had no problems with diarrhea but she does find that she has to watch her diet to avoid side effects.   Her last chromogranin A level was 75.9 which is down from 120.4  She gets her [facility]'s done [facility]  Her last was on 01/06/2022. She will be due for her next in Nov 2024.   She has had no cough or shortness of breath.  She has had no leg swelling.  She has had no rashes.  There is been no bleeding. She has lost weight secondary to stress from her husband's recent surgery and illness.   Overall, I would say her performance status is probably ECOG 1.    Wt Readings from Last 3 Encounters:  11/24/22 167 lb 1.9 oz (75.8 kg)  10/21/22 173 lb (78.5 kg)  08/21/22 173 lb 1.9 oz (78.5 kg)   Medications:  Allergies as of 11/24/2022   No Known Allergies      Medication List        Accurate as of November 24, 2022 10:59 AM. If you have any questions, ask your nurse or doctor.          albuterol 108 (90 Base) MCG/ACT inhaler Commonly known as: VENTOLIN HFA Inhale 2 puffs into the lungs every 6 (six) hours as needed for wheezing or shortness of breath.   amLODipine 10 MG tablet Commonly known as: NORVASC TAKE 1 TABLET BY MOUTH EVERY DAY   clonazePAM 0.5 MG tablet Commonly known as: KLONOPIN TAKE 1 TABLET BY MOUTH 2 TIMES DAILY.   docusate sodium 100 MG capsule Commonly known as:  COLACE TAKE 1 CAPSULE BY MOUTH TWICE A DAY   fexofenadine 180 MG tablet Commonly known as: ALLEGRA TAKE 1 TABLET BY MOUTH EVERY DAY   gabapentin 100 MG capsule Commonly known as: NEURONTIN Take 1 capsule (100 mg total) by mouth at bedtime.   metFORMIN 500 MG tablet Commonly known as: GLUCOPHAGE Take 1 tablet (500 mg total) by mouth 2 (two) times daily with a meal.   methocarbamol 500 MG tablet Commonly known as: ROBAXIN Take 1 tablet (500 mg total) by mouth every 6 (six) hours as needed for muscle spasms.   metoprolol succinate 100 MG 24 hr tablet Commonly known as: TOPROL-XL TAKE 1 TABLET BY MOUTH EVERY DAY EVERY EVENING WITH OR IMMEDIATELY FOLLOWING A MEAL   montelukast 10 MG tablet Commonly known as: SINGULAIR TAKE 1 TABLET BY MOUTH EVERYDAY AT BEDTIME   Mounjaro 2.5 MG/0.5ML Pen Generic drug: tirzepatide Inject 2.5 mg into the skin once a week.   ondansetron 4 MG disintegrating tablet Commonly known as: ZOFRAN-ODT TAKE 1 TABLET BY MOUTH EVERY 8 HOURS AS NEEDED *INS MAX 18 TABS PER 21 DAYS*   pantoprazole 40 MG tablet Commonly known as: PROTONIX Take 1 tablet (40 mg total) by mouth 2 (two) times daily.   rosuvastatin 10 MG tablet Commonly known as: CRESTOR  Take 1 tablet (10 mg total) by mouth daily.   SANDOSTATIN IJ Inject 1 Dose as directed every 30 (thirty) days.   scopolamine 1 MG/3DAYS Commonly known as: TRANSDERM-SCOP Place 1 patch (1.5 mg total) onto the skin every 3 (three) days.   TURMERIC PO Take 1 capsule by mouth daily.   valACYclovir 500 MG tablet Commonly known as: VALTREX Take 1 tablet by mouth daily. What changed: Another medication with the same name was removed. Continue taking this medication, and follow the directions you see here. Changed by: Rushie Chestnut   valsartan 320 MG tablet Commonly known as: DIOVAN TAKE 1 TABLET BY MOUTH EVERY DAY   venlafaxine XR 150 MG 24 hr capsule Commonly known as: EFFEXOR-XR TAKE 1 CAPSULE BY  MOUTH EVERY DAY IN THE MORNING        Allergies: No Known Allergies  Past Medical History, Surgical history, Social history, and Family History were reviewed and updated.  Review of Systems: Review of Systems  Constitutional: Negative.   HENT: Negative.    Eyes: Negative.   Respiratory: Negative.    Cardiovascular: Negative.   Gastrointestinal: Negative.   Genitourinary: Negative.   Musculoskeletal: Negative.   Skin: Negative.   Neurological: Negative.   Endo/Heme/Allergies: Negative.   Psychiatric/Behavioral: Negative.     Physical Exam:  height is 4\' 11"  (1.499 m) and weight is 167 lb 1.9 oz (75.8 kg). Her oral temperature is 98.4 F (36.9 C). Her blood pressure is 122/88 and her pulse is 85. Her respiration is 18 and oxygen saturation is 100%.   Wt Readings from Last 3 Encounters:  11/24/22 167 lb 1.9 oz (75.8 kg)  10/21/22 173 lb (78.5 kg)  08/21/22 173 lb 1.9 oz (78.5 kg)    Physical Exam Vitals reviewed.  HENT:     Head: Normocephalic and atraumatic.  Eyes:     Pupils: Pupils are equal, round, and reactive to light.  Cardiovascular:     Rate and Rhythm: Normal rate and regular rhythm.     Heart sounds: Normal heart sounds.  Pulmonary:     Effort: Pulmonary effort is normal.     Breath sounds: Normal breath sounds.  Abdominal:     General: Bowel sounds are normal.     Palpations: Abdomen is soft.     Comments: Abdominal exam shows an obese abdomen that is soft.  She has good bowel sounds.  There is no fluid wave. There is no tenderness.  There is no guarding or rebound.  There is no palpable liver or spleen tip.  Musculoskeletal:        General: No tenderness or deformity.     Cervical back: Normal range of motion.  Lymphadenopathy:     Cervical: No cervical adenopathy.  Skin:    General: Skin is warm and dry.     Findings: No erythema or rash.  Neurological:     Mental Status: She is alert and oriented to person, place, and time.  Psychiatric:         Behavior: Behavior normal.        Thought Content: Thought content normal.        Judgment: Judgment normal.      Lab Results  Component Value Date   WBC 9.2 11/24/2022   HGB 12.9 11/24/2022   HCT 39.1 11/24/2022   MCV 89.3 11/24/2022   PLT 298 11/24/2022   Lab Results  Component Value Date   FERRITIN 382 (H) 10/18/2008   IRON <10 (L)  10/18/2008   TIBC NOT CALC Not calculated due to Iron <10. 10/18/2008   UIBC 156 10/18/2008   IRONPCTSAT NOT CALC Not calculated due to Iron <10. 10/18/2008   Lab Results  Component Value Date   RETICCTPCT 0.9 10/18/2008   RBC 4.38 11/24/2022   No results found for: "KPAFRELGTCHN", "LAMBDASER", "KAPLAMBRATIO" No results found for: "IGGSERUM", "IGA", "IGMSERUM" No results found for: "TOTALPROTELP", "ALBUMINELP", "A1GS", "A2GS", "BETS", "BETA2SER", "GAMS", "MSPIKE", "SPEI"   Chemistry      Component Value Date/Time   NA 141 10/24/2022 1018   NA 140 01/20/2017 1416   NA 141 01/17/2016 1047   K 5.5 (H) 10/24/2022 1018   K 3.9 01/20/2017 1416   K 3.2 (L) 01/17/2016 1047   CL 103 10/24/2022 1018   CL 102 01/20/2017 1416   CO2 31 10/24/2022 1018   CO2 26 01/20/2017 1416   CO2 27 01/17/2016 1047   BUN 13 10/24/2022 1018   BUN 15 01/20/2017 1416   BUN 6.6 (L) 01/17/2016 1047   CREATININE 0.85 10/24/2022 1018   CREATININE 1.1 01/20/2017 1416   CREATININE 0.7 01/17/2016 1047      Component Value Date/Time   CALCIUM 10.0 10/24/2022 1018   CALCIUM 9.1 01/20/2017 1416   CALCIUM 8.7 01/17/2016 1047   ALKPHOS 68 10/24/2022 1018   ALKPHOS 84 01/20/2017 1416   ALKPHOS 90 01/17/2016 1047   AST 13 (L) 10/24/2022 1018   AST 34 01/17/2016 1047   ALT 11 10/24/2022 1018   ALT 35 01/20/2017 1416   ALT 38 01/17/2016 1047   BILITOT 0.6 10/24/2022 1018   BILITOT 0.42 01/17/2016 1047      Encounter Diagnosis  Name Primary?   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 Christoffersen is a very pleasant  62 yo caucasian female with long standing history of metastatic carcinoid with recurrent liver metastasis. Somatuline 120 mg SQ monthly.   MR liver W/WO order placed for November.   Per Dr. Wilfred Lacy last note, if she has progressive disease, we could certainly consider using Lutathera.   Disposition: Somatuline today  RTC monthly x 2 injection only RTC 3 months MD, Labs ( LDH, CBC w/, CMP, Chromogranin A), injection(Somatuline)-[place]   Rushie Chestnut, PA-C 10/14/202410:59 AM

## 2022-11-24 NOTE — Patient Instructions (Signed)
Lanreotide Injection What is this medication? LANREOTIDE (lan REE oh tide) treats high levels of growth hormone (acromegaly). It is used when other therapies have not worked well enough or cannot be tolerated. It works by reducing the amount of growth hormone your body makes. This reduces symptoms and the risk of health problems caused by too much growth hormone, such as diabetes and heart disease. It may also be used to treat neuroendocrine tumors, a cancer of the cells that release hormones and other substances in your body. It works by slowing down the release of these substances from the cells. This slows tumor growth. It also decreases the symptoms of carcinoid syndrome, such as flushing or diarrhea. This medicine may be used for other purposes; ask your health care provider or pharmacist if you have questions. COMMON BRAND NAME(S): Somatuline [place] What should I tell my care team before I take this medication? They need to know if you have any of these conditions: Diabetes Gallbladder disease Heart disease Kidney disease Liver disease Thyroid disease An unusual or allergic reaction to lanreotide, other medications, foods, dyes, or preservatives Pregnant or trying to get pregnant Breast-feeding How should I use this medication? This medication is injected under the skin. It is given by your care team in a hospital or clinic setting. Talk to your care team about the use of this medication in children. Special care may be needed. Overdosage: If you think you have taken too much of this medicine contact a poison control center or emergency room at once. NOTE: This medicine is only for you. Do not share this medicine with others. What if I miss a dose? Keep appointments for follow-up doses. It is important not to miss your dose. Call your care team if you are unable to keep an appointment. What may interact with this medication? Bromocriptine Cyclosporine Certain medications for blood  pressure, heart disease, irregular heartbeat Certain medications for diabetes Quinidine Terfenadine This list may not describe all possible interactions. Give your health care provider a list of all the medicines, herbs, non-prescription drugs, or dietary supplements you use. Also tell them if you smoke, drink alcohol, or use illegal drugs. Some items may interact with your medicine. What should I watch for while using this medication? Visit your care team for regular checks on your progress. Tell your care team if your symptoms do not start to get better or if they get worse. Your condition will be monitored carefully while you are receiving this medication. You may need blood work while you are taking this medication. This medication may increase blood sugar. The risk may be higher in patients who already have diabetes. Ask your care team what you can do to lower your risk of diabetes while taking this medication. Talk to your care team if you wish to become pregnant or think you may be pregnant. This medication can cause serious birth defects. Do not breast-feed while taking this medication and for 6 months after stopping therapy. This medication may cause infertility. Talk to your care team if you are concerned about your fertility. What side effects may I notice from receiving this medication? Side effects that you should report to your care team as soon as possible: Allergic reactions--skin rash, itching, hives, swelling of the face, lips, tongue, or throat Gallbladder problems--severe stomach pain, nausea, vomiting, fever High blood sugar (hyperglycemia)--increased thirst or amount of urine, unusual weakness or fatigue, blurry vision Increase in blood pressure Low blood sugar (hypoglycemia)--tremors or shaking, anxiety, sweating, cold   or clammy skin, confusion, dizziness, rapid heartbeat Low thyroid levels (hypothyroidism)--unusual weakness or fatigue, increased sensitivity to cold,  constipation, hair loss, dry skin, weight gain, feelings of depression Slow heartbeat--dizziness, feeling faint or lightheaded, confusion, trouble breathing, unusual weakness or fatigue Side effects that usually do not require medical attention (report to your care team if they continue or are bothersome): Diarrhea Dizziness Headache Muscle spasms Nausea Pain, redness, irritation, or bruising at the injection site Stomach pain This list may not describe all possible side effects. Call your doctor for medical advice about side effects. You may report side effects to FDA at 1-800-FDA-1088. Where should I keep my medication? This medication is given in a hospital or clinic. It will not be stored at home. NOTE: This sheet is a summary. It may not cover all possible information. If you have questions about this medicine, talk to your doctor, pharmacist, or health care provider.  2024 Elsevier/Gold Standard (2021-06-19 00:00:00)  

## 2022-11-25 LAB — CHROMOGRANIN A: Chromogranin A (ng/mL): 89.6 ng/mL (ref 0.0–101.8)

## 2022-11-27 ENCOUNTER — Ambulatory Visit (HOSPITAL_BASED_OUTPATIENT_CLINIC_OR_DEPARTMENT_OTHER): Payer: BC Managed Care – PPO | Admitting: Physical Therapy

## 2022-11-27 ENCOUNTER — Encounter (HOSPITAL_BASED_OUTPATIENT_CLINIC_OR_DEPARTMENT_OTHER): Payer: Self-pay | Admitting: Physical Therapy

## 2022-11-27 DIAGNOSIS — E782 Mixed hyperlipidemia: Secondary | ICD-10-CM | POA: Diagnosis not present

## 2022-11-27 DIAGNOSIS — R2689 Other abnormalities of gait and mobility: Secondary | ICD-10-CM

## 2022-11-27 DIAGNOSIS — G8929 Other chronic pain: Secondary | ICD-10-CM | POA: Diagnosis not present

## 2022-11-27 DIAGNOSIS — F418 Other specified anxiety disorders: Secondary | ICD-10-CM | POA: Diagnosis not present

## 2022-11-27 DIAGNOSIS — M6281 Muscle weakness (generalized): Secondary | ICD-10-CM | POA: Diagnosis not present

## 2022-11-27 DIAGNOSIS — M25561 Pain in right knee: Secondary | ICD-10-CM | POA: Diagnosis not present

## 2022-11-27 DIAGNOSIS — E1169 Type 2 diabetes mellitus with other specified complication: Secondary | ICD-10-CM | POA: Diagnosis not present

## 2022-11-27 DIAGNOSIS — K760 Fatty (change of) liver, not elsewhere classified: Secondary | ICD-10-CM | POA: Diagnosis not present

## 2022-11-27 NOTE — Therapy (Addendum)
OUTPATIENT PHYSICAL THERAPY LOWER EXTREMITY TREATMENT PHYSICAL THERAPY DISCHARGE SUMMARY  Visits from Start of Care: 13  Current functional level related to goals / functional outcomes: unknown   Remaining deficits: unknown   Education / Equipment: Management of condition/HEP   Patient agrees to discharge. Patient goals were partially met. Patient is being discharged due to not returning since the last visit.  Addend Leah [name] 03/05/23 4:18 PM [facility] [address] [place], [place], [address] Phone: [phone]   Fax:  [phone]    Patient Name: Leah [name] MRN: [id number] DOB:1960/06/23, 62 y.o., female Today's Date: 11/27/2022  END OF SESSION:  PT End of Session - 11/27/22 0953     Visit Number 13    Number of Visits 16    Date for PT Re-Evaluation 12/20/22    PT Start Time 0948    PT Stop Time 1030    PT Time Calculation (min) 42 min    Activity Tolerance Patient tolerated treatment well    Behavior During Therapy [facility] for tasks assessed/performed                  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   Constipation    Diabetes (HCC)    Dyspnea    with exertion   Fatty liver    Gallbladder problem    GE reflux    Hepatic encephalopathy (HCC) 2011   History of PCR DNA positive for HSV1    History of retinal vein occlusion, right eye    HTN (hypertension) 2011   Joint pain    Liver problem    Neuroendocrine tumor 2011   Orthostatic hypotension    Osteoarthritis    Osteopenia    Palpitations    Sarcoidosis 2008   Sleep apnea    uses cpap   Surgical menopause    Past Surgical History:  Procedure Laterality Date   ABDOMINAL HYSTERECTOMY  2004   AUGMENTATION MAMMAPLASTY     BREAST BIOPSY Left     BREAST RECONSTRUCTION Bilateral 05/07/2020   Procedure: Revision of bilateral breast reconstruction;  Surgeon: Allena Napoleon, MD;  Location: [facility];  Service: Plastics;  Laterality: Bilateral;  2 hours   CESAREAN SECTION  [id number]   CHOLECYSTECTOMY  2006   HERNIA REPAIR     KNEE ARTHROSCOPY Left 09/2010   LAPAROSCOPIC HEPATECTOMY     LIPOSUCTION Bilateral 05/07/2020   Procedure: LIPOSUCTION with excision of lateral breast and axillary tissue;  Surgeon: Allena Napoleon, MD;  Location: [facility];  Service: Plastics;  Laterality: Bilateral;   LIVER SURGERY     MASTECTOMY Bilateral 2008   NASAL SINUS SURGERY     OOPHORECTOMY  2004   RECONSTRUCTION BREAST W/ LATISSIMUS DORSI FLAP Bilateral    TONSILLECTOMY  1976   TOTAL KNEE ARTHROPLASTY Left 10/29/2020   Procedure: TOTAL KNEE ARTHROPLASTY;  Surgeon: Ollen Gross, MD;  Location: [facility];  Service: Orthopedics;  Laterality: Left;   TOTAL KNEE ARTHROPLASTY Right 04/14/2022   Procedure: TOTAL KNEE ARTHROPLASTY;  Surgeon: Ollen Gross, MD;  Location: [facility];  Service: Orthopedics;  Laterality: Right;   TUBAL LIGATION Bilateral 2001   Patient Active Problem List   Diagnosis Date Noted  Primary osteoarthritis of right knee 04/14/2022   Primary osteoarthritis of left knee 10/29/2020   At risk for dehydration 07/04/2020   Depression 07/04/2020   Other fatigue 07/07/2019   Hepatic steatosis 01/03/2019   S/P right knee arthroscopy 10/19/2018   Hyperlipidemia associated with type 2 diabetes mellitus (HCC) 09/01/2018   Class 3 severe obesity with serious comorbidity and body mass index (BMI) of 45.0 to 49.9 in adult (HCC) 09/01/2018   OSA on CPAP 05/04/2018   Solitary pulmonary nodule 05/04/2018   History of prophylactic mastectomy of both breasts 11/04/2017   Preop examination 10/20/2017   Atrophic vaginitis 04/12/2016   Osteopenia 09/27/2015   Branch retinal vein occlusion of left eye 12/12/2014    Hypertensive retinopathy of both eyes 12/12/2014   Lung nodule seen on imaging study 05/01/2014   Shortness of breath on exertion    Type 2 diabetes mellitus with other specified complication (HCC) 03/17/2013   Morbid obesity (HCC) 03/01/2013   Carcinoid syndrome 02/21/2013   HSV infection 09/14/2012   Chronic pain of both knees 03/17/2012   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) 10/22/2010   Hypertension associated with type 2 diabetes mellitus (HCC) 10/22/2010   GERD (gastroesophageal reflux disease) 10/22/2010   Allergic rhinitis 10/22/2010   Sarcoidosis 10/22/2010   OA (osteoarthritis) of knee 10/22/2010   Anxiety 10/22/2010    PCP: Bary Leriche, PA-C   REFERRING PROVIDER: Allwardt, Crist Infante, PA-C   REFERRING DIAG: Osteoarthritis of right knee, unspecified osteoarthritis type   THERAPY DIAG:  Chronic pain of right knee  Muscle weakness (generalized)  Other abnormalities of gait and mobility  Rationale for Evaluation and Treatment: Rehabilitation  ONSET DATE: 2022  SUBJECTIVE:   SUBJECTIVE STATEMENT: The patient reports  no pain.  Feels like she has gotten stronger.  Cloimbing steps better.   Initial subjective: R TKR 2022, It now sounds like I am stepping on a bag of ruffles.  It is not as strong as it used to be.  Left knee 2024 March. Left knee is much better.   PERTINENT HISTORY: Pt PMH includes but is not limited to: HDL, DM II, OSA on CPAP, B mastectomy, carcinoid syndrome, metastatic neuroendocine ca, and L TKA (2022).  PAIN:  Are you having pain? No:  NPRS scale: current 0/10 Pain location:  Pain description:   Aggravating factors: walking distances no greater than 5 mins Relieving factors: elevating; sitting  PRECAUTIONS: Fall  RED FLAGS: None   WEIGHT BEARING RESTRICTIONS: No  FALLS:  Has patient fallen in last 6 months? No  LIVING ENVIRONMENT: Lives with: lives with their spouse Lives in: House/apartment Stairs:  Yes: External: 3 steps; can reach both Has following equipment at home: Single point cane, Walker - 2 wheeled, Tour manager, and Grab bars   PLOF: Independent  PATIENT GOALS: ease the crackle, strengthen, care of 4 yo grandson/care for.  NEXT MD VISIT: oncologist  OBJECTIVE:    PATIENT SURVEYS:  LEFS:28/80  COGNITION: Overall cognitive status: Within functional limits for tasks assessed     SENSATION: WFL  EDEMA:  none  MUSCLE LENGTH: Hamstrings:full   POSTURE: forward head  PALPATION: Left knee crepitus.  LOWER EXTREMITY ROM:  Active ROM Right eval Left eval  Hip flexion wfl wfl  Hip extension    Hip abduction    Hip adduction    Hip internal rotation    Hip external rotation    Knee flexion 125 125  Knee extension 0 0  Ankle dorsiflexion  Ankle plantarflexion    Ankle inversion    Ankle eversion     (Blank rows = not tested)  LOWER EXTREMITY MMT:  MMT Right eval Left eval    Hip flexion 49.1 50.9 34.7 31.8  Hip extension      Hip abduction 34.5 33.4 30.6 31.2  Hip adduction      Hip internal rotation      Hip external rotation      Knee flexion      Knee extension 30.7 34.2 32.1 33.6  Ankle dorsiflexion      Ankle plantarflexion      Ankle inversion      Ankle eversion       (Blank rows = not tested)   FUNCTIONAL TESTS:  5 times sit to stand: 13.80 Timed up and go (TUG): 11.31 4 stage:SLS x 5s 3 min walk test:477ft around pool.  10/22/22:  5x STS: 11.95s  10/12  3 min walk test 575 feet  GAIT: Distance walked: 400 Assistive device utilized: None Level of assistance: SBA Comments: Slowed cadence, shorted step length, decreased hip flex with swing   TODAY'S TREATMENT:                                                                                                                              11/27/22  Pt seen for aquatic therapy today.  Treatment took place in water 3.5-4.75 ft in depth at the [facility] pool. Temp  of water was 91.  Pt entered/exited the pool via stairs using step to pattern with hand rail.  *Without support: walking forward and backward , side stepping - 3 laps of each Exercises - Farmer's Walk holding hand float or noodle under water   - Side Stepping with Hand Floats - Leg Swings Side to Side - hold wall or noodle - Forward Backward Leg Swing - hold wall or noodle    - Sit to Stand   - Water Step Up on Bottom Step  - Squat - Tandem Stance  - Seated Straddle on Flotation Forward Breast Stroke Arms and Bicycle Legs  - Water Step Up on Bottom Step    10/12  Supine SLR 3x10  Bridge 3x10    Last visit:Pt seen for aquatic therapy today.  Treatment took place in water 3.5-4.75 ft in depth at the [facility] pool. Temp of water was 91.  Pt entered/exited the pool via stairs using step to pattern with hand rail.  *Without support: walking forward and backward , side stepping - 3 laps of each * unilateral and bilat farmer carry with yellow HB  at side, marching forward/ backward  *side lunge with UE add/abd yellow HB x 4 widths 6 inch step submerged ~50% - Step downs from bottom step x 8 R/L ue support hand rail (difficult)  - step ups 2 x 10 *add set using orange BB x 8 5 s hold; STS with add set 2x 5  4th step. *Cycling on noodle.   10/3 Saq 3x10  Seated clamshell 3x15 blue   Mini squats 3x10   Step up 2x10 bilateral  Lateral step up 2x10 bilateral  Step down 2x10 bilateral   All 4 inch      11/11/22 Pt seen for aquatic therapy today.  Treatment took place in water 3.5-4.75 ft in depth at the [facility] pool. Temp of water was 91.  Pt entered/exited the pool via stairs using step to pattern with hand rail.  *Without support: walking forward and backward , side stepping - 3 laps of each * unilateral farmer carry with yellow HB  at side, marching forward/ backward  *side lunge with UE add/abd yellow HB *forward lunges x 10 *backward lunges x  10 *squats bottom step x8; 2nd to bottom x 7; 3rd from bottom x5 *STS from 4th step x8;  *add set using orange BB x 8 5 s hold; STS with add set x 5 4th step. *tandem stance 75% submerged indep *SLS 75% then 50% submerged. After several tries pt successful holding x 10s  11/07/22 Nustep level 5 seat 3, 5 minutes for dynamic warm up Step up 4 inch 2 x 10 BLE Lateral step up 4 inch 2 x 10 BLE Lunge 2 x 10 bilateral  Tandem stance 3x 30 second holds bilateral Squat with UE support 2 x10     PATIENT EDUCATION:  Education details: aquatic therapy exercise rationale, progressions/ modifications  Person educated: Patient  Education method: Explanation Education comprehension: verbalized understanding  HOME EXERCISE PROGRAM: Pt following land based HEP given to her from last session of therapy ([facility])  Access Code: [email or web address] URL: [email or web address] Date: 10/30/2022 - Supine Quadricep Sets  - 2 x daily - 7 x weekly - 10 reps - 10 second holds hold - Active Straight Leg Raise with Quad Set (Mirrored)  - 2 x daily - 7 x weekly - 2 sets - 10 reps - Seated Long Arc Quad  - 2 x daily - 7 x weekly - 10 reps - 5-10 second holds hold - Sit to Stand with Arms Crossed  - 2 x daily - 7 x weekly - 2 sets - 10 reps  Aquatic Access Code:  Access Code: [email or web address] URL: [email or web address] Date: 11/18/2022 Prepared by: Geni Bers  Exercises - Farmer's Walk holding hand float or noodle under water  - 1-3 x weekly - Side Stepping with Hand Floats  - 1-3 x weekly - Leg Swings Side to Side - hold wall or noodle  - 3 x weekly - 1-2 sets - 10 reps - Forward Backward Leg Swing - hold wall or noodle   - 3 x weekly - 1-2 sets - 10 reps - Sit to Stand  - 1 x daily - 7 x weekly - 3 sets - 10 reps - Water Step Up on Bottom Step  - 1 x daily - 7 x weekly - 3 sets - 10 reps - Squat  - 1 x daily - 7 x weekly - 3 sets - 10 reps - Tandem Stance  - 1 x daily - 7 x weekly -  3 sets - 10 reps - Seated Straddle on Flotation Forward Breast Stroke Arms and Bicycle Legs  - 1 x daily - 7 x weekly - 3 sets - 10 reps Issued after laminated  ASSESSMENT:  CLINICAL IMPRESSION: Pt issued laminated final aquatic HEP. She is instructed through it requiring VC and demonstration. Completes without difficulty nor pain.  She demonstrates good execution.  Minor clarifications given and written on program.  She has reached her max potential in setting.  Will return to land based next session with pending DC when all goals met.  T      Initial Impression Patient is a 62 y.o. f who was seen today for physical therapy evaluation and treatment for right knee pain s/p TKR 2022. Has had a Left TKR March 2024.  Pt complains of left knee "cracking and popping" with some minimal pain associated. Crepitus palpated with left knee extension supports complaints. Her ROM bilat knees are good.  Strength in LE are symmetrical although weakness is noted throughout. She reports she is raising her 4yo grandson and needs to be able to get on floor with him as well as left objects she would not be lifting otherwise. She is battling metastatic liver cancer which may decrease her ability to tolerate therapy and slow her progression.  She states she does get winded quickly with walking up to 5 mins. She will benefit from skilled physical therapy intervention.  Will begin in aquatic setting to use the properties of water to progress strength and improve balance in low loaded and safe setting then will progress to land based for added strengthening, balance and gait for improvement in functional mobility and endurance.  OBJECTIVE IMPAIRMENTS: Abnormal gait, decreased balance, decreased endurance, difficulty walking, decreased strength, and pain.   ACTIVITY LIMITATIONS: lifting, bending, squatting, stairs, and locomotion level  PARTICIPATION LIMITATIONS: cleaning, shopping, community activity, and yard  work  PERSONAL FACTORS: Fitness, Past/current experiences, and 1 comorbidity: Liver ca  are also affecting patient's functional outcome.   REHAB POTENTIAL: Good  CLINICAL DECISION MAKING: Evolving/moderate complexity  EVALUATION COMPLEXITY: Moderate   GOALS: Goals reviewed with patient? Yes  SHORT TERM GOALS: Target date: Sept 27, 2024 Pt will tolerate full aquatic sessions consistently without increase in pain and with improving function to demonstrate good toleration and effectiveness of intervention.  Baseline: Goal status:10/28/22 Met  2.  Pt will improve on 5 X STS test to <or= 10  to demonstrate improving functional lower extremity strength, transitional movements, and balance Baseline: 13.80 at eval,  11.90 on 10/22/22 Goal status: In progress    3.  Pt will be able to complete SLS R/L x 20s on bottom step in pool to demonstrate improving balance Baseline: 5 s land Goal status: Met- 11/05/22  4. Pt will be instructed and indep with rising from floor.  Baseline: can do it but its hard  Goal: INITIAL    LONG TERM GOALS: Target date: Dec 01, 2022  Pt will improve on LEFS by at least 9 points to demonstrate statistically significant change in pt functional ability Baseline: 28/80 Goal status: INITIAL  2.  Walk through grocery store pushing cart to demonstrate improved toleration to amb Baseline: unable Goal status: In Progress 10/28/22  3.  3 MWT to improve to 649ft to demonstrate improved toleration to amb. Baseline: 400 Goal status: INITIAL  4.  PT will improve SLS up to or >15s Baseline: 5 Goal status: INITIAL  5.  5.  Pt will be indep with final HEP's (land and aquatic as appropriate) for continued management of condition Baseline:  Goal status: In progress (aquatic met) 11/27/22  6.  Pt will improve strength in all areas listed by at least 10lbs to demonstrate improved overall physical function Baseline: see chart Goal status: INITIAL   PLAN:  PT  FREQUENCY: 1-2x/week  PT DURATION: 8 weeks  12 visit.  Extended weeks to allow for scheduling conflicts  PLANNED INTERVENTIONS: Therapeutic exercises, Therapeutic activity, Neuromuscular re-education, Balance training, Gait training, Patient/Family education, Self Care, Joint mobilization, Joint manipulation, Stair training, Orthotic/Fit training, DME instructions, Aquatic Therapy, Dry Needling, Electrical stimulation, Cryotherapy, Moist heat, scar mobilization, Taping, Ultrasound, Ionotophoresis 4mg /ml Dexamethasone, Manual therapy, and Re-evaluation  PLAN FOR NEXT SESSION: Aquatics: LE and core strengthening; balance retraining; gait and endurance retraining Land: instruct on use of approp equipment (pt to join gym), le strengthening, floor transfers, body mechanics.   1:37 PM, 11/27/22 Rushie Chestnut) Lilian Coma [name] Physical Therapist at [facility] [address] [place], [place], [address] Phone: [phone]   Fax:  [phone]

## 2022-11-28 ENCOUNTER — Ambulatory Visit
Admission: RE | Admit: 2022-11-28 | Discharge: 2022-11-28 | Disposition: A | Discharge status: Home / Self Care | Payer: BC Managed Care – PPO | Source: Ambulatory Visit | Attending: Physician Assistant

## 2022-11-28 DIAGNOSIS — N644 Mastodynia: Secondary | ICD-10-CM

## 2022-11-28 DIAGNOSIS — N6325 Unspecified lump in the left breast, overlapping quadrants: Secondary | ICD-10-CM | POA: Diagnosis not present

## 2022-11-30 ENCOUNTER — Other Ambulatory Visit: Payer: Self-pay | Admitting: Physician Assistant

## 2022-11-30 DIAGNOSIS — I10 Essential (primary) hypertension: Secondary | ICD-10-CM

## 2022-12-09 ENCOUNTER — Other Ambulatory Visit: Payer: Self-pay | Admitting: Family Medicine

## 2022-12-16 ENCOUNTER — Other Ambulatory Visit: Payer: Self-pay | Admitting: Physician Assistant

## 2022-12-16 DIAGNOSIS — J301 Allergic rhinitis due to pollen: Secondary | ICD-10-CM

## 2022-12-16 DIAGNOSIS — F419 Anxiety disorder, unspecified: Secondary | ICD-10-CM

## 2022-12-19 ENCOUNTER — Other Ambulatory Visit: Payer: Self-pay | Admitting: Medical Genetics

## 2022-12-19 DIAGNOSIS — Z006 Encounter for examination for normal comparison and control in clinical research program: Secondary | ICD-10-CM

## 2022-12-22 ENCOUNTER — Encounter: Payer: Self-pay | Admitting: Hematology & Oncology

## 2022-12-22 DIAGNOSIS — E782 Mixed hyperlipidemia: Secondary | ICD-10-CM | POA: Diagnosis not present

## 2022-12-22 DIAGNOSIS — E66811 Obesity, class 1: Secondary | ICD-10-CM | POA: Diagnosis not present

## 2022-12-22 DIAGNOSIS — Z6833 Body mass index (BMI) 33.0-33.9, adult: Secondary | ICD-10-CM | POA: Diagnosis not present

## 2022-12-22 DIAGNOSIS — E1169 Type 2 diabetes mellitus with other specified complication: Secondary | ICD-10-CM | POA: Diagnosis not present

## 2022-12-26 ENCOUNTER — Inpatient Hospital Stay: Payer: BC Managed Care – PPO | Attending: Hematology & Oncology

## 2022-12-26 VITALS — BP 135/78 | HR 83 | Temp 98.0°F | Resp 17

## 2022-12-26 DIAGNOSIS — C7B8 Other secondary neuroendocrine tumors: Secondary | ICD-10-CM

## 2022-12-26 DIAGNOSIS — C7B02 Secondary carcinoid tumors of liver: Secondary | ICD-10-CM | POA: Diagnosis not present

## 2022-12-26 DIAGNOSIS — C7A00 Malignant carcinoid tumor of unspecified site: Secondary | ICD-10-CM | POA: Insufficient documentation

## 2022-12-26 MED ORDER — LANREOTIDE ACETATE 120 MG/0.5ML ~~LOC~~ SOLN
120.0000 mg | Freq: Once | SUBCUTANEOUS | Status: AC
  Administered 2022-12-26: 120 mg via SUBCUTANEOUS
  Filled 2022-12-26: qty 120

## 2022-12-26 NOTE — Patient Instructions (Signed)
Lanreotide Injection What is this medication? LANREOTIDE (lan REE oh tide) treats high levels of growth hormone (acromegaly). It is used when other therapies have not worked well enough or cannot be tolerated. It works by reducing the amount of growth hormone your body makes. This reduces symptoms and the risk of health problems caused by too much growth hormone, such as diabetes and heart disease. It may also be used to treat neuroendocrine tumors, a cancer of the cells that release hormones and other substances in your body. It works by slowing down the release of these substances from the cells. This slows tumor growth. It also decreases the symptoms of carcinoid syndrome, such as flushing or diarrhea. This medicine may be used for other purposes; ask your health care provider or pharmacist if you have questions. COMMON BRAND NAME(S): Somatuline [place] What should I tell my care team before I take this medication? They need to know if you have any of these conditions: Diabetes Gallbladder disease Heart disease Kidney disease Liver disease Thyroid disease An unusual or allergic reaction to lanreotide, other medications, foods, dyes, or preservatives Pregnant or trying to get pregnant Breast-feeding How should I use this medication? This medication is injected under the skin. It is given by your care team in a hospital or clinic setting. Talk to your care team about the use of this medication in children. Special care may be needed. Overdosage: If you think you have taken too much of this medicine contact a poison control center or emergency room at once. NOTE: This medicine is only for you. Do not share this medicine with others. What if I miss a dose? Keep appointments for follow-up doses. It is important not to miss your dose. Call your care team if you are unable to keep an appointment. What may interact with this medication? Bromocriptine Cyclosporine Certain medications for blood  pressure, heart disease, irregular heartbeat Certain medications for diabetes Quinidine Terfenadine This list may not describe all possible interactions. Give your health care provider a list of all the medicines, herbs, non-prescription drugs, or dietary supplements you use. Also tell them if you smoke, drink alcohol, or use illegal drugs. Some items may interact with your medicine. What should I watch for while using this medication? Visit your care team for regular checks on your progress. Tell your care team if your symptoms do not start to get better or if they get worse. Your condition will be monitored carefully while you are receiving this medication. You may need blood work while you are taking this medication. This medication may increase blood sugar. The risk may be higher in patients who already have diabetes. Ask your care team what you can do to lower your risk of diabetes while taking this medication. Talk to your care team if you wish to become pregnant or think you may be pregnant. This medication can cause serious birth defects. Do not breast-feed while taking this medication and for 6 months after stopping therapy. This medication may cause infertility. Talk to your care team if you are concerned about your fertility. What side effects may I notice from receiving this medication? Side effects that you should report to your care team as soon as possible: Allergic reactions--skin rash, itching, hives, swelling of the face, lips, tongue, or throat Gallbladder problems--severe stomach pain, nausea, vomiting, fever High blood sugar (hyperglycemia)--increased thirst or amount of urine, unusual weakness or fatigue, blurry vision Increase in blood pressure Low blood sugar (hypoglycemia)--tremors or shaking, anxiety, sweating, cold   or clammy skin, confusion, dizziness, rapid heartbeat Low thyroid levels (hypothyroidism)--unusual weakness or fatigue, increased sensitivity to cold,  constipation, hair loss, dry skin, weight gain, feelings of depression Slow heartbeat--dizziness, feeling faint or lightheaded, confusion, trouble breathing, unusual weakness or fatigue Side effects that usually do not require medical attention (report to your care team if they continue or are bothersome): Diarrhea Dizziness Headache Muscle spasms Nausea Pain, redness, irritation, or bruising at the injection site Stomach pain This list may not describe all possible side effects. Call your doctor for medical advice about side effects. You may report side effects to FDA at 1-800-FDA-1088. Where should I keep my medication? This medication is given in a hospital or clinic. It will not be stored at home. NOTE: This sheet is a summary. It may not cover all possible information. If you have questions about this medicine, talk to your doctor, pharmacist, or health care provider.  2024 Elsevier/Gold Standard (2021-06-19 00:00:00)  

## 2023-01-06 DIAGNOSIS — K869 Disease of pancreas, unspecified: Secondary | ICD-10-CM | POA: Diagnosis not present

## 2023-01-06 DIAGNOSIS — C7A8 Other malignant neuroendocrine tumors: Secondary | ICD-10-CM | POA: Diagnosis not present

## 2023-01-06 DIAGNOSIS — C787 Secondary malignant neoplasm of liver and intrahepatic bile duct: Secondary | ICD-10-CM | POA: Diagnosis not present

## 2023-01-09 ENCOUNTER — Other Ambulatory Visit: Payer: Self-pay | Admitting: Physician Assistant

## 2023-01-12 DIAGNOSIS — C787 Secondary malignant neoplasm of liver and intrahepatic bile duct: Secondary | ICD-10-CM | POA: Diagnosis not present

## 2023-01-12 DIAGNOSIS — D3A8 Other benign neuroendocrine tumors: Secondary | ICD-10-CM | POA: Diagnosis not present

## 2023-01-13 DIAGNOSIS — E119 Type 2 diabetes mellitus without complications: Secondary | ICD-10-CM | POA: Diagnosis not present

## 2023-01-14 ENCOUNTER — Other Ambulatory Visit (HOSPITAL_COMMUNITY)
Admission: RE | Admit: 2023-01-14 | Discharge: 2023-01-14 | Disposition: A | Discharge status: Home / Self Care | Payer: Self-pay | Source: Ambulatory Visit | Attending: Oncology | Admitting: Oncology

## 2023-01-14 DIAGNOSIS — Z006 Encounter for examination for normal comparison and control in clinical research program: Secondary | ICD-10-CM | POA: Insufficient documentation

## 2023-01-20 DIAGNOSIS — K760 Fatty (change of) liver, not elsewhere classified: Secondary | ICD-10-CM | POA: Diagnosis not present

## 2023-01-20 DIAGNOSIS — E1169 Type 2 diabetes mellitus with other specified complication: Secondary | ICD-10-CM | POA: Diagnosis not present

## 2023-01-20 DIAGNOSIS — E782 Mixed hyperlipidemia: Secondary | ICD-10-CM | POA: Diagnosis not present

## 2023-01-20 DIAGNOSIS — Z9189 Other specified personal risk factors, not elsewhere classified: Secondary | ICD-10-CM | POA: Diagnosis not present

## 2023-01-20 DIAGNOSIS — E66811 Obesity, class 1: Secondary | ICD-10-CM | POA: Diagnosis not present

## 2023-01-23 ENCOUNTER — Inpatient Hospital Stay: Payer: BC Managed Care – PPO | Attending: Hematology & Oncology

## 2023-01-23 VITALS — BP 129/71 | HR 80 | Temp 98.7°F | Resp 17

## 2023-01-23 DIAGNOSIS — C7B02 Secondary carcinoid tumors of liver: Secondary | ICD-10-CM | POA: Insufficient documentation

## 2023-01-23 DIAGNOSIS — C7A00 Malignant carcinoid tumor of unspecified site: Secondary | ICD-10-CM | POA: Insufficient documentation

## 2023-01-23 DIAGNOSIS — C7B8 Other secondary neuroendocrine tumors: Secondary | ICD-10-CM

## 2023-01-23 MED ORDER — LANREOTIDE ACETATE 120 MG/0.5ML ~~LOC~~ SOLN
120.0000 mg | Freq: Once | SUBCUTANEOUS | Status: AC
  Administered 2023-01-23: 120 mg via SUBCUTANEOUS
  Filled 2023-01-23: qty 120

## 2023-01-27 DIAGNOSIS — D3A8 Other benign neuroendocrine tumors: Secondary | ICD-10-CM | POA: Diagnosis not present

## 2023-01-27 LAB — GENECONNECT MOLECULAR SCREEN: Genetic Analysis Overall Interpretation: NEGATIVE

## 2023-02-07 ENCOUNTER — Other Ambulatory Visit: Payer: Self-pay | Admitting: Physician Assistant

## 2023-02-10 DIAGNOSIS — E1169 Type 2 diabetes mellitus with other specified complication: Secondary | ICD-10-CM | POA: Diagnosis not present

## 2023-02-10 DIAGNOSIS — E1159 Type 2 diabetes mellitus with other circulatory complications: Secondary | ICD-10-CM | POA: Diagnosis not present

## 2023-02-10 DIAGNOSIS — Z6834 Body mass index (BMI) 34.0-34.9, adult: Secondary | ICD-10-CM | POA: Diagnosis not present

## 2023-02-19 ENCOUNTER — Other Ambulatory Visit: Payer: Self-pay | Admitting: *Deleted

## 2023-02-19 ENCOUNTER — Encounter: Payer: Self-pay | Admitting: Physician Assistant

## 2023-02-19 DIAGNOSIS — C7B8 Other secondary neuroendocrine tumors: Secondary | ICD-10-CM

## 2023-02-20 ENCOUNTER — Other Ambulatory Visit: Payer: Self-pay | Admitting: Hematology & Oncology

## 2023-02-20 ENCOUNTER — Inpatient Hospital Stay: Payer: BC Managed Care – PPO

## 2023-02-20 ENCOUNTER — Encounter: Payer: Self-pay | Admitting: Hematology & Oncology

## 2023-02-20 ENCOUNTER — Inpatient Hospital Stay (HOSPITAL_BASED_OUTPATIENT_CLINIC_OR_DEPARTMENT_OTHER): Payer: BC Managed Care – PPO | Admitting: Hematology & Oncology

## 2023-02-20 ENCOUNTER — Inpatient Hospital Stay: Payer: BC Managed Care – PPO | Attending: Hematology & Oncology

## 2023-02-20 ENCOUNTER — Other Ambulatory Visit: Payer: Self-pay | Admitting: Physician Assistant

## 2023-02-20 VITALS — BP 137/74 | HR 81 | Temp 98.0°F | Resp 20 | Ht 59.0 in | Wt 170.1 lb

## 2023-02-20 DIAGNOSIS — R978 Other abnormal tumor markers: Secondary | ICD-10-CM | POA: Insufficient documentation

## 2023-02-20 DIAGNOSIS — C7B8 Other secondary neuroendocrine tumors: Secondary | ICD-10-CM

## 2023-02-20 DIAGNOSIS — C7B02 Secondary carcinoid tumors of liver: Secondary | ICD-10-CM | POA: Insufficient documentation

## 2023-02-20 DIAGNOSIS — C7A00 Malignant carcinoid tumor of unspecified site: Secondary | ICD-10-CM | POA: Insufficient documentation

## 2023-02-20 LAB — CMP (CANCER CENTER ONLY)
ALT: 13 U/L (ref 0–44)
AST: 18 U/L (ref 15–41)
Albumin: 3.9 g/dL (ref 3.5–5.0)
Alkaline Phosphatase: 69 U/L (ref 38–126)
Anion gap: 10 (ref 5–15)
BUN: 17 mg/dL (ref 8–23)
CO2: 24 mmol/L (ref 22–32)
Calcium: 9.2 mg/dL (ref 8.9–10.3)
Chloride: 105 mmol/L (ref 98–111)
Creatinine: 0.75 mg/dL (ref 0.44–1.00)
GFR, Estimated: >60 mL/min (ref >60)
Glucose, Bld: 102 mg/dL — ABNORMAL HIGH (ref 70–99)
Potassium: 4 mmol/L (ref 3.5–5.1)
Sodium: 139 mmol/L (ref 135–145)
Total Bilirubin: 0.2 mg/dL (ref 0.0–1.2)
Total Protein: 7.9 g/dL (ref 6.5–8.1)

## 2023-02-20 LAB — CBC WITH DIFFERENTIAL (CANCER CENTER ONLY)
Abs Immature Granulocytes: 0.05 10*3/uL (ref 0.00–0.07)
Basophils Absolute: 0.1 10*3/uL (ref 0.0–0.1)
Basophils Relative: 1 %
Eosinophils Absolute: 0.2 10*3/uL (ref 0.0–0.5)
Eosinophils Relative: 2 %
HCT: 39.3 % (ref 36.0–46.0)
Hemoglobin: 12.7 g/dL (ref 12.0–15.0)
Immature Granulocytes: 1 %
Lymphocytes Relative: 31 %
Lymphs Abs: 3 10*3/uL (ref 0.7–4.0)
MCH: 29.3 pg (ref 26.0–34.0)
MCHC: 32.3 g/dL (ref 30.0–36.0)
MCV: 90.6 fL (ref 80.0–100.0)
Monocytes Absolute: 0.6 10*3/uL (ref 0.1–1.0)
Monocytes Relative: 6 %
Neutro Abs: 5.7 10*3/uL (ref 1.7–7.7)
Neutrophils Relative %: 59 %
Platelet Count: 307 10*3/uL (ref 150–400)
RBC: 4.34 MIL/uL (ref 3.87–5.11)
RDW: 13.5 % (ref 11.5–15.5)
WBC Count: 9.5 10*3/uL (ref 4.0–10.5)
nRBC: 0 % (ref 0.0–0.2)

## 2023-02-20 LAB — LACTATE DEHYDROGENASE: LDH: 118 U/L (ref 98–192)

## 2023-02-20 MED ORDER — LANREOTIDE ACETATE 120 MG/0.5ML ~~LOC~~ SOLN
120.0000 mg | Freq: Once | SUBCUTANEOUS | Status: AC
  Administered 2023-02-20: 120 mg via SUBCUTANEOUS
  Filled 2023-02-20: qty 120

## 2023-02-20 NOTE — Patient Instructions (Signed)
 Lanreotide Injection What is this medication? LANREOTIDE (lan REE oh tide) treats high levels of growth hormone (acromegaly). It is used when other therapies have not worked well enough or cannot be tolerated. It works by reducing the amount of growth hormone your body makes. This reduces symptoms and the risk of health problems caused by too much growth hormone, such as diabetes and heart disease. It may also be used to treat neuroendocrine tumors, a cancer of the cells that release hormones and other substances in your body. It works by slowing down the release of these substances from the cells. This slows tumor growth. It also decreases the symptoms of carcinoid syndrome, such as flushing or diarrhea. This medicine may be used for other purposes; ask your health care provider or pharmacist if you have questions. COMMON BRAND NAME(S): Somatuline Depot What should I tell my care team before I take this medication? They need to know if you have any of these conditions: Diabetes Gallbladder disease Heart disease Kidney disease Liver disease Thyroid disease An unusual or allergic reaction to lanreotide, other medications, foods, dyes, or preservatives Pregnant or trying to get pregnant Breast-feeding How should I use this medication? This medication is injected under the skin. It is given by your care team in a hospital or clinic setting. Talk to your care team about the use of this medication in children. Special care may be needed. Overdosage: If you think you have taken too much of this medicine contact a poison control center or emergency room at once. NOTE: This medicine is only for you. Do not share this medicine with others. What if I miss a dose? Keep appointments for follow-up doses. It is important not to miss your dose. Call your care team if you are unable to keep an appointment. What may interact with this medication? Bromocriptine Cyclosporine Certain medications for blood  pressure, heart disease, irregular heartbeat Certain medications for diabetes Quinidine Terfenadine This list may not describe all possible interactions. Give your health care provider a list of all the medicines, herbs, non-prescription drugs, or dietary supplements you use. Also tell them if you smoke, drink alcohol, or use illegal drugs. Some items may interact with your medicine. What should I watch for while using this medication? Visit your care team for regular checks on your progress. Tell your care team if your symptoms do not start to get better or if they get worse. Your condition will be monitored carefully while you are receiving this medication. You may need blood work while you are taking this medication. This medication may increase blood sugar. The risk may be higher in patients who already have diabetes. Ask your care team what you can do to lower your risk of diabetes while taking this medication. Talk to your care team if you wish to become pregnant or think you may be pregnant. This medication can cause serious birth defects. Do not breast-feed while taking this medication and for 6 months after stopping therapy. This medication may cause infertility. Talk to your care team if you are concerned about your fertility. What side effects may I notice from receiving this medication? Side effects that you should report to your care team as soon as possible: Allergic reactions--skin rash, itching, hives, swelling of the face, lips, tongue, or throat Gallbladder problems--severe stomach pain, nausea, vomiting, fever High blood sugar (hyperglycemia)--increased thirst or amount of urine, unusual weakness or fatigue, blurry vision Increase in blood pressure Low blood sugar (hypoglycemia)--tremors or shaking, anxiety, sweating, cold  or clammy skin, confusion, dizziness, rapid heartbeat Low thyroid levels (hypothyroidism)--unusual weakness or fatigue, increased sensitivity to cold,  constipation, hair loss, dry skin, weight gain, feelings of depression Slow heartbeat--dizziness, feeling faint or lightheaded, confusion, trouble breathing, unusual weakness or fatigue Side effects that usually do not require medical attention (report to your care team if they continue or are bothersome): Diarrhea Dizziness Headache Muscle spasms Nausea Pain, redness, irritation, or bruising at the injection site Stomach pain This list may not describe all possible side effects. Call your doctor for medical advice about side effects. You may report side effects to FDA at 1-800-FDA-1088. Where should I keep my medication? This medication is given in a hospital or clinic. It will not be stored at home. NOTE: This sheet is a summary. It may not cover all possible information. If you have questions about this medicine, talk to your doctor, pharmacist, or health care provider.  2024 Elsevier/Gold Standard (2021-06-19 00:00:00)

## 2023-02-20 NOTE — Progress Notes (Addendum)
 Hematology and Oncology Follow Up Visit  Leah [name] [id number] 1960/02/18 63 y.o. 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  Unfortunately, looks like she is progressing.  She had a MRI of the abdomen on 01/07/2023.  This showed that she had increase in hepatic lesions.  Also noted were some new subcentimeter areas within the left hepatic lobe.  Also noted was a 3 mm lesion within the pancreatic body.  Her last chromogranin A was 90.  I think that this might be a good indicator that she might consider Lutathera  injections.  We will have to see back in her set up for the evaluation.  She has had no diarrhea.  In fact, she has had a little bit of constipation.  There is been some illness in the family.  Her mother-in-law, who we do take care of, had a heart attack.  Thankfully, she did not succumb to this.  Ms. Leah [name] and her husband are also taking care of a 82-year-old grandson now.  She has had no fever.  She has had some hot flashes.  She has had no cough.  There is been no problems with COVID.  Overall, I would say that her performance status is probably ECOG 1.   Wt Readings from Last 3 Encounters:  02/20/23 170 lb 1.3 oz (77.1 kg)  11/24/22 167 lb 1.9 oz (75.8 kg)  10/21/22 173 lb (78.5 kg)   Medications:  Allergies as of 02/20/2023   No Known Allergies      Medication List        Accurate as of February 20, 2023 11:39 AM. If you have any questions, ask your nurse or doctor.          albuterol  108 (90 Base) MCG/ACT inhaler Commonly known as: VENTOLIN  HFA Inhale 2 puffs into the lungs every 6 (six) hours as needed for wheezing or shortness of breath.   amLODipine  10 MG tablet Commonly known as: NORVASC  TAKE 1 TABLET BY  MOUTH EVERY DAY   clonazePAM  0.5 MG tablet Commonly known as: KLONOPIN  TAKE 1 TABLET BY MOUTH 2 TIMES DAILY.   docusate sodium  100 MG capsule Commonly known as: COLACE TAKE 1 CAPSULE BY MOUTH TWICE A DAY   fexofenadine  180 MG tablet Commonly known as: ALLEGRA  TAKE 1 TABLET BY MOUTH EVERY DAY   gabapentin  100 MG capsule Commonly known as: NEURONTIN  Take 1 capsule (100 mg total) by mouth at bedtime.   metFORMIN  500 MG tablet Commonly known as: GLUCOPHAGE  Take 1 tablet (500 mg total) by mouth 2 (two) times daily with a meal.   methocarbamol  500 MG tablet Commonly known as: ROBAXIN  Take 1 tablet (500 mg total) by mouth every 6 (six) hours as needed for muscle spasms.   metoprolol  succinate 100 MG 24 hr tablet Commonly known as: TOPROL -XL TAKE 1 TABLET BY MOUTH EVERY DAY EVERY EVENING WITH OR IMMEDIATELY FOLLOWING A MEAL   montelukast  10 MG tablet Commonly known as: SINGULAIR  TAKE 1 TABLET BY MOUTH EVERYDAY AT BEDTIME   Mounjaro  2.5 MG/0.5ML Pen Generic drug: tirzepatide  Inject 2.5 mg into the skin once a week.   ondansetron  4 MG disintegrating tablet Commonly known as: ZOFRAN -ODT TAKE 1 TABLET BY MOUTH EVERY 8 HOURS AS NEEDED *INS MAX 18 TABS PER 21 DAYS*  pantoprazole  40 MG tablet Commonly known as: PROTONIX  TAKE 1 TABLET BY MOUTH TWICE A DAY   rosuvastatin  10 MG tablet Commonly known as: CRESTOR  TAKE 1 TABLET BY MOUTH EVERY DAY   SANDOSTATIN  IJ Inject 1 Dose as directed every 30 (thirty) days.   scopolamine  1 MG/3DAYS Commonly known as: TRANSDERM-SCOP Place 1 patch (1.5 mg total) onto the skin every 3 (three) days.   TURMERIC PO Take 1 capsule by mouth daily.   valACYclovir  500 MG tablet Commonly known as: VALTREX  Take 1 tablet by mouth daily.   valsartan  320 MG tablet Commonly known as: DIOVAN  TAKE 1 TABLET BY MOUTH EVERY DAY   venlafaxine  XR 150 MG 24 hr capsule Commonly known as: EFFEXOR -XR TAKE 1 CAPSULE BY MOUTH EVERY DAY IN THE MORNING         Allergies: No Known Allergies  Past Medical History, Surgical history, Social history, and Family History were reviewed and updated.  Review of Systems: Review of Systems  Constitutional: Negative.   HENT: Negative.    Eyes: Negative.   Respiratory: Negative.    Cardiovascular: Negative.   Gastrointestinal: Negative.   Genitourinary: Negative.   Musculoskeletal: Negative.   Skin: Negative.   Neurological: Negative.   Endo/Heme/Allergies: Negative.   Psychiatric/Behavioral: Negative.     Physical Exam:  height is 4' 11 (1.499 m) and weight is 170 lb 1.3 oz (77.1 kg). Her oral temperature is 98 F (36.7 C). Her blood pressure is 137/74 and her pulse is 81. Her respiration is 20 and oxygen  saturation is 100%.   Wt Readings from Last 3 Encounters:  02/20/23 170 lb 1.3 oz (77.1 kg)  11/24/22 167 lb 1.9 oz (75.8 kg)  10/21/22 173 lb (78.5 kg)    Physical Exam Vitals reviewed.  HENT:     Head: Normocephalic and atraumatic.  Eyes:     Pupils: Pupils are equal, round, and reactive to light.  Cardiovascular:     Rate and Rhythm: Normal rate and regular rhythm.     Heart sounds: Normal heart sounds.  Pulmonary:     Effort: Pulmonary effort is normal.     Breath sounds: Normal breath sounds.  Abdominal:     General: Bowel sounds are normal.     Palpations: Abdomen is soft.     Comments: Abdominal exam shows an obese abdomen that is soft.  She has good bowel sounds.  There is no fluid wave. There is no tenderness.  There is no guarding or rebound.  There is no palpable liver or spleen tip.  Musculoskeletal:        General: No tenderness or deformity.     Cervical back: Normal range of motion.  Lymphadenopathy:     Cervical: No cervical adenopathy.  Skin:    General: Skin is warm and dry.     Findings: No erythema or rash.  Neurological:     Mental Status: She is alert and oriented to person, place, and time.  Psychiatric:        Behavior: Behavior normal.         Thought Content: Thought content normal.        Judgment: Judgment normal.      Lab Results  Component Value Date   WBC 9.5 02/20/2023   HGB 12.7 02/20/2023   HCT 39.3 02/20/2023   MCV 90.6 02/20/2023   PLT 307 02/20/2023   Lab Results  Component Value Date   FERRITIN 382 (H) 10/18/2008   IRON <10 (L) 10/18/2008  TIBC NOT CALC Not calculated due to Iron <10. 10/18/2008   UIBC 156 10/18/2008   IRONPCTSAT NOT CALC Not calculated due to Iron <10. 10/18/2008   Lab Results  Component Value Date   RETICCTPCT 0.9 10/18/2008   RBC 4.34 02/20/2023   No results found for: KPAFRELGTCHN, LAMBDASER, KAPLAMBRATIO No results found for: IGGSERUM, IGA, IGMSERUM No results found for: STEPHANY CARLOTA BENSON MARKEL EARLA JOANNIE DOC VICK, SPEI   Chemistry      Component Value Date/Time   NA 139 11/24/2022 1029   NA 140 01/20/2017 1416   NA 141 01/17/2016 1047   K 3.9 11/24/2022 1029   K 3.9 01/20/2017 1416   K 3.2 (L) 01/17/2016 1047   CL 104 11/24/2022 1029   CL 102 01/20/2017 1416   CO2 26 11/24/2022 1029   CO2 26 01/20/2017 1416   CO2 27 01/17/2016 1047   BUN 13 11/24/2022 1029   BUN 15 01/20/2017 1416   BUN 6.6 (L) 01/17/2016 1047   CREATININE 0.72 11/24/2022 1029   CREATININE 1.1 01/20/2017 1416   CREATININE 0.7 01/17/2016 1047      Component Value Date/Time   CALCIUM  9.1 11/24/2022 1029   CALCIUM  9.1 01/20/2017 1416   CALCIUM  8.7 01/17/2016 1047   ALKPHOS 72 11/24/2022 1029   ALKPHOS 84 01/20/2017 1416   ALKPHOS 90 01/17/2016 1047   AST 13 (L) 11/24/2022 1029   AST 34 01/17/2016 1047   ALT 10 11/24/2022 1029   ALT 35 01/20/2017 1416   ALT 38 01/17/2016 1047   BILITOT 0.3 11/24/2022 1029   BILITOT 0.42 01/17/2016 1047       Impression and Plan: Ms. Santee is a very pleasant 63 yo caucasian female with long standing history of metastatic carcinoid with recurrent liver metastasis.   Again, it looks that she is  progressing by her last MRI.  We will see about Lutathera .  I think this would be a very good idea for her if her tumors take up the tracer.  We will set her up with a Lutathera  scan.  If everything is positive, then we will see if Nuclear Medicine would be able to administer therapeutic Lutathera .  She will get her Somatuline today.    Maude JONELLE Crease, MD 1/10/202511:39 AM  ADDENDUM: We did the the dotatate PET scan.  This did show activity in the liver.  I spoke with Ms. Asato.  I think that Lutathera  would be a great idea for her.  She has not had this before.  I put the referral in for him to IR for them to evaluate her for Lutathera  treatment.  She is in agreement to see about having this done.   Jeralyn Crease, MD

## 2023-02-24 LAB — CHROMOGRANIN A: Chromogranin A (ng/mL): 111.7 ng/mL — ABNORMAL HIGH (ref 0.0–101.8)

## 2023-02-26 ENCOUNTER — Other Ambulatory Visit (HOSPITAL_COMMUNITY): Payer: Self-pay | Admitting: Hematology & Oncology

## 2023-02-26 ENCOUNTER — Encounter (HOSPITAL_COMMUNITY)
Admission: RE | Admit: 2023-02-26 | Discharge: 2023-02-26 | Disposition: A | Discharge status: Home / Self Care | Payer: BC Managed Care – PPO | Source: Ambulatory Visit | Attending: Hematology & Oncology | Admitting: Hematology & Oncology

## 2023-02-26 DIAGNOSIS — C7B8 Other secondary neuroendocrine tumors: Secondary | ICD-10-CM | POA: Insufficient documentation

## 2023-02-26 DIAGNOSIS — C7B02 Secondary carcinoid tumors of liver: Secondary | ICD-10-CM

## 2023-02-26 NOTE — Consult Note (Signed)
Chief Complaint: Patient with metastatic neuroendocrine tumor was for  evaluation Peptide receptor radiotherapy (PRRT) with ZO109 DOTATATE (Lutathera).  Referring Physician(s):Ennever  L    Patient Status: [facility] - Out-pt  History of Present Illness: Leah [name] is a 63 y.o. female long standing neuroendocrine tumor with liver metastasis.   Original diagnosis in 2011.  On lanreotide with control of carcinoid symptoms.   Elarging liver lesions on outside MR.  Increasing Chromogranin.  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   Constipation    Diabetes (HCC)    Dyspnea    with exertion   Fatty liver    Gallbladder problem    GE reflux    Hepatic encephalopathy (HCC) 2011   History of PCR DNA positive for HSV1    History of retinal vein occlusion, right eye    HTN (hypertension) 2011   Joint pain    Liver problem    Neuroendocrine tumor 2011   Orthostatic hypotension    Osteoarthritis    Osteopenia    Palpitations    Sarcoidosis 2008   Sleep apnea    uses cpap   Surgical menopause     Past Surgical History:  Procedure Laterality Date   ABDOMINAL HYSTERECTOMY  2004   AUGMENTATION MAMMAPLASTY     BREAST BIOPSY Left    BREAST RECONSTRUCTION Bilateral 05/07/2020   Procedure: Revision of bilateral breast reconstruction;  Surgeon: Allena Napoleon, MD;  Location: [facility];  Service: Plastics;  Laterality: Bilateral;  2 hours   CESAREAN SECTION  6045,4098   CHOLECYSTECTOMY  2006   HERNIA REPAIR     KNEE ARTHROSCOPY Left 09/2010   LAPAROSCOPIC HEPATECTOMY     LIPOSUCTION Bilateral 05/07/2020   Procedure: LIPOSUCTION with excision of lateral breast and axillary tissue;  Surgeon: Allena Napoleon, MD;  Location: [facility];  Service: Plastics;  Laterality: Bilateral;    LIVER SURGERY     MASTECTOMY Bilateral 2008   NASAL SINUS SURGERY     OOPHORECTOMY  2004   RECONSTRUCTION BREAST W/ LATISSIMUS DORSI FLAP Bilateral    TONSILLECTOMY  1976   TOTAL KNEE ARTHROPLASTY Left 10/29/2020   Procedure: TOTAL KNEE ARTHROPLASTY;  Surgeon: Ollen Gross, MD;  Location: [facility];  Service: Orthopedics;  Laterality: Left;   TOTAL KNEE ARTHROPLASTY Right 04/14/2022   Procedure: TOTAL KNEE ARTHROPLASTY;  Surgeon: Ollen Gross, MD;  Location: [facility];  Service: Orthopedics;  Laterality: Right;   TUBAL LIGATION Bilateral 2001    Allergies: Patient has no known allergies.  Medications: Prior to Admission medications   Medication Sig Start Date End Date Taking? Authorizing Provider  albuterol (VENTOLIN HFA) 108 (90 Base) MCG/ACT inhaler Inhale 2 puffs into the lungs every 6 (six) hours as needed for wheezing or shortness of breath. 02/10/22   Waldon Merl, PA-C  amLODipine (NORVASC) 10 MG tablet TAKE 1 TABLET BY MOUTH EVERY DAY 09/16/22   Allwardt, Alyssa M, PA-C  clonazePAM (KLONOPIN) 0.5 MG tablet TAKE 1 TABLET BY MOUTH 2 TIMES DAILY. 05/27/21   Allwardt, Crist Infante, PA-C  docusate sodium (COLACE) 100 MG capsule TAKE 1 CAPSULE BY MOUTH TWICE A DAY 11/04/22   Allwardt, Alyssa M, PA-C  fexofenadine (ALLEGRA) 180 MG tablet TAKE 1 TABLET BY MOUTH EVERY  DAY 12/16/22   Allwardt, Crist Infante, PA-C  gabapentin (NEURONTIN) 100 MG capsule Take 1 capsule (100 mg total) by mouth at bedtime. 10/21/22 01/19/23  Allwardt, Crist Infante, PA-C  metFORMIN (GLUCOPHAGE) 500 MG tablet Take 1 tablet (500 mg total) by mouth 2 (two) times daily with a meal. 09/11/20   Helane Rima, DO  methocarbamol (ROBAXIN) 500 MG tablet Take 1 tablet (500 mg total) by mouth every 6 (six) hours as needed for muscle spasms. Patient not taking: Reported on 02/20/2023 04/15/22   Eartha Inch, PA  metoprolol succinate (TOPROL-XL) 100 MG 24 hr tablet TAKE 1 TABLET BY MOUTH EVERY DAY EVERY EVENING WITH OR IMMEDIATELY FOLLOWING A  MEAL 09/03/22   Allwardt, Alyssa M, PA-C  montelukast (SINGULAIR) 10 MG tablet TAKE 1 TABLET BY MOUTH EVERYDAY AT BEDTIME 11/14/22   Allwardt, Alyssa M, PA-C  MOUNJARO 2.5 MG/0.5ML Pen Inject 2.5 mg into the skin once a week.    [provider]  Octreotide Acetate (SANDOSTATIN IJ) Inject 1 Dose as directed every 30 (thirty) days.    [provider]  ondansetron (ZOFRAN-ODT) 4 MG disintegrating tablet TAKE 1 TABLET BY MOUTH EVERY 8 HOURS AS NEEDED *INS MAX 18 TABS PER 21 DAYS* 02/09/23   Allwardt, Alyssa M, PA-C  pantoprazole (PROTONIX) 40 MG tablet TAKE 1 TABLET BY MOUTH TWICE A DAY 12/09/22   Allwardt, Alyssa M, PA-C  rosuvastatin (CRESTOR) 10 MG tablet TAKE 1 TABLET BY MOUTH EVERY DAY 12/09/22   Allwardt, Alyssa M, PA-C  scopolamine (TRANSDERM-SCOP) 1 MG/3DAYS Place 1 patch (1.5 mg total) onto the skin every 3 (three) days. 06/30/22   Allwardt, Crist Infante, PA-C  TURMERIC PO Take 1 capsule by mouth daily. Patient not taking: Reported on 02/20/2023    [provider]  valACYclovir (VALTREX) 500 MG tablet Take 1 tablet by mouth daily. Patient not taking: Reported on 02/20/2023    [provider]  valsartan (DIOVAN) 320 MG tablet TAKE 1 TABLET BY MOUTH EVERY DAY 01/12/23   Allwardt, Alyssa M, PA-C  venlafaxine XR (EFFEXOR-XR) 150 MG 24 hr capsule TAKE 1 CAPSULE BY MOUTH EVERY DAY IN THE MORNING 12/16/22   Allwardt, Crist Infante, PA-C     Family History  Problem Relation Age of Onset   Arthritis Maternal Grandmother    Arthritis Maternal Grandfather    Arthritis Paternal Grandmother    Arthritis Paternal Grandfather    Heart failure Paternal Grandfather    Breast cancer Mother 12   Lupus Mother    Heart failure Father    Hypertension Father    Hyperlipidemia Father    Sudden death Father    Obesity Father    Breast cancer Maternal Aunt 38   Ovarian cancer Maternal Aunt 60   Breast cancer Maternal Aunt 40    Social History   Socioeconomic History   Marital  status: Married    Spouse name: Lyda Jester    Number of children: 2   Years of education: Not on file   Highest education level: Some college, no degree  Occupational History   Occupation: Stay at [employer]  Tobacco Use   Smoking status: Never   Smokeless tobacco: Never   Tobacco comments:    never used tobacco  Vaping Use   Vaping status: Never Used  Substance and Sexual Activity   Alcohol use: No    Alcohol/week: 0.0 standard drinks of alcohol   Drug use: No   Sexual activity: Yes    Partners: Male  Birth control/protection: Surgical    Comment: hysterectomy  Other Topics Concern   Not on file  Social History Narrative   Married to [place]. 2 sons (2001) [name] and Trinna Post (1999). Has dog. She is unemployed, has worked in the past (office work). Completed some college. Enjoys Engineer, materials.   Social Drivers of Corporate investment banker Strain: Low Risk  (10/21/2017)   Overall Financial Resource Strain (CARDIA)    Difficulty of Paying Living Expenses: Not very hard  Food Insecurity: No Food Insecurity (04/14/2022)   Hunger Vital Sign    Worried About Running Out of Food in the Last Year: Never true    Ran Out of Food in the Last Year: Never true  Transportation Needs: No Transportation Needs (04/14/2022)   PRAPARE - Administrator, Civil Service (Medical): No    Lack of Transportation (Non-Medical): No  Physical Activity: Insufficiently Active (10/21/2017)   Exercise Vital Sign    Days of Exercise per Week: 3 days    Minutes of Exercise per Session: 20 min  Stress: No Stress Concern Present (10/21/2017)   [employer] of Occupational Health - Occupational Stress Questionnaire    Feeling of Stress : Only a little  Social Connections: Not on file    ECOG Status: 1 - Symptomatic but completely ambulatory  Review of Systems: A 12 point ROS discussed and pertinent positives are indicated in the HPI above.  All other systems are negative.  Review of  Systems  Carcinoid symptoms including flushing, n/v, and diarrhea.Marland KitchenMarland KitchenControlled by Lanreotide.  Vital Signs: There were no vitals taken for this visit.  Physical Exam  Imaging: No results found.  Labs:  CBC: Recent Labs    09/26/22 1011 10/24/22 1018 11/24/22 1029 02/20/23 1108  WBC 10.2 10.1 9.2 9.5  HGB 13.4 13.3 12.9 12.7  HCT 42.1 41.4 39.1 39.3  PLT 316 283 298 307    COAGS: No results for input(s): "INR", "APTT" in the last 8760 hours.  BMP: Recent Labs    09/26/22 1011 10/24/22 1018 11/24/22 1029 02/20/23 1108  NA 141 141 139 139  K 5.2* 5.5* 3.9 4.0  CL 104 103 104 105  CO2 29 31 26 24   GLUCOSE 117* 111* 104* 102*  BUN 15 13 13 17   CALCIUM 9.7 10.0 9.1 9.2  CREATININE 0.76 0.85 0.72 0.75  GFRNONAA >60 >60 >60 >60    LIVER FUNCTION TESTS: Recent Labs    09/26/22 1011 10/24/22 1018 11/24/22 1029 02/20/23 1108  BILITOT 0.5 0.6 0.3 0.2  AST 19 13* 13* 18  ALT 15 11 10 13   ALKPHOS 72 68 72 69  PROT 7.8 7.4 7.3 7.9  ALBUMIN 4.3 4.3 3.9 3.9    TUMOR MARKERS: Recent Labs    05/13/22 0907 08/21/22 1016 11/24/22 1029 02/20/23 1108  CHROMOGA 120.4* 75.9 89.6 111.7*    Assessment and Plan:  [Patient is a candidate for peptide receptor radiotherapy providing DOTATATE demostrates radiotracer avid tumores. Patient has symptoms attributable to carcinoid tumor including nausea and hot flashes and diarrhea. Symptoms controlled with lanreotide injection.   The patient was counseled on the primary goal of therapy which is prolongation of progression free survival (79% improvement over standard therapy).  Secondary goals would include decrease in tumor burden and decrease in carcinoid symptoms.    Primary of toxicities of therapy were explained to patient including marrow suppression, renal toxicity and hepatic toxicity.  Rare toxicity of myelosuppression and leukemia also explained.  Potential  toxicity will be monitored throughout the course of  therapy with interval  CBC and CMP laboratory evaluation.    Four therapies will be scheduled  2 months apart over a  six-month interval.  Patient will receive IM Sandostatin injection in the molecular imaging department after each therapy.  Patient will return to oncology clinic 1 month following each therapy for CBC and CMP and IM Sandostatin injection.   Thank you for this interesting consult.  I greatly enjoyed meeting LACOLE CUMBO and look forward to participating in their care.  A copy of this report was sent to the requesting provider on this date.  Electronically Signed: Patriciaann Clan, MD 02/26/2023, 1:39 PM   I spent a total of  30 Minutes   in face to face in clinical consultation, greater than 50% of which was counseling/coordinating care for metastatic neuroendocrine tumor.

## 2023-03-03 DIAGNOSIS — G4733 Obstructive sleep apnea (adult) (pediatric): Secondary | ICD-10-CM | POA: Diagnosis not present

## 2023-03-03 DIAGNOSIS — F439 Reaction to severe stress, unspecified: Secondary | ICD-10-CM | POA: Diagnosis not present

## 2023-03-03 DIAGNOSIS — E1169 Type 2 diabetes mellitus with other specified complication: Secondary | ICD-10-CM | POA: Diagnosis not present

## 2023-03-04 ENCOUNTER — Ambulatory Visit (HOSPITAL_COMMUNITY)
Admission: RE | Admit: 2023-03-04 | Discharge: 2023-03-04 | Disposition: A | Discharge status: Home / Self Care | Payer: BC Managed Care – PPO | Source: Ambulatory Visit | Attending: Hematology & Oncology | Admitting: Hematology & Oncology

## 2023-03-04 DIAGNOSIS — C7B02 Secondary carcinoid tumors of liver: Secondary | ICD-10-CM | POA: Diagnosis not present

## 2023-03-04 DIAGNOSIS — C7A8 Other malignant neuroendocrine tumors: Secondary | ICD-10-CM | POA: Diagnosis not present

## 2023-03-04 DIAGNOSIS — C7A098 Malignant carcinoid tumors of other sites: Secondary | ICD-10-CM | POA: Diagnosis not present

## 2023-03-04 MED ORDER — COPPER CU 64 DOTATATE 1 MCI/ML IV SOLN
4.0000 | Freq: Once | INTRAVENOUS | Status: AC
  Administered 2023-03-04: 3.95 via INTRAVENOUS

## 2023-03-06 ENCOUNTER — Other Ambulatory Visit: Payer: Self-pay | Admitting: Hematology & Oncology

## 2023-03-06 ENCOUNTER — Encounter (HOSPITAL_COMMUNITY): Payer: Self-pay

## 2023-03-06 DIAGNOSIS — C7B8 Other secondary neuroendocrine tumors: Secondary | ICD-10-CM

## 2023-03-07 ENCOUNTER — Other Ambulatory Visit: Payer: Self-pay | Admitting: Physician Assistant

## 2023-03-10 ENCOUNTER — Ambulatory Visit: Payer: Self-pay | Admitting: Physician Assistant

## 2023-03-10 ENCOUNTER — Ambulatory Visit (INDEPENDENT_AMBULATORY_CARE_PROVIDER_SITE_OTHER): Payer: BC Managed Care – PPO | Admitting: Family Medicine

## 2023-03-10 VITALS — BP 124/82 | HR 100 | Temp 97.7°F | Resp 18 | Ht 59.0 in | Wt 169.2 lb

## 2023-03-10 DIAGNOSIS — J029 Acute pharyngitis, unspecified: Secondary | ICD-10-CM | POA: Diagnosis not present

## 2023-03-10 DIAGNOSIS — C7B8 Other secondary neuroendocrine tumors: Secondary | ICD-10-CM | POA: Diagnosis not present

## 2023-03-10 DIAGNOSIS — R059 Cough, unspecified: Secondary | ICD-10-CM | POA: Diagnosis not present

## 2023-03-10 DIAGNOSIS — J069 Acute upper respiratory infection, unspecified: Secondary | ICD-10-CM

## 2023-03-10 LAB — POCT INFLUENZA A/B
Influenza A, POC: NEGATIVE
Influenza B, POC: NEGATIVE

## 2023-03-10 LAB — POC COVID19 BINAXNOW: SARS Coronavirus 2 Ag: NEGATIVE

## 2023-03-10 LAB — POCT RAPID STREP A (OFFICE): Rapid Strep A Screen: NEGATIVE

## 2023-03-10 MED ORDER — AZITHROMYCIN 250 MG PO TABS: ORAL_TABLET | ORAL | 0 refills | Status: AC

## 2023-03-10 NOTE — Patient Instructions (Signed)
It was very nice to see you today!  Meds sent to pharmacy.   PLEASE NOTE:  If you had any lab tests please let us know if you have not heard back within a few days. You may see your results on MyChart before we have a chance to review them but we will give you a call once they are reviewed by [place]. If we ordered any referrals today, please let us know if you have not heard from their office within the next week.   Please try these tips to maintain a healthy lifestyle:  Eat most of your calories during the day when you are active. Eliminate processed foods including packaged sweets (pies, cakes, cookies), reduce intake of potatoes, white bread, white pasta, and white rice. Look for whole grain options, oat flour or almond flour.  Each meal should contain half fruits/vegetables, one quarter protein, and one quarter carbs (no bigger than a computer mouse).  Cut down on sweet beverages. This includes juice, soda, and sweet tea. Also watch fruit intake, though this is a healthier sweet option, it still contains natural sugar! Limit to 3 servings daily.  Drink at least 1 glass of water with each meal and aim for at least 8 glasses per day  Exercise at least 150 minutes every week.

## 2023-03-10 NOTE — Telephone Encounter (Signed)
Patient scheduled with Ruthine Dose at 10 am today.

## 2023-03-10 NOTE — Progress Notes (Signed)
Subjective:     Patient ID: Leah [name], female    DOB: 09/29/1960, 63 y.o.   MRN: [id number]  Chief Complaint  Patient presents with   Cough    Dry cough x 2 days   Sore Throat    Started today   Nausea    Started a few days ago   Sinus Problem    Sinus drainage and nasal congestion   Back Pain    Right sided back pain    HPI Discussed the use of AI scribe software for clinical note transcription with the patient, who gave verbal consent to proceed.  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 She was driven by her son to the appointment.  She has been experiencing respiratory symptoms for the past few days, including a sore throat, dry cough, sinus drainage, congestion, and body aches. She feels 'loopy' and disoriented, which led her to have her son drive her to the appointment. No fever is present, but she feels as though she might have one. Additionally, she reports a stomach ache and back pain that started before the respiratory symptoms-prob related to carcinoid.  Shortness of breath is described as her worst symptom and is a newer development. She has previously experienced dizziness upon standing and breathlessness. She notes back pain that feels itchy, which began before the onset of her cough. She used her inhaler this morning, which provided some relief.  She has a history of carcinoid tumors on her liver and mentions that her tumors have grown. She is currently undergoing infusions as part of her treatment. She reports recurrent pain in her liver area this week.  She has a history of vertigo, with severe episodes leading to vomiting after ear irrigation. Today, she mentions that her ears are not doing well.  Her family history is significant for ovarian and breast cancer, with all the women in her family having died from these conditions by age 65. She underwent pre-emptive surgeries due to this  family history, although she did not have the genetic markers for these cancers.       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   Constipation    Diabetes (HCC)    Dyspnea    with exertion   Fatty liver    Gallbladder problem    GE reflux    Hepatic encephalopathy (HCC) 2011   History of PCR DNA positive for HSV1    History of retinal vein occlusion, right eye    HTN (hypertension) 2011   Joint pain    Liver problem    Neuroendocrine tumor 2011   Orthostatic hypotension    Osteoarthritis    Osteopenia    Palpitations    Sarcoidosis 2008   Sleep apnea  uses cpap   Surgical menopause     Past Surgical History:  Procedure Laterality Date   ABDOMINAL HYSTERECTOMY  2004   AUGMENTATION MAMMAPLASTY     BREAST BIOPSY Left    BREAST RECONSTRUCTION Bilateral 05/07/2020   Procedure: Revision of bilateral breast reconstruction;  Surgeon: Allena Napoleon, MD;  Location: [facility];  Service: Plastics;  Laterality: Bilateral;  2 hours   CESAREAN SECTION  1610,9604   CHOLECYSTECTOMY  2006   HERNIA REPAIR     KNEE ARTHROSCOPY Left 09/2010   LAPAROSCOPIC HEPATECTOMY     LIPOSUCTION Bilateral 05/07/2020   Procedure: LIPOSUCTION with excision of lateral breast and axillary tissue;  Surgeon: Allena Napoleon, MD;  Location:  SURGERY CENTER;  Service: Plastics;  Laterality: Bilateral;   LIVER SURGERY     MASTECTOMY  Bilateral 2008   NASAL SINUS SURGERY     OOPHORECTOMY  2004   RECONSTRUCTION BREAST W/ LATISSIMUS DORSI FLAP Bilateral    TONSILLECTOMY  1976   TOTAL KNEE ARTHROPLASTY Left 10/29/2020   Procedure: TOTAL KNEE ARTHROPLASTY;  Surgeon: Ollen Gross, MD;  Location: [facility];  Service: Orthopedics;  Laterality: Left;   TOTAL KNEE ARTHROPLASTY Right 04/14/2022   Procedure: TOTAL KNEE ARTHROPLASTY;  Surgeon: Ollen Gross, MD;  Location: [facility];  Service: Orthopedics;  Laterality: Right;   TUBAL LIGATION Bilateral 2001     Current Outpatient Medications:    albuterol (VENTOLIN HFA) 108 (90 Base) MCG/ACT inhaler, Inhale 2 puffs into the lungs every 6 (six) hours as needed for wheezing or shortness of breath., Disp: 8 g, Rfl: 0   amLODipine (NORVASC) 10 MG tablet, TAKE 1 TABLET BY MOUTH EVERY DAY, Disp: 90 tablet, Rfl: 3   azithromycin (ZITHROMAX) 250 MG tablet, Take 2 tablets on day 1, then 1 tablet daily on days 2 through 5, Disp: 6 tablet, Rfl: 0   clonazePAM (KLONOPIN) 0.5 MG tablet, TAKE 1 TABLET BY MOUTH 2 TIMES DAILY., Disp: 60 tablet, Rfl: 2   docusate sodium (COLACE) 100 MG capsule, TAKE 1 CAPSULE BY MOUTH TWICE A DAY, Disp: 30 capsule, Rfl: 0   fexofenadine (ALLEGRA) 180 MG tablet, TAKE 1 TABLET BY MOUTH EVERY DAY, Disp: 90 tablet, Rfl: 3   metFORMIN (GLUCOPHAGE) 500 MG tablet, Take 1 tablet (500 mg total) by mouth 2 (two) times daily with a meal., Disp: 180 tablet, Rfl: 3   methocarbamol (ROBAXIN) 500 MG tablet, Take 1 tablet (500 mg total) by mouth every 6 (six) hours as needed for muscle spasms., Disp: 40 tablet, Rfl: 0   metoprolol succinate (TOPROL-XL) 100 MG 24 hr tablet, TAKE 1 TABLET BY MOUTH EVERY DAY EVERY EVENING WITH OR IMMEDIATELY FOLLOWING A MEAL, Disp: 90 tablet, Rfl: 1   montelukast (SINGULAIR) 10 MG tablet, TAKE 1 TABLET BY MOUTH EVERYDAY AT BEDTIME, Disp: 90 tablet, Rfl: 2   MOUNJARO 2.5 MG/0.5ML Pen, Inject 2.5 mg into the skin once a week., Disp: , Rfl:    Octreotide  Acetate (SANDOSTATIN IJ), Inject 1 Dose as directed every 30 (thirty) days., Disp: , Rfl:    ondansetron (ZOFRAN-ODT) 4 MG disintegrating tablet, TAKE 1 TABLET BY MOUTH EVERY 8 HOURS AS NEEDED *INS MAX 18 TABS PER 21 DAYS*, Disp: 18 tablet, Rfl: 3   pantoprazole (PROTONIX) 40 MG tablet, TAKE 1 TABLET BY MOUTH TWICE A DAY, Disp: 180 tablet, Rfl: 0   rosuvastatin (CRESTOR) 10 MG tablet, TAKE 1 TABLET BY MOUTH EVERY DAY, Disp: 90 tablet, Rfl: 0   scopolamine (TRANSDERM-SCOP)  1 MG/3DAYS, Place 1 patch (1.5 mg total) onto the skin every 3 (three) days., Disp: 10 patch, Rfl: 12   TURMERIC PO, Take 1 capsule by mouth daily., Disp: , Rfl:    valACYclovir (VALTREX) 500 MG tablet, Take 1 tablet by mouth daily., Disp: , Rfl:    valsartan (DIOVAN) 320 MG tablet, TAKE 1 TABLET BY MOUTH EVERY DAY, Disp: 90 tablet, Rfl: 0   venlafaxine XR (EFFEXOR-XR) 150 MG 24 hr capsule, TAKE 1 CAPSULE BY MOUTH EVERY DAY IN THE MORNING, Disp: 90 capsule, Rfl: 1   gabapentin (NEURONTIN) 100 MG capsule, Take 1 capsule (100 mg total) by mouth at bedtime., Disp: 90 capsule, Rfl: 3  No Known Allergies ROS neg/noncontributory except as noted HPI/below      Objective:     BP 124/82   Pulse 100   Temp 97.7 F (36.5 C) (Temporal)   Resp 18   Ht 4\' 11"  (1.499 m)   Wt 169 lb 4 oz (76.8 kg)   SpO2 98%   BMI 34.18 kg/m  Wt Readings from Last 3 Encounters:  03/10/23 169 lb 4 oz (76.8 kg)  02/20/23 170 lb 1.3 oz (77.1 kg)  11/24/22 167 lb 1.9 oz (75.8 kg)    Physical Exam   Gen: WDWN NAD HEENT: NCAT, conjunctiva not injected, sclera nonicteric TM WNL B, OP moist, no exudates  NECK:  supple, no thyromegaly, no nodes, CARDIAC: tachy RRR, S1S2+, no murmur.  LUNGS: CTAB. No wheezes EXT:  no edema MSK: no gross abnormalities.  NEURO: A&O x3.  CN II-XII intact.  PSYCH: normal mood. Good eye contact  Results for orders placed or performed in visit on 03/10/23  POCT Influenza A/B   Collection Time: 03/10/23 11:12 AM   Result Value Ref Range   Influenza A, POC Negative Negative   Influenza B, POC Negative Negative  POCT rapid strep A   Collection Time: 03/10/23 11:12 AM  Result Value Ref Range   Rapid Strep A Screen Negative Negative  POC COVID-19   Collection Time: 03/10/23 11:13 AM  Result Value Ref Range   SARS Coronavirus 2 Ag Negative Negative   *Note: Due to a large number of results and/or encounters for the requested time period, some results have not been displayed. A complete set of results can be found in Results Review.        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 Reports feeling loopy and experiencing shortness of breath, which is worse than usual. Given their chemotherapy for carcinoid tumors, there is heightened concern for secondary bacterial infection. Differential diagnosis includes viral infection, influenza, and COVID-19. Discussed risks of untreated bacterial infection, benefits of early antibiotic intervention, and potential outcomes of viral versus bacterial etiology. Order COVID-19 and influenza tests.  Prescribe azithromycin (Z-Pak). Advise rest and monitor symptoms. Instruct to inform if tests are positive for flu or COVID-19 for potential change in treatment.  Carcinoid Tumors of the Liver Known carcinoid tumors on the liver with recent growth noted. Currently undergoing chemotherapy. Reports  feeling sick and experiencing liver pain, which may be related to tumor growth or treatment side effects. Discussed ongoing monitoring and potential adjustments to chemotherapy regimen based on tumor response and side effects. Continue current chemotherapy regimen. Monitor liver function and tumor growth.   General Health Maintenance Underwent prophylactic mastectomy and oophorectomy due to strong family history of ovarian and breast cancer. No current issues with breast or ovarian cancer. Discussed importance of regular follow-ups for cancer surveillance. Continue regular follow-ups with oncology for cancer surveillance.  Follow-up Follow up regarding test results. Adjust treatment plan based on test outcomes. Schedule follow-up appointment as needed.        Return if symptoms worsen or fail to improve.  Angelena Sole, MD

## 2023-03-10 NOTE — Telephone Encounter (Signed)
Copied from CRM [id number]. Topic: Clinical - Red Word Triage >> Mar 10, 2023  8:29 AM Fredrich Romans wrote: Red Word that prompted transfer to Nurse Triage: hard time breathing ,chest congestion   Chief Complaint: Congestion/Post Nasal Drip/Dry cough/Sore throat/Nausea Symptoms: post nasal drip, chest soreness only when coughing, nausea Frequency: past couple of days Pertinent Negatives: Patient denies recent travel, history of blood clots, blood when she does cough anything up. Disposition: [] ED /[] Urgent Care (no appt availability in office) / [x] Appointment(In office/virtual)/ []  [facility]/ [] Home Care/ [] Refused Recommended Disposition /[] [facility]/ []  Follow-up with PCP Additional Notes: Patient called and advised that she has had a little bit of nausea for the past few days and woke up yesterday with a sore throat.  Patient states that she has had a dry cough for two days.  No recent exposures to anyone sick that she is aware of.  She states that it feels like sinus drainage/post nasal drip.  Patient has been around her grandson who has been around other school children.  Patient has been taking Zofran for nausea but it isn't helping.  Patient has used her inhaler and it hasn't help much.  She states that her coughing has been continuous and it is heard while triaging her.  Patient also states that she feels short of breath.  She denies any blood when she does cough anything up, recent travel, history of blood clots. Appointment made for today 03/10/2023 at patient's PCP office with Dr Jeani Sow at 10am.  Patient is also advised that if anything gets worse she can call 911 or go to the emergency room.  Patient verbalized understanding.  Reason for Disposition  [1] MILD difficulty breathing (e.g., minimal/no SOB at rest, SOB with walking, pulse <100) AND [2] still present when not coughing  Answer Assessment - Initial Assessment Questions 1. ONSET: "When did the cough  begin?"      Two days ago 2. SEVERITY: "How bad is the cough today?"      Continuous 3. SPUTUM: "Describe the color of your sputum" (none, dry cough; clear, white, yellow, green)     No color 4. HEMOPTYSIS: "Are you coughing up any blood?" If so ask: "How much?" (flecks, streaks, tablespoons, etc.)     No 5. DIFFICULTY BREATHING: "Are you having difficulty breathing?" If Yes, ask: "How bad is it?" (e.g., mild, moderate, severe)    - MILD: No SOB at rest, mild SOB with walking, speaks normally in sentences, can lie down, no retractions, pulse < 100.    - MODERATE: SOB at rest, SOB with minimal exertion and prefers to sit, cannot lie down flat, speaks in phrases, mild retractions, audible wheezing, pulse 100-120.    - SEVERE: Very SOB at rest, speaks in single words, struggling to breathe, sitting hunched forward, retractions, pulse > 120      Steam helps her to breathe---she used her inhaler and it didn't help much 6. FEVER: "Do you have a fever?" If Yes, ask: "What is your temperature, how was it measured, and when did it start?"    "Felt hot but didn't take temperature" 7. CARDIAC HISTORY: "Do you have any history of heart disease?" (e.g., heart attack, congestive heart failure)      No 8. LUNG HISTORY: "Do you have any history of lung disease?"  (e.g., pulmonary embolus, asthma, emphysema)     No 9. PE RISK FACTORS: "Do you have a history of blood clots?" (or: recent major  surgery, recent prolonged travel, bedridden)     No 10. OTHER SYMPTOMS: "Do you have any other symptoms?" (e.g., runny nose, wheezing, chest pain)       Chest soreness when coughing, post nasal drip, nausea 12. TRAVEL: "Have you traveled out of the country in the last month?" (e.g., travel history, exposures)       No  Protocols used: Cough - Acute Non-Productive-A-AH

## 2023-03-12 ENCOUNTER — Encounter (HOSPITAL_COMMUNITY): Payer: BC Managed Care – PPO

## 2023-03-16 ENCOUNTER — Other Ambulatory Visit: Payer: Self-pay | Admitting: Hematology & Oncology

## 2023-03-16 DIAGNOSIS — C7B8 Other secondary neuroendocrine tumors: Secondary | ICD-10-CM

## 2023-03-17 ENCOUNTER — Encounter (HOSPITAL_COMMUNITY): Admission: RE | Admit: 2023-03-17 | Payer: BC Managed Care – PPO | Source: Ambulatory Visit

## 2023-03-17 ENCOUNTER — Ambulatory Visit: Payer: BC Managed Care – PPO | Admitting: Physician Assistant

## 2023-03-17 ENCOUNTER — Inpatient Hospital Stay (HOSPITAL_COMMUNITY): Admission: RE | Admit: 2023-03-17 | Payer: BC Managed Care – PPO | Source: Ambulatory Visit

## 2023-03-17 MED ORDER — SODIUM CHLORIDE 0.9 % IV SOLN
8.0000 mg | Freq: Once | INTRAVENOUS | Status: AC
  Administered 2023-03-18: 8 mg via INTRAVENOUS
  Filled 2023-03-17: qty 4

## 2023-03-17 MED ORDER — OCTREOTIDE ACETATE 500 MCG/ML IJ SOLN: 500.0000 ug | Freq: Once | INTRAMUSCULAR | Status: DC | PRN

## 2023-03-17 MED ORDER — OCTREOTIDE ACETATE 30 MG IM KIT
30.0000 mg | PACK | Freq: Once | INTRAMUSCULAR | Status: AC
  Administered 2023-03-18: 30 mg via INTRAMUSCULAR

## 2023-03-17 MED ORDER — LUTETIUM LU 177 DOTATATE 370 MBQ/ML IV SOLN
200.0000 | Freq: Once | INTRAVENOUS | Status: AC
  Administered 2023-03-18: 190.45 via INTRAVENOUS

## 2023-03-17 MED ORDER — SODIUM CHLORIDE 0.9 % IV SOLN: 500.0000 mL | Freq: Once | INTRAVENOUS | Status: DC

## 2023-03-17 MED ORDER — AMINO ACID RADIOPROTECTANT - L-LYSINE 2.5%/L-ARGININE 2.5% IN NS
250.0000 mL/h | INTRAVENOUS | Status: AC
  Filled 2023-03-17: qty 1000

## 2023-03-17 MED ORDER — PROCHLORPERAZINE EDISYLATE 10 MG/2ML IJ SOLN: 10.0000 mg | Freq: Four times a day (QID) | INTRAMUSCULAR | Status: DC | PRN

## 2023-03-17 NOTE — Written Directive (Addendum)
 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

## 2023-03-18 ENCOUNTER — Inpatient Hospital Stay: Payer: BC Managed Care – PPO

## 2023-03-18 ENCOUNTER — Ambulatory Visit (HOSPITAL_COMMUNITY)
Admission: RE | Admit: 2023-03-18 | Discharge: 2023-03-18 | Disposition: A | Discharge status: Home / Self Care | Payer: BC Managed Care – PPO | Source: Ambulatory Visit | Attending: Hematology & Oncology | Admitting: Hematology & Oncology

## 2023-03-18 ENCOUNTER — Other Ambulatory Visit: Payer: Self-pay | Admitting: Physician Assistant

## 2023-03-18 ENCOUNTER — Inpatient Hospital Stay: Payer: BC Managed Care – PPO | Admitting: Hematology & Oncology

## 2023-03-18 VITALS — BP 135/74 | HR 78

## 2023-03-18 DIAGNOSIS — C7B8 Other secondary neuroendocrine tumors: Secondary | ICD-10-CM | POA: Insufficient documentation

## 2023-03-18 DIAGNOSIS — C220 Liver cell carcinoma: Secondary | ICD-10-CM | POA: Diagnosis not present

## 2023-03-18 DIAGNOSIS — E3400 Carcinoid syndrome, unspecified: Secondary | ICD-10-CM | POA: Diagnosis present

## 2023-03-18 DIAGNOSIS — C7A8 Other malignant neuroendocrine tumors: Secondary | ICD-10-CM | POA: Diagnosis not present

## 2023-03-18 LAB — CBC WITH DIFFERENTIAL/PLATELET
Abs Immature Granulocytes: 0.03 10*3/uL (ref 0.00–0.07)
Basophils Absolute: 0.1 10*3/uL (ref 0.0–0.1)
Basophils Relative: 1 %
Eosinophils Absolute: 0.3 10*3/uL (ref 0.0–0.5)
Eosinophils Relative: 3 %
HCT: 41.3 % (ref 36.0–46.0)
Hemoglobin: 13.2 g/dL (ref 12.0–15.0)
Immature Granulocytes: 0 %
Lymphocytes Relative: 35 %
Lymphs Abs: 3.6 10*3/uL (ref 0.7–4.0)
MCH: 28.9 pg (ref 26.0–34.0)
MCHC: 32 g/dL (ref 30.0–36.0)
MCV: 90.4 fL (ref 80.0–100.0)
Monocytes Absolute: 0.6 10*3/uL (ref 0.1–1.0)
Monocytes Relative: 6 %
Neutro Abs: 5.9 10*3/uL (ref 1.7–7.7)
Neutrophils Relative %: 55 %
Platelets: 336 10*3/uL (ref 150–400)
RBC: 4.57 MIL/uL (ref 3.87–5.11)
RDW: 13.9 % (ref 11.5–15.5)
WBC: 10.5 10*3/uL (ref 4.0–10.5)
nRBC: 0 % (ref 0.0–0.2)

## 2023-03-18 LAB — COMPREHENSIVE METABOLIC PANEL
ALT: 15 U/L (ref 0–44)
AST: 21 U/L (ref 15–41)
Albumin: 3.9 g/dL (ref 3.5–5.0)
Alkaline Phosphatase: 67 U/L (ref 38–126)
Anion gap: 11 (ref 5–15)
BUN: 14 mg/dL (ref 8–23)
CO2: 21 mmol/L — ABNORMAL LOW (ref 22–32)
Calcium: 8.9 mg/dL (ref 8.9–10.3)
Chloride: 106 mmol/L (ref 98–111)
Creatinine, Ser: 0.47 mg/dL (ref 0.44–1.00)
GFR, Estimated: >60 mL/min (ref >60)
Glucose, Bld: 147 mg/dL — ABNORMAL HIGH (ref 70–99)
Potassium: 3.9 mmol/L (ref 3.5–5.1)
Sodium: 138 mmol/L (ref 135–145)
Total Bilirubin: 0.8 mg/dL (ref 0.0–1.2)
Total Protein: 7.9 g/dL (ref 6.5–8.1)

## 2023-03-18 MED ORDER — AMINO ACID RADIOPROTECTANT - L-LYSINE 2.5%/L-ARGININE 2.5% IN NS
250.0000 mL/h | INTRAVENOUS | Status: AC
  Administered 2023-03-18: 250 mL/h via INTRAVENOUS

## 2023-03-18 MED ORDER — OCTREOTIDE ACETATE 30 MG IM KIT
PACK | INTRAMUSCULAR | Status: AC
  Filled 2023-03-18: qty 1

## 2023-03-18 NOTE — Progress Notes (Signed)
 CLINICAL DATA: [age] year-old [female] with metastatic neuroendocrine tumor. Well differentiated tumor with somatostatin receptor is identified within the [liver] by DOTATATE PET CT scan.    EXAM: NUCLEAR MEDICINE LUTATHERA  ADMINISTRATION  TECHNIQUE: Infusion: Two nuclear medicine technologist personally verified the dose activity ([191.4] mCi) to be delivered as specified in the written directive (200 mCi), and verified the patient identification via 2 separate methods.  20 gauge IV were started in the antecubital veins. Anti-emetics were administered by nursing staff. Amino acid renal protection was initiated 30 minutes prior to Lu 177 DOTATATE (Lutathera ) infusion and continued continuously for 4 hours. Lutathera  infusion was administered over 30 minutes.      The total administered dose was [190.5] mCi Lu 177 DOTATATE.    The entire IV tubing, venocatheter, stopcock and syringes was removed in total, placed in a disposal bag and sent for assay of the residual activity, which will be reported at a later time in our EMR by the physics staff. Pressure was applied to the venipuncture sites, and a compression bandage placed. Radiation Safety personnel were present to perform the discharge survey, as detailed on their documentation.    Patient received 30 mg IM long-acting Sandostatin  injection 4 hours after Lutathera  effusion in the nuclear medicine department.   RADIOPHARMACEUTICALS:   [190.5] mCi Lu 177 DOTATATE   FINDINGS: Diagnosis: [Metastatic neuroendocrine tumor.]    Current Infusion: [1]    Planned Infusions: [4]    The patient's most recent blood counts were reviewed and remains a good candidate to proceed with Lutathera . The patient was situated in an infusion suite and administered Lutathera  as above. Patient will follow-up with referring oncologist for interval serum laboratories (CBC and CMP) in approximately 4 weeks.       Patient received 30 mg IM  long-acting Sandostatin  injection 4 hours after Lutathera  effusion in the nuclear medicine department.     IMPRESSION: [First]  Lu 177 DOTATATE treatment for metastatic neuroendocrine tumor. The patient tolerated the infusion well. The patient will return in 8 weeks for ongoing care.

## 2023-03-18 NOTE — Telephone Encounter (Signed)
 Last OV: 03/10/23  Next OV: None  Last Filled: 05/27/2021  Quantity: 60 w/ 2 refills

## 2023-03-18 NOTE — Progress Notes (Signed)
 Tolerated Lutathera  treatment well.

## 2023-03-19 NOTE — Addendum Note (Signed)
 Encounter addended by: Vallorie Gayer, RN on: 03/19/2023 12:13 PM  Actions taken: Patient-reported medication modified, Order list changed, Order Reconciliation Section accessed

## 2023-03-25 ENCOUNTER — Inpatient Hospital Stay: Payer: BC Managed Care – PPO | Attending: Hematology & Oncology

## 2023-03-25 ENCOUNTER — Other Ambulatory Visit: Payer: Self-pay

## 2023-03-25 ENCOUNTER — Inpatient Hospital Stay (HOSPITAL_BASED_OUTPATIENT_CLINIC_OR_DEPARTMENT_OTHER): Payer: BC Managed Care – PPO | Admitting: Hematology & Oncology

## 2023-03-25 VITALS — BP 129/82 | HR 83 | Temp 98.4°F | Resp 16 | Ht 59.0 in | Wt 171.8 lb

## 2023-03-25 DIAGNOSIS — C7A00 Malignant carcinoid tumor of unspecified site: Secondary | ICD-10-CM | POA: Insufficient documentation

## 2023-03-25 DIAGNOSIS — C7B8 Other secondary neuroendocrine tumors: Secondary | ICD-10-CM | POA: Diagnosis not present

## 2023-03-25 DIAGNOSIS — C7B02 Secondary carcinoid tumors of liver: Secondary | ICD-10-CM | POA: Insufficient documentation

## 2023-03-25 LAB — CMP (CANCER CENTER ONLY)
ALT: 8 U/L (ref 0–44)
AST: 12 U/L — ABNORMAL LOW (ref 15–41)
Albumin: 4 g/dL (ref 3.5–5.0)
Alkaline Phosphatase: 64 U/L (ref 38–126)
Anion gap: 9 (ref 5–15)
BUN: 12 mg/dL (ref 8–23)
CO2: 27 mmol/L (ref 22–32)
Calcium: 9.5 mg/dL (ref 8.9–10.3)
Chloride: 105 mmol/L (ref 98–111)
Creatinine: 0.58 mg/dL (ref 0.44–1.00)
GFR, Estimated: >60 mL/min (ref >60)
Glucose, Bld: 128 mg/dL — ABNORMAL HIGH (ref 70–99)
Potassium: 4.7 mmol/L (ref 3.5–5.1)
Sodium: 141 mmol/L (ref 135–145)
Total Bilirubin: 0.5 mg/dL (ref 0.0–1.2)
Total Protein: 7.2 g/dL (ref 6.5–8.1)

## 2023-03-25 LAB — CBC WITH DIFFERENTIAL (CANCER CENTER ONLY)
Abs Immature Granulocytes: 0.03 10*3/uL (ref 0.00–0.07)
Basophils Absolute: 0 10*3/uL (ref 0.0–0.1)
Basophils Relative: 1 %
Eosinophils Absolute: 0.2 10*3/uL (ref 0.0–0.5)
Eosinophils Relative: 2 %
HCT: 37.1 % (ref 36.0–46.0)
Hemoglobin: 12.3 g/dL (ref 12.0–15.0)
Immature Granulocytes: 0 %
Lymphocytes Relative: 17 %
Lymphs Abs: 1.5 10*3/uL (ref 0.7–4.0)
MCH: 29.5 pg (ref 26.0–34.0)
MCHC: 33.2 g/dL (ref 30.0–36.0)
MCV: 89 fL (ref 80.0–100.0)
Monocytes Absolute: 0.6 10*3/uL (ref 0.1–1.0)
Monocytes Relative: 7 %
Neutro Abs: 6.5 10*3/uL (ref 1.7–7.7)
Neutrophils Relative %: 73 %
Platelet Count: 281 10*3/uL (ref 150–400)
RBC: 4.17 MIL/uL (ref 3.87–5.11)
RDW: 13.8 % (ref 11.5–15.5)
WBC Count: 8.8 10*3/uL (ref 4.0–10.5)
nRBC: 0 % (ref 0.0–0.2)

## 2023-03-25 MED ORDER — ONDANSETRON 4 MG PO TBDP: 4.0000 mg | ORAL_TABLET | Freq: Three times a day (TID) | ORAL | 3 refills | Status: DC | PRN

## 2023-03-25 NOTE — Progress Notes (Signed)
Hematology and Oncology Follow Up Visit  Leah [name] [id number] 1960-10-11 63 y.o. 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 Oplinger is here today for follow-up.  We went ahead and got her into the Lutathera protocol.  She has got her first cycle.  Very happy about this for she did well with this.  She had little bit of nausea.  She has little bit of fatigue.  She has had no rashes.  There is been no bleeding.  Her initial Chromogranin A level was 111.  She has had no fever.  There is been no cough.  She has had no issue with COVID.  She has had no leg swelling.  Overall, her performance status is probably ECOG 1.    Wt Readings from Last 3 Encounters:  03/25/23 171 lb 12.8 oz (77.9 kg)  03/10/23 169 lb 4 oz (76.8 kg)  02/20/23 170 lb 1.3 oz (77.1 kg)   Medications:  Allergies as of 03/25/2023   No Known Allergies      Medication List        Accurate as of March 25, 2023  8:19 AM. If you have any questions, ask your nurse or doctor.          albuterol 108 (90 Base) MCG/ACT inhaler Commonly known as: VENTOLIN HFA Inhale 2 puffs into the lungs every 6 (six) hours as needed for wheezing or shortness of breath.   amLODipine 10 MG tablet Commonly known as: NORVASC TAKE 1 TABLET BY MOUTH EVERY DAY   clonazePAM 0.5 MG tablet Commonly known as: KLONOPIN TAKE 1 TABLET BY MOUTH TWICE A DAY   docusate sodium 100 MG capsule Commonly known as: COLACE TAKE 1 CAPSULE BY MOUTH TWICE A DAY   fexofenadine 180 MG tablet Commonly known as: ALLEGRA TAKE 1 TABLET BY MOUTH EVERY DAY   gabapentin 100 MG capsule Commonly known as: NEURONTIN Take 1 capsule (100 mg total) by mouth at bedtime.   metFORMIN 500 MG tablet Commonly known as:  GLUCOPHAGE Take 1 tablet (500 mg total) by mouth 2 (two) times daily with a meal.   methocarbamol 500 MG tablet Commonly known as: ROBAXIN Take 1 tablet (500 mg total) by mouth every 6 (six) hours as needed for muscle spasms.   metoprolol succinate 100 MG 24 hr tablet Commonly known as: TOPROL-XL TAKE 1 TABLET BY MOUTH EVERY DAY EVERY EVENING WITH OR IMMEDIATELY FOLLOWING A MEAL   montelukast 10 MG tablet Commonly known as: SINGULAIR TAKE 1 TABLET BY MOUTH EVERYDAY AT BEDTIME   Mounjaro 2.5 MG/0.5ML Pen Generic drug: tirzepatide Inject 2.5 mg into the skin once a week.   ondansetron 4 MG disintegrating tablet Commonly known as: ZOFRAN-ODT Take 1 tablet (4 mg total) by mouth every 8 (eight) hours as needed for nausea or vomiting. What changed: See the new instructions. Changed by: Nurse Tiffany H   pantoprazole 40 MG tablet Commonly known as: PROTONIX TAKE 1 TABLET BY MOUTH TWICE A DAY   rosuvastatin 10 MG tablet Commonly known as: CRESTOR TAKE 1 TABLET BY MOUTH EVERY DAY   SANDOSTATIN IJ Inject 1 Dose as directed every 30 (thirty) days.   scopolamine 1 MG/3DAYS Commonly known as: TRANSDERM-SCOP Place 1 patch (1.5 mg total) onto the skin every 3 (three) days.   TURMERIC  PO Take 1 capsule by mouth daily.   valACYclovir 500 MG tablet Commonly known as: VALTREX Take 1 tablet by mouth daily.   valsartan 320 MG tablet Commonly known as: DIOVAN TAKE 1 TABLET BY MOUTH EVERY DAY   venlafaxine XR 150 MG 24 hr capsule Commonly known as: EFFEXOR-XR TAKE 1 CAPSULE BY MOUTH EVERY DAY IN THE MORNING        Allergies: No Known Allergies  Past Medical History, Surgical history, Social history, and Family History were reviewed and updated.  Review of Systems: Review of Systems  Constitutional: Negative.   HENT: Negative.    Eyes: Negative.   Respiratory: Negative.    Cardiovascular: Negative.   Gastrointestinal: Negative.   Genitourinary: Negative.    Musculoskeletal: Negative.   Skin: Negative.   Neurological: Negative.   Endo/Heme/Allergies: Negative.   Psychiatric/Behavioral: Negative.     Physical Exam:  height is 4\' 11"  (1.499 m) and weight is 171 lb 12.8 oz (77.9 kg). Her oral temperature is 98.4 F (36.9 C). Her blood pressure is 129/82 and her pulse is 83. Her respiration is 16 and oxygen saturation is 100%.   Wt Readings from Last 3 Encounters:  03/25/23 171 lb 12.8 oz (77.9 kg)  03/10/23 169 lb 4 oz (76.8 kg)  02/20/23 170 lb 1.3 oz (77.1 kg)    Physical Exam Vitals reviewed.  HENT:     Head: Normocephalic and atraumatic.  Eyes:     Pupils: Pupils are equal, round, and reactive to light.  Cardiovascular:     Rate and Rhythm: Normal rate and regular rhythm.     Heart sounds: Normal heart sounds.  Pulmonary:     Effort: Pulmonary effort is normal.     Breath sounds: Normal breath sounds.  Abdominal:     General: Bowel sounds are normal.     Palpations: Abdomen is soft.     Comments: Abdominal exam shows an obese abdomen that is soft.  She has good bowel sounds.  There is no fluid wave. There is no tenderness.  There is no guarding or rebound.  There is no palpable liver or spleen tip.  Musculoskeletal:        General: No tenderness or deformity.     Cervical back: Normal range of motion.  Lymphadenopathy:     Cervical: No cervical adenopathy.  Skin:    General: Skin is warm and dry.     Findings: No erythema or rash.  Neurological:     Mental Status: She is alert and oriented to person, place, and time.  Psychiatric:        Behavior: Behavior normal.        Thought Content: Thought content normal.        Judgment: Judgment normal.      Lab Results  Component Value Date   WBC 8.8 03/25/2023   HGB 12.3 03/25/2023   HCT 37.1 03/25/2023   MCV 89.0 03/25/2023   PLT 281 03/25/2023   Lab Results  Component Value Date   FERRITIN 382 (H) 10/18/2008   IRON <10 (L) 10/18/2008   TIBC NOT CALC Not  calculated due to Iron <10. 10/18/2008   UIBC 156 10/18/2008   IRONPCTSAT NOT CALC Not calculated due to Iron <10. 10/18/2008   Lab Results  Component Value Date   RETICCTPCT 0.9 10/18/2008   RBC 4.17 03/25/2023   No results found for: "KPAFRELGTCHN", "LAMBDASER", "KAPLAMBRATIO" No results found for: "IGGSERUM", "IGA", "IGMSERUM" No results found for: "TOTALPROTELP", "ALBUMINELP", "A1GS", "  A2GS", "BETS", "BETA2SER", "GAMS", "MSPIKE", "SPEI"   Chemistry      Component Value Date/Time   NA 138 03/18/2023 0729   NA 140 01/20/2017 1416   NA 141 01/17/2016 1047   K 3.9 03/18/2023 0729   K 3.9 01/20/2017 1416   K 3.2 (L) 01/17/2016 1047   CL 106 03/18/2023 0729   CL 102 01/20/2017 1416   CO2 21 (L) 03/18/2023 0729   CO2 26 01/20/2017 1416   CO2 27 01/17/2016 1047   BUN 14 03/18/2023 0729   BUN 15 01/20/2017 1416   BUN 6.6 (L) 01/17/2016 1047   CREATININE 0.47 03/18/2023 0729   CREATININE 0.75 02/20/2023 1108   CREATININE 1.1 01/20/2017 1416   CREATININE 0.7 01/17/2016 1047      Component Value Date/Time   CALCIUM 8.9 03/18/2023 0729   CALCIUM 9.1 01/20/2017 1416   CALCIUM 8.7 01/17/2016 1047   ALKPHOS 67 03/18/2023 0729   ALKPHOS 84 01/20/2017 1416   ALKPHOS 90 01/17/2016 1047   AST 21 03/18/2023 0729   AST 18 02/20/2023 1108   AST 34 01/17/2016 1047   ALT 15 03/18/2023 0729   ALT 13 02/20/2023 1108   ALT 35 01/20/2017 1416   ALT 38 01/17/2016 1047   BILITOT 0.8 03/18/2023 0729   BILITOT 0.2 02/20/2023 1108   BILITOT 0.42 01/17/2016 1047       Impression and Plan: Ms. Oshima is a very pleasant 63 yo caucasian female with long standing history of metastatic carcinoid with recurrent liver metastasis.  She had progressive disease.  We subsequently got her started on Lutathera.  She had her Somatuline injection last week.  Her next Remus Loffler will be in April.  I will have her come back in about a month for Somatuline.  I would like to hope that her Chromogranin A  level will start going down.  We will see her back in a month.     Josph Macho, MD 2/12/20258:19 AM   Christin Bach, MD

## 2023-03-26 LAB — CHROMOGRANIN A: Chromogranin A (ng/mL): 78.3 ng/mL (ref 0.0–101.8)

## 2023-03-31 ENCOUNTER — Other Ambulatory Visit: Payer: Self-pay | Admitting: Physician Assistant

## 2023-04-07 ENCOUNTER — Ambulatory Visit (INDEPENDENT_AMBULATORY_CARE_PROVIDER_SITE_OTHER): Payer: BC Managed Care – PPO | Admitting: Physician Assistant

## 2023-04-07 VITALS — BP 136/78 | HR 100 | Temp 97.2°F | Ht 59.0 in | Wt 170.0 lb

## 2023-04-07 DIAGNOSIS — R110 Nausea: Secondary | ICD-10-CM

## 2023-04-07 DIAGNOSIS — D3A8 Other benign neuroendocrine tumors: Secondary | ICD-10-CM | POA: Diagnosis not present

## 2023-04-07 DIAGNOSIS — E1169 Type 2 diabetes mellitus with other specified complication: Secondary | ICD-10-CM | POA: Diagnosis not present

## 2023-04-07 DIAGNOSIS — R1084 Generalized abdominal pain: Secondary | ICD-10-CM | POA: Diagnosis not present

## 2023-04-07 DIAGNOSIS — Z23 Encounter for immunization: Secondary | ICD-10-CM | POA: Diagnosis not present

## 2023-04-07 DIAGNOSIS — Z1211 Encounter for screening for malignant neoplasm of colon: Secondary | ICD-10-CM

## 2023-04-07 DIAGNOSIS — B351 Tinea unguium: Secondary | ICD-10-CM

## 2023-04-07 DIAGNOSIS — Z7985 Long-term (current) use of injectable non-insulin antidiabetic drugs: Secondary | ICD-10-CM | POA: Diagnosis not present

## 2023-04-07 DIAGNOSIS — R0982 Postnasal drip: Secondary | ICD-10-CM

## 2023-04-07 LAB — POCT GLYCOSYLATED HEMOGLOBIN (HGB A1C): Hemoglobin A1C: 5.4 % (ref 4.0–5.6)

## 2023-04-07 LAB — MICROALBUMIN / CREATININE URINE RATIO
Creatinine,U: 187.6 mg/dL
Microalb Creat Ratio: 29.5 mg/g (ref 0.0–30.0)
Microalb, Ur: 5.5 mg/dL — ABNORMAL HIGH (ref 0.0–1.9)

## 2023-04-07 LAB — HM MAMMOGRAPHY

## 2023-04-07 MED ORDER — IBUPROFEN 800 MG PO TABS: 800.0000 mg | ORAL_TABLET | Freq: Three times a day (TID) | ORAL | 1 refills | Status: AC | PRN

## 2023-04-07 NOTE — Progress Notes (Signed)
 Patient ID: Leah [name], female    DOB: 1960-12-29, 63 y.o.   MRN: [id number]   Assessment & Plan:  Neuroendocrine tumor of liver  Type 2 diabetes mellitus with other specified complication, without long-term current use of insulin [facility]) Assessment & Plan: Lab Results  Component Value Date   HGBA1C 5.4 04/07/2023   Well-controlled. Doing well with Mounjaro.  Plan is to continue on metformin 500 mg twice daily.  Orders: -     POCT glycosylated hemoglobin (Hb A1C) -     Microalbumin / creatinine urine ratio  Need for pneumococcal vaccine -     Pneumococcal conjugate vaccine 20-valent  Screening for colon cancer -     Ambulatory referral to Gastroenterology  Generalized abdominal pain -     Ibuprofen; Take 1 tablet (800 mg total) by mouth every 8 (eight) hours as needed for moderate pain (pain score 4-6) or cramping. Take with food.  Dispense: 30 tablet; Refill: 1  Onychomycosis  Postnasal drip  Nausea      Chronic Sinusitis Persistent symptoms despite use of Flonase and humidifier. Possible postnasal drip contributing to symptoms. -Continue Flonase and humidifier use. -Consider adding nasal saline.  Nausea Persistent despite Zofran use. History of radiation treatment. -Continue Zofran as needed. -Consider alternative antiemetics if symptoms persist.  Liver Lesions / neuroendocrine tumor History of growth and associated pain. Recent MRI scheduled. -Continue monitoring with imaging as directed by oncologist. -Reviewed Dr. Gustavo Lah recent chart.   Foot Fungus Recurrence after treatment six months ago. Limited treatment options due to overall health status. -Consider reevaluation by podiatrist for additional treatment options.  Abdominal Pain Described as crampy, similar to menstrual cramps. No current pain management regimen. Could be related to her tumors.  -Consider trial of Ibuprofen 800mg  as needed for pain, pending approval from oncologist.  General  Health Maintenance -Scheduled for colonoscopy due to age. -Annual urine albumin screening to be performed today.        Subjective:    Chief Complaint  Patient presents with   Medical Management of Chronic Issues    Pt in office to discuss recent oncology visits with PCP; pt says started feeling sicker and states tumors have got bigger, pain on side as well; past 3 days nauseous and zofran isn't helping.      HPI Discussed the use of AI scribe software for clinical note transcription with the patient, who gave verbal consent to proceed.  History of Present Illness   Leah [name] is a 63 year old female who presents with persistent sinus symptoms and nausea. She lives with her husband and four-year-old grandson, who has mild autism.  She experiences ongoing sinus issues, including laryngitis, postnasal drip, and sinus drainage. These symptoms are intermittent, with periods of relief followed by recurrence, and are exacerbated by environmental factors such as dry air from heating systems. She uses Flonase and nasal saline for management.  She has persistent nausea, which she attributes to not taking Zofran. She recalls effective management with intravenous Zofran during previous treatments. Her current nausea is unrelieved by usual medications.  She reports pain in the liver area, similar to previous episodes when she was sensitive to changes in liver size. Stomach pain persists without resolution, initially thought to be related to constipation. She is accustomed to relief from medications like Zofran, but notes current ineffectiveness.  She describes a recurring issue with her feet, previously treated by a podiatrist with laser therapy and a topical treatment. The condition  improved but has since returned, affecting her nails and causing discomfort.  She mentions a history of cancer treatment, including a PET scan in January, and expresses concern about the progression of her  condition. Previous discussions about the frequency of her liver MRIs, which were canceled, have caused her distress.  She is actively involved in the care of her grandson, including enrolling him in a preschool program with speech and occupational therapy, noting significant improvements in his communication skills.       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   Constipation    Diabetes (HCC)    Dyspnea    with exertion   Fatty liver    Gallbladder problem    GE reflux    Hepatic encephalopathy (HCC) 2011   History of PCR DNA positive for HSV1    History of retinal vein occlusion, right eye    HTN (hypertension) 2011   Joint pain    Liver problem    Neuroendocrine tumor 2011   Orthostatic hypotension    Osteoarthritis    Osteopenia    Palpitations    Sarcoidosis 2008   Sleep apnea    uses cpap   Surgical menopause     Past Surgical History:  Procedure Laterality Date   ABDOMINAL HYSTERECTOMY  2004   AUGMENTATION MAMMAPLASTY     BREAST BIOPSY Left    BREAST RECONSTRUCTION Bilateral 05/07/2020   Procedure: Revision of bilateral breast reconstruction;  Surgeon: Allena Napoleon, MD;  Location: [facility];  Service: Plastics;  Laterality: Bilateral;  2 hours   CESAREAN SECTION  [id number]   CHOLECYSTECTOMY  2006   HERNIA REPAIR     KNEE ARTHROSCOPY Left 09/2010   LAPAROSCOPIC HEPATECTOMY     LIPOSUCTION Bilateral 05/07/2020   Procedure: LIPOSUCTION with excision of lateral breast and axillary tissue;  Surgeon: Allena Napoleon, MD;  Location: [facility];  Service: Plastics;  Laterality: Bilateral;   LIVER SURGERY     MASTECTOMY Bilateral 2008   NASAL SINUS SURGERY     OOPHORECTOMY  2004   RECONSTRUCTION BREAST W/ LATISSIMUS DORSI FLAP Bilateral    TONSILLECTOMY  1976   TOTAL  KNEE ARTHROPLASTY Left 10/29/2020   Procedure: TOTAL KNEE ARTHROPLASTY;  Surgeon: Ollen Gross, MD;  Location: [facility];  Service: Orthopedics;  Laterality: Left;   TOTAL KNEE ARTHROPLASTY Right 04/14/2022   Procedure: TOTAL KNEE ARTHROPLASTY;  Surgeon: Ollen Gross, MD;  Location: [facility];  Service: Orthopedics;  Laterality: Right;   TUBAL LIGATION Bilateral 2001    Family History  Problem Relation Age of Onset   Arthritis Maternal Grandmother    Arthritis Maternal Grandfather    Arthritis Paternal Grandmother    Arthritis Paternal Grandfather    Heart failure Paternal Grandfather    Breast cancer Mother 65   Lupus Mother    Heart failure Father    Hypertension Father    Hyperlipidemia Father    Sudden death Father    Obesity Father    Breast cancer Maternal Aunt 21   Ovarian cancer Maternal Aunt 6   Breast cancer Maternal Aunt 46    Social History   Tobacco Use   Smoking status: Never   Smokeless tobacco: Never   Tobacco comments:    never  used tobacco  Vaping Use   Vaping status: Never Used  Substance Use Topics   Alcohol use: No    Alcohol/week: 0.0 standard drinks of alcohol   Drug use: No     No Known Allergies  Review of Systems NEGATIVE UNLESS OTHERWISE INDICATED IN HPI      Objective:     BP 136/78 (BP Location: Left Arm, Patient Position: Sitting, Cuff Size: Normal)   Pulse 100   Temp (!) 97.2 F (36.2 C) (Temporal)   Ht 4\' 11"  (1.499 m)   Wt 170 lb (77.1 kg)   SpO2 98%   BMI 34.34 kg/m   Wt Readings from Last 3 Encounters:  04/07/23 170 lb (77.1 kg)  03/25/23 171 lb 12.8 oz (77.9 kg)  03/10/23 169 lb 4 oz (76.8 kg)    BP Readings from Last 3 Encounters:  04/07/23 136/78  03/25/23 129/82  03/18/23 135/74     Physical Exam Vitals and nursing note reviewed.  Constitutional:      General: She is not in acute distress.    Appearance: Normal appearance. She is obese. She is not ill-appearing.  HENT:     Head: Normocephalic.      Right Ear: External ear normal.     Left Ear: External ear normal.     Nose: Nose normal.     Mouth/Throat:     Mouth: Mucous membranes are moist.  Eyes:     Extraocular Movements: Extraocular movements intact.     Conjunctiva/sclera: Conjunctivae normal.     Pupils: Pupils are equal, round, and reactive to light.  Cardiovascular:     Rate and Rhythm: Normal rate and regular rhythm.     Pulses: Normal pulses.     Heart sounds: No murmur heard. Pulmonary:     Effort: Pulmonary effort is normal.     Breath sounds: Normal breath sounds.  Musculoskeletal:        General: Normal range of motion.     Cervical back: Normal range of motion.     Right hip: Normal strength.     Left hip: Normal strength.  Skin:    General: Skin is warm.     Comments: Onychomycosis noted bilateral feet all toenails  Neurological:     General: No focal deficit present.     Mental Status: She is alert and oriented to person, place, and time.     Gait: Gait normal.  Psychiatric:        Mood and Affect: Mood normal.        Behavior: Behavior normal.      Giorgi Debruin M Bijal Siglin, PA-C

## 2023-04-07 NOTE — Patient Instructions (Addendum)
 -  CHRONIC SINUSITIS: Chronic sinusitis is a condition where the cavities around your nasal passages become inflamed and swollen for an extended period. Continue using Flonase and a humidifier, and consider adding nasal saline to help manage your symptoms.  -NAUSEA: Nausea is a feeling of sickness with an inclination to vomit. Continue taking Zofran as needed, and we may consider alternative medications if your symptoms do not improve.  -FOOT FUNGUS: Foot fungus is a fungal infection that affects the skin and nails of the feet. Consider seeing your podiatrist again for additional treatment options.  -ABDOMINAL PAIN: Abdominal pain can have various causes, including digestive issues. We suggest trying Ibuprofen 800mg  as needed for pain, but please get approval from your oncologist first.  -GENERAL HEALTH MAINTENANCE: You are scheduled for a colonoscopy due to your age, and we will perform your annual urine albumin screening today. Your Prevnar vaccine was updated today.  Your diabetes is currently well controlled.  Lab Results  Component Value Date   HGBA1C 5.4 04/07/2023   HGBA1C 5.4 04/02/2022   HGBA1C 5.8 03/11/2022

## 2023-04-07 NOTE — Assessment & Plan Note (Signed)
 Lab Results  Component Value Date   HGBA1C 5.4 04/07/2023   Well-controlled. Doing well with Mounjaro.  Plan is to continue on metformin 500 mg twice daily.

## 2023-04-09 ENCOUNTER — Encounter: Payer: Self-pay | Admitting: Physician Assistant

## 2023-04-12 ENCOUNTER — Other Ambulatory Visit: Payer: Self-pay | Admitting: Physician Assistant

## 2023-04-21 ENCOUNTER — Encounter (HOSPITAL_COMMUNITY)
Admission: RE | Admit: 2023-04-21 | Disposition: A | Discharge status: Home / Self Care | Payer: BC Managed Care – PPO | Source: Ambulatory Visit | Attending: Hematology & Oncology | Admitting: Hematology & Oncology

## 2023-04-21 ENCOUNTER — Other Ambulatory Visit: Payer: Self-pay | Admitting: Hematology & Oncology

## 2023-04-21 ENCOUNTER — Other Ambulatory Visit: Payer: Self-pay | Admitting: Physician Assistant

## 2023-04-22 ENCOUNTER — Inpatient Hospital Stay: Payer: BC Managed Care – PPO

## 2023-04-22 ENCOUNTER — Inpatient Hospital Stay: Payer: BC Managed Care – PPO | Attending: Hematology & Oncology

## 2023-04-22 ENCOUNTER — Inpatient Hospital Stay (HOSPITAL_BASED_OUTPATIENT_CLINIC_OR_DEPARTMENT_OTHER): Payer: BC Managed Care – PPO | Admitting: Family

## 2023-04-22 VITALS — Wt 171.8 lb

## 2023-04-22 DIAGNOSIS — C7A00 Malignant carcinoid tumor of unspecified site: Secondary | ICD-10-CM | POA: Insufficient documentation

## 2023-04-22 DIAGNOSIS — C7B8 Other secondary neuroendocrine tumors: Secondary | ICD-10-CM

## 2023-04-22 DIAGNOSIS — C7B02 Secondary carcinoid tumors of liver: Secondary | ICD-10-CM | POA: Diagnosis not present

## 2023-04-22 DIAGNOSIS — R978 Other abnormal tumor markers: Secondary | ICD-10-CM | POA: Insufficient documentation

## 2023-04-22 LAB — CBC WITH DIFFERENTIAL (CANCER CENTER ONLY)
Abs Immature Granulocytes: 0.03 10*3/uL (ref 0.00–0.07)
Basophils Absolute: 0.1 10*3/uL (ref 0.0–0.1)
Basophils Relative: 1 %
Eosinophils Absolute: 0.3 10*3/uL (ref 0.0–0.5)
Eosinophils Relative: 3 %
HCT: 38.3 % (ref 36.0–46.0)
Hemoglobin: 12.5 g/dL (ref 12.0–15.0)
Immature Granulocytes: 0 %
Lymphocytes Relative: 18 %
Lymphs Abs: 1.7 10*3/uL (ref 0.7–4.0)
MCH: 29.7 pg (ref 26.0–34.0)
MCHC: 32.6 g/dL (ref 30.0–36.0)
MCV: 91 fL (ref 80.0–100.0)
Monocytes Absolute: 0.7 10*3/uL (ref 0.1–1.0)
Monocytes Relative: 8 %
Neutro Abs: 6.6 10*3/uL (ref 1.7–7.7)
Neutrophils Relative %: 70 %
Platelet Count: 213 10*3/uL (ref 150–400)
RBC: 4.21 MIL/uL (ref 3.87–5.11)
RDW: 14.2 % (ref 11.5–15.5)
WBC Count: 9.3 10*3/uL (ref 4.0–10.5)
nRBC: 0 % (ref 0.0–0.2)

## 2023-04-22 LAB — CMP (CANCER CENTER ONLY)
ALT: 11 U/L (ref 0–44)
AST: 12 U/L — ABNORMAL LOW (ref 15–41)
Albumin: 4.3 g/dL (ref 3.5–5.0)
Alkaline Phosphatase: 76 U/L (ref 38–126)
Anion gap: 9 (ref 5–15)
BUN: 19 mg/dL (ref 8–23)
CO2: 27 mmol/L (ref 22–32)
Calcium: 9.3 mg/dL (ref 8.9–10.3)
Chloride: 104 mmol/L (ref 98–111)
Creatinine: 0.65 mg/dL (ref 0.44–1.00)
GFR, Estimated: >60 mL/min (ref >60)
Glucose, Bld: 98 mg/dL (ref 70–99)
Potassium: 4.3 mmol/L (ref 3.5–5.1)
Sodium: 140 mmol/L (ref 135–145)
Total Bilirubin: 0.5 mg/dL (ref 0.0–1.2)
Total Protein: 7.7 g/dL (ref 6.5–8.1)

## 2023-04-22 MED ORDER — LANREOTIDE ACETATE 120 MG/0.5ML ~~LOC~~ SOLN
120.0000 mg | Freq: Once | SUBCUTANEOUS | Status: AC
Start: 2023-04-22 — End: 2023-04-22
  Administered 2023-04-22: 120 mg via SUBCUTANEOUS
  Filled 2023-04-22: qty 120

## 2023-04-22 NOTE — Patient Instructions (Signed)
 Lanreotide Injection What is this medication? LANREOTIDE (lan REE oh tide) treats high levels of growth hormone (acromegaly). It is used when other therapies have not worked well enough or cannot be tolerated. It works by reducing the amount of growth hormone your body makes. This reduces symptoms and the risk of health problems caused by too much growth hormone, such as diabetes and heart disease. It may also be used to treat neuroendocrine tumors, a cancer of the cells that release hormones and other substances in your body. It works by slowing down the release of these substances from the cells. This slows tumor growth. It also decreases the symptoms of carcinoid syndrome, such as flushing or diarrhea. This medicine may be used for other purposes; ask your health care provider or pharmacist if you have questions. COMMON BRAND NAME(S): Somatuline [place] What should I tell my care team before I take this medication? They need to know if you have any of these conditions: Diabetes Gallbladder disease Heart disease Kidney disease Liver disease Pancreatic disease Thyroid disease An unusual or allergic reaction to lanreotide, other medications, foods, dyes, or preservatives Pregnant or trying to get pregnant Breastfeeding How should I use this medication? This medication is injected under the skin. It is given by your care team in a hospital or clinic setting. Talk to your care team about the use of this medication in children. Special care may be needed. Overdosage: If you think you have taken too much of this medicine contact a poison control center or emergency room at once. NOTE: This medicine is only for you. Do not share this medicine with others. What if I miss a dose? Keep appointments for follow-up doses. It is important not to miss your dose. Call your care team if you are unable to keep an appointment. What may interact with this medication? Bromocriptine Cyclosporine Certain  medications for blood pressure, heart disease, irregular heartbeat Certain medications for diabetes Quinidine Terfenadine This list may not describe all possible interactions. Give your health care provider a list of all the medicines, herbs, non-prescription drugs, or dietary supplements you use. Also tell them if you smoke, drink alcohol, or use illegal drugs. Some items may interact with your medicine. What should I watch for while using this medication? Visit your care team for regular checks on your progress. Tell your care team if your symptoms do not start to get better or if they get worse. Your condition will be monitored carefully while you are receiving this medication. You may need blood work while you are taking this medication. This medication may increase blood sugar. The risk may be higher in patients who already have diabetes. Ask your care team what you can do to lower your risk of diabetes while taking this medication. Talk to your care team if you wish to become pregnant or think you may be pregnant. This medication can cause serious birth defects. Do not breast-feed while taking this medication and for 6 months after stopping therapy. This medication may cause infertility. Talk to your care team if you are concerned about your fertility. What side effects may I notice from receiving this medication? Side effects that you should report to your care team as soon as possible: Allergic reactions--skin rash, itching, hives, swelling of the face, lips, tongue, or throat Gallbladder problems--severe stomach pain, nausea, vomiting, fever High blood sugar (hyperglycemia)--increased thirst or amount of urine, unusual weakness or fatigue, blurry vision Increase in blood pressure Low blood sugar (hypoglycemia)--pale, blue or purple  skin or lips, sweating, fussiness, rapid heartbeat, poor feeding, low body temperature Low thyroid levels (hypothyroidism)--unusual weakness or fatigue,  increased sensitivity to cold, constipation, hair loss, dry skin, weight gain, feelings of depression Oily or light-colored stools, diarrhea, bloating, weight loss Slow heartbeat--dizziness, feeling faint or lightheaded, confusion, trouble breathing, unusual weakness or fatigue Side effects that usually do not require medical attention (report these to your care team if they continue or are bothersome): Diarrhea Dizziness Headache Muscle spasms Nausea Pain, redness, or irritation at injection site Stomach pain This list may not describe all possible side effects. Call your doctor for medical advice about side effects. You may report side effects to FDA at 1-800-FDA-1088. Where should I keep my medication? This medication is given in a hospital or clinic. It will not be stored at home. NOTE: This sheet is a summary. It may not cover all possible information. If you have questions about this medicine, talk to your doctor, pharmacist, or health care provider.  2024 Elsevier/Gold Standard (2023-01-09 00:00:00)

## 2023-04-22 NOTE — Progress Notes (Signed)
 Hematology and Oncology Follow Up Visit  Leah [name] [id number] [birth year] 63 y.o. 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 She has had some nausea at times and taker Zofran as needed.  Chronic fatigue.  She had her first Lutathera treatment last month and Chromogranin A is coming down nicely.  She will follow-up with Dr. Deveron Furlong at [facility] in June and will have her follow-up MRI at that time.  She notes occasional episodes of dizziness and states this feels due to old diagnosis of POTS.  She has also had some hair shedding.  No fever, chills, cough, rash, chest pain, palpitations or changes in bowel or bladder habits.  Right upper quadrant pain comes and goes. She has 800 mg Advil to take PRN which helps. No swelling in her extremities or tingling at this time.  No falls or syncope reported.  Appetite and hydration are improved. Weight stable at 171 lbs.    ECOG Performance Status: 1 - Symptomatic but completely ambulatory  Medications:  Allergies as of 04/22/2023   No Known Allergies      Medication List        Accurate as of April 22, 2023 10:20 AM. If you have any questions, ask your nurse or doctor.          albuterol 108 (90 Base) MCG/ACT inhaler Commonly known as: VENTOLIN HFA Inhale 2 puffs into the lungs every 6 (six) hours as needed for wheezing or shortness of breath.   amLODipine 10 MG tablet Commonly known as: NORVASC TAKE 1 TABLET BY MOUTH EVERY DAY   clonazePAM 0.5 MG tablet Commonly known as: KLONOPIN TAKE 1 TABLET BY MOUTH TWICE A DAY   docusate sodium 100 MG capsule Commonly known as: COLACE TAKE 1 CAPSULE BY MOUTH TWICE A DAY   fexofenadine  180 MG tablet Commonly known as: ALLEGRA TAKE 1 TABLET BY MOUTH EVERY DAY   gabapentin 100 MG capsule Commonly known as: NEURONTIN Take 1 capsule (100 mg total) by mouth at bedtime.   ibuprofen 800 MG tablet Commonly known as: ADVIL Take 1 tablet (800 mg total) by mouth every 8 (eight) hours as needed for moderate pain (pain score 4-6) or cramping. Take with food.   metFORMIN 500 MG tablet Commonly known as: GLUCOPHAGE Take 1 tablet (500 mg total) by mouth 2 (two) times daily with a meal.   methocarbamol 500 MG tablet Commonly known as: ROBAXIN Take 1 tablet (500 mg total) by mouth every 6 (six) hours as needed for muscle spasms.   metoprolol succinate 100 MG 24 hr tablet Commonly known as: TOPROL-XL TAKE 1 TABLET BY MOUTH EVERY DAY EVERY EVENING WITH OR IMMEDIATELY FOLLOWING A MEAL   montelukast 10 MG tablet Commonly known as: SINGULAIR TAKE 1 TABLET BY MOUTH EVERYDAY AT BEDTIME   Mounjaro 2.5 MG/0.5ML Pen Generic drug: tirzepatide Inject 2.5 mg into the skin once a week.   ondansetron 4 MG disintegrating tablet Commonly known as: ZOFRAN-ODT TAKE 1 TABLET BY MOUTH EVERY 8 HOURS AS NEEDED *INS MAX 18 TABS PER 21 DAYS*   pantoprazole 40 MG tablet Commonly known as: PROTONIX TAKE 1 TABLET BY MOUTH TWICE A DAY   rosuvastatin 10 MG tablet Commonly  known as: CRESTOR TAKE 1 TABLET BY MOUTH EVERY DAY   SANDOSTATIN IJ Inject 1 Dose as directed every 30 (thirty) days.   scopolamine 1 MG/3DAYS Commonly known as: TRANSDERM-SCOP Place 1 patch (1.5 mg total) onto the skin every 3 (three) days.   valsartan 320 MG tablet Commonly known as: DIOVAN TAKE 1 TABLET BY MOUTH EVERY DAY   venlafaxine XR 150 MG 24 hr capsule Commonly known as: EFFEXOR-XR TAKE 1 CAPSULE BY MOUTH EVERY DAY IN THE MORNING        Allergies: No Known Allergies  Past Medical History, Surgical history, Social history, and Family History were reviewed and updated.  Review of Systems: All other 10  point review of systems is negative.   Physical Exam:  weight is 171 lb 12.8 oz (77.9 kg).   Wt Readings from Last 3 Encounters:  04/22/23 171 lb 12.8 oz (77.9 kg)  04/07/23 170 lb (77.1 kg)  03/25/23 171 lb 12.8 oz (77.9 kg)    Ocular: Sclerae unicteric, pupils equal, round and reactive to light Ear-nose-throat: Oropharynx clear, dentition fair Lymphatic: No cervical or supraclavicular adenopathy Lungs no rales or rhonchi, good excursion bilaterally Heart regular rate and rhythm, no murmur appreciated Abd soft, nontender, positive bowel sounds MSK no focal spinal tenderness, no joint edema Neuro: non-focal, well-oriented, appropriate affect Breasts: Deferred   Lab Results  Component Value Date   WBC 9.3 04/22/2023   HGB 12.5 04/22/2023   HCT 38.3 04/22/2023   MCV 91.0 04/22/2023   PLT 213 04/22/2023   Lab Results  Component Value Date   FERRITIN 382 (H) 10/18/2008   IRON <10 (L) 10/18/2008   TIBC NOT CALC Not calculated due to Iron <10. 10/18/2008   UIBC 156 10/18/2008   IRONPCTSAT NOT CALC Not calculated due to Iron <10. 10/18/2008   Lab Results  Component Value Date   RETICCTPCT 0.9 10/18/2008   RBC 4.21 04/22/2023   No results found for: "KPAFRELGTCHN", "LAMBDASER", "KAPLAMBRATIO" No results found for: "IGGSERUM", "IGA", "IGMSERUM" No results found for: "TOTALPROTELP", "ALBUMINELP", "A1GS", "A2GS", "BETS", "BETA2SER", "GAMS", "MSPIKE", "SPEI"   Chemistry      Component Value Date/Time   NA 141 03/25/2023 0755   NA 140 01/20/2017 1416   NA 141 01/17/2016 1047   K 4.7 03/25/2023 0755   K 3.9 01/20/2017 1416   K 3.2 (L) 01/17/2016 1047   CL 105 03/25/2023 0755   CL 102 01/20/2017 1416   CO2 27 03/25/2023 0755   CO2 26 01/20/2017 1416   CO2 27 01/17/2016 1047   BUN 12 03/25/2023 0755   BUN 15 01/20/2017 1416   BUN 6.6 (L) 01/17/2016 1047   CREATININE 0.58 03/25/2023 0755   CREATININE 1.1 01/20/2017 1416   CREATININE 0.7 01/17/2016 1047      Component  Value Date/Time   CALCIUM 9.5 03/25/2023 0755   CALCIUM 9.1 01/20/2017 1416   CALCIUM 8.7 01/17/2016 1047   ALKPHOS 64 03/25/2023 0755   ALKPHOS 84 01/20/2017 1416   ALKPHOS 90 01/17/2016 1047   AST 12 (L) 03/25/2023 0755   AST 34 01/17/2016 1047   ALT 8 03/25/2023 0755   ALT 35 01/20/2017 1416   ALT 38 01/17/2016 1047   BILITOT 0.5 03/25/2023 0755   BILITOT 0.42 01/17/2016 1047       Impression and Plan: Leah [name] is a very pleasant 63 yo caucasian female with long standing history of metastatic carcinoid with recurrent liver metastasis. She had progressive disease and is now on Lutathera.  First dose given 03/18/2023.  Her next Lutathera will be 05/12/2023. Chromogranin A at last appointment was 78 (down from 111). Today's result is pending.  We will proceed with Somatuline today as planned.  Follow-up in 1 month.   Eileen Stanford, NP 3/12/202510:20 AM

## 2023-04-23 ENCOUNTER — Encounter: Payer: Self-pay | Admitting: *Deleted

## 2023-04-23 DIAGNOSIS — E1169 Type 2 diabetes mellitus with other specified complication: Secondary | ICD-10-CM | POA: Diagnosis not present

## 2023-04-23 DIAGNOSIS — E66811 Obesity, class 1: Secondary | ICD-10-CM | POA: Diagnosis not present

## 2023-04-23 DIAGNOSIS — F439 Reaction to severe stress, unspecified: Secondary | ICD-10-CM | POA: Diagnosis not present

## 2023-04-23 DIAGNOSIS — E1159 Type 2 diabetes mellitus with other circulatory complications: Secondary | ICD-10-CM | POA: Diagnosis not present

## 2023-04-24 LAB — CHROMOGRANIN A: Chromogranin A (ng/mL): 107.3 ng/mL — ABNORMAL HIGH (ref 0.0–101.8)

## 2023-04-28 DIAGNOSIS — H3581 Retinal edema: Secondary | ICD-10-CM | POA: Diagnosis not present

## 2023-04-28 DIAGNOSIS — H35033 Hypertensive retinopathy, bilateral: Secondary | ICD-10-CM | POA: Diagnosis not present

## 2023-04-28 DIAGNOSIS — D869 Sarcoidosis, unspecified: Secondary | ICD-10-CM | POA: Diagnosis not present

## 2023-04-28 DIAGNOSIS — H34831 Tributary (branch) retinal vein occlusion, right eye, with macular edema: Secondary | ICD-10-CM | POA: Diagnosis not present

## 2023-05-05 ENCOUNTER — Encounter (INDEPENDENT_AMBULATORY_CARE_PROVIDER_SITE_OTHER): Payer: Self-pay

## 2023-05-06 ENCOUNTER — Encounter: Payer: Self-pay | Admitting: Internal Medicine

## 2023-05-11 NOTE — Written Directive (Cosign Needed)
 LUTATHERA THERAPY   RADIOPHARMACEUTICAL:  Lutetium 177 Dotatate (Lutathera)     PRESCRIBED DOSE FOR ADMINISTRATION:  200 mCi   ROUTE OFADMINISTRATION:  IV   DIAGNOSIS:  Progressive neuroendocrine carcinoma. 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   REFERRING PHYSICIAN: Josph Macho, MD   TREATMENT #: 2   DATE OF LAST LONG LIVED SOMATOSTATIN INJECTION: 03/18/2023   ADDITIONAL PHYSICIAN COMMENTS/NOTES:   AUTHORIZED USER SIGNATURE & TIME STAMP: Patriciaann Clan, MD   05/12/23    8:40 AM

## 2023-05-12 ENCOUNTER — Ambulatory Visit (HOSPITAL_COMMUNITY)
Admission: RE | Admit: 2023-05-12 | Discharge: 2023-05-12 | Disposition: A | Discharge status: Home / Self Care | Payer: BC Managed Care – PPO | Source: Ambulatory Visit | Attending: Hematology & Oncology | Admitting: Hematology & Oncology

## 2023-05-12 VITALS — BP 133/67 | HR 74

## 2023-05-12 DIAGNOSIS — C7B8 Other secondary neuroendocrine tumors: Secondary | ICD-10-CM | POA: Insufficient documentation

## 2023-05-12 DIAGNOSIS — D3A8 Other benign neuroendocrine tumors: Secondary | ICD-10-CM | POA: Insufficient documentation

## 2023-05-12 DIAGNOSIS — C220 Liver cell carcinoma: Secondary | ICD-10-CM | POA: Diagnosis not present

## 2023-05-12 DIAGNOSIS — C7A8 Other malignant neuroendocrine tumors: Secondary | ICD-10-CM | POA: Diagnosis not present

## 2023-05-12 MED ORDER — SODIUM CHLORIDE 0.9 % IV SOLN
500.0000 mL | Freq: Once | INTRAVENOUS | Status: AC
  Administered 2023-05-12: 500 mL via INTRAVENOUS

## 2023-05-12 MED ORDER — SODIUM CHLORIDE 0.9 % IV SOLN
8.0000 mg | Freq: Once | INTRAVENOUS | Status: AC
  Administered 2023-05-12: 8 mg via INTRAVENOUS
  Filled 2023-05-12: qty 4

## 2023-05-12 MED ORDER — LUTETIUM LU 177 DOTATATE 370 MBQ/ML IV SOLN: 200.0000 | Freq: Once | INTRAVENOUS | Status: DC

## 2023-05-12 MED ORDER — PROCHLORPERAZINE EDISYLATE 10 MG/2ML IJ SOLN: 10.0000 mg | Freq: Four times a day (QID) | INTRAMUSCULAR | Status: DC | PRN

## 2023-05-12 MED ORDER — OCTREOTIDE ACETATE 500 MCG/ML IJ SOLN: 500.0000 ug | Freq: Once | INTRAMUSCULAR | Status: DC | PRN

## 2023-05-12 MED ORDER — AMINO ACID RADIOPROTECTANT - L-LYSINE 2.5%/L-ARGININE 2.5% IN NS
250.0000 mL/h | INTRAVENOUS | Status: AC
  Administered 2023-05-12: 250 mL/h via INTRAVENOUS
  Filled 2023-05-12: qty 1000

## 2023-05-12 MED ORDER — OCTREOTIDE ACETATE 30 MG IM KIT
PACK | INTRAMUSCULAR | Status: AC
Start: 2023-05-12 — End: ?
  Filled 2023-05-12: qty 1

## 2023-05-12 MED ORDER — OCTREOTIDE ACETATE 30 MG IM KIT
30.0000 mg | PACK | Freq: Once | INTRAMUSCULAR | Status: AC
  Administered 2023-05-12: 30 mg via INTRAMUSCULAR

## 2023-05-12 NOTE — Progress Notes (Signed)
 LINICAL DATA: [age]] year-old [female] with metastatic neuroendocrine tumor. Well differentiated tumor with somatostatin receptor is identified within the [liver] by DOTATATE PET CT scan.  EXAM: NUCLEAR MEDICINE LUTATHERA ADMINISTRATION  TECHNIQUE: Infusion: The nuclear medicine technologist verified the dose activity ([204] mCi) to be delivered as specified in the written directive (200 mCi), and verified the patient identification via 2 separate methods.  20 gauge IV were started in the antecubital veins. Anti-emetics were administered by nursing staff. Amino acid renal protection was initiated 30 minutes prior to Lu 177 DOTATATE (Lutathera) infusion and continued continuously for 4 hours. Lutathera infusion was administered over 30 minutes.      The total administered dose was [202.9] mCi Lu 177 DOTATATE.    The entire IV tubing, venocatheter, stopcock and syringes was removed in total, placed in a disposal bag and sent for assay of the residual activity, which will be reported at a later time in our EMR by the physics staff. Pressure was applied to the venipuncture sites, and a compression bandage placed. Radiation Safety personnel were present to perform the discharge survey, as detailed on their documentation.    Patient received 30 mg IM long-acting Sandostatin injection 4 hours after Lutathera effusion in the nuclear medicine department.   RADIOPHARMACEUTICALS:   [202.9] mCi Lu 177 DOTATATE   FINDINGS: Diagnosis: [Metastatic neuroendocrine tumor.]    Current Infusion: [2]    Planned Infusions: [4]    Patient reports [minimal] interval symptoms following therapy.  Mild nodule following first therapy and mild fatigue.  Mild diarrhea symptoms persist.      Patient's CBC, renal function hepatic function remain normal.      No change in chromogranin level.    The patient's most recent blood counts were reviewed and remains a good candidate to proceed with  Lutathera. The patient was situated in an infusion suite and administered Lutathera as above. Patient will follow-up with referring oncologist for interval serum laboratories (CBC and CMP) in approximately 4 weeks.       Patient received 30 mg IM long-acting Sandostatin injection 4 hours after Lutathera effusion in the nuclear medicine department.     IMPRESSION: [Second] Lu 177 DOTATATE treatment for metastatic neuroendocrine tumor. The patient tolerated the infusion well. The patient will return in 8 weeks for ongoing care.

## 2023-05-19 ENCOUNTER — Other Ambulatory Visit: Payer: Self-pay | Admitting: Physician Assistant

## 2023-05-19 DIAGNOSIS — J301 Allergic rhinitis due to pollen: Secondary | ICD-10-CM

## 2023-05-20 ENCOUNTER — Inpatient Hospital Stay: Admitting: Hematology & Oncology

## 2023-05-20 ENCOUNTER — Inpatient Hospital Stay

## 2023-05-20 ENCOUNTER — Inpatient Hospital Stay: Attending: Hematology & Oncology

## 2023-05-20 DIAGNOSIS — C7B02 Secondary carcinoid tumors of liver: Secondary | ICD-10-CM | POA: Insufficient documentation

## 2023-05-20 DIAGNOSIS — C7A00 Malignant carcinoid tumor of unspecified site: Secondary | ICD-10-CM | POA: Insufficient documentation

## 2023-05-27 ENCOUNTER — Inpatient Hospital Stay

## 2023-05-27 ENCOUNTER — Inpatient Hospital Stay: Admitting: Hematology & Oncology

## 2023-05-30 ENCOUNTER — Other Ambulatory Visit: Payer: Self-pay | Admitting: Physician Assistant

## 2023-06-03 NOTE — Progress Notes (Unsigned)
 [name] D.[facility] [address] [place] [address] Phone: [phone]   Assessment and Plan:     There are no diagnoses linked to this encounter.  ***   Pertinent previous records reviewed include ***    Follow Up: ***     Subjective:   I, Leah [name], am serving as a Neurosurgeon for Doctor Leah [name]   Chief Complaint: right knee injection, bilateral thumb pain    HPI:    06/26/21 Patient is a 63 year old female complaining of right knee pain and bilateral thumb pain. Patient states that sometimes they wont bend at all its a click up and click down , been chronic hand pain but he clicking and locking has been going on for a couple of weeks, hx of right thumb fx , does get numbness and tingling, thumbs have been waking her up during the night , has been taking tylenol  and using Voltaren  gel and gabapentin ,    09/25/2021 Patient states that a few weeks ago her knee was awful but now it is feeling better , hands are good , wants injection    03/03/2022 Patient states thumb pain is back , right knee is hurting a lot want CSI today    07/16/2022 Patient states CSI thumbs , fell a couple of weeks ago    06/04/2023 Patient states   Relevant Historical Information: History of stage IV liver cancer s/p resection, total left knee replacement, DM type II  Additional pertinent review of systems negative.   Current Outpatient Medications:    albuterol  (VENTOLIN  HFA) 108 (90 Base) MCG/ACT inhaler, Inhale 2 puffs into the lungs every 6 (six) hours as needed for wheezing or shortness of breath., Disp: 8 g, Rfl: 0   amLODipine  (NORVASC ) 10 MG tablet, TAKE 1 TABLET BY MOUTH EVERY DAY, Disp: 90 tablet, Rfl: 3   clonazePAM  (KLONOPIN ) 0.5 MG tablet, TAKE 1 TABLET BY MOUTH TWICE A DAY, Disp: 60 tablet, Rfl: 0   docusate sodium  (COLACE) 100 MG capsule, TAKE 1 CAPSULE BY MOUTH TWICE A DAY, Disp: 30 capsule, Rfl: 0   fexofenadine  (ALLEGRA )  180 MG tablet, TAKE 1 TABLET BY MOUTH EVERY DAY, Disp: 90 tablet, Rfl: 3   gabapentin  (NEURONTIN ) 100 MG capsule, Take 1 capsule (100 mg total) by mouth at bedtime., Disp: 90 capsule, Rfl: 3   ibuprofen  (ADVIL ) 800 MG tablet, Take 1 tablet (800 mg total) by mouth every 8 (eight) hours as needed for moderate pain (pain score 4-6) or cramping. Take with food., Disp: 30 tablet, Rfl: 1   metFORMIN  (GLUCOPHAGE ) 500 MG tablet, Take 1 tablet (500 mg total) by mouth 2 (two) times daily with a meal., Disp: 180 tablet, Rfl: 3   methocarbamol  (ROBAXIN ) 500 MG tablet, Take 1 tablet (500 mg total) by mouth every 6 (six) hours as needed for muscle spasms., Disp: 40 tablet, Rfl: 0   metoprolol  succinate (TOPROL -XL) 100 MG 24 hr tablet, TAKE 1 TABLET BY MOUTH EVERY DAY EVERY EVENING WITH OR IMMEDIATELY FOLLOWING A MEAL, Disp: 90 tablet, Rfl: 1   montelukast  (SINGULAIR ) 10 MG tablet, TAKE 1 TABLET BY MOUTH EVERYDAY AT BEDTIME, Disp: 90 tablet, Rfl: 2   MOUNJARO  2.5 MG/0.5ML Pen, Inject 2.5 mg into the skin once a week., Disp: , Rfl:    Octreotide  Acetate (SANDOSTATIN  IJ), Inject 1 Dose as directed every 30 (thirty) days., Disp: , Rfl:    ondansetron  (ZOFRAN -ODT) 4 MG disintegrating tablet, TAKE 1 TABLET BY  MOUTH EVERY 8 HOURS AS NEEDED *INS MAX 18 TABS PER 21 DAYS*, Disp: 18 tablet, Rfl: 3   pantoprazole  (PROTONIX ) 40 MG tablet, TAKE 1 TABLET BY MOUTH TWICE A DAY, Disp: 180 tablet, Rfl: 0   rosuvastatin  (CRESTOR ) 10 MG tablet, TAKE 1 TABLET BY MOUTH EVERY DAY, Disp: 90 tablet, Rfl: 0   scopolamine  (TRANSDERM-SCOP) 1 MG/3DAYS, Place 1 patch (1.5 mg total) onto the skin every 3 (three) days., Disp: 10 patch, Rfl: 12   valsartan  (DIOVAN ) 320 MG tablet, TAKE 1 TABLET BY MOUTH EVERY DAY, Disp: 90 tablet, Rfl: 0   venlafaxine  XR (EFFEXOR -XR) 150 MG 24 hr capsule, TAKE 1 CAPSULE BY MOUTH EVERY DAY IN THE MORNING, Disp: 90 capsule, Rfl: 1   Objective:     There were no vitals filed for this visit.    There is no height  or weight on file to calculate BMI.    Physical Exam:    ***   Electronically signed by:  Marshall Skeeter D[facility] 7:38 AM 06/03/23

## 2023-06-04 ENCOUNTER — Ambulatory Visit (INDEPENDENT_AMBULATORY_CARE_PROVIDER_SITE_OTHER): Admitting: Sports Medicine

## 2023-06-04 VITALS — BP 132/80 | HR 74 | Ht 59.0 in | Wt 171.0 lb

## 2023-06-04 DIAGNOSIS — M65311 Trigger thumb, right thumb: Secondary | ICD-10-CM | POA: Diagnosis not present

## 2023-06-04 DIAGNOSIS — M65312 Trigger thumb, left thumb: Secondary | ICD-10-CM

## 2023-06-05 ENCOUNTER — Encounter: Payer: Self-pay | Admitting: Hematology & Oncology

## 2023-06-05 ENCOUNTER — Inpatient Hospital Stay

## 2023-06-05 ENCOUNTER — Other Ambulatory Visit: Payer: Self-pay | Admitting: Physician Assistant

## 2023-06-05 ENCOUNTER — Inpatient Hospital Stay (HOSPITAL_BASED_OUTPATIENT_CLINIC_OR_DEPARTMENT_OTHER): Admitting: Hematology & Oncology

## 2023-06-05 VITALS — BP 133/67 | HR 75 | Temp 99.0°F | Resp 20 | Ht 59.0 in | Wt 172.0 lb

## 2023-06-05 DIAGNOSIS — C7B8 Other secondary neuroendocrine tumors: Secondary | ICD-10-CM

## 2023-06-05 DIAGNOSIS — C7A00 Malignant carcinoid tumor of unspecified site: Secondary | ICD-10-CM | POA: Diagnosis not present

## 2023-06-05 DIAGNOSIS — C7B02 Secondary carcinoid tumors of liver: Secondary | ICD-10-CM | POA: Diagnosis not present

## 2023-06-05 LAB — CMP (CANCER CENTER ONLY)
ALT: 11 U/L (ref 0–44)
AST: 13 U/L — ABNORMAL LOW (ref 15–41)
Albumin: 4.2 g/dL (ref 3.5–5.0)
Alkaline Phosphatase: 78 U/L (ref 38–126)
Anion gap: 7 (ref 5–15)
BUN: 17 mg/dL (ref 8–23)
CO2: 28 mmol/L (ref 22–32)
Calcium: 9.6 mg/dL (ref 8.9–10.3)
Chloride: 106 mmol/L (ref 98–111)
Creatinine: 0.68 mg/dL (ref 0.44–1.00)
GFR, Estimated: >60 mL/min (ref >60)
Glucose, Bld: 99 mg/dL (ref 70–99)
Potassium: 4.6 mmol/L (ref 3.5–5.1)
Sodium: 141 mmol/L (ref 135–145)
Total Bilirubin: 0.3 mg/dL (ref 0.0–1.2)
Total Protein: 7.1 g/dL (ref 6.5–8.1)

## 2023-06-05 LAB — CBC WITH DIFFERENTIAL (CANCER CENTER ONLY)
Abs Immature Granulocytes: 0.01 10*3/uL (ref 0.00–0.07)
Basophils Absolute: 0.1 10*3/uL (ref 0.0–0.1)
Basophils Relative: 1 %
Eosinophils Absolute: 0.1 10*3/uL (ref 0.0–0.5)
Eosinophils Relative: 2 %
HCT: 35.8 % — ABNORMAL LOW (ref 36.0–46.0)
Hemoglobin: 11.8 g/dL — ABNORMAL LOW (ref 12.0–15.0)
Immature Granulocytes: 0 %
Lymphocytes Relative: 17 %
Lymphs Abs: 1.1 10*3/uL (ref 0.7–4.0)
MCH: 30.6 pg (ref 26.0–34.0)
MCHC: 33 g/dL (ref 30.0–36.0)
MCV: 92.7 fL (ref 80.0–100.0)
Monocytes Absolute: 0.6 10*3/uL (ref 0.1–1.0)
Monocytes Relative: 9 %
Neutro Abs: 4.7 10*3/uL (ref 1.7–7.7)
Neutrophils Relative %: 71 %
Platelet Count: 196 10*3/uL (ref 150–400)
RBC: 3.86 MIL/uL — ABNORMAL LOW (ref 3.87–5.11)
RDW: 14.2 % (ref 11.5–15.5)
WBC Count: 6.7 10*3/uL (ref 4.0–10.5)
nRBC: 0 % (ref 0.0–0.2)

## 2023-06-05 MED ORDER — LANREOTIDE ACETATE 120 MG/0.5ML ~~LOC~~ SOLN
120.0000 mg | Freq: Once | SUBCUTANEOUS | Status: AC
  Administered 2023-06-05: 120 mg via SUBCUTANEOUS
  Filled 2023-06-05: qty 120

## 2023-06-05 NOTE — Patient Instructions (Signed)
 Lanreotide Injection What is this medication? LANREOTIDE (lan REE oh tide) treats high levels of growth hormone (acromegaly). It is used when other therapies have not worked well enough or cannot be tolerated. It works by reducing the amount of growth hormone your body makes. This reduces symptoms and the risk of health problems caused by too much growth hormone, such as diabetes and heart disease. It may also be used to treat neuroendocrine tumors, a cancer of the cells that release hormones and other substances in your body. It works by slowing down the release of these substances from the cells. This slows tumor growth. It also decreases the symptoms of carcinoid syndrome, such as flushing or diarrhea. This medicine may be used for other purposes; ask your health care provider or pharmacist if you have questions. COMMON BRAND NAME(S): Somatuline [place] What should I tell my care team before I take this medication? They need to know if you have any of these conditions: Diabetes Gallbladder disease Heart disease Kidney disease Liver disease Pancreatic disease Thyroid disease An unusual or allergic reaction to lanreotide, other medications, foods, dyes, or preservatives Pregnant or trying to get pregnant Breastfeeding How should I use this medication? This medication is injected under the skin. It is given by your care team in a hospital or clinic setting. Talk to your care team about the use of this medication in children. Special care may be needed. Overdosage: If you think you have taken too much of this medicine contact a poison control center or emergency room at once. NOTE: This medicine is only for you. Do not share this medicine with others. What if I miss a dose? Keep appointments for follow-up doses. It is important not to miss your dose. Call your care team if you are unable to keep an appointment. What may interact with this medication? Bromocriptine Cyclosporine Certain  medications for blood pressure, heart disease, irregular heartbeat Certain medications for diabetes Quinidine Terfenadine This list may not describe all possible interactions. Give your health care provider a list of all the medicines, herbs, non-prescription drugs, or dietary supplements you use. Also tell them if you smoke, drink alcohol, or use illegal drugs. Some items may interact with your medicine. What should I watch for while using this medication? Visit your care team for regular checks on your progress. Tell your care team if your symptoms do not start to get better or if they get worse. Your condition will be monitored carefully while you are receiving this medication. You may need blood work while you are taking this medication. This medication may increase blood sugar. The risk may be higher in patients who already have diabetes. Ask your care team what you can do to lower your risk of diabetes while taking this medication. Talk to your care team if you wish to become pregnant or think you may be pregnant. This medication can cause serious birth defects. Do not breast-feed while taking this medication and for 6 months after stopping therapy. This medication may cause infertility. Talk to your care team if you are concerned about your fertility. What side effects may I notice from receiving this medication? Side effects that you should report to your care team as soon as possible: Allergic reactions--skin rash, itching, hives, swelling of the face, lips, tongue, or throat Gallbladder problems--severe stomach pain, nausea, vomiting, fever High blood sugar (hyperglycemia)--increased thirst or amount of urine, unusual weakness or fatigue, blurry vision Increase in blood pressure Low blood sugar (hypoglycemia)--pale, blue or purple  skin or lips, sweating, fussiness, rapid heartbeat, poor feeding, low body temperature Low thyroid levels (hypothyroidism)--unusual weakness or fatigue,  increased sensitivity to cold, constipation, hair loss, dry skin, weight gain, feelings of depression Oily or light-colored stools, diarrhea, bloating, weight loss Slow heartbeat--dizziness, feeling faint or lightheaded, confusion, trouble breathing, unusual weakness or fatigue Side effects that usually do not require medical attention (report these to your care team if they continue or are bothersome): Diarrhea Dizziness Headache Muscle spasms Nausea Pain, redness, or irritation at injection site Stomach pain This list may not describe all possible side effects. Call your doctor for medical advice about side effects. You may report side effects to FDA at 1-800-FDA-1088. Where should I keep my medication? This medication is given in a hospital or clinic. It will not be stored at home. NOTE: This sheet is a summary. It may not cover all possible information. If you have questions about this medicine, talk to your doctor, pharmacist, or health care provider.  2024 Elsevier/Gold Standard (2023-01-09 00:00:00)

## 2023-06-05 NOTE — Progress Notes (Signed)
 Hematology and Oncology Follow Up Visit  AYAKO TAPANES [id number] 05-15-1960 63 y.o. 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 Kizer is here today for follow-up and Somatuline injection.  She is doing quite well.  She really has done nicely with the Lutathera  injections.  She has had 2 of them.  The last 1 was done about 3 weeks ago.  She does not have any problems with diarrhea.  Her last Chromogranin A level was 107.  She has had no issues with fever.  She has had no cough or shortness of breath.  She has had no rashes.  There has been no leg swelling.  Unfortunately, a cousin passed away recently.  We actually took care of him.  He had pancreatic cancer.  She has had no headache.  Overall, I will have to say that her performance status is probably ECOG 1.  Medications:  Allergies as of 06/05/2023   No Known Allergies      Medication List        Accurate as of June 05, 2023 10:24 AM. If you have any questions, ask your nurse or doctor.          albuterol  108 (90 Base) MCG/ACT inhaler Commonly known as: VENTOLIN  HFA Inhale 2 puffs into the lungs every 6 (six) hours as needed for wheezing or shortness of breath.   amLODipine  10 MG tablet Commonly known as: NORVASC  TAKE 1 TABLET BY MOUTH EVERY DAY   clonazePAM  0.5 MG tablet Commonly known as: KLONOPIN  TAKE 1 TABLET BY MOUTH TWICE A DAY   docusate sodium  100 MG capsule Commonly known as: COLACE TAKE 1 CAPSULE BY MOUTH TWICE A DAY   fexofenadine  180 MG tablet Commonly known as: ALLEGRA  TAKE 1 TABLET BY MOUTH EVERY DAY   gabapentin  100 MG capsule Commonly known as: NEURONTIN  Take 1 capsule (100 mg total) by mouth at bedtime.   ibuprofen  800 MG tablet Commonly known  as: ADVIL  Take 1 tablet (800 mg total) by mouth every 8 (eight) hours as needed for moderate pain (pain score 4-6) or cramping. Take with food.   Lutathera  370 MBQ/ML Soln Generic drug: lutetium Lu 177 dotatate  Inject 200 millicuries into the vein. Every 2 months.   metFORMIN  500 MG tablet Commonly known as: GLUCOPHAGE  Take 1 tablet (500 mg total) by mouth 2 (two) times daily with a meal.   methocarbamol  500 MG tablet Commonly known as: ROBAXIN  Take 1 tablet (500 mg total) by mouth every 6 (six) hours as needed for muscle spasms.   metoprolol  succinate 100 MG 24 hr tablet Commonly known as: TOPROL -XL TAKE 1 TABLET BY MOUTH EVERY DAY EVERY EVENING WITH OR IMMEDIATELY FOLLOWING A MEAL   montelukast  10 MG tablet Commonly known as: SINGULAIR  TAKE 1 TABLET BY MOUTH EVERYDAY AT BEDTIME   Mounjaro  2.5 MG/0.5ML Pen Generic drug: tirzepatide  Inject 2.5 mg into the skin once a week.   ondansetron  4 MG disintegrating tablet Commonly known as: ZOFRAN -ODT TAKE 1 TABLET BY MOUTH EVERY 8 HOURS AS NEEDED *INS MAX 18 TABS PER 21 DAYS*   pantoprazole  40 MG tablet Commonly known as: PROTONIX  TAKE 1 TABLET BY MOUTH TWICE A DAY   rosuvastatin  10 MG tablet Commonly known as: CRESTOR  TAKE 1 TABLET BY MOUTH  EVERY DAY   SANDOSTATIN  IJ Inject 1 Dose as directed every 30 (thirty) days.   scopolamine  1 MG/3DAYS Commonly known as: TRANSDERM-SCOP Place 1 patch (1.5 mg total) onto the skin every 3 (three) days.   valACYclovir  1000 MG tablet Commonly known as: VALTREX  Take 1,000 mg by mouth daily.   valsartan  320 MG tablet Commonly known as: DIOVAN  TAKE 1 TABLET BY MOUTH EVERY DAY   venlafaxine  XR 150 MG 24 hr capsule Commonly known as: EFFEXOR -XR TAKE 1 CAPSULE BY MOUTH EVERY DAY IN THE MORNING        Allergies: No Known Allergies  Past Medical History, Surgical history, Social history, and Family History were reviewed and updated.  Review of Systems: Review of Systems   Constitutional:  Positive for malaise/fatigue.  HENT: Negative.    Eyes: Negative.   Respiratory: Negative.    Cardiovascular: Negative.   Gastrointestinal: Negative.   Genitourinary: Negative.   Musculoskeletal: Negative.   Skin: Negative.   Neurological: Negative.   Endo/Heme/Allergies: Negative.   Psychiatric/Behavioral: Negative.       Physical Exam:  height is 4\' 11"  (1.499 m) and weight is 172 lb 0.6 oz (78 kg). Her oral temperature is 99 F (37.2 C). Her blood pressure is 133/67 and her pulse is 75. Her respiration is 20 and oxygen  saturation is 100%.   Wt Readings from Last 3 Encounters:  06/05/23 172 lb 0.6 oz (78 kg)  06/04/23 171 lb (77.6 kg)  04/22/23 171 lb 12.8 oz (77.9 kg)    Physical Exam Vitals reviewed.  HENT:     Head: Normocephalic and atraumatic.  Eyes:     Pupils: Pupils are equal, round, and reactive to light.  Cardiovascular:     Rate and Rhythm: Normal rate and regular rhythm.     Heart sounds: Normal heart sounds.  Pulmonary:     Effort: Pulmonary effort is normal.     Breath sounds: Normal breath sounds.  Abdominal:     General: Bowel sounds are normal.     Palpations: Abdomen is soft.  Musculoskeletal:        General: No tenderness or deformity. Normal range of motion.     Cervical back: Normal range of motion.  Lymphadenopathy:     Cervical: No cervical adenopathy.  Skin:    General: Skin is warm and dry.     Findings: No erythema or rash.  Neurological:     Mental Status: She is alert and oriented to person, place, and time.  Psychiatric:        Behavior: Behavior normal.        Thought Content: Thought content normal.        Judgment: Judgment normal.       Lab Results  Component Value Date   WBC 6.7 06/05/2023   HGB 11.8 (L) 06/05/2023   HCT 35.8 (L) 06/05/2023   MCV 92.7 06/05/2023   PLT 196 06/05/2023   Lab Results  Component Value Date   FERRITIN 382 (H) 10/18/2008   IRON <10 (L) 10/18/2008   TIBC NOT CALC Not  calculated due to Iron <10. 10/18/2008   UIBC 156 10/18/2008   IRONPCTSAT NOT CALC Not calculated due to Iron <10. 10/18/2008   Lab Results  Component Value Date   RETICCTPCT 0.9 10/18/2008   RBC 3.86 (L) 06/05/2023   No results found for: "KPAFRELGTCHN", "LAMBDASER", "KAPLAMBRATIO" No results found for: "IGGSERUM", "IGA", "IGMSERUM" No results found for: "TOTALPROTELP", "ALBUMINELP", "A1GS", "A2GS", "BETS", "BETA2SER", "GAMS", "MSPIKE", "SPEI"  Chemistry      Component Value Date/Time   NA 141 06/05/2023 0942   NA 140 01/20/2017 1416   NA 141 01/17/2016 1047   K 4.6 06/05/2023 0942   K 3.9 01/20/2017 1416   K 3.2 (L) 01/17/2016 1047   CL 106 06/05/2023 0942   CL 102 01/20/2017 1416   CO2 28 06/05/2023 0942   CO2 26 01/20/2017 1416   CO2 27 01/17/2016 1047   BUN 17 06/05/2023 0942   BUN 15 01/20/2017 1416   BUN 6.6 (L) 01/17/2016 1047   CREATININE 0.68 06/05/2023 0942   CREATININE 1.1 01/20/2017 1416   CREATININE 0.7 01/17/2016 1047      Component Value Date/Time   CALCIUM  9.6 06/05/2023 0942   CALCIUM  9.1 01/20/2017 1416   CALCIUM  8.7 01/17/2016 1047   ALKPHOS 78 06/05/2023 0942   ALKPHOS 84 01/20/2017 1416   ALKPHOS 90 01/17/2016 1047   AST 13 (L) 06/05/2023 0942   AST 34 01/17/2016 1047   ALT 11 06/05/2023 0942   ALT 35 01/20/2017 1416   ALT 38 01/17/2016 1047   BILITOT 0.3 06/05/2023 0942   BILITOT 0.42 01/17/2016 1047       Impression and Plan: Ms. Fahs is a very pleasant 63 yo caucasian female with long standing history of metastatic carcinoid with liver metastasis.   She had progressive disease and is now on Lutathera .  She has 2 cycles to date.  We will see what her Chromogranin A level is.  She does well with the Somatuline..  Will have her come back for her next Somatuline in a month.    Ivor Mars, MD 4/25/202510:24 AM

## 2023-06-06 ENCOUNTER — Other Ambulatory Visit: Payer: Self-pay | Admitting: Physician Assistant

## 2023-06-06 DIAGNOSIS — F419 Anxiety disorder, unspecified: Secondary | ICD-10-CM

## 2023-06-09 LAB — CHROMOGRANIN A: Chromogranin A (ng/mL): 86.8 ng/mL (ref 0.0–101.8)

## 2023-06-16 ENCOUNTER — Other Ambulatory Visit (HOSPITAL_COMMUNITY): Payer: BC Managed Care – PPO

## 2023-06-18 DIAGNOSIS — E66811 Obesity, class 1: Secondary | ICD-10-CM | POA: Diagnosis not present

## 2023-06-18 DIAGNOSIS — E1169 Type 2 diabetes mellitus with other specified complication: Secondary | ICD-10-CM | POA: Diagnosis not present

## 2023-06-18 DIAGNOSIS — F439 Reaction to severe stress, unspecified: Secondary | ICD-10-CM | POA: Diagnosis not present

## 2023-06-18 DIAGNOSIS — E1159 Type 2 diabetes mellitus with other circulatory complications: Secondary | ICD-10-CM | POA: Diagnosis not present

## 2023-06-25 ENCOUNTER — Encounter: Payer: Self-pay | Admitting: Family

## 2023-06-30 ENCOUNTER — Encounter: Payer: Self-pay | Admitting: Internal Medicine

## 2023-06-30 ENCOUNTER — Encounter: Payer: Self-pay | Admitting: Family

## 2023-06-30 ENCOUNTER — Ambulatory Visit (AMBULATORY_SURGERY_CENTER)

## 2023-06-30 VITALS — Ht 59.5 in | Wt 168.0 lb

## 2023-06-30 DIAGNOSIS — Z1211 Encounter for screening for malignant neoplasm of colon: Secondary | ICD-10-CM

## 2023-06-30 MED ORDER — NA SULFATE-K SULFATE-MG SULF 17.5-3.13-1.6 GM/177ML PO SOLN: 1.0000 | Freq: Once | ORAL | 0 refills | Status: AC

## 2023-06-30 NOTE — Progress Notes (Signed)
 Pre visit completed via phone call; Patient verified name, DOB, and address; No egg or soy allergy known to patient;  No issues known to pt with past sedation with any surgeries or procedures; Patient denies ever being told they had issues or difficulty with intubation;  No FH of Malignant Hyperthermia; Pt is not on diet pills; Pt is not on home 02;  Pt is not on blood thinners; Pt denies issues with constipation; patient reports she takes Colace twice daily due to medication changes and to also keep her "more regular"; No A fib or A flutter; Have any cardiac testing pending--NO Insurance verified during PV appt--- BCBS Pt can ambulate without assistance;  Pt denies use of chewing tobacco; Discussed diabetic/weight loss medication holds; Discussed NSAID holds; Checked BMI to be less than 50; Pt instructed to use [employer] or GoodRx for a price reduction on prep;  Patient's chart reviewed by Rogena Class CNRA prior to previsit and patient appropriate for the [facility];  Pre visit completed and red dot placed by patient's name on their procedure day (on provider's schedule);  Instructions sent to MyChart per patient request;

## 2023-07-02 ENCOUNTER — Other Ambulatory Visit: Payer: Self-pay | Admitting: Physician Assistant

## 2023-07-05 ENCOUNTER — Other Ambulatory Visit: Payer: Self-pay | Admitting: Hematology & Oncology

## 2023-07-06 ENCOUNTER — Encounter: Payer: Self-pay | Admitting: Family

## 2023-07-07 ENCOUNTER — Ambulatory Visit (HOSPITAL_COMMUNITY)
Admission: RE | Admit: 2023-07-07 | Discharge: 2023-07-07 | Disposition: A | Discharge status: Home / Self Care | Payer: BC Managed Care – PPO | Source: Ambulatory Visit | Attending: Hematology & Oncology | Admitting: Hematology & Oncology

## 2023-07-07 VITALS — BP 160/88 | HR 74 | Resp 14

## 2023-07-07 DIAGNOSIS — C7A8 Other malignant neuroendocrine tumors: Secondary | ICD-10-CM | POA: Diagnosis not present

## 2023-07-07 DIAGNOSIS — D3A8 Other benign neuroendocrine tumors: Secondary | ICD-10-CM | POA: Diagnosis not present

## 2023-07-07 DIAGNOSIS — C7B8 Other secondary neuroendocrine tumors: Secondary | ICD-10-CM | POA: Diagnosis not present

## 2023-07-07 DIAGNOSIS — C220 Liver cell carcinoma: Secondary | ICD-10-CM | POA: Diagnosis not present

## 2023-07-07 LAB — COMPREHENSIVE METABOLIC PANEL WITH GFR
ALT: 11 U/L (ref 0–44)
AST: 16 U/L (ref 15–41)
Albumin: 3.7 g/dL (ref 3.5–5.0)
Alkaline Phosphatase: 72 U/L (ref 38–126)
Anion gap: 8 (ref 5–15)
BUN: 19 mg/dL (ref 8–23)
CO2: 24 mmol/L (ref 22–32)
Calcium: 8.9 mg/dL (ref 8.9–10.3)
Chloride: 104 mmol/L (ref 98–111)
Creatinine, Ser: 0.79 mg/dL (ref 0.44–1.00)
GFR, Estimated: >60 mL/min (ref >60)
Glucose, Bld: 83 mg/dL (ref 70–99)
Potassium: 4.3 mmol/L (ref 3.5–5.1)
Sodium: 136 mmol/L (ref 135–145)
Total Bilirubin: 0.9 mg/dL (ref 0.0–1.2)
Total Protein: 7.4 g/dL (ref 6.5–8.1)

## 2023-07-07 LAB — CBC WITH DIFFERENTIAL/PLATELET
Abs Immature Granulocytes: 0.02 10*3/uL (ref 0.00–0.07)
Basophils Absolute: 0 10*3/uL (ref 0.0–0.1)
Basophils Relative: 1 %
Eosinophils Absolute: 0.2 10*3/uL (ref 0.0–0.5)
Eosinophils Relative: 3 %
HCT: 35.5 % — ABNORMAL LOW (ref 36.0–46.0)
Hemoglobin: 11.4 g/dL — ABNORMAL LOW (ref 12.0–15.0)
Immature Granulocytes: 0 %
Lymphocytes Relative: 18 %
Lymphs Abs: 1.2 10*3/uL (ref 0.7–4.0)
MCH: 30.4 pg (ref 26.0–34.0)
MCHC: 32.1 g/dL (ref 30.0–36.0)
MCV: 94.7 fL (ref 80.0–100.0)
Monocytes Absolute: 0.5 10*3/uL (ref 0.1–1.0)
Monocytes Relative: 7 %
Neutro Abs: 4.5 10*3/uL (ref 1.7–7.7)
Neutrophils Relative %: 71 %
Platelets: 206 10*3/uL (ref 150–400)
RBC: 3.75 MIL/uL — ABNORMAL LOW (ref 3.87–5.11)
RDW: 13.6 % (ref 11.5–15.5)
WBC: 6.3 10*3/uL (ref 4.0–10.5)
nRBC: 0 % (ref 0.0–0.2)

## 2023-07-07 MED ORDER — SODIUM CHLORIDE 0.9 % IV SOLN
8.0000 mg | Freq: Once | INTRAVENOUS | Status: AC
  Administered 2023-07-07: 8 mg via INTRAVENOUS
  Filled 2023-07-07: qty 4

## 2023-07-07 MED ORDER — PROCHLORPERAZINE EDISYLATE 10 MG/2ML IJ SOLN
INTRAMUSCULAR | Status: AC
Start: 2023-07-07 — End: ?
  Filled 2023-07-07: qty 2

## 2023-07-07 MED ORDER — SODIUM CHLORIDE 0.9 % IV SOLN: 500.0000 mL | Freq: Once | INTRAVENOUS | Status: DC

## 2023-07-07 MED ORDER — ONDANSETRON HCL 8 MG PO TABS: 8.0000 mg | ORAL_TABLET | Freq: Two times a day (BID) | ORAL | 0 refills | Status: DC | PRN

## 2023-07-07 MED ORDER — PROCHLORPERAZINE EDISYLATE 10 MG/2ML IJ SOLN
10.0000 mg | Freq: Four times a day (QID) | INTRAMUSCULAR | Status: DC | PRN
Start: 2023-07-07 — End: 2023-07-13

## 2023-07-07 MED ORDER — OCTREOTIDE ACETATE 30 MG IM KIT
PACK | INTRAMUSCULAR | Status: AC
  Filled 2023-07-07: qty 1

## 2023-07-07 MED ORDER — OCTREOTIDE ACETATE 500 MCG/ML IJ SOLN: 500.0000 ug | Freq: Once | INTRAMUSCULAR | Status: DC | PRN

## 2023-07-07 MED ORDER — OCTREOTIDE ACETATE 30 MG IM KIT
30.0000 mg | PACK | Freq: Once | INTRAMUSCULAR | Status: AC
  Administered 2023-07-07: 30 mg via INTRAMUSCULAR

## 2023-07-07 MED ORDER — LUTETIUM LU 177 DOTATATE 370 MBQ/ML IV SOLN
200.0000 | Freq: Once | INTRAVENOUS | Status: AC
  Administered 2023-07-07: 203.063 via INTRAVENOUS

## 2023-07-07 MED ORDER — AMINO ACID RADIOPROTECTANT - L-LYSINE 2.5%/L-ARGININE 2.5% IN NS
250.0000 mL/h | INTRAVENOUS | Status: AC
  Administered 2023-07-07: 250 mL/h via INTRAVENOUS
  Filled 2023-07-07: qty 1000

## 2023-07-07 NOTE — Progress Notes (Signed)
 CLINICAL DATA: [age] year-old [female] with metastatic neuroendocrine tumor. Well differentiated tumor with somatostatin receptor is identified within the [liver] by DOTATATE PET CT scan.  EXAM: NUCLEAR MEDICINE LUTATHERA  ADMINISTRATION  TECHNIQUE: Infusion: The nuclear medicine technologist and I personally verified the dose activity ([204] mCi) to be delivered as specified in the written directive (200 mCi), and verified the patient identification via 2 separate methods.  20 gauge IV were started in the antecubital veins. Anti-emetics were administered by nursing staff. Amino acid renal protection was initiated 30 minutes prior to Lu 177 DOTATATE (Lutathera ) infusion and continued continuously for 4 hours. Lutathera  infusion was administered over 30 minutes.      The total administered dose was [203] mCi Lu 177 DOTATATE.    The entire IV tubing, venocatheter, stopcock and syringes was removed in total, placed in a disposal bag and sent for assay of the residual activity, which will be reported at a later time in our EMR by the physics staff. Pressure was applied to the venipuncture sites, and a compression bandage placed. Radiation Safety personnel were present to perform the discharge survey, as detailed on their documentation.    Patient received 30 mg IM long-acting Sandostatin  injection 4 hours after Lutathera  effusion in the nuclear medicine department.   RADIOPHARMACEUTICALS:   [203] mCi Lu 177 DOTATATE   FINDINGS: Diagnosis: [Metastatic neuroendocrine tumor.]    Current Infusion: [3]    Planned Infusions: [4]    Patient reports [minimal] interval symptoms following therapy. The patient's most recent blood counts were reviewed and remains a good candidate to proceed with Lutathera .     Chromogranin A  mildly improved 86.8 from 107.3     The patient was situated in an infusion suite and administered Lutathera  as above. Patient will follow-up with referring  oncologist for interval serum laboratories (CBC and CMP) in approximately 4 weeks.       Patient received 30 mg IM long-acting Sandostatin  injection 4 hours after Lutathera  effusion in the nuclear medicine department.     IMPRESSION: [Third] Lu 177 DOTATATE treatment for metastatic neuroendocrine tumor. The patient tolerated the infusion well. The patient will return in 8 weeks for ongoing care.

## 2023-07-07 NOTE — Written Directive (Addendum)
 LUTATHERA  THERAPY   RADIOPHARMACEUTICAL:  Lutetium 177 Dotatate (Lutathera )     PRESCRIBED DOSE FOR ADMINISTRATION:  200 mCi   ROUTE OFADMINISTRATION:  IV   DIAGNOSIS:  Progressive neuroendocrine carcinoma. 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 Maria Shiner   TREATMENT #:3   DATE OF LAST LONG LIVED SOMATOSTATIN INJECTION: 06/05/2023   ADDITIONAL PHYSICIAN COMMENTS/NOTES:   AUTHORIZED USER SIGNATURE & TIME STAMP: Reino Carbo, MD   07/07/23    8:11 AM

## 2023-07-08 ENCOUNTER — Inpatient Hospital Stay

## 2023-07-08 ENCOUNTER — Ambulatory Visit

## 2023-07-08 ENCOUNTER — Ambulatory Visit: Admitting: Hematology & Oncology

## 2023-07-15 ENCOUNTER — Other Ambulatory Visit: Payer: Self-pay | Admitting: Physician Assistant

## 2023-07-23 ENCOUNTER — Other Ambulatory Visit: Payer: Self-pay | Admitting: Physician Assistant

## 2023-07-23 DIAGNOSIS — R110 Nausea: Secondary | ICD-10-CM

## 2023-07-28 ENCOUNTER — Ambulatory Visit (AMBULATORY_SURGERY_CENTER): Admitting: Internal Medicine

## 2023-07-28 ENCOUNTER — Encounter: Payer: Self-pay | Admitting: Internal Medicine

## 2023-07-28 VITALS — BP 120/60 | HR 62 | Temp 98.1°F | Resp 12 | Ht 59.5 in | Wt 168.0 lb

## 2023-07-28 DIAGNOSIS — Z1211 Encounter for screening for malignant neoplasm of colon: Secondary | ICD-10-CM | POA: Diagnosis not present

## 2023-07-28 DIAGNOSIS — K5730 Diverticulosis of large intestine without perforation or abscess without bleeding: Secondary | ICD-10-CM | POA: Diagnosis not present

## 2023-07-28 DIAGNOSIS — K648 Other hemorrhoids: Secondary | ICD-10-CM | POA: Diagnosis not present

## 2023-07-28 MED ORDER — SODIUM CHLORIDE 0.9 % IV SOLN: 500.0000 mL | Freq: Once | INTRAVENOUS | Status: DC

## 2023-07-28 NOTE — Patient Instructions (Signed)
Handouts Provided:  Diverticulosis  YOU HAD AN ENDOSCOPIC PROCEDURE TODAY AT THE [facility]:   Refer to the procedure report that was given to you for any specific questions about what was found during the examination.  If the procedure report does not answer your questions, please call your gastroenterologist to clarify.  If you requested that your care partner not be given the details of your procedure findings, then the procedure report has been included in a sealed envelope for you to review at your convenience later.  YOU SHOULD EXPECT: Some feelings of bloating in the abdomen. Passage of more gas than usual.  Walking can help get rid of the air that was put into your GI tract during the procedure and reduce the bloating. If you had a lower endoscopy (such as a colonoscopy or flexible sigmoidoscopy) you may notice spotting of blood in your stool or on the toilet paper. If you underwent a bowel prep for your procedure, you may not have a normal bowel movement for a few days.  Please Note:  You might notice some irritation and congestion in your nose or some drainage.  This is from the oxygen used during your procedure.  There is no need for concern and it should clear up in a day or so.  SYMPTOMS TO REPORT IMMEDIATELY:  Following lower endoscopy (colonoscopy or flexible sigmoidoscopy):  Excessive amounts of blood in the stool  Significant tenderness or worsening of abdominal pains  Swelling of the abdomen that is new, acute  Fever of 100F or higher  For urgent or emergent issues, a gastroenterologist can be reached at any hour by calling [phone]. Do not use MyChart messaging for urgent concerns.    DIET:  We do recommend a small meal at first, but then you may proceed to your regular diet.  Drink plenty of fluids but you should avoid alcoholic beverages for 24 hours.  ACTIVITY:  You should plan to take it easy for the rest of today and you should NOT DRIVE or use heavy  machinery until tomorrow (because of the sedation medicines used during the test).    FOLLOW UP: Our staff will call the number listed on your records the next business day following your procedure.  We will call around 7:15- 8:00 am to check on you and address any questions or concerns that you may have regarding the information given to you following your procedure. If we do not reach you, we will leave a message.     If any biopsies were taken you will be contacted by phone or by letter within the next 1-3 weeks.  Please call us at [phone] if you have not heard about the biopsies in 3 weeks.    SIGNATURES/CONFIDENTIALITY: You and/or your care partner have signed paperwork which will be entered into your electronic medical record.  These signatures attest to the fact that that the information above on your After Visit Summary has been reviewed and is understood.  Full responsibility of the confidentiality of this discharge information lies with you and/or your care-partner.  

## 2023-07-28 NOTE — Progress Notes (Signed)
 A/O x 3, gd SR's, VSS, report to RN

## 2023-07-28 NOTE — Progress Notes (Signed)
 Pt's states no medical or surgical changes since previsit or office visit.

## 2023-07-28 NOTE — Progress Notes (Signed)
 GASTROENTEROLOGY PROCEDURE H&P NOTE   Primary Care Physician: Allwardt, Deleta Felix, PA-C    Reason for Procedure:   Colon cancer screening  Plan:    Colonoscopy   Patient is appropriate for endoscopic procedure(s) in the ambulatory ([facility]) setting.  The nature of the procedure, as well as the risks, benefits, and alternatives were carefully and thoroughly reviewed with the patient. Ample time for discussion and questions allowed. The patient understood, was satisfied, and agreed to proceed.     HPI: Leah [name] is a 63 y.o. female who presents for colonoscopy for colon cancer screening. Denies blood in stools or unintentional weight loss. She has diarrhea after she gets octreotide . Denies family history of colon cancer. Last colonoscopy was in 2010 that was normal.  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   Constipation    Diabetes (HCC)    Dyspnea    with exertion   Fatty liver    Gallbladder problem    GE reflux    Hepatic encephalopathy (HCC) 2011   History of PCR DNA positive for HSV1    History of retinal vein occlusion, right eye    HTN (hypertension) 2011   Joint pain    Liver tumor    Neuroendocrine tumor 2011   Orthostatic hypotension    Osteoarthritis    Osteopenia    Palpitations    Sarcoidosis 2008   Sleep apnea    uses cpap   Surgical menopause     Past Surgical History:  Procedure Laterality Date   ABDOMINAL HYSTERECTOMY  2004   AUGMENTATION MAMMAPLASTY     BREAST BIOPSY Left    BREAST RECONSTRUCTION Bilateral 05/07/2020   Procedure: Revision of bilateral breast reconstruction;  Surgeon: Barb Bonito, MD;  Location: [facility];  Service: Plastics;  Laterality: Bilateral;  2 hours   CESAREAN SECTION  1610,9604   CHOLECYSTECTOMY  2006   HERNIA REPAIR     KNEE  ARTHROSCOPY Left 09/2010   LAPAROSCOPIC HEPATECTOMY     LIPOSUCTION Bilateral 05/07/2020   Procedure: LIPOSUCTION with excision of lateral breast and axillary tissue;  Surgeon: Barb Bonito, MD;  Location: [facility];  Service: Plastics;  Laterality: Bilateral;   LIVER SURGERY     MASTECTOMY Bilateral 2008   NASAL SINUS SURGERY     OOPHORECTOMY  2004   RECONSTRUCTION BREAST W/ LATISSIMUS DORSI FLAP Bilateral    TONSILLECTOMY  1976   TOTAL KNEE ARTHROPLASTY Left 10/29/2020   Procedure: TOTAL KNEE ARTHROPLASTY;  Surgeon: Liliane Rei, MD;  Location: [facility];  Service: Orthopedics;  Laterality: Left;   TOTAL KNEE ARTHROPLASTY Right 04/14/2022   Procedure: TOTAL KNEE ARTHROPLASTY;  Surgeon: Liliane Rei, MD;  Location: [facility];  Service: Orthopedics;  Laterality: Right;   TUBAL LIGATION Bilateral 2001    Prior to Admission medications   Medication Sig Start Date End Date Taking? Authorizing Provider  amLODipine  (NORVASC ) 10 MG tablet TAKE 1 TABLET BY MOUTH EVERY DAY 09/16/22  Yes Allwardt, Alyssa M, PA-C  fexofenadine  (ALLEGRA ) 180 MG tablet TAKE 1 TABLET BY MOUTH EVERY DAY 12/16/22  Yes Allwardt, Alyssa M, PA-C  metFORMIN  (GLUCOPHAGE ) 500 MG tablet Take 1 tablet (500 mg total) by mouth 2 (two) times daily with a meal. 09/11/20  Yes  Jonn Nett, DO  metoprolol  succinate (TOPROL -XL) 100 MG 24 hr tablet TAKE 1 TABLET BY MOUTH EVERY DAY EVERY EVENING WITH OR IMMEDIATELY FOLLOWING A MEAL 05/19/23  Yes Allwardt, Alyssa M, PA-C  montelukast  (SINGULAIR ) 10 MG tablet TAKE 1 TABLET BY MOUTH EVERYDAY AT BEDTIME 05/19/23  Yes Allwardt, Alyssa M, PA-C  ondansetron  (ZOFRAN ) 8 MG tablet Take 1 tablet (8 mg total) by mouth 2 (two) times daily as needed for nausea or vomiting. 07/07/23  Yes Arsenio Bigger, MD  pantoprazole  (PROTONIX ) 40 MG tablet TAKE 1 TABLET BY MOUTH TWICE A DAY 06/05/23  Yes Allwardt, Alyssa M, PA-C  rosuvastatin  (CRESTOR ) 10 MG tablet TAKE 1 TABLET BY MOUTH EVERY DAY 06/05/23  Yes  Allwardt, Alyssa M, PA-C  scopolamine  (TRANSDERM-SCOP) 1 MG/3DAYS Place 1 patch (1.5 mg total) onto the skin every 3 (three) days. 06/30/22  Yes Allwardt, Alyssa M, PA-C  valsartan  (DIOVAN ) 320 MG tablet TAKE 1 TABLET BY MOUTH EVERY DAY 07/15/23  Yes Allwardt, Alyssa M, PA-C  venlafaxine  XR (EFFEXOR -XR) 150 MG 24 hr capsule TAKE 1 CAPSULE BY MOUTH EVERY DAY IN THE MORNING 06/08/23  Yes Allwardt, Alyssa M, PA-C  albuterol  (VENTOLIN  HFA) 108 (90 Base) MCG/ACT inhaler Inhale 2 puffs into the lungs every 6 (six) hours as needed for wheezing or shortness of breath. 02/10/22   Farris Hong, PA-C  clonazePAM  (KLONOPIN ) 0.5 MG tablet TAKE 1 TABLET BY MOUTH TWICE A DAY 03/19/23   Allwardt, Alyssa M, PA-C  docusate sodium  (COLACE) 100 MG capsule TAKE 1 CAPSULE BY MOUTH TWICE A DAY 07/02/23   Allwardt, Alyssa M, PA-C  gabapentin  (NEURONTIN ) 100 MG capsule Take 1 capsule (100 mg total) by mouth at bedtime. 10/21/22 06/30/23  Allwardt, Alyssa M, PA-C  ibuprofen  (ADVIL ) 800 MG tablet Take 1 tablet (800 mg total) by mouth every 8 (eight) hours as needed for moderate pain (pain score 4-6) or cramping. Take with food. 04/07/23   Allwardt, Alyssa M, PA-C  lutetium Lu 177 dotatate  (LUTATHERA ) 370 MBQ/ML SOLN Inject 200 millicuries into the vein. Every 2 months.    [provider]  methocarbamol  (ROBAXIN ) 500 MG tablet Take 1 tablet (500 mg total) by mouth every 6 (six) hours as needed for muscle spasms. 04/15/22   Perla Bradford, PA  MOUNJARO  2.5 MG/0.5ML Pen Inject 2.5 mg into the skin once a week.    [provider]  Octreotide  Acetate (SANDOSTATIN  IJ) Inject 1 Dose as directed every 30 (thirty) days.    [provider]  valACYclovir  (VALTREX ) 1000 MG tablet Take 1,000 mg by mouth daily. 04/28/23   [provider]    Current Outpatient Medications  Medication Sig Dispense Refill   amLODipine  (NORVASC ) 10 MG tablet TAKE 1 TABLET BY MOUTH EVERY DAY 90 tablet 3   fexofenadine  (ALLEGRA )  180 MG tablet TAKE 1 TABLET BY MOUTH EVERY DAY 90 tablet 3   metFORMIN  (GLUCOPHAGE ) 500 MG tablet Take 1 tablet (500 mg total) by mouth 2 (two) times daily with a meal. 180 tablet 3   metoprolol  succinate (TOPROL -XL) 100 MG 24 hr tablet TAKE 1 TABLET BY MOUTH EVERY DAY EVERY EVENING WITH OR IMMEDIATELY FOLLOWING A MEAL 90 tablet 1   montelukast  (SINGULAIR ) 10 MG tablet TAKE 1 TABLET BY MOUTH EVERYDAY AT BEDTIME 90 tablet 2   ondansetron  (ZOFRAN ) 8 MG tablet Take 1 tablet (8 mg total) by mouth 2 (two) times daily as needed for nausea or vomiting. 20 tablet 0   pantoprazole  (PROTONIX ) 40 MG tablet TAKE  1 TABLET BY MOUTH TWICE A DAY 180 tablet 0   rosuvastatin  (CRESTOR ) 10 MG tablet TAKE 1 TABLET BY MOUTH EVERY DAY 90 tablet 0   scopolamine  (TRANSDERM-SCOP) 1 MG/3DAYS Place 1 patch (1.5 mg total) onto the skin every 3 (three) days. 10 patch 12   valsartan  (DIOVAN ) 320 MG tablet TAKE 1 TABLET BY MOUTH EVERY DAY 90 tablet 0   venlafaxine  XR (EFFEXOR -XR) 150 MG 24 hr capsule TAKE 1 CAPSULE BY MOUTH EVERY DAY IN THE MORNING 90 capsule 1   albuterol  (VENTOLIN  HFA) 108 (90 Base) MCG/ACT inhaler Inhale 2 puffs into the lungs every 6 (six) hours as needed for wheezing or shortness of breath. 8 g 0   clonazePAM  (KLONOPIN ) 0.5 MG tablet TAKE 1 TABLET BY MOUTH TWICE A DAY 60 tablet 0   docusate sodium  (COLACE) 100 MG capsule TAKE 1 CAPSULE BY MOUTH TWICE A DAY 30 capsule 0   gabapentin  (NEURONTIN ) 100 MG capsule Take 1 capsule (100 mg total) by mouth at bedtime. 90 capsule 3   ibuprofen  (ADVIL ) 800 MG tablet Take 1 tablet (800 mg total) by mouth every 8 (eight) hours as needed for moderate pain (pain score 4-6) or cramping. Take with food. 30 tablet 1   lutetium Lu 177 dotatate  (LUTATHERA ) 370 MBQ/ML SOLN Inject 200 millicuries into the vein. Every 2 months.     methocarbamol  (ROBAXIN ) 500 MG tablet Take 1 tablet (500 mg total) by mouth every 6 (six) hours as needed for muscle spasms. 40 tablet 0   MOUNJARO  2.5  MG/0.5ML Pen Inject 2.5 mg into the skin once a week.     Octreotide  Acetate (SANDOSTATIN  IJ) Inject 1 Dose as directed every 30 (thirty) days.     valACYclovir  (VALTREX ) 1000 MG tablet Take 1,000 mg by mouth daily.     Current Facility-Administered Medications  Medication Dose Route Frequency Provider Last Rate Last Admin   0.9 %  sodium chloride  infusion  500 mL Intravenous Once Daina Drum, MD        Allergies as of 07/28/2023   (No Known Allergies)    Family History  Problem Relation Age of Onset   Breast cancer Mother 19   Lupus Mother    Heart failure Father    Hypertension Father    Hyperlipidemia Father    Sudden death Father    Obesity Father    Breast cancer Maternal Aunt 57   Ovarian cancer Maternal Aunt 60   Breast cancer Maternal Aunt 40   Arthritis Maternal Grandmother    Arthritis Maternal Grandfather    Arthritis Paternal Grandmother    Arthritis Paternal Grandfather    Heart failure Paternal Grandfather    Colon polyps Neg Hx    Colon cancer Neg Hx    Esophageal cancer Neg Hx    Stomach cancer Neg Hx    Rectal cancer Neg Hx     Social History   Socioeconomic History   Marital status: Married    Spouse name: Dovie Gell    Number of children: 2   Years of education: Not on file   Highest education level: Bachelor's degree (e.g., BA, AB, BS)  Occupational History   Occupation: Stay at Home Mom  Tobacco Use   Smoking status: Never   Smokeless tobacco: Never   Tobacco comments:    never used tobacco  Vaping Use   Vaping status: Never Used  Substance and Sexual Activity   Alcohol use: No    Alcohol/week: 0.0 standard drinks of  alcohol   Drug use: No   Sexual activity: Yes    Partners: Male    Birth control/protection: Surgical    Comment: hysterectomy  Other Topics Concern   Not on file  Social History Narrative   Married to [place]. 2 sons (2001) Goble Last and Fabio Holts (1999). Has dog. She is unemployed, has worked in the past (office work).  Completed some college. Enjoys Engineer, materials.   Social Drivers of Corporate investment banker Strain: Low Risk  (03/10/2023)   Overall Financial Resource Strain (CARDIA)    Difficulty of Paying Living Expenses: Not hard at all  Food Insecurity: No Food Insecurity (03/10/2023)   Hunger Vital Sign    Worried About Running Out of Food in the Last Year: Never true    Ran Out of Food in the Last Year: Never true  Transportation Needs: No Transportation Needs (03/10/2023)   PRAPARE - Administrator, Civil Service (Medical): No    Lack of Transportation (Non-Medical): No  Physical Activity: Insufficiently Active (03/10/2023)   Exercise Vital Sign    Days of Exercise per Week: 2 days    Minutes of Exercise per Session: 20 min  Stress: No Stress Concern Present (03/10/2023)   [employer] of Occupational Health - Occupational Stress Questionnaire    Feeling of Stress : Only a little  Social Connections: Moderately Isolated (03/10/2023)   Social Connection and Isolation Panel    Frequency of Communication with Friends and Family: Once a week    Frequency of Social Gatherings with Friends and Family: Once a week    Attends Religious Services: More than 4 times per year    Active Member of [employer] or Organizations: No    Attends Banker Meetings: Not on file    Marital Status: Married  Catering manager Violence: Not At Risk (04/14/2022)   Humiliation, Afraid, Rape, and Kick questionnaire    Fear of Current or Ex-Partner: No    Emotionally Abused: No    Physically Abused: No    Sexually Abused: No    Physical Exam: Vital signs in last 24 hours: BP 138/85   Pulse 82   Temp 98.1 F (36.7 C) (Skin)   Ht 4' 11.5 (1.511 m)   Wt 168 lb (76.2 kg)   SpO2 98%   BMI 33.36 kg/m  GEN: NAD EYE: Sclerae anicteric ENT: MMM CV: Non-tachycardic Pulm: No increased work of breathing GI: Soft, NT/ND NEURO:  Alert & Oriented   Regino Caprio, MD [facility]  07/28/2023 10:18 AM

## 2023-07-28 NOTE — Op Note (Signed)
 [facility] Patient Name: Leah [name] Procedure Date: 07/28/2023 10:55 AM MRN: [id number] Endoscopist: Pedro Bourgeois , , [id number] Age: 63 Referring MD:  Date of Birth: 08-14-1960 Gender: Female Account #: [id number] Procedure:                Colonoscopy Indications:              Screening for colorectal malignant neoplasm Medicines:                Monitored Anesthesia Care Procedure:                Pre-Anesthesia Assessment:                           - Prior to the procedure, a History and Physical                            was performed, and patient medications and                            allergies were reviewed. The patient's tolerance of                            previous anesthesia was also reviewed. The risks                            and benefits of the procedure and the sedation                            options and risks were discussed with the patient.                            All questions were answered, and informed consent                            was obtained. Prior Anticoagulants: The patient has                            taken no anticoagulant or antiplatelet agents. ASA                            Grade Assessment: II - A patient with mild systemic                            disease. After reviewing the risks and benefits,                            the patient was deemed in satisfactory condition to                            undergo the procedure.                           After obtaining informed consent, the colonoscope  was passed under direct vision. Throughout the                            procedure, the patient's blood pressure, pulse, and                            oxygen  saturations were monitored continuously. The                            CF [id number] #[id number] was introduced through the anus                            and advanced to the the terminal ileum. The                            colonoscopy  was performed without difficulty. The                            patient tolerated the procedure well. The quality                            of the bowel preparation was excellent. The                            terminal ileum, ileocecal valve, appendiceal                            orifice, and rectum were photographed. Scope In: 11:05:36 AM Scope Out: 11:20:45 AM Scope Withdrawal Time: 0 hours 11 minutes 25 seconds  Total Procedure Duration: 0 hours 15 minutes 9 seconds  Findings:                 The terminal ileum appeared normal.                           Multiple diverticula were found in the sigmoid                            colon.                           Non-bleeding internal hemorrhoids were found during                            retroflexion. Complications:            No immediate complications. Estimated Blood Loss:     Estimated blood loss: none. Impression:               - The examined portion of the ileum was normal.                           - Diverticulosis in the sigmoid colon.                           - Non-bleeding internal hemorrhoids.                           -  No specimens collected. Recommendation:           - Discharge patient to home (with escort).                           - Repeat colonoscopy in 10 years for screening                            purposes.                           - The findings and recommendations were discussed                            with the patient. Dr Apolonio Kluver,  07/28/2023 11:23:17 AM

## 2023-07-29 ENCOUNTER — Telehealth: Payer: Self-pay

## 2023-07-29 NOTE — Telephone Encounter (Signed)
  Follow up Call-     07/28/2023   10:00 AM  Call back number  Post procedure Call Back phone  # [phone]  Permission to leave phone message Yes     Patient questions:  Do you have a fever, pain , or abdominal swelling? No. Pain Score  0 *  Have you tolerated food without any problems? Yes.    Have you been able to return to your normal activities? Yes.    Do you have any questions about your discharge instructions: Diet   No. Medications  No. Follow up visit  No.  Do you have questions or concerns about your Care? No.  Actions: * If pain score is 4 or above: No action needed, pain <4.

## 2023-08-04 DIAGNOSIS — Z9889 Other specified postprocedural states: Secondary | ICD-10-CM | POA: Diagnosis not present

## 2023-08-04 DIAGNOSIS — C7A8 Other malignant neuroendocrine tumors: Secondary | ICD-10-CM | POA: Diagnosis not present

## 2023-08-04 DIAGNOSIS — D3A8 Other benign neuroendocrine tumors: Secondary | ICD-10-CM | POA: Diagnosis not present

## 2023-08-04 DIAGNOSIS — I10 Essential (primary) hypertension: Secondary | ICD-10-CM | POA: Diagnosis not present

## 2023-08-04 DIAGNOSIS — Z9013 Acquired absence of bilateral breasts and nipples: Secondary | ICD-10-CM | POA: Diagnosis not present

## 2023-08-06 ENCOUNTER — Inpatient Hospital Stay: Attending: Hematology & Oncology

## 2023-08-06 ENCOUNTER — Inpatient Hospital Stay: Admitting: Hematology & Oncology

## 2023-08-06 ENCOUNTER — Ambulatory Visit

## 2023-08-06 DIAGNOSIS — E782 Mixed hyperlipidemia: Secondary | ICD-10-CM | POA: Diagnosis not present

## 2023-08-06 DIAGNOSIS — G4733 Obstructive sleep apnea (adult) (pediatric): Secondary | ICD-10-CM | POA: Diagnosis not present

## 2023-08-06 DIAGNOSIS — E1159 Type 2 diabetes mellitus with other circulatory complications: Secondary | ICD-10-CM | POA: Diagnosis not present

## 2023-08-06 DIAGNOSIS — E1169 Type 2 diabetes mellitus with other specified complication: Secondary | ICD-10-CM | POA: Diagnosis not present

## 2023-08-07 ENCOUNTER — Ambulatory Visit: Payer: Self-pay

## 2023-08-07 ENCOUNTER — Encounter: Payer: Self-pay | Admitting: Cardiovascular Disease

## 2023-08-07 ENCOUNTER — Encounter: Payer: Self-pay | Admitting: Physician Assistant

## 2023-08-07 NOTE — Telephone Encounter (Signed)
 Noted; pt scheduled with PCP 08/11/23

## 2023-08-07 NOTE — Telephone Encounter (Signed)
 Please see pt msg sent via MyChart and have patient triaged due to symptoms mentioning SOB and dizziness

## 2023-08-07 NOTE — Telephone Encounter (Signed)
 FYI Only or Action Required?: FYI only for provider.  Patient was last seen in primary care on 03/10/2023 by [name], MD. Called Nurse Triage reporting Dizziness. Symptoms began several months ago. Interventions attempted: Rest, hydration, or home remedies. Symptoms are: gradually worsening.  Triage Disposition: See PCP When Office is Open (Within 3 Days)  Patient/caregiver understands and will follow disposition?: Yes     Reason for Disposition  [1] MODERATE dizziness (e.g., interferes with normal activities) AND [2] has been evaluated by doctor (or NP/PA) for this  Answer Assessment - Initial Assessment Questions 1. DESCRIPTION: Describe your dizziness.     If I stand up or if I'm been doing things.Leah [name]  get really dizzy and off balance  I get winded at the same time 2. LIGHTHEADED: Do you feel lightheaded? (e.g., somewhat faint, woozy, weak upon standing)     Weak upon standing 3. VERTIGO: Do you feel like either you or the room is spinning or tilting? (i.e. vertigo)     Floor is moving sometimes 4. SEVERITY: How bad is it?  Do you feel like you are going to faint? Can you stand and walk?   - MILD: Feels slightly dizzy, but walking normally.   - MODERATE: Feels unsteady when walking, but not falling; interferes with normal activities (e.g., school, work).   - SEVERE: Unable to walk without falling, or requires assistance to walk without falling; feels like passing out now.      Mild-moderate 5. ONSET:  When did the dizziness begin?     X few months, worsening/more consistent now 6. AGGRAVATING FACTORS: Does anything make it worse? (e.g., standing, change in head position)     Sitting to standing, change of head position 7. HEART RATE: Can you tell me your heart rate? How many beats in 15 seconds?  (Note: not all patients can do this)       N/a 8. CAUSE: What do you think is causing the dizziness?     unknown 9. RECURRENT SYMPTOM: Have you had  dizziness before? If Yes, ask: When was the last time? What happened that time?     More consistently recently 10. OTHER SYMPTOMS: Do you have any other symptoms? (e.g., fever, chest pain, vomiting, diarrhea, bleeding)       Vomiting, diarrhea - likely r/t current cancer tx, pt reports at baseline Intermittent SOB during dizzy event only 11. PREGNANCY: Is there any chance you are pregnant? When was your last menstrual period?       N/a    Of note, pt has significant PMH including current ongoing cancer tx. Pt does endorse that her ongoing treatment does cause current sx, but unsure if there is another underlying acute cause since sx is becoming more frequent lately.  Protocols used: Dizziness - Lightheadedness-A-AH

## 2023-08-11 ENCOUNTER — Other Ambulatory Visit (HOSPITAL_COMMUNITY): Payer: BC Managed Care – PPO

## 2023-08-11 ENCOUNTER — Ambulatory Visit: Admitting: Physician Assistant

## 2023-08-11 VITALS — Wt 174.2 lb

## 2023-08-11 DIAGNOSIS — E1159 Type 2 diabetes mellitus with other circulatory complications: Secondary | ICD-10-CM | POA: Diagnosis not present

## 2023-08-11 DIAGNOSIS — I951 Orthostatic hypotension: Secondary | ICD-10-CM

## 2023-08-11 DIAGNOSIS — I152 Hypertension secondary to endocrine disorders: Secondary | ICD-10-CM | POA: Diagnosis not present

## 2023-08-11 DIAGNOSIS — D649 Anemia, unspecified: Secondary | ICD-10-CM | POA: Diagnosis not present

## 2023-08-11 DIAGNOSIS — Z7985 Long-term (current) use of injectable non-insulin antidiabetic drugs: Secondary | ICD-10-CM

## 2023-08-11 NOTE — Patient Instructions (Signed)
  VISIT SUMMARY: You visited us  today due to dizziness and imbalance, which have been worsening over the past month. We discussed your current medications and recent health changes, including your blood pressure, anemia, and treatment for a neuroendocrine tumor.  YOUR PLAN: ORTHOSTATIC HYPOTENSION: You experience dizziness when standing up, which is likely due to a drop in blood pressure upon standing. -Stop taking amlodipine . -Monitor your blood pressure at home once or twice daily using a cuff. -Increase your fluid intake, including electrolyte drinks like Propel. -Follow up in two weeks to reassess your symptoms and blood pressure management.  HYPERTENSION: Your blood pressure needs may have changed due to recent improvements in your health. -Continue taking valsartan  320 mg. -Continue taking metoprolol  as previously prescribed. -Monitor your blood pressure at home.  ANEMIA: Your hemoglobin levels are slightly low, which we are monitoring as it may contribute to your symptoms. -We will review your upcoming lab results, including a complete blood count (CBC), to monitor your hemoglobin levels.  NEUROENDOCRINE TUMOR: Your recent MRI shows that your tumor has shrunk, likely due to your Lutathera  treatments. You are experiencing some diarrhea, which may be related to the treatments. -Continue your current treatment regimen. -Monitor your symptoms and report any significant changes.  DIABETES MANAGEMENT: You are managing your diabetes with Mounjaro  and metformin . There was a recent dosing error with metformin  that caused increased diarrhea. -Ensure you are taking the correct dose of metformin . -Continue taking Mounjaro  at the current dose.                      Contains text generated by [employer]bridge.                                 Contains text generated by [employer]bridge.

## 2023-08-11 NOTE — Progress Notes (Signed)
 Patient ID: Leah [name], female    DOB: 1960-08-03, 63 y.o.   MRN: [id number]   Assessment & Plan:  There are no diagnoses linked to this encounter.    Assessment and Plan Assessment & Plan Orthostatic Hypotension Experiences dizziness upon standing, occurring most days for the past month. Orthostatic blood pressure readings showed a drop from 130 mmHg when lying down to 110 mmHg when standing, indicating a 20-point drop. This suggests orthostatic hypotension, likely exacerbated by her current antihypertensive regimen. Improved health and weight loss may have reduced her need for aggressive blood pressure management. Discussed the possibility of reducing antihypertensive medication to address symptoms. - Discontinue amlodipine  - Monitor blood pressure at home using a cuff once or twice daily - Increase fluid intake, including electrolyte drinks like Propel - Follow up in two weeks to reassess symptoms and blood pressure management  Hypertension Currently on multiple antihypertensive medications. Blood pressure was historically 110/60 mmHg, but recent improvements in health may have altered her blood pressure needs. Considering adjusting medication regimen to better suit current health status. - Continue valsartan  320 mg - Continue metoprolol  as previously prescribed - Monitor blood pressure at home  Anemia Hemoglobin is low at 11.4 g/dL, down from 88.1 g/dL previously. No known source of bleeding, and recent colonoscopy was normal. Monitoring as a potential contributing factor to symptoms. - Review upcoming lab results, including CBC, to monitor hemoglobin levels  Diabetes Management Managing diabetes with Mounjaro  and metformin , although there was a recent dosing error with metformin  causing increased diarrhea. Discussed ensuring correct dosing to prevent further issues. - Ensure correct dosing of metformin  - Continue Mounjaro  at current dose      Return in about 2 weeks  (around 08/25/2023) for recheck/follow-up, blood pressure check.    Subjective:    Chief Complaint  Patient presents with   Dizziness    Pt has been experiencing dizziness while standing. She states she did have vertigo in the past and not sure if its that or something else.    Dizziness   Discussed the use of AI scribe software for clinical note transcription with the patient, who gave verbal consent to proceed.  History of Present Illness Leah [name] is a 63 year old female with hypertension and vertigo who presents with dizziness and imbalance. She is accompanied by her husband.  She experiences dizziness and imbalance, particularly when standing up, describing it as feeling like 'wearing somebody else's glasses' or 'like I just got off a merry-go-round.' These symptoms have been worsening over the past month, occurring most days and sometimes accompanied by nausea. She has a long-standing history of vertigo.  She is on multiple medications for hypertension, including amlodipine  10 mg, metoprolol  XL 100 mg, and valsartan  320 mg. She recalls a past incident where a blood pressure medication caused her to pass out, leading her to discontinue it. Missing a dose of metoprolol  previously resulted in a significant increase in her heart rate.  She has been on Mounjaro  for about a year, which she started after experiencing excessive nausea with Ozempic . She is on the lowest dose of 2.5 mg and sometimes pauses the medication for tests but does not notice a significant change in her dizziness when off it.  She underwent a colonoscopy two weeks ago and an MRI last week, which showed her tumor had shrunk. She has been receiving Lutathera  treatments, which have also caused diarrhea. An incident where her metformin  dosage was mistakenly doubled led to increased  diarrhea and feeling 'loopy.'  Her hemoglobin levels have been slightly low, with recent readings of 11.4 and 11.8. She is not aware of  any bleeding and her recent colonoscopy was normal. She feels generally overtired and off balance, even when not experiencing dizziness.  Her social history includes living with her husband and grandson, and she uses a walker for mobility, especially in public places like [place]  parks.     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   Constipation    Diabetes (HCC)    Dyspnea    with exertion   Fatty liver    Gallbladder problem    GE reflux    Hepatic encephalopathy (HCC) 2011   History of PCR DNA positive for HSV1    History of retinal vein occlusion, right eye    HTN (hypertension) 2011   Joint pain    Liver tumor    Neuroendocrine tumor 2011   Orthostatic hypotension    Osteoarthritis    Osteopenia    Palpitations    Sarcoidosis 2008   Sleep apnea    uses cpap   Surgical menopause     Past Surgical History:  Procedure Laterality Date   ABDOMINAL HYSTERECTOMY  2004   AUGMENTATION MAMMAPLASTY     BREAST BIOPSY Left    BREAST RECONSTRUCTION Bilateral 05/07/2020   Procedure: Revision of bilateral breast reconstruction;  Surgeon: Elisabeth Craig RAMAN, MD;  Location: [facility];  Service: Plastics;  Laterality: Bilateral;  2 hours   CESAREAN SECTION  [id number]   CHOLECYSTECTOMY  2006   HERNIA REPAIR     KNEE ARTHROSCOPY Left 09/2010   LAPAROSCOPIC HEPATECTOMY     LIPOSUCTION Bilateral 05/07/2020   Procedure: LIPOSUCTION with excision of lateral breast and axillary tissue;  Surgeon: Elisabeth Craig RAMAN, MD;  Location:  [facility];  Service: Plastics;  Laterality: Bilateral;   LIVER SURGERY     MASTECTOMY Bilateral 2008   NASAL SINUS SURGERY     OOPHORECTOMY  2004   RECONSTRUCTION BREAST W/ LATISSIMUS DORSI FLAP Bilateral    TONSILLECTOMY  1976   TOTAL KNEE ARTHROPLASTY Left 10/29/2020    Procedure: TOTAL KNEE ARTHROPLASTY;  Surgeon: Melodi Lerner, MD;  Location: [facility];  Service: Orthopedics;  Laterality: Left;   TOTAL KNEE ARTHROPLASTY Right 04/14/2022   Procedure: TOTAL KNEE ARTHROPLASTY;  Surgeon: Melodi Lerner, MD;  Location: [facility];  Service: Orthopedics;  Laterality: Right;   TUBAL LIGATION Bilateral 2001    Family History  Problem Relation Age of Onset   Breast cancer Mother 66   Lupus Mother    Heart failure Father    Hypertension Father    Hyperlipidemia Father    Sudden death Father    Obesity Father    Breast cancer Maternal Aunt 64   Ovarian cancer Maternal Aunt 60   Breast cancer Maternal Aunt 40   Arthritis Maternal Grandmother    Arthritis Maternal Grandfather    Arthritis Paternal Grandmother    Arthritis Paternal Grandfather    Heart failure Paternal Grandfather    Colon polyps Neg Hx    Colon cancer Neg Hx    Esophageal cancer Neg Hx    Stomach cancer Neg Hx    Rectal cancer Neg Hx     Social History  Tobacco Use   Smoking status: Never   Smokeless tobacco: Never   Tobacco comments:    never used tobacco  Vaping Use   Vaping status: Never Used  Substance Use Topics   Alcohol use: No    Alcohol/week: 0.0 standard drinks of alcohol   Drug use: No     No Known Allergies  Review of Systems  Neurological:  Positive for dizziness.   NEGATIVE UNLESS OTHERWISE INDICATED IN HPI      Objective:     Wt 174 lb 3.2 oz (79 kg)   SpO2 98%   BMI 34.60 kg/m   Wt Readings from Last 3 Encounters:  08/11/23 174 lb 3.2 oz (79 kg)  07/28/23 168 lb (76.2 kg)  06/30/23 168 lb (76.2 kg)    BP Readings from Last 3 Encounters:  07/28/23 120/60  07/07/23 (!) 160/88  06/05/23 133/67     Physical Exam Vitals and nursing note reviewed.  Constitutional:      Appearance: Normal appearance. She is normal weight. She is not toxic-appearing.  HENT:     Head: Normocephalic and atraumatic.     Mouth/Throat:     Mouth: Mucous membranes are  dry.   Eyes:     Extraocular Movements: Extraocular movements intact.     Conjunctiva/sclera: Conjunctivae normal.     Pupils: Pupils are equal, round, and reactive to light.    Cardiovascular:     Rate and Rhythm: Normal rate and regular rhythm.     Pulses: Normal pulses.     Heart sounds: Normal heart sounds.  Pulmonary:     Effort: Pulmonary effort is normal.     Breath sounds: Normal breath sounds.  Abdominal:     General: Bowel sounds are normal.   Musculoskeletal:        General: Normal range of motion.     Cervical back: Normal range of motion and neck supple.   Skin:    General: Skin is warm and dry.   Neurological:     General: No focal deficit present.     Mental Status: She is alert and oriented to person, place, and time.     Cranial Nerves: No cranial nerve deficit.     Sensory: No sensory deficit.     Motor: No weakness.     Gait: Gait normal.   Psychiatric:        Mood and Affect: Mood normal.        Behavior: Behavior normal.             Sharlisa Hollifield M Antonia Culbertson, PA-C

## 2023-08-19 ENCOUNTER — Inpatient Hospital Stay

## 2023-08-19 ENCOUNTER — Encounter: Payer: Self-pay | Admitting: Family

## 2023-08-19 ENCOUNTER — Inpatient Hospital Stay: Attending: Hematology & Oncology

## 2023-08-19 ENCOUNTER — Inpatient Hospital Stay (HOSPITAL_BASED_OUTPATIENT_CLINIC_OR_DEPARTMENT_OTHER): Admitting: Family

## 2023-08-19 VITALS — BP 135/79 | HR 82 | Temp 99.4°F | Resp 17 | Wt 174.1 lb

## 2023-08-19 DIAGNOSIS — C7B8 Other secondary neuroendocrine tumors: Secondary | ICD-10-CM

## 2023-08-19 DIAGNOSIS — R978 Other abnormal tumor markers: Secondary | ICD-10-CM | POA: Insufficient documentation

## 2023-08-19 DIAGNOSIS — C7B02 Secondary carcinoid tumors of liver: Secondary | ICD-10-CM | POA: Insufficient documentation

## 2023-08-19 DIAGNOSIS — C7A00 Malignant carcinoid tumor of unspecified site: Secondary | ICD-10-CM | POA: Insufficient documentation

## 2023-08-19 LAB — CMP (CANCER CENTER ONLY)
ALT: 13 U/L (ref 0–44)
AST: 15 U/L (ref 15–41)
Albumin: 4.1 g/dL (ref 3.5–5.0)
Alkaline Phosphatase: 72 U/L (ref 38–126)
Anion gap: 6 (ref 5–15)
BUN: 15 mg/dL (ref 8–23)
CO2: 27 mmol/L (ref 22–32)
Calcium: 9.1 mg/dL (ref 8.9–10.3)
Chloride: 105 mmol/L (ref 98–111)
Creatinine: 0.81 mg/dL (ref 0.44–1.00)
GFR, Estimated: >60 mL/min (ref >60)
Glucose, Bld: 124 mg/dL — ABNORMAL HIGH (ref 70–99)
Potassium: 4 mmol/L (ref 3.5–5.1)
Sodium: 138 mmol/L (ref 135–145)
Total Bilirubin: 0.5 mg/dL (ref 0.0–1.2)
Total Protein: 7.1 g/dL (ref 6.5–8.1)

## 2023-08-19 LAB — CBC WITH DIFFERENTIAL (CANCER CENTER ONLY)
Abs Immature Granulocytes: 0.02 K/uL (ref 0.00–0.07)
Basophils Absolute: 0 K/uL (ref 0.0–0.1)
Basophils Relative: 1 %
Eosinophils Absolute: 0.2 K/uL (ref 0.0–0.5)
Eosinophils Relative: 3 %
HCT: 33.1 % — ABNORMAL LOW (ref 36.0–46.0)
Hemoglobin: 11.2 g/dL — ABNORMAL LOW (ref 12.0–15.0)
Immature Granulocytes: 0 %
Lymphocytes Relative: 21 %
Lymphs Abs: 1.3 K/uL (ref 0.7–4.0)
MCH: 31.5 pg (ref 26.0–34.0)
MCHC: 33.8 g/dL (ref 30.0–36.0)
MCV: 93.2 fL (ref 80.0–100.0)
Monocytes Absolute: 0.4 K/uL (ref 0.1–1.0)
Monocytes Relative: 7 %
Neutro Abs: 4.2 K/uL (ref 1.7–7.7)
Neutrophils Relative %: 68 %
Platelet Count: 173 K/uL (ref 150–400)
RBC: 3.55 MIL/uL — ABNORMAL LOW (ref 3.87–5.11)
RDW: 13.2 % (ref 11.5–15.5)
WBC Count: 6.1 K/uL (ref 4.0–10.5)
nRBC: 0 % (ref 0.0–0.2)

## 2023-08-19 NOTE — Progress Notes (Signed)
 Hematology and Oncology Follow Up Visit  Leah [name] [id number] [birth year] 63 y.o. 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 She is doing fairly well. She has some fatigue as well as nausea at times.  She has had 3 treatments with Lutathera  and Sandostatin . She has a 4th cycle planned for the end of the month.  MRI 2 weeks ago showed a slight interval decrease in size of the metastatic lesion along the lepatic resection margin and other lesions appear stable.  She notes an upset stomach for several days after each cycle.  Twinges of abdominal discomfort come and go.  She has some dizziness that comes and goes. PCP recently stopped her Amlodipine  for orthostatic hypotension.  No fever, chills, cough, rash, SOB, chest pain, palpitations or changes in bladder habits.  No swelling, numbness or tingling in her extremities at this time.  No falls or syncope reported.  Appetite and hydration are good. Weight is stable at 174 lbs.   ECOG Performance Status: 1 - Symptomatic but completely ambulatory  Medications:  Allergies as of 08/19/2023   No Known Allergies      Medication List        Accurate as of August 19, 2023  1:07 PM. If you have any questions, ask your nurse or doctor.          albuterol  108 (90 Base) MCG/ACT inhaler Commonly known as: VENTOLIN  HFA Inhale 2 puffs into the lungs every 6 (six) hours as needed for wheezing or shortness of breath.   amLODipine  10 MG tablet Commonly known as: NORVASC  TAKE 1 TABLET BY MOUTH EVERY DAY   clonazePAM  0.5 MG tablet Commonly known as: KLONOPIN  TAKE 1 TABLET BY MOUTH TWICE A DAY   docusate sodium  100 MG capsule Commonly known as:  COLACE TAKE 1 CAPSULE BY MOUTH TWICE A DAY   fexofenadine  180 MG tablet Commonly known as: ALLEGRA  TAKE 1 TABLET BY MOUTH EVERY DAY   gabapentin  100 MG capsule Commonly known as: NEURONTIN  Take 1 capsule (100 mg total) by mouth at bedtime.   ibuprofen  800 MG tablet Commonly known as: ADVIL  Take 1 tablet (800 mg total) by mouth every 8 (eight) hours as needed for moderate pain (pain score 4-6) or cramping. Take with food.   Lutathera  370 MBQ/ML Soln Generic drug: lutetium Lu 177 dotatate  Inject 200 millicuries into the vein. Every 2 months.   metFORMIN  500 MG tablet Commonly known as: GLUCOPHAGE  Take 1 tablet (500 mg total) by mouth 2 (two) times daily with a meal.   methocarbamol  500 MG tablet Commonly known as: ROBAXIN  Take 1 tablet (500 mg total) by mouth every 6 (six) hours as needed for muscle spasms.   metoprolol  succinate 100 MG 24 hr tablet Commonly known as: TOPROL -XL TAKE 1 TABLET BY MOUTH EVERY DAY EVERY EVENING WITH OR IMMEDIATELY FOLLOWING A MEAL   montelukast  10 MG tablet Commonly known as: SINGULAIR  TAKE 1 TABLET BY MOUTH EVERYDAY AT BEDTIME   Mounjaro  2.5 MG/0.5ML Pen Generic drug: tirzepatide  Inject 2.5 mg into the skin once a week.   ondansetron  8 MG tablet Commonly known as: ZOFRAN  Take 1 tablet (8  mg total) by mouth 2 (two) times daily as needed for nausea or vomiting.   pantoprazole  40 MG tablet Commonly known as: PROTONIX  TAKE 1 TABLET BY MOUTH TWICE A DAY   rosuvastatin  10 MG tablet Commonly known as: CRESTOR  TAKE 1 TABLET BY MOUTH EVERY DAY   SANDOSTATIN  IJ Inject 1 Dose as directed every 30 (thirty) days.   scopolamine  1 MG/3DAYS Commonly known as: TRANSDERM-SCOP Place 1 patch (1.5 mg total) onto the skin every 3 (three) days.   valACYclovir  1000 MG tablet Commonly known as: VALTREX  Take 1,000 mg by mouth daily.   valsartan  320 MG tablet Commonly known as: DIOVAN  TAKE 1 TABLET BY MOUTH EVERY DAY   venlafaxine  XR 150 MG 24 hr  capsule Commonly known as: EFFEXOR -XR TAKE 1 CAPSULE BY MOUTH EVERY DAY IN THE MORNING        Allergies: No Known Allergies  Past Medical History, Surgical history, Social history, and Family History were reviewed and updated.  Review of Systems: All other 10 point review of systems is negative.   Physical Exam:  vitals were not taken for this visit.   Wt Readings from Last 3 Encounters:  08/11/23 174 lb 3.2 oz (79 kg)  07/28/23 168 lb (76.2 kg)  06/30/23 168 lb (76.2 kg)    Ocular: Sclerae unicteric, pupils equal, round and reactive to light Ear-nose-throat: Oropharynx clear, dentition fair Lymphatic: No cervical or supraclavicular adenopathy Lungs no rales or rhonchi, good excursion bilaterally Heart regular rate and rhythm, no murmur appreciated Abd soft, nontender, positive bowel sounds MSK no focal spinal tenderness, no joint edema Neuro: non-focal, well-oriented, appropriate affect Breasts: Deferred   Lab Results  Component Value Date   WBC 6.1 08/19/2023   HGB 11.2 (L) 08/19/2023   HCT 33.1 (L) 08/19/2023   MCV 93.2 08/19/2023   PLT 173 08/19/2023   Lab Results  Component Value Date   FERRITIN 382 (H) 10/18/2008   IRON <10 (L) 10/18/2008   TIBC NOT CALC Not calculated due to Iron <10. 10/18/2008   UIBC 156 10/18/2008   IRONPCTSAT NOT CALC Not calculated due to Iron <10. 10/18/2008   Lab Results  Component Value Date   RETICCTPCT 0.9 10/18/2008   RBC 3.55 (L) 08/19/2023   No results found for: KPAFRELGTCHN, LAMBDASER, KAPLAMBRATIO No results found for: KIMBERLY LE, IGMSERUM No results found for: STEPHANY CARLOTA BENSON MARKEL EARLA JOANNIE DOC, MSPIKE, SPEI   Chemistry      Component Value Date/Time   NA 136 07/07/2023 0855   NA 140 01/20/2017 1416   NA 141 01/17/2016 1047   K 4.3 07/07/2023 0855   K 3.9 01/20/2017 1416   K 3.2 (L) 01/17/2016 1047   CL 104 07/07/2023 0855   CL 102 01/20/2017 1416    CO2 24 07/07/2023 0855   CO2 26 01/20/2017 1416   CO2 27 01/17/2016 1047   BUN 19 07/07/2023 0855   BUN 15 01/20/2017 1416   BUN 6.6 (L) 01/17/2016 1047   CREATININE 0.79 07/07/2023 0855   CREATININE 0.68 06/05/2023 0942   CREATININE 1.1 01/20/2017 1416   CREATININE 0.7 01/17/2016 1047      Component Value Date/Time   CALCIUM  8.9 07/07/2023 0855   CALCIUM  9.1 01/20/2017 1416   CALCIUM  8.7 01/17/2016 1047   ALKPHOS 72 07/07/2023 0855   ALKPHOS 84 01/20/2017 1416   ALKPHOS 90 01/17/2016 1047   AST 16 07/07/2023 0855   AST 13 (L) 06/05/2023 0942   AST 34 01/17/2016 1047  ALT 11 07/07/2023 0855   ALT 11 06/05/2023 0942   ALT 35 01/20/2017 1416   ALT 38 01/17/2016 1047   BILITOT 0.9 07/07/2023 0855   BILITOT 0.3 06/05/2023 0942   BILITOT 0.42 01/17/2016 1047       Impression and Plan: Ms. Gaulin is a very pleasant 63 yo caucasian female with long standing history of metastatic carcinoid with liver metastasis. Chromogranin A pending.  We will plan to see her 4 weeks after her 4th cycle of Lutathera  and resume Somatuline.  Follow-up in 8 weeks.     Lauraine Pepper, NP 7/9/20251:07 PM

## 2023-08-21 LAB — CHROMOGRANIN A: Chromogranin A (ng/mL): 188.3 ng/mL — ABNORMAL HIGH (ref 0.0–101.8)

## 2023-08-25 ENCOUNTER — Encounter: Payer: Self-pay | Admitting: Physician Assistant

## 2023-08-25 ENCOUNTER — Ambulatory Visit (INDEPENDENT_AMBULATORY_CARE_PROVIDER_SITE_OTHER): Admitting: Physician Assistant

## 2023-08-25 VITALS — BP 114/82 | HR 94 | Temp 98.1°F | Ht 59.5 in | Wt 173.8 lb

## 2023-08-25 DIAGNOSIS — C7B8 Other secondary neuroendocrine tumors: Secondary | ICD-10-CM

## 2023-08-25 DIAGNOSIS — I951 Orthostatic hypotension: Secondary | ICD-10-CM | POA: Diagnosis not present

## 2023-08-25 DIAGNOSIS — R42 Dizziness and giddiness: Secondary | ICD-10-CM | POA: Diagnosis not present

## 2023-08-25 NOTE — Progress Notes (Signed)
 Patient ID: KYASIA [name], female    DOB: January 15, 1961, 63 y.o.   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 Recent increase in chromogranin A levels from 86 to 188 suggests potential changes in tumor activity. Hemoglobin levels have decreased from 11.8 to 11.2 over the past two months, possibly contributing to dizziness. Lab results will be discussed with the cancer center during the next Lutathera  session. - Continue Lutathera  treatment - Discuss chromogranin A and hemoglobin levels with cancer center - Review medications and blood work during next Lutathera  session  Intermittent Dizziness Episodes possibly related to orthostatic hypotension. Current blood pressure is 114/82 mmHg after discontinuing amlodipine  10 mg. Dizziness occurs 2-3 times weekly, often after prolonged sitting followed by standing. No associated bleeding or significant changes in stool or urine. Amlodipine  was discontinued to assess its impact on blood pressure and dizziness. - Monitor blood pressure at home - Change positions slowly - Maintain hydration - Follow up with cancer center regarding hemoglobin levels  Hypertension Previously managed with amlodipine  10 mg, now discontinued. Current blood pressure is 114/82 mmHg. She is on toprol  100 mg daily and valsartan  320 mg daily. No significant dizziness related to blood pressure changes since stopping amlodipine . Plan to monitor blood pressure and consider reducing valsartan  dosage if stable. Amlodipine  was discontinued to evaluate its necessity given current readings. - Monitor blood pressure at home - Consider reducing valsartan  dosage if blood pressure remains stable - Discuss blood pressure management with cancer center      Return in about 6 months  (around 02/25/2024) for recheck/follow-up.    Subjective:    Chief Complaint  Patient presents with   Medical Management of Chronic Issues    Pt in office for follow up for orthostatic hypotension; pt admits a lot less dizzy spells but still having some from time to time, pt wanting to start journaling them to keep track;     HPI Discussed the use of AI scribe software for clinical note transcription with the patient, who gave verbal consent to proceed.  History of Present Illness SHANDIIN [name] is a 63 year old female with malignant neuroendocrine tumor who presents with intermittent dizziness.  She has been experiencing intermittent dizziness over the past week, occurring two to three times. The dizziness typically occurs when she stands up after sitting for a long time, such as when reading. She describes these episodes as less concerning than if she occurred while walking. No associated bleeding, stool changes, or urinary changes.  She has a history of malignant neuroendocrine tumor and is currently undergoing treatment with Lutathera , with her next session scheduled for next week. Her chromogranin A levels, which are used to monitor her cancer, have increased significantly from 86 to 188 over the past two months.  Her hemoglobin levels have been gradually decreasing over the past two months, from 11.8 to 11.2. No bleeding, stool changes, or urinary changes.  She recently stopped taking amlodipine  10 mg, yet her blood pressure remains low at 114/80. She is currently taking Toprol  100 mg daily and Valsartan  320 mg daily.     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   Constipation    Diabetes (HCC)    Dyspnea    with exertion   Fatty liver    Gallbladder problem    GE reflux    Hepatic encephalopathy (HCC) 2011    History of PCR DNA positive for HSV1    History of retinal vein occlusion, right eye    HTN (hypertension) 2011   Joint pain    Liver tumor    Neuroendocrine tumor 2011   Orthostatic hypotension    Osteoarthritis    Osteopenia    Palpitations    Sarcoidosis 2008   Sleep apnea    uses cpap   Surgical menopause     Past Surgical History:  Procedure Laterality Date   ABDOMINAL HYSTERECTOMY  2004   AUGMENTATION MAMMAPLASTY     BREAST BIOPSY Left    BREAST RECONSTRUCTION Bilateral 05/07/2020   Procedure: Revision of bilateral breast reconstruction;  Surgeon: Elisabeth Craig RAMAN, MD;  Location: [facility];  Service: Plastics;  Laterality: Bilateral;  2 hours   CESAREAN SECTION  [id number]   CHOLECYSTECTOMY  2006   HERNIA REPAIR     KNEE ARTHROSCOPY Left 09/2010   LAPAROSCOPIC HEPATECTOMY     LIPOSUCTION Bilateral 05/07/2020   Procedure: LIPOSUCTION with excision of lateral breast and axillary tissue;  Surgeon: Elisabeth Craig RAMAN, MD;  Location: [facility];  Service: Plastics;  Laterality: Bilateral;   LIVER SURGERY     MASTECTOMY Bilateral 2008   NASAL SINUS SURGERY     OOPHORECTOMY  2004   RECONSTRUCTION BREAST W/ LATISSIMUS DORSI FLAP Bilateral    TONSILLECTOMY  1976   TOTAL KNEE ARTHROPLASTY Left 10/29/2020   Procedure: TOTAL KNEE ARTHROPLASTY;  Surgeon: Melodi Lerner, MD;  Location: [facility];  Service: Orthopedics;  Laterality: Left;   TOTAL KNEE ARTHROPLASTY Right 04/14/2022   Procedure: TOTAL KNEE ARTHROPLASTY;  Surgeon: Melodi Lerner, MD;  Location: [facility];  Service: Orthopedics;  Laterality: Right;   TUBAL LIGATION Bilateral 2001    Family History  Problem Relation Age of Onset   Breast cancer Mother 23   Lupus Mother    Heart failure Father    Hypertension Father    Hyperlipidemia Father    Sudden death Father    Obesity Father    Breast cancer Maternal Aunt 6   Ovarian cancer Maternal Aunt 60   Breast cancer Maternal Aunt 40    Arthritis Maternal Grandmother    Arthritis Maternal Grandfather    Arthritis Paternal Grandmother    Arthritis Paternal Grandfather    Heart failure Paternal Grandfather    Colon polyps Neg Hx    Colon cancer Neg Hx    Esophageal cancer Neg Hx    Stomach cancer Neg Hx    Rectal cancer Neg Hx     Social History   Tobacco Use   Smoking status: Never   Smokeless tobacco: Never   Tobacco comments:    never used tobacco  Vaping Use   Vaping status: Never Used  Substance Use Topics   Alcohol use: No    Alcohol/week: 0.0 standard drinks of alcohol   Drug use: No     No Known Allergies  Review of Systems NEGATIVE UNLESS OTHERWISE INDICATED IN HPI      Objective:     BP 114/82 (BP Location: Left Arm, Patient Position: Sitting, Cuff Size: Large)   Pulse 94   Temp 98.1 F (36.7 C) (Temporal)   Ht  4' 11.5 (1.511 m)   Wt 173 lb 12.8 oz (78.8 kg)   SpO2 99%   BMI 34.52 kg/m   Wt Readings from Last 3 Encounters:  08/25/23 173 lb 12.8 oz (78.8 kg)  08/19/23 174 lb 1.9 oz (79 kg)  08/11/23 174 lb 3.2 oz (79 kg)    BP Readings from Last 3 Encounters:  08/25/23 114/82  08/19/23 135/79  07/28/23 120/60     Physical Exam Vitals and nursing note reviewed.  Constitutional:      Appearance: Normal appearance. She is normal weight. She is not toxic-appearing.  HENT:     Head: Normocephalic and atraumatic.  Eyes:     Extraocular Movements: Extraocular movements intact.     Conjunctiva/sclera: Conjunctivae normal.     Pupils: Pupils are equal, round, and reactive to light.  Cardiovascular:     Rate and Rhythm: Normal rate and regular rhythm.     Pulses: Normal pulses.     Heart sounds: Normal heart sounds.  Pulmonary:     Effort: Pulmonary effort is normal.     Breath sounds: Normal breath sounds.  Musculoskeletal:        General: Normal range of motion.     Cervical back: Normal range of motion and neck supple.  Skin:    General: Skin is warm and dry.   Neurological:     General: No focal deficit present.     Mental Status: She is alert and oriented to person, place, and time.     Cranial Nerves: No cranial nerve deficit.     Sensory: No sensory deficit.     Motor: No weakness.     Gait: Gait normal.  Psychiatric:        Mood and Affect: Mood normal.        Behavior: Behavior normal.             Garielle Mroz M Jahad Old, PA-C

## 2023-08-31 NOTE — Written Directive (Addendum)
 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 Timmy    TREATMENT #: 4    DATE OF LAST LONG LIVED SOMATOSTATIN INJECTION: Sandostatin  07/07/2023    ADDITIONAL PHYSICIAN COMMENTS/NOTES:   AUTHORIZED USER SIGNATURE & TIME STAMP: Norleen GORMAN Boxer, MD   09/01/23    8:49 AM

## 2023-09-01 ENCOUNTER — Ambulatory Visit (HOSPITAL_COMMUNITY)
Admission: RE | Admit: 2023-09-01 | Discharge: 2023-09-01 | Disposition: A | Discharge status: Home / Self Care | Payer: BC Managed Care – PPO | Source: Ambulatory Visit | Attending: Hematology & Oncology | Admitting: Hematology & Oncology

## 2023-09-01 VITALS — BP 154/76 | HR 76 | Resp 16

## 2023-09-01 DIAGNOSIS — C7B8 Other secondary neuroendocrine tumors: Secondary | ICD-10-CM | POA: Insufficient documentation

## 2023-09-01 DIAGNOSIS — C7A8 Other malignant neuroendocrine tumors: Secondary | ICD-10-CM | POA: Diagnosis not present

## 2023-09-01 DIAGNOSIS — D3A8 Other benign neuroendocrine tumors: Secondary | ICD-10-CM | POA: Insufficient documentation

## 2023-09-01 DIAGNOSIS — C220 Liver cell carcinoma: Secondary | ICD-10-CM | POA: Diagnosis not present

## 2023-09-01 MED ORDER — OCTREOTIDE ACETATE 30 MG IM KIT
30.0000 mg | PACK | Freq: Once | INTRAMUSCULAR | Status: AC
  Administered 2023-09-01: 30 mg via INTRAMUSCULAR

## 2023-09-01 MED ORDER — ONDANSETRON 8 MG/NS 50 ML IVPB
8.0000 mg | Freq: Once | INTRAVENOUS | Status: AC
  Administered 2023-09-01: 8 mg via INTRAVENOUS
  Filled 2023-09-01: qty 8

## 2023-09-01 MED ORDER — OCTREOTIDE ACETATE 30 MG IM KIT
PACK | INTRAMUSCULAR | Status: AC
Start: 2023-09-01 — End: 2023-09-01
  Filled 2023-09-01: qty 1

## 2023-09-01 MED ORDER — SODIUM CHLORIDE 0.9 % IV SOLN: 500.0000 mL | Freq: Once | INTRAVENOUS | Status: DC

## 2023-09-01 MED ORDER — OCTREOTIDE ACETATE 500 MCG/ML IJ SOLN
500.0000 ug | Freq: Once | INTRAMUSCULAR | Status: DC | PRN
Start: 2023-09-01 — End: 2023-09-07

## 2023-09-01 MED ORDER — AMINO ACID RADIOPROTECTANT - L-LYSINE 2.5%/L-ARGININE 2.5% IN NS
250.0000 mL/h | INTRAVENOUS | Status: AC
  Administered 2023-09-01: 250 mL/h via INTRAVENOUS
  Filled 2023-09-01: qty 1000

## 2023-09-01 MED ORDER — PROCHLORPERAZINE EDISYLATE 10 MG/2ML IJ SOLN
10.0000 mg | Freq: Four times a day (QID) | INTRAMUSCULAR | Status: DC | PRN
Start: 2023-09-01 — End: 2023-09-07

## 2023-09-01 MED ORDER — ONDANSETRON HCL 8 MG PO TABS: 8.0000 mg | ORAL_TABLET | Freq: Two times a day (BID) | ORAL | 0 refills | Status: AC | PRN

## 2023-09-01 MED ORDER — LUTETIUM LU 177 DOTATATE 370 MBQ/ML IV SOLN: 200.0000 | Freq: Once | INTRAVENOUS | Status: DC

## 2023-09-01 NOTE — Progress Notes (Signed)
 CLINICAL DATA: [age]] year-old [female] with metastatic neuroendocrine tumor. Well differentiated tumor with somatostatin receptor is identified within the [liver] by DOTATATE PET CT scan.  EXAM: NUCLEAR MEDICINE LUTATHERA  ADMINISTRATION  TECHNIQUE: Infusion: The nuclear medicine technologist and I personally verified the dose activity ([205] mCi) to be delivered as specified in the written directive (200 mCi), and verified the patient identification via 2 separate methods.  20 gauge IV were started in the antecubital veins. Anti-emetics were administered by nursing staff. Amino acid renal protection was initiated 30 minutes prior to Lu 177 DOTATATE (Lutathera ) infusion and continued continuously for 4 hours. Lutathera  infusion was administered over 30 minutes.      The total administered dose was [205] mCi Lu 177 DOTATATE.    The entire IV tubing, venocatheter, stopcock and syringes was removed in total, placed in a disposal bag and sent for assay of the residual activity, which will be reported at a later time in our EMR by the physics staff. Pressure was applied to the venipuncture sites, and a compression bandage placed. Radiation Safety personnel were present to perform the discharge survey, as detailed on their documentation.    Patient received 30 mg IM long-acting Sandostatin  injection 4 hours after Lutathera  effusion in the nuclear medicine department.   RADIOPHARMACEUTICALS:   [204.9] mCi Lu 177 DOTATATE   FINDINGS: Diagnosis: [Metastatic neuroendocrine tumor.]    Current Infusion: [4]    Planned Infusions: [4]    Patient reports [minimal] interval symptoms following therapy.  Persistent diarrhea.    The patient's most recent blood counts were reviewed and remains a good candidate to proceed with Lutathera .     No myelosuppression.  Normal renal function.  Chromogranin A elevated in the interval.     The patient was situated in an infusion suite and  administered Lutathera  as above. Patient will follow-up with referring oncologist for interval serum laboratories (CBC and CMP) in approximately 4 weeks.       Patient received 30 mg IM long-acting Sandostatin  injection 4 hours after Lutathera  effusion in the nuclear medicine department.     IMPRESSION: [Fourth and final] Lu 177 DOTATATE treatment for metastatic neuroendocrine tumor. The patient tolerated the infusion well. The patient will return in 6 to 8 weeks DOTATATE PET scan.

## 2023-09-01 NOTE — Progress Notes (Signed)
 Pt. Tolerated her fourth lutathera  treatment well.

## 2023-09-03 ENCOUNTER — Other Ambulatory Visit: Payer: Self-pay | Admitting: Physician Assistant

## 2023-09-03 NOTE — Telephone Encounter (Signed)
 Please advise on refills; per note on med list not taking per PCP but nothing in OV note from last visit indicating.

## 2023-09-04 MED ORDER — PHENYLEPHRINE HCL-NACL 20-0.9 MG/250ML-% IV SOLN
INTRAVENOUS | Status: DC
Start: 2023-09-04 — End: 2023-09-04
  Filled 2023-09-04: qty 250

## 2023-09-21 ENCOUNTER — Other Ambulatory Visit: Payer: Self-pay | Admitting: *Deleted

## 2023-09-21 MED ORDER — METHYLPREDNISOLONE 4 MG PO TBPK: ORAL_TABLET | ORAL | 0 refills | Status: DC

## 2023-09-22 ENCOUNTER — Other Ambulatory Visit: Payer: Self-pay | Admitting: Hematology & Oncology

## 2023-09-28 ENCOUNTER — Encounter: Payer: Self-pay | Admitting: Physician Assistant

## 2023-09-28 NOTE — Telephone Encounter (Signed)
Please see pt msg and advise if you would like her to schedule an appointment.

## 2023-09-29 ENCOUNTER — Ambulatory Visit (INDEPENDENT_AMBULATORY_CARE_PROVIDER_SITE_OTHER): Admitting: Family Medicine

## 2023-09-29 ENCOUNTER — Encounter: Payer: Self-pay | Admitting: Family Medicine

## 2023-09-29 VITALS — BP 152/82 | HR 78 | Temp 97.5°F | Resp 16 | Ht 59.5 in | Wt 173.2 lb

## 2023-09-29 DIAGNOSIS — Z7985 Long-term (current) use of injectable non-insulin antidiabetic drugs: Secondary | ICD-10-CM

## 2023-09-29 DIAGNOSIS — L299 Pruritus, unspecified: Secondary | ICD-10-CM | POA: Diagnosis not present

## 2023-09-29 DIAGNOSIS — C7B8 Other secondary neuroendocrine tumors: Secondary | ICD-10-CM

## 2023-09-29 DIAGNOSIS — E3400 Carcinoid syndrome, unspecified: Secondary | ICD-10-CM

## 2023-09-29 DIAGNOSIS — I10 Essential (primary) hypertension: Secondary | ICD-10-CM

## 2023-09-29 DIAGNOSIS — E119 Type 2 diabetes mellitus without complications: Secondary | ICD-10-CM

## 2023-09-29 LAB — COMPREHENSIVE METABOLIC PANEL WITH GFR
ALT: 19 U/L (ref 0–35)
AST: 19 U/L (ref 0–37)
Albumin: 3.9 g/dL (ref 3.5–5.2)
Alkaline Phosphatase: 71 U/L (ref 39–117)
BUN: 14 mg/dL (ref 6–23)
CO2: 27 meq/L (ref 19–32)
Calcium: 9 mg/dL (ref 8.4–10.5)
Chloride: 103 meq/L (ref 96–112)
Creatinine, Ser: 0.76 mg/dL (ref 0.40–1.20)
GFR: 82.73 mL/min (ref >60.00)
Glucose, Bld: 97 mg/dL (ref 70–99)
Potassium: 4.4 meq/L (ref 3.5–5.1)
Sodium: 138 meq/L (ref 135–145)
Total Bilirubin: 0.5 mg/dL (ref 0.2–1.2)
Total Protein: 7.1 g/dL (ref 6.0–8.3)

## 2023-09-29 LAB — CBC WITH DIFFERENTIAL/PLATELET
Basophils Absolute: 0 K/uL (ref 0.0–0.1)
Basophils Relative: 0.4 % (ref 0.0–3.0)
Eosinophils Absolute: 0.3 K/uL (ref 0.0–0.7)
Eosinophils Relative: 3.7 % (ref 0.0–5.0)
HCT: 36.2 % (ref 36.0–46.0)
Hemoglobin: 12.2 g/dL (ref 12.0–15.0)
Lymphocytes Relative: 10 % — ABNORMAL LOW (ref 12.0–46.0)
Lymphs Abs: 0.7 K/uL (ref 0.7–4.0)
MCHC: 33.6 g/dL (ref 30.0–36.0)
MCV: 94.7 fl (ref 78.0–100.0)
Monocytes Absolute: 0.6 K/uL (ref 0.1–1.0)
Monocytes Relative: 7.8 % (ref 3.0–12.0)
Neutro Abs: 5.7 K/uL (ref 1.4–7.7)
Neutrophils Relative %: 78.1 % — ABNORMAL HIGH (ref 43.0–77.0)
Platelets: 179 K/uL (ref 150.0–400.0)
RBC: 3.83 Mil/uL — ABNORMAL LOW (ref 3.87–5.11)
RDW: 14.1 % (ref 11.5–15.5)
WBC: 7.4 K/uL (ref 4.0–10.5)

## 2023-09-29 LAB — TSH: TSH: 1.99 u[IU]/mL (ref 0.35–5.50)

## 2023-09-29 MED ORDER — HYDROXYZINE HCL 10 MG PO TABS: 10.0000 mg | ORAL_TABLET | Freq: Three times a day (TID) | ORAL | 1 refills | Status: AC | PRN

## 2023-09-29 NOTE — Progress Notes (Signed)
 Subjective:     Patient ID: Leah [name], female    DOB: 03-17-1960, 63 y.o.   MRN: [id number]  Chief Complaint  Patient presents with   Itching    Itching all over since last weekend, was on prednisone , helped, but itching came back after finishing medication     HPI Discussed the use of AI scribe software for clinical note transcription with the patient, who gave verbal consent to proceed.  History of Present Illness Leah [name] is a 63 year old female with carcinoid cancer who presents with severe itching.  She experiences severe itching from head to toe without any visible rash, which began approximately ten days ago. She has tried using cortisone itch cream and Benadryl  without relief. A Medrol  dose pack provided temporary relief, but symptoms returned after completion.  She has a history of carcinoid cancer and receives octreotide  treatment, which can cause itching. However, this episode of itching is more severe and persistent than previous experiences. She recently completed a course of radioactive treatment three weeks ago, which was her last session, and she has not experienced similar symptoms with prior treatments.  She stopped taking amlodipine  some time ago, which helped with dizziness, but has not started any new medications or changed her current regimen. She denies any new foods, environments, or other changes that could account for the itching. Takes singulair  and allegra  daily.   She experiences stress due to caring for her mother-in-law and adopting her grandson, which could exacerbate her symptoms. She also mentions a history of cold sores and has taken valacyclovir  for this.  No chest pain, shortness of breath, vomiting, or diarrhea. She has not experienced any changes in her carcinoid symptoms. She has not had any recent changes in her medication dosages or new side effects from her treatments.  She has not been monitoring her blood pressure regularly since  stopping amlodipine , but notes it was stable when last checked. She has a history of dry skin and takes the lowest dose of Mounjaro  for diabetes, which she tolerates well despite occasional nausea.    Health Maintenance Due  Topic Date Due   OPHTHALMOLOGY EXAM  09/28/2022   INFLUENZA VACCINE  09/11/2023    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   Cataract    Constipation    Diabetes (HCC)    Dyspnea    with exertion   Fatty liver    Gallbladder problem    GE reflux    Hepatic encephalopathy (HCC) 2011   History of PCR DNA positive for HSV1    History of retinal vein occlusion, right eye    HTN (hypertension) 2011   Joint pain    Liver tumor    Neuroendocrine tumor 2011   Orthostatic hypotension    Osteoarthritis    Osteopenia    Palpitations    Sarcoidosis 2008   Sleep apnea    uses cpap   Surgical menopause     Past Surgical History:  Procedure Laterality Date   ABDOMINAL HYSTERECTOMY  2004   AUGMENTATION MAMMAPLASTY     BREAST BIOPSY Left    BREAST RECONSTRUCTION Bilateral 05/07/2020   Procedure: Revision of bilateral breast reconstruction;  Surgeon: Elisabeth Craig RAMAN, MD;  Location: [facility];  Service:  Plastics;  Laterality: Bilateral;  2 hours   CESAREAN SECTION  1999,2001   CHOLECYSTECTOMY  2006   COSMETIC SURGERY     Breast reconstruction   EYE SURGERY     Lasik   HERNIA REPAIR     JOINT REPLACEMENT     Left knee replacement   KNEE ARTHROSCOPY Left 09/2010   LAPAROSCOPIC HEPATECTOMY     LIPOSUCTION Bilateral 05/07/2020   Procedure: LIPOSUCTION with excision of lateral breast and axillary tissue;  Surgeon: Elisabeth Craig RAMAN, MD;  Location: [facility];  Service: Plastics;  Laterality: Bilateral;   LIVER SURGERY     MASTECTOMY Bilateral 2008   NASAL SINUS SURGERY      OOPHORECTOMY  2004   RECONSTRUCTION BREAST W/ LATISSIMUS DORSI FLAP Bilateral    TONSILLECTOMY  1976   TOTAL KNEE ARTHROPLASTY Left 10/29/2020   Procedure: TOTAL KNEE ARTHROPLASTY;  Surgeon: Melodi Lerner, MD;  Location: [facility];  Service: Orthopedics;  Laterality: Left;   TOTAL KNEE ARTHROPLASTY Right 04/14/2022   Procedure: TOTAL KNEE ARTHROPLASTY;  Surgeon: Melodi Lerner, MD;  Location: [facility];  Service: Orthopedics;  Laterality: Right;   TUBAL LIGATION Bilateral 2001     Current Outpatient Medications:    albuterol  (VENTOLIN  HFA) 108 (90 Base) MCG/ACT inhaler, Inhale 2 puffs into the lungs every 6 (six) hours as needed for wheezing or shortness of breath., Disp: 8 g, Rfl: 0   amLODipine  (NORVASC ) 10 MG tablet, TAKE 1 TABLET BY MOUTH EVERY DAY, Disp: 90 tablet, Rfl: 3   clonazePAM  (KLONOPIN ) 0.5 MG tablet, TAKE 1 TABLET BY MOUTH TWICE A DAY, Disp: 60 tablet, Rfl: 0   Docusate Sodium  (DSS) 100 MG CAPS, Take 100 mg by mouth., Disp: , Rfl:    fexofenadine  (ALLEGRA ) 180 MG tablet, TAKE 1 TABLET BY MOUTH EVERY DAY, Disp: 90 tablet, Rfl: 3   hydrOXYzine  (ATARAX ) 10 MG tablet, Take 1 tablet (10 mg total) by mouth 3 (three) times daily as needed., Disp: 30 tablet, Rfl: 1   ibuprofen  (ADVIL ) 800 MG tablet, Take 1 tablet (800 mg total) by mouth every 8 (eight) hours as needed for moderate pain (pain score 4-6) or cramping. Take with food., Disp: 30 tablet, Rfl: 1   lutetium Lu 177 dotatate  (LUTATHERA ) 370 MBQ/ML SOLN, Inject 200 millicuries into the vein. Every 2 months., Disp: , Rfl:    metFORMIN  (GLUCOPHAGE ) 500 MG tablet, Take 1 tablet (500 mg total) by mouth 2 (two) times daily with a meal., Disp: 180 tablet, Rfl: 3   methocarbamol  (ROBAXIN ) 500 MG tablet, Take 1 tablet (500 mg total) by mouth every 6 (six) hours as needed for muscle spasms., Disp: 40 tablet, Rfl: 0   metoprolol  succinate (TOPROL -XL) 100 MG 24 hr tablet, TAKE 1 TABLET BY MOUTH EVERY DAY EVERY EVENING WITH OR IMMEDIATELY  FOLLOWING A MEAL, Disp: 90 tablet, Rfl: 1   montelukast  (SINGULAIR ) 10 MG tablet, TAKE 1 TABLET BY MOUTH EVERYDAY AT BEDTIME, Disp: 90 tablet, Rfl: 2   MOUNJARO  2.5 MG/0.5ML Pen, Inject 2.5 mg into the skin once a week., Disp: , Rfl:    octreotide  (SANDOSTATIN  LAR) 30 MG injection, Inject 30 mg into the muscle every 28 (twenty-eight) days., Disp: , Rfl:    ondansetron  (ZOFRAN ) 8 MG tablet, Take 1 tablet (8 mg total) by mouth 2 (two) times daily as needed for nausea or vomiting., Disp: 20 tablet, Rfl: 0   ondansetron  (ZOFRAN -ODT) 4 MG disintegrating tablet, TAKE 1 TABLET BY MOUTH EVERY 8 HOURS AS NEEDED *  INS MAX 18 TABS PER 21 DAYS*, Disp: 18 tablet, Rfl: 3   pantoprazole  (PROTONIX ) 40 MG tablet, TAKE 1 TABLET BY MOUTH TWICE A DAY, Disp: 180 tablet, Rfl: 0   rosuvastatin  (CRESTOR ) 10 MG tablet, TAKE 1 TABLET BY MOUTH EVERY DAY, Disp: 90 tablet, Rfl: 0   scopolamine  (TRANSDERM-SCOP) 1 MG/3DAYS, Place 1 patch (1.5 mg total) onto the skin every 3 (three) days., Disp: 10 patch, Rfl: 12   valACYclovir  (VALTREX ) 1000 MG tablet, Take 1,000 mg by mouth daily., Disp: , Rfl:    valsartan  (DIOVAN ) 320 MG tablet, TAKE 1 TABLET BY MOUTH EVERY DAY, Disp: 90 tablet, Rfl: 0   venlafaxine  XR (EFFEXOR -XR) 150 MG 24 hr capsule, TAKE 1 CAPSULE BY MOUTH EVERY DAY IN THE MORNING, Disp: 90 capsule, Rfl: 1   methylPREDNISolone  (MEDROL  DOSEPAK) 4 MG TBPK tablet, Take as directed on package (Patient not taking: Reported on 09/29/2023), Disp: 1 each, Rfl: 0  No Known Allergies ROS neg/noncontributory except as noted HPI/below      Objective:     BP (!) 152/82   Pulse 78   Temp (!) 97.5 F (36.4 C) (Temporal)   Resp 16   Ht 4' 11.5 (1.511 m)   Wt 173 lb 4 oz (78.6 kg)   SpO2 98%   BMI 34.41 kg/m  Wt Readings from Last 3 Encounters:  09/29/23 173 lb 4 oz (78.6 kg)  08/25/23 173 lb 12.8 oz (78.8 kg)  08/19/23 174 lb 1.9 oz (79 kg)    Physical Exam   Gen: WDWN NAD HEENT: NCAT, conjunctiva not injected,  sclera nonicteric Op dry. No swelling CARDIAC: RRR, S1S2+, no murmur.  LUNGS: CTAB. No wheezes ABDOMEN:  BS+, soft, NTND, No HSM, no masses EXT:  no edema MSK: no gross abnormalities.  NEURO: A&O x3.  CN II-XII intact.  PSYCH: normal mood. Good eye contact  Skin-no rash.  A little pale.  No jaundice  Reviewed labs from July and notes     Assessment & Plan:  Pruritus -     Comprehensive metabolic panel with GFR -     Hepatitis C antibody -     TSH -     CBC with Differential/Platelet  Other orders -     hydrOXYzine  HCl; Take 1 tablet (10 mg total) by mouth 3 (three) times daily as needed.  Dispense: 30 tablet; Refill: 1  Assessment and Plan Assessment & Plan Generalized pruritus   Generalized pruritus began with severe itching persisting despite antihistamines and corticosteroids. Differential diagnosis includes medication side effects, stress-related exacerbation, or potential liver dysfunction. No new medications or changes in current medications. Previous Medrol  pack provided temporary relief. Stress and hormonal factors related to the carcinoid tumor are considered. Order basic blood work to evaluate liver function and other potential causes. Initiate gabapentin  twice daily, with the option to increase to three times daily if needed-has at home. Prescribe Atarax  for itching, with caution regarding potential drowsiness. Continue current antihistamine regimen with Allegra . Follow up with the oncologist to discuss potential tumor or medication-related causes.  Carcinoid tumor under active management   The carcinoid tumor is under active management with octreotide . Recent radioactive treatment completed three weeks ago without new side effects. Itching may be related to hormonal changes or treatment side effects. Continue current carcinoid tumor management regimen.  Hypertension   Hypertension with recent discontinuation of amlodipine . Current blood pressure reading elevated at  150/90, potentially due to stress or pruritus. Previous home monitoring showed stable readings. Monitor  blood pressure at home regularly. Consider re-evaluation of the antihypertensive regimen if blood pressure remains elevated.  Type 2 diabetes mellitus   Type 2 diabetes mellitus is managed with Mounjaro  at the lowest dose due to nausea. Recent A1c levels are within the normal range.     Return if symptoms worsen or fail to improve.  Leah CHRISTELLA Carrel, MD

## 2023-09-29 NOTE — Patient Instructions (Addendum)
 It was very nice to see you today!  Gabapentin  2x/day but can do 3x/day.   Hydroxyzine  3x/day as needed.   Moniotr bp's   PLEASE NOTE:  If you had any lab tests please let us  know if you have not heard back within a few days. You may see your results on MyChart before we have a chance to review them but we will give you a call once they are reviewed by us . If we ordered any referrals today, please let us  know if you have not heard from their office within the next week.   Please try these tips to maintain a healthy lifestyle:  Eat most of your calories during the day when you are active. Eliminate processed foods including packaged sweets (pies, cakes, cookies), reduce intake of potatoes, white bread, white pasta, and white rice. Look for whole grain options, oat flour or almond flour.  Each meal should contain half fruits/vegetables, one quarter protein, and one quarter carbs (no bigger than a computer mouse).  Cut down on sweet beverages. This includes juice, soda, and sweet tea. Also watch fruit intake, though this is a healthier sweet option, it still contains natural sugar! Limit to 3 servings daily.  Drink at least 1 glass of water  with each meal and aim for at least 8 glasses per day  Exercise at least 150 minutes every week.

## 2023-09-30 ENCOUNTER — Ambulatory Visit: Payer: Self-pay | Admitting: Family Medicine

## 2023-09-30 LAB — HEPATITIS C ANTIBODY: Hepatitis C Ab: NONREACTIVE

## 2023-09-30 NOTE — Progress Notes (Signed)
 Labs stable/normal.  Not sure what to make of the itching.  Have her f/u onc. Hopefully the gabapentin  helps-let us  know.

## 2023-10-01 ENCOUNTER — Inpatient Hospital Stay: Attending: Hematology & Oncology

## 2023-10-01 VITALS — BP 146/94 | HR 96 | Temp 98.3°F | Resp 17

## 2023-10-01 DIAGNOSIS — C7A00 Malignant carcinoid tumor of unspecified site: Secondary | ICD-10-CM | POA: Diagnosis not present

## 2023-10-01 DIAGNOSIS — C7B02 Secondary carcinoid tumors of liver: Secondary | ICD-10-CM | POA: Insufficient documentation

## 2023-10-01 DIAGNOSIS — C7B8 Other secondary neuroendocrine tumors: Secondary | ICD-10-CM

## 2023-10-01 MED ORDER — LANREOTIDE ACETATE 120 MG/0.5ML ~~LOC~~ SOLN
120.0000 mg | Freq: Once | SUBCUTANEOUS | Status: AC
  Administered 2023-10-01: 120 mg via SUBCUTANEOUS
  Filled 2023-10-01: qty 120

## 2023-10-01 NOTE — Patient Instructions (Signed)
 Lanreotide Injection What is this medication? LANREOTIDE (lan REE oh tide) treats high levels of growth hormone (acromegaly). It is used when other therapies have not worked well enough or cannot be tolerated. It works by reducing the amount of growth hormone your body makes. This reduces symptoms and the risk of health problems caused by too much growth hormone, such as diabetes and heart disease. It may also be used to treat neuroendocrine tumors, a cancer of the cells that release hormones and other substances in your body. It works by slowing down the release of these substances from the cells. This slows tumor growth. It also decreases the symptoms of carcinoid syndrome, such as flushing or diarrhea. This medicine may be used for other purposes; ask your health care provider or pharmacist if you have questions. COMMON BRAND NAME(S): Somatuline [place] What should I tell my care team before I take this medication? They need to know if you have any of these conditions: Diabetes Gallbladder disease Heart disease Kidney disease Liver disease Pancreatic disease Thyroid disease An unusual or allergic reaction to lanreotide, other medications, foods, dyes, or preservatives Pregnant or trying to get pregnant Breastfeeding How should I use this medication? This medication is injected under the skin. It is given by your care team in a hospital or clinic setting. Talk to your care team about the use of this medication in children. Special care may be needed. Overdosage: If you think you have taken too much of this medicine contact a poison control center or emergency room at once. NOTE: This medicine is only for you. Do not share this medicine with others. What if I miss a dose? Keep appointments for follow-up doses. It is important not to miss your dose. Call your care team if you are unable to keep an appointment. What may interact with this medication? Bromocriptine Cyclosporine Certain  medications for blood pressure, heart disease, irregular heartbeat Certain medications for diabetes Quinidine Terfenadine This list may not describe all possible interactions. Give your health care provider a list of all the medicines, herbs, non-prescription drugs, or dietary supplements you use. Also tell them if you smoke, drink alcohol, or use illegal drugs. Some items may interact with your medicine. What should I watch for while using this medication? Visit your care team for regular checks on your progress. Tell your care team if your symptoms do not start to get better or if they get worse. Your condition will be monitored carefully while you are receiving this medication. You may need blood work while you are taking this medication. This medication may increase blood sugar. The risk may be higher in patients who already have diabetes. Ask your care team what you can do to lower your risk of diabetes while taking this medication. Talk to your care team if you wish to become pregnant or think you may be pregnant. This medication can cause serious birth defects. Do not breast-feed while taking this medication and for 6 months after stopping therapy. This medication may cause infertility. Talk to your care team if you are concerned about your fertility. What side effects may I notice from receiving this medication? Side effects that you should report to your care team as soon as possible: Allergic reactions--skin rash, itching, hives, swelling of the face, lips, tongue, or throat Gallbladder problems--severe stomach pain, nausea, vomiting, fever High blood sugar (hyperglycemia)--increased thirst or amount of urine, unusual weakness or fatigue, blurry vision Increase in blood pressure Low blood sugar (hypoglycemia)--pale, blue or purple  skin or lips, sweating, fussiness, rapid heartbeat, poor feeding, low body temperature Low thyroid levels (hypothyroidism)--unusual weakness or fatigue,  increased sensitivity to cold, constipation, hair loss, dry skin, weight gain, feelings of depression Oily or light-colored stools, diarrhea, bloating, weight loss Slow heartbeat--dizziness, feeling faint or lightheaded, confusion, trouble breathing, unusual weakness or fatigue Side effects that usually do not require medical attention (report these to your care team if they continue or are bothersome): Diarrhea Dizziness Headache Muscle spasms Nausea Pain, redness, or irritation at injection site Stomach pain This list may not describe all possible side effects. Call your doctor for medical advice about side effects. You may report side effects to FDA at 1-800-FDA-1088. Where should I keep my medication? This medication is given in a hospital or clinic. It will not be stored at home. NOTE: This sheet is a summary. It may not cover all possible information. If you have questions about this medicine, talk to your doctor, pharmacist, or health care provider.  2024 Elsevier/Gold Standard (2023-01-09 00:00:00)

## 2023-10-06 ENCOUNTER — Encounter (HOSPITAL_COMMUNITY): Payer: BC Managed Care – PPO

## 2023-10-06 NOTE — Progress Notes (Unsigned)
 [name] [address] [place] [address] Phone: [phone]   Assessment and Plan:     1. 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  Elected for repeat trigger finger CSI today.  Tolerated well per note below.  CSI may temporarily increase blood glucose in patient with past medical history of DM type II  Procedure: Flexor Tendon Sheath Injection (Trigger Finger) Side: Right 1st digit  Indication: Trigger Finger  After explaining the procedure, viable alternatives, risks, and answering any questions, consent was given verbally.  The site was cleaned with alcohol prep.  A steroid injection was performed under ultrasound guidance using 0.34ml of 1% lidocaine  without epinephrine  and 20 mg of Kenalog  40 with sterile technique.  This was well tolerated and resulted in  relief.  Needle was removed and dressing placed and post injection instructions were given including  a discussion of likely return of pain today after the anesthetic wears off (with the possibility of worsened pain) until the steroid starts to work in 3-5 days.   Pt was advised to call or return to clinic if these symptoms worsen or fail to improve as anticipated.  If not at least 50% better in 6 weeks would consider repeat injection. Images permanently stored.    Pertinent previous records reviewed include none  Follow Up: As needed if no improvement or worsening of symptoms.  Could refer to orthopedic hand surgery at any time for trigger finger release if patient wishes   Subjective:   I, Chestine Reeves, am serving as a Neurosurgeon for Doctor Morene Mace   Chief Complaint: right knee injection, bilateral thumb pain    HPI:    06/26/21 Patient is a 62 year old  female complaining of right knee pain and bilateral thumb pain. Patient states that sometimes they wont bend at all its a click up and click down , been chronic hand pain but he clicking and locking has been going on for a couple of weeks, hx of right thumb fx , does get numbness and tingling, thumbs have been waking her up during the night , has been taking tylenol  and using Voltaren  gel and gabapentin ,    09/25/2021 Patient states that a few weeks ago her knee was awful but now it is feeling better , hands are good , wants injection    03/03/2022 Patient states thumb pain is back , right knee is hurting a lot want CSI today    07/16/2022 Patient states CSI thumbs , fell a couple of weeks ago    06/04/2023 Patient states left thumb read for CSI , right having mild symptoms.  Interested in repeat bilateral CSI.  10/07/2023 Patient states ready for CSI. Pain came back a week ago. R thumb    Relevant Historical Information: History of stage IV liver cancer s/p resection, total left knee replacement, DM type II  Additional pertinent review of systems negative.   Current Outpatient Medications:    albuterol  (VENTOLIN  HFA) 108 (90 Base) MCG/ACT inhaler, Inhale 2 puffs into the lungs every 6 (six) hours as needed for wheezing or shortness of breath., Disp: 8 g, Rfl: 0   amLODipine  (NORVASC ) 10 MG tablet, TAKE 1 TABLET BY MOUTH EVERY DAY, Disp: 90 tablet, Rfl: 3   clonazePAM  (KLONOPIN ) 0.5 MG  tablet, TAKE 1 TABLET BY MOUTH TWICE A DAY, Disp: 60 tablet, Rfl: 0   Docusate Sodium  (DSS) 100 MG CAPS, Take 100 mg by mouth., Disp: , Rfl:    fexofenadine  (ALLEGRA ) 180 MG tablet, TAKE 1 TABLET BY MOUTH EVERY DAY, Disp: 90 tablet, Rfl: 3   hydrOXYzine  (ATARAX ) 10 MG tablet, Take 1 tablet (10 mg total) by mouth 3 (three) times daily as needed., Disp: 30 tablet, Rfl: 1   ibuprofen  (ADVIL ) 800 MG tablet, Take 1 tablet (800 mg total) by mouth every 8 (eight) hours as needed for moderate pain (pain score 4-6) or  cramping. Take with food., Disp: 30 tablet, Rfl: 1   lutetium Lu 177 dotatate  (LUTATHERA ) 370 MBQ/ML SOLN, Inject 200 millicuries into the vein. Every 2 months., Disp: , Rfl:    metFORMIN  (GLUCOPHAGE ) 500 MG tablet, Take 1 tablet (500 mg total) by mouth 2 (two) times daily with a meal., Disp: 180 tablet, Rfl: 3   methocarbamol  (ROBAXIN ) 500 MG tablet, Take 1 tablet (500 mg total) by mouth every 6 (six) hours as needed for muscle spasms., Disp: 40 tablet, Rfl: 0   methylPREDNISolone  (MEDROL  DOSEPAK) 4 MG TBPK tablet, Take as directed on package (Patient not taking: Reported on 09/29/2023), Disp: 1 each, Rfl: 0   metoprolol  succinate (TOPROL -XL) 100 MG 24 hr tablet, TAKE 1 TABLET BY MOUTH EVERY DAY EVERY EVENING WITH OR IMMEDIATELY FOLLOWING A MEAL, Disp: 90 tablet, Rfl: 1   montelukast  (SINGULAIR ) 10 MG tablet, TAKE 1 TABLET BY MOUTH EVERYDAY AT BEDTIME, Disp: 90 tablet, Rfl: 2   MOUNJARO  2.5 MG/0.5ML Pen, Inject 2.5 mg into the skin once a week., Disp: , Rfl:    octreotide  (SANDOSTATIN  LAR) 30 MG injection, Inject 30 mg into the muscle every 28 (twenty-eight) days., Disp: , Rfl:    ondansetron  (ZOFRAN ) 8 MG tablet, Take 1 tablet (8 mg total) by mouth 2 (two) times daily as needed for nausea or vomiting., Disp: 20 tablet, Rfl: 0   ondansetron  (ZOFRAN -ODT) 4 MG disintegrating tablet, TAKE 1 TABLET BY MOUTH EVERY 8 HOURS AS NEEDED *INS MAX 18 TABS PER 21 DAYS*, Disp: 18 tablet, Rfl: 3   pantoprazole  (PROTONIX ) 40 MG tablet, TAKE 1 TABLET BY MOUTH TWICE A DAY, Disp: 180 tablet, Rfl: 0   rosuvastatin  (CRESTOR ) 10 MG tablet, TAKE 1 TABLET BY MOUTH EVERY DAY, Disp: 90 tablet, Rfl: 0   scopolamine  (TRANSDERM-SCOP) 1 MG/3DAYS, Place 1 patch (1.5 mg total) onto the skin every 3 (three) days., Disp: 10 patch, Rfl: 12   valACYclovir  (VALTREX ) 1000 MG tablet, Take 1,000 mg by mouth daily., Disp: , Rfl:    valsartan  (DIOVAN ) 320 MG tablet, TAKE 1 TABLET BY MOUTH EVERY DAY, Disp: 90 tablet, Rfl: 0   venlafaxine  XR  (EFFEXOR -XR) 150 MG 24 hr capsule, TAKE 1 CAPSULE BY MOUTH EVERY DAY IN THE MORNING, Disp: 90 capsule, Rfl: 1   Objective:     Vitals:   10/07/23 1123  Weight: 175 lb (79.4 kg)  Height: 4' 11 (1.499 m)      Body mass index is 35.35 kg/m.    Physical Exam:    Right hand/wrist:  No deformity or swelling appreciated. TTP first MCP with palpable nodule   nttp over the snauff box, dorsal carpals, volar carpals, radial styloid, ulnar styloid,  tfcc Partial triggering of   thumb  with flexion and extension Negative Tinel's Negative finklestein Neg tfcc bounce test    Electronically signed by:  Odis Mace [name] CHESHIRE [facility] 11:28 AM 10/07/23

## 2023-10-07 ENCOUNTER — Ambulatory Visit: Admitting: Sports Medicine

## 2023-10-07 VITALS — HR 86 | Ht 59.0 in | Wt 175.0 lb

## 2023-10-07 DIAGNOSIS — M65311 Trigger thumb, right thumb: Secondary | ICD-10-CM | POA: Diagnosis not present

## 2023-10-11 ENCOUNTER — Other Ambulatory Visit: Payer: Self-pay | Admitting: Physician Assistant

## 2023-10-14 ENCOUNTER — Ambulatory Visit: Admitting: Hematology & Oncology

## 2023-10-14 ENCOUNTER — Inpatient Hospital Stay

## 2023-10-14 ENCOUNTER — Ambulatory Visit

## 2023-10-16 ENCOUNTER — Other Ambulatory Visit: Payer: Self-pay | Admitting: Physician Assistant

## 2023-10-16 DIAGNOSIS — R110 Nausea: Secondary | ICD-10-CM

## 2023-10-27 ENCOUNTER — Other Ambulatory Visit: Payer: Self-pay | Admitting: Family Medicine

## 2023-10-27 ENCOUNTER — Encounter: Payer: Self-pay | Admitting: Family Medicine

## 2023-10-27 ENCOUNTER — Ambulatory Visit (INDEPENDENT_AMBULATORY_CARE_PROVIDER_SITE_OTHER): Admitting: Family Medicine

## 2023-10-27 VITALS — BP 124/82 | HR 82 | Temp 97.8°F | Ht 59.0 in | Wt 174.0 lb

## 2023-10-27 DIAGNOSIS — L290 Pruritus ani: Secondary | ICD-10-CM

## 2023-10-27 DIAGNOSIS — R0989 Other specified symptoms and signs involving the circulatory and respiratory systems: Secondary | ICD-10-CM

## 2023-10-27 MED ORDER — ALBENDAZOLE 200 MG PO TABS: ORAL_TABLET | ORAL | 0 refills | Status: DC

## 2023-10-27 NOTE — Progress Notes (Signed)
 Acute Office Visit  Subjective:     Patient ID: Leah [name], female    DOB: 06-08-1960, 63 y.o.   MRN: [id number]  Chief Complaint  Patient presents with   Acute Visit    HPI  Exposed to pinworms from grandson and exposed to COVID from son.  Reports anal itching and noting small round objects on rectum in the mornings.  Has tried OTC treatment with temporary relief. Has only tried one treatment.   Has not taken home COVID test. Reports runny nose for the last couple of days, denies chills, fever, abdominal pain, nausea, vomiting, diarrhea, rash, other concerns. Would like to be tested today.    ROS Per HPI      Objective:    BP 124/82 (BP Location: Left Arm, Patient Position: Sitting)   Pulse 82   Temp 97.8 F (36.6 C) (Temporal)   Ht 4' 11 (1.499 m)   Wt 174 lb (78.9 kg)   SpO2 98%   BMI 35.14 kg/m    Physical Exam Vitals and nursing note reviewed.  Constitutional:      General: She is not in acute distress.    Appearance: She is obese.     Comments: Appears fatigued  HENT:     Head: Normocephalic and atraumatic.     Right Ear: External ear normal.     Left Ear: External ear normal.     Nose: Nose normal.     Mouth/Throat:     Mouth: Mucous membranes are moist.     Pharynx: Oropharynx is clear.  Eyes:     Extraocular Movements: Extraocular movements intact.     Pupils: Pupils are equal, round, and reactive to light.  Cardiovascular:     Rate and Rhythm: Normal rate and regular rhythm.     Pulses: Normal pulses.     Heart sounds: Normal heart sounds.  Pulmonary:     Effort: Pulmonary effort is normal. No respiratory distress.     Breath sounds: Normal breath sounds. No wheezing, rhonchi or rales.  Genitourinary:    Comments: Small, oval-shaped, white object noted at the rectum, consistent with pinworm eggs, area erythematous.  No discharge, no open lesions Musculoskeletal:        General: Normal range of motion.     Cervical back: Normal  range of motion.     Right lower leg: No edema.     Left lower leg: No edema.  Lymphadenopathy:     Cervical: No cervical adenopathy.  Neurological:     General: No focal deficit present.     Mental Status: She is alert and oriented to person, place, and time.  Psychiatric:        Mood and Affect: Mood normal.        Thought Content: Thought content normal.     No results found for any visits on 10/27/23.      Assessment & Plan:    Runny nose -     POC COVID-19 BinaxNow  Anal itching  Likely pinworms given exposure to grandson, prescribed albendazole  to take 400 mg once once you receive the medication and then repeat in 1 week  COVID test was negative  Orders Placed This Encounter  Procedures   POC COVID-19 BinaxNow     Meds ordered this encounter  Medications   DISCONTD: albendazole  (ALBENZA ) 200 MG tablet    Sig: Take 400mg  once today and 400mg  once again in 2 weeks.    Dispense:  2 tablet  Refill:  0    No follow-ups on file.  Corean LITTIE Ku, FNP

## 2023-10-27 NOTE — Patient Instructions (Signed)
 May use gentian violet once daily as needed for rashes and itching  I have sent in albendazole  for you to take 400mg  today and 400mg  again in 2 weeks.   COVID testing was negative today.   Follow-up with me for new or worsening symptoms.

## 2023-10-31 ENCOUNTER — Other Ambulatory Visit: Payer: Self-pay | Admitting: Physician Assistant

## 2023-10-31 DIAGNOSIS — G8929 Other chronic pain: Secondary | ICD-10-CM

## 2023-11-01 LAB — POC COVID19 BINAXNOW: SARS Coronavirus 2 Ag: NEGATIVE

## 2023-11-02 ENCOUNTER — Inpatient Hospital Stay

## 2023-11-02 ENCOUNTER — Other Ambulatory Visit: Payer: Self-pay

## 2023-11-02 ENCOUNTER — Inpatient Hospital Stay (HOSPITAL_BASED_OUTPATIENT_CLINIC_OR_DEPARTMENT_OTHER): Admitting: Hematology & Oncology

## 2023-11-02 ENCOUNTER — Encounter: Payer: Self-pay | Admitting: Physician Assistant

## 2023-11-02 ENCOUNTER — Inpatient Hospital Stay: Attending: Hematology & Oncology

## 2023-11-02 VITALS — BP 152/96 | HR 81 | Temp 99.0°F | Resp 18

## 2023-11-02 DIAGNOSIS — C7A00 Malignant carcinoid tumor of unspecified site: Secondary | ICD-10-CM | POA: Insufficient documentation

## 2023-11-02 DIAGNOSIS — C7B8 Other secondary neuroendocrine tumors: Secondary | ICD-10-CM | POA: Diagnosis not present

## 2023-11-02 DIAGNOSIS — Z79899 Other long term (current) drug therapy: Secondary | ICD-10-CM | POA: Diagnosis not present

## 2023-11-02 DIAGNOSIS — C7B02 Secondary carcinoid tumors of liver: Secondary | ICD-10-CM | POA: Diagnosis not present

## 2023-11-02 DIAGNOSIS — K769 Liver disease, unspecified: Secondary | ICD-10-CM | POA: Diagnosis not present

## 2023-11-02 DIAGNOSIS — E3400 Carcinoid syndrome, unspecified: Secondary | ICD-10-CM | POA: Diagnosis not present

## 2023-11-02 LAB — CMP (CANCER CENTER ONLY)
ALT: 17 U/L (ref 0–44)
AST: 21 U/L (ref 15–41)
Albumin: 4.2 g/dL (ref 3.5–5.0)
Alkaline Phosphatase: 77 U/L (ref 38–126)
Anion gap: 12 (ref 5–15)
BUN: 13 mg/dL (ref 8–23)
CO2: 25 mmol/L (ref 22–32)
Calcium: 9.2 mg/dL (ref 8.9–10.3)
Chloride: 102 mmol/L (ref 98–111)
Creatinine: 0.73 mg/dL (ref 0.44–1.00)
GFR, Estimated: >60 mL/min (ref >60)
Glucose, Bld: 103 mg/dL — ABNORMAL HIGH (ref 70–99)
Potassium: 3.9 mmol/L (ref 3.5–5.1)
Sodium: 139 mmol/L (ref 135–145)
Total Bilirubin: 0.4 mg/dL (ref 0.0–1.2)
Total Protein: 7.3 g/dL (ref 6.5–8.1)

## 2023-11-02 LAB — CBC WITH DIFFERENTIAL (CANCER CENTER ONLY)
Abs Immature Granulocytes: 0.02 K/uL (ref 0.00–0.07)
Basophils Absolute: 0 K/uL (ref 0.0–0.1)
Basophils Relative: 0 %
Eosinophils Absolute: 0.2 K/uL (ref 0.0–0.5)
Eosinophils Relative: 3 %
HCT: 33.5 % — ABNORMAL LOW (ref 36.0–46.0)
Hemoglobin: 11.4 g/dL — ABNORMAL LOW (ref 12.0–15.0)
Immature Granulocytes: 0 %
Lymphocytes Relative: 20 %
Lymphs Abs: 1.1 K/uL (ref 0.7–4.0)
MCH: 32.7 pg (ref 26.0–34.0)
MCHC: 34 g/dL (ref 30.0–36.0)
MCV: 96 fL (ref 80.0–100.0)
Monocytes Absolute: 0.5 K/uL (ref 0.1–1.0)
Monocytes Relative: 9 %
Neutro Abs: 3.9 K/uL (ref 1.7–7.7)
Neutrophils Relative %: 68 %
Platelet Count: 195 K/uL (ref 150–400)
RBC: 3.49 MIL/uL — ABNORMAL LOW (ref 3.87–5.11)
RDW: 14 % (ref 11.5–15.5)
WBC Count: 5.8 K/uL (ref 4.0–10.5)
nRBC: 0 % (ref 0.0–0.2)

## 2023-11-02 LAB — LACTATE DEHYDROGENASE: LDH: 179 U/L (ref 98–192)

## 2023-11-02 MED ORDER — LANREOTIDE ACETATE 120 MG/0.5ML ~~LOC~~ SOLN
120.0000 mg | Freq: Once | SUBCUTANEOUS | Status: AC
  Administered 2023-11-02: 120 mg via SUBCUTANEOUS
  Filled 2023-11-02: qty 120

## 2023-11-02 NOTE — Progress Notes (Signed)
 Hematology and Oncology Follow Up Visit  Leah [name] [id number] 13-May-1960 63 y.o. 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  She has completed all of her treatments for the Lutathera .  She feels last 1 in July.  Will set her up with a dotatate PET scan sometime in October or early November..    She feels well.  She has had no complaints.  There is been no problems with diarrhea.  She has had no cough or shortness of breath.  She has had no nausea or vomiting.  She has had no issues with fever.  She has had no rashes.  Her last Chromogranin A level back in July was 188.  Overall, I would say that her performance status is probably ECOG 1. .  Medications:  Allergies as of 11/02/2023   No Known Allergies      Medication List        Accurate as of November 02, 2023 12:42 PM. If you have any questions, ask your nurse or doctor.          albendazole  200 MG tablet Commonly known as: ALBENZA  TAKE 2 TABLETS BY MOUTH TODAY AND REPEAT IN 2 WEEKS   albuterol  108 (90 Base) MCG/ACT inhaler Commonly known as: VENTOLIN  HFA Inhale 2 puffs into the lungs every 6 (six) hours as needed for wheezing or shortness of breath.   amLODipine  10 MG tablet Commonly known as: NORVASC  TAKE 1 TABLET BY MOUTH EVERY DAY   clonazePAM  0.5 MG tablet Commonly known as: KLONOPIN  TAKE 1 TABLET BY MOUTH TWICE A DAY   docusate sodium  100 MG capsule Commonly known as: COLACE TAKE 1 CAPSULE BY MOUTH TWICE A DAY   fexofenadine  180 MG tablet Commonly known as: ALLEGRA  TAKE 1 TABLET BY MOUTH EVERY DAY   hydrOXYzine  10 MG tablet Commonly known as: ATARAX  Take 1 tablet (10 mg total) by mouth 3  (three) times daily as needed.   ibuprofen  800 MG tablet Commonly known as: ADVIL  Take 1 tablet (800 mg total) by mouth every 8 (eight) hours as needed for moderate pain (pain score 4-6) or cramping. Take with food.   Lutathera  370 MBQ/ML Soln Generic drug: lutetium Lu 177 dotatate  Inject 200 millicuries into the vein. Every 2 months.   metFORMIN  500 MG tablet Commonly known as: GLUCOPHAGE  Take 1 tablet (500 mg total) by mouth 2 (two) times daily with a meal.   methocarbamol  500 MG tablet Commonly known as: ROBAXIN  Take 1 tablet (500 mg total) by mouth every 6 (six) hours as needed for muscle spasms.   methylPREDNISolone  4 MG Tbpk tablet Commonly known as: MEDROL  DOSEPAK Take as directed on package   metoprolol  succinate 100 MG 24 hr tablet Commonly known as: TOPROL -XL TAKE 1 TABLET BY MOUTH EVERY DAY EVERY EVENING WITH OR IMMEDIATELY FOLLOWING A MEAL   montelukast  10 MG tablet Commonly known as: SINGULAIR  TAKE 1 TABLET BY MOUTH EVERYDAY AT BEDTIME   Mounjaro  2.5 MG/0.5ML Pen Generic drug: tirzepatide  Inject 2.5 mg into the skin once a week.   octreotide  30 MG injection Commonly known as: SANDOSTATIN  LAR Inject 30 mg into the muscle every 28 (twenty-eight) days.   ondansetron  4 MG  disintegrating tablet Commonly known as: ZOFRAN -ODT TAKE 1 TABLET BY MOUTH EVERY 8 HOURS AS NEEDED *INS MAX 18 TABS PER 21 DAYS*   ondansetron  8 MG tablet Commonly known as: ZOFRAN  Take 1 tablet (8 mg total) by mouth 2 (two) times daily as needed for nausea or vomiting.   pantoprazole  40 MG tablet Commonly known as: PROTONIX  TAKE 1 TABLET BY MOUTH TWICE A DAY   rosuvastatin  10 MG tablet Commonly known as: CRESTOR  TAKE 1 TABLET BY MOUTH EVERY DAY   scopolamine  1 MG/3DAYS Commonly known as: TRANSDERM-SCOP Place 1 patch (1.5 mg total) onto the skin every 3 (three) days.   valACYclovir  1000 MG tablet Commonly known as: VALTREX  Take 1,000 mg by mouth daily.   valsartan  320 MG  tablet Commonly known as: DIOVAN  TAKE 1 TABLET BY MOUTH EVERY DAY   venlafaxine  XR 150 MG 24 hr capsule Commonly known as: EFFEXOR -XR TAKE 1 CAPSULE BY MOUTH EVERY DAY IN THE MORNING        Allergies: No Known Allergies  Past Medical History, Surgical history, Social history, and Family History were reviewed and updated.  Review of Systems: Review of Systems  Constitutional:  Positive for malaise/fatigue.  HENT: Negative.    Eyes: Negative.   Respiratory: Negative.    Cardiovascular: Negative.   Gastrointestinal: Negative.   Genitourinary: Negative.   Musculoskeletal: Negative.   Skin: Negative.   Neurological: Negative.   Endo/Heme/Allergies: Negative.   Psychiatric/Behavioral: Negative.       Physical Exam:   Vital signs show temperature of 99.1.  Pulse 81.  Blood pressure 164/93.  Weight is 174 pounds.  Wt Readings from Last 3 Encounters:  10/27/23 174 lb (78.9 kg)  10/07/23 175 lb (79.4 kg)  09/29/23 173 lb 4 oz (78.6 kg)    Physical Exam Vitals reviewed.  HENT:     Head: Normocephalic and atraumatic.  Eyes:     Pupils: Pupils are equal, round, and reactive to light.  Cardiovascular:     Rate and Rhythm: Normal rate and regular rhythm.     Heart sounds: Normal heart sounds.  Pulmonary:     Effort: Pulmonary effort is normal.     Breath sounds: Normal breath sounds.  Abdominal:     General: Bowel sounds are normal.     Palpations: Abdomen is soft.  Musculoskeletal:        General: No tenderness or deformity. Normal range of motion.     Cervical back: Normal range of motion.  Lymphadenopathy:     Cervical: No cervical adenopathy.  Skin:    General: Skin is warm and dry.     Findings: No erythema or rash.  Neurological:     Mental Status: She is alert and oriented to person, place, and time.  Psychiatric:        Behavior: Behavior normal.        Thought Content: Thought content normal.        Judgment: Judgment normal.       Lab Results   Component Value Date   WBC 5.8 11/02/2023   HGB 11.4 (L) 11/02/2023   HCT 33.5 (L) 11/02/2023   MCV 96.0 11/02/2023   PLT 195 11/02/2023   Lab Results  Component Value Date   FERRITIN 382 (H) 10/18/2008   IRON <10 (L) 10/18/2008   TIBC NOT CALC Not calculated due to Iron <10. 10/18/2008   UIBC 156 10/18/2008   IRONPCTSAT NOT CALC Not calculated due to Iron <10. 10/18/2008   Lab  Results  Component Value Date   RETICCTPCT 0.9 10/18/2008   RBC 3.49 (L) 11/02/2023   No results found for: KPAFRELGTCHN, LAMBDASER, KAPLAMBRATIO No results found for: IGGSERUM, IGA, IGMSERUM No results found for: STEPHANY CARLOTA BENSON MARKEL EARLA JOANNIE DOC VICK, SPEI   Chemistry      Component Value Date/Time   NA 139 11/02/2023 1211   NA 140 01/20/2017 1416   NA 141 01/17/2016 1047   K 3.9 11/02/2023 1211   K 3.9 01/20/2017 1416   K 3.2 (L) 01/17/2016 1047   CL 102 11/02/2023 1211   CL 102 01/20/2017 1416   CO2 25 11/02/2023 1211   CO2 26 01/20/2017 1416   CO2 27 01/17/2016 1047   BUN 13 11/02/2023 1211   BUN 15 01/20/2017 1416   BUN 6.6 (L) 01/17/2016 1047   CREATININE 0.73 11/02/2023 1211   CREATININE 1.1 01/20/2017 1416   CREATININE 0.7 01/17/2016 1047      Component Value Date/Time   CALCIUM  9.2 11/02/2023 1211   CALCIUM  9.1 01/20/2017 1416   CALCIUM  8.7 01/17/2016 1047   ALKPHOS 77 11/02/2023 1211   ALKPHOS 84 01/20/2017 1416   ALKPHOS 90 01/17/2016 1047   AST 21 11/02/2023 1211   AST 34 01/17/2016 1047   ALT 17 11/02/2023 1211   ALT 35 01/20/2017 1416   ALT 38 01/17/2016 1047   BILITOT 0.4 11/02/2023 1211   BILITOT 0.42 01/17/2016 1047       Impression and Plan: Leah [name] is a very pleasant 63 yo caucasian female with long standing history of metastatic carcinoid with liver metastasis.  She has completed all of the Lutathera  treatments.  Again, we will set her up with another dotatate PET scan.  I will see about getting  this set up in about a month or so.Leah [name]  She will get her Somatuline today.  I am happy that she is doing well.  I know that she is quite busy at home.    Maude JONELLE Crease, MD 9/22/202512:42 PM

## 2023-11-02 NOTE — Patient Instructions (Signed)
 Lanreotide Injection What is this medication? LANREOTIDE (lan REE oh tide) treats high levels of growth hormone (acromegaly). It is used when other therapies have not worked well enough or cannot be tolerated. It works by reducing the amount of growth hormone your body makes. This reduces symptoms and the risk of health problems caused by too much growth hormone, such as diabetes and heart disease. It may also be used to treat neuroendocrine tumors, a cancer of the cells that release hormones and other substances in your body. It works by slowing down the release of these substances from the cells. This slows tumor growth. It also decreases the symptoms of carcinoid syndrome, such as flushing or diarrhea. This medicine may be used for other purposes; ask your health care provider or pharmacist if you have questions. COMMON BRAND NAME(S): Somatuline [place] What should I tell my care team before I take this medication? They need to know if you have any of these conditions: Diabetes Gallbladder disease Heart disease Kidney disease Liver disease Pancreatic disease Thyroid disease An unusual or allergic reaction to lanreotide, other medications, foods, dyes, or preservatives Pregnant or trying to get pregnant Breastfeeding How should I use this medication? This medication is injected under the skin. It is given by your care team in a hospital or clinic setting. Talk to your care team about the use of this medication in children. Special care may be needed. Overdosage: If you think you have taken too much of this medicine contact a poison control center or emergency room at once. NOTE: This medicine is only for you. Do not share this medicine with others. What if I miss a dose? Keep appointments for follow-up doses. It is important not to miss your dose. Call your care team if you are unable to keep an appointment. What may interact with this medication? Bromocriptine Cyclosporine Certain  medications for blood pressure, heart disease, irregular heartbeat Certain medications for diabetes Quinidine Terfenadine This list may not describe all possible interactions. Give your health care provider a list of all the medicines, herbs, non-prescription drugs, or dietary supplements you use. Also tell them if you smoke, drink alcohol, or use illegal drugs. Some items may interact with your medicine. What should I watch for while using this medication? Visit your care team for regular checks on your progress. Tell your care team if your symptoms do not start to get better or if they get worse. Your condition will be monitored carefully while you are receiving this medication. You may need blood work while you are taking this medication. This medication may increase blood sugar. The risk may be higher in patients who already have diabetes. Ask your care team what you can do to lower your risk of diabetes while taking this medication. Talk to your care team if you wish to become pregnant or think you may be pregnant. This medication can cause serious birth defects. Do not breast-feed while taking this medication and for 6 months after stopping therapy. This medication may cause infertility. Talk to your care team if you are concerned about your fertility. What side effects may I notice from receiving this medication? Side effects that you should report to your care team as soon as possible: Allergic reactions--skin rash, itching, hives, swelling of the face, lips, tongue, or throat Gallbladder problems--severe stomach pain, nausea, vomiting, fever High blood sugar (hyperglycemia)--increased thirst or amount of urine, unusual weakness or fatigue, blurry vision Increase in blood pressure Low blood sugar (hypoglycemia)--pale, blue or purple  skin or lips, sweating, fussiness, rapid heartbeat, poor feeding, low body temperature Low thyroid levels (hypothyroidism)--unusual weakness or fatigue,  increased sensitivity to cold, constipation, hair loss, dry skin, weight gain, feelings of depression Oily or light-colored stools, diarrhea, bloating, weight loss Slow heartbeat--dizziness, feeling faint or lightheaded, confusion, trouble breathing, unusual weakness or fatigue Side effects that usually do not require medical attention (report these to your care team if they continue or are bothersome): Diarrhea Dizziness Headache Muscle spasms Nausea Pain, redness, or irritation at injection site Stomach pain This list may not describe all possible side effects. Call your doctor for medical advice about side effects. You may report side effects to FDA at 1-800-FDA-1088. Where should I keep my medication? This medication is given in a hospital or clinic. It will not be stored at home. NOTE: This sheet is a summary. It may not cover all possible information. If you have questions about this medicine, talk to your doctor, pharmacist, or health care provider.  2024 Elsevier/Gold Standard (2023-01-09 00:00:00)

## 2023-11-02 NOTE — Telephone Encounter (Signed)
 Last OV: 08/25/23  Next OV: None  Last Filled: 10/21/22  Quantity: Rx was discontinued per chart

## 2023-11-03 LAB — CHROMOGRANIN A: Chromogranin A (ng/mL): 215.8 ng/mL — ABNORMAL HIGH (ref 0.0–101.8)

## 2023-11-07 ENCOUNTER — Other Ambulatory Visit: Payer: Self-pay | Admitting: Physician Assistant

## 2023-11-10 ENCOUNTER — Other Ambulatory Visit: Payer: Self-pay | Admitting: Physician Assistant

## 2023-11-10 DIAGNOSIS — E1169 Type 2 diabetes mellitus with other specified complication: Secondary | ICD-10-CM

## 2023-11-16 ENCOUNTER — Telehealth: Payer: Self-pay | Admitting: Physician Assistant

## 2023-11-16 ENCOUNTER — Other Ambulatory Visit: Payer: Self-pay | Admitting: Physician Assistant

## 2023-11-16 MED ORDER — METFORMIN HCL 500 MG PO TABS: 500.0000 mg | ORAL_TABLET | Freq: Two times a day (BID) | ORAL | 0 refills | Status: DC

## 2023-11-16 NOTE — Telephone Encounter (Unsigned)
 Copied from CRM [id number]. Topic: Clinical - Medication Refill >> Nov 16, 2023  3:14 PM Kendralyn S wrote: Medication:  metFORMIN  (GLUCOPHAGE ) 500 MG tablet   Has the patient contacted their pharmacy? Yes (Agent: If no, request that the patient contact the pharmacy for the refill. If patient does not wish to contact the pharmacy document the reason why and proceed with request.) (Agent: If yes, when and what did the pharmacy advise?)  This is the patient's preferred pharmacy:  [id number] - [place], [place] - [address] AT [address]NER OF [facility] [address] [place] [address] Phone: [phone] Fax: [phone]  Is this the correct pharmacy for this prescription? Yes If no, delete pharmacy and type the correct one.   Has the prescription been filled recently? No  Is the patient out of the medication? No  Has the patient been seen for an appointment in the last year OR does the patient have an upcoming appointment? Yes  Can we respond through MyChart? Yes  Agent: Please be advised that Rx refills may take up to 3 business days. We ask that you follow-up with your pharmacy.

## 2023-11-16 NOTE — Addendum Note (Signed)
 Addended by: CLAUDENE SHANDA ORN on: 11/16/2023 03:23 PM   Modules accepted: Orders

## 2023-11-16 NOTE — Telephone Encounter (Signed)
 Copied from CRM [id number]. Topic: Clinical - Medication Refill >> Nov 16, 2023  3:14 PM Kendralyn S wrote: Medication:  metFORMIN  (GLUCOPHAGE ) 500 MG tablet   Has the patient contacted their pharmacy? Yes (Agent: If no, request that the patient contact the pharmacy for the refill. If patient does not wish to contact the pharmacy document the reason why and proceed with request.) (Agent: If yes, when and what did the pharmacy advise?)  This is the patient's preferred pharmacy:  [id number] - [place], [place] - [address] AT [address] OF [facility] [address] [place] [address] Phone: [phone] Fax: [phone]  Is this the correct pharmacy for this prescription? Yes If no, delete pharmacy and type the correct one.   Has the prescription been filled recently? No  Is the patient out of the medication? No  Has the patient been seen for an appointment in the last year OR does the patient have an upcoming appointment? Yes  Can we respond through MyChart? Yes  Agent: Please be advised that Rx refills may take up to 3 business days. We ask that you follow-up with your pharmacy.

## 2023-11-24 ENCOUNTER — Encounter: Payer: Self-pay | Admitting: Physician Assistant

## 2023-11-24 ENCOUNTER — Ambulatory Visit (INDEPENDENT_AMBULATORY_CARE_PROVIDER_SITE_OTHER): Admitting: Physician Assistant

## 2023-11-24 VITALS — BP 144/84 | HR 80 | Temp 97.4°F | Ht 59.0 in | Wt 175.6 lb

## 2023-11-24 DIAGNOSIS — Z23 Encounter for immunization: Secondary | ICD-10-CM | POA: Diagnosis not present

## 2023-11-24 DIAGNOSIS — J208 Acute bronchitis due to other specified organisms: Secondary | ICD-10-CM

## 2023-11-24 DIAGNOSIS — E1169 Type 2 diabetes mellitus with other specified complication: Secondary | ICD-10-CM

## 2023-11-24 DIAGNOSIS — I10 Essential (primary) hypertension: Secondary | ICD-10-CM

## 2023-11-24 DIAGNOSIS — B9689 Other specified bacterial agents as the cause of diseases classified elsewhere: Secondary | ICD-10-CM

## 2023-11-24 DIAGNOSIS — Z7984 Long term (current) use of oral hypoglycemic drugs: Secondary | ICD-10-CM

## 2023-11-24 DIAGNOSIS — R42 Dizziness and giddiness: Secondary | ICD-10-CM

## 2023-11-24 LAB — POCT GLYCOSYLATED HEMOGLOBIN (HGB A1C): Hemoglobin A1C: 5.1 % (ref 4.0–5.6)

## 2023-11-24 MED ORDER — ALBUTEROL SULFATE HFA 108 (90 BASE) MCG/ACT IN AERS: 2.0000 | INHALATION_SPRAY | Freq: Four times a day (QID) | RESPIRATORY_TRACT | 0 refills | Status: AC | PRN

## 2023-11-24 MED ORDER — METFORMIN HCL 500 MG PO TABS: 500.0000 mg | ORAL_TABLET | Freq: Every day | ORAL | 1 refills | Status: AC

## 2023-11-24 NOTE — Progress Notes (Signed)
 Patient ID: Leah [name], female    DOB: 01-21-1961, 63 y.o.   MRN: [id number]   Assessment & Plan:  Type 2 diabetes mellitus with other specified complication, without long-term current use of insulin  (HCC) -     POCT glycosylated hemoglobin (Hb A1C)  Primary hypertension  Immunization due -     Flu vaccine trivalent PF, 6mos and older(Flulaval,Afluria,Fluarix,Fluzone)  Acute bacterial bronchitis -     Albuterol  Sulfate HFA; Inhale 2 puffs into the lungs every 6 (six) hours as needed for wheezing or shortness of breath.  Dispense: 8 g; Refill: 0  Other orders -     metFORMIN  HCl; Take 1 tablet (500 mg total) by mouth daily with breakfast.  Dispense: 90 tablet; Refill: 1     Assessment & Plan Encounter for immunization Due for a flu shot with no contraindications. - Administer flu shot.  Type 2 diabetes mellitus Well-controlled with an A1c of 5.1. Managed with metformin  twice daily and Mounjaro  2.5 mg weekly. Higher doses of Mounjaro  caused nausea. Insurance initially rejected Mounjaro  due to cost, later approved after appeal. - Reduce metformin  to once daily. - Refill metformin  prescription. - Continue Mounjaro  2.5 mg weekly. Lab Results  Component Value Date   HGBA1C 5.1 11/24/2023   HGBA1C 5.4 04/07/2023   HGBA1C 5.4 04/02/2022     Hypertension Managed with amlodipine , metoprolol  100 mg, and valsartan  320 mg. Blood pressure fluctuates with activity. Significant increase in blood pressure when metoprolol  was missed per patient. Track at home. - Continue current antihypertensive regimen.  Dizziness/vertigo Symptoms have improved. Exacerbated by higher medication doses. Some symptoms attributed to previous Lutathera  treatment.      Return in about 4 months (around 03/26/2024) for recheck/follow-up.    Subjective:    Chief Complaint  Patient presents with   Medication Refill    Pt in office for medication refills;     Medication Refill   Discussed  the use of AI scribe software for clinical note transcription with the patient, who gave verbal consent to proceed.  History of Present Illness Leah [name] is a 63 year old female who presents for follow-up on her blood pressure and diabetes management.  Dizziness has improved since adjusting her medication regimen. She previously experienced dizziness with a 1000 mg dose of Valtrex , which was alleviated by reducing the dose to 500 mg. She attributes some past symptoms to a recent Lutathera  treatment, after which she feels stronger and more capable of performing daily activities, such as painting her bedroom.  Blood pressure has been variable, with episodes of dyspnea and needing to rest during physical activities. Blood pressure stabilizes at rest. She is currently taking amlodipine , Toprol  (100 mg), and valsartan  (320 mg) for management. She recalls a blood pressure spike after missing a dose of metoprolol .  Diabetes management includes metformin  twice a day and Mounjaro  once a week at a 2.5 mg dose. She was not taking Mounjaro  during her Lutathera  treatment and other recent health issues. She experiences nausea with higher doses of Mounjaro . Her recent A1c was 5.1, improved from 6.4 three years ago. She reports dietary changes in the household following her husband's health condition.  Her husband had most of his stomach removed due to cancer, prompting dietary changes in their household. Her husband's family has a history of colon cancer, but her sons tested negative for the gene.      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   Cataract    Constipation    Diabetes (HCC)    Dyspnea    with exertion   Fatty liver    Gallbladder problem    GE reflux    Hepatic encephalopathy (HCC) 2011   History of PCR DNA positive  for HSV1    History of retinal vein occlusion, right eye    HTN (hypertension) 2011   Joint pain    Liver tumor    Neuroendocrine tumor (HCC) 2011   Orthostatic hypotension    Osteoarthritis    Osteopenia    Palpitations    Sarcoidosis 2008   Sleep apnea    uses cpap   Surgical menopause     Past Surgical History:  Procedure Laterality Date   ABDOMINAL HYSTERECTOMY  2004   AUGMENTATION MAMMAPLASTY     BREAST BIOPSY Left    BREAST RECONSTRUCTION Bilateral 05/07/2020   Procedure: Revision of bilateral breast reconstruction;  Surgeon: Elisabeth Craig RAMAN, MD;  Location: [facility];  Service: Plastics;  Laterality: Bilateral;  2 hours   CESAREAN SECTION  1999,2001   CHOLECYSTECTOMY  2006   COSMETIC SURGERY     Breast reconstruction   EYE SURGERY     Lasik   HERNIA REPAIR     JOINT REPLACEMENT     Left knee replacement   KNEE ARTHROSCOPY Left 09/2010   LAPAROSCOPIC HEPATECTOMY     LIPOSUCTION Bilateral 05/07/2020   Procedure: LIPOSUCTION with excision of lateral breast and axillary tissue;  Surgeon: Elisabeth Craig RAMAN, MD;  Location: [facility];  Service: Plastics;  Laterality: Bilateral;   LIVER SURGERY     MASTECTOMY Bilateral 2008   NASAL SINUS SURGERY     OOPHORECTOMY  2004   RECONSTRUCTION BREAST W/ LATISSIMUS DORSI FLAP Bilateral    TONSILLECTOMY  1976   TOTAL KNEE ARTHROPLASTY Left 10/29/2020   Procedure: TOTAL KNEE ARTHROPLASTY;  Surgeon: Melodi Lerner, MD;  Location: [facility];  Service: Orthopedics;  Laterality: Left;   TOTAL KNEE ARTHROPLASTY Right 04/14/2022   Procedure: TOTAL KNEE ARTHROPLASTY;  Surgeon: Melodi Lerner, MD;  Location: [facility];  Service: Orthopedics;  Laterality: Right;   TUBAL LIGATION Bilateral 2001    Family History  Problem Relation Age of Onset   Breast cancer Mother 39   Lupus Mother    Heart failure Father    Hypertension Father    Hyperlipidemia Father    Sudden death Father    Obesity Father    Breast  cancer Maternal Aunt 70   Ovarian cancer Maternal Aunt 60   Breast cancer Maternal Aunt 40   Arthritis Maternal Grandmother    Arthritis Maternal Grandfather    Arthritis Paternal Grandmother    Arthritis Paternal Grandfather    Heart failure Paternal Grandfather    Colon polyps Neg Hx    Colon cancer Neg Hx    Esophageal cancer Neg Hx    Stomach cancer Neg Hx    Rectal cancer Neg Hx     Social History   Tobacco Use   Smoking status: Never   Smokeless tobacco: Never   Tobacco comments:    never used tobacco  Vaping Use   Vaping status: Never Used  Substance Use Topics   Alcohol use: No    Alcohol/week: 0.0 standard drinks of alcohol   Drug  use: No     No Known Allergies  Review of Systems NEGATIVE UNLESS OTHERWISE INDICATED IN HPI      Objective:     BP (!) 144/84 (BP Location: Left Arm, Patient Position: Sitting, Cuff Size: Normal) Comment (Patient Position): lower arm reading  Pulse 80   Temp (!) 97.4 F (36.3 C) (Temporal)   Ht 4' 11 (1.499 m)   Wt 175 lb 9.6 oz (79.7 kg)   SpO2 99%   BMI 35.47 kg/m   Wt Readings from Last 3 Encounters:  11/24/23 175 lb 9.6 oz (79.7 kg)  10/27/23 174 lb (78.9 kg)  10/07/23 175 lb (79.4 kg)    BP Readings from Last 3 Encounters:  11/24/23 (!) 144/84  11/02/23 (!) 152/96  10/27/23 124/82     Physical Exam Vitals and nursing note reviewed.  Constitutional:      Appearance: Normal appearance. She is normal weight. She is not toxic-appearing.  HENT:     Head: Normocephalic and atraumatic.  Eyes:     Extraocular Movements: Extraocular movements intact.     Conjunctiva/sclera: Conjunctivae normal.     Pupils: Pupils are equal, round, and reactive to light.  Cardiovascular:     Rate and Rhythm: Normal rate and regular rhythm.     Pulses: Normal pulses.     Heart sounds: Normal heart sounds.  Pulmonary:     Effort: Pulmonary effort is normal.     Breath sounds: Normal breath sounds.  Musculoskeletal:         General: Normal range of motion.     Cervical back: Normal range of motion and neck supple.  Skin:    General: Skin is warm and dry.  Neurological:     General: No focal deficit present.     Mental Status: She is alert and oriented to person, place, and time.     Cranial Nerves: No cranial nerve deficit.     Sensory: No sensory deficit.     Motor: No weakness.     Gait: Gait normal.  Psychiatric:        Mood and Affect: Mood normal.        Behavior: Behavior normal.             Curby Carswell M Alilah Mcmeans, PA-C

## 2023-11-24 NOTE — Patient Instructions (Signed)
  VISIT SUMMARY: You came in for a follow-up on your blood pressure and diabetes management. Your dizziness has improved, and your blood pressure and diabetes are being managed with your current medications. You also received a flu shot during this visit.  YOUR PLAN: TYPE 2 DIABETES MELLITUS: Your diabetes is well-controlled with an A1c of 5.1. You are currently taking metformin  and Mounjaro . -Reduce metformin  to once daily. -Refill metformin  prescription. -Continue Mounjaro  2.5 mg weekly.  HYPERTENSION: Your blood pressure is being managed with amlodipine , metoprolol , and valsartan . It fluctuates with activity but is stable at rest. -Continue current antihypertensive regimen. - Track at home, goal is less than 140/90.  DIZZINESS/VERTIGO: Your dizziness has improved and was previously exacerbated by higher medication doses and possibly by Lutathera  treatment. -No changes needed at this time.  ENCOUNTER FOR IMMUNIZATION: You were due for a flu shot and had no contraindications. -Administered flu shot.                      Contains text generated by Abridge.                                 Contains text generated by Abridge.

## 2023-11-30 ENCOUNTER — Other Ambulatory Visit: Payer: Self-pay | Admitting: Physician Assistant

## 2023-11-30 DIAGNOSIS — F419 Anxiety disorder, unspecified: Secondary | ICD-10-CM

## 2023-12-01 ENCOUNTER — Ambulatory Visit: Admitting: Physician Assistant

## 2023-12-03 ENCOUNTER — Ambulatory Visit: Payer: Self-pay | Admitting: Hematology & Oncology

## 2023-12-03 ENCOUNTER — Encounter (HOSPITAL_COMMUNITY)
Admission: RE | Admit: 2023-12-03 | Discharge: 2023-12-03 | Disposition: A | Discharge status: Home / Self Care | Source: Ambulatory Visit | Attending: Hematology & Oncology | Admitting: Hematology & Oncology

## 2023-12-03 DIAGNOSIS — C7A1 Malignant poorly differentiated neuroendocrine tumors: Secondary | ICD-10-CM | POA: Diagnosis not present

## 2023-12-03 DIAGNOSIS — K769 Liver disease, unspecified: Secondary | ICD-10-CM | POA: Diagnosis not present

## 2023-12-03 DIAGNOSIS — C787 Secondary malignant neoplasm of liver and intrahepatic bile duct: Secondary | ICD-10-CM | POA: Diagnosis not present

## 2023-12-03 MED ORDER — COPPER CU 64 DOTATATE 1 MCI/ML IV SOLN
4.0000 | Freq: Once | INTRAVENOUS | Status: AC
  Administered 2023-12-03: 4.05 via INTRAVENOUS

## 2023-12-10 ENCOUNTER — Other Ambulatory Visit: Payer: Self-pay | Admitting: Hematology & Oncology

## 2023-12-14 ENCOUNTER — Ambulatory Visit: Admitting: Hematology & Oncology

## 2023-12-14 ENCOUNTER — Inpatient Hospital Stay

## 2023-12-14 ENCOUNTER — Ambulatory Visit

## 2023-12-21 ENCOUNTER — Inpatient Hospital Stay (HOSPITAL_BASED_OUTPATIENT_CLINIC_OR_DEPARTMENT_OTHER): Admitting: Hematology & Oncology

## 2023-12-21 ENCOUNTER — Encounter: Payer: Self-pay | Admitting: Hematology & Oncology

## 2023-12-21 ENCOUNTER — Inpatient Hospital Stay: Attending: Hematology & Oncology

## 2023-12-21 ENCOUNTER — Inpatient Hospital Stay

## 2023-12-21 VITALS — BP 151/77 | HR 75

## 2023-12-21 VITALS — BP 148/77 | HR 76 | Temp 98.7°F | Resp 20 | Ht 59.0 in | Wt 176.8 lb

## 2023-12-21 DIAGNOSIS — C7A00 Malignant carcinoid tumor of unspecified site: Secondary | ICD-10-CM | POA: Diagnosis not present

## 2023-12-21 DIAGNOSIS — C7B8 Other secondary neuroendocrine tumors: Secondary | ICD-10-CM

## 2023-12-21 DIAGNOSIS — C7B02 Secondary carcinoid tumors of liver: Secondary | ICD-10-CM | POA: Diagnosis not present

## 2023-12-21 DIAGNOSIS — K769 Liver disease, unspecified: Secondary | ICD-10-CM

## 2023-12-21 DIAGNOSIS — E3400 Carcinoid syndrome, unspecified: Secondary | ICD-10-CM

## 2023-12-21 LAB — CBC WITH DIFFERENTIAL (CANCER CENTER ONLY)
Abs Immature Granulocytes: 0.01 K/uL (ref 0.00–0.07)
Basophils Absolute: 0 K/uL (ref 0.0–0.1)
Basophils Relative: 1 %
Eosinophils Absolute: 0.1 K/uL (ref 0.0–0.5)
Eosinophils Relative: 2 %
HCT: 32.8 % — ABNORMAL LOW (ref 36.0–46.0)
Hemoglobin: 11.1 g/dL — ABNORMAL LOW (ref 12.0–15.0)
Immature Granulocytes: 0 %
Lymphocytes Relative: 17 %
Lymphs Abs: 1.1 K/uL (ref 0.7–4.0)
MCH: 33.1 pg (ref 26.0–34.0)
MCHC: 33.8 g/dL (ref 30.0–36.0)
MCV: 97.9 fL (ref 80.0–100.0)
Monocytes Absolute: 0.5 K/uL (ref 0.1–1.0)
Monocytes Relative: 8 %
Neutro Abs: 4.8 K/uL (ref 1.7–7.7)
Neutrophils Relative %: 72 %
Platelet Count: 187 K/uL (ref 150–400)
RBC: 3.35 MIL/uL — ABNORMAL LOW (ref 3.87–5.11)
RDW: 12.7 % (ref 11.5–15.5)
WBC Count: 6.6 K/uL (ref 4.0–10.5)
nRBC: 0 % (ref 0.0–0.2)

## 2023-12-21 LAB — CMP (CANCER CENTER ONLY)
ALT: 14 U/L (ref 0–44)
AST: 18 U/L (ref 15–41)
Albumin: 4.1 g/dL (ref 3.5–5.0)
Alkaline Phosphatase: 86 U/L (ref 38–126)
Anion gap: 11 (ref 5–15)
BUN: 17 mg/dL (ref 8–23)
CO2: 25 mmol/L (ref 22–32)
Calcium: 9.3 mg/dL (ref 8.9–10.3)
Chloride: 106 mmol/L (ref 98–111)
Creatinine: 0.89 mg/dL (ref 0.44–1.00)
GFR, Estimated: >60 mL/min (ref >60)
Glucose, Bld: 106 mg/dL — ABNORMAL HIGH (ref 70–99)
Potassium: 4.9 mmol/L (ref 3.5–5.1)
Sodium: 141 mmol/L (ref 135–145)
Total Bilirubin: 0.5 mg/dL (ref 0.0–1.2)
Total Protein: 7.4 g/dL (ref 6.5–8.1)

## 2023-12-21 MED ORDER — LANREOTIDE ACETATE 120 MG/0.5ML ~~LOC~~ SOLN
120.0000 mg | Freq: Once | SUBCUTANEOUS | Status: AC
  Administered 2023-12-21: 120 mg via SUBCUTANEOUS
  Filled 2023-12-21: qty 120

## 2023-12-21 NOTE — Progress Notes (Signed)
 BP remains elevated. 148/77. Instructed to monitor at home and notify PCP if it remains over 140/90. Verbalized understanding.

## 2023-12-21 NOTE — Progress Notes (Signed)
 Hematology and Oncology Follow Up Visit  Leah [name] [id number] 03-Sep-1960 63 y.o. 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  The Lutathera  worked incredibly well.  She had a dotatate PET scan that was done.  This, I think that showed that she had a very nice response.  The scan was done on 12/03/2023.  Thankfully, the scan showed that she had marked interval decrease in the tumor in the left hepatic lobe.  She also had resolution of a subcapsular lesion in the left hepatic lobe.  I am happy that everything is worked out so well for her.  She is doing all right.  She really has had no complaints.   Of note, her last Chromogranin A level was 215.  She has had no diarrhea.  She really needs to have the Somatuline done monthly..  She has had no fever.  She has had no bleeding.  She has had no rashes.  Overall, I will say that her performance status is probably ECOG 1. .  Medications:  Allergies as of 12/21/2023   No Known Allergies      Medication List        Accurate as of December 21, 2023 10:50 AM. If you have any questions, ask your nurse or doctor.          STOP taking these medications    Lutathera  370 MBQ/ML Soln Generic drug: lutetium Lu 177 dotatate  Stopped by: Maude JONELLE Crease   methylPREDNISolone  4 MG Tbpk tablet Commonly known as: MEDROL  DOSEPAK Stopped by: Camora Tremain R Braeton Wolgamott       TAKE these medications    albendazole  200 MG tablet Commonly known as: ALBENZA  TAKE 2 TABLETS BY MOUTH TODAY AND REPEAT IN 2 WEEKS   albuterol  108 (90 Base) MCG/ACT inhaler Commonly known as: VENTOLIN  HFA Inhale 2 puffs into the lungs every 6 (six) hours as needed  for wheezing or shortness of breath.   amLODipine  10 MG tablet Commonly known as: NORVASC  TAKE 1 TABLET BY MOUTH EVERY DAY   clonazePAM  0.5 MG tablet Commonly known as: KLONOPIN  TAKE 1 TABLET BY MOUTH TWICE A DAY   docusate sodium  100 MG capsule Commonly known as: COLACE TAKE 1 CAPSULE BY MOUTH TWICE A DAY   fexofenadine  180 MG tablet Commonly known as: ALLEGRA  TAKE 1 TABLET BY MOUTH EVERY DAY   gabapentin  100 MG capsule Commonly known as: NEURONTIN  TAKE 1 CAPSULE BY MOUTH AT BEDTIME.   hydrOXYzine  10 MG tablet Commonly known as: ATARAX  Take 1 tablet (10 mg total) by mouth 3 (three) times daily as needed.   ibuprofen  800 MG tablet Commonly known as: ADVIL  Take 1 tablet (800 mg total) by mouth every 8 (eight) hours as needed for moderate pain (pain score 4-6) or cramping. Take with food.   metFORMIN  500 MG tablet Commonly known as: GLUCOPHAGE  Take 1 tablet (500 mg total) by mouth daily with breakfast.   methocarbamol  500 MG tablet Commonly known as: ROBAXIN  Take 1 tablet (500 mg total) by mouth every 6 (six) hours as needed for muscle spasms.   metoprolol  succinate 100 MG 24 hr tablet Commonly known as: TOPROL -XL TAKE 1 TABLET BY MOUTH EVERY DAY EVERY  EVENING WITH OR IMMEDIATELY FOLLOWING A MEAL   montelukast  10 MG tablet Commonly known as: SINGULAIR  TAKE 1 TABLET BY MOUTH EVERYDAY AT BEDTIME   Mounjaro  2.5 MG/0.5ML Pen Generic drug: tirzepatide  Inject 2.5 mg into the skin once a week.   octreotide  30 MG injection Commonly known as: SANDOSTATIN  LAR Inject 30 mg into the muscle every 28 (twenty-eight) days.   ondansetron  4 MG disintegrating tablet Commonly known as: ZOFRAN -ODT TAKE 1 TABLET BY MOUTH EVERY 8 HOURS AS NEEDED *INS MAX 18 TABS PER 21 DAYS*   ondansetron  8 MG tablet Commonly known as: ZOFRAN  Take 1 tablet (8 mg total) by mouth 2 (two) times daily as needed for nausea or vomiting.   pantoprazole  40 MG tablet Commonly known as: PROTONIX  TAKE 1  TABLET BY MOUTH TWICE A DAY   rosuvastatin  10 MG tablet Commonly known as: CRESTOR  TAKE 1 TABLET BY MOUTH EVERY DAY   scopolamine  1 MG/3DAYS Commonly known as: TRANSDERM-SCOP Place 1 patch (1.5 mg total) onto the skin every 3 (three) days.   valACYclovir  1000 MG tablet Commonly known as: VALTREX  Take 1,000 mg by mouth daily. What changed: how much to take   valsartan  320 MG tablet Commonly known as: DIOVAN  TAKE 1 TABLET BY MOUTH EVERY DAY   venlafaxine  XR 150 MG 24 hr capsule Commonly known as: EFFEXOR -XR TAKE 1 CAPSULE BY MOUTH EVERY DAY IN THE MORNING        Allergies: No Known Allergies  Past Medical History, Surgical history, Social history, and Family History were reviewed and updated.  Review of Systems: Review of Systems  Constitutional:  Positive for malaise/fatigue.  HENT: Negative.    Eyes: Negative.   Respiratory: Negative.    Cardiovascular: Negative.   Gastrointestinal: Negative.   Genitourinary: Negative.   Musculoskeletal: Negative.   Skin: Negative.   Neurological: Negative.   Endo/Heme/Allergies: Negative.   Psychiatric/Behavioral: Negative.       Physical Exam:   Vital signs show temperature of 98.7.  Pulse 76.  Blood pressure 171/80.  Weight is 2 1 176 pounds.    Wt Readings from Last 3 Encounters:  11/24/23 175 lb 9.6 oz (79.7 kg)  10/27/23 174 lb (78.9 kg)  10/07/23 175 lb (79.4 kg)    Physical Exam Vitals reviewed.  HENT:     Head: Normocephalic and atraumatic.  Eyes:     Pupils: Pupils are equal, round, and reactive to light.  Cardiovascular:     Rate and Rhythm: Normal rate and regular rhythm.     Heart sounds: Normal heart sounds.  Pulmonary:     Effort: Pulmonary effort is normal.     Breath sounds: Normal breath sounds.  Abdominal:     General: Bowel sounds are normal.     Palpations: Abdomen is soft.  Musculoskeletal:        General: No tenderness or deformity. Normal range of motion.     Cervical back: Normal  range of motion.  Lymphadenopathy:     Cervical: No cervical adenopathy.  Skin:    General: Skin is warm and dry.     Findings: No erythema or rash.  Neurological:     Mental Status: She is alert and oriented to person, place, and time.  Psychiatric:        Behavior: Behavior normal.        Thought Content: Thought content normal.        Judgment: Judgment normal.       Lab Results  Component Value Date  WBC 6.6 12/21/2023   HGB 11.1 (L) 12/21/2023   HCT 32.8 (L) 12/21/2023   MCV 97.9 12/21/2023   PLT 187 12/21/2023   Lab Results  Component Value Date   FERRITIN 382 (H) 10/18/2008   IRON <10 (L) 10/18/2008   TIBC NOT CALC Not calculated due to Iron <10. 10/18/2008   UIBC 156 10/18/2008   IRONPCTSAT NOT CALC Not calculated due to Iron <10. 10/18/2008   Lab Results  Component Value Date   RETICCTPCT 0.9 10/18/2008   RBC 3.35 (L) 12/21/2023   No results found for: KPAFRELGTCHN, LAMBDASER, KAPLAMBRATIO No results found for: IGGSERUM, IGA, IGMSERUM No results found for: STEPHANY CARLOTA BENSON MARKEL EARLA JOANNIE DOC VICK, SPEI   Chemistry      Component Value Date/Time   NA 139 11/02/2023 1211   NA 140 01/20/2017 1416   NA 141 01/17/2016 1047   K 3.9 11/02/2023 1211   K 3.9 01/20/2017 1416   K 3.2 (L) 01/17/2016 1047   CL 102 11/02/2023 1211   CL 102 01/20/2017 1416   CO2 25 11/02/2023 1211   CO2 26 01/20/2017 1416   CO2 27 01/17/2016 1047   BUN 13 11/02/2023 1211   BUN 15 01/20/2017 1416   BUN 6.6 (L) 01/17/2016 1047   CREATININE 0.73 11/02/2023 1211   CREATININE 1.1 01/20/2017 1416   CREATININE 0.7 01/17/2016 1047      Component Value Date/Time   CALCIUM  9.2 11/02/2023 1211   CALCIUM  9.1 01/20/2017 1416   CALCIUM  8.7 01/17/2016 1047   ALKPHOS 77 11/02/2023 1211   ALKPHOS 84 01/20/2017 1416   ALKPHOS 90 01/17/2016 1047   AST 21 11/02/2023 1211   AST 34 01/17/2016 1047   ALT 17 11/02/2023 1211   ALT 35  01/20/2017 1416   ALT 38 01/17/2016 1047   BILITOT 0.4 11/02/2023 1211   BILITOT 0.42 01/17/2016 1047       Impression and Plan: Ms. Seawright is a very pleasant 63 yo caucasian female with long standing history of metastatic carcinoid with liver metastasis.  She has completed all of the Lutathera  treatments.  The PET scan looks great.  So far, everything is done very nicely for her..  She will continue the Somatuline monthly.  This really does seem to help her quality of life.  Will have her come back [facility] for the Somatuline and labs in a month.    Maude JONELLE Crease, MD 11/10/202510:50 AM

## 2023-12-23 LAB — CHROMOGRANIN A: Chromogranin A (ng/mL): 141.8 ng/mL — ABNORMAL HIGH (ref 0.0–101.8)

## 2023-12-24 ENCOUNTER — Other Ambulatory Visit: Payer: Self-pay | Admitting: Physician Assistant

## 2024-01-08 ENCOUNTER — Other Ambulatory Visit: Payer: Self-pay | Admitting: Physician Assistant

## 2024-01-18 ENCOUNTER — Encounter: Payer: Self-pay | Admitting: Hematology & Oncology

## 2024-01-19 ENCOUNTER — Telehealth: Payer: Self-pay

## 2024-01-19 NOTE — Telephone Encounter (Signed)
 Copied from CRM #[id number]. Topic: Clinical - Medication Question >> Jan 19, 2024 10:44 AM Vena HERO wrote: Reason for CRM: Pt is wanting to get back on Estrogen after being off for years, seen on 10/14. If agreeable, please send to pharmacy on file. Call pt to advise  Pt will need OV with PCP to discuss; please schedule pt for OV or VV with PCP.

## 2024-01-19 NOTE — Telephone Encounter (Signed)
 Scheduled for 02/15/24

## 2024-01-20 ENCOUNTER — Encounter: Payer: Self-pay | Admitting: Medical Oncology

## 2024-01-20 ENCOUNTER — Inpatient Hospital Stay: Attending: Hematology & Oncology

## 2024-01-20 ENCOUNTER — Inpatient Hospital Stay

## 2024-01-20 ENCOUNTER — Inpatient Hospital Stay (HOSPITAL_BASED_OUTPATIENT_CLINIC_OR_DEPARTMENT_OTHER): Admitting: Medical Oncology

## 2024-01-20 VITALS — BP 158/84 | HR 76 | Temp 99.1°F | Resp 18 | Wt 177.0 lb

## 2024-01-20 DIAGNOSIS — C7B8 Other secondary neuroendocrine tumors: Secondary | ICD-10-CM

## 2024-01-20 DIAGNOSIS — C7A00 Malignant carcinoid tumor of unspecified site: Secondary | ICD-10-CM | POA: Diagnosis not present

## 2024-01-20 DIAGNOSIS — B001 Herpesviral vesicular dermatitis: Secondary | ICD-10-CM | POA: Diagnosis not present

## 2024-01-20 DIAGNOSIS — C7B02 Secondary carcinoid tumors of liver: Secondary | ICD-10-CM | POA: Diagnosis not present

## 2024-01-20 LAB — CMP (CANCER CENTER ONLY)
ALT: 13 U/L (ref 0–44)
AST: 17 U/L (ref 15–41)
Albumin: 4.2 g/dL (ref 3.5–5.0)
Alkaline Phosphatase: 89 U/L (ref 38–126)
Anion gap: 10 (ref 5–15)
BUN: 16 mg/dL (ref 8–23)
CO2: 25 mmol/L (ref 22–32)
Calcium: 9.4 mg/dL (ref 8.9–10.3)
Chloride: 105 mmol/L (ref 98–111)
Creatinine: 0.97 mg/dL (ref 0.44–1.00)
GFR, Estimated: >60 mL/min (ref >60)
Glucose, Bld: 101 mg/dL — ABNORMAL HIGH (ref 70–99)
Potassium: 5.2 mmol/L — ABNORMAL HIGH (ref 3.5–5.1)
Sodium: 140 mmol/L (ref 135–145)
Total Bilirubin: 0.5 mg/dL (ref 0.0–1.2)
Total Protein: 7.5 g/dL (ref 6.5–8.1)

## 2024-01-20 LAB — CBC WITH DIFFERENTIAL (CANCER CENTER ONLY)
Abs Immature Granulocytes: 0.03 K/uL (ref 0.00–0.07)
Basophils Absolute: 0 K/uL (ref 0.0–0.1)
Basophils Relative: 0 %
Eosinophils Absolute: 0.1 K/uL (ref 0.0–0.5)
Eosinophils Relative: 2 %
HCT: 34.6 % — ABNORMAL LOW (ref 36.0–46.0)
Hemoglobin: 11.4 g/dL — ABNORMAL LOW (ref 12.0–15.0)
Immature Granulocytes: 0 %
Lymphocytes Relative: 17 %
Lymphs Abs: 1.2 K/uL (ref 0.7–4.0)
MCH: 32.5 pg (ref 26.0–34.0)
MCHC: 32.9 g/dL (ref 30.0–36.0)
MCV: 98.6 fL (ref 80.0–100.0)
Monocytes Absolute: 0.6 K/uL (ref 0.1–1.0)
Monocytes Relative: 8 %
Neutro Abs: 5.1 K/uL (ref 1.7–7.7)
Neutrophils Relative %: 73 %
Platelet Count: 193 K/uL (ref 150–400)
RBC: 3.51 MIL/uL — ABNORMAL LOW (ref 3.87–5.11)
RDW: 12.4 % (ref 11.5–15.5)
WBC Count: 7.1 K/uL (ref 4.0–10.5)
nRBC: 0 % (ref 0.0–0.2)

## 2024-01-20 MED ORDER — LANREOTIDE ACETATE 120 MG/0.5ML ~~LOC~~ SOLN
120.0000 mg | Freq: Once | SUBCUTANEOUS | Status: AC
  Administered 2024-01-20: 120 mg via SUBCUTANEOUS
  Filled 2024-01-20: qty 0.5

## 2024-01-20 MED ORDER — ACYCLOVIR 400 MG PO TABS: 400.0000 mg | ORAL_TABLET | Freq: Three times a day (TID) | ORAL | 0 refills | Status: AC

## 2024-01-20 NOTE — Patient Instructions (Signed)
 Lanreotide Injection What is this medication? LANREOTIDE (lan REE oh tide) treats high levels of growth hormone (acromegaly). It is used when other therapies have not worked well enough or cannot be tolerated. It works by reducing the amount of growth hormone your body makes. This reduces symptoms and the risk of health problems caused by too much growth hormone, such as diabetes and heart disease. It may also be used to treat neuroendocrine tumors, a cancer of the cells that release hormones and other substances in your body. It works by slowing down the release of these substances from the cells. This slows tumor growth. It also decreases the symptoms of carcinoid syndrome, such as flushing or diarrhea. This medicine may be used for other purposes; ask your health care provider or pharmacist if you have questions. COMMON BRAND NAME(S): Somatuline [place] What should I tell my care team before I take this medication? They need to know if you have any of these conditions: Diabetes Gallbladder disease Heart disease Kidney disease Liver disease Pancreatic disease Thyroid disease An unusual or allergic reaction to lanreotide, other medications, foods, dyes, or preservatives Pregnant or trying to get pregnant Breastfeeding How should I use this medication? This medication is injected under the skin. It is given by your care team in a hospital or clinic setting. Talk to your care team about the use of this medication in children. Special care may be needed. Overdosage: If you think you have taken too much of this medicine contact a poison control center or emergency room at once. NOTE: This medicine is only for you. Do not share this medicine with others. What if I miss a dose? Keep appointments for follow-up doses. It is important not to miss your dose. Call your care team if you are unable to keep an appointment. What may interact with this medication? Bromocriptine Cyclosporine Certain  medications for blood pressure, heart disease, irregular heartbeat Certain medications for diabetes Quinidine Terfenadine This list may not describe all possible interactions. Give your health care provider a list of all the medicines, herbs, non-prescription drugs, or dietary supplements you use. Also tell them if you smoke, drink alcohol, or use illegal drugs. Some items may interact with your medicine. What should I watch for while using this medication? Visit your care team for regular checks on your progress. Tell your care team if your symptoms do not start to get better or if they get worse. Your condition will be monitored carefully while you are receiving this medication. You may need blood work while you are taking this medication. This medication may increase blood sugar. The risk may be higher in patients who already have diabetes. Ask your care team what you can do to lower your risk of diabetes while taking this medication. Talk to your care team if you wish to become pregnant or think you may be pregnant. This medication can cause serious birth defects. Do not breast-feed while taking this medication and for 6 months after stopping therapy. This medication may cause infertility. Talk to your care team if you are concerned about your fertility. What side effects may I notice from receiving this medication? Side effects that you should report to your care team as soon as possible: Allergic reactions--skin rash, itching, hives, swelling of the face, lips, tongue, or throat Gallbladder problems--severe stomach pain, nausea, vomiting, fever High blood sugar (hyperglycemia)--increased thirst or amount of urine, unusual weakness or fatigue, blurry vision Increase in blood pressure Low blood sugar (hypoglycemia)--pale, blue or purple  skin or lips, sweating, fussiness, rapid heartbeat, poor feeding, low body temperature Low thyroid levels (hypothyroidism)--unusual weakness or fatigue,  increased sensitivity to cold, constipation, hair loss, dry skin, weight gain, feelings of depression Oily or light-colored stools, diarrhea, bloating, weight loss Slow heartbeat--dizziness, feeling faint or lightheaded, confusion, trouble breathing, unusual weakness or fatigue Side effects that usually do not require medical attention (report these to your care team if they continue or are bothersome): Diarrhea Dizziness Headache Muscle spasms Nausea Pain, redness, or irritation at injection site Stomach pain This list may not describe all possible side effects. Call your doctor for medical advice about side effects. You may report side effects to FDA at 1-800-FDA-1088. Where should I keep my medication? This medication is given in a hospital or clinic. It will not be stored at home. NOTE: This sheet is a summary. It may not cover all possible information. If you have questions about this medicine, talk to your doctor, pharmacist, or health care provider.  2024 Elsevier/Gold Standard (2023-01-09 00:00:00)

## 2024-01-20 NOTE — Progress Notes (Signed)
 Hematology and Oncology Follow Up Visit  [name] [id number] April 28, 1960 63 y.o. 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 Leah [name] is here today for follow-up and Somatuline injection.  The Lutathera  worked incredibly well.  She had a dotatate PET scan performed on 12/03/2023- this showed marked interval decrease in the tumor in the left hepatic lobe.  She also had resolution of a subcapsular lesion in the left hepatic lobe.  Currently she is doing well aside from a cold sore. She request a rx for acyclovir  for which she has done well previously. She has had the outbreak for 2 days.   Of note, her last Chromogranin A level was 141.8  She has had no diarrhea.  She really needs to have the Somatuline done monthly.  She has had no fever.  She has had no bleeding.  She has had no rashes.  Overall, I will say that her performance status is probably ECOG 1.  Wt Readings from Last 3 Encounters:  01/20/24 177 lb (80.3 kg)  12/21/23 176 lb 12.8 oz (80.2 kg)  11/24/23 175 lb 9.6 oz (79.7 kg)    Medications:  Allergies as of 01/20/2024   No Known Allergies      Medication List        Accurate as of January 20, 2024 11:04 AM. If you have any questions, ask your nurse or doctor.          albendazole  200 MG tablet Commonly known as: ALBENZA  TAKE 2 TABLETS BY MOUTH TODAY AND REPEAT IN 2 WEEKS   albuterol  108 (90 Base) MCG/ACT inhaler Commonly known as: VENTOLIN  HFA Inhale 2 puffs into the lungs every 6 (six) hours as needed for wheezing or shortness of breath.   amLODipine  10 MG tablet Commonly known as: NORVASC  TAKE 1 TABLET BY MOUTH EVERY DAY   clonazePAM  0.5 MG tablet Commonly known as: KLONOPIN  TAKE 1 TABLET BY MOUTH  TWICE A DAY   docusate sodium  100 MG capsule Commonly known as: COLACE TAKE 1 CAPSULE BY MOUTH TWICE A DAY   fexofenadine  180 MG tablet Commonly known as: ALLEGRA  TAKE 1 TABLET BY MOUTH EVERY DAY   gabapentin  100 MG capsule Commonly known as: NEURONTIN  TAKE 1 CAPSULE BY MOUTH AT BEDTIME.   hydrOXYzine  10 MG tablet Commonly known as: ATARAX  Take 1 tablet (10 mg total) by mouth 3 (three) times daily as needed.   ibuprofen  800 MG tablet Commonly known as: ADVIL  Take 1 tablet (800 mg total) by mouth every 8 (eight) hours as needed for moderate pain (pain score 4-6) or cramping. Take with food.   metFORMIN  500 MG tablet Commonly known as: GLUCOPHAGE  Take 1 tablet (500 mg total) by mouth daily with breakfast.   methocarbamol  500 MG tablet Commonly known as: ROBAXIN  Take 1 tablet (500 mg total) by mouth every 6 (six) hours as needed for muscle spasms.   metoprolol  succinate 100 MG 24 hr tablet Commonly known as: TOPROL -XL TAKE 1 TABLET BY MOUTH EVERY DAY EVERY EVENING WITH OR IMMEDIATELY FOLLOWING A MEAL   montelukast  10 MG tablet Commonly known as: SINGULAIR  TAKE 1 TABLET BY MOUTH EVERYDAY AT BEDTIME   Mounjaro  2.5 MG/0.5ML Pen Generic drug: tirzepatide  INJECT 2.5MG  UNDER THE SKIN ONCE WEEKLY.  90 DAYS   octreotide  30 MG injection Commonly known as: SANDOSTATIN  LAR Inject 30 mg into the muscle every 28 (twenty-eight) days.   ondansetron  4 MG disintegrating tablet Commonly known as: ZOFRAN -ODT TAKE 1 TABLET BY MOUTH EVERY 8 HOURS AS NEEDED *INS MAX 18 TABS PER 21 DAYS*   ondansetron  8 MG tablet Commonly known as: ZOFRAN  Take 1 tablet (8 mg total) by mouth 2 (two) times daily as needed for nausea or vomiting.   pantoprazole  40 MG tablet Commonly known as: PROTONIX  TAKE 1 TABLET BY MOUTH TWICE A DAY   rosuvastatin  10 MG tablet Commonly known as: CRESTOR  TAKE 1 TABLET BY MOUTH EVERY DAY   scopolamine  1 MG/3DAYS Commonly known as: TRANSDERM-SCOP Place 1 patch (1.5  mg total) onto the skin every 3 (three) days.   valACYclovir  1000 MG tablet Commonly known as: VALTREX  Take 1,000 mg by mouth daily. What changed: how much to take   valsartan  320 MG tablet Commonly known as: DIOVAN  TAKE 1 TABLET BY MOUTH EVERY DAY   venlafaxine  XR 150 MG 24 hr capsule Commonly known as: EFFEXOR -XR TAKE 1 CAPSULE BY MOUTH EVERY DAY IN THE MORNING        Allergies: No Known Allergies  Past Medical History, Surgical history, Social history, and Family History were reviewed and updated.  Review of Systems: Review of Systems  Constitutional:  Positive for malaise/fatigue.  HENT: Negative.    Eyes: Negative.   Respiratory: Negative.    Cardiovascular: Negative.   Gastrointestinal: Negative.   Genitourinary: Negative.   Musculoskeletal: Negative.   Skin: Negative.   Neurological: Negative.   Endo/Heme/Allergies: Negative.   Psychiatric/Behavioral: Negative.       Physical Exam: Vitals:   01/20/24 1023  BP: (!) 158/84  Pulse: 76  Resp: 18  Temp: 99.1 F (37.3 C)  SpO2: 100%     Wt Readings from Last 3 Encounters:  01/20/24 177 lb (80.3 kg)  12/21/23 176 lb 12.8 oz (80.2 kg)  11/24/23 175 lb 9.6 oz (79.7 kg)    Physical Exam Vitals reviewed.  HENT:     Head: Normocephalic and atraumatic.     Mouth/Throat:     Comments: Sore of lower lip Eyes:     Pupils: Pupils are equal, round, and reactive to light.  Cardiovascular:     Rate and Rhythm: Normal rate and regular rhythm.     Heart sounds: Normal heart sounds.  Pulmonary:     Effort: Pulmonary effort is normal.     Breath sounds: Normal breath sounds.  Abdominal:     General: Bowel sounds are normal.     Palpations: Abdomen is soft.  Musculoskeletal:        General: No tenderness or deformity. Normal range of motion.     Cervical back: Normal range of motion.  Lymphadenopathy:     Cervical: No cervical adenopathy.  Skin:    General: Skin is warm and dry.     Findings: No  erythema or rash.  Neurological:     Mental Status: She is alert and oriented to person, place, and time.  Psychiatric:        Behavior: Behavior normal.        Thought Content: Thought content normal.        Judgment: Judgment normal.     Lab Results  Component Value Date   WBC 7.1 01/20/2024   HGB 11.4 (L) 01/20/2024   HCT 34.6 (L) 01/20/2024   MCV 98.6 01/20/2024   PLT  193 01/20/2024   Lab Results  Component Value Date   FERRITIN 382 (H) 10/18/2008   IRON <10 (L) 10/18/2008   TIBC NOT CALC Not calculated due to Iron <10. 10/18/2008   UIBC 156 10/18/2008   IRONPCTSAT NOT CALC Not calculated due to Iron <10. 10/18/2008   Lab Results  Component Value Date   RETICCTPCT 0.9 10/18/2008   RBC 3.51 (L) 01/20/2024   No results found for: KPAFRELGTCHN, LAMBDASER, KAPLAMBRATIO No results found for: IGGSERUM, IGA, IGMSERUM No results found for: STEPHANY CARLOTA BENSON MARKEL EARLA JOANNIE DOC VICK, SPEI   Chemistry      Component Value Date/Time   NA 140 01/20/2024 1015   NA 140 01/20/2017 1416   NA 141 01/17/2016 1047   K 5.2 (H) 01/20/2024 1015   K 3.9 01/20/2017 1416   K 3.2 (L) 01/17/2016 1047   CL 105 01/20/2024 1015   CL 102 01/20/2017 1416   CO2 25 01/20/2024 1015   CO2 26 01/20/2017 1416   CO2 27 01/17/2016 1047   BUN 16 01/20/2024 1015   BUN 15 01/20/2017 1416   BUN 6.6 (L) 01/17/2016 1047   CREATININE 0.97 01/20/2024 1015   CREATININE 1.1 01/20/2017 1416   CREATININE 0.7 01/17/2016 1047      Component Value Date/Time   CALCIUM  9.4 01/20/2024 1015   CALCIUM  9.1 01/20/2017 1416   CALCIUM  8.7 01/17/2016 1047   ALKPHOS 89 01/20/2024 1015   ALKPHOS 84 01/20/2017 1416   ALKPHOS 90 01/17/2016 1047   AST 17 01/20/2024 1015   AST 34 01/17/2016 1047   ALT 13 01/20/2024 1015   ALT 35 01/20/2017 1416   ALT 38 01/17/2016 1047   BILITOT 0.5 01/20/2024 1015   BILITOT 0.42 01/17/2016 1047     Encounter Diagnosis  Name  Primary?   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 [facility]) Yes   Impression and Plan: Ms. Leah [name] is a very pleasant 63 yo caucasian female with long standing history of metastatic carcinoid with liver metastasis.  She has completed all of the Lutathera  treatments.  The PET scan looks great.  So far, everything is done very nicely for her.  She will continue the Somatuline monthly.  This really does seem to help her quality of life.  Will have her come back [facility] for the Somatuline and labs in a month. Acyclovir  for her cold sore as requested    RTC 1 month injection RTC 2 months MD, labs, injection   Lauraine CHRISTELLA Dais, PA-C 12/10/202511:04 AM

## 2024-01-20 NOTE — Progress Notes (Signed)
 Somatuline given in left buttock, pt. Tolerated well.

## 2024-01-21 ENCOUNTER — Other Ambulatory Visit: Payer: Self-pay | Admitting: Physician Assistant

## 2024-01-21 DIAGNOSIS — J301 Allergic rhinitis due to pollen: Secondary | ICD-10-CM

## 2024-01-22 ENCOUNTER — Encounter: Payer: Self-pay | Admitting: *Deleted

## 2024-01-22 ENCOUNTER — Ambulatory Visit: Payer: Self-pay | Admitting: Hematology & Oncology

## 2024-01-22 LAB — CHROMOGRANIN A: Chromogranin A (ng/mL): 56.3 ng/mL (ref 0.0–101.8)

## 2024-01-26 ENCOUNTER — Ambulatory Visit: Admitting: Physician Assistant

## 2024-01-26 ENCOUNTER — Encounter: Payer: Self-pay | Admitting: Physician Assistant

## 2024-01-26 VITALS — BP 150/98 | HR 74 | Temp 97.2°F | Ht 59.0 in | Wt 175.2 lb

## 2024-01-26 DIAGNOSIS — I10 Essential (primary) hypertension: Secondary | ICD-10-CM

## 2024-01-26 DIAGNOSIS — N952 Postmenopausal atrophic vaginitis: Secondary | ICD-10-CM | POA: Diagnosis not present

## 2024-01-26 MED ORDER — ESTRADIOL 0.01 % VA CREA: TOPICAL_CREAM | VAGINAL | 0 refills | Status: AC

## 2024-01-26 NOTE — Progress Notes (Signed)
 Patient ID: Leah [name], female    DOB: [birth year], 63 y.o.   MRN: [id number]   Assessment & Plan:  Primary hypertension  Atrophic vaginitis -     Ambulatory referral to Gynecology  Other orders -     Estradiol ; 0.5 g intravaginally 1-3 times per week.  Dispense: 42.5 g; Refill: 0     Assessment & Plan Primary hypertension Blood pressure is elevated at 152/102 mmHg, likely due to recent use of DayQuil containing phenylephrine , which can increase blood pressure. Current medications include amlodipine , valsartan , and metoprolol . - Monitor blood pressure at home for a couple of weeks and report readings. - If home readings are consistently 140/90 mmHg or higher, will consider medication adjustment. - Avoid medications containing phenylephrine ; consider using Coricidin instead.  Postmenopausal atrophic vaginitis Experiencing vaginal dryness and discomfort, likely due to postmenopausal atrophic vaginitis. No recent use of estrogen therapy. Differential diagnosis includes lichen sclerosus or lichen planus, but itching is not present. Previous use of estrogen therapy was beneficial. Her oncologist has approved use of HRT per mychart note on 01/18/24. - Start estradiol  cream intravaginally, 1-2 times a week, increasing to 3 times as needed. - Referred to gynecology for further evaluation and management.     F/up as scheduled     Subjective:    Chief Complaint  Patient presents with   New Med Request    Wanting to talk about estrogen medication. Used to take something post hysterotomy. Wanting to go back in something again.     HPI Discussed the use of AI scribe software for clinical note transcription with the patient, who gave verbal consent to proceed.  History of Present Illness Leah [name] is a 63 year old female with hypertension who presents with elevated blood pressure.  She has elevated blood pressure, recorded at 152/102 mmHg during today's visit. She is  currently taking amlodipine  10 mg, valsartan  320 mg, and metoprolol  100 mg for hypertension. She believes her blood pressure may be elevated due to taking DayQuil and NyQuil over the weekend for a cold, which she feels affected her balance and made her feel unsteady.  She experiences symptoms related to menopause, including vaginal dryness and discomfort described as 'shards of glass.' She previously used estrogen therapy, which was beneficial, but discontinued it due to concerns. She is currently taking Effexor  XR. She has a history of hysterectomy and oophorectomy, and she does not have ovaries or a uterus.  She mentions a past involvement in a cancer ovarian study due to a family history of cancer, which led to the removal of her ovaries due to cysts. She does not currently have a gynecologist.     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   Cataract    Constipation    Diabetes (HCC)    Dyspnea    with exertion   Fatty liver    Gallbladder problem    GE reflux    Hepatic encephalopathy (HCC) 2011   History of PCR DNA positive for HSV1    History of retinal vein occlusion, right eye    HTN (hypertension) 2011   Joint pain    Liver tumor    Neuroendocrine tumor (HCC) 2011   Orthostatic hypotension    Osteoarthritis  Osteopenia    Palpitations    Sarcoidosis 2008   Sleep apnea    uses cpap   Surgical menopause     Past Surgical History:  Procedure Laterality Date   ABDOMINAL HYSTERECTOMY  2004   AUGMENTATION MAMMAPLASTY     BREAST BIOPSY Left    BREAST RECONSTRUCTION Bilateral 05/07/2020   Procedure: Revision of bilateral breast reconstruction;  Surgeon: Elisabeth Craig RAMAN, MD;  Location: [facility];  Service: Plastics;  Laterality: Bilateral;  2 hours   CESAREAN SECTION  1999,2001    CHOLECYSTECTOMY  2006   COSMETIC SURGERY     Breast reconstruction   EYE SURGERY     Lasik   HERNIA REPAIR     JOINT REPLACEMENT     Left knee replacement   KNEE ARTHROSCOPY Left 09/2010   LAPAROSCOPIC HEPATECTOMY     LIPOSUCTION Bilateral 05/07/2020   Procedure: LIPOSUCTION with excision of lateral breast and axillary tissue;  Surgeon: Elisabeth Craig RAMAN, MD;  Location: [facility];  Service: Plastics;  Laterality: Bilateral;   LIVER SURGERY     MASTECTOMY Bilateral 2008   NASAL SINUS SURGERY     OOPHORECTOMY  2004   RECONSTRUCTION BREAST W/ LATISSIMUS DORSI FLAP Bilateral    TONSILLECTOMY  1976   TOTAL KNEE ARTHROPLASTY Left 10/29/2020   Procedure: TOTAL KNEE ARTHROPLASTY;  Surgeon: Melodi Lerner, MD;  Location: [facility];  Service: Orthopedics;  Laterality: Left;   TOTAL KNEE ARTHROPLASTY Right 04/14/2022   Procedure: TOTAL KNEE ARTHROPLASTY;  Surgeon: Melodi Lerner, MD;  Location: [facility];  Service: Orthopedics;  Laterality: Right;   TUBAL LIGATION Bilateral 2001    Family History  Problem Relation Age of Onset   Breast cancer Mother 96   Lupus Mother    Heart failure Father    Hypertension Father    Hyperlipidemia Father    Sudden death Father    Obesity Father    Breast cancer Maternal Aunt 39   Ovarian cancer Maternal Aunt 60   Breast cancer Maternal Aunt 40   Arthritis Maternal Grandmother    Arthritis Maternal Grandfather    Arthritis Paternal Grandmother    Arthritis Paternal Grandfather    Heart failure Paternal Grandfather    Colon polyps Neg Hx    Colon cancer Neg Hx    Esophageal cancer Neg Hx    Stomach cancer Neg Hx    Rectal cancer Neg Hx     Social History[1]   Allergies[2]  Review of Systems NEGATIVE UNLESS OTHERWISE INDICATED IN HPI      Objective:     BP (!) 150/98 (BP Location: Right Wrist, Patient Position: Sitting, Cuff Size: Normal)   Pulse 74   Temp (!) 97.2 F (36.2 C) (Temporal)   Ht 4' 11 (1.499 m)   Wt 175 lb  3.2 oz (79.5 kg)   SpO2 99%   BMI 35.39 kg/m   Wt Readings from Last 3 Encounters:  01/26/24 175 lb 3.2 oz (79.5 kg)  01/20/24 177 lb (80.3 kg)  12/21/23 176 lb 12.8 oz (80.2 kg)    BP Readings from Last 3 Encounters:  01/26/24 (!) 150/98  01/20/24 (!) 158/84  12/21/23 (!) 151/77     Physical Exam Vitals and nursing note reviewed.  Constitutional:      General: She is not in acute distress.    Appearance: Normal appearance. She is obese. She is not ill-appearing.  HENT:     Head: Normocephalic.     Right  Ear: External ear normal.     Left Ear: External ear normal.     Nose: No congestion.  Eyes:     Extraocular Movements: Extraocular movements intact.     Conjunctiva/sclera: Conjunctivae normal.     Pupils: Pupils are equal, round, and reactive to light.  Cardiovascular:     Rate and Rhythm: Normal rate and regular rhythm.     Pulses: Normal pulses.     Heart sounds: Normal heart sounds. No murmur heard. Pulmonary:     Effort: Pulmonary effort is normal. No respiratory distress.     Breath sounds: Normal breath sounds. No wheezing.  Musculoskeletal:     Cervical back: Normal range of motion.  Skin:    General: Skin is warm.  Neurological:     Mental Status: She is alert and oriented to person, place, and time.  Psychiatric:        Mood and Affect: Mood normal.        Behavior: Behavior normal.             Ameira Alessandrini M Jensyn Shave, PA-C     [1]  Social History Tobacco Use   Smoking status: Never   Smokeless tobacco: Never   Tobacco comments:    never used tobacco  Vaping Use   Vaping status: Never Used  Substance Use Topics   Alcohol use: No    Alcohol/week: 0.0 standard drinks of alcohol   Drug use: No  [2] No Known Allergies

## 2024-01-26 NOTE — Patient Instructions (Signed)
°  VISIT SUMMARY: During your visit, we discussed your elevated blood pressure and symptoms related to menopause. We reviewed your current medications and made some adjustments to help manage your conditions.  YOUR PLAN: PRIMARY HYPERTENSION: Your blood pressure was elevated, likely due to recent use of DayQuil containing phenylephrine . -Monitor your blood pressure at home for a couple of weeks and report the readings. -If your home readings are consistently 140/90 mmHg or higher, we may need to adjust your medication. -Avoid medications containing phenylephrine ; consider using Coricidin instead.  POSTMENOPAUSAL ATROPHIC VAGINITIS: You are experiencing vaginal dryness and discomfort, likely due to postmenopausal atrophic vaginitis. -Start using estradiol  cream intravaginally, 1-2 times a week, increasing to 3 times as needed. -You have been referred to gynecology for further evaluation and management.                      Contains text generated by Abridge.                                 Contains text generated by Abridge.

## 2024-01-28 LAB — OPHTHALMOLOGY REPORT-SCANNED

## 2024-02-03 ENCOUNTER — Other Ambulatory Visit: Payer: Self-pay | Admitting: Physician Assistant

## 2024-02-03 DIAGNOSIS — R110 Nausea: Secondary | ICD-10-CM

## 2024-02-15 ENCOUNTER — Ambulatory Visit: Admitting: Physician Assistant

## 2024-02-19 ENCOUNTER — Inpatient Hospital Stay: Attending: Hematology & Oncology

## 2024-02-19 ENCOUNTER — Encounter: Payer: Self-pay | Admitting: Family

## 2024-02-19 ENCOUNTER — Other Ambulatory Visit: Payer: Self-pay | Admitting: *Deleted

## 2024-02-19 VITALS — BP 146/91 | HR 62 | Temp 97.9°F | Resp 16 | Wt 176.0 lb

## 2024-02-19 DIAGNOSIS — C7B8 Other secondary neuroendocrine tumors: Secondary | ICD-10-CM

## 2024-02-19 DIAGNOSIS — C7A00 Malignant carcinoid tumor of unspecified site: Secondary | ICD-10-CM | POA: Diagnosis present

## 2024-02-19 DIAGNOSIS — C7B02 Secondary carcinoid tumors of liver: Secondary | ICD-10-CM | POA: Insufficient documentation

## 2024-02-19 LAB — COMPREHENSIVE METABOLIC PANEL WITH GFR
ALT: 10 U/L (ref 0–44)
AST: 15 U/L (ref 15–41)
Albumin: 4.2 g/dL (ref 3.5–5.0)
Alkaline Phosphatase: 97 U/L (ref 38–126)
Anion gap: 11 (ref 5–15)
BUN: 14 mg/dL (ref 8–23)
CO2: 23 mmol/L (ref 22–32)
Calcium: 9 mg/dL (ref 8.9–10.3)
Chloride: 105 mmol/L (ref 98–111)
Creatinine, Ser: 0.76 mg/dL (ref 0.44–1.00)
GFR, Estimated: >60 mL/min
Glucose, Bld: 104 mg/dL — ABNORMAL HIGH (ref 70–99)
Potassium: 3.9 mmol/L (ref 3.5–5.1)
Sodium: 139 mmol/L (ref 135–145)
Total Bilirubin: 0.5 mg/dL (ref 0.0–1.2)
Total Protein: 7.2 g/dL (ref 6.5–8.1)

## 2024-02-19 LAB — CBC WITH DIFFERENTIAL (CANCER CENTER ONLY)
Abs Immature Granulocytes: 0.01 K/uL (ref 0.00–0.07)
Basophils Absolute: 0 K/uL (ref 0.0–0.1)
Basophils Relative: 0 %
Eosinophils Absolute: 0.1 K/uL (ref 0.0–0.5)
Eosinophils Relative: 2 %
HCT: 33.4 % — ABNORMAL LOW (ref 36.0–46.0)
Hemoglobin: 11.4 g/dL — ABNORMAL LOW (ref 12.0–15.0)
Immature Granulocytes: 0 %
Lymphocytes Relative: 19 %
Lymphs Abs: 1.3 K/uL (ref 0.7–4.0)
MCH: 32.5 pg (ref 26.0–34.0)
MCHC: 34.1 g/dL (ref 30.0–36.0)
MCV: 95.2 fL (ref 80.0–100.0)
Monocytes Absolute: 0.5 K/uL (ref 0.1–1.0)
Monocytes Relative: 8 %
Neutro Abs: 4.6 K/uL (ref 1.7–7.7)
Neutrophils Relative %: 71 %
Platelet Count: 174 K/uL (ref 150–400)
RBC: 3.51 MIL/uL — ABNORMAL LOW (ref 3.87–5.11)
RDW: 12.7 % (ref 11.5–15.5)
WBC Count: 6.5 K/uL (ref 4.0–10.5)
nRBC: 0 % (ref 0.0–0.2)

## 2024-02-19 MED ORDER — LANREOTIDE ACETATE 120 MG/0.5ML ~~LOC~~ SOLN
120.0000 mg | Freq: Once | SUBCUTANEOUS | Status: AC
  Administered 2024-02-19: 120 mg via SUBCUTANEOUS
  Filled 2024-02-19: qty 0.5

## 2024-02-19 NOTE — Patient Instructions (Signed)
 Lanreotide Injection What is this medication? LANREOTIDE (lan REE oh tide) treats high levels of growth hormone (acromegaly). It is used when other therapies have not worked well enough or cannot be tolerated. It works by reducing the amount of growth hormone your body makes. This reduces symptoms and the risk of health problems caused by too much growth hormone, such as diabetes and heart disease. It may also be used to treat neuroendocrine tumors, a cancer of the cells that release hormones and other substances in your body. It works by slowing down the release of these substances from the cells. This slows tumor growth. It also decreases the symptoms of carcinoid syndrome, such as flushing or diarrhea. This medicine may be used for other purposes; ask your health care provider or pharmacist if you have questions. COMMON BRAND NAME(S): Somatuline [place] What should I tell my care team before I take this medication? They need to know if you have any of these conditions: Diabetes Gallbladder disease Heart disease Kidney disease Liver disease Pancreatic disease Thyroid disease An unusual or allergic reaction to lanreotide, other medications, foods, dyes, or preservatives Pregnant or trying to get pregnant Breastfeeding How should I use this medication? This medication is injected under the skin. It is given by your care team in a hospital or clinic setting. Talk to your care team about the use of this medication in children. Special care may be needed. Overdosage: If you think you have taken too much of this medicine contact a poison control center or emergency room at once. NOTE: This medicine is only for you. Do not share this medicine with others. What if I miss a dose? Keep appointments for follow-up doses. It is important not to miss your dose. Call your care team if you are unable to keep an appointment. What may interact with this medication? Bromocriptine Cyclosporine Certain  medications for blood pressure, heart disease, irregular heartbeat Certain medications for diabetes Quinidine Terfenadine This list may not describe all possible interactions. Give your health care provider a list of all the medicines, herbs, non-prescription drugs, or dietary supplements you use. Also tell them if you smoke, drink alcohol, or use illegal drugs. Some items may interact with your medicine. What should I watch for while using this medication? Visit your care team for regular checks on your progress. Tell your care team if your symptoms do not start to get better or if they get worse. Your condition will be monitored carefully while you are receiving this medication. You may need blood work while you are taking this medication. This medication may increase blood sugar. The risk may be higher in patients who already have diabetes. Ask your care team what you can do to lower your risk of diabetes while taking this medication. Talk to your care team if you wish to become pregnant or think you may be pregnant. This medication can cause serious birth defects. Do not breast-feed while taking this medication and for 6 months after stopping therapy. This medication may cause infertility. Talk to your care team if you are concerned about your fertility. What side effects may I notice from receiving this medication? Side effects that you should report to your care team as soon as possible: Allergic reactions--skin rash, itching, hives, swelling of the face, lips, tongue, or throat Gallbladder problems--severe stomach pain, nausea, vomiting, fever High blood sugar (hyperglycemia)--increased thirst or amount of urine, unusual weakness or fatigue, blurry vision Increase in blood pressure Low blood sugar (hypoglycemia)--pale, blue or purple  skin or lips, sweating, fussiness, rapid heartbeat, poor feeding, low body temperature Low thyroid levels (hypothyroidism)--unusual weakness or fatigue,  increased sensitivity to cold, constipation, hair loss, dry skin, weight gain, feelings of depression Oily or light-colored stools, diarrhea, bloating, weight loss Slow heartbeat--dizziness, feeling faint or lightheaded, confusion, trouble breathing, unusual weakness or fatigue Side effects that usually do not require medical attention (report these to your care team if they continue or are bothersome): Diarrhea Dizziness Headache Muscle spasms Nausea Pain, redness, or irritation at injection site Stomach pain This list may not describe all possible side effects. Call your doctor for medical advice about side effects. You may report side effects to FDA at 1-800-FDA-1088. Where should I keep my medication? This medication is given in a hospital or clinic. It will not be stored at home. NOTE: This sheet is a summary. It may not cover all possible information. If you have questions about this medicine, talk to your doctor, pharmacist, or health care provider.  2024 Elsevier/Gold Standard (2023-01-09 00:00:00)

## 2024-02-22 LAB — CHROMOGRANIN A: Chromogranin A (ng/mL): 82 ng/mL (ref 0.0–101.8)

## 2024-02-24 ENCOUNTER — Other Ambulatory Visit: Payer: Self-pay | Admitting: Hematology & Oncology

## 2024-03-10 ENCOUNTER — Other Ambulatory Visit: Payer: Self-pay | Admitting: Physician Assistant

## 2024-03-22 ENCOUNTER — Inpatient Hospital Stay: Admitting: Family

## 2024-03-22 ENCOUNTER — Inpatient Hospital Stay: Attending: Hematology & Oncology

## 2024-03-22 ENCOUNTER — Inpatient Hospital Stay

## 2024-03-28 ENCOUNTER — Ambulatory Visit: Admitting: Physician Assistant
